# Patient Record
Sex: Female | Born: 1990 | Race: White | Hispanic: No | Marital: Married | Smoking: Never smoker
Health system: Southern US, Community
[De-identification: ages and names within clinical notes are randomized; demographics above are authoritative.]

## PROBLEM LIST (undated history)

## (undated) ENCOUNTER — Inpatient Hospital Stay (HOSPITAL_COMMUNITY): Payer: Self-pay

## (undated) DIAGNOSIS — M726 Necrotizing fasciitis: Secondary | ICD-10-CM

## (undated) DIAGNOSIS — E2749 Other adrenocortical insufficiency: Secondary | ICD-10-CM

## (undated) DIAGNOSIS — Z9151 Personal history of suicidal behavior: Secondary | ICD-10-CM

## (undated) DIAGNOSIS — Z8659 Personal history of other mental and behavioral disorders: Secondary | ICD-10-CM

## (undated) DIAGNOSIS — G43909 Migraine, unspecified, not intractable, without status migrainosus: Secondary | ICD-10-CM

## (undated) DIAGNOSIS — N3010 Interstitial cystitis (chronic) without hematuria: Secondary | ICD-10-CM

## (undated) DIAGNOSIS — F32A Depression, unspecified: Secondary | ICD-10-CM

## (undated) DIAGNOSIS — N390 Urinary tract infection, site not specified: Secondary | ICD-10-CM

## (undated) DIAGNOSIS — M797 Fibromyalgia: Secondary | ICD-10-CM

## (undated) DIAGNOSIS — F4310 Post-traumatic stress disorder, unspecified: Secondary | ICD-10-CM

## (undated) DIAGNOSIS — I10 Essential (primary) hypertension: Secondary | ICD-10-CM

## (undated) DIAGNOSIS — G4700 Insomnia, unspecified: Secondary | ICD-10-CM

## (undated) DIAGNOSIS — N200 Calculus of kidney: Secondary | ICD-10-CM

## (undated) DIAGNOSIS — J45909 Unspecified asthma, uncomplicated: Secondary | ICD-10-CM

## (undated) DIAGNOSIS — E063 Autoimmune thyroiditis: Secondary | ICD-10-CM

## (undated) DIAGNOSIS — F419 Anxiety disorder, unspecified: Secondary | ICD-10-CM

## (undated) HISTORY — DX: Other adrenocortical insufficiency: E27.49

## (undated) HISTORY — DX: Depression, unspecified: F32.A

## (undated) HISTORY — PX: NASAL SEPTUM SURGERY: SHX37

## (undated) HISTORY — DX: Post-traumatic stress disorder, unspecified: F43.10

## (undated) HISTORY — DX: Insomnia, unspecified: G47.00

## (undated) HISTORY — DX: Personal history of suicidal behavior: Z91.51

## (undated) HISTORY — PX: INCISION AND DRAINAGE: SHX5863

## (undated) HISTORY — PX: OTHER SURGICAL HISTORY: SHX169

## (undated) HISTORY — DX: Personal history of other mental and behavioral disorders: Z86.59

## (undated) HISTORY — DX: Fibromyalgia: M79.7

## (undated) HISTORY — PX: APPENDECTOMY: SHX54

## (undated) HISTORY — PX: HAND RECONSTRUCTION: SHX1730

---

## 2015-10-12 ENCOUNTER — Other Ambulatory Visit: Payer: Self-pay | Admitting: Family Medicine

## 2015-10-12 ENCOUNTER — Ambulatory Visit
Admission: RE | Admit: 2015-10-12 | Discharge: 2015-10-12 | Disposition: A | Discharge status: Home / Self Care | Payer: BLUE CROSS/BLUE SHIELD | Source: Ambulatory Visit | Attending: Family Medicine | Admitting: Family Medicine

## 2015-10-12 DIAGNOSIS — R319 Hematuria, unspecified: Secondary | ICD-10-CM

## 2015-10-12 DIAGNOSIS — Z87442 Personal history of urinary calculi: Secondary | ICD-10-CM

## 2015-11-06 ENCOUNTER — Emergency Department (HOSPITAL_COMMUNITY): Payer: BLUE CROSS/BLUE SHIELD

## 2015-11-06 ENCOUNTER — Emergency Department (HOSPITAL_COMMUNITY)
Admission: EM | Admit: 2015-11-06 | Discharge: 2015-11-06 | Disposition: A | Discharge status: Home / Self Care | Payer: BLUE CROSS/BLUE SHIELD | Attending: Emergency Medicine | Admitting: Emergency Medicine

## 2015-11-06 ENCOUNTER — Encounter (HOSPITAL_COMMUNITY): Payer: Self-pay | Admitting: Emergency Medicine

## 2015-11-06 DIAGNOSIS — I10 Essential (primary) hypertension: Secondary | ICD-10-CM | POA: Insufficient documentation

## 2015-11-06 DIAGNOSIS — R300 Dysuria: Secondary | ICD-10-CM

## 2015-11-06 DIAGNOSIS — R102 Pelvic and perineal pain: Secondary | ICD-10-CM | POA: Insufficient documentation

## 2015-11-06 DIAGNOSIS — J45909 Unspecified asthma, uncomplicated: Secondary | ICD-10-CM | POA: Insufficient documentation

## 2015-11-06 HISTORY — DX: Essential (primary) hypertension: I10

## 2015-11-06 HISTORY — DX: Urinary tract infection, site not specified: N39.0

## 2015-11-06 HISTORY — DX: Interstitial cystitis (chronic) without hematuria: N30.10

## 2015-11-06 HISTORY — DX: Migraine, unspecified, not intractable, without status migrainosus: G43.909

## 2015-11-06 HISTORY — DX: Anxiety disorder, unspecified: F41.9

## 2015-11-06 HISTORY — DX: Calculus of kidney: N20.0

## 2015-11-06 HISTORY — DX: Unspecified asthma, uncomplicated: J45.909

## 2015-11-06 LAB — URINALYSIS, ROUTINE W REFLEX MICROSCOPIC
Bilirubin Urine: NEGATIVE
Glucose, UA: NEGATIVE mg/dL
Hgb urine dipstick: NEGATIVE
Ketones, ur: NEGATIVE mg/dL
Leukocytes, UA: NEGATIVE
Nitrite: NEGATIVE
Protein, ur: NEGATIVE mg/dL
Specific Gravity, Urine: 1.01 (ref 1.005–1.030)
pH: 8 (ref 5.0–8.0)

## 2015-11-06 LAB — CBC WITH DIFFERENTIAL/PLATELET
Basophils Absolute: 0 10*3/uL (ref 0.0–0.1)
Basophils Relative: 0 %
Eosinophils Absolute: 0.2 10*3/uL (ref 0.0–0.7)
Eosinophils Relative: 4 %
HCT: 41.8 % (ref 36.0–46.0)
Hemoglobin: 14.5 g/dL (ref 12.0–15.0)
Lymphocytes Relative: 31 %
Lymphs Abs: 1.8 10*3/uL (ref 0.7–4.0)
MCH: 32.4 pg (ref 26.0–34.0)
MCHC: 34.7 g/dL (ref 30.0–36.0)
MCV: 93.5 fL (ref 78.0–100.0)
Monocytes Absolute: 0.6 10*3/uL (ref 0.1–1.0)
Monocytes Relative: 11 %
Neutro Abs: 3 10*3/uL (ref 1.7–7.7)
Neutrophils Relative %: 54 %
Platelets: 280 10*3/uL (ref 150–400)
RBC: 4.47 MIL/uL (ref 3.87–5.11)
RDW: 12.4 % (ref 11.5–15.5)
WBC: 5.6 10*3/uL (ref 4.0–10.5)

## 2015-11-06 LAB — COMPREHENSIVE METABOLIC PANEL
ALT: 15 U/L (ref 14–54)
AST: 25 U/L (ref 15–41)
Albumin: 4.3 g/dL (ref 3.5–5.0)
Alkaline Phosphatase: 50 U/L (ref 38–126)
Anion gap: 8 (ref 5–15)
BUN: 9 mg/dL (ref 6–20)
CO2: 26 mmol/L (ref 22–32)
Calcium: 9.5 mg/dL (ref 8.9–10.3)
Chloride: 107 mmol/L (ref 101–111)
Creatinine, Ser: 0.69 mg/dL (ref 0.44–1.00)
GFR calc Af Amer: >60 mL/min (ref >60)
GFR calc non Af Amer: >60 mL/min (ref >60)
Glucose, Bld: 91 mg/dL (ref 65–99)
Potassium: 3.9 mmol/L (ref 3.5–5.1)
Sodium: 141 mmol/L (ref 135–145)
Total Bilirubin: 0.9 mg/dL (ref 0.3–1.2)
Total Protein: 6.6 g/dL (ref 6.5–8.1)

## 2015-11-06 LAB — POC URINE PREG, ED: Preg Test, Ur: NEGATIVE

## 2015-11-06 LAB — LIPASE, BLOOD: Lipase: 24 U/L (ref 11–51)

## 2015-11-06 MED ORDER — DIPHENHYDRAMINE HCL 50 MG/ML IJ SOLN
25.0000 mg | Freq: Once | INTRAMUSCULAR | Status: AC
  Administered 2015-11-06: 25 mg via INTRAVENOUS
  Filled 2015-11-06: qty 1

## 2015-11-06 MED ORDER — HYDROMORPHONE HCL 1 MG/ML IJ SOLN
1.0000 mg | Freq: Once | INTRAMUSCULAR | Status: AC
  Administered 2015-11-06: 1 mg via INTRAVENOUS
  Filled 2015-11-06: qty 1

## 2015-11-06 MED ORDER — PHENAZOPYRIDINE HCL 100 MG PO TABS
200.0000 mg | ORAL_TABLET | Freq: Once | ORAL | Status: DC
  Filled 2015-11-06: qty 2

## 2015-11-06 MED ORDER — METOCLOPRAMIDE HCL 5 MG/ML IJ SOLN
10.0000 mg | Freq: Once | INTRAMUSCULAR | Status: AC
  Administered 2015-11-06: 10 mg via INTRAVENOUS
  Filled 2015-11-06: qty 2

## 2015-11-06 MED ORDER — SODIUM CHLORIDE 0.9 % IV SOLN
INTRAVENOUS | Status: DC
  Administered 2015-11-06: 19:00:00 via INTRAVENOUS

## 2015-11-06 MED ORDER — OXYCODONE-ACETAMINOPHEN 5-325 MG PO TABS: 1.0000 | ORAL_TABLET | Freq: Four times a day (QID) | ORAL | 0 refills | Status: DC | PRN

## 2015-11-06 NOTE — ED Notes (Signed)
IV team at bedside 

## 2015-11-06 NOTE — ED Notes (Signed)
Pt returned to room from ultrasound, IV team notified

## 2015-11-06 NOTE — Discharge Instructions (Signed)
All of your tests tonight were normal. You will need to follow up with the urologist as soon as possible. Call your PCP tomorrow to discuss follow up.

## 2015-11-06 NOTE — ED Notes (Signed)
IV attempted x1 without success, will have another RN look

## 2015-11-06 NOTE — ED Provider Notes (Signed)
[facility] DEPT Provider Note   CSN: [id number] Arrival date & time: 11/06/15  1620  By signing my name below, I, Krystal [name], attest that this documentation has been prepared under the direction and in the presence of non-physician practitioner, Debroah Baller, NP. Electronically Signed: Jeanell [name], Scribe. 11/06/2015. 4:39 PM  History   Chief Complaint Chief Complaint  Patient presents with  . Dysuria   The history is provided by the patient. No language interpreter was used.  Dysuria   This is a new problem. The current episode started more than 1 week ago. The problem occurs every urination. The problem has been gradually worsening. The quality of the pain is described as burning. The pain is moderate. There has been no fever. Associated symptoms include chills, nausea, vomiting, discharge, frequency and urgency. Pertinent negatives include no possible pregnancy. She has tried nothing for the symptoms.   HPI Comments: Krystal [name] is a 25 y.o. female who presents to the Emergency Department complaining of constant moderate dysuria that started over a month ago. Her worsening symptoms include a burning sensation during urination, intermittent vaginal pain worse after urination, frequency, and urgency. Other associated symptoms include white vaginal discharge, lower abdominal pain, intermittent back pain, nausea, vomiting, and chills. She was dx with interstitial cystitis a month ago at [facility] and is suppose to follow up with a specialist. Reports having a miscarriage a month ago. Denies fever or current pregnancy.   Past Medical History:  Diagnosis Date  . Anxiety   . Asthma   . Hypertension   . IC (interstitial cystitis)   . Kidney stones   . Migraines   . UTI (urinary tract infection)     There are no active problems to display for this patient.   Past Surgical History:  Procedure Laterality Date  . APPENDECTOMY    . 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 HAND SURGERY     right  . NASAL SEPTUM SURGERY      OB History    No data available       Home Medications    Prior to Admission medications   Medication Sig Start Date End Date Taking? Authorizing Provider  ALPRAZolam Duanne Moron) 1 MG tablet Take 0.5-1 mg by mouth 2 (two) times daily as needed for anxiety.   Yes Historical Provider, MD  amLODipine (NORVASC) 10 MG tablet Take 5 mg by mouth at bedtime.   Yes Historical Provider, MD  gabapentin (NEURONTIN) 300 MG capsule Take 300 mg by mouth at bedtime.   Yes Historical Provider, MD  hydrochlorothiazide (HYDRODIURIL) 25 MG tablet Take 25 mg by mouth every evening.   Yes Historical Provider, MD  ondansetron (ZOFRAN) 8 MG tablet Take 8 mg by mouth every 8 (eight) hours as needed for nausea or vomiting.   Yes Historical Provider, MD  prenatal vitamin w/FE, FA (PRENATAL 1 + 1) 27-1 MG TABS tablet Take 1 tablet by mouth daily at 12 noon.   Yes Historical Provider, MD  sertraline (ZOLOFT) 100 MG tablet Take 200 mg by mouth at bedtime.   Yes Historical Provider, MD  oxyCODONE-acetaminophen (PERCOCET/ROXICET) 5-325 MG tablet Take 1 tablet by mouth every 6 (six) hours as needed for severe pain. 11/06/15   [name], NP  pentosan polysulfate (ELMIRON) 100 MG capsule Take 100 mg by mouth 3 (three) times daily.    Historical Provider, MD    Family History History reviewed. No pertinent family history.  Social History Social History  Substance Use Topics  . Smoking status: Never Smoker  . Smokeless tobacco: Never Used  . Alcohol use Yes     Comment: bottle of wine a week     Allergies   Red dye; Compazine [prochlorperazine maleate]; Other; Vicodin [hydrocodone-acetaminophen]; and Wheat bran   Review of Systems Review of Systems  Constitutional: Positive for chills. Negative for fever.  Gastrointestinal: Positive for abdominal pain, nausea and vomiting.  Genitourinary: Positive for dysuria, frequency, urgency and  vaginal discharge (White).  Musculoskeletal: Positive for back pain.  All other systems reviewed and are negative.    Physical Exam Updated Vital Signs BP 132/86 (BP Location: Right Arm)   Pulse 68   Temp 98 F (36.7 C) (Oral)   Resp 18   Ht 5\' 11"  (1.803 m)   Wt 185 lb (83.9 kg)   LMP 10/10/2015   SpO2 99%   BMI 25.80 kg/m   Physical Exam  Constitutional: She appears well-developed and well-nourished. No distress.  HENT:  Head: Normocephalic.  Eyes: Conjunctivae are normal.  Neck: Neck supple.  Cardiovascular: Normal rate and regular rhythm.   Pulmonary/Chest: Effort normal. No respiratory distress. She has no wheezes. She has no rales.  Abdominal: Soft. Bowel sounds are normal. There is tenderness.  Generalized TTP that is increased in the LLQ. No CVA TTP.   Genitourinary:  Genitourinary Comments: Pt declines pelvic exam at this time.   Musculoskeletal: Normal range of motion.  Neurological: She is alert.  Skin: Skin is warm and dry.  Psychiatric: She has a normal mood and affect.  Nursing note and vitals reviewed.    ED Treatments / Results  DIAGNOSTIC STUDIES: Oxygen Saturation is 99% on RA, normal by my interpretation.   COORDINATION OF CARE: 4:44 PM- Pt advised of plan for treatment and pt agrees.  Labs (all labs ordered are listed, but only abnormal results are displayed) Labs Reviewed  WET PREP, GENITAL  URINALYSIS, ROUTINE W REFLEX MICROSCOPIC (NOT AT [facility])  CBC WITH DIFFERENTIAL/PLATELET  COMPREHENSIVE METABOLIC PANEL  LIPASE, BLOOD  POC URINE PREG, ED  GC/CHLAMYDIA PROBE AMP ([place]) NOT AT [facility]    Radiology US Transvaginal Non-ob  Result Date: 11/06/2015 CLINICAL DATA:  25 y/o F; lower back pain with nausea vomiting and diarrhea for 1 month. Negative pregnancy test. EXAM: TRANSABDOMINAL AND TRANSVAGINAL ULTRASOUND OF PELVIS TECHNIQUE: Both transabdominal and transvaginal ultrasound examinations of the pelvis were performed. Transabdominal  technique was performed for global imaging of the pelvis including uterus, ovaries, adnexal regions, and pelvic cul-de-sac. It was necessary to proceed with endovaginal exam following the transabdominal exam to visualize the bilateral ovaries. COMPARISON:  10/12/2015 CT of abdomen and pelvis. FINDINGS: Uterus Measurements: 4.7 x 3.3 x 4.5 cm. No fibroids or other mass visualized. Endometrium Thickness: 3 mm.  No focal abnormality visualized. Right ovary Measurements: 2.4 x 2.1 x 2.1 cm. Benign-appearing follicles. Left ovary Measurements: 2.3 x 3.1 x 2.3 cm. Benign-appearing follicles. Other findings Trace physiologic free fluid. IMPRESSION: Normal pelvic and transvaginal ultrasound for age. Electronically Signed   By: Kristine Garbe M.D.   On: 11/06/2015 18:32   US Pelvis Complete  Result Date: 11/06/2015 CLINICAL DATA:  25 y/o F; lower back pain with nausea vomiting and diarrhea for 1 month. Negative pregnancy test. EXAM: TRANSABDOMINAL AND TRANSVAGINAL ULTRASOUND OF PELVIS TECHNIQUE: Both transabdominal and transvaginal ultrasound examinations of the pelvis were performed. Transabdominal technique was performed for global imaging of the pelvis including uterus, ovaries, adnexal regions,  and pelvic cul-de-sac. It was necessary to proceed with endovaginal exam following the transabdominal exam to visualize the bilateral ovaries. COMPARISON:  10/12/2015 CT of abdomen and pelvis. FINDINGS: Uterus Measurements: 4.7 x 3.3 x 4.5 cm. No fibroids or other mass visualized. Endometrium Thickness: 3 mm.  No focal abnormality visualized. Right ovary Measurements: 2.4 x 2.1 x 2.1 cm. Benign-appearing follicles. Left ovary Measurements: 2.3 x 3.1 x 2.3 cm. Benign-appearing follicles. Other findings Trace physiologic free fluid. IMPRESSION: Normal pelvic and transvaginal ultrasound for age. Electronically Signed   By: Kristine Garbe M.D.   On: 11/06/2015 18:32   US Renal  Result Date:  11/06/2015 CLINICAL DATA:  25 y/o F; increased urinary frequency for 1 month with burning. EXAM: RENAL / URINARY TRACT ULTRASOUND COMPLETE COMPARISON:  10/12/2015 CT of abdomen and pelvis. FINDINGS: Right Kidney: Length: 11.8 cm. Echogenicity within normal limits. No mass or hydronephrosis visualized. 6 mm lesion described on prior CT is not identified on ultrasound. Left Kidney: Length: 11.3 cm. Echogenicity within normal limits. No mass or hydronephrosis visualized. Mild lobulation of contour. Bladder: Appears normal for degree of bladder distention. IMPRESSION: Normal renal ultrasound. Electronically Signed   By: Kristine Garbe M.D.   On: 11/06/2015 18:30    Procedures Procedures (including critical care time)  Medications Ordered in ED Medications  metoCLOPramide (REGLAN) injection 10 mg (10 mg Intravenous Given 11/06/15 1918)  HYDROmorphone (DILAUDID) injection 1 mg (1 mg Intravenous Given 11/06/15 2037)  diphenhydrAMINE (BENADRYL) injection 25 mg (25 mg Intravenous Given 11/06/15 2146)     Initial Impression / Assessment and Plan / ED Course  I have reviewed the triage vital signs and the nursing notes.  Pertinent labs & imaging results that were available during my care of the patient were reviewed by me and considered in my medical decision making (see chart for details).  Clinical Course    Final Clinical Impressions(s) / ED Diagnoses  25 y.o. female with chronic pelvic pain and hx of interstitial cystitis stable for d/c without fever and does not appear toxic. Normal urine and no concern for pyelonephritis or kidney stones at this time. Patient will f/u with with her PCP in the am to discuss further evaluation by urology. Pain under control at d/c.   Final diagnoses:  Pelvic pain in female  Dysuria    New Prescriptions Discharge Medication List as of 11/06/2015  9:49 PM    START taking these medications   Details  oxyCODONE-acetaminophen (PERCOCET/ROXICET) 5-325 MG  tablet Take 1 tablet by mouth every 6 (six) hours as needed for severe pain., Starting Tue 11/06/2015, Print       I personally performed the services described in this documentation, which was scribed in my presence. The recorded information has been reviewed and is accurate.     [address] [place], [place] 11/08/15 [place], MD 11/09/15 [phone]

## 2015-11-06 NOTE — ED Triage Notes (Signed)
Pt from home with c/o possible interstitial cystitis x 1 month with burning during urination and abdominal pain.  Pt reports she was seen by her PCP and treated for UTI but pain is only getting worse.  Pt was seen again and referred to a urologist but has not been called with an appointment time yet.  Pt states she additionally believes she may had a miscarriage at the beginning of Aug but was not seen by anyone to confirm this.  Pt tearful due to pain. NAD, A&O.

## 2015-11-21 ENCOUNTER — Other Ambulatory Visit: Payer: Self-pay | Admitting: Obstetrics and Gynecology

## 2015-11-21 ENCOUNTER — Other Ambulatory Visit (HOSPITAL_COMMUNITY)
Admission: RE | Admit: 2015-11-21 | Discharge: 2015-11-21 | Disposition: A | Discharge status: Home / Self Care | Payer: No Typology Code available for payment source | Source: Ambulatory Visit | Attending: Obstetrics and Gynecology | Admitting: Obstetrics and Gynecology

## 2015-11-21 DIAGNOSIS — Z01419 Encounter for gynecological examination (general) (routine) without abnormal findings: Secondary | ICD-10-CM | POA: Insufficient documentation

## 2015-11-22 LAB — CYTOLOGY - PAP

## 2015-12-18 ENCOUNTER — Encounter (HOSPITAL_COMMUNITY): Payer: Self-pay | Admitting: Emergency Medicine

## 2015-12-18 DIAGNOSIS — Y999 Unspecified external cause status: Secondary | ICD-10-CM | POA: Insufficient documentation

## 2015-12-18 DIAGNOSIS — Y939 Activity, unspecified: Secondary | ICD-10-CM | POA: Insufficient documentation

## 2015-12-18 DIAGNOSIS — Z79899 Other long term (current) drug therapy: Secondary | ICD-10-CM | POA: Insufficient documentation

## 2015-12-18 DIAGNOSIS — S76212A Strain of adductor muscle, fascia and tendon of left thigh, initial encounter: Secondary | ICD-10-CM | POA: Insufficient documentation

## 2015-12-18 DIAGNOSIS — J45909 Unspecified asthma, uncomplicated: Secondary | ICD-10-CM | POA: Insufficient documentation

## 2015-12-18 DIAGNOSIS — W010XXA Fall on same level from slipping, tripping and stumbling without subsequent striking against object, initial encounter: Secondary | ICD-10-CM | POA: Insufficient documentation

## 2015-12-18 DIAGNOSIS — I10 Essential (primary) hypertension: Secondary | ICD-10-CM | POA: Insufficient documentation

## 2015-12-18 DIAGNOSIS — M545 Low back pain: Secondary | ICD-10-CM | POA: Insufficient documentation

## 2015-12-18 DIAGNOSIS — Y92512 Supermarket, store or market as the place of occurrence of the external cause: Secondary | ICD-10-CM | POA: Insufficient documentation

## 2015-12-18 NOTE — ED Triage Notes (Signed)
Pt. slipped and fell this evening inside a store , denies LOC/ambulatory , reports pain at left hip radiating to left groin and lower back .

## 2015-12-19 ENCOUNTER — Emergency Department (HOSPITAL_COMMUNITY): Payer: No Typology Code available for payment source

## 2015-12-19 ENCOUNTER — Emergency Department (HOSPITAL_COMMUNITY)
Admission: EM | Admit: 2015-12-19 | Discharge: 2015-12-19 | Disposition: A | Discharge status: Home / Self Care | Payer: No Typology Code available for payment source | Attending: Emergency Medicine | Admitting: Emergency Medicine

## 2015-12-19 DIAGNOSIS — S76212A Strain of adductor muscle, fascia and tendon of left thigh, initial encounter: Secondary | ICD-10-CM

## 2015-12-19 DIAGNOSIS — M545 Low back pain, unspecified: Secondary | ICD-10-CM

## 2015-12-19 LAB — POC URINE PREG, ED: Preg Test, Ur: NEGATIVE

## 2015-12-19 MED ORDER — OXYCODONE-ACETAMINOPHEN 5-325 MG PO TABS
1.0000 | ORAL_TABLET | Freq: Once | ORAL | Status: AC
  Administered 2015-12-19: 1 via ORAL
  Filled 2015-12-19: qty 1

## 2015-12-19 MED ORDER — KETOROLAC TROMETHAMINE 15 MG/ML IJ SOLN
15.0000 mg | Freq: Once | INTRAMUSCULAR | Status: AC
  Administered 2015-12-19: 15 mg via INTRAMUSCULAR
  Filled 2015-12-19: qty 1

## 2015-12-19 MED ORDER — NAPROXEN 500 MG PO TABS: 500.0000 mg | ORAL_TABLET | Freq: Two times a day (BID) | ORAL | 0 refills | Status: DC

## 2015-12-19 MED ORDER — CYCLOBENZAPRINE HCL 10 MG PO TABS: 10.0000 mg | ORAL_TABLET | Freq: Two times a day (BID) | ORAL | 0 refills | Status: DC | PRN

## 2015-12-19 NOTE — ED Notes (Signed)
Pt verbalized understanding discharge instructions and denies any further needs or questions at this time. VS stable, ambulatory and steady gait.   

## 2015-12-19 NOTE — ED Provider Notes (Signed)
[facility] DEPT Provider Note   CSN: [id number] Arrival date & time: 12/18/15  2206  By signing my name below, I, Delton Prairie, attest that this documentation has been prepared under the direction and in the presence of Merryl Hacker, MD  Electronically Signed: Delton Prairie, ED Scribe. 12/19/15. 12:46 AM.   History   Chief Complaint Chief Complaint  Patient presents with  . Fall    The history is provided by the patient. No language interpreter was used.   HPI Comments:  Krystal [name] is a 25 y.o. female, with a hx of HTN and IC, who presents to the Emergency Department complaining of "8/10" moderate hip pain and lower back pain s/p an incident which occurred ~ 6:08 PM today. She states she fell on a wet floor at a grocery store. Pt notes pain in her R leg when standing for extended period of time. She is ambulatory with discomfort. Pt denies weakness, numbness, tingling in her leg or head injury. She has taken percocet with no relief. Pt is on Zoloft for depression and takes xanax as needed. Pt denies any other complaints at this time.   Past Medical History:  Diagnosis Date  . Anxiety   . Asthma   . Hypertension   . IC (interstitial cystitis)   . Kidney stones   . Migraines   . UTI (urinary tract infection)     There are no active problems to display for this patient.   Past Surgical History:  Procedure Laterality Date  . APPENDECTOMY    . 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 HAND SURGERY     right  . NASAL SEPTUM SURGERY      OB History    No data available       Home Medications    Prior to Admission medications   Medication Sig Start Date End Date Taking? Authorizing Provider  albuterol (PROVENTIL HFA;VENTOLIN HFA) 108 (90 Base) MCG/ACT inhaler Inhale 1 puff into the lungs every 6 (six) hours as needed for wheezing or shortness of breath.   Yes Historical Provider, MD  ALPRAZolam Duanne Moron) 1 MG tablet Take 0.5-1 mg by mouth 2  (two) times daily as needed for anxiety.   Yes Historical Provider, MD  levonorgestrel-ethinyl estradiol (AVIANE,ALESSE,LESSINA) 0.1-20 MG-MCG tablet Take 1 tablet by mouth daily.   Yes Historical Provider, MD  oxyCODONE-acetaminophen (PERCOCET/ROXICET) 5-325 MG tablet Take 1 tablet by mouth every 6 (six) hours as needed for severe pain. 11/06/15  Yes [name], NP  Prenatal Vit-Fe Fumarate-FA (PRENATAL PO) Take 1 tablet by mouth daily.   Yes Historical Provider, MD  sertraline (ZOLOFT) 100 MG tablet Take 200 mg by mouth at bedtime.   Yes Historical Provider, MD  cyclobenzaprine (FLEXERIL) 10 MG tablet Take 1 tablet (10 mg total) by mouth 2 (two) times daily as needed for muscle spasms. 12/19/15   Merryl Hacker, MD  naproxen (NAPROSYN) 500 MG tablet Take 1 tablet (500 mg total) by mouth 2 (two) times daily. 12/19/15   Merryl Hacker, MD    Family History No family history on file.  Social History Social History  Substance Use Topics  . Smoking status: Never Smoker  . Smokeless tobacco: Never Used  . Alcohol use Yes     Comment: bottle of wine a week     Allergies   Red dye; Compazine [prochlorperazine maleate]; Other; Vicodin [hydrocodone-acetaminophen]; and Wheat bran   Review of Systems Review of  Systems  Gastrointestinal: Negative for abdominal pain.  Genitourinary: Negative for difficulty urinating.  Musculoskeletal: Positive for arthralgias and myalgias.  Neurological: Negative for weakness, numbness and headaches.  All other systems reviewed and are negative.    Physical Exam Updated Vital Signs BP (!) 186/111 (BP Location: Left Arm)   Pulse 76   Temp 98.6 F (37 C) (Oral)   Resp 20   Ht 5\' 11"  (1.803 m)   Wt 185 lb (83.9 kg)   SpO2 100%   BMI 25.80 kg/m   Physical Exam  Constitutional: She is oriented to person, place, and time. She appears well-developed and well-nourished.  HENT:  Head: Normocephalic and atraumatic.  Cardiovascular: Normal rate,  regular rhythm and normal heart sounds.   No murmur heard. Pulmonary/Chest: Effort normal and breath sounds normal. No respiratory distress. She has no wheezes.  Abdominal: Soft. There is no tenderness.  Musculoskeletal:  Normal range of motion of the bilateral hips and knees, no obvious deformities, tenderness to palpation left medial groin, no ecchymosis or overlying skin changes, tenderness to palpation midline lower lumbar spine without step off or deformity  Neurological: She is alert and oriented to person, place, and time.  Skin: Skin is warm and dry.  Psychiatric: She has a normal mood and affect.  Nursing note and vitals reviewed.    ED Treatments / Results  DIAGNOSTIC STUDIES:  Oxygen Saturation is 100% on RA, normal by my interpretation.    COORDINATION OF CARE:  12:43 AM Discussed treatment plan with pt at bedside and pt agreed to plan.  Labs (all labs ordered are listed, but only abnormal results are displayed) Labs Reviewed  POC URINE PREG, ED    EKG  EKG Interpretation None       Radiology Dg Lumbar Spine Complete  Result Date: 12/19/2015 CLINICAL DATA:  Moderate hip and low back pain after slip and fall on grocery store floor tonight. EXAM: LUMBAR SPINE - COMPLETE 4+ VIEW COMPARISON:  CT abdomen and pelvis October 12, 2015 FINDINGS: Five non rib-bearing lumbar-type vertebral bodies are intact and aligned with maintenance of the lumbar lordosis. L5 limbus vertebra better characterized on prior CT. Intervertebral disc heights are normal. No destructive bony lesions. No pars interarticularis defects. Sacroiliac joints are symmetric. Included prevertebral and paraspinal soft tissue planes are non-suspicious. IMPRESSION: Negative. Electronically Signed   By: Elon Alas M.D.   On: 12/19/2015 01:53   Dg Hip Unilat W Or Wo Pelvis 2-3 Views Left  Result Date: 12/19/2015 CLINICAL DATA:  Slipped and fell this evening inside store. LEFT hip pain. EXAM: DG HIP  (WITH OR WITHOUT PELVIS) 2-3V LEFT COMPARISON:  None. FINDINGS: Femoral heads are well formed and located. Hip joint spaces are intact. Sacroiliac joints are symmetric. No destructive bony lesions. Included soft tissue planes are non-suspicious. Sub cm heterotopic ossification LEFT lateral pelvis soft tissues. IMPRESSION: Negative. Electronically Signed   By: Elon Alas M.D.   On: 12/19/2015 01:03    Procedures Procedures (including critical care time)  Medications Ordered in ED Medications  oxyCODONE-acetaminophen (PERCOCET/ROXICET) 5-325 MG per tablet 1 tablet (not administered)  oxyCODONE-acetaminophen (PERCOCET/ROXICET) 5-325 MG per tablet 1 tablet (1 tablet Oral Given 12/19/15 0057)  ketorolac (TORADOL) 15 MG/ML injection 15 mg (15 mg Intramuscular Given 12/19/15 0057)     Initial Impression / Assessment and Plan / ED Course  I have reviewed the triage vital signs and the nursing notes.  Pertinent labs & imaging results that were available during my care of  the patient were reviewed by me and considered in my medical decision making (see chart for details).  Clinical Course   Patient presents after a fall. Nontoxic on exam. Left leg pain and back pain. Denies direct contact with the floor but reports straining her groin. No evidence of sciatica. No red flags of back pain. Patient was given pain medication and Toradol. X-rays are negative. Supportive measures at home including NSAIDs, Flexeril, and ice as needed.  After history, exam, and medical workup I feel the patient has been appropriately medically screened and is safe for discharge home. Pertinent diagnoses were discussed with the patient. Patient was given return precautions.   Final Clinical Impressions(s) / ED Diagnoses   Final diagnoses:  Groin strain, left, initial encounter  Acute midline low back pain without sciatica    New Prescriptions New Prescriptions   CYCLOBENZAPRINE (FLEXERIL) 10 MG TABLET    Take 1  tablet (10 mg total) by mouth 2 (two) times daily as needed for muscle spasms.   NAPROXEN (NAPROSYN) 500 MG TABLET    Take 1 tablet (500 mg total) by mouth 2 (two) times daily.   I personally performed the services described in this documentation, which was scribed in my presence. The recorded information has been reviewed and is accurate.     Merryl Hacker, MD 12/19/15 [phone]

## 2016-03-20 ENCOUNTER — Emergency Department (HOSPITAL_COMMUNITY)
Admission: EM | Admit: 2016-03-20 | Discharge: 2016-03-20 | Disposition: A | Discharge status: Home / Self Care | Payer: Self-pay | Attending: Emergency Medicine | Admitting: Emergency Medicine

## 2016-03-20 ENCOUNTER — Encounter (HOSPITAL_COMMUNITY): Payer: Self-pay | Admitting: Emergency Medicine

## 2016-03-20 ENCOUNTER — Emergency Department (HOSPITAL_COMMUNITY): Payer: Self-pay

## 2016-03-20 DIAGNOSIS — J45909 Unspecified asthma, uncomplicated: Secondary | ICD-10-CM | POA: Insufficient documentation

## 2016-03-20 DIAGNOSIS — J111 Influenza due to unidentified influenza virus with other respiratory manifestations: Secondary | ICD-10-CM | POA: Insufficient documentation

## 2016-03-20 DIAGNOSIS — Z79899 Other long term (current) drug therapy: Secondary | ICD-10-CM | POA: Insufficient documentation

## 2016-03-20 DIAGNOSIS — R69 Illness, unspecified: Secondary | ICD-10-CM

## 2016-03-20 DIAGNOSIS — I10 Essential (primary) hypertension: Secondary | ICD-10-CM | POA: Insufficient documentation

## 2016-03-20 LAB — URINALYSIS, ROUTINE W REFLEX MICROSCOPIC
Bacteria, UA: NONE SEEN
Bilirubin Urine: NEGATIVE
Glucose, UA: NEGATIVE mg/dL
Ketones, ur: 5 mg/dL — AB
Leukocytes, UA: NEGATIVE
Nitrite: NEGATIVE
Protein, ur: NEGATIVE mg/dL
Specific Gravity, Urine: 1.005 (ref 1.005–1.030)
pH: 7 (ref 5.0–8.0)

## 2016-03-20 LAB — COMPREHENSIVE METABOLIC PANEL
ALT: 23 U/L (ref 14–54)
AST: 46 U/L — ABNORMAL HIGH (ref 15–41)
Albumin: 4.9 g/dL (ref 3.5–5.0)
Alkaline Phosphatase: 50 U/L (ref 38–126)
Anion gap: 12 (ref 5–15)
BUN: 8 mg/dL (ref 6–20)
CO2: 26 mmol/L (ref 22–32)
Calcium: 9.7 mg/dL (ref 8.9–10.3)
Chloride: 103 mmol/L (ref 101–111)
Creatinine, Ser: 0.81 mg/dL (ref 0.44–1.00)
GFR calc Af Amer: >60 mL/min (ref >60)
GFR calc non Af Amer: >60 mL/min (ref >60)
Glucose, Bld: 90 mg/dL (ref 65–99)
Potassium: 4 mmol/L (ref 3.5–5.1)
Sodium: 141 mmol/L (ref 135–145)
Total Bilirubin: 1 mg/dL (ref 0.3–1.2)
Total Protein: 7.9 g/dL (ref 6.5–8.1)

## 2016-03-20 LAB — CBC WITH DIFFERENTIAL/PLATELET
Basophils Absolute: 0.1 10*3/uL (ref 0.0–0.1)
Basophils Relative: 1 %
Eosinophils Absolute: 0.1 10*3/uL (ref 0.0–0.7)
Eosinophils Relative: 2 %
HCT: 47.6 % — ABNORMAL HIGH (ref 36.0–46.0)
Hemoglobin: 17 g/dL — ABNORMAL HIGH (ref 12.0–15.0)
Lymphocytes Relative: 39 %
Lymphs Abs: 1.8 10*3/uL (ref 0.7–4.0)
MCH: 32.3 pg (ref 26.0–34.0)
MCHC: 35.7 g/dL (ref 30.0–36.0)
MCV: 90.5 fL (ref 78.0–100.0)
Monocytes Absolute: 0.8 10*3/uL (ref 0.1–1.0)
Monocytes Relative: 16 %
Neutro Abs: 1.9 10*3/uL (ref 1.7–7.7)
Neutrophils Relative %: 42 %
Platelets: 202 10*3/uL (ref 150–400)
RBC: 5.26 MIL/uL — ABNORMAL HIGH (ref 3.87–5.11)
RDW: 12 % (ref 11.5–15.5)
WBC: 4.6 10*3/uL (ref 4.0–10.5)

## 2016-03-20 LAB — I-STAT BETA HCG BLOOD, ED (MC, WL, AP ONLY): I-stat hCG, quantitative: <5 m[IU]/mL (ref <5)

## 2016-03-20 LAB — I-STAT CG4 LACTIC ACID, ED: Lactic Acid, Venous: 1.35 mmol/L (ref 0.5–1.9)

## 2016-03-20 MED ORDER — DIPHENHYDRAMINE HCL 25 MG PO CAPS
50.0000 mg | ORAL_CAPSULE | Freq: Once | ORAL | Status: DC
  Filled 2016-03-20: qty 2

## 2016-03-20 MED ORDER — SODIUM CHLORIDE 0.9 % IV BOLUS (SEPSIS)
1000.0000 mL | Freq: Once | INTRAVENOUS | Status: AC
Start: 2016-03-20 — End: 2016-03-20
  Administered 2016-03-20: 1000 mL via INTRAVENOUS

## 2016-03-20 MED ORDER — ONDANSETRON HCL 4 MG/2ML IJ SOLN
4.0000 mg | Freq: Once | INTRAMUSCULAR | Status: AC
  Administered 2016-03-20: 4 mg via INTRAVENOUS
  Filled 2016-03-20: qty 2

## 2016-03-20 MED ORDER — KETOROLAC TROMETHAMINE 30 MG/ML IJ SOLN
30.0000 mg | Freq: Once | INTRAMUSCULAR | Status: AC
  Administered 2016-03-20: 30 mg via INTRAVENOUS
  Filled 2016-03-20: qty 1

## 2016-03-20 MED ORDER — SODIUM CHLORIDE 0.9 % IV BOLUS (SEPSIS)
1000.0000 mL | Freq: Once | INTRAVENOUS | Status: AC
  Administered 2016-03-20: 1000 mL via INTRAVENOUS

## 2016-03-20 NOTE — ED Notes (Signed)
Pt had 30ml of Urine on Bladder Scanner

## 2016-03-20 NOTE — ED Provider Notes (Signed)
[facility] DEPT Provider Note   CSN: [id number] Arrival date & time: 03/20/16  1525  By signing my name below, I, Ryan Long, attest that this documentation has been prepared under the direction and in the presence of Forde Dandy, MD . Electronically Signed: Vergia Alcon, Scribe. 03/20/2016. 5:49 PM.  History   Chief Complaint Chief Complaint  Patient presents with  . Influenza  . Emesis    HPI HPI Comments: Krystal [name] is a 26 y.o. female with a PMHx of interstitial cystitis, who presents to the Emergency Department complaining of intermittent episodes of diarrhea and vomiting that began 2 days ago. Pt has had decreased PO intake since the onset of her symptoms secondary to nausea/vomiting. Associated symptoms include of SOB, cough, congestion, generalized myalgias, rhinorrhea, sore throat, generalized weakness. She additionally notes that she had a fever, however, this resolved at home two days ago after taking antipyretics and this has not returned since. She additionally notes that she has experienced pain and burning in the suprapubic abdomen and urethra, but this feels consistent to her flare-ups of interstitial cystitis. Pt has been taking Azo pills at home without relief of this. NO sick contacts with similar symptoms. No other associated symptoms or complaints at this time.   The history is provided by the patient. No language interpreter was used.   Past Medical History:  Diagnosis Date  . Anxiety   . Asthma   . Hypertension   . IC (interstitial cystitis)   . Kidney stones   . Migraines   . UTI (urinary tract infection)     There are no active problems to display for this patient.   Past Surgical History:  Procedure Laterality Date  . APPENDECTOMY    . 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 HAND SURGERY     right  . NASAL SEPTUM SURGERY      OB History    No data available       Home Medications    Prior to Admission medications     Medication Sig Start Date End Date Taking? Authorizing Provider  albuterol (PROVENTIL HFA;VENTOLIN HFA) 108 (90 Base) MCG/ACT inhaler Inhale 1 puff into the lungs every 6 (six) hours as needed for wheezing or shortness of breath.    Historical Provider, MD  ALPRAZolam Duanne Moron) 1 MG tablet Take 0.5-1 mg by mouth 2 (two) times daily as needed for anxiety.    Historical Provider, MD  cyclobenzaprine (FLEXERIL) 10 MG tablet Take 1 tablet (10 mg total) by mouth 2 (two) times daily as needed for muscle spasms. 12/19/15   Merryl Hacker, MD  levonorgestrel-ethinyl estradiol (AVIANE,ALESSE,LESSINA) 0.1-20 MG-MCG tablet Take 1 tablet by mouth daily.    Historical Provider, MD  naproxen (NAPROSYN) 500 MG tablet Take 1 tablet (500 mg total) by mouth 2 (two) times daily. 12/19/15   Merryl Hacker, MD  oxyCODONE-acetaminophen (PERCOCET/ROXICET) 5-325 MG tablet Take 1 tablet by mouth every 6 (six) hours as needed for severe pain. 11/06/15   [name], NP  Prenatal Vit-Fe Fumarate-FA (PRENATAL PO) Take 1 tablet by mouth daily.    Historical Provider, MD  sertraline (ZOLOFT) 100 MG tablet Take 200 mg by mouth at bedtime.    Historical Provider, MD    Family History No family history on file.  Social History Social History  Substance Use Topics  . Smoking status: Never Smoker  . Smokeless tobacco: Never Used  . Alcohol use Yes  Comment: bottle of wine a week     Allergies   Red dye; Compazine [prochlorperazine maleate]; Other; Vicodin [hydrocodone-acetaminophen]; and Wheat bran   Review of Systems Review of Systems 10/14 systems reviewed and are negative other than those stated in the HPI  Physical Exam Updated Vital Signs BP 135/92   Pulse 77   Temp 98.4 F (36.9 C) (Oral)   Resp 14   Ht 5\' 11"  (1.803 m)   Wt 183 lb (83 kg)   LMP 03/20/2016   SpO2 100%   BMI 25.52 kg/m   Physical Exam Physical Exam  Nursing note and vitals reviewed. Constitutional: non-toxic, and in no  acute distress. Appears low energy and fatigued. Head: Normocephalic and atraumatic.  Mouth/Throat: Oropharynx is clear. Dry mucous membranes.  Neck: Normal range of motion. Neck supple.  Cardiovascular: Normal rate and regular rhythm.   Pulmonary/Chest: Effort normal and breath sounds normal.  Abdominal: Soft. There is no tenderness. There is no rebound and no guarding.  Musculoskeletal: Normal range of motion.  Neurological: Alert, no facial droop, fluent speech, moves all extremities symmetrically Skin: Skin is warm and dry.  Psychiatric: Cooperative  ED Treatments / Results  DIAGNOSTIC STUDIES:  Oxygen Saturation is 100% on RA, normal by my interpretation.    COORDINATION OF CARE:  5:24 PM Discussed treatment plan of IV fluids, antiemetics, pain medication, and risk vs benefit of Tamiflu with pt at bedside and pt agreed to plan.  Labs (all labs ordered are listed, but only abnormal results are displayed) Labs Reviewed  COMPREHENSIVE METABOLIC PANEL - Abnormal; Notable for the following:       Result Value   AST 46 (*)    All other components within normal limits  CBC WITH DIFFERENTIAL/PLATELET - Abnormal; Notable for the following:    RBC 5.26 (*)    Hemoglobin 17.0 (*)    HCT 47.6 (*)    All other components within normal limits  URINALYSIS, ROUTINE W REFLEX MICROSCOPIC - Abnormal; Notable for the following:    Color, Urine COLORLESS (*)    Hgb urine dipstick MODERATE (*)    Ketones, ur 5 (*)    Squamous Epithelial / LPF 0-5 (*)    All other components within normal limits  URINE CULTURE  I-STAT CG4 LACTIC ACID, ED  I-STAT BETA HCG BLOOD, ED ([facility], [facility], [facility] ONLY)    EKG  EKG Interpretation None       Radiology Dg Chest 2 View  Result Date: 03/20/2016 CLINICAL DATA:  Flu like symptoms. EXAM: CHEST  2 VIEW COMPARISON:  None. FINDINGS: The heart size and mediastinal contours are within normal limits. Both lungs are clear. The visualized skeletal structures are  unremarkable. IMPRESSION: No active cardiopulmonary disease. Electronically Signed   By: Misty Stanley M.D.   On: 03/20/2016 16:20    Procedures Procedures   Medications Ordered in ED Medications  diphenhydrAMINE (BENADRYL) capsule 50 mg (50 mg Oral Not Given 03/20/16 1827)  sodium chloride 0.9 % bolus 1,000 mL (0 mLs Intravenous Stopped 03/20/16 1930)  ondansetron (ZOFRAN) injection 4 mg (4 mg Intravenous Given 03/20/16 1751)  sodium chloride 0.9 % bolus 1,000 mL (0 mLs Intravenous Stopped 03/20/16 1930)  ketorolac (TORADOL) 30 MG/ML injection 30 mg (30 mg Intravenous Given 03/20/16 1751)     Initial Impression / Assessment and Plan / ED Course  I have reviewed the triage vital signs and the nursing notes.  Pertinent labs & imaging results that were available during my care of  the patient were reviewed by me and considered in my medical decision making (see chart for details).     Presenting with influenza like illness. Non-toxic and in no acute distress. Vital signs stable. Appears dry on exam. Blood work from triage reassuring. CXR visualized and shows no infiltrate or other acute cardiopulmonary processes. Presentation seens consistent with viral illness. UA w/o infection, presentation consistent with interstitial cystitis flare up as well.   Patient stable for outpatient supportive care management. Strict return and follow-up instructions reviewed. She expressed understanding of all discharge instructions and felt comfortable with the plan of care.   Final Clinical Impressions(s) / ED Diagnoses   Final diagnoses:  Influenza-like illness    New Prescriptions New Prescriptions   No medications on file   I personally performed the services described in this documentation, which was scribed in my presence. The recorded information has been reviewed and is accurate.     Forde Dandy, MD 03/20/16 2045

## 2016-03-20 NOTE — ED Triage Notes (Signed)
Pt c/o flu like symptoms since Saturday and diarrhea and vomiting that began on Tuesday. Pt reports she has not been able to keep any liquids down.

## 2016-03-20 NOTE — Discharge Instructions (Signed)
Drink plenty of fluids and get plenty of rest.  Your work-up today is reassuring. This is likely a viral illness.   Please return for worsening symptoms, including new fevers, intractable vomiting, confusion, or any other symptoms concerning to you.

## 2016-03-22 LAB — URINE CULTURE

## 2017-01-16 ENCOUNTER — Emergency Department (HOSPITAL_COMMUNITY)
Admission: EM | Admit: 2017-01-16 | Discharge: 2017-01-16 | Disposition: A | Discharge status: Home / Self Care | Payer: No Typology Code available for payment source | Attending: Emergency Medicine | Admitting: Emergency Medicine

## 2017-01-16 ENCOUNTER — Encounter (HOSPITAL_COMMUNITY): Payer: Self-pay

## 2017-01-16 ENCOUNTER — Other Ambulatory Visit: Payer: Self-pay

## 2017-01-16 DIAGNOSIS — J45909 Unspecified asthma, uncomplicated: Secondary | ICD-10-CM | POA: Insufficient documentation

## 2017-01-16 DIAGNOSIS — Z79899 Other long term (current) drug therapy: Secondary | ICD-10-CM | POA: Insufficient documentation

## 2017-01-16 DIAGNOSIS — N760 Acute vaginitis: Secondary | ICD-10-CM | POA: Insufficient documentation

## 2017-01-16 DIAGNOSIS — I10 Essential (primary) hypertension: Secondary | ICD-10-CM | POA: Insufficient documentation

## 2017-01-16 DIAGNOSIS — N939 Abnormal uterine and vaginal bleeding, unspecified: Secondary | ICD-10-CM | POA: Insufficient documentation

## 2017-01-16 DIAGNOSIS — B9689 Other specified bacterial agents as the cause of diseases classified elsewhere: Secondary | ICD-10-CM

## 2017-01-16 LAB — COMPREHENSIVE METABOLIC PANEL
ALT: 16 U/L (ref 14–54)
AST: 24 U/L (ref 15–41)
Albumin: 4.2 g/dL (ref 3.5–5.0)
Alkaline Phosphatase: 44 U/L (ref 38–126)
Anion gap: 8 (ref 5–15)
BUN: 18 mg/dL (ref 6–20)
CO2: 24 mmol/L (ref 22–32)
Calcium: 9.4 mg/dL (ref 8.9–10.3)
Chloride: 106 mmol/L (ref 101–111)
Creatinine, Ser: 0.8 mg/dL (ref 0.44–1.00)
GFR calc Af Amer: >60 mL/min (ref >60)
GFR calc non Af Amer: >60 mL/min (ref >60)
Glucose, Bld: 94 mg/dL (ref 65–99)
Potassium: 3.6 mmol/L (ref 3.5–5.1)
Sodium: 138 mmol/L (ref 135–145)
Total Bilirubin: 0.5 mg/dL (ref 0.3–1.2)
Total Protein: 7 g/dL (ref 6.5–8.1)

## 2017-01-16 LAB — CBC WITH DIFFERENTIAL/PLATELET
Basophils Absolute: 0 10*3/uL (ref 0.0–0.1)
Basophils Relative: 0 %
Eosinophils Absolute: 0.3 10*3/uL (ref 0.0–0.7)
Eosinophils Relative: 4 %
HCT: 40.7 % (ref 36.0–46.0)
Hemoglobin: 14.4 g/dL (ref 12.0–15.0)
Lymphocytes Relative: 38 %
Lymphs Abs: 2.5 10*3/uL (ref 0.7–4.0)
MCH: 33.3 pg (ref 26.0–34.0)
MCHC: 35.4 g/dL (ref 30.0–36.0)
MCV: 94 fL (ref 78.0–100.0)
Monocytes Absolute: 0.7 10*3/uL (ref 0.1–1.0)
Monocytes Relative: 10 %
Neutro Abs: 3.2 10*3/uL (ref 1.7–7.7)
Neutrophils Relative %: 48 %
Platelets: 270 10*3/uL (ref 150–400)
RBC: 4.33 MIL/uL (ref 3.87–5.11)
RDW: 11.9 % (ref 11.5–15.5)
WBC: 6.6 10*3/uL (ref 4.0–10.5)

## 2017-01-16 LAB — URINALYSIS, ROUTINE W REFLEX MICROSCOPIC
Bilirubin Urine: NEGATIVE
Glucose, UA: NEGATIVE mg/dL
Ketones, ur: NEGATIVE mg/dL
Leukocytes, UA: NEGATIVE
Nitrite: NEGATIVE
Protein, ur: NEGATIVE mg/dL
Specific Gravity, Urine: 1.005 (ref 1.005–1.030)
pH: 6 (ref 5.0–8.0)

## 2017-01-16 LAB — TYPE AND SCREEN
ABO/RH(D): O POS
Antibody Screen: NEGATIVE

## 2017-01-16 LAB — WET PREP, GENITAL
Sperm: NONE SEEN
Trich, Wet Prep: NONE SEEN
Yeast Wet Prep HPF POC: NONE SEEN

## 2017-01-16 LAB — I-STAT BETA HCG BLOOD, ED (MC, WL, AP ONLY): I-stat hCG, quantitative: <5 m[IU]/mL (ref <5)

## 2017-01-16 MED ORDER — ONDANSETRON HCL 4 MG/2ML IJ SOLN
4.0000 mg | Freq: Once | INTRAMUSCULAR | Status: AC
  Administered 2017-01-16: 4 mg via INTRAVENOUS
  Filled 2017-01-16: qty 2

## 2017-01-16 MED ORDER — METRONIDAZOLE 500 MG PO TABS: 500.0000 mg | ORAL_TABLET | Freq: Two times a day (BID) | ORAL | 0 refills | Status: DC

## 2017-01-16 MED ORDER — SODIUM CHLORIDE 0.9 % IV BOLUS (SEPSIS)
500.0000 mL | Freq: Once | INTRAVENOUS | Status: AC
  Administered 2017-01-16: 500 mL via INTRAVENOUS

## 2017-01-16 MED ORDER — ACETAMINOPHEN 325 MG PO TABS
650.0000 mg | ORAL_TABLET | Freq: Once | ORAL | Status: AC
  Administered 2017-01-16: 650 mg via ORAL
  Filled 2017-01-16: qty 2

## 2017-01-16 NOTE — ED Notes (Signed)
IV was placed in left bicep by ultrasound prior to this RNs arrival. Was a 20ga. The catheter was intact upon removal and site is clean, dry, and intact after removal.

## 2017-01-16 NOTE — Discharge Instructions (Signed)
You have pending STD testing. Please log on to MyChart to see results in about 3 days.

## 2017-01-16 NOTE — ED Provider Notes (Signed)
[facility] DEPT Provider Note   CSN: [id number] Arrival date & time: 01/16/17  1704     History   Chief Complaint Chief Complaint  Patient presents with  . Vaginal Bleeding    HPI Krystal [name] is a 27 y.o. female.  Krystal [name] is a 26 y.o. Female who presents to the ED complaining of vaginal bleeding for the past 3 days.  Reports she began having some vaginal bleeding about 3 days ago that then improved yesterday.  She reports that the bleeding began again today and she has used 6 pads prior to arrival today.  She tells me her last menstrual cycle was 12/28/2016.  She is about 2 weeks early for this to be a menstrual cycle.  She tells me that she is sexually active with her husband and does not use protection.  She reports about 3 days ago she was having some dysuria, that she reports is likely related to interstitial cystitis.  Patient also complains of some suprapubic abdominal pain that is intermittent.  She reports when she has a wave of pain she will vomit.  She has vomited today.  No diarrhea.  She denies fevers, lightheadedness, dizziness, shortness of breath, difficulty urinating, hematemesis, chest pain, coughing or rashes.   The history is provided by the patient, medical records and the spouse. No language interpreter was used.  Vaginal Bleeding  Primary symptoms include pelvic pain, dysuria, and vaginal bleeding. Associated symptoms include abdominal pain, nausea and vomiting. Pertinent negatives include no constipation, no diarrhea, no frequency and no light-headedness.    Past Medical History:  Diagnosis Date  . Anxiety   . Asthma   . Hypertension   . IC (interstitial cystitis)   . Kidney stones   . Migraines   . UTI (urinary tract infection)     There are no active problems to display for this patient.   Past Surgical History:  Procedure Laterality Date  . APPENDECTOMY    . 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 HAND SURGERY     right  . NASAL SEPTUM SURGERY      OB History    No data available       Home Medications    Prior to Admission medications   Medication Sig Start Date End Date Taking? Authorizing Provider  albuterol (PROVENTIL HFA;VENTOLIN HFA) 108 (90 Base) MCG/ACT inhaler Inhale 1 puff into the lungs every 6 (six) hours as needed for wheezing or shortness of breath.   Yes [provider]  ALPRAZolam Duanne Moron) 1 MG tablet Take 1 mg by mouth 2 (two) times daily as needed for anxiety.    Yes [provider]  Prenatal Vit-Fe Fumarate-FA (PRENATAL PO) Take 1 tablet by mouth daily.   Yes [provider]  traMADol (ULTRAM) 50 MG tablet Take 50-100 mg by mouth every 6 (six) hours as needed for moderate pain.   Yes [provider]  cyclobenzaprine (FLEXERIL) 10 MG tablet Take 1 tablet (10 mg total) by mouth 2 (two) times daily as needed for muscle spasms. Patient not taking: Reported on 01/16/2017 12/19/15   Horton, Barbette Hair, MD  levonorgestrel-ethinyl estradiol (AVIANE,ALESSE,LESSINA) 0.1-20 MG-MCG tablet Take 1 tablet by mouth daily.    [provider]  metroNIDAZOLE (FLAGYL) 500 MG tablet Take 1 tablet (500 mg total) by mouth 2 (two) times daily. 01/16/17   Waynetta Pean, PA-C  naproxen (NAPROSYN) 500 MG tablet Take 1 tablet (  500 mg total) by mouth 2 (two) times daily. Patient not taking: Reported on 01/16/2017 12/19/15   Horton, Barbette Hair, MD  oxyCODONE-acetaminophen (PERCOCET/ROXICET) 5-325 MG tablet Take 1 tablet by mouth every 6 (six) hours as needed for severe pain. Patient not taking: Reported on 01/16/2017 11/06/15   Ashley Murrain, NP  sertraline (ZOLOFT) 100 MG tablet Take 200 mg by mouth at bedtime.    [provider]    Family History Family History  Problem Relation Age of Onset  . Hypertension Mother   . Hypertension Father     Social History Social History   Tobacco Use  . Smoking status:  Never Smoker  . Smokeless tobacco: Never Used  Substance Use Topics  . Alcohol use: Yes    Comment: bottle of wine a week  . Drug use: No     Allergies   Red dye; Compazine [prochlorperazine maleate]; Other; Vicodin [hydrocodone-acetaminophen]; and Wheat bran   Review of Systems Review of Systems  Constitutional: Negative for chills and fever.  HENT: Negative for congestion and sore throat.   Eyes: Negative for visual disturbance.  Respiratory: Negative for cough and shortness of breath.   Cardiovascular: Negative for chest pain.  Gastrointestinal: Positive for abdominal pain, nausea and vomiting. Negative for blood in stool, constipation and diarrhea.  Genitourinary: Positive for dysuria, pelvic pain and vaginal bleeding. Negative for difficulty urinating, frequency and hematuria.  Musculoskeletal: Negative for back pain and neck pain.  Skin: Negative for rash.  Neurological: Negative for syncope, weakness, light-headedness and headaches.     Physical Exam Updated Vital Signs BP (!) 148/114 (BP Location: Right Arm)   Pulse 81   Temp 97.9 F (36.6 C) (Oral)   Resp 20   Ht 5' 11.5" (1.816 m)   Wt 94.8 kg (209 lb)   LMP 01/16/2017   SpO2 100%   BMI 28.74 kg/m   Physical Exam  Constitutional: She appears well-developed and well-nourished. No distress.  Nontoxic appearing.  HENT:  Head: Normocephalic and atraumatic.  Mouth/Throat: Oropharynx is clear and moist.  Eyes: Conjunctivae are normal. Pupils are equal, round, and reactive to light. Right eye exhibits no discharge. Left eye exhibits no discharge.  Neck: Neck supple.  Cardiovascular: Normal rate, regular rhythm, normal heart sounds and intact distal pulses.  Pulmonary/Chest: Effort normal and breath sounds normal. No respiratory distress. She has no wheezes. She has no rales.  Abdominal: Soft. Bowel sounds are normal. She exhibits no distension and no mass. There is tenderness. There is no rebound and no  guarding.  Abdomen is soft.  Bowel sounds are present.  Patient has generalized lower abdominal tenderness to palpation.   Genitourinary:  Genitourinary Comments: Exam with female nurse tech as chaperone.  No external lesions or rashes noted.  Scant amount of blood in her vaginal vault.  Cervix is closed.  No cervical motion tenderness.  Patient has mild generalized tenderness across her bilateral pelvic region.  Musculoskeletal: She exhibits no edema.  Lymphadenopathy:    She has no cervical adenopathy.  Neurological: She is alert. Coordination normal.  Skin: Skin is warm and dry. No rash noted. She is not diaphoretic. No erythema. No pallor.  Psychiatric: She has a normal mood and affect. Her behavior is normal.  Nursing note and vitals reviewed.    ED Treatments / Results  Labs (all labs ordered are listed, but only abnormal results are displayed) Labs Reviewed  WET PREP, GENITAL - Abnormal; Notable for the following components:  Result Value   Clue Cells Wet Prep HPF POC PRESENT (*)    WBC, Wet Prep HPF POC FEW (*)    All other components within normal limits  URINALYSIS, ROUTINE W REFLEX MICROSCOPIC - Abnormal; Notable for the following components:   Color, Urine STRAW (*)    Hgb urine dipstick LARGE (*)    Bacteria, UA RARE (*)    Squamous Epithelial / LPF 0-5 (*)    All other components within normal limits  COMPREHENSIVE METABOLIC PANEL  CBC WITH DIFFERENTIAL/PLATELET  RPR  HIV ANTIBODY (ROUTINE TESTING)  I-STAT BETA HCG BLOOD, ED ([facility], [facility], [facility] ONLY)  TYPE AND SCREEN  ABO/RH  GC/CHLAMYDIA PROBE AMP ([facility]) NOT AT [facility]    EKG  EKG Interpretation None       Radiology No results found.  Procedures Procedures (including critical care time)  Medications Ordered in ED Medications  sodium chloride 0.9 % bolus 500 mL (0 mLs Intravenous Stopped 01/16/17 1909)  ondansetron (ZOFRAN) injection 4 mg (4 mg Intravenous Given 01/16/17 1828)  acetaminophen  (TYLENOL) tablet 650 mg (650 mg Oral Given 01/16/17 2000)     Initial Impression / Assessment and Plan / ED Course  I have reviewed the triage vital signs and the nursing notes.  Pertinent labs & imaging results that were available during my care of the patient were reviewed by me and considered in my medical decision making (see chart for details).    This  is a 26 y.o. Female who presents to the ED complaining of vaginal bleeding for the past 3 days.  Reports she began having some vaginal bleeding about 3 days ago that then improved yesterday.  She reports that the bleeding began again today and she has used 6 pads prior to arrival today.  She tells me her last menstrual cycle was 12/28/2016.  She is about 2 weeks early for this to be a menstrual cycle.  She tells me that she is sexually active with her husband and does not use protection.  She reports about 3 days ago she was having some dysuria, that she reports is likely related to interstitial cystitis.  Patient also complains of some suprapubic abdominal pain that is intermittent.  On exam the patient is afebrile nontoxic-appearing.  Her abdomen is soft and she has mild generalized lower abdominal tenderness to palpation without focal tenderness.  Pelvic exam she is a scant amount of vaginal bleeding.  Cervix is closed.  No cervical motion tenderness. Pregnancy test is negative. Urinalysis is without sign of infection. CMP and CBC are unremarkable.  No anemia. Wet prep shows clue cells and few white blood cells.  I discussed this with the patient and we will treat with Flagyl for bacterial vaginosis.  Will wait on STD treatment for return of pending STD testing.  She was encouraged to follow-up on results in about 3 days. Patient without signs of anemia.  Workup here is reassuring.  She is having minimal vaginal bleeding and no symptoms of anemia.  Will discharge with close follow-up by her OB/GYN.  I discussed strict and specific return  precautions such as worsening bleeding and signs of anemia.  Patient agrees with plan. I advised the patient to follow-up with their primary care provider this week. I advised the patient to return to the emergency department with new or worsening symptoms or new concerns. The patient verbalized understanding and agreement with plan.     Final Clinical Impressions(s) / ED Diagnoses   Final  diagnoses:  Vaginal bleeding  BV (bacterial vaginosis)    ED Discharge Orders        Ordered    metroNIDAZOLE (FLAGYL) 500 MG tablet  2 times daily     01/16/17 2023       Waynetta Pean, PA-C 01/16/17 2029    Dorie Rank, MD 01/17/17 [phone]

## 2017-01-16 NOTE — ED Triage Notes (Signed)
Patient c/o heavy vaginal bleeding 3 days ago. Stopped and started again last night. Patient c/o pressure above her tailbone and pelvic area.

## 2017-01-17 LAB — HIV ANTIBODY (ROUTINE TESTING W REFLEX): HIV Screen 4th Generation wRfx: NONREACTIVE

## 2017-01-17 LAB — RPR: RPR Ser Ql: NONREACTIVE

## 2017-01-17 LAB — ABO/RH: ABO/RH(D): O POS

## 2017-01-19 LAB — GC/CHLAMYDIA PROBE AMP (~~LOC~~) NOT AT ARMC
Chlamydia: NEGATIVE
Neisseria Gonorrhea: NEGATIVE

## 2017-07-18 ENCOUNTER — Emergency Department (HOSPITAL_COMMUNITY)
Admission: EM | Admit: 2017-07-18 | Discharge: 2017-07-18 | Disposition: A | Discharge status: Home / Self Care | Payer: Self-pay | Attending: Emergency Medicine | Admitting: Emergency Medicine

## 2017-07-18 ENCOUNTER — Encounter (HOSPITAL_COMMUNITY): Payer: Self-pay

## 2017-07-18 ENCOUNTER — Emergency Department (HOSPITAL_COMMUNITY): Payer: Self-pay

## 2017-07-18 DIAGNOSIS — I10 Essential (primary) hypertension: Secondary | ICD-10-CM | POA: Insufficient documentation

## 2017-07-18 DIAGNOSIS — Z3A01 Less than 8 weeks gestation of pregnancy: Secondary | ICD-10-CM | POA: Insufficient documentation

## 2017-07-18 DIAGNOSIS — O219 Vomiting of pregnancy, unspecified: Secondary | ICD-10-CM | POA: Insufficient documentation

## 2017-07-18 DIAGNOSIS — J45909 Unspecified asthma, uncomplicated: Secondary | ICD-10-CM | POA: Insufficient documentation

## 2017-07-18 DIAGNOSIS — Z79899 Other long term (current) drug therapy: Secondary | ICD-10-CM | POA: Insufficient documentation

## 2017-07-18 LAB — URINALYSIS, ROUTINE W REFLEX MICROSCOPIC
Bilirubin Urine: NEGATIVE
Glucose, UA: NEGATIVE mg/dL
Hgb urine dipstick: NEGATIVE
Ketones, ur: 80 mg/dL — AB
Nitrite: NEGATIVE
Protein, ur: 100 mg/dL — AB
Specific Gravity, Urine: 1.024 (ref 1.005–1.030)
pH: 8 (ref 5.0–8.0)

## 2017-07-18 LAB — COMPREHENSIVE METABOLIC PANEL
ALT: 33 U/L (ref 14–54)
AST: 25 U/L (ref 15–41)
Albumin: 4.7 g/dL (ref 3.5–5.0)
Alkaline Phosphatase: 50 U/L (ref 38–126)
Anion gap: 11 (ref 5–15)
BUN: 9 mg/dL (ref 6–20)
CO2: 24 mmol/L (ref 22–32)
Calcium: 10 mg/dL (ref 8.9–10.3)
Chloride: 104 mmol/L (ref 101–111)
Creatinine, Ser: 0.77 mg/dL (ref 0.44–1.00)
GFR calc Af Amer: >60 mL/min (ref >60)
GFR calc non Af Amer: >60 mL/min (ref >60)
Glucose, Bld: 114 mg/dL — ABNORMAL HIGH (ref 65–99)
Potassium: 4.1 mmol/L (ref 3.5–5.1)
Sodium: 139 mmol/L (ref 135–145)
Total Bilirubin: 1 mg/dL (ref 0.3–1.2)
Total Protein: 7.7 g/dL (ref 6.5–8.1)

## 2017-07-18 LAB — CBC WITH DIFFERENTIAL/PLATELET
Abs Immature Granulocytes: 0 10*3/uL (ref 0.0–0.1)
Basophils Absolute: 0 10*3/uL (ref 0.0–0.1)
Basophils Relative: 0 %
Eosinophils Absolute: 0 10*3/uL (ref 0.0–0.7)
Eosinophils Relative: 0 %
HCT: 45 % (ref 36.0–46.0)
Hemoglobin: 15.6 g/dL — ABNORMAL HIGH (ref 12.0–15.0)
Immature Granulocytes: 0 %
Lymphocytes Relative: 13 %
Lymphs Abs: 1.4 10*3/uL (ref 0.7–4.0)
MCH: 31.8 pg (ref 26.0–34.0)
MCHC: 34.7 g/dL (ref 30.0–36.0)
MCV: 91.8 fL (ref 78.0–100.0)
Monocytes Absolute: 0.5 10*3/uL (ref 0.1–1.0)
Monocytes Relative: 4 %
Neutro Abs: 9.1 10*3/uL — ABNORMAL HIGH (ref 1.7–7.7)
Neutrophils Relative %: 83 %
Platelets: 314 10*3/uL (ref 150–400)
RBC: 4.9 MIL/uL (ref 3.87–5.11)
RDW: 11.7 % (ref 11.5–15.5)
WBC: 11.1 10*3/uL — ABNORMAL HIGH (ref 4.0–10.5)

## 2017-07-18 LAB — HCG, QUANTITATIVE, PREGNANCY: hCG, Beta Chain, Quant, S: 35845 m[IU]/mL — ABNORMAL HIGH (ref <5)

## 2017-07-18 MED ORDER — ONDANSETRON HCL 4 MG/2ML IJ SOLN
4.0000 mg | Freq: Once | INTRAMUSCULAR | Status: AC
  Administered 2017-07-18: 4 mg via INTRAVENOUS
  Filled 2017-07-18: qty 2

## 2017-07-18 MED ORDER — ONDANSETRON 4 MG PO TBDP: 4.0000 mg | ORAL_TABLET | Freq: Three times a day (TID) | ORAL | 0 refills | Status: DC | PRN

## 2017-07-18 MED ORDER — VITAMIN B-6 25 MG PO TABS
25.0000 mg | ORAL_TABLET | Freq: Once | ORAL | Status: AC
  Administered 2017-07-18: 25 mg via ORAL
  Filled 2017-07-18: qty 1

## 2017-07-18 MED ORDER — VITAMIN B-6 50 MG PO TABS: 50.0000 mg | ORAL_TABLET | Freq: Three times a day (TID) | ORAL | 0 refills | Status: DC | PRN

## 2017-07-18 MED ORDER — DOXYLAMINE SUCCINATE (SLEEP) 25 MG PO TABS: 25.0000 mg | ORAL_TABLET | Freq: Every evening | ORAL | 0 refills | Status: DC | PRN

## 2017-07-18 MED ORDER — SODIUM CHLORIDE 0.9 % IV BOLUS: 500.0000 mL | Freq: Once | INTRAVENOUS | Status: DC

## 2017-07-18 MED ORDER — SODIUM CHLORIDE 0.9 % IV BOLUS
1000.0000 mL | Freq: Once | INTRAVENOUS | Status: AC
Start: 2017-07-18 — End: 2017-07-18
  Administered 2017-07-18: 1000 mL via INTRAVENOUS

## 2017-07-18 NOTE — ED Provider Notes (Signed)
[facility] Provider Note   CSN: [id number] Arrival date & time: 07/18/17  1018     History   Chief Complaint Chief Complaint  Patient presents with  . Emesis During Pregnancy    HPI Krystal [name] is a 27 y.o. female.  The history is provided by the patient and medical records. No language interpreter was used.   Krystal [name] is a 27 y.o. female G1P0 who presents to the emergency department for persistent nausea and vomiting.  Patient states that her last menstrual period was April 10.  She took a home pregnancy test on May 14.  She then saw her primary doctor who performed a pregnancy test in the office which was positive.  She has not seen her OB/GYN yet, but does have an appointment coming up next week.  She reports having multiple episodes of emesis daily being unable to keep any fluids down for the last 2 to 3 days.  Decreased appetite noted as well.  She denies any abdominal pain, vaginal discharge, vaginal bleeding, back pain or dysuria.  No fever or chills.  She called her OB/GYN today to inform them of her persistent nausea vomiting.  They recommended that she come to the emergency department for evaluation and likely fluids for rehydration.  Past Medical History:  Diagnosis Date  . Anxiety   . Asthma   . Hypertension   . IC (interstitial cystitis)   . Kidney stones   . Migraines   . UTI (urinary tract infection)     There are no active problems to display for this patient.   Past Surgical History:  Procedure Laterality Date  . APPENDECTOMY    . 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 HAND SURGERY     right  . NASAL SEPTUM SURGERY       OB History    Gravida  1   Para      Term      Preterm      AB      Living        SAB      TAB      Ectopic      Multiple      Live Births               Home Medications    Prior to Admission medications   Medication Sig Start  Date End Date Taking? Authorizing Provider  albuterol (PROVENTIL HFA;VENTOLIN HFA) 108 (90 Base) MCG/ACT inhaler Inhale 1 puff into the lungs every 6 (six) hours as needed for wheezing or shortness of breath.    [provider]  ALPRAZolam Duanne Moron) 1 MG tablet Take 1 mg by mouth 2 (two) times daily as needed for anxiety.     [provider]  cyclobenzaprine (FLEXERIL) 10 MG tablet Take 1 tablet (10 mg total) by mouth 2 (two) times daily as needed for muscle spasms. Patient not taking: Reported on 01/16/2017 12/19/15   Horton, Barbette Hair, MD  doxylamine, Sleep, (UNISOM) 25 MG tablet Take 1 tablet (25 mg total) by mouth at bedtime as needed (Nausea). 07/18/17   Cali Hope, Ozella Almond, PA-C  levonorgestrel-ethinyl estradiol (AVIANE,ALESSE,LESSINA) 0.1-20 MG-MCG tablet Take 1 tablet by mouth daily.    [provider]  metroNIDAZOLE (FLAGYL) 500 MG tablet Take 1 tablet (500 mg total) by mouth 2 (two) times daily. 01/16/17   Waynetta Pean, PA-C  naproxen (NAPROSYN) 500 MG tablet  Take 1 tablet (500 mg total) by mouth 2 (two) times daily. Patient not taking: Reported on 01/16/2017 12/19/15   Horton, Barbette Hair, MD  ondansetron (ZOFRAN ODT) 4 MG disintegrating tablet Take 1 tablet (4 mg total) by mouth every 8 (eight) hours as needed for nausea or vomiting. 07/18/17   Annayah Worthley, Ozella Almond, PA-C  oxyCODONE-acetaminophen (PERCOCET/ROXICET) 5-325 MG tablet Take 1 tablet by mouth every 6 (six) hours as needed for severe pain. Patient not taking: Reported on 01/16/2017 11/06/15   Ashley Murrain, NP  Prenatal Vit-Fe Fumarate-FA (PRENATAL PO) Take 1 tablet by mouth daily.    [provider]  sertraline (ZOLOFT) 100 MG tablet Take 200 mg by mouth at bedtime.    [provider]  traMADol (ULTRAM) 50 MG tablet Take 50-100 mg by mouth every 6 (six) hours as needed for moderate pain.    [provider]  vitamin B-6 (VITAMIN B-6) 50 MG tablet Take 1 tablet (50 mg total) by  mouth 3 (three) times daily as needed. 07/18/17   Ariadna Setter, Ozella Almond, PA-C    Family History Family History  Problem Relation Age of Onset  . Hypertension Mother   . Hypertension Father     Social History Social History   Tobacco Use  . Smoking status: Never Smoker  . Smokeless tobacco: Never Used  Substance Use Topics  . Alcohol use: Yes    Comment: bottle of wine a week  . Drug use: No     Allergies   Red dye; Compazine [prochlorperazine maleate]; Other; Vicodin [hydrocodone-acetaminophen]; and Wheat bran   Review of Systems Review of Systems  Gastrointestinal: Positive for nausea and vomiting. Negative for abdominal pain.  Genitourinary: Negative for dysuria, pelvic pain, vaginal bleeding and vaginal discharge.  All other systems reviewed and are negative.    Physical Exam Updated Vital Signs BP (!) 169/94   Pulse 70   Temp 97.7 F (36.5 C) (Oral)   Resp 16   LMP 06/03/2017 (Exact Date)   SpO2 99%   Physical Exam  Constitutional: She is oriented to person, place, and time. She appears well-developed and well-nourished. No distress.  HENT:  Head: Normocephalic and atraumatic.  Cardiovascular: Normal rate, regular rhythm and normal heart sounds.  No murmur heard. Pulmonary/Chest: Effort normal and breath sounds normal. No respiratory distress.  Abdominal: Soft. Bowel sounds are normal. She exhibits no distension.  No abdominal tenderness.  Musculoskeletal: She exhibits no edema.  Neurological: She is alert and oriented to person, place, and time.  Skin: Skin is warm and dry.  Nursing note and vitals reviewed.    ED Treatments / Results  Labs (all labs ordered are listed, but only abnormal results are displayed) Labs Reviewed  CBC WITH DIFFERENTIAL/PLATELET - Abnormal; Notable for the following components:      Result Value   WBC 11.1 (*)    Hemoglobin 15.6 (*)    Neutro Abs 9.1 (*)    All other components within normal limits  COMPREHENSIVE  METABOLIC PANEL - Abnormal; Notable for the following components:   Glucose, Bld 114 (*)    All other components within normal limits  HCG, QUANTITATIVE, PREGNANCY - Abnormal; Notable for the following components:   hCG, Beta Chain, Quant, S 35,845 (*)    All other components within normal limits  URINALYSIS, ROUTINE W REFLEX MICROSCOPIC - Abnormal; Notable for the following components:   APPearance HAZY (*)    Ketones, ur 80 (*)    Protein, ur 100 (*)  Leukocytes, UA SMALL (*)    Bacteria, UA RARE (*)    All other components within normal limits  URINE CULTURE    EKG None  Radiology US Ob Comp < 14 Wks  Result Date: 07/18/2017 CLINICAL DATA:  Nausea for 3 weeks. Patient is 6 weeks and 3 days pregnant based on her last menstrual period. Beta HCG level is 35,845. EXAM: OBSTETRIC <14 WK [place] AND TRANSVAGINAL OB US TECHNIQUE: Both transabdominal and transvaginal ultrasound examinations were performed for complete evaluation of the gestation as well as the maternal uterus, adnexal regions, and pelvic cul-de-sac. Transvaginal technique was performed to assess early pregnancy. COMPARISON:  None. FINDINGS: Intrauterine gestational sac: Single Yolk sac:  Visualized. Embryo:  Visualized. Cardiac Activity: Visualized. Heart Rate: 144 bpm CRL:  4.4 mm   6 w   1 d                  [place] EDC: 03/12/2018 Subchorionic hemorrhage: Small subchronic hemorrhage measuring 8 x 3 x 7 mm. Maternal uterus/adnexae: No uterine masses. Cervix is closed. Right ovarian corpus luteum measuring 19 mm. Ovaries and adnexa otherwise unremarkable. No abnormal pelvic free fluid. IMPRESSION: 1. Single live anterior pregnancy with a measured gestational age of [redacted] weeks and 1 day, consistent with the expected gestational age based on the last menstrual period. 2. Small subchronic hemorrhage. No other pregnancy complication. Ovaries and adnexa are unremarkable. Electronically Signed   By: Lajean Manes M.D.   On: 07/18/2017 14:25   US  Ob Transvaginal  Result Date: 07/18/2017 CLINICAL DATA:  Nausea for 3 weeks. Patient is 6 weeks and 3 days pregnant based on her last menstrual period. Beta HCG level is 35,845. EXAM: OBSTETRIC <14 WK [place] AND TRANSVAGINAL OB US TECHNIQUE: Both transabdominal and transvaginal ultrasound examinations were performed for complete evaluation of the gestation as well as the maternal uterus, adnexal regions, and pelvic cul-de-sac. Transvaginal technique was performed to assess early pregnancy. COMPARISON:  None. FINDINGS: Intrauterine gestational sac: Single Yolk sac:  Visualized. Embryo:  Visualized. Cardiac Activity: Visualized. Heart Rate: 144 bpm CRL:  4.4 mm   6 w   1 d                  [place] EDC: 03/12/2018 Subchorionic hemorrhage: Small subchronic hemorrhage measuring 8 x 3 x 7 mm. Maternal uterus/adnexae: No uterine masses. Cervix is closed. Right ovarian corpus luteum measuring 19 mm. Ovaries and adnexa otherwise unremarkable. No abnormal pelvic free fluid. IMPRESSION: 1. Single live anterior pregnancy with a measured gestational age of [redacted] weeks and 1 day, consistent with the expected gestational age based on the last menstrual period. 2. Small subchronic hemorrhage. No other pregnancy complication. Ovaries and adnexa are unremarkable. Electronically Signed   By: Lajean Manes M.D.   On: 07/18/2017 14:25    Procedures Procedures (including critical care time)  Medications Ordered in ED Medications  ondansetron (ZOFRAN) injection 4 mg (4 mg Intravenous Given 07/18/17 1131)  sodium chloride 0.9 % bolus 1,000 mL (1,000 mLs Intravenous New Bag/Given 07/18/17 1131)  vitamin B-6 (pyridOXINE) tablet 25 mg (25 mg Oral Given 07/18/17 1227)  ondansetron (ZOFRAN) injection 4 mg (4 mg Intravenous Given 07/18/17 1451)     Initial Impression / Assessment and Plan / ED Course  I have reviewed the triage vital signs and the nursing notes.  Pertinent labs & imaging results that were available during my care of the  patient were reviewed by me and considered in my medical decision  making (see chart for details).    Keondria Siever Seger is a 27 y.o. female who presents to ED for persistent n/v over the last several days with + pregnancy test at home and PCP. Has not seen OBGYN yet. Has not had U/S yet. No abdominal pain, urinary symptoms, vaginal bleeding or vaginal discharge. No abdominal tenderness on exam. Labs reviewed and reassuring. UA with evidence of dehydration. Does have small leuks and 6-10 white cells, however does have 11-20 squamous as well. Not having any urinary symptoms - [place] [address]. Ultrasound obtained showing single live IUP of 6 weeks, 1 days.  IV fluids provided in ED. Patient feels improved after anti-pyretics. She is tolerating PO. Has OBGYN follow up next week. Reasons to return to ER discussed and all questions answered.   Patient seen by and discussed with Dr. Darl Householder who agrees with treatment plan.    Final Clinical Impressions(s) / ED Diagnoses   Final diagnoses:  Nausea and vomiting during pregnancy    ED Discharge Orders        Ordered    vitamin B-6 (VITAMIN B-6) 50 MG tablet  3 times daily PRN     07/18/17 1458    ondansetron (ZOFRAN ODT) 4 MG disintegrating tablet  Every 8 hours PRN     07/18/17 1458    doxylamine, Sleep, (UNISOM) 25 MG tablet  At bedtime PRN     07/18/17 1458       Chrisandra Wiemers, Ozella Almond, PA-C 07/18/17 1504    Drenda Freeze, MD 07/18/17 [phone]

## 2017-07-18 NOTE — ED Notes (Signed)
Pt stable, ambulatory, states understanding of discharge instructions 

## 2017-07-18 NOTE — ED Notes (Signed)
Pure wick in place 

## 2017-07-18 NOTE — Discharge Instructions (Signed)
It was my pleasure taking care of you today!   Please your OB/GYN when their office opens next week to arrange follow-up appointment.  B6 up to 3 times daily as needed for nausea.  You can also take this with Unisom (doxylamine) before bedtime for nausea.  Zofran as needed if the above medications are not helping. Ginger also can help settle the stomach. Don't let your stomach get empty! Nausea tends to get worse on an empty stomach. If your are unable to eat solid foods, try fluids / smoothies / etc. Fluids are often easier to tolerate when nausea occurs.   Colace at bedtime as needed for constipation. Increasing hydration will help with this as well.   Return to ER for new or worsening symptoms, any additional concerns.

## 2017-07-18 NOTE — ED Triage Notes (Signed)
Pt presents for multiple episodes of vomiting. Pt reports she is [redacted] weeks pregnant. No vaginal bleeding. Pt reports dizziness as well. States called OBGYN and they told her to go to the hospital.

## 2017-07-18 NOTE — ED Notes (Signed)
Medication requested.

## 2017-07-18 NOTE — ED Notes (Signed)
Patient transported to Ultrasound 

## 2017-07-19 LAB — URINE CULTURE

## 2017-07-24 ENCOUNTER — Encounter (HOSPITAL_COMMUNITY): Payer: Self-pay | Admitting: Anesthesiology

## 2017-07-24 ENCOUNTER — Encounter (HOSPITAL_COMMUNITY): Payer: Self-pay | Admitting: *Deleted

## 2017-07-24 ENCOUNTER — Inpatient Hospital Stay (HOSPITAL_COMMUNITY)
Admission: AD | Admit: 2017-07-24 | Discharge: 2017-07-24 | Disposition: A | Discharge status: Home / Self Care | Payer: Self-pay | Source: Ambulatory Visit | Attending: Obstetrics and Gynecology | Admitting: Obstetrics and Gynecology

## 2017-07-24 ENCOUNTER — Other Ambulatory Visit: Payer: Self-pay

## 2017-07-24 DIAGNOSIS — Z885 Allergy status to narcotic agent status: Secondary | ICD-10-CM | POA: Insufficient documentation

## 2017-07-24 DIAGNOSIS — Z3A01 Less than 8 weeks gestation of pregnancy: Secondary | ICD-10-CM | POA: Insufficient documentation

## 2017-07-24 DIAGNOSIS — O99341 Other mental disorders complicating pregnancy, first trimester: Secondary | ICD-10-CM | POA: Insufficient documentation

## 2017-07-24 DIAGNOSIS — F419 Anxiety disorder, unspecified: Secondary | ICD-10-CM | POA: Insufficient documentation

## 2017-07-24 DIAGNOSIS — O10919 Unspecified pre-existing hypertension complicating pregnancy, unspecified trimester: Secondary | ICD-10-CM

## 2017-07-24 DIAGNOSIS — Z87442 Personal history of urinary calculi: Secondary | ICD-10-CM | POA: Insufficient documentation

## 2017-07-24 DIAGNOSIS — O219 Vomiting of pregnancy, unspecified: Secondary | ICD-10-CM | POA: Insufficient documentation

## 2017-07-24 DIAGNOSIS — J45909 Unspecified asthma, uncomplicated: Secondary | ICD-10-CM | POA: Insufficient documentation

## 2017-07-24 DIAGNOSIS — Z79899 Other long term (current) drug therapy: Secondary | ICD-10-CM | POA: Insufficient documentation

## 2017-07-24 DIAGNOSIS — Z888 Allergy status to other drugs, medicaments and biological substances status: Secondary | ICD-10-CM | POA: Insufficient documentation

## 2017-07-24 DIAGNOSIS — Z9889 Other specified postprocedural states: Secondary | ICD-10-CM | POA: Insufficient documentation

## 2017-07-24 DIAGNOSIS — Z91018 Allergy to other foods: Secondary | ICD-10-CM | POA: Insufficient documentation

## 2017-07-24 DIAGNOSIS — Z8249 Family history of ischemic heart disease and other diseases of the circulatory system: Secondary | ICD-10-CM | POA: Insufficient documentation

## 2017-07-24 DIAGNOSIS — O9952 Diseases of the respiratory system complicating childbirth: Secondary | ICD-10-CM | POA: Insufficient documentation

## 2017-07-24 DIAGNOSIS — Z87440 Personal history of urinary (tract) infections: Secondary | ICD-10-CM | POA: Insufficient documentation

## 2017-07-24 DIAGNOSIS — O10911 Unspecified pre-existing hypertension complicating pregnancy, first trimester: Secondary | ICD-10-CM | POA: Insufficient documentation

## 2017-07-24 LAB — URINALYSIS, ROUTINE W REFLEX MICROSCOPIC
Bacteria, UA: NONE SEEN
Bilirubin Urine: NEGATIVE
Glucose, UA: NEGATIVE mg/dL
Hgb urine dipstick: NEGATIVE
Ketones, ur: 5 mg/dL — AB
Nitrite: NEGATIVE
Protein, ur: 100 mg/dL — AB
Specific Gravity, Urine: 1.027 (ref 1.005–1.030)
Squamous Epithelial / LPF: >50 — ABNORMAL HIGH (ref 0–5)
WBC, UA: >50 WBC/hpf — ABNORMAL HIGH (ref 0–5)
pH: 5 (ref 5.0–8.0)

## 2017-07-24 LAB — COMPREHENSIVE METABOLIC PANEL
ALT: 20 U/L (ref 14–54)
AST: 24 U/L (ref 15–41)
Albumin: 4.6 g/dL (ref 3.5–5.0)
Alkaline Phosphatase: 50 U/L (ref 38–126)
Anion gap: 13 (ref 5–15)
BUN: 10 mg/dL (ref 6–20)
CO2: 19 mmol/L — ABNORMAL LOW (ref 22–32)
Calcium: 9.5 mg/dL (ref 8.9–10.3)
Chloride: 104 mmol/L (ref 101–111)
Creatinine, Ser: 0.7 mg/dL (ref 0.44–1.00)
GFR calc Af Amer: >60 mL/min (ref >60)
GFR calc non Af Amer: >60 mL/min (ref >60)
Glucose, Bld: 89 mg/dL (ref 65–99)
Potassium: 4.1 mmol/L (ref 3.5–5.1)
Sodium: 136 mmol/L (ref 135–145)
Total Bilirubin: 1.1 mg/dL (ref 0.3–1.2)
Total Protein: 7.4 g/dL (ref 6.5–8.1)

## 2017-07-24 LAB — CBC
HCT: 42.2 % (ref 36.0–46.0)
Hemoglobin: 15 g/dL (ref 12.0–15.0)
MCH: 33.1 pg (ref 26.0–34.0)
MCHC: 35.5 g/dL (ref 30.0–36.0)
MCV: 93.2 fL (ref 78.0–100.0)
Platelets: 268 10*3/uL (ref 150–400)
RBC: 4.53 MIL/uL (ref 3.87–5.11)
RDW: 12.2 % (ref 11.5–15.5)
WBC: 12.6 10*3/uL — ABNORMAL HIGH (ref 4.0–10.5)

## 2017-07-24 MED ORDER — LACTATED RINGERS IV BOLUS: 1000.0000 mL | Freq: Once | INTRAVENOUS | Status: DC

## 2017-07-24 MED ORDER — LACTATED RINGERS IV BOLUS
1000.0000 mL | Freq: Once | INTRAVENOUS | Status: AC
  Administered 2017-07-24: 1000 mL via INTRAVENOUS

## 2017-07-24 MED ORDER — PROMETHAZINE HCL 25 MG/ML IJ SOLN: 25.0000 mg | Freq: Once | INTRAMUSCULAR | Status: DC

## 2017-07-24 MED ORDER — FAMOTIDINE IN NACL 20-0.9 MG/50ML-% IV SOLN
20.0000 mg | Freq: Once | INTRAVENOUS | Status: AC
  Administered 2017-07-24: 20 mg via INTRAVENOUS
  Filled 2017-07-24: qty 50

## 2017-07-24 MED ORDER — NIFEDIPINE ER OSMOTIC RELEASE 30 MG PO TB24
60.0000 mg | ORAL_TABLET | Freq: Once | ORAL | Status: AC
  Administered 2017-07-24: 60 mg via ORAL
  Filled 2017-07-24: qty 2

## 2017-07-24 MED ORDER — SCOPOLAMINE 1 MG/3DAYS TD PT72: 1.0000 | MEDICATED_PATCH | TRANSDERMAL | 12 refills | Status: DC

## 2017-07-24 MED ORDER — PROMETHAZINE HCL 25 MG PO TABS: 25.0000 mg | ORAL_TABLET | Freq: Four times a day (QID) | ORAL | 0 refills | Status: DC | PRN

## 2017-07-24 MED ORDER — M.V.I. ADULT IV INJ
Freq: Once | INTRAVENOUS | Status: AC
  Administered 2017-07-24: 17:00:00 via INTRAVENOUS
  Filled 2017-07-24: qty 10

## 2017-07-24 MED ORDER — FAMOTIDINE 20 MG PO TABS: 20.0000 mg | ORAL_TABLET | Freq: Two times a day (BID) | ORAL | 10 refills | Status: DC

## 2017-07-24 MED ORDER — PROMETHAZINE HCL 25 MG/ML IJ SOLN
25.0000 mg | Freq: Once | INTRAMUSCULAR | Status: AC
  Administered 2017-07-24: 25 mg via INTRAVENOUS
  Filled 2017-07-24: qty 1

## 2017-07-24 MED ORDER — LABETALOL HCL 5 MG/ML IV SOLN
20.0000 mg | Freq: Once | INTRAVENOUS | Status: AC
  Administered 2017-07-24: 20 mg via INTRAVENOUS
  Filled 2017-07-24: qty 4

## 2017-07-24 MED ORDER — DIPHENHYDRAMINE HCL 50 MG/ML IJ SOLN
25.0000 mg | Freq: Once | INTRAMUSCULAR | Status: AC
  Administered 2017-07-24: 25 mg via INTRAVENOUS
  Filled 2017-07-24: qty 1

## 2017-07-24 MED ORDER — LABETALOL HCL 100 MG PO TABS
200.0000 mg | ORAL_TABLET | Freq: Once | ORAL | Status: AC
Start: 2017-07-24 — End: 2017-07-24
  Administered 2017-07-24: 200 mg via ORAL
  Filled 2017-07-24: qty 2

## 2017-07-24 MED ORDER — NIFEDIPINE ER OSMOTIC RELEASE 30 MG PO TB24: 60.0000 mg | ORAL_TABLET | Freq: Every day | ORAL | 1 refills | Status: DC

## 2017-07-24 MED ORDER — SCOPOLAMINE 1 MG/3DAYS TD PT72
1.0000 | MEDICATED_PATCH | TRANSDERMAL | Status: DC
  Administered 2017-07-24: 1.5 mg via TRANSDERMAL
  Filled 2017-07-24: qty 1

## 2017-07-24 MED ORDER — SODIUM CHLORIDE 0.9 % IV SOLN
8.0000 mg | Freq: Once | INTRAVENOUS | Status: AC
  Administered 2017-07-24: 8 mg via INTRAVENOUS
  Filled 2017-07-24: qty 4

## 2017-07-24 MED ORDER — FAMOTIDINE IN NACL 20-0.9 MG/50ML-% IV SOLN: 20.0000 mg | Freq: Once | INTRAVENOUS | Status: DC

## 2017-07-24 NOTE — MAU Note (Signed)
Pt presents with c/o N &V, constipation and dizziness.  Pt reports unable to keep anything down including Zofran.  Reports constipation x4 days.  Has taken prune juice and prunes, has passed gas, no BM.

## 2017-07-24 NOTE — Discharge Instructions (Signed)
Hypertension During Pregnancy °Hypertension, commonly called high blood pressure, is when the force of blood pumping through your arteries is too strong. Arteries are blood vessels that carry blood from the heart throughout the body. Hypertension during pregnancy can cause problems for you and your baby. Your baby may be born early (prematurely) or may not weigh as much as he or she should at birth. Very bad cases of hypertension during pregnancy can be life-threatening. °Different types of hypertension can occur during pregnancy. These include: °· Chronic hypertension. This happens when: °? You have hypertension before pregnancy and it continues during pregnancy. °? You develop hypertension before you are [redacted] weeks pregnant, and it continues during pregnancy. °· Gestational hypertension. This is hypertension that develops after the 20th week of pregnancy. °· Preeclampsia, also called toxemia of pregnancy. This is a very serious type of hypertension that develops only during pregnancy. It affects the whole body, and it can be very dangerous for you and your baby. ° °Gestational hypertension and preeclampsia usually go away within 6 weeks after your baby is born. Women who have hypertension during pregnancy have a greater chance of developing hypertension later in life or during future pregnancies. °What are the causes? °The exact cause of hypertension is not known. °What increases the risk? °There are certain factors that make it more likely for you to develop hypertension during pregnancy. These include: °· Having hypertension during a previous pregnancy or prior to pregnancy. °· Being overweight. °· Being older than age 40. °· Being pregnant for the first time or being pregnant with more than one baby. °· Becoming pregnant using fertilization methods such as IVF (in vitro fertilization). °· Having diabetes, kidney problems, or systemic lupus erythematosus. °· Having a family history of hypertension. ° °What are the  signs or symptoms? °Chronic hypertension and gestational hypertension rarely cause symptoms. Preeclampsia causes symptoms, which may include: °· Increased protein in your urine. Your health care provider will check for this at every visit before you give birth (prenatal visit). °· Severe headaches. °· Sudden weight gain. °· Swelling of the hands, face, legs, and feet. °· Nausea and vomiting. °· Vision problems, such as blurred or double vision. °· Numbness in the face, arms, legs, and feet. °· Dizziness. °· Slurred speech. °· Sensitivity to bright lights. °· Abdominal pain. °· Convulsions. ° °How is this diagnosed? °You may be diagnosed with hypertension during a routine prenatal exam. At each prenatal visit, you may: °· Have a urine test to check for high amounts of protein in your urine. °· Have your blood pressure checked. A blood pressure reading is recorded as two numbers, such as "120 over 80" (or 120/80). The first ("top") number is called the systolic pressure. It is a measure of the pressure in your arteries when your heart beats. The second ("bottom") number is called the diastolic pressure. It is a measure of the pressure in your arteries as your heart relaxes between beats. Blood pressure is measured in a unit called mm Hg. A normal blood pressure reading is: °? Systolic: below 120. °? Diastolic: below 80. ° °The type of hypertension that you are diagnosed with depends on your test results and when your symptoms developed. °· Chronic hypertension is usually diagnosed before 20 weeks of pregnancy. °· Gestational hypertension is usually diagnosed after 20 weeks of pregnancy. °· Hypertension with high amounts of protein in the urine is diagnosed as preeclampsia. °· Blood pressure measurements that stay above 160 systolic, or above 110 diastolic, are   signs of severe preeclampsia.  How is this treated? Treatment for hypertension during pregnancy varies depending on the type of hypertension you have and how  serious it is.  If you take medicines called ACE inhibitors to treat chronic hypertension, you may need to switch medicines. ACE inhibitors should not be taken during pregnancy.  If you have gestational hypertension, you may need to take blood pressure medicine.  If you are at risk for preeclampsia, your health care provider may recommend that you take a low-dose aspirin every day to prevent high blood pressure during your pregnancy.  If you have severe preeclampsia, you may need to be hospitalized so you and your baby can be monitored closely. You may also need to take medicine (magnesium sulfate) to prevent seizures and to lower blood pressure. This medicine may be given as an injection or through an IV tube.  In some cases, if your condition gets worse, you may need to deliver your baby early.  Follow these instructions at home: Eating and drinking  Drink enough fluid to keep your urine clear or pale yellow.  Eat a healthy diet that is low in salt (sodium). Do not add salt to your food. Check food labels to see how much sodium a food or beverage contains. Lifestyle  Do not use any products that contain nicotine or tobacco, such as cigarettes and e-cigarettes. If you need help quitting, ask your health care provider.  Do not use alcohol.  Avoid caffeine.  Avoid stress as much as possible. Rest and get plenty of sleep. General instructions  Take over-the-counter and prescription medicines only as told by your health care provider.  While lying down, lie on your left side. This keeps pressure off your baby.  While sitting or lying down, raise (elevate) your feet. Try putting some pillows under your lower legs.  Exercise regularly. Ask your health care provider what kinds of exercise are best for you.  Keep all prenatal and follow-up visits as told by your health care provider. This is important. Contact a health care provider if:  You have symptoms that your health care  provider told you may require more treatment or monitoring, such as: ? Fever. ? Vomiting. ? Headache. Get help right away if:  You have severe abdominal pain or vomiting that does not get better with treatment.  You suddenly develop swelling in your hands, ankles, or face.  You gain 4 lbs (1.8 kg) or more in 1 week.  You develop vaginal bleeding, or you have blood in your urine.  You do not feel your baby moving as much as usual.  You have blurred or double vision.  You have muscle twitching or sudden tightening (spasms).  You have shortness of breath.  Your lips or fingernails turn blue. This information is not intended to replace advice given to you by your health care provider. Make sure you discuss any questions you have with your health care provider. Document Released: 10/29/2010 Document Revised: 08/31/2015 Document Reviewed: 07/27/2015 Elsevier Interactive Patient Education  2018 [employer].   Morning Sickness Morning sickness is when you feel sick to your stomach (nauseous) during pregnancy. You may feel sick to your stomach and throw up (vomit). You may feel sick in the morning, but you can feel this way any time of day. Some women feel very sick to their stomach and cannot stop throwing up (hyperemesis gravidarum). Follow these instructions at home:  Only take medicines as told by your doctor.  Take multivitamins  as told by your doctor. Taking multivitamins before getting pregnant can stop or lessen the harshness of morning sickness.  Eat dry toast or unsalted crackers before getting out of bed.  Eat 5 to 6 small meals a day.  Eat dry and bland foods like rice and baked potatoes.  Do not drink liquids with meals. Drink between meals.  Do not eat greasy, fatty, or spicy foods.  Have someone cook for you if the smell of food causes you to feel sick or throw up.  If you feel sick to your stomach after taking prenatal vitamins, take them at night or with a  snack.  Eat protein when you need a snack (nuts, yogurt, cheese).  Eat unsweetened gelatins for dessert.  Wear a bracelet used for sea sickness (acupressure wristband).  Go to a doctor that puts thin needles into certain body points (acupuncture) to improve how you feel.  Do not smoke.  Use a humidifier to keep the air in your house free of odors.  Get lots of fresh air. Contact a doctor if:  You need medicine to feel better.  You feel dizzy or lightheaded.  You are losing weight. Get help right away if:  You feel very sick to your stomach and cannot stop throwing up.  You pass out (faint). This information is not intended to replace advice given to you by your health care provider. Make sure you discuss any questions you have with your health care provider. Document Released: 03/20/2004 Document Revised: 07/19/2015 Document Reviewed: 07/28/2012 Elsevier Interactive Patient Education  2017 [employer].

## 2017-07-24 NOTE — Progress Notes (Signed)
IV attempts by Margarita Sermons, CRNA

## 2017-07-24 NOTE — MAU Note (Signed)
Urine in lab 

## 2017-07-24 NOTE — MAU Provider Note (Addendum)
History     CSN: [id number]  Arrival date and time: 07/24/17 1240   First Provider Initiated Contact with Patient 07/24/17 1327      Chief Complaint  Patient presents with  . Constipation  . Nausea  . Emesis   HPI Krystal [name] is a 27 y.o. G1P0 at [redacted]w[redacted]d who presents with n/v and constipation. Was seen in ED the other day for same symptoms and was prescribed zofran. States she has vomited over 20 times today and hasn't been able to keep down the zofran. Endorses heartburn; previously taking zantac and tums for but hasn't been able to keep that down recently.  Last BM was 3 days ago. States she normally goes twice per day. She feels like she needs to have a BM but unable to go. Has been passing gas. Reports history of constipation throughout her life and is very comfortable treating her constipation but was unsure what she could take while she's pregnant.  Denies fever/chills, abdominal pain, or vaginal bleeding.  Plans on going to [facility]; upcoming appt in a few weeks but has never been seen there.   She has hx of chronic hypertension. Previously on procardia but was discontinued by her PCP when she found out she was pregnant. Denies headache, chest pain, or SOB.   OB History    Gravida  1   Para      Term      Preterm      AB      Living        SAB      TAB      Ectopic      Multiple      Live Births              Past Medical History:  Diagnosis Date  . Anxiety   . Asthma   . Hypertension   . IC (interstitial cystitis)   . Kidney stones   . Migraines   . UTI (urinary tract infection)     Past Surgical History:  Procedure Laterality Date  . APPENDECTOMY    . 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 HAND RECONSTRUCTION Right   . NASAL SEPTUM SURGERY      Family History  Problem Relation Age of Onset  . Hypertension Mother   . Hypertension Father     Social History   Tobacco Use  . Smoking status: Never  Smoker  . Smokeless tobacco: Never Used  Substance Use Topics  . Alcohol use: Yes    Comment: bottle of wine a week  . Drug use: No    Allergies:  Allergies  Allergen Reactions  . Red Dye Other (See Comments)    ALL CAUSE PROBLEMS #40 IS WORST-MIGRAINES   . Compazine [Prochlorperazine Maleate] Other (See Comments)    TWITCHING, CANT STAY STILL  . Other Other (See Comments)    FANSIDAR FOR MALARIA-ASTHMA  . Vicodin [Hydrocodone-Acetaminophen] Itching and Nausea And Vomiting  . Wheat Bran Other (See Comments)    GLUTEN SENSITIVE    Medications Prior to Admission  Medication Sig Dispense Refill Last Dose  . albuterol (PROVENTIL HFA;VENTOLIN HFA) 108 (90 Base) MCG/ACT inhaler Inhale 1 puff into the lungs every 6 (six) hours as needed for wheezing or shortness of breath.   Past Week at Unknown time  . ALPRAZolam (XANAX) 1 MG tablet Take 1 mg by mouth 2 (two) times daily as needed for anxiety.  01/16/2017 at Unknown time  . cyclobenzaprine (FLEXERIL) 10 MG tablet Take 1 tablet (10 mg total) by mouth 2 (two) times daily as needed for muscle spasms. (Patient not taking: Reported on 01/16/2017) 10 tablet 0 Completed Course at Unknown time  . doxylamine, Sleep, (UNISOM) 25 MG tablet Take 1 tablet (25 mg total) by mouth at bedtime as needed (Nausea). 30 tablet 0   . levonorgestrel-ethinyl estradiol (AVIANE,ALESSE,LESSINA) 0.1-20 MG-MCG tablet Take 1 tablet by mouth daily.   Not Taking at Unknown time  . metroNIDAZOLE (FLAGYL) 500 MG tablet Take 1 tablet (500 mg total) by mouth 2 (two) times daily. 14 tablet 0   . naproxen (NAPROSYN) 500 MG tablet Take 1 tablet (500 mg total) by mouth 2 (two) times daily. (Patient not taking: Reported on 01/16/2017) 30 tablet 0 Completed Course at Unknown time  . ondansetron (ZOFRAN ODT) 4 MG disintegrating tablet Take 1 tablet (4 mg total) by mouth every 8 (eight) hours as needed for nausea or vomiting. 12 tablet 0   . oxyCODONE-acetaminophen  (PERCOCET/ROXICET) 5-325 MG tablet Take 1 tablet by mouth every 6 (six) hours as needed for severe pain. (Patient not taking: Reported on 01/16/2017) 15 tablet 0 Completed Course at Unknown time  . Prenatal Vit-Fe Fumarate-FA (PRENATAL PO) Take 1 tablet by mouth daily.   01/16/2017 at Unknown time  . sertraline (ZOLOFT) 100 MG tablet Take 200 mg by mouth at bedtime.   Not Taking at Unknown time  . traMADol (ULTRAM) 50 MG tablet Take 50-100 mg by mouth every 6 (six) hours as needed for moderate pain.   01/16/2017 at Unknown time  . vitamin B-6 (VITAMIN B-6) 50 MG tablet Take 1 tablet (50 mg total) by mouth 3 (three) times daily as needed. 30 tablet 0     Review of Systems  Constitutional: Negative.   Respiratory: Negative.   Cardiovascular: Negative.   Gastrointestinal: Positive for constipation, nausea and vomiting. Negative for abdominal pain and diarrhea.  Genitourinary: Negative.   Neurological: Negative.    Physical Exam   Blood pressure (!) 183/99, pulse 88, temperature 98.6 F (37 C), temperature source Oral, resp. rate 20, height 5\' 11"  (1.803 m), weight 207 lb 12 oz (94.2 kg), last menstrual period 06/03/2017, SpO2 100 %.  Physical Exam  Nursing note and vitals reviewed. Constitutional: She is oriented to person, place, and time. She appears well-developed and well-nourished. No distress.  HENT:  Head: Normocephalic and atraumatic.  Eyes: Conjunctivae are normal. Right eye exhibits no discharge. Left eye exhibits no discharge. No scleral icterus.  Neck: Normal range of motion.  Cardiovascular: Normal rate, regular rhythm and normal heart sounds.  No murmur heard. Respiratory: Effort normal and breath sounds normal. No respiratory distress. She has no wheezes.  GI: Soft. Bowel sounds are normal. She exhibits no distension. There is no tenderness. There is no rebound and no guarding.  Neurological: She is alert and oriented to person, place, and time.  Skin: Skin is warm and  dry. She is not diaphoretic.  Psychiatric: She has a normal mood and affect. Her behavior is normal. Judgment and thought content normal.    MAU Course  Procedures Results for orders placed or performed during the hospital encounter of 07/24/17 (from the past 24 hour(s))  Urinalysis, Routine w reflex microscopic     Status: Abnormal   Collection Time: 07/24/17 12:45 PM  Result Value Ref Range   Color, Urine AMBER (A) YELLOW   APPearance CLOUDY (A) CLEAR   Specific Gravity,  Urine 1.027 1.005 - 1.030   pH 5.0 5.0 - 8.0   Glucose, UA NEGATIVE NEGATIVE mg/dL   Hgb urine dipstick NEGATIVE NEGATIVE   Bilirubin Urine NEGATIVE NEGATIVE   Ketones, ur 5 (A) NEGATIVE mg/dL   Protein, ur 100 (A) NEGATIVE mg/dL   Nitrite NEGATIVE NEGATIVE   Leukocytes, UA MODERATE (A) NEGATIVE   RBC / HPF 11-20 0 - 5 RBC/hpf   WBC, UA >50 (H) 0 - 5 WBC/hpf   Bacteria, UA NONE SEEN NONE SEEN   Squamous Epithelial / LPF >50 (H) 0 - 5   Mucus PRESENT   Comprehensive metabolic panel     Status: Abnormal   Collection Time: 07/24/17  2:30 PM  Result Value Ref Range   Sodium 136 135 - 145 mmol/L   Potassium 4.1 3.5 - 5.1 mmol/L   Chloride 104 101 - 111 mmol/L   CO2 19 (L) 22 - 32 mmol/L   Glucose, Bld 89 65 - 99 mg/dL   BUN 10 6 - 20 mg/dL   Creatinine, Ser 0.70 0.44 - 1.00 mg/dL   Calcium 9.5 8.9 - 10.3 mg/dL   Total Protein 7.4 6.5 - 8.1 g/dL   Albumin 4.6 3.5 - 5.0 g/dL   AST 24 15 - 41 U/L   ALT 20 14 - 54 U/L   Alkaline Phosphatase 50 38 - 126 U/L   Total Bilirubin 1.1 0.3 - 1.2 mg/dL   GFR calc non Af Amer >60 >60 mL/min   GFR calc Af Amer >60 >60 mL/min   Anion gap 13 5 - 15  CBC     Status: Abnormal   Collection Time: 07/24/17  3:46 PM  Result Value Ref Range   WBC 12.6 (H) 4.0 - 10.5 K/uL   RBC 4.53 3.87 - 5.11 MIL/uL   Hemoglobin 15.0 12.0 - 15.0 g/dL   HCT 42.2 36.0 - 46.0 %   MCV 93.2 78.0 - 100.0 fL   MCH 33.1 26.0 - 34.0 pg   MCHC 35.5 30.0 - 36.0 g/dL   RDW 12.2 11.5 - 15.5 %    Platelets 268 150 - 400 K/uL    MDM Elevated BP. Pt asymptomatic. Per review of records, BPs comparable to previous visits.  Discussed BP with Dr. Ilda Basset. Will restart patient's procardia at time of discharge today.   Pt actively vomiting. IV fluids and antiemetics ordered. 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 After several attempts, CRNA was able to start IV. Patient given LR bolus & MVI in D5LR.  Phenergan 25 mg & pepcid 20 mg given. Patient attempted to drink water and started vomiting again. Zofran 8 mg IV ordered.   In to reassess patient. She thinks her nausea is improving but she feels like her chest is tightening and she's about to have a panic attack. Does have hx of anxiety, panic attacks & PTSD. Normally takes xanax for symptoms prior to pregnancy. Was told by her PCP that she could take benadryl for symptoms. Offered benadryl or vistaril for symptoms -- pt prefers benadryl. Benadryl 25 mg IV & scop patch ordered. Will reassess vitals.   Care turned over to [facility] PA Jorje Guild, NP 07/24/2017 8:09 PM   Patient called out stating worsening chest pain. EKG attempted. Appears normal, but no read available. Discussed with Dr. Glo Herring. 20 mg IV Labetalol and 200 mg PO Labetalol for BP. Patient reports significant improvement in chest pain. Also states  significant improvement in nausea and able to tolerate PO fluids. Patient offered GI cocktail for epigastric discomfort associated with chest pain. Patient refused.   Assessment and Plan  A:  SIUP at [redacted]w[redacted]d CHTN Nausea and vomiting in pregnancy prior to 22 weeks   P:  Discharge home Rx for Phenergan, Scopalamin patches and Pepcid given  Rx for Procardia XL given to restart Warning signs for worsening condition discussed Patient advised to follow-up with [facility] within the next week to start prenatal care as planned  Patient may return to MAU as  needed or if her condition were to change or worsen  Danielle Rankin 07/24/2017 9:51 PM

## 2017-08-13 ENCOUNTER — Emergency Department (HOSPITAL_COMMUNITY)
Admission: EM | Admit: 2017-08-13 | Discharge: 2017-08-14 | Disposition: A | Discharge status: Home / Self Care | Payer: No Typology Code available for payment source | Attending: Emergency Medicine | Admitting: Emergency Medicine

## 2017-08-13 ENCOUNTER — Other Ambulatory Visit: Payer: Self-pay

## 2017-08-13 ENCOUNTER — Encounter (HOSPITAL_COMMUNITY): Payer: Self-pay

## 2017-08-13 ENCOUNTER — Emergency Department (HOSPITAL_COMMUNITY): Payer: No Typology Code available for payment source

## 2017-08-13 DIAGNOSIS — O99280 Endocrine, nutritional and metabolic diseases complicating pregnancy, unspecified trimester: Secondary | ICD-10-CM | POA: Diagnosis not present

## 2017-08-13 DIAGNOSIS — Y999 Unspecified external cause status: Secondary | ICD-10-CM | POA: Insufficient documentation

## 2017-08-13 DIAGNOSIS — O10919 Unspecified pre-existing hypertension complicating pregnancy, unspecified trimester: Secondary | ICD-10-CM | POA: Diagnosis not present

## 2017-08-13 DIAGNOSIS — S161XXA Strain of muscle, fascia and tendon at neck level, initial encounter: Secondary | ICD-10-CM | POA: Insufficient documentation

## 2017-08-13 DIAGNOSIS — R102 Pelvic and perineal pain: Secondary | ICD-10-CM | POA: Insufficient documentation

## 2017-08-13 DIAGNOSIS — O9989 Other specified diseases and conditions complicating pregnancy, childbirth and the puerperium: Secondary | ICD-10-CM | POA: Insufficient documentation

## 2017-08-13 DIAGNOSIS — E876 Hypokalemia: Secondary | ICD-10-CM

## 2017-08-13 DIAGNOSIS — O9952 Diseases of the respiratory system complicating childbirth: Secondary | ICD-10-CM | POA: Insufficient documentation

## 2017-08-13 DIAGNOSIS — Z3A00 Weeks of gestation of pregnancy not specified: Secondary | ICD-10-CM | POA: Diagnosis not present

## 2017-08-13 DIAGNOSIS — Y939 Activity, unspecified: Secondary | ICD-10-CM | POA: Diagnosis not present

## 2017-08-13 DIAGNOSIS — R109 Unspecified abdominal pain: Secondary | ICD-10-CM | POA: Diagnosis not present

## 2017-08-13 DIAGNOSIS — S39012A Strain of muscle, fascia and tendon of lower back, initial encounter: Secondary | ICD-10-CM | POA: Insufficient documentation

## 2017-08-13 DIAGNOSIS — O26899 Other specified pregnancy related conditions, unspecified trimester: Secondary | ICD-10-CM

## 2017-08-13 DIAGNOSIS — Y929 Unspecified place or not applicable: Secondary | ICD-10-CM | POA: Diagnosis not present

## 2017-08-13 LAB — CBC
HCT: 39.3 % (ref 36.0–46.0)
Hemoglobin: 14.1 g/dL (ref 12.0–15.0)
MCH: 32.8 pg (ref 26.0–34.0)
MCHC: 35.9 g/dL (ref 30.0–36.0)
MCV: 91.4 fL (ref 78.0–100.0)
Platelets: 298 10*3/uL (ref 150–400)
RBC: 4.3 MIL/uL (ref 3.87–5.11)
RDW: 11.5 % (ref 11.5–15.5)
WBC: 11.5 10*3/uL — ABNORMAL HIGH (ref 4.0–10.5)

## 2017-08-13 LAB — COMPREHENSIVE METABOLIC PANEL
ALT: 19 U/L (ref 14–54)
AST: 19 U/L (ref 15–41)
Albumin: 4 g/dL (ref 3.5–5.0)
Alkaline Phosphatase: 41 U/L (ref 38–126)
Anion gap: 10 (ref 5–15)
BUN: 5 mg/dL — ABNORMAL LOW (ref 6–20)
CO2: 22 mmol/L (ref 22–32)
Calcium: 9.3 mg/dL (ref 8.9–10.3)
Chloride: 105 mmol/L (ref 101–111)
Creatinine, Ser: 0.56 mg/dL (ref 0.44–1.00)
GFR calc Af Amer: >60 mL/min (ref >60)
GFR calc non Af Amer: >60 mL/min (ref >60)
Glucose, Bld: 92 mg/dL (ref 65–99)
Potassium: 3 mmol/L — ABNORMAL LOW (ref 3.5–5.1)
Sodium: 137 mmol/L (ref 135–145)
Total Bilirubin: 0.6 mg/dL (ref 0.3–1.2)
Total Protein: 6.6 g/dL (ref 6.5–8.1)

## 2017-08-13 LAB — URINALYSIS, ROUTINE W REFLEX MICROSCOPIC
Bilirubin Urine: NEGATIVE
Glucose, UA: NEGATIVE mg/dL
Hgb urine dipstick: NEGATIVE
Ketones, ur: 20 mg/dL — AB
Leukocytes, UA: NEGATIVE
Nitrite: NEGATIVE
Protein, ur: NEGATIVE mg/dL
Specific Gravity, Urine: 1.011 (ref 1.005–1.030)
pH: 8 (ref 5.0–8.0)

## 2017-08-13 LAB — HCG, QUANTITATIVE, PREGNANCY: hCG, Beta Chain, Quant, S: 81984 m[IU]/mL — ABNORMAL HIGH (ref <5)

## 2017-08-13 MED ORDER — ACETAMINOPHEN 500 MG PO TABS
1000.0000 mg | ORAL_TABLET | Freq: Once | ORAL | Status: AC
  Administered 2017-08-13: 1000 mg via ORAL
  Filled 2017-08-13: qty 2

## 2017-08-13 MED ORDER — POTASSIUM CHLORIDE CRYS ER 20 MEQ PO TBCR: 20.0000 meq | EXTENDED_RELEASE_TABLET | Freq: Two times a day (BID) | ORAL | 0 refills | Status: DC

## 2017-08-13 MED ORDER — ONDANSETRON HCL 4 MG/2ML IJ SOLN: 4.0000 mg | Freq: Once | INTRAMUSCULAR | Status: DC

## 2017-08-13 MED ORDER — ONDANSETRON 4 MG PO TBDP
4.0000 mg | ORAL_TABLET | Freq: Once | ORAL | Status: AC
  Administered 2017-08-13: 4 mg via ORAL
  Filled 2017-08-13: qty 1

## 2017-08-13 MED ORDER — POTASSIUM CHLORIDE CRYS ER 20 MEQ PO TBCR
40.0000 meq | EXTENDED_RELEASE_TABLET | Freq: Once | ORAL | Status: AC
  Administered 2017-08-13: 40 meq via ORAL
  Filled 2017-08-13: qty 2

## 2017-08-13 MED ORDER — SODIUM CHLORIDE 0.9 % IV BOLUS: 1000.0000 mL | Freq: Once | INTRAVENOUS | Status: DC

## 2017-08-13 NOTE — ED Notes (Signed)
IV Team bedside 
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
## 2017-08-13 NOTE — ED Notes (Signed)
Family at bedside. 
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
## 2017-08-13 NOTE — ED Notes (Signed)
EDP cleared pt to eat and drink
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
## 2017-08-13 NOTE — ED Notes (Signed)
Delay in lab draw,  Pt currently in ultrasound. 

## 2017-08-13 NOTE — ED Notes (Signed)
Patient transported to Ultrasound 

## 2017-08-13 NOTE — Progress Notes (Signed)
This nurse went with Laurice Record RN VAST to assess patient for vascular access. Upon speaking with the PA the VAST team was instructed not to place an access at this time, d/t patient plan for DC. Fran Lowes, RN VAST

## 2017-10-08 ENCOUNTER — Other Ambulatory Visit: Payer: Self-pay

## 2017-10-08 ENCOUNTER — Encounter (HOSPITAL_COMMUNITY): Payer: Self-pay | Admitting: Anesthesiology

## 2017-10-08 ENCOUNTER — Inpatient Hospital Stay (HOSPITAL_COMMUNITY): Payer: Medicaid Other

## 2017-10-08 ENCOUNTER — Inpatient Hospital Stay (HOSPITAL_COMMUNITY)
Admission: AD | Admit: 2017-10-08 | Discharge: 2017-10-08 | Disposition: A | Discharge status: Home / Self Care | Payer: Medicaid Other | Source: Ambulatory Visit | Attending: Obstetrics and Gynecology | Admitting: Obstetrics and Gynecology

## 2017-10-08 ENCOUNTER — Encounter (HOSPITAL_COMMUNITY): Payer: Self-pay | Admitting: *Deleted

## 2017-10-08 DIAGNOSIS — R109 Unspecified abdominal pain: Secondary | ICD-10-CM

## 2017-10-08 DIAGNOSIS — G44209 Tension-type headache, unspecified, not intractable: Secondary | ICD-10-CM

## 2017-10-08 DIAGNOSIS — Z3A18 18 weeks gestation of pregnancy: Secondary | ICD-10-CM | POA: Diagnosis not present

## 2017-10-08 DIAGNOSIS — G44201 Tension-type headache, unspecified, intractable: Secondary | ICD-10-CM | POA: Diagnosis not present

## 2017-10-08 DIAGNOSIS — Z87891 Personal history of nicotine dependence: Secondary | ICD-10-CM | POA: Insufficient documentation

## 2017-10-08 DIAGNOSIS — O10919 Unspecified pre-existing hypertension complicating pregnancy, unspecified trimester: Secondary | ICD-10-CM

## 2017-10-08 DIAGNOSIS — O26892 Other specified pregnancy related conditions, second trimester: Secondary | ICD-10-CM | POA: Diagnosis not present

## 2017-10-08 DIAGNOSIS — I10 Essential (primary) hypertension: Secondary | ICD-10-CM

## 2017-10-08 HISTORY — DX: Essential (primary) hypertension: I10

## 2017-10-08 LAB — COMPREHENSIVE METABOLIC PANEL
ALT: 16 U/L (ref 0–44)
AST: 20 U/L (ref 15–41)
Albumin: 4.1 g/dL (ref 3.5–5.0)
Alkaline Phosphatase: 47 U/L (ref 38–126)
Anion gap: 12 (ref 5–15)
BUN: 9 mg/dL (ref 6–20)
CO2: 19 mmol/L — ABNORMAL LOW (ref 22–32)
Calcium: 9.7 mg/dL (ref 8.9–10.3)
Chloride: 105 mmol/L (ref 98–111)
Creatinine, Ser: 0.44 mg/dL (ref 0.44–1.00)
GFR calc Af Amer: >60 mL/min (ref >60)
GFR calc non Af Amer: >60 mL/min (ref >60)
Glucose, Bld: 93 mg/dL (ref 70–99)
Potassium: 3.5 mmol/L (ref 3.5–5.1)
Sodium: 136 mmol/L (ref 135–145)
Total Bilirubin: 1 mg/dL (ref 0.3–1.2)
Total Protein: 6.9 g/dL (ref 6.5–8.1)

## 2017-10-08 LAB — URINALYSIS, ROUTINE W REFLEX MICROSCOPIC
Bilirubin Urine: NEGATIVE
Glucose, UA: NEGATIVE mg/dL
Hgb urine dipstick: NEGATIVE
Ketones, ur: NEGATIVE mg/dL
Nitrite: NEGATIVE
Protein, ur: NEGATIVE mg/dL
Specific Gravity, Urine: 1.005 (ref 1.005–1.030)
pH: 8 (ref 5.0–8.0)

## 2017-10-08 LAB — PROTEIN / CREATININE RATIO, URINE
Creatinine, Urine: 30 mg/dL
Total Protein, Urine: <6 mg/dL

## 2017-10-08 LAB — CBC
HCT: 43.3 % (ref 36.0–46.0)
Hemoglobin: 15.3 g/dL — ABNORMAL HIGH (ref 12.0–15.0)
MCH: 33.6 pg (ref 26.0–34.0)
MCHC: 35.3 g/dL (ref 30.0–36.0)
MCV: 95 fL (ref 78.0–100.0)
Platelets: 285 10*3/uL (ref 150–400)
RBC: 4.56 MIL/uL (ref 3.87–5.11)
RDW: 12.9 % (ref 11.5–15.5)
WBC: 12.2 10*3/uL — ABNORMAL HIGH (ref 4.0–10.5)

## 2017-10-08 MED ORDER — CYCLOBENZAPRINE HCL 10 MG PO TABS
10.0000 mg | ORAL_TABLET | Freq: Once | ORAL | Status: DC
  Filled 2017-10-08: qty 1

## 2017-10-08 MED ORDER — METOCLOPRAMIDE HCL 5 MG/ML IJ SOLN
10.0000 mg | Freq: Once | INTRAMUSCULAR | Status: AC
  Administered 2017-10-08: 10 mg via INTRAVENOUS
  Filled 2017-10-08: qty 2

## 2017-10-08 MED ORDER — NIFEDIPINE 10 MG PO CAPS: 20.0000 mg | ORAL_CAPSULE | ORAL | Status: DC | PRN

## 2017-10-08 MED ORDER — METOCLOPRAMIDE HCL 10 MG PO TABS: 10.0000 mg | ORAL_TABLET | Freq: Once | ORAL | Status: DC

## 2017-10-08 MED ORDER — LABETALOL HCL 5 MG/ML IV SOLN
20.0000 mg | Freq: Once | INTRAVENOUS | Status: AC
  Administered 2017-10-08: 20 mg via INTRAVENOUS
  Filled 2017-10-08: qty 4

## 2017-10-08 MED ORDER — DIPHENHYDRAMINE HCL 25 MG PO CAPS: 25.0000 mg | ORAL_CAPSULE | Freq: Four times a day (QID) | ORAL | 0 refills | Status: DC | PRN

## 2017-10-08 MED ORDER — DIPHENHYDRAMINE HCL 50 MG/ML IJ SOLN
50.0000 mg | Freq: Once | INTRAMUSCULAR | Status: AC
  Administered 2017-10-08: 50 mg via INTRAVENOUS
  Filled 2017-10-08: qty 1

## 2017-10-08 MED ORDER — DEXAMETHASONE SODIUM PHOSPHATE 10 MG/ML IJ SOLN
10.0000 mg | Freq: Once | INTRAMUSCULAR | Status: AC
  Administered 2017-10-08: 10 mg via INTRAVENOUS
  Filled 2017-10-08: qty 1

## 2017-10-08 MED ORDER — DIPHENHYDRAMINE HCL 50 MG/ML IJ SOLN: 25.0000 mg | Freq: Once | INTRAMUSCULAR | Status: DC

## 2017-10-08 MED ORDER — NIFEDIPINE 10 MG PO CAPS
10.0000 mg | ORAL_CAPSULE | ORAL | Status: DC | PRN
  Filled 2017-10-08: qty 1

## 2017-10-08 MED ORDER — SODIUM CHLORIDE 0.9 % IV SOLN
8.0000 mg | Freq: Once | INTRAVENOUS | Status: AC
  Administered 2017-10-08: 8 mg via INTRAVENOUS
  Filled 2017-10-08: qty 4

## 2017-10-08 MED ORDER — CYCLOBENZAPRINE HCL 10 MG PO TABS: 10.0000 mg | ORAL_TABLET | Freq: Three times a day (TID) | ORAL | 1 refills | Status: DC | PRN

## 2017-10-08 MED ORDER — ONDANSETRON 4 MG PO TBDP
4.0000 mg | ORAL_TABLET | Freq: Once | ORAL | Status: AC
  Administered 2017-10-08: 4 mg via ORAL
  Filled 2017-10-08: qty 1

## 2017-10-08 MED ORDER — LABETALOL HCL 5 MG/ML IV SOLN
40.0000 mg | INTRAVENOUS | Status: DC | PRN
  Administered 2017-10-08: 40 mg via INTRAVENOUS
  Filled 2017-10-08: qty 8

## 2017-10-08 MED ORDER — LACTATED RINGERS IV SOLN
INTRAVENOUS | Status: DC
  Administered 2017-10-08: 15:00:00 via INTRAVENOUS

## 2017-10-08 MED ORDER — PROMETHAZINE HCL 25 MG RE SUPP: 25.0000 mg | Freq: Four times a day (QID) | RECTAL | 0 refills | Status: DC | PRN

## 2017-10-08 MED ORDER — METOCLOPRAMIDE HCL 10 MG PO TABS: 10.0000 mg | ORAL_TABLET | Freq: Three times a day (TID) | ORAL | 0 refills | Status: DC | PRN

## 2017-10-08 NOTE — MAU Note (Signed)
CRNA @ bedside for IV start, lab draw in pt's R foot.

## 2017-10-08 NOTE — MAU Note (Signed)
Pt called out, is writhing in bed in pain, now C/O severe HA, also L flank & abd pain continue.  Pt states she had a mild HA earlier, is now severe.  CNM informed.

## 2017-10-08 NOTE — MAU Note (Signed)
Flexeril & procardia taken to room, pt actively vomiting.

## 2017-10-08 NOTE — MAU Note (Signed)
Pt presents with c/o N&V and pain in left lower abdomen that radiates around from the back.  Had dysuria, currently denies.  Denies burning with urination, has increased frequency for last week.  Reports vomited 6x since 0200 this morning.  Denies VB.

## 2017-10-08 NOTE — Discharge Instructions (Signed)
-  Phenergan suppositories as soon as nausea and HA start; once feeling less nauseated take Benadryl (25 mg or 50 mg) and Reglan together.  -Message was sent to [facility] office for headache specialist appt.  -Continue Procardia daily

## 2017-10-08 NOTE — MAU Provider Note (Addendum)
History     CSN: [id number]  Arrival date and time: 10/08/17 1211   First Provider Initiated Contact with Patient 10/08/17 1255      Chief Complaint  Patient presents with  . Abdominal Pain   HPI Krystal [name] is a 27 y.o. G1P0 at [redacted]w[redacted]d who presents to MAU with multiple complaints including left flank pain, nausea and vomiting and headache. Per patient all problems occur together, all new onset this morning at around 9am.  Denies vaginal bleeding, leaking of fluid, fever, falls, or recent illness.    Pregnancy is complicated by chronic hypertension. Patient reports she took her Procardia at 10am today.  Flank pain New problem, onset this morning 9:00am. Pain is 8-10/10 left mid-abdomen at mid-axillary line and radiating around to left flank. Took 500 mg PO Tylenol at approximately 10am, did not provide relief. Aggravated by repositioning. Denies urinary symptoms.   Headache New problem. Pain 10/10. Pain is frontal, does not radiate. Denies aggravating or alleviating factors.  Nausea and Vomiting New problem. Onset today at 9:00am. Patient believes it is a side effect of flank pain. Estimates she has vomited 7-8 times today. Has not taken antiemetic. Denies aggravating or alleviating factors.  OB History    Gravida  1   Para      Term      Preterm      AB      Living        SAB      TAB      Ectopic      Multiple      Live Births              Past Medical History:  Diagnosis Date  . Anxiety   . Asthma   . Hypertension   . IC (interstitial cystitis)   . Kidney stones   . Migraines   . UTI (urinary tract infection)     Past Surgical History:  Procedure Laterality Date  . APPENDECTOMY    . 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 HAND RECONSTRUCTION Right   . INCISION AND DRAINAGE    . NASAL SEPTUM SURGERY      Family History  Problem Relation Age of Onset  . Hypertension Mother   . Hypertension Father     Social  History   Tobacco Use  . Smoking status: Former Research scientist (life sciences)  . Smokeless tobacco: Never Used  Substance Use Topics  . Alcohol use: Not Currently    Comment: bottle of wine a week  . Drug use: No    Allergies:  Allergies  Allergen Reactions  . Red Dye Other (See Comments)    ALL CAUSE PROBLEMS #40 IS WORST-MIGRAINES   . Compazine [Prochlorperazine Maleate] Other (See Comments)    TWITCHING, CANT STAY STILL. Pt states she can tolerate promethazine  . Other Other (See Comments)    FANSIDAR FOR MALARIA-ASTHMA  . Vicodin [Hydrocodone-Acetaminophen] Itching and Nausea And Vomiting  . Wheat Bran Other (See Comments)    GLUTEN SENSITIVE  . Reglan [Metoclopramide] Other (See Comments)    restless    Medications Prior to Admission  Medication Sig Dispense Refill Last Dose  . albuterol (PROVENTIL HFA;VENTOLIN HFA) 108 (90 Base) MCG/ACT inhaler Inhale 1 puff into the lungs every 6 (six) hours as needed for wheezing or shortness of breath.   Past Month at Unknown time  . doxylamine, Sleep, (UNISOM) 25 MG tablet Take 1 tablet (25 mg total) by  mouth at bedtime as needed (Nausea). 30 tablet 0 07/23/2017 at Unknown time  . famotidine (PEPCID) 20 MG tablet Take 1 tablet (20 mg total) by mouth 2 (two) times daily. 60 tablet 10   . NIFEdipine (PROCARDIA-XL/ADALAT-CC/NIFEDICAL-XL) 30 MG 24 hr tablet Take 2 tablets (60 mg total) by mouth daily. 60 tablet 1   . potassium chloride SA (K-DUR,KLOR-CON) 20 MEQ tablet Take 1 tablet (20 mEq total) by mouth 2 (two) times daily. 8 tablet 0   . Prenatal Vit-Fe Fumarate-FA (PRENATAL PO) Take 1 tablet by mouth daily.   07/23/2017 at Unknown time  . promethazine (PHENERGAN) 25 MG tablet Take 1 tablet (25 mg total) by mouth every 6 (six) hours as needed for nausea or vomiting. 30 tablet 0   . scopolamine (TRANSDERM-SCOP) 1 MG/3DAYS Place 1 patch (1.5 mg total) onto the skin every 3 (three) days. 10 patch 12   . vitamin B-6 (VITAMIN B-6) 50 MG tablet Take 1 tablet (50 mg  total) by mouth 3 (three) times daily as needed. (Patient taking differently: Take 50 mg by mouth 3 (three) times daily as needed (nausea). ) 30 tablet 0 07/23/2017 at Unknown time    Review of Systems  Respiratory: Negative for shortness of breath.   Gastrointestinal: Positive for nausea and vomiting. Negative for abdominal pain.  Genitourinary: Positive for flank pain. Negative for vaginal bleeding, vaginal discharge and vaginal pain.  Neurological: Positive for headaches.  All other systems reviewed and are negative.  Physical Exam   Blood pressure 129/79, pulse 82, temperature 98.1 F (36.7 C), temperature source Oral, resp. rate (!) 22, last menstrual period 06/03/2017, SpO2 100 %.  Physical Exam  Nursing note reviewed. Constitutional: She is oriented to person, place, and time. She appears well-developed and well-nourished.  Cardiovascular: Normal rate, regular rhythm and intact distal pulses.  Respiratory: Effort normal.  GI: There is tenderness. There is CVA tenderness.  Gravid  Neurological: She is alert and oriented to person, place, and time. She has normal reflexes.  Skin: Skin is warm and dry.  Psychiatric: She has a normal mood and affect. Her behavior is normal. Judgment and thought content normal.    MAU Course  Procedures  MDM -- Patient Vitals for the past 24 hrs:  BP Temp Temp src Pulse Resp SpO2  10/08/17 1646 129/79 - - 82 - -  10/08/17 1631 134/81 - - 84 - -  10/08/17 1616 (!) 151/100 - - 88 - -  10/08/17 1601 (!) 148/95 - - 79 - -  10/08/17 1546 (!) 150/95 - - 80 - -  10/08/17 1531 (!) 166/109 - - 92 - -  10/08/17 1516 (!) 158/104 - - 74 - -  10/08/17 1459 (!) 158/97 - - 95 - -  10/08/17 1419 (!) 165/96 - - 92 - -  10/08/17 1418 (!) 165/96 - - 92 - -  10/08/17 1255 (!) 160/110 - - (!) 107 (!) 22 -  10/08/17 1232 (!) 168/104 98.1 F (36.7 C) Oral 95 19 100 %   Orders Placed This Encounter  Procedures  . US Renal  . Urinalysis, Routine w reflex  microscopic  . CBC  . Comprehensive metabolic panel  . Protein / creatinine ratio, urine  . Vital signs  . Measure blood pressure  . ED EKG   Results for orders placed or performed during the hospital encounter of 10/08/17 (from the past 24 hour(s))  Urinalysis, Routine w reflex microscopic     Status: Abnormal  Collection Time: 10/08/17 12:47 PM  Result Value Ref Range   Color, Urine YELLOW YELLOW   APPearance HAZY (A) CLEAR   Specific Gravity, Urine 1.005 1.005 - 1.030   pH 8.0 5.0 - 8.0   Glucose, UA NEGATIVE NEGATIVE mg/dL   Hgb urine dipstick NEGATIVE NEGATIVE   Bilirubin Urine NEGATIVE NEGATIVE   Ketones, ur NEGATIVE NEGATIVE mg/dL   Protein, ur NEGATIVE NEGATIVE mg/dL   Nitrite NEGATIVE NEGATIVE   Leukocytes, UA MODERATE (A) NEGATIVE   RBC / HPF 0-5 0 - 5 RBC/hpf   WBC, UA 11-20 0 - 5 WBC/hpf   Bacteria, UA RARE (A) NONE SEEN   Squamous Epithelial / LPF 11-20 0 - 5  CBC     Status: Abnormal   Collection Time: 10/08/17  2:31 PM  Result Value Ref Range   WBC 12.2 (H) 4.0 - 10.5 K/uL   RBC 4.56 3.87 - 5.11 MIL/uL   Hemoglobin 15.3 (H) 12.0 - 15.0 g/dL   HCT 43.3 36.0 - 46.0 %   MCV 95.0 78.0 - 100.0 fL   MCH 33.6 26.0 - 34.0 pg   MCHC 35.3 30.0 - 36.0 g/dL   RDW 12.9 11.5 - 15.5 %   Platelets 285 150 - 400 K/uL  Comprehensive metabolic panel     Status: Abnormal   Collection Time: 10/08/17  2:31 PM  Result Value Ref Range   Sodium 136 135 - 145 mmol/L   Potassium 3.5 3.5 - 5.1 mmol/L   Chloride 105 98 - 111 mmol/L   CO2 19 (L) 22 - 32 mmol/L   Glucose, Bld 93 70 - 99 mg/dL   BUN 9 6 - 20 mg/dL   Creatinine, Ser 0.44 0.44 - 1.00 mg/dL   Calcium 9.7 8.9 - 10.3 mg/dL   Total Protein 6.9 6.5 - 8.1 g/dL   Albumin 4.1 3.5 - 5.0 g/dL   AST 20 15 - 41 U/L   ALT 16 0 - 44 U/L   Alkaline Phosphatase 47 38 - 126 U/L   Total Bilirubin 1.0 0.3 - 1.2 mg/dL   GFR calc non Af Amer >60 >60 mL/min   GFR calc Af Amer >60 >60 mL/min   Anion gap 12 5 - 15    Report given  to K. Ardean Larsen, CNM who assumes care of patient at this time.  Mallie Snooks, CNM 10/08/17  5:04 PM     On-coming CNM note:  -Patient's bp elevated; received 20 mg of labetalol, 40 mg of labetalol.  -Zofran 8 mg given dissolving tablets, which patient vomited up.   -EKG normal -Renal US is negative for renal problems.   -CBC and CMP normal, PCR pending.   -Patient states that she always has high blood pressure when she gets a headache this bad. Says that she has had multiple CT scans and they are always negative; she does not want a CT scan at this time. She wants her pain to be taken care and she thinks her BP will come down.  -1630: headache cocktail given with 50 mg of Benadryl as patient says that this worked for her in the past to combat the anxiety that Reglan gives her.  -Patient HA now a 3/10 and tolerated Sprite.    FHR is 156 by Doppler.   Assessment and Plan   1. Acute non intractable tension-type headache    2. Patient desires discharge; will discharge home with RX for Benadryl and Reglan to take at home together, plus phenergan.  3. Discussed in detail medication protocol; patient will also wait for appt at [facility] with Laurine Blazer.   4. All questions answered; patient stable for discharge.

## 2017-10-12 ENCOUNTER — Telehealth: Payer: Self-pay | Admitting: Radiology

## 2017-10-12 NOTE — Telephone Encounter (Signed)
left message for patient to call [facility] to schedule appointment with Allie Dimmer for New Headache appointment

## 2017-10-24 ENCOUNTER — Encounter (HOSPITAL_COMMUNITY): Payer: Self-pay | Admitting: Emergency Medicine

## 2017-10-24 ENCOUNTER — Emergency Department (HOSPITAL_COMMUNITY)
Admission: EM | Admit: 2017-10-24 | Discharge: 2017-10-25 | Disposition: A | Discharge status: Home / Self Care | Payer: Medicaid Other | Attending: Emergency Medicine | Admitting: Emergency Medicine

## 2017-10-24 ENCOUNTER — Other Ambulatory Visit: Payer: Self-pay

## 2017-10-24 DIAGNOSIS — R609 Edema, unspecified: Secondary | ICD-10-CM

## 2017-10-24 DIAGNOSIS — O10012 Pre-existing essential hypertension complicating pregnancy, second trimester: Secondary | ICD-10-CM | POA: Diagnosis not present

## 2017-10-24 DIAGNOSIS — Z79899 Other long term (current) drug therapy: Secondary | ICD-10-CM | POA: Diagnosis not present

## 2017-10-24 DIAGNOSIS — G44219 Episodic tension-type headache, not intractable: Secondary | ICD-10-CM | POA: Insufficient documentation

## 2017-10-24 DIAGNOSIS — O9989 Other specified diseases and conditions complicating pregnancy, childbirth and the puerperium: Secondary | ICD-10-CM | POA: Insufficient documentation

## 2017-10-24 DIAGNOSIS — R51 Headache: Secondary | ICD-10-CM | POA: Diagnosis not present

## 2017-10-24 DIAGNOSIS — O99512 Diseases of the respiratory system complicating pregnancy, second trimester: Secondary | ICD-10-CM | POA: Insufficient documentation

## 2017-10-24 DIAGNOSIS — Z87891 Personal history of nicotine dependence: Secondary | ICD-10-CM | POA: Diagnosis not present

## 2017-10-24 DIAGNOSIS — Z3A20 20 weeks gestation of pregnancy: Secondary | ICD-10-CM | POA: Diagnosis not present

## 2017-10-24 DIAGNOSIS — R600 Localized edema: Secondary | ICD-10-CM | POA: Insufficient documentation

## 2017-10-24 DIAGNOSIS — I158 Other secondary hypertension: Secondary | ICD-10-CM

## 2017-10-24 NOTE — ED Provider Notes (Signed)
[facility] EMERGENCY DEPARTMENT Provider Note  CSN: [id number] Arrival date & time: 10/24/17 2134  Chief Complaint(s) Hypertension; Headache; and [redacted] weeks pregnant  HPI Krystal [name] is a 27 y.o. female    Headache   This is a recurrent problem. The current episode started 6 to 12 hours ago. The problem occurs constantly. The problem has not changed since onset.Associated with: elevated BPs. The pain is located in the frontal region. The pain is moderate. The pain does not radiate. She has tried acetaminophen for the symptoms.   H/o HTN on nifedipine and methydopa.   Past Medical History Past Medical History:  Diagnosis Date  . Anxiety   . Asthma   . Hypertension   . IC (interstitial cystitis)   . Kidney stones   . Migraines   . Pregnant   . UTI (urinary tract infection)    Patient Active Problem List   Diagnosis Date Noted  . Chronic hypertension during pregnancy, antepartum 10/08/2017   Home Medication(s) Prior to Admission medications   Medication Sig Start Date End Date Taking? Authorizing Provider  albuterol (PROVENTIL HFA;VENTOLIN HFA) 108 (90 Base) MCG/ACT inhaler Inhale 1 puff into the lungs every 6 (six) hours as needed for wheezing or shortness of breath.    [provider]  cyclobenzaprine (FLEXERIL) 10 MG tablet Take 1 tablet (10 mg total) by mouth every 8 (eight) hours as needed for muscle spasms. 10/08/17   Starr Lake, CNM  diphenhydrAMINE (BENADRYL) 25 mg capsule Take 1 capsule (25 mg total) by mouth every 6 (six) hours as needed. 10/08/17   Starr Lake, CNM  doxylamine, Sleep, (UNISOM) 25 MG tablet Take 1 tablet (25 mg total) by mouth at bedtime as needed (Nausea). 07/18/17   Ward, Ozella Almond, PA-C  famotidine (PEPCID) 20 MG tablet Take 1 tablet (20 mg total) by mouth 2 (two) times daily. 07/24/17   Luvenia Redden, PA-C  metoCLOPramide (REGLAN) 10 MG tablet Take 1 tablet (10 mg total) by mouth  every 8 (eight) hours as needed for nausea. 10/08/17   Starr Lake, CNM  NIFEdipine (PROCARDIA-XL/ADALAT-CC/NIFEDICAL-XL) 30 MG 24 hr tablet Take 2 tablets (60 mg total) by mouth daily. 07/24/17   Luvenia Redden, PA-C  potassium chloride SA (K-DUR,KLOR-CON) 20 MEQ tablet Take 1 tablet (20 mEq total) by mouth 2 (two) times daily. 08/13/17   Doristine Devoid, PA-C  Prenatal Vit-Fe Fumarate-FA (PRENATAL PO) Take 1 tablet by mouth daily.    [provider]  promethazine (PHENERGAN) 25 MG suppository Place 1 suppository (25 mg total) rectally every 6 (six) hours as needed for nausea or vomiting. 10/08/17   Starr Lake, CNM  promethazine (PHENERGAN) 25 MG tablet Take 1 tablet (25 mg total) by mouth every 6 (six) hours as needed for nausea or vomiting. 07/24/17   Luvenia Redden, PA-C  scopolamine (TRANSDERM-SCOP) 1 MG/3DAYS Place 1 patch (1.5 mg total) onto the skin every 3 (three) days. 07/27/17   Luvenia Redden, PA-C  vitamin B-6 (VITAMIN B-6) 50 MG tablet Take 1 tablet (50 mg total) by mouth 3 (three) times daily as needed. Patient taking differently: Take 50 mg by mouth 3 (three) times daily as needed (nausea).  07/18/17   Ward, Ozella Almond, PA-C  Past Surgical History Past Surgical History:  Procedure Laterality Date  . APPENDECTOMY    . 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 HAND RECONSTRUCTION Right   . INCISION AND DRAINAGE    . NASAL SEPTUM SURGERY     Family History Family History  Problem Relation Age of Onset  . Hypertension Mother   . Hypertension Father     Social History Social History   Tobacco Use  . Smoking status: Former Research scientist (life sciences)  . Smokeless tobacco: Never Used  Substance Use Topics  . Alcohol use: Not Currently    Comment: bottle of wine a week  . Drug use: No   Allergies Red dye;  Compazine [prochlorperazine maleate]; Other; Vicodin [hydrocodone-acetaminophen]; Wheat bran; and Reglan [metoclopramide]  Review of Systems Review of Systems  Neurological: Positive for headaches.   All other systems are reviewed and are negative for acute change except as noted in the HPI  Physical Exam Vital Signs  I have reviewed the triage vital signs BP (!) 141/98   Pulse 82   Temp 98.2 F (36.8 C) (Oral)   Resp 16   LMP 06/03/2017 (Exact Date)   SpO2 99%   Physical Exam  Constitutional: She is oriented to person, place, and time. She appears well-developed and well-nourished. No distress.  HENT:  Head: Normocephalic and atraumatic.  Right Ear: External ear normal.  Left Ear: External ear normal.  Nose: Nose normal.  Eyes: Conjunctivae and EOM are normal. No scleral icterus.  Neck: Normal range of motion and phonation normal.  Cardiovascular: Normal rate and regular rhythm.  Pulmonary/Chest: Effort normal. No stridor. No respiratory distress.  Abdominal: She exhibits no distension.  Musculoskeletal: Normal range of motion. She exhibits no edema.       Arms: 2+ BLE pitting edema up to knees  Neurological: She is alert and oriented to person, place, and time.  Mental Status:  Alert and oriented to person, place, and time.  Attention and concentration normal.  Speech clear.  Recent memory is intact  Cranial Nerves:  II Visual Fields: Intact to confrontation. Visual fields intact. III, IV, VI: Pupils equal and reactive to light and near. Full eye movement without nystagmus  V Facial Sensation: Normal. No weakness of masticatory muscles  VII: No facial weakness or asymmetry  VIII Auditory Acuity: Grossly normal  IX/X: The uvula is midline; the palate elevates symmetrically  XI: Normal sternocleidomastoid and trapezius strength  XII: The tongue is midline. No atrophy or fasciculations.   Motor System: Muscle Strength: 5/5 and symmetric in the upper and lower  extremities. No pronation or drift.  Muscle Tone: Tone and muscle bulk are normal in the upper and lower extremities.   Reflexes: DTRs: 1+ and symmetrical in all four extremities. No Clonus Coordination: Intact finger-to-nose, heel-to-shin. No tremor.  Sensation: Intact to light touch, and pinprick.  Gait: Routine gait normal.   Skin: She is not diaphoretic.  Psychiatric: She has a normal mood and affect. Her behavior is normal.  Vitals reviewed.   ED Results and Treatments Labs (all labs ordered are listed, but only abnormal results are displayed) Labs Reviewed  CBC WITH DIFFERENTIAL/PLATELET - Abnormal; Notable for the following components:      Result Value   RBC 3.86 (*)    All other components within normal limits  COMPREHENSIVE METABOLIC PANEL - Abnormal; Notable for the following components:   Potassium 3.2 (*)    CO2 21 (*)    Total  Protein 6.0 (*)    Albumin 3.3 (*)    All other components within normal limits  URINALYSIS, ROUTINE W REFLEX MICROSCOPIC - Abnormal; Notable for the following components:   APPearance HAZY (*)    Ketones, ur 5 (*)    All other components within normal limits  MAGNESIUM                                                                                                                         EKG  EKG Interpretation  Date/Time:    Ventricular Rate:    PR Interval:    QRS Duration:   QT Interval:    QTC Calculation:   R Axis:     Text Interpretation:        Radiology No results found. Pertinent labs & imaging results that were available during my care of the patient were reviewed by me and considered in my medical decision making (see chart for details).  Medications Ordered in ED Medications  acetaminophen (TYLENOL) tablet 1,000 mg (has no administration in time range)                                                                                                                                     Procedures Procedures  (including critical care time)  Medical Decision Making / ED Course I have reviewed the nursing notes for this encounter and the patient's prior records (if available in EHR or on provided paperwork).    Non focal neuro exam. No recent head trauma. No fever. Doubt meningitis. Doubt intracranial bleed. Doubt IIH. No indication for imaging.   Given the fact this patient is [redacted] weeks pregnant and has peripheral edema, also considering preeclampsia.  Screening labs without thrombocytopenia, anemia, renal insufficiency, elevated LFTs.  No proteinuria.  Blood pressure improved without intervention.  Provided with Tylenol.  The patient appears reasonably screened and/or stabilized for discharge and I doubt any other medical condition or other [facility] requiring further screening, evaluation, or treatment in the ED at this time prior to discharge.  The patient is safe for discharge with strict return precautions.    Final Clinical Impression(s) / ED Diagnoses Final diagnoses:  Episodic tension-type headache, not intractable  Other secondary hypertension  Peripheral edema  [redacted] weeks gestation of pregnancy    Disposition: Discharge  Condition: Good  I have discussed the results, Dx and Tx plan with  the patient who expressed understanding and agree(s) with the plan. Discharge instructions discussed at great length. The patient was given strict return precautions who verbalized understanding of the instructions. No further questions at time of discharge.    ED Discharge Orders    None       Follow Up: Ronita Hipps, MD [place] [phone]  Schedule an appointment as soon as possible for a visit  As needed     This chart was dictated using voice recognition software.  Despite best efforts to proofread,  errors can occur which can change the documentation meaning.   Fatima Blank, MD 10/25/17 [phone]

## 2017-10-24 NOTE — ED Triage Notes (Signed)
Pt states she is [redacted] weeks pregnant.  C/o frontal headache since noon today.  Reports BP 175/112 at home.  Pt states she called OB in [place] and was told to come to ED.

## 2017-10-25 LAB — CBC WITH DIFFERENTIAL/PLATELET
Abs Immature Granulocytes: 0.1 10*3/uL (ref 0.0–0.1)
Basophils Absolute: 0 10*3/uL (ref 0.0–0.1)
Basophils Relative: 0 %
Eosinophils Absolute: 0.2 10*3/uL (ref 0.0–0.7)
Eosinophils Relative: 2 %
HCT: 36.5 % (ref 36.0–46.0)
Hemoglobin: 13 g/dL (ref 12.0–15.0)
Immature Granulocytes: 1 %
Lymphocytes Relative: 24 %
Lymphs Abs: 2.2 10*3/uL (ref 0.7–4.0)
MCH: 33.7 pg (ref 26.0–34.0)
MCHC: 35.6 g/dL (ref 30.0–36.0)
MCV: 94.6 fL (ref 78.0–100.0)
Monocytes Absolute: 0.9 10*3/uL (ref 0.1–1.0)
Monocytes Relative: 9 %
Neutro Abs: 6 10*3/uL (ref 1.7–7.7)
Neutrophils Relative %: 64 %
Platelets: 245 10*3/uL (ref 150–400)
RBC: 3.86 MIL/uL — ABNORMAL LOW (ref 3.87–5.11)
RDW: 11.9 % (ref 11.5–15.5)
WBC: 9.4 10*3/uL (ref 4.0–10.5)

## 2017-10-25 LAB — COMPREHENSIVE METABOLIC PANEL
ALT: 16 U/L (ref 0–44)
AST: 18 U/L (ref 15–41)
Albumin: 3.3 g/dL — ABNORMAL LOW (ref 3.5–5.0)
Alkaline Phosphatase: 47 U/L (ref 38–126)
Anion gap: 9 (ref 5–15)
BUN: 10 mg/dL (ref 6–20)
CO2: 21 mmol/L — ABNORMAL LOW (ref 22–32)
Calcium: 9.3 mg/dL (ref 8.9–10.3)
Chloride: 106 mmol/L (ref 98–111)
Creatinine, Ser: 0.83 mg/dL (ref 0.44–1.00)
GFR calc Af Amer: >60 mL/min (ref >60)
GFR calc non Af Amer: >60 mL/min (ref >60)
Glucose, Bld: 96 mg/dL (ref 70–99)
Potassium: 3.2 mmol/L — ABNORMAL LOW (ref 3.5–5.1)
Sodium: 136 mmol/L (ref 135–145)
Total Bilirubin: 0.6 mg/dL (ref 0.3–1.2)
Total Protein: 6 g/dL — ABNORMAL LOW (ref 6.5–8.1)

## 2017-10-25 LAB — URINALYSIS, ROUTINE W REFLEX MICROSCOPIC
Bilirubin Urine: NEGATIVE
Glucose, UA: NEGATIVE mg/dL
Hgb urine dipstick: NEGATIVE
Ketones, ur: 5 mg/dL — AB
Leukocytes, UA: NEGATIVE
Nitrite: NEGATIVE
Protein, ur: NEGATIVE mg/dL
Specific Gravity, Urine: 1.021 (ref 1.005–1.030)
pH: 5 (ref 5.0–8.0)

## 2017-10-25 LAB — MAGNESIUM: Magnesium: 1.8 mg/dL (ref 1.7–2.4)

## 2017-10-25 MED ORDER — ACETAMINOPHEN 500 MG PO TABS: 1000.0000 mg | ORAL_TABLET | Freq: Once | ORAL | Status: DC

## 2017-11-24 ENCOUNTER — Other Ambulatory Visit: Payer: Self-pay

## 2017-11-24 ENCOUNTER — Inpatient Hospital Stay (HOSPITAL_COMMUNITY)
Admission: AD | Admit: 2017-11-24 | Discharge: 2017-11-24 | Disposition: A | Discharge status: Home / Self Care | Payer: Medicaid Other | Source: Ambulatory Visit | Attending: Family Medicine | Admitting: Family Medicine

## 2017-11-24 ENCOUNTER — Encounter (HOSPITAL_COMMUNITY): Payer: Self-pay | Admitting: *Deleted

## 2017-11-24 DIAGNOSIS — R51 Headache: Secondary | ICD-10-CM | POA: Diagnosis present

## 2017-11-24 DIAGNOSIS — Z87891 Personal history of nicotine dependence: Secondary | ICD-10-CM | POA: Diagnosis not present

## 2017-11-24 DIAGNOSIS — O10012 Pre-existing essential hypertension complicating pregnancy, second trimester: Secondary | ICD-10-CM | POA: Insufficient documentation

## 2017-11-24 DIAGNOSIS — O10912 Unspecified pre-existing hypertension complicating pregnancy, second trimester: Secondary | ICD-10-CM | POA: Diagnosis not present

## 2017-11-24 DIAGNOSIS — Z3689 Encounter for other specified antenatal screening: Secondary | ICD-10-CM

## 2017-11-24 DIAGNOSIS — Z3A24 24 weeks gestation of pregnancy: Secondary | ICD-10-CM | POA: Insufficient documentation

## 2017-11-24 LAB — CBC WITH DIFFERENTIAL/PLATELET
Basophils Absolute: 0.1 10*3/uL (ref 0.0–0.1)
Basophils Relative: 0 %
Eosinophils Absolute: 0.3 10*3/uL (ref 0.0–0.7)
Eosinophils Relative: 2 %
HCT: 39.7 % (ref 36.0–46.0)
Hemoglobin: 14.3 g/dL (ref 12.0–15.0)
Lymphocytes Relative: 18 %
Lymphs Abs: 2.2 10*3/uL (ref 0.7–4.0)
MCH: 33.3 pg (ref 26.0–34.0)
MCHC: 36 g/dL (ref 30.0–36.0)
MCV: 92.3 fL (ref 78.0–100.0)
Monocytes Absolute: 1.1 10*3/uL — ABNORMAL HIGH (ref 0.1–1.0)
Monocytes Relative: 9 %
Neutro Abs: 8.3 10*3/uL — ABNORMAL HIGH (ref 1.7–7.7)
Neutrophils Relative %: 71 %
Platelets: 191 10*3/uL (ref 150–400)
RBC: 4.3 MIL/uL (ref 3.87–5.11)
RDW: 12.2 % (ref 11.5–15.5)
WBC: 11.9 10*3/uL — ABNORMAL HIGH (ref 4.0–10.5)

## 2017-11-24 LAB — COMPREHENSIVE METABOLIC PANEL
ALT: 35 U/L (ref 0–44)
AST: 39 U/L (ref 15–41)
Albumin: 3.7 g/dL (ref 3.5–5.0)
Alkaline Phosphatase: 52 U/L (ref 38–126)
Anion gap: 9 (ref 5–15)
BUN: 9 mg/dL (ref 6–20)
CO2: 23 mmol/L (ref 22–32)
Calcium: 9.6 mg/dL (ref 8.9–10.3)
Chloride: 105 mmol/L (ref 98–111)
Creatinine, Ser: 0.6 mg/dL (ref 0.44–1.00)
GFR calc Af Amer: >60 mL/min (ref >60)
GFR calc non Af Amer: >60 mL/min (ref >60)
Glucose, Bld: 83 mg/dL (ref 70–99)
Potassium: 3.8 mmol/L (ref 3.5–5.1)
Sodium: 137 mmol/L (ref 135–145)
Total Bilirubin: 0.7 mg/dL (ref 0.3–1.2)
Total Protein: 6.2 g/dL — ABNORMAL LOW (ref 6.5–8.1)

## 2017-11-24 LAB — URINALYSIS, ROUTINE W REFLEX MICROSCOPIC
Bilirubin Urine: NEGATIVE
Glucose, UA: NEGATIVE mg/dL
Hgb urine dipstick: NEGATIVE
Ketones, ur: 5 mg/dL — AB
Nitrite: NEGATIVE
Protein, ur: 30 mg/dL — AB
Specific Gravity, Urine: 1.023 (ref 1.005–1.030)
pH: 6 (ref 5.0–8.0)

## 2017-11-24 LAB — PROTEIN / CREATININE RATIO, URINE
Creatinine, Urine: 288 mg/dL
Protein Creatinine Ratio: 0.08 mg/mg{Cre} (ref 0.00–0.15)
Total Protein, Urine: 22 mg/dL

## 2017-11-24 MED ORDER — ACETAMINOPHEN 500 MG PO TABS
1000.0000 mg | ORAL_TABLET | Freq: Once | ORAL | Status: AC
  Administered 2017-11-24: 1000 mg via ORAL
  Filled 2017-11-24: qty 2

## 2017-11-24 MED ORDER — HYDRALAZINE HCL 20 MG/ML IJ SOLN: 10.0000 mg | INTRAMUSCULAR | Status: DC | PRN

## 2017-11-24 MED ORDER — NIFEDIPINE 10 MG PO CAPS
20.0000 mg | ORAL_CAPSULE | Freq: Once | ORAL | Status: AC
  Administered 2017-11-24: 20 mg via ORAL
  Filled 2017-11-24: qty 2

## 2017-11-24 MED ORDER — LABETALOL HCL 200 MG PO TABS
400.0000 mg | ORAL_TABLET | Freq: Once | ORAL | Status: AC
  Administered 2017-11-24: 400 mg via ORAL
  Filled 2017-11-24: qty 2

## 2017-11-24 MED ORDER — ONDANSETRON 4 MG PO TBDP
4.0000 mg | ORAL_TABLET | Freq: Once | ORAL | Status: AC
  Administered 2017-11-24: 4 mg via ORAL
  Filled 2017-11-24: qty 1

## 2017-11-24 MED ORDER — NIFEDIPINE 10 MG PO CAPS
20.0000 mg | ORAL_CAPSULE | ORAL | Status: DC | PRN
  Administered 2017-11-24: 20 mg via ORAL
  Filled 2017-11-24: qty 2

## 2017-11-24 MED ORDER — LACTATED RINGERS IV SOLN: INTRAVENOUS | Status: DC

## 2017-11-24 MED ORDER — HYDROXYZINE HCL 50 MG/ML IM SOLN
50.0000 mg | Freq: Once | INTRAMUSCULAR | Status: AC
  Administered 2017-11-24: 50 mg via INTRAMUSCULAR
  Filled 2017-11-24: qty 1

## 2017-11-24 MED ORDER — LABETALOL HCL 5 MG/ML IV SOLN: 40.0000 mg | INTRAVENOUS | Status: DC | PRN

## 2017-11-24 MED ORDER — LABETALOL HCL 5 MG/ML IV SOLN
20.0000 mg | INTRAVENOUS | Status: DC | PRN
  Filled 2017-11-24: qty 4

## 2017-11-24 MED ORDER — NIFEDIPINE 10 MG PO CAPS
10.0000 mg | ORAL_CAPSULE | ORAL | Status: DC | PRN
  Administered 2017-11-24: 10 mg via ORAL
  Filled 2017-11-24: qty 1

## 2017-11-24 MED ORDER — LABETALOL HCL 100 MG PO TABS: 400.0000 mg | ORAL_TABLET | Freq: Two times a day (BID) | ORAL | 3 refills | Status: DC

## 2017-11-24 MED ORDER — LABETALOL HCL 5 MG/ML IV SOLN: 80.0000 mg | INTRAVENOUS | Status: DC | PRN

## 2017-11-24 NOTE — MAU Note (Signed)
IV attempt x 2 in R antecubital, unsuccessful.  Second RN to attempt IV start, po BP med given.

## 2017-11-24 NOTE — MAU Note (Signed)
Hx of high BP, on 2 different meds

## 2017-11-24 NOTE — MAU Note (Signed)
CRNA here to attempt IV start.

## 2017-11-24 NOTE — Discharge Instructions (Signed)
Preeclampsia and Eclampsia °Preeclampsia is a serious condition that develops only during pregnancy. It is also called toxemia of pregnancy. This condition causes high blood pressure along with other symptoms, such as swelling and headaches. These symptoms may develop as the condition gets worse. Preeclampsia may occur at 20 weeks of pregnancy or later. °Diagnosing and treating preeclampsia early is very important. If not treated early, it can cause serious problems for you and your baby. One problem it can lead to is eclampsia, which is a condition that causes muscle jerking or shaking (convulsions or seizures) in the mother. Delivering your baby is the best treatment for preeclampsia or eclampsia. Preeclampsia and eclampsia symptoms usually go away after your baby is born. °What are the causes? °The cause of preeclampsia is not known. °What increases the risk? °The following risk factors make you more likely to develop preeclampsia: °· Being pregnant for the first time. °· Having had preeclampsia during a past pregnancy. °· Having a family history of preeclampsia. °· Having high blood pressure. °· Being pregnant with twins or triplets. °· Being 35 or older. °· Being African-American. °· Having kidney disease or diabetes. °· Having medical conditions such as lupus or blood diseases. °· Being very overweight (obese). ° °What are the signs or symptoms? °The earliest signs of preeclampsia are: °· High blood pressure. °· Increased protein in your urine. Your health care provider will check for this at every visit before you give birth (prenatal visit). ° °Other symptoms that may develop as the condition gets worse include: °· Severe headaches. °· Sudden weight gain. °· Swelling of the hands, face, legs, and feet. °· Nausea and vomiting. °· Vision problems, such as blurred or double vision. °· Numbness in the face, arms, legs, and feet. °· Urinating less than usual. °· Dizziness. °· Slurred speech. °· Abdominal pain,  especially upper abdominal pain. °· Convulsions or seizures. ° °Symptoms generally go away after giving birth. °How is this diagnosed? °There are no screening tests for preeclampsia. Your health care provider will ask you about symptoms and check for signs of preeclampsia during your prenatal visits. You may also have tests that include: °· Urine tests. °· Blood tests. °· Checking your blood pressure. °· Monitoring your baby’s heart rate. °· Ultrasound. ° °How is this treated? °You and your health care provider will determine the treatment approach that is best for you. Treatment may include: °· Having more frequent prenatal exams to check for signs of preeclampsia, if you have an increased risk for preeclampsia. °· Bed rest. °· Reducing how much salt (sodium) you eat. °· Medicine to lower your blood pressure. °· Staying in the hospital, if your condition is severe. There, treatment will focus on controlling your blood pressure and the amount of fluids in your body (fluid retention). °· You may need to take medicine (magnesium sulfate) to prevent seizures. This medicine may be given as an injection or through an IV tube. °· Delivering your baby early, if your condition gets worse. You may have your labor started with medicine (induced), or you may have a cesarean delivery. ° °Follow these instructions at home: °Eating and drinking ° °· Drink enough fluid to keep your urine clear or pale yellow. °· Eat a healthy diet that is low in sodium. Do not add salt to your food. Check nutrition labels to see how much sodium a food or beverage contains. °· Avoid caffeine. °Lifestyle °· Do not use any products that contain nicotine or tobacco, such as cigarettes   and e-cigarettes. If you need help quitting, ask your health care provider. °· Do not use alcohol or drugs. °· Avoid stress as much as possible. Rest and get plenty of sleep. °General instructions °· Take over-the-counter and prescription medicines only as told by your  health care provider. °· When lying down, lie on your side. This keeps pressure off of your baby. °· When sitting or lying down, raise (elevate) your feet. Try putting some pillows underneath your lower legs. °· Exercise regularly. Ask your health care provider what kinds of exercise are best for you. °· Keep all follow-up and prenatal visits as told by your health care provider. This is important. °How is this prevented? °To prevent preeclampsia or eclampsia from developing during another pregnancy: °· Get proper medical care during pregnancy. Your health care provider may be able to prevent preeclampsia or diagnose and treat it early. °· Your health care provider may have you take a low-dose aspirin or a calcium supplement during your next pregnancy. °· You may have tests of your blood pressure and kidney function after giving birth. °· Maintain a healthy weight. Ask your health care provider for help managing weight gain during pregnancy. °· Work with your health care provider to manage any long-term (chronic) health conditions you have, such as diabetes or kidney problems. ° °Contact a health care provider if: °· You gain more weight than expected. °· You have headaches. °· You have nausea or vomiting. °· You have abdominal pain. °· You feel dizzy or light-headed. °Get help right away if: °· You develop sudden or severe swelling anywhere in your body. This usually happens in the legs. °· You gain 5 lbs (2.3 kg) or more during one week. °· You have severe: °? Abdominal pain. °? Headaches. °? Dizziness. °? Vision problems. °? Confusion. °? Nausea or vomiting. °· You have a seizure. °· You have trouble moving any part of your body. °· You develop numbness in any part of your body. °· You have trouble speaking. °· You have any abnormal bleeding. °· You pass out. °This information is not intended to replace advice given to you by your health care provider. Make sure you discuss any questions you have with your health  care provider. °Document Released: 02/08/2000 Document Revised: 10/09/2015 Document Reviewed: 09/17/2015 °Elsevier Interactive Patient Education © 2018 [employer] ° °

## 2017-11-24 NOTE — MAU Provider Note (Signed)
History     CSN: [id number]  Arrival date and time: 11/24/17 1715   First Provider Initiated Contact with Patient 11/24/17 1752      Chief Complaint  Patient presents with  . Headache  . Abdominal Pain  . Emesis  . Nausea  . Dizziness   HPI  Krystal [name] is a 27 y.o. G1P0 at [redacted]w[redacted]d who presents to MAU with multiple complaints. Patient lives in [place] but receives prenatal care in [place]. Patient was advised by [facility] Provider to seek [facility] at [facility] for elevated pressures and physical symptoms at home today.  Denies vaginal bleeding, leaking of fluid, decreased fetal movement, fever, falls, or recent illness.    Elevated BP Patient has an existing diagnosis of chronic hypertension. Managed with Methyldopa 500 mg TID and Labetalol 100 mg BID. Patient states she has not taken either medication today. Also states she took medication yesterday, then vomited and saw both pills in her emesis.   Headache and Dizziness These are new problems, onset this morning. Patient endorses two episodes of dizziness earlier today. Headache is frontal, bilateral, does not radiate. Took 500 mg Tylenol at approximately 4pm. Did not provide relief.  Upper abdominal pain This is a new problem. Onset Saturday. Patient endorses pain 3-6/10 across the entire upper edge of her abdoen. States she called her mother for help and her mother told her to rest because the discomfort was due to fetal positioning. Patient states she repositioned based on her mother's advice and discomfort resolved without further intervention.  Nausea/vomiting This is a recurring problem. Patient endorses regular nausea and vomiting earlier in her pregnancy which she successfully managed with subingual Zofran. Nausea and vomiting has restart this week but patient states she is out of tabs and does not have any refills available on her existing prescription.  OB History    Gravida  1   Para      Term      Preterm      AB      Living        SAB      TAB      Ectopic      Multiple      Live Births              Past Medical History:  Diagnosis Date  . Anxiety   . Asthma   . Hypertension   . IC (interstitial cystitis)   . Kidney stones   . Migraines   . Pregnant   . UTI (urinary tract infection)     Past Surgical History:  Procedure Laterality Date  . APPENDECTOMY    . 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 HAND RECONSTRUCTION Right   . INCISION AND DRAINAGE    . NASAL SEPTUM SURGERY      Family History  Problem Relation Age of Onset  . Hypertension Mother   . Hypertension Father     Social History   Tobacco Use  . Smoking status: Former Research scientist (life sciences)  . Smokeless tobacco: Never Used  Substance Use Topics  . Alcohol use: Not Currently    Comment: bottle of wine a week  . Drug use: No    Allergies:  Allergies  Allergen Reactions  . Red Dye Other (See Comments)    ALL CAUSE PROBLEMS #40 IS WORST-MIGRAINES   . Compazine [Prochlorperazine Maleate] Other (See Comments)    TWITCHING, CANT STAY STILL. Pt states she can tolerate promethazine  .  Other Other (See Comments)    FANSIDAR FOR MALARIA-ASTHMA  . Vicodin [Hydrocodone-Acetaminophen] Itching and Nausea And Vomiting  . Wheat Bran Other (See Comments)    GLUTEN SENSITIVE  . Reglan [Metoclopramide] Other (See Comments)    restless    Medications Prior to Admission  Medication Sig Dispense Refill Last Dose  . albuterol (PROVENTIL HFA;VENTOLIN HFA) 108 (90 Base) MCG/ACT inhaler Inhale 1 puff into the lungs every 6 (six) hours as needed for wheezing or shortness of breath.   Past Month at Unknown time  . cyclobenzaprine (FLEXERIL) 10 MG tablet Take 1 tablet (10 mg total) by mouth every 8 (eight) hours as needed for muscle spasms. 30 tablet 1   . diphenhydrAMINE (BENADRYL) 25 mg capsule Take 1 capsule (25 mg total) by mouth every 6 (six) hours as needed. 30 capsule 0   . doxylamine, Sleep, (UNISOM) 25 MG  tablet Take 1 tablet (25 mg total) by mouth at bedtime as needed (Nausea). 30 tablet 0 07/23/2017 at Unknown time  . famotidine (PEPCID) 20 MG tablet Take 1 tablet (20 mg total) by mouth 2 (two) times daily. 60 tablet 10   . metoCLOPramide (REGLAN) 10 MG tablet Take 1 tablet (10 mg total) by mouth every 8 (eight) hours as needed for nausea. 1 tablet 0   . NIFEdipine (PROCARDIA-XL/ADALAT-CC/NIFEDICAL-XL) 30 MG 24 hr tablet Take 2 tablets (60 mg total) by mouth daily. 60 tablet 1   . potassium chloride SA (K-DUR,KLOR-CON) 20 MEQ tablet Take 1 tablet (20 mEq total) by mouth 2 (two) times daily. 8 tablet 0   . Prenatal Vit-Fe Fumarate-FA (PRENATAL PO) Take 1 tablet by mouth daily.   07/23/2017 at Unknown time  . promethazine (PHENERGAN) 25 MG suppository Place 1 suppository (25 mg total) rectally every 6 (six) hours as needed for nausea or vomiting. 12 each 0   . promethazine (PHENERGAN) 25 MG tablet Take 1 tablet (25 mg total) by mouth every 6 (six) hours as needed for nausea or vomiting. 30 tablet 0   . scopolamine (TRANSDERM-SCOP) 1 MG/3DAYS Place 1 patch (1.5 mg total) onto the skin every 3 (three) days. 10 patch 12   . vitamin B-6 (VITAMIN B-6) 50 MG tablet Take 1 tablet (50 mg total) by mouth 3 (three) times daily as needed. (Patient taking differently: Take 50 mg by mouth 3 (three) times daily as needed (nausea). ) 30 tablet 0 07/23/2017 at Unknown time    Review of Systems  Constitutional: Negative for fever.  Respiratory: Negative for chest tightness and shortness of breath.   Gastrointestinal: Positive for abdominal pain, nausea and vomiting.  Genitourinary: Negative for difficulty urinating, dyspareunia, dysuria, flank pain, vaginal bleeding, vaginal discharge and vaginal pain.  Musculoskeletal: Negative for back pain.  Neurological: Positive for dizziness and headaches.  All other systems reviewed and are negative.  Physical Exam   Blood pressure (!) 179/120, pulse 89, temperature 98.2 F  (36.8 C), temperature source Oral, resp. rate 18, weight 104.7 kg, last menstrual period 06/03/2017, SpO2 99 %.  Physical Exam  Nursing note and vitals reviewed. Constitutional: She is oriented to person, place, and time. She appears well-developed and well-nourished.  Cardiovascular: Normal rate.  Respiratory: Effort normal and breath sounds normal. No respiratory distress. She has no wheezes. She has no rales. She exhibits no tenderness.  GI: Soft. Bowel sounds are normal. She exhibits no distension. There is no tenderness. There is no rebound and no guarding.  Gravid  Genitourinary: No vaginal discharge found.  Neurological:  She is alert and oriented to person, place, and time. She has normal reflexes.  Skin: Skin is warm and dry.  Psychiatric: She has a normal mood and affect. Her behavior is normal. Judgment and thought content normal.    MAU Course  Procedures  MDM --Hypertension protocol initiated --Unable to achieve IV access despite multiple attempts including two Anesthesia personnel --Medication management beyond HTN protocol discussed with Dr Nehemiah Settle  --Reactive fetal tracing: baseline 155, moderate variability, positive accelerations, rare variable decels as expected for GA --Toco: quiet --Patient endorses inconsistent adherence to prescribed antihypertensives r/t nausea/vomiting episodes  Patient Vitals for the past 24 hrs:  BP Temp Temp src Pulse Resp SpO2 Weight  11/24/17 2034 - 98.4 F (36.9 C) Oral - - - -  11/24/17 2015 134/69 - - 94 - - -  11/24/17 2000 (!) 161/90 - - 97 - - -  11/24/17 1956 (!) 167/99 - - - - - -  11/24/17 1945 (!) 167/99 - - 90 - - -  11/24/17 1930 (!) 157/100 - - 98 - - -  11/24/17 1923 (!) 160/96 - - - - - -  11/24/17 1915 (!) 160/96 - - (!) 104 - - -  11/24/17 1845 (!) 146/90 - - (!) 110 - - -  11/24/17 1830 (!) 151/89 - - (!) 124 - - -  11/24/17 1816 (!) 180/114 - - 76 - - -  11/24/17 1810 (!) 179/120 - - 89 - - -  11/24/17 1743 (!)  165/114 - - 85 - - -  11/24/17 1724 (!) 160/116 98.2 F (36.8 C) Oral 81 18 99 % 104.7 kg    Results for orders placed or performed during the hospital encounter of 11/24/17 (from the past 24 hour(s))  Comprehensive metabolic panel     Status: Abnormal   Collection Time: 11/24/17  5:57 PM  Result Value Ref Range   Sodium 137 135 - 145 mmol/L   Potassium 3.8 3.5 - 5.1 mmol/L   Chloride 105 98 - 111 mmol/L   CO2 23 22 - 32 mmol/L   Glucose, Bld 83 70 - 99 mg/dL   BUN 9 6 - 20 mg/dL   Creatinine, Ser 0.60 0.44 - 1.00 mg/dL   Calcium 9.6 8.9 - 10.3 mg/dL   Total Protein 6.2 (L) 6.5 - 8.1 g/dL   Albumin 3.7 3.5 - 5.0 g/dL   AST 39 15 - 41 U/L   ALT 35 0 - 44 U/L   Alkaline Phosphatase 52 38 - 126 U/L   Total Bilirubin 0.7 0.3 - 1.2 mg/dL   GFR calc non Af Amer >60 >60 mL/min   GFR calc Af Amer >60 >60 mL/min   Anion gap 9 5 - 15  Protein / creatinine ratio, urine     Status: None   Collection Time: 11/24/17  6:00 PM  Result Value Ref Range   Creatinine, Urine 288.00 mg/dL   Total Protein, Urine 22 mg/dL   Protein Creatinine Ratio 0.08 0.00 - 0.15 mg/mg[Cre]  Urinalysis, Routine w reflex microscopic     Status: Abnormal   Collection Time: 11/24/17  6:03 PM  Result Value Ref Range   Color, Urine YELLOW YELLOW   APPearance CLOUDY (A) CLEAR   Specific Gravity, Urine 1.023 1.005 - 1.030   pH 6.0 5.0 - 8.0   Glucose, UA NEGATIVE NEGATIVE mg/dL   Hgb urine dipstick NEGATIVE NEGATIVE   Bilirubin Urine NEGATIVE NEGATIVE   Ketones, ur 5 (A) NEGATIVE mg/dL  Protein, ur 30 (A) NEGATIVE mg/dL   Nitrite NEGATIVE NEGATIVE   Leukocytes, UA SMALL (A) NEGATIVE   RBC / HPF 0-5 0 - 5 RBC/hpf   WBC, UA 11-20 0 - 5 WBC/hpf   Bacteria, UA FEW (A) NONE SEEN   Squamous Epithelial / LPF 21-50 0 - 5   Mucus PRESENT   CBC with Differential/Platelet     Status: Abnormal   Collection Time: 11/24/17  7:44 PM  Result Value Ref Range   WBC 11.9 (H) 4.0 - 10.5 K/uL   RBC 4.30 3.87 - 5.11 MIL/uL    Hemoglobin 14.3 12.0 - 15.0 g/dL   HCT 39.7 36.0 - 46.0 %   MCV 92.3 78.0 - 100.0 fL   MCH 33.3 26.0 - 34.0 pg   MCHC 36.0 30.0 - 36.0 g/dL   RDW 12.2 11.5 - 15.5 %   Platelets 191 150 - 400 K/uL   Neutrophils Relative % 71 %   Neutro Abs 8.3 (H) 1.7 - 7.7 K/uL   Lymphocytes Relative 18 %   Lymphs Abs 2.2 0.7 - 4.0 K/uL   Monocytes Relative 9 %   Monocytes Absolute 1.1 (H) 0.1 - 1.0 K/uL   Eosinophils Relative 2 %   Eosinophils Absolute 0.3 0.0 - 0.7 K/uL   Basophils Relative 0 %   Basophils Absolute 0.1 0.0 - 0.1 K/uL   Meds ordered this encounter  Medications   . NIFEdipine (PROCARDIA) capsule 10 mg   . NIFEdipine (PROCARDIA) capsule 20 mg   . NIFEdipine (PROCARDIA) capsule 20 mg   . labetalol (NORMODYNE,TRANDATE) injection 40 mg  . ondansetron (ZOFRAN-ODT) disintegrating tablet 4 mg  . acetaminophen (TYLENOL) tablet 1,000 mg  . hydrOXYzine (VISTARIL) injection 50 mg  . NIFEdipine (PROCARDIA) capsule 20 mg  . labetalol (NORMODYNE) tablet 400 mg    Assessment and Plan  --27 y.o. G1P0 at [redacted]w[redacted]d  --Chronic hypertension, negative PEC labs --Normotensive with medication administered in MAU --Headache reduced to 3/10 with medication administered in MAU --Reactive fetal tracing --Rx for Labetalol 400 mg BID to patient pharmacy --Discharge home in stable condition with preterm labor and preeclampsia precautions --Plan of care, medication management and follow up discussed with Dr. Nehemiah Settle  F/U: Patient reports OB appt tomorrow at [place], [place] 11/24/2017, 8:58 PM

## 2017-11-24 NOTE — MAU Note (Signed)
Third IV attempt R antecubital - unsuccessful.  CRNA contacted for IV start.

## 2017-11-24 NOTE — MAU Note (Signed)
Been having really bad upper abd pain, started Sunday evening.  Started vomiting today, having dizzy spells. Started with a headache about an hour ago, having blurring. Denies increase in swelling.  Some urgency with urination.

## 2017-11-26 LAB — CULTURE, OB URINE: Culture: <10000 — AB

## 2017-12-06 ENCOUNTER — Encounter (HOSPITAL_COMMUNITY)
Admission: AD | Disposition: A | Discharge status: Home / Self Care | Payer: Self-pay | Source: Home / Self Care | Attending: Obstetrics and Gynecology

## 2017-12-06 ENCOUNTER — Observation Stay (HOSPITAL_BASED_OUTPATIENT_CLINIC_OR_DEPARTMENT_OTHER): Payer: Medicaid Other

## 2017-12-06 ENCOUNTER — Observation Stay (HOSPITAL_COMMUNITY): Payer: Medicaid Other | Admitting: Anesthesiology

## 2017-12-06 ENCOUNTER — Inpatient Hospital Stay (HOSPITAL_COMMUNITY)
Admission: AD | Admit: 2017-12-06 | Discharge: 2017-12-11 | DRG: 788 | Disposition: A | Discharge status: Home / Self Care | Payer: Medicaid Other | Attending: Obstetrics and Gynecology | Admitting: Obstetrics and Gynecology

## 2017-12-06 ENCOUNTER — Other Ambulatory Visit: Payer: Self-pay

## 2017-12-06 ENCOUNTER — Encounter (HOSPITAL_COMMUNITY): Payer: Self-pay | Admitting: *Deleted

## 2017-12-06 ENCOUNTER — Observation Stay (HOSPITAL_COMMUNITY): Payer: Medicaid Other

## 2017-12-06 DIAGNOSIS — Z87891 Personal history of nicotine dependence: Secondary | ICD-10-CM

## 2017-12-06 DIAGNOSIS — O328XX0 Maternal care for other malpresentation of fetus, not applicable or unspecified: Secondary | ICD-10-CM | POA: Diagnosis present

## 2017-12-06 DIAGNOSIS — R748 Abnormal levels of other serum enzymes: Secondary | ICD-10-CM

## 2017-12-06 DIAGNOSIS — R1013 Epigastric pain: Secondary | ICD-10-CM

## 2017-12-06 DIAGNOSIS — O26892 Other specified pregnancy related conditions, second trimester: Secondary | ICD-10-CM | POA: Diagnosis not present

## 2017-12-06 DIAGNOSIS — O219 Vomiting of pregnancy, unspecified: Secondary | ICD-10-CM

## 2017-12-06 DIAGNOSIS — Z95828 Presence of other vascular implants and grafts: Secondary | ICD-10-CM

## 2017-12-06 DIAGNOSIS — O1412 Severe pre-eclampsia, second trimester: Secondary | ICD-10-CM

## 2017-12-06 DIAGNOSIS — Z3A26 26 weeks gestation of pregnancy: Secondary | ICD-10-CM

## 2017-12-06 DIAGNOSIS — O1420 HELLP syndrome (HELLP), unspecified trimester: Secondary | ICD-10-CM | POA: Diagnosis present

## 2017-12-06 DIAGNOSIS — O1424 HELLP syndrome, complicating childbirth: Principal | ICD-10-CM | POA: Diagnosis present

## 2017-12-06 DIAGNOSIS — O162 Unspecified maternal hypertension, second trimester: Secondary | ICD-10-CM

## 2017-12-06 DIAGNOSIS — O10912 Unspecified pre-existing hypertension complicating pregnancy, second trimester: Secondary | ICD-10-CM

## 2017-12-06 DIAGNOSIS — O26899 Other specified pregnancy related conditions, unspecified trimester: Secondary | ICD-10-CM

## 2017-12-06 LAB — URINALYSIS, ROUTINE W REFLEX MICROSCOPIC
Bacteria, UA: NONE SEEN
Glucose, UA: NEGATIVE mg/dL
Ketones, ur: 5 mg/dL — AB
Leukocytes, UA: NEGATIVE
Nitrite: NEGATIVE
Protein, ur: >=300 mg/dL — AB
Specific Gravity, Urine: 1.044 — ABNORMAL HIGH (ref 1.005–1.030)
pH: 5 (ref 5.0–8.0)

## 2017-12-06 LAB — CBC WITH DIFFERENTIAL/PLATELET
Basophils Absolute: 0 10*3/uL (ref 0.0–0.1)
Basophils Relative: 0 %
Eosinophils Absolute: 0.1 10*3/uL (ref 0.0–0.5)
Eosinophils Relative: 1 %
HCT: 37.2 % (ref 36.0–46.0)
Hemoglobin: 13.3 g/dL (ref 12.0–15.0)
Lymphocytes Relative: 6 %
Lymphs Abs: 1 10*3/uL (ref 0.7–4.0)
MCH: 33.2 pg (ref 26.0–34.0)
MCHC: 35.8 g/dL (ref 30.0–36.0)
MCV: 92.8 fL (ref 80.0–100.0)
Monocytes Absolute: 0.8 10*3/uL (ref 0.1–1.0)
Monocytes Relative: 5 %
Neutro Abs: 14.1 10*3/uL (ref 1.7–7.7)
Neutrophils Relative %: 88 %
Platelets: 52 10*3/uL — ABNORMAL LOW (ref 150–400)
RBC: 4.01 MIL/uL (ref 3.87–5.11)
RDW: 12.5 % (ref 11.5–15.5)
WBC: 16 10*3/uL — ABNORMAL HIGH (ref 4.0–10.5)
nRBC: 0 % (ref 0.0–0.2)

## 2017-12-06 LAB — COMPREHENSIVE METABOLIC PANEL
ALT: 120 U/L — ABNORMAL HIGH (ref 0–44)
ALT: 315 U/L — ABNORMAL HIGH (ref 0–44)
AST: 166 U/L — ABNORMAL HIGH (ref 15–41)
AST: 431 U/L — ABNORMAL HIGH (ref 15–41)
Albumin: 3.9 g/dL (ref 3.5–5.0)
Albumin: 3.1 g/dL — ABNORMAL LOW (ref 3.5–5.0)
Alkaline Phosphatase: 68 U/L (ref 38–126)
Alkaline Phosphatase: 61 U/L (ref 38–126)
Anion gap: 11 (ref 5–15)
Anion gap: 10 (ref 5–15)
BUN: 14 mg/dL (ref 6–20)
BUN: 13 mg/dL (ref 6–20)
CO2: 21 mmol/L — ABNORMAL LOW (ref 22–32)
CO2: 21 mmol/L — ABNORMAL LOW (ref 22–32)
Calcium: 9.2 mg/dL (ref 8.9–10.3)
Calcium: 8.4 mg/dL — ABNORMAL LOW (ref 8.9–10.3)
Chloride: 104 mmol/L (ref 98–111)
Chloride: 102 mmol/L (ref 98–111)
Creatinine, Ser: 0.67 mg/dL (ref 0.44–1.00)
Creatinine, Ser: 0.52 mg/dL (ref 0.44–1.00)
GFR calc Af Amer: >60 mL/min (ref >60)
GFR calc Af Amer: >60 mL/min (ref >60)
GFR calc non Af Amer: >60 mL/min (ref >60)
GFR calc non Af Amer: >60 mL/min (ref >60)
Glucose, Bld: 101 mg/dL — ABNORMAL HIGH (ref 70–99)
Glucose, Bld: 149 mg/dL — ABNORMAL HIGH (ref 70–99)
Potassium: 3.6 mmol/L (ref 3.5–5.1)
Potassium: 3.2 mmol/L — ABNORMAL LOW (ref 3.5–5.1)
Sodium: 136 mmol/L (ref 135–145)
Sodium: 133 mmol/L — ABNORMAL LOW (ref 135–145)
Total Bilirubin: 0.7 mg/dL (ref 0.3–1.2)
Total Bilirubin: 1.1 mg/dL (ref 0.3–1.2)
Total Protein: 6.8 g/dL (ref 6.5–8.1)
Total Protein: 5.6 g/dL — ABNORMAL LOW (ref 6.5–8.1)

## 2017-12-06 LAB — AMYLASE: Amylase: 46 U/L (ref 28–100)

## 2017-12-06 LAB — TYPE AND SCREEN
ABO/RH(D): O POS
Antibody Screen: NEGATIVE

## 2017-12-06 LAB — LIPASE, BLOOD: Lipase: 25 U/L (ref 11–51)

## 2017-12-06 LAB — ABO/RH: ABO/RH(D): O POS

## 2017-12-06 LAB — PROTEIN / CREATININE RATIO, URINE
Creatinine, Urine: 534 mg/dL
Protein Creatinine Ratio: 1.91 mg/mg{Cre} — ABNORMAL HIGH (ref 0.00–0.15)
Total Protein, Urine: 1018 mg/dL

## 2017-12-06 LAB — CBC
HCT: 40.8 % (ref 36.0–46.0)
Hemoglobin: 14.3 g/dL (ref 12.0–15.0)
MCH: 32.8 pg (ref 26.0–34.0)
MCHC: 35 g/dL (ref 30.0–36.0)
MCV: 93.6 fL (ref 80.0–100.0)
Platelets: 175 10*3/uL (ref 150–400)
RBC: 4.36 MIL/uL (ref 3.87–5.11)
RDW: 12.4 % (ref 11.5–15.5)
WBC: 13.8 10*3/uL — ABNORMAL HIGH (ref 4.0–10.5)
nRBC: 0 % (ref 0.0–0.2)

## 2017-12-06 SURGERY — Surgical Case
Anesthesia: Regional

## 2017-12-06 MED ORDER — BETAMETHASONE SOD PHOS & ACET 6 (3-3) MG/ML IJ SUSP
12.0000 mg | Freq: Once | INTRAMUSCULAR | Status: AC
  Administered 2017-12-06: 12 mg via INTRAMUSCULAR
  Filled 2017-12-06: qty 2

## 2017-12-06 MED ORDER — MAGNESIUM SULFATE 40 G IN LACTATED RINGERS - SIMPLE
2.0000 g/h | INTRAVENOUS | Status: DC
  Administered 2017-12-07: 2 g/h via INTRAVENOUS
  Filled 2017-12-06 (×2): qty 500

## 2017-12-06 MED ORDER — MAGNESIUM SULFATE BOLUS VIA INFUSION
4.0000 g | Freq: Once | INTRAVENOUS | Status: AC
  Administered 2017-12-06: 4 g via INTRAVENOUS
  Filled 2017-12-06: qty 500

## 2017-12-06 MED ORDER — MAGNESIUM SULFATE 2 GM/50ML IV SOLN: 2.0000 g | Freq: Once | INTRAVENOUS | Status: DC

## 2017-12-06 MED ORDER — ONDANSETRON 8 MG PO TBDP
8.0000 mg | ORAL_TABLET | Freq: Once | ORAL | Status: AC
  Administered 2017-12-06: 8 mg via ORAL
  Filled 2017-12-06: qty 1

## 2017-12-06 MED ORDER — NIFEDIPINE 10 MG PO CAPS: 20.0000 mg | ORAL_CAPSULE | ORAL | Status: DC | PRN

## 2017-12-06 MED ORDER — NIFEDIPINE 10 MG PO CAPS: 10.0000 mg | ORAL_CAPSULE | Freq: Once | ORAL | Status: DC

## 2017-12-06 MED ORDER — MORPHINE SULFATE (PF) 0.5 MG/ML IJ SOLN
INTRAMUSCULAR | Status: AC
  Filled 2017-12-06: qty 10

## 2017-12-06 MED ORDER — LABETALOL HCL 5 MG/ML IV SOLN
20.0000 mg | INTRAVENOUS | Status: DC | PRN
  Administered 2017-12-06 (×2): 20 mg via INTRAVENOUS
  Filled 2017-12-06 (×2): qty 4

## 2017-12-06 MED ORDER — HYDROMORPHONE HCL 1 MG/ML IJ SOLN
1.0000 mg | INTRAMUSCULAR | Status: DC | PRN
  Administered 2017-12-06 (×2): 1 mg via INTRAVENOUS
  Filled 2017-12-06 (×2): qty 1

## 2017-12-06 MED ORDER — NIFEDIPINE 10 MG PO CAPS
10.0000 mg | ORAL_CAPSULE | ORAL | Status: DC | PRN
  Administered 2017-12-06 (×2): 10 mg via ORAL

## 2017-12-06 MED ORDER — HYDRALAZINE HCL 20 MG/ML IJ SOLN
10.0000 mg | INTRAMUSCULAR | Status: DC | PRN
  Administered 2017-12-06: 10 mg via INTRAVENOUS
  Filled 2017-12-06: qty 1

## 2017-12-06 MED ORDER — SOD CITRATE-CITRIC ACID 500-334 MG/5ML PO SOLN
ORAL | Status: AC
  Administered 2017-12-06: 30 mL
  Filled 2017-12-06: qty 15

## 2017-12-06 MED ORDER — FENTANYL CITRATE (PF) 100 MCG/2ML IJ SOLN
INTRAMUSCULAR | Status: AC
  Filled 2017-12-06: qty 2

## 2017-12-06 MED ORDER — LABETALOL HCL 5 MG/ML IV SOLN
40.0000 mg | INTRAVENOUS | Status: DC | PRN
  Administered 2017-12-06: 40 mg via INTRAVENOUS

## 2017-12-06 MED ORDER — ONDANSETRON HCL 4 MG/2ML IJ SOLN
INTRAMUSCULAR | Status: AC
  Filled 2017-12-06: qty 2

## 2017-12-06 MED ORDER — BUPIVACAINE HCL (PF) 0.5 % IJ SOLN
INTRAMUSCULAR | Status: AC
  Filled 2017-12-06: qty 30

## 2017-12-06 MED ORDER — PROMETHAZINE HCL 25 MG RE SUPP
12.5000 mg | Freq: Once | RECTAL | Status: DC
  Filled 2017-12-06: qty 1

## 2017-12-06 MED ORDER — ACETAMINOPHEN 325 MG PO TABS
650.0000 mg | ORAL_TABLET | ORAL | Status: DC | PRN
  Filled 2017-12-06: qty 2

## 2017-12-06 MED ORDER — PANTOPRAZOLE SODIUM 40 MG PO TBEC
40.0000 mg | DELAYED_RELEASE_TABLET | Freq: Once | ORAL | Status: AC
  Administered 2017-12-06: 40 mg via ORAL
  Filled 2017-12-06: qty 1

## 2017-12-06 MED ORDER — LABETALOL HCL 5 MG/ML IV SOLN
40.0000 mg | INTRAVENOUS | Status: DC | PRN
  Filled 2017-12-06: qty 8

## 2017-12-06 MED ORDER — SODIUM CHLORIDE 0.9% FLUSH: 10.0000 mL | INTRAVENOUS | Status: DC | PRN

## 2017-12-06 MED ORDER — NIFEDIPINE 10 MG PO CAPS
20.0000 mg | ORAL_CAPSULE | ORAL | Status: DC | PRN
  Filled 2017-12-06: qty 2

## 2017-12-06 MED ORDER — MIDAZOLAM HCL 2 MG/2ML IJ SOLN
INTRAMUSCULAR | Status: AC
  Filled 2017-12-06: qty 2

## 2017-12-06 MED ORDER — DEXAMETHASONE SODIUM PHOSPHATE 4 MG/ML IJ SOLN
INTRAMUSCULAR | Status: AC
  Filled 2017-12-06: qty 1

## 2017-12-06 MED ORDER — HYDRALAZINE HCL 20 MG/ML IJ SOLN
5.0000 mg | INTRAMUSCULAR | Status: DC | PRN
  Administered 2017-12-06: 5 mg via INTRAVENOUS
  Filled 2017-12-06: qty 1

## 2017-12-06 MED ORDER — OXYTOCIN 10 UNIT/ML IJ SOLN
INTRAMUSCULAR | Status: AC
  Filled 2017-12-06: qty 4

## 2017-12-06 MED ORDER — SCOPOLAMINE 1 MG/3DAYS TD PT72
MEDICATED_PATCH | TRANSDERMAL | Status: AC
  Filled 2017-12-06: qty 1

## 2017-12-06 MED ORDER — SODIUM CHLORIDE 0.9% FLUSH: 10.0000 mL | Freq: Two times a day (BID) | INTRAVENOUS | Status: DC

## 2017-12-06 MED ORDER — LACTATED RINGERS IV SOLN
INTRAVENOUS | Status: DC
  Administered 2017-12-07 – 2017-12-08 (×2): via INTRAVENOUS

## 2017-12-06 SURGICAL SUPPLY — 36 items
BENZOIN TINCTURE PRP APPL 2/3 (GAUZE/BANDAGES/DRESSINGS) ×2 IMPLANT
CHLORAPREP W/TINT 26ML (MISCELLANEOUS) ×2 IMPLANT
CLAMP CORD UMBIL (MISCELLANEOUS) IMPLANT
CLOTH BEACON ORANGE TIMEOUT ST (SAFETY) ×2 IMPLANT
DRSG OPSITE POSTOP 4X10 (GAUZE/BANDAGES/DRESSINGS) ×2 IMPLANT
ELECT REM PT RETURN 9FT ADLT (ELECTROSURGICAL) ×2
ELECTRODE REM PT RTRN 9FT ADLT (ELECTROSURGICAL) ×1 IMPLANT
EXTRACTOR VACUUM BELL STYLE (SUCTIONS) IMPLANT
GAUZE SPONGE 4X4 12PLY STRL LF (GAUZE/BANDAGES/DRESSINGS) ×4 IMPLANT
GLOVE BIOGEL PI IND STRL 6.5 (GLOVE) ×1 IMPLANT
GLOVE BIOGEL PI IND STRL 7.0 (GLOVE) ×2 IMPLANT
GLOVE BIOGEL PI INDICATOR 6.5 (GLOVE) ×1
GLOVE BIOGEL PI INDICATOR 7.0 (GLOVE) ×2
GLOVE ORTHOPEDIC STR SZ6.5 (GLOVE) ×2 IMPLANT
GOWN STRL REUS W/TWL LRG LVL3 (GOWN DISPOSABLE) ×6 IMPLANT
HEMOSTAT ARISTA ABSORB 3G PWDR (MISCELLANEOUS) ×2 IMPLANT
KIT ABG SYR 3ML LUER SLIP (SYRINGE) IMPLANT
NEEDLE HYPO 22GX1.5 SAFETY (NEEDLE) ×2 IMPLANT
NEEDLE HYPO 25X1 1.5 SAFETY (NEEDLE) IMPLANT
NS IRRIG 1000ML POUR BTL (IV SOLUTION) ×2 IMPLANT
PACK C SECTION WH (CUSTOM PROCEDURE TRAY) ×2 IMPLANT
PAD ABD 8X7 1/2 STERILE (GAUZE/BANDAGES/DRESSINGS) ×4 IMPLANT
PAD OB MATERNITY 4.3X12.25 (PERSONAL CARE ITEMS) ×2 IMPLANT
PENCIL SMOKE EVAC W/HOLSTER (ELECTROSURGICAL) ×2 IMPLANT
SPONGE LAP 18X18 RF (DISPOSABLE) ×6 IMPLANT
STRIP CLOSURE SKIN 1/2X4 (GAUZE/BANDAGES/DRESSINGS) ×2 IMPLANT
SUT MON AB 4-0 PS1 27 (SUTURE) ×2 IMPLANT
SUT PLAIN 2 0 (SUTURE) ×1
SUT PLAIN ABS 2-0 CT1 27XMFL (SUTURE) ×1 IMPLANT
SUT VIC AB 0 CT1 36 (SUTURE) ×4 IMPLANT
SUT VIC AB 0 CTX 36 (SUTURE) ×1
SUT VIC AB 0 CTX36XBRD ANBCTRL (SUTURE) ×1 IMPLANT
SYR CONTROL 10ML LL (SYRINGE) ×2 IMPLANT
TAPE MEDIFIX FOAM 3 (GAUZE/BANDAGES/DRESSINGS) ×2 IMPLANT
TOWEL OR 17X24 6PK STRL BLUE (TOWEL DISPOSABLE) ×2 IMPLANT
TRAY FOLEY W/BAG SLVR 14FR LF (SET/KITS/TRAYS/PACK) ×2 IMPLANT

## 2017-12-06 NOTE — Progress Notes (Signed)
Patient ID: Krystal [name], female   DOB: [birth year], 27 y.o.   MRN: [id number] Informed Dr. Rosana Hoes of elevated liver enzymes, elevated protein creatinine ratio.  Diagnosis of preeclampsia with severe features versus HELLP variant.  Betamethasone and magnesium sulfate, growth ultrasound ordered. IV team at [facility] now. PICC line not in place yet.  IV team at bedside  Per MFM: Repeat labs in 4 hours. If Vtx, start IOL now. If Breech and labs are stable in 4 hours plan for C/S in 48 hours.  Dr. Rosana Hoes assuming care of patient.  [name], [place], [place] 12/06/2017 7:28 PM

## 2017-12-06 NOTE — Progress Notes (Signed)
Patient with significantly worsening liver enzymes on most recent blood work. Given this significant deterioration, will proceed with primary c-section. Consent previously signed, answered all questions, patient and husband agreeable.    Feliz Beam, M.D. [facility]

## 2017-12-06 NOTE — Progress Notes (Signed)
Patient is 27 yo G1P0 @ [redacted]w[redacted]d who presented for severe epigastric pain, nausea/vomiting. H/o cHTN with severe range BP on arrival. Gets care in [place].   BP improved with PO anti-hypertensives. Lab work showed superimposed pre-eclampsia with severe features based on elevated AST/ALT and urine protein creatinine ratio. Platelets currently normal although somewhat decreased from earlier in pregnancy. Reviewed case with Dr. Gertie Exon of MFM who recommends betamethasone, magnesium and planning to move towards delivery.  We have no records for this patient and will have comprehensive US done for EFW and position. If patient is cephalic, will bring to L&D and start induction. If patient is breech, will plan for c-section in 48 hrs. Will repeat lab work in 4 hours to assess for stability. PICC line team currently attempting to place line, will start [place] and MgSO4 once they are done.    Feliz Beam, M.D. [facility]

## 2017-12-06 NOTE — MAU Provider Note (Signed)
Chief Complaint  Patient presents with  . Abdominal Pain  . Emesis  . Nausea     First Provider Initiated Contact with Patient 12/06/17 1658      S: Krystal [name]  is a 27 y.o. y.o. year old G1P0 female at [redacted]w[redacted]d weeks gestation by LMP and 19-week ultrasound who presents to MAU with severe epigastric pain radiating to mid back and shoulder and severely elevated blood pressures. Hx CHTN. Current blood pressure medication: Aldomet 500 TID and Labetalol 100 BID. Took one dose of each today. Also having exacerbation of N/V which she has had throughout pregnancy, but it worsened yesterday. No Hx Gall stones.   Reports 1 episode of epigastric pain several days ago that resolved with Tums.  Current episode of epigastric pain began at 1 AM.  Took omeprazole and Tums today with no improvement.   Associated symptoms: Neg fever, chills, headache or vision changes. Pos epigastric pain, nausea, vomiting. Contractions: Denies Vaginal bleeding: Denies Fetal movement: Nml  Gets prenatal care at [place].  Some records available in care everywhere.  Anatomy ultrasound at 19 weeks.  No detailed report visible.   O:  Patient Vitals for the past 24 hrs:  BP Temp Temp src Pulse Resp SpO2 Height Weight  12/06/17 1730 (!) 160/99 - - (!) 101 - - - -  12/06/17 1724 (!) 183/114 - - 77 - - - -  12/06/17 1700 (!) 206/117 - - 66 - - - -  12/06/17 1641 (!) 215/122 98.1 F (36.7 C) Oral 60 20 - - 107.5 kg   General: NAD Heart: Regular rate Lungs: Normal rate and effort Abd: Soft, NT, Gravid, S=D Extremities: 3+ Pedal edema. Left lower extremity absent.  Neuro: 2+ deep tendon reflexes, No clonus Pelvic: NEFG, no bleeding or LOF.      EFM: 145, Moderate variability, no accelerations, no decelerations.  Intermittent tracing due to patient's pain and staff members attempting to get IV access. Toco: None traced.  Results for orders placed or performed during the hospital  encounter of 12/06/17 (from the past 24 hour(s))  Urinalysis, Routine w reflex microscopic     Status: Abnormal   Collection Time: 12/06/17  4:50 PM  Result Value Ref Range   Color, Urine AMBER (A) YELLOW   APPearance CLOUDY (A) CLEAR   Specific Gravity, Urine 1.044 (H) 1.005 - 1.030   pH 5.0 5.0 - 8.0   Glucose, UA NEGATIVE NEGATIVE mg/dL   Hgb urine dipstick SMALL (A) NEGATIVE   Bilirubin Urine SMALL (A) NEGATIVE   Ketones, ur 5 (A) NEGATIVE mg/dL   Protein, ur >=300 (A) NEGATIVE mg/dL   Nitrite NEGATIVE NEGATIVE   Leukocytes, UA NEGATIVE NEGATIVE   RBC / HPF 6-10 0 - 5 RBC/hpf   WBC, UA 11-20 0 - 5 WBC/hpf   Bacteria, UA NONE SEEN NONE SEEN   Squamous Epithelial / LPF 11-20 0 - 5   Mucus PRESENT   Protein / creatinine ratio, urine     Status: Abnormal   Collection Time: 12/06/17  4:50 PM  Result Value Ref Range   Creatinine, Urine 534.00 mg/dL   Total Protein, Urine 1,018 mg/dL   Protein Creatinine Ratio 1.91 (H) 0.00 - 0.15 mg/mg[Cre]  CBC     Status: Abnormal   Collection Time: 12/06/17  5:49 PM  Result Value Ref Range   WBC 13.8 (H) 4.0 - 10.5 K/uL   RBC 4.36 3.87 - 5.11 MIL/uL   Hemoglobin 14.3 12.0 -  15.0 g/dL   HCT 40.8 36.0 - 46.0 %   MCV 93.6 80.0 - 100.0 fL   MCH 32.8 26.0 - 34.0 pg   MCHC 35.0 30.0 - 36.0 g/dL   RDW 12.4 11.5 - 15.5 %   Platelets 175 150 - 400 K/uL   nRBC 0.0 0.0 - 0.2 %  Comprehensive metabolic panel     Status: Abnormal   Collection Time: 12/06/17  5:49 PM  Result Value Ref Range   Sodium 136 135 - 145 mmol/L   Potassium 3.6 3.5 - 5.1 mmol/L   Chloride 104 98 - 111 mmol/L   CO2 21 (L) 22 - 32 mmol/L   Glucose, Bld 101 (H) 70 - 99 mg/dL   BUN 14 6 - 20 mg/dL   Creatinine, Ser 0.67 0.44 - 1.00 mg/dL   Calcium 9.2 8.9 - 10.3 mg/dL   Total Protein 6.8 6.5 - 8.1 g/dL   Albumin 3.9 3.5 - 5.0 g/dL   AST 166 (H) 15 - 41 U/L   ALT 120 (H) 0 - 44 U/L   Alkaline Phosphatase 68 38 - 126 U/L   Total Bilirubin 0.7 0.3 - 1.2 mg/dL   GFR calc  non Af Amer >60 >60 mL/min   GFR calc Af Amer >60 >60 mL/min   Anion gap 11 5 - 15  Lipase, blood     Status: None   Collection Time: 12/06/17  5:49 PM  Result Value Ref Range   Lipase 25 11 - 51 U/L  Amylase     Status: None   Collection Time: 12/06/17  5:49 PM  Result Value Ref Range   Amylase 46 28 - 100 U/L  Type and screen [place]     Status: None   Collection Time: 12/06/17  5:50 PM  Result Value Ref Range   ABO/RH(D) O POS    Antibody Screen NEG    Sample Expiration      12/09/2017 Performed at [facility], [address], [place], [place] [address]   ABO/Rh     Status: None   Collection Time: 12/06/17  5:50 PM  Result Value Ref Range   ABO/RH(D)      O POS Performed at [facility], [address], [place], [place] [address]      1703: Unable to quickly get IV access. Will change to Procardia 20 mg. CRNA called to try IV start--at BS.     A: [redacted]w[redacted]d week IUP 1. Severe hypertension affecting pregnancy in second trimester   2. Nausea/vomiting in pregnancy   3. Epigastric pain during pregnancy, antepartum   4. Elevated liver enzymes   5. [redacted] weeks gestation of pregnancy   6. Chronic hypertension in obstetric context, second trimester   7. Severe preeclampsia, second trimester      P: Admit to [facility] specialty care unit per consult with Sloan Leiter, MD. PICC line Magnesium sulfate Betamethasone Growth ultrasound MFM consult Right upper quadrant ultrasound Repeat preeclampsia labs in 4 hours Dilaudid as needed for pain Zofran as needed for nausea  [name] [place], [place] 12/06/2017 4:58 PM

## 2017-12-06 NOTE — Progress Notes (Signed)
Writer spoke with Firefighter.  We reviewd xray report.  Radiologist wrote picc tip in lower svc.  Writer told RN picc ready for use.

## 2017-12-06 NOTE — H&P (Signed)
Chief Complaint  Patient presents with  . Abdominal Pain  . Emesis  . Nausea     First Provider Initiated Contact with Patient 12/06/17 1658      S: Krystal [name]  is a 27 y.o. y.o. year old G1P0 female at [redacted]w[redacted]d weeks gestation by LMP and 19-week ultrasound who presents to MAU with severe epigastric pain radiating to mid back and shoulder and severely elevated blood pressures. Hx CHTN. Current blood pressure medication: Aldomet 500 TID and Labetalol 100 BID. Took one dose of each today. Also having exacerbation of N/V which she has had throughout pregnancy, but it worsened yesterday. No Hx Gall stones.   Reports 1 episode of epigastric pain several days ago that resolved with Tums.  Current episode of epigastric pain began at 1 AM.  Took omeprazole and Tums today with no improvement.   Associated symptoms: Neg fever, chills, headache or vision changes. Pos epigastric pain, nausea, vomiting. Contractions: Denies Vaginal bleeding: Denies Fetal movement: Nml  Gets prenatal care at [name].  Some records available in care everywhere.  Anatomy ultrasound at 19 weeks.  No detailed report visible.   O:  Patient Vitals for Krystal past 24 hrs:  BP Temp Temp src Pulse Resp SpO2 Height Weight  12/06/17 1730 (!) 160/99 - - (!) 101 - - - -  12/06/17 1724 (!) 183/114 - - 77 - - - -  12/06/17 1700 (!) 206/117 - - 66 - - - -  12/06/17 1641 (!) 215/122 98.1 F (36.7 C) Oral 60 20 - - 107.5 kg   General: NAD Heart: Regular rate Lungs: Normal rate and effort Abd: Soft, NT, Gravid, S=D Extremities: 3+ Pedal edema. Left lower extremity absent.  Neuro: 2+ deep tendon reflexes, No clonus Pelvic: NEFG, no bleeding or LOF.      EFM: 145, Moderate variability, no accelerations, no decelerations.  Intermittent tracing due to patient's pain and staff members attempting to get IV access. Toco: None traced.  Results for orders placed or performed during [name]  encounter of 12/06/17 (from Krystal past 24 hour(s))  Urinalysis, Routine w reflex microscopic     Status: Abnormal   Collection Time: 12/06/17  4:50 PM  Result Value Ref Range   Color, Urine AMBER (A) YELLOW   APPearance CLOUDY (A) CLEAR   Specific Gravity, Urine 1.044 (H) 1.005 - 1.030   pH 5.0 5.0 - 8.0   Glucose, UA NEGATIVE NEGATIVE mg/dL   Hgb urine dipstick SMALL (A) NEGATIVE   Bilirubin Urine SMALL (A) NEGATIVE   Ketones, ur 5 (A) NEGATIVE mg/dL   Protein, ur >=300 (A) NEGATIVE mg/dL   Nitrite NEGATIVE NEGATIVE   Leukocytes, UA NEGATIVE NEGATIVE   RBC / HPF 6-10 0 - 5 RBC/hpf   WBC, UA 11-20 0 - 5 WBC/hpf   Bacteria, UA NONE SEEN NONE SEEN   Squamous Epithelial / LPF 11-20 0 - 5   Mucus PRESENT   Protein / creatinine ratio, urine     Status: Abnormal   Collection Time: 12/06/17  4:50 PM  Result Value Ref Range   Creatinine, Urine 534.00 mg/dL   Total Protein, Urine 1,018 mg/dL   Protein Creatinine Ratio 1.91 (H) 0.00 - 0.15 mg/mg[Cre]  CBC     Status: Abnormal   Collection Time: 12/06/17  5:49 PM  Result Value Ref Range   WBC 13.8 (H) 4.0 - 10.5 K/uL   RBC 4.36 3.87 - 5.11 MIL/uL   Hemoglobin 14.3 12.0 -  15.0 g/dL   HCT 40.8 36.0 - 46.0 %   MCV 93.6 80.0 - 100.0 fL   MCH 32.8 26.0 - 34.0 pg   MCHC 35.0 30.0 - 36.0 g/dL   RDW 12.4 11.5 - 15.5 %   Platelets 175 150 - 400 K/uL   nRBC 0.0 0.0 - 0.2 %  Comprehensive metabolic panel     Status: Abnormal   Collection Time: 12/06/17  5:49 PM  Result Value Ref Range   Sodium 136 135 - 145 mmol/L   Potassium 3.6 3.5 - 5.1 mmol/L   Chloride 104 98 - 111 mmol/L   CO2 21 (L) 22 - 32 mmol/L   Glucose, Bld 101 (H) 70 - 99 mg/dL   BUN 14 6 - 20 mg/dL   Creatinine, Ser 0.67 0.44 - 1.00 mg/dL   Calcium 9.2 8.9 - 10.3 mg/dL   Total Protein 6.8 6.5 - 8.1 g/dL   Albumin 3.9 3.5 - 5.0 g/dL   AST 166 (H) 15 - 41 U/L   ALT 120 (H) 0 - 44 U/L   Alkaline Phosphatase 68 38 - 126 U/L   Total Bilirubin 0.7 0.3 - 1.2 mg/dL   GFR calc  non Af Amer >60 >60 mL/min   GFR calc Af Amer >60 >60 mL/min   Anion gap 11 5 - 15  Lipase, blood     Status: None   Collection Time: 12/06/17  5:49 PM  Result Value Ref Range   Lipase 25 11 - 51 U/L  Amylase     Status: None   Collection Time: 12/06/17  5:49 PM  Result Value Ref Range   Amylase 46 28 - 100 U/L  Type and screen [name]     Status: None   Collection Time: 12/06/17  5:50 PM  Result Value Ref Range   ABO/RH(D) O POS    Antibody Screen NEG    Sample Expiration      12/09/2017 Performed at [name], [address], [place], [place] [address]   ABO/Rh     Status: None   Collection Time: 12/06/17  5:50 PM  Result Value Ref Range   ABO/RH(D)      O POS Performed at [name], [address], [name], [place] [address]      1703: Unable to quickly get IV access. Will change to Procardia 20 mg. CRNA called to try IV start--at BS.     A: [redacted]w[redacted]d week IUP 1. Severe hypertension affecting pregnancy in second trimester   2. Nausea/vomiting in pregnancy   3. Epigastric pain during pregnancy, antepartum   4. Elevated liver enzymes   5. [redacted] weeks gestation of pregnancy   6. Chronic hypertension in obstetric context, second trimester   7. Severe preeclampsia, second trimester      P: Admit to [name] care unit per consult with Krystal Leiter, MD. PICC line Magnesium sulfate Betamethasone Growth ultrasound MFM consult Right upper quadrant ultrasound Repeat preeclampsia labs in 4 hours Dilaudid as needed for pain Zofran as needed for nausea  Krystal [name] [place], [place] 12/06/2017 4:58 PM

## 2017-12-06 NOTE — MAU Note (Signed)
Upper middle abdominal pain since 0100, constant, stabbing  N/V since 0230  Has felt a couple ctx today

## 2017-12-06 NOTE — Progress Notes (Signed)
Peripherally Inserted Central Catheter/Midline Placement  The IV Nurse has discussed with the patient and/or persons authorized to consent for the patient, the purpose of this procedure and the potential benefits and risks involved with this procedure.  The benefits include less needle sticks, lab draws from the catheter, and the patient may be discharged home with the catheter. Risks include, but not limited to, infection, bleeding, blood clot (thrombus formation), and puncture of an artery; nerve damage and irregular heartbeat and possibility to perform a PICC exchange if needed/ordered by physician.  Alternatives to this procedure were also discussed.  Bard Power PICC patient education guide, fact sheet on infection prevention and patient information card has been provided to patient /or left at bedside.    PICC/Midline Placement Documentation  PICC Double Lumen 12/06/17 PICC Right Brachial 39 cm 0 cm (Active)  Indication for Insertion or Continuance of Line Poor Vasculature-patient has had multiple peripheral attempts or PIVs lasting less than 24 hours;Limited venous access - need for IV therapy >5 days (PICC only);Administration of hyperosmolar/irritating solutions (i.e. TPN, Vancomycin, etc.) 12/06/2017  8:02 PM  Exposed Catheter (cm) 0 cm 12/06/2017  8:02 PM  Site Assessment Clean;Dry;Intact 12/06/2017  8:02 PM  Lumen #1 Status Flushed;Saline locked;Blood return noted 12/06/2017  8:02 PM  Lumen #2 Status Flushed;Saline locked;Blood return noted 12/06/2017  8:02 PM  Dressing Type Transparent 12/06/2017  8:02 PM  Dressing Status Clean;Dry;Intact;Antimicrobial disc in place 12/06/2017  8:02 PM  Line Care Connections checked and tightened 12/06/2017  8:02 PM  Line Adjustment (NICU/IV Team Only) No 12/06/2017  8:02 PM  Dressing Intervention New dressing 12/06/2017  8:02 PM  Dressing Change Due 12/13/17 12/06/2017  8:02 PM       Rolena Infante 12/06/2017, 8:03 PM

## 2017-12-06 NOTE — Progress Notes (Signed)
Faculty Note  Patient having intermittent severe epigastric pain, for which dilaudid has helped minimally.   Patient now has PICC line, MgSO4 bolus has been started. She has had severe range BP, has received 5/10 mg IV hydralazine, 20 mg IV labetalol, still has severe range BP, will give 40 mg IV labetalol now.  On [place], there is absent and intermittent reversed end diastolic flow. EFW 753 gms and baby is frank breech.  FHT: 120-130s, minimal to moderate variability, appropriate for GA, no accels, no decels  Reviewed course with MFM, who recommend proceeding with delivery if next set of lab work shows significant progression of disease, or if we are unable to control her blood pressure.   I reviewed the above with the patient and her husband, reviewed pre-eclampsia and expected course and recommendation for delivery given her chronic HTN with superimposed pre-eclampsia with severe features based on LFTs, proteinuria and severe range BP.  Reviewed concern for maternal well being and risks of pre-term delivery. Reviewed need for c-section given infant position of frank breech. Reviewed risks of c-section including infection, hemorrhage, damage to surrounding tissue and organs, possible need for additional procedures, possible need for c-section with subsequent pregnancies, risks for placental issues with subsequent pregnancies. She verbalizes understanding and is agreeable to a blood transfusion in the event of an emergency. Consent signed.   BTMZ x1 @ 2045 MgSO4 bolus going now PICC line in place Repeat lab work once MgSO4 bolus done Cont EFM/toco NICU aware and s/p consult   K. Arvilla Meres, M.D. [facility]

## 2017-12-06 NOTE — Progress Notes (Signed)
Contacted Theresa from the IV team, will meet patient in room 313 to insert the PICC line per anesthesia MD.  Ultrasound made aware of the patient location for bedside ultrasound.

## 2017-12-06 NOTE — H&P (Signed)
Obstetric History and Physical  Krystal [name] is a 27 y.o. G1P0 with IUP at [redacted]w[redacted]d presenting for severe epigastric pain. Increased nausea/vomiting today, reports she vomited her morning meds but was able to keep them down around noon (she retook them). Patient states she has been having  none contractions, none vaginal bleeding, intact membranes, with active fetal movement.    Prenatal Course Source of Care: [place], reports adequate care Pregnancy complications or risks: Patient Active Problem List   Diagnosis Date Noted  . Severe hypertension affecting pregnancy in second trimester 12/06/2017  . Chronic hypertension during pregnancy, antepartum 10/08/2017   Prenatal labs and studies: ABO, Rh: --/--/O POS, O POS Performed at [facility], [address] [place], [place] [address]  [phone]) Antibody: NEG (10/13 1750) Rubella:   RPR: Non Reactive (11/23 1800)  HBsAg:    HIV: Non Reactive (11/23 1800)  GBS:  2 hr GTT:  Not done Genetic screening not done Anatomy US normal per patient  Medical History:  Past Medical History:  Diagnosis Date  . Anxiety   . Asthma   . Hypertension   . IC (interstitial cystitis)   . Kidney stones   . Migraines   . Pregnant   . UTI (urinary tract infection)     Past Surgical History:  Procedure Laterality Date  . APPENDECTOMY    . 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 HAND RECONSTRUCTION Right   . INCISION AND DRAINAGE    . NASAL SEPTUM SURGERY      OB History  Gravida Para Term Preterm AB Living  1            SAB TAB Ectopic Multiple Live Births               # Outcome Date GA Lbr Len/2nd Weight Sex Delivery Anes PTL Lv  1 Current             Social History   Socioeconomic History  . Marital status: Married    Spouse name: Not on file  . Number of children: Not on file  . Years of education: Not on file  . Highest education level: Not on file  Occupational History  . Not on file    Social Needs  . Financial resource strain: Not on file  . Food insecurity:    Worry: Not on file    Inability: Not on file  . Transportation needs:    Medical: Not on file    Non-medical: Not on file  Tobacco Use  . Smoking status: Former Research scientist (life sciences)  . Smokeless tobacco: Never Used  Substance and Sexual Activity  . Alcohol use: Not Currently    Comment: bottle of wine a week  . Drug use: No  . Sexual activity: Not on file  Lifestyle  . Physical activity:    Days per week: Not on file    Minutes per session: Not on file  . Stress: Not on file  Relationships  . Social connections:    Talks on phone: Not on file    Gets together: Not on file    Attends religious service: Not on file    Active member of club or organization: Not on file    Attends meetings of clubs or organizations: Not on file    Relationship status: Not on file  Other Topics Concern  . Not on file  Social History Narrative  . Not on file  Family History  Problem Relation Age of Onset  . Hypertension Mother   . Hypertension Father     Medications Prior to Admission  Medication Sig Dispense Refill Last Dose  . albuterol (PROVENTIL HFA;VENTOLIN HFA) 108 (90 Base) MCG/ACT inhaler Inhale 1 puff into the lungs every 6 (six) hours as needed for wheezing or shortness of breath.   Past Month at Unknown time  . cyclobenzaprine (FLEXERIL) 10 MG tablet Take 1 tablet (10 mg total) by mouth every 8 (eight) hours as needed for muscle spasms. 30 tablet 1   . diphenhydrAMINE (BENADRYL) 25 mg capsule Take 1 capsule (25 mg total) by mouth every 6 (six) hours as needed. 30 capsule 0   . doxylamine, Sleep, (UNISOM) 25 MG tablet Take 1 tablet (25 mg total) by mouth at bedtime as needed (Nausea). 30 tablet 0 07/23/2017 at Unknown time  . famotidine (PEPCID) 20 MG tablet Take 1 tablet (20 mg total) by mouth 2 (two) times daily. 60 tablet 10   . labetalol (NORMODYNE) 100 MG tablet Take 4 tablets (400 mg total) by mouth 2 (two)  times daily. 30 tablet 3   . metoCLOPramide (REGLAN) 10 MG tablet Take 1 tablet (10 mg total) by mouth every 8 (eight) hours as needed for nausea. 1 tablet 0   . potassium chloride SA (K-DUR,KLOR-CON) 20 MEQ tablet Take 1 tablet (20 mEq total) by mouth 2 (two) times daily. 8 tablet 0   . Prenatal Vit-Fe Fumarate-FA (PRENATAL PO) Take 1 tablet by mouth daily.   07/23/2017 at Unknown time  . promethazine (PHENERGAN) 25 MG suppository Place 1 suppository (25 mg total) rectally every 6 (six) hours as needed for nausea or vomiting. 12 each 0   . promethazine (PHENERGAN) 25 MG tablet Take 1 tablet (25 mg total) by mouth every 6 (six) hours as needed for nausea or vomiting. 30 tablet 0   . scopolamine (TRANSDERM-SCOP) 1 MG/3DAYS Place 1 patch (1.5 mg total) onto the skin every 3 (three) days. 10 patch 12   . vitamin B-6 (VITAMIN B-6) 50 MG tablet Take 1 tablet (50 mg total) by mouth 3 (three) times daily as needed. (Patient taking differently: Take 50 mg by mouth 3 (three) times daily as needed (nausea). ) 30 tablet 0 07/23/2017 at Unknown time    Allergies  Allergen Reactions  . Red Dye Other (See Comments)    ALL CAUSE PROBLEMS #40 IS WORST-MIGRAINES   . Compazine [Prochlorperazine Maleate] Other (See Comments)    TWITCHING, CANT STAY STILL. Pt states she can tolerate promethazine  . Other Other (See Comments)    FANSIDAR FOR MALARIA-ASTHMA  . Vicodin [Hydrocodone-Acetaminophen] Itching and Nausea And Vomiting    Pt states she can tolerate acetaminophen  . Wheat Bran Other (See Comments)    GLUTEN SENSITIVE  . Reglan [Metoclopramide] Other (See Comments)    restless    Review of Systems: Negative except for what is mentioned in HPI.  Physical Exam: BP (!) 173/111   Pulse 78   Temp 97.9 F (36.6 C) (Oral)   Resp 18   Ht 5\' 11"  (1.803 m)   Wt 107.5 kg   LMP 06/03/2017 (Exact Date)   SpO2 100%   BMI 33.05 kg/m  CONSTITUTIONAL: Well-developed, well-nourished female in moderate distress    HENT:  Normocephalic, atraumatic, External right and left ear normal. Oropharynx is clear and moist EYES: Conjunctivae and EOM are normal. Pupils are equal, round, and reactive to light. No scleral icterus.  NECK: Normal  range of motion, supple, no masses SKIN: Skin is warm and dry. No rash noted. Not diaphoretic. No erythema. No pallor. NEUROLOGIC: Alert and oriented to person, place, and time. Normal reflexes, muscle tone coordination. No cranial nerve deficit noted. PSYCHIATRIC: Normal mood and affect. Normal behavior. Normal judgment and thought content. CARDIOVASCULAR: Normal heart rate noted, regular rhythm RESPIRATORY: Effort and breath sounds normal, no problems with respiration noted ABDOMEN: Soft, mildly tender to palpation, gravid. MUSCULOSKELETAL: Normal range of motion. No edema and no tenderness. 2+ distal pulses.  Cervical Exam:  Presentation: breech frank FHT:  Baseline rate 130s bpm   Variability minimal to moderate  Accelerations absent   Decelerations none Contractions: none   Pertinent Labs/Studies:   CBC Latest Ref Rng & Units 12/06/2017 11/24/2017 10/25/2017  WBC 4.0 - 10.5 K/uL 13.8(H) 11.9(H) 9.4  Hemoglobin 12.0 - 15.0 g/dL 14.3 14.3 13.0  Hematocrit 36.0 - 46.0 % 40.8 39.7 36.5  Platelets 150 - 400 K/uL 175 191 245   CMP Latest Ref Rng & Units 12/06/2017 11/24/2017 10/25/2017  Glucose 70 - 99 mg/dL 101(H) 83 96  BUN 6 - 20 mg/dL 14 9 10   Creatinine 0.44 - 1.00 mg/dL 0.67 0.60 0.83  Sodium 135 - 145 mmol/L 136 137 136  Potassium 3.5 - 5.1 mmol/L 3.6 3.8 3.2(L)  Chloride 98 - 111 mmol/L 104 105 106  CO2 22 - 32 mmol/L 21(L) 23 21(L)  Calcium 8.9 - 10.3 mg/dL 9.2 9.6 9.3  Total Protein 6.5 - 8.1 g/dL 6.8 6.2(L) 6.0(L)  Total Bilirubin 0.3 - 1.2 mg/dL 0.7 0.7 0.6  Alkaline Phos 38 - 126 U/L 68 52 47  AST 15 - 41 U/L 166(H) 39 18  ALT 0 - 44 U/L 120(H) 35 16    Assessment : Krystal [name] is a 27 y.o. G1P0 at [redacted]w[redacted]d being admitted for chronic HTN with  superimposed pre-eclampsia with severe features based on elevated LFT, proteinuria and severe range BP. Started on MgSO4 and betamethasone. Has received multiple pushes of IV anti-hypertensives. Growth US showed fetus in frank breech position with EFW 753 gms and absent and intermittent reversed end diastolic flow.   I have reviewed the above the patient and her husband and recommendation/plan for delivery given her severe pre-eclampsia. Labs pending now, will see if she has worsening of status and if so, will move towards delivery sooner than the 48 hrs needed to give betamethasone a full course. Reviewed that with breech position, this would mean a c-section, see prior progress note for full details; she has been counseled regarding risks of c-section and consent signed.  She has been seen by NICU provider.   She did have some confusion tonight about her dog, which they had re-homed recently. Husband states she is not quite herself although when asked for further detail, patient able to answer appropriately. Will watch closely and move towards delivery with any further worsening neuro status.   Plan:  FWB  - Cat II fetal heart tracing, appropriate for GA   - GBS unknown - s/p BTMZ #1 @ 2045  HTN/PEC - IV hydralazine/labetalol per protocol for BP management - MgSO4 2 g/hr - will plan for delivery with worsening s/s    K. Arvilla Meres, M.D. [facility]  12/06/2017, 10:57 PM

## 2017-12-06 NOTE — Anesthesia Preprocedure Evaluation (Addendum)
Anesthesia Evaluation  Patient identified by MRN, date of birth, ID band Patient awake    History of Anesthesia Complications Negative for: history of anesthetic complications  Airway Mallampati: III  TM Distance: >3 FB Neck ROM: Full    Dental no notable dental hx.    Pulmonary asthma (mild, rare inhaler use, no recent hospitalizations) , former smoker,    Pulmonary exam normal        Cardiovascular hypertension (severe pre-eclampsia with HELLP), Normal cardiovascular exam     Neuro/Psych  Headaches, PSYCHIATRIC DISORDERS Anxiety    GI/Hepatic negative GI ROS, Neg liver ROS,   Endo/Other  negative endocrine ROS  Renal/GU negative Renal ROS  negative genitourinary   Musculoskeletal negative musculoskeletal ROS (+)   Abdominal   Peds  Hematology negative hematology ROS (+)   Anesthesia Other Findings S/p LUE amputation for necrotizing infection  Reproductive/Obstetrics (+) Pregnancy 26 week, HELLP syndrome                            Anesthesia Physical Anesthesia Plan  ASA: III and emergent  Anesthesia Plan: General   Post-op Pain Management:    Induction: Intravenous, Rapid sequence and Cricoid pressure planned  PONV Risk Score and Plan: 4 or greater and Ondansetron, Dexamethasone, Treatment may vary due to age or medical condition and Promethazine  Airway Management Planned: Oral ETT  Additional Equipment: None  Intra-op Plan:   Post-operative Plan: Extubation in OR  Informed Consent:   Plan Discussed with:   Anesthesia Plan Comments: (GA for platelet count 52)       Anesthesia Quick Evaluation

## 2017-12-06 NOTE — Consult Note (Signed)
Neonatology  Time - 10:15pm  Asked by Dr.Davis to provide prenatal consultation for patient at risk for preterm delivery due to preeclampsia superimposed on chronic HTN.  Mother is 27 y.o. G1 who is now 26.[redacted] weeks EGA, admitted with severe features and was given BMZ about 1930 along with MgSO4 and multiple anti-hypertensives.   Patient was only intermittently aware and communicative, but her husband was present and interactive.  I discussed usual expectations for preterm infant at 45 - [redacted] weeks gestation with patient and her husband. including possible needs for DR resuscitation, respiratory support, IV access, and blood products.  Also presented risks of death and serious complications, projected possible length of stay in NICU until [facility] or longer.  Discussed advantages of feeding with mother's milk.  She plans to pump postnatally.  FOB was attentive, had appropriate questions, and was appreciative of my input.  Thank you for consulting Neonatology. Total time 25 minutes  Add: Time - 11:30 pm  OB staff had hoped to defer delivery, but she has continued to deteriorate despite treatment and decision has been made to proceed with delivery (C/section due to breech presentation).  JWimmer, MD

## 2017-12-07 ENCOUNTER — Encounter (HOSPITAL_COMMUNITY): Payer: Self-pay | Admitting: *Deleted

## 2017-12-07 DIAGNOSIS — O1424 HELLP syndrome, complicating childbirth: Secondary | ICD-10-CM | POA: Diagnosis not present

## 2017-12-07 DIAGNOSIS — Z3A26 26 weeks gestation of pregnancy: Secondary | ICD-10-CM | POA: Diagnosis not present

## 2017-12-07 LAB — PREPARE PLATELET PHERESIS: Unit division: 0

## 2017-12-07 LAB — COMPREHENSIVE METABOLIC PANEL
ALT: 275 U/L — ABNORMAL HIGH (ref 0–44)
ALT: 189 U/L — ABNORMAL HIGH (ref 0–44)
AST: 471 U/L — ABNORMAL HIGH (ref 15–41)
AST: 252 U/L — ABNORMAL HIGH (ref 15–41)
Albumin: 3.1 g/dL — ABNORMAL LOW (ref 3.5–5.0)
Albumin: 2.5 g/dL — ABNORMAL LOW (ref 3.5–5.0)
Alkaline Phosphatase: 60 U/L (ref 38–126)
Alkaline Phosphatase: 41 U/L (ref 38–126)
Anion gap: 10 (ref 5–15)
Anion gap: 12 (ref 5–15)
BUN: 14 mg/dL (ref 6–20)
BUN: 14 mg/dL (ref 6–20)
CO2: 23 mmol/L (ref 22–32)
CO2: 21 mmol/L — ABNORMAL LOW (ref 22–32)
Calcium: 7.9 mg/dL — ABNORMAL LOW (ref 8.9–10.3)
Calcium: 7.5 mg/dL — ABNORMAL LOW (ref 8.9–10.3)
Chloride: 99 mmol/L (ref 98–111)
Chloride: 100 mmol/L (ref 98–111)
Creatinine, Ser: 0.79 mg/dL (ref 0.44–1.00)
Creatinine, Ser: 0.68 mg/dL (ref 0.44–1.00)
GFR calc Af Amer: >60 mL/min (ref >60)
GFR calc Af Amer: >60 mL/min (ref >60)
GFR calc non Af Amer: >60 mL/min (ref >60)
GFR calc non Af Amer: >60 mL/min (ref >60)
Glucose, Bld: 164 mg/dL — ABNORMAL HIGH (ref 70–99)
Glucose, Bld: 127 mg/dL — ABNORMAL HIGH (ref 70–99)
Potassium: 4 mmol/L (ref 3.5–5.1)
Potassium: 4 mmol/L (ref 3.5–5.1)
Sodium: 132 mmol/L — ABNORMAL LOW (ref 135–145)
Sodium: 133 mmol/L — ABNORMAL LOW (ref 135–145)
Total Bilirubin: 1.1 mg/dL (ref 0.3–1.2)
Total Bilirubin: 0.9 mg/dL (ref 0.3–1.2)
Total Protein: 6.1 g/dL — ABNORMAL LOW (ref 6.5–8.1)
Total Protein: 4.6 g/dL — ABNORMAL LOW (ref 6.5–8.1)

## 2017-12-07 LAB — CBC WITH DIFFERENTIAL/PLATELET
Basophils Absolute: 0 10*3/uL (ref 0.0–0.1)
Basophils Relative: 0 %
Eosinophils Absolute: 0 10*3/uL (ref 0.0–0.5)
Eosinophils Relative: 0 %
HCT: 29 % — ABNORMAL LOW (ref 36.0–46.0)
Hemoglobin: 10.4 g/dL — ABNORMAL LOW (ref 12.0–15.0)
Lymphocytes Relative: 10 %
Lymphs Abs: 1.1 10*3/uL (ref 0.7–4.0)
MCH: 33.4 pg (ref 26.0–34.0)
MCHC: 35.9 g/dL (ref 30.0–36.0)
MCV: 93.2 fL (ref 80.0–100.0)
Monocytes Absolute: 0.3 10*3/uL (ref 0.1–1.0)
Monocytes Relative: 3 %
Neutro Abs: 9.3 10*3/uL — ABNORMAL HIGH (ref 1.7–7.7)
Neutrophils Relative %: 87 %
Platelets: 74 10*3/uL — ABNORMAL LOW (ref 150–400)
RBC: 3.11 MIL/uL — ABNORMAL LOW (ref 3.87–5.11)
RDW: 12.7 % (ref 11.5–15.5)
WBC: 10.6 10*3/uL — ABNORMAL HIGH (ref 4.0–10.5)
nRBC: 0 % (ref 0.0–0.2)

## 2017-12-07 LAB — CBC
HCT: 36.3 % (ref 36.0–46.0)
Hemoglobin: 12.9 g/dL (ref 12.0–15.0)
MCH: 32.8 pg (ref 26.0–34.0)
MCHC: 35.5 g/dL (ref 30.0–36.0)
MCV: 92.4 fL (ref 80.0–100.0)
Platelets: 100 10*3/uL — ABNORMAL LOW (ref 150–400)
RBC: 3.93 MIL/uL (ref 3.87–5.11)
RDW: 12.7 % (ref 11.5–15.5)
WBC: 13.1 10*3/uL — ABNORMAL HIGH (ref 4.0–10.5)
nRBC: 0 % (ref 0.0–0.2)

## 2017-12-07 LAB — HIV ANTIBODY (ROUTINE TESTING W REFLEX): HIV Screen 4th Generation wRfx: NONREACTIVE

## 2017-12-07 LAB — BPAM PLATELET PHERESIS
Blood Product Expiration Date: 201910142359
ISSUE DATE / TIME: 201910140025
Unit Type and Rh: 8400

## 2017-12-07 LAB — MRSA PCR SCREENING: MRSA by PCR: NEGATIVE

## 2017-12-07 LAB — RPR: RPR Ser Ql: NONREACTIVE

## 2017-12-07 MED ORDER — SODIUM CHLORIDE 0.9% IV SOLUTION: Freq: Once | INTRAVENOUS | Status: DC

## 2017-12-07 MED ORDER — DIPHENOXYLATE-ATROPINE 2.5-0.025 MG PO TABS
ORAL_TABLET | ORAL | Status: AC
  Administered 2017-12-07: 2
  Filled 2017-12-07: qty 1

## 2017-12-07 MED ORDER — TRANEXAMIC ACID 1000 MG/10ML IV SOLN
INTRAVENOUS | Status: AC
  Filled 2017-12-07: qty 10

## 2017-12-07 MED ORDER — OXYTOCIN 10 UNIT/ML IJ SOLN
INTRAMUSCULAR | Status: AC
  Filled 2017-12-07: qty 1

## 2017-12-07 MED ORDER — LABETALOL HCL 5 MG/ML IV SOLN
40.0000 mg | INTRAVENOUS | Status: DC | PRN
  Administered 2017-12-07: 40 mg via INTRAVENOUS
  Filled 2017-12-07 (×2): qty 8

## 2017-12-07 MED ORDER — LABETALOL HCL 5 MG/ML IV SOLN
INTRAVENOUS | Status: DC | PRN
  Administered 2017-12-07: 5 mg via INTRAVENOUS

## 2017-12-07 MED ORDER — OXYTOCIN 40 UNITS IN LACTATED RINGERS INFUSION - SIMPLE MED: 2.5000 [IU]/h | INTRAVENOUS | Status: AC

## 2017-12-07 MED ORDER — MENTHOL 3 MG MT LOZG
1.0000 | LOZENGE | OROMUCOSAL | Status: DC | PRN
  Filled 2017-12-07 (×2): qty 9

## 2017-12-07 MED ORDER — MIDAZOLAM HCL 2 MG/2ML IJ SOLN
INTRAMUSCULAR | Status: DC | PRN
  Administered 2017-12-07: 2 mg via INTRAVENOUS

## 2017-12-07 MED ORDER — SIMETHICONE 80 MG PO CHEW
80.0000 mg | CHEWABLE_TABLET | ORAL | Status: DC
  Administered 2017-12-07: 80 mg via ORAL
  Filled 2017-12-07 (×2): qty 1

## 2017-12-07 MED ORDER — TRANEXAMIC ACID-NACL 1000-0.7 MG/100ML-% IV SOLN
1000.0000 mg | Freq: Once | INTRAVENOUS | Status: DC | PRN
  Filled 2017-12-07: qty 100

## 2017-12-07 MED ORDER — HYDRALAZINE HCL 20 MG/ML IJ SOLN
5.0000 mg | INTRAMUSCULAR | Status: DC | PRN
  Administered 2017-12-07 – 2017-12-09 (×4): 5 mg via INTRAVENOUS
  Filled 2017-12-07 (×6): qty 1

## 2017-12-07 MED ORDER — BUPIVACAINE HCL (PF) 0.5 % IJ SOLN
INTRAMUSCULAR | Status: DC | PRN
  Administered 2017-12-07: 30 mL

## 2017-12-07 MED ORDER — TRANEXAMIC ACID-NACL 1000-0.7 MG/100ML-% IV SOLN
1000.0000 mg | INTRAVENOUS | Status: AC
  Administered 2017-12-07: 1000 mg via INTRAVENOUS

## 2017-12-07 MED ORDER — PROPOFOL 10 MG/ML IV BOLUS
INTRAVENOUS | Status: DC | PRN
  Administered 2017-12-07: 200 mg via INTRAVENOUS

## 2017-12-07 MED ORDER — PRENATAL MULTIVITAMIN CH
1.0000 | ORAL_TABLET | Freq: Every day | ORAL | Status: DC
  Administered 2017-12-08 – 2017-12-11 (×4): 1 via ORAL
  Filled 2017-12-07 (×6): qty 1

## 2017-12-07 MED ORDER — OXYCODONE HCL 5 MG PO TABS
5.0000 mg | ORAL_TABLET | ORAL | Status: DC | PRN
  Administered 2017-12-10: 5 mg via ORAL
  Filled 2017-12-07 (×3): qty 1

## 2017-12-07 MED ORDER — ONDANSETRON HCL 4 MG/2ML IJ SOLN: 4.0000 mg | Freq: Once | INTRAMUSCULAR | Status: DC | PRN

## 2017-12-07 MED ORDER — DEXAMETHASONE SODIUM PHOSPHATE 10 MG/ML IJ SOLN
INTRAMUSCULAR | Status: DC | PRN
  Administered 2017-12-07: 4 mg via INTRAVENOUS

## 2017-12-07 MED ORDER — ONDANSETRON HCL 4 MG/2ML IJ SOLN
INTRAMUSCULAR | Status: DC | PRN
  Administered 2017-12-07: 4 mg via INTRAVENOUS

## 2017-12-07 MED ORDER — DIPHENOXYLATE-ATROPINE 2.5-0.025 MG PO TABS
2.0000 | ORAL_TABLET | Freq: Once | ORAL | Status: AC
  Administered 2017-12-07: 2 via ORAL

## 2017-12-07 MED ORDER — SIMETHICONE 80 MG PO CHEW
80.0000 mg | CHEWABLE_TABLET | Freq: Three times a day (TID) | ORAL | Status: DC
  Administered 2017-12-07 – 2017-12-11 (×11): 80 mg via ORAL
  Filled 2017-12-07 (×14): qty 1

## 2017-12-07 MED ORDER — DIBUCAINE 1 % RE OINT
1.0000 "application " | TOPICAL_OINTMENT | RECTAL | Status: DC | PRN
  Filled 2017-12-07: qty 28

## 2017-12-07 MED ORDER — FENTANYL CITRATE (PF) 100 MCG/2ML IJ SOLN
INTRAMUSCULAR | Status: DC | PRN
  Administered 2017-12-07 (×2): 50 ug via INTRAVENOUS
  Administered 2017-12-07 (×2): 100 ug via INTRAVENOUS

## 2017-12-07 MED ORDER — LACTATED RINGERS IV SOLN: INTRAVENOUS | Status: DC

## 2017-12-07 MED ORDER — FENTANYL CITRATE (PF) 100 MCG/2ML IJ SOLN
INTRAMUSCULAR | Status: AC
  Administered 2017-12-07: 50 ug via INTRAVENOUS
  Filled 2017-12-07: qty 2

## 2017-12-07 MED ORDER — WITCH HAZEL-GLYCERIN EX PADS: 1.0000 "application " | MEDICATED_PAD | CUTANEOUS | Status: DC | PRN

## 2017-12-07 MED ORDER — LABETALOL HCL 5 MG/ML IV SOLN
20.0000 mg | INTRAVENOUS | Status: DC | PRN
  Administered 2017-12-07 – 2017-12-10 (×6): 20 mg via INTRAVENOUS
  Filled 2017-12-07 (×8): qty 4

## 2017-12-07 MED ORDER — FENTANYL CITRATE (PF) 100 MCG/2ML IJ SOLN
INTRAMUSCULAR | Status: AC
  Filled 2017-12-07: qty 2

## 2017-12-07 MED ORDER — AMLODIPINE BESYLATE 10 MG PO TABS
10.0000 mg | ORAL_TABLET | Freq: Every day | ORAL | Status: DC
  Administered 2017-12-07 – 2017-12-11 (×5): 10 mg via ORAL
  Filled 2017-12-07 (×5): qty 1

## 2017-12-07 MED ORDER — OXYTOCIN 10 UNIT/ML IJ SOLN
INTRAMUSCULAR | Status: DC | PRN
  Administered 2017-12-07: 40 [IU]
  Administered 2017-12-07: 10 [IU] via INTRAMUSCULAR
  Administered 2017-12-07: 40 [IU]

## 2017-12-07 MED ORDER — DIPHENOXYLATE-ATROPINE 2.5-0.025 MG PO TABS
ORAL_TABLET | ORAL | Status: AC
  Filled 2017-12-07: qty 1

## 2017-12-07 MED ORDER — LACTATED RINGERS IV SOLN
INTRAVENOUS | Status: DC | PRN
  Administered 2017-12-06 – 2017-12-07 (×3): via INTRAVENOUS

## 2017-12-07 MED ORDER — SOD CITRATE-CITRIC ACID 500-334 MG/5ML PO SOLN: 30.0000 mL | ORAL | Status: DC

## 2017-12-07 MED ORDER — LABETALOL HCL 5 MG/ML IV SOLN
INTRAVENOUS | Status: AC
  Filled 2017-12-07: qty 4

## 2017-12-07 MED ORDER — TETANUS-DIPHTH-ACELL PERTUSSIS 5-2.5-18.5 LF-MCG/0.5 IM SUSP
0.5000 mL | Freq: Once | INTRAMUSCULAR | Status: AC
  Administered 2017-12-09: 0.5 mL via INTRAMUSCULAR
  Filled 2017-12-07: qty 0.5

## 2017-12-07 MED ORDER — CEFAZOLIN SODIUM-DEXTROSE 2-4 GM/100ML-% IV SOLN: 2.0000 g | Freq: Once | INTRAVENOUS | Status: DC

## 2017-12-07 MED ORDER — SUCCINYLCHOLINE CHLORIDE 20 MG/ML IJ SOLN
INTRAMUSCULAR | Status: DC | PRN
  Administered 2017-12-07: 120 mg via INTRAVENOUS

## 2017-12-07 MED ORDER — OXYCODONE HCL 5 MG/5ML PO SOLN: 5.0000 mg | Freq: Once | ORAL | Status: DC | PRN

## 2017-12-07 MED ORDER — OXYCODONE HCL 5 MG PO TABS
10.0000 mg | ORAL_TABLET | ORAL | Status: DC | PRN
  Administered 2017-12-07 – 2017-12-11 (×17): 10 mg via ORAL
  Filled 2017-12-07 (×16): qty 2

## 2017-12-07 MED ORDER — FENTANYL CITRATE (PF) 100 MCG/2ML IJ SOLN
25.0000 ug | INTRAMUSCULAR | Status: DC | PRN
  Administered 2017-12-07 (×2): 50 ug via INTRAVENOUS

## 2017-12-07 MED ORDER — HYDRALAZINE HCL 20 MG/ML IJ SOLN
10.0000 mg | INTRAMUSCULAR | Status: DC | PRN
  Administered 2017-12-08 – 2017-12-09 (×4): 10 mg via INTRAVENOUS
  Filled 2017-12-07 (×2): qty 1

## 2017-12-07 MED ORDER — SIMETHICONE 80 MG PO CHEW
80.0000 mg | CHEWABLE_TABLET | ORAL | Status: DC | PRN
  Filled 2017-12-07: qty 1

## 2017-12-07 MED ORDER — LABETALOL HCL 200 MG PO TABS
200.0000 mg | ORAL_TABLET | Freq: Two times a day (BID) | ORAL | Status: DC
  Administered 2017-12-07: 200 mg via ORAL
  Filled 2017-12-07: qty 1

## 2017-12-07 MED ORDER — FENTANYL CITRATE (PF) 100 MCG/2ML IJ SOLN
50.0000 ug | Freq: Once | INTRAMUSCULAR | Status: AC
  Administered 2017-12-07: 50 ug via INTRAVENOUS

## 2017-12-07 MED ORDER — OXYCODONE HCL 5 MG PO TABS: 5.0000 mg | ORAL_TABLET | Freq: Once | ORAL | Status: DC | PRN

## 2017-12-07 MED ORDER — IBUPROFEN 600 MG PO TABS: 600.0000 mg | ORAL_TABLET | Freq: Four times a day (QID) | ORAL | Status: DC

## 2017-12-07 MED ORDER — COCONUT OIL OIL
1.0000 "application " | TOPICAL_OIL | Status: DC | PRN
  Administered 2017-12-07: 1 via TOPICAL
  Filled 2017-12-07 (×2): qty 120

## 2017-12-07 MED ORDER — CEFAZOLIN SODIUM-DEXTROSE 2-3 GM-%(50ML) IV SOLR
INTRAVENOUS | Status: DC | PRN
  Administered 2017-12-07: 2 g via INTRAVENOUS

## 2017-12-07 MED ORDER — NIFEDIPINE ER OSMOTIC RELEASE 30 MG PO TB24
30.0000 mg | ORAL_TABLET | Freq: Two times a day (BID) | ORAL | Status: DC
  Administered 2017-12-07: 30 mg via ORAL
  Filled 2017-12-07: qty 1

## 2017-12-07 MED ORDER — LIDOCAINE HCL (CARDIAC) PF 100 MG/5ML IV SOSY
PREFILLED_SYRINGE | INTRAVENOUS | Status: DC | PRN
  Administered 2017-12-07: 50 mg via INTRAVENOUS

## 2017-12-07 MED ORDER — CARBOPROST TROMETHAMINE 250 MCG/ML IM SOLN
INTRAMUSCULAR | Status: DC | PRN
  Administered 2017-12-07: 250 ug via INTRAMUSCULAR

## 2017-12-07 MED ORDER — CARBOPROST TROMETHAMINE 250 MCG/ML IM SOLN
INTRAMUSCULAR | Status: AC
  Filled 2017-12-07: qty 1

## 2017-12-07 MED ORDER — ACETAMINOPHEN 325 MG PO TABS: 650.0000 mg | ORAL_TABLET | ORAL | Status: DC | PRN

## 2017-12-07 MED ORDER — SCOPOLAMINE 1 MG/3DAYS TD PT72
MEDICATED_PATCH | TRANSDERMAL | Status: DC | PRN
  Administered 2017-12-07: 1 via TRANSDERMAL

## 2017-12-07 NOTE — Lactation Note (Signed)
This note was copied from a baby's chart. Lactation Consultation Note  Patient Name: Krystal [name] Today's Date: 12/07/2017 Reason for consult: Initial assessment;Primapara;1st time breastfeeding;NICU baby;Infant < 6lbs;Preterm <34wks  Visited with P1 Mom of [redacted]w[redacted]d baby in the NICU.  Baby 24 hrs old.  Mom had emergency C/S for per-eclampsia. Mom was set up with DEBP and assisted with pumping already.  Mom getting drops. Assisted with breast massage and hand expression.  Drops obtained in Snappy container, labeled for NICU. Mom aware of importance of double pumping >8 times in 24 hrs, adding hand expression and breast massage. Mom has one amputated arm, showed how to hold both flanges to breast with one hand.  To get her sport's bra from home and make a "hand's free bra" with it.   Talked about having a DEBP for home use.  Recommended renting a Symphony from gift shop.   NICU brochure and Lactation brochure given to Mom.    Maternal Data Formula Feeding for Exclusion: Yes Reason for exclusion: Admission to Intensive Care Unit (ICU) post-partum Has patient been taught Hand Expression?: Yes Does the patient have breastfeeding experience prior to this delivery?: No  Feeding    LATCH Score                   Interventions Interventions: Breast feeding basics reviewed;Skin to skin;Breast massage;Hand express;DEBP  Lactation Tools Discussed/Used Tools: Pump Breast pump type: Double-Electric Breast Pump WIC Program: No Pump Review: Setup, frequency, and cleaning;Milk Storage Initiated by:: Cipriano Mile RN IBCLC Date initiated:: 12/07/17   Consult Status Consult Status: Follow-up Date: 12/08/17 Follow-up type: [place] 12/07/2017, 11:14 AM

## 2017-12-07 NOTE — Transfer of Care (Signed)
Immediate Anesthesia Transfer of Care Note  Patient: Krystal [name]  Procedure(s) Performed: CESAREAN SECTION (N/A )  Patient Location: PACU  Anesthesia Type:General  Level of Consciousness: awake, alert  and patient cooperative  Airway & Oxygen Therapy: Patient Spontanous Breathing and Patient connected to nasal cannula oxygen  Post-op Assessment: Report given to RN and Post -op Vital signs reviewed and stable  Post vital signs: Reviewed and stable  Last Vitals:  Vitals Value Taken Time  BP 159/100 12/07/2017  1:45 AM  Temp 36.4 C 12/07/2017  1:39 AM  Pulse 84 12/07/2017  1:46 AM  Resp 14 12/07/2017  1:46 AM  SpO2 99 % 12/07/2017  1:46 AM  Vitals shown include unvalidated device data.  Last Pain:  Vitals:   12/06/17 2100  TempSrc: Oral  PainSc:       Patients Stated Pain Goal: 0 (41/58/30 9407)  Complications: No apparent anesthesia complications

## 2017-12-07 NOTE — Op Note (Signed)
Krystal [name] PROCEDURE DATE: 12/07/2017  PREOPERATIVE DIAGNOSES: Intrauterine pregnancy at [redacted]w[redacted]d weeks gestation; chronic hypertension with superimposed HELLP  POSTOPERATIVE DIAGNOSES: The same  PROCEDURE: Primary Low Transverse Cesarean Section  SURGEON:  Feliz Beam, MD  ASSISTANT:  Aura Camps, MD  An experienced assistant was required given the standard of surgical care given the complexity of the case.  This assistant was needed for exposure, dissection, suctioning, retraction, instrument exchange, assisting with delivery with administration of fundal pressure, and for overall help during the procedure.  ANESTHESIOLOGY TEAM: Anesthesiologist: Lidia Collum, MD CRNA: Sandrea Matte, CRNA  INDICATIONS: Krystal [name] is a 27 y.o. G1P0 at [redacted]w[redacted]d here for cesarean section secondary to the indications listed under preoperative diagnoses; please see preoperative note for further details.  The risks of cesarean section were discussed with the patient including but were not limited to: bleeding which may require transfusion or reoperation; infection which may require antibiotics; injury to bowel, bladder, ureters or other surrounding organs; injury to the fetus; need for additional procedures including hysterectomy in the event of a life-threatening hemorrhage; placental abnormalities wth subsequent pregnancies, incisional problems, thromboembolic phenomenon and other postoperative/anesthesia complications.  She consents to blood transfusion in the event of an emergency. The patient verbalized understanding of the plan, giving informed written consent for the procedure.    FINDINGS:  Viable female infant in cephalic presentation.  Apgars 3, 4, 6.  Yellow tinged clear amniotic fluid.  Intact placenta very adherent to posterior uterine wall, three vessel cord.  Otherwise, normal uterus, fallopian tubes and ovaries bilaterally.  ANESTHESIA: General anesthesia  INTRAVENOUS  FLUIDS: 1300 ml   ESTIMATED BLOOD LOSS: 400 ml URINE OUTPUT:  75 ml SPECIMENS: Placenta sent to pathology COMPLICATIONS: None immediate  PROCEDURE IN DETAIL:  The patient preoperatively received intravenous antibiotics and had sequential compression devices applied to her lower extremities.  She was then taken to the operating room, placed in dorsal supine position with a leftward tilt and prepped and draped in the usual fashion. She already had a foley catheter in place attached to gravity drainage. General anesthesia was obtained and the patient was intubated without difficulty. She was then placed in a dorsal supine position with a leftward tilt, and prepped and draped in a sterile manner.  After a timeout was performed, a Pfannenstiel skin incision was made with scalpel and carried through to the underlying layer of fascia. The fascia was incised in the midline, and this incision was extended bilaterally using the Mayo scissors.  Kocher clamps were applied to the superior aspect of the fascial incision and the underlying rectus muscles were dissected off bluntly.  A similar process was carried out on the inferior aspect of the fascial incision. The rectus muscles were separated in the midline bluntly and the peritoneum was entered bluntly. The peritoneal incision was carefully extended bluntly laterally and caudad with good visualization of the bladder. The uterus appeared normal with a well developed lower uterine segment. The bladder blade was inserted and the the bladder was lifted gently and bluntly dissected off the lower uterine segment. The bladder blade was then reinserted. Attention was turned to the lower uterine segment where a low transverse hysterotomy was made with a scalpel and extended bilaterally bluntly.  The infant was delivered from footling breech position via the usual maneuvers, had poor tone and cord immediately clamped and infant handed to waiting NICU staff. Uterine massage was  then performed, and the placenta delivered intact with a three-vessel  cord. The placenta was abnormally adherent to the uterine bed on the posterior wall and had to be manually removed. Once removed, the bed appeared free from placenta and debris. The uterus was then gently exteriorized and cleared of clots and debris.  The hysterotomy was closed with 0 Vicryl in a running locked fashion, and an imbricating layer was also placed with 0 Vicryl. The fallopian tubes and ovaries were visualized bilaterally and normal appearing. The uterus had very poor tone despite IM pitocin, TXA, hemabate given. No significant bleeding noted, however uterus continued to have poor tone and B-Lynch suture placed on uterus with mildly improved effect. Continued massage and pitocin improved tone slightly. The uterus was then gently replaced within the abdomen and observed over several minutes, it did not appear to bleed or fill with blood. The vagina was inspected and patient noted to have minimal blood from vagina either.   The pelvis was cleared of all clot and debris. Arista was placed over the hysterotomy for additional hemostasis.  Hemostasis was again confirmed on all surfaces. Uterine tone at this point slightly improved.  The peritoneum was re-approximated using 2-0 Vicryl suture. The fascia was then closed using 0 Vicryl in a running fashion.  The subcutaneous layer was irrigated, then reapproximated with 2-0 plain gut, and 30 ml of 0.5% Marcaine was injected subcutaneously around the incision.  The skin was closed with a 4-0 Monocryl subcuticular stitch. The patient tolerated the procedure well. Sponge, lap, instrument and needle counts were correct x 3.  Patient was extubated and taken to the recovery room in stable condition.    Feliz Beam, M.D. Attending [place], [facility] for [employer], [place]

## 2017-12-07 NOTE — Addendum Note (Signed)
Addendum  created 12/07/17 0947 by Hewitt Blade, CRNA   Sign clinical note

## 2017-12-07 NOTE — Anesthesia Postprocedure Evaluation (Signed)
Anesthesia Post Note  Patient: Tiena C Nevitt  Procedure(s) Performed: CESAREAN SECTION (N/A )     Patient location during evaluation: PACU Anesthesia Type: General Level of consciousness: awake and alert Pain management: pain level controlled Vital Signs Assessment: post-procedure vital signs reviewed and stable Respiratory status: spontaneous breathing, nonlabored ventilation and respiratory function stable Cardiovascular status: blood pressure returned to baseline and stable Postop Assessment: no apparent nausea or vomiting Anesthetic complications: no    Last Vitals:  Vitals:   12/07/17 0302 12/07/17 0330  BP:  (!) 156/89  Pulse: 89 83  Resp: 18 19  Temp:  36.8 C  SpO2: 97%     Last Pain:  Vitals:   12/07/17 0330  TempSrc: Oral  PainSc:    Pain Goal: Patients Stated Pain Goal: 2 (12/07/17 0245)               Lidia Collum

## 2017-12-07 NOTE — Progress Notes (Signed)
Faculty Attending Note  Post Op Day 0  Subjective: Patient is feeling much better, states headache has improved significantly. Still having epigastric pain but it is decreased. She reports moderately well controlled pain on IV pain meds. She is not ambulating yet due to MgSO4, denies light-headedness or dizziness. She has catheter in bladder. She is not passing flatus, has not had a BM yet. She is tolerating clear liquids without nausea/vomiting. Bleeding is moderate. She is bottle feeding. Baby is in NICU and doing well, she would like to go see baby today.  Objective: Blood pressure (!) 154/100, pulse 78, temperature 98.2 F (36.8 C), temperature source Oral, resp. rate 18, height 5\' 11"  (1.803 m), weight 107.5 kg, last menstrual period 06/03/2017, SpO2 98 %. Temp:  [97.5 F (36.4 C)-98.7 F (37.1 C)] 98.2 F (36.8 C) (10/14 0330) Pulse Rate:  [60-106] 78 (10/14 0628) Resp:  [11-26] 18 (10/14 0628) BP: (143-215)/(88-125) 154/100 (10/14 0628) SpO2:  [96 %-100 %] 98 % (10/14 0630) Weight:  [107.5 kg] 107.5 kg (10/13 1817)  Physical Exam:  General: alert, oriented, cooperative Chest: normal respiratory effort Heart: RRR  Abdomen: soft, appropriately tender to palpation, incision covered by dressing with no evidence of active bleeding  Uterine Fundus: difficult to palpate under dressing Lochia: minimal, rubra DVT Evaluation: no evidence of DVT Extremities: no edema, no calf tenderness  UOP: 100 mL/hr clear yellow urine   Current Facility-Administered Medications:  .  acetaminophen (TYLENOL) tablet 650 mg, 650 mg, Oral, Q4H PRN, Sloan Leiter, MD .  coconut oil, 1 application, Topical, PRN, Sloan Leiter, MD .  witch hazel-glycerin (TUCKS) pad 1 application, 1 application, Topical, PRN **AND** dibucaine (NUPERCAINAL) 1 % rectal ointment 1 application, 1 application, Rectal, PRN, Sloan Leiter, MD .  diphenoxylate-atropine (LOMOTIL) 2.5-0.025 MG per tablet, , , ,  .  hydrALAZINE  (APRESOLINE) injection 5 mg, 5 mg, Intravenous, PRN **AND** hydrALAZINE (APRESOLINE) injection 10 mg, 10 mg, Intravenous, PRN **AND** labetalol (NORMODYNE,TRANDATE) injection 20 mg, 20 mg, Intravenous, PRN **AND** labetalol (NORMODYNE,TRANDATE) injection 40 mg, 40 mg, Intravenous, PRN **AND** Measure blood pressure, , , Once, Sloan Leiter, MD .  ibuprofen (ADVIL,MOTRIN) tablet 600 mg, 600 mg, Oral, Q6H, Sloan Leiter, MD .  lactated ringers infusion, , Intravenous, Continuous, Sloan Leiter, MD .  lactated ringers infusion, , Intravenous, Continuous, Sloan Leiter, MD .  magnesium sulfate 40 grams in LR 500 mL OB infusion, 2 g/hr, Intravenous, Titrated, Sloan Leiter, MD, Last Rate: 25 mL/hr at 12/06/17 2357, 2 g/hr at 12/06/17 2357 .  menthol-cetylpyridinium (CEPACOL) lozenge 3 mg, 1 lozenge, Oral, Q2H PRN, Sloan Leiter, MD .  oxyCODONE (Oxy IR/ROXICODONE) immediate release tablet 10 mg, 10 mg, Oral, Q4H PRN, Sloan Leiter, MD, 10 mg at 12/07/17 0606 .  oxyCODONE (Oxy IR/ROXICODONE) immediate release tablet 5 mg, 5 mg, Oral, Q4H PRN, Sloan Leiter, MD .  oxytocin (PITOCIN) IV infusion 40 units in LR 1000 mL - Premix, 2.5 Units/hr, Intravenous, Continuous, Sloan Leiter, MD .  prenatal multivitamin tablet 1 tablet, 1 tablet, Oral, Q1200, Sloan Leiter, MD .  simethicone [facility]) chewable tablet 80 mg, 80 mg, Oral, TID PC, Sloan Leiter, MD .  Derrill Memo ON 62/69/4854] simethicone (MYLICON) chewable tablet 80 mg, 80 mg, Oral, Q24H, Sloan Leiter, MD .  simethicone [facility]) chewable tablet 80 mg, 80 mg, Oral, PRN, Sloan Leiter, MD .  sodium chloride 0.9 % with tranexamic acid (CYKLOKAPRON) ADS Med, , , ,  .  [  START ON 12/08/2017] Tdap (BOOSTRIX) injection 0.5 mL, 0.5 mL, Intramuscular, Once, Sloan Leiter, MD Recent Labs    12/06/17 2231 12/07/17 0551  HGB 13.3 12.9  HCT 37.2 36.3    Assessment/Plan:  Patient is 27 y.o. G1P0 POD#0 s/p 1LTCS at [redacted]w[redacted]d for chronic hypertension with  superimposed HELLP. She is doing very well, much improved in regards to her symptoms. Lab work starting to trend in appropriate direction. Will stay on MgSO4 x 24 hrs. Will have her see baby today, infant in NICU. Will start on labetalol 200 mg BID for chronic HTN.   Continue routine post partum care MgSO4 until 0030 10/15 To start labetalol 200 mg BID Pain meds prn Clear liquid diet for now Repeat lab work 11 am PICC line in place    K. Arvilla Meres, M.D. [facility] [place]  12/07/2017, 6:58 AM

## 2017-12-07 NOTE — Anesthesia Postprocedure Evaluation (Signed)
Anesthesia Post Note  Patient: Krystal [name]  Procedure(s) Performed: CESAREAN SECTION (N/A )     Patient location during evaluation: [facility] Anesthesia Type: General Level of consciousness: awake and alert and oriented Pain management: pain level controlled Vital Signs Assessment: post-procedure vital signs reviewed and stable Respiratory status: spontaneous breathing, nonlabored ventilation and respiratory function stable Cardiovascular status: stable Postop Assessment: no apparent nausea or vomiting and adequate PO intake Anesthetic complications: no    Last Vitals:  Vitals:   12/07/17 0734 12/07/17 0815  BP: (!) 159/106   Pulse: 79   Resp: 16   Temp: 36.8 C   SpO2: 100% 98%    Last Pain:  Vitals:   12/07/17 0815  TempSrc:   PainSc: 8    Pain Goal: Patients Stated Pain Goal: 2 (12/07/17 0815)               Jabier Mutton

## 2017-12-07 NOTE — Anesthesia Procedure Notes (Signed)
Procedure Name: Intubation Date/Time: 12/07/2017 12:05 AM Performed by: Sandrea Matte, CRNA Pre-anesthesia Checklist: Patient identified Patient Re-evaluated:Patient Re-evaluated prior to induction Oxygen Delivery Method: Circle system utilized Preoxygenation: Pre-oxygenation with 100% oxygen Induction Type: Cricoid Pressure applied and Rapid sequence Laryngoscope Size: Mac and 4 Grade View: Grade II Tube type: Oral Tube size: 7.0 mm Number of attempts: 1 Airway Equipment and Method: Stylet Placement Confirmation: ETT inserted through vocal cords under direct vision,  positive ETCO2 and breath sounds checked- equal and bilateral Secured at: 22 cm Tube secured with: Tape Dental Injury: Teeth and Oropharynx as per pre-operative assessment

## 2017-12-08 DIAGNOSIS — O1420 HELLP syndrome (HELLP), unspecified trimester: Secondary | ICD-10-CM | POA: Diagnosis present

## 2017-12-08 DIAGNOSIS — Z3A26 26 weeks gestation of pregnancy: Secondary | ICD-10-CM | POA: Diagnosis not present

## 2017-12-08 DIAGNOSIS — Z87891 Personal history of nicotine dependence: Secondary | ICD-10-CM | POA: Diagnosis not present

## 2017-12-08 DIAGNOSIS — O328XX0 Maternal care for other malpresentation of fetus, not applicable or unspecified: Secondary | ICD-10-CM | POA: Diagnosis present

## 2017-12-08 DIAGNOSIS — O1424 HELLP syndrome, complicating childbirth: Secondary | ICD-10-CM | POA: Diagnosis present

## 2017-12-08 DIAGNOSIS — O114 Pre-existing hypertension with pre-eclampsia, complicating childbirth: Secondary | ICD-10-CM | POA: Diagnosis present

## 2017-12-08 HISTORY — DX: HELLP syndrome (HELLP), unspecified trimester: O14.20

## 2017-12-08 LAB — COMPREHENSIVE METABOLIC PANEL
ALT: 172 U/L — ABNORMAL HIGH (ref 0–44)
AST: 134 U/L — ABNORMAL HIGH (ref 15–41)
Albumin: 3 g/dL — ABNORMAL LOW (ref 3.5–5.0)
Alkaline Phosphatase: 51 U/L (ref 38–126)
Anion gap: 9 (ref 5–15)
BUN: 17 mg/dL (ref 6–20)
CO2: 24 mmol/L (ref 22–32)
Calcium: 7.4 mg/dL — ABNORMAL LOW (ref 8.9–10.3)
Chloride: 103 mmol/L (ref 98–111)
Creatinine, Ser: 0.71 mg/dL (ref 0.44–1.00)
GFR calc Af Amer: >60 mL/min (ref >60)
GFR calc non Af Amer: >60 mL/min (ref >60)
Glucose, Bld: 121 mg/dL — ABNORMAL HIGH (ref 70–99)
Potassium: 3.8 mmol/L (ref 3.5–5.1)
Sodium: 136 mmol/L (ref 135–145)
Total Bilirubin: 0.7 mg/dL (ref 0.3–1.2)
Total Protein: 5.5 g/dL — ABNORMAL LOW (ref 6.5–8.1)

## 2017-12-08 LAB — HEPATITIS B SURFACE ANTIBODY, QUANTITATIVE: Hepatitis B-Post: 5.7 m[IU]/mL — ABNORMAL LOW (ref >9.9)

## 2017-12-08 LAB — CBC
HCT: 30.3 % — ABNORMAL LOW (ref 36.0–46.0)
Hemoglobin: 10.5 g/dL — ABNORMAL LOW (ref 12.0–15.0)
MCH: 32.7 pg (ref 26.0–34.0)
MCHC: 34.7 g/dL (ref 30.0–36.0)
MCV: 94.4 fL (ref 80.0–100.0)
Platelets: 102 10*3/uL — ABNORMAL LOW (ref 150–400)
RBC: 3.21 MIL/uL — ABNORMAL LOW (ref 3.87–5.11)
RDW: 12.9 % (ref 11.5–15.5)
WBC: 16.1 10*3/uL — ABNORMAL HIGH (ref 4.0–10.5)

## 2017-12-08 LAB — HEPATITIS B SURFACE ANTIGEN: Hepatitis B Surface Ag: NEGATIVE

## 2017-12-08 LAB — HEPATITIS C ANTIBODY (REFLEX): HCV Ab: 0.1 s/co ratio (ref 0.0–0.9)

## 2017-12-08 LAB — HCV COMMENT:

## 2017-12-08 MED ORDER — POLYETHYLENE GLYCOL 3350 17 G PO PACK
17.0000 g | PACK | Freq: Every day | ORAL | Status: DC
  Administered 2017-12-08 – 2017-12-11 (×4): 17 g via ORAL
  Filled 2017-12-08 (×4): qty 1

## 2017-12-08 MED ORDER — BISACODYL 5 MG PO TBEC: 5.0000 mg | DELAYED_RELEASE_TABLET | Freq: Every day | ORAL | Status: DC

## 2017-12-08 MED ORDER — IBUPROFEN 600 MG PO TABS
600.0000 mg | ORAL_TABLET | Freq: Four times a day (QID) | ORAL | Status: DC | PRN
  Administered 2017-12-08 – 2017-12-11 (×7): 600 mg via ORAL
  Filled 2017-12-08 (×8): qty 1

## 2017-12-08 MED ORDER — DIPHENHYDRAMINE HCL 50 MG/ML IJ SOLN
25.0000 mg | Freq: Once | INTRAMUSCULAR | Status: AC
  Administered 2017-12-08: 25 mg via INTRAVENOUS
  Filled 2017-12-08: qty 1

## 2017-12-08 NOTE — Lactation Note (Signed)
This note was copied from a baby's chart. Lactation Consultation Note: Follow up visit with this mom of 26.5 week NICU baby. Mom reports she pumped 5 times yesterday. Encouraged to try to get 8 times/24 hours. Has just hand expressed and obtained about 2 ml to take to baby in NICU. States she plans to buy Medela pump for home. Mom teary this morning, states she is disappointed that her mom can't get here until December- she is a missionary in [place]. MIL has terminal cancer and was just put in hospice care. Suggested Chaplain is available here if she would like to speak to one. Asking about diet- reviewed with mom. No further questions at present. To call for assist prn  Patient Name: [name] [id number] Date: 12/08/2017 Reason for consult: Follow-up assessment;Preterm <34wks;1st time breastfeeding;NICU baby   Maternal Data Formula Feeding for Exclusion: Yes Reason for exclusion: Admission to Intensive Care Unit (ICU) post-partum Has patient been taught Hand Expression?: Yes Does the patient have breastfeeding experience prior to this delivery?: No  Feeding    LATCH Score                   Interventions    Lactation Tools Discussed/Used WIC Program: No   Consult Status Consult Status: Follow-up Date: 12/09/17 Follow-up type: In-patient    Truddie Crumble 12/08/2017, 7:59 AM

## 2017-12-08 NOTE — Progress Notes (Signed)
Subjective: Postpartum Day 1: Cesarean Delivery Patient reports feeling better since discontinuation of magnesium. She is ambulating and voiding well. She denies feeling dizzy or lightheaded. She denies chest pain or shortness of breath. She expressed concerns regarding postpartum depression. Patient with a history of depression and admits to feeling "empty since her delivery". She misses her infant. She denies suicidal or homicidal ideattions.    Objective: Vital signs in last 24 hours: Temp:  [97.8 F (36.6 C)-98.2 F (36.8 C)] 98.1 F (36.7 C) (10/15 0842) Pulse Rate:  [67-99] 78 (10/15 0909) Resp:  [16-20] 20 (10/15 0909) BP: (130-173)/(80-107) 157/95 (10/15 0909) SpO2:  [97 %-100 %] 100 % (10/15 6578)  Physical Exam:  General: alert, cooperative and no distress Lochia: appropriate Uterine Fundus: firm, non tender Incision: pressure dressing removed. Honeycomb dressing clean, dry and intact DVT Evaluation: No evidence of DVT seen on physical exam. No significant calf/ankle edema.  Recent Labs    12/07/17 1053 12/08/17 0042  HGB 10.4* 10.5*  HCT 29.0* 30.3*    Assessment/Plan: - Status post Cesarean section. Doing well postoperatively.  - Discussed depression symptoms and its normal occurrence given a premature birth. Offered to start antidepressant but patient desires to wait a few days. Will plan to have patient see our integrated behavioral health specialist upon discharge - Continue monitoring BP on amlodipine -Continue current care.  Dane Bloch 12/08/2017, 10:04 AM

## 2017-12-08 NOTE — Progress Notes (Signed)
Report called to Dr Harolyn Rutherford re: Bps of 180s/110s, pt c/o itching & extremity swelling, & nl PreE assessment.  Orders rec'd to begin ordered PreE meds & Benadry.

## 2017-12-09 LAB — URINALYSIS, ROUTINE W REFLEX MICROSCOPIC
Bilirubin Urine: NEGATIVE
Glucose, UA: NEGATIVE mg/dL
Ketones, ur: NEGATIVE mg/dL
Leukocytes, UA: NEGATIVE
Nitrite: NEGATIVE
Protein, ur: NEGATIVE mg/dL
Specific Gravity, Urine: 1.01 (ref 1.005–1.030)
pH: 7 (ref 5.0–8.0)

## 2017-12-09 MED ORDER — LABETALOL HCL 5 MG/ML IV SOLN
INTRAVENOUS | Status: AC
  Filled 2017-12-09: qty 4

## 2017-12-09 MED ORDER — HYDRALAZINE HCL 20 MG/ML IJ SOLN: 10.0000 mg | INTRAMUSCULAR | Status: DC | PRN

## 2017-12-09 MED ORDER — LABETALOL HCL 5 MG/ML IV SOLN
40.0000 mg | INTRAVENOUS | Status: DC | PRN
  Administered 2017-12-10: 40 mg via INTRAVENOUS

## 2017-12-09 MED ORDER — LABETALOL HCL 5 MG/ML IV SOLN: 80.0000 mg | INTRAVENOUS | Status: DC | PRN

## 2017-12-09 MED ORDER — HYDROCHLOROTHIAZIDE 25 MG PO TABS
25.0000 mg | ORAL_TABLET | Freq: Every day | ORAL | Status: DC
  Administered 2017-12-09 – 2017-12-11 (×3): 25 mg via ORAL
  Filled 2017-12-09 (×3): qty 1

## 2017-12-09 MED ORDER — ZOLPIDEM TARTRATE 5 MG PO TABS
5.0000 mg | ORAL_TABLET | Freq: Every evening | ORAL | Status: DC | PRN
  Administered 2017-12-09: 5 mg via ORAL
  Filled 2017-12-09: qty 1

## 2017-12-09 MED ORDER — HYDROMORPHONE HCL 2 MG/ML IJ SOLN
2.0000 mg | Freq: Once | INTRAMUSCULAR | Status: AC
  Administered 2017-12-09: 2 mg via INTRAVENOUS
  Filled 2017-12-09: qty 1

## 2017-12-09 MED ORDER — LABETALOL HCL 5 MG/ML IV SOLN
20.0000 mg | INTRAVENOUS | Status: DC | PRN
  Administered 2017-12-09 – 2017-12-10 (×3): 20 mg via INTRAVENOUS

## 2017-12-09 NOTE — Lactation Note (Addendum)
This note was copied from a baby's chart. Lactation Consultation Note; Follow up visit with this mom of NICU baby. Mom pumping as I went into room. Obtained about 40 ml of whitish milk. Has not pumped since 10 pm. Mom reports she pumped 7 times yesterday. Encouraged to pump at least once during the night to prevent engorgement.  Plans to buy Medela pump for home. Praise given for her efforts. #30 flange given for right nipple- looks too tight in #27 flange and mom reports it is slightly tender. Reviewed she is ready to leave initiation phase on pump.  Suggested coconut oil on flanges for lubrication. Mom had a friend bring her Mother's Milk Tea and she had one cup last night and one this morning. Suggested she may not need this since milk supply is increasing.  No questions at present. To call for assist prn  Patient Name: Krystal [name] [id number] Date: 12/09/2017 Reason for consult: Follow-up assessment;Preterm <34wks;NICU baby;Primapara   Maternal Data Formula Feeding for Exclusion: Yes Reason for exclusion: Admission to Intensive Care Unit (ICU) post-partum Has patient been taught Hand Expression?: Yes Does the patient have breastfeeding experience prior to this delivery?: No  Feeding Feeding Type: Breast Milk  LATCH Score                   Interventions    Lactation Tools Discussed/Used WIC Program: No   Consult Status Consult Status: Follow-up Date: 12/10/17 Follow-up type: In-patient    Truddie Crumble 12/09/2017, 7:47 AM

## 2017-12-09 NOTE — Progress Notes (Signed)
Update given to Dr Elly Modena regarding pain and severe pressures.  RN to proceed with Labetalol protocol.

## 2017-12-09 NOTE — Progress Notes (Signed)
Due to acuity, and limited staffing, CSW is unable to meet with MOB to complete assessment for hx of PPD. CSW spoke with bedside nurse via telephone and PMAD education will be provided at discharge.  Bedside did not have any concerns or noted any additional psychosocial stressors.   CSW will complete clinical assessment with MOB when MOB's visits infant in NICU.    Please consult CSW if concerns arise or by MOB's request.   There are no barriers to discharge.   Laurey Arrow, MSW, LCSW Clinical Social Work [phone] [place] spoke with bedside nurse and PMAD education will be completed at discharge.  CSW will complete full clinical assessment

## 2017-12-09 NOTE — Progress Notes (Signed)
Subjective: Postpartum Day 2: Cesarean Delivery Patient reports feeling well and moving better. She reports incisional pain well controlled.    Objective: Vital signs in last 24 hours: Temp:  [97.8 F (36.6 C)-98.7 F (37.1 C)] 98.5 F (36.9 C) (10/16 0813) Pulse Rate:  [71-120] 71 (10/16 0813) Resp:  [17-18] 17 (10/16 0813) BP: (138-187)/(76-115) 150/96 (10/16 0813) SpO2:  [99 %-100 %] 100 % (10/16 0813)  Physical Exam:  General: alert, cooperative and no distress Lochia: appropriate Uterine Fundus: firm Incision: honeycomb dressing clean, dry and intact DVT Evaluation: No evidence of DVT seen on physical exam. No cords or calf tenderness.  Recent Labs    12/07/17 1053 12/08/17 0042  HGB 10.4* 10.5*  HCT 29.0* 30.3*    Assessment/Plan: Status post Cesarean section. Doing well postoperatively.  Continue current care Continue management of HTN with amlodipine Discharge planning tomorrow.  Onita Pfluger 12/09/2017, 10:36 AM

## 2017-12-10 MED ORDER — LABETALOL HCL 5 MG/ML IV SOLN: 20.0000 mg | INTRAVENOUS | Status: DC | PRN

## 2017-12-10 MED ORDER — HYDRALAZINE HCL 20 MG/ML IJ SOLN: 10.0000 mg | INTRAMUSCULAR | Status: DC | PRN

## 2017-12-10 MED ORDER — LABETALOL HCL 200 MG PO TABS
400.0000 mg | ORAL_TABLET | Freq: Three times a day (TID) | ORAL | Status: DC
  Administered 2017-12-10 – 2017-12-11 (×4): 400 mg via ORAL
  Filled 2017-12-10 (×4): qty 2

## 2017-12-10 MED ORDER — LABETALOL HCL 200 MG PO TABS
400.0000 mg | ORAL_TABLET | Freq: Two times a day (BID) | ORAL | Status: DC
  Administered 2017-12-10: 400 mg via ORAL
  Filled 2017-12-10: qty 2

## 2017-12-10 MED ORDER — HYDRALAZINE HCL 20 MG/ML IJ SOLN
5.0000 mg | INTRAMUSCULAR | Status: DC | PRN
  Administered 2017-12-11: 5 mg via INTRAVENOUS
  Filled 2017-12-10: qty 1

## 2017-12-10 MED ORDER — LABETALOL HCL 5 MG/ML IV SOLN: 40.0000 mg | INTRAVENOUS | Status: DC | PRN

## 2017-12-10 NOTE — Progress Notes (Signed)
Subjective: Postpartum Day 3: Cesarean Delivery Patient reports some incisional pain.    Objective: Vital signs in last 24 hours: Temp:  [98.2 F (36.8 C)-98.6 F (37 C)] 98.3 F (36.8 C) (10/17 0758) Pulse Rate:  [67-94] 71 (10/17 0941) Resp:  [17-20] 18 (10/17 0758) BP: (136-180)/(88-117) 176/115 (10/17 0941) SpO2:  [98 %-100 %] 100 % (10/17 0758)  Physical Exam:  General: alert, cooperative and no distress Lochia: appropriate Uterine Fundus: firm Incision: honeycomb dressing clean dry and intact DVT Evaluation: No evidence of DVT seen on physical exam. No cords or calf tenderness.  Recent Labs    12/07/17 1053 12/08/17 0042  HGB 10.4* 10.5*  HCT 29.0* 30.3*    Assessment/Plan: Status post Cesarean section. Doing well postoperatively.  Persistently elevated BP HCTZ added yesterday Labetalol started this morning Will continue to observe.  Evanee Lubrano 12/10/2017, 9:50 AM

## 2017-12-10 NOTE — Plan of Care (Signed)
Pts. Condition will continue to improve 

## 2017-12-10 NOTE — Lactation Note (Signed)
This note was copied from a baby's chart. Lactation Consultation Note: Mom reports she pumped 5 times yesterday,Has just pumped about 45 ml of EBM.  Has been having pain in her incision and back. For possible DC today. Has ordered Medela pump- should come tomorrow. Asking about manual pump to use over night. Reviewed how to use pump pieces as manual pump. Encouraged to bring pump pieces when visiting baby and use our pumping rooms, No questions at present. Reviewed our phone number to call with questions/concerns or when ready to put baby to the breast. Encouragement given.   Patient Name: Krystal [name] [id number] Date: 12/10/2017 Reason for consult: Follow-up assessment;Preterm <34wks;NICU baby   Maternal Data Formula Feeding for Exclusion: Yes Reason for exclusion: Admission to Intensive Care Unit (ICU) post-partum Has patient been taught Hand Expression?: Yes Does the patient have breastfeeding experience prior to this delivery?: No  Feeding    LATCH Score                   Interventions    Lactation Tools Discussed/Used WIC Program: No   Consult Status Consult Status: Complete    Truddie Crumble 12/10/2017, 9:03 AM

## 2017-12-11 MED ORDER — LABETALOL HCL 200 MG PO TABS: 400.0000 mg | ORAL_TABLET | Freq: Three times a day (TID) | ORAL | 3 refills | Status: DC

## 2017-12-11 MED ORDER — AMLODIPINE BESYLATE 10 MG PO TABS: 10.0000 mg | ORAL_TABLET | Freq: Every day | ORAL | 3 refills | Status: DC

## 2017-12-11 MED ORDER — OXYCODONE-ACETAMINOPHEN 5-325 MG PO TABS: 1.0000 | ORAL_TABLET | Freq: Four times a day (QID) | ORAL | 0 refills | Status: DC | PRN

## 2017-12-11 MED ORDER — HYDROCHLOROTHIAZIDE 25 MG PO TABS: 25.0000 mg | ORAL_TABLET | Freq: Every day | ORAL | 3 refills | Status: DC

## 2017-12-11 MED ORDER — IBUPROFEN 600 MG PO TABS: 600.0000 mg | ORAL_TABLET | Freq: Four times a day (QID) | ORAL | 0 refills | Status: DC | PRN

## 2017-12-11 NOTE — Progress Notes (Signed)
Pt discharged to home with husband.  Condition stable.  Pt ambulated to car with D. Lloyd-Gainey, NT.  No equipment for home ordered at discharge.

## 2017-12-11 NOTE — Discharge Summary (Signed)
OB Discharge Summary     Patient Name: Krystal [name] DOB: [birth year] MRN: [id number]  Date of admission: 12/06/2017 Delivering MD: Sloan Leiter   Date of discharge: 12/11/2017  Admitting diagnosis: 43 WKS, UPPER ABD PAIN, VOMTING Intrauterine pregnancy: [redacted]w[redacted]d     Secondary diagnosis:  Active Problems:   Severe hypertension affecting pregnancy in second trimester   HELLP syndrome   HELLP (hemolytic anemia/elev liver enzymes/low platelets in pregnancy)     Discharge diagnosis: Preterm delivery with [facility] and superimposed preeclampsia/HELLP                                                                                                 Hospital course:  Sceduled C/S   27 y.o. yo G1P0101 at [redacted]w[redacted]d was admitted to the hospital 12/06/2017 for scheduled cesarean section with the following indication:CHTN with superimposed severe preeclampsia and HELLP at [redacted]w[redacted]d Membrane Rupture Time/Date: 12:10 AM ,12/07/2017   Patient delivered a Viable infant.12/07/2017  Details of operation can be found in separate operative note.  Pateint had an uncomplicated postpartum course.  She is ambulating, tolerating a regular diet, passing flatus, and urinating well. Patient is discharged home in stable condition on  12/11/17         Physical exam  Vitals:   12/11/17 0004 12/11/17 0040 12/11/17 0449 12/11/17 0759  BP: (!) 164/107 (!) 159/93 (!) 159/103 (!) 134/93  Pulse: 78 92 89 68  Resp:   20 19  Temp:   97.7 F (36.5 C) 98.7 F (37.1 C)  TempSrc:    Oral  SpO2:   100% 98%  Weight:      Height:       General: alert, cooperative and no distress Lochia: appropriate Uterine Fundus: firm Incision: honeycomb dressing clean dry and intact DVT Evaluation: No evidence of DVT seen on physical exam. No cords or calf tenderness. Labs: Lab Results  Component Value Date   WBC 16.1 (H) 12/08/2017   HGB 10.5 (L) 12/08/2017   HCT 30.3 (L) 12/08/2017   MCV 94.4 12/08/2017   PLT 102 (L) 12/08/2017    CMP Latest Ref Rng & Units 12/08/2017  Glucose 70 - 99 mg/dL 121(H)  BUN 6 - 20 mg/dL 17  Creatinine 0.44 - 1.00 mg/dL 0.71  Sodium 135 - 145 mmol/L 136  Potassium 3.5 - 5.1 mmol/L 3.8  Chloride 98 - 111 mmol/L 103  CO2 22 - 32 mmol/L 24  Calcium 8.9 - 10.3 mg/dL 7.4(L)  Total Protein 6.5 - 8.1 g/dL 5.5(L)  Total Bilirubin 0.3 - 1.2 mg/dL 0.7  Alkaline Phos 38 - 126 U/L 51  AST 15 - 41 U/L 134(H)  ALT 0 - 44 U/L 172(H)    Discharge instruction: per After Visit Summary and "Baby and Me Booklet".  After visit meds:  Allergies as of 12/11/2017      Reactions   Red Dye Other (See Comments)   ALL CAUSE PROBLEMS #40 IS WORST-MIGRAINES    Compazine [prochlorperazine Maleate] Other (See Comments)   TWITCHING, CANT STAY STILL. Pt states she can tolerate promethazine   Other Other (  See Comments)   FANSIDAR FOR MALARIA-ASTHMA   Vicodin [hydrocodone-acetaminophen] Itching, Nausea And Vomiting   Pt states she can tolerate acetaminophen   Wheat Bran Other (See Comments)   GLUTEN SENSITIVE   Reglan [metoclopramide] Other (See Comments)   restless      Medication List    STOP taking these medications   doxylamine (Sleep) 25 MG tablet Commonly known as:  UNISOM   methyldopa 500 MG tablet Commonly known as:  ALDOMET     TAKE these medications   acetaminophen 500 MG tablet Commonly known as:  TYLENOL Take 1,000 mg by mouth every 8 (eight) hours as needed for mild pain or headache.   albuterol 108 (90 Base) MCG/ACT inhaler Commonly known as:  PROVENTIL HFA;VENTOLIN HFA Inhale 1 puff into the lungs every 6 (six) hours as needed for wheezing or shortness of breath.   amLODipine 10 MG tablet Commonly known as:  NORVASC Take 1 tablet (10 mg total) by mouth daily. Start taking on:  12/12/2017   cyclobenzaprine 10 MG tablet Commonly known as:  FLEXERIL Take 1 tablet (10 mg total) by mouth every 8 (eight) hours as needed for muscle spasms.   diphenhydrAMINE 25 mg  capsule Commonly known as:  BENADRYL Take 1 capsule (25 mg total) by mouth every 6 (six) hours as needed. What changed:    when to take this  reasons to take this   hydrochlorothiazide 25 MG tablet Commonly known as:  HYDRODIURIL Take 1 tablet (25 mg total) by mouth daily. Start taking on:  12/12/2017   ibuprofen 600 MG tablet Commonly known as:  ADVIL,MOTRIN Take 1 tablet (600 mg total) by mouth every 6 (six) hours as needed for headache, mild pain or cramping.   labetalol 200 MG tablet Commonly known as:  NORMODYNE Take 2 tablets (400 mg total) by mouth 3 (three) times daily. What changed:    medication strength  when to take this   omeprazole 20 MG capsule Commonly known as:  PRILOSEC Take 20 mg by mouth daily.   ondansetron 4 MG tablet Commonly known as:  ZOFRAN Take 4 mg by mouth every 8 (eight) hours as needed for nausea or vomiting.   oxyCODONE-acetaminophen 5-325 MG tablet Commonly known as:  PERCOCET/ROXICET Take 1-2 tablets by mouth every 6 (six) hours as needed.   PRENATAL PO Take 1 tablet by mouth daily.   promethazine 25 MG tablet Commonly known as:  PHENERGAN Take 1 tablet (25 mg total) by mouth every 6 (six) hours as needed for nausea or vomiting.       Diet: low salt diet  Activity: Advance as tolerated. Pelvic rest for 6 weeks.   Outpatient follow up: Follow up Appt: Future Appointments  Date Time Provider [place]  12/22/2017  1:30 PM [place] [facility]  01/04/2018  8:15 AM Sloan Leiter, MD [facility]   Follow up Visit:No follow-ups on file.  Postpartum contraception: Undecided  Newborn Data: Live born female  Birth Weight: 1 lb 10.8 oz (760 g) APGAR: 3, 4  Newborn Delivery   Birth date/time:  12/07/2017 00:11:00 Delivery type:  C-Section, Low Transverse Trial of labor:  No C-section categorization:  Primary     Baby Feeding: Bottle and Breast Disposition:NICU   12/11/2017 Mora Bellman,  MD

## 2017-12-11 NOTE — Lactation Note (Signed)
This note was copied from a baby's chart. Lactation Consultation Note  Patient Name: Krystal [name] Today's Date: 12/11/2017   Mom's milk is in & she is able to pump about 2 oz. She is using a size 27 flange for her L breast & a size 30 flange for her R. Mom has been pumping only 6 times/day. I suggested that she pump 8-12 times/day for the 1st 2 weeks to maximize her potential supply. Mom inquired about how to get more milk out & I pulled up Dr. Opal Sidles Morton's video about Hands-on Pumping. I also went over the CDC's handout, "How to Keep your Breast Pump Kit Clean." Mom verbalized understanding & knows that she can continue to ask for lactation help even though she's getting discharged. Mom's Medela pump should arrive in the mail today. Mom knows how to assemble & use hand pump (double- & single-mode) that was included in pump kit.   Mom's discharge meds include: amlodipine (L3) (per Marcello Moores Hale's "Medications & Mother's Milk"  2019, "the use of this product should not deter a woman from breastfeeding if this medication is required"; HCTZ 106m (L2); labetalol 4057mtid (L2); albuterol inhaler prn (L1). Mother's Milk Tea was noted at bedside, which includes fenugreek. Mom made aware that asthmatics should be careful with fenugreek, but Mom reports that she has not needed to use her inhaler in 5 months. Mom also has a Rx for cyclobenzaprine 1033m8hrs prn. Mom does not anticipate needing to take this often. I suggested that she let the NICU RNs know that her EBM may contain cyclobenzaprine when she has taken it.   RicMatthias HughsmEast [facility]/18/2019, 11:07 AM

## 2017-12-11 NOTE — Discharge Instructions (Signed)
Preeclampsia and Eclampsia °Preeclampsia is a serious condition that develops only during pregnancy. It is also called toxemia of pregnancy. This condition causes high blood pressure along with other symptoms, such as swelling and headaches. These symptoms may develop as the condition gets worse. Preeclampsia may occur at 20 weeks of pregnancy or later. °Diagnosing and treating preeclampsia early is very important. If not treated early, it can cause serious problems for you and your baby. One problem it can lead to is eclampsia, which is a condition that causes muscle jerking or shaking (convulsions or seizures) in the mother. Delivering your baby is the best treatment for preeclampsia or eclampsia. Preeclampsia and eclampsia symptoms usually go away after your baby is born. °What are the causes? °The cause of preeclampsia is not known. °What increases the risk? °The following risk factors make you more likely to develop preeclampsia: °· Being pregnant for the first time. °· Having had preeclampsia during a past pregnancy. °· Having a family history of preeclampsia. °· Having high blood pressure. °· Being pregnant with twins or triplets. °· Being 35 or older. °· Being African-American. °· Having kidney disease or diabetes. °· Having medical conditions such as lupus or blood diseases. °· Being very overweight (obese). ° °What are the signs or symptoms? °The earliest signs of preeclampsia are: °· High blood pressure. °· Increased protein in your urine. Your health care provider will check for this at every visit before you give birth (prenatal visit). ° °Other symptoms that may develop as the condition gets worse include: °· Severe headaches. °· Sudden weight gain. °· Swelling of the hands, face, legs, and feet. °· Nausea and vomiting. °· Vision problems, such as blurred or double vision. °· Numbness in the face, arms, legs, and feet. °· Urinating less than usual. °· Dizziness. °· Slurred speech. °· Abdominal pain,  especially upper abdominal pain. °· Convulsions or seizures. ° °Symptoms generally go away after giving birth. °How is this diagnosed? °There are no screening tests for preeclampsia. Your health care provider will ask you about symptoms and check for signs of preeclampsia during your prenatal visits. You may also have tests that include: °· Urine tests. °· Blood tests. °· Checking your blood pressure. °· Monitoring your baby’s heart rate. °· Ultrasound. ° °How is this treated? °You and your health care provider will determine the treatment approach that is best for you. Treatment may include: °· Having more frequent prenatal exams to check for signs of preeclampsia, if you have an increased risk for preeclampsia. °· Bed rest. °· Reducing how much salt (sodium) you eat. °· Medicine to lower your blood pressure. °· Staying in the hospital, if your condition is severe. There, treatment will focus on controlling your blood pressure and the amount of fluids in your body (fluid retention). °· You may need to take medicine (magnesium sulfate) to prevent seizures. This medicine may be given as an injection or through an IV tube. °· Delivering your baby early, if your condition gets worse. You may have your labor started with medicine (induced), or you may have a cesarean delivery. ° °Follow these instructions at home: °Eating and drinking ° °· Drink enough fluid to keep your urine clear or pale yellow. °· Eat a healthy diet that is low in sodium. Do not add salt to your food. Check nutrition labels to see how much sodium a food or beverage contains. °· Avoid caffeine. °Lifestyle °· Do not use any products that contain nicotine or tobacco, such as cigarettes   and e-cigarettes. If you need help quitting, ask your health care provider.  Do not use alcohol or drugs.  Avoid stress as much as possible. Rest and get plenty of sleep. General instructions  Take over-the-counter and prescription medicines only as told by your  health care provider.  When lying down, lie on your side. This keeps pressure off of your baby.  When sitting or lying down, raise (elevate) your feet. Try putting some pillows underneath your lower legs.  Exercise regularly. Ask your health care provider what kinds of exercise are best for you.  Keep all follow-up and prenatal visits as told by your health care provider. This is important. How is this prevented? To prevent preeclampsia or eclampsia from developing during another pregnancy:  Get proper medical care during pregnancy. Your health care provider may be able to prevent preeclampsia or diagnose and treat it early.  Your health care provider may have you take a low-dose aspirin or a calcium supplement during your next pregnancy.  You may have tests of your blood pressure and kidney function after giving birth.  Maintain a healthy weight. Ask your health care provider for help managing weight gain during pregnancy.  Work with your health care provider to manage any long-term (chronic) health conditions you have, such as diabetes or kidney problems.  Contact a health care provider if:  You gain more weight than expected.  You have headaches.  You have nausea or vomiting.  You have abdominal pain.  You feel dizzy or light-headed. Get help right away if:  You develop sudden or severe swelling anywhere in your body. This usually happens in the legs.  You gain 5 lbs (2.3 kg) or more during one week.  You have severe: ? Abdominal pain. ? Headaches. ? Dizziness. ? Vision problems. ? Confusion. ? Nausea or vomiting.  You have a seizure.  You have trouble moving any part of your body.  You develop numbness in any part of your body.  You have trouble speaking.  You have any abnormal bleeding.  You pass out. This information is not intended to replace advice given to you by your health care provider. Make sure you discuss any questions you have with your health  care provider. Document Released: 02/08/2000 Document Revised: 10/09/2015 Document Reviewed: 09/17/2015 Elsevier Interactive Patient Education  2018 [employer].  Cesarean Delivery, Care After Refer to this sheet in the next few weeks. These instructions provide you with information about caring for yourself after your procedure. Your health care provider may also give you more specific instructions. Your treatment has been planned according to current medical practices, but problems sometimes occur. Call your health care provider if you have any problems or questions after your procedure. What can I expect after the procedure? After the procedure, it is common to have:  A small amount of blood or clear fluid coming from the incision.  Some redness, swelling, and pain in your incision area.  Some abdominal pain and soreness.  Vaginal bleeding (lochia).  Pelvic cramps.  Fatigue.  Follow these instructions at home: Incision care   Follow instructions from your health care provider about how to take care of your incision. Make sure you: ? Wash your hands with soap and water before you change your bandage (dressing). If soap and water are not available, use hand sanitizer. ? If you have a dressing, change it as told by your health care provider. ? Leave stitches (sutures), skin staples, skin glue, or adhesive strips  in place. These skin closures may need to stay in place for 2 weeks or longer. If adhesive strip edges start to loosen and curl up, you may trim the loose edges. Do not remove adhesive strips completely unless your health care provider tells you to do that.  Check your incision area every day for signs of infection. Check for: ? More redness, swelling, or pain. ? More fluid or blood. ? Warmth. ? Pus or a bad smell.  When you cough or sneeze, hug a pillow. This helps with pain and decreases the chance of your incision opening up (dehiscing). Do this until your incision  heals. Medicines  Take over-the-counter and prescription medicines only as told by your health care provider.  If you were prescribed an antibiotic medicine, take it as told by your health care provider. Do not stop taking the antibiotic until it is finished. Driving  Do not drive or operate heavy machinery while taking prescription pain medicine. Lifestyle  Do not drink alcohol. This is especially important if you are breastfeeding or taking pain medicine.  Do not use tobacco products, including cigarettes, chewing tobacco, or e-cigarettes. If you need help quitting, ask your health care provider. Tobacco can delay wound healing. Eating and drinking  Drink at least 8 eight-ounce glasses of water every day unless told not to by your health care provider. If you breastfeed, you may need to drink more water than this.  Eat high-fiber foods every day. These foods may help prevent or relieve constipation. High-fiber foods include: ? Whole grain cereals and breads. ? Brown rice. ? Beans. ? Fresh fruits and vegetables. Activity  Return to your normal activities as told by your health care provider. Ask your health care provider what activities are safe for you.  Rest as much as possible. Try to rest or take a nap while your baby is sleeping.  Do not lift anything that is heavier than your baby or 10 lb (4.5 kg) as told by your health care provider.  Ask your health care provider when you can engage in sexual activity. This may depend on your: ? Risk of infection. ? Healing rate. ? Comfort and desire to engage in sexual activity. Bathing  Do not take baths, swim, or use a hot tub until your health care provider approves. Ask your health care provider if you can take showers. You may only be allowed to take sponge baths until your incision heals. General instructions  Do not use tampons or douches until your health care provider approves.  Wear: ? Loose, comfortable clothing. ? A  supportive and well-fitting bra.  Watch for any blood clots that may pass from your vagina. These may look like clumps of dark red, brown, or black discharge.  Keep your perineum clean and dry as told by your health care provider.  Wipe from front to back when you use the toilet.  If possible, have someone help you care for your baby and help with household activities for a few days after you leave the hospital.  Keep all follow-up visits for you and your baby as told by your health care provider. This is important. Contact a health care provider if:  You have: ? Bad-smelling vaginal discharge. ? Difficulty urinating. ? Pain when urinating. ? A sudden increase or decrease in the frequency of your bowel movements. ? More redness, swelling, or pain around your incision. ? More fluid or blood coming from your incision. ? Pus or a bad smell coming  from your incision. ? A fever. ? A rash. ? Little or no interest in activities you used to enjoy. ? Questions about caring for yourself or your baby. ? Nausea.  Your incision feels warm to the touch.  Your breasts turn red or become painful or hard.  You feel unusually sad or worried.  You vomit.  You pass large blood clots from your vagina. If you pass a blood clot, save it to show to your health care provider. Do not flush blood clots down the toilet without showing your health care provider.  You urinate more than usual.  You are dizzy or light-headed.  You have not breastfed and have not had a menstrual period for 12 weeks after delivery.  You stopped breastfeeding and have not had a menstrual period for 12 weeks after stopping breastfeeding. Get help right away if:  You have: ? Pain that does not go away or get better with medicine. ? Chest pain. ? Difficulty breathing. ? Blurred vision or spots in your vision. ? Thoughts about hurting yourself or your baby. ? New pain in your abdomen or in one of your legs. ? A severe  headache.  You faint.  You bleed from your vagina so much that you fill two sanitary pads in one hour. This information is not intended to replace advice given to you by your health care provider. Make sure you discuss any questions you have with your health care provider. Document Released: 11/02/2001 Document Revised: 03/15/2016 Document Reviewed: 01/15/2015 Elsevier Interactive Patient Education  [employer].

## 2017-12-14 ENCOUNTER — Other Ambulatory Visit: Payer: Self-pay | Admitting: Obstetrics and Gynecology

## 2017-12-18 ENCOUNTER — Other Ambulatory Visit: Payer: Self-pay

## 2017-12-18 ENCOUNTER — Encounter (HOSPITAL_COMMUNITY): Payer: Self-pay | Admitting: Student

## 2017-12-18 ENCOUNTER — Inpatient Hospital Stay (HOSPITAL_COMMUNITY)
Admission: AD | Admit: 2017-12-18 | Discharge: 2017-12-18 | Disposition: A | Discharge status: Home / Self Care | Payer: Medicaid Other | Source: Ambulatory Visit | Attending: Obstetrics and Gynecology | Admitting: Obstetrics and Gynecology

## 2017-12-18 DIAGNOSIS — Z87891 Personal history of nicotine dependence: Secondary | ICD-10-CM | POA: Diagnosis not present

## 2017-12-18 DIAGNOSIS — I10 Essential (primary) hypertension: Secondary | ICD-10-CM

## 2017-12-18 DIAGNOSIS — R112 Nausea with vomiting, unspecified: Secondary | ICD-10-CM | POA: Diagnosis not present

## 2017-12-18 DIAGNOSIS — G8918 Other acute postprocedural pain: Secondary | ICD-10-CM | POA: Diagnosis not present

## 2017-12-18 DIAGNOSIS — K5903 Drug induced constipation: Secondary | ICD-10-CM | POA: Diagnosis not present

## 2017-12-18 DIAGNOSIS — Z98891 History of uterine scar from previous surgery: Secondary | ICD-10-CM

## 2017-12-18 DIAGNOSIS — R109 Unspecified abdominal pain: Secondary | ICD-10-CM | POA: Diagnosis present

## 2017-12-18 DIAGNOSIS — O9089 Other complications of the puerperium, not elsewhere classified: Secondary | ICD-10-CM | POA: Insufficient documentation

## 2017-12-18 LAB — COMPREHENSIVE METABOLIC PANEL
ALT: 31 U/L (ref 0–44)
AST: 32 U/L (ref 15–41)
Albumin: 3.9 g/dL (ref 3.5–5.0)
Alkaline Phosphatase: 56 U/L (ref 38–126)
Anion gap: 12 (ref 5–15)
BUN: 12 mg/dL (ref 6–20)
CO2: 25 mmol/L (ref 22–32)
Calcium: 9.4 mg/dL (ref 8.9–10.3)
Chloride: 104 mmol/L (ref 98–111)
Creatinine, Ser: 0.71 mg/dL (ref 0.44–1.00)
GFR calc Af Amer: >60 mL/min (ref >60)
GFR calc non Af Amer: >60 mL/min (ref >60)
Glucose, Bld: 91 mg/dL (ref 70–99)
Potassium: 4 mmol/L (ref 3.5–5.1)
Sodium: 141 mmol/L (ref 135–145)
Total Bilirubin: 0.5 mg/dL (ref 0.3–1.2)
Total Protein: 6.5 g/dL (ref 6.5–8.1)

## 2017-12-18 LAB — CBC WITH DIFFERENTIAL/PLATELET
Basophils Absolute: 0.1 10*3/uL (ref 0.0–0.1)
Basophils Relative: 1 %
Eosinophils Absolute: 0.3 10*3/uL (ref 0.0–0.5)
Eosinophils Relative: 4 %
HCT: 32.7 % — ABNORMAL LOW (ref 36.0–46.0)
Hemoglobin: 11.5 g/dL — ABNORMAL LOW (ref 12.0–15.0)
Lymphocytes Relative: 29 %
Lymphs Abs: 2.1 10*3/uL (ref 0.7–4.0)
MCH: 32.7 pg (ref 26.0–34.0)
MCHC: 35.2 g/dL (ref 30.0–36.0)
MCV: 92.9 fL (ref 80.0–100.0)
Monocytes Absolute: 0.6 10*3/uL (ref 0.1–1.0)
Monocytes Relative: 9 %
Neutro Abs: 4 10*3/uL (ref 1.7–7.7)
Neutrophils Relative %: 57 %
Other: 0 %
Platelets: 338 10*3/uL (ref 150–400)
RBC: 3.52 MIL/uL — ABNORMAL LOW (ref 3.87–5.11)
RDW: 12.7 % (ref 11.5–15.5)
WBC: 7.1 10*3/uL (ref 4.0–10.5)
nRBC: 0 % (ref 0.0–0.2)

## 2017-12-18 LAB — URINALYSIS, ROUTINE W REFLEX MICROSCOPIC
Bilirubin Urine: NEGATIVE
Glucose, UA: NEGATIVE mg/dL
Hgb urine dipstick: NEGATIVE
Ketones, ur: NEGATIVE mg/dL
Leukocytes, UA: NEGATIVE
Nitrite: NEGATIVE
Protein, ur: NEGATIVE mg/dL
Specific Gravity, Urine: 1.005 (ref 1.005–1.030)
pH: 7 (ref 5.0–8.0)

## 2017-12-18 MED ORDER — KETOROLAC TROMETHAMINE 10 MG PO TABS: 10.0000 mg | ORAL_TABLET | Freq: Four times a day (QID) | ORAL | 0 refills | Status: DC | PRN

## 2017-12-18 MED ORDER — PROMETHAZINE HCL 25 MG/ML IJ SOLN
25.0000 mg | Freq: Once | INTRAVENOUS | Status: AC
  Administered 2017-12-18: 25 mg via INTRAVENOUS
  Filled 2017-12-18: qty 1

## 2017-12-18 MED ORDER — OXYCODONE-ACETAMINOPHEN 5-325 MG PO TABS: 1.0000 | ORAL_TABLET | Freq: Four times a day (QID) | ORAL | 0 refills | Status: DC | PRN

## 2017-12-18 MED ORDER — OXYCODONE-ACETAMINOPHEN 5-325 MG PO TABS
1.0000 | ORAL_TABLET | Freq: Once | ORAL | Status: AC
  Administered 2017-12-18: 1 via ORAL
  Filled 2017-12-18: qty 1

## 2017-12-18 MED ORDER — POLYETHYLENE GLYCOL 3350 17 GM/SCOOP PO POWD: 17.0000 g | Freq: Every day | ORAL | 2 refills | Status: DC | PRN

## 2017-12-18 MED ORDER — LABETALOL HCL 100 MG PO TABS
200.0000 mg | ORAL_TABLET | Freq: Once | ORAL | Status: AC
  Administered 2017-12-18: 200 mg via ORAL
  Filled 2017-12-18: qty 2

## 2017-12-18 MED ORDER — FENTANYL CITRATE (PF) 100 MCG/2ML IJ SOLN
100.0000 ug | INTRAMUSCULAR | Status: DC | PRN
  Administered 2017-12-18: 100 ug via INTRAVENOUS
  Filled 2017-12-18: qty 2

## 2017-12-18 MED ORDER — LACTATED RINGERS IV BOLUS
1000.0000 mL | Freq: Once | INTRAVENOUS | Status: AC
  Administered 2017-12-18: 1000 mL via INTRAVENOUS

## 2017-12-18 MED ORDER — KETOROLAC TROMETHAMINE 30 MG/ML IJ SOLN
30.0000 mg | Freq: Once | INTRAMUSCULAR | Status: AC
  Administered 2017-12-18: 30 mg via INTRAVENOUS
  Filled 2017-12-18: qty 1

## 2017-12-18 NOTE — Discharge Instructions (Signed)
Cesarean Delivery, Care After Refer to this sheet in the next few weeks. These instructions provide you with information about caring for yourself after your procedure. Your health care provider may also give you more specific instructions. Your treatment has been planned according to current medical practices, but problems sometimes occur. Call your health care provider if you have any problems or questions after your procedure. What can I expect after the procedure? After the procedure, it is common to have:  A small amount of blood or clear fluid coming from the incision.  Some redness, swelling, and pain in your incision area.  Some abdominal pain and soreness.  Vaginal bleeding (lochia).  Pelvic cramps.  Fatigue.  Follow these instructions at home: Incision care   Follow instructions from your health care provider about how to take care of your incision. Make sure you: ? Wash your hands with soap and water before you change your bandage (dressing). If soap and water are not available, use hand sanitizer. ? If you have a dressing, change it as told by your health care provider. ? Leave stitches (sutures), skin staples, skin glue, or adhesive strips in place. These skin closures may need to stay in place for 2 weeks or longer. If adhesive strip edges start to loosen and curl up, you may trim the loose edges. Do not remove adhesive strips completely unless your health care provider tells you to do that.  Check your incision area every day for signs of infection. Check for: ? More redness, swelling, or pain. ? More fluid or blood. ? Warmth. ? Pus or a bad smell.  When you cough or sneeze, hug a pillow. This helps with pain and decreases the chance of your incision opening up (dehiscing). Do this until your incision heals. Medicines  Take over-the-counter and prescription medicines only as told by your health care provider.  If you were prescribed an antibiotic medicine, take it  as told by your health care provider. Do not stop taking the antibiotic until it is finished. Driving  Do not drive or operate heavy machinery while taking prescription pain medicine. Lifestyle  Do not drink alcohol. This is especially important if you are breastfeeding or taking pain medicine.  Do not use tobacco products, including cigarettes, chewing tobacco, or e-cigarettes. If you need help quitting, ask your health care provider. Tobacco can delay wound healing. Eating and drinking  Drink at least 8 eight-ounce glasses of water every day unless told not to by your health care provider. If you breastfeed, you may need to drink more water than this.  Eat high-fiber foods every day. These foods may help prevent or relieve constipation. High-fiber foods include: ? Whole grain cereals and breads. ? Brown rice. ? Beans. ? Fresh fruits and vegetables. Activity  Return to your normal activities as told by your health care provider. Ask your health care provider what activities are safe for you.  Rest as much as possible. Try to rest or take a nap while your baby is sleeping.  Do not lift anything that is heavier than your baby or 10 lb (4.5 kg) as told by your health care provider.  Ask your health care provider when you can engage in sexual activity. This may depend on your: ? Risk of infection. ? Healing rate. ? Comfort and desire to engage in sexual activity. Bathing  Do not take baths, swim, or use a hot tub until your health care provider approves. Ask your health care provider if  you can take showers. You may only be allowed to take sponge baths until your incision heals. General instructions  Do not use tampons or douches until your health care provider approves.  Wear: ? Loose, comfortable clothing. ? A supportive and well-fitting bra.  Watch for any blood clots that may pass from your vagina. These may look like clumps of dark red, brown, or black discharge.  Keep  your perineum clean and dry as told by your health care provider.  Wipe from front to back when you use the toilet.  If possible, have someone help you care for your baby and help with household activities for a few days after you leave the hospital.  Keep all follow-up visits for you and your baby as told by your health care provider. This is important. Contact a health care provider if:  You have: ? Bad-smelling vaginal discharge. ? Difficulty urinating. ? Pain when urinating. ? A sudden increase or decrease in the frequency of your bowel movements. ? More redness, swelling, or pain around your incision. ? More fluid or blood coming from your incision. ? Pus or a bad smell coming from your incision. ? A fever. ? A rash. ? Little or no interest in activities you used to enjoy. ? Questions about caring for yourself or your baby. ? Nausea.  Your incision feels warm to the touch.  Your breasts turn red or become painful or hard.  You feel unusually sad or worried.  You vomit.  You pass large blood clots from your vagina. If you pass a blood clot, save it to show to your health care provider. Do not flush blood clots down the toilet without showing your health care provider.  You urinate more than usual.  You are dizzy or light-headed.  You have not breastfed and have not had a menstrual period for 12 weeks after delivery.  You stopped breastfeeding and have not had a menstrual period for 12 weeks after stopping breastfeeding. Get help right away if:  You have: ? Pain that does not go away or get better with medicine. ? Chest pain. ? Difficulty breathing. ? Blurred vision or spots in your vision. ? Thoughts about hurting yourself or your baby. ? New pain in your abdomen or in one of your legs. ? A severe headache.  You faint.  You bleed from your vagina so much that you fill two sanitary pads in one hour. This information is not intended to replace advice given to  you by your health care provider. Make sure you discuss any questions you have with your health care provider. Document Released: 11/02/2001 Document Revised: 03/15/2016 Document Reviewed: 01/15/2015 Elsevier Interactive Patient Education  2018 [employer].   Constipation, Adult Constipation is when a person has fewer bowel movements in a week than normal, has difficulty having a bowel movement, or has stools that are dry, hard, or larger than normal. Constipation may be caused by an underlying condition. It may become worse with age if a person takes certain medicines and does not take in enough fluids. Follow these instructions at home: Eating and drinking   Eat foods that have a lot of fiber, such as fresh fruits and vegetables, whole grains, and beans.  Limit foods that are high in fat, low in fiber, or overly processed, such as french fries, hamburgers, cookies, candies, and soda.  Drink enough fluid to keep your urine clear or pale yellow. General instructions  Exercise regularly or as told by your  health care provider.  Go to the restroom when you have the urge to go. Do not hold it in.  Take over-the-counter and prescription medicines only as told by your health care provider. These include any fiber supplements.  Practice pelvic floor retraining exercises, such as deep breathing while relaxing the lower abdomen and pelvic floor relaxation during bowel movements.  Watch your condition for any changes.  Keep all follow-up visits as told by your health care provider. This is important. Contact a health care provider if:  You have pain that gets worse.  You have a fever.  You do not have a bowel movement after 4 days.  You vomit.  You are not hungry.  You lose weight.  You are bleeding from the anus.  You have thin, pencil-like stools. Get help right away if:  You have a fever and your symptoms suddenly get worse.  You leak stool or have blood in your  stool.  Your abdomen is bloated.  You have severe pain in your abdomen.  You feel dizzy or you faint. This information is not intended to replace advice given to you by your health care provider. Make sure you discuss any questions you have with your health care provider. Document Released: 11/09/2003 Document Revised: 08/31/2015 Document Reviewed: 08/01/2015 Elsevier Interactive Patient Education  2018 [employer].

## 2017-12-18 NOTE — MAU Provider Note (Addendum)
Chief Complaint: Incision Pain and Emesis   First Provider Initiated Contact with Patient 12/18/17 1629     SUBJECTIVE HPI: Krystal [name] is a 27 y.o. G1P0101 at Unknown who presents to Maternity Admissions reporting abdominal pain. 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  States has been taking percocet & ibuprofen for pain. Took last percocet this morning. States ibuprofen is not effective.  Reports vomiting 10x in the last 24 hours. States she believes the vomiting is d/t pain. Denies nausea. No diarrhea.  Denies headache, visual disturbance, epigastric pain, abnormal discharge, dysuria, fever/chills. Has been having normal BMs since surgery; last BM was yesterday. Has CHTN. Is currently taking labetalol, norvasc, & HCTZ. Has not missed any doses.   Location: incisional & periumbilical Quality: sharp, stabbing Severity: 8/10 on pain scale Duration: 2 weeks, worsened yesterday Timing: constant Modifying factors: Pain worse with walking & moving. Percocet not as effective anymore. No improvement with ibuprofen.  Associated signs and symptoms: vomiting  Past Medical History:  Diagnosis Date  . Anxiety   . Asthma   . Hypertension   . IC (interstitial cystitis)   . Kidney stones   . Migraines   . UTI (urinary tract infection)    OB History  Gravida Para Term Preterm AB Living  1 1   1   1   SAB TAB Ectopic Multiple Live Births        0 1    # Outcome Date GA Lbr Len/2nd Weight Sex Delivery Anes PTL Lv  1 Preterm 12/07/17 [redacted]w[redacted]d  760 g F CS-LTranv Gen  LIV     Birth Comments: ELBW   Past Surgical History:  Procedure Laterality Date  . APPENDECTOMY    . CESAREAN SECTION N/A 12/06/2017   Procedure: CESAREAN SECTION;  Surgeon: Sloan Leiter, MD;  Location: [facility];  Service: Obstetrics;  Laterality: N/A;  . 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 HAND RECONSTRUCTION Right   . INCISION AND DRAINAGE    . NASAL SEPTUM SURGERY     Social History    Socioeconomic History  . Marital status: Married    Spouse name: Not on file  . Number of children: Not on file  . Years of education: Not on file  . Highest education level: Not on file  Occupational History  . Not on file  Social Needs  . Financial resource strain: Not on file  . Food insecurity:    Worry: Not on file    Inability: Not on file  . Transportation needs:    Medical: Not on file    Non-medical: Not on file  Tobacco Use  . Smoking status: Former Research scientist (life sciences)  . Smokeless tobacco: Never Used  Substance and Sexual Activity  . Alcohol use: Not Currently    Comment: bottle of wine a week  . Drug use: No  . Sexual activity: Not on file  Lifestyle  . Physical activity:    Days per week: Not on file    Minutes per session: Not on file  . Stress: Not on file  Relationships  . Social connections:    Talks on phone: Not on file    Gets together: Not on file    Attends religious service: Not on file    Active member of club or organization: Not on file    Attends meetings of clubs or organizations: Not on file    Relationship status: Not on file  .  Intimate partner violence:    Fear of current or ex partner: Not on file    Emotionally abused: Not on file    Physically abused: Not on file    Forced sexual activity: Not on file  Other Topics Concern  . Not on file  Social History Narrative  . Not on file   Family History  Problem Relation Age of Onset  . Hypertension Mother   . Hypertension Father    No current facility-administered medications on file prior to encounter.    Current Outpatient Medications on File Prior to Encounter  Medication Sig Dispense Refill  . amLODipine (NORVASC) 10 MG tablet Take 1 tablet (10 mg total) by mouth daily. 30 tablet 3  . cetirizine (ZYRTEC) 10 MG tablet Take 10 mg by mouth daily.    . hydrochlorothiazide (HYDRODIURIL) 25 MG tablet Take 1 tablet (25 mg total) by mouth daily. 30 tablet 3  . ibuprofen (ADVIL,MOTRIN) 600 MG  tablet TAKE ONE TABLET BY MOUTH EVERY 6 HOURS AS NEEDED FOR HEADACHE, MILD PAIN OR CRAMPING 30 tablet 0  . labetalol (NORMODYNE) 200 MG tablet Take 2 tablets (400 mg total) by mouth 3 (three) times daily. 90 tablet 3  . omeprazole (PRILOSEC) 20 MG capsule Take 20 mg by mouth daily.    . ondansetron (ZOFRAN) 4 MG tablet Take 4 mg by mouth every 8 (eight) hours as needed for nausea or vomiting.    Marland Kitchen oxyCODONE-acetaminophen (PERCOCET/ROXICET) 5-325 MG tablet Take 1-2 tablets by mouth every 6 (six) hours as needed. 20 tablet 0  . Prenatal Vit-Fe Fumarate-FA (PRENATAL PO) Take 1 tablet by mouth daily.    Marland Kitchen acetaminophen (TYLENOL) 500 MG tablet Take 1,000 mg by mouth every 8 (eight) hours as needed for mild pain or headache.    . albuterol (PROVENTIL HFA;VENTOLIN HFA) 108 (90 Base) MCG/ACT inhaler Inhale 1 puff into the lungs every 6 (six) hours as needed for wheezing or shortness of breath.    . cyclobenzaprine (FLEXERIL) 10 MG tablet Take 1 tablet (10 mg total) by mouth every 8 (eight) hours as needed for muscle spasms. 30 tablet 1  . diphenhydrAMINE (BENADRYL) 25 mg capsule Take 1 capsule (25 mg total) by mouth every 6 (six) hours as needed. (Patient taking differently: Take 25 mg by mouth daily as needed for allergies. ) 30 capsule 0  . promethazine (PHENERGAN) 25 MG tablet Take 1 tablet (25 mg total) by mouth every 6 (six) hours as needed for nausea or vomiting. 30 tablet 0   Allergies  Allergen Reactions  . Red Dye Other (See Comments)    ALL CAUSE PROBLEMS #40 IS WORST-MIGRAINES   . Compazine [Prochlorperazine Maleate] Other (See Comments)    TWITCHING, CANT STAY STILL. Pt states she can tolerate promethazine  . Other Other (See Comments)    FANSIDAR FOR MALARIA-ASTHMA  . Vicodin [Hydrocodone-Acetaminophen] Itching and Nausea And Vomiting    Pt states she can tolerate acetaminophen  . Wheat Bran Other (See Comments)    GLUTEN SENSITIVE  . Reglan [Metoclopramide] Other (See Comments)     restless    I have reviewed patient's Past Medical Hx, Surgical Hx, Family Hx, Social Hx, medications and allergies.   Review of Systems  Constitutional: Negative for chills and fever.  Eyes: Negative for visual disturbance.  Gastrointestinal: Positive for abdominal pain and vomiting. Negative for constipation, diarrhea and nausea.  Genitourinary: Negative.   Neurological: Negative for headaches.    OBJECTIVE Patient Vitals for the past 24 hrs:  BP Temp Temp src Pulse Resp SpO2 Height Weight  12/18/17 1920 (!) 159/96 - - 77 - - - -  12/18/17 1631 (!) 142/99 - - 80 - - - -  12/18/17 1616 (!) 152/91 - - 74 - - - -  12/18/17 1601 (!) 153/90 - - 67 - - - -  12/18/17 1546 (!) 145/88 - - 61 - - - -  12/18/17 1536 (!) 156/111 97.9 F (36.6 C) Oral 72 18 - - -  12/18/17 1535 - - - - - 100 % - -  12/18/17 1522 - - - - - - 5\' 11"  (1.803 m) 99.6 kg   Constitutional: Well-developed, well-nourished female in no acute distress. Cardiovascular: normal rate & rhythm, no murmur Respiratory: normal rate and effort. Lung sounds clear throughout GI: Some tenderness throughout abdomen. Abd soft, Pos BS x 4. No guarding or rebound tenderness. Incision well healed & approximated.  MS: Extremities nontender, no edema, normal ROM Neurologic: Alert and oriented x 4.     LAB RESULTS Results for orders placed or performed during the hospital encounter of 12/18/17 (from the past 24 hour(s))  Urinalysis, Routine w reflex microscopic     Status: None   Collection Time: 12/18/17  3:26 PM  Result Value Ref Range   Color, Urine YELLOW YELLOW   APPearance CLEAR CLEAR   Specific Gravity, Urine 1.005 1.005 - 1.030   pH 7.0 5.0 - 8.0   Glucose, UA NEGATIVE NEGATIVE mg/dL   Hgb urine dipstick NEGATIVE NEGATIVE   Bilirubin Urine NEGATIVE NEGATIVE   Ketones, ur NEGATIVE NEGATIVE mg/dL   Protein, ur NEGATIVE NEGATIVE mg/dL   Nitrite NEGATIVE NEGATIVE   Leukocytes, UA NEGATIVE NEGATIVE  CBC with  Differential/Platelet     Status: Abnormal   Collection Time: 12/18/17  6:44 PM  Result Value Ref Range   WBC 7.1 4.0 - 10.5 K/uL   RBC 3.52 (L) 3.87 - 5.11 MIL/uL   Hemoglobin 11.5 (L) 12.0 - 15.0 g/dL   HCT 32.7 (L) 36.0 - 46.0 %   MCV 92.9 80.0 - 100.0 fL   MCH 32.7 26.0 - 34.0 pg   MCHC 35.2 30.0 - 36.0 g/dL   RDW 12.7 11.5 - 15.5 %   Platelets 338 150 - 400 K/uL   nRBC 0.0 0.0 - 0.2 %   Neutrophils Relative % 57 %   Lymphocytes Relative 29 %   Monocytes Relative 9 %   Eosinophils Relative 4 %   Basophils Relative 1 %   Other 0 %   Neutro Abs 4.0 1.7 - 7.7 K/uL   Lymphs Abs 2.1 0.7 - 4.0 K/uL   Monocytes Absolute 0.6 0.1 - 1.0 K/uL   Eosinophils Absolute 0.3 0.0 - 0.5 K/uL   Basophils Absolute 0.1 0.0 - 0.1 K/uL   WBC Morphology WHITE COUNT CONFIRMED ON SMEAR   Comprehensive metabolic panel     Status: None   Collection Time: 12/18/17  6:44 PM  Result Value Ref Range   Sodium 141 135 - 145 mmol/L   Potassium 4.0 3.5 - 5.1 mmol/L   Chloride 104 98 - 111 mmol/L   CO2 25 22 - 32 mmol/L   Glucose, Bld 91 70 - 99 mg/dL   BUN 12 6 - 20 mg/dL   Creatinine, Ser 0.71 0.44 - 1.00 mg/dL   Calcium 9.4 8.9 - 10.3 mg/dL   Total Protein 6.5 6.5 - 8.1 g/dL   Albumin 3.9 3.5 - 5.0 g/dL   AST 32  15 - 41 U/L   ALT 31 0 - 44 U/L   Alkaline Phosphatase 56 38 - 126 U/L   Total Bilirubin 0.5 0.3 - 1.2 mg/dL   GFR calc non Af Amer >60 >60 mL/min   GFR calc Af Amer >60 >60 mL/min   Anion gap 12 5 - 15    IMAGING No results found.  MAU COURSE Orders Placed This Encounter  Procedures  . Urinalysis, Routine w reflex microscopic  . CBC with Differential/Platelet  . Comprehensive metabolic panel   Meds ordered this encounter  Medications  . lactated ringers bolus 1,000 mL  . ketorolac (TORADOL) 30 MG/ML injection 30 mg    MDM Elevated BPs. Pt has taken all of her antihypertensives today as prescribed. BPs consistent with prepregnancy BPs. Pt asymptomatic.   Some TTP throughout  abdomen, but abdominal exam otherwise benign. Incision looks good.   Labs & IV fluids ordered. 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 Took over 1 hour to get IV started & 2 hours to collect labs. Pt given IV fluids bolus (per patient request) & IV toradol 30 mg.   Afebrile. No leukocytosis, & LFTs returned to normal.   Care turned over to [place] NP, 12/18/17, [place] of patient assumed by Manya Silvas, CNM at [phone].    No improvement in pain with Toradol.  Tenderness to palpation present throughout entire low abdomen from umbilicus to pubic bone.  Reviewed normal labs and UA with patient who was reassured.  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   Feeling better after Phenergan and fentanyl.  Will give 1 Percocet for longer-lasting effect.  - No evidence of incisional or uterine infection.  Suspect pain is GI-related.  Discussed with Dr. Elonda Husky.  Ileus has been ruled out by patient having bowel movements though she is having difficulty and pain with them.  Will treat constipation with MiraLAX 1-2 times a day as needed for soft, daily bowel movements.  Will stop ibuprofen and give 5 days-worth of Toradol and prescribed 15 Percocet tablets to be used sparingly.  Phenergan as needed if nausea returns.  -Chronic hypertension without evidence of endorgan involvement.  Few severe-range blood pressures but this is consistent with prepregnancy blood pressures.  No change in meds per Dr. Elonda Husky.   Assessment 1. Postoperative pain   2. Status post cesarean delivery   3. Non-intractable vomiting with nausea, unspecified vomiting type   4. Constipation due to pain medication therapy   5. Chronic hypertension    Plan Discharge home in stable condition per consult with Dr.  Elonda Husky. Follow-up [facility] for [facility] Follow up on 12/22/2017.   Specialty:  Obstetrics and Gynecology Why:  As scheduled or sooner as needed if symptoms worsen Contact information: [place] Follow up.   Why:  Needed in pregnancy-related emergencies Contact information: [address] [id number] [place] [phone]         Allergies as of 12/18/2017      Reactions   Red Dye Other (See Comments)   ALL CAUSE PROBLEMS #40 IS WORST-MIGRAINES    Compazine [prochlorperazine Maleate] Other (See Comments)   TWITCHING,  CANT STAY STILL. Pt states she can tolerate promethazine   Other Other (See Comments)   FANSIDAR FOR MALARIA-ASTHMA   Vicodin [hydrocodone-acetaminophen] Itching, Nausea And Vomiting   Pt states she can tolerate acetaminophen   Wheat Bran Other (See Comments)   GLUTEN SENSITIVE   Reglan [metoclopramide] Other (See Comments)   restless      Medication List    TAKE these medications   acetaminophen 500 MG tablet Commonly known as:  TYLENOL Take 1,000 mg by mouth every 8 (eight) hours as needed for mild pain or headache.   albuterol 108 (90 Base) MCG/ACT inhaler Commonly known as:  PROVENTIL HFA;VENTOLIN HFA Inhale 1 puff into the lungs every 6 (six) hours as needed for wheezing or shortness of breath.   amLODipine 10 MG tablet Commonly known as:  NORVASC Take 1 tablet (10 mg total) by mouth daily.   cetirizine 10 MG tablet Commonly known as:  ZYRTEC Take 10 mg by mouth daily.   cyclobenzaprine 10 MG tablet Commonly known as:  FLEXERIL Take 1 tablet (10 mg total) by mouth every 8 (eight) hours as needed for muscle spasms.   diphenhydrAMINE 25 mg capsule Commonly known as:  BENADRYL Take 1 capsule (25 mg total) by mouth every 6 (six) hours as needed. What changed:    when to take  this  reasons to take this   ibuprofen 600 MG tablet Commonly known as:  ADVIL,MOTRIN TAKE ONE TABLET BY MOUTH EVERY 6 HOURS AS NEEDED FOR HEADACHE, MILD PAIN OR CRAMPING   ketorolac 10 MG tablet Commonly known as:  TORADOL Take 1 tablet (10 mg total) by mouth every 6 (six) hours as needed.   omeprazole 20 MG capsule Commonly known as:  PRILOSEC Take 20 mg by mouth daily.   ondansetron 4 MG tablet Commonly known as:  ZOFRAN Take 4 mg by mouth every 8 (eight) hours as needed for nausea or vomiting.   oxyCODONE-acetaminophen 5-325 MG tablet Commonly known as:  PERCOCET/ROXICET Take 1 tablet by mouth every 6 (six) hours as needed. What changed:  how much to take   polyethylene glycol powder powder Commonly known as:  GLYCOLAX/MIRALAX Take 17 g by mouth daily as needed for moderate constipation.   PRENATAL PO Take 1 tablet by mouth daily.   promethazine 25 MG tablet Commonly known as:  PHENERGAN Take 1 tablet (25 mg total) by mouth every 6 (six) hours as needed for nausea or vomiting.     ASK your doctor about these medications   hydrochlorothiazide 25 MG tablet Commonly known as:  HYDRODIURIL Take 1 tablet (25 mg total) by mouth daily.   labetalol 200 MG tablet Commonly known as:  NORMODYNE Take 2 tablets (400 mg total) by mouth 3 (three) times daily.       [name], [place], [place] 12/18/2017 11:09 PM

## 2017-12-18 NOTE — MAU Note (Signed)
Pt presents with c/o sharp, stabbing constant incision pain.  Also c/o N&V, emesis 10x in 24 hours.  Reports took Percocet & Ibuprofen for pain, no relief.  States has taken Zofran for N&V with some relief noted.   S/P primary C/S 12/07/2017.  RN assessed incision, incision without redness, swelling or drainage.  Well approximated and appears to be healing well.

## 2017-12-18 NOTE — Progress Notes (Signed)
Pt c/o swelling @ IV site.  RN assessed site, site red @ insertion, but no swelling noted.  Site also evaluated by charge RN, Kristen Loader, she agrees with primary RN assessment.

## 2017-12-20 LAB — URINE CULTURE
Culture: 80000 — AB
Special Requests: NORMAL

## 2017-12-22 ENCOUNTER — Ambulatory Visit (INDEPENDENT_AMBULATORY_CARE_PROVIDER_SITE_OTHER): Payer: Medicaid Other | Admitting: *Deleted

## 2017-12-22 ENCOUNTER — Ambulatory Visit (INDEPENDENT_AMBULATORY_CARE_PROVIDER_SITE_OTHER): Payer: Self-pay | Admitting: Clinical

## 2017-12-22 VITALS — BP 129/85 | HR 79 | Ht 71.0 in | Wt 221.9 lb

## 2017-12-22 DIAGNOSIS — F430 Acute stress reaction: Secondary | ICD-10-CM

## 2017-12-22 DIAGNOSIS — Z23 Encounter for immunization: Secondary | ICD-10-CM

## 2017-12-22 DIAGNOSIS — O115 Pre-existing hypertension with pre-eclampsia, complicating the puerperium: Principal | ICD-10-CM

## 2017-12-22 DIAGNOSIS — O1093 Unspecified pre-existing hypertension complicating the puerperium: Secondary | ICD-10-CM

## 2017-12-22 NOTE — BH Specialist Note (Signed)
Integrated Behavioral Health Initial Visit  MRN: [id number] Name: Krystal [name]  Number of [facility] Clinician visits:: 1/6 Session Start time: 2:15  Session End time: 2:52 Total time: 30 minutes  Type of Service: [place] Interpretor:No. Interpretor Name and Language: n/a   Warm Hand Off Completed.       SUBJECTIVE: Krystal [name] is a 27 y.o. female accompanied by n/a Patient was referred by Arlina Robes, MD for mood check postpartum. Patient reports the following symptoms/concerns:Pt states her primary symptom today is fatigue, and mild breast pumping issue. Pt states she experienced feelings of depression, anxiety and panic attacks in pregnancy,her symptoms diminished postpartum, and she feels she has coped well by relying on her strong faith, and supportive friends and family.  Duration of problem: Postpartum; Severity of problem: mild  OBJECTIVE: Mood: Normal and Affect: Appropriate Risk of harm to self or others: No plan to harm self or others  LIFE CONTEXT: Family and Social: Pt lives with her husband; newborn son is in NICU School/Work: - Self-Care: Keeps a positive outlook on life challenges Life Changes: Recent childbirth via cesarean with severe hypertension and pre-eclampsia  GOALS ADDRESSED: Patient will: 1. Maintain reduction of  symptoms of: anxiety, depression and stress 2. Increase knowledge and/or ability of: self-management skills  3. Demonstrate ability to: Increase healthy adjustment to current life circumstances and Increase adequate support systems for patient/family  INTERVENTIONS: Interventions utilized: Supportive Counseling, Psychoeducation and/or Health Education and Link to [employer]  Standardized Assessments completed: GAD-7 and PHQ 9  ASSESSMENT: Patient currently experiencing Acute stress reaction   Patient may benefit from psychoeducation and brief therapeutic  interventions regarding maintaining reduction of  symptoms of depression and anxiety .  PLAN: 1. Follow up with behavioral health clinician on : As needed 2. Behavioral recommendations:  -Set up appointment to see Lactation Consultant after NICU visit today -Continue taking prenatal vitamin, until postpartum visit, as recommended by medical provider -Attend at least one Mom Talk/Breastfeeding Support group while baby is in NICU -Continue to allow friends and family help with practical support -Read educational materials regarding coping with symptoms of depression and anxiety  3. Referral(s): [facility] (In Clinic) 4. "From scale of 1-10, how likely are you to follow plan?": 10  Garlan Fair, LCSW  Depression screen [facility] 2/9 12/22/2017  Decreased Interest 1  Down, Depressed, Hopeless 1  PHQ - 2 Score 2  Altered sleeping 2  Tired, decreased energy 2  Change in appetite 0  Feeling bad or failure about yourself  2  Trouble concentrating 0  Moving slowly or fidgety/restless 0  Suicidal thoughts 0  PHQ-9 Score 8   GAD 7 : Generalized Anxiety Score 12/22/2017  Nervous, Anxious, on Edge 1  Control/stop worrying 0  Worry too much - different things 0  Trouble relaxing 0  Restless 0  Easily annoyed or irritable 0  Afraid - awful might happen 2  Total GAD 7 Score 3

## 2017-12-22 NOTE — Progress Notes (Signed)
Pt seen @ MAU 4 days ago and had elevated BP readings, constipation and poor post op pain control. BP is wnl today and pt denies sx of pre-e. Today pt reports improvement with constipation -has BM daily. She also states her pain is less and is managed with Toradol and sporadic Oxycodone.  Pt states baby in NICU is doing well and has gained weight. Incision check completed and found to be well-healed. No redness, swelling or drainage noted. Pt reports some numbness @ incision. She was informed this is normal. Pt reports occasional episodes of "gush" of vaginal bleeding for last several days - usually after lying down for awhile and once daily. She denies persistent heavy bleeding. Pt advised this is normal and if bleeding becomes persistently heavy, she should go to MAU. Pt requested flu vaccine - given per standing order. Per consult w/Lisa Leftwich-Kirby, pt needs BP check in 1 week and PP appt as scheduled on 01/04/18. Pt voiced understanding and agreed to plan of care. Pt taken to see [facility] as scheduled.

## 2017-12-29 ENCOUNTER — Ambulatory Visit (INDEPENDENT_AMBULATORY_CARE_PROVIDER_SITE_OTHER): Payer: Self-pay | Admitting: *Deleted

## 2017-12-29 VITALS — BP 138/78 | HR 79 | Wt 226.9 lb

## 2017-12-29 DIAGNOSIS — Z0130 Encounter for examination of blood pressure without abnormal findings: Secondary | ICD-10-CM

## 2017-12-29 NOTE — Progress Notes (Signed)
Pt here for blood pressure check.  Pt BP 138/78.  Pt reports she had a slight headache last night that resolved with medication.  Pt reports she checks her blood pressures at home.  Reviewed frequency of blood pressure checks and pre-eclampsia precautions reviewed.  Pt instructed to call if her BP is 140/90 or greater, if she has a headache that does not improve with medication, or if she has a headache with spots or blurred vision.  Pt verbalized understanding of all teaching.

## 2018-01-04 ENCOUNTER — Ambulatory Visit (INDEPENDENT_AMBULATORY_CARE_PROVIDER_SITE_OTHER): Payer: Self-pay | Admitting: Clinical

## 2018-01-04 ENCOUNTER — Ambulatory Visit (INDEPENDENT_AMBULATORY_CARE_PROVIDER_SITE_OTHER): Payer: Medicaid Other | Admitting: Obstetrics and Gynecology

## 2018-01-04 ENCOUNTER — Encounter: Payer: Self-pay | Admitting: Obstetrics and Gynecology

## 2018-01-04 VITALS — BP 132/83 | HR 63 | Wt 228.3 lb

## 2018-01-04 DIAGNOSIS — F53 Postpartum depression: Secondary | ICD-10-CM

## 2018-01-04 DIAGNOSIS — O10919 Unspecified pre-existing hypertension complicating pregnancy, unspecified trimester: Secondary | ICD-10-CM

## 2018-01-04 DIAGNOSIS — O1423 HELLP syndrome (HELLP), third trimester: Secondary | ICD-10-CM

## 2018-01-04 DIAGNOSIS — Z3009 Encounter for other general counseling and advice on contraception: Secondary | ICD-10-CM

## 2018-01-04 DIAGNOSIS — Z658 Other specified problems related to psychosocial circumstances: Secondary | ICD-10-CM

## 2018-01-04 DIAGNOSIS — R195 Other fecal abnormalities: Secondary | ICD-10-CM

## 2018-01-04 DIAGNOSIS — F4321 Adjustment disorder with depressed mood: Secondary | ICD-10-CM

## 2018-01-04 DIAGNOSIS — O99345 Other mental disorders complicating the puerperium: Secondary | ICD-10-CM

## 2018-01-04 NOTE — Patient Instructions (Addendum)
Take Colace (docusate sodium) 100 mg once or twice daily. You may buy this OTC.   Intrauterine Device Information An intrauterine device (IUD) is inserted into your uterus to prevent pregnancy. There are two types of IUDs available:  Copper IUD-This type of IUD is wrapped in copper wire and is placed inside the uterus. Copper makes the uterus and fallopian tubes produce a fluid that kills sperm. The copper IUD can stay in place for 10 years.  Hormone IUD-This type of IUD contains the hormone progestin (synthetic progesterone). The hormone thickens the cervical mucus and prevents sperm from entering the uterus. It also thins the uterine lining to prevent implantation of a fertilized egg. The hormone can weaken or kill the sperm that get into the uterus. One type of hormone IUD can stay in place for 5 years, and another type can stay in place for 3 years.  Your health care provider will make sure you are a good candidate for a contraceptive IUD. Discuss with your health care provider the possible side effects. Advantages of an intrauterine device  IUDs are highly effective, reversible, long acting, and low maintenance.  There are no estrogen-related side effects.  An IUD can be used when breastfeeding.  IUDs are not associated with weight gain.  The copper IUD works immediately after insertion.  The hormone IUD works right away if inserted within 7 days of your period starting. You will need to use a backup method of birth control for 7 days if the hormone IUD is inserted at any other time in your cycle.  The copper IUD does not interfere with your female hormones.  The hormone IUD can make heavy menstrual periods lighter and decrease cramping.  The hormone IUD can be used for 3 or 5 years.  The copper IUD can be used for 10 years. Disadvantages of an intrauterine device  The hormone IUD can be associated with irregular bleeding patterns.  The copper IUD can make your menstrual flow  heavier and more painful.  You may experience cramping and vaginal bleeding after insertion. This information is not intended to replace advice given to you by your health care provider. Make sure you discuss any questions you have with your health care provider. Document Released: 01/15/2004 Document Revised: 07/19/2015 Document Reviewed: 08/01/2012 Elsevier Interactive Patient Education  2017 [place] implant What is this medicine? ETONOGESTREL (et oh noe JES trel) is a contraceptive (birth control) device. It is used to prevent pregnancy. It can be used for up to 3 years. This medicine may be used for other purposes; ask your health care provider or pharmacist if you have questions. COMMON BRAND NAME(S): Implanon, Nexplanon What should I tell my health care provider before I take this medicine? They need to know if you have any of these conditions: -abnormal vaginal bleeding -blood vessel disease or blood clots -cancer of the breast, cervix, or liver -depression -diabetes -gallbladder disease -headaches -heart disease or recent heart attack -high blood pressure -high cholesterol -kidney disease -liver disease -renal disease -seizures -tobacco smoker -an unusual or allergic reaction to etonogestrel, other hormones, anesthetics or antiseptics, medicines, foods, dyes, or preservatives -pregnant or trying to get pregnant -breast-feeding How should I use this medicine? This device is inserted just under the skin on the inner side of your upper arm by a health care professional. Talk to your pediatrician regarding the use of this medicine in children. Special care may be needed. Overdosage: If you think you have taken too much  of this medicine contact a poison control center or emergency room at once. NOTE: This medicine is only for you. Do not share this medicine with others. What if I miss a dose? This does not apply. What may interact with this medicine? Do  not take this medicine with any of the following medications: -amprenavir -bosentan -fosamprenavir This medicine may also interact with the following medications: -barbiturate medicines for inducing sleep or treating seizures -certain medicines for fungal infections like ketoconazole and itraconazole -grapefruit juice -griseofulvin -medicines to treat seizures like carbamazepine, felbamate, oxcarbazepine, phenytoin, topiramate -modafinil -phenylbutazone -rifampin -rufinamide -some medicines to treat HIV infection like atazanavir, indinavir, lopinavir, nelfinavir, tipranavir, ritonavir -St. John's wort This list may not describe all possible interactions. Give your health care provider a list of all the medicines, herbs, non-prescription drugs, or dietary supplements you use. Also tell them if you smoke, drink alcohol, or use illegal drugs. Some items may interact with your medicine. What should I watch for while using this medicine? This product does not protect you against HIV infection (AIDS) or other sexually transmitted diseases. You should be able to feel the implant by pressing your fingertips over the skin where it was inserted. Contact your doctor if you cannot feel the implant, and use a non-hormonal birth control method (such as condoms) until your doctor confirms that the implant is in place. If you feel that the implant may have broken or become bent while in your arm, contact your healthcare provider. What side effects may I notice from receiving this medicine? Side effects that you should report to your doctor or health care professional as soon as possible: -allergic reactions like skin rash, itching or hives, swelling of the face, lips, or tongue -breast lumps -changes in emotions or moods -depressed mood -heavy or prolonged menstrual bleeding -pain, irritation, swelling, or bruising at the insertion site -scar at site of insertion -signs of infection at the insertion  site such as fever, and skin redness, pain or discharge -signs of pregnancy -signs and symptoms of a blood clot such as breathing problems; changes in vision; chest pain; severe, sudden headache; pain, swelling, warmth in the leg; trouble speaking; sudden numbness or weakness of the face, arm or leg -signs and symptoms of liver injury like dark yellow or brown urine; general ill feeling or flu-like symptoms; light-colored stools; loss of appetite; nausea; right upper belly pain; unusually weak or tired; yellowing of the eyes or skin -unusual vaginal bleeding, discharge -signs and symptoms of a stroke like changes in vision; confusion; trouble speaking or understanding; severe headaches; sudden numbness or weakness of the face, arm or leg; trouble walking; dizziness; loss of balance or coordination Side effects that usually do not require medical attention (report to your doctor or health care professional if they continue or are bothersome): -acne -back pain -breast pain -changes in weight -dizziness -general ill feeling or flu-like symptoms -headache -irregular menstrual bleeding -nausea -sore throat -vaginal irritation or inflammation This list may not describe all possible side effects. Call your doctor for medical advice about side effects. You may report side effects to FDA at 1-800-FDA-1088. Where should I keep my medicine? This drug is given in a hospital or clinic and will not be stored at home. NOTE: This sheet is a summary. It may not cover all possible information. If you have questions about this medicine, talk to your doctor, pharmacist, or health care provider.  2018 Elsevier/Gold Standard (2015-08-30 11:19:22)

## 2018-01-04 NOTE — Progress Notes (Signed)
Obstetrics/Postpartum Visit  Appointment Date: 01/04/2018  OBGYN Clinic: [facility]  Primary Care Provider: Ronita Hipps  Chief Complaint:  Chief Complaint  Patient presents with  . Postpartum Care    History of Present Illness: Avarae Zwart Whitaker is a 27 y.o. Caucasian G1P0101 (No LMP recorded.), seen for the above chief complaint. Her past medical history is significant for cHTN.  She is s/p 1LTCS on 12/07/17 at [redacted]w[redacted]d weeks for chronic HTN with superimposed HELLP; she was discharged to home on POD#3. Pregnancy complicated by chronic HTN.  Complains of occasional shooting pain at right aspect of incision. Also feeling very overwhelmed and tearful, requesting to see behavorial health.  Vaginal bleeding or discharge: spotting Breast or formula feeding: pumping Intercourse: No  Contraception: interested in IUD or Nexplanon PP depression s/s: Yes  Any bowel or bladder issues: Yes , painful stools Pap smear: needs to sign release of records, prenatal care done with group in [place]  Review of Systems: Positive for anxiety.   Her 12 point review of systems is negative or as noted in the History of Present Illness.  Patient Active Problem List   Diagnosis Date Noted  . 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 Severe hypertension affecting pregnancy in second trimester 12/06/2017  . Chronic hypertension during pregnancy, antepartum 10/08/2017    Medications Dorcas C. Penagos had no medications administered during this visit. Current Outpatient Medications  Medication Sig Dispense Refill  . acetaminophen (TYLENOL) 500 MG tablet Take 1,000 mg by mouth every 8 (eight) hours as needed for mild pain or headache.    . albuterol (PROVENTIL HFA;VENTOLIN HFA) 108 (90 Base) MCG/ACT inhaler Inhale 1 puff into the lungs every 6 (six) hours as needed for wheezing or shortness of breath.    Marland Kitchen amLODipine (NORVASC) 10  MG tablet Take 1 tablet (10 mg total) by mouth daily. 30 tablet 3  . cetirizine (ZYRTEC) 10 MG tablet Take 10 mg by mouth daily.    . diphenhydrAMINE (BENADRYL) 25 mg capsule Take 1 capsule (25 mg total) by mouth every 6 (six) hours as needed. 30 capsule 0  . hydrochlorothiazide (HYDRODIURIL) 25 MG tablet Take 1 tablet (25 mg total) by mouth daily. 30 tablet 3  . ibuprofen (ADVIL,MOTRIN) 600 MG tablet TAKE ONE TABLET BY MOUTH EVERY 6 HOURS AS NEEDED FOR HEADACHE, MILD PAIN OR CRAMPING 30 tablet 0  . labetalol (NORMODYNE) 200 MG tablet Take 2 tablets (400 mg total) by mouth 3 (three) times daily. 90 tablet 3  . omeprazole (PRILOSEC) 20 MG capsule Take 20 mg by mouth daily.    . polyethylene glycol powder (GLYCOLAX/MIRALAX) powder Take 17 g by mouth daily as needed for moderate constipation. 500 g 2  . Prenatal Vit-Fe Fumarate-FA (PRENATAL PO) Take 1 tablet by mouth daily.    . cyclobenzaprine (FLEXERIL) 10 MG tablet Take 1 tablet (10 mg total) by mouth every 8 (eight) hours as needed for muscle spasms. (Patient not taking: Reported on 12/22/2017) 30 tablet 1  . ketorolac (TORADOL) 10 MG tablet Take 1 tablet (10 mg total) by mouth every 6 (six) hours as needed. (Patient not taking: Reported on 01/04/2018) 20 tablet 0  . ondansetron (ZOFRAN) 4 MG tablet Take 4 mg by mouth every 8 (eight) hours as needed for nausea or vomiting.    Marland Kitchen oxyCODONE-acetaminophen (PERCOCET/ROXICET) 5-325 MG tablet Take 1 tablet by mouth every 6 (six) hours as needed. (Patient not taking: Reported on  01/04/2018) 15 tablet 0  . promethazine (PHENERGAN) 25 MG tablet Take 1 tablet (25 mg total) by mouth every 6 (six) hours as needed for nausea or vomiting. (Patient not taking: Reported on 12/22/2017) 30 tablet 0   No current facility-administered medications for this visit.    Allergies Red dye; Compazine [prochlorperazine maleate]; Other; Vicodin [hydrocodone-acetaminophen]; Wheat bran; and Reglan [metoclopramide]  Physical  Exam:  BP 132/83   Pulse 63   Wt 228 lb 4.8 oz (103.6 kg)   BMI 31.84 kg/m  Body mass index is 31.84 kg/m. General appearance: Well nourished, well developed female in no acute distress. Tearful with interview Cardiovascular: regular rate and rhythm Respiratory:  Clear to auscultation bilateral. Normal respiratory effort Abdomen: positive bowel sounds and no masses, hernias; diffusely non tender to palpation, non distended, well healed pfannenstiel incision Breasts: not examined. Neuro/Psych:  Normal mood and affect.  Skin:  Warm and dry.    PP Depression Screening:   Edinburgh Postnatal Depression Scale - 01/04/18 0827      Edinburgh Postnatal Depression Scale:  In the Past 7 Days   I have been able to laugh and see the funny side of things.  0    I have looked forward with enjoyment to things.  1    I have blamed myself unnecessarily when things went wrong.  3    I have been anxious or worried for no good reason.  0    I have felt scared or panicky for no good reason.  2    Things have been getting on top of me.  3    I have been so unhappy that I have had difficulty sleeping.  2    I have felt sad or miserable.  3    I have been so unhappy that I have been crying.  3    The thought of harming myself has occurred to me.  0    Edinburgh Postnatal Depression Scale Total  17       Assessment: Patient is a 27 y.o. G1P0101 who is 4 weeks post partum from a 1LTCS for cHTN with superimposed HELLP. She is doing well physically, reviewed normal and expected post c-section course. Mentally, she is feeling very overwhelmed with baby in NICU and family issues.    Plan:   1. Chronic hypertension during pregnancy, antepartum BP stable on 3 meds, reviewed reasons to present to [facility] Cont current regimen for now She has PCP with [facility], will call to re-establish care with them for ongoing BP management  2. Hemolysis, elevated liver enzymes, and low platelet (HELLP) syndrome during pregnancy  in third trimester Appears resolved  3. Depression affecting pregnancy, postpartum Denies thoughts of suicidal/homicidal ideation, she is requesting referral to behavioral health - Reviewed that she should call office with any issues - Reviewed that she may need medication or further management based on assessment by behavioral health, she is agreeable - Ambulatory referral to [place]  4. Hard stool Reviewed use of colace rather than miralax  5. Encounter for other general counseling or advice on contraception Reviewed options for contraception, specifically Nexplanon and IUD, she will consider and return for placement.   RTC 2 weeks for BP check IUD placement   K. Arvilla Meres, M.D. [facility]

## 2018-01-04 NOTE — BH Specialist Note (Signed)
Integrated Behavioral Health Follow Up Visit  MRN: [id number] Name: Krystal [name]  Number of [place] Clinician visits: 2/6 Session Start time: 9:15  Session End time: 10:11 Total time: 1 hour  Type of Service: [place] Interpretor:No. Interpretor Name and Language: n/a  SUBJECTIVE: Krystal [name] is a 27 y.o. female accompanied by n/a Patient was referred by Vivien Rota, MD for life stress. Patient reports the following symptoms/concerns: Pt states her primary concern today is feeling overwhelmed with emotion, being unable to "turn her brain off" in the midst of increasing life stress.  Duration of problem: Postpartum; Severity of problem: severe  OBJECTIVE: Mood: Anxious and Depressed and Affect: Appropriate and Tearful Risk of harm to self or others: No plan to harm self or others  LIFE CONTEXT: Family and Social: Pt lives with her husband; newborn daughter in NICU.  School/Work: - Self-Care: Attempts to keep a positive outlook, drinks plenty of water, keeping in close contact with family Life Changes: Since childbirth via cesarean with severe hypertension and pre-eclampsia: baby in NICU, MIL in hospice, husbands aunt and partner in car accident 5 days ago(aunt's partner passed away), pumping milk daily, baby shower in midst of stressful situation, etc.  GOALS ADDRESSED: Patient will: 1.  Reduce symptoms of: anxiety, depression and stress  2.  Increase knowledge and/or ability of: healthy habits  3.  Demonstrate ability to: Increase healthy adjustment to current life circumstances, Increase adequate support systems for patient/family and Begin healthy grieving over loss  INTERVENTIONS: Interventions utilized:  Supportive Counseling and Sleep Hygiene Standardized Assessments completed: Edinburgh Postnatal Depression  ASSESSMENT: Patient currently experiencing Grief and Psychosocial stress.   Patient may  benefit from continued therapeutic interventions regarding coping with symptoms of anxiety and depression related to current grief and life stress.  PLAN: 1. Follow up with behavioral health clinician on : As needed 2. Behavioral recommendations:  -Begin taking melatonin at bedtime, as recommended by medical provider; continue nightly for as long as remains helpful for sleep -Attend Mom Talk/Breastfeeding support groups tomorrow morning 10am/11am -Continue to use self-coping strategies that have helped in the past  3. Referral(s): [place] (In Clinic) and [employer] Resources:  Social support via Mom Talk 4. "From scale of 1-10, how likely are you to follow plan?": 9  Caroleen Hamman McMannes, LCSW  Depression screen [facility] 2/9 12/22/2017  Decreased Interest 1  Down, Depressed, Hopeless 1  PHQ - 2 Score 2  Altered sleeping 2  Tired, decreased energy 2  Change in appetite 0  Feeling bad or failure about yourself  2  Trouble concentrating 0  Moving slowly or fidgety/restless 0  Suicidal thoughts 0  PHQ-9 Score 8   GAD 7 : Generalized Anxiety Score 12/22/2017  Nervous, Anxious, on Edge 1  Control/stop worrying 0  Worry too much - different things 0  Trouble relaxing 0  Restless 0  Easily annoyed or irritable 0  Afraid - awful might happen 2  Total GAD 7 Score 3

## 2018-01-05 ENCOUNTER — Ambulatory Visit: Payer: Self-pay

## 2018-01-05 NOTE — Lactation Note (Signed)
This note was copied from a baby's chart. Lactation Consultation Note  Patient Name: Krystal [name] Today's Date: 01/05/2018  Visit with mom in the NICU.  She has an abundant milk supply.  C/o nipple tenderness with pumping.  She is using a 30 mm flange on right and 27 mm flange on left.  Mom states pump is pulling a lot of areolar tissue.  Examined nipples and both are intact without redness or swelling.  I feel mom was sized with a larger flange due to initial swelling.  I decreased size on the right to a 27 mm and mom states it felt better.  Mom will try the 24 mm at home to check for comfort.  Nipples are average size and the 27 mm may be too large after observing mom pump.  Encouraged to call for assist/concerns prn.   Maternal Data    Feeding Feeding Type: Breast Milk  LATCH Score                   Interventions    Lactation Tools Discussed/Used     Consult Status      Ave Filter 01/05/2018, 2:49 PM

## 2018-01-12 ENCOUNTER — Other Ambulatory Visit: Payer: Self-pay | Admitting: Student

## 2018-01-12 ENCOUNTER — Ambulatory Visit (INDEPENDENT_AMBULATORY_CARE_PROVIDER_SITE_OTHER): Payer: Medicaid Other | Admitting: Clinical

## 2018-01-12 DIAGNOSIS — Z658 Other specified problems related to psychosocial circumstances: Secondary | ICD-10-CM

## 2018-01-12 DIAGNOSIS — F4321 Adjustment disorder with depressed mood: Secondary | ICD-10-CM | POA: Diagnosis not present

## 2018-01-12 MED ORDER — SERTRALINE HCL 50 MG PO TABS: 50.0000 mg | ORAL_TABLET | Freq: Every day | ORAL | 2 refills | Status: DC

## 2018-01-12 NOTE — BH Specialist Note (Signed)
Integrated Behavioral Health Follow Up Visit  MRN: [id number] Name: Krystal [name]  Number of [place] Clinician visits: 3/6 Session Start time: 12:10  Session End time: 12:46 Total time: 30 minutes  Type of Service: [place] Interpretor:No. Interpretor Name and Language: n/a  SUBJECTIVE: Krystal [name] is a 27 y.o. female accompanied by n/a Patient was referred by Vivien Rota, MD, at last visit for life stress. Patient reports the following symptoms/concerns: Pt states her symptoms of depression and anxiety have increased in the past week, her family has noticed and urged her to "go back to talk to Malcolm". Pt attempted to cope without medication, but she is continuing to cry daily and has had several days the past week she was unable to motivate herself to get out of bed.  Duration of problem: Postpartum; Severity of problem: moderately severe  OBJECTIVE: Mood: Anxious and Depressed and Affect: Appropriate and Tearful Risk of harm to self or others: No plan to harm self or others  LIFE CONTEXT: Family and Social: Pt lives with her husband; nb daughter in NICU. Pt's mother arriving in January, will stay about one month.  School/Work: Pt not working at this time Self-Care: Surrounds herself with supportive family, keeps well-hydrated; attempts to keep a positive outlook Life Changes: Since childbirth early via cesarean(severe hypertension and pre-eclampsia), daughter in NICU, [place] in hospice, husband's aunt and partner in fatal auto accident(partner passed), stressful baby shower, pumping milk and visiting daughter in NICU daily  GOALS ADDRESSED: Patient will: 1.  Reduce symptoms of: anxiety, depression, insomnia and stress  2.  Increase knowledge and/or ability of: healthy habits  3.  Demonstrate ability to: Increase motivation to adhere to plan of care  INTERVENTIONS: Interventions utilized:  Brief CBT and  Supportive Counseling Standardized Assessments completed: Not Needed  ASSESSMENT: Patient currently experiencing Grief.   Patient may benefit from continued brief therapeutic interventions regarding coping with symptoms of anxiety and depression related to grief and adjusting to new motherhood.Marland Kitchen  PLAN: 1. Follow up with behavioral health clinician on : Two days via phone 2. Behavioral recommendations:  -Begin taking [place] medication today, as prescribed by medical provider -Attend Mom Talk/Breastfeeding Support group next Tuesday morning at 10am/11am -Talk to PCP about continuing [place] medication, OR establish care at [facility] of the [place] via walk-in clinic, prior to bringing daughter home from [place] using self-coping strategies daily that have helped in the past 3. Referral(s): [place] (In Clinic) and [place] (LME/Outside Clinic) 4. "From scale of 1-10, how likely are you to follow plan?": [place], LCSW

## 2018-01-14 ENCOUNTER — Telehealth: Payer: Self-pay | Admitting: Clinical

## 2018-01-14 NOTE — Telephone Encounter (Signed)
[facility] Medication Management Phone Note  Left HIPPA-compliant message to call back Roselyn Reef from [facility] [place] [facility]  at [phone].    MRN: [id number] NAME: [place]  Time Call Initiated: 1:56 Time Call Completed: 1:58 Total Call Time: 15 minutes  Current Medications:  Outpatient Medications Prior to Visit  Medication Sig Dispense Refill  . acetaminophen (TYLENOL) 500 MG tablet Take 1,000 mg by mouth every 8 (eight) hours as needed for mild pain or headache.    . albuterol (PROVENTIL HFA;VENTOLIN HFA) 108 (90 Base) MCG/ACT inhaler Inhale 1 puff into the lungs every 6 (six) hours as needed for wheezing or shortness of breath.    Marland Kitchen amLODipine (NORVASC) 10 MG tablet Take 1 tablet (10 mg total) by mouth daily. 30 tablet 3  . cetirizine (ZYRTEC) 10 MG tablet Take 10 mg by mouth daily.    . cyclobenzaprine (FLEXERIL) 10 MG tablet Take 1 tablet (10 mg total) by mouth every 8 (eight) hours as needed for muscle spasms. (Patient not taking: Reported on 12/22/2017) 30 tablet 1  . diphenhydrAMINE (BENADRYL) 25 mg capsule Take 1 capsule (25 mg total) by mouth every 6 (six) hours as needed. 30 capsule 0  . hydrochlorothiazide (HYDRODIURIL) 25 MG tablet Take 1 tablet (25 mg total) by mouth daily. 30 tablet 3  . ibuprofen (ADVIL,MOTRIN) 600 MG tablet TAKE ONE TABLET BY MOUTH EVERY 6 HOURS AS NEEDED FOR HEADACHE, MILD PAIN OR CRAMPING 30 tablet 0  . ketorolac (TORADOL) 10 MG tablet Take 1 tablet (10 mg total) by mouth every 6 (six) hours as needed. (Patient not taking: Reported on 01/04/2018) 20 tablet 0  . labetalol (NORMODYNE) 200 MG tablet Take 2 tablets (400 mg total) by mouth 3 (three) times daily. 90 tablet 3  . omeprazole (PRILOSEC) 20 MG capsule Take 20 mg by mouth daily.    . ondansetron (ZOFRAN) 4 MG tablet Take 4 mg by mouth every 8 (eight) hours as needed for nausea or vomiting.    Marland Kitchen oxyCODONE-acetaminophen (PERCOCET/ROXICET) 5-325  MG tablet Take 1 tablet by mouth every 6 (six) hours as needed. (Patient not taking: Reported on 01/04/2018) 15 tablet 0  . polyethylene glycol powder (GLYCOLAX/MIRALAX) powder Take 17 g by mouth daily as needed for moderate constipation. 500 g 2  . Prenatal Vit-Fe Fumarate-FA (PRENATAL PO) Take 1 tablet by mouth daily.    . promethazine (PHENERGAN) 25 MG tablet Take 1 tablet (25 mg total) by mouth every 6 (six) hours as needed for nausea or vomiting. (Patient not taking: Reported on 12/22/2017) 30 tablet 0  . sertraline (ZOLOFT) 50 MG tablet Take 1 tablet (50 mg total) by mouth daily. 30 tablet 2   No facility-administered medications prior to visit.     Patient has been able to get all medications filled as prescribed: No: Pt is picking medication up today.   Patient is currently taking all medications as prescribed: n/a  Patient reports experiencing side effects: n/a  Patient describes feeling this way on medications: n/a  Additional patient concerns: no concerns at this time  Patient advised to schedule appointment with provider for evaluation of medication side effects or additional concerns: [place], LCSW

## 2018-01-18 ENCOUNTER — Ambulatory Visit: Payer: Self-pay

## 2018-01-18 NOTE — Lactation Note (Signed)
This note was copied from a baby's chart. Lactation Consultation Note  Patient Name: Krystal [name] Today's Date: 01/18/2018  Mom states pumping is more comfortable since she decreased flange size.  She reports an abundant supply of [phone] mls per day.  No questions or concerns.  Encouraged to call for assist/concerns prn.   Maternal Data    Feeding Feeding Type: Breast Milk  LATCH Score                   Interventions    Lactation Tools Discussed/Used     Consult Status      Ave Filter 01/18/2018, 11:33 AM

## 2018-01-19 ENCOUNTER — Encounter: Payer: Self-pay | Admitting: Student

## 2018-01-19 ENCOUNTER — Ambulatory Visit (INDEPENDENT_AMBULATORY_CARE_PROVIDER_SITE_OTHER): Payer: Medicaid Other | Admitting: Student

## 2018-01-19 VITALS — BP 138/84 | HR 73 | Wt 236.5 lb

## 2018-01-19 DIAGNOSIS — Z3049 Encounter for surveillance of other contraceptives: Secondary | ICD-10-CM | POA: Diagnosis present

## 2018-01-19 DIAGNOSIS — Z3046 Encounter for surveillance of implantable subdermal contraceptive: Secondary | ICD-10-CM

## 2018-01-19 DIAGNOSIS — Z30017 Encounter for initial prescription of implantable subdermal contraceptive: Secondary | ICD-10-CM | POA: Insufficient documentation

## 2018-01-19 LAB — POCT PREGNANCY, URINE: Preg Test, Ur: NEGATIVE

## 2018-01-19 MED ORDER — ETONOGESTREL 68 MG ~~LOC~~ IMPL
68.0000 mg | DRUG_IMPLANT | Freq: Once | SUBCUTANEOUS | Status: AC
  Administered 2018-01-19: 68 mg via SUBCUTANEOUS

## 2018-01-19 NOTE — Progress Notes (Signed)
History:  Ms. Krystal [name] is a 27 y.o. G1P0101 who presents to clinic today for BP follow up and nexplanon.  Patient is tearful about her mother in law on hospice.  The following portions of the patient's history were reviewed and updated as appropriate: allergies, current medications, family history, past medical history, social history, past surgical history and problem list.  Review of Systems:  Review of Systems  Constitutional: Negative.   HENT: Negative.   Respiratory: Negative.   Cardiovascular: Negative.   Genitourinary: Negative.   Musculoskeletal: Negative.   Skin: Negative.   Neurological: Negative.   Feels well he    Objective:  Physical Exam BP 138/84   Pulse 73   Wt 236 lb 8 oz (107.3 kg)   BMI 32.99 kg/m  Physical Exam  Constitutional: She appears well-developed.  HENT:  Head: Normocephalic.  Neck: Normal range of motion.  Cardiovascular: Normal rate.  Pulmonary/Chest: Effort normal.  Abdominal: Soft.  C-section incision well-healed; no signs of infection.    Labs and Imaging No results found for this or any previous visit (from the past 24 hour(s)).  No results found.   Assessment & Plan:   1. Nexplanon insertion    2. Patient' BP well controlled today; she has follow up with [facility].  3. Continue with BP meds and anti-depressants.  4. See Nexplanon note below.  Starr Lake, CNM 01/19/2018   NEXPLANON INSERTION: Appropriate time out taken. Nexlanon site (left arm) identified and the area was prepped in usual sterile fashon. 2 cc of 1% lidocaine was used to anesthetize the area starting with the distal end.   Next, the area was cleansed with betadine and the Nexplanon was inserted without difficulty.  Pressure bandage was applied.  Pt was instructed to remove pressure bandage in a few hours, and keep insertion site covered with a bandaid for 3 days.   10:21 AM

## 2018-03-01 ENCOUNTER — Ambulatory Visit: Payer: Self-pay

## 2018-03-01 NOTE — Lactation Note (Signed)
This note was copied from a baby's chart. Lactation Consultation Note  Patient Name: Krystal [name] Today's Date: 03/01/2018 Reason for consult: Follow-up assessment;NICU baby Pumping is going well and milk supply is abundant.  Called to NICU for feeding assist with a nipple shield.  Baby continues to receive all NG feeds.  Baby not latching so a 20 mm nipple shield applied.  Baby held shield in her mouth but only a few weak sucks elicited.  Baby sleepy.  Reassured mom and praised her for pumping efforts.  Maternal Data    Feeding Feeding Type: Breast Fed  LATCH Score Latch: Too sleepy or reluctant, no latch achieved, no sucking elicited.  Audible Swallowing: None  Type of Nipple: Everted at rest and after stimulation  Comfort (Breast/Nipple): Soft / non-tender  Hold (Positioning): Assistance needed to correctly position infant at breast and maintain latch.  LATCH Score: 5  Interventions    Lactation Tools Discussed/Used Tools: Nipple Shields Nipple shield size: 20   Consult Status Consult Status: PRN    Ave Filter 03/01/2018, 3:18 PM

## 2018-03-21 ENCOUNTER — Other Ambulatory Visit (HOSPITAL_COMMUNITY): Payer: Self-pay | Admitting: Obstetrics and Gynecology

## 2018-03-25 ENCOUNTER — Ambulatory Visit: Payer: Self-pay

## 2018-03-25 DIAGNOSIS — M5126 Other intervertebral disc displacement, lumbar region: Secondary | ICD-10-CM | POA: Insufficient documentation

## 2018-03-25 DIAGNOSIS — S68412A Complete traumatic amputation of left hand at wrist level, initial encounter: Secondary | ICD-10-CM | POA: Insufficient documentation

## 2018-03-25 NOTE — Lactation Note (Signed)
This note was copied from a baby's chart. Lactation Consultation Note  Patient Name: Krystal [name] TDHRC'B Date: 03/25/2018  Mom requesting information on 2 medications.  Information shared from [facility] Medications in Mother's Milk.  Meloxicam L3 and Tramadol L4.  Mom has a herniated disc.  She will discuss with physician about other possibilities.   Maternal Data    Feeding Feeding Type: Breast Fed(pre-weight 3530 g; post-weight 3550 g)  LATCH Score Latch: Repeated attempts needed to sustain latch, nipple held in mouth throughout feeding, stimulation needed to elicit sucking reflex.  Audible Swallowing: A few with stimulation  Type of Nipple: Everted at rest and after stimulation  Comfort (Breast/Nipple): Soft / non-tender  Hold (Positioning): No assistance needed to correctly position infant at breast.  LATCH Score: 8  Interventions Interventions: Skin to skin  Lactation Tools Discussed/Used     Consult Status      Ave Filter 03/25/2018, 1:33 PM

## 2018-03-31 ENCOUNTER — Ambulatory Visit: Payer: Self-pay

## 2018-03-31 NOTE — Lactation Note (Signed)
This note was copied from a baby's chart. Lactation Consultation Note  Patient Name: Krystal [name] Today's Date: 03/31/2018   [name] born at [redacted] weeks gestation.  Now 43 weeks being d/c today.   Mom with fast let down.  Infant chokes and sputters and comes off and on breast. Infant gassy per mom.  Urged mom to just hand express that first letdown and try and keep her head higher than the breast. Discussed trying nipple shield also.  Urged mom to follow up with lactation as needed.Mom had questions about switching her to scheduled feeds.  Urged mom to feed her on cue and every 3 hours.  Explained that it is good for babies to feed on cue but that mom still needed to make sure she ate every three hours for now.  Mom has cone breastfeeding resource list.Patient being discharged today. .    Maternal Data    Feeding Feeding Type: Breast Fed  LATCH Score                   Interventions    Lactation Tools Discussed/Used     Consult Status      [name] 03/31/2018, 11:12 AM

## 2018-04-06 ENCOUNTER — Other Ambulatory Visit (HOSPITAL_COMMUNITY): Payer: Self-pay | Admitting: Obstetrics and Gynecology

## 2018-04-13 ENCOUNTER — Other Ambulatory Visit: Payer: Self-pay | Admitting: Student

## 2018-06-23 DIAGNOSIS — M47816 Spondylosis without myelopathy or radiculopathy, lumbar region: Secondary | ICD-10-CM | POA: Insufficient documentation

## 2018-06-23 DIAGNOSIS — M461 Sacroiliitis, not elsewhere classified: Secondary | ICD-10-CM | POA: Insufficient documentation

## 2018-07-04 ENCOUNTER — Other Ambulatory Visit: Payer: Self-pay | Admitting: Obstetrics and Gynecology

## 2018-07-20 DIAGNOSIS — F32A Depression, unspecified: Secondary | ICD-10-CM | POA: Insufficient documentation

## 2018-07-20 DIAGNOSIS — O99340 Other mental disorders complicating pregnancy, unspecified trimester: Secondary | ICD-10-CM | POA: Insufficient documentation

## 2019-04-21 DIAGNOSIS — O09299 Supervision of pregnancy with other poor reproductive or obstetric history, unspecified trimester: Secondary | ICD-10-CM

## 2019-04-21 HISTORY — DX: Supervision of pregnancy with other poor reproductive or obstetric history, unspecified trimester: O09.299

## 2019-06-07 DIAGNOSIS — Z98891 History of uterine scar from previous surgery: Secondary | ICD-10-CM | POA: Insufficient documentation

## 2019-06-07 DIAGNOSIS — E669 Obesity, unspecified: Secondary | ICD-10-CM | POA: Insufficient documentation

## 2019-06-07 DIAGNOSIS — J45909 Unspecified asthma, uncomplicated: Secondary | ICD-10-CM

## 2019-06-16 DIAGNOSIS — F419 Anxiety disorder, unspecified: Secondary | ICD-10-CM | POA: Diagnosis present

## 2019-08-13 IMAGING — US US OB TRANSVAGINAL
1 series · 13 of 28 positions shown · non-contrast
Comparison: None.

CLINICAL DATA: Nausea for 3 weeks. Patient is 6 weeks and 3 days
pregnant based on her last menstrual period. Beta HCG level is
[DATE].

EXAM:
OBSTETRIC <14 WK US AND TRANSVAGINAL OB US
TECHNIQUE: Both transabdominal and transvaginal ultrasound examinations were
performed for complete evaluation of the gestation as well as the
maternal uterus, adnexal regions, and pelvic cul-de-sac.
Transvaginal technique was performed to assess early pregnancy.

[Series 1: us ob transvaginal · 0.23mm/px · 13 of 58 slices shown]
[im 3/58]
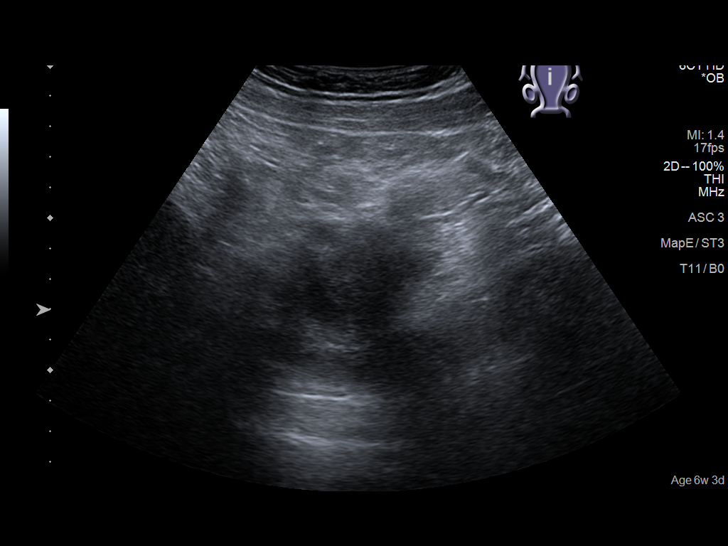
[im 7/58]
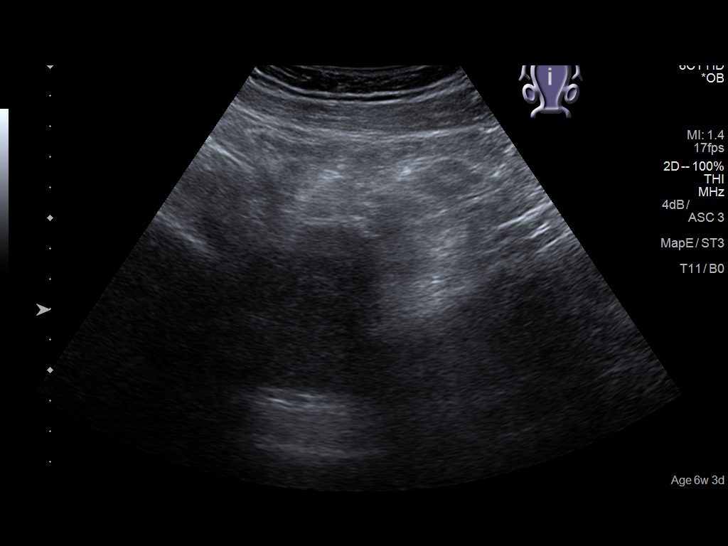
[im 11/58]
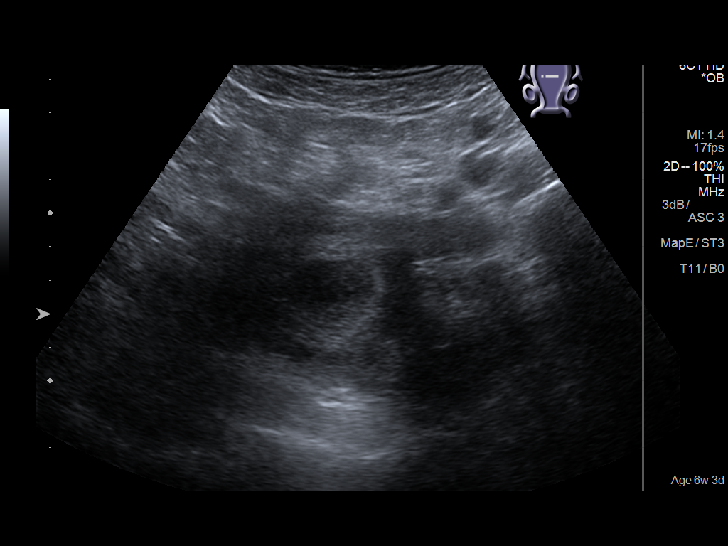
[im 15/58]
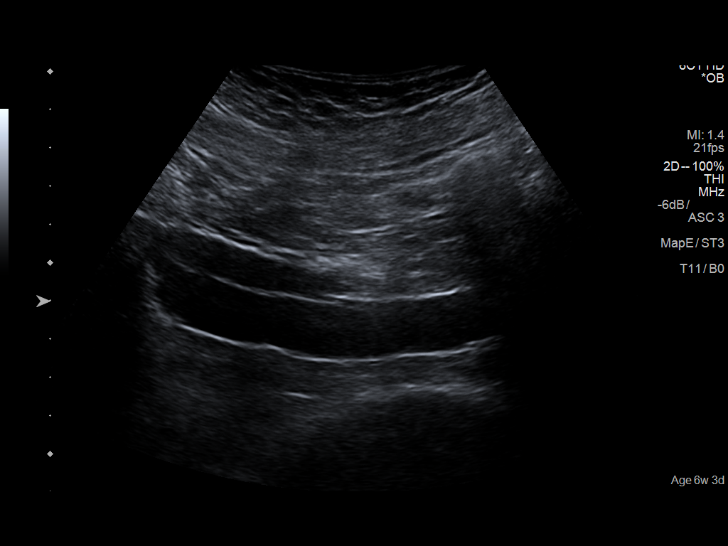
[im 20/58]
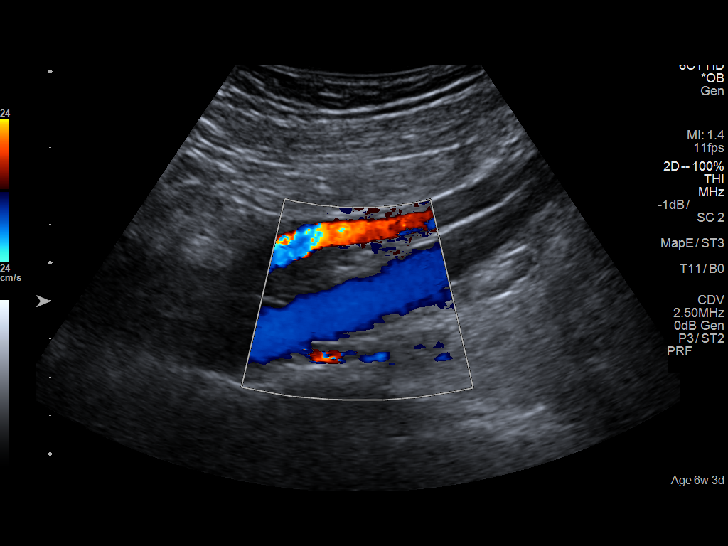
[im 24/58]
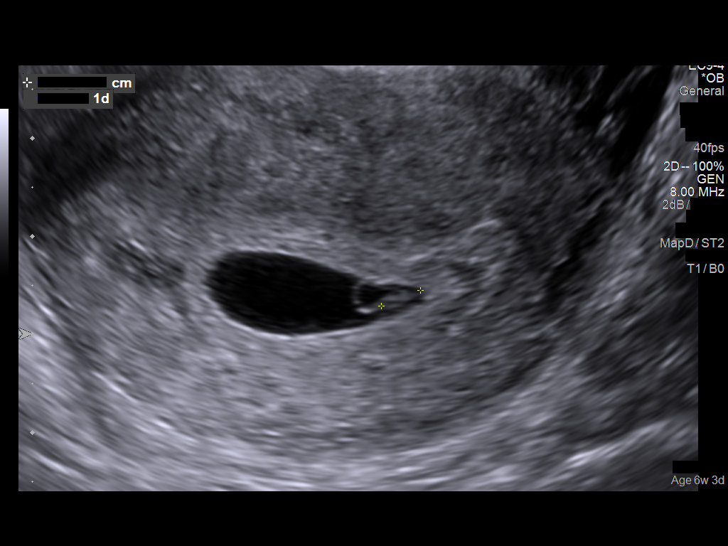
[im 30/58]
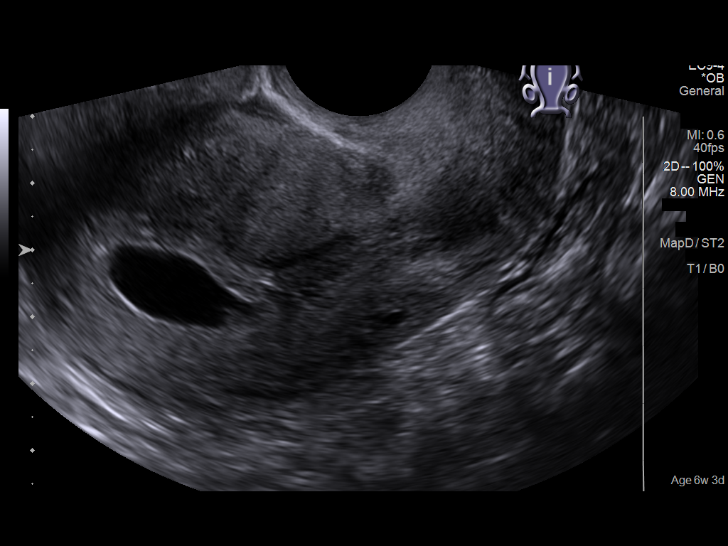
[im 34/58]
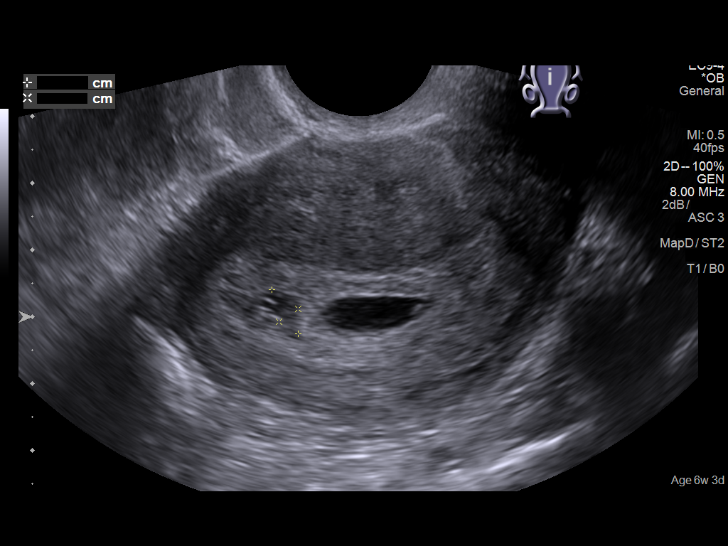
[im 39/58]
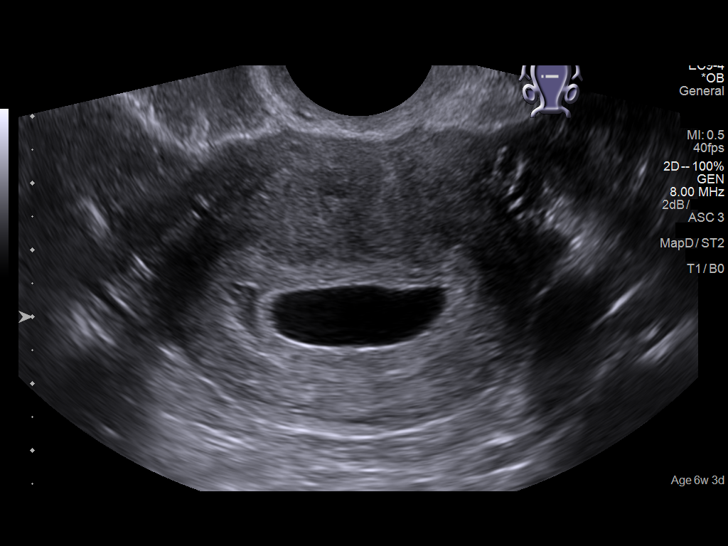
[im 43/58]
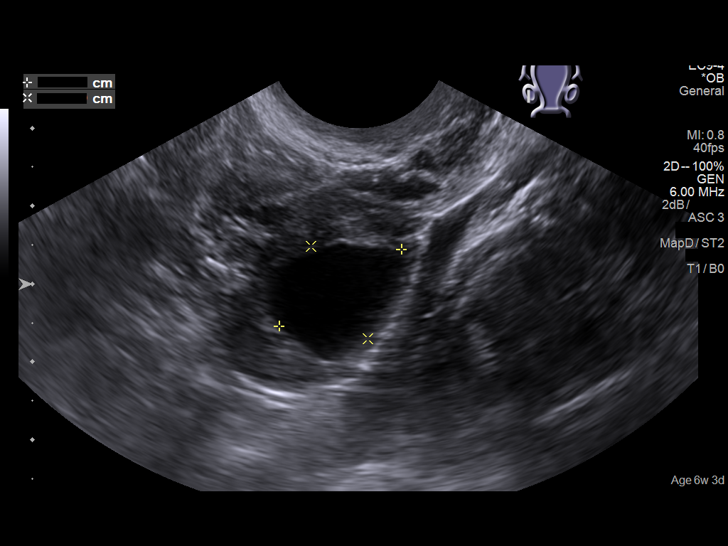
[im 47/58]
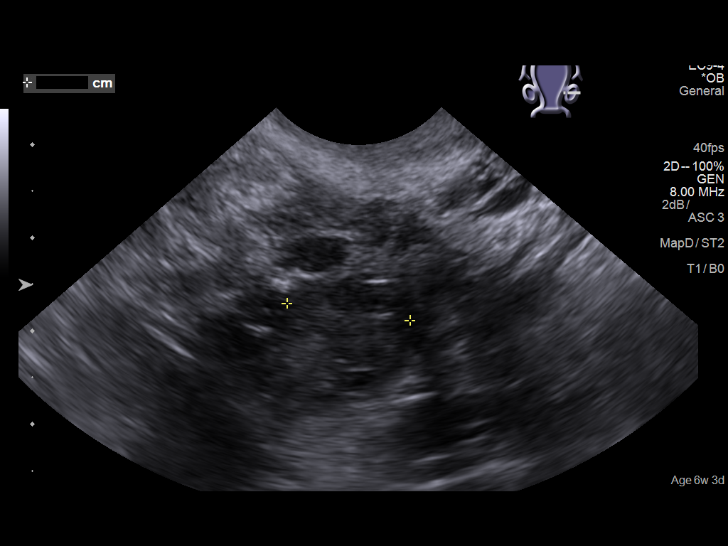
[im 51/58]
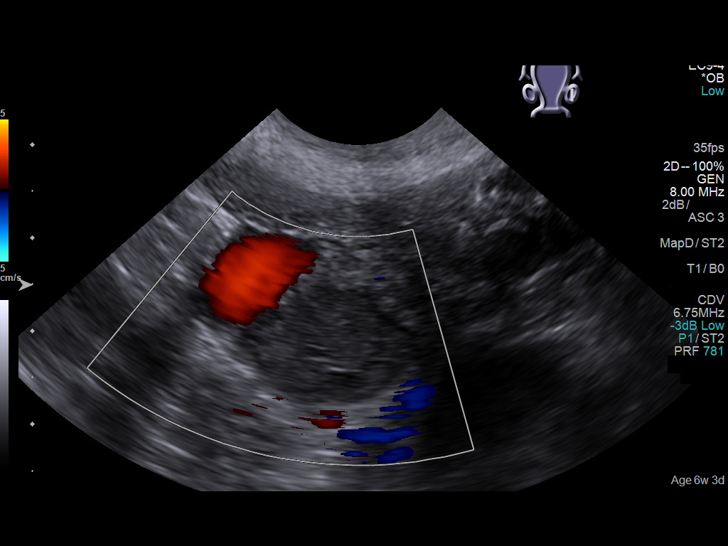
[im 55/58]
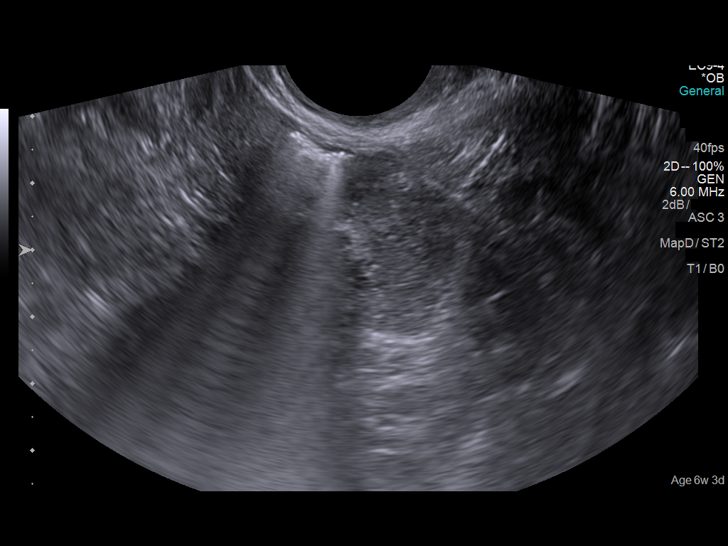

[13 of 28 positions shown; findings below may reference images not displayed]

FINDINGS: Intrauterine gestational sac: Single

Yolk sac:  Visualized.

Embryo:  Visualized.

Cardiac Activity: Visualized.

Heart Rate: 144 bpm

CRL:  4.4 mm   6 w   1 d                  US EDC: 03/12/2018

Subchorionic hemorrhage: Small subchronic hemorrhage measuring 8 x 3
x 7 mm.

Maternal uterus/adnexae: No uterine masses. Cervix is closed. Right
ovarian corpus luteum measuring 19 mm. Ovaries and adnexa otherwise
unremarkable. No abnormal pelvic free fluid.
IMPRESSION: 1. Single live anterior pregnancy with a measured gestational age of
6 weeks and 1 day, consistent with the expected gestational age
based on the last menstrual period.
2. Small subchronic hemorrhage. No other pregnancy complication.
Ovaries and adnexa are unremarkable.

## 2019-10-03 DIAGNOSIS — E038 Other specified hypothyroidism: Secondary | ICD-10-CM | POA: Insufficient documentation

## 2019-10-03 DIAGNOSIS — E063 Autoimmune thyroiditis: Secondary | ICD-10-CM | POA: Insufficient documentation

## 2019-11-30 DIAGNOSIS — F53 Postpartum depression: Secondary | ICD-10-CM | POA: Insufficient documentation

## 2019-12-11 ENCOUNTER — Emergency Department (HOSPITAL_COMMUNITY)
Admission: EM | Admit: 2019-12-11 | Discharge: 2019-12-12 | Disposition: A | Discharge status: Home / Self Care | Payer: Medicaid Other | Attending: Emergency Medicine | Admitting: Emergency Medicine

## 2019-12-11 ENCOUNTER — Other Ambulatory Visit: Payer: Self-pay

## 2019-12-11 ENCOUNTER — Encounter (HOSPITAL_COMMUNITY): Payer: Self-pay

## 2019-12-11 DIAGNOSIS — R600 Localized edema: Secondary | ICD-10-CM | POA: Insufficient documentation

## 2019-12-11 DIAGNOSIS — R609 Edema, unspecified: Secondary | ICD-10-CM

## 2019-12-11 DIAGNOSIS — Z79899 Other long term (current) drug therapy: Secondary | ICD-10-CM | POA: Insufficient documentation

## 2019-12-11 DIAGNOSIS — I10 Essential (primary) hypertension: Secondary | ICD-10-CM | POA: Insufficient documentation

## 2019-12-11 DIAGNOSIS — R0602 Shortness of breath: Secondary | ICD-10-CM | POA: Diagnosis not present

## 2019-12-11 DIAGNOSIS — J45909 Unspecified asthma, uncomplicated: Secondary | ICD-10-CM | POA: Insufficient documentation

## 2019-12-11 DIAGNOSIS — Z87891 Personal history of nicotine dependence: Secondary | ICD-10-CM | POA: Diagnosis not present

## 2019-12-11 DIAGNOSIS — R0789 Other chest pain: Secondary | ICD-10-CM | POA: Insufficient documentation

## 2019-12-11 DIAGNOSIS — R531 Weakness: Secondary | ICD-10-CM | POA: Diagnosis present

## 2019-12-11 DIAGNOSIS — R202 Paresthesia of skin: Secondary | ICD-10-CM | POA: Insufficient documentation

## 2019-12-11 NOTE — ED Triage Notes (Signed)
Pt reports that she has been having numbness and tingling in bilateral arms since 7pm and numbness on R side of face that started at 10pm, pt also reports feet swelling that started yesterday and n/v this evening and generalized weakness. Neuro intact bilaterally. Hx of migrianes

## 2019-12-11 NOTE — ED Notes (Signed)
Original BP was 185/125 (140). Secondary BP is 176/127 (142). Pt is hypertensive. Stated she took her BP medication, but threw it up.

## 2019-12-12 ENCOUNTER — Emergency Department (HOSPITAL_COMMUNITY): Payer: Medicaid Other

## 2019-12-12 LAB — BRAIN NATRIURETIC PEPTIDE: B Natriuretic Peptide: 42.3 pg/mL (ref 0.0–100.0)

## 2019-12-12 LAB — CBC
HCT: 41.1 % (ref 36.0–46.0)
Hemoglobin: 13.6 g/dL (ref 12.0–15.0)
MCH: 30.7 pg (ref 26.0–34.0)
MCHC: 33.1 g/dL (ref 30.0–36.0)
MCV: 92.8 fL (ref 80.0–100.0)
Platelets: 315 10*3/uL (ref 150–400)
RBC: 4.43 MIL/uL (ref 3.87–5.11)
RDW: 12.7 % (ref 11.5–15.5)
WBC: 6.1 10*3/uL (ref 4.0–10.5)
nRBC: 0 % (ref 0.0–0.2)

## 2019-12-12 LAB — BASIC METABOLIC PANEL
Anion gap: 9 (ref 5–15)
BUN: 15 mg/dL (ref 6–20)
CO2: 27 mmol/L (ref 22–32)
Calcium: 9.5 mg/dL (ref 8.9–10.3)
Chloride: 103 mmol/L (ref 98–111)
Creatinine, Ser: 0.66 mg/dL (ref 0.44–1.00)
GFR, Estimated: >60 mL/min (ref >60)
Glucose, Bld: 94 mg/dL (ref 70–99)
Potassium: 4 mmol/L (ref 3.5–5.1)
Sodium: 139 mmol/L (ref 135–145)

## 2019-12-12 LAB — I-STAT BETA HCG BLOOD, ED (MC, WL, AP ONLY): I-stat hCG, quantitative: <5 m[IU]/mL (ref <5)

## 2019-12-12 LAB — TROPONIN I (HIGH SENSITIVITY): Troponin I (High Sensitivity): 7 ng/L (ref <18)

## 2019-12-12 NOTE — ED Provider Notes (Signed)
[facility] Provider Note  CSN: [id number] Arrival date & time: 12/11/19 2251    History Chief Complaint  Patient presents with  . Weakness    HPI  Krystal [name] is a 29 y.o. female with history of HTN and anxiety who is also about 3 months post-partum from c-section. She reports bilateral leg swelling for the last several days. She has had this [name] both during and after pregnancy, usually improves with elevation but has been more persistent recently. She reports yesterday afternoon, she experienced some chest tightness and SOB. Mild, not exertional. She then noted some tingling/pins and needles sensation in RUE that progressed to LUE and then to her face. She did not feel anxious or breathing fast at the time. Symptoms have improved since arrival to the ED while waiting for a room to be available.    Past Medical History:  Diagnosis Date  . Anxiety   . Asthma   . Hypertension   . IC (interstitial cystitis)   . Kidney stones   . Migraines   . UTI (urinary tract infection)     Past Surgical History:  Procedure Laterality Date  . APPENDECTOMY    . CESAREAN SECTION N/A 12/06/2017   Procedure: CESAREAN SECTION;  Surgeon: Sloan Leiter, MD;  Location: [facility];  Service: Obstetrics;  Laterality: N/A;  . 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 HAND RECONSTRUCTION Right   . INCISION AND DRAINAGE    . NASAL SEPTUM SURGERY      Family History  Problem Relation Age of Onset  . Hypertension Mother   . Hypertension Father     Social History   Tobacco Use  . Smoking status: Former Research scientist (life sciences)  . Smokeless tobacco: Never Used  Vaping Use  . Vaping Use: Never used  Substance Use Topics  . Alcohol use: Not Currently    Comment: bottle of wine a week  . Drug use: No     Home Medications Prior to Admission medications   Medication Sig Start Date End Date Taking? Authorizing Provider  acetaminophen (TYLENOL) 500 MG  tablet Take 1,000 mg by mouth every 8 (eight) hours as needed for mild pain or headache.    [provider]  albuterol (PROVENTIL HFA;VENTOLIN HFA) 108 (90 Base) MCG/ACT inhaler Inhale 1 puff into the lungs every 6 (six) hours as needed for wheezing or shortness of breath.    [provider]  amLODipine (NORVASC) 10 MG tablet TAKE ONE TABLET BY MOUTH DAILY 04/06/18   Constant, Peggy, MD  cetirizine (ZYRTEC) 10 MG tablet Take 10 mg by mouth daily.    [provider]  cyclobenzaprine (FLEXERIL) 10 MG tablet Take 1 tablet (10 mg total) by mouth every 8 (eight) hours as needed for muscle spasms. Patient not taking: Reported on 12/22/2017 10/08/17   Starr Lake, CNM  diphenhydrAMINE (BENADRYL) 25 mg capsule Take 1 capsule (25 mg total) by mouth every 6 (six) hours as needed. 10/08/17   Starr Lake, CNM  hydrochlorothiazide (HYDRODIURIL) 25 MG tablet TAKE ONE TABLET BY MOUTH DAILY 04/06/18   Constant, Peggy, MD  ibuprofen (ADVIL,MOTRIN) 600 MG tablet TAKE ONE TABLET BY MOUTH EVERY 6 HOURS AS NEEDED FOR HEADACHE, MILD PAIN OR CRAMPING 12/16/17   Constant, Peggy, MD  ketorolac (TORADOL) 10 MG tablet Take 1 tablet (10 mg total) by mouth every 6 (six) hours as needed. Patient not taking: Reported on 01/04/2018 12/18/17   [name],  [place], CNM  labetalol (NORMODYNE) 200 MG tablet Take 2 tablets (400 mg total) by mouth 3 (three) times daily. 12/11/17   Constant, Peggy, MD  omeprazole (PRILOSEC) 20 MG capsule Take 20 mg by mouth daily.    [provider]  ondansetron (ZOFRAN) 4 MG tablet Take 4 mg by mouth every 8 (eight) hours as needed for nausea or vomiting.    [provider]  oxyCODONE-acetaminophen (PERCOCET/ROXICET) 5-325 MG tablet Take 1 tablet by mouth every 6 (six) hours as needed. Patient not taking: Reported on 01/04/2018 12/18/17   [name], [place], CNM  polyethylene glycol powder (GLYCOLAX/MIRALAX) powder Take 17 g by mouth daily  as needed for moderate constipation. 12/18/17   [name], [place], [place]  Prenatal Vit-Fe Fumarate-FA (PRENATAL PO) Take 1 tablet by mouth daily.    [provider]  promethazine (PHENERGAN) 25 MG tablet Take 1 tablet (25 mg total) by mouth every 6 (six) hours as needed for nausea or vomiting. Patient not taking: Reported on 12/22/2017 07/24/17   Luvenia Redden, PA-C  sertraline (ZOLOFT) 50 MG tablet Take 1 tablet (50 mg total) by mouth daily. 01/12/18   Starr Lake, CNM     Allergies    Red dye, Compazine [prochlorperazine maleate], Other, Vicodin [hydrocodone-acetaminophen], Wheat bran, and Reglan [metoclopramide]   Review of Systems   Review of Systems A comprehensive review of systems was completed and negative except as noted in HPI.    Physical Exam BP (!) 164/97   Pulse 87   Temp 97.8 F (36.6 C) (Oral)   Resp 17   Ht 5\' 11"  (1.803 m)   Wt 108.9 kg   SpO2 97%   BMI 33.47 kg/m   Physical Exam Vitals and nursing note reviewed.  Constitutional:      Appearance: Normal appearance.  HENT:     Head: Normocephalic and atraumatic.     Nose: Nose normal.     Mouth/Throat:     Mouth: Mucous membranes are moist.  Eyes:     Extraocular Movements: Extraocular movements intact.     Conjunctiva/sclera: Conjunctivae normal.  Cardiovascular:     Rate and Rhythm: Normal rate.  Pulmonary:     Effort: Pulmonary effort is normal.     Breath sounds: Normal breath sounds.  Abdominal:     General: Abdomen is flat.     Palpations: Abdomen is soft.     Tenderness: There is no abdominal tenderness.  Musculoskeletal:        General: No swelling. Normal range of motion.     Cervical back: Neck supple.     Right lower leg: Edema (mild) present.     Left lower leg: Edema (mild) present.     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  Neurological:     General: No focal deficit present.     Mental Status: She is alert and oriented to person, place, and time.     Cranial Nerves: No cranial nerve deficit.     Sensory: No sensory deficit.     Motor: No weakness.  Psychiatric:        Mood and Affect: Mood normal.      ED Results / Procedures / Treatments   Labs (all labs ordered are listed, but only abnormal results are displayed) Labs Reviewed  BASIC METABOLIC PANEL  CBC  BRAIN NATRIURETIC PEPTIDE  URINALYSIS, ROUTINE W REFLEX MICROSCOPIC  CBG MONITORING, ED  I-STAT BETA HCG BLOOD, ED ([place], [facility], [facility] ONLY)  TROPONIN I (HIGH SENSITIVITY)  TROPONIN I (HIGH SENSITIVITY)    EKG EKG Interpretation  Date/Time:  Sunday December 11 2019 23:32:53 EDT Ventricular Rate:  88 PR Interval:  144 QRS Duration: 110 QT Interval:  384 QTC Calculation: 464 R Axis:   19 Text Interpretation: Normal sinus rhythm Normal ECG When compared with ECG of 10/08/2017, No significant change was found Confirmed by Delora Fuel ([id number]) on 12/12/2019 12:41:29 AM   Radiology No results found.  Procedures Procedures  Medications Ordered in the ED Medications - No data to display   MDM Rules/Calculators/A&P MDM Labs and EKG ordered in triage are normal. Trop, BNP and CXR added to complete evaluation of chest tightness/SOB. She otherwise looks well and is non-toxic. BP is mildly elevated but has not had her morning meds and no signs of EOD on initial labs.  ED Course  I have reviewed the triage vital signs and the nursing notes.  Pertinent labs & imaging results that were available during my care of the patient were reviewed by me and considered in my medical decision making (see chart for details).  Clinical Course as of Dec 11 1024  Mon Dec 12, 2019  0954 CXR results not crossing into Epic. From PACS:  IMPRESSION: No active cardiopulmonary disease.   [CS]  1022 Patient's  Trop and BNP are normal. Trop was drawn >12 hours since symptom onset and does not need to be repeated.    [CS]  7858 Patient remains asymptomatic and non toxic. She was advised that her paresthesias have many potential causes, but no concern for stroke. She was advised to get compression stockings for her peripheral edema. Follow up with PCP for further evaluation.    [CS]    Clinical Course User Index [CS] Truddie Hidden, MD    Final Clinical Impression(s) / ED Diagnoses Final diagnoses:  Paresthesia  Peripheral edema    Rx / DC Orders ED Discharge Orders    None       Truddie Hidden, MD 12/12/19 1026

## 2019-12-12 NOTE — ED Notes (Signed)
Patient transported to X-ray 

## 2020-05-08 ENCOUNTER — Emergency Department (HOSPITAL_COMMUNITY): Payer: 59

## 2020-05-08 ENCOUNTER — Other Ambulatory Visit: Payer: Self-pay

## 2020-05-08 ENCOUNTER — Encounter (HOSPITAL_COMMUNITY): Payer: Self-pay

## 2020-05-08 ENCOUNTER — Inpatient Hospital Stay (HOSPITAL_COMMUNITY)
Admission: EM | Admit: 2020-05-08 | Discharge: 2020-05-14 | DRG: 419 | Disposition: A | Discharge status: Home / Self Care | Payer: 59 | Attending: Internal Medicine | Admitting: Internal Medicine

## 2020-05-08 DIAGNOSIS — M797 Fibromyalgia: Secondary | ICD-10-CM | POA: Diagnosis present

## 2020-05-08 DIAGNOSIS — Z8249 Family history of ischemic heart disease and other diseases of the circulatory system: Secondary | ICD-10-CM

## 2020-05-08 DIAGNOSIS — Z9102 Food additives allergy status: Secondary | ICD-10-CM

## 2020-05-08 DIAGNOSIS — J45909 Unspecified asthma, uncomplicated: Secondary | ICD-10-CM

## 2020-05-08 DIAGNOSIS — R109 Unspecified abdominal pain: Secondary | ICD-10-CM

## 2020-05-08 DIAGNOSIS — Z888 Allergy status to other drugs, medicaments and biological substances status: Secondary | ICD-10-CM | POA: Diagnosis not present

## 2020-05-08 DIAGNOSIS — Z885 Allergy status to narcotic agent status: Secondary | ICD-10-CM | POA: Diagnosis not present

## 2020-05-08 DIAGNOSIS — I119 Hypertensive heart disease without heart failure: Secondary | ICD-10-CM | POA: Diagnosis present

## 2020-05-08 DIAGNOSIS — Z793 Long term (current) use of hormonal contraceptives: Secondary | ICD-10-CM | POA: Diagnosis not present

## 2020-05-08 DIAGNOSIS — Z87891 Personal history of nicotine dependence: Secondary | ICD-10-CM

## 2020-05-08 DIAGNOSIS — Z6836 Body mass index (BMI) 36.0-36.9, adult: Secondary | ICD-10-CM | POA: Diagnosis not present

## 2020-05-08 DIAGNOSIS — Z8613 Personal history of malaria: Secondary | ICD-10-CM | POA: Diagnosis not present

## 2020-05-08 DIAGNOSIS — K2970 Gastritis, unspecified, without bleeding: Secondary | ICD-10-CM | POA: Diagnosis present

## 2020-05-08 DIAGNOSIS — K319 Disease of stomach and duodenum, unspecified: Secondary | ICD-10-CM | POA: Diagnosis present

## 2020-05-08 DIAGNOSIS — F419 Anxiety disorder, unspecified: Secondary | ICD-10-CM | POA: Diagnosis present

## 2020-05-08 DIAGNOSIS — Z419 Encounter for procedure for purposes other than remedying health state, unspecified: Secondary | ICD-10-CM

## 2020-05-08 DIAGNOSIS — Z89112 Acquired absence of left hand: Secondary | ICD-10-CM

## 2020-05-08 DIAGNOSIS — E876 Hypokalemia: Secondary | ICD-10-CM

## 2020-05-08 DIAGNOSIS — K810 Acute cholecystitis: Secondary | ICD-10-CM

## 2020-05-08 DIAGNOSIS — K811 Chronic cholecystitis: Secondary | ICD-10-CM | POA: Diagnosis present

## 2020-05-08 DIAGNOSIS — Z8611 Personal history of tuberculosis: Secondary | ICD-10-CM | POA: Diagnosis not present

## 2020-05-08 DIAGNOSIS — R1011 Right upper quadrant pain: Secondary | ICD-10-CM | POA: Diagnosis present

## 2020-05-08 DIAGNOSIS — K760 Fatty (change of) liver, not elsewhere classified: Secondary | ICD-10-CM | POA: Diagnosis present

## 2020-05-08 DIAGNOSIS — Z79899 Other long term (current) drug therapy: Secondary | ICD-10-CM

## 2020-05-08 DIAGNOSIS — I10 Essential (primary) hypertension: Secondary | ICD-10-CM

## 2020-05-08 DIAGNOSIS — S68412A Complete traumatic amputation of left hand at wrist level, initial encounter: Secondary | ICD-10-CM | POA: Insufficient documentation

## 2020-05-08 DIAGNOSIS — Z20822 Contact with and (suspected) exposure to covid-19: Secondary | ICD-10-CM | POA: Diagnosis present

## 2020-05-08 DIAGNOSIS — Z91018 Allergy to other foods: Secondary | ICD-10-CM | POA: Diagnosis not present

## 2020-05-08 DIAGNOSIS — F5101 Primary insomnia: Secondary | ICD-10-CM | POA: Diagnosis present

## 2020-05-08 DIAGNOSIS — E669 Obesity, unspecified: Secondary | ICD-10-CM | POA: Diagnosis present

## 2020-05-08 DIAGNOSIS — F32A Depression, unspecified: Secondary | ICD-10-CM | POA: Diagnosis present

## 2020-05-08 DIAGNOSIS — I160 Hypertensive urgency: Secondary | ICD-10-CM | POA: Diagnosis not present

## 2020-05-08 LAB — CBC WITH DIFFERENTIAL/PLATELET
Abs Immature Granulocytes: 0.02 10*3/uL (ref 0.00–0.07)
Basophils Absolute: 0.1 10*3/uL (ref 0.0–0.1)
Basophils Relative: 1 %
Eosinophils Absolute: 0.1 10*3/uL (ref 0.0–0.5)
Eosinophils Relative: 2 %
HCT: 41.8 % (ref 36.0–46.0)
Hemoglobin: 14.4 g/dL (ref 12.0–15.0)
Immature Granulocytes: 0 %
Lymphocytes Relative: 27 %
Lymphs Abs: 1.9 10*3/uL (ref 0.7–4.0)
MCH: 31.8 pg (ref 26.0–34.0)
MCHC: 34.4 g/dL (ref 30.0–36.0)
MCV: 92.3 fL (ref 80.0–100.0)
Monocytes Absolute: 0.6 10*3/uL (ref 0.1–1.0)
Monocytes Relative: 9 %
Neutro Abs: 4.3 10*3/uL (ref 1.7–7.7)
Neutrophils Relative %: 61 %
Platelets: 326 10*3/uL (ref 150–400)
RBC: 4.53 MIL/uL (ref 3.87–5.11)
RDW: 12.4 % (ref 11.5–15.5)
WBC: 7.1 10*3/uL (ref 4.0–10.5)
nRBC: 0 % (ref 0.0–0.2)

## 2020-05-08 LAB — COMPREHENSIVE METABOLIC PANEL
ALT: 29 U/L (ref 0–44)
AST: 22 U/L (ref 15–41)
Albumin: 4.2 g/dL (ref 3.5–5.0)
Alkaline Phosphatase: 55 U/L (ref 38–126)
Anion gap: 8 (ref 5–15)
BUN: 12 mg/dL (ref 6–20)
CO2: 26 mmol/L (ref 22–32)
Calcium: 8.6 mg/dL — ABNORMAL LOW (ref 8.9–10.3)
Chloride: 108 mmol/L (ref 98–111)
Creatinine, Ser: 0.79 mg/dL (ref 0.44–1.00)
GFR, Estimated: >60 mL/min (ref >60)
Glucose, Bld: 96 mg/dL (ref 70–99)
Potassium: 3.3 mmol/L — ABNORMAL LOW (ref 3.5–5.1)
Sodium: 142 mmol/L (ref 135–145)
Total Bilirubin: 0.7 mg/dL (ref 0.3–1.2)
Total Protein: 6.7 g/dL (ref 6.5–8.1)

## 2020-05-08 LAB — URINALYSIS, ROUTINE W REFLEX MICROSCOPIC
Bilirubin Urine: NEGATIVE
Glucose, UA: NEGATIVE mg/dL
Ketones, ur: NEGATIVE mg/dL
Leukocytes,Ua: NEGATIVE
Nitrite: NEGATIVE
Protein, ur: 30 mg/dL — AB
RBC / HPF: >50 RBC/hpf — ABNORMAL HIGH (ref 0–5)
Specific Gravity, Urine: 1.014 (ref 1.005–1.030)
pH: 7 (ref 5.0–8.0)

## 2020-05-08 LAB — RESP PANEL BY RT-PCR (FLU A&B, COVID) ARPGX2
Influenza A by PCR: NEGATIVE
Influenza B by PCR: NEGATIVE
SARS Coronavirus 2 by RT PCR: NEGATIVE

## 2020-05-08 LAB — LIPASE, BLOOD: Lipase: 26 U/L (ref 11–51)

## 2020-05-08 LAB — TROPONIN I (HIGH SENSITIVITY)
Troponin I (High Sensitivity): 7 ng/L (ref <18)
Troponin I (High Sensitivity): 7 ng/L (ref <18)

## 2020-05-08 LAB — POC URINE PREG, ED: Preg Test, Ur: NEGATIVE

## 2020-05-08 LAB — MAGNESIUM: Magnesium: 2 mg/dL (ref 1.7–2.4)

## 2020-05-08 MED ORDER — SODIUM CHLORIDE 0.9% FLUSH
3.0000 mL | Freq: Two times a day (BID) | INTRAVENOUS | Status: DC
  Administered 2020-05-09 – 2020-05-13 (×6): 3 mL via INTRAVENOUS

## 2020-05-08 MED ORDER — POTASSIUM CHLORIDE 10 MEQ/100ML IV SOLN
10.0000 meq | Freq: Once | INTRAVENOUS | Status: AC
  Administered 2020-05-08: 10 meq via INTRAVENOUS
  Filled 2020-05-08: qty 100

## 2020-05-08 MED ORDER — ONDANSETRON HCL 4 MG/2ML IJ SOLN
2.0000 mg | Freq: Once | INTRAMUSCULAR | Status: AC
  Administered 2020-05-08: 2 mg via INTRAVENOUS
  Filled 2020-05-08: qty 2

## 2020-05-08 MED ORDER — ENOXAPARIN SODIUM 60 MG/0.6ML ~~LOC~~ SOLN
60.0000 mg | SUBCUTANEOUS | Status: DC
  Administered 2020-05-08: 60 mg via SUBCUTANEOUS
  Filled 2020-05-08 (×2): qty 0.6

## 2020-05-08 MED ORDER — HYDROMORPHONE HCL 1 MG/ML IJ SOLN
1.0000 mg | INTRAMUSCULAR | Status: DC | PRN
  Administered 2020-05-08 – 2020-05-12 (×18): 1 mg via INTRAVENOUS
  Filled 2020-05-08 (×19): qty 1

## 2020-05-08 MED ORDER — HYDROMORPHONE HCL 1 MG/ML IJ SOLN: 0.5000 mg | Freq: Once | INTRAMUSCULAR | Status: DC

## 2020-05-08 MED ORDER — LIDOCAINE VISCOUS HCL 2 % MT SOLN
15.0000 mL | Freq: Once | OROMUCOSAL | Status: AC
  Administered 2020-05-08: 15 mL via ORAL
  Filled 2020-05-08: qty 15

## 2020-05-08 MED ORDER — IPRATROPIUM-ALBUTEROL 0.5-2.5 (3) MG/3ML IN SOLN: 3.0000 mL | Freq: Four times a day (QID) | RESPIRATORY_TRACT | Status: DC | PRN

## 2020-05-08 MED ORDER — LORAZEPAM 2 MG/ML IJ SOLN
0.5000 mg | Freq: Once | INTRAMUSCULAR | Status: AC
  Administered 2020-05-08: 0.5 mg via INTRAVENOUS
  Filled 2020-05-08: qty 1

## 2020-05-08 MED ORDER — HYDROMORPHONE HCL 1 MG/ML IJ SOLN
1.0000 mg | Freq: Once | INTRAMUSCULAR | Status: AC
  Administered 2020-05-08: 1 mg via INTRAVENOUS
  Filled 2020-05-08: qty 1

## 2020-05-08 MED ORDER — LABETALOL HCL 5 MG/ML IV SOLN
40.0000 mg | Freq: Once | INTRAVENOUS | Status: AC
  Administered 2020-05-08: 40 mg via INTRAVENOUS
  Filled 2020-05-08: qty 8

## 2020-05-08 MED ORDER — PANTOPRAZOLE SODIUM 40 MG IV SOLR
40.0000 mg | Freq: Two times a day (BID) | INTRAVENOUS | Status: DC
  Administered 2020-05-08 – 2020-05-13 (×10): 40 mg via INTRAVENOUS
  Filled 2020-05-08 (×10): qty 40

## 2020-05-08 MED ORDER — LACTATED RINGERS IV SOLN: INTRAVENOUS | Status: AC

## 2020-05-08 MED ORDER — PANTOPRAZOLE SODIUM 40 MG IV SOLR
40.0000 mg | Freq: Once | INTRAVENOUS | Status: AC
  Administered 2020-05-08: 40 mg via INTRAVENOUS
  Filled 2020-05-08: qty 40

## 2020-05-08 MED ORDER — SODIUM CHLORIDE 0.9 % IV BOLUS
1000.0000 mL | Freq: Once | INTRAVENOUS | Status: AC
  Administered 2020-05-08: 1000 mL via INTRAVENOUS

## 2020-05-08 MED ORDER — SODIUM CHLORIDE 0.9 % IV SOLN
12.5000 mg | Freq: Four times a day (QID) | INTRAVENOUS | Status: AC | PRN
  Administered 2020-05-10 – 2020-05-11 (×2): 12.5 mg via INTRAVENOUS
  Filled 2020-05-08 (×4): qty 0.5

## 2020-05-08 MED ORDER — ONDANSETRON HCL 4 MG/2ML IJ SOLN
4.0000 mg | Freq: Four times a day (QID) | INTRAMUSCULAR | Status: DC | PRN
  Administered 2020-05-08 – 2020-05-14 (×10): 4 mg via INTRAVENOUS
  Filled 2020-05-08 (×10): qty 2

## 2020-05-08 MED ORDER — HYDRALAZINE HCL 20 MG/ML IJ SOLN
5.0000 mg | Freq: Once | INTRAMUSCULAR | Status: AC
  Administered 2020-05-08: 5 mg via INTRAVENOUS
  Filled 2020-05-08: qty 1

## 2020-05-08 MED ORDER — HYDRALAZINE HCL 20 MG/ML IJ SOLN
10.0000 mg | INTRAMUSCULAR | Status: DC | PRN
  Administered 2020-05-08 – 2020-05-12 (×4): 10 mg via INTRAVENOUS
  Filled 2020-05-08 (×4): qty 1

## 2020-05-08 MED ORDER — IOHEXOL 350 MG/ML SOLN
100.0000 mL | Freq: Once | INTRAVENOUS | Status: AC | PRN
  Administered 2020-05-08: 100 mL via INTRAVENOUS

## 2020-05-08 MED ORDER — POTASSIUM CHLORIDE 10 MEQ/100ML IV SOLN
10.0000 meq | INTRAVENOUS | Status: AC
  Administered 2020-05-08: 10 meq via INTRAVENOUS
  Filled 2020-05-08: qty 100

## 2020-05-08 MED ORDER — FAMOTIDINE IN NACL 20-0.9 MG/50ML-% IV SOLN
20.0000 mg | Freq: Once | INTRAVENOUS | Status: AC
  Administered 2020-05-08: 20 mg via INTRAVENOUS
  Filled 2020-05-08: qty 50

## 2020-05-08 MED ORDER — ONDANSETRON HCL 4 MG/2ML IJ SOLN: 4.0000 mg | Freq: Once | INTRAMUSCULAR | Status: DC

## 2020-05-08 MED ORDER — HYDRALAZINE HCL 20 MG/ML IJ SOLN
10.0000 mg | Freq: Four times a day (QID) | INTRAMUSCULAR | Status: DC | PRN
  Administered 2020-05-08: 10 mg via INTRAVENOUS
  Filled 2020-05-08: qty 1

## 2020-05-08 MED ORDER — FENTANYL CITRATE (PF) 100 MCG/2ML IJ SOLN
50.0000 ug | Freq: Once | INTRAMUSCULAR | Status: AC
  Administered 2020-05-08: 50 ug via INTRAVENOUS
  Filled 2020-05-08: qty 2

## 2020-05-08 MED ORDER — ALUM & MAG HYDROXIDE-SIMETH 200-200-20 MG/5ML PO SUSP
30.0000 mL | Freq: Once | ORAL | Status: AC
  Administered 2020-05-08: 30 mL via ORAL
  Filled 2020-05-08: qty 30

## 2020-05-08 NOTE — ED Provider Notes (Signed)
[facility] DEPT Provider Note   CSN: [id number] Arrival date & time: 05/08/20  6384     History Chief Complaint  Patient presents with  . Abdominal Pain    Krystal [name] is a 30 y.o. female history of obesity, migraines, kidney stone disease, hypertension, interstitial cystitis, asthma, appendectomy, left hand amputation, HELLP syndrome.  Patient presents today for right upper quadrant abdominal pain she reports that during her pregnancy she had chronic right upper quadrant abdominal pain which she believed to be secondary to gallstones.  Patient reports that since giving birth around 7 months ago her pain had improved.  Over the last week though her pain has returned she describes intermittent sharp pain of the right upper quadrant has been severe occasionally radiates to her back no alleviating factors worsened with eating.  She reports multiple episodes of nonbloody emesis over the past 2 days.  Patient reports he has been unable to eat or drink in the past day secondary to her pain and vomiting she reports that she has not taken her labetalol or hydralazine today.  Denies fever/chills, fall/injury, chest pain/shortness of breath, cough, dysuria/hematuria, extremity swelling/color change or any additional concerns. HPI     Past Medical History:  Diagnosis Date  . Anxiety   . Asthma   . Hypertension   . IC (interstitial cystitis)   . Kidney stones   . Migraines   . UTI (urinary tract infection)     Patient Active Problem List   Diagnosis Date Noted  . Nexplanon insertion 01/19/2018  . 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 APPENDECTOMY    . CESAREAN SECTION N/A 12/06/2017   Procedure: CESAREAN SECTION;  Surgeon: Sloan Leiter, MD;  Location: [facility];  Service: Obstetrics;  Laterality: N/A;  .  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 HAND RECONSTRUCTION Right   . INCISION AND DRAINAGE    . NASAL SEPTUM SURGERY       OB History    Gravida  1   Para  1   Term      Preterm  1   AB      Living  1     SAB      IAB      Ectopic      Multiple  0   Live Births  1           Family History  Problem Relation Age of Onset  . Hypertension Mother   . Hypertension Father     Social History   Tobacco Use  . Smoking status: Former Research scientist (life sciences)  . Smokeless tobacco: Never Used  Vaping Use  . Vaping Use: Never used  Substance Use Topics  . Alcohol use: Yes    Comment: bottle of wine a week-occ  . Drug use: No    Home Medications Prior to Admission medications   Medication Sig Start Date End Date Taking? Authorizing Provider  acetaminophen (TYLENOL) 500 MG tablet Take 1,000 mg by mouth every 8 (eight) hours as needed for mild pain or headache.   Yes [provider]  albuterol (PROVENTIL HFA;VENTOLIN HFA) 108 (90 Base) MCG/ACT inhaler Inhale 1 puff into the lungs every 6 (six) hours as needed for wheezing or shortness of breath.   Yes [provider]  ALPRAZolam Duanne Moron) 1 MG tablet Take  0.5-1 mg by mouth 2 (two) times daily as needed for anxiety. 03/05/20  Yes [provider]  amLODipine (NORVASC) 10 MG tablet TAKE ONE TABLET BY MOUTH DAILY Patient taking differently: Take 10 mg by mouth daily. 04/06/18  Yes Constant, Peggy, MD  cetirizine (ZYRTEC) 10 MG tablet Take 10 mg by mouth daily.   Yes [provider]  ketorolac (TORADOL) 10 MG tablet Take 1 tablet (10 mg total) by mouth every 6 (six) hours as needed. Patient taking differently: Take 10 mg by mouth every 6 (six) hours as needed for moderate pain. 12/18/17  Yes Smith, [place], CNM  labetalol (NORMODYNE) 200 MG tablet Take 2 tablets (400 mg total) by mouth 3 (three) times daily. Patient taking differently: Take 400 mg by mouth 2 (two) times daily. 12/11/17  Yes  Constant, Peggy, MD  omeprazole (PRILOSEC) 20 MG capsule Take 20 mg by mouth daily.   Yes [provider]  Prenatal Vit-Fe Fumarate-FA (PRENATAL PO) Take 1 tablet by mouth daily.   Yes [provider]  sertraline (ZOLOFT) 50 MG tablet Take 1 tablet (50 mg total) by mouth daily. 01/12/18  Yes Starr Lake, CNM  cyclobenzaprine (FLEXERIL) 10 MG tablet Take 1 tablet (10 mg total) by mouth every 8 (eight) hours as needed for muscle spasms. Patient not taking: Reported on 05/08/2020 10/08/17   Starr Lake, CNM  diphenhydrAMINE (BENADRYL) 25 mg capsule Take 1 capsule (25 mg total) by mouth every 6 (six) hours as needed. Patient not taking: Reported on 05/08/2020 10/08/17   Starr Lake, CNM  hydrochlorothiazide (HYDRODIURIL) 25 MG tablet TAKE ONE TABLET BY MOUTH DAILY Patient not taking: Reported on 05/08/2020 04/06/18   Constant, Peggy, MD  ibuprofen (ADVIL,MOTRIN) 600 MG tablet TAKE ONE TABLET BY MOUTH EVERY 6 HOURS AS NEEDED FOR HEADACHE, MILD PAIN OR CRAMPING Patient not taking: Reported on 05/08/2020 12/16/17   Constant, Peggy, MD  oxyCODONE-acetaminophen (PERCOCET/ROXICET) 5-325 MG tablet Take 1 tablet by mouth every 6 (six) hours as needed. Patient not taking: No sig reported 12/18/17   [name], [place], CNM  polyethylene glycol powder (GLYCOLAX/MIRALAX) powder Take 17 g by mouth daily as needed for moderate constipation. Patient not taking: Reported on 05/08/2020 12/18/17   [name], [place], CNM  promethazine (PHENERGAN) 25 MG tablet Take 1 tablet (25 mg total) by mouth every 6 (six) hours as needed for nausea or vomiting. Patient not taking: No sig reported 07/24/17   Luvenia Redden, PA-C    Allergies    Red dye, Compazine [prochlorperazine maleate], Other, Vicodin [hydrocodone-acetaminophen], Wheat bran, Amitriptyline hcl, Duloxetine hcl, Reglan [metoclopramide], Labetalol, and Nifedipine  Review of Systems   Review of Systems Ten systems  are reviewed and are negative for acute change except as noted in the HPI  Physical Exam Updated Vital Signs BP (!) 185/110   Pulse 65   Temp 98.4 F (36.9 C) (Oral)   Resp 16   Ht 5\' 11"  (1.803 m)   Wt 117.9 kg   SpO2 98%   BMI 36.26 kg/m   Physical Exam Constitutional:      General: She is not in acute distress.    Appearance: Normal appearance. She is well-developed. She is not ill-appearing or diaphoretic.  HENT:     Head: Normocephalic and atraumatic.  Eyes:     General: Vision grossly intact. Gaze aligned appropriately.     Pupils: Pupils are equal, round, and reactive to light.  Neck:     Trachea: Trachea and phonation normal.  Cardiovascular:     Pulses:          Dorsalis pedis pulses are 2+ on the right side and 2+ on the left side.  Pulmonary:     Effort: Pulmonary effort is normal. No respiratory distress.  Abdominal:     General: There is no distension.     Palpations: Abdomen is soft.     Tenderness: There is abdominal tenderness in the right upper quadrant. There is guarding. There is no rebound. Positive signs include Murphy's sign.  Musculoskeletal:        General: Normal range of motion.     Cervical back: Normal range of motion.  Feet:     Right foot:     Protective Sensation: 3 sites tested. 3 sites sensed.     Left foot:     Protective Sensation: 3 sites tested. 3 sites sensed.  Skin:    General: Skin is warm and dry.  Neurological:     Mental Status: She is alert.     GCS: GCS eye subscore is 4. GCS verbal subscore is 5. GCS motor subscore is 6.     Comments: Speech is clear and goal oriented, follows commands Major Cranial nerves without deficit, no facial droop Moves extremities without ataxia, coordination intact  Psychiatric:        Behavior: Behavior normal.     ED Results / Procedures / Treatments   Labs (all labs ordered are listed, but only abnormal results are displayed) Labs Reviewed  COMPREHENSIVE METABOLIC PANEL - Abnormal;  Notable for the following components:      Result Value   Potassium 3.3 (*)    Calcium 8.6 (*)    All other components within normal limits  URINALYSIS, ROUTINE W REFLEX MICROSCOPIC - Abnormal; Notable for the following components:   Hgb urine dipstick MODERATE (*)    Protein, ur 30 (*)    RBC / HPF >50 (*)    Bacteria, UA RARE (*)    All other components within normal limits  RESP PANEL BY RT-PCR (FLU A&B, COVID) ARPGX2  CBC WITH DIFFERENTIAL/PLATELET  LIPASE, BLOOD  POC URINE PREG, ED  TROPONIN I (HIGH SENSITIVITY)  TROPONIN I (HIGH SENSITIVITY)    EKG None  Radiology CT Angio Chest/Abd/Pel for Dissection W and/or Wo Contrast  Result Date: 05/08/2020 CLINICAL DATA:  Abdominal pain, aortic dissection suspected. Right upper quadrant pain on going for weeks. EXAM: CT ANGIOGRAPHY CHEST, ABDOMEN AND PELVIS TECHNIQUE: Non-contrast CT of the chest was initially obtained. Multidetector CT imaging through the chest, abdomen and pelvis was performed using the standard protocol during bolus administration of intravenous contrast. Multiplanar reconstructed images and MIPs were obtained and reviewed to evaluate the vascular anatomy. CONTRAST:  175mL OMNIPAQUE IOHEXOL 350 MG/ML SOLN COMPARISON:  Right upper quadrant ultrasound May 08, 2020 and CT abdomen pelvis October 12, 2015. FINDINGS: CTA CHEST FINDINGS Cardiovascular: No evidence of intramural hematoma on precontrast imaging. Preferential opacification of the thoracic aorta. No evidence of thoracic aortic aneurysm or dissection. Normal heart size. No pericardial effusion. Mediastinum/Nodes: No enlarged mediastinal, hilar, or axillary lymph nodes. Thyroid gland, trachea, and esophagus demonstrate no significant findings. Lungs/Pleura: Lungs are clear. No pleural effusion or pneumothorax. Musculoskeletal: No chest wall abnormality. No acute or significant osseous findings. Review of the MIP images confirms the above findings. CTA ABDOMEN AND PELVIS  FINDINGS VASCULAR Aorta: Normal caliber aorta without aneurysm, dissection, vasculitis or significant stenosis. Celiac: Patent without evidence of aneurysm, dissection, vasculitis or significant stenosis. SMA:  Patent without evidence of aneurysm, dissection, vasculitis or significant stenosis. Renals: Both renal arteries are patent without evidence of aneurysm, dissection, vasculitis, fibromuscular dysplasia or significant stenosis. IMA: Patent without evidence of aneurysm, dissection, vasculitis or significant stenosis. Inflow: Patent without evidence of aneurysm, dissection, vasculitis or significant stenosis. Veins: No obvious venous abnormality within the limitations of this arterial phase study. Review of the MIP images confirms the above findings. NON-VASCULAR Hepatobiliary: No focal liver abnormality is seen. No gallstones, gallbladder wall thickening, or biliary dilatation. Pancreas: Unremarkable. No pancreatic ductal dilatation or surrounding inflammatory changes. Spleen: Lobular splenic contour with benign-appearing capsular calcifications laterally, similar to prior and likely sequela prior trauma or infection. Adrenals/Urinary Tract: Adrenal glands are unremarkable. Kidneys are normal, without renal calculi, focal lesion, or hydronephrosis. Bladder is unremarkable. Stomach/Bowel: Stomach is within normal limits. No evidence of bowel wall thickening, distention, or inflammatory changes. Lymphatic: No pathologically enlarged lymph nodes. Reproductive: Uterus and bilateral adnexa are unremarkable. Other: No abdominopelvic ascites. Musculoskeletal: Mild lower lumbar spondylosis. Unchanged size the well-circumscribed 2.9 cm lytic lesion in the left proximal femur which demonstrates stippled internal minimal as matrix, most consistent with a benign enchondroma. Review of the MIP images confirms the above findings. IMPRESSION: 1. No evidence of acute aortic syndrome. 2. No acute findings in the chest, abdomen  or pelvis. 3. Gallbladder appears unremarkable. Electronically Signed   By: Dahlia Bailiff MD   On: 05/08/2020 12:55   US Abdomen Limited RUQ (LIVER/GB)  Result Date: 05/08/2020 CLINICAL DATA:  Right upper quadrant pain with nausea and vomiting. EXAM: ULTRASOUND ABDOMEN LIMITED RIGHT UPPER QUADRANT COMPARISON:  Right upper quadrant ultrasound 12/06/2017 FINDINGS: Gallbladder: No gallstones or wall thickening visualized. No sonographic Murphy sign noted by sonographer. Common bile duct: Diameter: 3 mm Liver: No focal lesion identified. Diffusely increased parenchymal echogenicity. Portal vein is patent on color Doppler imaging with normal direction of blood flow towards the liver. Other: None. IMPRESSION: The echogenicity of the liver is increased. This is a nonspecific finding but is most commonly seen with fatty infiltration of the liver, which in the setting of steatohepatitis can be a cause of abdominal pain. There are no obvious focal liver lesions. Gallbladder is unremarkable. Electronically Signed   By: Dahlia Bailiff MD   On: 05/08/2020 11:00    Procedures Procedures   Medications Ordered in ED Medications  pantoprazole (PROTONIX) injection 40 mg (has no administration in time range)  alum & mag hydroxide-simeth (MAALOX/MYLANTA) 200-200-20 MG/5ML suspension 30 mL (has no administration in time range)    And  lidocaine (XYLOCAINE) 2 % viscous mouth solution 15 mL (has no administration in time range)  famotidine (PEPCID) IVPB 20 mg premix (has no administration in time range)  fentaNYL (SUBLIMAZE) injection 50 mcg (50 mcg Intravenous Given 05/08/20 1040)  hydrALAZINE (APRESOLINE) injection 5 mg (5 mg Intravenous Given 05/08/20 1041)  sodium chloride 0.9 % bolus 1,000 mL (1,000 mLs Intravenous Bolus 05/08/20 1043)  labetalol (NORMODYNE) injection 40 mg (40 mg Intravenous Given 05/08/20 1154)  HYDROmorphone (DILAUDID) injection 1 mg (1 mg Intravenous Given 05/08/20 1153)  iohexol (OMNIPAQUE) 350  MG/ML injection 100 mL (100 mLs Intravenous Contrast Given 05/08/20 1213)  HYDROmorphone (DILAUDID) injection 1 mg (1 mg Intravenous Given 05/08/20 1456)    ED Course  I have reviewed the triage vital signs and the nursing notes.  Pertinent labs & imaging results that were available during my care of the patient were reviewed by me and considered in my medical decision making (see chart  for details).  Clinical Course as of 05/08/20 1505  Tue May 08, 2020  1446 Anderson [place] [facility] [BM]    Clinical Course User Index [BM] Gari Crown   MDM Rules/Calculators/A&P                         Additional history obtained from: 1. Nursing notes from this visit. 2. Electronic medical records. ----------- 30 year old female presented for right upper quadrant abdominal pain that she reports started during her pregnancy. Improved for the past 7 months since giving birth but worsening over the last week.  She reports severe pain over the past 2 days of multiple episodes of emesis unable tolerate p.o. in the past day.  Patient has right upper quadrant tenderness guarding and a positive Murphy sign on arrival.  She is also noted to be hypertensive with blood pressure 186/137, has not taken her blood pressure medication hydralazine or labetalol today.  Will obtain abdominal pain labs and right upper quadrant ultrasound for further evaluation.  Given patient's hypertension we will also obtain EKG and give her a dose of hydralazine as well as pain medication.  Discussed with Dr. Roderic Palau who agrees, 5 mg IV hydralazine ordered for blood pressure control.   ---- CBC within normal limits, no leukocytosis suggest infection, no anemia or thrombocytopenia. CMP shows no emergent electrolyte derangement, AKI, LFT elevations or gap.  Mild hypokalemia of 3.3 likely secondary to emesis. Urinalysis shows hemoglobin, red blood cells and rare bacteria.  Patient currently menstruating SPECT this is the cause.  Patient  without urinary symptoms doubt UTI. Lipase normal limits, doubt pancreatitis. Urine pregnancy test negative.  RUQ [place]:    IMPRESSION:  The echogenicity of the liver is increased. This is a nonspecific  finding but is most commonly seen with fatty infiltration of the  liver, which in the setting of steatohepatitis can be a cause of  abdominal pain. There are no obvious focal liver lesions.    Gallbladder is unremarkable.  EKG reviewed with Dr. Roderic Palau shows no acute ischemic changes. - Patient reassessed she reports pain has worsened since earlier evaluation no improvement with fentanyl.  Additionally she reports pain is now radiating up into her chest and her blood pressure is increased since receiving hydralazine at around 818 systolic.  Given new chest pain and worsening blood pressure will obtain CT angio chest abdomen and pelvis dissection study for further evaluation along with troponin.  Discussed plan with Dr. Roderic Palau who agrees, additionally will give patient 40 mg IV labetalol for blood pressure control and additional pain medication. - High-sensitivity troponin within normal limits. CT Dissection Study Chest/AP:  IMPRESSION:  1. No evidence of acute aortic syndrome.  2. No acute findings in the chest, abdomen or pelvis.  3. Gallbladder appears unremarkable.  - Patient reassessed she is uncomfortable nauseous in room blood pressure has improved following labetalol.  Patient seen and evaluated by attending physician Dr. Roderic Palau.  Possible this is related to peptic ulcer disease.  Protonix, Pepcid and GI cocktail ordered.  I consulted with [facility] gastroenterology Alcide Goodness at 2:46 PM.  They agree with medicine admission and will be by to see the patient today.  Patient states understanding of care plan she is agreeable for admission.  Plan to consult medicine team for observation admission given intractable abdominal pain as well as hypertension.  Delta troponin and Covid/influenza  panel pending. -------------- 3:05 AM: Consult with Dr. Neysa Bonito, patient was accepted to medicine service.  3:07 PM: Consult with [facility] gastroenterology, Dr. Paulita Fujita advises they will be by to see patient tomorrow .  Note: Portions of this report may have been transcribed using voice recognition software. Every effort was made to ensure accuracy; however, inadvertent computerized transcription errors may still be present. Final Clinical Impression(s) / ED Diagnoses Final diagnoses:  RUQ abdominal pain  Intractable abdominal pain  Hypertension, unspecified type    Rx / DC Orders ED Discharge Orders    None       Gari Crown 05/08/20 1508    Milton Ferguson, MD 05/12/20 1536

## 2020-05-08 NOTE — H&P (Signed)
History and Physical        Hospital Admission Note Date: 05/08/2020  Patient name: Krystal [name] Medical record number: [id number] Date of birth: 08/17/1990 Age: 30 y.o. Gender: female  PCP: Maurice Small, MD   Chief Complaint    Chief Complaint  Patient presents with  . Abdominal Pain      HPI:   This is a 30 year old female with past medical history of obesity, migraines, kidney stones, hypertension, interstitial cystitis, asthma, appendectomy, necrotizing fasciitis s/p left hand amputation, HELLP syndrome who presented to the ED with RUQ abdominal pain.  She has been dealing with intermittent RUQ abdominal pain since her pregnancy but had improved after giving birth 7 months ago.  Since then it has been intermittent however over the past week it has acutely worsened.  Described as sharp and occasionally radiating to her back without any alleviating factors.  Worse with eating but also occurs without eating.  Pain wakes her up in the middle the night and in the morning with vomiting.  Has associated sweats and chills but no fevers.  Nobody sick at home.  Has had diarrhea and constipation but no blood in stool or vomit.  Denies any personal or family history of ulcerative colitis or Crohn's disease.  Associated with nonbloody emesis x2 days and unable to eat anything and has not taken her BP meds today.   ED Course: Afebrile, hypertensive, on room air. Labs unremarkable overall. Notable Imaging: RUQ US-suspected fatty liver, gallbladder unremarkable.  CTA chest/abdomen/pelvis for dissection unremarkable. Patient received GI cocktail, fentanyl, hydralazine, Dilaudid, labetalol, Protonix.    Vitals:   05/08/20 1500 05/08/20 1607  BP: (!) 185/110 (!) 187/104  Pulse: 65   Resp: 16   Temp:    SpO2: 98%      Review of Systems:  Review of Systems  All other systems reviewed  and are negative.   Medical/Social/Family History   Past Medical History: Past Medical History:  Diagnosis Date  . Anxiety   . Asthma   . Hypertension   . IC (interstitial cystitis)   . Kidney stones   . Migraines   . UTI (urinary tract infection)     Past Surgical History:  Procedure Laterality Date  . APPENDECTOMY    . CESAREAN SECTION N/A 12/06/2017   Procedure: CESAREAN SECTION;  Surgeon: Sloan Leiter, MD;  Location: [facility];  Service: Obstetrics;  Laterality: N/A;  . 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 HAND RECONSTRUCTION Right   . INCISION AND DRAINAGE    . NASAL SEPTUM SURGERY      Medications: Prior to Admission medications   Medication Sig Start Date End Date Taking? Authorizing Provider  acetaminophen (TYLENOL) 500 MG tablet Take 1,000 mg by mouth every 8 (eight) hours as needed for mild pain or headache.   Yes [provider]  albuterol (PROVENTIL HFA;VENTOLIN HFA) 108 (90 Base) MCG/ACT inhaler Inhale 1 puff into the lungs every 6 (six) hours as needed for wheezing or shortness of breath.   Yes [provider]  amLODipine (NORVASC) 10 MG tablet TAKE ONE TABLET BY MOUTH DAILY Patient taking differently: Take 10 mg by mouth daily.  04/06/18  Yes Constant, Peggy, MD  cetirizine (ZYRTEC) 10 MG tablet Take 10 mg by mouth daily.   Yes [provider]  ketorolac (TORADOL) 10 MG tablet Take 1 tablet (10 mg total) by mouth every 6 (six) hours as needed. Patient taking differently: Take 10 mg by mouth every 6 (six) hours as needed for moderate pain. 12/18/17  Yes Smith, [place], CNM  labetalol (NORMODYNE) 200 MG tablet Take 2 tablets (400 mg total) by mouth 3 (three) times daily. Patient taking differently: Take 400 mg by mouth 2 (two) times daily. 12/11/17  Yes Constant, Peggy, MD  omeprazole (PRILOSEC) 20 MG capsule Take 20 mg by mouth daily.   Yes [provider]  Prenatal Vit-Fe Fumarate-FA (PRENATAL PO)  Take 1 tablet by mouth daily.   Yes [provider]  sertraline (ZOLOFT) 50 MG tablet Take 1 tablet (50 mg total) by mouth daily. 01/12/18  Yes Starr Lake, CNM  cyclobenzaprine (FLEXERIL) 10 MG tablet Take 1 tablet (10 mg total) by mouth every 8 (eight) hours as needed for muscle spasms. Patient not taking: Reported on 05/08/2020 10/08/17   Starr Lake, CNM  diphenhydrAMINE (BENADRYL) 25 mg capsule Take 1 capsule (25 mg total) by mouth every 6 (six) hours as needed. Patient not taking: Reported on 05/08/2020 10/08/17   Starr Lake, CNM  hydrochlorothiazide (HYDRODIURIL) 25 MG tablet TAKE ONE TABLET BY MOUTH DAILY Patient not taking: Reported on 05/08/2020 04/06/18   Constant, Peggy, MD  ibuprofen (ADVIL,MOTRIN) 600 MG tablet TAKE ONE TABLET BY MOUTH EVERY 6 HOURS AS NEEDED FOR HEADACHE, MILD PAIN OR CRAMPING Patient not taking: Reported on 05/08/2020 12/16/17   Constant, Peggy, MD  oxyCODONE-acetaminophen (PERCOCET/ROXICET) 5-325 MG tablet Take 1 tablet by mouth every 6 (six) hours as needed. Patient not taking: No sig reported 12/18/17   [name], [place], CNM  polyethylene glycol powder (GLYCOLAX/MIRALAX) powder Take 17 g by mouth daily as needed for moderate constipation. Patient not taking: Reported on 05/08/2020 12/18/17   [name], [place], CNM  promethazine (PHENERGAN) 25 MG tablet Take 1 tablet (25 mg total) by mouth every 6 (six) hours as needed for nausea or vomiting. Patient not taking: No sig reported 07/24/17   Luvenia Redden, PA-C    Allergies:   Allergies  Allergen Reactions  . Red Dye Other (See Comments)    ALL CAUSE PROBLEMS #40 IS WORST-MIGRAINES   . Compazine [Prochlorperazine Maleate] Other (See Comments)    TWITCHING, CANT STAY STILL. Pt states she can tolerate promethazine  . Other Other (See Comments)    FANSIDAR FOR MALARIA-ASTHMA  . Vicodin [Hydrocodone-Acetaminophen] Itching and Nausea And Vomiting    Pt states she  can tolerate acetaminophen  . Wheat Bran Other (See Comments)    GLUTEN SENSITIVE  . Amitriptyline Hcl     Other reaction(s): angry moods  . Duloxetine Hcl     Other reaction(s): passing out, trimble, irritable  . Reglan [Metoclopramide] Other (See Comments)    restless  . Labetalol Nausea Only  . Nifedipine Nausea And Vomiting    Discussed with patient and maternal fetal medicine, patient has intolerance but not frank allergy.    Social History:  reports that she has quit smoking. She has never used smokeless tobacco. She reports current alcohol use. She reports that she does not use drugs.  Family History: Family History  Problem Relation Age of Onset  . Hypertension Mother   . Hypertension Father      Objective   Physical  Exam: Blood pressure (!) 187/104, pulse 65, temperature 98.4 F (36.9 C), temperature source Oral, resp. rate 16, height 5\' 11"  (1.803 m), weight 117.9 kg, SpO2 98 %, unknown if currently breastfeeding.  Physical Exam Vitals and nursing note reviewed.  Constitutional:      Comments: Patient appears in significant pain and is tearful. At times shivering  Cardiovascular:     Rate and Rhythm: Normal rate and regular rhythm.  Pulmonary:     Breath sounds: Normal breath sounds.     Comments: tachypnea from pain Abdominal:     General: Abdomen is flat. Bowel sounds are decreased.     Palpations: Abdomen is soft.  Musculoskeletal:     Right lower leg: No edema.     Left lower leg: No edema.     Comments: S/p LUE amputation RUE with healed surgical scars  Skin:    Coloration: Skin is not jaundiced or pale.  Neurological:     Mental Status: She is alert. Mental status is at baseline.  Psychiatric:        Mood and Affect: Affect is tearful.     LABS on Admission: I have personally reviewed all the labs and imaging below    Basic Metabolic Panel: Recent Labs  Lab 05/08/20 1024  NA 142  K 3.3*  CL 108  CO2 26  GLUCOSE 96  BUN 12   CREATININE 0.79  CALCIUM 8.6*   Liver Function Tests: Recent Labs  Lab 05/08/20 1024  AST 22  ALT 29  ALKPHOS 55  BILITOT 0.7  PROT 6.7  ALBUMIN 4.2   Recent Labs  Lab 05/08/20 1024  LIPASE 26   No results for input(s): AMMONIA in the last 168 hours. CBC: Recent Labs  Lab 05/08/20 1024  WBC 7.1  NEUTROABS 4.3  HGB 14.4  HCT 41.8  MCV 92.3  PLT 326   Cardiac Enzymes: No results for input(s): CKTOTAL, CKMB, CKMBINDEX, TROPONINI in the last 168 hours. BNP: Invalid input(s): POCBNP CBG: No results for input(s): GLUCAP in the last 168 hours.  Radiological Exams on Admission:  CT Angio Chest/Abd/Pel for Dissection W and/or Wo Contrast  Result Date: 05/08/2020 CLINICAL DATA:  Abdominal pain, aortic dissection suspected. Right upper quadrant pain on going for weeks. EXAM: CT ANGIOGRAPHY CHEST, ABDOMEN AND PELVIS TECHNIQUE: Non-contrast CT of the chest was initially obtained. Multidetector CT imaging through the chest, abdomen and pelvis was performed using the standard protocol during bolus administration of intravenous contrast. Multiplanar reconstructed images and MIPs were obtained and reviewed to evaluate the vascular anatomy. CONTRAST:  191mL OMNIPAQUE IOHEXOL 350 MG/ML SOLN COMPARISON:  Right upper quadrant ultrasound May 08, 2020 and CT abdomen pelvis October 12, 2015. FINDINGS: CTA CHEST FINDINGS Cardiovascular: No evidence of intramural hematoma on precontrast imaging. Preferential opacification of the thoracic aorta. No evidence of thoracic aortic aneurysm or dissection. Normal heart size. No pericardial effusion. Mediastinum/Nodes: No enlarged mediastinal, hilar, or axillary lymph nodes. Thyroid gland, trachea, and esophagus demonstrate no significant findings. Lungs/Pleura: Lungs are clear. No pleural effusion or pneumothorax. Musculoskeletal: No chest wall abnormality. No acute or significant osseous findings. Review of the MIP images confirms the above findings. CTA  ABDOMEN AND PELVIS FINDINGS VASCULAR Aorta: Normal caliber aorta without aneurysm, dissection, vasculitis or significant stenosis. Celiac: Patent without evidence of aneurysm, dissection, vasculitis or significant stenosis. SMA: Patent without evidence of aneurysm, dissection, vasculitis or significant stenosis. Renals: Both renal arteries are patent without evidence of aneurysm, dissection, vasculitis, fibromuscular dysplasia or significant stenosis.  IMA: Patent without evidence of aneurysm, dissection, vasculitis or significant stenosis. Inflow: Patent without evidence of aneurysm, dissection, vasculitis or significant stenosis. Veins: No obvious venous abnormality within the limitations of this arterial phase study. Review of the MIP images confirms the above findings. NON-VASCULAR Hepatobiliary: No focal liver abnormality is seen. No gallstones, gallbladder wall thickening, or biliary dilatation. Pancreas: Unremarkable. No pancreatic ductal dilatation or surrounding inflammatory changes. Spleen: Lobular splenic contour with benign-appearing capsular calcifications laterally, similar to prior and likely sequela prior trauma or infection. Adrenals/Urinary Tract: Adrenal glands are unremarkable. Kidneys are normal, without renal calculi, focal lesion, or hydronephrosis. Bladder is unremarkable. Stomach/Bowel: Stomach is within normal limits. No evidence of bowel wall thickening, distention, or inflammatory changes. Lymphatic: No pathologically enlarged lymph nodes. Reproductive: Uterus and bilateral adnexa are unremarkable. Other: No abdominopelvic ascites. Musculoskeletal: Mild lower lumbar spondylosis. Unchanged size the well-circumscribed 2.9 cm lytic lesion in the left proximal femur which demonstrates stippled internal minimal as matrix, most consistent with a benign enchondroma. Review of the MIP images confirms the above findings. IMPRESSION: 1. No evidence of acute aortic syndrome. 2. No acute findings in  the chest, abdomen or pelvis. 3. Gallbladder appears unremarkable. Electronically Signed   By: Dahlia Bailiff MD   On: 05/08/2020 12:55   US Abdomen Limited RUQ (LIVER/GB)  Result Date: 05/08/2020 CLINICAL DATA:  Right upper quadrant pain with nausea and vomiting. EXAM: ULTRASOUND ABDOMEN LIMITED RIGHT UPPER QUADRANT COMPARISON:  Right upper quadrant ultrasound 12/06/2017 FINDINGS: Gallbladder: No gallstones or wall thickening visualized. No sonographic Murphy sign noted by sonographer. Common bile duct: Diameter: 3 mm Liver: No focal lesion identified. Diffusely increased parenchymal echogenicity. Portal vein is patent on color Doppler imaging with normal direction of blood flow towards the liver. Other: None. IMPRESSION: The echogenicity of the liver is increased. This is a nonspecific finding but is most commonly seen with fatty infiltration of the liver, which in the setting of steatohepatitis can be a cause of abdominal pain. There are no obvious focal liver lesions. Gallbladder is unremarkable. Electronically Signed   By: Dahlia Bailiff MD   On: 05/08/2020 11:00      EKG: normal EKG, normal sinus rhythm   A & P   Principal Problem:   Intractable right upper quadrant abdominal pain Active Problems:   Asthma   Hypokalemia   Hypertensive urgency   1. Acute on chronic/Intractable RUQ abdominal pain with nausea/vomiting of unclear etiology a. In severe pain at bedside which brings her to tears b. Will continue Dilaudid 1 mg Q2h PRN c. Protonix 40 mg IV BID in case PUD d. CTA chest/abd/pelvis unremarkable e. [place] with suspected fatty liver, possibly steatohepatitis? LFTs not elevated  f. GI consulted, appreciate recommendations g. NPO  2. Hypertensive Urgency a. Antihypertensives at home: amlodipine 10 mg, hydralazine 25 mg TID, HCTZ 25 mg QD, Labetaol 400 mg TID b. Has not taken her BP meds today and likely worsened by pain c. Only had Labetalol 40 mg x 1 and hydralazine 5 mg x 1 in  the ED d. Given her HR in the low 60s will hold further beta blocker and put on hydralazine 10 mg Q6h PRN and treat underlying pain.  e. Will give a dose of ativan as anxiety is likely playing a role f. If BP is unable to be controlled she may need to transfer to SDU for a drip, possibly Cardene g. Would recommend looking for secondary causes of HTN at some point, once she is stable  3. Asthma, not in exacerbation a. PRN albuterol  4. Hypokalemia a. Replete IV    DVT prophylaxis: lovenox   Code Status: Full Code  Diet: NPO Family Communication: Admission, patients condition and plan of care including tests being ordered have been discussed with the patient who indicates understanding and agrees with the plan and Code Status.  Disposition Plan: The appropriate patient status for this patient is INPATIENT. Inpatient status is judged to be reasonable and necessary in order to provide the required intensity of service to ensure the patient's safety. The patient's presenting symptoms, physical exam findings, and initial radiographic and laboratory data in the context of their chronic comorbidities is felt to place them at high risk for further clinical deterioration. Furthermore, it is not anticipated that the patient will be medically stable for discharge from the hospital within 2 midnights of admission. The following factors support the patient status of inpatient.   " The patient's presenting symptoms include RUQ abdominal pain, nausea/vomiting. " The worrisome physical exam findings include severe RUQ pain, hypertension. " The initial radiographic and laboratory data are worrisome because of fatty liver. " The chronic co-morbidities include as above.   * I certify that at the point of admission it is my clinical judgment that the patient will require inpatient hospital care spanning beyond 2 midnights from the point of admission due to high intensity of service, high risk for further  deterioration and high frequency of surveillance required.*    The medical decision making on this patient was of high complexity and the patient is at high risk for clinical deterioration, therefore this is a level 3  admission.  Consultants  . GI  Procedures  . none  Time Spent on Admission: 76 minutes    Harold Hedge, DO [facility]  05/08/2020, 4:15 PM

## 2020-05-08 NOTE — ED Triage Notes (Signed)
Pt arrives [facility] from PCP. Pt is 7 months post partum and has been having RUQ pain ongoing for weeks. Pt additionally reports n/v. PCP concerned for gall bladder issues. Pt reports she has been unable to keep BP medication down. EMS administered 134mcg fentanyl IV for pain and 4mg  Zofran for N/V

## 2020-05-08 NOTE — ED Notes (Signed)
Pt actively vomiting at this time. MD aware.

## 2020-05-08 NOTE — ED Notes (Signed)
Pt resting in bed at this time. NAD noted. No complaints voiced. Pt updated on plan of care

## 2020-05-08 NOTE — Plan of Care (Signed)

## 2020-05-08 NOTE — Progress Notes (Signed)
[facility]  We were called to consult on this patient but in further review of her chart it looks like she has an [facility] primary care physician.  I did go ahead and contact [facility] and they will be seeing the patient.  Ellouise Newer, PA-C

## 2020-05-09 DIAGNOSIS — I10 Essential (primary) hypertension: Secondary | ICD-10-CM

## 2020-05-09 LAB — CBC
HCT: 44.1 % (ref 36.0–46.0)
Hemoglobin: 14.8 g/dL (ref 12.0–15.0)
MCH: 31.7 pg (ref 26.0–34.0)
MCHC: 33.6 g/dL (ref 30.0–36.0)
MCV: 94.4 fL (ref 80.0–100.0)
Platelets: 336 10*3/uL (ref 150–400)
RBC: 4.67 MIL/uL (ref 3.87–5.11)
RDW: 13 % (ref 11.5–15.5)
WBC: 9.6 10*3/uL (ref 4.0–10.5)
nRBC: 0 % (ref 0.0–0.2)

## 2020-05-09 LAB — COMPREHENSIVE METABOLIC PANEL
ALT: 28 U/L (ref 0–44)
AST: 23 U/L (ref 15–41)
Albumin: 4.3 g/dL (ref 3.5–5.0)
Alkaline Phosphatase: 58 U/L (ref 38–126)
Anion gap: 12 (ref 5–15)
BUN: 12 mg/dL (ref 6–20)
CO2: 21 mmol/L — ABNORMAL LOW (ref 22–32)
Calcium: 9.2 mg/dL (ref 8.9–10.3)
Chloride: 104 mmol/L (ref 98–111)
Creatinine, Ser: 0.73 mg/dL (ref 0.44–1.00)
GFR, Estimated: >60 mL/min (ref >60)
Glucose, Bld: 129 mg/dL — ABNORMAL HIGH (ref 70–99)
Potassium: 3.6 mmol/L (ref 3.5–5.1)
Sodium: 137 mmol/L (ref 135–145)
Total Bilirubin: 1.1 mg/dL (ref 0.3–1.2)
Total Protein: 7.4 g/dL (ref 6.5–8.1)

## 2020-05-09 LAB — HIV ANTIBODY (ROUTINE TESTING W REFLEX): HIV Screen 4th Generation wRfx: NONREACTIVE

## 2020-05-09 MED ORDER — AMLODIPINE BESYLATE 10 MG PO TABS
10.0000 mg | ORAL_TABLET | Freq: Every day | ORAL | Status: DC
  Administered 2020-05-09 – 2020-05-14 (×6): 10 mg via ORAL
  Filled 2020-05-09 (×6): qty 1

## 2020-05-09 MED ORDER — HYDRALAZINE HCL 50 MG PO TABS
50.0000 mg | ORAL_TABLET | Freq: Three times a day (TID) | ORAL | Status: DC
  Administered 2020-05-09 – 2020-05-12 (×9): 50 mg via ORAL
  Filled 2020-05-09 (×9): qty 1

## 2020-05-09 MED ORDER — LACTATED RINGERS IV SOLN: INTRAVENOUS | Status: AC

## 2020-05-09 MED ORDER — ACETAMINOPHEN 325 MG PO TABS
650.0000 mg | ORAL_TABLET | Freq: Four times a day (QID) | ORAL | Status: DC | PRN
  Administered 2020-05-09: 650 mg via ORAL
  Filled 2020-05-09: qty 2

## 2020-05-09 MED ORDER — ENOXAPARIN SODIUM 60 MG/0.6ML ~~LOC~~ SOLN
55.0000 mg | SUBCUTANEOUS | Status: DC
  Administered 2020-05-09 – 2020-05-13 (×5): 55 mg via SUBCUTANEOUS
  Filled 2020-05-09 (×5): qty 0.6
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
## 2020-05-09 NOTE — Progress Notes (Signed)
[facility] PROGRESS NOTE   Krystal [name] HRC:[id number] DOB: 10-25-1990 DOA: 05/08/2020  PCP: Maurice Small, MD  Brief History/Interval Summary: 30 year old female with past medical history of obesity, migraines, kidney stones, hypertension, interstitial cystitis, asthma, appendectomy, necrotizing fasciitis s/p left hand amputation, HELLP syndrome who presented to the ED with RUQ abdominal pain.  She has been dealing with intermittent RUQ abdominal pain since her pregnancy but had improved after giving birth 7 months ago. Since then it has been intermittent however over the past week it has acutely worsened.   Consultants: [facility]  Procedures: None yet  Antibiotics: Anti-infectives (From admission, onward)   None      Subjective/Interval History: Patient complains of right-sided abdominal pain 7 out of 10 in intensity.  Feeling nauseated.  Denies any shortness of breath or chest pain.     Assessment/Plan:  Intractable nausea and vomiting with right upper quadrant abdominal pain CTA of chest abdomen pelvis was unremarkable.  Ultrasound showed suspected fatty liver, possible steatohepatitis. GB was noted to be unremarkable.  Patient however with significant tenderness in the right upper quadrant.  LFTs are surprisingly normal.  Lipase level was normal.  Patient seen by gastroenterology.  Plan is for upper endoscopy tomorrow.  If upper endoscopy is unremarkable patient will likely need a surgical consultation as it could be her gallbladder that is responsible for her symptoms.  Accelerated hypertension Likely due to the fact that she has been unable to take her oral medications due to her nausea and vomiting along with pain issues.  Resume her amlodipine.  Increase the dose of hydralazine.  Continue to hold HCTZ and labetalol for now.  Control pain.  History of asthma Stable.  Hypokalemia Potassium is normal this morning.  Lactating Patient  delivered her child 7 months ago.  Continues to breast-feed.  Obesity Estimated body mass index is 34.38 kg/m as calculated from the following:   Height as of this encounter: 5\' 11"  (1.803 m).   Weight as of this encounter: 111.8 kg.   DVT Prophylaxis: Lovenox Code Status: Full code Family Communication: Discussed with the patient Disposition Plan: Hopefully return home when improved  Status is: Inpatient  Remains inpatient appropriate because:Ongoing active pain requiring inpatient pain management and IV treatments appropriate due to intensity of illness or inability to take PO   Dispo: The patient is from: Home              Anticipated d/c is to: Home              Patient currently is not medically stable to d/c.   Difficult to place patient No     Medications:  Scheduled: . amLODipine  10 mg Oral Daily  . enoxaparin (LOVENOX) injection  60 mg Subcutaneous Q24H  . hydrALAZINE  50 mg Oral TID  . pantoprazole (PROTONIX) IV  40 mg Intravenous Q12H  . sodium chloride flush  3 mL Intravenous Q12H   Continuous: . promethazine (PHENERGAN) injection Stopped (05/08/20 2104)   WOE:HOZYYQMGNOI, HYDROmorphone (DILAUDID) injection, ipratropium-albuterol, ondansetron (ZOFRAN) IV, promethazine (PHENERGAN) injection   Objective:  Vital Signs  Vitals:   05/08/20 1820 05/08/20 2308 05/09/20 0303 05/09/20 0708  BP: (!) 197/127 (!) 163/97 (!) 170/107 (!) 164/98  Pulse: 62 79 85 79  Resp:  20 16 20   Temp: 97.9 F (36.6 C) 98.1 F (36.7 C) 98.2 F (36.8 C) 98.3 F (36.8 C)  TempSrc: Oral Oral Oral Oral  SpO2: 98% 99% 97% 99%  Weight:      Height:        Intake/Output Summary (Last 24 hours) at 05/09/2020 1028 Last data filed at 05/09/2020 0300 Gross per 24 hour  Intake 785.25 ml  Output -  Net 785.25 ml   Filed Weights   05/08/20 1001 05/08/20 1813  Weight: 117.9 kg 111.8 kg    General appearance: Awake alert.  In no distress Resp: Clear to auscultation  bilaterally.  Normal effort Cardio: S1-S2 is normal regular.  No S3-S4.  No rubs murmurs or bruit GI: Abdomen is soft.  Tender in the right upper quadrant without any rebound rigidity or guarding.  No masses organomegaly.  Extremities: No edema.  Full range of motion of lower extremities. Neurologic: Alert and oriented x3.  No focal neurological deficits.    Lab Results:  Data Reviewed: I have personally reviewed following labs and imaging studies  CBC: Recent Labs  Lab 05/08/20 1024 05/09/20 0505  WBC 7.1 9.6  NEUTROABS 4.3  --   HGB 14.4 14.8  HCT 41.8 44.1  MCV 92.3 94.4  PLT 326 010    Basic Metabolic Panel: Recent Labs  Lab 05/08/20 1024 05/08/20 1406 05/09/20 0505  NA 142  --  137  K 3.3*  --  3.6  CL 108  --  104  CO2 26  --  21*  GLUCOSE 96  --  129*  BUN 12  --  12  CREATININE 0.79  --  0.73  CALCIUM 8.6*  --  9.2  MG  --  2.0  --     GFR: Estimated Creatinine Clearance: 140.3 mL/min (by C-G formula based on SCr of 0.73 mg/dL).  Liver Function Tests: Recent Labs  Lab 05/08/20 1024 05/09/20 0505  AST 22 23  ALT 29 28  ALKPHOS 55 58  BILITOT 0.7 1.1  PROT 6.7 7.4  ALBUMIN 4.2 4.3    Recent Labs  Lab 05/08/20 1024  LIPASE 26     Recent Results (from the past 240 hour(s))  Resp Panel by RT-PCR (Flu A&B, Covid) Nasopharyngeal Swab     Status: None   Collection Time: 05/08/20  4:19 PM   Specimen: Nasopharyngeal Swab; Nasopharyngeal(NP) swabs in vial transport medium  Result Value Ref Range Status   SARS Coronavirus 2 by RT PCR NEGATIVE NEGATIVE Final    Comment: (NOTE) SARS-CoV-2 target nucleic acids are NOT DETECTED.  The SARS-CoV-2 RNA is generally detectable in upper respiratory specimens during the acute phase of infection. The lowest concentration of SARS-CoV-2 viral copies this assay can detect is 138 copies/mL. A negative result does not preclude SARS-Cov-2 infection and should not be used as the sole basis for treatment or other  patient management decisions. A negative result may occur with  improper specimen collection/handling, submission of specimen other than nasopharyngeal swab, presence of viral mutation(s) within the areas targeted by this assay, and inadequate number of viral copies(<138 copies/mL). A negative result must be combined with clinical observations, patient history, and epidemiological information. The expected result is Negative.  Fact Sheet for Patients:  EntrepreneurPulse.com.au  Fact Sheet for Healthcare Providers:  IncredibleEmployment.be  This test is no t yet approved or cleared by the [place] FDA and  has been authorized for detection and/or diagnosis of SARS-CoV-2 by FDA under an Emergency Use Authorization (EUA). This EUA will remain  in effect (meaning this test can be used) for the duration of the COVID-19 declaration under Section 564(b)(1) of the Act, 21 U.S.C.section 360bbb-3(b)(1), unless  the authorization is terminated  or revoked sooner.       Influenza A by PCR NEGATIVE NEGATIVE Final   Influenza B by PCR NEGATIVE NEGATIVE Final    Comment: (NOTE) The Xpert Xpress SARS-CoV-2/FLU/RSV plus assay is intended as an aid in the diagnosis of influenza from Nasopharyngeal swab specimens and should not be used as a sole basis for treatment. Nasal washings and aspirates are unacceptable for Xpert Xpress SARS-CoV-2/FLU/RSV testing.  Fact Sheet for Patients: EntrepreneurPulse.com.au  Fact Sheet for Healthcare Providers: IncredibleEmployment.be  This test is not yet approved or cleared by the [place] FDA and has been authorized for detection and/or diagnosis of SARS-CoV-2 by FDA under an Emergency Use Authorization (EUA). This EUA will remain in effect (meaning this test can be used) for the duration of the COVID-19 declaration under Section 564(b)(1) of the Act, 21 U.S.C. section  360bbb-3(b)(1), unless the authorization is terminated or revoked.  Performed at [facility], [place] [address], [place], [place] [address]       Radiology Studies: CT Angio Chest/Abd/Pel for Dissection W and/or Wo Contrast  Result Date: 05/08/2020 CLINICAL DATA:  Abdominal pain, aortic dissection suspected. Right upper quadrant pain on going for weeks. EXAM: CT ANGIOGRAPHY CHEST, ABDOMEN AND PELVIS TECHNIQUE: Non-contrast CT of the chest was initially obtained. Multidetector CT imaging through the chest, abdomen and pelvis was performed using the standard protocol during bolus administration of intravenous contrast. Multiplanar reconstructed images and MIPs were obtained and reviewed to evaluate the vascular anatomy. CONTRAST:  161mL OMNIPAQUE IOHEXOL 350 MG/ML SOLN COMPARISON:  Right upper quadrant ultrasound May 08, 2020 and CT abdomen pelvis October 12, 2015. FINDINGS: CTA CHEST FINDINGS Cardiovascular: No evidence of intramural hematoma on precontrast imaging. Preferential opacification of the thoracic aorta. No evidence of thoracic aortic aneurysm or dissection. Normal heart size. No pericardial effusion. Mediastinum/Nodes: No enlarged mediastinal, hilar, or axillary lymph nodes. Thyroid gland, trachea, and esophagus demonstrate no significant findings. Lungs/Pleura: Lungs are clear. No pleural effusion or pneumothorax. Musculoskeletal: No chest wall abnormality. No acute or significant osseous findings. Review of the MIP images confirms the above findings. CTA ABDOMEN AND PELVIS FINDINGS VASCULAR Aorta: Normal caliber aorta without aneurysm, dissection, vasculitis or significant stenosis. Celiac: Patent without evidence of aneurysm, dissection, vasculitis or significant stenosis. SMA: Patent without evidence of aneurysm, dissection, vasculitis or significant stenosis. Renals: Both renal arteries are patent without evidence of aneurysm, dissection, vasculitis, fibromuscular  dysplasia or significant stenosis. IMA: Patent without evidence of aneurysm, dissection, vasculitis or significant stenosis. Inflow: Patent without evidence of aneurysm, dissection, vasculitis or significant stenosis. Veins: No obvious venous abnormality within the limitations of this arterial phase study. Review of the MIP images confirms the above findings. NON-VASCULAR Hepatobiliary: No focal liver abnormality is seen. No gallstones, gallbladder wall thickening, or biliary dilatation. Pancreas: Unremarkable. No pancreatic ductal dilatation or surrounding inflammatory changes. Spleen: Lobular splenic contour with benign-appearing capsular calcifications laterally, similar to prior and likely sequela prior trauma or infection. Adrenals/Urinary Tract: Adrenal glands are unremarkable. Kidneys are normal, without renal calculi, focal lesion, or hydronephrosis. Bladder is unremarkable. Stomach/Bowel: Stomach is within normal limits. No evidence of bowel wall thickening, distention, or inflammatory changes. Lymphatic: No pathologically enlarged lymph nodes. Reproductive: Uterus and bilateral adnexa are unremarkable. Other: No abdominopelvic ascites. Musculoskeletal: Mild lower lumbar spondylosis. Unchanged size the well-circumscribed 2.9 cm lytic lesion in the left proximal femur which demonstrates stippled internal minimal as matrix, most consistent with a benign enchondroma. Review of the MIP images confirms  the above findings. IMPRESSION: 1. No evidence of acute aortic syndrome. 2. No acute findings in the chest, abdomen or pelvis. 3. Gallbladder appears unremarkable. Electronically Signed   By: Dahlia Bailiff MD   On: 05/08/2020 12:55   US Abdomen Limited RUQ (LIVER/GB)  Result Date: 05/08/2020 CLINICAL DATA:  Right upper quadrant pain with nausea and vomiting. EXAM: ULTRASOUND ABDOMEN LIMITED RIGHT UPPER QUADRANT COMPARISON:  Right upper quadrant ultrasound 12/06/2017 FINDINGS: Gallbladder: No gallstones or  wall thickening visualized. No sonographic Murphy sign noted by sonographer. Common bile duct: Diameter: 3 mm Liver: No focal lesion identified. Diffusely increased parenchymal echogenicity. Portal vein is patent on color Doppler imaging with normal direction of blood flow towards the liver. Other: None. IMPRESSION: The echogenicity of the liver is increased. This is a nonspecific finding but is most commonly seen with fatty infiltration of the liver, which in the setting of steatohepatitis can be a cause of abdominal pain. There are no obvious focal liver lesions. Gallbladder is unremarkable. Electronically Signed   By: Dahlia Bailiff MD   On: 05/08/2020 11:00       LOS: 1 day   Bonnielee Haff  [facility] Pager on [email or web address]  05/09/2020, 10:28 AM
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
## 2020-05-10 ENCOUNTER — Encounter (HOSPITAL_COMMUNITY)
Admission: EM | Disposition: A | Discharge status: Home / Self Care | Payer: Self-pay | Source: Home / Self Care | Attending: Internal Medicine

## 2020-05-10 ENCOUNTER — Inpatient Hospital Stay (HOSPITAL_COMMUNITY): Payer: 59 | Admitting: Registered Nurse

## 2020-05-10 ENCOUNTER — Encounter (HOSPITAL_COMMUNITY): Payer: Self-pay | Admitting: Internal Medicine

## 2020-05-10 HISTORY — PX: BIOPSY: SHX5522

## 2020-05-10 HISTORY — PX: ESOPHAGOGASTRODUODENOSCOPY (EGD) WITH PROPOFOL: SHX5813

## 2020-05-10 LAB — COMPREHENSIVE METABOLIC PANEL
ALT: 25 U/L (ref 0–44)
AST: 33 U/L (ref 15–41)
Albumin: 4.3 g/dL (ref 3.5–5.0)
Alkaline Phosphatase: 56 U/L (ref 38–126)
Anion gap: 11 (ref 5–15)
BUN: 11 mg/dL (ref 6–20)
CO2: 23 mmol/L (ref 22–32)
Calcium: 9.3 mg/dL (ref 8.9–10.3)
Chloride: 104 mmol/L (ref 98–111)
Creatinine, Ser: 0.9 mg/dL (ref 0.44–1.00)
GFR, Estimated: >60 mL/min (ref >60)
Glucose, Bld: 95 mg/dL (ref 70–99)
Potassium: 4 mmol/L (ref 3.5–5.1)
Sodium: 138 mmol/L (ref 135–145)
Total Bilirubin: 1.5 mg/dL — ABNORMAL HIGH (ref 0.3–1.2)
Total Protein: 7.1 g/dL (ref 6.5–8.1)

## 2020-05-10 LAB — CBC
HCT: 45 % (ref 36.0–46.0)
Hemoglobin: 15.8 g/dL — ABNORMAL HIGH (ref 12.0–15.0)
MCH: 31.9 pg (ref 26.0–34.0)
MCHC: 35.1 g/dL (ref 30.0–36.0)
MCV: 90.7 fL (ref 80.0–100.0)
Platelets: 172 10*3/uL (ref 150–400)
RBC: 4.96 MIL/uL (ref 3.87–5.11)
RDW: 12.6 % (ref 11.5–15.5)
WBC: 7.3 10*3/uL (ref 4.0–10.5)
nRBC: 0 % (ref 0.0–0.2)

## 2020-05-10 SURGERY — ESOPHAGOGASTRODUODENOSCOPY (EGD) WITH PROPOFOL
Anesthesia: Monitor Anesthesia Care

## 2020-05-10 MED ORDER — GLYCOPYRROLATE 0.2 MG/ML IJ SOLN
INTRAMUSCULAR | Status: DC | PRN
  Administered 2020-05-10: .1 mg via INTRAVENOUS

## 2020-05-10 MED ORDER — ALPRAZOLAM 0.5 MG PO TABS
0.5000 mg | ORAL_TABLET | Freq: Two times a day (BID) | ORAL | Status: DC | PRN
  Administered 2020-05-10 – 2020-05-14 (×3): 0.5 mg via ORAL
  Filled 2020-05-10 (×3): qty 1

## 2020-05-10 MED ORDER — PROPOFOL 500 MG/50ML IV EMUL
INTRAVENOUS | Status: DC | PRN
  Administered 2020-05-10: 350 ug/kg/min via INTRAVENOUS

## 2020-05-10 MED ORDER — LABETALOL HCL 200 MG PO TABS
200.0000 mg | ORAL_TABLET | Freq: Two times a day (BID) | ORAL | Status: DC
  Administered 2020-05-10 – 2020-05-12 (×5): 200 mg via ORAL
  Filled 2020-05-10 (×6): qty 1

## 2020-05-10 MED ORDER — HYDROMORPHONE HCL 1 MG/ML IJ SOLN
INTRAMUSCULAR | Status: AC
  Filled 2020-05-10: qty 1

## 2020-05-10 MED ORDER — LIDOCAINE HCL (CARDIAC) PF 100 MG/5ML IV SOSY
PREFILLED_SYRINGE | INTRAVENOUS | Status: DC | PRN
  Administered 2020-05-10: 75 mg via INTRAVENOUS

## 2020-05-10 MED ORDER — LEVOTHYROXINE SODIUM 112 MCG PO TABS
112.0000 ug | ORAL_TABLET | Freq: Every day | ORAL | Status: DC
  Administered 2020-05-11 – 2020-05-14 (×4): 112 ug via ORAL
  Filled 2020-05-10 (×4): qty 1

## 2020-05-10 MED ORDER — SERTRALINE HCL 50 MG PO TABS
150.0000 mg | ORAL_TABLET | Freq: Every day | ORAL | Status: DC
  Administered 2020-05-10 – 2020-05-14 (×5): 150 mg via ORAL
  Filled 2020-05-10 (×5): qty 1

## 2020-05-10 MED ORDER — LACTATED RINGERS IV SOLN
INTRAVENOUS | Status: DC
  Administered 2020-05-10: 1000 mL via INTRAVENOUS

## 2020-05-10 MED ORDER — PROPOFOL 1000 MG/100ML IV EMUL
INTRAVENOUS | Status: AC
  Filled 2020-05-10: qty 100

## 2020-05-10 SURGICAL SUPPLY — 15 items

## 2020-05-10 NOTE — Anesthesia Procedure Notes (Signed)
Procedure Name: MAC Date/Time: 05/10/2020 1:48 PM Performed by: Lissa Morales, CRNA Pre-anesthesia Checklist: Patient identified, Emergency Drugs available, Timeout performed, Patient being monitored and Suction available Patient Re-evaluated:Patient Re-evaluated prior to induction Oxygen Delivery Method: Simple face mask Placement Confirmation: positive ETCO2

## 2020-05-10 NOTE — Transfer of Care (Signed)
Immediate Anesthesia Transfer of Care Note  Patient: Nigel C Yoshimura  Procedure(s) Performed: ESOPHAGOGASTRODUODENOSCOPY (EGD) WITH PROPOFOL (N/A ) BIOPSY  Patient Location: PACU  Anesthesia Type:MAC  Level of Consciousness: sedated and patient cooperative  Airway & Oxygen Therapy: Patient Spontanous Breathing and Patient connected to face mask oxygen  Post-op Assessment: Report given to RN and Post -op Vital signs reviewed and stable  Post vital signs: stable  Last Vitals:  Vitals Value Taken Time  BP    Temp    Pulse 81 05/10/20 1416  Resp 14 05/10/20 1416  SpO2 100 % 05/10/20 1416  Vitals shown include unvalidated device data.  Last Pain:  Vitals:   05/10/20 1329  TempSrc: Oral  PainSc: 7       Patients Stated Pain Goal: 4 (58/59/29 2446)  Complications: No complications documented.

## 2020-05-10 NOTE — Consult Note (Addendum)
Krystal [name] 28-Sep-1990  [id number].    Requesting MD: Dr. 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  About 2 years ago, starting during her first pregnancy, she began having epigastric abdominal pain with N/V.  These episodes started more infrequently, but have become much more frequently over the last 2 years.  She states that she would have episodes of pain lasting for several minutes to several hours at a time.  She would have bilious type emesis.  She tried protonix to no avail.  She states her pain was worse after eating greasy/fatty/spicy foods as well as alcohol which she really doesn't even drink anymore.  Her pain got a little better after delivery of her first child, but returned during her second pregnancy and has persisted after.  She is now 7 months post partum.  She complains of gas and belching during these episodes.  They do wake her up from sleep with N/V as well.  Over the last week, her pain has become more consistent and worse.  She presented to the [facility] where she had a negative [place] and a CT scan that was otherwise unremarkable as well.  Her labs were all normal as well.  She was seen by GI and underwent a EGD today which revealed mild gastritis but no other significant findings to really explain her pain.  We have been asked to see her today for evaluation for her gallbladder.  ROS: ROS: Please see HPI, otherwise she states she has intermittent LE edema, she is breast feeding.  All other systems are currently  negative.  Family History  Problem Relation Age of Onset  . Hypertension Mother   . Hypertension Father     Past Medical History:  Diagnosis Date  . Anxiety   . Asthma   . Hypertension   . IC (interstitial cystitis)   . Kidney stones   . Migraines   . UTI (urinary tract infection)     Past Surgical History:  Procedure Laterality Date  . APPENDECTOMY    . CESAREAN SECTION N/A 12/06/2017   Procedure: CESAREAN SECTION;  Surgeon: Sloan Leiter, MD;  Location: [facility];  Service: Obstetrics;  Laterality: N/A;  . 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 HAND RECONSTRUCTION Right   . INCISION AND DRAINAGE    . NASAL SEPTUM SURGERY      Social History:  reports that she has never smoked. She has never used smokeless tobacco. She reports current alcohol use. She reports that she does not use drugs.  Allergies:  Allergies  Allergen Reactions  . Red Dye Other (See Comments)    ALL CAUSE PROBLEMS #40 IS WORST-MIGRAINES   . Compazine [Prochlorperazine Maleate] Other (See Comments)    TWITCHING, CANT STAY STILL. Pt states she can tolerate promethazine  . Other Other (See Comments)    FANSIDAR FOR MALARIA-ASTHMA  . Vicodin [Hydrocodone-Acetaminophen] Itching and Nausea And Vomiting    Pt states she can tolerate acetaminophen  . Wheat Bran  Other (See Comments)    GLUTEN SENSITIVE  . Amitriptyline Hcl     Other reaction(s): angry moods  . Duloxetine Hcl     Other reaction(s): passing out, trimble, irritable  . Reglan [Metoclopramide] Other (See Comments)    restless  . Nifedipine Nausea And Vomiting    Discussed with patient and maternal fetal medicine, patient has intolerance but not frank allergy.    Medications Prior to Admission  Medication Sig Dispense Refill  . acetaminophen (TYLENOL) 500 MG tablet Take 1,000 mg by mouth every 8 (eight) hours as needed for mild pain or headache.    . albuterol (PROVENTIL HFA;VENTOLIN HFA) 108 (90 Base) MCG/ACT  inhaler Inhale 1 puff into the lungs every 6 (six) hours as needed for wheezing or shortness of breath.    . ALPRAZolam (XANAX) 1 MG tablet Take 0.5-1 mg by mouth 2 (two) times daily as needed for anxiety.    Marland Kitchen amLODipine (NORVASC) 10 MG tablet TAKE ONE TABLET BY MOUTH DAILY (Patient taking differently: Take 10 mg by mouth daily.) 30 tablet 2  . cetirizine (ZYRTEC) 10 MG tablet Take 10 mg by mouth daily.    . hydrALAZINE (APRESOLINE) 25 MG tablet Take 25 mg by mouth 3 (three) times daily.    Marland Kitchen ketorolac (TORADOL) 10 MG tablet Take 1 tablet (10 mg total) by mouth every 6 (six) hours as needed. (Patient taking differently: Take 10 mg by mouth every 6 (six) hours as needed for moderate pain.) 20 tablet 0  . labetalol (NORMODYNE) 200 MG tablet Take 2 tablets (400 mg total) by mouth 3 (three) times daily. (Patient taking differently: Take 400 mg by mouth 2 (two) times daily.) 90 tablet 3  . levothyroxine (SYNTHROID) 112 MCG tablet Take 112 mcg by mouth daily before breakfast.    . pantoprazole (PROTONIX) 40 MG tablet Take 40 mg by mouth daily.    . Prenatal Vit-Fe Fumarate-FA (PRENATAL PO) Take 1 tablet by mouth daily.    . sertraline (ZOLOFT) 100 MG tablet Take 150 mg by mouth daily.    . traMADol (ULTRAM) 50 MG tablet Take 50 mg by mouth 2 (two) times daily as needed for severe pain.    . cyclobenzaprine (FLEXERIL) 10 MG tablet Take 1 tablet (10 mg total) by mouth every 8 (eight) hours as needed for muscle spasms. (Patient not taking: Reported on 05/08/2020) 30 tablet 1  . diphenhydrAMINE (BENADRYL) 25 mg capsule Take 1 capsule (25 mg total) by mouth every 6 (six) hours as needed. (Patient not taking: Reported on 05/08/2020) 30 capsule 0  . hydrochlorothiazide (HYDRODIURIL) 25 MG tablet TAKE ONE TABLET BY MOUTH DAILY (Patient not taking: No sig reported) 30 tablet 2  . ibuprofen (ADVIL,MOTRIN) 600 MG tablet TAKE ONE TABLET BY MOUTH EVERY 6 HOURS AS NEEDED FOR HEADACHE, MILD PAIN OR CRAMPING (Patient not  taking: Reported on 05/08/2020) 30 tablet 0  . oxyCODONE-acetaminophen (PERCOCET/ROXICET) 5-325 MG tablet Take 1 tablet by mouth every 6 (six) hours as needed. (Patient not taking: No sig reported) 15 tablet 0  . polyethylene glycol powder (GLYCOLAX/MIRALAX) powder Take 17 g by mouth daily as needed for moderate constipation. (Patient not taking: No sig reported) 500 g 2  . promethazine (PHENERGAN) 25 MG tablet Take 1 tablet (25 mg total) by mouth every 6 (six) hours as needed for nausea or vomiting. (Patient not taking: No sig reported) 30 tablet 0  . sertraline (ZOLOFT) 50 MG tablet Take 1 tablet (50 mg total) by mouth daily. (Patient  not taking: No sig reported) 30 tablet 2     Physical Exam: Blood pressure (!) 171/95, pulse 78, temperature 97.8 F (36.6 C), temperature source Oral, resp. rate 14, height 5\' 11"  (1.803 m), weight 111.8 kg, SpO2 96 %, unknown if currently breastfeeding. General: pleasant, WD, WN, mildly obese white female who is laying in bed in NAD HEENT: head is normocephalic, atraumatic.  Sclera are noninjected.  PERRL.  Ears and nose without any masses or lesions.  Mouth is pink and moist Heart: regular, rate, and rhythm.  Normal s1,s2. No obvious murmurs, gallops, or rubs noted.  Palpable pedal pulses bilaterally and radial pulse on right side Lungs: CTAB, no wheezes, rhonchi, or rales noted.  Respiratory effort nonlabored Abd: soft, she is quite tender in her epigastrium, minimal in RUQ, no peritonitis, ND, +BS, no masses, hernias, or organomegaly,. Scars from prior surgeries noted. MS: all 4 extremities are symmetrical with no cyanosis, clubbing, or edema, except LUE as she has a hand amputation and RUE with scars from reconstructive surgery. Skin: warm and dry with no masses, lesions, or rashes Neuro: Cranial nerves 2-12 grossly intact, sensation is normal throughout Psych: A&Ox3 with an appropriate affect.   Results for orders placed or performed during the hospital  encounter of 05/08/20 (from the past 48 hour(s))  Resp Panel by RT-PCR (Flu A&B, Covid) Nasopharyngeal Swab     Status: None   Collection Time: 05/08/20  4:19 PM   Specimen: Nasopharyngeal Swab; Nasopharyngeal(NP) swabs in vial transport medium  Result Value Ref Range   SARS Coronavirus 2 by RT PCR NEGATIVE NEGATIVE    Comment: (NOTE) SARS-CoV-2 target nucleic acids are NOT DETECTED.  The SARS-CoV-2 RNA is generally detectable in upper respiratory specimens during the acute phase of infection. The lowest concentration of SARS-CoV-2 viral copies this assay can detect is 138 copies/mL. A negative result does not preclude SARS-Cov-2 infection and should not be used as the sole basis for treatment or other patient management decisions. A negative result may occur with  improper specimen collection/handling, submission of specimen other than nasopharyngeal swab, presence of viral mutation(s) within the areas targeted by this assay, and inadequate number of viral copies(<138 copies/mL). A negative result must be combined with clinical observations, patient history, and epidemiological information. The expected result is Negative.  Fact Sheet for Patients:  EntrepreneurPulse.com.au  Fact Sheet for Healthcare Providers:  IncredibleEmployment.be  This test is no t yet approved or cleared by the [place] FDA and  has been authorized for detection and/or diagnosis of SARS-CoV-2 by FDA under an Emergency Use Authorization (EUA). This EUA will remain  in effect (meaning this test can be used) for the duration of the COVID-19 declaration under Section 564(b)(1) of the Act, 21 U.S.C.section 360bbb-3(b)(1), unless the authorization is terminated  or revoked sooner.       Influenza A by PCR NEGATIVE NEGATIVE   Influenza B by PCR NEGATIVE NEGATIVE    Comment: (NOTE) The Xpert Xpress SARS-CoV-2/FLU/RSV plus assay is intended as an aid in the diagnosis of  influenza from Nasopharyngeal swab specimens and should not be used as a sole basis for treatment. Nasal washings and aspirates are unacceptable for Xpert Xpress SARS-CoV-2/FLU/RSV testing.  Fact Sheet for Patients: EntrepreneurPulse.com.au  Fact Sheet for Healthcare Providers: IncredibleEmployment.be  This test is not yet approved or cleared by the [place] FDA and has been authorized for detection and/or diagnosis of SARS-CoV-2 by FDA under an Emergency Use Authorization (EUA). This EUA will remain in  effect (meaning this test can be used) for the duration of the COVID-19 declaration under Section 564(b)(1) of the Act, 21 U.S.C. section 360bbb-3(b)(1), unless the authorization is terminated or revoked.  Performed at [facility], [place] [address], [place], [place] [address]   HIV Antibody (routine testing w rflx)     Status: None   Collection Time: 05/08/20  7:05 PM  Result Value Ref Range   HIV Screen 4th Generation wRfx Non Reactive Non Reactive    Comment: Performed at [facility], [place] [address], [place], [place] [address]  CBC     Status: None   Collection Time: 05/09/20  5:05 AM  Result Value Ref Range   WBC 9.6 4.0 - 10.5 K/uL   RBC 4.67 3.87 - 5.11 MIL/uL   Hemoglobin 14.8 12.0 - 15.0 g/dL   HCT 44.1 36.0 - 46.0 %   MCV 94.4 80.0 - 100.0 fL   MCH 31.7 26.0 - 34.0 pg   MCHC 33.6 30.0 - 36.0 g/dL   RDW 13.0 11.5 - 15.5 %   Platelets 336 150 - 400 K/uL   nRBC 0.0 0.0 - 0.2 %    Comment: Performed at [facility], [place] [address], [place], [place] [address]  Comprehensive metabolic panel     Status: Abnormal   Collection Time: 05/09/20  5:05 AM  Result Value Ref Range   Sodium 137 135 - 145 mmol/L   Potassium 3.6 3.5 - 5.1 mmol/L   Chloride 104 98 - 111 mmol/L   CO2 21 (L) 22 - 32 mmol/L   Glucose, Bld 129 (H) 70 - 99 mg/dL    Comment: Glucose reference range applies only to  samples taken after fasting for at least 8 hours.   BUN 12 6 - 20 mg/dL   Creatinine, Ser 0.73 0.44 - 1.00 mg/dL   Calcium 9.2 8.9 - 10.3 mg/dL   Total Protein 7.4 6.5 - 8.1 g/dL   Albumin 4.3 3.5 - 5.0 g/dL   AST 23 15 - 41 U/L   ALT 28 0 - 44 U/L   Alkaline Phosphatase 58 38 - 126 U/L   Total Bilirubin 1.1 0.3 - 1.2 mg/dL   GFR, Estimated >60 >60 mL/min    Comment: (NOTE) Calculated using the CKD-EPI Creatinine Equation (2021)    Anion gap 12 5 - 15    Comment: Performed at [facility], [place] [address], [place], [place] [address]  CBC     Status: Abnormal   Collection Time: 05/10/20  5:58 AM  Result Value Ref Range   WBC 7.3 4.0 - 10.5 K/uL   RBC 4.96 3.87 - 5.11 MIL/uL   Hemoglobin 15.8 (H) 12.0 - 15.0 g/dL   HCT 45.0 36.0 - 46.0 %   MCV 90.7 80.0 - 100.0 fL   MCH 31.9 26.0 - 34.0 pg   MCHC 35.1 30.0 - 36.0 g/dL   RDW 12.6 11.5 - 15.5 %   Platelets 172 150 - 400 K/uL   nRBC 0.0 0.0 - 0.2 %    Comment: Performed at [facility], [place] [address], [place], [place] [address]  Comprehensive metabolic panel     Status: Abnormal   Collection Time: 05/10/20  5:58 AM  Result Value Ref Range   Sodium 138 135 - 145 mmol/L   Potassium 4.0 3.5 - 5.1 mmol/L   Chloride 104 98 - 111 mmol/L   CO2 23 22 - 32 mmol/L  Glucose, Bld 95 70 - 99 mg/dL    Comment: Glucose reference range applies only to samples taken after fasting for at least 8 hours.   BUN 11 6 - 20 mg/dL   Creatinine, Ser 0.90 0.44 - 1.00 mg/dL   Calcium 9.3 8.9 - 10.3 mg/dL   Total Protein 7.1 6.5 - 8.1 g/dL   Albumin 4.3 3.5 - 5.0 g/dL   AST 33 15 - 41 U/L   ALT 25 0 - 44 U/L   Alkaline Phosphatase 56 38 - 126 U/L   Total Bilirubin 1.5 (H) 0.3 - 1.2 mg/dL   GFR, Estimated >60 >60 mL/min    Comment: (NOTE) Calculated using the CKD-EPI Creatinine Equation (2021)    Anion gap 11 5 - 15    Comment: Performed at [facility], [place] [address] [place], [place]  [address]   No results found.    Assessment/Plan Asthma HTN Migraines Anxiety Breast feeding  Abdominal pain The patient has a negative US/CT scan in regards to her gallbladder.  No other acute intra-abdominal abnormalities were found either.  She underwent an EGD today with no acute findings.  Her symptoms have been present for at least 2 years and started with her pregnancies.  Many of her symptoms are c/w biliary disease despite no evidence of cholecystitis or gallstones.  Dyskinesia typically doesn't cause pain like this, although I have seen it before.  We will order a HIDA scan with an EF to further evaluate this.  We discussed the possibility of lap chole pending these results and that this can sometimes fix the pain but sometimes does not fix the pain.  She understands this.  She will be NPO p MN and no narcotics after MN for HIDA tomorrow.  We will continue to follow.  FEN - CLD/NPO p MN VTE - Lovenox ID - none currently  Henreitta Cea, [facility] 05/10/2020, 3:56 PM Please see Amion for pager number during day hours 7:00am-4:30pm or 7:00am -11:30am on weekends

## 2020-05-10 NOTE — Progress Notes (Signed)
[facility] PROGRESS NOTE   Krystal [name] VZD:[id number] DOB: 12-26-1990 DOA: 05/08/2020  PCP: Maurice Small, MD  Brief History/Interval Summary: 30 year old female with past medical history of obesity, migraines, kidney stones, hypertension, interstitial cystitis, asthma, appendectomy, necrotizing fasciitis s/p left hand amputation, HELLP syndrome who presented to the ED with RUQ abdominal pain.  She has been dealing with intermittent RUQ abdominal pain since her pregnancy but had improved after giving birth 7 months ago. Since then it has been intermittent however over the past week it has acutely worsened.   Consultants: [facility]  Procedures: EGD is planned for today  Antibiotics: Anti-infectives (From admission, onward)   None      Subjective/Interval History: Patient continues to have severe abdominal pain in the upper abdomen.  Had episodes of vomiting this morning.  She has been medicated.      Assessment/Plan:  Intractable nausea and vomiting with right upper quadrant abdominal pain CTA of chest abdomen pelvis was unremarkable.  Ultrasound showed suspected fatty liver, possible steatohepatitis. GB was noted to be unremarkable.  Patient with significant tenderness in the right upper quadrant.  LFTs are surprisingly normal.  Lipase level was normal.   Patient seen by gastroenterology.  Plan is for upper endoscopy today.  If upper endoscopy is unremarkable patient will likely need a surgical consultation as it could be her gallbladder that is responsible for her symptoms. Continue PPI in the interim.  Accelerated hypertension Likely due to the fact that she has been unable to take her oral medications due to her nausea and vomiting along with pain issues.   Amlodipine was resumed.  As well as the hydralazine at a higher dose.  Patient does take labetalol at home which will also be resumed.    History of  asthma Stable.  Hypokalemia Repleted  Lactating Patient delivered her child 7 months ago.  Continues to breast-feed.  History of depression and anxiety Patient mentions that she takes Zoloft at home.  This will be resumed.  Obesity Estimated body mass index is 34.38 kg/m as calculated from the following:   Height as of this encounter: 5\' 11"  (1.803 m).   Weight as of this encounter: 111.8 kg.   DVT Prophylaxis: Lovenox Code Status: Full code Family Communication: Discussed with the patient Disposition Plan: Hopefully return home when improved  Status is: Inpatient  Remains inpatient appropriate because:Ongoing active pain requiring inpatient pain management and IV treatments appropriate due to intensity of illness or inability to take PO   Dispo: The patient is from: Home              Anticipated d/c is to: Home              Patient currently is not medically stable to d/c.   Difficult to place patient No     Medications:  Scheduled: . amLODipine  10 mg Oral Daily  . enoxaparin (LOVENOX) injection  55 mg Subcutaneous Q24H  . hydrALAZINE  50 mg Oral TID  . pantoprazole (PROTONIX) IV  40 mg Intravenous Q12H  . sodium chloride flush  3 mL Intravenous Q12H   Continuous: . lactated ringers 75 mL/hr at 05/10/20 0006  . promethazine (PHENERGAN) injection 12.5 mg (05/10/20 0751)   IRJ:JOACZYSAYTKZS, hydrALAZINE, HYDROmorphone (DILAUDID) injection, ipratropium-albuterol, ondansetron (ZOFRAN) IV, promethazine (PHENERGAN) injection   Objective:  Vital Signs  Vitals:   05/09/20 2039 05/10/20 0504 05/10/20 0717 05/10/20 1026  BP: (!) 181/115 (!) 186/115 (!) 182/101 (!) 179/105  Pulse:  80 72  79  Resp: 16 16  14   Temp: 97.9 F (36.6 C) 97.8 F (36.6 C)  98.4 F (36.9 C)  TempSrc: Oral Oral  Oral  SpO2: 99% 98%  98%  Weight:      Height:        Intake/Output Summary (Last 24 hours) at 05/10/2020 1043 Last data filed at 05/09/2020 1500 Gross per 24 hour  Intake  222.27 ml  Output 100 ml  Net 122.27 ml   Filed Weights   05/08/20 1001 05/08/20 1813  Weight: 117.9 kg 111.8 kg    General appearance: Awake alert.  In no distress Resp: Clear to auscultation bilaterally.  Normal effort Cardio: S1-S2 is normal regular.  No S3-S4.  No rubs murmurs or bruit GI: Abdomen is soft.  Tender in the epigastric area as well as the right upper quadrant without any rebound rigidity or guarding.  No masses organomegaly.  Bowel sounds present.  Extremities: No edema.  Full range of motion of lower extremities. Neurologic: Alert and oriented x3.  No focal neurological deficits.     Lab Results:  Data Reviewed: I have personally reviewed following labs and imaging studies  CBC: Recent Labs  Lab 05/08/20 1024 05/09/20 0505 05/10/20 0558  WBC 7.1 9.6 7.3  NEUTROABS 4.3  --   --   HGB 14.4 14.8 15.8*  HCT 41.8 44.1 45.0  MCV 92.3 94.4 90.7  PLT 326 336 220    Basic Metabolic Panel: Recent Labs  Lab 05/08/20 1024 05/08/20 1406 05/09/20 0505 05/10/20 0558  NA 142  --  137 138  K 3.3*  --  3.6 4.0  CL 108  --  104 104  CO2 26  --  21* 23  GLUCOSE 96  --  129* 95  BUN 12  --  12 11  CREATININE 0.79  --  0.73 0.90  CALCIUM 8.6*  --  9.2 9.3  MG  --  2.0  --   --     GFR: Estimated Creatinine Clearance: 124.7 mL/min (by C-G formula based on SCr of 0.9 mg/dL).  Liver Function Tests: Recent Labs  Lab 05/08/20 1024 05/09/20 0505 05/10/20 0558  AST 22 23 33  ALT 29 28 25   ALKPHOS 55 58 56  BILITOT 0.7 1.1 1.5*  PROT 6.7 7.4 7.1  ALBUMIN 4.2 4.3 4.3    Recent Labs  Lab 05/08/20 1024  LIPASE 26     Recent Results (from the past 240 hour(s))  Resp Panel by RT-PCR (Flu A&B, Covid) Nasopharyngeal Swab     Status: None   Collection Time: 05/08/20  4:19 PM   Specimen: Nasopharyngeal Swab; Nasopharyngeal(NP) swabs in vial transport medium  Result Value Ref Range Status   SARS Coronavirus 2 by RT PCR NEGATIVE NEGATIVE Final    Comment:  (NOTE) SARS-CoV-2 target nucleic acids are NOT DETECTED.  The SARS-CoV-2 RNA is generally detectable in upper respiratory specimens during the acute phase of infection. The lowest concentration of SARS-CoV-2 viral copies this assay can detect is 138 copies/mL. A negative result does not preclude SARS-Cov-2 infection and should not be used as the sole basis for treatment or other patient management decisions. A negative result may occur with  improper specimen collection/handling, submission of specimen other than nasopharyngeal swab, presence of viral mutation(s) within the areas targeted by this assay, and inadequate number of viral copies(<138 copies/mL). A negative result must be combined with clinical observations, patient history, and epidemiological information. The expected result  is Negative.  Fact Sheet for Patients:  EntrepreneurPulse.com.au  Fact Sheet for Healthcare Providers:  IncredibleEmployment.be  This test is no t yet approved or cleared by the [place] FDA and  has been authorized for detection and/or diagnosis of SARS-CoV-2 by FDA under an Emergency Use Authorization (EUA). This EUA will remain  in effect (meaning this test can be used) for the duration of the COVID-19 declaration under Section 564(b)(1) of the Act, 21 U.S.C.section 360bbb-3(b)(1), unless the authorization is terminated  or revoked sooner.       Influenza A by PCR NEGATIVE NEGATIVE Final   Influenza B by PCR NEGATIVE NEGATIVE Final    Comment: (NOTE) The Xpert Xpress SARS-CoV-2/FLU/RSV plus assay is intended as an aid in the diagnosis of influenza from Nasopharyngeal swab specimens and should not be used as a sole basis for treatment. Nasal washings and aspirates are unacceptable for Xpert Xpress SARS-CoV-2/FLU/RSV testing.  Fact Sheet for Patients: EntrepreneurPulse.com.au  Fact Sheet for Healthcare  Providers: IncredibleEmployment.be  This test is not yet approved or cleared by the [place] FDA and has been authorized for detection and/or diagnosis of SARS-CoV-2 by FDA under an Emergency Use Authorization (EUA). This EUA will remain in effect (meaning this test can be used) for the duration of the COVID-19 declaration under Section 564(b)(1) of the Act, 21 U.S.C. section 360bbb-3(b)(1), unless the authorization is terminated or revoked.  Performed at [facility], [place] [address] [place], [place] [address]       Radiology Studies: CT Angio Chest/Abd/Pel for Dissection W and/or Wo Contrast  Result Date: 05/08/2020 CLINICAL DATA:  Abdominal pain, aortic dissection suspected. Right upper quadrant pain on going for weeks. EXAM: CT ANGIOGRAPHY CHEST, ABDOMEN AND PELVIS TECHNIQUE: Non-contrast CT of the chest was initially obtained. Multidetector CT imaging through the chest, abdomen and pelvis was performed using the standard protocol during bolus administration of intravenous contrast. Multiplanar reconstructed images and MIPs were obtained and reviewed to evaluate the vascular anatomy. CONTRAST:  159mL OMNIPAQUE IOHEXOL 350 MG/ML SOLN COMPARISON:  Right upper quadrant ultrasound May 08, 2020 and CT abdomen pelvis October 12, 2015. FINDINGS: CTA CHEST FINDINGS Cardiovascular: No evidence of intramural hematoma on precontrast imaging. Preferential opacification of the thoracic aorta. No evidence of thoracic aortic aneurysm or dissection. Normal heart size. No pericardial effusion. Mediastinum/Nodes: No enlarged mediastinal, hilar, or axillary lymph nodes. Thyroid gland, trachea, and esophagus demonstrate no significant findings. Lungs/Pleura: Lungs are clear. No pleural effusion or pneumothorax. Musculoskeletal: No chest wall abnormality. No acute or significant osseous findings. Review of the MIP images confirms the above findings. CTA ABDOMEN AND  PELVIS FINDINGS VASCULAR Aorta: Normal caliber aorta without aneurysm, dissection, vasculitis or significant stenosis. Celiac: Patent without evidence of aneurysm, dissection, vasculitis or significant stenosis. SMA: Patent without evidence of aneurysm, dissection, vasculitis or significant stenosis. Renals: Both renal arteries are patent without evidence of aneurysm, dissection, vasculitis, fibromuscular dysplasia or significant stenosis. IMA: Patent without evidence of aneurysm, dissection, vasculitis or significant stenosis. Inflow: Patent without evidence of aneurysm, dissection, vasculitis or significant stenosis. Veins: No obvious venous abnormality within the limitations of this arterial phase study. Review of the MIP images confirms the above findings. NON-VASCULAR Hepatobiliary: No focal liver abnormality is seen. No gallstones, gallbladder wall thickening, or biliary dilatation. Pancreas: Unremarkable. No pancreatic ductal dilatation or surrounding inflammatory changes. Spleen: Lobular splenic contour with benign-appearing capsular calcifications laterally, similar to prior and likely sequela prior trauma or infection. Adrenals/Urinary Tract: Adrenal glands are unremarkable. Kidneys are  normal, without renal calculi, focal lesion, or hydronephrosis. Bladder is unremarkable. Stomach/Bowel: Stomach is within normal limits. No evidence of bowel wall thickening, distention, or inflammatory changes. Lymphatic: No pathologically enlarged lymph nodes. Reproductive: Uterus and bilateral adnexa are unremarkable. Other: No abdominopelvic ascites. Musculoskeletal: Mild lower lumbar spondylosis. Unchanged size the well-circumscribed 2.9 cm lytic lesion in the left proximal femur which demonstrates stippled internal minimal as matrix, most consistent with a benign enchondroma. Review of the MIP images confirms the above findings. IMPRESSION: 1. No evidence of acute aortic syndrome. 2. No acute findings in the chest,  abdomen or pelvis. 3. Gallbladder appears unremarkable. Electronically Signed   By: Dahlia Bailiff MD   On: 05/08/2020 12:55   US Abdomen Limited RUQ (LIVER/GB)  Result Date: 05/08/2020 CLINICAL DATA:  Right upper quadrant pain with nausea and vomiting. EXAM: ULTRASOUND ABDOMEN LIMITED RIGHT UPPER QUADRANT COMPARISON:  Right upper quadrant ultrasound 12/06/2017 FINDINGS: Gallbladder: No gallstones or wall thickening visualized. No sonographic Murphy sign noted by sonographer. Common bile duct: Diameter: 3 mm Liver: No focal lesion identified. Diffusely increased parenchymal echogenicity. Portal vein is patent on color Doppler imaging with normal direction of blood flow towards the liver. Other: None. IMPRESSION: The echogenicity of the liver is increased. This is a nonspecific finding but is most commonly seen with fatty infiltration of the liver, which in the setting of steatohepatitis can be a cause of abdominal pain. There are no obvious focal liver lesions. Gallbladder is unremarkable. Electronically Signed   By: Dahlia Bailiff MD   On: 05/08/2020 11:00       LOS: 2 days   [facility] Pager on [email or web address]  05/10/2020, 10:43 AM

## 2020-05-10 NOTE — Anesthesia Preprocedure Evaluation (Signed)
Anesthesia Evaluation  Patient identified by MRN, date of birth, ID band Patient awake    Reviewed: Allergy & Precautions, NPO status , Patient's Chart, lab work & pertinent test results  History of Anesthesia Complications Negative for: history of anesthetic complications  Airway Mallampati: I  TM Distance: >3 FB Neck ROM: Full    Dental  (+) Dental Advisory Given, Teeth Intact   Pulmonary neg shortness of breath, asthma , neg sleep apnea, neg COPD, neg recent URI, former smoker,  Covid-19 Nucleic Acid Test Results Lab Results      Component                Value               Date                      SARSCOV2NAA              NEGATIVE            05/08/2020              breath sounds clear to auscultation       Cardiovascular hypertension, Pt. on medications and Pt. on home beta blockers (-) angina(-) Past MI and (-) CHF  Rhythm:Regular     Neuro/Psych  Headaches, PSYCHIATRIC DISORDERS Anxiety    GI/Hepatic Upper abdominal pain   Endo/Other  negative endocrine ROS  Renal/GU Renal disease     Musculoskeletal negative musculoskeletal ROS (+)   Abdominal   Peds  Hematology negative hematology ROS (+)   Anesthesia Other Findings   Reproductive/Obstetrics Lab Results      Component                Value               Date                      PREGTESTUR               NEGATIVE            05/08/2020                HCG                      <5.0                12/12/2019                                        Anesthesia Physical Anesthesia Plan  ASA: II  Anesthesia Plan: MAC   Post-op Pain Management:    Induction: Intravenous  PONV Risk Score and Plan: 2 and Propofol infusion and Treatment may vary due to age or medical condition  Airway Management Planned: Nasal Cannula  Additional Equipment: None  Intra-op Plan:   Post-operative Plan:   Informed Consent: I have reviewed the  patients History and Physical, chart, labs and discussed the procedure including the risks, benefits and alternatives for the proposed anesthesia with the patient or authorized representative who has indicated his/her understanding and acceptance.     Dental advisory given  Plan Discussed with: CRNA and Surgeon  Anesthesia Plan Comments:         Anesthesia Quick Evaluation

## 2020-05-10 NOTE — Op Note (Signed)
[facility] Patient Name: Krystal [name] Procedure Date: 05/10/2020 MRN: [id number] Attending MD: Arta Silence , MD Date of Birth: 06-Mar-1990 CSN: [id number] Age: 30 Admit Type: Inpatient Procedure:                Upper GI endoscopy Indications:              Epigastric abdominal pain, Abdominal pain in the                            right upper quadrant, Nausea Providers:                Arta Silence, MD, Nelia Shi, RN, Elspeth Cho Tech., Technician, Enrigue Catena, CRNA Referring MD:             [facility] Medicines:                Monitored Anesthesia Care Complications:            No immediate complications. Estimated Blood Loss:     Estimated blood loss: none. Procedure:                Pre-Anesthesia Assessment:                           - Prior to the procedure, a History and Physical                            was performed, and patient medications and                            allergies were reviewed. The patient's tolerance of                            previous anesthesia was also reviewed. The risks                            and benefits of the procedure and the sedation                            options and risks were discussed with the patient.                            All questions were answered, and informed consent                            was obtained. Prior Anticoagulants: The patient has                            taken no previous anticoagulant or antiplatelet                            agents. ASA Grade Assessment: II - A patient with  mild systemic disease. After reviewing the risks                            and benefits, the patient was deemed in                            satisfactory condition to undergo the procedure.                           After obtaining informed consent, the endoscope was                            passed under direct vision. Throughout  the                            procedure, the patient's blood pressure, pulse, and                            oxygen saturations were monitored continuously. The                            [id number]-H190 ([id number]) Olympus gastroscope was                            introduced through the mouth, and advanced to the                            second part of duodenum. The upper GI endoscopy was                            accomplished without difficulty. The patient                            tolerated the procedure well. Scope In: Scope Out: Findings:      The examined esophagus was normal.      Patchy minimal inflammation was found in the gastric body and in the       gastric antrum. Biopsies were taken with a cold forceps for histology.      The exam of the stomach was otherwise normal.      The duodenal bulb, first portion of the duodenum and second portion of       the duodenum were normal. Impression:               - Normal esophagus.                           - Gastritis. Biopsied.                           - Normal duodenal bulb, first portion of the                            duodenum and second portion of the duodenum.                           -  No explanation for patient's abdominal pain seen                            on today's endoscopy. Moderate Sedation:      None Recommendation:           - Return patient to hospital ward for ongoing care.                           - Clear liquid diet today.                           - Continue present medications.                           - Add sucralfate.                           - Await pathology results.                           - Inpatient surgical consultation for consideration                            of cholecystectomy. Defer to surgical team whether                            or not to get HIDA, but clinical picture highly                            compatible with gallbladder process, regardless of                             HIDA scan findings.                           [facility] will follow. Procedure Code(s):        --- Professional ---                           [phone], Esophagogastroduodenoscopy, flexible,                            transoral; with biopsy, single or multiple Diagnosis Code(s):        --- Professional ---                           K29.70, Gastritis, unspecified, without bleeding                           R10.13, Epigastric pain                           R10.11, Right upper quadrant pain                           R11.0, Nausea CPT copyright 2019 [employer] Medical Association. All rights reserved. The codes documented in  this report are preliminary and upon coder review may  be revised to meet current compliance requirements. Arta Silence, MD 05/10/2020 2:13:44 PM This report has been signed electronically. Number of Addenda: 0

## 2020-05-10 NOTE — Interval H&P Note (Signed)
History and Physical Interval Note:  05/10/2020 1:41 PM  Krystal [name]  has presented today for surgery, with the diagnosis of Upper abdominal pain.  The various methods of treatment have been discussed with the patient and family. After consideration of risks, benefits and other options for treatment, the patient has consented to  Procedure(s): ESOPHAGOGASTRODUODENOSCOPY (EGD) WITH PROPOFOL (N/A) as a surgical intervention.  The patient's history has been reviewed, patient examined, no change in status, stable for surgery.  I have reviewed the patient's chart and labs.  Questions were answered to the patient's satisfaction.     Landry Dyke

## 2020-05-10 NOTE — H&P (View-Only) (Signed)
Krystal [name] [birth year]  [id number].    Requesting MD: Dr. 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  About 2 years ago, starting during her first pregnancy, she began having epigastric abdominal pain with N/V.  These episodes started more infrequently, but have become much more frequently over the last 2 years.  She states that she would have episodes of pain lasting for several minutes to several hours at a time.  She would have bilious type emesis.  She tried protonix to no avail.  She states her pain was worse after eating greasy/fatty/spicy foods as well as alcohol which she really doesn't even drink anymore.  Her pain got a little better after delivery of her first child, but returned during her second pregnancy and has persisted after.  She is now 7 months post partum.  She complains of gas and belching during these episodes.  They do wake her up from sleep with N/V as well.  Over the last week, her pain has become more consistent and worse.  She presented to the [facility] where she had a negative [place] and a CT scan that was otherwise unremarkable as well.  Her labs were all normal as well.  She was seen by GI and underwent a EGD today which revealed mild gastritis but no other significant findings to really explain her pain.  We have been asked to see her today for evaluation for her gallbladder.  ROS: ROS: Please see HPI, otherwise she states she has intermittent LE edema, she is breast feeding.  All other systems are currently  negative.  Family History  Problem Relation Age of Onset  . Hypertension Mother   . Hypertension Father     Past Medical History:  Diagnosis Date  . Anxiety   . Asthma   . Hypertension   . IC (interstitial cystitis)   . Kidney stones   . Migraines   . UTI (urinary tract infection)     Past Surgical History:  Procedure Laterality Date  . APPENDECTOMY    . CESAREAN SECTION N/A 12/06/2017   Procedure: CESAREAN SECTION;  Surgeon: Sloan Leiter, MD;  Location: [place];  Service: Obstetrics;  Laterality: N/A;  . 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 HAND RECONSTRUCTION Right   . INCISION AND DRAINAGE    . NASAL SEPTUM SURGERY      Social History:  reports that she has never smoked. She has never used smokeless tobacco. She reports current alcohol use. She reports that she does not use drugs.  Allergies:  Allergies  Allergen Reactions  . Red Dye Other (See Comments)    ALL CAUSE PROBLEMS #40 IS WORST-MIGRAINES   . Compazine [Prochlorperazine Maleate] Other (See Comments)    TWITCHING, CANT STAY STILL. Pt states she can tolerate promethazine  . Other Other (See Comments)    FANSIDAR FOR MALARIA-ASTHMA  . Vicodin [Hydrocodone-Acetaminophen] Itching and Nausea And Vomiting    Pt states she can tolerate acetaminophen  . Wheat Bran  Other (See Comments)    GLUTEN SENSITIVE  . Amitriptyline Hcl     Other reaction(s): angry moods  . Duloxetine Hcl     Other reaction(s): passing out, trimble, irritable  . Reglan [Metoclopramide] Other (See Comments)    restless  . Nifedipine Nausea And Vomiting    Discussed with patient and maternal fetal medicine, patient has intolerance but not frank allergy.    Medications Prior to Admission  Medication Sig Dispense Refill  . acetaminophen (TYLENOL) 500 MG tablet Take 1,000 mg by mouth every 8 (eight) hours as needed for mild pain or headache.    . albuterol (PROVENTIL HFA;VENTOLIN HFA) 108 (90 Base) MCG/ACT  inhaler Inhale 1 puff into the lungs every 6 (six) hours as needed for wheezing or shortness of breath.    . ALPRAZolam (XANAX) 1 MG tablet Take 0.5-1 mg by mouth 2 (two) times daily as needed for anxiety.    Marland Kitchen amLODipine (NORVASC) 10 MG tablet TAKE ONE TABLET BY MOUTH DAILY (Patient taking differently: Take 10 mg by mouth daily.) 30 tablet 2  . cetirizine (ZYRTEC) 10 MG tablet Take 10 mg by mouth daily.    . hydrALAZINE (APRESOLINE) 25 MG tablet Take 25 mg by mouth 3 (three) times daily.    Marland Kitchen ketorolac (TORADOL) 10 MG tablet Take 1 tablet (10 mg total) by mouth every 6 (six) hours as needed. (Patient taking differently: Take 10 mg by mouth every 6 (six) hours as needed for moderate pain.) 20 tablet 0  . labetalol (NORMODYNE) 200 MG tablet Take 2 tablets (400 mg total) by mouth 3 (three) times daily. (Patient taking differently: Take 400 mg by mouth 2 (two) times daily.) 90 tablet 3  . levothyroxine (SYNTHROID) 112 MCG tablet Take 112 mcg by mouth daily before breakfast.    . pantoprazole (PROTONIX) 40 MG tablet Take 40 mg by mouth daily.    . Prenatal Vit-Fe Fumarate-FA (PRENATAL PO) Take 1 tablet by mouth daily.    . sertraline (ZOLOFT) 100 MG tablet Take 150 mg by mouth daily.    . traMADol (ULTRAM) 50 MG tablet Take 50 mg by mouth 2 (two) times daily as needed for severe pain.    . cyclobenzaprine (FLEXERIL) 10 MG tablet Take 1 tablet (10 mg total) by mouth every 8 (eight) hours as needed for muscle spasms. (Patient not taking: Reported on 05/08/2020) 30 tablet 1  . diphenhydrAMINE (BENADRYL) 25 mg capsule Take 1 capsule (25 mg total) by mouth every 6 (six) hours as needed. (Patient not taking: Reported on 05/08/2020) 30 capsule 0  . hydrochlorothiazide (HYDRODIURIL) 25 MG tablet TAKE ONE TABLET BY MOUTH DAILY (Patient not taking: No sig reported) 30 tablet 2  . ibuprofen (ADVIL,MOTRIN) 600 MG tablet TAKE ONE TABLET BY MOUTH EVERY 6 HOURS AS NEEDED FOR HEADACHE, MILD PAIN OR CRAMPING (Patient not  taking: Reported on 05/08/2020) 30 tablet 0  . oxyCODONE-acetaminophen (PERCOCET/ROXICET) 5-325 MG tablet Take 1 tablet by mouth every 6 (six) hours as needed. (Patient not taking: No sig reported) 15 tablet 0  . polyethylene glycol powder (GLYCOLAX/MIRALAX) powder Take 17 g by mouth daily as needed for moderate constipation. (Patient not taking: No sig reported) 500 g 2  . promethazine (PHENERGAN) 25 MG tablet Take 1 tablet (25 mg total) by mouth every 6 (six) hours as needed for nausea or vomiting. (Patient not taking: No sig reported) 30 tablet 0  . sertraline (ZOLOFT) 50 MG tablet Take 1 tablet (50 mg total) by mouth daily. (Patient  not taking: No sig reported) 30 tablet 2     Physical Exam: Blood pressure (!) 171/95, pulse 78, temperature 97.8 F (36.6 C), temperature source Oral, resp. rate 14, height 5\' 11"  (1.803 m), weight 111.8 kg, SpO2 96 %, unknown if currently breastfeeding. General: pleasant, WD, WN, mildly obese white female who is laying in bed in NAD HEENT: head is normocephalic, atraumatic.  Sclera are noninjected.  PERRL.  Ears and nose without any masses or lesions.  Mouth is pink and moist Heart: regular, rate, and rhythm.  Normal s1,s2. No obvious murmurs, gallops, or rubs noted.  Palpable pedal pulses bilaterally and radial pulse on right side Lungs: CTAB, no wheezes, rhonchi, or rales noted.  Respiratory effort nonlabored Abd: soft, she is quite tender in her epigastrium, minimal in RUQ, no peritonitis, ND, +BS, no masses, hernias, or organomegaly,. Scars from prior surgeries noted. MS: all 4 extremities are symmetrical with no cyanosis, clubbing, or edema, except LUE as she has a hand amputation and RUE with scars from reconstructive surgery. Skin: warm and dry with no masses, lesions, or rashes Neuro: Cranial nerves 2-12 grossly intact, sensation is normal throughout Psych: A&Ox3 with an appropriate affect.   Results for orders placed or performed during the hospital  encounter of 05/08/20 (from the past 48 hour(s))  Resp Panel by RT-PCR (Flu A&B, Covid) Nasopharyngeal Swab     Status: None   Collection Time: 05/08/20  4:19 PM   Specimen: Nasopharyngeal Swab; Nasopharyngeal(NP) swabs in vial transport medium  Result Value Ref Range   SARS Coronavirus 2 by RT PCR NEGATIVE NEGATIVE    Comment: (NOTE) SARS-CoV-2 target nucleic acids are NOT DETECTED.  The SARS-CoV-2 RNA is generally detectable in upper respiratory specimens during the acute phase of infection. The lowest concentration of SARS-CoV-2 viral copies this assay can detect is 138 copies/mL. A negative result does not preclude SARS-Cov-2 infection and should not be used as the sole basis for treatment or other patient management decisions. A negative result may occur with  improper specimen collection/handling, submission of specimen other than nasopharyngeal swab, presence of viral mutation(s) within the areas targeted by this assay, and inadequate number of viral copies(<138 copies/mL). A negative result must be combined with clinical observations, patient history, and epidemiological information. The expected result is Negative.  Fact Sheet for Patients:  EntrepreneurPulse.com.au  Fact Sheet for Healthcare Providers:  IncredibleEmployment.be  This test is no t yet approved or cleared by the [place] FDA and  has been authorized for detection and/or diagnosis of SARS-CoV-2 by FDA under an Emergency Use Authorization (EUA). This EUA will remain  in effect (meaning this test can be used) for the duration of the COVID-19 declaration under Section 564(b)(1) of the Act, 21 U.S.C.section 360bbb-3(b)(1), unless the authorization is terminated  or revoked sooner.       Influenza A by PCR NEGATIVE NEGATIVE   Influenza B by PCR NEGATIVE NEGATIVE    Comment: (NOTE) The Xpert Xpress SARS-CoV-2/FLU/RSV plus assay is intended as an aid in the diagnosis of  influenza from Nasopharyngeal swab specimens and should not be used as a sole basis for treatment. Nasal washings and aspirates are unacceptable for Xpert Xpress SARS-CoV-2/FLU/RSV testing.  Fact Sheet for Patients: EntrepreneurPulse.com.au  Fact Sheet for Healthcare Providers: IncredibleEmployment.be  This test is not yet approved or cleared by the [place] FDA and has been authorized for detection and/or diagnosis of SARS-CoV-2 by FDA under an Emergency Use Authorization (EUA). This EUA will remain in  effect (meaning this test can be used) for the duration of the COVID-19 declaration under Section 564(b)(1) of the Act, 21 U.S.C. section 360bbb-3(b)(1), unless the authorization is terminated or revoked.  Performed at [facility], [place] [address], [place], [place] [address]   HIV Antibody (routine testing w rflx)     Status: None   Collection Time: 05/08/20  7:05 PM  Result Value Ref Range   HIV Screen 4th Generation wRfx Non Reactive Non Reactive    Comment: Performed at [facility], [place] [address] [place], [place] [address]  CBC     Status: None   Collection Time: 05/09/20  5:05 AM  Result Value Ref Range   WBC 9.6 4.0 - 10.5 K/uL   RBC 4.67 3.87 - 5.11 MIL/uL   Hemoglobin 14.8 12.0 - 15.0 g/dL   HCT 44.1 36.0 - 46.0 %   MCV 94.4 80.0 - 100.0 fL   MCH 31.7 26.0 - 34.0 pg   MCHC 33.6 30.0 - 36.0 g/dL   RDW 13.0 11.5 - 15.5 %   Platelets 336 150 - 400 K/uL   nRBC 0.0 0.0 - 0.2 %    Comment: Performed at [name], [place] [address] [place], [place] [address]  Comprehensive metabolic panel     Status: Abnormal   Collection Time: 05/09/20  5:05 AM  Result Value Ref Range   Sodium 137 135 - 145 mmol/L   Potassium 3.6 3.5 - 5.1 mmol/L   Chloride 104 98 - 111 mmol/L   CO2 21 (L) 22 - 32 mmol/L   Glucose, Bld 129 (H) 70 - 99 mg/dL    Comment: Glucose reference range applies only to  samples taken after fasting for at least 8 hours.   BUN 12 6 - 20 mg/dL   Creatinine, Ser 0.73 0.44 - 1.00 mg/dL   Calcium 9.2 8.9 - 10.3 mg/dL   Total Protein 7.4 6.5 - 8.1 g/dL   Albumin 4.3 3.5 - 5.0 g/dL   AST 23 15 - 41 U/L   ALT 28 0 - 44 U/L   Alkaline Phosphatase 58 38 - 126 U/L   Total Bilirubin 1.1 0.3 - 1.2 mg/dL   GFR, Estimated >60 >60 mL/min    Comment: (NOTE) Calculated using the CKD-EPI Creatinine Equation (2021)    Anion gap 12 5 - 15    Comment: Performed at [facility], [place] [address] [place], [place] [address]  CBC     Status: Abnormal   Collection Time: 05/10/20  5:58 AM  Result Value Ref Range   WBC 7.3 4.0 - 10.5 K/uL   RBC 4.96 3.87 - 5.11 MIL/uL   Hemoglobin 15.8 (H) 12.0 - 15.0 g/dL   HCT 45.0 36.0 - 46.0 %   MCV 90.7 80.0 - 100.0 fL   MCH 31.9 26.0 - 34.0 pg   MCHC 35.1 30.0 - 36.0 g/dL   RDW 12.6 11.5 - 15.5 %   Platelets 172 150 - 400 K/uL   nRBC 0.0 0.0 - 0.2 %    Comment: Performed at [facility], [place] [address] [place], [place] [address]  Comprehensive metabolic panel     Status: Abnormal   Collection Time: 05/10/20  5:58 AM  Result Value Ref Range   Sodium 138 135 - 145 mmol/L   Potassium 4.0 3.5 - 5.1 mmol/L   Chloride 104 98 - 111 mmol/L   CO2 23 22 - 32 mmol/L  Glucose, Bld 95 70 - 99 mg/dL    Comment: Glucose reference range applies only to samples taken after fasting for at least 8 hours.   BUN 11 6 - 20 mg/dL   Creatinine, Ser 0.90 0.44 - 1.00 mg/dL   Calcium 9.3 8.9 - 10.3 mg/dL   Total Protein 7.1 6.5 - 8.1 g/dL   Albumin 4.3 3.5 - 5.0 g/dL   AST 33 15 - 41 U/L   ALT 25 0 - 44 U/L   Alkaline Phosphatase 56 38 - 126 U/L   Total Bilirubin 1.5 (H) 0.3 - 1.2 mg/dL   GFR, Estimated >60 >60 mL/min    Comment: (NOTE) Calculated using the CKD-EPI Creatinine Equation (2021)    Anion gap 11 5 - 15    Comment: Performed at [facility], [place] [address] [place], [place]  [address]   No results found.    Assessment/Plan Asthma HTN Migraines Anxiety Breast feeding  Abdominal pain The patient has a negative US/CT scan in regards to her gallbladder.  No other acute intra-abdominal abnormalities were found either.  She underwent an EGD today with no acute findings.  Her symptoms have been present for at least 2 years and started with her pregnancies.  Many of her symptoms are c/w biliary disease despite no evidence of cholecystitis or gallstones.  Dyskinesia typically doesn't cause pain like this, although I have seen it before.  We will order a HIDA scan with an EF to further evaluate this.  We discussed the possibility of lap chole pending these results and that this can sometimes fix the pain but sometimes does not fix the pain.  She understands this.  She will be NPO p MN and no narcotics after MN for HIDA tomorrow.  We will continue to follow.  FEN - CLD/NPO p MN VTE - Lovenox ID - none currently  Henreitta Cea, [facility] 05/10/2020, 3:56 PM Please see Amion for pager number during day hours 7:00am-4:30pm or 7:00am -11:30am on weekends

## 2020-05-11 ENCOUNTER — Encounter (HOSPITAL_COMMUNITY)
Admission: EM | Disposition: A | Discharge status: Home / Self Care | Payer: Self-pay | Source: Home / Self Care | Attending: Internal Medicine

## 2020-05-11 ENCOUNTER — Inpatient Hospital Stay (HOSPITAL_COMMUNITY): Payer: 59 | Admitting: Certified Registered Nurse Anesthetist

## 2020-05-11 ENCOUNTER — Inpatient Hospital Stay (HOSPITAL_COMMUNITY): Payer: 59

## 2020-05-11 ENCOUNTER — Encounter (HOSPITAL_COMMUNITY): Payer: Self-pay | Admitting: Gastroenterology

## 2020-05-11 DIAGNOSIS — I10 Essential (primary) hypertension: Secondary | ICD-10-CM | POA: Insufficient documentation

## 2020-05-11 DIAGNOSIS — F5101 Primary insomnia: Secondary | ICD-10-CM | POA: Diagnosis present

## 2020-05-11 DIAGNOSIS — I1A0 Resistant hypertension: Secondary | ICD-10-CM | POA: Insufficient documentation

## 2020-05-11 DIAGNOSIS — K810 Acute cholecystitis: Secondary | ICD-10-CM

## 2020-05-11 DIAGNOSIS — N912 Amenorrhea, unspecified: Secondary | ICD-10-CM | POA: Insufficient documentation

## 2020-05-11 DIAGNOSIS — M797 Fibromyalgia: Secondary | ICD-10-CM | POA: Diagnosis present

## 2020-05-11 DIAGNOSIS — K219 Gastro-esophageal reflux disease without esophagitis: Secondary | ICD-10-CM | POA: Insufficient documentation

## 2020-05-11 DIAGNOSIS — N3010 Interstitial cystitis (chronic) without hematuria: Secondary | ICD-10-CM | POA: Insufficient documentation

## 2020-05-11 DIAGNOSIS — F4310 Post-traumatic stress disorder, unspecified: Secondary | ICD-10-CM | POA: Insufficient documentation

## 2020-05-11 DIAGNOSIS — F330 Major depressive disorder, recurrent, mild: Secondary | ICD-10-CM | POA: Insufficient documentation

## 2020-05-11 HISTORY — PX: CHOLECYSTECTOMY: SHX55

## 2020-05-11 HISTORY — DX: Acute cholecystitis: K81.0

## 2020-05-11 HISTORY — DX: Resistant hypertension: I1A.0

## 2020-05-11 LAB — COMPREHENSIVE METABOLIC PANEL
ALT: 26 U/L (ref 0–44)
AST: 21 U/L (ref 15–41)
Albumin: 4 g/dL (ref 3.5–5.0)
Alkaline Phosphatase: 50 U/L (ref 38–126)
Anion gap: 11 (ref 5–15)
BUN: 11 mg/dL (ref 6–20)
CO2: 23 mmol/L (ref 22–32)
Calcium: 9.3 mg/dL (ref 8.9–10.3)
Chloride: 103 mmol/L (ref 98–111)
Creatinine, Ser: 0.81 mg/dL (ref 0.44–1.00)
GFR, Estimated: >60 mL/min (ref >60)
Glucose, Bld: 97 mg/dL (ref 70–99)
Potassium: 3 mmol/L — ABNORMAL LOW (ref 3.5–5.1)
Sodium: 137 mmol/L (ref 135–145)
Total Bilirubin: 1.2 mg/dL (ref 0.3–1.2)
Total Protein: 6.7 g/dL (ref 6.5–8.1)

## 2020-05-11 LAB — SURGICAL PATHOLOGY

## 2020-05-11 SURGERY — LAPAROSCOPIC CHOLECYSTECTOMY WITH INTRAOPERATIVE CHOLANGIOGRAM
Anesthesia: General

## 2020-05-11 MED ORDER — BUPIVACAINE LIPOSOME 1.3 % IJ SUSP
20.0000 mL | Freq: Once | INTRAMUSCULAR | Status: AC
  Administered 2020-05-11: 20 mL
  Filled 2020-05-11: qty 20

## 2020-05-11 MED ORDER — PROPOFOL 10 MG/ML IV BOLUS
INTRAVENOUS | Status: AC
  Filled 2020-05-11: qty 20

## 2020-05-11 MED ORDER — MAGIC MOUTHWASH
15.0000 mL | Freq: Four times a day (QID) | ORAL | Status: DC | PRN
  Filled 2020-05-11: qty 15

## 2020-05-11 MED ORDER — EPHEDRINE 5 MG/ML INJ
INTRAVENOUS | Status: AC
  Filled 2020-05-11: qty 10

## 2020-05-11 MED ORDER — ALUM & MAG HYDROXIDE-SIMETH 200-200-20 MG/5ML PO SUSP: 30.0000 mL | Freq: Four times a day (QID) | ORAL | Status: DC | PRN

## 2020-05-11 MED ORDER — ONDANSETRON HCL 4 MG/2ML IJ SOLN
INTRAMUSCULAR | Status: AC
  Filled 2020-05-11: qty 2

## 2020-05-11 MED ORDER — HYDROMORPHONE HCL 1 MG/ML IJ SOLN
INTRAMUSCULAR | Status: AC
  Filled 2020-05-11: qty 1

## 2020-05-11 MED ORDER — SIMETHICONE 40 MG/0.6ML PO SUSP
40.0000 mg | Freq: Four times a day (QID) | ORAL | Status: DC | PRN
  Filled 2020-05-11: qty 0.6

## 2020-05-11 MED ORDER — OXYCODONE HCL 5 MG PO TABS
ORAL_TABLET | ORAL | Status: AC
  Filled 2020-05-11: qty 1

## 2020-05-11 MED ORDER — LIDOCAINE 2% (20 MG/ML) 5 ML SYRINGE
INTRAMUSCULAR | Status: AC
  Filled 2020-05-11: qty 5

## 2020-05-11 MED ORDER — PHENYLEPHRINE 40 MCG/ML (10ML) SYRINGE FOR IV PUSH (FOR BLOOD PRESSURE SUPPORT)
PREFILLED_SYRINGE | INTRAVENOUS | Status: AC
  Filled 2020-05-11: qty 10

## 2020-05-11 MED ORDER — BUPIVACAINE-EPINEPHRINE (PF) 0.25% -1:200000 IJ SOLN
INTRAMUSCULAR | Status: AC
  Filled 2020-05-11: qty 30

## 2020-05-11 MED ORDER — LIP MEDEX EX OINT
1.0000 "application " | TOPICAL_OINTMENT | Freq: Two times a day (BID) | CUTANEOUS | Status: DC
  Administered 2020-05-11 – 2020-05-14 (×6): 1 via TOPICAL
  Filled 2020-05-11: qty 7

## 2020-05-11 MED ORDER — DEXAMETHASONE SODIUM PHOSPHATE 10 MG/ML IJ SOLN
INTRAMUSCULAR | Status: DC | PRN
  Administered 2020-05-11: 10 mg via INTRAVENOUS

## 2020-05-11 MED ORDER — OXYCODONE HCL 5 MG PO TABS: 5.0000 mg | ORAL_TABLET | Freq: Once | ORAL | Status: DC | PRN

## 2020-05-11 MED ORDER — SODIUM CHLORIDE 0.9 % IV SOLN
INTRAVENOUS | Status: AC
  Filled 2020-05-11: qty 20

## 2020-05-11 MED ORDER — MIDAZOLAM HCL 2 MG/2ML IJ SOLN
INTRAMUSCULAR | Status: AC
  Filled 2020-05-11: qty 2

## 2020-05-11 MED ORDER — CHLORHEXIDINE GLUCONATE 0.12 % MT SOLN: 15.0000 mL | Freq: Once | OROMUCOSAL | Status: DC

## 2020-05-11 MED ORDER — KETOROLAC TROMETHAMINE 30 MG/ML IJ SOLN
INTRAMUSCULAR | Status: AC
  Filled 2020-05-11: qty 1

## 2020-05-11 MED ORDER — ONDANSETRON HCL 4 MG/2ML IJ SOLN
INTRAMUSCULAR | Status: DC | PRN
  Administered 2020-05-11: 4 mg via INTRAVENOUS

## 2020-05-11 MED ORDER — ROCURONIUM BROMIDE 10 MG/ML (PF) SYRINGE
PREFILLED_SYRINGE | INTRAVENOUS | Status: DC | PRN
  Administered 2020-05-11: 50 mg via INTRAVENOUS

## 2020-05-11 MED ORDER — FENTANYL CITRATE (PF) 100 MCG/2ML IJ SOLN
25.0000 ug | INTRAMUSCULAR | Status: DC | PRN
  Administered 2020-05-11 (×2): 50 ug via INTRAVENOUS

## 2020-05-11 MED ORDER — HYDROMORPHONE HCL 1 MG/ML IJ SOLN
0.2500 mg | INTRAMUSCULAR | Status: DC | PRN
  Administered 2020-05-11 (×4): 0.5 mg via INTRAVENOUS

## 2020-05-11 MED ORDER — BUPIVACAINE-EPINEPHRINE 0.25% -1:200000 IJ SOLN
INTRAMUSCULAR | Status: DC | PRN
  Administered 2020-05-11: 30 mL

## 2020-05-11 MED ORDER — ACETAMINOPHEN 10 MG/ML IV SOLN
INTRAVENOUS | Status: AC
  Filled 2020-05-11: qty 100

## 2020-05-11 MED ORDER — OXYCODONE HCL 5 MG/5ML PO SOLN
5.0000 mg | Freq: Once | ORAL | Status: DC | PRN
Start: 2020-05-11 — End: 2020-05-11

## 2020-05-11 MED ORDER — PROCHLORPERAZINE EDISYLATE 10 MG/2ML IJ SOLN: 10.0000 mg | Freq: Four times a day (QID) | INTRAMUSCULAR | Status: DC | PRN

## 2020-05-11 MED ORDER — POTASSIUM CHLORIDE 10 MEQ/100ML IV SOLN
10.0000 meq | INTRAVENOUS | Status: AC
  Filled 2020-05-11: qty 100

## 2020-05-11 MED ORDER — LACTATED RINGERS IR SOLN
Status: DC | PRN
  Administered 2020-05-11: 1000 mL

## 2020-05-11 MED ORDER — CALCIUM POLYCARBOPHIL 625 MG PO TABS
625.0000 mg | ORAL_TABLET | Freq: Two times a day (BID) | ORAL | Status: DC
  Administered 2020-05-11 – 2020-05-14 (×6): 625 mg via ORAL
  Filled 2020-05-11 (×6): qty 1

## 2020-05-11 MED ORDER — POTASSIUM CHLORIDE 10 MEQ/100ML IV SOLN
10.0000 meq | INTRAVENOUS | Status: AC
  Administered 2020-05-11 (×2): 10 meq via INTRAVENOUS

## 2020-05-11 MED ORDER — FENTANYL CITRATE (PF) 100 MCG/2ML IJ SOLN
INTRAMUSCULAR | Status: DC | PRN
  Administered 2020-05-11: 50 ug via INTRAVENOUS
  Administered 2020-05-11: 100 ug via INTRAVENOUS

## 2020-05-11 MED ORDER — ENALAPRILAT 1.25 MG/ML IV SOLN
0.6250 mg | Freq: Four times a day (QID) | INTRAVENOUS | Status: DC | PRN
  Filled 2020-05-11: qty 1

## 2020-05-11 MED ORDER — OXYCODONE-ACETAMINOPHEN 5-325 MG PO TABS: 1.0000 | ORAL_TABLET | Freq: Four times a day (QID) | ORAL | 0 refills | Status: DC | PRN

## 2020-05-11 MED ORDER — SODIUM CHLORIDE 0.9 % IV SOLN
2.0000 g | INTRAVENOUS | Status: AC
  Administered 2020-05-11: 2 g via INTRAVENOUS

## 2020-05-11 MED ORDER — PROPOFOL 10 MG/ML IV BOLUS
INTRAVENOUS | Status: DC | PRN
  Administered 2020-05-11: 180 mg via INTRAVENOUS

## 2020-05-11 MED ORDER — SODIUM CHLORIDE 0.9 % IV SOLN: 250.0000 mL | INTRAVENOUS | Status: DC | PRN

## 2020-05-11 MED ORDER — LACTATED RINGERS IV SOLN: INTRAVENOUS | Status: DC

## 2020-05-11 MED ORDER — KETOROLAC TROMETHAMINE 30 MG/ML IJ SOLN
INTRAMUSCULAR | Status: DC | PRN
  Administered 2020-05-11: 30 mg via INTRAVENOUS

## 2020-05-11 MED ORDER — ACETAMINOPHEN 10 MG/ML IV SOLN
1000.0000 mg | Freq: Once | INTRAVENOUS | Status: AC
  Administered 2020-05-11: 1000 mg via INTRAVENOUS

## 2020-05-11 MED ORDER — SUGAMMADEX SODIUM 200 MG/2ML IV SOLN
INTRAVENOUS | Status: DC | PRN
  Administered 2020-05-11: 200 mg via INTRAVENOUS

## 2020-05-11 MED ORDER — ONDANSETRON HCL 4 MG/2ML IJ SOLN
4.0000 mg | Freq: Four times a day (QID) | INTRAMUSCULAR | Status: AC | PRN
  Administered 2020-05-11: 4 mg via INTRAVENOUS

## 2020-05-11 MED ORDER — LACTATED RINGERS IV BOLUS: 1000.0000 mL | Freq: Three times a day (TID) | INTRAVENOUS | Status: AC | PRN

## 2020-05-11 MED ORDER — OXYCODONE HCL 5 MG PO TABS
5.0000 mg | ORAL_TABLET | ORAL | Status: DC | PRN
  Administered 2020-05-12 – 2020-05-14 (×11): 10 mg via ORAL
  Filled 2020-05-11 (×11): qty 2

## 2020-05-11 MED ORDER — PROCHLORPERAZINE EDISYLATE 10 MG/2ML IJ SOLN: 5.0000 mg | INTRAMUSCULAR | Status: DC | PRN

## 2020-05-11 MED ORDER — BISACODYL 10 MG RE SUPP: 10.0000 mg | Freq: Two times a day (BID) | RECTAL | Status: DC | PRN

## 2020-05-11 MED ORDER — FENTANYL CITRATE (PF) 100 MCG/2ML IJ SOLN
INTRAMUSCULAR | Status: AC
  Filled 2020-05-11: qty 2

## 2020-05-11 MED ORDER — FENTANYL CITRATE (PF) 250 MCG/5ML IJ SOLN
INTRAMUSCULAR | Status: AC
  Filled 2020-05-11: qty 5

## 2020-05-11 MED ORDER — ROCURONIUM BROMIDE 10 MG/ML (PF) SYRINGE
PREFILLED_SYRINGE | INTRAVENOUS | Status: AC
  Filled 2020-05-11: qty 10

## 2020-05-11 MED ORDER — HYDROMORPHONE HCL 1 MG/ML IJ SOLN
1.0000 mg | Freq: Once | INTRAMUSCULAR | Status: AC
  Administered 2020-05-11: 1 mg via INTRAVENOUS

## 2020-05-11 MED ORDER — SODIUM CHLORIDE 0.9 % IV SOLN
2.0000 g | INTRAVENOUS | Status: DC
  Filled 2020-05-11: qty 20

## 2020-05-11 MED ORDER — SODIUM CHLORIDE 0.9% FLUSH: 3.0000 mL | INTRAVENOUS | Status: DC | PRN

## 2020-05-11 MED ORDER — SODIUM CHLORIDE 0.9% FLUSH
3.0000 mL | Freq: Two times a day (BID) | INTRAVENOUS | Status: DC
  Administered 2020-05-11 – 2020-05-14 (×4): 3 mL via INTRAVENOUS

## 2020-05-11 MED ORDER — DIPHENHYDRAMINE HCL 50 MG/ML IJ SOLN: 12.5000 mg | Freq: Four times a day (QID) | INTRAMUSCULAR | Status: DC | PRN

## 2020-05-11 MED ORDER — LIDOCAINE 2% (20 MG/ML) 5 ML SYRINGE
INTRAMUSCULAR | Status: AC
  Filled 2020-05-11: qty 10

## 2020-05-11 MED ORDER — TECHNETIUM TC 99M MEBROFENIN IV KIT
5.2000 | PACK | Freq: Once | INTRAVENOUS | Status: AC | PRN
  Administered 2020-05-11: 5.2 via INTRAVENOUS

## 2020-05-11 MED ORDER — MIDAZOLAM HCL 5 MG/5ML IJ SOLN
INTRAMUSCULAR | Status: DC | PRN
  Administered 2020-05-11: 2 mg via INTRAVENOUS

## 2020-05-11 MED ORDER — LIDOCAINE 2% (20 MG/ML) 5 ML SYRINGE
INTRAMUSCULAR | Status: DC | PRN
  Administered 2020-05-11: 40 mg via INTRAVENOUS

## 2020-05-11 SURGICAL SUPPLY — 43 items
APPLIER CLIP 5 13 M/L LIGAMAX5 (MISCELLANEOUS) ×4
APPLIER CLIP ROT 10 11.4 M/L (STAPLE)
CABLE HIGH FREQUENCY MONO STRZ (ELECTRODE) IMPLANT
CLIP APPLIE 5 13 M/L LIGAMAX5 (MISCELLANEOUS) IMPLANT
CLIP APPLIE ROT 10 11.4 M/L (STAPLE) IMPLANT
COVER MAYO STAND STRL (DRAPES) ×1 IMPLANT
COVER SURGICAL LIGHT HANDLE (MISCELLANEOUS) ×2 IMPLANT
COVER WAND RF STERILE (DRAPES) ×1 IMPLANT
DECANTER SPIKE VIAL GLASS SM (MISCELLANEOUS) ×1 IMPLANT
DRAPE C-ARM 42X120 X-RAY (DRAPES) ×1 IMPLANT
DRAPE UTILITY XL STRL (DRAPES) ×2 IMPLANT
DRAPE WARM FLUID 44X44 (DRAPES) ×2 IMPLANT
DRSG TEGADERM 2-3/8X2-3/4 SM (GAUZE/BANDAGES/DRESSINGS) ×8 IMPLANT
ELECT L-HOOK LAP 45CM DISP (ELECTROSURGICAL) ×2
ELECT REM PT RETURN 15FT ADLT (MISCELLANEOUS) ×2 IMPLANT
ELECTRODE L-HOOK LAP 45CM DISP (ELECTROSURGICAL) IMPLANT
ENDOLOOP SUT PDS II  0 18 (SUTURE)
ENDOLOOP SUT PDS II 0 18 (SUTURE) IMPLANT
GLOVE SURG LTX SZ8 (GLOVE) ×2 IMPLANT
GLOVE SURG UNDER LTX SZ8 (GLOVE) ×2 IMPLANT
GOWN STRL REUS W/TWL XL LVL3 (GOWN DISPOSABLE) ×4 IMPLANT
IRRIG SUCT STRYKERFLOW 2 WTIP (MISCELLANEOUS) ×2
IRRIGATION SUCT STRKRFLW 2 WTP (MISCELLANEOUS) ×1 IMPLANT
KIT BASIN OR (CUSTOM PROCEDURE TRAY) ×2 IMPLANT
KIT TURNOVER KIT A (KITS) ×2 IMPLANT
NDL BIOPSY 14X6 SOFT TISS (NEEDLE) IMPLANT
NEEDLE BIOPSY 14X6 SOFT TISS (NEEDLE) ×2 IMPLANT
PENCIL SMOKE EVACUATOR (MISCELLANEOUS) IMPLANT
POUCH RETRIEVAL ECOSAC 10 (ENDOMECHANICALS) ×1 IMPLANT
POUCH RETRIEVAL ECOSAC 10MM (ENDOMECHANICALS) ×1
SCISSORS LAP 5X35 DISP (ENDOMECHANICALS) ×2 IMPLANT
SET CHOLANGIOGRAPH MIX (MISCELLANEOUS) ×1 IMPLANT
SET TUBE SMOKE EVAC HIGH FLOW (TUBING) ×2 IMPLANT
SLEEVE XCEL OPT CAN 5 100 (ENDOMECHANICALS) ×1 IMPLANT
SUT MNCRL AB 4-0 PS2 18 (SUTURE) ×2 IMPLANT
SUT PDS AB 1 CT1 27 (SUTURE) ×2 IMPLANT
SYR 20ML LL LF (SYRINGE) ×2 IMPLANT
TOWEL OR 17X26 10 PK STRL BLUE (TOWEL DISPOSABLE) ×2 IMPLANT
TOWEL OR NON WOVEN STRL DISP B (DISPOSABLE) ×2 IMPLANT
TRAY LAPAROSCOPIC (CUSTOM PROCEDURE TRAY) ×2 IMPLANT
TROCAR BLADELESS OPT 5 100 (ENDOMECHANICALS) ×1 IMPLANT
TROCAR BLADELESS OPT 5 150 (ENDOMECHANICALS) ×1 IMPLANT
TROCAR XCEL NON-BLD 11X100MML (ENDOMECHANICALS) ×1 IMPLANT

## 2020-05-11 NOTE — Interval H&P Note (Signed)
History and Physical Interval Note:  05/11/2020 6:27 PM  Krystal [name]  has presented today for surgery, with the diagnosis of ACUTE CHOLCYSTITIS.  The various methods of treatment have been discussed with the patient and family. After consideration of risks, benefits and other options for treatment, the patient has consented to  Procedure(s): LAPAROSCOPIC CHOLECYSTECTOMY WITH INTRAOPERATIVE CHOLANGIOGRAM (N/A) as a surgical intervention.  The patient's history has been reviewed, patient examined, no change in status, stable for surgery.  I have reviewed the patient's chart and labs.  Questions were answered to the patient's satisfaction.    Vomiting after HIDA scan in his room with decreased gallbladder ejection fraction.  We will do cholecystectomy tonight  The anatomy & physiology of hepatobiliary & pancreatic function was discussed.  The pathophysiology of gallbladder dysfunction was discussed.  Natural history risks without surgery was discussed.   I feel the risks of no intervention will lead to serious problems that outweigh the operative risks; therefore, I recommended cholecystectomy to remove the pathology.  I explained laparoscopic techniques with possible need for an open approach.  Probable cholangiogram to evaluate the bilary tract was explained as well.    Risks such as bleeding, infection, diarrhea and other bowel changes, abscess, leak, injury to other organs, need for repair of tissues / organs, need for further treatment, stroke, heart attack, death, and other risks were discussed.  I noted a good likelihood this will help address the problem, but there is a chance it may not help.  Possibility that this will not correct all abdominal symptoms was explained.  Goals of post-operative recovery were discussed as well.  We will work to minimize complications.  An educational handout further explaining the pathology and treatment options was given as well.  Questions were answered.  The  patient expresses understanding & wishes to proceed with surgery.   I have re-reviewed the the patient's records, history, medications, and allergies.  I have re-examined the patient.  I again discussed intraoperative plans and goals of post-operative recovery.  The patient agrees to proceed.  Krystal [name]  10-28-1990 [id number]  Patient Care Team: Maurice Small, MD as PCP - General (Family Medicine)  Patient Active Problem List   Diagnosis Date Noted   Intractable right upper quadrant abdominal pain 05/08/2020   Asthma 05/08/2020   Hypokalemia 05/08/2020   Hypertensive urgency 05/08/2020   Nexplanon insertion 01/19/2018   HELLP syndrome 12/08/2017   HELLP (hemolytic anemia/elev liver enzymes/low platelets in pregnancy) 12/08/2017    Past Medical History:  Diagnosis Date   Anxiety    Asthma    Hypertension    IC (interstitial cystitis)    Kidney stones    Migraines    UTI (urinary tract infection)     Past Surgical History:  Procedure Laterality Date   APPENDECTOMY     BIOPSY  05/10/2020   Procedure: BIOPSY;  Surgeon: Arta Silence, MD;  Location: [facility];  Service: Endoscopy;;   CESAREAN SECTION N/A 12/06/2017   Procedure: CESAREAN SECTION;  Surgeon: Sloan Leiter, MD;  Location: [facility];  Service: Obstetrics;  Laterality: N/A;   ESOPHAGOGASTRODUODENOSCOPY (EGD) WITH PROPOFOL N/A 05/10/2020   Procedure: ESOPHAGOGASTRODUODENOSCOPY (EGD) WITH PROPOFOL;  Surgeon: Arta Silence, MD;  Location: [facility];  Service: Endoscopy;  Laterality: N/A;   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   HAND RECONSTRUCTION Right    INCISION AND DRAINAGE     NASAL SEPTUM SURGERY  Social History   Socioeconomic History   Marital status: Married    Spouse name: Not on file   Number of children: Not on file   Years of education: Not on file   Highest education level: Not on file  Occupational History   Not on file  Tobacco Use   Smoking  status: Never Smoker   Smokeless tobacco: Never Used   Tobacco comment: She tried a cigarette and never smoked again  Vaping Use   Vaping Use: Never used  Substance and Sexual Activity   Alcohol use: Yes    Comment: bottle of wine a week-occ   Drug use: No   Sexual activity: Not on file  Other Topics Concern   Not on file  Social History Narrative   Not on file   Social Determinants of Health   Financial Resource Strain: Not on file  Food Insecurity: Not on file  Transportation Needs: Not on file  Physical Activity: Not on file  Stress: Not on file  Social Connections: Not on file  Intimate Partner Violence: Not on file    Family History  Problem Relation Age of Onset   Hypertension Mother    Hypertension Father     Medications Prior to Admission  Medication Sig Dispense Refill Last Dose   acetaminophen (TYLENOL) 500 MG tablet Take 1,000 mg by mouth every 8 (eight) hours as needed for mild pain or headache.   05/06/2020   albuterol (PROVENTIL HFA;VENTOLIN HFA) 108 (90 Base) MCG/ACT inhaler Inhale 1 puff into the lungs every 6 (six) hours as needed for wheezing or shortness of breath.   Past Week at Unknown time   ALPRAZolam (XANAX) 1 MG tablet Take 0.5-1 mg by mouth 2 (two) times daily as needed for anxiety.   05/07/2020 at Unknown time   amLODipine (NORVASC) 10 MG tablet TAKE ONE TABLET BY MOUTH DAILY (Patient taking differently: Take 10 mg by mouth daily.) 30 tablet 2 05/07/2020 at Unknown time   cetirizine (ZYRTEC) 10 MG tablet Take 10 mg by mouth daily.   05/07/2020 at Unknown time   hydrALAZINE (APRESOLINE) 25 MG tablet Take 25 mg by mouth 3 (three) times daily.   05/07/2020 at Unknown time   ketorolac (TORADOL) 10 MG tablet Take 1 tablet (10 mg total) by mouth every 6 (six) hours as needed. (Patient taking differently: Take 10 mg by mouth every 6 (six) hours as needed for moderate pain.) 20 tablet 0 05/07/2020 at Unknown time   labetalol (NORMODYNE) 200 MG tablet Take 2  tablets (400 mg total) by mouth 3 (three) times daily. (Patient taking differently: Take 400 mg by mouth 2 (two) times daily.) 90 tablet 3 05/07/2020 at Unknown time   levothyroxine (SYNTHROID) 112 MCG tablet Take 112 mcg by mouth daily before breakfast.   05/07/2020 at Unknown time   pantoprazole (PROTONIX) 40 MG tablet Take 40 mg by mouth daily.   05/07/2020 at Unknown time   Prenatal Vit-Fe Fumarate-FA (PRENATAL PO) Take 1 tablet by mouth daily.   05/07/2020 at Unknown time   sertraline (ZOLOFT) 100 MG tablet Take 150 mg by mouth daily.   05/07/2020 at Unknown time   traMADol (ULTRAM) 50 MG tablet Take 50 mg by mouth 2 (two) times daily as needed for severe pain.   05/07/2020 at Unknown time   cyclobenzaprine (FLEXERIL) 10 MG tablet Take 1 tablet (10 mg total) by mouth every 8 (eight) hours as needed for muscle spasms. (Patient not taking: Reported on 05/08/2020) 30  tablet 1 Not Taking at Unknown time   diphenhydrAMINE (BENADRYL) 25 mg capsule Take 1 capsule (25 mg total) by mouth every 6 (six) hours as needed. (Patient not taking: Reported on 05/08/2020) 30 capsule 0 Not Taking at Unknown time   hydrochlorothiazide (HYDRODIURIL) 25 MG tablet TAKE ONE TABLET BY MOUTH DAILY (Patient not taking: No sig reported) 30 tablet 2 Not Taking at Unknown time   ibuprofen (ADVIL,MOTRIN) 600 MG tablet TAKE ONE TABLET BY MOUTH EVERY 6 HOURS AS NEEDED FOR HEADACHE, MILD PAIN OR CRAMPING (Patient not taking: Reported on 05/08/2020) 30 tablet 0 Not Taking at Unknown time   oxyCODONE-acetaminophen (PERCOCET/ROXICET) 5-325 MG tablet Take 1 tablet by mouth every 6 (six) hours as needed. (Patient not taking: No sig reported) 15 tablet 0 Not Taking at Unknown time   polyethylene glycol powder (GLYCOLAX/MIRALAX) powder Take 17 g by mouth daily as needed for moderate constipation. (Patient not taking: No sig reported) 500 g 2 Not Taking at Unknown time   promethazine (PHENERGAN) 25 MG tablet Take 1 tablet (25 mg total) by mouth every  6 (six) hours as needed for nausea or vomiting. (Patient not taking: No sig reported) 30 tablet 0 Not Taking at Unknown time   sertraline (ZOLOFT) 50 MG tablet Take 1 tablet (50 mg total) by mouth daily. (Patient not taking: No sig reported) 30 tablet 2 Not Taking at Unknown time    Current Facility-Administered Medications  Medication Dose Route Frequency Provider Last Rate Last Admin   [MAR Hold] acetaminophen (TYLENOL) tablet 650 mg  650 mg Oral Q6H PRN Arta Silence, MD   650 mg at 05/09/20 1713   [MAR Hold] ALPRAZolam (XANAX) tablet 0.5 mg  0.5 mg Oral BID PRN Arta Silence, MD   0.5 mg at 05/10/20 2105   [MAR Hold] amLODipine (NORVASC) tablet 10 mg  10 mg Oral Daily Arta Silence, MD   10 mg at 05/11/20 1353   bupivacaine liposome (EXPAREL) 1.3 % injection 266 mg  20 mL Infiltration Once Michael Boston, MD       chlorhexidine (PERIDEX) 0.12 % solution 15 mL  15 mL Mouth/Throat Once Hodierne, Quita Skye, MD       Doug Sou Hold] enoxaparin (LOVENOX) injection 55 mg  55 mg Subcutaneous Q24H Arta Silence, MD   55 mg at 05/10/20 2112   [facility] Hold] hydrALAZINE (APRESOLINE) injection 10 mg  10 mg Intravenous Q4H PRN Arta Silence, MD   10 mg at 05/10/20 0538   [MAR Hold] hydrALAZINE (APRESOLINE) tablet 50 mg  50 mg Oral TID Arta Silence, MD   50 mg at 05/11/20 1353   [MAR Hold] HYDROmorphone (DILAUDID) injection 1 mg  1 mg Intravenous Q2H PRN Arta Silence, MD   1 mg at 05/11/20 1100   [MAR Hold] ipratropium-albuterol (DUONEB) 0.5-2.5 (3) MG/3ML nebulizer solution 3 mL  3 mL Nebulization Q6H PRN Arta Silence, MD       Doug Sou Hold] labetalol (NORMODYNE) tablet 200 mg  200 mg Oral BID Arta Silence, MD   200 mg at 05/11/20 1354   lactated ringers infusion   Intravenous Continuous Hodierne, Adam, MD 100 mL/hr at 05/11/20 1641 New Bag at 05/11/20 1641   [MAR Hold] levothyroxine (SYNTHROID) tablet 112 mcg  112 mcg Oral Q0600 Arta Silence, MD   112 mcg at 05/11/20 0546   [MAR Hold] ondansetron  (ZOFRAN) injection 4 mg  4 mg Intravenous Q6H PRN Arta Silence, MD   4 mg at 05/11/20 1103   [MAR Hold] pantoprazole (PROTONIX) injection  40 mg  40 mg Intravenous Q12H Arta Silence, MD   40 mg at 05/11/20 1355   [MAR Hold] sertraline (ZOLOFT) tablet 150 mg  150 mg Oral Daily Arta Silence, MD   150 mg at 05/11/20 1353   [MAR Hold] sodium chloride flush (NS) 0.9 % injection 3 mL  3 mL Intravenous Q12H Arta Silence, MD   3 mL at 05/10/20 2106     Allergies  Allergen Reactions   Red Dye Other (See Comments)    ALL CAUSE PROBLEMS #40 IS WORST-MIGRAINES    Compazine [Prochlorperazine Maleate] Other (See Comments)    TWITCHING, CANT STAY STILL. Pt states she can tolerate promethazine   Other Other (See Comments)    FANSIDAR FOR MALARIA-ASTHMA   Vicodin [Hydrocodone-Acetaminophen] Itching and Nausea And Vomiting    Pt states she can tolerate acetaminophen   Wheat Bran Other (See Comments)    GLUTEN SENSITIVE   Amitriptyline Hcl     Other reaction(s): angry moods   Duloxetine Hcl     Other reaction(s): passing out, trimble, irritable   Reglan [Metoclopramide] Other (See Comments)    restless   Nifedipine Nausea And Vomiting    Discussed with patient and maternal fetal medicine, patient has intolerance but not frank allergy.    BP 136/90 (BP Location: Left Arm)   Pulse 84   Temp 98 F (36.7 C) (Oral)   Resp 16   Ht 5\' 11"  (1.803 m)   Wt 111.8 kg   LMP 04/30/2020 (Exact Date)   SpO2 99%   BMI 34.38 kg/m   Labs: Results for orders placed or performed during the hospital encounter of 05/08/20 (from the past 48 hour(s))  CBC     Status: Abnormal   Collection Time: 05/10/20  5:58 AM  Result Value Ref Range   WBC 7.3 4.0 - 10.5 K/uL   RBC 4.96 3.87 - 5.11 MIL/uL   Hemoglobin 15.8 (H) 12.0 - 15.0 g/dL   HCT 45.0 36.0 - 46.0 %   MCV 90.7 80.0 - 100.0 fL   MCH 31.9 26.0 - 34.0 pg   MCHC 35.1 30.0 - 36.0 g/dL   RDW 12.6 11.5 - 15.5 %   Platelets 172 150 - 400 K/uL    nRBC 0.0 0.0 - 0.2 %    Comment: Performed at [facility], [place] [address], [place], [place] [address]  Comprehensive metabolic panel     Status: Abnormal   Collection Time: 05/10/20  5:58 AM  Result Value Ref Range   Sodium 138 135 - 145 mmol/L   Potassium 4.0 3.5 - 5.1 mmol/L   Chloride 104 98 - 111 mmol/L   CO2 23 22 - 32 mmol/L   Glucose, Bld 95 70 - 99 mg/dL    Comment: Glucose reference range applies only to samples taken after fasting for at least 8 hours.   BUN 11 6 - 20 mg/dL   Creatinine, Ser 0.90 0.44 - 1.00 mg/dL   Calcium 9.3 8.9 - 10.3 mg/dL   Total Protein 7.1 6.5 - 8.1 g/dL   Albumin 4.3 3.5 - 5.0 g/dL   AST 33 15 - 41 U/L   ALT 25 0 - 44 U/L   Alkaline Phosphatase 56 38 - 126 U/L   Total Bilirubin 1.5 (H) 0.3 - 1.2 mg/dL   GFR, Estimated >60 >60 mL/min    Comment: (NOTE) Calculated using the CKD-EPI Creatinine Equation (2021)    Anion gap 11 5 - 15  Comment: Performed at [facility], [place] [address] [place], [place] [address]  Surgical pathology     Status: None   Collection Time: 05/10/20  2:04 PM  Result Value Ref Range   SURGICAL PATHOLOGY      SURGICAL PATHOLOGY CASE: [id number] PATIENT: [facility] Report     Clinical History: Upper abdominal pain (crm)     FINAL MICROSCOPIC DIAGNOSIS:  A. STOMACH, BIOPSY: - Mild reactive gastropathy. - Warthin-Starry is negative for Helicobacter pylori. - No intestinal metaplasia, dysplasia, or malignancy.  Ziyana Morikawa DESCRIPTION:  Received in formalin are pink-red soft tissue fragments that are submitted in toto. Number: 2.  Size: 0.2 and 0.5 cm.  Blocks: 1  SW 05/10/2020    Final Diagnosis performed by Vicente Males, MD.   Electronically signed 05/11/2020 Technical and / or Professional components performed at [facility], [place] [address], [place], [place] [address].  Immunohistochemistry Technical component (if  applicable) was performed at [facility]. [address], [place], [place], [place] [address].   IMMUNOHISTOCHEMISTRY DISCLAIMER (if applicable): Some of these immunohistochemical stains  may have been developed and the performance characteristics determine by [facility]. Some may not have been cleared or approved by the U.S. Food and Drug Administration. The FDA has determined that such clearance or approval is not necessary. This test is used for clinical purposes. It should not be regarded as investigational or for research. This laboratory is certified under the [place] (CLIA-88) as qualified to perform high complexity clinical laboratory testing.  The controls stained appropriately.   Comprehensive metabolic panel     Status: Abnormal   Collection Time: 05/11/20  5:14 AM  Result Value Ref Range   Sodium 137 135 - 145 mmol/L   Potassium 3.0 (L) 3.5 - 5.1 mmol/L    Comment: DELTA CHECK NOTED   Chloride 103 98 - 111 mmol/L   CO2 23 22 - 32 mmol/L   Glucose, Bld 97 70 - 99 mg/dL    Comment: Glucose reference range applies only to samples taken after fasting for at least 8 hours.   BUN 11 6 - 20 mg/dL   Creatinine, Ser 0.81 0.44 - 1.00 mg/dL   Calcium 9.3 8.9 - 10.3 mg/dL   Total Protein 6.7 6.5 - 8.1 g/dL   Albumin 4.0 3.5 - 5.0 g/dL   AST 21 15 - 41 U/L   ALT 26 0 - 44 U/L   Alkaline Phosphatase 50 38 - 126 U/L   Total Bilirubin 1.2 0.3 - 1.2 mg/dL   GFR, Estimated >60 >60 mL/min    Comment: (NOTE) Calculated using the CKD-EPI Creatinine Equation (2021)    Anion gap 11 5 - 15    Comment: Performed at [facility], [place] [address], [place], [place] [address]    Imaging / Studies: NM Hepato W/EF  Result Date: 05/11/2020 CLINICAL DATA:  Intermittent nausea, vomiting RIGHT upper quadrant pain for 2 years, worsening symptoms for 1 week EXAM: NUCLEAR MEDICINE HEPATOBILIARY IMAGING WITH  GALLBLADDER EF TECHNIQUE: Sequential images of the abdomen were obtained out to 60 minutes following intravenous administration of radiopharmaceutical. After oral ingestion of Ensure, gallbladder ejection fraction was determined. At 60 min, normal ejection fraction is greater than 33%. RADIOPHARMACEUTICALS:  5.2 mCi Tc-29m  Choletec IV COMPARISON:  None. FINDINGS: Normal tracer extraction from bloodstream indicating normal hepatocellular function. Normal excretion of tracer into biliary tree. Gallbladder visualized at 73 min.  Small bowel visualized at 14 min. No hepatic retention of tracer. Extensive patient motion on post Ensure imaging. Subjectively decreased emptying of tracer from gallbladder following fatty meal stimulation. Calculated gallbladder ejection fraction is 27%, suspect over estimating actual gallbladder emptying due to patient motion, subjectively ejection fraction appears less. Patient reported abdominal pain following Ensure ingestion. Normal gallbladder ejection fraction following Ensure ingestion is greater than 33% at 1 hour. IMPRESSION: Patent biliary tree. Abnormal gallbladder response to fatty meal stimulation with a decreased gallbladder emptying following Ensure, as above. Electronically Signed   By: Lavonia Dana M.D.   On: 05/11/2020 11:44   CT Angio Chest/Abd/Pel for Dissection W and/or Wo Contrast  Result Date: 05/08/2020 CLINICAL DATA:  Abdominal pain, aortic dissection suspected. Right upper quadrant pain on going for weeks. EXAM: CT ANGIOGRAPHY CHEST, ABDOMEN AND PELVIS TECHNIQUE: Non-contrast CT of the chest was initially obtained. Multidetector CT imaging through the chest, abdomen and pelvis was performed using the standard protocol during bolus administration of intravenous contrast. Multiplanar reconstructed images and MIPs were obtained and reviewed to evaluate the vascular anatomy. CONTRAST:  146mL OMNIPAQUE IOHEXOL 350 MG/ML SOLN COMPARISON:  Right upper quadrant  ultrasound May 08, 2020 and CT abdomen pelvis October 12, 2015. FINDINGS: CTA CHEST FINDINGS Cardiovascular: No evidence of intramural hematoma on precontrast imaging. Preferential opacification of the thoracic aorta. No evidence of thoracic aortic aneurysm or dissection. Normal heart size. No pericardial effusion. Mediastinum/Nodes: No enlarged mediastinal, hilar, or axillary lymph nodes. Thyroid gland, trachea, and esophagus demonstrate no significant findings. Lungs/Pleura: Lungs are clear. No pleural effusion or pneumothorax. Musculoskeletal: No chest wall abnormality. No acute or significant osseous findings. Review of the MIP images confirms the above findings. CTA ABDOMEN AND PELVIS FINDINGS VASCULAR Aorta: Normal caliber aorta without aneurysm, dissection, vasculitis or significant stenosis. Celiac: Patent without evidence of aneurysm, dissection, vasculitis or significant stenosis. SMA: Patent without evidence of aneurysm, dissection, vasculitis or significant stenosis. Renals: Both renal arteries are patent without evidence of aneurysm, dissection, vasculitis, fibromuscular dysplasia or significant stenosis. IMA: Patent without evidence of aneurysm, dissection, vasculitis or significant stenosis. Inflow: Patent without evidence of aneurysm, dissection, vasculitis or significant stenosis. Veins: No obvious venous abnormality within the limitations of this arterial phase study. Review of the MIP images confirms the above findings. NON-VASCULAR Hepatobiliary: No focal liver abnormality is seen. No gallstones, gallbladder wall thickening, or biliary dilatation. Pancreas: Unremarkable. No pancreatic ductal dilatation or surrounding inflammatory changes. Spleen: Lobular splenic contour with benign-appearing capsular calcifications laterally, similar to prior and likely sequela prior trauma or infection. Adrenals/Urinary Tract: Adrenal glands are unremarkable. Kidneys are normal, without renal calculi, focal  lesion, or hydronephrosis. Bladder is unremarkable. Stomach/Bowel: Stomach is within normal limits. No evidence of bowel wall thickening, distention, or inflammatory changes. Lymphatic: No pathologically enlarged lymph nodes. Reproductive: Uterus and bilateral adnexa are unremarkable. Other: No abdominopelvic ascites. Musculoskeletal: Mild lower lumbar spondylosis. Unchanged size the well-circumscribed 2.9 cm lytic lesion in the left proximal femur which demonstrates stippled internal minimal as matrix, most consistent with a benign enchondroma. Review of the MIP images confirms the above findings. IMPRESSION: 1. No evidence of acute aortic syndrome. 2. No acute findings in the chest, abdomen or pelvis. 3. Gallbladder appears unremarkable. Electronically Signed   By: Dahlia Bailiff MD   On: 05/08/2020 12:55   US Abdomen Limited RUQ (LIVER/GB)  Result Date: 05/08/2020 CLINICAL DATA:  Right upper quadrant pain with nausea and vomiting. EXAM: ULTRASOUND ABDOMEN LIMITED RIGHT UPPER QUADRANT COMPARISON:  Right upper quadrant  ultrasound 12/06/2017 FINDINGS: Gallbladder: No gallstones or wall thickening visualized. No sonographic Murphy sign noted by sonographer. Common bile duct: Diameter: 3 mm Liver: No focal lesion identified. Diffusely increased parenchymal echogenicity. Portal vein is patent on color Doppler imaging with normal direction of blood flow towards the liver. Other: None. IMPRESSION: The echogenicity of the liver is increased. This is a nonspecific finding but is most commonly seen with fatty infiltration of the liver, which in the setting of steatohepatitis can be a cause of abdominal pain. There are no obvious focal liver lesions. Gallbladder is unremarkable. Electronically Signed   By: Dahlia Bailiff MD   On: 05/08/2020 11:00     .Adin Hector, M.D., F.A.C.S. Gastrointestinal and Minimally Invasive Surgery [facility] [address], [place], [place]  [phone] Main / Paging  05/11/2020 6:27 PM    Adin Hector

## 2020-05-11 NOTE — Progress Notes (Signed)
Received pt from PACU. Pt a/ox4, vss stable, diastolic BP elevated. Released new orders, will continue with POC.

## 2020-05-11 NOTE — Discharge Instructions (Signed)
[facility] LAPAROSCOPIC SURGERY: POST OP INSTRUCTIONS Always review your discharge instruction sheet given to you by the facility where your surgery was performed. IF YOU HAVE DISABILITY OR FAMILY LEAVE FORMS, YOU MUST BRING THEM TO THE OFFICE FOR PROCESSING.   DO NOT GIVE THEM TO YOUR DOCTOR.  PAIN CONTROL  1. First take acetaminophen (Tylenol) AND/or ibuprofen (Advil) to control your pain after surgery.  Follow directions on package.  Taking acetaminophen (Tylenol) and/or ibuprofen (Advil) regularly after surgery will help to control your pain and lower the amount of prescription pain medication you may need.  You should not take more than 3,000 mg (3 grams) of acetaminophen (Tylenol) in 24 hours.  You should not take ibuprofen (Advil), aleve, motrin, naprosyn or other NSAIDS if you have a history of stomach ulcers or chronic kidney disease.  2. A prescription for pain medication may be given to you upon discharge.  Take your pain medication as prescribed, if you still have uncontrolled pain after taking acetaminophen (Tylenol) or ibuprofen (Advil). 3. Use ice packs to help control pain. 4. If you need a refill on your pain medication, please contact your pharmacy.  They will contact our office to request authorization. Prescriptions will not be filled after 5pm or on week-ends.  HOME MEDICATIONS 5. Take your usually prescribed medications unless otherwise directed.  DIET 6. You should follow a light diet the first few days after arrival home.  Be sure to include lots of fluids daily. Avoid fatty, fried foods.   CONSTIPATION 7. It is common to experience some constipation after surgery and if you are taking pain medication.  Increasing fluid intake and taking a stool softener (such as Colace) will usually help or prevent this problem from occurring.  A mild laxative (Milk of Magnesia or Miralax) should be taken according to package instructions if there are no bowel  movements after 48 hours.  WOUND/INCISION CARE 8. Most patients will experience some swelling and bruising in the area of the incisions.  Ice packs will help.  Swelling and bruising can take several days to resolve.  9. Unless discharge instructions indicate otherwise, follow guidelines below  a. STERI-STRIPS - you may remove your outer bandages 48 hours after surgery, and you may shower at that time.  You have steri-strips (small skin tapes) in place directly over the incision.  These strips should be left on the skin for 7-10 days.   b. DERMABOND/SKIN GLUE - you may shower in 24 hours.  The glue will flake off over the next 2-3 weeks. 10. Any sutures or staples will be removed at the office during your follow-up visit.  ACTIVITIES 11. You may resume regular (light) daily activities beginning the next day--such as daily self-care, walking, climbing stairs--gradually increasing activities as tolerated.  You may have sexual intercourse when it is comfortable.  Refrain from any heavy lifting or straining until approved by your doctor. a. You may drive when you are no longer taking prescription pain medication, you can comfortably wear a seatbelt, and you can safely maneuver your car and apply brakes.  FOLLOW-UP 12. You should see your doctor in the office for a follow-up appointment approximately 2-3 weeks after your surgery.  You should have been given your post-op/follow-up appointment when your surgery was scheduled.  If you did not receive a post-op/follow-up appointment, make sure that you call for this appointment within a day or two after you arrive home to insure a convenient appointment time.     WHEN TO CALL YOUR DOCTOR: 1. Fever over 101.0 2. Inability to urinate 3. Continued bleeding from incision. 4. Increased pain, redness, or drainage from the incision. 5. Increasing abdominal pain  The clinic staff is available to answer your questions during regular business hours.  Please don't  hesitate to call and ask to speak to one of the nurses for clinical concerns.  If you have a medical emergency, go to the nearest emergency room or call 911.  A surgeon from [facility] is always on call at the hospital. [address], [address], [place], [place]  [address] ? [address] [address], [place], [place]   [address] [phone] ? [phone] ? FAX [phone] Web site: [email or web address]  .........   Managing Your Pain After Surgery Without Opioids    Thank you for participating in our program to help patients manage their pain after surgery without opioids. This is part of our effort to provide you with the best care possible, without exposing you or your family to the risk that opioids pose.  What pain can I expect after surgery? You can expect to have some pain after surgery. This is normal. The pain is typically worse the day after surgery, and quickly begins to get better. Many studies have found that many patients are able to manage their pain after surgery with Over-the-Counter (OTC) medications such as Tylenol and Motrin. If you have a condition that does not allow you to take Tylenol or Motrin, notify your surgical team.  How will I manage my pain? The best strategy for controlling your pain after surgery is around the clock pain control with Tylenol (acetaminophen) and Motrin (ibuprofen or Advil). Alternating these medications with each other allows you to maximize your pain control. In addition to Tylenol and Motrin, you can use heating pads or ice packs on your incisions to help reduce your pain.  How will I alternate your regular strength over-the-counter pain medication? You will take a dose of pain medication every three hours. ; Start by taking 650 mg of Tylenol (2 pills of 325 mg) ; 3 hours later take 600 mg of Motrin (3 pills of 200 mg) ; 3 hours after taking the Motrin take 650 mg of Tylenol ; 3 hours after that take 600 mg of  Motrin.   - 1 -  See example - if your first dose of Tylenol is at 12:00 PM   12:00 PM Tylenol 650 mg (2 pills of 325 mg)  3:00 PM Motrin 600 mg (3 pills of 200 mg)  6:00 PM Tylenol 650 mg (2 pills of 325 mg)  9:00 PM Motrin 600 mg (3 pills of 200 mg)  Continue alternating every 3 hours   We recommend that you follow this schedule around-the-clock for at least 3 days after surgery, or until you feel that it is no longer needed. Use the table on the last page of this handout to keep track of the medications you are taking. Important: Do not take more than 3000mg of Tylenol or 3200mg of Motrin in a 24-hour period. Do not take ibuprofen/Motrin if you have a history of bleeding stomach ulcers, severe kidney disease, &/or actively taking a blood thinner  What if I still have pain? If you have pain that is not controlled with the over-the-counter pain medications (Tylenol and Motrin or Advil) you might have what we call "breakthrough" pain. You will receive a prescription for a small amount of an opioid pain medication such as   Oxycodone, Tramadol, or Tylenol with Codeine. Use these opioid pills in the first 24 hours after surgery if you have breakthrough pain. Do not take more than 1 pill every 4-6 hours.  If you still have uncontrolled pain after using all opioid pills, don't hesitate to call our staff using the number provided. We will help make sure you are managing your pain in the best way possible, and if necessary, we can provide a prescription for additional pain medication.   Day 1    Time  Name of Medication Number of pills taken  Amount of Acetaminophen  Pain Level   Comments  AM PM       AM PM       AM PM       AM PM       AM PM       AM PM       AM PM       AM PM       Total Daily amount of Acetaminophen Do not take more than  3,000 mg per day      Day 2    Time  Name of Medication Number of pills taken  Amount of Acetaminophen  Pain Level   Comments  AM  PM       AM PM       AM PM       AM PM       AM PM       AM PM       AM PM       AM PM       Total Daily amount of Acetaminophen Do not take more than  3,000 mg per day      Day 3    Time  Name of Medication Number of pills taken  Amount of Acetaminophen  Pain Level   Comments  AM PM       AM PM       AM PM       AM PM          AM PM       AM PM       AM PM       AM PM       Total Daily amount of Acetaminophen Do not take more than  3,000 mg per day      Day 4    Time  Name of Medication Number of pills taken  Amount of Acetaminophen  Pain Level   Comments  AM PM       AM PM       AM PM       AM PM       AM PM       AM PM       AM PM       AM PM       Total Daily amount of Acetaminophen Do not take more than  3,000 mg per day      Day 5    Time  Name of Medication Number of pills taken  Amount of Acetaminophen  Pain Level   Comments  AM PM       AM PM       AM PM       AM PM       AM PM       AM PM       AM PM         AM PM       Total Daily amount of Acetaminophen Do not take more than  3,000 mg per day       Day 6    Time  Name of Medication Number of pills taken  Amount of Acetaminophen  Pain Level  Comments  AM PM       AM PM       AM PM       AM PM       AM PM       AM PM       AM PM       AM PM       Total Daily amount of Acetaminophen Do not take more than  3,000 mg per day      Day 7    Time  Name of Medication Number of pills taken  Amount of Acetaminophen  Pain Level   Comments  AM PM       AM PM       AM PM       AM PM       AM PM       AM PM       AM PM       AM PM       Total Daily amount of Acetaminophen Do not take more than  3,000 mg per day        For additional information about how and where to safely dispose of unused opioid medications - https://www.morepowerfulnc.org  Disclaimer: This document contains information and/or instructional materials adapted from [facility]  for the typical patient with your condition. It does not replace medical advice from your health care provider because your experience may differ from that of the typical patient. Talk to your health care provider if you have any questions about this document, your condition or your treatment plan. Adapted from [facility]  

## 2020-05-11 NOTE — Progress Notes (Signed)
   Progress Note  1 Day Post-Op  Subjective: Patient having significant abdominal pain, n/v s/p HIDA. Actively vomiting bilious emesis.   Objective: Vital signs in last 24 hours: Temp:  [97.8 F (36.6 C)-98.8 F (37.1 C)] 98 F (36.7 C) (03/18 0617) Pulse Rate:  [66-101] 66 (03/18 0617) Resp:  [14-19] 15 (03/18 0617) BP: (124-187)/(59-117) 148/80 (03/18 0617) SpO2:  [96 %-100 %] 96 % (03/18 0617)    Intake/Output from previous day: 03/17 0701 - 03/18 0700 In: 839.5 [P.O.:200; I.V.:600; IV Piggyback:39.5] Out: -  Intake/Output this shift: No intake/output data recorded.  PE: General: WD, overweight female who is sitting up and vomiting, crying in pain  Lungs: tachypnea Abd: soft, ttp in epigastrium and RUQ, ND, no masses, hernias, or organomegaly MS: all 4 extremities are symmetrical with no cyanosis, clubbing, or edema.   Lab Results:  Recent Labs    05/09/20 0505 05/10/20 0558  WBC 9.6 7.3  HGB 14.8 15.8*  HCT 44.1 45.0  PLT 336 172   BMET Recent Labs    05/10/20 0558 05/11/20 0514  NA 138 137  K 4.0 3.0*  CL 104 103  CO2 23 23  GLUCOSE 95 97  BUN 11 11  CREATININE 0.90 0.81  CALCIUM 9.3 9.3   PT/INR No results for input(s): LABPROT, INR in the last 72 hours. CMP     Component Value Date/Time   NA 137 05/11/2020 0514   K 3.0 (L) 05/11/2020 0514   CL 103 05/11/2020 0514   CO2 23 05/11/2020 0514   GLUCOSE 97 05/11/2020 0514   BUN 11 05/11/2020 0514   CREATININE 0.81 05/11/2020 0514   CALCIUM 9.3 05/11/2020 0514   PROT 6.7 05/11/2020 0514   ALBUMIN 4.0 05/11/2020 0514   AST 21 05/11/2020 0514   ALT 26 05/11/2020 0514   ALKPHOS 50 05/11/2020 0514   BILITOT 1.2 05/11/2020 0514   GFRNONAA >60 05/11/2020 0514   GFRAA >60 12/18/2017 1844   Lipase     Component Value Date/Time   LIPASE 26 05/08/2020 1024       Studies/Results: No results found.  Anti-infectives: Anti-infectives (From admission, onward)   None        Assessment/Plan Asthma HTN Migraines Anxiety Breast feeding  Abdominal pain The patient has a negative US/CT scan in regards to her gallbladder.  No other acute intra-abdominal abnormalities were found either.  She underwent an EGD yesterday with no acute findings.  Her symptoms have been present for at least 2 years and started with her pregnancies.  Many of her symptoms are c/w biliary disease despite no evidence of cholecystitis or gallstones. Dyskinesia typically doesn't cause pain like this. HIDA done and pending final read planning lap chole pending these results and that this can sometimes fix the pain but sometimes does not fix the pain.  She understands this.    FEN - CLD/NPO p MN VTE - Lovenox ID - none currently  LOS: 3 days    Norm Parcel , [facility] 05/11/2020, 11:43 AM Please see Amion for pager number during day hours 7:00am-4:30pm

## 2020-05-11 NOTE — Anesthesia Preprocedure Evaluation (Signed)
Anesthesia Evaluation  Patient identified by MRN, date of birth, ID band Patient awake    Reviewed: Allergy & Precautions, H&P , NPO status , Patient's Chart, lab work & pertinent test results  Airway Mallampati: II   Neck ROM: full    Dental   Pulmonary asthma ,    breath sounds clear to auscultation       Cardiovascular hypertension,  Rhythm:regular Rate:Normal     Neuro/Psych  Headaches, PSYCHIATRIC DISORDERS Anxiety    GI/Hepatic   Endo/Other    Renal/GU stones     Musculoskeletal   Abdominal   Peds  Hematology   Anesthesia Other Findings   Reproductive/Obstetrics                             Anesthesia Physical Anesthesia Plan  ASA: II  Anesthesia Plan: General   Post-op Pain Management:    Induction: Intravenous  PONV Risk Score and Plan: 3 and Ondansetron, Dexamethasone, Midazolam and Treatment may vary due to age or medical condition  Airway Management Planned: Oral ETT  Additional Equipment:   Intra-op Plan:   Post-operative Plan: Extubation in OR  Informed Consent: I have reviewed the patients History and Physical, chart, labs and discussed the procedure including the risks, benefits and alternatives for the proposed anesthesia with the patient or authorized representative who has indicated his/her understanding and acceptance.     Dental advisory given  Plan Discussed with: CRNA, Anesthesiologist and Surgeon  Anesthesia Plan Comments:         Anesthesia Quick Evaluation

## 2020-05-11 NOTE — Anesthesia Procedure Notes (Signed)
Procedure Name: Intubation Date/Time: 05/11/2020 6:45 PM Performed by: Gerald Leitz, CRNA Pre-anesthesia Checklist: Patient identified, Patient being monitored, Timeout performed, Emergency Drugs available and Suction available Patient Re-evaluated:Patient Re-evaluated prior to induction Oxygen Delivery Method: Circle system utilized Preoxygenation: Pre-oxygenation with 100% oxygen Induction Type: IV induction Ventilation: Mask ventilation without difficulty Laryngoscope Size: Mac and 3 Grade View: Grade I Tube type: Oral Tube size: 7.0 mm Number of attempts: 1 Placement Confirmation: ETT inserted through vocal cords under direct vision,  positive ETCO2 and breath sounds checked- equal and bilateral Secured at: 22 cm Tube secured with: Tape Dental Injury: Teeth and Oropharynx as per pre-operative assessment

## 2020-05-11 NOTE — Op Note (Signed)
05/11/2020  PATIENT:  Krystal [name]  30 y.o. female  Patient Care Team: Maurice Small, MD as PCP - General (Family Medicine)  PRE-OPERATIVE DIAGNOSIS:    Acute on Chronic Cholecystitis  POST-OPERATIVE DIAGNOSIS:   Acute on Chronic Cholecystitis  Liver: Fatty steatohepatitis  PROCEDURE:  Core Liver Biopsy (CPT code 47001) & SINGLE SITE Laparoscopic cholecystectomy (CPT code [phone])  SURGEON:  Adin Hector, MD, FACS.  ASSISTANT: OR Staff   ANESTHESIA:    General with endotracheal intubation Local anesthetic as a field block  EBL:  (See Anesthesia Intraoperative Record) No intake/output data recorded.  Delay start of Pharmacological VTE agent (>24hrs) due to surgical blood loss or risk of bleeding:  no  DRAINS: None   SPECIMEN: Gallbladder & Core liver biopsies    DISPOSITION OF SPECIMEN:  PATHOLOGY  COUNTS:  YES  PLAN OF CARE: Admit to inpatient   PATIENT DISPOSITION:  PACU - hemodynamically stable.  INDICATION: Woman with intermittent nausea vomiting abdominal pain focus in the right upper quadrant.  No evidence of any gallstones but markedly decreased gallbladder ejection fraction with severe symptoms on nuclear medicine HIDA scan.  Not able to keep any liquids down.  Concerning for end-stage biliary dyskinesia with a calculus chronic cholecystitis.  I recommended cholecystectomy.  The anatomy & physiology of hepatobiliary & pancreatic function was discussed.  The pathophysiology of gallbladder dysfunction was discussed.  Natural history risks without surgery was discussed.   I feel the risks of no intervention will lead to serious problems that outweigh the operative risks; therefore, I recommended cholecystectomy to remove the pathology.  I explained laparoscopic techniques with possible need for an open approach.  Probable cholangiogram to evaluate the bilary tract was explained as well.    Risks such as bleeding, infection, abscess, leak, injury to other  organs, need for further treatment, heart attack, death, and other risks were discussed.  I noted a good likelihood this will help address the problem.  Possibility that this will not correct all abdominal symptoms was explained.  Goals of post-operative recovery were discussed as well.  We will work to minimize complications.  An educational handout further explaining the pathology and treatment options was given as well.  Questions were answered.  The patient expresses understanding & wishes to proceed with surgery.  OR FINDINGS: Turgid gallbladder dilated with thickening consistent with chronic cholecystitis.  Some edema suspicious for early acute cholecystitis as well.  Liver: Fatty steatohepatitis  DESCRIPTION:   The patient was identified & brought in the operating room. The patient was positioned supine with arms tucked. SCDs were active during the entire case. The patient underwent general anesthesia without any difficulty.  The abdomen was prepped and draped in a sterile fashion. A Surgical Timeout confirmed our plan.  I made a transverse curvilinear incision through the superior umbilical fold.  I placed a 25mm long port through the supraumbilical fascia using a modified Hassan cutdown technique with umbilical stalk fascial countertraction. I began carbon dioxide insufflation.  No change in end tidal CO2 measurement.   Camera inspection revealed no injury. There were no adhesions to the anterior abdominal wall supraumbilically.  I proceeded to continue with single site technique. I placed a #5 port in left upper aspect of the wound. I placed a 5 mm atraumatic grasper in the right inferior aspect of the wound.  I turned attention to the right upper quadrant.  Gallbladder was dilated but there was no major phlegmon nor any gangrene or ischemia.  Fatty liver noted.  The gallbladder fundus was elevated cephalad. I freed adhesions to the ventral surface of the gallbladder off carefully.  I freed the  peritoneal coverings between the gallbladder and the liver on the posteriolateral and anteriomedial walls. I alternated between Harmonic & blunt Maryland dissection to help get a good critical view of the cystic artery and cystic duct.  did further dissection to free all of the gallbladder off the liver bed to get a good critical view of the infundibulum and cystic duct. I dissected out the cystic artery; and, after getting a good 360 view, ligated the anterior & posterior branches of the cystic artery close on the infundibulum using the Harmonic ultrasonic dissection.  I skeletonized the cystic duct.  I placed clips on the cystic duct x4.  I completed cystic duct transection.  Because of fatty change in liver with her sharp abdominal pain issues and not horrible appearing gallbladder, decided to do core needle biopsies.  I used a 14-gauge Tru-Cut and did 3 passes on the right anterior hepatic lobe and got 1 excellent into good cores.  Assured hemostasis.  I ensured hemostasis on the gallbladder fossa of the liver and elsewhere. I inspected the rest of the abdomen & detected no injury nor bleeding elsewhere.  I placed gallbladder inside an Ecosac and it out the supraumbilical fascia. I closed the fascia transversely using  #1 PDS interrupted stitches. I closed the skin using 4-0 monocryl stitch.  Sterile dressing was applied. The patient was extubated & arrived in the PACU in stable condition..  I had discussed postoperative care with the patient in the holding area.  Most likely she will not require antibiotics beyond overnight I discussed operative findings, updated the patient's status, discussed probable steps to recovery, and gave postoperative recommendations to the patient's spouse, Aaron Edelman Cerny.  Recommendations were made.  Questions were answered.  He expressed understanding & appreciation.  Adin Hector, M.D., F.A.C.S. Gastrointestinal and Minimally Invasive Surgery [facility] [address] [place], [place] [address] [phone] Main / Paging  05/11/2020 7:41 PM

## 2020-05-11 NOTE — Progress Notes (Signed)
Pt ambulated hallway.

## 2020-05-11 NOTE — Progress Notes (Signed)
[facility] PROGRESS NOTE   Krystal [name] KDT:[id number] DOB: Jul 17, 1990 DOA: 05/08/2020  PCP: Maurice Small, MD  Brief History/Interval Summary: 30 year old female with past medical history of obesity, migraines, kidney stones, hypertension, interstitial cystitis, asthma, appendectomy, necrotizing fasciitis s/p left hand amputation, HELLP syndrome who presented to the ED with RUQ abdominal pain.  She has been dealing with intermittent RUQ abdominal pain since her pregnancy but had improved after giving birth 7 months ago. Since then it has been intermittent however over the past week it has acutely worsened.   Consultants: [facility]  Procedures:  EGD 3/17 Impression:               - Normal esophagus.                           - Gastritis. Biopsied.                           - Normal duodenal bulb, first portion of the                            duodenum and second portion of the duodenum.                           - No explanation for patient's abdominal pain seen     Antibiotics: Anti-infectives (From admission, onward)   None      Subjective/Interval History: Patient seen after she returned from her HIDA scan study.  Continues to have significant pain in the right upper abdomen as well as significant nausea and episodes of vomiting.  No changes noted.    Assessment/Plan:  Intractable nausea and vomiting with right upper quadrant abdominal pain CTA of chest abdomen pelvis was unremarkable.  Ultrasound showed suspected fatty liver, possible steatohepatitis. GB was noted to be unremarkable.  Patient with significant tenderness in the right upper quadrant.  LFTs are surprisingly normal.  Lipase level was normal.   Patient seen by gastroenterology.  Patient underwent EGD which did not show any findings that would explain her abdominal symptoms.  Gastritis was noted.  Biopsies were taken.  Pathology is pending. General surgery was subsequently consulted  as it was felt that her symptoms could be gallbladder in origin.  HIDA scan was ordered by general surgery.  They are considering a cholecystectomy either later today or tomorrow. PPI being continued.   Accelerated hypertension Likely due to the fact that she has been unable to take her oral medications due to her nausea and vomiting along with pain issues.   Amlodipine and hydralazine was continued.  Subsequently labetalol was also resumed.  Blood pressure seems to be better controlled.  We will need to watch her closely during the perioperative period.  History of asthma Stable.  Hypokalemia To be repleted.  Check magnesium.  Lactating Patient delivered her child 7 months ago.  Continues to breast-feed.  History of depression and anxiety Patient mentions that she takes Zoloft at home.  This was resumed  Obesity Estimated body mass index is 34.38 kg/m as calculated from the following:   Height as of this encounter: 5\' 11"  (1.803 m).   Weight as of this encounter: 111.8 kg.   DVT Prophylaxis: Lovenox Code Status: Full code Family Communication: Discussed with the patient Disposition Plan: Hopefully return home when improved  Status is: Inpatient  Remains inpatient appropriate because:Ongoing active pain requiring inpatient pain management and IV treatments appropriate due to intensity of illness or inability to take PO   Dispo: The patient is from: Home              Anticipated d/c is to: Home              Patient currently is not medically stable to d/c.   Difficult to place patient No     Medications:  Scheduled: . amLODipine  10 mg Oral Daily  . enoxaparin (LOVENOX) injection  55 mg Subcutaneous Q24H  . hydrALAZINE  50 mg Oral TID  . labetalol  200 mg Oral BID  . levothyroxine  112 mcg Oral Q0600  . pantoprazole (PROTONIX) IV  40 mg Intravenous Q12H  . sertraline  150 mg Oral Daily  . sodium chloride flush  3 mL Intravenous Q12H    Continuous:  EQA:[id number], ALPRAZolam, hydrALAZINE, HYDROmorphone (DILAUDID) injection, ipratropium-albuterol, ondansetron (ZOFRAN) IV, prochlorperazine   Objective:  Vital Signs  Vitals:   05/10/20 1829 05/10/20 2048 05/10/20 2325 05/11/20 0617  BP: (!) 152/97 (!) 184/109 138/88 (!) 148/80  Pulse: 77 82 68 66  Resp: 14 14  15   Temp: 98 F (36.7 C) 98.7 F (37.1 C)  98 F (36.7 C)  TempSrc: Oral Oral    SpO2: 97% 97%  96%  Weight:      Height:        Intake/Output Summary (Last 24 hours) at 05/11/2020 1250 Last data filed at 05/11/2020 0550 Gross per 24 hour  Intake 839.45 ml  Output --  Net 839.45 ml   Filed Weights   05/08/20 1001 05/08/20 1813  Weight: 117.9 kg 111.8 kg    General appearance: Awake alert.  In no distress Resp: Clear to auscultation bilaterally.  Normal effort Cardio: S1-S2 is normal regular.  No S3-S4.  No rubs murmurs or bruit GI: Abdomen is soft.  Remains tender in the upper abdomen, epigastrium as well as right upper quadrant.  Extremities: No edema.  Full range of motion of lower extremities. Neurologic: Alert and oriented x3.  No focal neurological deficits.     Lab Results:  Data Reviewed: I have personally reviewed following labs and imaging studies  CBC: Recent Labs  Lab 05/08/20 1024 05/09/20 0505 05/10/20 0558  WBC 7.1 9.6 7.3  NEUTROABS 4.3  --   --   HGB 14.4 14.8 15.8*  HCT 41.8 44.1 45.0  MCV 92.3 94.4 90.7  PLT 326 336 921    Basic Metabolic Panel: Recent Labs  Lab 05/08/20 1024 05/08/20 1406 05/09/20 0505 05/10/20 0558 05/11/20 0514  NA 142  --  137 138 137  K 3.3*  --  3.6 4.0 3.0*  CL 108  --  104 104 103  CO2 26  --  21* 23 23  GLUCOSE 96  --  129* 95 97  BUN 12  --  12 11 11   CREATININE 0.79  --  0.73 0.90 0.81  CALCIUM 8.6*  --  9.2 9.3 9.3  MG  --  2.0  --   --   --     GFR: Estimated Creatinine Clearance: 138.5 mL/min (by C-G formula based on SCr of 0.81 mg/dL).  Liver Function  Tests: Recent Labs  Lab 05/08/20 1024 05/09/20 0505 05/10/20 0558 05/11/20 0514  AST 22 23 33 21  ALT 29 28 25 26   ALKPHOS 55 58 56 50  BILITOT 0.7  1.1 1.5* 1.2  PROT 6.7 7.4 7.1 6.7  ALBUMIN 4.2 4.3 4.3 4.0    Recent Labs  Lab 05/08/20 1024  LIPASE 26     Recent Results (from the past 240 hour(s))  Resp Panel by RT-PCR (Flu A&B, Covid) Nasopharyngeal Swab     Status: None   Collection Time: 05/08/20  4:19 PM   Specimen: Nasopharyngeal Swab; Nasopharyngeal(NP) swabs in vial transport medium  Result Value Ref Range Status   SARS Coronavirus 2 by RT PCR NEGATIVE NEGATIVE Final    Comment: (NOTE) SARS-CoV-2 target nucleic acids are NOT DETECTED.  The SARS-CoV-2 RNA is generally detectable in upper respiratory specimens during the acute phase of infection. The lowest concentration of SARS-CoV-2 viral copies this assay can detect is 138 copies/mL. A negative result does not preclude SARS-Cov-2 infection and should not be used as the sole basis for treatment or other patient management decisions. A negative result may occur with  improper specimen collection/handling, submission of specimen other than nasopharyngeal swab, presence of viral mutation(s) within the areas targeted by this assay, and inadequate number of viral copies(<138 copies/mL). A negative result must be combined with clinical observations, patient history, and epidemiological information. The expected result is Negative.  Fact Sheet for Patients:  EntrepreneurPulse.com.au  Fact Sheet for Healthcare Providers:  IncredibleEmployment.be  This test is no t yet approved or cleared by the [place] FDA and  has been authorized for detection and/or diagnosis of SARS-CoV-2 by FDA under an Emergency Use Authorization (EUA). This EUA will remain  in effect (meaning this test can be used) for the duration of the COVID-19 declaration under Section 564(b)(1) of the Act,  21 U.S.C.section 360bbb-3(b)(1), unless the authorization is terminated  or revoked sooner.       Influenza A by PCR NEGATIVE NEGATIVE Final   Influenza B by PCR NEGATIVE NEGATIVE Final    Comment: (NOTE) The Xpert Xpress SARS-CoV-2/FLU/RSV plus assay is intended as an aid in the diagnosis of influenza from Nasopharyngeal swab specimens and should not be used as a sole basis for treatment. Nasal washings and aspirates are unacceptable for Xpert Xpress SARS-CoV-2/FLU/RSV testing.  Fact Sheet for Patients: EntrepreneurPulse.com.au  Fact Sheet for Healthcare Providers: IncredibleEmployment.be  This test is not yet approved or cleared by the [place] FDA and has been authorized for detection and/or diagnosis of SARS-CoV-2 by FDA under an Emergency Use Authorization (EUA). This EUA will remain in effect (meaning this test can be used) for the duration of the COVID-19 declaration under Section 564(b)(1) of the Act, 21 U.S.C. section 360bbb-3(b)(1), unless the authorization is terminated or revoked.  Performed at [facility], [place] [address], [place], [place] [address]       Radiology Studies: NM Hepato W/EF  Result Date: 05/11/2020 CLINICAL DATA:  Intermittent nausea, vomiting RIGHT upper quadrant pain for 2 years, worsening symptoms for 1 week EXAM: NUCLEAR MEDICINE HEPATOBILIARY IMAGING WITH GALLBLADDER EF TECHNIQUE: Sequential images of the abdomen were obtained out to 60 minutes following intravenous administration of radiopharmaceutical. After oral ingestion of Ensure, gallbladder ejection fraction was determined. At 60 min, normal ejection fraction is greater than 33%. RADIOPHARMACEUTICALS:  5.2 mCi Tc-8m  Choletec IV COMPARISON:  None. FINDINGS: Normal tracer extraction from bloodstream indicating normal hepatocellular function. Normal excretion of tracer into biliary tree. Gallbladder visualized at 73 min. Small bowel  visualized at 14 min. No hepatic retention of tracer. Extensive patient motion on post Ensure imaging. Subjectively decreased emptying of tracer from gallbladder  following fatty meal stimulation. Calculated gallbladder ejection fraction is 27%, suspect over estimating actual gallbladder emptying due to patient motion, subjectively ejection fraction appears less. Patient reported abdominal pain following Ensure ingestion. Normal gallbladder ejection fraction following Ensure ingestion is greater than 33% at 1 hour. IMPRESSION: Patent biliary tree. Abnormal gallbladder response to fatty meal stimulation with a decreased gallbladder emptying following Ensure, as above. Electronically Signed   By: Lavonia Dana M.D.   On: 05/11/2020 11:44       LOS: 3 days   [facility] Pager on [email or web address]  05/11/2020, 12:50 PM

## 2020-05-11 NOTE — Progress Notes (Signed)
1X3 order of Potassium was not administered. Pt unable to tolerate. MD notified.

## 2020-05-11 NOTE — Plan of Care (Signed)
Appreciate surgical team input.  Awaiting gastric biopsies, but any findings here do not explain her pain.  [facility] will sign-off; please call with questions; thank you for the consultation.

## 2020-05-11 NOTE — Transfer of Care (Signed)
Immediate Anesthesia Transfer of Care Note  Patient: Krystal [name]  Procedure(s) Performed: Procedure(s): SINGLE SITE LAPAROSCOPIC CHOLECYSTECTOMY AND LIVER BIOSY (N/A)  Patient Location: PACU  Anesthesia Type:General  Level of Consciousness: Alert, Awake, Oriented  Airway & Oxygen Therapy: Patient Spontanous Breathing  Post-op Assessment: Report given to RN  Post vital signs: Reviewed and stable  Last Vitals:  Vitals:   05/11/20 1353 05/11/20 1603  BP: (!) 177/113 136/90  Pulse: 72 84  Resp:  16  Temp:  36.7 C  SpO2:  64%    Complications: No apparent anesthesia complications

## 2020-05-12 ENCOUNTER — Inpatient Hospital Stay (HOSPITAL_COMMUNITY): Payer: 59

## 2020-05-12 LAB — COMPREHENSIVE METABOLIC PANEL
ALT: 59 U/L — ABNORMAL HIGH (ref 0–44)
AST: 60 U/L — ABNORMAL HIGH (ref 15–41)
Albumin: 4.4 g/dL (ref 3.5–5.0)
Alkaline Phosphatase: 54 U/L (ref 38–126)
Anion gap: 13 (ref 5–15)
BUN: 13 mg/dL (ref 6–20)
CO2: 24 mmol/L (ref 22–32)
Calcium: 9.6 mg/dL (ref 8.9–10.3)
Chloride: 100 mmol/L (ref 98–111)
Creatinine, Ser: 0.92 mg/dL (ref 0.44–1.00)
GFR, Estimated: >60 mL/min (ref >60)
Glucose, Bld: 168 mg/dL — ABNORMAL HIGH (ref 70–99)
Potassium: 3.6 mmol/L (ref 3.5–5.1)
Sodium: 137 mmol/L (ref 135–145)
Total Bilirubin: 0.5 mg/dL (ref 0.3–1.2)
Total Protein: 7.6 g/dL (ref 6.5–8.1)

## 2020-05-12 LAB — CBC
HCT: 43.6 % (ref 36.0–46.0)
Hemoglobin: 15.1 g/dL — ABNORMAL HIGH (ref 12.0–15.0)
MCH: 31.6 pg (ref 26.0–34.0)
MCHC: 34.6 g/dL (ref 30.0–36.0)
MCV: 91.2 fL (ref 80.0–100.0)
Platelets: 353 10*3/uL (ref 150–400)
RBC: 4.78 MIL/uL (ref 3.87–5.11)
RDW: 12.2 % (ref 11.5–15.5)
WBC: 6.4 10*3/uL (ref 4.0–10.5)
nRBC: 0 % (ref 0.0–0.2)

## 2020-05-12 LAB — MAGNESIUM: Magnesium: 1.9 mg/dL (ref 1.7–2.4)

## 2020-05-12 MED ORDER — HYDRALAZINE HCL 50 MG PO TABS
100.0000 mg | ORAL_TABLET | Freq: Three times a day (TID) | ORAL | Status: DC
  Administered 2020-05-12 – 2020-05-14 (×6): 100 mg via ORAL
  Filled 2020-05-12 (×6): qty 2

## 2020-05-12 MED ORDER — LABETALOL HCL 300 MG PO TABS
300.0000 mg | ORAL_TABLET | Freq: Two times a day (BID) | ORAL | Status: DC
  Administered 2020-05-12 – 2020-05-14 (×4): 300 mg via ORAL
  Filled 2020-05-12 (×4): qty 1

## 2020-05-12 MED ORDER — ACETAMINOPHEN 500 MG PO TABS
1000.0000 mg | ORAL_TABLET | Freq: Four times a day (QID) | ORAL | Status: DC
  Administered 2020-05-12 – 2020-05-14 (×7): 1000 mg via ORAL
  Filled 2020-05-12 (×7): qty 2

## 2020-05-12 MED ORDER — KETOROLAC TROMETHAMINE 15 MG/ML IJ SOLN
15.0000 mg | Freq: Three times a day (TID) | INTRAMUSCULAR | Status: DC | PRN
  Administered 2020-05-12 – 2020-05-13 (×2): 15 mg via INTRAVENOUS
  Filled 2020-05-12 (×5): qty 1

## 2020-05-12 MED ORDER — HYDROMORPHONE HCL 1 MG/ML IJ SOLN
1.0000 mg | Freq: Once | INTRAMUSCULAR | Status: AC
  Administered 2020-05-12: 1 mg via INTRAVENOUS
  Filled 2020-05-12: qty 1

## 2020-05-12 MED ORDER — HYDROMORPHONE HCL 1 MG/ML IJ SOLN
1.0000 mg | INTRAMUSCULAR | Status: DC | PRN
  Administered 2020-05-12 – 2020-05-13 (×3): 1 mg via INTRAVENOUS
  Filled 2020-05-12 (×3): qty 1

## 2020-05-12 MED ORDER — SUCRALFATE 1 GM/10ML PO SUSP
1.0000 g | Freq: Three times a day (TID) | ORAL | Status: DC
  Administered 2020-05-12 – 2020-05-14 (×8): 1 g via ORAL
  Filled 2020-05-12 (×9): qty 10

## 2020-05-12 MED ORDER — SENNOSIDES-DOCUSATE SODIUM 8.6-50 MG PO TABS
2.0000 | ORAL_TABLET | Freq: Two times a day (BID) | ORAL | Status: DC
  Administered 2020-05-12 – 2020-05-14 (×5): 2 via ORAL
  Filled 2020-05-12 (×6): qty 2

## 2020-05-12 NOTE — Plan of Care (Signed)

## 2020-05-12 NOTE — Progress Notes (Signed)
PT Cancellation Note  Patient Details Name: Krystal [name] MRN: [id number] DOB: October 18, 1990   Cancelled Treatment:    Reason Eval/Treat Not Completed: PT screened, no needs identified, will sign off Pt dressed and ambulating in hallway while talking on the phone.  Pt does not appear to have skilled acute PT needs at this time.  PT to sign off.   Kemal Amores,KATHrine E 05/12/2020, 9:35 AM Jannette Spanner PT, DPT Acute Rehabilitation Services Pager: [phone] Office: [phone]

## 2020-05-12 NOTE — Progress Notes (Signed)
1 Day Post-Op   Subjective/Chief Complaint: No bm since Monday she says, no n/v, tol clears, abd pain maybe different than preop   Objective: Vital signs in last 24 hours: Temp:  [97.6 F (36.4 C)-98.9 F (37.2 C)] 98.3 F (36.8 C) (03/19 1016) Pulse Rate:  [60-106] 83 (03/19 1016) Resp:  [9-18] 16 (03/19 1016) BP: (136-203)/(83-116) 176/105 (03/19 1016) SpO2:  [96 %-100 %] 99 % (03/19 1016)    Intake/Output from previous day: 03/18 0701 - 03/19 0700 In: 291.6 [I.V.:191.6; IV Piggyback:100] Out: 10 [Blood:10] Intake/Output this shift: No intake/output data recorded.  GI: soft dressings dry approp tender  Lab Results:  Recent Labs    05/10/20 0558 05/12/20 0537  WBC 7.3 6.4  HGB 15.8* 15.1*  HCT 45.0 43.6  PLT 172 353   BMET Recent Labs    05/11/20 0514 05/12/20 0537  NA 137 137  K 3.0* 3.6  CL 103 100  CO2 23 24  GLUCOSE 97 168*  BUN 11 13  CREATININE 0.81 0.92  CALCIUM 9.3 9.6   PT/INR No results for input(s): LABPROT, INR in the last 72 hours. ABG No results for input(s): PHART, HCO3 in the last 72 hours.  Invalid input(s): PCO2, PO2  Studies/Results: NM Hepato W/EF  Result Date: 05/11/2020 CLINICAL DATA:  Intermittent nausea, vomiting RIGHT upper quadrant pain for 2 years, worsening symptoms for 1 week EXAM: NUCLEAR MEDICINE HEPATOBILIARY IMAGING WITH GALLBLADDER EF TECHNIQUE: Sequential images of the abdomen were obtained out to 60 minutes following intravenous administration of radiopharmaceutical. After oral ingestion of Ensure, gallbladder ejection fraction was determined. At 60 min, normal ejection fraction is greater than 33%. RADIOPHARMACEUTICALS:  5.2 mCi Tc-81m  Choletec IV COMPARISON:  None. FINDINGS: Normal tracer extraction from bloodstream indicating normal hepatocellular function. Normal excretion of tracer into biliary tree. Gallbladder visualized at 73 min. Small bowel visualized at 14 min. No hepatic retention of tracer. Extensive  patient motion on post Ensure imaging. Subjectively decreased emptying of tracer from gallbladder following fatty meal stimulation. Calculated gallbladder ejection fraction is 27%, suspect over estimating actual gallbladder emptying due to patient motion, subjectively ejection fraction appears less. Patient reported abdominal pain following Ensure ingestion. Normal gallbladder ejection fraction following Ensure ingestion is greater than 33% at 1 hour. IMPRESSION: Patent biliary tree. Abnormal gallbladder response to fatty meal stimulation with a decreased gallbladder emptying following Ensure, as above. Electronically Signed   By: Lavonia Dana M.D.   On: 05/11/2020 11:44    Anti-infectives: Anti-infectives (From admission, onward)   Start     Dose/Rate Route Frequency Ordered Stop   05/12/20 1800  cefTRIAXone (ROCEPHIN) 2 g in sodium chloride 0.9 % 100 mL IVPB       Note to Pharmacy: Pharmacy may adjust dosing strength / duration / interval for maximal efficacy   2 g 200 mL/hr over 30 Minutes Intravenous Every 24 hours 05/11/20 1837     05/11/20 1845  cefTRIAXone (ROCEPHIN) 2 g in sodium chloride 0.9 % 100 mL IVPB       Note to Pharmacy: Pharmacy may adjust dosing strength, interval, or rate of medication as needed for optimal therapy for the patient Send with patient on call to the OR.  Anesthesia to complete antibiotic administration <4min prior to incision per [facility].   2 g 200 mL/hr over 30 Minutes Intravenous On call to O.R. 05/11/20 1837 05/11/20 1854   05/11/20 1837  sodium chloride 0.9 % with cefTRIAXone (ROCEPHIN) ADS Med  Note to Pharmacy: Nash Dimmer   : cabinet override      05/11/20 1837 05/11/20 1903      Assessment/Plan: POD 1 lap chole-dyskinesia- Gross -regular diet -bowel regimen -can dc home when medicine ready to dc   Rolm Bookbinder 05/12/2020

## 2020-05-12 NOTE — Progress Notes (Signed)
[facility] PROGRESS NOTE   Krystal [name] OBS:[id number] DOB: [birth year] DOA: 05/08/2020  PCP: Maurice Small, MD  Brief History/Interval Summary: 30 year old female with past medical history of obesity, migraines, kidney stones, hypertension, interstitial cystitis, asthma, appendectomy, necrotizing fasciitis s/p left hand amputation, HELLP syndrome who presented to the ED with RUQ abdominal pain.  She has been dealing with intermittent RUQ abdominal pain since her pregnancy but had improved after giving birth 7 months ago. Since then it has been intermittent however over the past week it has acutely worsened.   Consultants: [facility]  Procedures:  EGD 3/17 Impression:               - Normal esophagus.                           - Gastritis. Biopsied.                           - Normal duodenal bulb, first portion of the                            duodenum and second portion of the duodenum.                           - No explanation for patient's abdominal pain seen     Antibiotics: Anti-infectives (From admission, onward)   Start     Dose/Rate Route Frequency Ordered Stop   05/12/20 1800  cefTRIAXone (ROCEPHIN) 2 g in sodium chloride 0.9 % 100 mL IVPB       Note to Pharmacy: Pharmacy may adjust dosing strength / duration / interval for maximal efficacy   2 g 200 mL/hr over 30 Minutes Intravenous Every 24 hours 05/11/20 1837     05/11/20 1845  cefTRIAXone (ROCEPHIN) 2 g in sodium chloride 0.9 % 100 mL IVPB       Note to Pharmacy: Pharmacy may adjust dosing strength, interval, or rate of medication as needed for optimal therapy for the patient Send with patient on call to the OR.  Anesthesia to complete antibiotic administration <97min prior to incision per [facility].   2 g 200 mL/hr over 30 Minutes Intravenous On call to O.R. 05/11/20 1837 05/11/20 1854   05/11/20 1837  sodium chloride 0.9 % with cefTRIAXone (ROCEPHIN) ADS Med       Note to Pharmacy:  Nash Dimmer   : cabinet override      05/11/20 1837 05/11/20 1903      Subjective/Interval History: Patient underwent cholecystectomy yesterday.  Abdominal pain is better though his fusion continues to follow.  Continues to have some nausea.      Assessment/Plan:  Intractable nausea and vomiting with right upper quadrant abdominal pain/biliary dyskinesis CTA of chest abdomen pelvis was unremarkable.  Ultrasound showed suspected fatty liver, possible steatohepatitis. GB was noted to be unremarkable.  Patient with significant tenderness in the right upper quadrant.  LFTs are surprisingly normal.  Lipase level was normal.   Patient seen by gastroenterology.  Patient underwent EGD which did not show any findings that would explain her abdominal symptoms.  Gastritis was noted.  Biopsies were taken.  Pathology shows mild gastropathy.  No H. Pylori. General surgery was subsequently consulted.  HIDA scan was done.  Patient underwent laparoscopic cholecystectomy yesterday.  Symptoms seem to be  slightly better.  Continue to mobilize.  Advance diet per general surgery.  Continue PPI.  Accelerated hypertension Elevated blood pressure most likely due to significant pain issues as well as her baseline history.  Blood pressure remains poorly controlled.  She is being continued on amlodipine and hydralazine and labetalol.  We will go up on the dose of her hydralazine and labetalol today to get better blood pressure control.    History of asthma Stable.  Hypokalemia Potassium is better.  Magnesium is 1.9.  Lactating Patient delivered her child 7 months ago.  Continues to breast-feed.  History of depression and anxiety Continue Zoloft.  Obesity Estimated body mass index is 34.38 kg/m as calculated from the following:   Height as of this encounter: 5\' 11"  (1.803 m).   Weight as of this encounter: 111.8 kg.   DVT Prophylaxis: Lovenox Code Status: Full code Family Communication: Discussed with  the patient Disposition Plan: Hopefully return home when improved  Status is: Inpatient  Remains inpatient appropriate because:Ongoing active pain requiring inpatient pain management and IV treatments appropriate due to intensity of illness or inability to take PO   Dispo: The patient is from: Home              Anticipated d/c is to: Home              Patient currently is not medically stable to d/c.   Difficult to place patient No     Medications:  Scheduled: . amLODipine  10 mg Oral Daily  . enoxaparin (LOVENOX) injection  55 mg Subcutaneous Q24H  . hydrALAZINE  50 mg Oral TID  . labetalol  200 mg Oral BID  . levothyroxine  112 mcg Oral Q0600  . lip balm  1 application Topical BID  . pantoprazole (PROTONIX) IV  40 mg Intravenous Q12H  . polycarbophil  625 mg Oral BID  . sertraline  150 mg Oral Daily  . sodium chloride flush  3 mL Intravenous Q12H  . sodium chloride flush  3 mL Intravenous Q12H   Continuous: . sodium chloride    . cefTRIAXone (ROCEPHIN)  IV    . lactated ringers     EXN:TZGYFV chloride, acetaminophen, ALPRAZolam, alum & mag hydroxide-simeth, bisacodyl, diphenhydrAMINE, enalaprilat, hydrALAZINE, HYDROmorphone (DILAUDID) injection, ipratropium-albuterol, lactated ringers, magic mouthwash, ondansetron (ZOFRAN) IV, oxyCODONE, prochlorperazine, simethicone, sodium chloride flush   Objective:  Vital Signs  Vitals:   05/12/20 0635 05/12/20 1003 05/12/20 1016 05/12/20 1058  BP: (!) 179/107 (!) 175/96 (!) 176/105 (!) 185/80  Pulse: 71 74 83   Resp: 18  16   Temp: 98.7 F (37.1 C)  98.3 F (36.8 C)   TempSrc: Oral  Oral   SpO2: 99%  99%   Weight:      Height:        Intake/Output Summary (Last 24 hours) at 05/12/2020 1117 Last data filed at 05/11/2020 1946 Gross per 24 hour  Intake 291.64 ml  Output 10 ml  Net 281.64 ml   Filed Weights   05/08/20 1001 05/08/20 1813  Weight: 117.9 kg 111.8 kg    General appearance: Awake alert.  In no  distress Resp: Clear to auscultation bilaterally.  Normal effort Cardio: S1-S2 is normal regular.  No S3-S4.  No rubs murmurs or bruit GI: Abdomen is soft.  Continues to have tenderness in the epigastric as well as right upper quadrant areas.  No rebound rigidity or guarding.  Bowel sounds sluggish but present. Extremities: No edema.  Full range  of motion of lower extremities. Neurologic: Alert and oriented x3.  No focal neurological deficits.     Lab Results:  Data Reviewed: I have personally reviewed following labs and imaging studies  CBC: Recent Labs  Lab 05/08/20 1024 05/09/20 0505 05/10/20 0558 05/12/20 0537  WBC 7.1 9.6 7.3 6.4  NEUTROABS 4.3  --   --   --   HGB 14.4 14.8 15.8* 15.1*  HCT 41.8 44.1 45.0 43.6  MCV 92.3 94.4 90.7 91.2  PLT 326 336 172 132    Basic Metabolic Panel: Recent Labs  Lab 05/08/20 1024 05/08/20 1406 05/09/20 0505 05/10/20 0558 05/11/20 0514 05/12/20 0537  NA 142  --  137 138 137 137  K 3.3*  --  3.6 4.0 3.0* 3.6  CL 108  --  104 104 103 100  CO2 26  --  21* 23 23 24   GLUCOSE 96  --  129* 95 97 168*  BUN 12  --  12 11 11 13   CREATININE 0.79  --  0.73 0.90 0.81 0.92  CALCIUM 8.6*  --  9.2 9.3 9.3 9.6  MG  --  2.0  --   --   --  1.9    GFR: Estimated Creatinine Clearance: 122 mL/min (by C-G formula based on SCr of 0.92 mg/dL).  Liver Function Tests: Recent Labs  Lab 05/08/20 1024 05/09/20 0505 05/10/20 0558 05/11/20 0514 05/12/20 0537  AST 22 23 33 21 60*  ALT 29 28 25 26  59*  ALKPHOS 55 58 56 50 54  BILITOT 0.7 1.1 1.5* 1.2 0.5  PROT 6.7 7.4 7.1 6.7 7.6  ALBUMIN 4.2 4.3 4.3 4.0 4.4    Recent Labs  Lab 05/08/20 1024  LIPASE 26     Recent Results (from the past 240 hour(s))  Resp Panel by RT-PCR (Flu A&B, Covid) Nasopharyngeal Swab     Status: None   Collection Time: 05/08/20  4:19 PM   Specimen: Nasopharyngeal Swab; Nasopharyngeal(NP) swabs in vial transport medium  Result Value Ref Range Status   SARS  Coronavirus 2 by RT PCR NEGATIVE NEGATIVE Final    Comment: (NOTE) SARS-CoV-2 target nucleic acids are NOT DETECTED.  The SARS-CoV-2 RNA is generally detectable in upper respiratory specimens during the acute phase of infection. The lowest concentration of SARS-CoV-2 viral copies this assay can detect is 138 copies/mL. A negative result does not preclude SARS-Cov-2 infection and should not be used as the sole basis for treatment or other patient management decisions. A negative result may occur with  improper specimen collection/handling, submission of specimen other than nasopharyngeal swab, presence of viral mutation(s) within the areas targeted by this assay, and inadequate number of viral copies(<138 copies/mL). A negative result must be combined with clinical observations, patient history, and epidemiological information. The expected result is Negative.  Fact Sheet for Patients:  EntrepreneurPulse.com.au  Fact Sheet for Healthcare Providers:  IncredibleEmployment.be  This test is no t yet approved or cleared by the [place] FDA and  has been authorized for detection and/or diagnosis of SARS-CoV-2 by FDA under an Emergency Use Authorization (EUA). This EUA will remain  in effect (meaning this test can be used) for the duration of the COVID-19 declaration under Section 564(b)(1) of the Act, 21 U.S.C.section 360bbb-3(b)(1), unless the authorization is terminated  or revoked sooner.       Influenza A by PCR NEGATIVE NEGATIVE Final   Influenza B by PCR NEGATIVE NEGATIVE Final    Comment: (NOTE) The Xpert Xpress SARS-CoV-2/FLU/RSV  plus assay is intended as an aid in the diagnosis of influenza from Nasopharyngeal swab specimens and should not be used as a sole basis for treatment. Nasal washings and aspirates are unacceptable for Xpert Xpress SARS-CoV-2/FLU/RSV testing.  Fact Sheet for  Patients: EntrepreneurPulse.com.au  Fact Sheet for Healthcare Providers: IncredibleEmployment.be  This test is not yet approved or cleared by the [place] FDA and has been authorized for detection and/or diagnosis of SARS-CoV-2 by FDA under an Emergency Use Authorization (EUA). This EUA will remain in effect (meaning this test can be used) for the duration of the COVID-19 declaration under Section 564(b)(1) of the Act, 21 U.S.C. section 360bbb-3(b)(1), unless the authorization is terminated or revoked.  Performed at [facility], [place] [address], [place], [place] [address]       Radiology Studies: NM Hepato W/EF  Result Date: 05/11/2020 CLINICAL DATA:  Intermittent nausea, vomiting RIGHT upper quadrant pain for 2 years, worsening symptoms for 1 week EXAM: NUCLEAR MEDICINE HEPATOBILIARY IMAGING WITH GALLBLADDER EF TECHNIQUE: Sequential images of the abdomen were obtained out to 60 minutes following intravenous administration of radiopharmaceutical. After oral ingestion of Ensure, gallbladder ejection fraction was determined. At 60 min, normal ejection fraction is greater than 33%. RADIOPHARMACEUTICALS:  5.2 mCi Tc-78m  Choletec IV COMPARISON:  None. FINDINGS: Normal tracer extraction from bloodstream indicating normal hepatocellular function. Normal excretion of tracer into biliary tree. Gallbladder visualized at 73 min. Small bowel visualized at 14 min. No hepatic retention of tracer. Extensive patient motion on post Ensure imaging. Subjectively decreased emptying of tracer from gallbladder following fatty meal stimulation. Calculated gallbladder ejection fraction is 27%, suspect over estimating actual gallbladder emptying due to patient motion, subjectively ejection fraction appears less. Patient reported abdominal pain following Ensure ingestion. Normal gallbladder ejection fraction following Ensure ingestion is greater than 33% at 1  hour. IMPRESSION: Patent biliary tree. Abnormal gallbladder response to fatty meal stimulation with a decreased gallbladder emptying following Ensure, as above. Electronically Signed   By: Lavonia Dana M.D.   On: 05/11/2020 11:44       LOS: 4 days   [facility] Pager on [email or web address]  05/12/2020, 11:17 AM

## 2020-05-12 NOTE — Progress Notes (Signed)
Dr. Donne Hazel updated via phone regarding Pt with unrelieved abd pain after IV Medications given 1440 pm. Pt states " I really hurt right at my belly button and that Medicine didn't help". New orders placed. Pt reassurance given along with new Medications. Maintain current plan of care

## 2020-05-12 NOTE — Progress Notes (Signed)
Pt ambulated length of hall way.

## 2020-05-13 ENCOUNTER — Encounter (HOSPITAL_COMMUNITY): Payer: Self-pay | Admitting: Surgery

## 2020-05-13 LAB — CBC
HCT: 37.8 % (ref 36.0–46.0)
Hemoglobin: 13.4 g/dL (ref 12.0–15.0)
MCH: 32.1 pg (ref 26.0–34.0)
MCHC: 35.4 g/dL (ref 30.0–36.0)
MCV: 90.4 fL (ref 80.0–100.0)
Platelets: 282 10*3/uL (ref 150–400)
RBC: 4.18 MIL/uL (ref 3.87–5.11)
RDW: 12.5 % (ref 11.5–15.5)
WBC: 10.4 10*3/uL (ref 4.0–10.5)
nRBC: 0.3 % — ABNORMAL HIGH (ref 0.0–0.2)

## 2020-05-13 LAB — COMPREHENSIVE METABOLIC PANEL
ALT: 57 U/L — ABNORMAL HIGH (ref 0–44)
AST: 48 U/L — ABNORMAL HIGH (ref 15–41)
Albumin: 4 g/dL (ref 3.5–5.0)
Alkaline Phosphatase: 45 U/L (ref 38–126)
Anion gap: 13 (ref 5–15)
BUN: 13 mg/dL (ref 6–20)
CO2: 24 mmol/L (ref 22–32)
Calcium: 9.6 mg/dL (ref 8.9–10.3)
Chloride: 103 mmol/L (ref 98–111)
Creatinine, Ser: 0.94 mg/dL (ref 0.44–1.00)
GFR, Estimated: >60 mL/min (ref >60)
Glucose, Bld: 103 mg/dL — ABNORMAL HIGH (ref 70–99)
Potassium: 4.1 mmol/L (ref 3.5–5.1)
Sodium: 140 mmol/L (ref 135–145)
Total Bilirubin: 0.9 mg/dL (ref 0.3–1.2)
Total Protein: 6.9 g/dL (ref 6.5–8.1)

## 2020-05-13 LAB — LIPASE, BLOOD: Lipase: 38 U/L (ref 11–51)

## 2020-05-13 MED ORDER — POLYETHYLENE GLYCOL 3350 17 G PO PACK
17.0000 g | PACK | Freq: Every day | ORAL | Status: DC
  Administered 2020-05-13 – 2020-05-14 (×2): 17 g via ORAL
  Filled 2020-05-13 (×2): qty 1

## 2020-05-13 MED ORDER — PANTOPRAZOLE SODIUM 40 MG PO TBEC
40.0000 mg | DELAYED_RELEASE_TABLET | Freq: Two times a day (BID) | ORAL | Status: DC
  Administered 2020-05-13 – 2020-05-14 (×2): 40 mg via ORAL
  Filled 2020-05-13 (×2): qty 1
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
## 2020-05-13 NOTE — Evaluation (Signed)
Occupational Therapy Evaluation Patient Details Name: Krystal [name] MRN: [id number] DOB: [birth year] Today's Date: 05/13/2020    History of Present Illness Krystal [name] is a 30 y.o. female who is recently post-partum from her second child.  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 Pt s/p Core Liver Biopsy & SINGLE SITE Laparoscopic cholecystectomy on 05/11/20.   Clinical Impression   Patient evaluated by Occupational Therapy with no further acute OT needs identified. All education has been completed and the patient has no further questions.  See below for any follow-up Occupational Therapy or equipment needs. OT is signing off. Thank you for this referral.     Follow Up Recommendations  Follow surgeon's recommendation for DC plan and follow-up therapies    Equipment Recommendations   (none)    Recommendations for Other Services  (NA)     Precautions / Restrictions Precautions Precautions: None Restrictions Weight Bearing Restrictions: No      Mobility Bed Mobility               General bed mobility comments: Pt out of bed on window sill seat.    Transfers Overall transfer level: Independent               General transfer comment: Pt independently ambulating in room. Able to demonstrate toilet transfer and simulated tub t/f independently.    Balance Overall balance assessment: Independent                                         ADL either performed or assessed with clinical judgement   ADL Overall ADL's : At baseline                                       General ADL Comments: Pt is disrupted by pain and is concerned about performance of IADLs at home, although does have a housekeeper 1x/week for heavier tasks. Pt concerned about lifting restrictions and deferred to surgeon to ensure safety with child care and lifting after discharge.  Pt able to demoonstrate  BADLs at baseline except for post-op pain.     Vision Patient Visual Report: No change from baseline       Perception     Praxis      Pertinent Vitals/Pain Pain Assessment: 0-10 Pain Score: 6  Pain Location: Surgery site. Pain Intervention(s): Limited activity within patient's tolerance;Monitored during session;Patient requesting pain meds-RN notified     Hand Dominance Right (h/o LT above elbow amputation.)   Extremity/Trunk Assessment Upper Extremity Assessment Upper Extremity Assessment: LUE deficits/detail;Overall [facility] for tasks assessed (NT due to recent abdominal surgery. RT Grip WFL and RUE appears WFL.) LUE Deficits / Details: Previous LT above elbow amputation.   Lower Extremity Assessment Lower Extremity Assessment: Overall WFL for tasks assessed   Cervical / Trunk Assessment Cervical / Trunk Assessment: Normal   Communication Communication Communication: Other (comment)   Cognition Arousal/Alertness: Awake/alert Behavior During Therapy: WFL for tasks assessed/performed Overall Cognitive Status: Within Functional Limits for tasks assessed                                     General Comments       Exercises  Shoulder Instructions      Home Living Family/patient expects to be discharged to:: Private residence Living Arrangements: Spouse/significant other;Children Available Help at Discharge: Family;Available PRN/intermittently (Pt's father in law is watching pt's children while pt is hospitalized and provide some PRN assistance. Pt's husband works out of the house frequently.) Type of Home: House Home Access: Stairs to enter Technical brewer of Steps: 4 Entrance Stairs-Rails: None Home Layout: One level     Bathroom Shower/Tub: Teacher, early years/pre: Handicapped height     Home Equipment: Financial controller: Long-handled sponge        Prior Functioning/Environment Level of Independence:  Independent                 OT Problem List: Pain      OT Treatment/Interventions:      OT Goals(Current goals can be found in the care plan section) Acute Rehab OT Goals Patient Stated Goal: Be able to take care of kids. OT Goal Formulation: With patient Potential to Achieve Goals: Good  OT Frequency:     Barriers to D/C:            Co-evaluation              AM-PAC OT "6 Clicks" Daily Activity     Outcome Measure Help from another person eating meals?: None Help from another person taking care of personal grooming?: None Help from another person toileting, which includes using toliet, bedpan, or urinal?: None Help from another person bathing (including washing, rinsing, drying)?: None Help from another person to put on and taking off regular upper body clothing?: None Help from another person to put on and taking off regular lower body clothing?: None 6 Click Score: 24   End of Session Equipment Utilized During Treatment:  (none)  Activity Tolerance: Patient tolerated treatment well Patient left: in chair;with call bell/phone within reach;with nursing/sitter in room  OT Visit Diagnosis: Pain Pain - part of body:  (Abdomen)                Time: 6834-1962 OT Time Calculation (min): 13 min Charges:  OT General Charges $OT Visit: 1 Visit OT Evaluation $OT Eval Low Complexity: 1 Low  Delories Mauri, OT Acute Rehab Services Office: [phone] 05/13/2020   Julien Girt 05/13/2020, 8:58 AM

## 2020-05-13 NOTE — Progress Notes (Signed)
[facility] PROGRESS NOTE   Haya Hemler Ferrando HDQ:[id number] DOB: 03/02/1990 DOA: 05/08/2020  PCP: Maurice Small, MD  Brief History/Interval Summary: 30 year old female with past medical history of obesity, migraines, kidney stones, hypertension, interstitial cystitis, asthma, appendectomy, necrotizing fasciitis s/p left hand amputation, HELLP syndrome who presented to the ED with RUQ abdominal pain.  She has been dealing with intermittent RUQ abdominal pain since her pregnancy but had improved after giving birth 7 months ago. Since then it has been intermittent however over the past week it has acutely worsened.   Consultants:  [facility] General [facility]  Procedures:  EGD 3/17 Impression:               - Normal esophagus.                           - Gastritis. Biopsied.                           - Normal duodenal bulb, first portion of the                            duodenum and second portion of the duodenum.                           - No explanation for patient's abdominal pain seen   Laparoscopic cholecystectomy on 3/18  Antibiotics: Anti-infectives (From admission, onward)   Start     Dose/Rate Route Frequency Ordered Stop   05/12/20 1800  cefTRIAXone (ROCEPHIN) 2 g in sodium chloride 0.9 % 100 mL IVPB  Status:  Discontinued       Note to Pharmacy: Pharmacy may adjust dosing strength / duration / interval for maximal efficacy   2 g 200 mL/hr over 30 Minutes Intravenous Every 24 hours 05/11/20 1837 05/12/20 1340   05/11/20 1845  cefTRIAXone (ROCEPHIN) 2 g in sodium chloride 0.9 % 100 mL IVPB       Note to Pharmacy: Pharmacy may adjust dosing strength, interval, or rate of medication as needed for optimal therapy for the patient Send with patient on call to the OR.  Anesthesia to complete antibiotic administration <36min prior to incision per [facility].   2 g 200 mL/hr over 30 Minutes Intravenous On call to O.R. 05/11/20 1837 05/11/20 1854   05/11/20  1837  sodium chloride 0.9 % with cefTRIAXone (ROCEPHIN) ADS Med       Note to Pharmacy: Nash Dimmer   : cabinet override      05/11/20 1837 05/11/20 1903      Subjective/Interval History: Patient had significant pain issues yesterday.  She was given Carafate with significant improvement.  Continues to have discomfort on and off.  However feels better today compared to yesterday.  Has been able to tolerate her diet as well.  No new complaints offered.   Assessment/Plan:  Intractable nausea and vomiting with right upper quadrant abdominal pain/biliary dyskinesis CTA of chest abdomen pelvis was unremarkable.  Ultrasound showed suspected fatty liver, possible steatohepatitis. GB was noted to be unremarkable.  Patient with significant tenderness in the right upper quadrant.  LFTs are surprisingly normal.  Lipase level was normal.   Patient seen by gastroenterology.  Patient underwent EGD which did not show any findings that would explain her abdominal symptoms.  Gastritis was noted.  Biopsies were  taken.  Pathology shows mild gastropathy.  No H. Pylori. General surgery was subsequently consulted.  HIDA scan was done.  Patient underwent laparoscopic cholecystectomy.  Symptoms have been better over the last 12 hours.  She did have a bad day yesterday which he attributes to eating a baked potato.  Mobilize.  Continue current pain medication regimen.  Carafate was added yesterday which seems to be helping.  Discussed with Dr. Donne Hazel.  Anticipate discharge tomorrow.  Mildly abnormal LFTs are noted, likely due to recent surgery.  Hemoglobin is stable.  Lipase is normal.  Accelerated hypertension Elevated blood pressure most likely due to significant pain issues as well as her baseline history.   Due to poorly controlled blood pressure dose of hydralazine and labetalol was increased yesterday.  Amlodipine being continued.  Pain is contributing to high readings.     History of  asthma Stable.  Hypokalemia Potassium is better.  Magnesium 1.9.  Lactating Patient delivered her child 7 months ago.  Continues to breast-feed.  History of depression and anxiety Continue Zoloft.  Obesity Estimated body mass index is 34.38 kg/m as calculated from the following:   Height as of this encounter: 5\' 11"  (1.803 m).   Weight as of this encounter: 111.8 kg.   DVT Prophylaxis: Lovenox Code Status: Full code Family Communication: Discussed with the patient Disposition Plan: Hopefully return home when improved  Status is: Inpatient  Remains inpatient appropriate because:Ongoing active pain requiring inpatient pain management and IV treatments appropriate due to intensity of illness or inability to take PO   Dispo: The patient is from: Home              Anticipated d/c is to: Home              Patient currently is not medically stable to d/c.   Difficult to place patient No     Medications:  Scheduled: . acetaminophen  1,000 mg Oral Q6H  . amLODipine  10 mg Oral Daily  . enoxaparin (LOVENOX) injection  55 mg Subcutaneous Q24H  . hydrALAZINE  100 mg Oral TID  . labetalol  300 mg Oral BID  . levothyroxine  112 mcg Oral Q0600  . lip balm  1 application Topical BID  . pantoprazole (PROTONIX) IV  40 mg Intravenous Q12H  . polycarbophil  625 mg Oral BID  . polyethylene glycol  17 g Oral Daily  . senna-docusate  2 tablet Oral BID  . sertraline  150 mg Oral Daily  . sodium chloride flush  3 mL Intravenous Q12H  . sodium chloride flush  3 mL Intravenous Q12H  . sucralfate  1 g Oral TID WC & HS   Continuous: . sodium chloride    . lactated ringers     OIZ:TIWPYK chloride, ALPRAZolam, alum & mag hydroxide-simeth, bisacodyl, diphenhydrAMINE, enalaprilat, hydrALAZINE, HYDROmorphone (DILAUDID) injection, ipratropium-albuterol, ketorolac, lactated ringers, magic mouthwash, ondansetron (ZOFRAN) IV, oxyCODONE, prochlorperazine, sodium chloride  flush   Objective:  Vital Signs  Vitals:   05/12/20 1900 05/12/20 2217 05/13/20 0404 05/13/20 0901  BP: (!) 187/107 (!) 161/96 (!) 141/86 (!) 197/115  Pulse: (!) 117 94 71 74  Resp:  18 16   Temp:  98.5 F (36.9 C) 98.4 F (36.9 C)   TempSrc:  Oral Oral   SpO2:  98% 96% 94%  Weight:      Height:        Intake/Output Summary (Last 24 hours) at 05/13/2020 1117 Last data filed at 05/13/2020 1108 Gross per  24 hour  Intake 480 ml  Output --  Net 480 ml   Filed Weights   05/08/20 1001 05/08/20 1813  Weight: 117.9 kg 111.8 kg    General appearance: Awake alert.  In no distress Resp: Clear to auscultation bilaterally.  Normal effort Cardio: S1-S2 is normal regular.  No S3-S4.  No rubs murmurs or bruit   Lab Results:  Data Reviewed: I have personally reviewed following labs and imaging studies  CBC: Recent Labs  Lab 05/08/20 1024 05/09/20 0505 05/10/20 0558 05/12/20 0537 05/13/20 0606  WBC 7.1 9.6 7.3 6.4 10.4  NEUTROABS 4.3  --   --   --   --   HGB 14.4 14.8 15.8* 15.1* 13.4  HCT 41.8 44.1 45.0 43.6 37.8  MCV 92.3 94.4 90.7 91.2 90.4  PLT 326 336 172 353 825    Basic Metabolic Panel: Recent Labs  Lab 05/08/20 1406 05/09/20 0505 05/10/20 0558 05/11/20 0514 05/12/20 0537 05/13/20 0606  NA  --  137 138 137 137 140  K  --  3.6 4.0 3.0* 3.6 4.1  CL  --  104 104 103 100 103  CO2  --  21* 23 23 24 24   GLUCOSE  --  129* 95 97 168* 103*  BUN  --  12 11 11 13 13   CREATININE  --  0.73 0.90 0.81 0.92 0.94  CALCIUM  --  9.2 9.3 9.3 9.6 9.6  MG 2.0  --   --   --  1.9  --     GFR: Estimated Creatinine Clearance: 119.4 mL/min (by C-G formula based on SCr of 0.94 mg/dL).  Liver Function Tests: Recent Labs  Lab 05/09/20 0505 05/10/20 0558 05/11/20 0514 05/12/20 0537 05/13/20 0606  AST 23 33 21 60* 48*  ALT 28 25 26  59* 57*  ALKPHOS 58 56 50 54 45  BILITOT 1.1 1.5* 1.2 0.5 0.9  PROT 7.4 7.1 6.7 7.6 6.9  ALBUMIN 4.3 4.3 4.0 4.4 4.0    Recent Labs   Lab 05/08/20 1024 05/13/20 0814  LIPASE 26 38     Recent Results (from the past 240 hour(s))  Resp Panel by RT-PCR (Flu A&B, Covid) Nasopharyngeal Swab     Status: None   Collection Time: 05/08/20  4:19 PM   Specimen: Nasopharyngeal Swab; Nasopharyngeal(NP) swabs in vial transport medium  Result Value Ref Range Status   SARS Coronavirus 2 by RT PCR NEGATIVE NEGATIVE Final    Comment: (NOTE) SARS-CoV-2 target nucleic acids are NOT DETECTED.  The SARS-CoV-2 RNA is generally detectable in upper respiratory specimens during the acute phase of infection. The lowest concentration of SARS-CoV-2 viral copies this assay can detect is 138 copies/mL. A negative result does not preclude SARS-Cov-2 infection and should not be used as the sole basis for treatment or other patient management decisions. A negative result may occur with  improper specimen collection/handling, submission of specimen other than nasopharyngeal swab, presence of viral mutation(s) within the areas targeted by this assay, and inadequate number of viral copies(<138 copies/mL). A negative result must be combined with clinical observations, patient history, and epidemiological information. The expected result is Negative.  Fact Sheet for Patients:  EntrepreneurPulse.com.au  Fact Sheet for Healthcare Providers:  IncredibleEmployment.be  This test is no t yet approved or cleared by the [place] FDA and  has been authorized for detection and/or diagnosis of SARS-CoV-2 by FDA under an Emergency Use Authorization (EUA). This EUA will remain  in effect (meaning this test can  be used) for the duration of the COVID-19 declaration under Section 564(b)(1) of the Act, 21 U.S.C.section 360bbb-3(b)(1), unless the authorization is terminated  or revoked sooner.       Influenza A by PCR NEGATIVE NEGATIVE Final   Influenza B by PCR NEGATIVE NEGATIVE Final    Comment: (NOTE) The Xpert  Xpress SARS-CoV-2/FLU/RSV plus assay is intended as an aid in the diagnosis of influenza from Nasopharyngeal swab specimens and should not be used as a sole basis for treatment. Nasal washings and aspirates are unacceptable for Xpert Xpress SARS-CoV-2/FLU/RSV testing.  Fact Sheet for Patients: EntrepreneurPulse.com.au  Fact Sheet for Healthcare Providers: IncredibleEmployment.be  This test is not yet approved or cleared by the [place] FDA and has been authorized for detection and/or diagnosis of SARS-CoV-2 by FDA under an Emergency Use Authorization (EUA). This EUA will remain in effect (meaning this test can be used) for the duration of the COVID-19 declaration under Section 564(b)(1) of the Act, 21 U.S.C. section 360bbb-3(b)(1), unless the authorization is terminated or revoked.  Performed at [facility], [place] [address] [place], [place] [address]       Radiology Studies: NM Hepato W/EF  Result Date: 05/11/2020 CLINICAL DATA:  Intermittent nausea, vomiting RIGHT upper quadrant pain for 2 years, worsening symptoms for 1 week EXAM: NUCLEAR MEDICINE HEPATOBILIARY IMAGING WITH GALLBLADDER EF TECHNIQUE: Sequential images of the abdomen were obtained out to 60 minutes following intravenous administration of radiopharmaceutical. After oral ingestion of Ensure, gallbladder ejection fraction was determined. At 60 min, normal ejection fraction is greater than 33%. RADIOPHARMACEUTICALS:  5.2 mCi Tc-69m  Choletec IV COMPARISON:  None. FINDINGS: Normal tracer extraction from bloodstream indicating normal hepatocellular function. Normal excretion of tracer into biliary tree. Gallbladder visualized at 73 min. Small bowel visualized at 14 min. No hepatic retention of tracer. Extensive patient motion on post Ensure imaging. Subjectively decreased emptying of tracer from gallbladder following fatty meal stimulation. Calculated gallbladder  ejection fraction is 27%, suspect over estimating actual gallbladder emptying due to patient motion, subjectively ejection fraction appears less. Patient reported abdominal pain following Ensure ingestion. Normal gallbladder ejection fraction following Ensure ingestion is greater than 33% at 1 hour. IMPRESSION: Patent biliary tree. Abnormal gallbladder response to fatty meal stimulation with a decreased gallbladder emptying following Ensure, as above. Electronically Signed   By: Lavonia Dana M.D.   On: 05/11/2020 11:44   DG Abd Portable 2V  Result Date: 05/12/2020 CLINICAL DATA:  Increasing abdominal pain EXAM: PORTABLE ABDOMEN - 2 VIEW COMPARISON:  CT 05/08/2020 FINDINGS: Prior cholecystectomy. Gas within nondistended large and small bowel. No organomegaly, free air or suspicious calcification. IMPRESSION: No acute findings. Electronically Signed   By: Rolm Baptise M.D.   On: 05/12/2020 16:41       LOS: 5 days   [facility] Pager on [email or web address]  05/13/2020, 11:17 AM

## 2020-05-13 NOTE — Anesthesia Postprocedure Evaluation (Signed)
Anesthesia Post Note  Patient: Krystal [name]  Procedure(s) Performed: SINGLE SITE LAPAROSCOPIC CHOLECYSTECTOMY AND LIVER BIOSY (N/A )     Patient location during evaluation: PACU Anesthesia Type: General Level of consciousness: awake and alert Pain management: pain level controlled Vital Signs Assessment: post-procedure vital signs reviewed and stable Respiratory status: spontaneous breathing, nonlabored ventilation, respiratory function stable and patient connected to nasal cannula oxygen Cardiovascular status: blood pressure returned to baseline and stable Postop Assessment: no apparent nausea or vomiting Anesthetic complications: no   No complications documented.  Last Vitals:  Vitals:   05/12/20 2217 05/13/20 0404  BP: (!) 161/96 (!) 141/86  Pulse: 94 71  Resp: 18 16  Temp: 36.9 C 36.9 C  SpO2: 98% 96%    Last Pain:  Vitals:   05/13/20 0632  TempSrc:   PainSc: 8                  HODIERNE,ADAM S

## 2020-05-13 NOTE — Progress Notes (Signed)
2 Days Post-Op   Subjective/Chief Complaint: Better today, pain with eating yesterday but different, oob, no bm having flatus   Objective: Vital signs in last 24 hours: Temp:  [98.1 F (36.7 C)-98.6 F (37 C)] 98.4 F (36.9 C) (03/20 0404) Pulse Rate:  [71-117] 71 (03/20 0404) Resp:  [14-18] 16 (03/20 0404) BP: (141-187)/(80-107) 141/86 (03/20 0404) SpO2:  [96 %-99 %] 96 % (03/20 0404)    Intake/Output from previous day: 03/19 0701 - 03/20 0700 In: 240 [P.O.:240] Out: -  Intake/Output this shift: No intake/output data recorded.  GI: soft approp tender dressings dry  Lab Results:  Recent Labs    05/12/20 0537 05/13/20 0606  WBC 6.4 10.4  HGB 15.1* 13.4  HCT 43.6 37.8  PLT 353 282   BMET Recent Labs    05/12/20 0537 05/13/20 0606  NA 137 140  K 3.6 4.1  CL 100 103  CO2 24 24  GLUCOSE 168* 103*  BUN 13 13  CREATININE 0.92 0.94  CALCIUM 9.6 9.6   PT/INR No results for input(s): LABPROT, INR in the last 72 hours. ABG No results for input(s): PHART, HCO3 in the last 72 hours.  Invalid input(s): PCO2, PO2  Studies/Results: NM Hepato W/EF  Result Date: 05/11/2020 CLINICAL DATA:  Intermittent nausea, vomiting RIGHT upper quadrant pain for 2 years, worsening symptoms for 1 week EXAM: NUCLEAR MEDICINE HEPATOBILIARY IMAGING WITH GALLBLADDER EF TECHNIQUE: Sequential images of the abdomen were obtained out to 60 minutes following intravenous administration of radiopharmaceutical. After oral ingestion of Ensure, gallbladder ejection fraction was determined. At 60 min, normal ejection fraction is greater than 33%. RADIOPHARMACEUTICALS:  5.2 mCi Tc-10m  Choletec IV COMPARISON:  None. FINDINGS: Normal tracer extraction from bloodstream indicating normal hepatocellular function. Normal excretion of tracer into biliary tree. Gallbladder visualized at 73 min. Small bowel visualized at 14 min. No hepatic retention of tracer. Extensive patient motion on post Ensure imaging.  Subjectively decreased emptying of tracer from gallbladder following fatty meal stimulation. Calculated gallbladder ejection fraction is 27%, suspect over estimating actual gallbladder emptying due to patient motion, subjectively ejection fraction appears less. Patient reported abdominal pain following Ensure ingestion. Normal gallbladder ejection fraction following Ensure ingestion is greater than 33% at 1 hour. IMPRESSION: Patent biliary tree. Abnormal gallbladder response to fatty meal stimulation with a decreased gallbladder emptying following Ensure, as above. Electronically Signed   By: Lavonia Dana M.D.   On: 05/11/2020 11:44   DG Abd Portable 2V  Result Date: 05/12/2020 CLINICAL DATA:  Increasing abdominal pain EXAM: PORTABLE ABDOMEN - 2 VIEW COMPARISON:  CT 05/08/2020 FINDINGS: Prior cholecystectomy. Gas within nondistended large and small bowel. No organomegaly, free air or suspicious calcification. IMPRESSION: No acute findings. Electronically Signed   By: Rolm Baptise M.D.   On: 05/12/2020 16:41    Anti-infectives: Anti-infectives (From admission, onward)   Start     Dose/Rate Route Frequency Ordered Stop   05/12/20 1800  cefTRIAXone (ROCEPHIN) 2 g in sodium chloride 0.9 % 100 mL IVPB  Status:  Discontinued       Note to Pharmacy: Pharmacy may adjust dosing strength / duration / interval for maximal efficacy   2 g 200 mL/hr over 30 Minutes Intravenous Every 24 hours 05/11/20 1837 05/12/20 1340   05/11/20 1845  cefTRIAXone (ROCEPHIN) 2 g in sodium chloride 0.9 % 100 mL IVPB       Note to Pharmacy: Pharmacy may adjust dosing strength, interval, or rate of medication as needed for optimal therapy for  the patient Send with patient on call to the OR.  Anesthesia to complete antibiotic administration <14min prior to incision per [facility].   2 g 200 mL/hr over 30 Minutes Intravenous On call to O.R. 05/11/20 1837 05/11/20 1854   05/11/20 1837  sodium chloride 0.9 % with cefTRIAXone  (ROCEPHIN) ADS Med       Note to Pharmacy: Nash Dimmer   : cabinet override      05/11/20 1837 05/11/20 1903      Assessment/Plan: POD 2 lap chole-dyskinesia- Gross -regular diet -bowel regimen, added miralax -labs normal, no concerns for surgical complication- should just need oral pain meds as written now -can dc home when medicine ready to [facility] 05/13/2020

## 2020-05-14 DIAGNOSIS — K810 Acute cholecystitis: Secondary | ICD-10-CM

## 2020-05-14 LAB — COMPREHENSIVE METABOLIC PANEL
ALT: 49 U/L — ABNORMAL HIGH (ref 0–44)
AST: 28 U/L (ref 15–41)
Albumin: 4 g/dL (ref 3.5–5.0)
Alkaline Phosphatase: 55 U/L (ref 38–126)
Anion gap: 11 (ref 5–15)
BUN: 15 mg/dL (ref 6–20)
CO2: 28 mmol/L (ref 22–32)
Calcium: 9.5 mg/dL (ref 8.9–10.3)
Chloride: 98 mmol/L (ref 98–111)
Creatinine, Ser: 0.94 mg/dL (ref 0.44–1.00)
GFR, Estimated: >60 mL/min (ref >60)
Glucose, Bld: 122 mg/dL — ABNORMAL HIGH (ref 70–99)
Potassium: 2.9 mmol/L — ABNORMAL LOW (ref 3.5–5.1)
Sodium: 137 mmol/L (ref 135–145)
Total Bilirubin: 0.5 mg/dL (ref 0.3–1.2)
Total Protein: 6.6 g/dL (ref 6.5–8.1)

## 2020-05-14 LAB — CBC
HCT: 36.8 % (ref 36.0–46.0)
Hemoglobin: 12.9 g/dL (ref 12.0–15.0)
MCH: 31.9 pg (ref 26.0–34.0)
MCHC: 35.1 g/dL (ref 30.0–36.0)
MCV: 90.9 fL (ref 80.0–100.0)
Platelets: 274 10*3/uL (ref 150–400)
RBC: 4.05 MIL/uL (ref 3.87–5.11)
RDW: 12.3 % (ref 11.5–15.5)
WBC: 8.4 10*3/uL (ref 4.0–10.5)
nRBC: 0 % (ref 0.0–0.2)

## 2020-05-14 LAB — MAGNESIUM: Magnesium: 1.9 mg/dL (ref 1.7–2.4)

## 2020-05-14 LAB — SURGICAL PATHOLOGY

## 2020-05-14 MED ORDER — PANTOPRAZOLE SODIUM 40 MG PO TBEC: DELAYED_RELEASE_TABLET | ORAL | 0 refills | Status: DC

## 2020-05-14 MED ORDER — OXYCODONE HCL 5 MG PO TABS
5.0000 mg | ORAL_TABLET | ORAL | 0 refills | Status: DC | PRN
Start: 2020-05-14 — End: 2020-08-24

## 2020-05-14 MED ORDER — HYDRALAZINE HCL 100 MG PO TABS: 100.0000 mg | ORAL_TABLET | Freq: Three times a day (TID) | ORAL | 1 refills | Status: DC

## 2020-05-14 MED ORDER — SENNOSIDES-DOCUSATE SODIUM 8.6-50 MG PO TABS: 2.0000 | ORAL_TABLET | Freq: Two times a day (BID) | ORAL | 0 refills | Status: DC

## 2020-05-14 MED ORDER — POLYETHYLENE GLYCOL 3350 17 GM/SCOOP PO POWD: 17.0000 g | Freq: Every day | ORAL | 0 refills | Status: DC

## 2020-05-14 MED ORDER — OXYCODONE HCL 5 MG PO TABS: 5.0000 mg | ORAL_TABLET | ORAL | 0 refills | Status: DC | PRN

## 2020-05-14 MED ORDER — SUCRALFATE 1 GM/10ML PO SUSP: 1.0000 g | Freq: Three times a day (TID) | ORAL | 0 refills | Status: DC

## 2020-05-14 MED ORDER — LABETALOL HCL 300 MG PO TABS: 300.0000 mg | ORAL_TABLET | Freq: Two times a day (BID) | ORAL | 2 refills | Status: DC

## 2020-05-14 MED ORDER — POTASSIUM CHLORIDE CRYS ER 20 MEQ PO TBCR
40.0000 meq | EXTENDED_RELEASE_TABLET | Freq: Every day | ORAL | 0 refills | Status: DC
Start: 2020-05-14 — End: 2020-08-24

## 2020-05-14 MED ORDER — ACETAMINOPHEN 500 MG PO TABS: 1000.0000 mg | ORAL_TABLET | Freq: Four times a day (QID) | ORAL | 0 refills | Status: DC | PRN

## 2020-05-14 MED ORDER — AMLODIPINE BESYLATE 10 MG PO TABS: 10.0000 mg | ORAL_TABLET | Freq: Every day | ORAL | 2 refills | Status: DC

## 2020-05-14 MED ORDER — POTASSIUM CHLORIDE CRYS ER 20 MEQ PO TBCR
40.0000 meq | EXTENDED_RELEASE_TABLET | ORAL | Status: DC
  Administered 2020-05-14: 40 meq via ORAL
  Filled 2020-05-14: qty 2

## 2020-05-14 MED ORDER — POLYETHYLENE GLYCOL 3350 17 G PO PACK: 17.0000 g | PACK | Freq: Every day | ORAL | 0 refills | Status: DC

## 2020-05-14 NOTE — Discharge Summary (Signed)
[facility]  Physician Discharge Summary   Patient ID: Krystal [name] MRN: [id number] DOB/AGE: 32/22/1992 32 y.o.  Admit date: 05/08/2020 Discharge date: 05/15/2020  PCP: Maurice Small, MD  DISCHARGE DIAGNOSES:  Chronic cholecystitis status post cholecystectomy Nausea and vomiting with abdominal pain, improved Accelerated hypertension History of asthma  RECOMMENDATIONS FOR OUTPATIENT FOLLOW UP: 1. Follow-up with general surgery 2. May need to be referred back to [facility] gastroenterology if her symptoms do not improve in a few weeks.    Home Health: None Equipment/Devices: None  CODE STATUS: Full code  DISCHARGE CONDITION: fair  Diet recommendation: As recommended by general surgery  INITIAL HISTORY: 30 year old female with past medical history of obesity, migraines, kidney stones, hypertension, interstitial cystitis, asthma, appendectomy,necrotizing fasciitis s/pleft hand amputation, HELLP syndromewho presented to the ED with RUQ abdominal pain. She has been dealing with intermittent RUQ abdominal pain since her pregnancy but had improved after giving birth 7 months ago. Since then it has been intermittent however over the past week it has acutely worsened.   Consultants:  [facility] General surgery  Procedures:  EGD 3/17 Impression: - Normal esophagus. - Gastritis. Biopsied. - Normal duodenal bulb, first portion of the  duodenum and second portion of the duodenum. - No explanation for patient's abdominal pain seen   Laparoscopic cholecystectomy on 3/18   HOSPITAL COURSE:   Chronic cholecystitis/nausea vomiting with right upper quadrant abdominal pain/biliary dyskinesis  Patient presented with right upper quadrant abdominal pain with nausea and vomiting.  CTA of chest abdomen pelvis was unremarkable.  Ultrasound showed  suspected fatty liver, possible steatohepatitis. GB was noted to be unremarkable.  Patient with significant tenderness in the right upper quadrant.  LFTs are surprisingly normal.  Lipase level was normal.   Patient seen by gastroenterology.  Patient underwent EGD which did not show any findings that would explain her abdominal symptoms.  Gastritis was noted.  Biopsies were taken.  Pathology shows mild gastropathy.  No H. Pylori. General surgery was subsequently consulted.  HIDA scan was done.  Patient underwent laparoscopic cholecystectomy.  Symptoms have been slow to improve.  Patient was started on Carafate which seems to be helping.  Discussed with general surgery.  Patient has been tolerating her diet with minimal nausea now.  Okay for discharge today.  Outpatient follow-up with general surgery.  Accelerated hypertension/hypokalemia Elevated blood pressure most likely due to abdominal pain.  Her antihypertensive dosage was adjusted.  History of asthma Stable.  Lactating Patient delivered her child 7 months ago.  Continues to breast-feed.  History of depression and anxiety Continue Zoloft.  Obesity Estimated body mass index is 34.38 kg/m as calculated from the following:   Height as of this encounter: 5\' 11"  (1.803 m).   Weight as of this encounter: 111.8 kg.   Patient feels better.  Has been ambulating without difficulty.  Okay for discharge today.   PERTINENT LABS:  The results of significant diagnostics from this hospitalization (including imaging, microbiology, ancillary and laboratory) are listed below for reference.    Microbiology: Recent Results (from the past 240 hour(s))  Resp Panel by RT-PCR (Flu A&B, Covid) Nasopharyngeal Swab     Status: None   Collection Time: 05/08/20  4:19 PM   Specimen: Nasopharyngeal Swab; Nasopharyngeal(NP) swabs in vial transport medium  Result Value Ref Range Status   SARS Coronavirus 2 by RT PCR NEGATIVE NEGATIVE Final    Comment:  (NOTE) SARS-CoV-2 target nucleic acids are NOT DETECTED.  The SARS-CoV-2 RNA is generally detectable in  upper respiratory specimens during the acute phase of infection. The lowest concentration of SARS-CoV-2 viral copies this assay can detect is 138 copies/mL. A negative result does not preclude SARS-Cov-2 infection and should not be used as the sole basis for treatment or other patient management decisions. A negative result may occur with  improper specimen collection/handling, submission of specimen other than nasopharyngeal swab, presence of viral mutation(s) within the areas targeted by this assay, and inadequate number of viral copies(<138 copies/mL). A negative result must be combined with clinical observations, patient history, and epidemiological information. The expected result is Negative.  Fact Sheet for Patients:  EntrepreneurPulse.com.au  Fact Sheet for Healthcare Providers:  IncredibleEmployment.be  This test is no t yet approved or cleared by the [place] FDA and  has been authorized for detection and/or diagnosis of SARS-CoV-2 by FDA under an Emergency Use Authorization (EUA). This EUA will remain  in effect (meaning this test can be used) for the duration of the COVID-19 declaration under Section 564(b)(1) of the Act, 21 U.S.C.section 360bbb-3(b)(1), unless the authorization is terminated  or revoked sooner.       Influenza A by PCR NEGATIVE NEGATIVE Final   Influenza B by PCR NEGATIVE NEGATIVE Final    Comment: (NOTE) The Xpert Xpress SARS-CoV-2/FLU/RSV plus assay is intended as an aid in the diagnosis of influenza from Nasopharyngeal swab specimens and should not be used as a sole basis for treatment. Nasal washings and aspirates are unacceptable for Xpert Xpress SARS-CoV-2/FLU/RSV testing.  Fact Sheet for Patients: EntrepreneurPulse.com.au  Fact Sheet for Healthcare  Providers: IncredibleEmployment.be  This test is not yet approved or cleared by the [place] FDA and has been authorized for detection and/or diagnosis of SARS-CoV-2 by FDA under an Emergency Use Authorization (EUA). This EUA will remain in effect (meaning this test can be used) for the duration of the COVID-19 declaration under Section 564(b)(1) of the Act, 21 U.S.C. section 360bbb-3(b)(1), unless the authorization is terminated or revoked.  Performed at [facility], [place] [address], [place], [place] [address]      Labs:  COVID-19 Labs   Lab Results  Component Value Date   [place] NEGATIVE 05/08/2020      Basic Metabolic Panel: Recent Labs  Lab 05/08/20 1406 05/09/20 0505 05/10/20 0558 05/11/20 0514 05/12/20 0537 05/13/20 0606 05/14/20 0515  NA  --    < > 138 137 137 140 137  K  --    < > 4.0 3.0* 3.6 4.1 2.9*  CL  --    < > 104 103 100 103 98  CO2  --    < > 23 23 24 24 28   GLUCOSE  --    < > 95 97 168* 103* 122*  BUN  --    < > 11 11 13 13 15   CREATININE  --    < > 0.90 0.81 0.92 0.94 0.94  CALCIUM  --    < > 9.3 9.3 9.6 9.6 9.5  MG 2.0  --   --   --  1.9  --  1.9   < > = values in this interval not displayed.   Liver Function Tests: Recent Labs  Lab 05/10/20 0558 05/11/20 0514 05/12/20 0537 05/13/20 0606 05/14/20 0515  AST 33 21 60* 48* 28  ALT 25 26 59* 57* 49*  ALKPHOS 56 50 54 45 55  BILITOT 1.5* 1.2 0.5 0.9 0.5  PROT 7.1 6.7 7.6 6.9 6.6  ALBUMIN 4.3 4.0 4.4  4.0 4.0   Recent Labs  Lab 05/13/20 0814  LIPASE 38   CBC: Recent Labs  Lab 05/09/20 0505 05/10/20 0558 05/12/20 0537 05/13/20 0606 05/14/20 0515  WBC 9.6 7.3 6.4 10.4 8.4  HGB 14.8 15.8* 15.1* 13.4 12.9  HCT 44.1 45.0 43.6 37.8 36.8  MCV 94.4 90.7 91.2 90.4 90.9  PLT 336 172 353 282 274   BNP: BNP (last 3 results) Recent Labs    12/12/19 0825  BNP 42.3    IMAGING STUDIES NM Hepato W/EF  Result Date:  05/11/2020 CLINICAL DATA:  Intermittent nausea, vomiting RIGHT upper quadrant pain for 2 years, worsening symptoms for 1 week EXAM: NUCLEAR MEDICINE HEPATOBILIARY IMAGING WITH GALLBLADDER EF TECHNIQUE: Sequential images of the abdomen were obtained out to 60 minutes following intravenous administration of radiopharmaceutical. After oral ingestion of Ensure, gallbladder ejection fraction was determined. At 60 min, normal ejection fraction is greater than 33%. RADIOPHARMACEUTICALS:  5.2 mCi Tc-1m  Choletec IV COMPARISON:  None. FINDINGS: Normal tracer extraction from bloodstream indicating normal hepatocellular function. Normal excretion of tracer into biliary tree. Gallbladder visualized at 73 min. Small bowel visualized at 14 min. No hepatic retention of tracer. Extensive patient motion on post Ensure imaging. Subjectively decreased emptying of tracer from gallbladder following fatty meal stimulation. Calculated gallbladder ejection fraction is 27%, suspect over estimating actual gallbladder emptying due to patient motion, subjectively ejection fraction appears less. Patient reported abdominal pain following Ensure ingestion. Normal gallbladder ejection fraction following Ensure ingestion is greater than 33% at 1 hour. IMPRESSION: Patent biliary tree. Abnormal gallbladder response to fatty meal stimulation with a decreased gallbladder emptying following Ensure, as above. Electronically Signed   By: [place] Dana M.D.   On: 05/11/2020 11:44   DG Abd Portable 2V  Result Date: 05/12/2020 CLINICAL DATA:  Increasing abdominal pain EXAM: PORTABLE ABDOMEN - 2 VIEW COMPARISON:  CT 05/08/2020 FINDINGS: Prior cholecystectomy. Gas within nondistended large and small bowel. No organomegaly, free air or suspicious calcification. IMPRESSION: No acute findings. Electronically Signed   By: Rolm Baptise M.D.   On: 05/12/2020 16:41   CT Angio Chest/Abd/Pel for Dissection W and/or Wo Contrast  Result Date: 05/08/2020 CLINICAL  DATA:  Abdominal pain, aortic dissection suspected. Right upper quadrant pain on going for weeks. EXAM: CT ANGIOGRAPHY CHEST, ABDOMEN AND PELVIS TECHNIQUE: Non-contrast CT of the chest was initially obtained. Multidetector CT imaging through the chest, abdomen and pelvis was performed using the standard protocol during bolus administration of intravenous contrast. Multiplanar reconstructed images and MIPs were obtained and reviewed to evaluate the vascular anatomy. CONTRAST:  161mL OMNIPAQUE IOHEXOL 350 MG/ML SOLN COMPARISON:  Right upper quadrant ultrasound May 08, 2020 and CT abdomen pelvis October 12, 2015. FINDINGS: CTA CHEST FINDINGS Cardiovascular: No evidence of intramural hematoma on precontrast imaging. Preferential opacification of the thoracic aorta. No evidence of thoracic aortic aneurysm or dissection. Normal heart size. No pericardial effusion. Mediastinum/Nodes: No enlarged mediastinal, hilar, or axillary lymph nodes. Thyroid gland, trachea, and esophagus demonstrate no significant findings. Lungs/Pleura: Lungs are clear. No pleural effusion or pneumothorax. Musculoskeletal: No chest wall abnormality. No acute or significant osseous findings. Review of the MIP images confirms the above findings. CTA ABDOMEN AND PELVIS FINDINGS VASCULAR Aorta: Normal caliber aorta without aneurysm, dissection, vasculitis or significant stenosis. Celiac: Patent without evidence of aneurysm, dissection, vasculitis or significant stenosis. SMA: Patent without evidence of aneurysm, dissection, vasculitis or significant stenosis. Renals: Both renal arteries are patent without evidence of aneurysm, dissection, vasculitis, fibromuscular dysplasia or significant stenosis.  IMA: Patent without evidence of aneurysm, dissection, vasculitis or significant stenosis. Inflow: Patent without evidence of aneurysm, dissection, vasculitis or significant stenosis. Veins: No obvious venous abnormality within the limitations of this  arterial phase study. Review of the MIP images confirms the above findings. NON-VASCULAR Hepatobiliary: No focal liver abnormality is seen. No gallstones, gallbladder wall thickening, or biliary dilatation. Pancreas: Unremarkable. No pancreatic ductal dilatation or surrounding inflammatory changes. Spleen: Lobular splenic contour with benign-appearing capsular calcifications laterally, similar to prior and likely sequela prior trauma or infection. Adrenals/Urinary Tract: Adrenal glands are unremarkable. Kidneys are normal, without renal calculi, focal lesion, or hydronephrosis. Bladder is unremarkable. Stomach/Bowel: Stomach is within normal limits. No evidence of bowel wall thickening, distention, or inflammatory changes. Lymphatic: No pathologically enlarged lymph nodes. Reproductive: Uterus and bilateral adnexa are unremarkable. Other: No abdominopelvic ascites. Musculoskeletal: Mild lower lumbar spondylosis. Unchanged size the well-circumscribed 2.9 cm lytic lesion in the left proximal femur which demonstrates stippled internal minimal as matrix, most consistent with a benign enchondroma. Review of the MIP images confirms the above findings. IMPRESSION: 1. No evidence of acute aortic syndrome. 2. No acute findings in the chest, abdomen or pelvis. 3. Gallbladder appears unremarkable. Electronically Signed   By: Dahlia Bailiff MD   On: 05/08/2020 12:55   US Abdomen Limited RUQ (LIVER/GB)  Result Date: 05/08/2020 CLINICAL DATA:  Right upper quadrant pain with nausea and vomiting. EXAM: ULTRASOUND ABDOMEN LIMITED RIGHT UPPER QUADRANT COMPARISON:  Right upper quadrant ultrasound 12/06/2017 FINDINGS: Gallbladder: No gallstones or wall thickening visualized. No sonographic Murphy sign noted by sonographer. Common bile duct: Diameter: 3 mm Liver: No focal lesion identified. Diffusely increased parenchymal echogenicity. Portal vein is patent on color Doppler imaging with normal direction of blood flow towards the  liver. Other: None. IMPRESSION: The echogenicity of the liver is increased. This is a nonspecific finding but is most commonly seen with fatty infiltration of the liver, which in the setting of steatohepatitis can be a cause of abdominal pain. There are no obvious focal liver lesions. Gallbladder is unremarkable. Electronically Signed   By: Dahlia Bailiff MD   On: 05/08/2020 11:00    DISCHARGE EXAMINATION: Vitals:   05/13/20 1531 05/13/20 2148 05/14/20 0429 05/14/20 1230  BP: (!) 171/98 (!) 145/87 (!) 148/82 128/89  Pulse: 86 67 66 73  Resp: 16 18 20    Temp: 98.2 F (36.8 C) 98.2 F (36.8 C) 98.3 F (36.8 C) 98.2 F (36.8 C)  TempSrc: Oral Oral Oral Oral  SpO2: 100% 98% 99% 98%  Weight:      Height:       General appearance: Awake alert.  In no distress Resp: Clear to auscultation bilaterally.  Normal effort Cardio: S1-S2 is normal regular.  No S3-S4.  No rubs murmurs or bruit    DISPOSITION: Home  Discharge Instructions    Call MD for:  difficulty breathing, headache or visual disturbances   Complete by: As directed    Call MD for:  extreme fatigue   Complete by: As directed    Call MD for:  persistant dizziness or light-headedness   Complete by: As directed    Call MD for:  persistant nausea and vomiting   Complete by: As directed    Call MD for:  severe uncontrolled pain   Complete by: As directed    Call MD for:  temperature >100.4   Complete by: As directed    Diet - low sodium heart healthy   Complete by: As directed  Discharge instructions   Complete by: As directed    Please be sure to follow-up with your primary care provider in 1 week for further management of your blood pressure issues.  Follow-up with general surgery as instructed by them.  Take your medications as prescribed.  You were cared for by a hospitalist during your hospital stay. If you have any questions about your discharge medications or the care you received while you were in the hospital  after you are discharged, you can call the unit and asked to speak with the hospitalist on call if the hospitalist that took care of you is not available. Once you are discharged, your primary care physician will handle any further medical issues. Please note that NO REFILLS for any discharge medications will be authorized once you are discharged, as it is imperative that you return to your primary care physician (or establish a relationship with a primary care physician if you do not have one) for your aftercare needs so that they can reassess your need for medications and monitor your lab values. If you do not have a primary care physician, you can call [phone] for a physician referral.   Increase activity slowly   Complete by: As directed         Allergies as of 05/14/2020      Reactions   Red Dye Other (See Comments)   ALL CAUSE PROBLEMS #40 IS WORST-MIGRAINES    Compazine [prochlorperazine Maleate] Other (See Comments)   TWITCHING, CANT STAY STILL. Pt states she can tolerate promethazine   Other Other (See Comments)   FANSIDAR FOR MALARIA-ASTHMA   Vicodin [hydrocodone-acetaminophen] Itching, Nausea And Vomiting   Pt states she can tolerate acetaminophen   Wheat Bran Other (See Comments)   GLUTEN SENSITIVE   Amitriptyline Hcl    Other reaction(s): angry moods   Duloxetine Hcl    Other reaction(s): passing out, trimble, irritable   Reglan [metoclopramide] Other (See Comments)   restless   Nifedipine Nausea And Vomiting   Discussed with patient and maternal fetal medicine, patient has intolerance but not frank allergy.      Medication List    STOP taking these medications   cyclobenzaprine 10 MG tablet Commonly known as: FLEXERIL   diphenhydrAMINE 25 mg capsule Commonly known as: BENADRYL   hydrochlorothiazide 25 MG tablet Commonly known as: HYDRODIURIL   ibuprofen 600 MG tablet Commonly known as: ADVIL   ketorolac 10 MG tablet Commonly known as: TORADOL    oxyCODONE-acetaminophen 5-325 MG tablet Commonly known as: PERCOCET/ROXICET   promethazine 25 MG tablet Commonly known as: PHENERGAN     TAKE these medications   acetaminophen 500 MG tablet Commonly known as: TYLENOL Take 2 tablets (1,000 mg total) by mouth every 6 (six) hours as needed. What changed:   when to take this  reasons to take this   albuterol 108 (90 Base) MCG/ACT inhaler Commonly known as: VENTOLIN HFA Inhale 1 puff into the lungs every 6 (six) hours as needed for wheezing or shortness of breath.   ALPRAZolam 1 MG tablet Commonly known as: XANAX Take 0.5-1 mg by mouth 2 (two) times daily as needed for anxiety.   amLODipine 10 MG tablet Commonly known as: NORVASC Take 1 tablet (10 mg total) by mouth daily.   cetirizine 10 MG tablet Commonly known as: ZYRTEC Take 10 mg by mouth daily.   hydrALAZINE 100 MG tablet Commonly known as: APRESOLINE Take 1 tablet (100 mg total) by mouth 3 (three) times daily.  What changed:   medication strength  how much to take   labetalol 300 MG tablet Commonly known as: NORMODYNE Take 1 tablet (300 mg total) by mouth 2 (two) times daily. What changed:   medication strength  how much to take  when to take this   levothyroxine 112 MCG tablet Commonly known as: SYNTHROID Take 112 mcg by mouth daily before breakfast.   oxyCODONE 5 MG immediate release tablet Commonly known as: Oxy IR/ROXICODONE Take 1 tablet (5 mg total) by mouth every 4 (four) hours as needed for moderate pain.   pantoprazole 40 MG tablet Commonly known as: PROTONIX Take 1 tablet twice daily for 2 weeks and then once daily as before What changed:   how much to take  how to take this  when to take this  additional instructions   polyethylene glycol powder 17 GM/SCOOP powder Commonly known as: GLYCOLAX/MIRALAX Take 17 g by mouth daily. What changed:   when to take this  reasons to take this   potassium chloride SA 20 MEQ  tablet Commonly known as: KLOR-CON Take 2 tablets (40 mEq total) by mouth daily for 5 days.   PRENATAL PO Take 1 tablet by mouth daily.   senna-docusate 8.6-50 MG tablet Commonly known as: Senokot-S Take 2 tablets by mouth 2 (two) times daily.   sertraline 100 MG tablet Commonly known as: ZOLOFT Take 150 mg by mouth daily. What changed: Another medication with the same name was removed. Continue taking this medication, and follow the directions you see here.   sucralfate 1 GM/10ML suspension Commonly known as: CARAFATE Take 10 mLs (1 g total) by mouth 4 (four) times daily -  with meals and at bedtime.   traMADol 50 MG tablet Commonly known as: ULTRAM Take 50 mg by mouth 2 (two) times daily as needed for severe pain.         Follow-up Information    Surgery, [place]. Go on 06/05/2020.   Specialty: General Surgery Why: 8:45 AM. Please arrive 30 min prior to appointment time. Bring photo ID and insurance information. Contact information: [place] [address] [phone]        Maurice Small, MD. Schedule an appointment as soon as possible for a visit in 1 week(s).   Specialty: Family Medicine Contact information: [place] [address] [phone]               TOTAL DISCHARGE TIME: 78 minutes  [facility] Pager on [email or web address]  05/15/2020, 12:26 PM

## 2020-05-14 NOTE — Progress Notes (Signed)
3 Days Post-Op   Subjective/Chief Complaint: Even better today, tol diet, having flatus   Objective: Vital signs in last 24 hours: Temp:  [98.2 F (36.8 C)-98.3 F (36.8 C)] 98.3 F (36.8 C) (03/21 0429) Pulse Rate:  [66-86] 66 (03/21 0429) Resp:  [16-20] 20 (03/21 0429) BP: (145-197)/(82-115) 148/82 (03/21 0429) SpO2:  [94 %-100 %] 99 % (03/21 0429) Last BM Date: 05/07/20  Intake/Output from previous day: 03/20 0701 - 03/21 0700 In: 966 [P.O.:960; I.V.:6] Out: -  Intake/Output this shift: No intake/output data recorded.  GI: soft approp tender incisions clean  Lab Results:  Recent Labs    05/13/20 0606 05/14/20 0515  WBC 10.4 8.4  HGB 13.4 12.9  HCT 37.8 36.8  PLT 282 274   BMET Recent Labs    05/13/20 0606 05/14/20 0515  NA 140 137  K 4.1 2.9*  CL 103 98  CO2 24 28  GLUCOSE 103* 122*  BUN 13 15  CREATININE 0.94 0.94  CALCIUM 9.6 9.5   PT/INR No results for input(s): LABPROT, INR in the last 72 hours. ABG No results for input(s): PHART, HCO3 in the last 72 hours.  Invalid input(s): PCO2, PO2  Studies/Results: DG Abd Portable 2V  Result Date: 05/12/2020 CLINICAL DATA:  Increasing abdominal pain EXAM: PORTABLE ABDOMEN - 2 VIEW COMPARISON:  CT 05/08/2020 FINDINGS: Prior cholecystectomy. Gas within nondistended large and small bowel. No organomegaly, free air or suspicious calcification. IMPRESSION: No acute findings. Electronically Signed   By: Rolm Baptise M.D.   On: 05/12/2020 16:41    Anti-infectives: Anti-infectives (From admission, onward)   Start     Dose/Rate Route Frequency Ordered Stop   05/12/20 1800  cefTRIAXone (ROCEPHIN) 2 g in sodium chloride 0.9 % 100 mL IVPB  Status:  Discontinued       Note to Pharmacy: Pharmacy may adjust dosing strength / duration / interval for maximal efficacy   2 g 200 mL/hr over 30 Minutes Intravenous Every 24 hours 05/11/20 1837 05/12/20 1340   05/11/20 1845  cefTRIAXone (ROCEPHIN) 2 g in sodium chloride 0.9  % 100 mL IVPB       Note to Pharmacy: Pharmacy may adjust dosing strength, interval, or rate of medication as needed for optimal therapy for the patient Send with patient on call to the OR.  Anesthesia to complete antibiotic administration <29min prior to incision per [facility].   2 g 200 mL/hr over 30 Minutes Intravenous On call to O.R. 05/11/20 1837 05/11/20 1854   05/11/20 1837  sodium chloride 0.9 % with cefTRIAXone (ROCEPHIN) ADS Med       Note to Pharmacy: Nash Dimmer   : cabinet override      05/11/20 1837 05/11/20 1903      Assessment/Plan: POD 3 lap chole-dyskinesia- Gross -regular diet -labs normal, no concerns for surgical complication- should just need oral pain meds as written now -can dc home when medicine ready to [place] 05/14/2020

## 2020-05-15 NOTE — Anesthesia Postprocedure Evaluation (Signed)
Anesthesia Post Note  Patient: Krystal [name]  Procedure(s) Performed: ESOPHAGOGASTRODUODENOSCOPY (EGD) WITH PROPOFOL (N/A ) BIOPSY     Patient location during evaluation: Endoscopy Anesthesia Type: MAC Level of consciousness: awake and alert Pain management: pain level controlled Vital Signs Assessment: post-procedure vital signs reviewed and stable Respiratory status: spontaneous breathing, nonlabored ventilation, respiratory function stable and patient connected to nasal cannula oxygen Cardiovascular status: stable and blood pressure returned to baseline Postop Assessment: no apparent nausea or vomiting Anesthetic complications: no   No complications documented.  Last Vitals:  Vitals:   05/14/20 0429 05/14/20 1230  BP: (!) 148/82 128/89  Pulse: 66 73  Resp: 20   Temp: 36.8 C 36.8 C  SpO2: 99% 98%    Last Pain:  Vitals:   05/14/20 1230  TempSrc: Oral  PainSc:                  [name]

## 2020-05-25 ENCOUNTER — Other Ambulatory Visit (HOSPITAL_COMMUNITY): Payer: Self-pay | Admitting: Student

## 2020-05-25 ENCOUNTER — Other Ambulatory Visit: Payer: Self-pay

## 2020-05-25 ENCOUNTER — Ambulatory Visit (HOSPITAL_BASED_OUTPATIENT_CLINIC_OR_DEPARTMENT_OTHER)
Admission: RE | Admit: 2020-05-25 | Discharge: 2020-05-25 | Disposition: A | Discharge status: Home / Self Care | Payer: 59 | Source: Ambulatory Visit | Attending: Student | Admitting: Student

## 2020-05-25 ENCOUNTER — Other Ambulatory Visit (HOSPITAL_BASED_OUTPATIENT_CLINIC_OR_DEPARTMENT_OTHER): Payer: Self-pay | Admitting: Student

## 2020-05-25 DIAGNOSIS — G8918 Other acute postprocedural pain: Secondary | ICD-10-CM

## 2020-05-25 MED ORDER — IOHEXOL 300 MG/ML  SOLN
100.0000 mL | Freq: Once | INTRAMUSCULAR | Status: AC | PRN
  Administered 2020-05-25: 100 mL via INTRAVENOUS

## 2020-08-23 ENCOUNTER — Other Ambulatory Visit: Payer: Self-pay

## 2020-08-23 ENCOUNTER — Inpatient Hospital Stay (HOSPITAL_COMMUNITY): Payer: 59

## 2020-08-23 ENCOUNTER — Inpatient Hospital Stay (HOSPITAL_COMMUNITY)
Admission: EM | Admit: 2020-08-23 | Discharge: 2020-08-24 | Disposition: A | Discharge status: Home / Self Care | Payer: 59 | Attending: Obstetrics & Gynecology | Admitting: Obstetrics & Gynecology

## 2020-08-23 ENCOUNTER — Encounter (HOSPITAL_COMMUNITY): Payer: Self-pay | Admitting: Obstetrics & Gynecology

## 2020-08-23 DIAGNOSIS — O3680X0 Pregnancy with inconclusive fetal viability, not applicable or unspecified: Secondary | ICD-10-CM | POA: Diagnosis not present

## 2020-08-23 DIAGNOSIS — O4690 Antepartum hemorrhage, unspecified, unspecified trimester: Secondary | ICD-10-CM | POA: Diagnosis not present

## 2020-08-23 DIAGNOSIS — O26899 Other specified pregnancy related conditions, unspecified trimester: Secondary | ICD-10-CM

## 2020-08-23 DIAGNOSIS — O34219 Maternal care for unspecified type scar from previous cesarean delivery: Secondary | ICD-10-CM | POA: Diagnosis not present

## 2020-08-23 DIAGNOSIS — Z3A01 Less than 8 weeks gestation of pregnancy: Secondary | ICD-10-CM | POA: Insufficient documentation

## 2020-08-23 DIAGNOSIS — O209 Hemorrhage in early pregnancy, unspecified: Secondary | ICD-10-CM | POA: Diagnosis not present

## 2020-08-23 DIAGNOSIS — M545 Low back pain, unspecified: Secondary | ICD-10-CM | POA: Insufficient documentation

## 2020-08-23 DIAGNOSIS — O09291 Supervision of pregnancy with other poor reproductive or obstetric history, first trimester: Secondary | ICD-10-CM | POA: Diagnosis not present

## 2020-08-23 DIAGNOSIS — O26891 Other specified pregnancy related conditions, first trimester: Secondary | ICD-10-CM | POA: Diagnosis present

## 2020-08-23 DIAGNOSIS — Z3A00 Weeks of gestation of pregnancy not specified: Secondary | ICD-10-CM

## 2020-08-23 DIAGNOSIS — Z8751 Personal history of pre-term labor: Secondary | ICD-10-CM | POA: Diagnosis not present

## 2020-08-23 DIAGNOSIS — R1030 Lower abdominal pain, unspecified: Secondary | ICD-10-CM | POA: Diagnosis not present

## 2020-08-23 DIAGNOSIS — R109 Unspecified abdominal pain: Secondary | ICD-10-CM | POA: Diagnosis not present

## 2020-08-23 DIAGNOSIS — O219 Vomiting of pregnancy, unspecified: Secondary | ICD-10-CM | POA: Insufficient documentation

## 2020-08-23 LAB — PREGNANCY, URINE: Preg Test, Ur: POSITIVE — AB

## 2020-08-23 LAB — PROTEIN / CREATININE RATIO, URINE
Creatinine, Urine: 89.38 mg/dL
Protein Creatinine Ratio: 0.21 mg/mg{Cre} — ABNORMAL HIGH (ref 0.00–0.15)
Total Protein, Urine: 19 mg/dL

## 2020-08-23 MED ORDER — LABETALOL HCL 100 MG PO TABS
300.0000 mg | ORAL_TABLET | Freq: Once | ORAL | Status: AC
  Administered 2020-08-23: 300 mg via ORAL
  Filled 2020-08-23: qty 3

## 2020-08-23 MED ORDER — ONDANSETRON 4 MG PO TBDP
8.0000 mg | ORAL_TABLET | Freq: Once | ORAL | Status: AC
  Administered 2020-08-23: 8 mg via ORAL
  Filled 2020-08-23: qty 2

## 2020-08-23 NOTE — ED Triage Notes (Signed)
Pt c/o vaginal bleeding since this morning. States it started like spotting, then got worse to 1-2/hr. Endorses "unbearable" pain. States positive at-home pregnancy test 2 days ago, missed period 5 days ago.

## 2020-08-23 NOTE — MAU Note (Signed)
Pt reports she started off with spotting but bleeding got heavier with clotting. Bleeding has slowed  down a little and no more clots.  C/o cramping on and off as well with a constant back pain.

## 2020-08-23 NOTE — MAU Provider Note (Signed)
Chief Complaint:  Abdominal Pain and Vaginal Bleeding   Event Date/Time   First Provider Initiated Contact with Patient 08/23/20 2304     HPI: Krystal [name] is a 30 y.o. [id number] at [redacted]w[redacted]d who presents to maternity admissions reporting vaginal bleeding and abdominal pain. Patient reports she woke up this morning with some light vaginal spotting that has become more heavy as the day has gone on. She reports that she has also passed several small blood clots, but did pass one the size of a golf ball. She reports since her arrival to MAU her bleeding has decreased. She also reports some lower abdominal and back pain, which she took Tylenol for as well as used a heating pad, which did not help.   Of note, her OB history is significant for cHTN, 2 prior preterm deliveries via c-section, complicated by pre-eclampsia and HELLP syndrome. She is currently on 300mg  Labetalol BID, Hydralazine 100mg  TID, and Amlodipine 10mg  daily.   Pregnancy Course:   Past Medical History:  Diagnosis Date   Anxiety    Asthma    HELLP (hemolytic anemia/elev liver enzymes/low platelets in pregnancy) 12/08/2017   Hypertension    IC (interstitial cystitis)    Kidney stones    Migraines    Severe uncontrolled hypertension 10/08/2017   Formatting of this note might be different from the original. Diagnosed at age 71 years Blood pressure control this pregnancy has been challenging with continued severe range blood pressures at home and on prenatal visits  Current regimen: hydralazine 75 mg TID, amlodipine 10mg  daily, coreg 25mg  BID Previous medications including: lisinopril, HCTZ, amlodipine, labetalol  Intolerance to Procardia a   UTI (urinary tract infection)    OB History  Gravida Para Term Preterm AB Living  3 2 0 2 0 2  SAB IAB Ectopic Multiple Live Births  0 0 0 0 2    # Outcome Date GA Lbr Len/2nd Weight Sex Delivery Anes PTL Lv  3 Current           2 Preterm 12/07/17 [redacted]w[redacted]d  760 g F CS-LTranv Gen  LIV      Birth Comments: ELBW  1 Preterm            Past Surgical History:  Procedure Laterality Date   APPENDECTOMY     BIOPSY  05/10/2020   Procedure: BIOPSY;  Surgeon: Arta Silence, MD;  Location: [facility];  Service: Endoscopy;;   CESAREAN SECTION N/A 12/06/2017   Procedure: CESAREAN SECTION;  Surgeon: Sloan Leiter, MD;  Location: [place];  Service: Obstetrics;  Laterality: N/A;   CHOLECYSTECTOMY N/A 05/11/2020   Procedure: SINGLE SITE LAPAROSCOPIC CHOLECYSTECTOMY AND LIVER BIOSY;  Surgeon: Michael Boston, MD;  Location: [facility];  Service: General;  Laterality: N/A;   ESOPHAGOGASTRODUODENOSCOPY (EGD) WITH PROPOFOL N/A 05/10/2020   Procedure: ESOPHAGOGASTRODUODENOSCOPY (EGD) WITH PROPOFOL;  Surgeon: Arta Silence, MD;  Location: [facility];  Service: Endoscopy;  Laterality: N/A;   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   HAND RECONSTRUCTION Right    INCISION AND DRAINAGE     NASAL SEPTUM SURGERY     Family History  Problem Relation Age of Onset   Hypertension Mother    Hypertension Father    Social History   Tobacco Use   Smoking status: Never   Smokeless tobacco: Never   Tobacco comments:    She tried a cigarette and never smoked again  Vaping Use   Vaping Use: Never used  Substance Use Topics   Alcohol use: Yes    Comment: bottle of wine a week-occ   Drug use: No   Allergies  Allergen Reactions   Red Dye Other (See Comments)    ALL CAUSE PROBLEMS #40 IS WORST-MIGRAINES    Compazine [Prochlorperazine Maleate] Other (See Comments)    TWITCHING, CANT STAY STILL. Pt states she can tolerate promethazine   Other Other (See Comments)    FANSIDAR FOR MALARIA-ASTHMA   Vicodin [Hydrocodone-Acetaminophen] Itching and Nausea And Vomiting    Pt states she can tolerate acetaminophen   Wheat Bran Other (See Comments)    GLUTEN SENSITIVE   Amitriptyline Hcl     Other reaction(s): angry moods   Duloxetine Hcl     Other reaction(s): passing out,  trimble, irritable   Reglan [Metoclopramide] Other (See Comments)    restless   Nifedipine Nausea And Vomiting    Discussed with patient and maternal fetal medicine, patient has intolerance but not frank allergy.   No medications prior to admission.    I have reviewed patient's Past Medical Hx, Surgical Hx, Family Hx, Social Hx, medications and allergies.   ROS:  Review of Systems  Constitutional: Negative.   Respiratory: Negative.    Cardiovascular: Negative.   Gastrointestinal:  Positive for abdominal pain.  Genitourinary:  Positive for vaginal bleeding.  Musculoskeletal:  Positive for back pain.  Neurological: Negative.   Psychiatric/Behavioral: Negative.     Physical Exam  Patient Vitals for the past 24 hrs:  BP Temp Temp src Pulse Resp SpO2 Height Weight  08/24/20 0154 (!) 143/86 -- -- 67 18 -- -- --  08/24/20 0049 (!) 146/96 -- -- 63 -- -- -- --  08/23/20 2239 (!) 165/105 97.9 F (36.6 C) -- 72 18 -- 5\' 11"  (1.803 m) 98.9 kg  08/23/20 1858 (!) 190/122 98.7 F (37.1 C) Oral 88 18 98 % -- --   Constitutional: well-developed, well-nourished female in no acute distress.  Cardiovascular: normal rate Respiratory: normal effort GI: abd soft, non-tender MS: extremities nontender, no edema, normal ROM Neurologic: alert and oriented x 4.  GU: neg CVAT. Pelvic: NEFG, small amount of blood, cervix without lesions/masses, visually closed, No CMT Cervix: closed/thick    Labs: Results for orders placed or performed during the hospital encounter of 08/23/20 (from the past 24 hour(s))  Pregnancy, urine     Status: Abnormal   Collection Time: 08/23/20  7:33 PM  Result Value Ref Range   Preg Test, Ur POSITIVE (A) NEGATIVE  Protein / creatinine ratio, urine     Status: Abnormal   Collection Time: 08/23/20 10:49 PM  Result Value Ref Range   Creatinine, Urine 89.38 mg/dL   Total Protein, Urine 19 mg/dL   Protein Creatinine Ratio 0.21 (H) 0.00 - 0.15 mg/mg[Cre]  CBC     Status:  None   Collection Time: 08/23/20 11:41 PM  Result Value Ref Range   WBC 5.6 4.0 - 10.5 K/uL   RBC 4.38 3.87 - 5.11 MIL/uL   Hemoglobin 13.7 12.0 - 15.0 g/dL   HCT 40.4 36.0 - 46.0 %   MCV 92.2 80.0 - 100.0 fL   MCH 31.3 26.0 - 34.0 pg   MCHC 33.9 30.0 - 36.0 g/dL   RDW 12.4 11.5 - 15.5 %   Platelets 243 150 - 400 K/uL   nRBC 0.0 0.0 - 0.2 %  hCG, quantitative, pregnancy     Status: Abnormal   Collection Time: 08/23/20 11:41 PM  Result Value Ref  Range   hCG, Beta Chain, Quant, S 3,067 (H) <5 mIU/mL  ABO/Rh     Status: None   Collection Time: 08/23/20 11:41 PM  Result Value Ref Range   ABO/RH(D) O POS    No rh immune globuloin      NOT A RH IMMUNE GLOBULIN CANDIDATE, PT RH POSITIVE Performed at [facility] [address] [place], [place] [address]   Comprehensive metabolic panel     Status: Abnormal   Collection Time: 08/23/20 11:41 PM  Result Value Ref Range   Sodium 138 135 - 145 mmol/L   Potassium 3.2 (L) 3.5 - 5.1 mmol/L   Chloride 103 98 - 111 mmol/L   CO2 25 22 - 32 mmol/L   Glucose, Bld 96 70 - 99 mg/dL   BUN <5 (L) 6 - 20 mg/dL   Creatinine, Ser 0.68 0.44 - 1.00 mg/dL   Calcium 8.9 8.9 - 10.3 mg/dL   Total Protein 6.6 6.5 - 8.1 g/dL   Albumin 4.2 3.5 - 5.0 g/dL   AST 48 (H) 15 - 41 U/L   ALT 60 (H) 0 - 44 U/L   Alkaline Phosphatase 54 38 - 126 U/L   Total Bilirubin 0.6 0.3 - 1.2 mg/dL   GFR, Estimated >60 >60 mL/min   Anion gap 10 5 - 15  Wet prep, genital     Status: Abnormal   Collection Time: 08/23/20 11:42 PM   Specimen: Vaginal  Result Value Ref Range   Yeast Wet Prep HPF POC NONE SEEN NONE SEEN   Trich, Wet Prep NONE SEEN NONE SEEN   Clue Cells Wet Prep HPF POC NONE SEEN NONE SEEN   WBC, Wet Prep HPF POC FEW (A) NONE SEEN   Sperm NONE SEEN     Imaging:  US OB LESS THAN 14 WEEKS WITH OB TRANSVAGINAL  Result Date: 08/24/2020 CLINICAL DATA:  Vaginal bleeding EXAM: OBSTETRIC <14 WK [place] AND TRANSVAGINAL OB US TECHNIQUE: Both transabdominal and  transvaginal ultrasound examinations were performed for complete evaluation of the gestation as well as the maternal uterus, adnexal regions, and pelvic cul-de-sac. Transvaginal technique was performed to assess early pregnancy. COMPARISON:  None. FINDINGS: Intrauterine gestational sac: None Yolk sac:  Not Visualized. Embryo:  Not Visualized. Cardiac Activity: Not Visualized. Heart Rate:   bpm MSD:   mm    w     d CRL:    mm    w    d                  [place] EDC: Subchorionic hemorrhage:  None visualized. Maternal uterus/adnexae: No adnexal mass or free fluid. IMPRESSION: No intrauterine pregnancy visualized. Differential considerations would include early intrauterine pregnancy too early to visualize, spontaneous abortion, or occult ectopic pregnancy. Recommend close clinical followup and serial quantitative beta HCGs and ultrasounds. Electronically Signed   By: Rolm Baptise M.D.   On: 08/24/2020 00:11    MAU Course: Orders Placed This Encounter  Procedures   Wet prep, genital   US OB LESS THAN 14 WEEKS WITH OB TRANSVAGINAL   Pregnancy, urine   CBC   hCG, quantitative, pregnancy   Comprehensive metabolic panel   Protein / creatinine ratio, urine   Urinalysis, Routine w reflex microscopic Urine, Clean Catch   POC Urine Pregnancy, ED (not at [facility])   ABO/Rh   Discharge patient   Meds ordered this encounter  Medications   ondansetron (ZOFRAN-ODT) disintegrating tablet 8 mg   labetalol (NORMODYNE)  tablet 300 mg   labetalol (NORMODYNE) 300 MG tablet    Sig: Take 2 tablets (600 mg total) by mouth 2 (two) times daily.    Dispense:  60 tablet    Refill:  2    Order Specific Question:   Supervising Provider    Answer:   Donnamae Jude [[id number]]    MDM: UA CBC,CMP, UPCR HCG 3,067 ABO/RH, blood type O pos, rhogam not indicated [place] with results as above. No IUP visualized. BP initially 190/122. Recheck 165/105. Night time dose of labetalol 300mg  along with zofran given. Discussed patient with Dr. Nelda Marseille  who recommends patient return in 48 hours for repeat hcg. Also recommends increasing labetalol to 600mg  BID.    Assessment: 1. Pregnancy, location unknown   2. Abdominal pain affecting pregnancy   3. Vaginal bleeding affecting early pregnancy     Plan: Discharge home in stable condition.  Strict return precautions reviewed with patient.    Follow-up Information     [facility] Follow up.   Specialty: Obstetrics and Gynecology Why: on Sunday 7/3 for repeat labs. Return to MAU as needed for emergencies. Contact information: [address] [id number] [place] [phone]                Allergies as of 08/24/2020       Reactions   Red Dye Other (See Comments)   ALL CAUSE PROBLEMS #40 IS WORST-MIGRAINES    Compazine [prochlorperazine Maleate] Other (See Comments)   TWITCHING, CANT STAY STILL. Pt states she can tolerate promethazine   Other Other (See Comments)   FANSIDAR FOR MALARIA-ASTHMA   Vicodin [hydrocodone-acetaminophen] Itching, Nausea And Vomiting   Pt states she can tolerate acetaminophen   Wheat Bran Other (See Comments)   GLUTEN SENSITIVE   Amitriptyline Hcl    Other reaction(s): angry moods   Duloxetine Hcl    Other reaction(s): passing out, trimble, irritable   Reglan [metoclopramide] Other (See Comments)   restless   Nifedipine Nausea And Vomiting   Discussed with patient and maternal fetal medicine, patient has intolerance but not frank allergy.        Medication List     STOP taking these medications    ALPRAZolam 1 MG tablet Commonly known as: XANAX   oxyCODONE 5 MG immediate release tablet Commonly known as: Oxy IR/ROXICODONE   potassium chloride SA 20 MEQ tablet Commonly known as: KLOR-CON   traMADol 50 MG tablet Commonly known as: ULTRAM       TAKE these medications    acetaminophen 500 MG tablet Commonly known as: TYLENOL Take 2 tablets (1,000 mg total) by mouth  every 6 (six) hours as needed.   albuterol 108 (90 Base) MCG/ACT inhaler Commonly known as: VENTOLIN HFA Inhale 1 puff into the lungs every 6 (six) hours as needed for wheezing or shortness of breath.   amLODipine 10 MG tablet Commonly known as: NORVASC Take 1 tablet (10 mg total) by mouth daily.   cetirizine 10 MG tablet Commonly known as: ZYRTEC Take 10 mg by mouth daily.   hydrALAZINE 100 MG tablet Commonly known as: APRESOLINE Take 1 tablet (100 mg total) by mouth 3 (three) times daily.   labetalol 300 MG tablet Commonly known as: NORMODYNE Take 2 tablets (600 mg total) by mouth 2 (two) times daily. What changed: how much to take   levothyroxine 112 MCG tablet Commonly known as: SYNTHROID Take 112 mcg by mouth daily before breakfast.  pantoprazole 40 MG tablet Commonly known as: PROTONIX Take 1 tablet twice daily for 2 weeks and then once daily as before   polyethylene glycol powder 17 GM/SCOOP powder Commonly known as: GLYCOLAX/MIRALAX Take 17 g by mouth daily.   PRENATAL PO Take 1 tablet by mouth daily.   senna-docusate 8.6-50 MG tablet Commonly known as: Senokot-S Take 2 tablets by mouth 2 (two) times daily.   sertraline 100 MG tablet Commonly known as: ZOLOFT Take 150 mg by mouth daily.   sucralfate 1 GM/10ML suspension Commonly known as: CARAFATE Take 10 mLs (1 g total) by mouth 4 (four) times daily -  with meals and at bedtime.        Maryagnes Amos, MSN, CNM 08/24/2020 2:58 AM

## 2020-08-23 NOTE — ED Provider Notes (Signed)
Emergency Medicine Provider OB Triage Evaluation Note  Krystal [name] is a 30 y.o. female, G1P0101, at Unknown gestation who presents to the emergency department with complaints of vaginal bleeding and abdominal pain.  Reports that vaginal bleeding started this morning as spotting.  The bleeding has gotten gradually worse since then.  Patient reports that she is now switching pads once every hour.  Also endorses lower abdominal pain.  Is intermittent and described as cramping.  Patient has associated nausea and vomiting.  Patient reports vomiting 4 times today.  Patient denies any coffee-ground emesis or hematemesis.  Also endorses low back pain.  Low back pain has been constant throughout the day.  Patient took at home pregnancy test 2 days prior which was positive.  P5/14. G4 P0-2-1-2.  Patient reports that she had preeclampsia and help during previous pregnancies.  Reports that she has a history of hypertension and states that she threw up all of her medications this morning after taking them.  Review of  Systems  Positive: Vaginal bleeding, abdominal pain, back pain Negative: Headache, chest pain, shortness of breath, vaginal  Physical Exam  BP (!) 190/122   Pulse 88   Temp 98.7 F (37.1 C) (Oral)   Resp 18   SpO2 98%  General: Awake, no distress  HEENT: Atraumatic  Resp: Normal effort  Cardiac: Normal rate Abd: Nondistended, soft, tenderness over left lower quadrant MSK: Moves all extremities without difficulty Neuro: Speech clear  Medical Decision Making  Pt evaluated for pregnancy concern and is stable for transfer to MAU. Pt is in agreement with plan for transfer.  7:57 PM Discussed with MAU APP, Threasa Beards, who accepts patient in transfer.  Clinical Impression  No diagnosis found.     Loni Beckwith, PA-C 08/23/20 [place], [place], DO 08/23/20 2209

## 2020-08-24 ENCOUNTER — Encounter (HOSPITAL_COMMUNITY): Payer: Self-pay | Admitting: Obstetrics & Gynecology

## 2020-08-24 DIAGNOSIS — O26891 Other specified pregnancy related conditions, first trimester: Secondary | ICD-10-CM | POA: Diagnosis not present

## 2020-08-24 LAB — CBC
HCT: 40.4 % (ref 36.0–46.0)
Hemoglobin: 13.7 g/dL (ref 12.0–15.0)
MCH: 31.3 pg (ref 26.0–34.0)
MCHC: 33.9 g/dL (ref 30.0–36.0)
MCV: 92.2 fL (ref 80.0–100.0)
Platelets: 243 10*3/uL (ref 150–400)
RBC: 4.38 MIL/uL (ref 3.87–5.11)
RDW: 12.4 % (ref 11.5–15.5)
WBC: 5.6 10*3/uL (ref 4.0–10.5)
nRBC: 0 % (ref 0.0–0.2)

## 2020-08-24 LAB — COMPREHENSIVE METABOLIC PANEL
ALT: 60 U/L — ABNORMAL HIGH (ref 0–44)
AST: 48 U/L — ABNORMAL HIGH (ref 15–41)
Albumin: 4.2 g/dL (ref 3.5–5.0)
Alkaline Phosphatase: 54 U/L (ref 38–126)
Anion gap: 10 (ref 5–15)
BUN: <5 mg/dL — ABNORMAL LOW (ref 6–20)
CO2: 25 mmol/L (ref 22–32)
Calcium: 8.9 mg/dL (ref 8.9–10.3)
Chloride: 103 mmol/L (ref 98–111)
Creatinine, Ser: 0.68 mg/dL (ref 0.44–1.00)
GFR, Estimated: >60 mL/min (ref >60)
Glucose, Bld: 96 mg/dL (ref 70–99)
Potassium: 3.2 mmol/L — ABNORMAL LOW (ref 3.5–5.1)
Sodium: 138 mmol/L (ref 135–145)
Total Bilirubin: 0.6 mg/dL (ref 0.3–1.2)
Total Protein: 6.6 g/dL (ref 6.5–8.1)

## 2020-08-24 LAB — WET PREP, GENITAL
Clue Cells Wet Prep HPF POC: NONE SEEN
Sperm: NONE SEEN
Trich, Wet Prep: NONE SEEN
Yeast Wet Prep HPF POC: NONE SEEN

## 2020-08-24 LAB — ABO/RH: ABO/RH(D): O POS

## 2020-08-24 LAB — GC/CHLAMYDIA PROBE AMP (~~LOC~~) NOT AT ARMC
Chlamydia: NEGATIVE
Comment: NEGATIVE
Comment: NORMAL
Neisseria Gonorrhea: NEGATIVE

## 2020-08-24 LAB — HCG, QUANTITATIVE, PREGNANCY: hCG, Beta Chain, Quant, S: 3067 m[IU]/mL — ABNORMAL HIGH (ref <5)

## 2020-08-24 MED ORDER — LABETALOL HCL 300 MG PO TABS: 600.0000 mg | ORAL_TABLET | Freq: Two times a day (BID) | ORAL | 2 refills | Status: DC

## 2020-08-26 ENCOUNTER — Other Ambulatory Visit: Payer: Self-pay

## 2020-08-26 ENCOUNTER — Inpatient Hospital Stay (HOSPITAL_COMMUNITY)
Admission: AD | Admit: 2020-08-26 | Discharge: 2020-08-26 | Disposition: A | Discharge status: Home / Self Care | Payer: 59 | Attending: Obstetrics and Gynecology | Admitting: Obstetrics and Gynecology

## 2020-08-26 DIAGNOSIS — O99341 Other mental disorders complicating pregnancy, first trimester: Secondary | ICD-10-CM | POA: Insufficient documentation

## 2020-08-26 DIAGNOSIS — O161 Unspecified maternal hypertension, first trimester: Secondary | ICD-10-CM | POA: Diagnosis not present

## 2020-08-26 DIAGNOSIS — Z3A01 Less than 8 weeks gestation of pregnancy: Secondary | ICD-10-CM | POA: Diagnosis not present

## 2020-08-26 DIAGNOSIS — Z885 Allergy status to narcotic agent status: Secondary | ICD-10-CM | POA: Diagnosis not present

## 2020-08-26 DIAGNOSIS — Z888 Allergy status to other drugs, medicaments and biological substances status: Secondary | ICD-10-CM | POA: Diagnosis not present

## 2020-08-26 DIAGNOSIS — J45909 Unspecified asthma, uncomplicated: Secondary | ICD-10-CM | POA: Insufficient documentation

## 2020-08-26 DIAGNOSIS — O039 Complete or unspecified spontaneous abortion without complication: Secondary | ICD-10-CM

## 2020-08-26 DIAGNOSIS — O99511 Diseases of the respiratory system complicating pregnancy, first trimester: Secondary | ICD-10-CM | POA: Insufficient documentation

## 2020-08-26 DIAGNOSIS — Z79899 Other long term (current) drug therapy: Secondary | ICD-10-CM | POA: Diagnosis not present

## 2020-08-26 DIAGNOSIS — F419 Anxiety disorder, unspecified: Secondary | ICD-10-CM | POA: Insufficient documentation

## 2020-08-26 LAB — URINALYSIS, ROUTINE W REFLEX MICROSCOPIC
Bilirubin Urine: NEGATIVE
Glucose, UA: NEGATIVE mg/dL
Ketones, ur: NEGATIVE mg/dL
Nitrite: NEGATIVE
Protein, ur: 30 mg/dL — AB
RBC / HPF: >50 RBC/hpf — ABNORMAL HIGH (ref 0–5)
Specific Gravity, Urine: 1.019 (ref 1.005–1.030)
pH: 7 (ref 5.0–8.0)

## 2020-08-26 LAB — HCG, QUANTITATIVE, PREGNANCY: hCG, Beta Chain, Quant, S: 650 m[IU]/mL — ABNORMAL HIGH (ref <5)

## 2020-08-26 NOTE — MAU Provider Note (Signed)
History     CSN: [id number]  Arrival date and time: 08/26/20 0904   Event Date/Time   First Provider Initiated Contact with Patient 08/26/20 [phone]      Chief Complaint  Patient presents with   Follow-up   Krystal [name] is a 30 y.o. [id number] at [redacted]w[redacted]d who presents today for 48 hour repeat HCG. She had [place] that showed PUL. Today she is having worsening left side/back pain. She denies any bleeding.   Pelvic Pain The patient's primary symptoms include pelvic pain. This is a new problem. The current episode started today. The problem occurs constantly. The problem has been unchanged. The problem affects the left side. She is pregnant. The vaginal discharge was normal. There has been no bleeding. Nothing aggravates the symptoms. She has tried acetaminophen for the symptoms. The treatment provided no relief.   OB History     Gravida  3   Para  2   Term  0   Preterm  2   AB  0   Living  2      SAB  0   IAB  0   Ectopic  0   Multiple  0   Live Births  2           Past Medical History:  Diagnosis Date   Anxiety    Asthma    HELLP (hemolytic anemia/elev liver enzymes/low platelets in pregnancy) 12/08/2017   Hypertension    IC (interstitial cystitis)    Kidney stones    Migraines    Severe uncontrolled hypertension 10/08/2017   Formatting of this note might be different from the original. Diagnosed at age 81 years Blood pressure control this pregnancy has been challenging with continued severe range blood pressures at home and on prenatal visits  Current regimen: hydralazine 75 mg TID, amlodipine 10mg  daily, coreg 25mg  BID Previous medications including: lisinopril, HCTZ, amlodipine, labetalol  Intolerance to Procardia a   UTI (urinary tract infection)     Past Surgical History:  Procedure Laterality Date   APPENDECTOMY     BIOPSY  05/10/2020   Procedure: BIOPSY;  Surgeon: Arta Silence, MD;  Location: [facility];  Service: Endoscopy;;   CESAREAN SECTION  N/A 12/06/2017   Procedure: CESAREAN SECTION;  Surgeon: Sloan Leiter, MD;  Location: [facility];  Service: Obstetrics;  Laterality: N/A;   CHOLECYSTECTOMY N/A 05/11/2020   Procedure: SINGLE SITE LAPAROSCOPIC CHOLECYSTECTOMY AND LIVER BIOSY;  Surgeon: Michael Boston, MD;  Location: [facility];  Service: General;  Laterality: N/A;   ESOPHAGOGASTRODUODENOSCOPY (EGD) WITH PROPOFOL N/A 05/10/2020   Procedure: ESOPHAGOGASTRODUODENOSCOPY (EGD) WITH PROPOFOL;  Surgeon: Arta Silence, MD;  Location: [facility];  Service: Endoscopy;  Laterality: N/A;   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   HAND RECONSTRUCTION Right    INCISION AND DRAINAGE     NASAL SEPTUM SURGERY      Family History  Problem Relation Age of Onset   Hypertension Mother    Hypertension Father     Social History   Tobacco Use   Smoking status: Never   Smokeless tobacco: Never   Tobacco comments:    She tried a cigarette and never smoked again  Vaping Use   Vaping Use: Never used  Substance Use Topics   Alcohol use: Yes    Comment: bottle of wine a week-occ   Drug use: No    Allergies:  Allergies  Allergen Reactions   Red Dye Other (See Comments)  ALL CAUSE PROBLEMS #40 IS WORST-MIGRAINES    Compazine [Prochlorperazine Maleate] Other (See Comments)    TWITCHING, CANT STAY STILL. Pt states she can tolerate promethazine   Other Other (See Comments)    FANSIDAR FOR MALARIA-ASTHMA   Vicodin [Hydrocodone-Acetaminophen] Itching and Nausea And Vomiting    Pt states she can tolerate acetaminophen   Wheat Bran Other (See Comments)    GLUTEN SENSITIVE   Amitriptyline Hcl     Other reaction(s): angry moods   Duloxetine Hcl     Other reaction(s): passing out, trimble, irritable   Reglan [Metoclopramide] Other (See Comments)    restless   Nifedipine Nausea And Vomiting    Discussed with patient and maternal fetal medicine, patient has intolerance but not frank allergy.    Medications Prior to  Admission  Medication Sig Dispense Refill Last Dose   acetaminophen (TYLENOL) 500 MG tablet Take 2 tablets (1,000 mg total) by mouth every 6 (six) hours as needed. 30 tablet 0    albuterol (PROVENTIL HFA;VENTOLIN HFA) 108 (90 Base) MCG/ACT inhaler Inhale 1 puff into the lungs every 6 (six) hours as needed for wheezing or shortness of breath.      amLODipine (NORVASC) 10 MG tablet Take 1 tablet (10 mg total) by mouth daily. 30 tablet 2    cetirizine (ZYRTEC) 10 MG tablet Take 10 mg by mouth daily.      hydrALAZINE (APRESOLINE) 100 MG tablet Take 1 tablet (100 mg total) by mouth 3 (three) times daily. 90 tablet 1    labetalol (NORMODYNE) 300 MG tablet Take 2 tablets (600 mg total) by mouth 2 (two) times daily. 60 tablet 2    levothyroxine (SYNTHROID) 112 MCG tablet Take 112 mcg by mouth daily before breakfast.      pantoprazole (PROTONIX) 40 MG tablet Take 1 tablet twice daily for 2 weeks and then once daily as before 60 tablet 0    polyethylene glycol powder (GLYCOLAX/MIRALAX) 17 GM/SCOOP powder Take 17 g by mouth daily. 500 g 0    Prenatal Vit-Fe Fumarate-FA (PRENATAL PO) Take 1 tablet by mouth daily.      senna-docusate (SENOKOT-S) 8.6-50 MG tablet Take 2 tablets by mouth 2 (two) times daily. 60 tablet 0    sertraline (ZOLOFT) 100 MG tablet Take 150 mg by mouth daily.      sucralfate (CARAFATE) 1 GM/10ML suspension Take 10 mLs (1 g total) by mouth 4 (four) times daily -  with meals and at bedtime. 420 mL 0     Review of Systems  Genitourinary:  Positive for pelvic pain.  All other systems reviewed and are negative. Physical Exam   Blood pressure 134/85, pulse 87, temperature 98 F (36.7 C), temperature source Oral, resp. rate 17, last menstrual period 07/17/2020, SpO2 98 %, unknown if currently breastfeeding.  Physical Exam Vitals and nursing note reviewed.  Constitutional:      Appearance: She is well-developed.  HENT:     Head: Normocephalic.  Eyes:     Pupils: Pupils are equal,  round, and reactive to light.  Cardiovascular:     Rate and Rhythm: Normal rate and regular rhythm.     Heart sounds: Normal heart sounds.  Pulmonary:     Effort: Pulmonary effort is normal. No respiratory distress.     Breath sounds: Normal breath sounds.  Abdominal:     Palpations: Abdomen is soft.     Tenderness: There is no abdominal tenderness.  Genitourinary:    Vagina: No bleeding. Vaginal discharge: mucusy.  Comments:      Musculoskeletal:        General: Normal range of motion.     Cervical back: Normal range of motion and neck supple.  Skin:    General: Skin is warm and dry.  Neurological:     Mental Status: She is alert and oriented to person, place, and time.  Psychiatric:        Mood and Affect: Mood normal.        Behavior: Behavior normal.    Results for orders placed or performed during the hospital encounter of 08/26/20 (from the past 24 hour(s))  hCG, quantitative, pregnancy     Status: Abnormal   Collection Time: 08/26/20  9:25 AM  Result Value Ref Range   hCG, Beta Chain, Quant, S 650 (H) <5 mIU/mL  Urinalysis, Routine w reflex microscopic Urine, Clean Catch     Status: Abnormal   Collection Time: 08/26/20  9:50 AM  Result Value Ref Range   Color, Urine YELLOW YELLOW   APPearance HAZY (A) CLEAR   Specific Gravity, Urine 1.019 1.005 - 1.030   pH 7.0 5.0 - 8.0   Glucose, UA NEGATIVE NEGATIVE mg/dL   Hgb urine dipstick LARGE (A) NEGATIVE   Bilirubin Urine NEGATIVE NEGATIVE   Ketones, ur NEGATIVE NEGATIVE mg/dL   Protein, ur 30 (A) NEGATIVE mg/dL   Nitrite NEGATIVE NEGATIVE   Leukocytes,Ua TRACE (A) NEGATIVE   RBC / HPF >50 (H) 0 - 5 RBC/hpf   WBC, UA 11-20 0 - 5 WBC/hpf   Bacteria, UA RARE (A) NONE SEEN   Squamous Epithelial / LPF 0-5 0 - 5   Mucus PRESENT    Results for DEISSY, GUILBERT (MRN [id number]) as of 08/26/2020 10:37  Ref. Range 08/23/2020 23:41 08/23/2020 23:42 08/24/2020 00:06 08/26/2020 09:25  HCG, Beta Chain, Quant, S Latest Ref  Range: <5 mIU/mL 3,067 (H)   650 (H)    MAU Course  Procedures  MDM > 50% decline in approx 48 hours. This is indicates a miscarriage. Reviewed with patient, and will send message to clinic for FU in 2 weeks. Continue blood pressure medications as prescribed.  Urine Culture pending, will call patient if treatment is needed.   Assessment and Plan   1. SAB (spontaneous abortion)   2. [redacted] weeks gestation of pregnancy    DC home in stable condition  Comfort measures reviewed  Bleeding precautions RX: no new rx, continue B/P meds as prescribed  Return to MAU as needed FU with OB as planned   [place] [facility] Follow up in 2 week(s).   Specialty: Obstetrics and Gynecology Why: They will call you with an appointment Contact information: [place] [phone] [place], CNM  08/26/20  10:42 AM

## 2020-08-26 NOTE — MAU Note (Signed)
Pt reports to [facility]au for follow up lab work.  Pt reports bleeding is lighter than it was a few days ago and her pain is the same.

## 2020-08-27 LAB — CULTURE, OB URINE: Culture: NO GROWTH

## 2020-09-13 ENCOUNTER — Ambulatory Visit: Payer: 59 | Admitting: Obstetrics and Gynecology

## 2020-09-19 ENCOUNTER — Emergency Department (HOSPITAL_COMMUNITY)
Admission: EM | Admit: 2020-09-19 | Discharge: 2020-09-19 | Disposition: A | Discharge status: Home / Self Care | Payer: 59 | Attending: Emergency Medicine | Admitting: Emergency Medicine

## 2020-09-19 ENCOUNTER — Encounter (HOSPITAL_COMMUNITY): Payer: Self-pay | Admitting: Emergency Medicine

## 2020-09-19 ENCOUNTER — Emergency Department (HOSPITAL_COMMUNITY): Payer: 59

## 2020-09-19 DIAGNOSIS — F419 Anxiety disorder, unspecified: Secondary | ICD-10-CM | POA: Insufficient documentation

## 2020-09-19 DIAGNOSIS — Z5321 Procedure and treatment not carried out due to patient leaving prior to being seen by health care provider: Secondary | ICD-10-CM | POA: Insufficient documentation

## 2020-09-19 DIAGNOSIS — R079 Chest pain, unspecified: Secondary | ICD-10-CM | POA: Insufficient documentation

## 2020-09-19 LAB — TROPONIN I (HIGH SENSITIVITY)
Troponin I (High Sensitivity): 5 ng/L (ref <18)
Troponin I (High Sensitivity): 6 ng/L (ref <18)

## 2020-09-19 LAB — CBC WITH DIFFERENTIAL/PLATELET
Abs Immature Granulocytes: 0.05 10*3/uL (ref 0.00–0.07)
Basophils Absolute: 0.1 10*3/uL (ref 0.0–0.1)
Basophils Relative: 1 %
Eosinophils Absolute: 0 10*3/uL (ref 0.0–0.5)
Eosinophils Relative: 0 %
HCT: 44.5 % (ref 36.0–46.0)
Hemoglobin: 15.3 g/dL — ABNORMAL HIGH (ref 12.0–15.0)
Immature Granulocytes: 1 %
Lymphocytes Relative: 32 %
Lymphs Abs: 2.3 10*3/uL (ref 0.7–4.0)
MCH: 31.5 pg (ref 26.0–34.0)
MCHC: 34.4 g/dL (ref 30.0–36.0)
MCV: 91.6 fL (ref 80.0–100.0)
Monocytes Absolute: 0.6 10*3/uL (ref 0.1–1.0)
Monocytes Relative: 8 %
Neutro Abs: 4.1 10*3/uL (ref 1.7–7.7)
Neutrophils Relative %: 58 %
Platelets: 137 10*3/uL — ABNORMAL LOW (ref 150–400)
RBC: 4.86 MIL/uL (ref 3.87–5.11)
RDW: 12.6 % (ref 11.5–15.5)
WBC: 7.1 10*3/uL (ref 4.0–10.5)
nRBC: 0 % (ref 0.0–0.2)

## 2020-09-19 LAB — BASIC METABOLIC PANEL
Anion gap: 8 (ref 5–15)
BUN: 14 mg/dL (ref 6–20)
CO2: 21 mmol/L — ABNORMAL LOW (ref 22–32)
Calcium: 9.4 mg/dL (ref 8.9–10.3)
Chloride: 107 mmol/L (ref 98–111)
Creatinine, Ser: 0.73 mg/dL (ref 0.44–1.00)
GFR, Estimated: >60 mL/min (ref >60)
Glucose, Bld: 91 mg/dL (ref 70–99)
Potassium: 3.9 mmol/L (ref 3.5–5.1)
Sodium: 136 mmol/L (ref 135–145)

## 2020-09-19 NOTE — ED Notes (Signed)
Called pts name 3x for updated VS with no response. Pt not seen in lobby or outside at this time.
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
## 2020-09-24 DIAGNOSIS — O039 Complete or unspecified spontaneous abortion without complication: Secondary | ICD-10-CM

## 2020-09-24 HISTORY — DX: Complete or unspecified spontaneous abortion without complication: O03.9

## 2020-09-26 ENCOUNTER — Ambulatory Visit: Payer: 59 | Admitting: Certified Nurse Midwife

## 2020-09-26 ENCOUNTER — Other Ambulatory Visit: Payer: Self-pay

## 2020-09-26 ENCOUNTER — Ambulatory Visit (INDEPENDENT_AMBULATORY_CARE_PROVIDER_SITE_OTHER): Payer: 59 | Admitting: Certified Nurse Midwife

## 2020-09-26 ENCOUNTER — Encounter: Payer: Self-pay | Admitting: Certified Nurse Midwife

## 2020-09-26 VITALS — BP 157/121 | HR 79 | Wt 211.1 lb

## 2020-09-26 DIAGNOSIS — Z8759 Personal history of other complications of pregnancy, childbirth and the puerperium: Secondary | ICD-10-CM

## 2020-09-26 DIAGNOSIS — F3289 Other specified depressive episodes: Secondary | ICD-10-CM

## 2020-09-26 DIAGNOSIS — Z01419 Encounter for gynecological examination (general) (routine) without abnormal findings: Secondary | ICD-10-CM

## 2020-09-26 DIAGNOSIS — F4321 Adjustment disorder with depressed mood: Secondary | ICD-10-CM | POA: Diagnosis not present

## 2020-09-26 NOTE — Patient Instructions (Signed)
[facility] [address]  [place], [place] [address] [phone]  Book: Emotionally Immature Parents  [facility] services
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
## 2020-10-09 ENCOUNTER — Other Ambulatory Visit: Payer: Self-pay

## 2020-10-09 ENCOUNTER — Ambulatory Visit: Payer: 59 | Admitting: Clinical

## 2020-10-09 ENCOUNTER — Ambulatory Visit (INDEPENDENT_AMBULATORY_CARE_PROVIDER_SITE_OTHER): Payer: 59 | Admitting: Family Medicine

## 2020-10-09 ENCOUNTER — Encounter: Payer: Self-pay | Admitting: Family Medicine

## 2020-10-09 VITALS — BP 141/97 | HR 82 | Wt 209.3 lb

## 2020-10-09 DIAGNOSIS — Z1331 Encounter for screening for depression: Secondary | ICD-10-CM

## 2020-10-09 DIAGNOSIS — Z3009 Encounter for other general counseling and advice on contraception: Secondary | ICD-10-CM | POA: Diagnosis not present

## 2020-10-09 DIAGNOSIS — F419 Anxiety disorder, unspecified: Secondary | ICD-10-CM | POA: Diagnosis not present

## 2020-10-09 MED ORDER — ETONOGESTREL-ETHINYL ESTRADIOL 0.12-0.015 MG/24HR VA RING: VAGINAL_RING | VAGINAL | 12 refills | Status: DC
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
## 2020-10-09 NOTE — Progress Notes (Signed)
Patient very stressed out about father's health and proceeded to cry. BHT was offered and patient stated that she already has appointment with someone. Patient agreed to me putting in a referral to [place] just in case she does not want to use the other therapist

## 2020-10-09 NOTE — BH Specialist Note (Signed)
[place] Initial In-Person Visit  MRN: [id number] Name: Nana Cohn Puls  Less than 15 minute warm hand-off, per referral after miscarriage; pt would like to schedule virtual consult for 10/17/20 at 3:45pm.   Garlan Fair, LCSW  Depression screen [facility] 2/9 09/26/2020 12/22/2017  Decreased Interest 2 1  Down, Depressed, Hopeless 3 1  PHQ - 2 Score 5 2  Altered sleeping 3 2  Tired, decreased energy 2 2  Change in appetite 3 0  Feeling bad or failure about yourself  2 2  Trouble concentrating 2 0  Moving slowly or fidgety/restless 3 0  Suicidal thoughts 0 0  PHQ-9 Score 20 8   GAD 7 : Generalized Anxiety Score 09/26/2020 12/22/2017  Nervous, Anxious, on Edge 3 1  Control/stop worrying 3 0  Worry too much - different things 3 0  Trouble relaxing 3 0  Restless 2 0  Easily annoyed or irritable 3 0  Afraid - awful might happen 3 2  Total GAD 7 Score 20 3
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
## 2020-10-17 ENCOUNTER — Ambulatory Visit (INDEPENDENT_AMBULATORY_CARE_PROVIDER_SITE_OTHER): Payer: 59 | Admitting: Clinical

## 2020-10-17 DIAGNOSIS — F4321 Adjustment disorder with depressed mood: Secondary | ICD-10-CM | POA: Diagnosis not present

## 2020-11-02 ENCOUNTER — Ambulatory Visit (INDEPENDENT_AMBULATORY_CARE_PROVIDER_SITE_OTHER): Payer: 59 | Admitting: Clinical

## 2020-11-02 DIAGNOSIS — F4321 Adjustment disorder with depressed mood: Secondary | ICD-10-CM | POA: Diagnosis not present

## 2020-11-02 NOTE — BH Specialist Note (Signed)
Integrated Behavioral Health via Telemedicine Visit  11/02/2020 KAWSAR BOULETTE [id number]  Number of Integrated Behavioral Health visits: 3 Session Start time: 8:16  Session End time: 9:26 Total time:  22  Referring Provider: Renard Matter, MD Patient/Family location: Home [facility] Provider location: Center for [name]  All persons participating in visit: [name] and [place]   Types of Service: Individual psychotherapy and Video visit  I connected with Krystal [name] and/or Krystal [name]'s  n/a  via  Telephone or Video Enabled Telemedicine Application  (Video is Caregility application) and verified that I am speaking with the correct person using two identifiers. Discussed confidentiality: Yes   I discussed the limitations of telemedicine and the availability of in person appointments.  Discussed there is a possibility of technology failure and discussed alternative modes of communication if that failure occurs.  I discussed that engaging in this telemedicine visit, they consent to the provision of behavioral healthcare and the services will be billed under their insurance.  Patient and/or legal guardian expressed understanding and consented to Telemedicine visit: Yes   Presenting Concerns: Patient and/or family reports the following symptoms/concerns: Marital discord as primary concern today, along with family-of-origin estrangement, both making it difficult to process emotions regarding most recent pregnancy loss; Goal is to "have a home with happy kids and work in garden without judgement".  Duration of problem: Ongoing, with increase after loss; Severity of problem:  moderately severe  Patient and/or Family's Strengths/Protective Factors: Concrete supports in place (healthy food, safe environments, etc.) and Sense of purpose  Goals Addressed: Patient will:  Reduce symptoms of: anxiety, depression,  and stress   Demonstrate ability to:  Continue healthy grieving over loss(es)  Progress towards Goals: Ongoing  Interventions: Interventions utilized:  ACT (Acceptance and Commitment Therapy) Standardized Assessments completed: Not Needed  Patient and/or Family Response: Pt agrees with treatment plan  Assessment: Patient currently experiencing Grief .   Patient may benefit from continued psychoeducation and therapeutic interventions  .  Plan: Follow up with behavioral health clinician on : Two weeks Behavioral recommendations:  -Consider imagining ideal life in 5 years from now. What would that look like? Will discuss further at follow up visit.  Referral(s): [place] (In Clinic)  I discussed the assessment and treatment plan with the patient and/or parent/guardian. They were provided an opportunity to ask questions and all were answered. They agreed with the plan and demonstrated an understanding of the instructions.   They were advised to call back or seek an in-person evaluation if the symptoms worsen or if the condition fails to improve as anticipated.  Krystal [name] Salvatore Shear, LCSW

## 2020-11-06 NOTE — BH Specialist Note (Deleted)
Integrated Behavioral Health via Telemedicine Visit  11/06/2020 TONNA FRONTZ [id number]  Number of [place] visits: *** Session Start time: 9:15***  Session End time: 10:15*** Total time: {IBH Total Time:21014050}  Referring Provider: *** Patient/Family location: Home*** [facility] Provider location: Center for [place] at [facility]  All persons participating in visit: Patient *** and [place] ***  Types of Service: {CHL AMB TYPE OF SERVICE:[phone]}  I connected with Brizeida C Layne and/or Whitney C Burtch's {family members:20773} via  Telephone or Video Enabled Telemedicine Application  (Video is Caregility application) and verified that I am speaking with the correct person using two identifiers. Discussed confidentiality: {YES/NO:21197}  I discussed the limitations of telemedicine and the availability of in person appointments.  Discussed there is a possibility of technology failure and discussed alternative modes of communication if that failure occurs.  I discussed that engaging in this telemedicine visit, they consent to the provision of behavioral healthcare and the services will be billed under their insurance.  Patient and/or legal guardian expressed understanding and consented to Telemedicine visit: {YES/NO:21197}  Presenting Concerns: Patient and/or family reports the following symptoms/concerns: *** Duration of problem: ***; Severity of problem: {Mild/Moderate/Severe:20260}  Patient and/or Family's Strengths/Protective Factors: {CHL AMB BH PROTECTIVE FACTORS:[phone]}  Goals Addressed: Patient will:  Reduce symptoms of: {IBH Symptoms:21014056}   Increase knowledge and/or ability of: {IBH Patient Tools:21014057}   Demonstrate ability to: {IBH Goals:21014053}  Progress towards Goals: {CHL AMB BH PROGRESS TOWARDS GOALS:[id number]}  Interventions: Interventions utilized:  {IBH  Interventions:21014054} Standardized Assessments completed: {IBH Screening Tools:21014051}  Patient and/or Family Response: ***  Assessment: Patient currently experiencing ***.   Patient may benefit from ***.  Plan: Follow up with behavioral health clinician on : *** Behavioral recommendations: *** Referral(s): {IBH Referrals:21014055}  I discussed the assessment and treatment plan with the patient and/or parent/guardian. They were provided an opportunity to ask questions and all were answered. They agreed with the plan and demonstrated an understanding of the instructions.   They were advised to call back or seek an in-person evaluation if the symptoms worsen or if the condition fails to improve as anticipated.  Caroleen Hamman Britian Jentz, LCSW

## 2020-11-12 ENCOUNTER — Encounter (HOSPITAL_COMMUNITY): Payer: Self-pay | Admitting: Emergency Medicine

## 2020-11-12 ENCOUNTER — Other Ambulatory Visit: Payer: Self-pay

## 2020-11-12 ENCOUNTER — Ambulatory Visit (HOSPITAL_COMMUNITY)
Admission: EM | Admit: 2020-11-12 | Discharge: 2020-11-12 | Disposition: A | Discharge status: Home / Self Care | Payer: 59 | Attending: Emergency Medicine | Admitting: Emergency Medicine

## 2020-11-12 DIAGNOSIS — R220 Localized swelling, mass and lump, head: Secondary | ICD-10-CM | POA: Diagnosis not present

## 2020-11-12 MED ORDER — METHYLPREDNISOLONE SODIUM SUCC 125 MG IJ SOLR
INTRAMUSCULAR | Status: AC
  Filled 2020-11-12: qty 2

## 2020-11-12 MED ORDER — PREDNISONE 10 MG (21) PO TBPK: ORAL_TABLET | Freq: Every day | ORAL | 0 refills | Status: DC

## 2020-11-12 MED ORDER — METHYLPREDNISOLONE SODIUM SUCC 125 MG IJ SOLR
60.0000 mg | Freq: Once | INTRAMUSCULAR | Status: AC
  Administered 2020-11-12: 60 mg via INTRAMUSCULAR

## 2020-11-12 NOTE — ED Provider Notes (Signed)
[place]    CSN: [id number] Arrival date & time: 11/12/20  0944      History   Chief Complaint Chief Complaint  Patient presents with   Allergic Reaction    HPI Krystal [name] is a 30 y.o. female.   Patient presents with upper lip swelling, itchy throat, shortness of breath for 1 day.  Initially had bilateral periorbital swelling, diffuse flesh tone papular rash over groin and abdomen which has now resolved.  All symptoms began upon awakening.  Denies fever, chills, changes in soaps, lotions, fragrances, detergents, linens, recent travel, changes in diet.  Similar symptoms occurred 2 years ago spontaneously during the same time of year.  Was already using Claritin on a daily for seasonal allergies.  Attempted use of Benadryl with no relief.  History of asthma.  Denies wheezing, coughing, chest tightness or pain with, difficulty swallowing, sore throat, difficulty breathing.  Past Medical History:  Diagnosis Date   Anxiety    Asthma    HELLP (hemolytic anemia/elev liver enzymes/low platelets in pregnancy) 12/08/2017   Hypertension    IC (interstitial cystitis)    Kidney stones    Migraines    Severe uncontrolled hypertension 10/08/2017   Formatting of this note might be different from the original. 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   Posttraumatic stress disorder 05/11/2020   Primary insomnia  05/11/2020   Essential hypertension 05/11/2020   Acute acalculous cholecystitis s/p lap cholecystectomy 05/11/2020 05/11/2020   Intractable right upper quadrant abdominal pain 05/08/2020   Hypokalemia 05/08/2020   Hypertensive urgency 05/08/2020   Postpartum depression 11/30/2019   Hypothyroidism due to Hashimoto's thyroiditis 10/03/2019   Anxiety 06/16/2019   Asthma 06/07/2019   History of uterine scar from previous surgery 06/07/2019   Obesity 06/07/2019   History of HELLP syndrome, currently pregnant 04/21/2019   Depression during pregnancy 07/20/2018   Lumbar spondylosis 06/23/2018   Sacroiliac inflammation ([place]) 06/23/2018   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   History of upper limb amputation, wrist, 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   Nexplanon insertion 01/19/2018    Past Surgical History:  Procedure Laterality Date   APPENDECTOMY     BIOPSY  05/10/2020   Procedure: BIOPSY;  Surgeon: Arta Silence, MD;  Location: [facility];  Service: Endoscopy;;   CESAREAN SECTION N/A 12/06/2017   Procedure: CESAREAN SECTION;  Surgeon: Sloan Leiter, MD;  Location: [facility];  Service: Obstetrics;  Laterality: N/A;   CHOLECYSTECTOMY N/A 05/11/2020   Procedure: SINGLE SITE LAPAROSCOPIC CHOLECYSTECTOMY AND LIVER BIOSY;  Surgeon: Michael Boston, MD;  Location: [facility];  Service: General;  Laterality: N/A;   ESOPHAGOGASTRODUODENOSCOPY (EGD) WITH PROPOFOL N/A 05/10/2020   Procedure: ESOPHAGOGASTRODUODENOSCOPY (EGD) WITH PROPOFOL;  Surgeon: Arta Silence, MD;  Location: [facility];  Service: Endoscopy;  Laterality: N/A;   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 Authorizing Provider  predniSONE (STERAPRED UNI-PAK 21 TAB) 10 MG (21) TBPK tablet Take by mouth daily. Take 6 tabs by mouth daily  for 2 days, then 5 tabs for 2 days, then 4 tabs for 2 days, then 3 tabs for 2 days, 2 tabs for 2 days, then 1 tab by mouth daily for 2 days 11/12/20  Yes Sha Amer R, NP  acetaminophen (TYLENOL) 500 MG tablet Take 2 tablets (1,000 mg total) by mouth every 6 (six) hours as needed. 05/14/20   Bonnielee Haff, MD  albuterol (PROVENTIL HFA;VENTOLIN HFA) 108 (90 Base) MCG/ACT inhaler Inhale 1 puff into the lungs every 6 (six) hours as needed for wheezing or shortness of breath.    [provider]  ALPRAZolam Duanne Moron) 1 MG tablet Take 0.5-1 mg by mouth 2 (two) times daily as needed. 08/31/20   [provider]  amLODipine (NORVASC) 10 MG tablet Take 1 tablet (10 mg total) by mouth daily. 05/14/20   Bonnielee Haff, MD  amphetamine-dextroamphetamine (ADDERALL) 10 MG tablet Take 5-10 mg by mouth 2 (two) times daily. 09/07/20   [provider]  cetirizine (ZYRTEC) 10 MG tablet Take 10 mg by mouth daily.    [provider]  etonogestrel-ethinyl estradiol (NUVARING) 0.12-0.015 MG/24HR vaginal ring Insert vaginally and leave in place for 3 consecutive weeks, then remove for 1 week. 10/09/20   Renard Matter, MD  hydrALAZINE (APRESOLINE) 100 MG tablet Take 1 tablet (100 mg total) by mouth 3 (three) times daily. 05/14/20   Bonnielee Haff, MD  labetalol (NORMODYNE) 300 MG tablet Take 2 tablets (600 mg total) by mouth 2 (two) times daily. 08/24/20   Renee Harder, CNM  levothyroxine (SYNTHROID) 112 MCG tablet Take 112 mcg by mouth daily before breakfast. 02/03/20   [provider]  pantoprazole (PROTONIX) 40 MG tablet Take 1 tablet twice daily for 2 weeks and then once daily as before 05/14/20   Bonnielee Haff, MD  PAXLOVID 20 x 150 MG & 10 x '100MG'$  TBPK Take by mouth. 08/21/20   [provider]   polyethylene glycol powder (GLYCOLAX/MIRALAX) 17 GM/SCOOP powder Take 17 g by mouth daily. Patient not taking: Reported on 10/09/2020 05/14/20   Bonnielee Haff, MD  Prenatal Vit-Fe Fumarate-FA (PRENATAL PO) Take 1 tablet by mouth daily.    [provider]  sucralfate (CARAFATE) 1 GM/10ML suspension Take 10 mLs (1 g total) by mouth 4 (four) times daily -  with meals and at bedtime. Patient not taking: Reported on 10/09/2020 05/14/20   Bonnielee Haff, MD  venlafaxine XR (EFFEXOR-XR) 150 MG 24 hr capsule Take 150 mg by mouth daily. 09/16/20   [provider]    Family History Family History  Problem Relation Age of Onset   Hypertension Mother    Hypertension Father     Social History Social History   Tobacco Use   Smoking status: Never   Smokeless tobacco: Never   Tobacco comments:  She tried a cigarette and never smoked again  Vaping Use   Vaping Use: Never used  Substance Use Topics   Alcohol use: Yes    Comment: bottle of wine a week-occ   Drug use: No     Allergies   Red dye, Compazine [prochlorperazine maleate], Other, Vicodin [hydrocodone-acetaminophen], Wheat bran, Amitriptyline hcl, Duloxetine hcl, Reglan [metoclopramide], and Nifedipine   Review of Systems Review of Systems  Constitutional: Negative.   HENT: Negative.    Respiratory:  Positive for shortness of breath. Negative for apnea, cough, chest tightness, wheezing and stridor.   Cardiovascular: Negative.     Physical Exam Triage Vital Signs ED Triage Vitals [11/12/20 0949]  Enc Vitals Group     BP (!) 185/125     Pulse Rate 71     Resp (!) 24     Temp 98.4 F (36.9 C)     Temp Source Oral     SpO2 98 %     Weight      Height      Head Circumference      Peak Flow      Pain Score 0     Pain Loc      Pain Edu?      Excl. in [place]?    No data found.  Updated Vital Signs BP (!) 185/125 (BP Location: Right Arm)   Pulse 71   Temp 98.4 F (36.9 C) (Oral)   Resp (!) 24   SpO2  98%   Visual Acuity Right Eye Distance:   Left Eye Distance:   Bilateral Distance:    Right Eye Near:   Left Eye Near:    Bilateral Near:     Physical Exam Constitutional:      Appearance: Normal appearance. She is normal weight.  HENT:     Head: Normocephalic.     Nose: Congestion present. No rhinorrhea.     Mouth/Throat:     Mouth: Mucous membranes are moist.     Pharynx: Oropharynx is clear. No oropharyngeal exudate or posterior oropharyngeal erythema.  Eyes:     Extraocular Movements: Extraocular movements intact.  Cardiovascular:     Rate and Rhythm: Normal rate and regular rhythm.     Pulses: Normal pulses.     Heart sounds: Normal heart sounds.  Pulmonary:     Effort: Tachypnea present.     Breath sounds: Normal breath sounds.  Musculoskeletal:     Cervical back: Normal range of motion and neck supple.  Skin:    General: Skin is warm and dry.  Neurological:     General: No focal deficit present.     Mental Status: She is alert. She is disoriented.  Psychiatric:        Mood and Affect: Mood normal.        Behavior: Behavior normal.     UC Treatments / Results  Labs (all labs ordered are listed, but only abnormal results are displayed) Labs Reviewed - No data to display  EKG   Radiology No results found.  Procedures Procedures (including critical care time)  Medications Ordered in UC Medications  methylPREDNISolone sodium succinate (SOLU-MEDROL) 125 mg/2 mL injection 60 mg (has no administration in time range)    Initial Impression / Assessment and Plan / UC Course  I have reviewed the triage vital signs and the nursing notes.  Pertinent labs & imaging results that were available during my care of the patient were reviewed by me and considered in my  medical decision making (see chart for details).  Upper lip  1.  Methylprednisolone 60 mg IM now 2.  Prednisone 60 mg taper, starting tomorrow morning 3.  Advised continued use of Claritin daily  and use of Benadryl as needed 4.  Advised use of patient's albuterol inhaler 2 puffs every 4-6 hours to assist with shortness of breath 5.  Given strict return precautions to go to nearest emergency department with any signs of difficulty breathing worsening shortness of breath Final Clinical Impressions(s) / UC Diagnoses   Final diagnoses:  Swelling of upper lip     Discharge Instructions      Take prednisone every morning with food as directed per pack  At any point if your breathing worsens, you begin to have difficulty swallowing, you feel as if you cannot take a deep breath please go to the nearest emergency department for evaluation  Continue use of Claritin daily for seasonal allergies  Can use your albuterol inhaler 2 puffs every 4-6 hours as needed to help with shortness of breath  Can use 25 to 50 mg of Benadryl as needed to further help with symptoms   ED Prescriptions     Medication Sig Dispense Auth. Provider   predniSONE (STERAPRED UNI-PAK 21 TAB) 10 MG (21) TBPK tablet Take by mouth daily. Take 6 tabs by mouth daily  for 2 days, then 5 tabs for 2 days, then 4 tabs for 2 days, then 3 tabs for 2 days, 2 tabs for 2 days, then 1 tab by mouth daily for 2 days 42 tablet Maebell Lyvers, Leitha Schuller, NP      PDMP not reviewed this encounter.   Hans Eden, NP 11/12/20 1006

## 2020-11-12 NOTE — Discharge Instructions (Addendum)
Take prednisone every morning with food as directed per pack  At any point if your breathing worsens, you begin to have difficulty swallowing, you feel as if you cannot take a deep breath please go to the nearest emergency department for evaluation  Continue use of Claritin daily for seasonal allergies  Can use your albuterol inhaler 2 puffs every 4-6 hours as needed to help with shortness of breath  Can use 25 to 50 mg of Benadryl as needed to further help with symptoms

## 2020-11-12 NOTE — ED Triage Notes (Signed)
Pt woke up with upper swollen lip, SOB, throat tingling. Took Benadryl '25mg'$  around 9pm last night and today only taken Claritin.  Yesterday started with rash on torso and groin area and swollen eyes.

## 2020-11-13 ENCOUNTER — Ambulatory Visit (INDEPENDENT_AMBULATORY_CARE_PROVIDER_SITE_OTHER): Payer: 59 | Admitting: Clinical

## 2020-11-13 DIAGNOSIS — F430 Acute stress reaction: Secondary | ICD-10-CM | POA: Diagnosis not present

## 2020-11-13 DIAGNOSIS — F4321 Adjustment disorder with depressed mood: Secondary | ICD-10-CM

## 2020-11-13 NOTE — BH Specialist Note (Signed)
Integrated Behavioral Health via Telemedicine Visit  11/13/2020 Krystal [name] [id number]  Number of [place] visits: 3 Session Start time: 1:45  Session End time: 2:15 Total time: 30  Referring Provider: Renard Matter, MD Patient/Family location: Home [facility] Provider location: [facility]  All persons participating in visit: [name] and Krystal [name]   Types of Service: Individual psychotherapy and Video visit  I connected with Krystal [name] and/or Krystal [name]'s  n/a  via  Telephone or Video Enabled Telemedicine Application  (Video is Care[employer] application) and verified that I am speaking with the correct person using two identifiers. Discussed confidentiality: Yes   I discussed the limitations of telemedicine and the availability of in person appointments.  Discussed there is a possibility of technology failure and discussed alternative modes of communication if that failure occurs.  I discussed that engaging in this telemedicine visit, they consent to the provision of behavioral healthcare and the services will be billed under their insurance.  Patient and/or legal guardian expressed understanding and consented to Telemedicine visit: Yes   Presenting Concerns: Patient and/or family reports the following symptoms/concerns: "Snapped and shaved my head", triggered by husband wanting to get rid of cat (due to possible allergic reaction leading to ED visit; unknown allergen); the thought of losing cat triggered childhood trauma of pets "disappearing" and found buried later. Pt feels isolated and feels her feelings are not taken into consideration in marriage; wants marriage to improve. Duration of problem: Ongoing; Severity of problem:  moderately severe  Patient and/or Family's Strengths/Protective Factors: Concrete supports in place (healthy food, safe environments, etc.) and  Sense of purpose  Goals Addressed: Patient will:  Reduce symptoms of: anxiety, depression, mood instability, and stress   Increase knowledge and/or ability of: coping skills   Demonstrate ability to: Increase motivation to adhere to plan of care  Progress towards Goals: Ongoing  Interventions: Interventions utilized:  Communication Skills Standardized Assessments completed: Not Needed  Patient and/or Family Response: Pt agrees with treatment plan   Assessment: Patient currently experiencing Acute stress reaction.   Patient may benefit from continued psychoeducation and brief therapeutic interventions regarding coping with symptoms of mood instability, depression, anxiety, stress .  Plan: Follow up with behavioral health clinician on : One week Behavioral recommendations:  -Go to [employer]/insurance website and see if any marriage counselors taking new clients -When hurtful words are spoken, imagine wearing a cloak that repels them (arrows falling to the ground; not piercing through). The words are not about you, they are about the person who is aiming them at you.  -Consider writing down three things you most need from your husband.  -Consider asking husband "what are three things you need most from me". Pick at least one to give, without expecting anything back at this time.  Referral(s): [place] (In Clinic)  I discussed the assessment and treatment plan with the patient and/or parent/guardian. They were provided an opportunity to ask questions and all were answered. They agreed with the plan and demonstrated an understanding of the instructions.   They were advised to call back or seek an in-person evaluation if the symptoms worsen or if the condition fails to improve as anticipated.  Garlan Fair, LCSW  Depression screen [facility] 2/9 10/09/2020 09/26/2020 12/22/2017  Decreased Interest 3 2 1   Down, Depressed, Hopeless 2 3 1   PHQ - 2 Score 5 5  2   Altered  sleeping 3 3 2   Tired, decreased energy 2 2 2   Change in appetite 3 3 0  Feeling bad or failure about yourself  2 2 2   Trouble concentrating 3 2 0  Moving slowly or fidgety/restless 1 3 0  Suicidal thoughts 0 0 0  PHQ-9 Score 19 20 8    GAD 7 : Generalized Anxiety Score 10/09/2020 09/26/2020 12/22/2017  Nervous, Anxious, on Edge 3 3 1   Control/stop worrying 2 3 0  Worry too much - different things 3 3 0  Trouble relaxing 3 3 0  Restless 1 2 0  Easily annoyed or irritable 3 3 0  Afraid - awful might happen 2 3 2   Total GAD 7 Score 17 20 3

## 2020-11-18 ENCOUNTER — Other Ambulatory Visit: Payer: Self-pay

## 2020-11-18 ENCOUNTER — Encounter (HOSPITAL_COMMUNITY): Payer: Self-pay | Admitting: Emergency Medicine

## 2020-11-18 ENCOUNTER — Emergency Department (HOSPITAL_COMMUNITY)
Admission: EM | Admit: 2020-11-18 | Discharge: 2020-11-18 | Disposition: A | Discharge status: Other Acute Inpatient Hospital | Payer: 59 | Attending: Emergency Medicine | Admitting: Emergency Medicine

## 2020-11-18 ENCOUNTER — Other Ambulatory Visit (HOSPITAL_COMMUNITY)
Admission: EM | Admit: 2020-11-18 | Discharge: 2020-11-20 | Disposition: A | Discharge status: Home / Self Care | Payer: 59 | Attending: Psychiatry | Admitting: Psychiatry

## 2020-11-18 DIAGNOSIS — J45909 Unspecified asthma, uncomplicated: Secondary | ICD-10-CM | POA: Insufficient documentation

## 2020-11-18 DIAGNOSIS — Z89111 Acquired absence of right hand: Secondary | ICD-10-CM | POA: Insufficient documentation

## 2020-11-18 DIAGNOSIS — F332 Major depressive disorder, recurrent severe without psychotic features: Secondary | ICD-10-CM | POA: Insufficient documentation

## 2020-11-18 DIAGNOSIS — R519 Headache, unspecified: Secondary | ICD-10-CM | POA: Diagnosis not present

## 2020-11-18 DIAGNOSIS — F32A Depression, unspecified: Secondary | ICD-10-CM | POA: Diagnosis present

## 2020-11-18 DIAGNOSIS — R45851 Suicidal ideations: Secondary | ICD-10-CM | POA: Insufficient documentation

## 2020-11-18 DIAGNOSIS — E039 Hypothyroidism, unspecified: Secondary | ICD-10-CM | POA: Insufficient documentation

## 2020-11-18 DIAGNOSIS — Y92009 Unspecified place in unspecified non-institutional (private) residence as the place of occurrence of the external cause: Secondary | ICD-10-CM | POA: Insufficient documentation

## 2020-11-18 DIAGNOSIS — I10 Essential (primary) hypertension: Secondary | ICD-10-CM | POA: Insufficient documentation

## 2020-11-18 DIAGNOSIS — R110 Nausea: Secondary | ICD-10-CM | POA: Insufficient documentation

## 2020-11-18 DIAGNOSIS — W01198A Fall on same level from slipping, tripping and stumbling with subsequent striking against other object, initial encounter: Secondary | ICD-10-CM | POA: Diagnosis not present

## 2020-11-18 DIAGNOSIS — Z79899 Other long term (current) drug therapy: Secondary | ICD-10-CM | POA: Insufficient documentation

## 2020-11-18 DIAGNOSIS — F4310 Post-traumatic stress disorder, unspecified: Secondary | ICD-10-CM | POA: Diagnosis present

## 2020-11-18 DIAGNOSIS — Z20822 Contact with and (suspected) exposure to covid-19: Secondary | ICD-10-CM | POA: Diagnosis not present

## 2020-11-18 DIAGNOSIS — R55 Syncope and collapse: Secondary | ICD-10-CM | POA: Insufficient documentation

## 2020-11-18 DIAGNOSIS — N9489 Other specified conditions associated with female genital organs and menstrual cycle: Secondary | ICD-10-CM | POA: Insufficient documentation

## 2020-11-18 LAB — RAPID URINE DRUG SCREEN, HOSP PERFORMED
Amphetamines: NOT DETECTED
Barbiturates: NOT DETECTED
Benzodiazepines: POSITIVE — AB
Cocaine: NOT DETECTED
Opiates: NOT DETECTED
Tetrahydrocannabinol: POSITIVE — AB

## 2020-11-18 LAB — COMPREHENSIVE METABOLIC PANEL
ALT: 25 U/L (ref 0–44)
AST: 21 U/L (ref 15–41)
Albumin: 4.6 g/dL (ref 3.5–5.0)
Alkaline Phosphatase: 59 U/L (ref 38–126)
Anion gap: 7 (ref 5–15)
BUN: 13 mg/dL (ref 6–20)
CO2: 27 mmol/L (ref 22–32)
Calcium: 9.5 mg/dL (ref 8.9–10.3)
Chloride: 106 mmol/L (ref 98–111)
Creatinine, Ser: 0.77 mg/dL (ref 0.44–1.00)
GFR, Estimated: >60 mL/min (ref >60)
Glucose, Bld: 94 mg/dL (ref 70–99)
Potassium: 3 mmol/L — ABNORMAL LOW (ref 3.5–5.1)
Sodium: 140 mmol/L (ref 135–145)
Total Bilirubin: 0.6 mg/dL (ref 0.3–1.2)
Total Protein: 7.6 g/dL (ref 6.5–8.1)

## 2020-11-18 LAB — URINALYSIS, ROUTINE W REFLEX MICROSCOPIC
Bilirubin Urine: NEGATIVE
Glucose, UA: NEGATIVE mg/dL
Ketones, ur: NEGATIVE mg/dL
Leukocytes,Ua: NEGATIVE
Nitrite: NEGATIVE
Protein, ur: NEGATIVE mg/dL
Specific Gravity, Urine: 1.02 (ref 1.005–1.030)
pH: 7 (ref 5.0–8.0)

## 2020-11-18 LAB — SALICYLATE LEVEL: Salicylate Lvl: <7 mg/dL — ABNORMAL LOW (ref 7.0–30.0)

## 2020-11-18 LAB — CBC
HCT: 45.5 % (ref 36.0–46.0)
Hemoglobin: 15.9 g/dL — ABNORMAL HIGH (ref 12.0–15.0)
MCH: 32 pg (ref 26.0–34.0)
MCHC: 34.9 g/dL (ref 30.0–36.0)
MCV: 91.5 fL (ref 80.0–100.0)
Platelets: 356 10*3/uL (ref 150–400)
RBC: 4.97 MIL/uL (ref 3.87–5.11)
RDW: 12.6 % (ref 11.5–15.5)
WBC: 9.2 10*3/uL (ref 4.0–10.5)
nRBC: 0 % (ref 0.0–0.2)

## 2020-11-18 LAB — I-STAT BETA HCG BLOOD, ED (MC, WL, AP ONLY): I-stat hCG, quantitative: <5 m[IU]/mL (ref <5)

## 2020-11-18 LAB — RESP PANEL BY RT-PCR (FLU A&B, COVID) ARPGX2
Influenza A by PCR: NEGATIVE
Influenza B by PCR: NEGATIVE
SARS Coronavirus 2 by RT PCR: NEGATIVE

## 2020-11-18 LAB — ETHANOL: Alcohol, Ethyl (B): <10 mg/dL (ref <10)

## 2020-11-18 LAB — ACETAMINOPHEN LEVEL: Acetaminophen (Tylenol), Serum: <10 ug/mL — ABNORMAL LOW (ref 10–30)

## 2020-11-18 LAB — CBG MONITORING, ED: Glucose-Capillary: 100 mg/dL — ABNORMAL HIGH (ref 70–99)

## 2020-11-18 MED ORDER — LABETALOL HCL 200 MG PO TABS
200.0000 mg | ORAL_TABLET | Freq: Once | ORAL | Status: AC
  Administered 2020-11-18: 200 mg via ORAL
  Filled 2020-11-18: qty 1

## 2020-11-18 MED ORDER — KETOROLAC TROMETHAMINE 30 MG/ML IJ SOLN
30.0000 mg | Freq: Once | INTRAMUSCULAR | Status: AC
  Administered 2020-11-18: 30 mg via INTRAVENOUS
  Filled 2020-11-18: qty 1

## 2020-11-18 MED ORDER — LORAZEPAM 1 MG PO TABS
2.0000 mg | ORAL_TABLET | Freq: Once | ORAL | Status: AC
  Administered 2020-11-18: 2 mg via ORAL
  Filled 2020-11-18: qty 2

## 2020-11-18 NOTE — ED Provider Notes (Signed)
[facility] DEPT Provider Note   CSN: [id number] Arrival date & time: 11/18/20  1550     History Chief Complaint  Patient presents with   Loss of Consciousness    Krystal [name] is a 30 y.o. female.  Krystal [name] presents to the emergency department after 3 syncopal episodes.  She is also endorsing significant depression with passive suicidal ideation.  She is also experiencing severe anxiety.  The episodes occurred during an argument with her husband.  She states that she feels that her marriage is over.  While she was attempting to go and get her medication in order to treat the anxiety that was provoked by this argument, she had a syncopal episode.  This was followed closely by 2 further episodes.  She hit her head on the hardwood floor during one of them.  She has had similar episodes in the past.  She does state that she would like to have mental health evaluation.  She has a long history of trauma, and she had a suicide attempt as a teenager.  She states that this is the first time since that time that she has felt as if she wanted to die.  She is in counseling after suffering a miscarriage roughly 3 months ago.  The history is provided by the patient.  Loss of Consciousness Episode history:  Multiple Most recent episode:  Today Duration: several minutes a piece. Timing:  Intermittent Progression:  Resolved Chronicity:  Recurrent Context comment:  She and her husband had a fight, and she has had syncope in the past when stressed. Witnessed: yes   Relieved by:  Nothing Worsened by:  Nothing Ineffective treatments:  None tried Associated symptoms: anxiety, headaches and nausea   Associated symptoms: no chest pain, no confusion, no diaphoresis, no difficulty breathing, no fever, no focal sensory loss, no focal weakness, no palpitations, no seizures, no shortness of breath, no visual change and no vomiting       Past Medical History:   Diagnosis Date   Anxiety    Asthma    HELLP (hemolytic anemia/elev liver enzymes/low platelets in pregnancy) 12/08/2017   Hypertension    IC (interstitial cystitis)    Kidney stones    Migraines    Severe uncontrolled hypertension 10/08/2017   Formatting of this note might be different from the original. 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   Posttraumatic stress disorder 05/11/2020   Primary insomnia 05/11/2020   Essential hypertension 05/11/2020   Acute acalculous cholecystitis s/p lap cholecystectomy 05/11/2020 05/11/2020   Intractable right upper quadrant abdominal pain 05/08/2020   Hypokalemia 05/08/2020   Hypertensive urgency 05/08/2020   Postpartum depression 11/30/2019   Hypothyroidism due to Hashimoto's thyroiditis 10/03/2019   Anxiety 06/16/2019   Asthma 06/07/2019   History of uterine scar from previous surgery 06/07/2019   Obesity 06/07/2019   History of HELLP syndrome, currently pregnant 04/21/2019   Depression during pregnancy 07/20/2018   Lumbar spondylosis 06/23/2018  Sacroiliac inflammation ([place]) 06/23/2018   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   History of upper limb amputation, wrist, 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   Nexplanon insertion 01/19/2018    Past Surgical History:  Procedure Laterality Date   APPENDECTOMY     BIOPSY  05/10/2020   Procedure: BIOPSY;  Surgeon: Arta Silence, MD;  Location: [facility];  Service: Endoscopy;;   CESAREAN SECTION N/A 12/06/2017   Procedure: CESAREAN SECTION;  Surgeon: Sloan Leiter, MD;  Location: [facility];  Service: Obstetrics;  Laterality: N/A;   CHOLECYSTECTOMY N/A 05/11/2020   Procedure: SINGLE SITE LAPAROSCOPIC CHOLECYSTECTOMY AND LIVER BIOSY;  Surgeon: Michael Boston, MD;  Location: [facility];  Service: General;  Laterality: N/A;   ESOPHAGOGASTRODUODENOSCOPY (EGD) WITH PROPOFOL N/A 05/10/2020   Procedure: ESOPHAGOGASTRODUODENOSCOPY (EGD) WITH PROPOFOL;  Surgeon: Arta Silence, MD;  Location: [facility];  Service: Endoscopy;  Laterality: N/A;   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   HAND RECONSTRUCTION Right    INCISION AND DRAINAGE     NASAL SEPTUM SURGERY       OB History     Gravida  3   Para  2   Term  0   Preterm  2   AB  0   Living  2      SAB  0   IAB  0   Ectopic  0   Multiple  0   Live Births  2           Family History  Problem Relation Age of Onset   Hypertension Mother    Hypertension Father     Social History   Tobacco Use   Smoking status: Never   Smokeless tobacco: Never   Tobacco comments:    She tried a cigarette and never smoked again  Vaping Use   Vaping Use: Never used  Substance Use Topics   Alcohol use: Yes    Comment: bottle of wine a week-occ   Drug use: No    Home Medications Prior to Admission medications   Medication Sig Start Date End Date Taking? Authorizing Provider  acetaminophen (TYLENOL) 500 MG tablet Take 2 tablets (1,000 mg total) by mouth every 6 (six) hours as needed. 05/14/20   Bonnielee Haff, MD  albuterol (PROVENTIL HFA;VENTOLIN HFA) 108 (90 Base) MCG/ACT inhaler Inhale 1 puff into the lungs every 6 (six) hours as needed for wheezing or shortness of breath.    [provider]  ALPRAZolam Duanne Moron) 1 MG tablet Take 0.5-1 mg by mouth 2 (two) times daily as needed. 08/31/20   [provider]  amLODipine (NORVASC) 10 MG  tablet Take 1 tablet (10 mg total) by mouth daily. 05/14/20   Bonnielee Haff, MD  amphetamine-dextroamphetamine (ADDERALL) 10 MG tablet Take 5-10 mg by mouth 2 (two) times daily. 09/07/20   [provider]  cetirizine (ZYRTEC) 10 MG tablet Take 10 mg by mouth daily.    [provider]  etonogestrel-ethinyl estradiol (NUVARING) 0.12-0.015 MG/24HR vaginal ring Insert vaginally and leave in place for 3 consecutive weeks, then remove for 1 week. 10/09/20   Renard Matter, MD  hydrALAZINE (APRESOLINE) 100 MG tablet Take 1 tablet (100 mg total) by mouth 3 (three) times daily. 05/14/20   Bonnielee Haff, MD  labetalol (NORMODYNE) 300 MG tablet Take 2 tablets (600 mg total) by mouth 2 (two) times daily. 08/24/20   Renee Harder, CNM  levothyroxine (SYNTHROID) 112 MCG tablet Take 112 mcg by mouth daily before  breakfast. 02/03/20   [provider]  pantoprazole (PROTONIX) 40 MG tablet Take 1 tablet twice daily for 2 weeks and then once daily as before 05/14/20   Bonnielee Haff, MD  PAXLOVID 20 x 150 MG & 10 x 100MG  TBPK Take by mouth. 08/21/20   [provider]  polyethylene glycol powder (GLYCOLAX/MIRALAX) 17 GM/SCOOP powder Take 17 g by mouth daily. Patient not taking: Reported on 10/09/2020 05/14/20   Bonnielee Haff, MD  predniSONE (STERAPRED UNI-PAK 21 TAB) 10 MG (21) TBPK tablet Take by mouth daily. Take 6 tabs by mouth daily  for 2 days, then 5 tabs for 2 days, then 4 tabs for 2 days, then 3 tabs for 2 days, 2 tabs for 2 days, then 1 tab by mouth daily for 2 days 11/12/20   Hans Eden, NP  Prenatal Vit-Fe Fumarate-FA (PRENATAL PO) Take 1 tablet by mouth daily.    [provider]  sucralfate (CARAFATE) 1 GM/10ML suspension Take 10 mLs (1 g total) by mouth 4 (four) times daily -  with meals and at bedtime. Patient not taking: Reported on 10/09/2020 05/14/20   Bonnielee Haff, MD  venlafaxine XR (EFFEXOR-XR) 150 MG 24 hr capsule Take 150 mg by mouth daily. 09/16/20    [provider]    Allergies    Red dye, Compazine [prochlorperazine maleate], Other, Vicodin [hydrocodone-acetaminophen], Wheat bran, Amitriptyline hcl, Duloxetine hcl, Reglan [metoclopramide], and Nifedipine  Review of Systems   Review of Systems  Constitutional:  Negative for chills, diaphoresis and fever.  HENT:  Negative for ear pain and sore throat.   Eyes:  Negative for pain and visual disturbance.  Respiratory:  Negative for cough and shortness of breath.   Cardiovascular:  Positive for syncope. Negative for chest pain and palpitations.  Gastrointestinal:  Positive for nausea. Negative for abdominal pain and vomiting.  Genitourinary:  Negative for dysuria and hematuria.  Musculoskeletal:  Negative for arthralgias and back pain.  Skin:  Negative for color change and rash.  Neurological:  Positive for headaches. Negative for focal weakness, seizures and syncope.  Psychiatric/Behavioral:  Positive for dysphoric mood and suicidal ideas. Negative for confusion. The patient is nervous/anxious.   All other systems reviewed and are negative.  Physical Exam Updated Vital Signs BP (!) 188/114 (BP Location: Right Arm)   Pulse (!) 116   Temp 98.1 F (36.7 C) (Oral)   Resp (!) 23   SpO2 99%   Breastfeeding No   Physical Exam Vitals and nursing note reviewed.  Constitutional:      Comments: Crying, appears despondent  HENT:     Head: Normocephalic and atraumatic.  Cardiovascular:     Rate and Rhythm: Normal rate and regular rhythm.     Heart sounds: Normal heart sounds.  Pulmonary:     Effort: Pulmonary effort is normal. No tachypnea.     Breath sounds: Normal breath sounds.  Abdominal:     Palpations: Abdomen is soft.     Tenderness: There is no abdominal tenderness.  Musculoskeletal:     Right lower leg: No edema.     Left lower leg: No edema.     Comments: She has extensive scarring on the dorsum of the right hand and forearm secondary to IV infiltration with  Phenergan years ago while in [place].  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  He also has an amputation of her left wrist secondary to the severe skin infection.  Skin:    General: Skin  is warm and dry.  Neurological:     General: No focal deficit present.     Mental Status: She is alert and oriented to person, place, and time.  Psychiatric:        Attention and Perception: Attention normal.        Mood and Affect: Affect is tearful.        Speech: Speech normal.        Behavior: Behavior is cooperative.        Thought Content: Thought content includes suicidal ideation. Thought content does not include suicidal plan.        Cognition and Memory: Cognition and memory normal.        Judgment: Judgment normal.    ED Results / Procedures / Treatments   Labs (all labs ordered are listed, but only abnormal results are displayed) Labs Reviewed  CBC - Abnormal; Notable for the following components:      Result Value   Hemoglobin 15.9 (*)    All other components within normal limits  SALICYLATE LEVEL - Abnormal; Notable for the following components:   Salicylate Lvl <4.1 (*)    All other components within normal limits  ACETAMINOPHEN LEVEL - Abnormal; Notable for the following components:   Acetaminophen (Tylenol), Serum <10 (*)    All other components within normal limits  COMPREHENSIVE METABOLIC PANEL - Abnormal; Notable for the following components:   Potassium 3.0 (*)    All other components within normal limits  CBG MONITORING, ED - Abnormal; Notable for the following components:   Glucose-Capillary 100 (*)    All other components within normal limits  ETHANOL  URINALYSIS, ROUTINE W REFLEX MICROSCOPIC  RAPID URINE DRUG SCREEN, HOSP PERFORMED  I-STAT BETA HCG BLOOD, ED ([facility], [facility], [facility] ONLY)    EKG EKG Interpretation  Date/Time:  Sunday November 18 2020 16:04:46 EDT Ventricular Rate:  96 PR Interval:  138 QRS Duration: 106 QT Interval:  345 QTC  Calculation: 436 R Axis:   65 Text Interpretation: Sinus rhythm Right atrial enlargement LVH with secondary repolarization abnormality QTc normal Confirmed by Lorre Munroe ([id number]) on 11/18/2020 4:43:46 PM  Radiology No results found.  Procedures Procedures   Medications Ordered in ED Medications  labetalol (NORMODYNE) tablet 200 mg (has no administration in time range)  LORazepam (ATIVAN) tablet 2 mg (has no administration in time range)    ED Course  I have reviewed the triage vital signs and the nursing notes.  Pertinent labs & imaging results that were available during my care of the patient were reviewed by me and considered in my medical decision making (see chart for details).    MDM Rules/Calculators/A&P                           Krystal [name] was evaluated after multiple syncopal episodes today.  CT imaging was not performed because she did not meet criteria for cross-sectional imaging of her head.  Symptoms seem to be attributable to the stressful situation, and her anxiety and depression seem to be at the heart of her presentation.  She was hypertensive which is a longstanding problem.  Otherwise, ED work-up was unremarkable.  She was seen by TTS who recommended facility based crisis center.  Patient agreed to go voluntarily and will be transported by safe transport. Final Clinical Impression(s) / ED Diagnoses Final diagnoses:  Syncope and collapse  Suicidal ideation    Rx / DC Orders ED Discharge  Orders     None        Arnaldo Natal, MD 11/18/20 2104
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
## 2020-11-18 NOTE — ED Provider Notes (Signed)
Emergency Medicine Provider Triage Evaluation Note  Krystal [name] , a 30 y.o. female  was evaluated in triage.  Pt complains of 3 syncopal episodes today.  Patient has been under increased stress in her relationship, was trying to take her prescribed hydroxyzine for anxiety when she lost consciousness and hit her head on a table.  She had 2 syncopal episodes after this, including 1 where she hit her head again as well as her right elbow and her bilateral knees.  Review of Systems  Positive: Head injury, LOC, intermittent SOB, nasal congestion Negative: Numbness, tingling, weakness  Physical Exam  BP (!) 191/147 (BP Location: Right Arm)   Pulse (!) 116   Temp 98.1 F (36.7 C) (Oral)   Resp (!) 23   SpO2 99%   Breastfeeding No  Gen:   Awake, no distress   Resp:  Normal effort  MSK:   Moves extremities without difficulty  Other:  Full ROM of right elbow and bilateral knees, tenderness to palpation of right occiput with no obvious deformity  Medical Decision Making  Medically screening exam initiated at 4:42 PM.  Appropriate orders placed.  Krystal [name] was informed that the remainder of the evaluation will be completed by another provider, this initial triage assessment does not replace that evaluation, and the importance of remaining in the ED until their evaluation is complete.     Estill Cotta 11/18/20 1644    Arnaldo Natal, MD 11/18/20 [phone]

## 2020-11-18 NOTE — ED Notes (Signed)
Patient changed out into burgandy scrubs. 1 patient belongings bag with patients cell phone included in the bag.

## 2020-11-18 NOTE — ED Notes (Signed)
Unsuccessful IV attempt x 1.  Pt has limited access options.  IV team consult placed.  EDP made aware.

## 2020-11-18 NOTE — ED Notes (Signed)
[facility] called to take patient to [facility]. Voluntary papers signed.

## 2020-11-18 NOTE — ED Notes (Signed)
TTS at bedside. 

## 2020-11-18 NOTE — ED Triage Notes (Signed)
Patient here from home reporting 3 syncopal episode today while home with last one falling and hitting back of head and right elbow. Hypertensive in triage.

## 2020-11-18 NOTE — ED Notes (Signed)
Patient ambulated into the [facility] car with [facility]. 1 bag of belongings given to DeeDee and the EMTALA form with recent vitals updated within the last 15 minutes.

## 2020-11-19 DIAGNOSIS — F332 Major depressive disorder, recurrent severe without psychotic features: Secondary | ICD-10-CM | POA: Diagnosis present

## 2020-11-19 DIAGNOSIS — F4310 Post-traumatic stress disorder, unspecified: Secondary | ICD-10-CM | POA: Diagnosis not present

## 2020-11-19 MED ORDER — LEVOTHYROXINE SODIUM 112 MCG PO TABS
112.0000 ug | ORAL_TABLET | Freq: Every day | ORAL | Status: DC
  Administered 2020-11-20: 112 ug via ORAL
  Filled 2020-11-19: qty 1

## 2020-11-19 MED ORDER — HYDROXYZINE HCL 25 MG PO TABS
25.0000 mg | ORAL_TABLET | Freq: Three times a day (TID) | ORAL | Status: DC | PRN
  Administered 2020-11-19: 25 mg via ORAL
  Filled 2020-11-19: qty 1

## 2020-11-19 MED ORDER — CLONIDINE HCL 0.1 MG PO TABS: 0.1000 mg | ORAL_TABLET | Freq: Once | ORAL | Status: DC

## 2020-11-19 MED ORDER — ACETAMINOPHEN 500 MG PO TABS: 1000.0000 mg | ORAL_TABLET | Freq: Four times a day (QID) | ORAL | Status: DC | PRN

## 2020-11-19 MED ORDER — PRAZOSIN HCL 1 MG PO CAPS
1.0000 mg | ORAL_CAPSULE | Freq: Every day | ORAL | Status: DC
  Administered 2020-11-19: 1 mg via ORAL
  Filled 2020-11-19: qty 1

## 2020-11-19 MED ORDER — ACETAMINOPHEN 500 MG PO TABS: 500.0000 mg | ORAL_TABLET | Freq: Once | ORAL | Status: DC

## 2020-11-19 MED ORDER — HYDRALAZINE HCL 50 MG PO TABS
100.0000 mg | ORAL_TABLET | Freq: Three times a day (TID) | ORAL | Status: DC
  Filled 2020-11-19: qty 2

## 2020-11-19 MED ORDER — ALUM & MAG HYDROXIDE-SIMETH 200-200-20 MG/5ML PO SUSP: 30.0000 mL | ORAL | Status: DC | PRN

## 2020-11-19 MED ORDER — HYDRALAZINE HCL 50 MG PO TABS
ORAL_TABLET | ORAL | Status: AC
  Filled 2020-11-19: qty 2

## 2020-11-19 MED ORDER — ACETAMINOPHEN 325 MG PO TABS
650.0000 mg | ORAL_TABLET | Freq: Four times a day (QID) | ORAL | Status: DC | PRN
  Administered 2020-11-19 – 2020-11-20 (×3): 650 mg via ORAL
  Filled 2020-11-19 (×3): qty 2

## 2020-11-19 MED ORDER — CLONIDINE HCL 0.2 MG PO TABS
0.2000 mg | ORAL_TABLET | Freq: Once | ORAL | Status: AC
  Administered 2020-11-19: 0.2 mg via ORAL
  Filled 2020-11-19: qty 1

## 2020-11-19 MED ORDER — AMLODIPINE BESYLATE 10 MG PO TABS
10.0000 mg | ORAL_TABLET | Freq: Every day | ORAL | Status: DC
  Administered 2020-11-19 – 2020-11-20 (×2): 10 mg via ORAL
  Filled 2020-11-19 (×2): qty 1

## 2020-11-19 MED ORDER — BUPROPION HCL ER (XL) 150 MG PO TB24
150.0000 mg | ORAL_TABLET | Freq: Every morning | ORAL | Status: DC
  Administered 2020-11-19: 150 mg via ORAL
  Filled 2020-11-19: qty 1

## 2020-11-19 MED ORDER — HYDRALAZINE HCL 25 MG PO TABS
100.0000 mg | ORAL_TABLET | Freq: Three times a day (TID) | ORAL | Status: DC
  Administered 2020-11-19 – 2020-11-20 (×5): 100 mg via ORAL
  Filled 2020-11-19 (×4): qty 4

## 2020-11-19 MED ORDER — MAGNESIUM HYDROXIDE 400 MG/5ML PO SUSP: 30.0000 mL | Freq: Every day | ORAL | Status: DC | PRN

## 2020-11-19 MED ORDER — VENLAFAXINE HCL ER 150 MG PO CP24
150.0000 mg | ORAL_CAPSULE | Freq: Every day | ORAL | Status: DC
  Administered 2020-11-19 – 2020-11-20 (×2): 150 mg via ORAL
  Filled 2020-11-19 (×2): qty 1

## 2020-11-19 MED ORDER — TRAZODONE HCL 50 MG PO TABS: 50.0000 mg | ORAL_TABLET | Freq: Every evening | ORAL | Status: DC | PRN

## 2020-11-19 MED ORDER — ONDANSETRON 4 MG PO TBDP: 8.0000 mg | ORAL_TABLET | Freq: Once | ORAL | Status: DC

## 2020-11-19 MED ORDER — ALBUTEROL SULFATE HFA 108 (90 BASE) MCG/ACT IN AERS: 2.0000 | INHALATION_SPRAY | Freq: Four times a day (QID) | RESPIRATORY_TRACT | Status: DC | PRN

## 2020-11-19 NOTE — Progress Notes (Signed)
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 Final   APPearance 08/26/2020 HAZY (A) CLEAR Final   Specific Gravity, Urine 08/26/2020 1.019  1.005 - 1.030 Final   pH 08/26/2020 7.0  5.0 - 8.0 Final   Glucose, UA 08/26/2020 NEGATIVE  NEGATIVE mg/dL Final   Hgb urine dipstick 08/26/2020 LARGE (A) NEGATIVE Final   Bilirubin Urine 08/26/2020 NEGATIVE  NEGATIVE Final   Ketones, ur 08/26/2020 NEGATIVE  NEGATIVE mg/dL Final   Protein, ur 08/26/2020 30 (A) NEGATIVE mg/dL Final   Nitrite 08/26/2020 NEGATIVE  NEGATIVE Final   Leukocytes,Ua 08/26/2020 TRACE (A) NEGATIVE Final   RBC / HPF 08/26/2020 >50 (A) 0 - 5  RBC/hpf Final   WBC, UA 08/26/2020 11-20  0 - 5 WBC/hpf Final   Bacteria, UA 08/26/2020 RARE (A) NONE SEEN Final   Squamous Epithelial / LPF 08/26/2020 0-5  0 - 5 Final   Mucus 08/26/2020 PRESENT   Final   Performed at [facility], [place] [address], [place], [place] [address]   Specimen Description 08/26/2020 Krystal [name] CATCH   Final   Special Requests 08/26/2020 NONE   Final   Culture 08/26/2020    Final                   Value:NO GROWTH NO GROUP B STREP (S.AGALACTIAE) ISOLATED Performed at [facility], [place] [address], [place], [place] [address]    Report Status 08/26/2020 08/27/2020 FINAL   Final  Admission on 08/23/2020, Discharged on 08/24/2020  Component Date Value Ref Range Status   Preg Test, Ur 08/23/2020 POSITIVE (A) NEGATIVE Final   Comment:        THE SENSITIVITY OF THIS METHODOLOGY IS >20 mIU/mL. Performed at [facility], [place] [address] [place], [place] [address]    WBC 08/23/2020 5.6  4.0 - 10.5 K/uL Final   RBC 08/23/2020 4.38  3.87 - 5.11 MIL/uL Final   Hemoglobin 08/23/2020 13.7  12.0 - 15.0 g/dL Final   HCT 08/23/2020 40.4  36.0 - 46.0 % Final   MCV 08/23/2020 92.2  80.0 - 100.0 fL Final   MCH 08/23/2020 31.3  26.0 - 34.0 pg Final   MCHC 08/23/2020 33.9  30.0 - 36.0 g/dL Final   RDW 08/23/2020 12.4  11.5 - 15.5 % Final   Platelets 08/23/2020 243  150 - 400 K/uL Final   nRBC 08/23/2020 0.0  0.0 - 0.2 % Final   Performed at [place] [address], [place], [place] [address]   hCG, Levy Sjogren, S 08/23/2020 3,067 (A) <5 mIU/mL Final   Comment:          GEST. AGE      CONC.  (mIU/mL)   <=1 WEEK        5 - 50     2 WEEKS       50 - 500     3 WEEKS       100 - 10,000     4 WEEKS     1,000 - 30,000     5 WEEKS     3,500 - 115,000   6-8 WEEKS     12,000 - 270,000    12 WEEKS     15,000 - 220,000        FEMALE AND NON-PREGNANT FEMALE:     LESS THAN 5 mIU/mL Performed at [facility], [place] [address] [place], [place]  [address]    ABO/RH(D) 08/23/2020 O POS   Final   No rh immune globuloin 08/23/2020    Final                   Value:NOT A RH IMMUNE GLOBULIN CANDIDATE, PT RH POSITIVE Performed at [facility], [place] [address], [place], [place] [address]    Yeast Wet Prep HPF POC 08/23/2020 NONE SEEN  NONE SEEN Final   Trich, Wet Prep 08/23/2020 NONE SEEN  NONE SEEN Final   Clue Cells Wet Prep HPF POC 08/23/2020 NONE SEEN  NONE SEEN Final   WBC, Wet Prep HPF POC 08/23/2020 FEW (A) NONE SEEN Final   Sperm 08/23/2020 NONE SEEN   Final   Performed at [facility], [place] [address], [place], [place] [address]   Neisseria Gonorrhea 08/23/2020 Negative   Final   Chlamydia 08/23/2020  Negative   Final   Comment 08/23/2020 Normal Reference [place] Chlamydia - Negative   Final   Comment 08/23/2020 Normal Reference Range Neisseria Gonorrhea - Negative   Final   Sodium 08/23/2020 138  135 - 145 mmol/L Final   Potassium 08/23/2020 3.2 (A) 3.5 - 5.1 mmol/L Final   Chloride 08/23/2020 103  98 - 111 mmol/L Final   CO2 08/23/2020 25  22 - 32 mmol/L Final   Glucose, Bld 08/23/2020 96  70 - 99 mg/dL Final   Glucose reference range applies only to samples taken after fasting for at least 8 hours.   BUN 08/23/2020 <5 (A) 6 - 20 mg/dL Final   Creatinine, Ser 08/23/2020 0.68  0.44 - 1.00 mg/dL Final   Calcium 08/23/2020 8.9  8.9 - 10.3 mg/dL Final   Total Protein 08/23/2020 6.6  6.5 - 8.1 g/dL Final   Albumin 08/23/2020 4.2  3.5 - 5.0 g/dL Final   AST 08/23/2020 48 (A) 15 - 41 U/L Final   ALT 08/23/2020 60 (A) 0 - 44 U/L Final   Alkaline Phosphatase 08/23/2020 54  38 - 126 U/L Final   Total Bilirubin 08/23/2020 0.6  0.3 - 1.2 mg/dL Final   GFR, Estimated 08/23/2020 >60  >60 mL/min Final   Comment: (NOTE) Calculated using the CKD-EPI Creatinine Equation (2021)    Anion gap 08/23/2020 10  5 - 15 Final   Performed at [place] [address], [place], [place] [address]   Creatinine, Urine 08/23/2020 89.38   mg/dL Final   Total Protein, Urine 08/23/2020 19  mg/dL Final   NO NORMAL RANGE ESTABLISHED FOR THIS TEST   Protein Creatinine Ratio 08/23/2020 0.21 (A) 0.00 - 0.15 mg/mg[Cre] Final   Performed at [facility] [address] [place], [place] [address]    Blood Alcohol level:  Lab Results  Component Value Date   ETH <10 46/50/3546    Metabolic Disorder Labs: No results found for: HGBA1C, MPG No results found for: PROLACTIN No results found for: CHOL, TRIG, HDL, CHOLHDL, VLDL, LDLCALC  Therapeutic Lab Levels: No results found for: LITHIUM No results found for: VALPROATE No components found for:  CBMZ  Physical Findings   GAD-7    Flowsheet Row Office Visit from 10/09/2020 in [place] [facility] [place] at [facility] Office Visit from 09/26/2020 in [place] for [employer] at [facility] Clinical Support from 12/22/2017 in [place] for [facility]  Total GAD-7 Score 17 20 3       PHQ2-9    [place] Visit from 10/09/2020 in [place] for [place] at [facility] Office Visit from 09/26/2020 in [place] for [place] at [facility] for [place] from 12/22/2017 in [place] for [place]  PHQ-2 Total Score 5 5 2   PHQ-9 Total Score 19 20 8       [place] [facility] from 11/18/2020 in [facility] Most recent reading at 11/19/2020 12:15 AM ED from 11/18/2020 in [place] DEPT Most recent reading at 11/18/2020  4:08 PM ED from 11/12/2020 in [facility] [place] Most recent reading at 11/12/2020  9:52 AM  C-SSRS RISK CATEGORY Low Risk No Risk No Risk        Musculoskeletal  Strength & Muscle Tone: within normal limits Gait & Station: normal Patient leans: N/A  Psychiatric Specialty Exam  Presentation  General Appearance:  Appropriate for Environment; Fairly  Groomed (wearing hospital scrubs)  Eye Contact:Good  Speech:Normal Rate; Clear and Coherent  Speech Volume:Normal  Handedness:Right   Mood and Affect  Mood:Anxious; Depressed  Affect:Congruent; Depressed; Tearful; Other (comment); Full Range (intermittently appropriately tearful when discussing past trauma)   Thought Process  Thought Processes:Coherent; Linear  Descriptions of Associations:Intact  Orientation:Full (Time, Place and Person)  Thought Content:Logical; WDL  Diagnosis of Schizophrenia or Schizoaffective disorder in past: No    Hallucinations:Hallucinations: None  Ideas of Reference:None  Suicidal Thoughts:Suicidal Thoughts: No SI Passive Intent and/or Plan: Without Intent; Without Plan  Homicidal Thoughts:Homicidal Thoughts: No   Sensorium  Memory:Immediate Good; Recent Good; Remote Fair  Judgment:Good  Insight:Good   Executive Functions  Concentration:Good  Attention Span:Good  [place] of Knowledge:Good  Language:Good   Psychomotor Activity  Psychomotor Activity:Psychomotor Activity: Normal   Assets  Assets:Housing; Transportation; Resilience; Communication Skills; Desire for Improvement; Intimacy; Financial Resources/Insurance   Sleep  Sleep:Sleep: Good Number of Hours of Sleep: 4   Nutritional Assessment (For OBS and FBC admissions only) Has the patient had a weight loss or gain of 10 pounds or more in the last 3 months?: No Has the patient had a decrease in food intake/or appetite?: No Does the patient have dental problems?: No Does the patient have eating habits or behaviors that may be indicators of an eating disorder including binging or inducing vomiting?: No Has the patient recently lost weight without trying?: 0 Has the patient been eating poorly because of a decreased appetite?: 0 Malnutrition Screening Tool Score: 0   Physical Exam  Physical Exam Vitals and nursing note reviewed.  Constitutional:       Appearance: Normal appearance.  HENT:     Head: Normocephalic.  Eyes:     Extraocular Movements: Extraocular movements intact.     Conjunctiva/sclera: Conjunctivae normal.  Cardiovascular:     Rate and Rhythm: Normal rate and regular rhythm.     Heart sounds: Normal heart sounds.  Pulmonary:     Effort: Pulmonary effort is normal.     Breath sounds: Normal breath sounds.  Abdominal:     General: Abdomen is flat. Bowel sounds are normal.  Musculoskeletal:        General: Normal range of motion.     Cervical back: Normal range of motion and neck supple.     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  Neurological:     General: No focal deficit present.     Mental Status: She is alert.   Review of Systems  Constitutional:  Positive for weight loss. Negative for chills, fever and malaise/fatigue.  HENT:  Positive for tinnitus.   Eyes: Negative.   Respiratory: Negative.    Cardiovascular: Negative.   Gastrointestinal: Negative.   Musculoskeletal:  Positive for back pain and myalgias.  Skin: Negative.   Psychiatric/Behavioral:  Positive for depression. Negative for suicidal ideas.   Blood pressure (!) 150/89, pulse 75, temperature 98.3 F (36.8 C), temperature source Oral, resp. rate 18, SpO2 98 %. There is no height or weight on file to calculate BMI.  Treatment Plan Summary:  Krystal [name] is a 30yo with past medical history of  hashimoto's disease, hypertension, fibromyalgia, hypertension, MDD, post-partum depression, anxiety, PTSD, and ADHD here from [facility] for syncopal episode and SI without plan. She continues to have depressed mood and increased anxiety today which seem to be due  to recent life stressors. She reports numerous sx that would be consistent with PTSD-nightmares, flashbacks, avoidance, intrusive thoughts. Patient does report having periods of time of up to 72 hours with little sleep and  increased energy; however, appears to be more related to to anxiety vs a manic or hypomanic episode.  We will restart home medications and make adjustments to regiment as outlined below. Patient remains appropriate for the [facility].   Daily contact with patient to assess and evaluate symptoms and progress in treatment and Medication management    MDD, reccurent, severe, w/o psychosis PTSD -d/c bupropion 150mg  daily due to increased anxiety -continue home medication venlafaxine 150mg  daily for depressive symptoms -start hydroxyzine 25mg  TID for anxiety symptoms -start prazosin 1mg  qhs for PTSD related nightmares  Medical management  Hypertension - restart home medications: amlodipine 10mg  daily, hydralazine 100mg  TID. -Continue to monitor blood pressures -most recent BP 150/89-improved once restart home medications  Fibromyalgia -tylenol 650mg  PRN for pain  Hashimoto's disease -restart home medication, levothyroxine 156mcg daily  Asthma -no medical management needed at this moment. Continue to monitor patient for symptoms. -PRN albuterol inhaler ordered   Dispo: ongoing. SW assisting-patient requesting IOP/PHP. Will consult with SW for assistance tomorrow. Patient likely to discharge home.  Ival Bible, MD 11/19/2020 4:00 PM

## 2020-11-19 NOTE — ED Notes (Signed)
Message sent to pharmacy requesting Hydralazine be delivered to [facility] this morning per Leandro Reasoner, NP.

## 2020-11-19 NOTE — ED Notes (Addendum)
I was asked if patient would be appropriate for [facility].  Made aware that per a note, patient was medically cleared and provider wants her admitted here. Will monitor

## 2020-11-19 NOTE — ED Notes (Signed)
Pt resting in bed. Respirations even and unlabored. Will continue to monitor for safety.  

## 2020-11-19 NOTE — ED Provider Notes (Signed)
Behavioral Health Admission [facility]&P [facility])  Date: 11/19/20 Patient Name: Krystal [name] MRN: [id number] Chief Complaint:  Chief Complaint  Patient presents with   Suicidal      Diagnoses:  Final diagnoses:  MDD (major depressive disorder), recurrent severe, without psychosis ([place])    HPI: Krystal [name] is a 30 y/o female with psychiatricc history of depression and anxiety. Patient presented to [facility] post syncopal episodes and with complaint of depression and suicidal ideation. Patient was evaluated in the ED and medically cleared. Patient transferred to [facility] for stabilization and treatment of mental health disorders.  Patient seen face to face upon arrival to [facility] and her chart was reviewed by [facility] provider. Patient is alert and oriented X4, calm and cooperative. Patient is speaking in a normal voice with good eye contact. Patient's mood is depress and her affect is congruent. Her thought process is coherent. She denies all medical complaint including chest pain, SOB, dizziness, visual changes, and headache.   Patient continues to report passive suicidal ideation of "wishing to die" she denies plan or intent. She denies self-harming. She admits to past history of suicidal attempt X1 as a teenager. She denies HI/AVH/paranoia and hallucination. She didn't appear to be responding to any internal or external stimuli orexperiencing and delusional thought content during this assessment. She denies all substance use expect for smoking marijuana 1-2 times in a month. She endorses depressive symptoms of isolation, anxiety, worthlessness, poor sleep, fatigue, poor appetite, crying spells, and guilt. She identifies her marriage/relationship with her husband and recent miscarriage (3 months ago) as her main stressor. She reports she is receiving counseling to cope with miscarriage.   PHQ 2-9:  [employer] from 10/09/2020 in [facility] [employer] at [facility] Office Visit from 09/26/2020 in [place] for [employer] at [facility] Clinical Support from 12/22/2017 in [place] for [facility]  Thoughts that you would be better off dead, or of hurting yourself in some way Not at all Not at all Not at all  PHQ-9 Total Score 19 20 8        [place] ED from 11/18/2020 in [facility] Most recent reading at 11/19/2020 12:15 AM ED from 11/18/2020 in [place] DEPT Most recent reading at 11/18/2020  4:08 PM ED from 11/12/2020 in [facility] [place] Most recent reading at 11/12/2020  9:52 AM  C-SSRS RISK CATEGORY Low Risk No Risk No Risk        Total Time spent with patient: 15 minutes  Musculoskeletal  Strength & Muscle Tone: within normal limits Gait & Station: normal Patient leans: Right  Psychiatric Specialty Exam  Presentation General Appearance: Casual  Eye Contact:Good  Speech:Clear and Coherent  Speech Volume:Normal  Handedness:Right   Mood and Affect  Mood:Depressed  Affect:Congruent   Thought Process  Thought Processes:Coherent  Descriptions of Associations:Intact  Orientation:Full (Time, Place and Person)  Thought Content:WDL  Diagnosis of Schizophrenia or Schizoaffective disorder in past: No   Hallucinations:Hallucinations: None  Ideas of Reference:None  Suicidal Thoughts:Suicidal Thoughts: Yes, Passive SI Passive Intent and/or Plan: Without Intent; Without Plan  Homicidal Thoughts:Homicidal Thoughts: No   Sensorium  Memory:Immediate Good; Immediate Poor; Remote Good  Judgment:Good  Insight:Good   Executive Functions  Concentration:Good  Attention Span:Good  [place]  Language:Good   Psychomotor Activity  Psychomotor Activity:No data recorded  Assets  Assets:Communication Skills; Desire for  Improvement; Housing; Social Support;  Vocational/Educational   Sleep  Sleep:Sleep: Poor Number of Hours of Sleep: 4   Nutritional Assessment (For OBS and FBC admissions only) Has the patient had a weight loss or gain of 10 pounds or more in the last 3 months?: No Has the patient had a decrease in food intake/or appetite?: No Does the patient have dental problems?: No Does the patient have eating habits or behaviors that may be indicators of an eating disorder including binging or inducing vomiting?: No Has the patient recently lost weight without trying?: 0 Has the patient been eating poorly because of a decreased appetite?: 0 Malnutrition Screening Tool Score: 0   Physical Exam Vitals and nursing note reviewed.  Constitutional:      General: She is not in acute distress.    Appearance: She is well-developed.  HENT:     Head: Normocephalic and atraumatic.     Mouth/Throat:     Mouth: Mucous membranes are moist.  Eyes:     Conjunctiva/sclera: Conjunctivae normal.  Cardiovascular:     Rate and Rhythm: Normal rate and regular rhythm.     Heart sounds: No murmur heard. Pulmonary:     Effort: Pulmonary effort is normal. No respiratory distress.     Breath sounds: Normal breath sounds.  Abdominal:     Palpations: Abdomen is soft.     Tenderness: There is no abdominal tenderness.  Musculoskeletal:     Cervical back: Neck supple.  Skin:    General: Skin is warm and dry.  Neurological:     Mental Status: She is alert and oriented to person, place, and time.  Psychiatric:        Attention and Perception: Attention normal.        Mood and Affect: Mood is anxious and depressed.        Speech: Speech normal.        Behavior: Behavior normal. Behavior is cooperative.        Thought Content: Thought content is not paranoid or delusional (passive). Thought content includes suicidal ideation. Thought content does not include homicidal ideation. Thought content includes suicidal plan. Thought content does not include  homicidal plan.   ROS  Blood pressure (!) 166/114, pulse 93, temperature 98.5 F (36.9 C), temperature source Oral, resp. rate 18, SpO2 96 %. There is no height or weight on file to calculate BMI.  Past Psychiatric History: depression and anxiety    Is the patient at risk to self? No  Has the patient been a risk to self in the past 6 months? No .    Has the patient been a risk to self within the distant past? No   Is the patient a risk to others? No   Has the patient been a risk to others in the past 6 months? No   Has the patient been a risk to others within the distant past? No   Past Medical History:  Past Medical History:  Diagnosis Date   Anxiety    Asthma    HELLP (hemolytic anemia/elev liver enzymes/low platelets in pregnancy) 12/08/2017   Hypertension    IC (interstitial cystitis)    Kidney stones    Migraines    Severe uncontrolled hypertension 10/08/2017   Formatting of this note might be different from the original. Diagnosed at age 51 years Blood pressure control this pregnancy has been challenging with continued severe range blood pressures at home and on prenatal visits  Current regimen: hydralazine 75 mg  TID, amlodipine 10mg  daily, coreg 25mg  BID Previous medications including: lisinopril, HCTZ, amlodipine, labetalol  Intolerance to Procardia a   UTI (urinary tract infection)     Past Surgical History:  Procedure Laterality Date   APPENDECTOMY     BIOPSY  05/10/2020   Procedure: BIOPSY;  Surgeon: Arta Silence, MD;  Location: [facility];  Service: Endoscopy;;   CESAREAN SECTION N/A 12/06/2017   Procedure: CESAREAN SECTION;  Surgeon: Sloan Leiter, MD;  Location: [place];  Service: Obstetrics;  Laterality: N/A;   CHOLECYSTECTOMY N/A 05/11/2020   Procedure: SINGLE SITE LAPAROSCOPIC CHOLECYSTECTOMY AND LIVER BIOSY;  Surgeon: Michael Boston, MD;  [facility];  Service: General;  Laterality: N/A;   ESOPHAGOGASTRODUODENOSCOPY (EGD) WITH PROPOFOL N/A  05/10/2020   Procedure: ESOPHAGOGASTRODUODENOSCOPY (EGD) WITH PROPOFOL;  Surgeon: Arta Silence, MD;  [facility];  Service: Endoscopy;  Laterality: N/A;   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   HAND RECONSTRUCTION Right    INCISION AND DRAINAGE     NASAL SEPTUM SURGERY      Family History:  Family History  Problem Relation Age of Onset   Hypertension Mother    Hypertension Father     Social History:  Social History   Socioeconomic History   Marital status: Married    Spouse name: Not on file   Number of children: Not on file   Years of education: Not on file   Highest education level: Not on file  Occupational History   Not on file  Tobacco Use   Smoking status: Never   Smokeless tobacco: Never   Tobacco comments:    She tried a cigarette and never smoked again  Vaping Use   Vaping Use: Never used  Substance and Sexual Activity   Alcohol use: Yes    Comment: bottle of wine a week-occ   Drug use: No   Sexual activity: Not on file  Other Topics Concern   Not on file  Social History Narrative   Not on file   Social Determinants of Health   Financial Resource Strain: Not on file  Food Insecurity: Food Insecurity Present   Worried About [place] in the Last Year: Sometimes true   Arboriculturist in the Last Year: Never true  Transportation Needs: Unmet Transportation Needs   Lack of Transportation (Medical): Yes   Lack of Transportation (Non-Medical): Yes  Physical Activity: Not on file  Stress: Not on file  Social Connections: Not on file  Intimate Partner Violence: Not on file    SDOH:  SDOH Screenings   Alcohol Screen: Not on file  Depression (PHQ2-9): Medium Risk   PHQ-2 Score: 19  Financial Resource Strain: Not on file  Food Insecurity: Food Insecurity Present   Worried About Charity fundraiser in the Last Year: Sometimes true   Arboriculturist in the Last Year: Never true  Housing: Not on file  Physical  Activity: Not on file  Social Connections: Not on file  Stress: Not on file  Tobacco Use: Low Risk    Smoking Tobacco Use: Never   Smokeless Tobacco Use: Never  Transportation Needs: Unmet Transportation Needs   Lack of Transportation (Medical): Yes   Lack of Transportation (Non-Medical): Yes    Last Labs:  Admission on 11/18/2020, Discharged on 11/18/2020  Component Date Value Ref Range Status   WBC 11/18/2020 9.2  4.0 - 10.5 K/uL Final   RBC 11/18/2020 4.97  3.87 - 5.11 MIL/uL Final   Hemoglobin 11/18/2020 15.9 (A) 12.0 - 15.0 g/dL Final   HCT 11/18/2020 45.5  36.0 - 46.0 % Final   MCV 11/18/2020 91.5  80.0 - 100.0 fL Final   MCH 11/18/2020 32.0  26.0 - 34.0 pg Final   MCHC 11/18/2020 34.9  30.0 - 36.0 g/dL Final   RDW 11/18/2020 12.6  11.5 - 15.5 % Final   Platelets 11/18/2020 356  150 - 400 K/uL Final   nRBC 11/18/2020 0.0  0.0 - 0.2 % Final   Performed at [facility], [place] [address], [place], [place] [address]   Color, Urine 11/18/2020 YELLOW (A) YELLOW Final   APPearance 11/18/2020 CLEAR (A) CLEAR Final   Specific Gravity, Urine 11/18/2020 1.020  1.005 - 1.030 Final   pH 11/18/2020 7.0  5.0 - 8.0 Final   Glucose, UA 11/18/2020 NEGATIVE  NEGATIVE mg/dL Final   Hgb urine dipstick 11/18/2020 SMALL (A) NEGATIVE Final   Bilirubin Urine 11/18/2020 NEGATIVE  NEGATIVE Final   Ketones, ur 11/18/2020 NEGATIVE  NEGATIVE mg/dL Final   Protein, ur 11/18/2020 NEGATIVE  NEGATIVE mg/dL Final   Nitrite 11/18/2020 NEGATIVE  NEGATIVE Final   Leukocytes,Ua 11/18/2020 NEGATIVE  NEGATIVE Final   RBC / HPF 11/18/2020 6-10  0 - 5 RBC/hpf Final   WBC, UA 11/18/2020 0-5  0 - 5 WBC/hpf Final   Bacteria, UA 11/18/2020 RARE (A) NONE SEEN Final   Squamous Epithelial / LPF 11/18/2020 0-5  0 - 5 Final   Mucus 11/18/2020 PRESENT   Final   Performed at [facility], [place] [address] [place], [place] [address]   Glucose-Capillary 11/18/2020 100 (A) 70 - 99 mg/dL  Final   Glucose reference range applies only to samples taken after fasting for at least 8 hours.   I-stat hCG, quantitative 11/18/2020 <5.0  <5 mIU/mL Final   Comment 3 11/18/2020          Final   Comment:   GEST. AGE      CONC.  (mIU/mL)   <=1 WEEK        5 - 50     2 WEEKS       50 - 500     3 WEEKS       100 - 10,000     4 WEEKS     1,000 - 30,000        FEMALE AND NON-PREGNANT FEMALE:     LESS THAN 5 mIU/mL    Alcohol, Ethyl (B) 11/18/2020 <10  <10 mg/dL Final   Comment: (NOTE) Lowest detectable limit for serum alcohol is 10 mg/dL.  For medical purposes only. Performed at [facility], [place] [address], [place], [place] [address]    Salicylate Lvl 16/60/6301 <7.0 (A) 7.0 - 30.0 mg/dL Final   Performed at [place] [address], [place], [place] [address]   Acetaminophen (Tylenol), Serum 11/18/2020 <10 (A) 10 - 30 ug/mL Final   Comment: (NOTE) Therapeutic concentrations vary significantly. A range of 10-30 ug/mL  may be an effective concentration for many patients. However, some  are best treated at concentrations outside of this range. Acetaminophen concentrations >150 ug/mL at 4 hours after ingestion  and >50 ug/mL at 12 hours after ingestion are often associated with  toxic reactions.  Performed at [facility], [place] [address] [place], [place] [address]    Opiates 11/18/2020 NONE DETECTED  NONE DETECTED Final   Cocaine  11/18/2020 NONE DETECTED  NONE DETECTED Final   Benzodiazepines 11/18/2020 POSITIVE (A) NONE DETECTED Final   Amphetamines 11/18/2020 NONE DETECTED  NONE DETECTED Final   Tetrahydrocannabinol 11/18/2020 POSITIVE (A) NONE DETECTED Final   Barbiturates 11/18/2020 NONE DETECTED  NONE DETECTED Final   Comment: (NOTE) DRUG SCREEN FOR MEDICAL PURPOSES ONLY.  IF CONFIRMATION IS NEEDED FOR ANY PURPOSE, NOTIFY LAB WITHIN 5 DAYS.  LOWEST DETECTABLE LIMITS FOR URINE DRUG SCREEN Drug Class                      Cutoff (ng/mL) Amphetamine and metabolites    1000 Barbiturate and metabolites    200 Benzodiazepine                 785 Tricyclics and metabolites     300 Opiates and metabolites        300 Cocaine and metabolites        300 THC                            50 Performed at [facility], [place] [address] [place], [place] [address]    Sodium 11/18/2020 140  135 - 145 mmol/L Final   Potassium 11/18/2020 3.0 (A) 3.5 - 5.1 mmol/L Final   Chloride 11/18/2020 106  98 - 111 mmol/L Final   CO2 11/18/2020 27  22 - 32 mmol/L Final   Glucose, Bld 11/18/2020 94  70 - 99 mg/dL Final   Glucose reference range applies only to samples taken after fasting for at least 8 hours.   BUN 11/18/2020 13  6 - 20 mg/dL Final   Creatinine, Ser 11/18/2020 0.77  0.44 - 1.00 mg/dL Final   Calcium 11/18/2020 9.5  8.9 - 10.3 mg/dL Final   Total Protein 11/18/2020 7.6  6.5 - 8.1 g/dL Final   Albumin 11/18/2020 4.6  3.5 - 5.0 g/dL Final   AST 11/18/2020 21  15 - 41 U/L Final   ALT 11/18/2020 25  0 - 44 U/L Final   Alkaline Phosphatase 11/18/2020 59  38 - 126 U/L Final   Total Bilirubin 11/18/2020 0.6  0.3 - 1.2 mg/dL Final   GFR, Estimated 11/18/2020 >60  >60 mL/min Final   Comment: (NOTE) Calculated using the CKD-EPI Creatinine Equation (2021)    Anion gap 11/18/2020 7  5 - 15 Final   Performed at [facility], [place] [address], [place], [place] [address]   SARS Coronavirus 2 by RT PCR 11/18/2020 NEGATIVE  NEGATIVE Final   Comment: (NOTE) SARS-CoV-2 target nucleic acids are NOT DETECTED.  The SARS-CoV-2 RNA is generally detectable in upper respiratory specimens during the acute phase of infection. The lowest concentration of SARS-CoV-2 viral copies this assay can detect is 138 copies/mL. A negative result does not preclude SARS-Cov-2 infection and should not be used as the sole basis for treatment or other patient management decisions. A negative result may occur  with  improper specimen collection/handling, submission of specimen other than nasopharyngeal swab, presence of viral mutation(s) within the areas targeted by this assay, and inadequate number of viral copies(<138 copies/mL). A negative result must be combined with clinical observations, patient history, and epidemiological information. The expected result is Negative.  Fact Sheet for Patients:  EntrepreneurPulse.com.au  Fact Sheet for Healthcare Providers:  IncredibleEmployment.be  This test is no  t yet approved or cleared by the [place] and  has been authorized for detection and/or diagnosis of SARS-CoV-2 by FDA under an Emergency Use Authorization (EUA). This EUA will remain  in effect (meaning this test can be used) for the duration of the COVID-19 declaration under Section 564(b)(1) of the Act, 21 U.S.C.section 360bbb-3(b)(1), unless the authorization is terminated  or revoked sooner.       Influenza A by PCR 11/18/2020 NEGATIVE  NEGATIVE Final   Influenza B by PCR 11/18/2020 NEGATIVE  NEGATIVE Final   Comment: (NOTE) The Xpert Xpress SARS-CoV-2/FLU/RSV plus assay is intended as an aid in the diagnosis of influenza from Nasopharyngeal swab specimens and should not be used as a sole basis for treatment. Nasal washings and aspirates are unacceptable for Xpert Xpress SARS-CoV-2/FLU/RSV testing.  Fact Sheet for Patients: EntrepreneurPulse.com.au  Fact Sheet for Healthcare Providers: IncredibleEmployment.be  This test is not yet approved or cleared by the [place] FDA and has been authorized for detection and/or diagnosis of SARS-CoV-2 by FDA under an Emergency Use Authorization (EUA). This EUA will remain in effect (meaning this test can be used) for the duration of the COVID-19 declaration under Section 564(b)(1) of the Act, 21 U.S.C. section 360bbb-3(b)(1),  unless the authorization is terminated or revoked.  Performed at [facility], [place] [address], [place], [place] [address]   Admission on 09/19/2020, Discharged on 09/19/2020  Component Date Value Ref Range Status   Sodium 09/19/2020 136  135 - 145 mmol/L Final   Potassium 09/19/2020 3.9  3.5 - 5.1 mmol/L Final   Chloride 09/19/2020 107  98 - 111 mmol/L Final   CO2 09/19/2020 21 (A) 22 - 32 mmol/L Final   Glucose, Bld 09/19/2020 91  70 - 99 mg/dL Final   Glucose reference range applies only to samples taken after fasting for at least 8 hours.   BUN 09/19/2020 14  6 - 20 mg/dL Final   Creatinine, Ser 09/19/2020 0.73  0.44 - 1.00 mg/dL Final   Calcium 09/19/2020 9.4  8.9 - 10.3 mg/dL Final   GFR, Estimated 09/19/2020 >60  >60 mL/min Final   Comment: (NOTE) Calculated using the CKD-EPI Creatinine Equation (2021)    Anion gap 09/19/2020 8  5 - 15 Final   Performed at [facility], [place] [address], [place], [place] [address]   WBC 09/19/2020 7.1  4.0 - 10.5 K/uL Final   Comment: REPEATED TO VERIFY WHITE COUNT CONFIRMED ON SMEAR    RBC 09/19/2020 4.86  3.87 - 5.11 MIL/uL Final   Hemoglobin 09/19/2020 15.3 (A) 12.0 - 15.0 g/dL Final   HCT 09/19/2020 44.5  36.0 - 46.0 % Final   MCV 09/19/2020 91.6  80.0 - 100.0 fL Final   MCH 09/19/2020 31.5  26.0 - 34.0 pg Final   MCHC 09/19/2020 34.4  30.0 - 36.0 g/dL Final   RDW 09/19/2020 12.6  11.5 - 15.5 % Final   Platelets 09/19/2020 137 (A) 150 - 400 K/uL Final   Comment: REPEATED TO VERIFY PLATELET COUNT CONFIRMED BY SMEAR    nRBC 09/19/2020 0.0  0.0 - 0.2 % Final   Neutrophils Relative % 09/19/2020 58  % Final   Neutro Abs 09/19/2020 4.1  1.7 - 7.7 K/uL Final   Lymphocytes Relative 09/19/2020 32  % Final   Lymphs Abs 09/19/2020 2.3  0.7 - 4.0 K/uL Final   Monocytes Relative 09/19/2020 8  % Final   Monocytes Absolute 09/19/2020 0.6  0.1 -  1.0 K/uL Final   Eosinophils Relative 09/19/2020 0  % Final   Eosinophils  Absolute 09/19/2020 0.0  0.0 - 0.5 K/uL Final   Basophils Relative 09/19/2020 1  % Final   Basophils Absolute 09/19/2020 0.1  0.0 - 0.1 K/uL Final   Immature Granulocytes 09/19/2020 1  % Final   Abs Immature Granulocytes 09/19/2020 0.05  0.00 - 0.07 K/uL Final   Performed at [facility], [place] [address] [place] [address], [place], [place] [address]   Troponin I (High Sensitivity) 09/19/2020 5  <18 ng/L Final   Comment: (NOTE) Elevated high sensitivity troponin I (hsTnI) values and significant  changes across serial measurements may suggest ACS but many other  chronic and acute conditions are known to elevate hsTnI results.  Refer to the "Links" section for chest pain algorithms and additional  guidance. Performed at [facility], [place] [address], [place], [place] [address]    Troponin I (High Sensitivity) 09/19/2020 6  <18 ng/L Final   Comment: (NOTE) Elevated high sensitivity troponin I (hsTnI) values and significant  changes across serial measurements may suggest ACS but many other  chronic and acute conditions are known to elevate hsTnI results.  Refer to the "Links" section for chest pain algorithms and additional  guidance. Performed at [facility], [place] [address], [place], [place] [address]   Admission on 08/26/2020, Discharged on 08/26/2020  Component Date Value Ref Range Status   hCG, Beta Chain, Quant, S 08/26/2020 650 (A) <5 mIU/mL Final   Comment:          GEST. AGE      CONC.  (mIU/mL)   <=1 WEEK        5 - 50     2 WEEKS       50 - 500     3 WEEKS       100 - 10,000     4 WEEKS     1,000 - 30,000     5 WEEKS     3,500 - 115,000   6-8 WEEKS     12,000 - 270,000    12 WEEKS     15,000 - 220,000        FEMALE AND NON-PREGNANT FEMALE:     LESS THAN 5 mIU/mL Performed at [facility], [place] [address], [place], [place] [address]    Color, Urine 08/26/2020 YELLOW  YELLOW Final   APPearance 08/26/2020 HAZY (A) CLEAR Final   Specific Gravity, Urine 08/26/2020 1.019   1.005 - 1.030 Final   pH 08/26/2020 7.0  5.0 - 8.0 Final   Glucose, UA 08/26/2020 NEGATIVE  NEGATIVE mg/dL Final   Hgb urine dipstick 08/26/2020 LARGE (A) NEGATIVE Final   Bilirubin Urine 08/26/2020 NEGATIVE  NEGATIVE Final   Ketones, ur 08/26/2020 NEGATIVE  NEGATIVE mg/dL Final   Protein, ur 08/26/2020 30 (A) NEGATIVE mg/dL Final   Nitrite 08/26/2020 NEGATIVE  NEGATIVE Final   Leukocytes,Ua 08/26/2020 TRACE (A) NEGATIVE Final   RBC / HPF 08/26/2020 >50 (A) 0 - 5 RBC/hpf Final   WBC, UA 08/26/2020 11-20  0 - 5 WBC/hpf Final   Bacteria, UA 08/26/2020 RARE (A) NONE SEEN Final   Squamous Epithelial / LPF 08/26/2020 0-5  0 - 5 Final   Mucus 08/26/2020 PRESENT   Final   Performed at [facility], [place] [address], [place], [place] [address]   Specimen Description 08/26/2020 Clelia Croft CATCH   Final   Special Requests 08/26/2020  NONE   Final   Culture 08/26/2020    Final                   Value:NO GROWTH NO GROUP B STREP (S.AGALACTIAE) ISOLATED Performed at [facility], [place] [address], [place], [place] [address]    Report Status 08/26/2020 08/27/2020 FINAL   Final  Admission on 08/23/2020, Discharged on 08/24/2020  Component Date Value Ref Range Status   Preg Test, Ur 08/23/2020 POSITIVE (A) NEGATIVE Final   Comment:        THE SENSITIVITY OF THIS METHODOLOGY IS >20 mIU/mL. Performed at [facility], [place] [address], [place], [place] [address]    WBC 08/23/2020 5.6  4.0 - 10.5 K/uL Final   RBC 08/23/2020 4.38  3.87 - 5.11 MIL/uL Final   Hemoglobin 08/23/2020 13.7  12.0 - 15.0 g/dL Final   HCT 08/23/2020 40.4  36.0 - 46.0 % Final   MCV 08/23/2020 92.2  80.0 - 100.0 fL Final   MCH 08/23/2020 31.3  26.0 - 34.0 pg Final   MCHC 08/23/2020 33.9  30.0 - 36.0 g/dL Final   RDW 08/23/2020 12.4  11.5 - 15.5 % Final   Platelets 08/23/2020 243  150 - 400 K/uL Final   nRBC 08/23/2020 0.0  0.0 - 0.2 % Final   Performed at [place] [address], [place], [place]  [address]   hCG, Levy Sjogren, S 08/23/2020 3,067 (A) <5 mIU/mL Final   Comment:          GEST. AGE      CONC.  (mIU/mL)   <=1 WEEK        5 - 50     2 WEEKS       50 - 500     3 WEEKS       100 - 10,000     4 WEEKS     1,000 - 30,000     5 WEEKS     3,500 - 115,000   6-8 WEEKS     12,000 - 270,000    12 WEEKS     15,000 - 220,000        FEMALE AND NON-PREGNANT FEMALE:     LESS THAN 5 mIU/mL Performed at [facility], [place] [address], [place], [place] [address]    ABO/RH(D) 08/23/2020 O POS   Final   No rh immune globuloin 08/23/2020    Final                   Value:NOT A RH IMMUNE GLOBULIN CANDIDATE, PT RH POSITIVE Performed at [facility], [place] [address], [place], [place] [address]    Yeast Wet Prep HPF POC 08/23/2020 NONE SEEN  NONE SEEN Final   Trich, Wet Prep 08/23/2020 NONE SEEN  NONE SEEN Final   Clue Cells Wet Prep HPF POC 08/23/2020 NONE SEEN  NONE SEEN Final   WBC, Wet Prep HPF POC 08/23/2020 FEW (A) NONE SEEN Final   Sperm 08/23/2020 NONE SEEN   Final   Performed at [facility], [place] [address], [place], [place] [address]   Neisseria Gonorrhea 08/23/2020 Negative   Final   Chlamydia 08/23/2020 Negative   Final   Comment 08/23/2020 Normal Reference [place] Chlamydia - Negative   Final   Comment 08/23/2020 Normal Reference Range Neisseria Gonorrhea - Negative   Final   Sodium 08/23/2020 138  135 - 145 mmol/L Final  Potassium 08/23/2020 3.2 (A) 3.5 - 5.1 mmol/L Final   Chloride 08/23/2020 103  98 - 111 mmol/L Final   CO2 08/23/2020 25  22 - 32 mmol/L Final   Glucose, Bld 08/23/2020 96  70 - 99 mg/dL Final   Glucose reference range applies only to samples taken after fasting for at least 8 hours.   BUN 08/23/2020 <5 (A) 6 - 20 mg/dL Final   Creatinine, Ser 08/23/2020 0.68  0.44 - 1.00 mg/dL Final   Calcium 08/23/2020 8.9  8.9 - 10.3 mg/dL Final   Total Protein 08/23/2020 6.6  6.5 - 8.1 g/dL Final   Albumin 08/23/2020 4.2  3.5 - 5.0 g/dL Final   AST  08/23/2020 48 (A) 15 - 41 U/L Final   ALT 08/23/2020 60 (A) 0 - 44 U/L Final   Alkaline Phosphatase 08/23/2020 54  38 - 126 U/L Final   Total Bilirubin 08/23/2020 0.6  0.3 - 1.2 mg/dL Final   GFR, Estimated 08/23/2020 >60  >60 mL/min Final   Comment: (NOTE) Calculated using the CKD-EPI Creatinine Equation (2021)    Anion gap 08/23/2020 10  5 - 15 Final   Performed at [facility] [address], [place], [place] [address]   Creatinine, Urine 08/23/2020 89.38  mg/dL Final   Total Protein, Urine 08/23/2020 19  mg/dL Final   NO NORMAL RANGE ESTABLISHED FOR THIS TEST   Protein Creatinine Ratio 08/23/2020 0.21 (A) 0.00 - 0.15 mg/mg[Cre] Final   Performed at [facility] [address], [place], [place] [address]    Allergies: Red dye, Compazine [prochlorperazine maleate], Other, Vicodin [hydrocodone-acetaminophen], Wheat bran, Amitriptyline hcl, Duloxetine hcl, Reglan [metoclopramide], and Nifedipine  PTA Medications: (Not in a hospital admission)   Medical Decision Making  Patient will be admitted to [facility] for stabilization and treatment of suicidal ideation and depression. - -review available ED lab result -monitor vital signs -continue home medications    Recommendations  Based on my evaluation the patient does not appear to have an emergency medical condition.  Ophelia Shoulder, NP 11/19/20  3:46 AM

## 2020-11-19 NOTE — Progress Notes (Signed)
Patient medication compliant, ate breakfast and attended group. Will continue to monitor for safety.

## 2020-11-19 NOTE — ED Notes (Signed)
Pt eating dinner

## 2020-11-19 NOTE — ED Notes (Signed)
Patient resting in bed.  Continue to monitor for safety.

## 2020-11-19 NOTE — ED Notes (Signed)
Pt attended group 

## 2020-11-19 NOTE — ED Notes (Signed)
Leandro Reasoner, NP notified that Hydralazine 100mg  not in stock in pxyis on [facility] or [facility]. One time order for Clonidine 0.2mg  received.

## 2020-11-19 NOTE — ED Notes (Addendum)
Leandro Reasoner, NP requested BP recheck. BP 177/122, P 77 per dinamap. BP rechecked manually by RN: 166/114. NP notified of results and new order for hydralazine received.

## 2020-11-19 NOTE — ED Notes (Addendum)
Pt reports continued nausea and headache 7/10. Pt ambulated independently to bathroom and denied dizziness. Lindon Romp, NP given update on pt's vital signs and current symptoms. New orders received for Tylenol and Zofran. No signs of acute distress noted. Will continue to monitor for safety.

## 2020-11-19 NOTE — ED Notes (Signed)
Pt resting in bed and states she "feels much better now." Pt declines need for Tylenol or Zofran. Pt denies any needs at this time. Will continue to monitor for safety.

## 2020-11-19 NOTE — ED Notes (Signed)
Pt sitting on bed reading a magazine. A&O x4, calm and cooperative. Pt denies current SI/HI/AVH. Pt reports headache 6/10 and requests Tylenol. Towels, new scrubs, socks, underwear, and feminine hygiene products provided per pt request. Pt denies any other needs at this time. No signs of acute distress noted. Will continue to monitor for safety.

## 2020-11-19 NOTE — Progress Notes (Signed)
Patient resting in bed. Denied SI, HI, AVH.  BP taken and Hydralazine given per eMAR. Patient teary when discussing relationship with husband and home stressors.  Allowed to express herself.  Patient teaching done regarding fall risk.  Patient stated that she often knows when she's about to pass out.  Verbalized understanding of fall measures.  Denied need for anxiety medication at this time.

## 2020-11-19 NOTE — ED Notes (Addendum)
Pt reports worsened headache, "7 to 8" out of 10. Pt states she is nauseous and "starting to feel dizzy when I get up." Vital signs obtained: P 75, BP 176/112, R 20, O2 sat 98% RA. Koren Shiver, RN/AC and Lindon Romp, NP made aware of pt's symptoms. NP advised to give bedtime doses of Hydralazine and Minipress. Extra pillow and ice pack also provided per pt request. RN reviewed fall precautions with pt and pt verbalized understanding. No signs of acute distress noted. Will continue to monitor for safety.

## 2020-11-19 NOTE — Progress Notes (Signed)
Patient currently outside with staff.

## 2020-11-19 NOTE — Progress Notes (Signed)
Elevated BP's, recent falls with patient report of hitting head and right elbow discussed with Dr. Serafina Mitchell.  MD aware of patient history.  Per MD, continue to current plan of care.

## 2020-11-19 NOTE — ED Notes (Addendum)
Pt admitted to [facility] due to passive SI, depression, and anxiety. Pt A&O x4, calm and cooperative. Pt continues to report passive SI with no plan or intent. Pt denies HI/AVH. Pt tolerated admission process and skin assessment well. Pt ambulated independently to unit. Oriented to unit/staff. Fruit cup, juice, and feminine hygiene products given per pt request. Pt denies any other needs at this time. No signs of acute distress noted. Will continue to monitor for safety.

## 2020-11-19 NOTE — ED Notes (Signed)
Pt eating breakfast 
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
## 2020-11-20 DIAGNOSIS — F332 Major depressive disorder, recurrent severe without psychotic features: Secondary | ICD-10-CM | POA: Diagnosis not present

## 2020-11-20 DIAGNOSIS — F4310 Post-traumatic stress disorder, unspecified: Secondary | ICD-10-CM | POA: Diagnosis not present

## 2020-11-20 MED ORDER — PRAZOSIN HCL 1 MG PO CAPS: 1.0000 mg | ORAL_CAPSULE | Freq: Every day | ORAL | 0 refills | Status: DC

## 2020-11-20 NOTE — Group Note (Signed)
Group Topic: Social Support  Group Date: 11/19/2020 Start Time: 1930 End Time: 2000 Facilitators: Luisa Hart, [place]  Department: [facility]  Number of Participants: 1  Group Focus: acceptance, anxiety, and check in Treatment Modality:  Cognitive Behavioral Therapy Interventions utilized were exploration and support Purpose: express feelings and increase insight  Name: Krystal [name] Date of Birth: 04-17-1990  MR: [id number]    Level of Participation: active Quality of Participation: attentive and engaged Interactions with others: gave feedback Mood/Affect: appropriate Triggers (if applicable): Relationships Cognition: logical Progress: Moderate Response: n/a Plan: follow-up needed  Pt wants to seek out couples therapy  Patients Problems:  Patient Active Problem List   Diagnosis Date Noted   MDD (major depressive disorder), recurrent episode, severe ([place]) 11/19/2020   Amenorrhea 05/11/2020   Fibromyalgia 05/11/2020   Gastroesophageal reflux disease 05/11/2020   Interstitial cystitis 05/11/2020   Mild recurrent major depression ([place]) 05/11/2020   Posttraumatic stress disorder 05/11/2020   Primary insomnia 05/11/2020   Essential hypertension 05/11/2020   Acute acalculous cholecystitis s/p lap cholecystectomy 05/11/2020 05/11/2020   Intractable right upper quadrant abdominal pain 05/08/2020   Hypokalemia 05/08/2020   Hypertensive urgency 05/08/2020   Postpartum depression 11/30/2019   Hypothyroidism due to Hashimoto's thyroiditis 10/03/2019   Anxiety 06/16/2019   Asthma 06/07/2019   History of uterine scar from previous surgery 06/07/2019   Obesity 06/07/2019   History of HELLP syndrome, currently pregnant 04/21/2019   Depression during pregnancy 07/20/2018   Lumbar spondylosis 06/23/2018   Sacroiliac inflammation ([place]) 06/23/2018   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   History of upper limb amputation, wrist, 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    Nexplanon insertion 01/19/2018

## 2020-11-20 NOTE — Progress Notes (Signed)
Dr. Serafina Mitchell aware of headache, elevated Bps, complaints of dizziness and nausea during the night, but not currently.

## 2020-11-20 NOTE — ED Notes (Signed)
Pt asleep in bed. Respirations even and unlabored. Will continue to monitor for safety. ?

## 2020-11-20 NOTE — Group Note (Signed)
Group Topic: Overcoming Obstacles  Group Date: 11/20/2020 Start Time: [place] End Time: 1130 Facilitators: Leana Roe  Department: [facility]  Number of Participants: 1  Group Focus: goals/reality orientation Treatment Modality:  Individual Therapy Interventions utilized were exploration, patient education, and problem solving Purpose: enhance coping skills  Name: Krystal [name] Date of Birth: April 15, 1990  MR: [id number]    Level of Participation: active Quality of Participation: cooperative and offered feedback Interactions with others: gave feedback Mood/Affect: appropriate Triggers (if applicable): N/A Cognition: coherent/clear Progress: Minimal Response: N/A Plan: follow-up needed  Patients Problems:  Patient Active Problem List   Diagnosis Date Noted   MDD (major depressive disorder), recurrent episode, severe ([place]) 11/19/2020   Amenorrhea 05/11/2020   Fibromyalgia 05/11/2020   Gastroesophageal reflux disease 05/11/2020   Interstitial cystitis 05/11/2020   Mild recurrent major depression ([place]) 05/11/2020   Posttraumatic stress disorder 05/11/2020   Primary insomnia 05/11/2020   Essential hypertension 05/11/2020   Acute acalculous cholecystitis s/p lap cholecystectomy 05/11/2020 05/11/2020   Intractable right upper quadrant abdominal pain 05/08/2020   Hypokalemia 05/08/2020   Hypertensive urgency 05/08/2020   Postpartum depression 11/30/2019   Hypothyroidism due to Hashimoto's thyroiditis 10/03/2019   Anxiety 06/16/2019   Asthma 06/07/2019   History of uterine scar from previous surgery 06/07/2019   Obesity 06/07/2019   History of HELLP syndrome, currently pregnant 04/21/2019   Depression during pregnancy 07/20/2018   Lumbar spondylosis 06/23/2018   Sacroiliac inflammation ([place]) 06/23/2018   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   History of upper limb amputation, wrist, 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   Nexplanon insertion 01/19/2018

## 2020-11-20 NOTE — Progress Notes (Signed)
Behavioral Health Progress Note  Date and Time: 11/20/2020 11:04 AM Name: AULANI SHIPTON MRN:  [id number]  Subjective:    Patient states her mood today is "not very good." Patient has been tearful and endorses "feeling depressed about everything." She states she slept poorly overnight due to increased anxiety surrounding recent life stressors and being away from her two young children. She continues to deny SI/HI/AVH and first rank symptoms this morning. Patient states her appetite continues to be poor but she recognizes that she needs to eat and has been forcing herself to eat during meals.   Patient continues to complain of headache that began yesterday afternoon. Patient states she is also experiencing associated symptoms including photophobia, blurry vision in left eye, and nausea. As of this morning she is no longer complaining of nausea and states headache is not resolved by tylenol or ice pack.  Patient states headache is different to her usual migraine headaches and states she believes this is likely due to caffeine withdrawal as she drink "lots of coffee to keep up" with her children. Patient was given coffee this AM and per RN notes, headache has resolved since. Patient states L eye blurriness is normal for her while having headaches.  Patient also endorses dizziness that comes and goes and is worse when up and walking around. Patient states when she feels dizzy her heart begins to pound. Patient denies chest pain and SOB. Patient states she has not been hydrating well, only "taking sips of water" because of fear of getting sicker as she has in the past with previous headaches. She was encouraged to continue hydrating well. Patient would like to go home today to her children and believes this will help improve her mood and anxiety. Patient requested PHP/IOP follow-up appointments and marriage counseling resources before discharge.   Spoke with Adrea Sherpa, patients husband, 11/20/20,  obtained the following collateral information:  Patients husband states Sheryle has frequent crying spells and then collapses and becomes inconsolable. In these moment he removes himself and other stressors from the room she is in. He states "Elder Negus has an issue with holding past trauma, has an issue letting it go. The last couple of weeks we had a few issues, she has a hard time interpreting what is going on. She takes statements as insults or threats, for instance, the cat. I told her 'I really hope its not the cat, other family members have allergies, I don't want to think about getting rid of her.' She thought I was secretly going to get rid of it because her father used to get rid of her pets.You have to watch what you say and phrase how you say it. I've even told my family this. She takes a lot of things personally then spirals and works herself up." Patients husband states her baseline is at a 5-6/10 anxiety and has only gotten worse after their recent miscarriage. He believes recent increase in anxiety and depression are also likely due to recent medication changes and coming of of xanax. Patients husband is agreeable to marriage counseling  and states "If its going to help her, I am willing to engage in it. I think it will be a good thing for [place]."  Diagnosis:  Final diagnoses:  MDD (major depressive disorder), recurrent severe, without psychosis ([place])    Total Time spent with patient: 1 hour  Past Psychiatric History:  Patient endorses past history of MDD, post-partum depression, anxiety, PTSD, and ADHD. Patient has never had inpatient  treatment. Patient states she has "been in and out of therapy her whole life." Patient has seen outpatient trauma counselor for hand amputation and grief counselor for recent miscarriage.   Past Medical History:  Past Medical History:  Diagnosis Date   Anxiety    Asthma    HELLP (hemolytic anemia/elev liver enzymes/low platelets in pregnancy) 12/08/2017    Hypertension    IC (interstitial cystitis)    Kidney stones    Migraines    Severe uncontrolled hypertension 10/08/2017   Formatting of this note might be different from the original. Diagnosed at age 5 years Blood pressure control this pregnancy has been challenging with continued severe range blood pressures at home and on prenatal visits  Current regimen: hydralazine 75 mg TID, amlodipine 10mg  daily, coreg 25mg  BID Previous medications including: lisinopril, HCTZ, amlodipine, labetalol  Intolerance to Procardia a   UTI (urinary tract infection)     Past Surgical History:  Procedure Laterality Date   APPENDECTOMY     BIOPSY  05/10/2020   Procedure: BIOPSY;  Surgeon: Arta Silence, MD;  Location: [facility];  Service: Endoscopy;;   CESAREAN SECTION N/A 12/06/2017   Procedure: CESAREAN SECTION;  Surgeon: Sloan Leiter, MD;  Location: [place];  Service: Obstetrics;  Laterality: N/A;   CHOLECYSTECTOMY N/A 05/11/2020   Procedure: SINGLE SITE LAPAROSCOPIC CHOLECYSTECTOMY AND LIVER BIOSY;  Surgeon: Michael Boston, MD;  Location: [facility];  Service: General;  Laterality: N/A;   ESOPHAGOGASTRODUODENOSCOPY (EGD) WITH PROPOFOL N/A 05/10/2020   Procedure: ESOPHAGOGASTRODUODENOSCOPY (EGD) WITH PROPOFOL;  Surgeon: Arta Silence, MD;  Location: [facility];  Service: Endoscopy;  Laterality: N/A;   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   HAND RECONSTRUCTION Right    INCISION AND DRAINAGE     NASAL SEPTUM SURGERY     Family History:  Family History  Problem Relation Age of Onset   Hypertension Mother    Hypertension Father    Family Psychiatric  History:  Depression and anxiety - three siblings (older brothers and sister) Schizophrenia -distant aunt  Social History:  Social History   Substance and Sexual Activity  Alcohol Use Yes   Comment: bottle of wine a [place]     Social History   Substance and Sexual Activity  Drug Use No    Social History    Socioeconomic History   Marital status: Married    Spouse name: Not on file   Number of children: Not on file   Years of education: Not on file   Highest education level: Not on file  Occupational History   Not on file  Tobacco Use   Smoking status: Never   Smokeless tobacco: Never   Tobacco comments:    She tried a cigarette and never smoked again  Vaping Use   Vaping Use: Never used  Substance and Sexual Activity   Alcohol use: Yes    Comment: bottle of wine a week-occ   Drug use: No   Sexual activity: Not on file  Other Topics Concern   Not on file  Social History Narrative   Not on file   Social Determinants of Health   Financial Resource Strain: Not on file  Food Insecurity: Food Insecurity Present   Worried About [place] in the Last Year: Sometimes true   Ran Out of Food in the Last Year: Never true  Transportation Needs: Unmet Transportation Needs   Lack of Transportation (Medical): Yes   Lack of Transportation (  Non-Medical): Yes  Physical Activity: Not on file  Stress: Not on file  Social Connections: Not on file   SDOH:  SDOH Screenings   Alcohol Screen: Not on file  Depression (PHQ2-9): Medium Risk   PHQ-2 Score: 19  Financial Resource Strain: Not on file  Food Insecurity: Food Insecurity Present   Worried About Charity fundraiser in the Last Year: Sometimes true   Arboriculturist in the Last Year: Never true  Housing: Not on file  Physical Activity: Not on file  Social Connections: Not on file  Stress: Not on file  Tobacco Use: Low Risk    Smoking Tobacco Use: Never   Smokeless Tobacco Use: Never  Transportation Needs: Unmet Transportation Needs   Lack of Transportation (Medical): Yes   Lack of Transportation (Non-Medical): Yes   Additional Social History:  Patient lives with her husband and two younger daughters aged 14 and 29, [place] and [place]. Patient has been unemployed for 6 years now due to starting a family. She feels as  though getting a part time job may be helpful as she reports finances as a source of marital conflict. Patients highest level of education was completion of HS in [place]. Patient denies regular use of cigarettes but endorses occassionally puff of husbands cigarette when she is anxious.   Sleep: Poor  Appetite:  Poor  Current Medications:  Current Facility-Administered Medications  Medication Dose Route Frequency Provider Last Rate Last Admin   acetaminophen (TYLENOL) tablet 650 mg  650 mg Oral Q6H PRN Ival Bible, MD   650 mg at 11/20/20 0631   albuterol (VENTOLIN HFA) 108 (90 Base) MCG/ACT inhaler 2 puff  2 puff Inhalation Q6H PRN Ival Bible, MD       alum & mag hydroxide-simeth (MAALOX/MYLANTA) 200-200-20 MG/5ML suspension 30 mL  30 mL Oral Q4H PRN Ajibola, Ene A, NP       amLODipine (NORVASC) tablet 10 mg  10 mg Oral Daily Ival Bible, MD   10 mg at 11/20/20 3710   hydrALAZINE (APRESOLINE) tablet 100 mg  100 mg Oral Q8H Ajibola, Ene A, NP   100 mg at 11/20/20 0747   hydrOXYzine (ATARAX/VISTARIL) tablet 25 mg  25 mg Oral TID PRN Ajibola, Ene A, NP   25 mg at 11/19/20 1211   levothyroxine (SYNTHROID) tablet 112 mcg  112 mcg Oral QAC breakfast Ival Bible, MD   112 mcg at 11/20/20 6269   magnesium hydroxide (MILK OF MAGNESIA) suspension 30 mL  30 mL Oral Daily PRN Ajibola, Ene A, NP       ondansetron (ZOFRAN-ODT) disintegrating tablet 8 mg  8 mg Oral Once Lindon Romp A, NP       prazosin (MINIPRESS) capsule 1 mg  1 mg Oral QHS Ival Bible, MD   1 mg at 11/19/20 2103   traZODone (DESYREL) tablet 50 mg  50 mg Oral QHS PRN Ajibola, Ene A, NP       venlafaxine XR (EFFEXOR-XR) 24 hr capsule 150 mg  150 mg Oral Daily Ival Bible, MD   150 mg at 11/20/20 4854   Current Outpatient Medications  Medication Sig Dispense Refill   acetaminophen (TYLENOL) 500 MG tablet Take 2 tablets (1,000 mg total) by mouth every 6 (six) hours as needed. 30 tablet  0   albuterol (PROVENTIL HFA;VENTOLIN HFA) 108 (90 Base) MCG/ACT inhaler Inhale 2 puffs into the lungs every 6 (six) hours as needed for wheezing or shortness of  breath.     amLODipine (NORVASC) 10 MG tablet Take 1 tablet (10 mg total) by mouth daily. 30 tablet 2   buPROPion (WELLBUTRIN XL) 150 MG 24 hr tablet Take 150 mg by mouth every morning.     cetirizine (ZYRTEC) 10 MG tablet Take 10 mg by mouth daily.     etonogestrel-ethinyl estradiol (NUVARING) 0.12-0.015 MG/24HR vaginal ring Insert vaginally and leave in place for 3 consecutive weeks, then remove for 1 week. 1 each 12   hydrALAZINE (APRESOLINE) 100 MG tablet Take 1 tablet (100 mg total) by mouth 3 (three) times daily. 90 tablet 1   hydrOXYzine (ATARAX/VISTARIL) 25 MG tablet Take 25 mg by mouth 3 (three) times daily.     levothyroxine (SYNTHROID) 112 MCG tablet Take 112 mcg by mouth daily before breakfast.     Prenatal Vit-Fe Fumarate-FA (PRENATAL PO) Take 1 tablet by mouth daily.     traMADol (ULTRAM) 50 MG tablet Take 50-100 mg by mouth 3 (three) times daily as needed for moderate pain.     venlafaxine XR (EFFEXOR-XR) 150 MG 24 hr capsule Take 150 mg by mouth daily.      Labs  Lab Results:  Admission on 11/18/2020, Discharged on 11/18/2020  Component Date Value Ref Range Status   WBC 11/18/2020 9.2  4.0 - 10.5 K/uL Final   RBC 11/18/2020 4.97  3.87 - 5.11 MIL/uL Final   Hemoglobin 11/18/2020 15.9 (A) 12.0 - 15.0 g/dL Final   HCT 11/18/2020 45.5  36.0 - 46.0 % Final   MCV 11/18/2020 91.5  80.0 - 100.0 fL Final   MCH 11/18/2020 32.0  26.0 - 34.0 pg Final   MCHC 11/18/2020 34.9  30.0 - 36.0 g/dL Final   RDW 11/18/2020 12.6  11.5 - 15.5 % Final   Platelets 11/18/2020 356  150 - 400 K/uL Final   nRBC 11/18/2020 0.0  0.0 - 0.2 % Final   Performed at [facility], [place] [address], [place], [place] [address]   Color, Urine 11/18/2020 YELLOW (A) YELLOW Final   APPearance 11/18/2020 CLEAR (A) CLEAR Final   Specific  Gravity, Urine 11/18/2020 1.020  1.005 - 1.030 Final   pH 11/18/2020 7.0  5.0 - 8.0 Final   Glucose, UA 11/18/2020 NEGATIVE  NEGATIVE mg/dL Final   Hgb urine dipstick 11/18/2020 SMALL (A) NEGATIVE Final   Bilirubin Urine 11/18/2020 NEGATIVE  NEGATIVE Final   Ketones, ur 11/18/2020 NEGATIVE  NEGATIVE mg/dL Final   Protein, ur 11/18/2020 NEGATIVE  NEGATIVE mg/dL Final   Nitrite 11/18/2020 NEGATIVE  NEGATIVE Final   Leukocytes,Ua 11/18/2020 NEGATIVE  NEGATIVE Final   RBC / HPF 11/18/2020 6-10  0 - 5 RBC/hpf Final   WBC, UA 11/18/2020 0-5  0 - 5 WBC/hpf Final   Bacteria, UA 11/18/2020 RARE (A) NONE SEEN Final   Squamous Epithelial / LPF 11/18/2020 0-5  0 - 5 Final   Mucus 11/18/2020 PRESENT   Final   Performed at [facility], [place] [address] [place], [place] [address]   Glucose-Capillary 11/18/2020 100 (A) 70 - 99 mg/dL Final   Glucose reference range applies only to samples taken after fasting for at least 8 hours.   I-stat hCG, quantitative 11/18/2020 <5.0  <5 mIU/mL Final   Comment 3 11/18/2020          Final   Comment:   GEST. AGE      CONC.  (mIU/mL)   <=1 WEEK        5 -  50     2 WEEKS       50 - 500     3 WEEKS       100 - 10,000     4 WEEKS     1,000 - 30,000        FEMALE AND NON-PREGNANT FEMALE:     LESS THAN 5 mIU/mL    Alcohol, Ethyl (B) 11/18/2020 <10  <10 mg/dL Final   Comment: (NOTE) Lowest detectable limit for serum alcohol is 10 mg/dL.  For medical purposes only. Performed at [facility], [place] [address] [place], [place] [address]    Salicylate Lvl 42/35/3614 <7.0 (A) 7.0 - 30.0 mg/dL Final   Performed at [place] [address] [place], [place] [address]   Acetaminophen (Tylenol), Serum 11/18/2020 <10 (A) 10 - 30 ug/mL Final   Comment: (NOTE) Therapeutic concentrations vary significantly. A range of 10-30 ug/mL  may be an effective concentration for many patients. However, some  are best treated at  concentrations outside of this range. Acetaminophen concentrations >150 ug/mL at 4 hours after ingestion  and >50 ug/mL at 12 hours after ingestion are often associated with  toxic reactions.  Performed at [facility], [place] [address] [place], [place] [address]    Opiates 11/18/2020 NONE DETECTED  NONE DETECTED Final   Cocaine 11/18/2020 NONE DETECTED  NONE DETECTED Final   Benzodiazepines 11/18/2020 POSITIVE (A) NONE DETECTED Final   Amphetamines 11/18/2020 NONE DETECTED  NONE DETECTED Final   Tetrahydrocannabinol 11/18/2020 POSITIVE (A) NONE DETECTED Final   Barbiturates 11/18/2020 NONE DETECTED  NONE DETECTED Final   Comment: (NOTE) DRUG SCREEN FOR MEDICAL PURPOSES ONLY.  IF CONFIRMATION IS NEEDED FOR ANY PURPOSE, NOTIFY LAB WITHIN 5 DAYS.  LOWEST DETECTABLE LIMITS FOR URINE DRUG SCREEN Drug Class                     Cutoff (ng/mL) Amphetamine and metabolites    1000 Barbiturate and metabolites    200 Benzodiazepine                 619 Tricyclics and metabolites     300 Opiates and metabolites        300 Cocaine and metabolites        300 THC                            50 Performed at [facility], [place] [address], [place], [place] [address]    Sodium 11/18/2020 140  135 - 145 mmol/L Final   Potassium 11/18/2020 3.0 (A) 3.5 - 5.1 mmol/L Final   Chloride 11/18/2020 106  98 - 111 mmol/L Final   CO2 11/18/2020 27  22 - 32 mmol/L Final   Glucose, Bld 11/18/2020 94  70 - 99 mg/dL Final   Glucose reference range applies only to samples taken after fasting for at least 8 hours.   BUN 11/18/2020 13  6 - 20 mg/dL Final   Creatinine, Ser 11/18/2020 0.77  0.44 - 1.00 mg/dL Final   Calcium 11/18/2020 9.5  8.9 - 10.3 mg/dL Final   Total Protein 11/18/2020 7.6  6.5 - 8.1 g/dL Final   Albumin 11/18/2020 4.6  3.5 - 5.0 g/dL Final   AST 11/18/2020 21  15 - 41 U/L Final   ALT 11/18/2020 25  0 - 44 U/L Final   Alkaline Phosphatase 11/18/2020 59  38  -  126 U/L Final   Total Bilirubin 11/18/2020 0.6  0.3 - 1.2 mg/dL Final   GFR, Estimated 11/18/2020 >60  >60 mL/min Final   Comment: (NOTE) Calculated using the CKD-EPI Creatinine Equation (2021)    Anion gap 11/18/2020 7  5 - 15 Final   Performed at [facility], [place] [address], [place],  [address]   SARS Coronavirus 2 by RT PCR 11/18/2020 NEGATIVE  NEGATIVE Final   Comment: (NOTE) SARS-CoV-2 target nucleic acids are NOT DETECTED.  The SARS-CoV-2 RNA is generally detectable in upper respiratory specimens during the acute phase of infection. The lowest concentration of SARS-CoV-2 viral copies this assay can detect is 138 copies/mL. A negative result does not preclude SARS-Cov-2 infection and should not be used as the sole basis for treatment or other patient management decisions. A negative result may occur with  improper specimen collection/handling, submission of specimen other than nasopharyngeal swab, presence of viral mutation(s) within the areas targeted by this assay, and inadequate number of viral copies(<138 copies/mL). A negative result must be combined with clinical observations, patient history, and epidemiological information. The expected result is Negative.  Fact Sheet for Patients:  EntrepreneurPulse.com.au  Fact Sheet for Healthcare Providers:  IncredibleEmployment.be  This test is no                          t yet approved or cleared by the [place] FDA and  has been authorized for detection and/or diagnosis of SARS-CoV-2 by FDA under an Emergency Use Authorization (EUA). This EUA will remain  in effect (meaning this test can be used) for the duration of the COVID-19 declaration under Section 564(b)(1) of the Act, 21 U.S.C.section 360bbb-3(b)(1), unless the authorization is terminated  or revoked sooner.       Influenza A by PCR 11/18/2020 NEGATIVE  NEGATIVE Final   Influenza B by PCR  11/18/2020 NEGATIVE  NEGATIVE Final   Comment: (NOTE) The Xpert Xpress SARS-CoV-2/FLU/RSV plus assay is intended as an aid in the diagnosis of influenza from Nasopharyngeal swab specimens and should not be used as a sole basis for treatment. Nasal washings and aspirates are unacceptable for Xpert Xpress SARS-CoV-2/FLU/RSV testing.  Fact Sheet for Patients: EntrepreneurPulse.com.au  Fact Sheet for Healthcare Providers: IncredibleEmployment.be  This test is not yet approved or cleared by the [place] FDA and has been authorized for detection and/or diagnosis of SARS-CoV-2 by FDA under an Emergency Use Authorization (EUA). This EUA will remain in effect (meaning this test can be used) for the duration of the COVID-19 declaration under Section 564(b)(1) of the Act, 21 U.S.C. section 360bbb-3(b)(1), unless the authorization is terminated or revoked.  Performed at [facility], [place] [address] [place],  [address]   Admission on 09/19/2020, Discharged on 09/19/2020  Component Date Value Ref Range Status   Sodium 09/19/2020 136  135 - 145 mmol/L Final   Potassium 09/19/2020 3.9  3.5 - 5.1 mmol/L Final   Chloride 09/19/2020 107  98 - 111 mmol/L Final   CO2 09/19/2020 21 (A) 22 - 32 mmol/L Final   Glucose, Bld 09/19/2020 91  70 - 99 mg/dL Final   Glucose reference range applies only to samples taken after fasting for at least 8 hours.   BUN 09/19/2020 14  6 - 20 mg/dL Final   Creatinine, Ser 09/19/2020 0.73  0.44 - 1.00 mg/dL Final   Calcium 09/19/2020 9.4  8.9 - 10.3 mg/dL Final  GFR, Estimated 09/19/2020 >60  >60 mL/min Final   Comment: (NOTE) Calculated using the CKD-EPI Creatinine Equation (2021)    Anion gap 09/19/2020 8  5 - 15 Final   Performed at [facility], [place] [address] [place], [place] [address]   WBC 09/19/2020 7.1  4.0 - 10.5 K/uL Final   Comment: REPEATED TO VERIFY WHITE COUNT CONFIRMED ON  SMEAR    RBC 09/19/2020 4.86  3.87 - 5.11 MIL/uL Final   Hemoglobin 09/19/2020 15.3 (A) 12.0 - 15.0 g/dL Final   HCT 09/19/2020 44.5  36.0 - 46.0 % Final   MCV 09/19/2020 91.6  80.0 - 100.0 fL Final   MCH 09/19/2020 31.5  26.0 - 34.0 pg Final   MCHC 09/19/2020 34.4  30.0 - 36.0 g/dL Final   RDW 09/19/2020 12.6  11.5 - 15.5 % Final   Platelets 09/19/2020 137 (A) 150 - 400 K/uL Final   Comment: REPEATED TO VERIFY PLATELET COUNT CONFIRMED BY SMEAR    nRBC 09/19/2020 0.0  0.0 - 0.2 % Final   Neutrophils Relative % 09/19/2020 58  % Final   Neutro Abs 09/19/2020 4.1  1.7 - 7.7 K/uL Final   Lymphocytes Relative 09/19/2020 32  % Final   Lymphs Abs 09/19/2020 2.3  0.7 - 4.0 K/uL Final   Monocytes Relative 09/19/2020 8  % Final   Monocytes Absolute 09/19/2020 0.6  0.1 - 1.0 K/uL Final   Eosinophils Relative 09/19/2020 0  % Final   Eosinophils Absolute 09/19/2020 0.0  0.0 - 0.5 K/uL Final   Basophils Relative 09/19/2020 1  % Final   Basophils Absolute 09/19/2020 0.1  0.0 - 0.1 K/uL Final   Immature Granulocytes 09/19/2020 1  % Final   Abs Immature Granulocytes 09/19/2020 0.05  0.00 - 0.07 K/uL Final   Performed at [facility], [place] [address], [place], [place] [address]   Troponin I (High Sensitivity) 09/19/2020 5  <18 ng/L Final   Comment: (NOTE) Elevated high sensitivity troponin I (hsTnI) values and significant  changes across serial measurements may suggest ACS but many other  chronic and acute conditions are known to elevate hsTnI results.  Refer to the "Links" section for chest pain algorithms and additional  guidance. Performed at [facility], [place] [address] [place], [place] [address]    Troponin I (High Sensitivity) 09/19/2020 6  <18 ng/L Final   Comment: (NOTE) Elevated high sensitivity troponin I (hsTnI) values and significant  changes across serial measurements may suggest ACS but many other  chronic and acute conditions are known to elevate hsTnI results.   Refer to the "Links" section for chest pain algorithms and additional  guidance. Performed at [facility], [place] [address] [place], [place] [address]   Admission on 08/26/2020, Discharged on 08/26/2020  Component Date Value Ref Range Status   hCG, Beta Chain, Quant, S 08/26/2020 650 (A) <5 mIU/mL Final   Comment:          GEST. AGE      CONC.  (mIU/mL)   <=1 WEEK        5 - 50     2 WEEKS       50 - 500     3 WEEKS       100 - 10,000     4 WEEKS     1,000 - 30,000     5 WEEKS     3,500 - 115,000   6-8 WEEKS  12,000 - 270,000    12 WEEKS     15,000 - 220,000        FEMALE AND NON-PREGNANT FEMALE:     LESS THAN 5 mIU/mL Performed at [facility], [place] [address] [place], [place] [address]    Color, Urine 08/26/2020 YELLOW  YELLOW Final   APPearance 08/26/2020 HAZY (A) CLEAR Final   Specific Gravity, Urine 08/26/2020 1.019  1.005 - 1.030 Final   pH 08/26/2020 7.0  5.0 - 8.0 Final   Glucose, UA 08/26/2020 NEGATIVE  NEGATIVE mg/dL Final   Hgb urine dipstick 08/26/2020 LARGE (A) NEGATIVE Final   Bilirubin Urine 08/26/2020 NEGATIVE  NEGATIVE Final   Ketones, ur 08/26/2020 NEGATIVE  NEGATIVE mg/dL Final   Protein, ur 08/26/2020 30 (A) NEGATIVE mg/dL Final   Nitrite 08/26/2020 NEGATIVE  NEGATIVE Final   Leukocytes,Ua 08/26/2020 TRACE (A) NEGATIVE Final   RBC / HPF 08/26/2020 >50 (A) 0 - 5 RBC/hpf Final   WBC, UA 08/26/2020 11-20  0 - 5 WBC/hpf Final   Bacteria, UA 08/26/2020 RARE (A) NONE SEEN Final   Squamous Epithelial / LPF 08/26/2020 0-5  0 - 5 Final   Mucus 08/26/2020 PRESENT   Final   Performed at [facility], [place] [address] [place], [place] [address]   Specimen Description 08/26/2020 Clelia Croft CATCH   Final   Special Requests 08/26/2020 NONE   Final   Culture 08/26/2020    Final                   Value:NO GROWTH NO GROUP B STREP (S.AGALACTIAE) ISOLATED Performed at [facility], [place] [address] [place], [place] [address]    Report Status  08/26/2020 08/27/2020 FINAL   Final  Admission on 08/23/2020, Discharged on 08/24/2020  Component Date Value Ref Range Status   Preg Test, Ur 08/23/2020 POSITIVE (A) NEGATIVE Final   Comment:        THE SENSITIVITY OF THIS METHODOLOGY IS >20 mIU/mL. Performed at [facility], [place] [address], [place], [place] [address]    WBC 08/23/2020 5.6  4.0 - 10.5 K/uL Final   RBC 08/23/2020 4.38  3.87 - 5.11 MIL/uL Final   Hemoglobin 08/23/2020 13.7  12.0 - 15.0 g/dL Final   HCT 08/23/2020 40.4  36.0 - 46.0 % Final   MCV 08/23/2020 92.2  80.0 - 100.0 fL Final   MCH 08/23/2020 31.3  26.0 - 34.0 pg Final   MCHC 08/23/2020 33.9  30.0 - 36.0 g/dL Final   RDW 08/23/2020 12.4  11.5 - 15.5 % Final   Platelets 08/23/2020 243  150 - 400 K/uL Final   nRBC 08/23/2020 0.0  0.0 - 0.2 % Final   Performed at [place] [address] [place],  [address]   hCG, Levy Sjogren, S 08/23/2020 3,067 (A) <5 mIU/mL Final   Comment:          GEST. AGE      CONC.  (mIU/mL)   <=1 WEEK        5 - 50     2 WEEKS       50 - 500     3 WEEKS       100 - 10,000     4 WEEKS     1,000 - 30,000     5 WEEKS     3,500 - 115,000   6-8 WEEKS     12,000 - 270,000  12 WEEKS     15,000 - 220,000        FEMALE AND NON-PREGNANT FEMALE:     LESS THAN 5 mIU/mL Performed at [facility], [place] [address], [place], [place] [address]    ABO/RH(D) 08/23/2020 O POS   Final   No rh immune globuloin 08/23/2020    Final                   Value:NOT A RH IMMUNE GLOBULIN CANDIDATE, PT RH POSITIVE Performed at [facility], [place] [address], [place], [place] [address]    Yeast Wet Prep HPF POC 08/23/2020 NONE SEEN  NONE SEEN Final   Trich, Wet Prep 08/23/2020 NONE SEEN  NONE SEEN Final   Clue Cells Wet Prep HPF POC 08/23/2020 NONE SEEN  NONE SEEN Final   WBC, Wet Prep HPF POC 08/23/2020 FEW (A) NONE SEEN Final   Sperm 08/23/2020 NONE SEEN   Final   Performed at [facility], [place] [address]  [place], [place] [address]   Neisseria Gonorrhea 08/23/2020 Negative   Final   Chlamydia 08/23/2020 Negative   Final   Comment 08/23/2020 Normal Reference [place] Chlamydia - Negative   Final   Comment 08/23/2020 Normal Reference Range Neisseria Gonorrhea - Negative   Final   Sodium 08/23/2020 138  135 - 145 mmol/L Final   Potassium 08/23/2020 3.2 (A) 3.5 - 5.1 mmol/L Final   Chloride 08/23/2020 103  98 - 111 mmol/L Final   CO2 08/23/2020 25  22 - 32 mmol/L Final   Glucose, Bld 08/23/2020 96  70 - 99 mg/dL Final   Glucose reference range applies only to samples taken after fasting for at least 8 hours.   BUN 08/23/2020 <5 (A) 6 - 20 mg/dL Final   Creatinine, Ser 08/23/2020 0.68  0.44 - 1.00 mg/dL Final   Calcium 08/23/2020 8.9  8.9 - 10.3 mg/dL Final   Total Protein 08/23/2020 6.6  6.5 - 8.1 g/dL Final   Albumin 08/23/2020 4.2  3.5 - 5.0 g/dL Final   AST 08/23/2020 48 (A) 15 - 41 U/L Final   ALT 08/23/2020 60 (A) 0 - 44 U/L Final   Alkaline Phosphatase 08/23/2020 54  38 - 126 U/L Final   Total Bilirubin 08/23/2020 0.6  0.3 - 1.2 mg/dL Final   GFR, Estimated 08/23/2020 >60  >60 mL/min Final   Comment: (NOTE) Calculated using the CKD-EPI Creatinine Equation (2021)    Anion gap 08/23/2020 10  5 - 15 Final   Performed at [facility] [address], [place], [place] [address]   Creatinine, Urine 08/23/2020 89.38  mg/dL Final   Total Protein, Urine 08/23/2020 19  mg/dL Final   NO NORMAL RANGE ESTABLISHED FOR THIS TEST   Protein Creatinine Ratio 08/23/2020 0.21 (A) 0.00 - 0.15 mg/mg[Cre] Final   Performed at [facility] [address], [place],  [address]    Blood Alcohol level:  Lab Results  Component Value Date   ETH <10 54/65/6812    Metabolic Disorder Labs: No results found for: HGBA1C, MPG No results found for: PROLACTIN No results found for: CHOL, TRIG, HDL, CHOLHDL, VLDL, LDLCALC  Therapeutic Lab Levels: No results found for: LITHIUM No results found for:  VALPROATE No components found for:  CBMZ  Physical Findings   GAD-7    Flowsheet Row Office Visit from 10/09/2020 in [facility] for [employer] at [facility] Office Visit  from 09/26/2020 in [place] for [place] at [facility] Clinical Support from 12/22/2017 in Center for [facility]  Total GAD-7 Score 17 20 3       PHQ2-9    [place] Office Visit from 10/09/2020 in [facility] for [place] at [facility] Office Visit from 09/26/2020 in [place] for [place] at [facility] for [place] from 12/22/2017 in [place] for [facility]  PHQ-2 Total Score 5 5 2   PHQ-9 Total Score 19 20 8       [place] ED from 11/18/2020 in [facility] Most recent reading at 11/19/2020 12:15 AM ED from 11/18/2020 in [place] DEPT Most recent reading at 11/18/2020  4:08 PM ED from 11/12/2020 in [facility] at [facility] Most recent reading at 11/12/2020  9:52 AM  C-SSRS RISK CATEGORY Low Risk No Risk No Risk        Musculoskeletal  Strength & Muscle Tone: within normal limits Gait & Station: normal Patient leans: N/A  Psychiatric Specialty Exam  Presentation  General Appearance: Appropriate for Environment; Fairly Groomed  Eye Contact:Good  Speech:Clear and Coherent; Normal Rate  Speech Volume:Normal  Handedness:Right   Mood and Affect  Mood:Anxious; Depressed  Affect:Appropriate; Congruent; Tearful (appropriately tearful at times when discussing her marriage)   Thought Process  Thought Processes:Coherent; Goal Directed; Other (comment)  Descriptions of Associations:Intact  Orientation:Full (Time, Place and Person)  Thought Content:WDL; Logical  Diagnosis of Schizophrenia or Schizoaffective disorder in past: No    Hallucinations:Hallucinations: None  Ideas of  Reference:None  Suicidal Thoughts:Suicidal Thoughts: No SI Passive Intent and/or Plan: Without Intent; Without Plan  Homicidal Thoughts:Homicidal Thoughts: No   Sensorium  Memory:Immediate Good; Recent Good; Remote Good  Judgment:Good  Insight:Good   Executive Functions  Concentration:Good  Attention Span:Good  [place] of Knowledge:Good  Language:Good   Psychomotor Activity  Psychomotor Activity:Psychomotor Activity: Normal   Assets  Assets:Communication Skills; Desire for Improvement; Financial Resources/Insurance; Housing; Intimacy; Resilience   Sleep  Sleep:Sleep: Fair   No data recorded  Physical Exam  Physical Exam Vitals and nursing note reviewed.  Constitutional:      Appearance: Normal appearance.  HENT:     Head: Normocephalic and atraumatic.  Neck:     Comments: Muscle tenderness in neck from patients recent fall Cardiovascular:     Rate and Rhythm: Normal rate and regular rhythm.     Heart sounds: Normal heart sounds.  Pulmonary:     Effort: Pulmonary effort is normal. No respiratory distress.     Breath sounds: Normal breath sounds. No wheezing.  Abdominal:     General: Abdomen is flat. Bowel sounds are normal.     Palpations: Abdomen is soft.  Musculoskeletal:        General: Normal range of motion.     Cervical back: Normal range of motion and neck supple.     Comments: L arm amputated, significant scarring on R hand/arm from previous surgery  Skin:    General: Skin is warm and dry.  Neurological:     General: No focal deficit present.     Mental Status: She is alert and oriented to person, place, and time.  Psychiatric:        Thought Content: Thought content normal.        Judgment: Judgment normal.   Review of Systems  Constitutional: Negative.   HENT:  Positive for tinnitus.  Eyes:  Positive for blurred vision and photophobia.       Patient experiencing L eye blurred vision and photophobia due to headache.   Respiratory:  Negative for shortness of breath.   Cardiovascular:  Negative for chest pain and palpitations.  Gastrointestinal:  Negative for abdominal pain, nausea and vomiting.  Genitourinary: Negative.   Musculoskeletal:  Positive for back pain.  Skin: Negative.   Neurological:  Positive for dizziness and headaches.       Headache likely due to caffiene withdrawal and poor hydration  Psychiatric/Behavioral:  Positive for depression. Negative for hallucinations and suicidal ideas. The patient is nervous/anxious.   Blood pressure (!) 157/107, pulse 97, temperature 97.6 F (36.4 C), temperature source Oral, resp. rate 16, SpO2 98 %. There is no height or weight on file to calculate BMI.  Treatment Plan Summary: Rosmery C. Bogden is a 29yo with past medical history of  hashimoto's disease, hypertension, fibromyalgia, hypertension, MDD, post-partum depression, anxiety, PTSD, and ADHD here from [facility] for syncopal episode and SI without plan. Today the patient denies SI, HI, AVH and but continues to endorse depressed mood and increased anxiety surrounding recent life stressors. Remaining inpatient no longer seems therapeutically positive, as the patient is having increased anxiety from being away from her younger children at home and is isolated on the unit. Patient has continuosuly denied SI throughout admission and has asked to be discharged home. Patient is able to contract for safety and will discharge home today with medication regiment as outlined below and appropriate follow-up at PHP/IOP. See discharge summary for more information.     MDD, reccurent, severe, w/o psychosis PTSD -continue venlafaxine 150mg  daily for depressive symptoms -continue hydroxyzine 25mg  TID for anxiety symptoms -continue prazosin 1mg  qhs for PTSD related nightmares   Medical management   Hypertension -continue home medications: amlodipine 10mg  daily, hydralazine 100mg  TID.  -most recent BP 157/107, patient  continues to hover in this range, continue to monitor blood pressures   Fibromyalgia -Tylenol 650mg  PRN for pain   Hashimoto's disease -continue home medication, levothyroxine 179mcg daily   Asthma -no medical management needed at this moment. Continue to monitor patient for symptoms. -PRN albuterol inhaler   Dispo: SW assisted patient with establishing IOP/PHP follow-up appointment and providing resources for outpatient marriage counseling. Patient discharging home today.  Tyna Jaksch, Medical Student 11/20/2020 11:04 AM

## 2020-11-20 NOTE — ED Notes (Signed)
Patient discharged in no acute distress. Denied SI,HI,AVH upon discharge.Verbalized understanding of all discharge instructions, reviewed on the AVS to include resources for follow up care and medications. All belongings returned to patient intact from locker #27. Patient ambulated independently to lobby without issue. Patient discharged in private vehicle with husband. Safety maintained.

## 2020-11-20 NOTE — ED Notes (Addendum)
Pt resting in bed. Cold pack given for headache per pt request. Pt states, "my chest is feeling better, my heart isn't pounding anymore." Pt reports dizziness has improved with rest as well. No signs of acute distress noted. Will continue to monitor for safety.

## 2020-11-20 NOTE — Discharge Instructions (Addendum)
PHP program  MON 10/3 at 3:30 pmis a virtual appointment and you can expect an email 15 min prior to appt time with the link for session. Please call if questions: [phone].

## 2020-11-20 NOTE — ED Notes (Signed)
Patient stated she has no headache after drinking coffee. Will continue to monitor for safety.

## 2020-11-20 NOTE — Progress Notes (Signed)
Patient awake, resting in bed.  Continues to complain of headache, unrelieved by Tylenol.  Denied SI, HI, AVH.  Stated she does see "floaters".  Denied dizziness and nausea this AM.

## 2020-11-20 NOTE — ED Provider Notes (Signed)
FBC/OBS ASAP Discharge Summary  Date and Time: 11/20/2020 10:58 AM  Name: Krystal [name]  MRN:  [id number]   Discharge Diagnoses:  Final diagnoses:  MDD (major depressive disorder), recurrent severe, without psychosis ([place])    Subjective:  Patient seen and chart reviewed. She indicates that her day was ok yesterday up until the evening when she began to experience a headache. She states that her current headahce feels like her "normal" caffeine withdrawal headaches and states that she has not had any coffee since yeterday. She reports that the headache is located at the back of her head. She denies nausea, vomiting. She states that her left eye is blurry and indicates that this is normal for her when she has a caffeine rebound headache. She denies SI/HI/AVH. She is able to contract for safety  Patietn states that she would like to discharge and return home to her children but would like referrals to PHP/IOP prior to discharge. Patient reported resolution of headache and symptoms to other staff members after consuming coffee.   Medical student Gema Rodriguez-Teodoro MS3 obtained patient's consent to call husband. Collateral below from  husband obtained by medical student:   Patients husband states Krystal [name] has frequent crying spells and then collapses and becomes inconsolable. In these moment he removes himself and other stressors from the room she is in. He states "Elder Negus has an issue with holding past trauma, has an issue letting it go. The last couple of weeks we had a few issues, she has a hard time interpreting what is going on. She takes statements as insults or threats, for instance, the cat. I told her 'I really hope its not the cat, other family members have allergies, I don't want to think about getting rid of her.' She thought I was secretly going to get rid of it because her father used to get rid of her pets.You have to watch what you say and phrase how you say it. I've even told my  family this. She takes a lot of things personally then spirals and works herself up." Patients husband states her baseline is at a 5-6/10 anxiety and has only gotten worse after their recent miscarriage. He believes recent increase in anxiety and depression are also likely due to recent medication changes and coming of of xanax. Patients husband is agreeable to marriage counseling  and states "If its going to help her, I am willing to engage in it. I think it will be a good thing for [place]."  Stay Summary:   Krystal [name] is a 30yo with past medical history of  hashimoto's disease, hypertension, fibromyalgia, hypertension, MDD, post-partum depression, anxiety, PTSD, and ADHD here from [facility] for syncopal episode and SI without plan She presented to [facility] post syncopal episodes and with complaint of depression and suicidal ideation on 9/25 Patient was evaluated in the ED and medically clearance- no head imaging was obtained. Per ED MD note, " CT imaging was not performed because she did not meet criteria for cross-sectional imaging of her head.  Symptoms seem to be attributable to the stressful situation, and her anxiety and depression seem to be at the heart of her presentation".  Patient transferred to the [facility] for further treatment. Patient was res-started on wellbutrin and hydralazine 100 mg TID by overnight FBC provider. The following day 9/26 Wellbutrin was discontinued as she had reported that this medication was recently started by her PCP and it has led to increased anxiety. She was continued on  home effexor 150 mg, home norvasc 10 mg, hydroxyzine 25 mg TID, levothyroxine 112 mcg, prn albuterol inhaler  and started on prazosin 1 mg qhs for PTSD related nightmares. Patient's BP was better controlled overall with addition of home norvasc although patient did remain hypertensive throughout stay. Patient reported headache the night of 9/26 and the morning of 9/27; patient described as her normal caffeine  rebound headache when she does not consume caffeine-headache resolved after she consumed 2 cups of coffee. Patient requested discharge on 9/27- see above for details from interview day of discharge. Patient denied SI/HI/AVH and was able to contract for safety. Appointment made with PHP on Monday October 3rd. Discussed with patient that it will be a virtual appointment and that there is a number provided if she has additional questions that will be available in AVS. Pt verbalized understnading.    Patient stated that she did not refills of previously prescribed medications; 30 day rx for prazosin 1 mg sent to pharmacy of choice.    Total Time spent with patient: 20 minutes  Past Psychiatric History: see h&P Past Medical History:  Past Medical History:  Diagnosis Date   Anxiety    Asthma    HELLP (hemolytic anemia/elev liver enzymes/low platelets in pregnancy) 12/08/2017   Hypertension    IC (interstitial cystitis)    Kidney stones    Migraines    Severe uncontrolled hypertension 10/08/2017   Formatting of this note might be different from the original. Diagnosed at age 60 years Blood pressure control this pregnancy has been challenging with continued severe range blood pressures at home and on prenatal visits  Current regimen: hydralazine 75 mg TID, amlodipine 10mg  daily, coreg 25mg  BID Previous medications including: lisinopril, HCTZ, amlodipine, labetalol  Intolerance to Procardia a   UTI (urinary tract infection)     Past Surgical History:  Procedure Laterality Date   APPENDECTOMY     BIOPSY  05/10/2020   Procedure: BIOPSY;  Surgeon: Arta Silence, MD;  Location: [facility];  Service: Endoscopy;;   CESAREAN SECTION N/A 12/06/2017   Procedure: CESAREAN SECTION;  Surgeon: Sloan Leiter, MD;  Location: [facility];  Service: Obstetrics;  Laterality: N/A;   CHOLECYSTECTOMY N/A 05/11/2020   Procedure: SINGLE SITE LAPAROSCOPIC CHOLECYSTECTOMY AND LIVER BIOSY;  Surgeon: Michael Boston, MD;  Location: [facility];  Service: General;  Laterality: N/A;   ESOPHAGOGASTRODUODENOSCOPY (EGD) WITH PROPOFOL N/A 05/10/2020   Procedure: ESOPHAGOGASTRODUODENOSCOPY (EGD) WITH PROPOFOL;  Surgeon: Arta Silence, MD;  Location: [facility];  Service: Endoscopy;  Laterality: N/A;   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   HAND RECONSTRUCTION Right    INCISION AND DRAINAGE     NASAL SEPTUM SURGERY     Family History:  Family History  Problem Relation Age of Onset   Hypertension Mother    Hypertension Father    Family Psychiatric History: seee H&P Social History:  Social History   Substance and Sexual Activity  Alcohol Use Yes   Comment: bottle of wine a week-occ     Social History   Substance and Sexual Activity  Drug Use No    Social History   Socioeconomic History   Marital status: Married    Spouse name: Not on file   Number of children: Not on file   Years of education: Not on file   Highest education level: Not on file  Occupational History   Not on file  Tobacco Use   Smoking status: Never  Smokeless tobacco: Never   Tobacco comments:    She tried a cigarette and never smoked again  Vaping Use   Vaping Use: Never used  Substance and Sexual Activity   Alcohol use: Yes    Comment: bottle of wine a week-occ   Drug use: No   Sexual activity: Not on file  Other Topics Concern   Not on file  Social History Narrative   Not on file   Social Determinants of Health   Financial Resource Strain: Not on file  Food Insecurity: Food Insecurity Present   Worried About Running Out of Food in the Last Year: Sometimes true   Ran Out of Food in the Last Year: Never true  Transportation Needs: Unmet Transportation Needs   Lack of Transportation (Medical): Yes   Lack of Transportation (Non-Medical): Yes  Physical Activity: Not on file  Stress: Not on file  Social Connections: Not on file   SDOH:  SDOH Screenings   Alcohol Screen: Not on file   Depression (PHQ2-9): Medium Risk   PHQ-2 Score: 19  Financial Resource Strain: Not on file  Food Insecurity: Food Insecurity Present   Worried About Charity fundraiser in the Last Year: Sometimes true   Arboriculturist in the Last Year: Never true  Housing: Not on file  Physical Activity: Not on file  Social Connections: Not on file  Stress: Not on file  Tobacco Use: Low Risk    Smoking Tobacco Use: Never   Smokeless Tobacco Use: Never  Transportation Needs: Public librarian (Medical): Yes   Lack of Transportation (Non-Medical): Yes    Tobacco Cessation:  N/A, patient does not currently use tobacco products  Current Medications:  Current Facility-Administered Medications  Medication Dose Route Frequency Provider Last Rate Last Admin   acetaminophen (TYLENOL) tablet 650 mg  650 mg Oral Q6H PRN Ival Bible, MD   650 mg at 11/20/20 0631   albuterol (VENTOLIN HFA) 108 (90 Base) MCG/ACT inhaler 2 puff  2 puff Inhalation Q6H PRN Ival Bible, MD       alum & mag hydroxide-simeth (MAALOX/MYLANTA) 200-200-20 MG/5ML suspension 30 mL  30 mL Oral Q4H PRN Ajibola, Ene A, NP       amLODipine (NORVASC) tablet 10 mg  10 mg Oral Daily Ival Bible, MD   10 mg at 11/20/20 0925   hydrALAZINE (APRESOLINE) tablet 100 mg  100 mg Oral Q8H Ajibola, Ene A, NP   100 mg at 11/20/20 0747   hydrOXYzine (ATARAX/VISTARIL) tablet 25 mg  25 mg Oral TID PRN Ajibola, Ene A, NP   25 mg at 11/19/20 1211   levothyroxine (SYNTHROID) tablet 112 mcg  112 mcg Oral QAC breakfast Ival Bible, MD   112 mcg at 11/20/20 0743   magnesium hydroxide (MILK OF MAGNESIA) suspension 30 mL  30 mL Oral Daily PRN Ajibola, Ene A, NP       ondansetron (ZOFRAN-ODT) disintegrating tablet 8 mg  8 mg Oral Once Lindon Romp A, NP       prazosin (MINIPRESS) capsule 1 mg  1 mg Oral QHS Ival Bible, MD   1 mg at 11/19/20 2103   traZODone (DESYREL) tablet 50 mg  50 mg  Oral QHS PRN Ajibola, Ene A, NP       venlafaxine XR (EFFEXOR-XR) 24 hr capsule 150 mg  150 mg Oral Daily Ival Bible, MD   150 mg at 11/20/20 [phone]  Current Outpatient Medications  Medication Sig Dispense Refill   acetaminophen (TYLENOL) 500 MG tablet Take 2 tablets (1,000 mg total) by mouth every 6 (six) hours as needed. 30 tablet 0   albuterol (PROVENTIL HFA;VENTOLIN HFA) 108 (90 Base) MCG/ACT inhaler Inhale 2 puffs into the lungs every 6 (six) hours as needed for wheezing or shortness of breath.     amLODipine (NORVASC) 10 MG tablet Take 1 tablet (10 mg total) by mouth daily. 30 tablet 2   buPROPion (WELLBUTRIN XL) 150 MG 24 hr tablet Take 150 mg by mouth every morning.     cetirizine (ZYRTEC) 10 MG tablet Take 10 mg by mouth daily.     etonogestrel-ethinyl estradiol (NUVARING) 0.12-0.015 MG/24HR vaginal ring Insert vaginally and leave in place for 3 consecutive weeks, then remove for 1 week. 1 each 12   hydrALAZINE (APRESOLINE) 100 MG tablet Take 1 tablet (100 mg total) by mouth 3 (three) times daily. 90 tablet 1   hydrOXYzine (ATARAX/VISTARIL) 25 MG tablet Take 25 mg by mouth 3 (three) times daily.     levothyroxine (SYNTHROID) 112 MCG tablet Take 112 mcg by mouth daily before breakfast.     Prenatal Vit-Fe Fumarate-FA (PRENATAL PO) Take 1 tablet by mouth daily.     traMADol (ULTRAM) 50 MG tablet Take 50-100 mg by mouth 3 (three) times daily as needed for moderate pain.     venlafaxine XR (EFFEXOR-XR) 150 MG 24 hr capsule Take 150 mg by mouth daily.      PTA Medications: (Not in a hospital admission)   Musculoskeletal  Strength & Muscle Tone: within normal limits Gait & Station: normal Patient leans: N/A  Psychiatric Specialty Exam  Presentation  General Appearance: Appropriate for Environment; Fairly Groomed  Eye Contact:Good  Speech:Clear and Coherent; Normal Rate  Speech Volume:Normal  Handedness:Right   Mood and Affect  Mood:Anxious;  Depressed  Affect:Appropriate; Congruent; Tearful (appropriately tearful at times when discussing her marriage)   Thought Process  Thought Processes:Coherent; Goal Directed; Other (comment)  Descriptions of Associations:Intact  Orientation:Full (Time, Place and Person)  Thought Content:WDL; Logical  Diagnosis of Schizophrenia or Schizoaffective disorder in past: No    Hallucinations:Hallucinations: None  Ideas of Reference:None  Suicidal Thoughts:Suicidal Thoughts: No SI Passive Intent and/or Plan: Without Intent; Without Plan  Homicidal Thoughts:Homicidal Thoughts: No   Sensorium  Memory:Immediate Good; Recent Good; Remote Good  Judgment:Good  Insight:Good   Executive Functions  Concentration:Good  Attention Span:Good  [place] of Knowledge:Good  Language:Good   Psychomotor Activity  Psychomotor Activity:Psychomotor Activity: Normal   Assets  Assets:Communication Skills; Desire for Improvement; Financial Resources/Insurance; Housing; Intimacy; Resilience   Sleep  Sleep:Sleep: Fair   No data recorded  Physical Exam  See SRA for physical exam and ROS  Blood pressure (!) 157/107, pulse 97, temperature 97.6 F (36.4 C), temperature source Oral, resp. rate 16, SpO2 98 %. There is no height or weight on file to calculate BMI.  See SRA for suicide risk assessment  Plan Of Care/Follow-up recommendations:  Activity:  as tolerated Diet:  regular Other:     Patient is instructed prior to discharge to: Take all medications as prescribed by his/her mental healthcare provider. Report any adverse effects and or reactions from the medicines to his/her outpatient provider promptly. Patient has been instructed & cautioned: To not engage in alcohol and or illegal drug use while on prescription medicines. In the event of worsening symptoms, patient is instructed to call the crisis hotline, 911 and or go to  the nearest ED for appropriate evaluation and  treatment of symptoms. To follow-up with his/her primary care provider for your other medical issues, concerns and or health care needs.   30 day prescription for prazosin 1 mg qhs sent to pharmacy of choice. Patient stated that she did not need refills of other medications    [place].   Specialty: [place] Why: MON 10/3 at 3:30 pm- is a virtual appointment and you can expect an email 15 min prior to appt time with the link for session. Please call if questions: [phone]. Contact information: [place] [id number] [place] [phone]            [facility] Follow up.   Why: Please call to schedule an appointment for couples/marriage counseling. Contact information: [address] [place] [address] [phone]      Medication List     STOP taking these medications    buPROPion 150 MG 24 hr tablet Commonly known as: WELLBUTRIN XL       TAKE these medications    acetaminophen 500 MG tablet Commonly known as: TYLENOL Take 2 tablets (1,000 mg total) by mouth every 6 (six) hours as needed.   albuterol 108 (90 Base) MCG/ACT inhaler Commonly known as: VENTOLIN HFA Inhale 2 puffs into the lungs every 6 (six) hours as needed for wheezing or shortness of breath.   amLODipine 10 MG tablet Commonly known as: NORVASC Take 1 tablet (10 mg total) by mouth daily.   cetirizine 10 MG tablet Commonly known as: ZYRTEC Take 10 mg by mouth daily.   etonogestrel-ethinyl estradiol 0.12-0.015 MG/24HR vaginal ring Commonly known as: NUVARING Insert vaginally and leave in place for 3 consecutive weeks, then remove for 1 week.   hydrALAZINE 100 MG tablet Commonly known as: APRESOLINE Take 1 tablet (100 mg total) by mouth 3 (three) times daily.   hydrOXYzine 25 MG tablet Commonly known as: ATARAX/VISTARIL Take 25 mg by mouth 3 (three) times daily.    levothyroxine 112 MCG tablet Commonly known as: SYNTHROID Take 112 mcg by mouth daily before breakfast.   prazosin 1 MG capsule Commonly known as: MINIPRESS Take 1 capsule (1 mg total) by mouth at bedtime.   PRENATAL PO Take 1 tablet by mouth daily.   traMADol 50 MG tablet Commonly known as: ULTRAM Take 50-100 mg by mouth 3 (three) times daily as needed for moderate pain.   venlafaxine XR 150 MG 24 hr capsule Commonly known as: EFFEXOR-XR Take 150 mg by mouth daily.      , Disposition: home with husband  Ival Bible, MD 11/20/2020, 10:58 AM
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
## 2020-11-20 NOTE — ED Notes (Signed)
Patient is in her bed sleeping. No respiratory distress noted. Will continue to monitor for safety.

## 2020-11-20 NOTE — ED Notes (Signed)
Patient denies Yarnell. Morning medication administered without difficulty to patient. Patient is cooperative and interacts well with staff. Patient stated she is doing well and has no concerns at present. Will continue to monitor for safety.

## 2020-11-20 NOTE — ED Notes (Signed)
Patient is participating well in groups. No respiratory distress/ discomfort noted. Will continue to monitor for safety.

## 2020-11-20 NOTE — Progress Notes (Signed)
Vitals rechecked.  MD present when taken.  Patient denies any complaints. Stated she had 2 cups of coffee and her headache is gone.

## 2020-11-20 NOTE — ED Notes (Signed)
Pt ambulated to dining room to get a cup of ice water. After returning back to room, pt stated she feels dizzy, has a headache 7/10, and "my chest is pounding." Vital signs obtained manually. Pt requesting Tylenol. No signs of acute distress noted. Will continue to monitor for safety.

## 2020-11-26 ENCOUNTER — Other Ambulatory Visit: Payer: Self-pay

## 2020-11-26 ENCOUNTER — Other Ambulatory Visit (HOSPITAL_COMMUNITY): Payer: 59 | Attending: Psychiatry

## 2020-11-26 ENCOUNTER — Telehealth (HOSPITAL_COMMUNITY): Payer: Self-pay | Admitting: Licensed Clinical Social Worker

## 2020-11-27 ENCOUNTER — Telehealth (HOSPITAL_COMMUNITY): Payer: Self-pay | Admitting: Professional

## 2020-12-12 ENCOUNTER — Other Ambulatory Visit (HOSPITAL_COMMUNITY): Payer: 59 | Admitting: Professional

## 2020-12-12 ENCOUNTER — Other Ambulatory Visit: Payer: Self-pay

## 2020-12-12 ENCOUNTER — Telehealth (HOSPITAL_COMMUNITY): Payer: Self-pay | Admitting: Professional

## 2020-12-12 DIAGNOSIS — F332 Major depressive disorder, recurrent severe without psychotic features: Secondary | ICD-10-CM

## 2020-12-12 DIAGNOSIS — F4310 Post-traumatic stress disorder, unspecified: Secondary | ICD-10-CM

## 2020-12-17 ENCOUNTER — Other Ambulatory Visit: Payer: Self-pay

## 2020-12-17 ENCOUNTER — Other Ambulatory Visit (HOSPITAL_COMMUNITY): Payer: 59

## 2020-12-17 ENCOUNTER — Telehealth (HOSPITAL_COMMUNITY): Payer: Self-pay | Admitting: Professional

## 2020-12-18 ENCOUNTER — Other Ambulatory Visit (HOSPITAL_COMMUNITY): Payer: 59

## 2020-12-18 ENCOUNTER — Other Ambulatory Visit: Payer: Self-pay

## 2020-12-19 ENCOUNTER — Ambulatory Visit (HOSPITAL_COMMUNITY): Payer: 59

## 2020-12-19 ENCOUNTER — Other Ambulatory Visit (HOSPITAL_COMMUNITY): Payer: 59

## 2020-12-20 ENCOUNTER — Ambulatory Visit (HOSPITAL_COMMUNITY): Payer: 59

## 2020-12-20 ENCOUNTER — Other Ambulatory Visit (HOSPITAL_COMMUNITY): Payer: 59

## 2020-12-21 ENCOUNTER — Ambulatory Visit (HOSPITAL_COMMUNITY): Payer: 59

## 2020-12-21 ENCOUNTER — Other Ambulatory Visit (HOSPITAL_COMMUNITY): Payer: 59

## 2020-12-24 ENCOUNTER — Other Ambulatory Visit (HOSPITAL_COMMUNITY): Payer: 59

## 2020-12-24 ENCOUNTER — Ambulatory Visit (HOSPITAL_COMMUNITY): Payer: 59

## 2020-12-25 ENCOUNTER — Ambulatory Visit (HOSPITAL_COMMUNITY): Payer: 59

## 2020-12-25 ENCOUNTER — Other Ambulatory Visit (HOSPITAL_COMMUNITY): Payer: 59

## 2020-12-26 ENCOUNTER — Ambulatory Visit (HOSPITAL_COMMUNITY): Payer: 59

## 2020-12-26 ENCOUNTER — Other Ambulatory Visit (HOSPITAL_COMMUNITY): Payer: 59
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
## 2020-12-27 ENCOUNTER — Ambulatory Visit (HOSPITAL_COMMUNITY): Payer: 59

## 2020-12-27 ENCOUNTER — Other Ambulatory Visit (HOSPITAL_COMMUNITY): Payer: 59

## 2020-12-28 ENCOUNTER — Ambulatory Visit (HOSPITAL_COMMUNITY): Payer: 59

## 2020-12-28 ENCOUNTER — Other Ambulatory Visit (HOSPITAL_COMMUNITY): Payer: 59

## 2021-01-15 ENCOUNTER — Ambulatory Visit (INDEPENDENT_AMBULATORY_CARE_PROVIDER_SITE_OTHER): Payer: 59 | Admitting: Allergy

## 2021-01-15 ENCOUNTER — Encounter: Payer: Self-pay | Admitting: Allergy

## 2021-01-15 ENCOUNTER — Other Ambulatory Visit: Payer: Self-pay

## 2021-01-15 VITALS — BP 120/70 | HR 79 | Temp 98.3°F | Resp 18 | Ht 72.0 in | Wt 210.0 lb

## 2021-01-15 DIAGNOSIS — L508 Other urticaria: Secondary | ICD-10-CM

## 2021-01-15 DIAGNOSIS — J45909 Unspecified asthma, uncomplicated: Secondary | ICD-10-CM

## 2021-01-15 DIAGNOSIS — T783XXD Angioneurotic edema, subsequent encounter: Secondary | ICD-10-CM

## 2021-01-15 DIAGNOSIS — T783XXA Angioneurotic edema, initial encounter: Secondary | ICD-10-CM

## 2021-01-15 DIAGNOSIS — J3089 Other allergic rhinitis: Secondary | ICD-10-CM | POA: Diagnosis not present

## 2021-01-15 DIAGNOSIS — T63481A Toxic effect of venom of other arthropod, accidental (unintentional), initial encounter: Secondary | ICD-10-CM

## 2021-01-15 DIAGNOSIS — J4540 Moderate persistent asthma, uncomplicated: Secondary | ICD-10-CM | POA: Diagnosis not present

## 2021-01-15 DIAGNOSIS — T63441A Toxic effect of venom of bees, accidental (unintentional), initial encounter: Secondary | ICD-10-CM

## 2021-01-15 HISTORY — DX: Angioneurotic edema, initial encounter: T78.3XXA

## 2021-01-15 MED ORDER — EPINEPHRINE 0.3 MG/0.3ML IJ SOAJ: 0.3000 mg | INTRAMUSCULAR | 2 refills | Status: AC | PRN

## 2021-01-15 MED ORDER — FAMOTIDINE 20 MG PO TABS
20.0000 mg | ORAL_TABLET | Freq: Two times a day (BID) | ORAL | 3 refills | Status: DC
Start: 2021-01-15 — End: 2021-03-26

## 2021-01-15 MED ORDER — CETIRIZINE HCL 10 MG PO TABS: 10.0000 mg | ORAL_TABLET | Freq: Two times a day (BID) | ORAL | 3 refills | Status: DC

## 2021-01-15 NOTE — Patient Instructions (Addendum)
Today's skin testing was positive to cat only. Results given.  Hives/swelling:  I'm not sure if the cat is the cause of your current hives and swelling.   Start zyrtec (cetirizine) 10mg  twice a day. If symptoms are not controlled or causes drowsiness let us know. Start pepcid (famotidine) 20mg  twice a day.  May take benadryl 25mg  to 50mg  every 4-6 hours additionally as needed for flares. Avoid the following potential triggers: alcohol, tight clothing, NSAIDs, hot showers and getting overheated. Read about Xolair injections -  [email or web address] Get bloodwork:  We are ordering labs, so please allow 1-2 weeks for the results to come back. With the newly implemented Cures Act, the labs might be visible to you at the same time that they become visible to me. However, I will not address the results until all of the results are back, so please be patient.    I have prescribed epinephrine injectable device and demonstrated proper use. For mild symptoms you can take over the counter antihistamines such as Benadryl and monitor symptoms closely. If symptoms worsen or if you have severe symptoms including breathing issues, throat closure, significant swelling, whole body hives, severe diarrhea and vomiting, lightheadedness then inject epinephrine and seek immediate medical care afterwards. Emergency action plan given.  Environmental allergies: Start environmental control measures as below. Use over the counter antihistamines such as Zyrtec (cetirizine), Claritin (loratadine), Allegra (fexofenadine), or Xyzal (levocetirizine) daily as needed. May take twice a day during allergy flares. May switch antihistamines every few months.  Asthma: Normal breathing test today. May use albuterol rescue inhaler 2 puffs  every 4 to 6 hours as needed for shortness of breath, chest tightness, coughing, and wheezing. Monitor frequency of use.  If you are using your albuterol more than twice per week let  us know.   Bee stings: Continue to avoid. Get bloodwork.  Follow up in 4 weeks or sooner if needed.    Pet Allergen Avoidance: Contrary to popular opinion, there are no "hypoallergenic" breeds of dogs or cats. That is because people are not allergic to an animal's hair, but to an allergen found in the animal's saliva, dander (dead skin flakes) or urine. Pet allergy symptoms typically occur within minutes. For some people, symptoms can build up and become most severe 8 to 12 hours after contact with the animal. People with severe allergies can experience reactions in public places if dander has been transported on the pet owners' clothing. Keeping an animal outdoors is only a partial solution, since homes with pets in the yard still have higher concentrations of animal allergens. Before getting a pet, ask your allergist to determine if you are allergic to animals. If your pet is already considered part of your family, try to minimize contact and keep the pet out of the bedroom and other rooms where you spend a great deal of time. As with dust mites, vacuum carpets often or replace carpet with a hardwood floor, tile or linoleum. High-efficiency particulate air (HEPA) cleaners can reduce allergen levels over time. While dander and saliva are the source of cat and dog allergens, urine is the source of allergens from rabbits, hamsters, mice and [place] pigs; so ask a non-allergic family member to clean the animal's cage. If you have a pet allergy, talk to your allergist about the potential for allergy immunotherapy (allergy shots). This strategy can often provide long-term relief.

## 2021-01-15 NOTE — Assessment & Plan Note (Addendum)
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 Deviated septum and nasal polypectomy surgery in the past. No prior allergy work up. 1 cat at home.  Today's skin prick testing was only positive to cats.  Will get bloodwork instead of intradermal testing as we are getting bloodwork for the urticaria/angioedema symptoms.   Patient may be interested in allergy immunotherapy in the future.   Start environmental control measures as below.  Use over the counter antihistamines such as Zyrtec (cetirizine), Claritin (loratadine), Allegra (fexofenadine), or Xyzal (levocetirizine) daily as needed. May take twice a day during allergy flares. May switch antihistamines every few months.

## 2021-01-15 NOTE — Progress Notes (Signed)
New Patient Note  RE: Krystal [name] MRN: [id number] DOB: 02-May-1990 Date of Office Visit: 01/15/2021  Consult requested by: Jonathon Jordan, MD Primary care provider: Jonathon Jordan, MD  Chief Complaint: Urticaria  History of Present Illness: I had the pleasure of seeing Krystal [name] for initial evaluation at the [facility] on 01/15/2021. She is a 30 y.o. female, who is self-referred here for the evaluation of allergic reactions  Allergic reactions: She noted some periorbital swelling, lip swelling, and hives which started about 2-3 months ago. The rash can occur anywhere on her body. Describes them as itchy, red, raised. Individual rashes lasts about 1 day after benadryl.  The swelling usually lasts for a few days - currently has a mild left periorbital swelling.   No ecchymosis upon resolution. Associated symptoms include: Had few episodes of difficulty breathing when the facial/lip angioedema gets significant.  Frequency of episodes: hives occur every few days and swelling occurs a few times per week.  Suspected triggers are unknown - there's 1 cat at home but she had the same cat for over 6 yrs but husband is concerned if the cat allergy is causing all these symptoms.  Denies any fevers, chills, changes in medications, foods, personal care products or recent infections. She has tried the following therapies: benadryl, zyrtec with some benefit. Systemic steroids yes. Currently on zyrtec 10mg  daily.  Previous work up includes: none. Previous history of rash/hives/swelling: no. Patient is up to date with the following cancer screening tests: physical exam and pap smears.  Family history of angioedema: no. Ace-inhibitor use: no  Environmental allergies: She reports symptoms of sneezing, itchy eyes. Symptoms have been going on for 12 years. The symptoms are present mainly in the spring and fall. Other triggers include exposure to cats. Anosmia: no.  Headache: yes. She has used antihistamines with fair improvement in symptoms. Previous work up includes: none. Previous ENT evaluation: yes and had deviated septum and polypectomy about 9 years ago History of nasal polyps: yes. Last eye exam: no. History of reflux: yes and takes tums prn.  Reviewed images on the phone - significant lip angioedema noted and urticaria.   11/12/2020 UC visit: "Patient presents with upper lip swelling, itchy throat, shortness of breath for 1 day.  Initially had bilateral periorbital swelling, diffuse flesh tone papular rash over groin and abdomen which has now resolved.  All symptoms began upon awakening.  Denies fever, chills, changes in soaps, lotions, fragrances, detergents, linens, recent travel, changes in diet.  Similar symptoms occurred 2 years ago spontaneously during the same time of year.  Was already using Claritin on a daily for seasonal allergies.  Attempted use of Benadryl with no relief.  History of asthma.  Denies wheezing, coughing, chest tightness or pain with, difficulty swallowing, sore throat, difficulty breathing."  Past medical history - necrotizing fasciitis requiring left arm amputation.   Assessment and Plan: Takyla is a 30 y.o. female with: Chronic urticaria Breaking out in whole body hives a few times per week with associated periorbital/lip swelling for the past 2-3 months - sometimes has trouble breathing. No triggers noted. Spouse concerned about cat allergies - 1 cat at home x 6+ years. Tried antihistamines with some benefit. 1 course of prednisone.  Today's skin testing was positive to cat only. Discussed with patient that I don't think the cat is the sole cause of her current symptoms.  Start zyrtec (cetirizine) 10mg  twice a day. If symptoms are not controlled or  causes drowsiness let us know. Start Pepcid (famotidine) 20mg  twice a day.  May take benadryl 25mg  to 50mg  every 4-6 hours additionally as needed for flares. Avoid the  following potential triggers: alcohol, tight clothing, NSAIDs, hot showers and getting overheated. Read about Xolair injections.  Get bloodwork to rule out other etiologies.  I have prescribed epinephrine injectable device and demonstrated proper use. For mild symptoms you can take over the counter antihistamines such as Benadryl and monitor symptoms closely. If symptoms worsen or if you have severe symptoms including breathing issues, throat closure, significant swelling, whole body hives, severe diarrhea and vomiting, lightheadedness then inject epinephrine and seek immediate medical care afterwards. Emergency action plan given.  Angio-edema Not on ace inhibitor. No family history of angioedema. No recent bloodwork. See assessment and plan as above.  Other allergic rhinitis Rhino conjunctivitis symptoms mainly in the spring and fall and around close cat contact. Deviated septum and nasal polypectomy surgery in the past. No prior allergy work up. 1 cat at home. Today's skin prick testing was only positive to cats. Will get bloodwork instead of intradermal testing as we are getting bloodwork for the urticaria/angioedema symptoms.  Patient may be interested in allergy immunotherapy in the future.  Start environmental control measures as below. Use over the counter antihistamines such as Zyrtec (cetirizine), Claritin (loratadine), Allegra (fexofenadine), or Xyzal (levocetirizine) daily as needed. May take twice a day during allergy flares. May switch antihistamines every few months.  Local reaction to hymenoptera sting Localized reaction to bee stings with possible shortness of breath. No prior work up. Continue to avoid. Get bloodwork.  Asthma Usually flares in the spring and uses albuterol daily then. No prior maintenance inhalers. Today' spirometry was normal.  May use albuterol rescue inhaler 2 puffs  every 4 to 6 hours as needed for shortness of breath, chest tightness, coughing, and  wheezing. Monitor frequency of use.  If you are using your albuterol more than twice per week let us know.   Return in about 4 weeks (around 02/12/2021).  Meds ordered this encounter  Medications   cetirizine (ZYRTEC ALLERGY) 10 MG tablet    Sig: Take 1 tablet (10 mg total) by mouth 2 (two) times daily.    Dispense:  60 tablet    Refill:  3   famotidine (PEPCID) 20 MG tablet    Sig: Take 1 tablet (20 mg total) by mouth 2 (two) times daily.    Dispense:  60 tablet    Refill:  3   EPINEPHrine 0.3 mg/0.3 mL IJ SOAJ injection    Sig: Inject 0.3 mg into the muscle as needed for anaphylaxis.    Dispense:  1 each    Refill:  2    May dispense generic/Mylan/Teva brand.    Lab Orders         Alpha-Gal Panel         ANA w/Reflex         C1 Esterase Inhibitor         C1 esterase inhibitor, functional         C3 and C4         CBC with Differential/Platelet         Chronic Urticaria         Complement component c1q         Comprehensive metabolic panel         C-reactive protein         Sedimentation rate  Thyroid Cascade Profile         Tryptase         Allergen Hymenoptera Panel         Allergens w/Total IgE Area 2      Other allergy screening: Asthma: yes Using albuterol 1-2 times per week and daily during the spring months.  Food allergy: no Red dye - triggers migraines. No recent tick bites.  Medication allergy: yes Hymenoptera allergy: no Localized reactions History of recurrent infections suggestive of immunodeficency: no  Diagnostics: Spirometry:  Tracings reviewed. Her effort: Good reproducible efforts. FVC: 4.01L FEV1: 2.95L, 77% predicted FEV1/FVC ratio: 74% Interpretation: Spirometry consistent with normal pattern.  Please see scanned spirometry results for details.  Skin Testing: Environmental allergy panel and select foods. Positive to cat. Negative to common foods.  Results discussed with patient/family.  Airborne Adult Perc - 01/15/21 1004      Time Antigen Placed 1004    Allergen Manufacturer Lavella Hammock    Location Back    Number of Test 59    1. Control-Buffer 50% Glycerol Negative    2. Control-Histamine 1 mg/ml 2+    3. Albumin saline Negative    4. [place] Negative    5. [place] Negative    6. [place] Negative    7. [place] Blue Negative    8. Meadow Fescue Negative    9. Perennial Rye Negative    10. Sweet Vernal Negative    11. Timothy Negative    12. Cocklebur Negative    13. Burweed Marshelder Negative    14. Ragweed, short Negative    15. Ragweed, Giant Negative    16. Plantain,  English Negative    17. Lamb's Quarters Negative    18. Sheep Sorrell Negative    19. Rough Pigweed Negative    20. Marsh Elder, Rough Negative    21. Mugwort, Common Negative    22. Ash mix Negative    23. Birch mix Negative    24. Beech American Negative    25. Box, Elder Negative    26. Cedar, red Negative    27. Cottonwood, [place] Negative    28. Elm mix Negative    29. Hickory Negative    30. Maple mix Negative    31. Oak, [place] mix Negative    32. Pecan Pollen Negative    33. Pine mix Negative    34. Sycamore Eastern Negative    35. Castor, Black Pollen Negative    36. Alternaria alternata Negative    37. Cladosporium Herbarum Negative    38. Aspergillus mix Negative    39. Penicillium mix Negative    40. Bipolaris sorokiniana (Helminthosporium) Negative    41. Drechslera spicifera (Curvularia) Negative    42. Mucor plumbeus Negative    43. Fusarium moniliforme Negative    44. Aureobasidium pullulans (pullulara) Negative    45. Rhizopus oryzae Negative    46. Botrytis cinera Negative    47. Epicoccum nigrum Negative    48. Phoma betae Negative    49. Candida Albicans Negative    50. Trichophyton mentagrophytes Negative    51. Mite, D Farinae  5,000 AU/ml Negative    52. Mite, D Pteronyssinus  5,000 AU/ml 2+    53. Cat Hair 10,000 BAU/ml Negative    54.  Dog Epithelia Negative    55. Mixed Feathers Negative     56. Horse Epithelia Negative    57. Cockroach, German Negative    58. Mouse Negative  59. Tobacco Leaf Negative             Food Perc - 01/15/21 1005       Test Information   Time Antigen Placed 1005    Allergen Manufacturer Lavella Hammock    Location Back    Number of allergen test 10      Food   1. Peanut Negative    2. Soybean food Negative    3. Wheat, whole Negative    4. Sesame Negative    5. Milk, cow Negative    6. Egg White, chicken Negative    7. Casein Negative    8. Shellfish mix Negative    9. Fish mix Negative    10. Cashew Negative             Past Medical History: Patient Active Problem List   Diagnosis Date Noted   Chronic urticaria 01/15/2021   Angio-edema 01/15/2021   Local reaction to hymenoptera sting 01/15/2021   Other allergic rhinitis 01/15/2021   MDD (major depressive disorder), recurrent episode, severe ([place]) 11/19/2020   Amenorrhea 05/11/2020   Fibromyalgia 05/11/2020   Gastroesophageal reflux disease 05/11/2020   Interstitial cystitis 05/11/2020   Mild recurrent major depression ([place]) 05/11/2020   Posttraumatic stress disorder 05/11/2020   Primary insomnia 05/11/2020   Essential hypertension 05/11/2020   Acute acalculous cholecystitis s/p lap cholecystectomy 05/11/2020 05/11/2020   Intractable right upper quadrant abdominal pain 05/08/2020   Hypokalemia 05/08/2020   Hypertensive urgency 05/08/2020   Postpartum depression 11/30/2019   Hypothyroidism due to Hashimoto's thyroiditis 10/03/2019   Anxiety 06/16/2019   Asthma 06/07/2019   History of uterine scar from previous surgery 06/07/2019   Obesity 06/07/2019   History of HELLP syndrome, currently pregnant 04/21/2019   Depression during pregnancy 07/20/2018   Lumbar spondylosis 06/23/2018   Sacroiliac inflammation ([place]) 06/23/2018   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 Diagnosed at age 70 years Blood pressure control this pregnancy has been challenging with continued severe range blood pressures at home and on prenatal visits  Current regimen: hydralazine 75 mg TID, amlodipine 10mg  daily, coreg 25mg  BID Previous medications including: lisinopril, HCTZ, amlodipine, labetalol  Intolerance to Procardia a   UTI (urinary tract infection)    Past Surgical History: Past Surgical History:  Procedure Laterality Date   APPENDECTOMY     BIOPSY  05/10/2020   Procedure: BIOPSY;  Surgeon: Arta Silence, MD;  Location: [facility];  Service: Endoscopy;;   CESAREAN SECTION N/A 12/06/2017   Procedure: CESAREAN SECTION;  Surgeon: Sloan Leiter, MD;  Location: [facility];  Service: Obstetrics;  Laterality: N/A;   CHOLECYSTECTOMY N/A 05/11/2020   Procedure: SINGLE SITE LAPAROSCOPIC CHOLECYSTECTOMY AND LIVER BIOSY;  Surgeon: Michael Boston, MD;  Location: [facility];  Service: General;  Laterality: N/A;   ESOPHAGOGASTRODUODENOSCOPY (EGD) WITH PROPOFOL N/A 05/10/2020   Procedure: ESOPHAGOGASTRODUODENOSCOPY (EGD) WITH PROPOFOL;  Surgeon: Arta Silence, MD;  Location: [facility];  Service: Endoscopy;  Laterality: N/A;   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 30 tablet 0   albuterol (PROVENTIL HFA;VENTOLIN HFA) 108 (90 Base) MCG/ACT inhaler Inhale 2 puffs into the lungs  every 6 (six) hours as needed for wheezing or shortness of breath.     amLODipine (NORVASC) 10 MG tablet Take 1 tablet (10 mg total) by mouth daily. 30 tablet 2   cetirizine (ZYRTEC ALLERGY) 10 MG tablet Take 1 tablet (10 mg total) by mouth 2 (two) times daily. 60 tablet 3   EPINEPHrine 0.3 mg/0.3 mL IJ SOAJ injection Inject 0.3 mg into the muscle as needed for anaphylaxis. 1 each 2   famotidine (PEPCID) 20 MG tablet Take 1 tablet (20 mg total) by mouth 2 (two) times daily. 60 tablet 3   hydrALAZINE (APRESOLINE) 100 MG tablet Take 1 tablet (100 mg total) by mouth 3 (three) times daily. 90 tablet 1   venlafaxine XR (EFFEXOR-XR) 150 MG 24 hr capsule Take 150 mg by mouth daily.     etonogestrel-ethinyl estradiol (NUVARING) 0.12-0.015 MG/24HR vaginal ring Insert vaginally and leave in place for 3 consecutive weeks, then remove for 1 week. (Patient not taking: Reported on 01/15/2021) 1 each 12   prazosin (MINIPRESS) 1 MG capsule Take 1 capsule (1 mg total) by mouth at bedtime. 30 capsule 0   No current facility-administered medications for this visit.   Allergies: Allergies  Allergen Reactions   Red Dye Other (See Comments)    ALL CAUSE PROBLEMS #40 IS WORST-MIGRAINES    Compazine [Prochlorperazine Maleate] Other (See Comments)    TWITCHING, CANT STAY STILL. Pt states she can tolerate promethazine   Other Other (See Comments)    FANSIDAR FOR MALARIA-ASTHMA   Vicodin [Hydrocodone-Acetaminophen] Itching, Nausea And Vomiting and Other (See Comments)    Pt states she can tolerate acetaminophen   Amitriptyline Hcl Other (See Comments)    Angry moods   Duloxetine Hcl Other (See Comments)    Passing out, trimble, irritable   Reglan [Metoclopramide] Other (See Comments)    restless   Nifedipine Nausea And Vomiting and Other (See Comments)    Discussed with patient and maternal fetal medicine, patient has intolerance but not frank allergy.   Social History: Social History   Socioeconomic  History   Marital status: Married    Spouse name: Not on file   Number of children: Not on file   Years of education: Not on file   Highest education level: Not on file  Occupational History   Not on file  Tobacco Use   Smoking status: Never   Smokeless tobacco: Never   Tobacco comments:    She tried a cigarette and never smoked again  Vaping Use   Vaping Use: Never used  Substance and Sexual Activity   Alcohol use: Yes    Comment: bottle of wine a week-occ   Drug use: No   Sexual activity: Not on file  Other Topics Concern   Not on file  Social History Narrative   Not on file   Social Determinants of Health   Financial Resource Strain: Not on file  Food Insecurity: Food Insecurity Present   Worried About [place] in the Last Year: Sometimes true   [place] in  the Last Year: Never true  Transportation Needs: Unmet Transportation Needs   Lack of Transportation (Medical): Yes   Lack of Transportation (Non-Medical): Yes  Physical Activity: Not on file  Stress: Not on file  Social Connections: Not on file   Lives in a house over 16+ years old. Smoking: denies Occupation: stays at home  Environmental History: Water Damage/mildew in the house: no Carpet in the family room: no Carpet in the bedroom: no Heating: gas Cooling: central Pet: yes 1 cat  x 6.5 years  Family History: Family History  Problem Relation Age of Onset   Hypertension Mother    Hypertension Father    Problem                               Relation Asthma                                   Sister, brothers Eczema                                no Food allergy                          no Allergic rhino conjunctivitis     sister  Review of Systems  Constitutional:  Negative for appetite change, chills, fever and unexpected weight change.  HENT:  Negative for congestion and rhinorrhea.        Facial swelling  Eyes:  Negative for itching.  Respiratory:  Negative for cough, chest  tightness, shortness of breath and wheezing.   Cardiovascular:  Negative for chest pain.  Gastrointestinal:  Negative for abdominal pain.  Genitourinary:  Negative for difficulty urinating.  Skin:  Positive for rash.  Allergic/Immunologic: Positive for environmental allergies. Negative for food allergies.  Neurological:  Negative for headaches.   Objective: BP 120/70   Pulse 79   Temp 98.3 F (36.8 C) (Temporal)   Resp 18   Ht 6' (1.829 m)   Wt 210 lb (95.3 kg)   SpO2 99%   BMI 28.48 kg/m  Body mass index is 28.48 kg/m. Physical Exam Vitals and nursing note reviewed.  Constitutional:      Appearance: Normal appearance. She is well-developed.  HENT:     Head: Normocephalic and atraumatic.     Right Ear: Tympanic membrane and external ear normal.     Left Ear: Tympanic membrane and external ear normal.     Nose: Nose normal.     Mouth/Throat:     Mouth: Mucous membranes are moist.     Pharynx: Oropharynx is clear.  Eyes:     Conjunctiva/sclera: Conjunctivae normal.  Cardiovascular:     Rate and Rhythm: Normal rate and regular rhythm.     Heart sounds: Normal heart sounds. No murmur heard.   No friction rub. No gallop.  Pulmonary:     Effort: Pulmonary effort is normal.     Breath sounds: Normal breath sounds. No wheezing, rhonchi or rales.  Musculoskeletal:     Cervical back: Neck supple.     Comments: Left hand amputated. Right hand - scarring noted.  Skin:    General: Skin is warm.     Findings: No rash.  Neurological:     Mental Status: She is alert and  oriented to person, place, and time.  Psychiatric:        Behavior: Behavior normal.  The plan was reviewed with the patient/family, and all questions/concerned were addressed.  It was my pleasure to see [place] today and participate in her care. Please feel free to contact me with any questions or concerns.  Sincerely,  Rexene Alberts, DO Allergy & Immunology  [facility] office: [place] office: [phone]

## 2021-01-15 NOTE — Assessment & Plan Note (Signed)
Breaking out in whole body hives a few times per week with associated periorbital/lip swelling for the past 2-3 months - sometimes has trouble breathing. No triggers noted. Spouse concerned about cat allergies - 1 cat at home x 6+ years. Tried antihistamines with some benefit. 1 course of prednisone.   Today's skin testing was positive to cat only.  Discussed with patient that I don't think the cat is the sole cause of her current symptoms.   Start zyrtec (cetirizine) 10mg  twice a day.  If symptoms are not controlled or causes drowsiness let us know.  Start Pepcid (famotidine) 20mg  twice a day.   May take benadryl 25mg  to 50mg  every 4-6 hours additionally as needed for flares. . Avoid the following potential triggers: alcohol, tight clothing, NSAIDs, hot showers and getting overheated. . Read about Xolair injections.  . Get bloodwork to rule out other etiologies.  . I have prescribed epinephrine injectable device and demonstrated proper use. For mild symptoms you can take over the counter antihistamines such as Benadryl and monitor symptoms closely. If symptoms worsen or if you have severe symptoms including breathing issues, throat closure, significant swelling, whole body hives, severe diarrhea and vomiting, lightheadedness then inject epinephrine and seek immediate medical care afterwards. . Emergency action plan given.

## 2021-01-15 NOTE — Assessment & Plan Note (Signed)
Localized reaction to bee stings with possible shortness of breath. No prior work up. . Continue to avoid. . Get bloodwork.

## 2021-01-15 NOTE — Assessment & Plan Note (Signed)
Usually flares in the spring and uses albuterol daily then. No prior maintenance inhalers. . Today' spirometry was normal.  . May use albuterol rescue inhaler 2 puffs  every 4 to 6 hours as needed for shortness of breath, chest tightness, coughing, and wheezing. Monitor frequency of use.  o If you are using your albuterol more than twice per week let us know.

## 2021-01-15 NOTE — Assessment & Plan Note (Signed)
Not on ace inhibitor. No family history of angioedema. No recent bloodwork. See assessment and plan as above.

## 2021-02-02 LAB — ENA+DNA/DS+SJORGEN'S
ENA RNP Ab: 2.2 AI — ABNORMAL HIGH (ref 0.0–0.9)
ENA SM Ab Ser-aCnc: <0.2 AI (ref 0.0–0.9)
ENA SSA (RO) Ab: <0.2 AI (ref 0.0–0.9)
ENA SSB (LA) Ab: <0.2 AI (ref 0.0–0.9)
dsDNA Ab: <1 IU/mL (ref 0–9)

## 2021-02-02 LAB — CBC WITH DIFFERENTIAL/PLATELET
Basophils Absolute: 0 10*3/uL (ref 0.0–0.2)
Basos: 1 %
EOS (ABSOLUTE): 0.1 10*3/uL (ref 0.0–0.4)
Eos: 1 %
Hematocrit: 46.2 % (ref 34.0–46.6)
Hemoglobin: 15.4 g/dL (ref 11.1–15.9)
Immature Grans (Abs): 0 10*3/uL (ref 0.0–0.1)
Immature Granulocytes: 0 %
Lymphocytes Absolute: 1.7 10*3/uL (ref 0.7–3.1)
Lymphs: 37 %
MCH: 32.1 pg (ref 26.6–33.0)
MCHC: 33.3 g/dL (ref 31.5–35.7)
MCV: 96 fL (ref 79–97)
Monocytes Absolute: 0.5 10*3/uL (ref 0.1–0.9)
Monocytes: 10 %
Neutrophils Absolute: 2.4 10*3/uL (ref 1.4–7.0)
Neutrophils: 51 %
Platelets: 246 10*3/uL (ref 150–450)
RBC: 4.8 x10E6/uL (ref 3.77–5.28)
RDW: 12.2 % (ref 11.7–15.4)
WBC: 4.8 10*3/uL (ref 3.4–10.8)

## 2021-02-02 LAB — ANA W/REFLEX: Anti Nuclear Antibody (ANA): POSITIVE — AB

## 2021-02-02 LAB — ALLERGENS W/TOTAL IGE AREA 2
Alternaria Alternata IgE: <0.1 kU/L
Aspergillus Fumigatus IgE: <0.1 kU/L
Bermuda Grass IgE: <0.1 kU/L
Cat Dander IgE: 47.5 kU/L — AB
Cedar, Mountain IgE: <0.1 kU/L
Cladosporium Herbarum IgE: <0.1 kU/L
Cockroach, German IgE: <0.1 kU/L
Common Silver Birch IgE: <0.1 kU/L
Cottonwood IgE: <0.1 kU/L
D Farinae IgE: <0.1 kU/L
D Pteronyssinus IgE: <0.1 kU/L
Dog Dander IgE: 1.74 kU/L — AB
Elm, American IgE: <0.1 kU/L
Johnson Grass IgE: <0.1 kU/L
Maple/Box Elder IgE: <0.1 kU/L
Mouse Urine IgE: <0.1 kU/L
Oak, White IgE: <0.1 kU/L
Pecan, Hickory IgE: <0.1 kU/L
Penicillium Chrysogen IgE: <0.1 kU/L
Pigweed, Rough IgE: <0.1 kU/L
Ragweed, Short IgE: <0.1 kU/L
Sheep Sorrel IgE Qn: <0.1 kU/L
Timothy Grass IgE: 0.15 kU/L — AB
White Mulberry IgE: <0.1 kU/L

## 2021-02-02 LAB — CHRONIC URTICARIA: cu index: 30.1 — ABNORMAL HIGH (ref <10)

## 2021-02-02 LAB — COMPREHENSIVE METABOLIC PANEL
ALT: 24 IU/L (ref 0–32)
AST: 22 IU/L (ref 0–40)
Albumin/Globulin Ratio: 2 (ref 1.2–2.2)
Albumin: 4.9 g/dL — ABNORMAL HIGH (ref 3.8–4.8)
Alkaline Phosphatase: 71 IU/L (ref 44–121)
BUN/Creatinine Ratio: 17 (ref 9–23)
BUN: 13 mg/dL (ref 6–20)
Bilirubin Total: 0.5 mg/dL (ref 0.0–1.2)
CO2: 24 mmol/L (ref 20–29)
Calcium: 9.5 mg/dL (ref 8.7–10.2)
Chloride: 101 mmol/L (ref 96–106)
Creatinine, Ser: 0.77 mg/dL (ref 0.57–1.00)
Globulin, Total: 2.5 g/dL (ref 1.5–4.5)
Glucose: 73 mg/dL (ref 70–99)
Potassium: 4 mmol/L (ref 3.5–5.2)
Sodium: 140 mmol/L (ref 134–144)
Total Protein: 7.4 g/dL (ref 6.0–8.5)
eGFR: 105 mL/min/{1.73_m2} (ref >59)

## 2021-02-02 LAB — ALPHA-GAL PANEL
Allergen Lamb IgE: <0.1 kU/L
Beef IgE: <0.1 kU/L
IgE (Immunoglobulin E), Serum: 103 IU/mL (ref 6–495)
O215-IgE Alpha-Gal: <0.1 kU/L
Pork IgE: <0.1 kU/L

## 2021-02-02 LAB — ALLERGEN HYMENOPTERA PANEL
Bumblebee: <0.1 kU/L
Honeybee IgE: 0.11 kU/L — AB
Hornet, White Face, IgE: <0.1 kU/L
Hornet, Yellow, IgE: <0.1 kU/L
Paper Wasp IgE: <0.1 kU/L
Yellow Jacket, IgE: <0.1 kU/L

## 2021-02-02 LAB — C1 ESTERASE INHIBITOR, FUNCTIONAL: C1INH Functional/C1INH Total MFr SerPl: 80 %mean normal

## 2021-02-02 LAB — TRYPTASE: Tryptase: 5.8 ug/L (ref 2.2–13.2)

## 2021-02-02 LAB — SEDIMENTATION RATE: Sed Rate: 5 mm/hr (ref 0–32)

## 2021-02-02 LAB — COMPLEMENT COMPONENT C1Q: Complement C1Q: 16.6 mg/dL (ref 10.3–20.5)

## 2021-02-02 LAB — C-REACTIVE PROTEIN: CRP: 2 mg/L (ref 0–10)

## 2021-02-02 LAB — C1 ESTERASE INHIBITOR: C1INH SerPl-mCnc: 29 mg/dL (ref 21–39)

## 2021-02-02 LAB — C3 AND C4
Complement C3, Serum: 118 mg/dL (ref 82–167)
Complement C4, Serum: 20 mg/dL (ref 12–38)

## 2021-02-02 LAB — THYROID CASCADE PROFILE: TSH: 3.04 u[IU]/mL (ref 0.450–4.500)

## 2021-02-05 ENCOUNTER — Telehealth: Payer: Self-pay

## 2021-02-05 NOTE — Telephone Encounter (Signed)
Please place rheumatology referral for positive ANA with elevated RNP antibody.per dr Maudie Mercury

## 2021-02-20 ENCOUNTER — Other Ambulatory Visit: Payer: Self-pay

## 2021-02-20 ENCOUNTER — Ambulatory Visit (INDEPENDENT_AMBULATORY_CARE_PROVIDER_SITE_OTHER): Payer: 59

## 2021-02-20 DIAGNOSIS — O219 Vomiting of pregnancy, unspecified: Secondary | ICD-10-CM | POA: Diagnosis not present

## 2021-02-20 DIAGNOSIS — Z3200 Encounter for pregnancy test, result unknown: Secondary | ICD-10-CM

## 2021-02-20 DIAGNOSIS — Z3201 Encounter for pregnancy test, result positive: Secondary | ICD-10-CM

## 2021-02-20 LAB — POCT PREGNANCY, URINE: Preg Test, Ur: POSITIVE — AB

## 2021-02-20 MED ORDER — ONDANSETRON 4 MG PO TBDP: 4.0000 mg | ORAL_TABLET | Freq: Three times a day (TID) | ORAL | 0 refills | Status: DC | PRN

## 2021-02-20 MED ORDER — SCOPOLAMINE 1 MG/3DAYS TD PT72: 1.0000 | MEDICATED_PATCH | TRANSDERMAL | 0 refills | Status: DC

## 2021-02-20 NOTE — Progress Notes (Signed)
Possible Pregnancy  Here today for pregnancy confirmation. UPT in office today is positive. Pt reports positive UPT at PCP yesterday. Reviewed dating with patient:   LMP: 01/19/21 EDD: 10/26/21 4w 4d today  OB history reviewed; c-section x 2 and SAB x 2. History of HELLP and pre-eclampsia. Reviewed medications and allergies with patient; list of medications safe to take during pregnancy given.  Recommended pt begin prenatal vitamin and schedule prenatal care. Pt desires to be seen in our office; front office notified to schedule.  Pt reports nausea and vomiting. States she has taken Zofran that she had at home which does improve nausea. States Phenergan causes her to be too drowsy to complete ADLs. States Zofran and Scopalamine patch has helped in the past. Verbal order from [place], MD for Zofran ODT 4 mg and Scopalamine patch. Reviewed risk associated with Zofran use in first trimester.  Krystal Howells, RN 02/20/2021  1:32 PM

## 2021-02-21 NOTE — Progress Notes (Signed)
Chart reviewed for nurse visit. Agree with plan of care.   Clarnce Flock, MD 02/21/21 8:14 AM

## 2021-02-28 ENCOUNTER — Ambulatory Visit: Payer: 59 | Admitting: Allergy

## 2021-03-08 ENCOUNTER — Encounter: Payer: Self-pay | Admitting: Student

## 2021-03-08 ENCOUNTER — Inpatient Hospital Stay (HOSPITAL_COMMUNITY): Payer: Commercial Managed Care - HMO

## 2021-03-08 ENCOUNTER — Inpatient Hospital Stay (HOSPITAL_COMMUNITY)
Admission: AD | Admit: 2021-03-08 | Discharge: 2021-03-08 | Disposition: A | Discharge status: Home / Self Care | Payer: Commercial Managed Care - HMO | Attending: Obstetrics & Gynecology | Admitting: Obstetrics & Gynecology

## 2021-03-08 ENCOUNTER — Other Ambulatory Visit: Payer: Self-pay

## 2021-03-08 DIAGNOSIS — Z3A01 Less than 8 weeks gestation of pregnancy: Secondary | ICD-10-CM | POA: Diagnosis not present

## 2021-03-08 DIAGNOSIS — O10911 Unspecified pre-existing hypertension complicating pregnancy, first trimester: Secondary | ICD-10-CM | POA: Insufficient documentation

## 2021-03-08 DIAGNOSIS — O26851 Spotting complicating pregnancy, first trimester: Secondary | ICD-10-CM | POA: Insufficient documentation

## 2021-03-08 DIAGNOSIS — I10 Essential (primary) hypertension: Secondary | ICD-10-CM

## 2021-03-08 DIAGNOSIS — Z6790 Unspecified blood type, Rh positive: Secondary | ICD-10-CM | POA: Insufficient documentation

## 2021-03-08 DIAGNOSIS — O26891 Other specified pregnancy related conditions, first trimester: Secondary | ICD-10-CM | POA: Diagnosis not present

## 2021-03-08 DIAGNOSIS — R1030 Lower abdominal pain, unspecified: Secondary | ICD-10-CM | POA: Insufficient documentation

## 2021-03-08 DIAGNOSIS — Z79899 Other long term (current) drug therapy: Secondary | ICD-10-CM | POA: Diagnosis not present

## 2021-03-08 DIAGNOSIS — Z7901 Long term (current) use of anticoagulants: Secondary | ICD-10-CM | POA: Diagnosis not present

## 2021-03-08 DIAGNOSIS — R109 Unspecified abdominal pain: Secondary | ICD-10-CM

## 2021-03-08 DIAGNOSIS — O209 Hemorrhage in early pregnancy, unspecified: Secondary | ICD-10-CM

## 2021-03-08 DIAGNOSIS — Z3491 Encounter for supervision of normal pregnancy, unspecified, first trimester: Secondary | ICD-10-CM

## 2021-03-08 LAB — CBC
HCT: 38.7 % (ref 36.0–46.0)
Hemoglobin: 13.4 g/dL (ref 12.0–15.0)
MCH: 32.3 pg (ref 26.0–34.0)
MCHC: 34.6 g/dL (ref 30.0–36.0)
MCV: 93.3 fL (ref 80.0–100.0)
Platelets: 272 10*3/uL (ref 150–400)
RBC: 4.15 MIL/uL (ref 3.87–5.11)
RDW: 12.2 % (ref 11.5–15.5)
WBC: 5.4 10*3/uL (ref 4.0–10.5)
nRBC: 0 % (ref 0.0–0.2)

## 2021-03-08 LAB — URINALYSIS, ROUTINE W REFLEX MICROSCOPIC
Bilirubin Urine: NEGATIVE
Glucose, UA: NEGATIVE mg/dL
Hgb urine dipstick: NEGATIVE
Ketones, ur: NEGATIVE mg/dL
Nitrite: NEGATIVE
Protein, ur: NEGATIVE mg/dL
Specific Gravity, Urine: 1.017 (ref 1.005–1.030)
pH: 6 (ref 5.0–8.0)

## 2021-03-08 LAB — HCG, QUANTITATIVE, PREGNANCY: hCG, Beta Chain, Quant, S: 11129 m[IU]/mL — ABNORMAL HIGH (ref <5)

## 2021-03-08 MED ORDER — LABETALOL HCL 100 MG PO TABS: 100.0000 mg | ORAL_TABLET | Freq: Two times a day (BID) | ORAL | 0 refills | Status: DC

## 2021-03-08 NOTE — MAU Note (Signed)
Presents with c/o spotting and cramping.  Reports spotting began 4 days ago and cramping yesterday.  LMP 01/20/2021.

## 2021-03-08 NOTE — Discharge Instructions (Signed)
Return to care  If you have heavier bleeding that soaks through more than 2 pads per hour for an hour or more If you bleed so much that you feel like you might pass out or you do pass out If you have significant abdominal pain that is not improved with Tylenol   

## 2021-03-08 NOTE — MAU Provider Note (Addendum)
History     CSN: [id number]  Arrival date and time: 03/08/21 0932   Event Date/Time   First Provider Initiated Contact with Patient 03/08/21 1048      Chief Complaint  Patient presents with   Vaginal Bleeding   Abdominal Pain   HPI  Krystal [name] is a 31 year old female [id number] with a history of chronic hypertension, preeclampsia, and HELLP, who is currently [redacted]w[redacted]d pregnant and presents with spotting and cramping.   The spotting began 4 days ago. Patient noticed dark, brown blood stains in her underwear. She said the amount of spotting each day has been consistent, does not increase with activity, and is not more frequent during a certain time of day. She has had bright red blood in her underwear when she had miscarriages previously but described this spotting as different. She typically has heavy periods, so this bleeding has been minimal in comparison.  She said she began noticing blood clots yesterday when she saw them in the toilet. She said she passed 2 quarter-sized clots and 2 dime-sized clots during the day, and she noticed another dime-sized clot this morning around 0530.   Patient last had intercourse 3 weeks ago. She recently returned from a trip to [place] by train, but no other activity out of the ordinary. She denies headaches and chest pain but reports she has been more fatigued than usual. She said she has had cramping like a mild menstrual cycle over the past 4 days, and she has noticed cramping has caused a sensation of increased pressure within the lower back.She denies a history of bleeding disorders but mentioned significant bleeding following a C-section.   She has had chronic hypertension since age 73. Her first baby was born at [redacted] weeks gestation due to preeclampsia and HELLP. She was diagnosed with preeclamspia early in the second pregnancy, and began taking hydralazine 100mg  TID after an appointment with a cardiologist. She delivered the baby at 52 weeks and has  been taking hydralazine for the past 2 years, but reports poor blood pressure control. Both babies are healthy and are now 3-years-old and 67.14-years-old.  OB History     Gravida  5   Para  2   Term  0   Preterm  2   AB  2   Living  2      SAB  2   IAB  0   Ectopic  0   Multiple  0   Live Births  2           Past Medical History:  Diagnosis Date   Anxiety    Asthma    HELLP (hemolytic anemia/elev liver enzymes/low platelets in pregnancy) 12/08/2017   IC (interstitial cystitis)    Kidney stones    Migraines    Severe uncontrolled hypertension 10/08/2017   UTI (urinary tract infection)     Past Surgical History:  Procedure Laterality Date   APPENDECTOMY     BIOPSY  05/10/2020   Procedure: BIOPSY;  Surgeon: Arta Silence, MD;  Location: [facility];  Service: Endoscopy;;   CESAREAN SECTION N/A 12/06/2017   Procedure: CESAREAN SECTION;  Surgeon: Sloan Leiter, MD;  Location: [facility];  Service: Obstetrics;  Laterality: N/A;   CHOLECYSTECTOMY N/A 05/11/2020   Procedure: SINGLE SITE LAPAROSCOPIC CHOLECYSTECTOMY AND LIVER BIOSY;  Surgeon: Michael Boston, MD;  Location: [facility];  Service: General;  Laterality: N/A;   ESOPHAGOGASTRODUODENOSCOPY (EGD) WITH PROPOFOL N/A 05/10/2020   Procedure: ESOPHAGOGASTRODUODENOSCOPY (EGD) WITH PROPOFOL;  Surgeon:  Arta Silence, MD;  Location: [facility];  Service: Endoscopy;  Laterality: N/A;   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   HAND RECONSTRUCTION Right    INCISION AND DRAINAGE     NASAL SEPTUM SURGERY      Family History  Problem Relation Age of Onset   Hypertension Mother    Hypertension Father     Social History   Tobacco Use   Smoking status: Never   Smokeless tobacco: Never   Tobacco comments:    She tried a cigarette and never smoked again  Vaping Use   Vaping Use: Never used  Substance Use Topics   Alcohol use: Not Currently    Comment: bottle of wine a week-occ   Drug  use: No    Allergies:  Allergies  Allergen Reactions   Red Dye Other (See Comments)    ALL CAUSE PROBLEMS #40 IS WORST-MIGRAINES    Compazine [Prochlorperazine Maleate] Other (See Comments)    TWITCHING, CANT STAY STILL. Pt states she can tolerate promethazine   Other Other (See Comments)    FANSIDAR FOR MALARIA-ASTHMA   Vicodin [Hydrocodone-Acetaminophen] Itching, Nausea And Vomiting and Other (See Comments)    Pt states she can tolerate acetaminophen   Amitriptyline Hcl Other (See Comments)    Angry moods   Duloxetine Hcl Other (See Comments)    Passing out, trimble, irritable   Reglan [Metoclopramide] Other (See Comments)    restless   Nifedipine Nausea And Vomiting and Other (See Comments)    Discussed with patient and maternal fetal medicine, patient has intolerance but not frank allergy.    Medications Prior to Admission  Medication Sig Dispense Refill Last Dose   acetaminophen (TYLENOL) 500 MG tablet Take 2 tablets (1,000 mg total) by mouth every 6 (six) hours as needed. 30 tablet 0 Past Week   albuterol (PROVENTIL HFA;VENTOLIN HFA) 108 (90 Base) MCG/ACT inhaler Inhale 2 puffs into the lungs every 6 (six) hours as needed for wheezing or shortness of breath.   03/07/2021   cetirizine (ZYRTEC ALLERGY) 10 MG tablet Take 1 tablet (10 mg total) by mouth 2 (two) times daily. 60 tablet 3 03/08/2021   famotidine (PEPCID) 20 MG tablet Take 1 tablet (20 mg total) by mouth 2 (two) times daily. 60 tablet 3 03/08/2021   hydrALAZINE (APRESOLINE) 100 MG tablet Take 1 tablet (100 mg total) by mouth 3 (three) times daily. 90 tablet 1 03/08/2021   ondansetron (ZOFRAN-ODT) 4 MG disintegrating tablet Take 1 tablet (4 mg total) by mouth every 8 (eight) hours as needed for nausea or vomiting. 20 tablet 0 03/08/2021   venlafaxine XR (EFFEXOR-XR) 150 MG 24 hr capsule Take 150 mg by mouth daily.   03/07/2021   amLODipine (NORVASC) 10 MG tablet Take 1 tablet (10 mg total) by mouth daily. (Patient not  taking: Reported on 02/20/2021) 30 tablet 2    EPINEPHrine 0.3 mg/0.3 mL IJ SOAJ injection Inject 0.3 mg into the muscle as needed for anaphylaxis. 1 each 2    scopolamine (TRANSDERM-SCOP) 1 MG/3DAYS Place 1 patch (1.5 mg total) onto the skin every 3 (three) days. 10 patch 0     Review of Systems  Constitutional: Negative.   Gastrointestinal:  Positive for abdominal pain.  Genitourinary:  Positive for vaginal bleeding.  Physical Exam   Blood pressure (!) 167/109, pulse 72, temperature (!) 97.5 F (36.4 C), temperature source Oral, resp. rate 20, height 5\' 11"  (1.803 m), weight 96.6 kg, last menstrual  period 01/19/2021, SpO2 99 %, not currently breastfeeding.  Physical Exam Vitals and nursing note reviewed. Exam conducted with a chaperone present.  Constitutional:      General: She is not in acute distress.    Appearance: She is well-developed.  HENT:     Head: Normocephalic and atraumatic.  Eyes:     General: No scleral icterus.    Pupils: Pupils are equal, round, and reactive to light.  Pulmonary:     Effort: Pulmonary effort is normal. No respiratory distress.  Neurological:     General: No focal deficit present.     Mental Status: She is alert.  Psychiatric:        Mood and Affect: Mood normal.        Behavior: Behavior normal.    MAU Course  Labs  CBC wnl UA normal Beta hCG elevated (11,129)  Procedures  Transvaginal and transabdominal ultrasound IMPRESSION: 1. Single live intrauterine pregnancy with estimated gestational age of [redacted] weeks, 4 days. 2. Trace fluid in the cervix, nonspecific.   Assessment and Plan  Krystal [name] is a 31 year old female [phone] with a history of chronic hypertension, preeclampsia, and HELLP, who is currently [redacted]w[redacted]d pregnant and presents with spotting and cramping. Given the reassuring findings of normal CBC and Beta hCG, speculum exam with no evidence of debris or active bleeding, and ultrasound, ectopic pregnancy ruled out, and  patient has viable intrauterine pregnancy. Patient will be discharged today. - Return precautions given - Add labetalol 100mg  BID to current regimen of hydralazine 100mg  TID - Follow-up appointment scheduled 04/01/21 with Dr. Milus Glazier 03/08/2021, 12:55 PM    Attestation of Supervision of Student:  I confirm that I have verified the information documented in the medical students note and that I have also personally performed the history, physical exam and all medical decision making activities.  I have verified that all services and findings are accurately documented in this student's note; and I agree with management and plan as outlined in the documentation. I have also made any necessary editorial changes.  History Krystal [name] is a 31 y.o. [id number] at [redacted]w[redacted]d who presents with vaginal bleeding & abdominal cramping. Symptoms started last week. Reports pink/brown spotting daily. Not saturating pads. Passed a few small blood clots yesterday. Also reports intermittent lower abdominal cramping & back pressure. Rates pain 6/10. History of chronic hypertension. Currently takes hydralazine 100 mg TID & hasn't missed any doses. Denies headache, chest pain, or SOB.   Physical exam BP (!) 164/105 (BP Location: Right Arm)    Pulse 71    Temp 97.7 F (36.5 C) (Oral)    Resp 19    Ht 5\' 11"  (1.803 m)    Wt 96.6 kg    LMP 01/19/2021    SpO2 99%    BMI 29.69 kg/m    Physical Examination: General appearance - alert, well appearing, and in no distress Mental status - normal mood, behavior, speech, dress, motor activity, and thought processes Eyes - sclera anicteric Chest - normal respiratory effort Abdomen - soft, nontender, nondistended, no masses or organomegaly Pelvic - NEFG, scant tan discharge. No bleeding. Cervix pink smooth. No CMT. Cervix closed. Chaperone present for exam.  Skin - warm & dry   Assessment/Plan 1. Normal IUP (intrauterine pregnancy) on prenatal ultrasound, first  trimester  -ultrasound shows live IUP, size c/w LMP dating  2. Vaginal bleeding in pregnancy, first trimester  -RH positive -no blood on exam -reviewed bleeding precautions &  reasons to return to MAU  3. Abdominal pain during pregnancy in first trimester   4. Essential hypertension  -added labetalol 100 mg BID to hydralazine regimen per consult with Dr. Roselie Awkward  5. [redacted] weeks gestation of pregnancy     Jorje Guild, [place] for [employer], [place] [facility] 03/08/2021 2:10 PM

## 2021-03-26 ENCOUNTER — Other Ambulatory Visit: Payer: Self-pay

## 2021-03-26 ENCOUNTER — Encounter: Payer: Self-pay | Admitting: Allergy

## 2021-03-26 ENCOUNTER — Ambulatory Visit (INDEPENDENT_AMBULATORY_CARE_PROVIDER_SITE_OTHER): Payer: Managed Care, Other (non HMO) | Admitting: Allergy

## 2021-03-26 DIAGNOSIS — J3089 Other allergic rhinitis: Secondary | ICD-10-CM | POA: Diagnosis not present

## 2021-03-26 DIAGNOSIS — L508 Other urticaria: Secondary | ICD-10-CM | POA: Diagnosis not present

## 2021-03-26 DIAGNOSIS — T63481A Toxic effect of venom of other arthropod, accidental (unintentional), initial encounter: Secondary | ICD-10-CM

## 2021-03-26 DIAGNOSIS — J4530 Mild persistent asthma, uncomplicated: Secondary | ICD-10-CM | POA: Diagnosis not present

## 2021-03-26 DIAGNOSIS — T783XXD Angioneurotic edema, subsequent encounter: Secondary | ICD-10-CM

## 2021-03-26 DIAGNOSIS — Z3A09 9 weeks gestation of pregnancy: Secondary | ICD-10-CM

## 2021-03-26 MED ORDER — CETIRIZINE HCL 10 MG PO TABS
10.0000 mg | ORAL_TABLET | Freq: Two times a day (BID) | ORAL | 5 refills | Status: DC
Start: 2021-03-26 — End: 2022-05-05

## 2021-03-26 MED ORDER — BUDESONIDE-FORMOTEROL FUMARATE 80-4.5 MCG/ACT IN AERO: 2.0000 | INHALATION_SPRAY | Freq: Two times a day (BID) | RESPIRATORY_TRACT | 3 refills | Status: DC

## 2021-03-26 MED ORDER — FAMOTIDINE 20 MG PO TABS
20.0000 mg | ORAL_TABLET | Freq: Two times a day (BID) | ORAL | 5 refills | Status: DC
Start: 2021-03-26 — End: 2021-07-24

## 2021-03-26 NOTE — Assessment & Plan Note (Signed)
Past history - Localized reaction to bee stings with possible shortness of breath.  Interim history - Negative hymenoptera panel.   Continue to avoid.

## 2021-03-26 NOTE — Assessment & Plan Note (Signed)
Past history - Rhino conjunctivitis symptoms mainly in the spring and fall and around close cat contact. Deviated septum and nasal polypectomy surgery in the past.1 cat at home. 2022 skin prick testing was only positive to cats. 2022 bloodwork positive to cat and dog.  Interim history - congestion if misses antihistamines.  Continue environmental control measures.  Use over the counter antihistamines such as Zyrtec (cetirizine), Claritin (loratadine), Allegra (fexofenadine), or Xyzal (levocetirizine) daily as needed. May take twice a day during allergy flares. May switch antihistamines every few months.

## 2021-03-26 NOTE — Progress Notes (Signed)
RE: DAISSY YERIAN MRN: [id number] DOB: 1990-11-23 Date of Telemedicine Visit: 03/26/2021  Referring provider: Jonathon Jordan, MD Primary care provider: Audley Hose, MD  Chief Complaint: Urticaria (A lot of hives has subsided, she has had break outs but they go away and don't spread. She thinks they may come from stress. No eye swelling or lip swelling. )   Telemedicine Follow Up Visit via Telephone: I connected with Tametria Weigelt for a follow up on 03/26/21 by telephone and verified that I am speaking with the correct person using two identifiers.   I discussed the limitations, risks, security and privacy concerns of performing an evaluation and management service by telephone and the availability of in person appointments. I also discussed with the patient that there may be a patient responsible charge related to this service. The patient expressed understanding and agreed to proceed.  Patient is at home/work. Provider is at the office.  Visit start time: 3:47PM Visit end time: 4:07PM Insurance consent/check in by: front desk Medical consent and medical assistant/nurse: Lachelle S.  History of Present Illness: She is a 31 y.o. female, who is being followed for urticaria with angioedema, allergic rhinitis, local reaction to hymenoptera sting and asthma. Her previous allergy office visit was on 01/15/2021 with Dr. Maudie Mercury. Today is a regular follow up visit.  Chronic urticaria Patient is 9.[redacted] weeks pregnant. No more swelling but still has some hives on the torso about a few times per month. Usually resolves within 30 minutes.  Stress causes hive outbreak.   Currently taking zyrtec 9m BID and famotidine 216mBID.  Sometimes forgets to take it and no increase in hive symptoms. She does get some nasal symptoms.  Allergic rhinitis 1 cat at home - husband changing litter now that she is pregnant.    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     Asthma Doing well and using albuterol about twice per week.  Vacuuming and changing the cat litter triggers her asthma.  She had a burst of prednisone before christmas due to excess cat and dust mite exposure.  Assessment and Plan: JuHisayos a [age].o. female with: Chronic urticaria Past history - Breaking out in whole body hives a few times per week with associated periorbital/lip swelling for the past 2-3 months - sometimes has trouble breathing. No triggers noted. Spouse concerned about cat allergies - 1 cat at home x 6+ years. Tried antihistamines with some benefit. 1 course of prednisone.  Interim history - Bloodwork (CBC diff, CMP, TSH, ESR, CRP, tryptase, Alpha gal) all normal. CU elevated, positive ANA with elevated RNP antibody - referral for rheumatology placed in December 2022. Hives much better with no angioedema episodes.  Continue zyrtec (cetirizine) 1029mwice a day. If symptoms are not controlled or causes drowsiness let us [place]. Continue Pepcid (famotidine) 97m49mice a day.  May take benadryl 25mg81m50mg 85my 4-6 hours additionally as needed for flares. Avoid the following potential triggers: alcohol, tight clothing, NSAIDs, hot showers and getting overheated.  Angio-edema Past history - Not on ace inhibitor. No family history of angioedema. Interim history - angioedema panel negative. No more episodes.  Monitor symptoms.   Perennial allergic rhinitis Past history - Rhino conjunctivitis symptoms mainly in the spring and fall and around close cat contact. Deviated septum and nasal polypectomy surgery in the past.1 cat at home. 2022 skin prick testing was only positive to cats. 2022 bloodwork positive to cat and dog.  Interim history - congestion if misses antihistamines. Continue environmental control measures. Use over the counter antihistamines such as Zyrtec (cetirizine), Claritin (loratadine), Allegra (fexofenadine), or Xyzal (levocetirizine) daily as needed. May  take twice a day during allergy flares. May switch antihistamines every few months.  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  Interim history - Negative hymenoptera panel.  Continue to avoid.  Mild persistent asthma without complication Past history - Usually flares in the spring and uses albuterol daily then. No prior maintenance inhalers. 2022 spirometry was normal.  Interim history - 1 course of prednisone due to flare from cat and dust exposure.  Daily controller medication(s): none During upper respiratory infections/asthma flares:  Start Symbicort 29mg 2 puffs twice a day and rinse mouth afterwards for 1-2 weeks until your breathing symptoms return to baseline.  May use albuterol rescue inhaler 2 puffs every 4 to 6 hours as needed for shortness of breath, chest tightness, coughing, and wheezing. May use albuterol rescue inhaler 2 puffs 5 to 15 minutes prior to strenuous physical activities. Monitor frequency of use.   Return in about 3 months (around 06/23/2021).  Meds ordered this encounter  Medications   budesonide-formoterol (SYMBICORT) 80-4.5 MCG/ACT inhaler    Sig: Inhale 2 puffs into the lungs in the morning and at bedtime. Take for 1-2 weeks at a time during asthma flare. Rinse mouth after each use.    Dispense:  1 each    Refill:  3   cetirizine (ZYRTEC ALLERGY) 10 MG tablet    Sig: Take 1 tablet (10 mg total) by mouth 2 (two) times daily.    Dispense:  60 tablet    Refill:  5   famotidine (PEPCID) 20 MG tablet    Sig: Take 1 tablet (20 mg total) by mouth 2 (two) times daily.    Dispense:  60 tablet    Refill:  5   Lab Orders  No laboratory test(s) ordered today    Diagnostics: None.  Medication List:  Current Outpatient Medications  Medication Sig Dispense Refill   acetaminophen (TYLENOL) 500 MG tablet Take 2 tablets (1,000 mg total) by mouth every 6 (six) hours as needed. 30 tablet  0   albuterol (PROVENTIL HFA;VENTOLIN HFA) 108 (90 Base) MCG/ACT inhaler Inhale 2 puffs into the lungs every 6 (six) hours as needed for wheezing or shortness of breath.     budesonide-formoterol (SYMBICORT) 80-4.5 MCG/ACT inhaler Inhale 2 puffs into the lungs in the morning and at bedtime. Take for 1-2 weeks at a time during asthma flare. Rinse mouth after each use. 1 each 3   cetirizine (ZYRTEC ALLERGY) 10 MG tablet Take 1 tablet (10 mg total) by mouth 2 (two) times daily. 60 tablet 5   cholecalciferol (VITAMIN D3) 25 MCG (1000 UNIT) tablet Take 1,000 Units by mouth daily.     EPINEPHrine 0.3 mg/0.3 mL IJ SOAJ injection Inject 0.3 mg into the muscle as needed for anaphylaxis. 1 each 2   famotidine (PEPCID) 20 MG tablet Take 1 tablet (20 mg total) by mouth 2 (two) times daily. 60 tablet 5   hydrALAZINE (APRESOLINE) 100 MG tablet Take 1 tablet (100 mg total) by mouth 3 (three) times daily. 90 tablet 1   labetalol (NORMODYNE) 100 MG tablet Take 1 tablet (100 mg total) by mouth 2 (two) times daily. (Patient taking differently: Take 100 mg by mouth 3 (three) times daily.) 60 tablet 0   ondansetron (ZOFRAN-ODT)  4 MG disintegrating tablet Take 1 tablet (4 mg total) by mouth every 8 (eight) hours as needed for nausea or vomiting. 20 tablet 0   Prenatal Multivit-Min-Fe-FA (PRE-NATAL PO) Take by mouth.     venlafaxine XR (EFFEXOR-XR) 150 MG 24 hr capsule Take 150 mg by mouth daily.     No current facility-administered medications for this visit.   Allergies: Allergies  Allergen Reactions   Red Dye Other (See Comments)    ALL CAUSE PROBLEMS #40 IS WORST-MIGRAINES    Compazine [Prochlorperazine Maleate] Other (See Comments)    TWITCHING, CANT STAY STILL. Pt states she can tolerate promethazine   Other Other (See Comments)    FANSIDAR FOR MALARIA-ASTHMA   Vicodin [Hydrocodone-Acetaminophen] Itching, Nausea And Vomiting and Other (See Comments)    Pt states she can tolerate acetaminophen    Amitriptyline Hcl Other (See Comments)    Angry moods   Duloxetine Hcl Other (See Comments)    Passing out, trimble, irritable   Reglan [Metoclopramide] Other (See Comments)    restless   Nifedipine Nausea And Vomiting and Other (See Comments)    Discussed with patient and maternal fetal medicine, patient has intolerance but not frank allergy.   I reviewed her past medical history, social history, family history, and environmental history and no significant changes have been reported from her previous visit.  Review of Systems  Constitutional:  Negative for appetite change, chills, fever and unexpected weight change.  HENT:  Negative for congestion and rhinorrhea.   Eyes:  Negative for itching.  Respiratory:  Negative for cough, chest tightness, shortness of breath and wheezing.   Cardiovascular:  Negative for chest pain.  Gastrointestinal:  Negative for abdominal pain.  Genitourinary:  Negative for difficulty urinating.  Skin:  Positive for rash.  Allergic/Immunologic: Positive for environmental allergies. Negative for food allergies.  Neurological:  Negative for headaches.   Objective: Physical Exam Not obtained as encounter was done via telephone.   Previous notes and tests were reviewed.  I discussed the assessment and treatment plan with the patient. The patient was provided an opportunity to ask questions and all were answered. The patient agreed with the plan and demonstrated an understanding of the instructions. After visit summary/patient instructions available via mychart.   The patient was advised to call back or seek an in-person evaluation if the symptoms worsen or if the condition fails to improve as anticipated.  I provided 20 minutes of non-face-to-face time during this encounter.  It was my pleasure to participate in [place] care today. Please feel free to contact me with any questions or concerns.   Sincerely,  Rexene Alberts, DO Allergy &  Immunology  [facility] office: [place] office: [phone]

## 2021-03-26 NOTE — Patient Instructions (Addendum)
Hives/swelling:  Continue zyrtec (cetirizine) 10mg  twice a day. If symptoms are not controlled or causes drowsiness let us know. Continue Pepcid (famotidine) 20mg  twice a day.  May take benadryl 25mg  to 50mg  every 4-6 hours additionally as needed for flares. Avoid the following potential triggers: alcohol, tight clothing, NSAIDs, hot showers and getting overheated.  Environmental allergies: 2022 skin testing was positive to cat.  Continue environmental control measures. Use over the counter antihistamines such as Zyrtec (cetirizine), Claritin (loratadine), Allegra (fexofenadine), or Xyzal (levocetirizine) daily as needed. May take twice a day during allergy flares. May switch antihistamines every few months.  Asthma: Daily controller medication(s): none During upper respiratory infections/asthma flares:  Start Symbicort 4mcg 2 puffs twice a day and rinse mouth afterwards for 1-2 weeks until your breathing symptoms return to baseline.  May use albuterol rescue inhaler 2 puffs every 4 to 6 hours as needed for shortness of breath, chest tightness, coughing, and wheezing. May use albuterol rescue inhaler 2 puffs 5 to 15 minutes prior to strenuous physical activities. Monitor frequency of use.  Asthma control goals:  Full participation in all desired activities (may need albuterol before activity) Albuterol use two times or less a week on average (not counting use with activity) Cough interfering with sleep two times or less a month Oral steroids no more than once a year No hospitalizations  Bee stings: Continue to avoid.  Follow up in 3 months or sooner if needed.

## 2021-03-26 NOTE — Assessment & Plan Note (Signed)
Past history - Breaking out in whole body hives a few times per week with associated periorbital/lip swelling for the past 2-3 months - sometimes has trouble breathing. No triggers noted. Spouse concerned about cat allergies - 1 cat at home x 6+ years. Tried antihistamines with some benefit. 1 course of prednisone.  Interim history - Bloodwork (CBC diff, CMP, TSH, ESR, CRP, tryptase, Alpha gal) all normal. CU elevated, positive ANA with elevated RNP antibody - referral for rheumatology placed in December 2022. Hives much better with no angioedema episodes.   Continue zyrtec (cetirizine) 61m twice a day.  If symptoms are not controlled or causes drowsiness let [place].  Continue Pepcid (famotidine) 258mtwice a day.   May take benadryl 2565mo 42m67mery 4-6 hours additionally as needed for flares.  Avoid the following potential triggers: alcohol, tight clothing, NSAIDs, hot showers and getting overheated.

## 2021-03-26 NOTE — Assessment & Plan Note (Signed)
Past history - Not on ace inhibitor. No family history of angioedema. Interim history - angioedema panel negative. No more episodes.  Monitor symptoms.

## 2021-03-26 NOTE — Assessment & Plan Note (Signed)
Past history - Usually flares in the spring and uses albuterol daily then. No prior maintenance inhalers. 2022 spirometry was normal.  Interim history - 1 course of prednisone due to flare from cat and dust exposure.   Daily controller medication(s): none  During upper respiratory infections/asthma flares:  o Start Symbicort 75mcg 2 puffs twice a day and rinse mouth afterwards for 1-2 weeks until your breathing symptoms return to baseline.   May use albuterol rescue inhaler 2 puffs every 4 to 6 hours as needed for shortness of breath, chest tightness, coughing, and wheezing. May use albuterol rescue inhaler 2 puffs 5 to 15 minutes prior to strenuous physical activities. Monitor frequency of use.

## 2021-03-31 ENCOUNTER — Encounter (HOSPITAL_COMMUNITY): Payer: Self-pay | Admitting: Obstetrics and Gynecology

## 2021-03-31 ENCOUNTER — Other Ambulatory Visit: Payer: Self-pay

## 2021-03-31 ENCOUNTER — Inpatient Hospital Stay (HOSPITAL_COMMUNITY)
Admission: AD | Admit: 2021-03-31 | Discharge: 2021-03-31 | Disposition: A | Discharge status: Home / Self Care | Payer: Commercial Managed Care - HMO | Attending: Obstetrics and Gynecology | Admitting: Obstetrics and Gynecology

## 2021-03-31 ENCOUNTER — Inpatient Hospital Stay (HOSPITAL_COMMUNITY): Payer: Commercial Managed Care - HMO

## 2021-03-31 DIAGNOSIS — O10911 Unspecified pre-existing hypertension complicating pregnancy, first trimester: Secondary | ICD-10-CM | POA: Insufficient documentation

## 2021-03-31 DIAGNOSIS — O10919 Unspecified pre-existing hypertension complicating pregnancy, unspecified trimester: Secondary | ICD-10-CM | POA: Diagnosis not present

## 2021-03-31 DIAGNOSIS — E063 Autoimmune thyroiditis: Secondary | ICD-10-CM | POA: Insufficient documentation

## 2021-03-31 DIAGNOSIS — E038 Other specified hypothyroidism: Secondary | ICD-10-CM

## 2021-03-31 DIAGNOSIS — O219 Vomiting of pregnancy, unspecified: Secondary | ICD-10-CM

## 2021-03-31 DIAGNOSIS — Z3A10 10 weeks gestation of pregnancy: Secondary | ICD-10-CM | POA: Diagnosis not present

## 2021-03-31 DIAGNOSIS — O99281 Endocrine, nutritional and metabolic diseases complicating pregnancy, first trimester: Secondary | ICD-10-CM | POA: Insufficient documentation

## 2021-03-31 DIAGNOSIS — Z79899 Other long term (current) drug therapy: Secondary | ICD-10-CM | POA: Diagnosis not present

## 2021-03-31 LAB — URINALYSIS, ROUTINE W REFLEX MICROSCOPIC
Glucose, UA: NEGATIVE mg/dL
Hgb urine dipstick: NEGATIVE
Ketones, ur: NEGATIVE mg/dL
Leukocytes,Ua: NEGATIVE
Nitrite: NEGATIVE
Protein, ur: 100 mg/dL — AB
Specific Gravity, Urine: >1.03 — ABNORMAL HIGH (ref 1.005–1.030)
pH: 6 (ref 5.0–8.0)

## 2021-03-31 LAB — COMPREHENSIVE METABOLIC PANEL
ALT: 18 U/L (ref 0–44)
AST: 17 U/L (ref 15–41)
Albumin: 4 g/dL (ref 3.5–5.0)
Alkaline Phosphatase: 43 U/L (ref 38–126)
Anion gap: 8 (ref 5–15)
BUN: 10 mg/dL (ref 6–20)
CO2: 25 mmol/L (ref 22–32)
Calcium: 9.1 mg/dL (ref 8.9–10.3)
Chloride: 103 mmol/L (ref 98–111)
Creatinine, Ser: 0.68 mg/dL (ref 0.44–1.00)
GFR, Estimated: >60 mL/min (ref >60)
Glucose, Bld: 88 mg/dL (ref 70–99)
Potassium: 3.2 mmol/L — ABNORMAL LOW (ref 3.5–5.1)
Sodium: 136 mmol/L (ref 135–145)
Total Bilirubin: 0.8 mg/dL (ref 0.3–1.2)
Total Protein: 6.4 g/dL — ABNORMAL LOW (ref 6.5–8.1)

## 2021-03-31 LAB — CBC
HCT: 38.8 % (ref 36.0–46.0)
Hemoglobin: 13.3 g/dL (ref 12.0–15.0)
MCH: 31.8 pg (ref 26.0–34.0)
MCHC: 34.3 g/dL (ref 30.0–36.0)
MCV: 92.8 fL (ref 80.0–100.0)
Platelets: 304 10*3/uL (ref 150–400)
RBC: 4.18 MIL/uL (ref 3.87–5.11)
RDW: 12.4 % (ref 11.5–15.5)
WBC: 8.8 10*3/uL (ref 4.0–10.5)
nRBC: 0 % (ref 0.0–0.2)

## 2021-03-31 LAB — HCG, QUANTITATIVE, PREGNANCY: hCG, Beta Chain, Quant, S: 59156 m[IU]/mL — ABNORMAL HIGH (ref <5)

## 2021-03-31 LAB — URINALYSIS, MICROSCOPIC (REFLEX)

## 2021-03-31 LAB — TSH: TSH: 11.272 u[IU]/mL — ABNORMAL HIGH (ref 0.350–4.500)

## 2021-03-31 LAB — T4, FREE: Free T4: 0.84 ng/dL (ref 0.61–1.12)

## 2021-03-31 MED ORDER — FAMOTIDINE IN NACL 20-0.9 MG/50ML-% IV SOLN
20.0000 mg | Freq: Once | INTRAVENOUS | Status: AC
  Administered 2021-03-31: 20 mg via INTRAVENOUS
  Filled 2021-03-31: qty 50

## 2021-03-31 MED ORDER — HYDRALAZINE HCL 50 MG PO TABS
100.0000 mg | ORAL_TABLET | Freq: Once | ORAL | Status: AC
Start: 2021-03-31 — End: 2021-03-31
  Administered 2021-03-31: 100 mg via ORAL
  Filled 2021-03-31: qty 2

## 2021-03-31 MED ORDER — ACETAMINOPHEN 500 MG PO TABS
1000.0000 mg | ORAL_TABLET | Freq: Once | ORAL | Status: AC
Start: 2021-03-31 — End: 2021-03-31
  Administered 2021-03-31: 1000 mg via ORAL
  Filled 2021-03-31: qty 2

## 2021-03-31 MED ORDER — PROMETHAZINE HCL 25 MG PO TABS: 25.0000 mg | ORAL_TABLET | Freq: Four times a day (QID) | ORAL | 2 refills | Status: DC | PRN

## 2021-03-31 MED ORDER — LEVOTHYROXINE SODIUM 112 MCG PO TABS: 112.0000 ug | ORAL_TABLET | Freq: Every day | ORAL | 5 refills | Status: DC

## 2021-03-31 MED ORDER — NIFEDIPINE 10 MG PO CAPS
10.0000 mg | ORAL_CAPSULE | Freq: Once | ORAL | Status: AC
  Administered 2021-03-31: 10 mg via ORAL
  Filled 2021-03-31: qty 1

## 2021-03-31 MED ORDER — LACTATED RINGERS IV BOLUS
1000.0000 mL | Freq: Once | INTRAVENOUS | Status: AC
  Administered 2021-03-31: 1000 mL via INTRAVENOUS

## 2021-03-31 MED ORDER — ONDANSETRON HCL 4 MG/2ML IJ SOLN: 4.0000 mg | Freq: Once | INTRAMUSCULAR | Status: DC

## 2021-03-31 MED ORDER — ONDANSETRON 4 MG PO TBDP
8.0000 mg | ORAL_TABLET | Freq: Once | ORAL | Status: AC
  Administered 2021-03-31: 8 mg via ORAL
  Filled 2021-03-31: qty 2

## 2021-03-31 MED ORDER — SCOPOLAMINE 1 MG/3DAYS TD PT72
1.0000 | MEDICATED_PATCH | TRANSDERMAL | Status: DC
  Administered 2021-03-31: 1.5 mg via TRANSDERMAL
  Filled 2021-03-31: qty 1

## 2021-03-31 MED ORDER — NIFEDIPINE 10 MG PO CAPS: 10.0000 mg | ORAL_CAPSULE | Freq: Three times a day (TID) | ORAL | 3 refills | Status: DC

## 2021-03-31 MED ORDER — SCOPOLAMINE 1 MG/3DAYS TD PT72: 1.0000 | MEDICATED_PATCH | TRANSDERMAL | 12 refills | Status: DC

## 2021-03-31 MED ORDER — ONDANSETRON 4 MG PO TBDP: 4.0000 mg | ORAL_TABLET | Freq: Three times a day (TID) | ORAL | 2 refills | Status: DC | PRN

## 2021-03-31 NOTE — MAU Note (Signed)
Krystal [name] is a 31 y.o. at [redacted]w[redacted]d here in MAU reporting: nausea and vomiting since Friday. >10 episodes of emesis. Having trouble keeping down her antihypertensives. Did have a headache but none now.  Onset of complaint: ongoing  Pain score: 0/10  Vitals:   03/31/21 1341  BP: (!) 176/108  Pulse: 65  Resp: 16  Temp: 98.3 F (36.8 C)  SpO2: 97%     CAR:[id number]  Lab orders placed from triage: UA

## 2021-03-31 NOTE — Discharge Instructions (Signed)

## 2021-03-31 NOTE — MAU Provider Note (Signed)
History     CSN: [id number]  Arrival date and time: 03/31/21 1308   Event Date/Time   First Provider Initiated Contact with Patient 03/31/21 1344      Chief Complaint  Patient presents with   Nausea   HPI Krystal [name] is a 31 y.o. [id number] at [redacted]w[redacted]d who presents with nausea and vomiting. She reports she has vomited 10+ times a day since Friday. She believes this is related to her labetalol. She states it was increased to 100mg  TID this week and since then, she vomits profusely after taking her doses. She reports she attempts to premedicate with zofran with no relief. She has been unable to eat or drink because of the vomiting. She is unable to take her medications for [facility]. She denies any HA, chest pain or shortness of breath. She denies any abdominal pain, vaginal bleeding or discharge.   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 Both deliveries were by c/s. She reports a diagnosis of CHTN at age 71 which has been difficult to manage.   OB History     Gravida  5   Para  2   Term  0   Preterm  2   AB  2   Living  2      SAB  2   IAB  0   Ectopic  0   Multiple  0   Live Births  2           Past Medical History:  Diagnosis Date   Anxiety    Asthma    HELLP (hemolytic anemia/elev liver enzymes/low platelets in pregnancy) 12/08/2017   IC (interstitial cystitis)    Kidney stones    Migraines    Severe uncontrolled hypertension 10/08/2017   UTI (urinary tract infection)     Past Surgical History:  Procedure Laterality Date   APPENDECTOMY     BIOPSY  05/10/2020   Procedure: BIOPSY;  Surgeon: Arta Silence, MD;  Location: [facility];  Service: Endoscopy;;   CESAREAN SECTION N/A 12/06/2017   Procedure: CESAREAN SECTION;  Surgeon: Sloan Leiter, MD;  Location: [facility];  Service: Obstetrics;  Laterality: N/A;   CHOLECYSTECTOMY N/A 05/11/2020   Procedure: SINGLE SITE LAPAROSCOPIC  CHOLECYSTECTOMY AND LIVER BIOSY;  Surgeon: Michael Boston, MD;  Location: [facility];  Service: General;  Laterality: N/A;   ESOPHAGOGASTRODUODENOSCOPY (EGD) WITH PROPOFOL N/A 05/10/2020   Procedure: ESOPHAGOGASTRODUODENOSCOPY (EGD) WITH PROPOFOL;  Surgeon: Arta Silence, MD;  Location: [facility];  Service: Endoscopy;  Laterality: N/A;   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   HAND RECONSTRUCTION Right    INCISION AND DRAINAGE     NASAL SEPTUM SURGERY      Family History  Problem Relation Age of Onset   Hypertension Mother    Hypertension Father     Social History   Tobacco Use   Smoking status: Never   Smokeless tobacco: Never   Tobacco comments:    She tried a cigarette and never smoked again  Vaping Use   Vaping Use: Never used  Substance Use Topics   Alcohol use: Not Currently    Comment: bottle of wine a week-occ   Drug use: No    Allergies:  Allergies  Allergen Reactions   Red Dye Other (See Comments)    ALL CAUSE PROBLEMS #40 IS WORST-MIGRAINES    Compazine [Prochlorperazine Maleate] Other (See Comments)  TWITCHING, CANT STAY STILL. Pt states she can tolerate promethazine   Other Other (See Comments)    FANSIDAR FOR MALARIA-ASTHMA   Vicodin [Hydrocodone-Acetaminophen] Itching, Nausea And Vomiting and Other (See Comments)    Pt states she can tolerate acetaminophen   Amitriptyline Hcl Other (See Comments)    Angry moods   Duloxetine Hcl Other (See Comments)    Passing out, trimble, irritable   Reglan [Metoclopramide] Other (See Comments)    restless   Nifedipine Nausea And Vomiting and Other (See Comments)    Discussed with patient and maternal fetal medicine, patient has intolerance but not frank allergy.    Medications Prior to Admission  Medication Sig Dispense Refill Last Dose   famotidine (PEPCID) 20 MG tablet Take 1 tablet (20 mg total) by mouth 2 (two) times daily. 60 tablet 5 03/30/2021   hydrALAZINE (APRESOLINE) 100 MG tablet Take 1  tablet (100 mg total) by mouth 3 (three) times daily. 90 tablet 1 03/31/2021 at 11   labetalol (NORMODYNE) 100 MG tablet Take 1 tablet (100 mg total) by mouth 2 (two) times daily. (Patient taking differently: Take 100 mg by mouth 3 (three) times daily.) 60 tablet 0 03/31/2021 at 9   ondansetron (ZOFRAN-ODT) 4 MG disintegrating tablet Take 1 tablet (4 mg total) by mouth every 8 (eight) hours as needed for nausea or vomiting. 20 tablet 0 03/31/2021 at 830   acetaminophen (TYLENOL) 500 MG tablet Take 2 tablets (1,000 mg total) by mouth every 6 (six) hours as needed. 30 tablet 0    albuterol (PROVENTIL HFA;VENTOLIN HFA) 108 (90 Base) MCG/ACT inhaler Inhale 2 puffs into the lungs every 6 (six) hours as needed for wheezing or shortness of breath.      budesonide-formoterol (SYMBICORT) 80-4.5 MCG/ACT inhaler Inhale 2 puffs into the lungs in the morning and at bedtime. Take for 1-2 weeks at a time during asthma flare. Rinse mouth after each use. 1 each 3    cetirizine (ZYRTEC ALLERGY) 10 MG tablet Take 1 tablet (10 mg total) by mouth 2 (two) times daily. 60 tablet 5    cholecalciferol (VITAMIN D3) 25 MCG (1000 UNIT) tablet Take 1,000 Units by mouth daily.      EPINEPHrine 0.3 mg/0.3 mL IJ SOAJ injection Inject 0.3 mg into the muscle as needed for anaphylaxis. 1 each 2    Prenatal Multivit-Min-Fe-FA (PRE-NATAL PO) Take by mouth.      venlafaxine XR (EFFEXOR-XR) 150 MG 24 hr capsule Take 150 mg by mouth daily.       Review of Systems  Constitutional: Negative.  Negative for fatigue and fever.  HENT: Negative.    Respiratory: Negative.  Negative for shortness of breath.   Cardiovascular: Negative.  Negative for chest pain.  Gastrointestinal:  Positive for nausea and vomiting. Negative for abdominal pain, constipation and diarrhea.  Genitourinary: Negative.  Negative for dysuria, vaginal bleeding and vaginal discharge.  Neurological: Negative.  Negative for dizziness and headaches.  Physical Exam   Blood  pressure (!) 176/108, pulse 65, temperature 98.3 F (36.8 C), temperature source Oral, resp. rate 16, last menstrual period 01/19/2021, SpO2 97 %, not currently breastfeeding.  Patient Vitals for the past 24 hrs:  BP Temp Temp src Pulse Resp SpO2  03/31/21 1925 139/78 -- -- -- -- --  03/31/21 1923 139/78 -- -- 71 -- --  03/31/21 1916 (!) 161/95 -- -- 71 -- --  03/31/21 1901 (!) 158/91 -- -- 71 -- --  03/31/21 1846 (!) 161/97 -- -- 82 -- --  03/31/21 1831 (!) 156/81 -- -- 73 -- --  03/31/21 1816 (!) 147/82 -- -- 74 -- --  03/31/21 1801 (!) 141/81 -- -- 87 -- --  03/31/21 1747 (!) 177/105 -- -- 63 -- --  03/31/21 1701 (!) 176/90 -- -- 60 -- --  03/31/21 1609 (!) 168/104 -- -- (!) 59 -- --  03/31/21 1501 (!) 172/102 -- -- 60 -- --  03/31/21 1445 (!) 166/98 -- -- (!) 59 -- --  03/31/21 1341 (!) 176/108 98.3 F (36.8 C) Oral 65 16 97 %     Physical Exam Vitals and nursing note reviewed.  Constitutional:      General: She is not in acute distress.    Appearance: She is well-developed.  HENT:     Head: Normocephalic.  Eyes:     Pupils: Pupils are equal, round, and reactive to light.  Cardiovascular:     Rate and Rhythm: Normal rate and regular rhythm.     Heart sounds: Normal heart sounds.  Pulmonary:     Effort: Pulmonary effort is normal. No respiratory distress.     Breath sounds: Normal breath sounds.  Abdominal:     General: Bowel sounds are normal. There is no distension.     Palpations: Abdomen is soft.     Tenderness: There is no abdominal tenderness.  Skin:    General: Skin is warm and dry.  Neurological:     Mental Status: She is alert and oriented to person, place, and time.  Psychiatric:        Mood and Affect: Mood normal.        Behavior: Behavior normal.        Thought Content: Thought content normal.        Judgment: Judgment normal.    MAU Course  Procedures Results for orders placed or performed during the hospital encounter of 03/31/21 (from the past  24 hour(s))  Urinalysis, Routine w reflex microscopic Urine, Clean Catch     Status: Abnormal   Collection Time: 03/31/21  1:30 PM  Result Value Ref Range   Color, Urine YELLOW YELLOW   APPearance CLEAR CLEAR   Specific Gravity, Urine >1.030 (H) 1.005 - 1.030   pH 6.0 5.0 - 8.0   Glucose, UA NEGATIVE NEGATIVE mg/dL   Hgb urine dipstick NEGATIVE NEGATIVE   Bilirubin Urine SMALL (A) NEGATIVE   Ketones, ur NEGATIVE NEGATIVE mg/dL   Protein, ur 100 (A) NEGATIVE mg/dL   Nitrite NEGATIVE NEGATIVE   Leukocytes,Ua NEGATIVE NEGATIVE  Urinalysis, Microscopic (reflex)     Status: Abnormal   Collection Time: 03/31/21  1:30 PM  Result Value Ref Range   RBC / HPF 6-10 0 - 5 RBC/hpf   WBC, UA 0-5 0 - 5 WBC/hpf   Bacteria, UA RARE (A) NONE SEEN   Squamous Epithelial / LPF 6-10 0 - 5   Mucus PRESENT   CBC     Status: None   Collection Time: 03/31/21  2:37 PM  Result Value Ref Range   WBC 8.8 4.0 - 10.5 K/uL   RBC 4.18 3.87 - 5.11 MIL/uL   Hemoglobin 13.3 12.0 - 15.0 g/dL   HCT 38.8 36.0 - 46.0 %   MCV 92.8 80.0 - 100.0 fL   MCH 31.8 26.0 - 34.0 pg   MCHC 34.3 30.0 - 36.0 g/dL   RDW 12.4 11.5 - 15.5 %   Platelets 304 150 - 400 K/uL   nRBC 0.0 0.0 - 0.2 %  Comprehensive metabolic  panel     Status: Abnormal   Collection Time: 03/31/21  2:37 PM  Result Value Ref Range   Sodium 136 135 - 145 mmol/L   Potassium 3.2 (L) 3.5 - 5.1 mmol/L   Chloride 103 98 - 111 mmol/L   CO2 25 22 - 32 mmol/L   Glucose, Bld 88 70 - 99 mg/dL   BUN 10 6 - 20 mg/dL   Creatinine, Ser 0.68 0.44 - 1.00 mg/dL   Calcium 9.1 8.9 - 10.3 mg/dL   Total Protein 6.4 (L) 6.5 - 8.1 g/dL   Albumin 4.0 3.5 - 5.0 g/dL   AST 17 15 - 41 U/L   ALT 18 0 - 44 U/L   Alkaline Phosphatase 43 38 - 126 U/L   Total Bilirubin 0.8 0.3 - 1.2 mg/dL   GFR, Estimated >60 >60 mL/min   Anion gap 8 5 - 15  TSH     Status: Abnormal   Collection Time: 03/31/21  2:37 PM  Result Value Ref Range   TSH 11.272 (H) 0.350 - 4.500 uIU/mL  hCG,  quantitative, pregnancy     Status: Abnormal   Collection Time: 03/31/21  2:37 PM  Result Value Ref Range   hCG, Beta Chain, Quant, S 59,156 (H) <5 mIU/mL    US OB Transvaginal  Result Date: 03/31/2021 CLINICAL DATA:  30 year old pregnant female-assess viability as unable to Doppler heart tones clinically. Assigned gestational age of [redacted] weeks 6 days by prior ultrasound. EXAM: TRANSVAGINAL OB ULTRASOUND TECHNIQUE: Transvaginal ultrasound was performed for complete evaluation of the gestation as well as the maternal uterus, adnexal regions, and pelvic cul-de-sac. COMPARISON:  03/08/2021 FINDINGS: Intrauterine gestational sac: Single Yolk sac:  Visualized. Embryo:  Visualized. Cardiac Activity: Visualized. Heart Rate: 173 bpm CRL:   32.4 mm   10 w 1 d Subchorionic hemorrhage:  None visualized. Maternal uterus/adnexae: The ovaries bilaterally are unremarkable. No evidence of free fluid or adnexal mass. IMPRESSION: 1. Single living intrauterine gestation with assigned gestational age of [redacted] weeks 6 days (by prior ultrasound). 2. No evidence of subchorionic hemorrhage. 3. Unremarkable ovaries. Electronically Signed   By: Margarette Canada M.D.   On: 03/31/2021 15:21     MDM UA  CBC, CMP, TSH, HCG  T4, free added with abnormal TSH- patient reports she ran out of synthroid 5 months ago and has not seen an endocrinologist since. She states her primary care was sending a referral for endocrinology. Per chart review, unable to see any recent primary care visits or active referrals  US OB Transvaginal  Patient states she is unable to take Procardia XL because of similar symptoms as the labetalol. She reports profuse nausea and vomiting as well as feeling like her heart was racing.  Consulted with Dr. Damita Dunnings given intolerances to medication and severe BPs- recommends consult with cardiology to determine which medication can be given. Initially believed methyldopa would be reasonable option but medication is currently  unavailable due to manufacturer discontinuation. Will treat with IV antihypertensives for immediate intervention  Patient with hx of arm amputation- limited options for IV access  IV team consult- 2+ hours of attempts to place IV   Dr. Virgina Jock from cardiology consulted regarding medication plan- discussed primary options in pregnancy being hydralazine, procardia, labetalol and methyldopa. Encouraged team to discuss which option is most tolerated by patient and consider more antiemetics for premedication before administration of antihypertensives. Will plan to see patient during the pregnancy to assist with medication management  Given difficulty with  IV placement, discussed PO antiemetics. Patient agreeable. Patient with intolerance to both compazine and reglan. Will start with scopolamine patch and zofran.   Lengthy discussion about recommendations from cardiologist. Patient states she has not had procardia in several years and reports taking 60mg  XL BID. Discussed with patient attempting 10mg  of short acting Procardia PO while in MAU to assess BP response and patient's ability to tolerate the medication. Patient agreeable to plan of care.   Scop patch Zofran  IV team achieved access. Fluids and medications started.   LR bolus Pepcid  Dr. Virgina Jock updated on plan of care and agrees.   Procardia 10mg  PO given. BPs cycled and RN at bedside to assess for side effects.   BPs now 140s-150s over 90s. Patient reports a headache. Discussed this is a possible side effect of both stabilizing her BP and the Procardia. Tylenol PO given and management options for home reviewed  Home dose of hydralazine given  Reviewed results of medication and labs with Dr. Damita Dunnings- will restart patient's previous dose of synthroid and recheck thyroid labs in 2-4 weeks. Instructed to watch BP for 2 more readings and if patient remains stable, she can be discharged home with follow up as scheduled tomorrow.    Assessment and Plan   1. Nausea/vomiting in pregnancy   2. Chronic hypertension affecting pregnancy   3. [redacted] weeks gestation of pregnancy   4. Hypothyroidism due to Hashimoto's thyroiditis   5. Nausea and vomiting during pregnancy    -Discharge home in stable condition  -Rx for synthroid, procardia, phenergan, scopolamine and zofran sent to patient's pharmacy. Pharmacy on record not open 24 hours and CNM encouraged patient to have sent to 24 hour pharmacy. Patient states regardless of where medication is sent tonight, she will not be able to pick up her medication until tomorrow. Patient states she is not driving and will have to [place]. She is adamant that she will not pick up meds tonight. CNM emphasized importance of picking up medication as soon as possible to maintain antihypertensive regimen discussed. Patient states she will pick them up tomorrow before her appointment.   -Urgent referrals placed for both [facility] cardiology and endocrinology  -First trimester precautions discussed -Patient advised to follow-up with OB as scheduled tomorrow morning. CNM emphasized importance of keeping scheduled OB appointments, especially given new medication regimen started tonight.  -Patient may return to MAU as needed or if her condition were to change or worsen   Wende Mott CNM 03/31/2021, 1:44 PM

## 2021-03-31 NOTE — MAU Note (Signed)
Last BP pt lying on BP cuff-moved to opposite arm BP 139/78

## 2021-04-01 ENCOUNTER — Encounter: Payer: Self-pay | Admitting: Obstetrics and Gynecology

## 2021-04-01 ENCOUNTER — Other Ambulatory Visit (HOSPITAL_COMMUNITY)
Admission: RE | Admit: 2021-04-01 | Discharge: 2021-04-01 | Disposition: A | Discharge status: Home / Self Care | Payer: Commercial Managed Care - HMO | Source: Ambulatory Visit | Attending: Obstetrics and Gynecology | Admitting: Obstetrics and Gynecology

## 2021-04-01 ENCOUNTER — Ambulatory Visit (INDEPENDENT_AMBULATORY_CARE_PROVIDER_SITE_OTHER): Payer: Medicaid Other | Admitting: Obstetrics and Gynecology

## 2021-04-01 VITALS — BP 167/112 | HR 70 | Wt 210.0 lb

## 2021-04-01 DIAGNOSIS — J4540 Moderate persistent asthma, uncomplicated: Secondary | ICD-10-CM

## 2021-04-01 DIAGNOSIS — I10 Essential (primary) hypertension: Secondary | ICD-10-CM | POA: Diagnosis not present

## 2021-04-01 DIAGNOSIS — E063 Autoimmune thyroiditis: Secondary | ICD-10-CM

## 2021-04-01 DIAGNOSIS — O99341 Other mental disorders complicating pregnancy, first trimester: Secondary | ICD-10-CM

## 2021-04-01 DIAGNOSIS — Z98891 History of uterine scar from previous surgery: Secondary | ICD-10-CM

## 2021-04-01 DIAGNOSIS — O0990 Supervision of high risk pregnancy, unspecified, unspecified trimester: Secondary | ICD-10-CM

## 2021-04-01 DIAGNOSIS — E038 Other specified hypothyroidism: Secondary | ICD-10-CM

## 2021-04-01 DIAGNOSIS — O09899 Supervision of other high risk pregnancies, unspecified trimester: Secondary | ICD-10-CM

## 2021-04-01 DIAGNOSIS — O09299 Supervision of pregnancy with other poor reproductive or obstetric history, unspecified trimester: Secondary | ICD-10-CM | POA: Diagnosis not present

## 2021-04-01 DIAGNOSIS — Z3009 Encounter for other general counseling and advice on contraception: Secondary | ICD-10-CM

## 2021-04-01 DIAGNOSIS — F32A Depression, unspecified: Secondary | ICD-10-CM

## 2021-04-01 MED ORDER — LABETALOL HCL 200 MG PO TABS
200.0000 mg | ORAL_TABLET | Freq: Three times a day (TID) | ORAL | 3 refills | Status: DC
Start: 2021-04-01 — End: 2021-04-12

## 2021-04-01 MED ORDER — ASPIRIN EC 81 MG PO TBEC: 81.0000 mg | DELAYED_RELEASE_TABLET | Freq: Every day | ORAL | 2 refills | Status: DC

## 2021-04-01 NOTE — Progress Notes (Signed)
Subjective:  Krystal [name] is a 31 y.o. [id number] at [redacted]w[redacted]d being seen today for first OB appt. EDD by LMP and confirmed by first trimester U/S.    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  Patient reports nausea.  Contractions: Not present. Vag. Bleeding: None.  Movement: Absent. Denies leaking of fluid.   The following portions of the patient's history were reviewed and updated as appropriate: allergies, current medications, past family history, past medical history, past social history, past surgical history and problem list. Problem list updated.  Objective:   Vitals:   04/01/21 0912 04/01/21 0938  BP: (!) 165/106 (!) 167/112  Pulse: 74 70  Weight: 210 lb (95.3 kg)     Fetal Status:     Movement: Absent     General:  Alert, oriented and cooperative. Patient is in no acute distress.  Skin: Skin is warm and dry. No rash noted.   Cardiovascular: Normal heart rate noted  Respiratory: Normal respiratory effort, no problems with respiration noted  Abdomen: Soft, gravid, appropriate for gestational age. Pain/Pressure: Absent      Pelvic:  Cervical exam performed        Extremities: Normal range of motion.  Edema: None  Mental Status: Normal mood and affect. Normal behavior. Normal judgment and thought content.   Urinalysis:      Assessment and Plan:  Pregnancy: [id number] at [redacted]w[redacted]d  1. Supervision of high risk pregnancy, antepartum Prenatal care and labs reviewed with pt. Genetic testing reviewed - Cytology - PAP( [place]) - Culture, OB Urine - [place] MFM OB DETAIL +14 WK; Future - Genetic Screening - Hemoglobin A1c - CBC/D/Plt+RPR+Rh+ABO+RubIgG... - Protein / creatinine ratio, urine  2. Essential hypertension Discussed CHTN and pregnancy.  Will adjust BP medication  Start qd BASA, indications reviewed. Serial growth and antenatal test reviewed - Protein / creatinine ratio, urine  3. History of HELLP syndrome, currently pregnant See above  4. History of uterine scar from previous surgery Desires repeat Discuss in more detail at later appts  5. Hypothyroidism due to Hashimoto's thyroiditis Synthgyroid increase yesterday, Check TSH in 4-6 weeks and adjust accordingly  6. Depression during pregnancy in first trimester Stable on current regiment Has been referred to psych by PCP  7. History of preterm delivery, currently pregnant Each due to [facility] with [place]  8. Moderate persistent asthma, unspecified whether complicated Stable and followed by Allergists.    9. Unwanted fertility BTL  papers at later date  Preterm labor symptoms and general obstetric precautions including but not limited to vaginal bleeding, contractions, leaking of fluid and fetal movement were reviewed in detail with the patient. Please refer to After Visit Summary for other counseling recommendations.  Return in about 2 weeks (around 04/15/2021) for OB visit, face to face, MD only.   Chancy Milroy, MD

## 2021-04-01 NOTE — Patient Instructions (Signed)
First Trimester of Pregnancy °The first trimester of pregnancy starts on the first day of your last menstrual period until the end of week 12. This is months 1 through 3 of pregnancy. A week after a sperm fertilizes an egg, the egg will implant into the wall of the uterus and begin to develop into a baby. By the end of 12 weeks, all the baby's organs will be formed and the baby will be 2-3 inches in size. °Body changes during your first trimester °Your body goes through many changes during pregnancy. The changes vary and generally return to normal after your baby is born. °Physical changes °You may gain or lose weight. °Your breasts may begin to grow larger and become tender. The tissue that surrounds your nipples (areola) may become darker. °Dark spots or blotches (chloasma or mask of pregnancy) may develop on your face. °You may have changes in your hair. These can include thickening or thinning of your hair or changes in texture. °Health changes °You may feel nauseous, and you may vomit. °You may have heartburn. °You may develop headaches. °You may develop constipation. °Your gums may bleed and may be sensitive to brushing and flossing. °Other changes °You may tire easily. °You may urinate more often. °Your menstrual periods will stop. °You may have a loss of appetite. °You may develop cravings for certain kinds of food. °You may have changes in your emotions from day to day. °You may have more vivid and strange dreams. °Follow these instructions at home: °Medicines °Follow your health care provider's instructions regarding medicine use. Specific medicines may be either safe or unsafe to take during pregnancy. Do not take any medicines unless told to by your health care provider. °Take a prenatal vitamin that contains at least 600 micrograms (mcg) of folic acid. °Eating and drinking °Eat a healthy diet that includes fresh fruits and vegetables, whole grains, good sources of protein such as meat, eggs, or tofu,  and low-fat dairy products. °Avoid raw meat and unpasteurized juice, milk, and cheese. These carry germs that can harm you and your baby. °If you feel nauseous or you vomit: °Eat 4 or 5 small meals a day instead of 3 large meals. °Try eating a few soda crackers. °Drink liquids between meals instead of during meals. °You may need to take these actions to prevent or treat constipation: °Drink enough fluid to keep your urine pale yellow. °Eat foods that are high in fiber, such as beans, whole grains, and fresh fruits and vegetables. °Limit foods that are high in fat and processed sugars, such as fried or sweet foods. °Activity °Exercise only as directed by your health care provider. Most people can continue their usual exercise routine during pregnancy. Try to exercise for 30 minutes at least 5 days a week. °Stop exercising if you develop pain or cramping in the lower abdomen or lower back. °Avoid exercising if it is very hot or humid or if you are at high altitude. °Avoid heavy lifting. °If you choose to, you may have sex unless your health care provider tells you not to. °Relieving pain and discomfort °Wear a good support bra to relieve breast tenderness. °Rest with your legs elevated if you have leg cramps or low back pain. °If you develop bulging veins (varicose veins) in your legs: °Wear support hose as told by your health care provider. °Elevate your feet for 15 minutes, 3-4 times a day. °Limit salt in your diet. °Safety °Wear your seat belt at all times when driving   or riding in a car. °Talk with your health care provider if someone is verbally or physically abusive to you. °Talk with your health care provider if you are feeling sad or have thoughts of hurting yourself. °Lifestyle °Do not use hot tubs, steam rooms, or saunas. °Do not douche. Do not use tampons or scented sanitary pads. °Do not use herbal remedies, alcohol, illegal drugs, or medicines that are not approved by your health care provider. Chemicals  in these products can harm your baby. °Do not use any products that contain nicotine or tobacco, such as cigarettes, e-cigarettes, and chewing tobacco. If you need help quitting, ask your health care provider. °Avoid cat litter boxes and soil used by cats. These carry germs that can cause birth defects in the baby and possibly loss of the unborn baby (fetus) by miscarriage or stillbirth. °General instructions °During routine prenatal visits in the first trimester, your health care provider will do a physical exam, perform necessary tests, and ask you how things are going. Keep all follow-up visits. This is important. °Ask for help if you have counseling or nutritional needs during pregnancy. Your health care provider can offer advice or refer you to specialists for help with various needs. °Schedule a dentist appointment. At home, brush your teeth with a soft toothbrush. Floss gently. °Write down your questions. Take them to your prenatal visits. °Where to find more information °American Pregnancy Association: americanpregnancy.org °American College of Obstetricians and Gynecologists: acog.org/en/Womens%20Health/Pregnancy °Office on Women's Health: womenshealth.gov/pregnancy °Contact a health care provider if you have: °Dizziness. °A fever. °Mild pelvic cramps, pelvic pressure, or nagging pain in the abdominal area. °Nausea, vomiting, or diarrhea that lasts for 24 hours or longer. °A bad-smelling vaginal discharge. °Pain when you urinate. °Known exposure to a contagious illness, such as chickenpox, measles, Zika virus, HIV, or hepatitis. °Get help right away if you have: °Spotting or bleeding from your vagina. °Severe abdominal cramping or pain. °Shortness of breath or chest pain. °Any kind of trauma, such as from a fall or a car crash. °New or increased pain, swelling, or redness in an arm or leg. °Summary °The first trimester of pregnancy starts on the first day of your last menstrual period until the end of week  12 (months 1 through 3). °Eating 4 or 5 small meals a day rather than 3 large meals may help to relieve nausea and vomiting. °Do not use any products that contain nicotine or tobacco, such as cigarettes, e-cigarettes, and chewing tobacco. If you need help quitting, ask your health care provider. °Keep all follow-up visits. This is important. °This information is not intended to replace advice given to you by your health care provider. Make sure you discuss any questions you have with your health care provider. °Document Revised: 07/20/2019 Document Reviewed: 05/26/2019 °Elsevier Patient Education © 2022 [employer] ° °

## 2021-04-02 LAB — CBC/D/PLT+RPR+RH+ABO+RUBIGG...
Antibody Screen: NEGATIVE
Basophils Absolute: 0.1 10*3/uL (ref 0.0–0.2)
Basos: 1 %
EOS (ABSOLUTE): 0 10*3/uL (ref 0.0–0.4)
Eos: 0 %
HCV Ab: <0.1 s/co ratio (ref 0.0–0.9)
HIV Screen 4th Generation wRfx: NONREACTIVE
Hematocrit: 45.5 % (ref 34.0–46.6)
Hemoglobin: 15.7 g/dL (ref 11.1–15.9)
Hepatitis B Surface Ag: NEGATIVE
Immature Grans (Abs): 0 10*3/uL (ref 0.0–0.1)
Immature Granulocytes: 0 %
Lymphocytes Absolute: 1.9 10*3/uL (ref 0.7–3.1)
Lymphs: 23 %
MCH: 32.4 pg (ref 26.6–33.0)
MCHC: 34.5 g/dL (ref 31.5–35.7)
MCV: 94 fL (ref 79–97)
Monocytes Absolute: 0.7 10*3/uL (ref 0.1–0.9)
Monocytes: 9 %
Neutrophils Absolute: 5.6 10*3/uL (ref 1.4–7.0)
Neutrophils: 67 %
Platelets: 348 10*3/uL (ref 150–450)
RBC: 4.85 x10E6/uL (ref 3.77–5.28)
RDW: 12.3 % (ref 11.7–15.4)
RPR Ser Ql: NONREACTIVE
Rh Factor: POSITIVE
Rubella Antibodies, IGG: 12.1 index (ref >0.99)
WBC: 8.3 10*3/uL (ref 3.4–10.8)

## 2021-04-02 LAB — HEMOGLOBIN A1C
Est. average glucose Bld gHb Est-mCnc: 100 mg/dL
Hgb A1c MFr Bld: 5.1 % (ref 4.8–5.6)

## 2021-04-02 LAB — CYTOLOGY - PAP
Chlamydia: NEGATIVE
Comment: NEGATIVE
Comment: NEGATIVE
Comment: NORMAL
Diagnosis: NEGATIVE
High risk HPV: NEGATIVE
Neisseria Gonorrhea: NEGATIVE

## 2021-04-02 LAB — HCV INTERPRETATION

## 2021-04-02 LAB — URINE CULTURE, OB REFLEX

## 2021-04-02 LAB — PROTEIN / CREATININE RATIO, URINE
Creatinine, Urine: 524.8 mg/dL
Protein, Ur: 99.9 mg/dL
Protein/Creat Ratio: 190 mg/g creat (ref 0–200)

## 2021-04-02 LAB — CULTURE, OB URINE

## 2021-04-09 ENCOUNTER — Encounter: Payer: Self-pay | Admitting: Internal Medicine

## 2021-04-09 ENCOUNTER — Ambulatory Visit (INDEPENDENT_AMBULATORY_CARE_PROVIDER_SITE_OTHER): Payer: Managed Care, Other (non HMO) | Admitting: Internal Medicine

## 2021-04-09 ENCOUNTER — Other Ambulatory Visit: Payer: Self-pay

## 2021-04-09 VITALS — BP 138/98 | HR 71 | Ht 71.0 in | Wt 220.6 lb

## 2021-04-09 DIAGNOSIS — O99281 Endocrine, nutritional and metabolic diseases complicating pregnancy, first trimester: Secondary | ICD-10-CM

## 2021-04-09 DIAGNOSIS — E039 Hypothyroidism, unspecified: Secondary | ICD-10-CM

## 2021-04-09 NOTE — Progress Notes (Signed)
Patient ID: Krystal [name], female   DOB: [birth year], 31 y.o.   MRN: [id number]  This visit occurred during the SARS-CoV-2 public health emergency.  Safety protocols were in place, including screening questions prior to the visit, additional usage of staff PPE, and extensive cleaning of exam room while observing appropriate contact time as indicated for disinfecting solutions.   HPI  Krystal [name] is a 31 y.o.-year-old female, referred by Wende Mott, CNM, for management of Hashimoto's hypothyroidism -during pregnancy.  Patient had a positive urine pregnancy test on 02/20/2021 (11 weeks).  Pregnancy was confirmed and estimated due date is 10/26/2021.  Pt. has been dx with hypothyroidism in 2020 after her pregnancy >> on Levothyroxine 112 mcg now, just restarted this 1 week ago after 2-3 months off - ran out 10/2020, as she changed doctors.  She takes the thyroid hormone: - fasting - with water - coffee + creamer + sweetener  - 15 min after! - separated by 1h from b'fast  - + calcium at lunchtime - no iron, PPIs  - + Pepcid at lunchtime - + prenatal multivitamins at lunchtime  I reviewed pt's thyroid tests: Lab Results  Component Value Date   TSH 11.272 (H) 03/31/2021   TSH 3.040 01/25/2021   FREET4 0.84 03/31/2021  07/19/2020: TSH 3.68 04/05/2020: TSH 2.69  Antithyroid antibodies: No results found for: THGAB No components found for: TPOAB  Pt describes: - + nausea, vomiting - + fatigue - started  before she restarted LT4 - + cold (rigors) and heat intolerance  - + weight gain - + depression (improved) /+ anxiety (increased)/+ irritability - + dry skin - no constipation - improved hair loss  Pt denies feeling nodules in neck, hoarseness, dysphagia/odynophagia, but has some SOB with lying down.  She has + FH of thyroid disorders: 2 older sister with hypothyroidism, hyperthyroidism. No FH of thyroid cancer.  No h/o radiation tx to head or neck. No recent use of  iodine supplements.  Pt. also has a history of essential hypertension, PTSD, GERD, interstitial cystitis, asthma, fibromyalgia, chronic fatigue, primary insomnia, anxiety, ADD.  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  ROS: Constitutional: + see HPI, + increased appetite, + nocturia, + poor sleep Eyes: no blurry vision, no xerophthalmia ENT: no sore throat, no nodules palpated in throat, no dysphagia/odynophagia, no hoarseness, + tinnitus Cardiovascular: no CP/+ SOB/no palpitations/leg swelling Respiratory: no cough/+ SOB Gastrointestinal: + N/+ V/no D/no C/+ heartburn Musculoskeletal: no muscle/+ joint aches Skin: no rashes Neurological: no tremors/numbness/tingling/dizziness, + headache + Low libido  Past Medical History:  Diagnosis Date   Acute acalculous cholecystitis s/p lap cholecystectomy 05/11/2020 05/11/2020   Angio-edema 01/15/2021   Anxiety    Asthma    HELLP (hemolytic anemia/elev liver enzymes/low platelets in pregnancy) 12/08/2017   IC (interstitial cystitis)    Kidney stones    Migraines    Severe uncontrolled hypertension 10/08/2017   UTI (urinary tract infection)    Past Surgical History:  Procedure Laterality Date   APPENDECTOMY     BIOPSY  05/10/2020   Procedure: BIOPSY;  Surgeon: Arta Silence, MD;  Location: [facility];  Service: Endoscopy;;   CESAREAN SECTION N/A 12/06/2017   Procedure: CESAREAN SECTION;  Surgeon: Sloan Leiter, MD;  Location: [place];  Service: Obstetrics;  Laterality: N/A;   CHOLECYSTECTOMY N/A 05/11/2020   Procedure: SINGLE SITE LAPAROSCOPIC CHOLECYSTECTOMY AND LIVER BIOSY;  Surgeon: Michael Boston, MD;  Location: [facility];  Service: General;  Laterality: N/A;   ESOPHAGOGASTRODUODENOSCOPY (EGD) WITH PROPOFOL N/A 05/10/2020   Procedure: ESOPHAGOGASTRODUODENOSCOPY (EGD) WITH PROPOFOL;  Surgeon: Arta Silence, MD;  Location: [facility];  Service: Endoscopy;  Laterality: N/A;   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   HAND RECONSTRUCTION Right    INCISION AND DRAINAGE     NASAL SEPTUM SURGERY     Social History   Socioeconomic History   Marital status: Married    Spouse name: Not on file   Number of children: Not on file   Years of education: Not on file   Highest education level: Not on file  Occupational History   Not on file  Tobacco Use   Smoking status: Never   Smokeless tobacco: Never   Tobacco comments:    She tried a cigarette and never smoked again  Vaping Use   Vaping Use: Never used  Substance and Sexual Activity   Alcohol use: Not Currently    Comment: bottle of wine a week-occ   Drug use: No   Sexual activity: Yes  Other Topics Concern   Not on file  Social History Narrative   Not on file   Social Determinants of Health   Financial Resource Strain: Not on file  Food Insecurity: No Food Insecurity   Worried About Running Out of Food in the Last Year: Never true   [place] in the Last Year: Never true  Transportation Needs: No Transportation Needs   Lack of Transportation (Medical): No   Lack of Transportation (Non-Medical): No  Physical Activity: Not on file  Stress: Not on file  Social Connections: Not on file  Intimate Partner Violence: Not on file   Current Outpatient Medications on File Prior to Visit  Medication Sig Dispense Refill   acetaminophen (TYLENOL) 500 MG tablet Take 2 tablets (1,000 mg total) by mouth every 6 (six) hours as needed. 30 tablet 0   albuterol (PROVENTIL HFA;VENTOLIN HFA) 108 (90 Base) MCG/ACT inhaler Inhale 2 puffs into the lungs every 6 (six) hours as needed for wheezing or shortness of breath.     aspirin EC 81 MG tablet Take 1 tablet (81 mg total) by mouth daily. Take after 12 weeks for prevention of preeclampsia later in pregnancy 300 tablet 2   budesonide-formoterol (SYMBICORT) 80-4.5 MCG/ACT inhaler Inhale 2 puffs into  the lungs in the morning and at bedtime. Take for 1-2 weeks at a time during asthma flare. Rinse mouth after each use. 1 each 3   cetirizine (ZYRTEC ALLERGY) 10 MG tablet Take 1 tablet (10 mg total) by mouth 2 (two) times daily. 60 tablet 5   cholecalciferol (VITAMIN D3) 25 MCG (1000 UNIT) tablet Take 1,000 Units by mouth daily.     EPINEPHrine 0.3 mg/0.3 mL IJ SOAJ injection Inject 0.3 mg into the muscle as needed for anaphylaxis. (Patient not taking: Reported on 04/01/2021) 1 each 2   famotidine (PEPCID) 20 MG tablet Take 1 tablet (20 mg total) by mouth 2 (two) times daily. 60 tablet 5   hydrALAZINE (APRESOLINE) 100 MG tablet Take 1 tablet (100 mg total) by mouth 3 (three) times daily. 90 tablet 1   labetalol (NORMODYNE) 200 MG tablet Take 1 tablet (200 mg total) by mouth 3 (three) times daily. 60 tablet 3   levothyroxine (SYNTHROID) 112 MCG tablet Take 1 tablet (112 mcg total) by mouth daily before breakfast. 30 tablet 5  ondansetron (ZOFRAN-ODT) 4 MG disintegrating tablet Take 1 tablet (4 mg total) by mouth every 8 (eight) hours as needed for nausea or vomiting. 30 tablet 2   Prenatal Multivit-Min-Fe-FA (PRE-NATAL PO) Take by mouth.     promethazine (PHENERGAN) 25 MG tablet Take 1 tablet (25 mg total) by mouth every 6 (six) hours as needed for nausea or vomiting. 30 tablet 2   scopolamine (TRANSDERM-SCOP) 1 MG/3DAYS Place 1 patch (1.5 mg total) onto the skin every 3 (three) days. 10 patch 12   venlafaxine XR (EFFEXOR-XR) 150 MG 24 hr capsule Take 150 mg by mouth daily.     No current facility-administered medications on file prior to visit.   Allergies  Allergen Reactions   Red Dye Other (See Comments)    ALL CAUSE PROBLEMS #40 IS WORST-MIGRAINES    Compazine [Prochlorperazine Maleate] Other (See Comments)    TWITCHING, CANT STAY STILL. Pt states she can tolerate promethazine   Other Other (See Comments)    FANSIDAR FOR MALARIA-ASTHMA   Vicodin [Hydrocodone-Acetaminophen] Itching, Nausea  And Vomiting and Other (See Comments)    Pt states she can tolerate acetaminophen   Amitriptyline Hcl Other (See Comments)    Angry moods   Duloxetine Hcl Other (See Comments)    Passing out, trimble, irritable   Reglan [Metoclopramide] Other (See Comments)    restless   Nifedipine Nausea And Vomiting and Other (See Comments)    Discussed with patient and maternal fetal medicine, patient has intolerance but not frank allergy.   Family History  Problem Relation Age of Onset   Hypertension Mother    Hypertension Father     PE: BP (!) 138/98 (BP Location: Right Arm, Patient Position: Sitting, Cuff Size: Normal)    Pulse 71    Ht 5\' 11"  (1.803 m)    Wt 220 lb 9.6 oz (100.1 kg)    LMP 01/19/2021    SpO2 98%    BMI 30.77 kg/m  Wt Readings from Last 3 Encounters:  04/09/21 220 lb 9.6 oz (100.1 kg)  04/01/21 210 lb (95.3 kg)  03/08/21 212 lb 14.4 oz (96.6 kg)   Constitutional: overweight, in NAD Eyes: PERRLA, EOMI, no exophthalmos ENT: moist mucous membranes, + symmetric thyromegaly, no cervical lymphadenopathy Cardiovascular: RRR, No MRG Respiratory: CTA B Gastrointestinal: abdomen soft, NT, ND, BS+ Musculoskeletal: + absent L hand, reconstructed R hand, strength intact in all 4 Skin: moist, warm, no rashes Neurological: no tremor with outstretched hands, DTR normal in all 4  ASSESSMENT: 1.  Hashimoto's hypothyroidism - during the first trimester of pregnancy  PLAN:  1. Patient with long-standing hypothyroidism, on treatment with levothyroxine.  She describes that she had problems obtaining levothyroxine before from the previous PCP and had to change PCPs recently.  She was without her levothyroxine for 2 to 3 months.  She had a TSH checked 9 days ago and this was high, at 11, so the new PCP restarted her on the previous dose of levothyroxine, 112 mcg daily. - Before restarting levothyroxine, she started to feel very tired and very cold.  She also has other symptoms, but this may  overlap with her pregnancy, now in week 11.   - she does not appear to have a thyroid nodules, or neck compression symptoms.  She does appear to have small, bilateral, goiter on palpation.  No palpable nodules.  Also, per review of her CT chest report from 04/2020, the thyroid gland appeared normal - at today's visit we discussed about the importance  of staying on levothyroxine and to try to not run out anymore - We can continue on the 112 mcg levothyroxine dose for now - We discussed about correct intake of levothyroxine, fasting, with water, separated by at least 30 minutes from breakfast, and separated by more than 4 hours from calcium, iron, multivitamins, acid reflux medications (PPIs).  She is not taking it correctly.  She drinks coffee with creamer less than 30 minutes after she takes levothyroxine.  I advised her to move this at least 30 minutes later or skip the creamer.  She takes her calcium and multivitamins at lunchtime - we discussed about targets for TSH during pregnancy: <2.5 in the first trimester, <3 afterwards - will check thyroid tests  in 3 weeks (4 weeks from last check): TSH, free T4.  At that time, I will also order her antithyroid antibodies since she does have a history of Hashimoto's thyroiditis. - I will see her back in 2.5 months  Orders Placed This Encounter  Procedures   TSH   T4, free   Thyroglobulin antibody   Thyroid peroxidase antibody   Philemon Kingdom, MD PhD [facility]

## 2021-04-09 NOTE — Patient Instructions (Addendum)
Please continue Levothyroxine 112 mcg daily.  Take the thyroid hormone every day, with water, at least 30 minutes before breakfast, separated by at least 4 hours from: - acid reflux medications - calcium - iron - multivitamins  Please move the coffee + creamer >30 min later.  Please come back for labs in 3 weeks.  Come back for a visit in 2.5 months.

## 2021-04-10 NOTE — Progress Notes (Deleted)
Cardio-Obstetrics Clinic  New Evaluation  Date:  04/11/2021   ID:  COBY ANTROBUS, DOB 02/24/1991, MRN [id number]  PCP:  Audley Hose, MD   [facility] Providers Cardiologist:  None  Electrophysiologist:  None       Referring MD: Wende Mott, CNM   Chief Complaint: ***  History of Present Illness:    Krystal [name] is a 31 y.o. female [G5P0222] who is being seen today for the evaluation of hypertension at the request of Wende Mott, CNM.   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 She is currently on hydralazine 100mg  TID and labetalol 100mg  TID.   Prior CV Studies Reviewed: The following studies were reviewed today: ***  Past Medical History:  Diagnosis Date   Acute acalculous cholecystitis s/p lap cholecystectomy 05/11/2020 05/11/2020   Angio-edema 01/15/2021   Anxiety    Asthma    HELLP (hemolytic anemia/elev liver enzymes/low platelets in pregnancy) 12/08/2017   IC (interstitial cystitis)    Kidney stones    Migraines    Severe uncontrolled hypertension 10/08/2017   UTI (urinary tract infection)     Past Surgical History:  Procedure Laterality Date   APPENDECTOMY     BIOPSY  05/10/2020   Procedure: BIOPSY;  Surgeon: Arta Silence, MD;  Location: [facility];  Service: Endoscopy;;   CESAREAN SECTION N/A 12/06/2017   Procedure: CESAREAN SECTION;  Surgeon: Sloan Leiter, MD;  Location: [place];  Service: Obstetrics;  Laterality: N/A;   CHOLECYSTECTOMY N/A 05/11/2020   Procedure: SINGLE SITE LAPAROSCOPIC CHOLECYSTECTOMY AND LIVER BIOSY;  Surgeon: Michael Boston, MD;  Location: [facility];  Service: General;  Laterality: N/A;   ESOPHAGOGASTRODUODENOSCOPY (EGD) WITH  PROPOFOL N/A 05/10/2020   Procedure: ESOPHAGOGASTRODUODENOSCOPY (EGD) WITH PROPOFOL;  Surgeon: Arta Silence, MD;  Location: [facility];  Service: Endoscopy;  Laterality: N/A;   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     Allergies:   Red dye, Compazine [prochlorperazine maleate], Other, Vicodin [hydrocodone-acetaminophen], Amitriptyline hcl, Duloxetine hcl, Reglan [metoclopramide], and Nifedipine   Social History   Socioeconomic History   Marital status: Married    Spouse name: Not on file   Number of children: Not on file   Years of education: Not on file   Highest education level: Not on file  Occupational History   Not on file  Tobacco Use   Smoking  status: Never   Smokeless tobacco: Never   Tobacco comments:    She tried a cigarette and never smoked again  Vaping Use   Vaping Use: Never used  Substance and Sexual Activity   Alcohol use: Not Currently    Comment: bottle of wine a week-occ   Drug use: No   Sexual activity: Yes  Other Topics Concern   Not on file  Social History Narrative   Not on file   Social Determinants of Health   Financial Resource Strain: Not on file  Food Insecurity: No Food Insecurity   Worried About Running Out of Food in the Last Year: Never true   Ran Out of Food in the Last Year: Never true  Transportation Needs: No Transportation Needs   Lack of Transportation (Medical): No   Lack of  Transportation (Non-Medical): No  Physical Activity: Not on file  Stress: Not on file  Social Connections: Not on file  { Click here to update SDOH then refresh :1}    Family History  Problem Relation Age of Onset   Hypertension Mother    Hypertension Father    { Click here to update FH then refresh note    :1}   ROS:   Please see the history of present illness.    *** All other systems reviewed and are negative.   Labs/EKG Reviewed:    EKG:   EKG is *** ordered today.  The ekg ordered today demonstrates ***  Recent Labs: 05/14/2020: Magnesium 1.9 03/31/2021: ALT 18; BUN 10; Creatinine, Ser 0.68; Potassium 3.2; Sodium 136; TSH 11.272 04/01/2021: Hemoglobin 15.7; Platelets 348   Recent Lipid Panel No results found for: CHOL, TRIG, HDL, CHOLHDL, LDLCALC, LDLDIRECT  Physical Exam:    VS:  LMP 01/19/2021     Wt Readings from Last 3 Encounters:  04/09/21 220 lb 9.6 oz (100.1 kg)  04/01/21 210 lb (95.3 kg)  03/08/21 212 lb 14.4 oz (96.6 kg)     GEN: *** Well nourished, well developed in no acute distress HEENT: Normal NECK: No JVD; No carotid bruits LYMPHATICS: No lymphadenopathy CARDIAC: ***RRR, no murmurs, rubs, gallops RESPIRATORY:  Clear to auscultation without rales, wheezing or rhonchi  ABDOMEN: Soft, non-tender, non-distended MUSCULOSKELETAL:  No edema; No deformity  SKIN: Warm and dry NEUROLOGIC:  Alert and oriented x 3 PSYCHIATRIC:  Normal affect    Risk Assessment/Risk Calculators:   { Click to calculate CARPREG II - THEN refresh note :1}    { Click to caclulate Mod WHO Class of CV Risk - THEN refresh note :1}     { Click for [id number] Score - THEN Refresh Note    :[id number]}      ASSESSMENT & PLAN:    #Chronic HTN: Patient with long-standing history of hypertension initially diagnosed at age 89. Has several medication intolerances with reported nausea/vomiting to both procardia and labetalol.  -Continue hydralazine 100mg  TID -Continue  Nifedipine 60mg  TID  #History of HELLP Syndrome: #Preeclampsia: High risk p There are no Patient Instructions on file for this visit.   Dispo:  No follow-ups on file.   Medication Adjustments/Labs and Tests Ordered: Current medicines are reviewed at length with the patient today.  Concerns regarding medicines are outlined above.  Tests Ordered: No orders of the defined types were placed in this encounter.  Medication Changes: No orders of the defined types were placed in this encounter.

## 2021-04-12 ENCOUNTER — Encounter: Payer: Self-pay | Admitting: Cardiology

## 2021-04-12 ENCOUNTER — Ambulatory Visit (INDEPENDENT_AMBULATORY_CARE_PROVIDER_SITE_OTHER): Payer: Managed Care, Other (non HMO) | Admitting: Cardiology

## 2021-04-12 ENCOUNTER — Other Ambulatory Visit: Payer: Self-pay

## 2021-04-12 VITALS — BP 170/110 | HR 66 | Ht 71.0 in | Wt 219.0 lb

## 2021-04-12 DIAGNOSIS — O09299 Supervision of pregnancy with other poor reproductive or obstetric history, unspecified trimester: Secondary | ICD-10-CM

## 2021-04-12 DIAGNOSIS — O09899 Supervision of other high risk pregnancies, unspecified trimester: Secondary | ICD-10-CM | POA: Diagnosis not present

## 2021-04-12 DIAGNOSIS — O10011 Pre-existing essential hypertension complicating pregnancy, first trimester: Secondary | ICD-10-CM

## 2021-04-12 MED ORDER — LABETALOL HCL 300 MG PO TABS: 300.0000 mg | ORAL_TABLET | Freq: Three times a day (TID) | ORAL | 1 refills | Status: DC

## 2021-04-12 MED ORDER — AMLODIPINE BESYLATE 5 MG PO TABS: 5.0000 mg | ORAL_TABLET | Freq: Every day | ORAL | 1 refills | Status: DC

## 2021-04-12 MED ORDER — LABETALOL HCL 300 MG PO TABS: 300.0000 mg | ORAL_TABLET | Freq: Two times a day (BID) | ORAL | 1 refills | Status: DC

## 2021-04-12 NOTE — Patient Instructions (Signed)
Medication Instructions:   INCREASE YOUR LABETALOL TO 300 MG BY MOUTH THREE TIMES DAILY  DR. Johney Frame WANTS YOU TO GO HOME AND TAKE YOUR BLOOD PRESSURES--IF TOP NUMBER OF YOUR BP IS STILL 170 OR HIGHER, TAKE AN ADDITIONAL DOSE OF YOUR 200 MG LABETALOL YOU CURRENTLY HAVE ON HAND--IF YOUR BP IS STILL 170 AND ABOVE AFTER TAKING THAT, PLEASE GO TO [place] [facility] RO[facility] FOR TREATMENT  YOU WILL ONLY START AMLODIPINE 5 MG BY MOUTH DAILY IF YOU CANNOT TOLERATE LABETALOL INCREASE (IF IT CAUSES NAUSEA AND VOMITING)    *If you need a refill on your cardiac medications before your next appointment, please call your pharmacy*   Follow-Up:  [place] [facility]---IF NO AVAILABILITY, PLEASE SCHEDULE THE PT TO SEE CHRIS PAVERO (ONLY) IN BLOOD PRESSURE CLINIC

## 2021-04-12 NOTE — Progress Notes (Signed)
Cardio-Obstetrics Clinic  New Evaluation  Date:  04/12/2021   ID:  Krystal [name], DOB 12-20-1990, MRN [id number]  PCP:  Audley Hose, MD   [facility] Providers Cardiologist:  None  Electrophysiologist:  None       Referring MD: Wende Mott, CNM   Chief Complaint: uncontrolled HTN   History of Present Illness:    Krystal [name] is a 31 y.o. female [G5P0222] who is being seen today for the evaluation of hypertension at the request of Wende Mott, CNM.   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 She is currently on hydralazine 100mg  TID and labetalol 200 mg TID.  Patient reports that since the medication change from her primary OB provider at the initial OB visit her blood pressures have been better controlled with systolic pressures being mainly in the 130s to 903E and diastolic blood pressures in the 80s to 90s.  She reports that 3 days ago she had a stressful conversation with the family member and felt like her blood pressures were elevated so it checked them and it was 180s/120s mmHg.  That was the highest blood pressure reading she has seen since the medication adjustment.  She does report episodes of dizziness right after taking her blood pressure medications which resolved throughout the day.  Patient reports that she had a headache last night and checked her blood pressure and it was 130s/90s.  She repeated her blood pressure this morning and it was 142/92.  Denies any changes in vision, right upper quadrant pain, lower extremity swelling.   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) 12/08/2017   IC (interstitial cystitis)    Kidney stones    Migraines    Severe uncontrolled hypertension 10/08/2017   UTI (urinary tract infection)     Past Surgical History:  Procedure Laterality Date   APPENDECTOMY     BIOPSY  05/10/2020   Procedure: BIOPSY;  Surgeon: Arta Silence, MD;  Location: [facility];  Service: Endoscopy;;   CESAREAN SECTION N/A 12/06/2017   Procedure: CESAREAN SECTION;  Surgeon: Sloan Leiter, MD;  Location: [place];  Service: Obstetrics;  Laterality: N/A;   CHOLECYSTECTOMY N/A 05/11/2020   Procedure: SINGLE SITE LAPAROSCOPIC CHOLECYSTECTOMY AND LIVER BIOSY;  Surgeon: Michael Boston, MD;  Location: [facility];  Service: General;  Laterality: N/A;   ESOPHAGOGASTRODUODENOSCOPY (EGD) WITH PROPOFOL N/A 05/10/2020   Procedure: ESOPHAGOGASTRODUODENOSCOPY (EGD) WITH PROPOFOL;  Surgeon: Arta Silence, MD;  Location: [facility];  Service: Endoscopy;  Laterality: N/A;   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   albuterol (PROVENTIL HFA;VENTOLIN HFA) 108 (90 Base) MCG/ACT inhaler Inhale 2 puffs into the lungs every 6 (six) hours as needed for wheezing or shortness of breath.   amLODipine (NORVASC) 5 MG tablet Take 1 tablet (5 mg total) by mouth daily.   aspirin EC 81 MG tablet Take 1  tablet (81 mg total) by mouth daily. Take after 12 weeks for prevention of preeclampsia later in pregnancy   budesonide-formoterol (SYMBICORT) 80-4.5 MCG/ACT inhaler Inhale 2 puffs into the lungs in the morning and at bedtime. Take for 1-2 weeks at a time during asthma flare. Rinse mouth after each use.   cetirizine (ZYRTEC ALLERGY) 10 MG tablet Take 1 tablet (10 mg total) by mouth 2 (two) times daily.   cholecalciferol (VITAMIN D3) 25 MCG (1000 UNIT) tablet Take 1,000 Units by mouth daily.   EPINEPHrine 0.3 mg/0.3 mL IJ SOAJ injection Inject 0.3 mg into the muscle as needed for anaphylaxis.   famotidine (PEPCID) 20 MG tablet Take 1 tablet (20 mg total) by mouth 2 (two) times daily.   hydrALAZINE (APRESOLINE) 100 MG tablet Take 1 tablet (100 mg total) by mouth 3 (three) times daily.   labetalol (NORMODYNE) 300 MG tablet Take 1 tablet (300 mg total) by mouth 3 (three) times daily.   levothyroxine (SYNTHROID) 112 MCG tablet Take 1 tablet (112 mcg total) by mouth daily before breakfast.   ondansetron (ZOFRAN-ODT) 4 MG disintegrating tablet Take 1 tablet (4 mg total) by mouth every 8 (eight) hours as needed for nausea or vomiting.   Prenatal Multivit-Min-Fe-FA (PRE-NATAL PO) Take by mouth.   promethazine (PHENERGAN) 25 MG tablet Take 1 tablet (25 mg total) by mouth every 6 (six) hours as needed for nausea or vomiting.   scopolamine (TRANSDERM-SCOP) 1 MG/3DAYS Place 1 patch (1.5 mg total) onto the skin every 3 (three) days.   venlafaxine XR (EFFEXOR-XR) 150 MG 24 hr capsule Take 150 mg by mouth daily.   [DISCONTINUED] labetalol (NORMODYNE) 200 MG tablet Take 1 tablet (200 mg total) by mouth 3 (three) times daily.   [DISCONTINUED] labetalol (NORMODYNE) 300 MG tablet Take 1 tablet (300 mg total) by mouth 2 (two) times daily.     Allergies:   Red dye, Compazine [prochlorperazine maleate], Other, Vicodin [hydrocodone-acetaminophen], Amitriptyline hcl, Duloxetine hcl, Reglan [metoclopramide], and Nifedipine    Social History   Socioeconomic History   Marital status: Married    Spouse name: Not on file   Number of children: Not on file   Years of education: Not on file   Highest education level: Not on file  Occupational History   Not on file  Tobacco Use   Smoking status: Never   Smokeless tobacco: Never   Tobacco comments:    She tried a cigarette and never smoked again  Vaping Use   Vaping Use: Never used  Substance and Sexual Activity   Alcohol use: Not Currently    Comment: bottle of wine a week-occ   Drug use: No   Sexual activity: Yes  Other Topics Concern   Not on file  Social History Narrative   Not on file  Social Determinants of Health   Financial Resource Strain: Not on file  Food Insecurity: No Food Insecurity   Worried About Charity fundraiser in the Last Year: Never true   Ran Out of Food in the Last Year: Never true  Transportation Needs: No Transportation Needs   Lack of Transportation (Medical): No   Lack of Transportation (Non-Medical): No  Physical Activity: Not on file  Stress: Not on file  Social Connections: Not on file      Family History  Problem Relation Age of Onset   Hypertension Mother    Hypertension Father       ROS:   Please see the history of present illness.    All other systems reviewed and are negative.   Labs/EKG Reviewed:    EKG:   EKG is not ordered today.   Recent Labs: 05/14/2020: Magnesium 1.9 03/31/2021: ALT 18; BUN 10; Creatinine, Ser 0.68; Potassium 3.2; Sodium 136; TSH 11.272 04/01/2021: Hemoglobin 15.7; Platelets 348   Recent Lipid Panel No results found for: CHOL, TRIG, HDL, CHOLHDL, LDLCALC, LDLDIRECT  Physical Exam:    VS:  BP (!) 170/110    Pulse 66    Ht 5\' 11"  (1.803 m)    Wt 219 lb (99.3 kg)    LMP 01/19/2021    BMI 30.54 kg/m     Wt Readings from Last 3 Encounters:  04/12/21 219 lb (99.3 kg)  04/09/21 220 lb 9.6 oz (100.1 kg)  04/01/21 210 lb (95.3 kg)     GEN: Pleasant, alert and oriented,  no acute distress HEENT: Moist mucous membranes, EOM intact NECK: No JVD; No carotid bruits CARDIAC: Regular rate and rhythm, no murmurs appreciated RESPIRATORY: Speaking in full sentences, lungs clear to auscultation bilaterally ABDOMEN: Soft, no right upper quadrant tenderness MUSCULOSKELETAL:  No edema; 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 Has several medication intolerances with reported nausea/vomiting to both procardia and labetalol.  Patient was unable to tolerate nicardipine due to "jitteriness".  Patient has had better blood pressure control since being increased to 200 mg of labetalol 3 times daily extremely elevated today.  Repeat manual blood pressure check 186/110 mmHg -Increasing labetalol to 300 mg 3 times daily -Continue hydralazine 100 mg 3 times daily - Prescribing patient with 5 mg amlodipine daily which she is to take if her blood pressure is not controlled after the increase and labetalol  #History of HELLP Syndrome: #Preeclampsia: High risk p Patient Instructions  Medication Instructions:   INCREASE YOUR LABETALOL TO 300 MG BY MOUTH THREE TIMES DAILY  DR. Johney Frame WANTS YOU TO GO HOME AND TAKE YOUR BLOOD PRESSURES--IF TOP NUMBER OF YOUR BP IS STILL 170 OR HIGHER, TAKE AN ADDITIONAL DOSE OF YOUR 200 MG LABETALOL YOU CURRENTLY HAVE ON HAND--IF YOUR BP IS STILL 170 AND ABOVE AFTER TAKING THAT, PLEASE GO TO [place] EM[facility] RO[facility] FOR TREATMENT  YOU WILL ONLY START AMLODIPINE 5 MG BY MOUTH DAILY IF YOU CANNOT TOLERATE LABETALOL INCREASE (IF IT CAUSES NAUSEA AND VOMITING)    *If you need a refill on your cardiac medications before  your next appointment,  please call your pharmacy*   Follow-Up:  ONE WEEK WITH KARDIE TOBB DO HERE AT [facility]---IF NO AVAILABILITY, PLEASE SCHEDULE THE PT TO SEE CHRIS PAVERO (ONLY) IN BLOOD PRESSURE CLINIC       Dispo:  No follow-ups on file.   Medication Adjustments/Labs and Tests Ordered: Current medicines are reviewed at length with the patient today.  Concerns regarding medicines are outlined above.  Tests Ordered: No orders of the defined types were placed in this encounter.  Medication Changes: Meds ordered this encounter  Medications   DISCONTD: labetalol (NORMODYNE) 300 MG tablet    Sig: Take 1 tablet (300 mg total) by mouth 2 (two) times daily.    Dispense:  270 tablet    Refill:  1    Dose increase   labetalol (NORMODYNE) 300 MG tablet    Sig: Take 1 tablet (300 mg total) by mouth 3 (three) times daily.    Dispense:  270 tablet    Refill:  1    Dose increase   amLODipine (NORVASC) 5 MG tablet    Sig: Take 1 tablet (5 mg total) by mouth daily.    Dispense:  90 tablet    Refill:  1    Pt to only start this medication if labetalol is not tolerated.

## 2021-04-16 ENCOUNTER — Telehealth: Payer: Self-pay | Admitting: *Deleted

## 2021-04-16 ENCOUNTER — Encounter: Payer: Self-pay | Admitting: *Deleted

## 2021-04-16 NOTE — Telephone Encounter (Signed)
Received Telephone report from after hours nurse stating patient having hard time with bp meds. They recommended she go to ER. She did not as far as I can tell. I called her and she said she can't talk right now, she is in the store and will call back. Staci Acosta

## 2021-04-17 ENCOUNTER — Other Ambulatory Visit: Payer: Self-pay

## 2021-04-17 ENCOUNTER — Encounter: Payer: Self-pay | Admitting: Pharmacist

## 2021-04-17 ENCOUNTER — Encounter: Payer: Self-pay | Admitting: *Deleted

## 2021-04-17 ENCOUNTER — Ambulatory Visit (INDEPENDENT_AMBULATORY_CARE_PROVIDER_SITE_OTHER): Payer: Managed Care, Other (non HMO) | Admitting: Pharmacist

## 2021-04-17 VITALS — BP 135/74 | HR 88 | Resp 17 | Ht 71.0 in | Wt 223.4 lb

## 2021-04-17 DIAGNOSIS — O10011 Pre-existing essential hypertension complicating pregnancy, first trimester: Secondary | ICD-10-CM | POA: Diagnosis not present

## 2021-04-17 DIAGNOSIS — G9332 Myalgic encephalomyelitis/chronic fatigue syndrome: Secondary | ICD-10-CM | POA: Insufficient documentation

## 2021-04-17 DIAGNOSIS — R430 Anosmia: Secondary | ICD-10-CM | POA: Insufficient documentation

## 2021-04-17 DIAGNOSIS — F988 Other specified behavioral and emotional disorders with onset usually occurring in childhood and adolescence: Secondary | ICD-10-CM | POA: Insufficient documentation

## 2021-04-17 NOTE — Progress Notes (Unsigned)
Patient ID: Krystal [name]                 DOB: 1991/01/31                      MRN: [id number]     HPI: Krystal [name] is a 31 y.o. female referred by Dr. Johney Frame for HTN consult from cardio obstetrics clinic.  Patient is 12 weeks 4 days pregnant, [id number].  Has history of resistant hypertension, preeclampsia, and HELLP.  Has history of treatment resistant depression and anxiety.  Patient presents today after initially being seen by Dr Johney Frame on 04/12/21.  Blood pressure was elevated at 170/110 and labetalol was increased to 300mg  TID and was prescribed amlodipine 5 mg if she could not tolerate the labetalol dose increase.  Patient was born in [place] and moved to [place] at 31 years old. English is her second language. Parents are still living in [place] and father had a stroke and not doing well.  This is contributing to her stress and anxiety. Pt became tearful thinking about this.  Patient has frequent and recurrent nausea and vomiting occurring mostly in morning but occasionally lasting throughout the day.  Has a history of nausea throughout the entirety of her other pregnancies as well.  One of her vomiting triggers is taking medications which is making blood pressure control challenging. Upon Dr. Holland Commons recommendation, has begun eating starchy foods before taking medications such as oatmeal which has been helpful.  Patient's father in law is watching her other 2 children. She currently does not have a working car and has been relying on Uber to get to medical appointments.    Has been through many therapists in the past for her anxiety and depression but feels she needs a psychiatry referral.  Currently wakes up around 6:30AM and takes her levothyroxine.  Takes morning doses of labetalol and hydralazine around 7:30-8:00 which is the same time her other children wake up. Takes morning blood pressure reading about one hour later.  Takes midday medications between 12:30-14:00.   Evening medications are between 1900-2100.  Nausea can happen anytime after taking her medications, sometimes relieved by Zofran or promethazine but not always. Often vomits up medication.  Reports promethazine makes her exhausted.  She will often fall asleep early around 6:30pm and husband will wake her up to take evening medications.  Reports labetalol increase has occasionally made her dizzy.  Recent BP readings:  2/17: 165/108 2/18: 184/113, 200/128 2/19: 165/98, 158/78 2/20: 141/82, 136/80  Current HTN meds:  Labetalol 300mg  TID hydralazine 100 TID Has amodipine 5mg  but has not taken  Wt Readings from Last 3 Encounters:  04/17/21 223 lb 6.4 oz (101.3 kg)  04/12/21 219 lb (99.3 kg)  04/09/21 220 lb 9.6 oz (100.1 kg)   BP Readings from Last 3 Encounters:  04/17/21 135/74  04/12/21 (!) 170/110  04/09/21 (!) 138/98   Pulse Readings from Last 3 Encounters:  04/17/21 88  04/12/21 66  04/09/21 71    Renal function: Estimated Creatinine Clearance: 132.3 mL/min (by C-G formula based on SCr of 0.68 mg/dL).  Past Medical History:  Diagnosis Date   Acute acalculous cholecystitis s/p lap cholecystectomy 05/11/2020 05/11/2020   Angio-edema 01/15/2021   Anxiety    Asthma    HELLP (hemolytic anemia/elev liver enzymes/low platelets in pregnancy) 12/08/2017   IC (interstitial cystitis)    Kidney stones    Migraines    Severe uncontrolled hypertension 10/08/2017   UTI (  urinary tract infection)     Current Outpatient Medications on File Prior to Visit  Medication Sig Dispense Refill   acetaminophen (TYLENOL) 500 MG tablet Take 2 tablets (1,000 mg total) by mouth every 6 (six) hours as needed. 30 tablet 0   albuterol (PROVENTIL HFA;VENTOLIN HFA) 108 (90 Base) MCG/ACT inhaler Inhale 2 puffs into the lungs every 6 (six) hours as needed for wheezing or shortness of breath.     aspirin EC 81 MG tablet Take 1 tablet (81 mg total) by mouth daily. Take after 12 weeks for prevention of  preeclampsia later in pregnancy 300 tablet 2   budesonide-formoterol (SYMBICORT) 80-4.5 MCG/ACT inhaler Inhale 2 puffs into the lungs in the morning and at bedtime. Take for 1-2 weeks at a time during asthma flare. Rinse mouth after each use. 1 each 3   cetirizine (ZYRTEC ALLERGY) 10 MG tablet Take 1 tablet (10 mg total) by mouth 2 (two) times daily. 60 tablet 5   cholecalciferol (VITAMIN D3) 25 MCG (1000 UNIT) tablet Take 1,000 Units by mouth daily.     EPINEPHrine 0.3 mg/0.3 mL IJ SOAJ injection Inject 0.3 mg into the muscle as needed for anaphylaxis. 1 each 2   famotidine (PEPCID) 20 MG tablet Take 1 tablet (20 mg total) by mouth 2 (two) times daily. 60 tablet 5   hydrALAZINE (APRESOLINE) 100 MG tablet Take 1 tablet (100 mg total) by mouth 3 (three) times daily. 90 tablet 1   labetalol (NORMODYNE) 300 MG tablet Take 1 tablet (300 mg total) by mouth 3 (three) times daily. 270 tablet 1   levothyroxine (SYNTHROID) 112 MCG tablet Take 1 tablet (112 mcg total) by mouth daily before breakfast. 30 tablet 5   ondansetron (ZOFRAN-ODT) 4 MG disintegrating tablet Take 1 tablet (4 mg total) by mouth every 8 (eight) hours as needed for nausea or vomiting. 30 tablet 2   Prenatal Multivit-Min-Fe-FA (PRE-NATAL PO) Take by mouth.     promethazine (PHENERGAN) 25 MG tablet Take 1 tablet (25 mg total) by mouth every 6 (six) hours as needed for nausea or vomiting. 30 tablet 2   scopolamine (TRANSDERM-SCOP) 1 MG/3DAYS Place 1 patch (1.5 mg total) onto the skin every 3 (three) days. 10 patch 12   venlafaxine XR (EFFEXOR-XR) 150 MG 24 hr capsule Take 150 mg by mouth daily.     amLODipine (NORVASC) 5 MG tablet Take 1 tablet (5 mg total) by mouth daily. (Patient not taking: Reported on 04/17/2021) 90 tablet 1   No current facility-administered medications on file prior to visit.    Allergies  Allergen Reactions   Red Dye Other (See Comments)    ALL CAUSE PROBLEMS #40 IS WORST-MIGRAINES    Compazine [Prochlorperazine  Maleate] Other (See Comments)    TWITCHING, CANT STAY STILL. Pt states she can tolerate promethazine   Other Other (See Comments)    FANSIDAR FOR MALARIA-ASTHMA   Vicodin [Hydrocodone-Acetaminophen] Itching, Nausea And Vomiting and Other (See Comments)    Pt states she can tolerate acetaminophen   Amitriptyline Hcl Other (See Comments)    Angry moods   Duloxetine Hcl Other (See Comments)    Passing out, trimble, irritable   Reglan [Metoclopramide] Other (See Comments)    restless   Nifedipine Nausea And Vomiting and Other (See Comments)    Discussed with patient and maternal fetal medicine, patient has intolerance but not frank allergy.     Assessment/Plan:  1. Hypertension in Pregnancy-  Patient BP in room today 135/74 which is improved  from previous visits.  Patient was able to take midday medications today and not vomit. Blood pressure has been slowly decreasing since increase in labetalol although she does report occasional dizziness.    Patient hypertension also likely linked to stress and anxiety. Worried regarding her past pregnancy complications and her health status and lack of transportation.  Brought in LCSW who explained transportation options to patient based on her insurance plan.  Patient appreciative. Due to anxiety, requests psych referral.  Explained the duration of action of labetalol and hydralazine.  Dizziness likely due to increase in labetalol dose however meds are starting to show effectiveness. An issue is that she frequently can not keep them down. Advised that she did not have to take hydralazine and labetalol at the same time which may help her control her BP if she happens to vomit.  Created schedule for patient to help her maximize the amount of medication she gets during the day.  6:30-7:00 levothyroxine  7:30 hydralazine  8:00 labetalol  8:30   9:00 Check blood pressure  13:30   14:00 hydralazine  16:30 Labetalol  20:30   21:00 Check blood pressure   21:30   22:00 Hydralazine  22:30 labetalol   Patient will need close follow up.  No medication changes at this time. Will see if new administration schedule is helpful and recheck with patient next week. Has OB appointment tomorrow.   Continue: Labetalol 300mg  TID hydralazine 100 TID Recheck in 1 week  Karren Cobble, PharmD, [place], [place], [place], [place], [place], [address] Phone: [phone], Fax: [phone]

## 2021-04-17 NOTE — Progress Notes (Signed)
Heart and Vascular Care Navigation  04/17/2021  Krystal [name] [birth year] [id number]  Reason for Referral:  Transportation Engaged with patient face to face for initial visit for Heart and Vascular Care Coordination.                                                                                                   Assessment:          LCSW met briefly with pt at the conclusion of appt w/ PharmD. I introduced self, role, reason for visit. Pt confirms she has been taking ubers to appointments until her vehicle is ready. Pharmacy noted she has [employer] and this Probation officer provided her with number for transportation services through that coverage. Encouraged her to utilize that benefit if it is still active to save on costs of ubers. LCSW provided my card also and let pt know to reach out if any additional  challenges. She will see our office next week and knows that I am available if any questions/concerns at that time.                              HRT/VAS Care Coordination     Patients Home Cardiology Office [place] Team Social Worker   Social Worker Name: Westley Hummer, LCSW, [place] arrangements for the past 2 months Single Family Home   Lives with: Spouse; Minor Children   Patient Current Insurance Coverage Medicaid; Pharmacist, community   Patient Has Concern With Paying Medical Bills No   Does Patient Have Prescription Coverage? Yes   Home Assistive Devices/Equipment Eyeglasses; Blood pressure cuff; CBG Meter       Social History:                                                                             SDOH Screenings   Alcohol Screen: Not on file  Depression (PHQ2-9): Medium Risk   PHQ-2 Score: 12  Financial Resource Strain: Not on file  Food Insecurity: No Food Insecurity   Worried About Charity fundraiser in the Last Year: Never true   Ran Out of Food in the Last Year: Never true  Housing: Not on file   Physical Activity: Not on file  Social Connections: Not on file  Stress: Not on file  Tobacco Use: Low Risk    Smoking Tobacco Use: Never   Smokeless Tobacco Use: Never   Passive Exposure: Not on file  Transportation Needs: No Transportation Needs   Lack of Transportation (Medical): No   Lack of Transportation (Non-Medical): No    SDOH Interventions: Transportation:   Transportation Interventions: Other (Comment) ([facility])    Follow-up plan:   Provided pt with  transportation resources and my card. No active f/u at this time, remain available for pt questions/concerns as needed.

## 2021-04-17 NOTE — Patient Instructions (Addendum)
It was very nice meeting you today!  Please continue your levothyroxine, labetalol, and hydralazine as scheduled  Continue to take your blood pressure twice daily as scheduled  Please call or send a message with any questions  Karren Cobble, PharmD, [place], [place], [place], [place], [place], [address] Phone: [phone], Fax: [phone]

## 2021-04-18 ENCOUNTER — Ambulatory Visit (INDEPENDENT_AMBULATORY_CARE_PROVIDER_SITE_OTHER): Payer: Medicaid Other | Admitting: Obstetrics and Gynecology

## 2021-04-18 ENCOUNTER — Encounter: Payer: Self-pay | Admitting: Obstetrics and Gynecology

## 2021-04-18 VITALS — BP 138/86 | HR 74 | Wt 222.2 lb

## 2021-04-18 DIAGNOSIS — I10 Essential (primary) hypertension: Secondary | ICD-10-CM

## 2021-04-18 DIAGNOSIS — Z98891 History of uterine scar from previous surgery: Secondary | ICD-10-CM

## 2021-04-18 DIAGNOSIS — O09299 Supervision of pregnancy with other poor reproductive or obstetric history, unspecified trimester: Secondary | ICD-10-CM

## 2021-04-18 DIAGNOSIS — Z3009 Encounter for other general counseling and advice on contraception: Secondary | ICD-10-CM

## 2021-04-18 DIAGNOSIS — E038 Other specified hypothyroidism: Secondary | ICD-10-CM

## 2021-04-18 DIAGNOSIS — O09899 Supervision of other high risk pregnancies, unspecified trimester: Secondary | ICD-10-CM

## 2021-04-18 DIAGNOSIS — O0990 Supervision of high risk pregnancy, unspecified, unspecified trimester: Secondary | ICD-10-CM

## 2021-04-18 DIAGNOSIS — E063 Autoimmune thyroiditis: Secondary | ICD-10-CM

## 2021-04-18 NOTE — Patient Instructions (Signed)
First Trimester of Pregnancy °The first trimester of pregnancy starts on the first day of your last menstrual period until the end of week 12. This is months 1 through 3 of pregnancy. A week after a sperm fertilizes an egg, the egg will implant into the wall of the uterus and begin to develop into a baby. By the end of 12 weeks, all the baby's organs will be formed and the baby will be 2-3 inches in size. °Body changes during your first trimester °Your body goes through many changes during pregnancy. The changes vary and generally return to normal after your baby is born. °Physical changes °You may gain or lose weight. °Your breasts may begin to grow larger and become tender. The tissue that surrounds your nipples (areola) may become darker. °Dark spots or blotches (chloasma or mask of pregnancy) may develop on your face. °You may have changes in your hair. These can include thickening or thinning of your hair or changes in texture. °Health changes °You may feel nauseous, and you may vomit. °You may have heartburn. °You may develop headaches. °You may develop constipation. °Your gums may bleed and may be sensitive to brushing and flossing. °Other changes °You may tire easily. °You may urinate more often. °Your menstrual periods will stop. °You may have a loss of appetite. °You may develop cravings for certain kinds of food. °You may have changes in your emotions from day to day. °You may have more vivid and strange dreams. °Follow these instructions at home: °Medicines °Follow your health care provider's instructions regarding medicine use. Specific medicines may be either safe or unsafe to take during pregnancy. Do not take any medicines unless told to by your health care provider. °Take a prenatal vitamin that contains at least 600 micrograms (mcg) of folic acid. °Eating and drinking °Eat a healthy diet that includes fresh fruits and vegetables, whole grains, good sources of protein such as meat, eggs, or tofu,  and low-fat dairy products. °Avoid raw meat and unpasteurized juice, milk, and cheese. These carry germs that can harm you and your baby. °If you feel nauseous or you vomit: °Eat 4 or 5 small meals a day instead of 3 large meals. °Try eating a few soda crackers. °Drink liquids between meals instead of during meals. °You may need to take these actions to prevent or treat constipation: °Drink enough fluid to keep your urine pale yellow. °Eat foods that are high in fiber, such as beans, whole grains, and fresh fruits and vegetables. °Limit foods that are high in fat and processed sugars, such as fried or sweet foods. °Activity °Exercise only as directed by your health care provider. Most people can continue their usual exercise routine during pregnancy. Try to exercise for 30 minutes at least 5 days a week. °Stop exercising if you develop pain or cramping in the lower abdomen or lower back. °Avoid exercising if it is very hot or humid or if you are at high altitude. °Avoid heavy lifting. °If you choose to, you may have sex unless your health care provider tells you not to. °Relieving pain and discomfort °Wear a good support bra to relieve breast tenderness. °Rest with your legs elevated if you have leg cramps or low back pain. °If you develop bulging veins (varicose veins) in your legs: °Wear support hose as told by your health care provider. °Elevate your feet for 15 minutes, 3-4 times a day. °Limit salt in your diet. °Safety °Wear your seat belt at all times when driving   or riding in a car. °Talk with your health care provider if someone is verbally or physically abusive to you. °Talk with your health care provider if you are feeling sad or have thoughts of hurting yourself. °Lifestyle °Do not use hot tubs, steam rooms, or saunas. °Do not douche. Do not use tampons or scented sanitary pads. °Do not use herbal remedies, alcohol, illegal drugs, or medicines that are not approved by your health care provider. Chemicals  in these products can harm your baby. °Do not use any products that contain nicotine or tobacco, such as cigarettes, e-cigarettes, and chewing tobacco. If you need help quitting, ask your health care provider. °Avoid cat litter boxes and soil used by cats. These carry germs that can cause birth defects in the baby and possibly loss of the unborn baby (fetus) by miscarriage or stillbirth. °General instructions °During routine prenatal visits in the first trimester, your health care provider will do a physical exam, perform necessary tests, and ask you how things are going. Keep all follow-up visits. This is important. °Ask for help if you have counseling or nutritional needs during pregnancy. Your health care provider can offer advice or refer you to specialists for help with various needs. °Schedule a dentist appointment. At home, brush your teeth with a soft toothbrush. Floss gently. °Write down your questions. Take them to your prenatal visits. °Where to find more information °American Pregnancy Association: americanpregnancy.org °American College of Obstetricians and Gynecologists: acog.org/en/Womens%20Health/Pregnancy °Office on Women's Health: womenshealth.gov/pregnancy °Contact a health care provider if you have: °Dizziness. °A fever. °Mild pelvic cramps, pelvic pressure, or nagging pain in the abdominal area. °Nausea, vomiting, or diarrhea that lasts for 24 hours or longer. °A bad-smelling vaginal discharge. °Pain when you urinate. °Known exposure to a contagious illness, such as chickenpox, measles, Zika virus, HIV, or hepatitis. °Get help right away if you have: °Spotting or bleeding from your vagina. °Severe abdominal cramping or pain. °Shortness of breath or chest pain. °Any kind of trauma, such as from a fall or a car crash. °New or increased pain, swelling, or redness in an arm or leg. °Summary °The first trimester of pregnancy starts on the first day of your last menstrual period until the end of week  12 (months 1 through 3). °Eating 4 or 5 small meals a day rather than 3 large meals may help to relieve nausea and vomiting. °Do not use any products that contain nicotine or tobacco, such as cigarettes, e-cigarettes, and chewing tobacco. If you need help quitting, ask your health care provider. °Keep all follow-up visits. This is important. °This information is not intended to replace advice given to you by your health care provider. Make sure you discuss any questions you have with your health care provider. °Document Revised: 07/20/2019 Document Reviewed: 05/26/2019 °Elsevier Patient Education © 2022 [employer] ° °

## 2021-04-19 NOTE — Progress Notes (Signed)
Subjective:  Theora Vankirk Oelkers is a 31 y.o. Y4M2500 at [redacted]w[redacted]d being seen today for ongoing prenatal care.  She is currently monitored for the following issues for this high-risk pregnancy and has Asthma; Amenorrhea; Anxiety; Postpartum depression; Depression during pregnancy; Fibromyalgia; Gastroesophageal reflux disease; History of HELLP syndrome, currently pregnant; History of uterine scar from previous surgery; Hypothyroidism due to Hashimoto's thyroiditis; Interstitial cystitis; Lumbar disc herniation; Lumbar spondylosis; Mild recurrent major depression ([place]); Obesity; Posttraumatic stress disorder; Primary insomnia; History of upper limb amputation, wrist, left ; Sacroiliac inflammation ([place]); Essential hypertension; MDD (major depressive disorder), recurrent episode, severe ([place]); Chronic urticaria; Perennial allergic rhinitis; Mild persistent asthma without complication; Supervision of high risk pregnancy, antepartum; History of preterm delivery, currently pregnant; Unwanted fertility; Attention deficit disorder; Chronic fatigue syndrome; and Anosmia on their problem list.  Patient reports occ nausea, meds help, denies HA.  Contractions: Not present. Vag. Bleeding: None.  Movement: Absent. Denies leaking of fluid.   The following portions of the patient's history were reviewed and updated as appropriate: allergies, current medications, past family history, past medical history, past social history, past surgical history and problem list. Problem list updated.  Objective:   Vitals:   04/18/21 1631  BP: 138/86  Pulse: 74  Weight: 222 lb 3.2 oz (100.8 kg)    Fetal Status: Fetal Heart Rate (bpm): 164   Movement: Absent     General:  Alert, oriented and cooperative. Patient is in no acute distress.  Skin: Skin is warm and dry. No rash noted.   Cardiovascular: Normal heart rate noted  Respiratory: Normal respiratory effort, no problems with respiration noted  Abdomen: Soft, gravid,  appropriate for gestational age. Pain/Pressure: Absent     Pelvic:  Cervical exam deferred        Extremities: Normal range of motion.  Edema: None  Mental Status: Normal mood and affect. Normal behavior. Normal judgment and thought content.   Urinalysis:      Assessment and Plan:  Pregnancy: [id number] at [redacted]w[redacted]d  1. Supervision of high risk pregnancy, antepartum Stable  2. Essential hypertension BP improved and stable with current management Has seen OB cards, continue to follow  3. History of preterm delivery, currently pregnant All secondary to [facility] with [place]  4. Unwanted fertility BTL papers at 28 weeks OB visit  5. 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 History of HELLP syndrome, currently pregnant See above  7. History of uterine scar from previous surgery LTCS x 2   Preterm labor symptoms and general obstetric precautions including but not limited to vaginal bleeding, contractions, leaking of fluid and fetal movement were reviewed in detail with the patient. Please refer to After Visit Summary for other counseling recommendations.  Return in about 4 weeks (around 05/16/2021) for OB visit, face to face, MD only.   Chancy Milroy, MD

## 2021-04-26 ENCOUNTER — Encounter: Payer: Self-pay | Admitting: Pharmacist

## 2021-04-26 ENCOUNTER — Ambulatory Visit (INDEPENDENT_AMBULATORY_CARE_PROVIDER_SITE_OTHER): Payer: Managed Care, Other (non HMO) | Admitting: Pharmacist

## 2021-04-26 ENCOUNTER — Other Ambulatory Visit: Payer: Self-pay

## 2021-04-26 VITALS — BP 126/88 | Wt 220.2 lb

## 2021-04-26 DIAGNOSIS — I10 Essential (primary) hypertension: Secondary | ICD-10-CM | POA: Diagnosis not present

## 2021-04-26 NOTE — Patient Instructions (Addendum)
It was good seeing you again ? ?Please continue your labetalol and hydralazine according to the schedule we set up last week ? ?If you start to experience anymore dizziness please give [place] a call or send a message ? ?I will message Dr Johney Frame to see when she needs to see you next ? ?Please call if you need to be seen sooner and I will fit you in  ? ?Karren Cobble, PharmD, BCACP, [place], CPP ?[address], [address] ?[place], [place], [address] ?Phone: [phone], Fax: [phone]  ?

## 2021-04-26 NOTE — Progress Notes (Signed)
Patient ID: Krystal [name]                 DOB: 1990-12-13                      MRN: [id number] ? ? ? ? ?HPI: ?[name] is a 31 y.o. pregnant female referred by Dr. Johney Frame for HTN consult from cardio obstetrics clinic.  Patient is 13 weeks 6 days pregnant, [id number].  Has history of resistant hypertension, preeclampsia, and HELLP.  Has history of treatment resistant depression and anxiety. ? ?At initial visit with Dr Johney Frame, BP was 117/110 and labetalol was increased to 300mg  TID. Amlodipine was prescrbed in case she could not tolerate dose increase. Has not needed to take amlodipine. ? ?At last visit with PharmD, patient reported frequent nausea and vomiting, often after she took her antihypertensives.  Therefore she was not receiving much benefit from her medications.  A schedule was created to help maximize the amount of time she could have antihypertensive exposure in case of vomiting. ? ?Patient presents today for follow up.  Blood pressure has decreased since following new schedule however she forgot her log and cuff at home.  Reports systolic BP typically around 130-135. Yesterday BP was 120/70.   ? ?Nausea/vomiting has been decreasing in frequency but still present.  She has been having increasing worry/stress/panic episodes though due to her family being in [place] and possibly not being safe. Father is in poor health and family is considering leaving the country due to political turmoil after a recent election.   ? ?Reports occasional dizziness which she associates with her BP being lower than usual. ? ?Current HTN meds:  ?Labetalol 300mg  TID ?Hydralazine 100mg  TID ? ? ?Wt Readings from Last 3 Encounters:  ?04/18/21 222 lb 3.2 oz (100.8 kg)  ?04/17/21 223 lb 6.4 oz (101.3 kg)  ?04/12/21 219 lb (99.3 kg)  ? ?BP Readings from Last 3 Encounters:  ?04/18/21 138/86  ?04/17/21 135/74  ?04/12/21 (!) 170/110  ? ?Pulse Readings from Last 3 Encounters:  ?04/18/21 74  ?04/17/21 88  ?04/12/21 66   ? ? ?Renal function: ?CrCl cannot be calculated (Patient's most recent lab result is older than the maximum 21 days allowed.). ? ?Past Medical History:  ?Diagnosis Date  ? Acute acalculous cholecystitis s/p lap cholecystectomy 05/11/2020 05/11/2020  ? Angio-edema 01/15/2021  ? Anxiety   ? Asthma   ? 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 IC (interstitial cystitis)   ? Kidney stones   ? Migraines   ? Severe uncontrolled hypertension 10/08/2017  ? UTI (urinary tract infection)   ? ? ?Current Outpatient Medications on File Prior to Visit  ?Medication Sig Dispense Refill  ? acetaminophen (TYLENOL) 500 MG tablet Take 2 tablets (1,000 mg total) by mouth every 6 (six) hours as needed. 30 tablet 0  ? albuterol (PROVENTIL HFA;VENTOLIN HFA) 108 (90 Base) MCG/ACT inhaler Inhale 2 puffs into the lungs every 6 (six) hours as needed for wheezing or shortness of breath.    ? aspirin EC 81 MG tablet Take 1 tablet (81 mg total) by mouth daily. Take after 12 weeks for prevention of preeclampsia later in pregnancy 300 tablet 2  ? budesonide-formoterol (SYMBICORT) 80-4.5 MCG/ACT inhaler Inhale 2 puffs into the lungs in the morning and at bedtime. Take for 1-2 weeks at a time during asthma flare. Rinse mouth after each use. 1 each 3  ? cetirizine (ZYRTEC ALLERGY) 10 MG tablet  Take 1 tablet (10 mg total) by mouth 2 (two) times daily. 60 tablet 5  ? cholecalciferol (VITAMIN D3) 25 MCG (1000 UNIT) tablet Take 1,000 Units by mouth daily.    ? EPINEPHrine 0.3 mg/0.3 mL IJ SOAJ injection Inject 0.3 mg into the muscle as needed for anaphylaxis. 1 each 2  ? famotidine (PEPCID) 20 MG tablet Take 1 tablet (20 mg total) by mouth 2 (two) times daily. 60 tablet 5  ? hydrALAZINE (APRESOLINE) 100 MG tablet Take 1 tablet (100 mg total) by mouth 3 (three) times daily. 90 tablet 1  ? labetalol (NORMODYNE) 300 MG tablet Take 1 tablet (300 mg total) by mouth 3 (three) times daily. 270 tablet 1  ? levothyroxine  (SYNTHROID) 112 MCG tablet Take 1 tablet (112 mcg total) by mouth daily before breakfast. 30 tablet 5  ? ondansetron (ZOFRAN-ODT) 4 MG disintegrating tablet Take 1 tablet (4 mg total) by mouth every 8 (eight) hours as needed for nausea or vomiting. 30 tablet 2  ? Prenatal Multivit-Min-Fe-FA (PRE-NATAL PO) Take by mouth.    ? promethazine (PHENERGAN) 25 MG tablet Take 1 tablet (25 mg total) by mouth every 6 (six) hours as needed for nausea or vomiting. 30 tablet 2  ? scopolamine (TRANSDERM-SCOP) 1 MG/3DAYS Place 1 patch (1.5 mg total) onto the skin every 3 (three) days. 10 patch 12  ? venlafaxine XR (EFFEXOR-XR) 150 MG 24 hr capsule Take 150 mg by mouth daily.    ? ?No current facility-administered medications on file prior to visit.  ? ? ?Allergies  ?Allergen Reactions  ? Red Dye Other (See Comments)  ?  ALL CAUSE PROBLEMS ?#40 IS WORST-MIGRAINES   ? Compazine [Prochlorperazine Maleate] Other (See Comments)  ?  TWITCHING, CANT STAY STILL. Pt states she can tolerate promethazine  ? Other Other (See Comments)  ?  FANSIDAR FOR MALARIA-ASTHMA  ? Vicodin [Hydrocodone-Acetaminophen] Itching, Nausea And Vomiting and Other (See Comments)  ?  Pt states she can tolerate acetaminophen  ? Amitriptyline Hcl Other (See Comments)  ?  Angry moods  ? Duloxetine Hcl Other (See Comments)  ?  Passing out, trimble, irritable  ? Reglan [Metoclopramide] Other (See Comments)  ?  restless  ? Nifedipine Nausea And Vomiting and Other (See Comments)  ?  Discussed with patient and maternal fetal medicine, patient has intolerance but not frank allergy.  ? ? ? ?Assessment/Plan: ? ?1. Hypertension -  Patient BP today (134/82 manual) and 126/88 (using machine) which are much improved. Patient reports occasional dizziness. Asked patient to monitor and let us know if this persists and we may need to reduce antihypertensive dosages. However it does not sound like patient is actually hypotensive.   ? ?Will continue current medication regimen following  last weeks schedule and send message to Dr Johney Frame to see when follow up is needed.  Will recheck here in clinic in 2-3 weeks ? ?Continue: ?Labetalol 300mg  TID ?Hydralazine 100mg  TID ?Recheck in 2-3 weeks ? ?Karren Cobble, PharmD, BCACP, [place], CPP ?[address], [address] ?[place], [place], [address] ?Phone: [phone], Fax: [phone]  ?

## 2021-04-29 ENCOUNTER — Telehealth: Payer: Self-pay | Admitting: *Deleted

## 2021-04-29 NOTE — Telephone Encounter (Signed)
Left the pt a message to call the office back or request to speak with our operator/scheduling team, to get her next follow-up appt with Dr. Johney Frame scheduled at [facility], for 3/24.  ?

## 2021-04-29 NOTE — Telephone Encounter (Signed)
04/29/21 9:12am LVM to schedule HTN f/u with Dr. Johney Frame at [facility], send to [facility] if pt calls back - LCN ?

## 2021-04-29 NOTE — Telephone Encounter (Signed)
-----   Message from Freada Bergeron, MD sent at 04/26/2021  8:28 PM EST ----- ?Regarding: RE: follow uo ?Krystal [name], ? ?Thanks so much for checking in on her. [name], can we get her into clinic during our next time there? If not, any time in the next 2-4 weeks is perfect. ? ?Thank you both!! ?----- Message ----- ?From: Rollen Sox, [facility] ?Sent: 04/26/2021  12:31 PM EST ?To: Nuala Alpha, LPN, Freada Bergeron, MD ?Subject: follow uo                                     ? ?Saw [name] again today.  Bp now much improved and I will see her again in a few weeks now that we are out of the danger zone.  When did you need to see her again? ? ? ?

## 2021-04-30 NOTE — Telephone Encounter (Signed)
Attempted to call the pt again to arrange follow-up with Dr. Johney Frame at [facility], and she did not answer and mailbox is full at this time.  ?

## 2021-04-30 NOTE — Telephone Encounter (Signed)
04/30/21 1:04 AM called to schedule HTN f/u with Dr. Johney Frame at [facility] send to [place] if pt calls back, mailbox full...ket  ? ?

## 2021-05-01 NOTE — Telephone Encounter (Signed)
Was able to make contact with the pt.  ?She will see PharmD on 3/24 as scheduled and will see Dr. Johney Frame in 4 weeks, on 05/31/21 at 4:20 pm at [facility]. ?Pt agreed to both appt dates and times.  ?

## 2021-05-08 ENCOUNTER — Inpatient Hospital Stay (HOSPITAL_COMMUNITY)
Admission: AD | Admit: 2021-05-08 | Discharge: 2021-05-08 | Disposition: A | Discharge status: Home / Self Care | Payer: Commercial Managed Care - HMO | Attending: Obstetrics and Gynecology | Admitting: Obstetrics and Gynecology

## 2021-05-08 ENCOUNTER — Encounter (HOSPITAL_COMMUNITY): Payer: Self-pay | Admitting: Obstetrics and Gynecology

## 2021-05-08 ENCOUNTER — Other Ambulatory Visit: Payer: Self-pay

## 2021-05-08 DIAGNOSIS — R109 Unspecified abdominal pain: Secondary | ICD-10-CM | POA: Insufficient documentation

## 2021-05-08 DIAGNOSIS — O210 Mild hyperemesis gravidarum: Secondary | ICD-10-CM

## 2021-05-08 DIAGNOSIS — O211 Hyperemesis gravidarum with metabolic disturbance: Secondary | ICD-10-CM | POA: Insufficient documentation

## 2021-05-08 DIAGNOSIS — O26892 Other specified pregnancy related conditions, second trimester: Secondary | ICD-10-CM | POA: Insufficient documentation

## 2021-05-08 DIAGNOSIS — Z3A15 15 weeks gestation of pregnancy: Secondary | ICD-10-CM | POA: Diagnosis not present

## 2021-05-08 DIAGNOSIS — O99891 Other specified diseases and conditions complicating pregnancy: Secondary | ICD-10-CM

## 2021-05-08 DIAGNOSIS — Z79899 Other long term (current) drug therapy: Secondary | ICD-10-CM | POA: Diagnosis not present

## 2021-05-08 DIAGNOSIS — F419 Anxiety disorder, unspecified: Secondary | ICD-10-CM | POA: Insufficient documentation

## 2021-05-08 DIAGNOSIS — M549 Dorsalgia, unspecified: Secondary | ICD-10-CM | POA: Insufficient documentation

## 2021-05-08 DIAGNOSIS — O99342 Other mental disorders complicating pregnancy, second trimester: Secondary | ICD-10-CM | POA: Insufficient documentation

## 2021-05-08 DIAGNOSIS — E876 Hypokalemia: Secondary | ICD-10-CM

## 2021-05-08 DIAGNOSIS — Z7901 Long term (current) use of anticoagulants: Secondary | ICD-10-CM | POA: Diagnosis not present

## 2021-05-08 DIAGNOSIS — O219 Vomiting of pregnancy, unspecified: Secondary | ICD-10-CM

## 2021-05-08 LAB — COMPREHENSIVE METABOLIC PANEL
ALT: 16 U/L (ref 0–44)
AST: 21 U/L (ref 15–41)
Albumin: 3.9 g/dL (ref 3.5–5.0)
Alkaline Phosphatase: 42 U/L (ref 38–126)
Anion gap: 11 (ref 5–15)
BUN: 8 mg/dL (ref 6–20)
CO2: 20 mmol/L — ABNORMAL LOW (ref 22–32)
Calcium: 8.9 mg/dL (ref 8.9–10.3)
Chloride: 104 mmol/L (ref 98–111)
Creatinine, Ser: 0.59 mg/dL (ref 0.44–1.00)
GFR, Estimated: >60 mL/min (ref >60)
Glucose, Bld: 93 mg/dL (ref 70–99)
Potassium: 3.1 mmol/L — ABNORMAL LOW (ref 3.5–5.1)
Sodium: 135 mmol/L (ref 135–145)
Total Bilirubin: 0.7 mg/dL (ref 0.3–1.2)
Total Protein: 6.7 g/dL (ref 6.5–8.1)

## 2021-05-08 LAB — CBC WITH DIFFERENTIAL/PLATELET
Abs Immature Granulocytes: 0.04 10*3/uL (ref 0.00–0.07)
Basophils Absolute: 0 10*3/uL (ref 0.0–0.1)
Basophils Relative: 0 %
Eosinophils Absolute: 0 10*3/uL (ref 0.0–0.5)
Eosinophils Relative: 0 %
HCT: 36.6 % (ref 36.0–46.0)
Hemoglobin: 13.2 g/dL (ref 12.0–15.0)
Immature Granulocytes: 0 %
Lymphocytes Relative: 18 %
Lymphs Abs: 1.8 10*3/uL (ref 0.7–4.0)
MCH: 33.3 pg (ref 26.0–34.0)
MCHC: 36.1 g/dL — ABNORMAL HIGH (ref 30.0–36.0)
MCV: 92.4 fL (ref 80.0–100.0)
Monocytes Absolute: 0.7 10*3/uL (ref 0.1–1.0)
Monocytes Relative: 7 %
Neutro Abs: 7.4 10*3/uL (ref 1.7–7.7)
Neutrophils Relative %: 75 %
Platelets: 282 10*3/uL (ref 150–400)
RBC: 3.96 MIL/uL (ref 3.87–5.11)
RDW: 12.7 % (ref 11.5–15.5)
WBC: 9.9 10*3/uL (ref 4.0–10.5)
nRBC: 0 % (ref 0.0–0.2)

## 2021-05-08 LAB — URINALYSIS, ROUTINE W REFLEX MICROSCOPIC
Bilirubin Urine: NEGATIVE
Glucose, UA: NEGATIVE mg/dL
Hgb urine dipstick: NEGATIVE
Ketones, ur: 80 mg/dL — AB
Leukocytes,Ua: NEGATIVE
Nitrite: NEGATIVE
Protein, ur: NEGATIVE mg/dL
Specific Gravity, Urine: 1.013 (ref 1.005–1.030)
pH: 7 (ref 5.0–8.0)

## 2021-05-08 MED ORDER — IBUPROFEN 600 MG PO TABS
600.0000 mg | ORAL_TABLET | Freq: Once | ORAL | Status: AC
  Administered 2021-05-08: 600 mg via ORAL
  Filled 2021-05-08: qty 1

## 2021-05-08 MED ORDER — SODIUM CHLORIDE 0.9 % IV SOLN
8.0000 mg | Freq: Once | INTRAVENOUS | Status: AC
  Administered 2021-05-08: 8 mg via INTRAVENOUS
  Filled 2021-05-08: qty 4

## 2021-05-08 MED ORDER — LACTATED RINGERS IV BOLUS
1000.0000 mL | Freq: Once | INTRAVENOUS | Status: AC
  Administered 2021-05-08: 1000 mL via INTRAVENOUS

## 2021-05-08 MED ORDER — HYDROXYZINE HCL 50 MG/ML IM SOLN
25.0000 mg | Freq: Once | INTRAMUSCULAR | Status: AC
  Administered 2021-05-08: 25 mg via INTRAMUSCULAR
  Filled 2021-05-08: qty 0.5

## 2021-05-08 MED ORDER — POTASSIUM CHLORIDE CRYS ER 20 MEQ PO TBCR: 20.0000 meq | EXTENDED_RELEASE_TABLET | Freq: Every day | ORAL | 0 refills | Status: DC

## 2021-05-08 MED ORDER — FAMOTIDINE IN NACL 20-0.9 MG/50ML-% IV SOLN
20.0000 mg | Freq: Once | INTRAVENOUS | Status: AC
  Administered 2021-05-08: 20 mg via INTRAVENOUS
  Filled 2021-05-08: qty 50

## 2021-05-08 MED ORDER — HYDROXYZINE PAMOATE 25 MG PO CAPS: 25.0000 mg | ORAL_CAPSULE | Freq: Three times a day (TID) | ORAL | 1 refills | Status: DC | PRN

## 2021-05-08 MED ORDER — LABETALOL HCL 100 MG PO TABS
300.0000 mg | ORAL_TABLET | Freq: Once | ORAL | Status: AC
  Administered 2021-05-08: 300 mg via ORAL
  Filled 2021-05-08: qty 3

## 2021-05-08 MED ORDER — ONDANSETRON 8 MG PO TBDP: 8.0000 mg | ORAL_TABLET | Freq: Three times a day (TID) | ORAL | 2 refills | Status: DC | PRN

## 2021-05-08 MED ORDER — POTASSIUM CHLORIDE CRYS ER 20 MEQ PO TBCR
20.0000 meq | EXTENDED_RELEASE_TABLET | Freq: Two times a day (BID) | ORAL | Status: DC
  Administered 2021-05-08: 20 meq via ORAL
  Filled 2021-05-08: qty 1

## 2021-05-08 MED ORDER — HYDRALAZINE HCL 50 MG PO TABS
100.0000 mg | ORAL_TABLET | Freq: Once | ORAL | Status: AC
  Administered 2021-05-08: 100 mg via ORAL
  Filled 2021-05-08: qty 2

## 2021-05-08 NOTE — MAU Provider Note (Signed)
?History  ?  ? ?CSN: [id number] ? ?Arrival date and time: 05/08/21 1312 ? ? Event Date/Time  ? First Provider Initiated Contact with Patient 05/08/21 1405   ?  ? ?Chief Complaint  ?Patient presents with  ? Emotional   ? Abdominal Pain  ? Back Pain  ? ?HPI ? ? ?[name] is a 31 y.o. female [phone] @ 95w4dhere in MAU with mid-lower back pain that started 2-3 days ago. The pain is constant. She tried tylenol yesterday which did not help much. She also reports N/V throughout the pregnancy. She has Zofran and phenergan at home which she is taking daily. She has lost track about how many times she has vomited.  ?Some days she is able to keep things down, other days she is not. She is taking labetalol 300 mg TID and hydralazine 100 mg TID. Reports not being able to keep this down in the last few days. She reports feeling very emotional from the N/V. She reports this is a possibility as to why she has no energy.  ? ?Patient reports she has been very emotional. She is unsure what is bringing these feelings on. She has anxiety about her past deliveries and her current N/V. She does feel supported by spouse, however not by her family.  ? ?OB History   ? ? Gravida  ?5  ? Para  ?2  ? Term  ?0  ? Preterm  ?2  ? AB  ?2  ? Living  ?2  ?  ? ? SAB  ?2  ? IAB  ?0  ? Ectopic  ?0  ? Multiple  ?0  ? Live Births  ?2  ?   ?  ?  ? ? ?Past Medical History:  ?Diagnosis Date  ? Acute acalculous cholecystitis s/p lap cholecystectomy 05/11/2020 05/11/2020  ? Angio-edema 01/15/2021  ? Anxiety   ? Asthma   ? 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 IC (interstitial cystitis)   ? Kidney stones   ? Migraines   ? Severe uncontrolled hypertension 10/08/2017  ? UTI (urinary tract infection)   ? ? ?Past Surgical History:  ?Procedure Laterality Date  ? APPENDECTOMY    ? BIOPSY  05/10/2020  ? Procedure: BIOPSY;  Surgeon: OArta Silence MD;  [facility];  Service: Endoscopy;;  ? CESAREAN  SECTION N/A 12/06/2017  ? Procedure: CESAREAN SECTION;  Surgeon: DSloan Leiter MD;  [facility]  Service: Obstetrics;  Laterality: N/A;  ? CHOLECYSTECTOMY N/A 05/11/2020  ? Procedure: SINGLE SITE LAPAROSCOPIC CHOLECYSTECTOMY AND LIVER BIOSY;  Surgeon: GMichael Boston MD;  Location: [facility];  Service: General;  Laterality: N/A;  ? ESOPHAGOGASTRODUODENOSCOPY (EGD) WITH PROPOFOL N/A 05/10/2020  ? Procedure: ESOPHAGOGASTRODUODENOSCOPY (EGD) WITH PROPOFOL;  Surgeon: OArta Silence MD;  [facility];  Service: Endoscopy;  Laterality: N/A;  ? 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 HAND RECONSTRUCTION Right   ? INCISION AND DRAINAGE    ? NASAL SEPTUM SURGERY    ? ? ?Family History  ?Problem Relation Age of Onset  ? Hypertension Mother   ? Hypertension Father   ? ? ?Social History  ? ?Tobacco Use  ? Smoking status: Never  ? Smokeless tobacco: Never  ? Tobacco comments:  ?  She tried a cigarette and never smoked again  ?Vaping Use  ? Vaping Use: Never used  ?Substance Use Topics  ? Alcohol use: Not Currently  ?  Comment: bottle of wine a week-occ  ? Drug use: No  ? ? ?Allergies:  ?Allergies  ?Allergen Reactions  ? Red Dye Other (See Comments)  ?  ALL CAUSE PROBLEMS ?#40 IS WORST-MIGRAINES   ? Compazine [Prochlorperazine Maleate] Other (See Comments)  ?  TWITCHING, CANT STAY STILL. Pt states she can tolerate promethazine  ? Other Other (See Comments)  ?  FANSIDAR FOR MALARIA-ASTHMA  ? Vicodin [Hydrocodone-Acetaminophen] Itching, Nausea And Vomiting and Other (See Comments)  ?  Pt states she can tolerate acetaminophen  ? Amitriptyline Hcl Other (See Comments)  ?  Angry moods  ? Duloxetine Hcl Other (See Comments)  ?  Passing out, trimble, irritable  ? Reglan [Metoclopramide] Other (See Comments)  ?  restless  ? Nifedipine Nausea And Vomiting and Other (See Comments)  ?  Discussed with patient and maternal fetal medicine, patient has intolerance but not frank allergy.   ? ? ?Medications Prior to Admission  ?Medication Sig Dispense Refill Last Dose  ? acetaminophen (TYLENOL) 500 MG tablet Take 2 tablets (1,000 mg total) by mouth every 6 (six) hours as needed. 30 tablet 0 05/07/2021  ? aspirin EC 81 MG tablet Take 1 tablet (81 mg total) by mouth daily. Take after 12 weeks for prevention of preeclampsia later in pregnancy 300 tablet 2 05/07/2021  ? cetirizine (ZYRTEC ALLERGY) 10 MG tablet Take 1 tablet (10 mg total) by mouth 2 (two) times daily. 60 tablet 5 05/07/2021  ? cholecalciferol (VITAMIN D3) 25 MCG (1000 UNIT) tablet Take 1,000 Units by mouth daily.   05/07/2021  ? famotidine (PEPCID) 20 MG tablet Take 1 tablet (20 mg total) by mouth 2 (two) times daily. 60 tablet 5 05/07/2021  ? hydrALAZINE (APRESOLINE) 100 MG tablet Take 1 tablet (100 mg total) by mouth 3 (three) times daily. 90 tablet 1 05/08/2021  ? labetalol (NORMODYNE) 300 MG tablet Take 1 tablet (300 mg total) by mouth 3 (three) times daily. 270 tablet 1 05/08/2021  ? levothyroxine (SYNTHROID) 112 MCG tablet Take 1 tablet (112 mcg total) by mouth daily before breakfast. 30 tablet 5 05/08/2021  ? ondansetron (ZOFRAN-ODT) 4 MG disintegrating tablet Take 1 tablet (4 mg total) by mouth every 8 (eight) hours as needed for nausea or vomiting. 30 tablet 2 05/08/2021  ? Prenatal Multivit-Min-Fe-FA (PRE-NATAL PO) Take by mouth.   05/07/2021  ? venlafaxine XR (EFFEXOR-XR) 150 MG 24 hr capsule Take 150 mg by mouth daily.   05/07/2021  ? albuterol (PROVENTIL HFA;VENTOLIN HFA) 108 (90 Base) MCG/ACT inhaler Inhale 2 puffs into the lungs every 6 (six) hours as needed for wheezing or shortness of breath.     ? budesonide-formoterol (SYMBICORT) 80-4.5 MCG/ACT inhaler Inhale 2 puffs into the lungs in the morning and at bedtime. Take for 1-2 weeks at a time during asthma flare. Rinse mouth after each use. 1 each 3   ? EPINEPHrine 0.3 mg/0.3 mL IJ SOAJ injection Inject 0.3 mg into the muscle as needed for anaphylaxis. 1 each 2   ? promethazine  (PHENERGAN) 25 MG tablet Take 1 tablet (25 mg total) by mouth every 6 (six) hours as needed for nausea or vomiting. 30 tablet 2   ? scopolamine (TRANSDERM-SCOP) 1 MG/3DAYS Place 1 patch (1.5 mg total) onto the skin every 3 (three) days. 10 patch 12   ? ?Results for orders placed or performed during the hospital encounter of 05/08/21 (from the past 48 hour(s))  ?Comprehensive metabolic panel     Status: Abnormal  ? Collection Time:  05/08/21  2:55 PM  ?Result Value Ref Range  ? Sodium 135 135 - 145 mmol/L  ? Potassium 3.1 (L) 3.5 - 5.1 mmol/L  ? Chloride 104 98 - 111 mmol/L  ? CO2 20 (L) 22 - 32 mmol/L  ? Glucose, Bld 93 70 - 99 mg/dL  ?  Comment: Glucose reference range applies only to samples taken after fasting for at least 8 hours.  ? BUN 8 6 - 20 mg/dL  ? Creatinine, Ser 0.59 0.44 - 1.00 mg/dL  ? Calcium 8.9 8.9 - 10.3 mg/dL  ? Total Protein 6.7 6.5 - 8.1 g/dL  ? Albumin 3.9 3.5 - 5.0 g/dL  ? AST 21 15 - 41 U/L  ? ALT 16 0 - 44 U/L  ? Alkaline Phosphatase 42 38 - 126 U/L  ? Total Bilirubin 0.7 0.3 - 1.2 mg/dL  ? GFR, Estimated >60 >60 mL/min  ?  Comment: (NOTE) ?Calculated using the CKD-EPI Creatinine Equation (2021) ?  ? Anion gap 11 5 - 15  ?  Comment: Performed at [facility], [place] [address] [place], [place] [address]  ?CBC with Differential/Platelet     Status: Abnormal  ? Collection Time: 05/08/21  2:55 PM  ?Result Value Ref Range  ? WBC 9.9 4.0 - 10.5 K/uL  ? RBC 3.96 3.87 - 5.11 MIL/uL  ? Hemoglobin 13.2 12.0 - 15.0 g/dL  ? HCT 36.6 36.0 - 46.0 %  ? MCV 92.4 80.0 - 100.0 fL  ? MCH 33.3 26.0 - 34.0 pg  ? MCHC 36.1 (H) 30.0 - 36.0 g/dL  ? RDW 12.7 11.5 - 15.5 %  ? Platelets 282 150 - 400 K/uL  ? nRBC 0.0 0.0 - 0.2 %  ? Neutrophils Relative % 75 %  ? Neutro Abs 7.4 1.7 - 7.7 K/uL  ? Lymphocytes Relative 18 %  ? Lymphs Abs 1.8 0.7 - 4.0 K/uL  ? Monocytes Relative 7 %  ? Monocytes Absolute 0.7 0.1 - 1.0 K/uL  ? Eosinophils Relative 0 %  ? Eosinophils Absolute 0.0 0.0 - 0.5 K/uL  ? Basophils Relative 0 %   ? Basophils Absolute 0.0 0.0 - 0.1 K/uL  ? Immature Granulocytes 0 %  ? Abs Immature Granulocytes 0.04 0.00 - 0.07 K/uL  ?  Comment: Performed at [facility], [place] [address], [place], [place] [address]  ?Enid Derry

## 2021-05-08 NOTE — MAU Note (Signed)
.  Krystal [name] is a 31 y.o. at 9w4dhere in MAU reporting: lower abdominal cramping & back pain.  Reports pain began approx 2 hours ago.  Denies taking any meds to treat pain.  Also reports has been emotional all morning, "can't stop crying".  Denies any issues or family problems.  Endorses Hx anxiety & depression, currently taking meds to treat. ? ?Onset of complaint: today, 2 hours ago ?Pain score: 5/10 abdomen 7 & 7/10 lower back ?[id number]/115 O2 sats-99% ?   ?FHT: 159 bpm ?Lab orders placed from triage:   None ? ?

## 2021-05-10 ENCOUNTER — Encounter: Payer: Self-pay | Admitting: *Deleted

## 2021-05-15 ENCOUNTER — Ambulatory Visit: Payer: Managed Care, Other (non HMO)

## 2021-05-16 ENCOUNTER — Encounter: Payer: Managed Care, Other (non HMO) | Admitting: Family Medicine

## 2021-05-17 ENCOUNTER — Other Ambulatory Visit: Payer: Self-pay

## 2021-05-17 ENCOUNTER — Ambulatory Visit (INDEPENDENT_AMBULATORY_CARE_PROVIDER_SITE_OTHER): Payer: Managed Care, Other (non HMO) | Admitting: Pharmacist Clinician (PhC)/ Clinical Pharmacy Specialist

## 2021-05-17 ENCOUNTER — Encounter: Payer: Self-pay | Admitting: Pharmacist Clinician (PhC)/ Clinical Pharmacy Specialist

## 2021-05-17 DIAGNOSIS — I10 Essential (primary) hypertension: Secondary | ICD-10-CM

## 2021-05-17 MED ORDER — AMLODIPINE BESYLATE 5 MG PO TABS: 5.0000 mg | ORAL_TABLET | Freq: Every day | ORAL | 3 refills | Status: DC

## 2021-05-17 NOTE — Progress Notes (Signed)
? ? ? ? ? ?05/17/2021 ?Krystal [name] ?December 11, 1990 ?[id number] ? ? ?Krystal [name] is a 31 y.o. pregnant female referred by Dr. Johney Frame for HTN consult from cardio obstetrics clinic.  Patient is 16 weeks 6 days pregnant, [id number].  Has history of resistant hypertension, preeclampsia, and HELLP.  Has history of treatment resistant depression and anxiety. She has had trouble throughout pregnancy due to ongoing nausea/vomiting and dizziness.  When she saw Dr. Johney Frame last month her diastolic pressure was elevated, with pressure at 170/110.  Labetalol was increased to 300 mg tid, and patient was also given prescription for amlodipine 5 mg should she not tolerate the higher labetalol dose.  She has seen Karren Cobble PharmD twice since then and has had good BP readings, although having more dizziness and ongoing bouts of N/V.  Was at ED on March 15 for hyperemesis and pain.    ?Today she returns for follow up.  Only has a few BP readings with her, as she had a bout of vomiting last weekend and ended up vomiting into her handbag, as she was at a wedding and unable to get away from her seat fast enough.   ? ?Today she returns for follow up.  She has notice her home BP readings increasing over the past couple of weeks.  She only has readings from this week, as last weekend she ended up using her handbag when she developed vomiting at a wedding and could not get out of the pew fast enough.  Her book of BP readings had been in the bag.   She notes that the stresses associated with her family being in [place] are still present and she is working to put these behind her.  Her father is in poor health and there has been some political instability that makes her nervous.   ? ?Blood Pressure Goal:  <160/110 ? ?Current Medications: labetalol 300 mg tid, hydralazine 100 mg tid ? ?Diet: having difficulty with solid foods, now drinking plant based protein shakes at least twice daily; drinks sugar free flavored water - about 3  16-20 oz bottles per day ? ?Home BP readings:  ? Home readings ? AM - 140/82   142/85  150/90   138/95  135/80   - Average 141/86 ? PM - 148/80   134/78  140/83  140/84  155/88    - Average 143/83The ? ?Intolerances: nifedipine - nausea/vomiting ? ?Labs:  05/08/21:  Na 135, K 3.1, Glu 93, BUN 8, SCr 0.59, GFR > 60 ? ? ?Wt Readings from Last 3 Encounters:  ?05/08/21 218 lb 1.6 oz (98.9 kg)  ?04/26/21 220 lb 3.2 oz (99.9 kg)  ?04/18/21 222 lb 3.2 oz (100.8 kg)  ? ?BP Readings from Last 3 Encounters:  ?05/17/21 (!) 148/84  ?05/08/21 130/67  ?04/26/21 126/88  ? ?Pulse Readings from Last 3 Encounters:  ?05/17/21 76  ?05/08/21 76  ?04/18/21 74  ? ? ?Current Outpatient Medications  ?Medication Sig Dispense Refill  ? amLODipine (NORVASC) 5 MG tablet Take 1 tablet (5 mg total) by mouth daily. 90 tablet 3  ? acetaminophen (TYLENOL) 500 MG tablet Take 2 tablets (1,000 mg total) by mouth every 6 (six) hours as needed. 30 tablet 0  ? albuterol (PROVENTIL HFA;VENTOLIN HFA) 108 (90 Base) MCG/ACT inhaler Inhale 2 puffs into the lungs every 6 (six) hours as needed for wheezing or shortness of breath.    ? aspirin EC 81 MG tablet Take 1 tablet (81 mg total) by mouth  daily. Take after 12 weeks for prevention of preeclampsia later in pregnancy 300 tablet 2  ? budesonide-formoterol (SYMBICORT) 80-4.5 MCG/ACT inhaler Inhale 2 puffs into the lungs in the morning and at bedtime. Take for 1-2 weeks at a time during asthma flare. Rinse mouth after each use. 1 each 3  ? cetirizine (ZYRTEC ALLERGY) 10 MG tablet Take 1 tablet (10 mg total) by mouth 2 (two) times daily. (Patient taking differently: Take by mouth 2 (two) times daily.) 60 tablet 5  ? cholecalciferol (VITAMIN D3) 25 MCG (1000 UNIT) tablet Take 1,000 Units by mouth daily.    ? EPINEPHrine 0.3 mg/0.3 mL IJ SOAJ injection Inject 0.3 mg into the muscle as needed for anaphylaxis. 1 each 2  ? famotidine (PEPCID) 20 MG tablet Take 1 tablet (20 mg total) by mouth 2 (two) times daily. 60  tablet 5  ? hydrALAZINE (APRESOLINE) 100 MG tablet Take 1 tablet (100 mg total) by mouth 3 (three) times daily. 90 tablet 1  ? hydrOXYzine (VISTARIL) 25 MG capsule Take 1-2 capsules (25-50 mg total) by mouth 3 (three) times daily as needed for itching. 60 capsule 1  ? labetalol (NORMODYNE) 300 MG tablet Take 1 tablet (300 mg total) by mouth 3 (three) times daily. 270 tablet 1  ? levothyroxine (SYNTHROID) 112 MCG tablet Take 1 tablet (112 mcg total) by mouth daily before breakfast. 30 tablet 5  ? ondansetron (ZOFRAN-ODT) 8 MG disintegrating tablet Take 1 tablet (8 mg total) by mouth every 8 (eight) hours as needed for nausea or vomiting. 20 tablet 2  ? potassium chloride SA (KLOR-CON M) 20 MEQ tablet Take 1 tablet (20 mEq total) by mouth daily for 7 days. 7 tablet 0  ? Prenatal Multivit-Min-Fe-FA (PRE-NATAL PO) Take by mouth.    ? promethazine (PHENERGAN) 25 MG tablet Take 1 tablet (25 mg total) by mouth every 6 (six) hours as needed for nausea or vomiting. 30 tablet 2  ? scopolamine (TRANSDERM-SCOP) 1 MG/3DAYS Place 1 patch (1.5 mg total) onto the skin every 3 (three) days. 10 patch 12  ? venlafaxine XR (EFFEXOR-XR) 150 MG 24 hr capsule Take 150 mg by mouth daily.    ? ?No current facility-administered medications for this visit.  ? ? ?Allergies  ?Allergen Reactions  ? Red Dye Other (See Comments)  ?  ALL CAUSE PROBLEMS ?#40 IS WORST-MIGRAINES   ? Compazine [Prochlorperazine Maleate] Other (See Comments)  ?  TWITCHING, CANT STAY STILL. Pt states she can tolerate promethazine  ? Other Other (See Comments)  ?  FANSIDAR FOR MALARIA-ASTHMA  ? Vicodin [Hydrocodone-Acetaminophen] Itching, Nausea And Vomiting and Other (See Comments)  ?  Pt states she can tolerate acetaminophen  ? Amitriptyline Hcl Other (See Comments)  ?  Angry moods  ? Duloxetine Hcl Other (See Comments)  ?  Passing out, trimble, irritable  ? Reglan [Metoclopramide] Other (See Comments)  ?  restless  ? Nifedipine Nausea And Vomiting and Other (See  Comments)  ?  Discussed with patient and maternal fetal medicine, patient has intolerance but not frank allergy.  ? ? ?Past Medical History:  ?Diagnosis Date  ? Acute acalculous cholecystitis s/p lap cholecystectomy 05/11/2020 05/11/2020  ? Angio-edema 01/15/2021  ? Anxiety   ? Asthma   ? 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 IC (interstitial cystitis)   ? Kidney stones   ? Migraines   ? Severe uncontrolled hypertension 10/08/2017  ? UTI (urinary tract infection)   ? ? ?Blood pressure Marland Kitchen)  148/84, pulse 76, last menstrual period 01/19/2021, not currently breastfeeding. ? ?Essential hypertension ?Patient with hypertension and now 16 weeks 6 days pregnant.  She feels dizziness is worsening and would like to know if she can ease back on the labetalol and add in amlodipine.  The few readings we have are still within  Acceptable range for pregnancy, but will do a slow taper down to 200 mg tid on the labetalol to avoid any potential spike before the amlodipine is fully in her system.  She will cut back to 200 mg in the morning, 300 mid-day and night x 4 days, then 200 mg am and mid-day, 300 mg at night for 4 more days then drop to 200 mg tid.  She should continue with hydralazine.  She is scheduled to see Dr. Johney Frame in 2-3 weeks and we can follow as needed after that.   ? ? ?Tommy Medal PharmD CPP [facility] ?[address] ?[address] ?[place], [place] [address] ?[phone] ?

## 2021-05-17 NOTE — Assessment & Plan Note (Signed)
Patient with hypertension and now 16 weeks 6 days pregnant.  She feels dizziness is worsening and would like to know if she can ease back on the labetalol and add in amlodipine.  The few readings we have are still within  Acceptable range for pregnancy, but will do a slow taper down to 200 mg tid on the labetalol to avoid any potential spike before the amlodipine is fully in her system.  She will cut back to 200 mg in the morning, 300 mid-day and night x 4 days, then 200 mg am and mid-day, 300 mg at night for 4 more days then drop to 200 mg tid.  She should continue with hydralazine.  She is scheduled to see Dr. Johney Frame in 2-3 weeks and we can follow as needed after that.   ?

## 2021-05-17 NOTE — Patient Instructions (Signed)
Return for a a follow up appointment with Dr. Johney Frame on April 7 ? ?Check your blood pressure at home daily and keep record of the readings. ? ?Take your BP meds as follows: ? Start amlodipine 5 mg once daily (morning or evening) ? Decrease labetalol by 100 mg every 4 days until at 200 mg tid ?  ? ?Bring all of your meds, your BP cuff and your record of home blood pressures to your next appointment.  Exercise as you?re able, try to walk approximately 30 minutes per day.  Keep salt intake to a minimum, especially watch canned and prepared boxed foods.  Eat more fresh fruits and vegetables and fewer canned items.  Avoid eating in fast food restaurants.  ? ? HOW TO TAKE YOUR BLOOD PRESSURE: ?Rest 5 minutes before taking your blood pressure. ? Don?t smoke or drink caffeinated beverages for at least 30 minutes before. ?Take your blood pressure before (not after) you eat. ?Sit comfortably with your back supported and both feet on the floor (don?t cross your legs). ?Elevate your arm to heart level on a table or a desk. ?Use the proper sized cuff. It should fit smoothly and snugly around your bare upper arm. There should be enough room to slip a fingertip under the cuff. The bottom edge of the cuff should be 1 inch above the crease of the elbow. ?Ideally, take 3 measurements at one sitting and record the average. ? ? ?

## 2021-05-23 ENCOUNTER — Ambulatory Visit (INDEPENDENT_AMBULATORY_CARE_PROVIDER_SITE_OTHER): Payer: Managed Care, Other (non HMO) | Admitting: Family Medicine

## 2021-05-23 VITALS — BP 156/100 | HR 77 | Wt 223.0 lb

## 2021-05-23 DIAGNOSIS — Z9889 Other specified postprocedural states: Secondary | ICD-10-CM | POA: Insufficient documentation

## 2021-05-23 DIAGNOSIS — E063 Autoimmune thyroiditis: Secondary | ICD-10-CM

## 2021-05-23 DIAGNOSIS — O09299 Supervision of pregnancy with other poor reproductive or obstetric history, unspecified trimester: Secondary | ICD-10-CM

## 2021-05-23 DIAGNOSIS — O10919 Unspecified pre-existing hypertension complicating pregnancy, unspecified trimester: Secondary | ICD-10-CM

## 2021-05-23 DIAGNOSIS — E038 Other specified hypothyroidism: Secondary | ICD-10-CM

## 2021-05-23 DIAGNOSIS — Z98891 History of uterine scar from previous surgery: Secondary | ICD-10-CM

## 2021-05-23 DIAGNOSIS — O219 Vomiting of pregnancy, unspecified: Secondary | ICD-10-CM

## 2021-05-23 DIAGNOSIS — Z3009 Encounter for other general counseling and advice on contraception: Secondary | ICD-10-CM

## 2021-05-23 DIAGNOSIS — O0990 Supervision of high risk pregnancy, unspecified, unspecified trimester: Secondary | ICD-10-CM

## 2021-05-23 DIAGNOSIS — F332 Major depressive disorder, recurrent severe without psychotic features: Secondary | ICD-10-CM

## 2021-05-23 DIAGNOSIS — F419 Anxiety disorder, unspecified: Secondary | ICD-10-CM

## 2021-05-23 MED ORDER — SCOPOLAMINE 1 MG/3DAYS TD PT72: 1.0000 | MEDICATED_PATCH | TRANSDERMAL | 12 refills | Status: DC

## 2021-05-23 NOTE — Progress Notes (Signed)
? ?PRENATAL VISIT NOTE ? ?Subjective:  ?Jayne Peckenpaugh Phimmasone is a 31 y.o. [id number] at 18w5dbeing seen today for ongoing prenatal care.  She is currently monitored for the following issues for this high-risk pregnancy and has Chronic hypertension in pregnancy; Anxiety; Fibromyalgia; Gastroesophageal reflux disease; History of HELLP syndrome, currently pregnant; History of uterine scar from previous surgery; Hypothyroidism due to Hashimoto's thyroiditis; Interstitial cystitis; Lumbar disc herniation; Lumbar spondylosis; Obesity; Posttraumatic stress disorder; Primary insomnia; History of upper limb amputation, wrist, left ; Sacroiliac inflammation ([facility]; Essential hypertension; MDD (major depressive disorder), recurrent episode, severe ([facility]; Chronic urticaria; Perennial allergic rhinitis; Mild persistent asthma without complication; Supervision of high risk pregnancy, antepartum; History of preterm delivery, currently pregnant; Unwanted fertility; Attention deficit disorder; Chronic fatigue syndrome; Anosmia; and History of excision of intestinal structure on their problem list. ? ?Patient reports fatigue. Reports being unable to sleep. Has depression and anxiety--on Effexor and added Vistaril. Wants to see Jamie--likely need Psych for further monitoring. Contractions: Not present. Vag. Bleeding: None.  Movement: Absent. Denies leaking of fluid.  ? ?The following portions of the patient's history were reviewed and updated as appropriate: allergies, current medications, past family history, past medical history, past social history, past surgical history and problem list.  ? ?Objective:  ? ?Vitals:  ? 05/23/21 1037 05/23/21 1121  ?BP: (!) 172/94 (!) 156/100  ?Pulse: 76 77  ?Weight: 223 lb (101.2 kg)   ? ? ?Fetal Status: Fetal Heart Rate (bpm): 159   Movement: Absent    ? ?General:  Alert, oriented and cooperative. Patient is in no acute distress.  ?Skin: Skin is warm and dry. No rash noted.   ?Cardiovascular: Normal  heart rate noted  ?Respiratory: Normal respiratory effort, no problems with respiration noted  ?Abdomen: Soft, gravid, appropriate for gestational age.  Pain/Pressure: Absent     ?Pelvic: Cervical exam deferred        ?Extremities: Normal range of motion.  Edema: Trace  ?Mental Status: Normal mood and affect. Normal behavior. Normal judgment and thought content.  ? ?Assessment and Plan:  ?Pregnancy: [id number] 132w5d1. Supervision of high risk pregnancy, antepartum ?AFP today ?- AFP, Serum, Open Spina Bifida ? ? ?2. History of HELLP syndrome, currently pregnant ?On ASA ? ?3. History of uterine scar from previous surgery ?X 2, for Repeat ? ?4. Severe episode of recurrent major depressive disorder, without psychotic features ([facility]?On Effexor and Vistril ?See JaRoselyn ReefLikely needs some med adjustment, up or additive. ? ?5. Unwanted fertility ?Desires BTL ? ?6. Nausea and vomiting in pregnancy ?- scopolamine (TRANSDERM-SCOP) 1 MG/3DAYS; Place 1 patch (1.5 mg total) onto the skin every 3 (three) days.  Dispense: 10 patch; Refill: 12 ? ?7. Chronic hypertension in pregnancy ?On Labetalol and Amlodipine and hydralazine. Apparently her Labetalol makes her too dizzy. See Cardio OB and they are titrating her meds. ?Our goal is for BP < 140/90 ? ?8. Hypothyroidism due to Hashimoto's thyroiditis ?Has endocrinology f/u and needs labs--will draw today for her. ?- TSH ?- T4, free ?- Thyroglobulin antibody ?- Thyroid peroxidase antibody ? ?9. Anxiety ?- [facility]ulatory referral to [facility] ?Preterm labor symptoms and general obstetric precautions including but not limited to vaginal bleeding, contractions, leaking of fluid and fetal movement were reviewed in detail with the patient. ?Please refer to After Visit Summary for other counseling recommendations.  ? ?[facility] (around [facility]06[facility]2021[facility] ?Future Appointments  ?Date Time Provider [place]?05/31/2021  4:20 PM  Freada Bergeron, MD  [facility] None  ?06/03/2021  9:15 AM [facility]IORAL HE[facility] CLINICIAN [facility] [place]  ?06/03/2021  2:30 PM [facility] NURSE [facility]  ?06/03/2021  2:45 PM [facility] US4 [facility]  ?06/13/2021 11:15 AM Woodroe Mode, MD [facility]  ?06/17/2021 11:00 AM Philemon Kingdom, MD [facility] None  ?06/26/2021 11:15 AM Garnet Sierras, DO [facility] None  ?07/08/2021  1:15 PM Woodroe Mode, MD [facility]  ? ? ?Donnamae Jude, MD ? ?

## 2021-05-24 ENCOUNTER — Other Ambulatory Visit: Payer: Self-pay | Admitting: Internal Medicine

## 2021-05-24 DIAGNOSIS — E039 Hypothyroidism, unspecified: Secondary | ICD-10-CM

## 2021-05-24 LAB — THYROGLOBULIN ANTIBODY: Thyroglobulin Antibody: 11.8 IU/mL — ABNORMAL HIGH (ref 0.0–0.9)

## 2021-05-24 LAB — THYROID PEROXIDASE ANTIBODY: Thyroperoxidase Ab SerPl-aCnc: 90 IU/mL — ABNORMAL HIGH (ref 0–34)

## 2021-05-24 LAB — TSH: TSH: 3.79 u[IU]/mL (ref 0.450–4.500)

## 2021-05-24 LAB — T4, FREE: Free T4: 1.22 ng/dL (ref 0.82–1.77)

## 2021-05-24 MED ORDER — LEVOTHYROXINE SODIUM 125 MCG PO TABS
125.0000 ug | ORAL_TABLET | Freq: Every day | ORAL | 3 refills | Status: DC
Start: 2021-05-24 — End: 2022-05-05

## 2021-05-28 LAB — AFP, SERUM, OPEN SPINA BIFIDA
AFP MoM: 1.24
AFP Value: 42 ng/mL
Gest. Age on Collection Date: 17.7 weeks
Maternal Age At EDD: 33.1 yr
OSBR Risk 1 IN: 5748
Test Results:: NEGATIVE
Weight: 223 [lb_av]

## 2021-05-28 NOTE — Progress Notes (Addendum)
? ?Virtual Visit via Video Note  ? ?This visit type was conducted due to national recommendations for restrictions regarding the COVID-19 Pandemic (e.g. social distancing) in an effort to limit this patient's exposure and mitigate transmission in our community.  Due to her co-morbid illnesses, this patient is at least at moderate risk for complications without adequate follow up.  This format is felt to be most appropriate for this patient at this time.  All issues noted in this document were discussed and addressed.  A limited physical exam was performed with this format.  Please refer to the patient's chart for her consent to telehealth for [facility]. ? ?   ? ? ?Date:  06/11/2021  ? ?ID:  Krystal [name], DOB [birth year], MRN [id number] ?The patient was identified using 2 identifiers. ? ?Patient Location: Home ?Provider Location: Office/Clinic ? ? ?[facility] ? ?Follow-up: ? ?Date:  05/31/2021  ? ?ID:  Krystal [name], DOB 19-Mar-1990, MRN [id number] ? ?PCP:  Audley Hose, MD ?  ?[facility] Providers ?Cardiologist:  None  ?Electrophysiologist:  None      ? ?Referring MD: Audley Hose, MD  ? ?Chief Complaint: uncontrolled HTN  ? ?History of Present Illness:   ? ?Krystal [name] is a 31 y.o. female [[id number]] who presents to clinic for follow-up of HTN. ? 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 We have been adjusting her antihypertensive agents over the past several weeks and she is currently on labetalol '200mg'$  TID, hydralazine '100mg'$  TID, and amlodipine '5mg'$  daily. ? ?Today, the patient is seen virtually per her preference. She states that her blood pressures are mainly running 130-140s at home if she is able to take her medications. She is continuing to  struggle with frequent nausea and vomiting. She thinks labetalol and her prenatal vitamin may be contributing. She denies any chest pain but has been feeling more shortness of breath. Also notes that she is having to sleep more propped up at night due to feeling SOB with lying flat. No LE edema, chest pain, or palpitations. ? ? ?Prior CV Studies Reviewed: ?The following studies were reviewed today: ?No CV studies ? ?Past Medical History:  ?Diagnosis Date  ? Acute acalculous cholecystitis s/p lap cholecystectomy 05/11/2020 05/11/2020  ? Angio-edema 01/15/2021  ? Anxiety   ? Asthma   ? 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 IC (interstitial cystitis)   ? Kidney stones   ? Migraines   ? Severe uncontrolled hypertension 10/08/2017  ? UTI (urinary tract infection)   ? ? ?Past Surgical History:  ?Procedure Laterality Date  ? APPENDECTOMY    ? BIOPSY  05/10/2020  ? Procedure: BIOPSY;  Surgeon: Arta Silence, MD;  Location: [facility];  Service: Endoscopy;;  ? CESAREAN SECTION N/A 12/06/2017  ? Procedure: CESAREAN SECTION;  Surgeon: Sloan Leiter, MD;  Location: [place];  Service: Obstetrics;  Laterality: N/A;  ? CHOLECYSTECTOMY N/A 05/11/2020  ? Procedure: SINGLE SITE LAPAROSCOPIC CHOLECYSTECTOMY AND LIVER BIOSY;  Surgeon: Michael Boston, MD;  Location: [facility];  Service: General;  Laterality: N/A;  ? ESOPHAGOGASTRODUODENOSCOPY (EGD) WITH PROPOFOL N/A 05/10/2020  ? Procedure: ESOPHAGOGASTRODUODENOSCOPY (EGD) WITH PROPOFOL;  Surgeon: Arta Silence, MD;  Location: [facility];  Service: Endoscopy;  Laterality: N/A;  ? 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 HAND RECONSTRUCTION Right   ? INCISION AND DRAINAGE    ? NASAL SEPTUM SURGERY    ?   ? ?OB History   ? ? Gravida  ?5  ? Para  ?2  ? Term  ?0  ? Preterm  ?2  ? AB  ?2  ? Living  ?2  ?  ? ? SAB  ?2  ? IAB  ?0  ? Ectopic  ?0  ? Multiple  ?0  ? Live Births  ?2  ?   ?  ?  ?    ? ? ?Current  Medications: ?Current Meds  ?Medication Sig  ? acetaminophen (TYLENOL) 500 MG tablet Take 2 tablets (1,000 mg total) by mouth every 6 (six) hours as needed.  ? albuterol (PROVENTIL HFA;VENTOLIN HFA) 108 (90 Base) MCG/ACT inhaler Inhale 2 puffs into the lungs every 6 (six) hours as needed for wheezing or shortness of breath.  ? amLODipine (NORVASC) 10 MG tablet Take 1 tablet (10 mg total) by mouth daily.  ? aspirin EC 81 MG tablet Take 1 tablet (81 mg total) by mouth daily. Take after 12 weeks for prevention of preeclampsia later in pregnancy  ? budesonide-formoterol (SYMBICORT) 80-4.5 MCG/ACT inhaler Inhale 2 puffs into the lungs in the morning and at bedtime. Take for 1-2 weeks at a time during asthma flare. Rinse mouth after each use.  ? cetirizine (ZYRTEC ALLERGY) 10 MG tablet Take 1 tablet (10 mg total) by mouth 2 (two) times daily.  ? cholecalciferol (VITAMIN D3) 25 MCG (1000 UNIT) tablet Take 1,000 Units by mouth daily.  ? EPINEPHrine 0.3 mg/0.3 mL IJ SOAJ injection Inject 0.3 mg into the muscle as needed for anaphylaxis.  ? famotidine (PEPCID) 20 MG tablet Take 1 tablet (20 mg total) by mouth 2 (two) times daily.  ? hydrALAZINE (APRESOLINE) 100 MG tablet Take 1 tablet (100 mg total) by mouth 3 (three) times daily.  ? hydrOXYzine (VISTARIL) 25 MG capsule Take 1-2 capsules (25-50 mg total) by mouth 3 (three) times daily as needed for itching.  ? labetalol (NORMODYNE) 300 MG tablet Take 0.5 tablets (150 mg total) by mouth 3 (three) times daily.  ? levothyroxine (SYNTHROID) 125 MCG tablet Take 1 tablet (125 mcg total) by mouth daily before breakfast.  ? ondansetron (ZOFRAN-ODT) 8 MG disintegrating tablet Take 1 tablet (8 mg total) by mouth every 8 (eight) hours as needed for nausea or vomiting.  ? Prenatal Multivit-Min-Fe-FA (PRE-NATAL PO) Take by mouth.  ? promethazine (PHENERGAN) 25 MG tablet Take 1 tablet (25 mg total) by mouth every 6 (six) hours as needed for nausea or vomiting.  ? scopolamine  (TRANSDERM-SCOP) 1 MG/3DAYS Place 1 patch (1.5 mg total) onto the skin every 3 (three) days.  ? venlafaxine XR (EFFEXOR-XR) 150 MG 24 hr capsule Take 150 mg by mouth daily.  ? [DISCONTINUED] amLODipine (NORVASC) 5 MG tablet Take 1 tablet (5 mg total) by mouth daily.  ? [DISCONTINUED] labetalol (NORMODYNE) 200 MG tablet Take 200 mg by mouth 3 (three) times daily.  ?  ? ?Allergies:   Red dye, Compazine [prochlorperazine maleate], Other, Vicodin [hydrocodone-acetaminophen], Amitriptyline hcl, Duloxetine hcl, Reglan [metoclopramide], and Nifedipine  ? ?Social History  ? ?Socioeconomic History  ? Marital status: Married  ?  Spouse name: Not on file  ? Number of children: Not on file  ? Years of education: Not on file  ? Highest education level: Not on file  ?Occupational  History  ? Not on file  ?Tobacco Use  ? Smoking status: Never  ? Smokeless tobacco: Never  ? Tobacco comments:  ?  She tried a cigarette and never smoked again  ?Vaping Use  ? Vaping Use: Never used  ?Substance and Sexual Activity  ? Alcohol use: Not Currently  ?  Comment: bottle of wine a week-occ  ? Drug use: No  ? Sexual activity: Yes  ?Other Topics Concern  ? Not on file  ?Social History Narrative  ? Not on file  ? ?Social Determinants of Health  ? ?Financial Resource Strain: Not on file  ?Food Insecurity: No Food Insecurity  ? Worried About Charity fundraiser in the Last Year: Never true  ? Ran Out of Food in the Last Year: Never true  ?Transportation Needs: No Transportation Needs  ? Lack of Transportation (Medical): No  ? Lack of Transportation (Non-Medical): No  ?Physical Activity: Not on file  ?Stress: Not on file  ?Social Connections: Not on file  ?  ? ? ?Family History  ?Problem Relation Age of Onset  ? Hypertension Mother   ? Hypertension Father   ?   ? ?ROS:   ?Please see the history of present illness.    ?All other systems reviewed and are negative. ? ? ?Labs/EKG Reviewed:   ? ?EKG:   ?EKG is not ordered today.  ? ?Recent  Labs: ?05/08/2021: ALT 16; BUN 8; Creatinine, Ser 0.59; Hemoglobin 13.2; Platelets 282; Potassium 3.1; Sodium 135 ?05/23/2021: TSH 3.790  ? ?Recent Lipid Panel ?No results found for: CHOL, TRIG, HDL, CHOLHDL, LDLCALC, LDLDIRECT ? ?Physical Exam:   ?

## 2021-05-29 ENCOUNTER — Telehealth: Payer: Self-pay | Admitting: Cardiology

## 2021-05-29 ENCOUNTER — Telehealth: Payer: Self-pay | Admitting: *Deleted

## 2021-05-29 NOTE — Telephone Encounter (Signed)
Spoke with the pt and informed her that Dr. Johney Frame is okay for her to do the mychart visit with her this Friday at 4:20 pm, as long as she has a reliable BP cuff for the visit.  ?Pt states she monitors her pressures at home and has a reliable cuff.  ? ?Pt aware that our [place] will call her around 4 pm on Friday to start the rooming process and at that time she should have her vitals taken and written down.  Advised her she can go ahead on mychart and confirm her current meds and allergies, in preparation for the visit.  ?Visit changed to mychart visit with Dr. Johney Frame on this Friday 4/7 at 4:20 pm.   ? ?Consent will be obtained in a separate encounter.  Pt states she is very familiar with how our mychart video visits work. ?Pt verbalized understanding and agrees with this plan. ?

## 2021-05-29 NOTE — Telephone Encounter (Signed)
?  Patient Consent for Virtual Visit  ? ? ?   ? ?Krystal [name] has provided verbal consent on 05/29/2021 for a virtual visit (video or telephone). ? ? ?CONSENT FOR VIRTUAL VISIT FOR:  Krystal [name]  ?By participating in this virtual visit I agree to the following: ? ?I hereby voluntarily request, consent and authorize [facility] and its employed or contracted physicians, physician assistants, nurse practitioners or other licensed health care professionals (the Practitioner), to provide me with telemedicine health care services (the ?Services") as deemed necessary by the treating Practitioner. I acknowledge and consent to receive the Services by the Practitioner via telemedicine. I understand that the telemedicine visit will involve communicating with the Practitioner through live audiovisual communication technology and the disclosure of certain medical information by electronic transmission. I acknowledge that I have been given the opportunity to request an in-person assessment or other available alternative prior to the telemedicine visit and am voluntarily participating in the telemedicine visit. ? ?I understand that I have the right to withhold or withdraw my consent to the use of telemedicine in the course of my care at any time, without affecting my right to future care or treatment, and that the Practitioner or I may terminate the telemedicine visit at any time. I understand that I have the right to inspect all information obtained and/or recorded in the course of the telemedicine visit and may receive copies of available information for a reasonable fee.  I understand that some of the potential risks of receiving the Services via telemedicine include:  ?Delay or interruption in medical evaluation due to technological equipment failure or disruption; ?Information transmitted may not be sufficient (e.g. poor resolution of images) to allow for appropriate medical decision making by the  Practitioner; and/or  ?In rare instances, security protocols could fail, causing a breach of personal health information. ? ?Furthermore, I acknowledge that it is my responsibility to provide information about my medical history, conditions and care that is complete and accurate to the best of my ability. I acknowledge that Practitioner's advice, recommendations, and/or decision may be based on factors not within their control, such as incomplete or inaccurate data provided by me or distortions of diagnostic images or specimens that may result from electronic transmissions. I understand that the practice of medicine is not an exact science and that Practitioner makes no warranties or guarantees regarding treatment outcomes. I acknowledge that a copy of this consent can be made available to me via my patient portal ([facility]), or I can request a printed copy by calling the office of [facility].   ? ?I understand that my insurance will be billed for this visit.  ? ?I have read or had this consent read to me. ?I understand the contents of this consent, which adequately explains the benefits and risks of the Services being provided via telemedicine.  ?I have been provided ample opportunity to ask questions regarding this consent and the Services and have had my questions answered to my satisfaction. ?I give my informed consent for the services to be provided through the use of telemedicine in my medical care ? ? ? ?

## 2021-05-29 NOTE — Telephone Encounter (Signed)
We need reliable blood pressures for her visit so if she is able to provide them, we can do virtually. ?

## 2021-05-29 NOTE — Telephone Encounter (Signed)
Will route this request to Dr. Johney Frame to further review and advise on.  ?Will follow-up with the pt accordingly thereafter.  ?

## 2021-05-29 NOTE — Telephone Encounter (Addendum)
Patient has appt for Friday 4/7, she wants to know if her appt can be switched from an in office visit to a virtual visit.  ?

## 2021-05-31 ENCOUNTER — Encounter: Payer: Self-pay | Admitting: *Deleted

## 2021-05-31 ENCOUNTER — Encounter: Payer: Self-pay | Admitting: Cardiology

## 2021-05-31 ENCOUNTER — Telehealth (INDEPENDENT_AMBULATORY_CARE_PROVIDER_SITE_OTHER): Payer: Managed Care, Other (non HMO) | Admitting: Cardiology

## 2021-05-31 VITALS — BP 152/89 | HR 76 | Ht 71.0 in

## 2021-05-31 DIAGNOSIS — R0601 Orthopnea: Secondary | ICD-10-CM | POA: Diagnosis not present

## 2021-05-31 DIAGNOSIS — O09899 Supervision of other high risk pregnancies, unspecified trimester: Secondary | ICD-10-CM

## 2021-05-31 DIAGNOSIS — I10 Essential (primary) hypertension: Secondary | ICD-10-CM

## 2021-05-31 DIAGNOSIS — Z79899 Other long term (current) drug therapy: Secondary | ICD-10-CM

## 2021-05-31 DIAGNOSIS — R600 Localized edema: Secondary | ICD-10-CM | POA: Diagnosis not present

## 2021-05-31 DIAGNOSIS — O09299 Supervision of pregnancy with other poor reproductive or obstetric history, unspecified trimester: Secondary | ICD-10-CM

## 2021-05-31 MED ORDER — LABETALOL HCL 300 MG PO TABS: 150.0000 mg | ORAL_TABLET | Freq: Three times a day (TID) | ORAL | 1 refills | Status: DC

## 2021-05-31 MED ORDER — AMLODIPINE BESYLATE 10 MG PO TABS: 10.0000 mg | ORAL_TABLET | Freq: Every day | ORAL | 2 refills | Status: DC

## 2021-05-31 NOTE — Patient Instructions (Signed)
Medication Instructions:  ? ?INCREASE YOUR AMLODIPINE TO 10 MG BY MOUTH DAILY ? ?DECREASE YOUR LABETALOL TO 150 MG BY MOUTH THREE TIMES DAILY ? ?*If you need a refill on your cardiac medications before your next appointment, please call your pharmacy* ? ? ?Lab Work: ? ?NEXT WEEK AT OUR [address] [place] [address] --CHECK BMET, PRO-BNP, AND MAGNESIUM LEVEL. ? ?If you have labs (blood work) drawn today and your tests are completely normal, you will receive your results only by: ?MyChart Message (if you have MyChart) OR ?A paper copy in the mail ?If you have any lab test that is abnormal or we need to change your treatment, we will call you to review the results. ? ? ?Testing/Procedures: ? ?Your physician has requested that you have an echocardiogram. Echocardiography is a painless test that uses sound waves to create images of your heart. It provides your doctor with information about the size and shape of your heart and how well your heart?s chambers and valves are working. This procedure takes approximately one hour. There are no restrictions for this procedure.  Cardiac-OB patient: to be performed by [facility] or [facility].  ? ? ? ?Follow-Up: ? ?1.) 2 WEEKS WITH [facility] PHARMACIST IN BLOOD PRESSURE CLINIC ? ?2.) 4 WEEKS WITH DR. Johney Frame EITHER AT [place]  ? ? ?--PLEASE KEEP DR. [place] POSTED ON YOUR BP READINGS - YOU CAN SEND THOSE TO HER VIA MYCHART ? ?Important Information About Sugar ? ? ? ? ? ? ?

## 2021-06-03 ENCOUNTER — Encounter: Payer: Self-pay | Admitting: *Deleted

## 2021-06-03 ENCOUNTER — Other Ambulatory Visit: Payer: Self-pay | Admitting: *Deleted

## 2021-06-03 ENCOUNTER — Ambulatory Visit: Payer: Commercial Managed Care - HMO | Admitting: *Deleted

## 2021-06-03 ENCOUNTER — Ambulatory Visit: Payer: Commercial Managed Care - HMO | Attending: Obstetrics and Gynecology

## 2021-06-03 ENCOUNTER — Ambulatory Visit (INDEPENDENT_AMBULATORY_CARE_PROVIDER_SITE_OTHER): Payer: Medicaid Other | Admitting: Clinical

## 2021-06-03 VITALS — BP 147/91 | HR 77

## 2021-06-03 DIAGNOSIS — O0990 Supervision of high risk pregnancy, unspecified, unspecified trimester: Secondary | ICD-10-CM

## 2021-06-03 DIAGNOSIS — F4310 Post-traumatic stress disorder, unspecified: Secondary | ICD-10-CM

## 2021-06-03 DIAGNOSIS — F332 Major depressive disorder, recurrent severe without psychotic features: Secondary | ICD-10-CM | POA: Diagnosis not present

## 2021-06-03 DIAGNOSIS — Z362 Encounter for other antenatal screening follow-up: Secondary | ICD-10-CM

## 2021-06-03 DIAGNOSIS — O10919 Unspecified pre-existing hypertension complicating pregnancy, unspecified trimester: Secondary | ICD-10-CM

## 2021-06-03 NOTE — BH Specialist Note (Signed)
Integrated Behavioral Health via Telemedicine Visit ? ?06/03/2021 ?Krystal [name] ?[id number] ? ?Number of [place] [facility] Clinician visits: 1- Initial Visit ? ?Session Start time: [phone] ?  ?Session End time: 7494 ? ?Total time in minutes: 67 ? ? ?Referring Provider: Darron Doom, MD ?Patient/Family location: Home ?[facility] Provider location: [facility] [employer] at [facility] ? ?All persons participating in visit: [name] and [place]  ? ?Types of Service: Individual psychotherapy and Video visit ? ?I connected with Krystal [name] and/or Krystal [name]'s  n/a  via  Telephone or Video Enabled Telemedicine Application  (Video is Caregility application) and verified that I am speaking with the correct person using two identifiers. Discussed confidentiality: Yes  ? ?I discussed the limitations of telemedicine and the availability of in person appointments.  Discussed there is a possibility of technology failure and discussed alternative modes of communication if that failure occurs. ? ?I discussed that engaging in this telemedicine visit, they consent to the provision of behavioral healthcare and the services will be billed under their insurance. ? ?Patient and/or legal guardian expressed understanding and consented to Telemedicine visit: Yes  ? ?Presenting Concerns: ?Patient and/or family reports the following symptoms/concerns: Father's deteriorating health; estrangement from mother and siblings, all in the midst of ongoing nausea/vomiting, including waking up 3-4 times nightly with nausea/vomiting  and nightmares in current pregnancy. ?Duration of problem: Ongoing; Severity of problem:  moderately severe ? ?Patient and/or Family's Strengths/Protective Factors: ?Concrete supports in place (healthy food, safe environments, etc.) and Sense of purpose ? ?Goals Addressed: ?Patient will: ? Reduce symptoms of: anxiety, depression, and stress   ? ?Progress towards Goals: ?Ongoing ? ?Interventions: ?Interventions utilized:  Supportive Reflection ?Standardized Assessments completed:  not needed today ? ?Patient and/or Family Response: Patient agrees with treatment plan. ? ? ?Assessment: ?Patient currently experiencing Major depressive disorder, severe, recurrent, without psychotic features and Post traumatic stress disorder (both as previously diagnosed) ? ?Patient may benefit from brief therapeutic interventions. ? ?Plan: ?Follow up with behavioral health clinician on : Two weeks ?Behavioral recommendations:  ?-Continue prioritizing healthy self-care via sleeping and nourishment; follow medical advice regarding ongoing nausea/vomiting ?-Consider keeping dream journal at bedside; write down what you remember upon waking.  ?-Continue plan for outdoor fishing day within next two weeks with family  ? ?Referral(s): [place] (In Clinic) ? ?I discussed the assessment and treatment plan with the patient and/or parent/guardian. They were provided an opportunity to ask questions and all were answered. They agreed with the plan and demonstrated an understanding of the instructions. ?  ?They were advised to call back or seek an in-person evaluation if the symptoms worsen or if the condition fails to improve as anticipated. ? ?Garlan Fair, LCSW ? ? ?  05/23/2021  ? 10:42 AM 04/01/2021  ? 10:52 AM 12/12/2020  ? 11:03 AM 10/09/2020  ?  5:26 PM 09/26/2020  ? 11:51 AM  ?Depression screen PHQ 2/9  ?Decreased Interest '1 3  3 2  '$ ?Down, Depressed, Hopeless 1 0  2 3  ?PHQ - 2 Score '2 3  5 5  '$ ?Altered sleeping '3 3  3 3  '$ ?Tired, decreased energy '2 2  2 2  '$ ?Change in appetite '1 1  3 3  '$ ?Feeling bad or failure about yourself  '1 1  2 2  '$ ?Trouble concentrating '1 1  3 2  '$ ?Moving slowly or fidgety/restless '2 1  1 3  '$ ?Suicidal thoughts  0 0  0 0  ?PHQ-9 Score '12 12  19 20  '$ ?Difficult doing work/chores       ?  ? Information is confidential and restricted. Go  to Review Flowsheets to unlock data.  ? ? ?  05/23/2021  ? 10:42 AM 04/01/2021  ? 10:53 AM 10/09/2020  ?  5:27 PM 09/26/2020  ? 11:52 AM  ?GAD 7 : Generalized Anxiety Score  ?Nervous, Anxious, on Edge '3 2 3 3  '$ ?Control/stop worrying '1 1 2 3  '$ ?Worry too much - different things 1 0 3 3  ?Trouble relaxing 2 0 3 3  ?Restless '1 1 1 2  '$ ?Easily annoyed or irritable '3 2 3 3  '$ ?Afraid - awful might happen 2 0 2 3  ?Total GAD 7 Score '13 6 17 20  '$ ? ? ? ?

## 2021-06-04 NOTE — BH Specialist Note (Signed)
Integrated Behavioral Health via Telemedicine Visit ? ?06/18/2021 ?Krystal [name] ?[id number] ? ?Number of [facility] Clinician visits: 2- Second Visit ? ?Session Start time: 1916 ?  ?Session End time: 6060 ? ?Total time in minutes: 57 ? ? ?Referring Provider: Darron Doom, MD ?Patient/Family location: Home ? ?[facility] Provider location:[facility] [employer] at [facility] ? ? ?All persons participating in visit: [name] and Krystal [name]  ? ?Types of Service: Individual psychotherapy and Video visit ? ?I connected with Krystal [name] and/or Krystal [name]'s  n/a  via  Telephone or Video Enabled Telemedicine Application  (Video is Caregility application) and verified that I am speaking with the correct person using two identifiers. Discussed confidentiality: Yes  ? ?I discussed the limitations of telemedicine and the availability of in person appointments.  Discussed there is a possibility of technology failure and discussed alternative modes of communication if that failure occurs. ? ?I discussed that engaging in this telemedicine visit, they consent to the provision of behavioral healthcare and the services will be billed under their insurance. ? ?Patient and/or legal guardian expressed understanding and consented to Telemedicine visit: Yes  ? ?Presenting Concerns: ?Patient and/or family reports the following symptoms/concerns: Distress over uncertainty regarding father's health condition, and other life stressors; vomiting and nightmares both reduced in past two weeks. ?Duration of problem: Ongoing; Severity of problem: moderate ? ?Patient and/or Family's Strengths/Protective Factors: ?Concrete supports in place (healthy food, safe environments, etc.) and Sense of purpose ? ?Goals Addressed: ?Patient will: ? Reduce symptoms of: anxiety, depression, and stress  ? Demonstrate ability to: Increase healthy adjustment to current life  circumstances ? ?Progress towards Goals: ?Ongoing ? ?Interventions: ?Interventions utilized:  Supportive Reflection ?Standardized Assessments completed:  PHQ9/GAD7 given within two weeks ? ?Patient and/or Family Response: Patient agrees with treatment plan. ? ? ?Assessment: ?Patient currently experiencing Major depressive disorder, severe, recurrent, without psychotic features and PTSD (both previously diagnosed by psychiatry).  ? ?Patient may benefit from continued therapy. ? ?Plan: ?Follow up with behavioral health clinician on : Three weeks ?Behavioral recommendations:  ?-Continue prioritizing healthy self-care as much as able daily (healthy sleep, naps as needed, routine meals, time outdoors) ?-Consider spending energy on things within personal realm of control only; continue communication with siblings as needed ?Referral(s): Krystal [name] (In Clinic) ? ?I discussed the assessment and treatment plan with the patient and/or parent/guardian. They were provided an opportunity to ask questions and all were answered. They agreed with the plan and demonstrated an understanding of the instructions. ?  ?They were advised to call back or seek an in-person evaluation if the symptoms worsen or if the condition fails to improve as anticipated. ? ?Garlan Fair, LCSW ? ? ?  06/13/2021  ? 11:47 AM 05/23/2021  ? 10:42 AM 04/01/2021  ? 10:52 AM 12/12/2020  ? 11:03 AM 10/09/2020  ?  5:26 PM  ?Depression screen PHQ 2/9  ?Decreased Interest '1 1 3  3  '$ ?Down, Depressed, Hopeless 1 1 0  2  ?PHQ - 2 Score '2 2 3  5  '$ ?Altered sleeping '3 3 3  3  '$ ?Tired, decreased energy '2 2 2  2  '$ ?Change in appetite 0 '1 1  3  '$ ?Feeling bad or failure about yourself  '1 1 1  2  '$ ?Trouble concentrating '1 1 1  3  '$ ?Moving slowly or fidgety/restless '1 2 1  1  '$ ?Suicidal thoughts 0 0 0  0  ?  PHQ-9 Score '10 12 12  19  '$ ?Difficult doing work/chores       ?  ? Information is confidential and restricted. Go to Review Flowsheets to unlock data.   ? ? ?  06/13/2021  ? 11:48 AM 05/23/2021  ? 10:42 AM 04/01/2021  ? 10:53 AM 10/09/2020  ?  5:27 PM  ?GAD 7 : Generalized Anxiety Score  ?Nervous, Anxious, on Edge '3 3 2 3  '$ ?Control/stop worrying '1 1 1 2  '$ ?Worry too much - different things 1 1 0 3  ?Trouble relaxing 0 2 0 3  ?Restless '1 1 1 1  '$ ?Easily annoyed or irritable '3 3 2 3  '$ ?Afraid - awful might happen 2 2 0 2  ?Total GAD 7 Score '11 13 6 17  '$ ? ? ? ?

## 2021-06-05 ENCOUNTER — Telehealth: Payer: Self-pay

## 2021-06-05 ENCOUNTER — Other Ambulatory Visit: Payer: Managed Care, Other (non HMO)

## 2021-06-05 DIAGNOSIS — R0601 Orthopnea: Secondary | ICD-10-CM

## 2021-06-05 DIAGNOSIS — R600 Localized edema: Secondary | ICD-10-CM

## 2021-06-05 DIAGNOSIS — Z79899 Other long term (current) drug therapy: Secondary | ICD-10-CM

## 2021-06-05 DIAGNOSIS — I10 Essential (primary) hypertension: Secondary | ICD-10-CM

## 2021-06-05 NOTE — Telephone Encounter (Signed)
Lab called triage to report unable to obtain blood sample.  Pt reports has had N/V and can not keep anything down.  Will route to Dr. Johney Frame for advisement.  ?

## 2021-06-07 NOTE — Telephone Encounter (Signed)
Spoke with the patient who reports that the lab was unable to stick her. I have rescheduled for her to come back on Monday. Encouraged her to drink plenty of fluids prior to coming in. Patient verbalized understanding.  ?

## 2021-06-10 ENCOUNTER — Other Ambulatory Visit: Payer: Managed Care, Other (non HMO)

## 2021-06-10 ENCOUNTER — Telehealth: Payer: Self-pay | Admitting: *Deleted

## 2021-06-10 ENCOUNTER — Other Ambulatory Visit: Payer: Self-pay | Admitting: *Deleted

## 2021-06-10 DIAGNOSIS — O10011 Pre-existing essential hypertension complicating pregnancy, first trimester: Secondary | ICD-10-CM

## 2021-06-10 DIAGNOSIS — R0601 Orthopnea: Secondary | ICD-10-CM

## 2021-06-10 DIAGNOSIS — R600 Localized edema: Secondary | ICD-10-CM

## 2021-06-10 DIAGNOSIS — Z79899 Other long term (current) drug therapy: Secondary | ICD-10-CM

## 2021-06-10 DIAGNOSIS — I10 Essential (primary) hypertension: Secondary | ICD-10-CM

## 2021-06-10 NOTE — Telephone Encounter (Signed)
-----   Message from Rollen Sox, [facility] sent at 06/04/2021 10:09 AM EDT ----- ?Regarding: RE: 2 wks appointment ?Patient has been scheduled ? ?----- Message ----- ?From: Mack Guise B ?Sent: 06/04/2021   9:42 AM EDT ?To: Nuala Alpha, LPN, Armando Gang, # ?Subject: 2 wks appointment                             ? ?Maricela Bo,  ? ?Following up on message sent 05-31-21 regarding 2 week  for blood pressure clinic.  [name] is on vacation and I am checking for Dr. Johney Frame.    ? ? ?

## 2021-06-10 NOTE — Progress Notes (Signed)
Krystal ?

## 2021-06-11 LAB — BASIC METABOLIC PANEL
BUN/Creatinine Ratio: 17 (ref 9–23)
BUN: 12 mg/dL (ref 6–20)
CO2: 21 mmol/L (ref 20–29)
Calcium: 9.7 mg/dL (ref 8.7–10.2)
Chloride: 100 mmol/L (ref 96–106)
Creatinine, Ser: 0.69 mg/dL (ref 0.57–1.00)
Glucose: 77 mg/dL (ref 70–99)
Potassium: 4.1 mmol/L (ref 3.5–5.2)
Sodium: 137 mmol/L (ref 134–144)
eGFR: 118 mL/min/{1.73_m2} (ref >59)

## 2021-06-11 LAB — PRO B NATRIURETIC PEPTIDE: NT-Pro BNP: 165 pg/mL — ABNORMAL HIGH (ref 0–130)

## 2021-06-11 LAB — MAGNESIUM: Magnesium: 2 mg/dL (ref 1.6–2.3)

## 2021-06-13 ENCOUNTER — Ambulatory Visit (INDEPENDENT_AMBULATORY_CARE_PROVIDER_SITE_OTHER): Payer: Managed Care, Other (non HMO) | Admitting: Obstetrics & Gynecology

## 2021-06-13 VITALS — BP 148/83 | HR 102

## 2021-06-13 DIAGNOSIS — O10919 Unspecified pre-existing hypertension complicating pregnancy, unspecified trimester: Secondary | ICD-10-CM

## 2021-06-13 DIAGNOSIS — O0990 Supervision of high risk pregnancy, unspecified, unspecified trimester: Secondary | ICD-10-CM

## 2021-06-13 DIAGNOSIS — Z98891 History of uterine scar from previous surgery: Secondary | ICD-10-CM

## 2021-06-13 DIAGNOSIS — S68412A Complete traumatic amputation of left hand at wrist level, initial encounter: Secondary | ICD-10-CM

## 2021-06-13 DIAGNOSIS — F419 Anxiety disorder, unspecified: Secondary | ICD-10-CM

## 2021-06-13 MED ORDER — PANTOPRAZOLE SODIUM 40 MG PO TBEC: 40.0000 mg | DELAYED_RELEASE_TABLET | Freq: Every day | ORAL | 4 refills | Status: DC

## 2021-06-13 NOTE — Progress Notes (Signed)
? ?  PRENATAL VISIT NOTE ? ?Subjective:  ?Krystal [name] is a 31 y.o. [id number] at 54w5dbeing seen today for ongoing prenatal care.  She is currently monitored for the following issues for this high-risk pregnancy and has Chronic hypertension in pregnancy; Anxiety; Fibromyalgia; Gastroesophageal reflux disease; History of HELLP syndrome, currently pregnant; History of uterine scar from previous surgery; Hypothyroidism due to Hashimoto's thyroiditis; Interstitial cystitis; Lumbar disc herniation; Lumbar spondylosis; Obesity; Posttraumatic stress disorder; Primary insomnia; History of upper limb amputation, wrist, left ; [place]roiliac inflammation ([place]; Essential hypertension; MDD (major depressive disorder), recurrent episode, severe ([place]; Chronic urticaria; Perennial allergic rhinitis; Mild persistent asthma without complication; Supervision of high risk pregnancy, antepartum; History of preterm delivery, currently pregnant; Unwanted fertility; Attention deficit disorder; Chronic fatigue syndrome; Anosmia; and History of excision of intestinal structure on their problem list. ? ?Patient reports heartburn, nausea, and vomiting.  Contractions: Not present. Vag. Bleeding: None.  Movement: Absent. Denies leaking of fluid.  ? ?The following portions of the patient's history were reviewed and updated as appropriate: allergies, current medications, past family history, past medical history, past social history, past surgical history and problem list.  ? ?Objective:  ? ?Vitals:  ? 06/13/21 1119  ?BP: (!) 148/83  ?Pulse: (!) 102  ? ? ?Fetal Status: Fetal Heart Rate (bpm): 125   Movement: Absent    ? ?General:  Alert, oriented and cooperative. Patient is in no acute distress.  ?Skin: Skin is warm and dry. No rash noted.   ?Cardiovascular: Normal heart rate noted  ?Respiratory: Normal respiratory effort, no problems with respiration noted  ?Abdomen: Soft, gravid, appropriate for gestational age.  Pain/Pressure: Present      ?Pelvic: Cervical exam deferred        ?Extremities: Normal range of motion.     ?Mental Status: Normal mood and affect. Normal behavior. Normal judgment and thought content.  ? ?Assessment and Plan:  ?Pregnancy: [id number] 266w5d1. History of upper limb amputation, wrist, left  ? ? ?2. Chronic hypertension in pregnancy ?To see cardiology ? ?3. Supervision of high risk pregnancy, antepartum ?Reflux and nausea ?- pantoprazole (PROTONIX) 40 MG tablet; Take 1 tablet (40 mg total) by mouth daily.  Dispense: 30 tablet; Refill: 4 ? ?4. Anxiety ? ? ?5. History of uterine scar from previous surgery ?Plan RCS ? ?Preterm labor symptoms and general obstetric precautions including but not limited to vaginal bleeding, contractions, leaking of fluid and fetal movement were reviewed in detail with the patient. ?Please refer to After Visit Summary for other counseling recommendations.  ? ?Return in about 4 weeks (around 07/11/2021). ? ?Future Appointments  ?Date Time Provider [place]?06/17/2021 11:00 AM GhPhilemon KingdomMD LBPC-LBENDO None  ?06/17/2021  3:05 PM MC-CV CH ECHO 5 [facility]-SITE3ECHO [facility]  ?06/18/2021  3:45 PM WMTatumMC[facility]CWH [facility]?06/19/2021  9:00 AM CVD-NLINE PHARMACIST CVD-NORTHLIN [facility]  ?06/26/2021 11:15 AM KiGarnet SierrasDO AAC-GSO None  ?06/27/2021  9:40 AM PeFreada BergeronMD CVD-CHUSTOFF [facility]  ?07/02/2021 10:15 AM WMC-MFC NURSE WMC-MFC WMC  ?07/02/2021 10:30 AM WMC-MFC US2 WMC-MFCUS [facility]?07/02/2021 12:00 PM WMC-MFC MD RM WMC-MFC WMC  ?07/08/2021  1:15 PM ArWoodroe ModeMD [facility]?08/05/2021  8:15 AM PiAletha HalimMD [facility]?08/05/2021  8:50 AM WMC-WOCA LAB WMC-CWH WMC  ? ? ?JaEmeterio ReeveMD ? ?

## 2021-06-13 NOTE — Progress Notes (Signed)
Patient stated that she hasn't felt baby move in about 5 days. Denies any vaginal bleeding but reports lower back pain.  ? ?Upadhyay also reports "abnormal heart rate". She stated the improper palpations started "sometime last week" and would happen at random times. She informed me that she does have an upcoming visit with her cardiologist next week. ?

## 2021-06-17 ENCOUNTER — Telehealth (INDEPENDENT_AMBULATORY_CARE_PROVIDER_SITE_OTHER): Payer: Managed Care, Other (non HMO) | Admitting: Internal Medicine

## 2021-06-17 ENCOUNTER — Ambulatory Visit (HOSPITAL_COMMUNITY): Payer: Commercial Managed Care - HMO | Attending: Internal Medicine

## 2021-06-17 ENCOUNTER — Encounter: Payer: Self-pay | Admitting: Internal Medicine

## 2021-06-17 DIAGNOSIS — O10912 Unspecified pre-existing hypertension complicating pregnancy, second trimester: Secondary | ICD-10-CM | POA: Insufficient documentation

## 2021-06-17 DIAGNOSIS — O99512 Diseases of the respiratory system complicating pregnancy, second trimester: Secondary | ICD-10-CM | POA: Insufficient documentation

## 2021-06-17 DIAGNOSIS — Z79899 Other long term (current) drug therapy: Secondary | ICD-10-CM | POA: Insufficient documentation

## 2021-06-17 DIAGNOSIS — E039 Hypothyroidism, unspecified: Secondary | ICD-10-CM

## 2021-06-17 DIAGNOSIS — O1202 Gestational edema, second trimester: Secondary | ICD-10-CM | POA: Diagnosis not present

## 2021-06-17 DIAGNOSIS — R600 Localized edema: Secondary | ICD-10-CM | POA: Diagnosis present

## 2021-06-17 DIAGNOSIS — R0601 Orthopnea: Secondary | ICD-10-CM | POA: Diagnosis present

## 2021-06-17 DIAGNOSIS — O99281 Endocrine, nutritional and metabolic diseases complicating pregnancy, first trimester: Secondary | ICD-10-CM | POA: Diagnosis not present

## 2021-06-17 DIAGNOSIS — I10 Essential (primary) hypertension: Secondary | ICD-10-CM | POA: Insufficient documentation

## 2021-06-17 DIAGNOSIS — Z3A21 21 weeks gestation of pregnancy: Secondary | ICD-10-CM | POA: Insufficient documentation

## 2021-06-17 LAB — ECHOCARDIOGRAM COMPLETE
Area-P 1/2: 3.48 cm2
S' Lateral: 2.9 cm

## 2021-06-17 NOTE — Progress Notes (Signed)
Patient ID: Krystal [name], female   DOB: [birth year], 31 y.o.   MRN: [id number] ? ?Patient location: Home ?My location: Office ?Persons participating in the virtual visit: patient, provider ? ?Referring Provider: Audley Hose, MD ? ?I connected with the patient on 06/17/21 at 9:18 by a video enabled telemedicine application and verified that I am speaking with the correct person. ?  ?I discussed the limitations of evaluation and management by telemedicine and the availability of in person appointments. The patient expressed understanding and agreed to proceed. ?  ?Details of the encounter are shown below. ? ?HPI  ?Krystal [name] is a 31 y.o.-year-old female, initially referred by Audley Hose, MD, returning for follow-up for Hashimoto's hypothyroidism -during pregnancy.  Last visit 2.5 months ago. ? ?Interim history: ?Patient had a positive urine pregnancy test on 02/20/2021 (11 weeks).  Pregnancy was confirmed and estimated due date is 10/26/2021.  She is now [redacted] weeks along. ?She has a lot of nausea and is vomiting more than 5 times a day.  She occasionally vomits the levothyroxine pills any neck if she takes to the next day. ? ?Reviewed and addended history: ?Pt. has been dx with hypothyroidism in 2020 after her pregnancy >> on Levothyroxine 112 mcg now, just restarted this 1 week ago after 2-3 months off - ran out 10/2020, as she changed doctors. We increased to 125 mcg in 04/2021. ? ?She takes the thyroid hormone: ?- fasting ?- with water ?- coffee + creamer + sweetener >> moved >30 min later - rarely 2/2 nausea ?- separated by 1h from b'fast  ?- + calcium and vitamin D at lunchtime ?- no iron ?- + Pepcid >> changed to Protonix at lunchtime ?- + prenatal multivitamins at lunchtime ? ?I reviewed pt's thyroid tests: ?Lab Results  ?Component Value Date  ? TSH 3.790 05/23/2021  ? TSH 11.272 (H) 03/31/2021  ? TSH 3.040 01/25/2021  ? FREET4 1.22 05/23/2021  ? FREET4 0.84 03/31/2021  ?07/19/2020: TSH  3.68 ?04/05/2020: TSH 2.69 ? ?Antithyroid antibodies: ?Component ?    Latest Ref Rng 05/23/2021  ?Thyroglobulin Antibody ?    0.0 - 0.9 IU/mL 11.8 (H)   ?Thyroperoxidase Ab SerPl-aCnc ?    0 - 34 IU/mL 90 (H)   ?  ?At last visit she mentioned: ?- + nausea, vomiting ?- + fatigue - started  before she restarted LT4 ?- + cold (rigors) and heat intolerance  ?- + weight gain ?- + depression (improved) /+ anxiety (increased)/+ irritability ?- + dry skin ?- no constipation ?- improved hair loss ? ?She continues to have nausea and vomiting as mentioned in the HPI.  She also feels dehydrated. ? ?Pt denies feeling nodules in neck, hoarseness, dysphagia/odynophagia. ? ?She has + FH of thyroid disorders: 2 older sister with hypothyroidism, hyperthyroidism. No FH of thyroid cancer.  ?No h/o radiation tx to head or neck. ?No recent use of iodine supplements. ? ?Pt. also has a history of essential hypertension, PTSD, GERD, interstitial cystitis, asthma, fibromyalgia, chronic fatigue, primary insomnia, anxiety, ADD. ? 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 ? ?ROS: ?Constitutional: + see HPI, ? ?Past Medical History:  ?Diagnosis Date  ? Acute acalculous cholecystitis s/p lap cholecystectomy 05/11/2020 05/11/2020  ? Angio-edema 01/15/2021  ? Anxiety   ? Asthma   ? 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 IC (interstitial cystitis)   ?  Kidney stones   ? Migraines   ? Severe uncontrolled hypertension 10/08/2017  ? UTI (urinary tract infection)   ? ?Past Surgical History:  ?Procedure Laterality Date  ? APPENDECTOMY    ? BIOPSY  05/10/2020  ? Procedure: BIOPSY;  Surgeon: Arta Silence, MD;  Location: [facility];  Service: Endoscopy;;  ? CESAREAN SECTION N/A 12/06/2017  ? Procedure: CESAREAN SECTION;  Surgeon: Sloan Leiter, MD;  Location: [facility];  Service: Obstetrics;  Laterality:  N/A;  ? CHOLECYSTECTOMY N/A 05/11/2020  ? Procedure: SINGLE SITE LAPAROSCOPIC CHOLECYSTECTOMY AND LIVER BIOSY;  Surgeon: Michael Boston, MD;  Location: [facility];  Service: General;  Laterality: N/A;  ? ESOPHAGOGASTRODUODENOSCOPY (EGD) WITH PROPOFOL N/A 05/10/2020  ? Procedure: ESOPHAGOGASTRODUODENOSCOPY (EGD) WITH PROPOFOL;  Surgeon: Arta Silence, MD;  Location: [facility];  Service: Endoscopy;  Laterality: N/A;  ? 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 HAND RECONSTRUCTION Right   ? INCISION AND DRAINAGE    ? NASAL SEPTUM SURGERY    ? ?Social History  ? ?Socioeconomic History  ? Marital status: Married  ?  Spouse name: Not on file  ? Number of children: Not on file  ? Years of education: Not on file  ? Highest education level: Not on file  ?Occupational History  ? Not on file  ?Tobacco Use  ? Smoking status: Never  ? Smokeless tobacco: Never  ? Tobacco comments:  ?  She tried a cigarette and never smoked again  ?Vaping Use  ? Vaping Use: Never used  ?Substance and Sexual Activity  ? Alcohol use: Not Currently  ?  Comment: bottle of wine a week-occ  ? Drug use: No  ? Sexual activity: Yes  ?Other Topics Concern  ? Not on file  ?Social History Narrative  ? Not on file  ? ?Social Determinants of Health  ? ?Financial Resource Strain: Not on file  ?Food Insecurity: No Food Insecurity  ? Worried About Charity fundraiser in the Last Year: Never true  ? Ran Out of Food in the Last Year: Never true  ?Transportation Needs: Unmet Transportation Needs  ? Lack of Transportation (Medical): Yes  ? Lack of Transportation (Non-Medical): Yes  ?Physical Activity: Not on file  ?Stress: Not on file  ?Social Connections: Not on file  ?Intimate Partner Violence: Not on file  ? ?Current Outpatient Medications on File Prior to Visit  ?Medication Sig Dispense Refill  ? acetaminophen (TYLENOL) 500 MG tablet Take 2 tablets (1,000 mg total) by mouth every 6 (six) hours as needed. 30 tablet 0  ? albuterol (PROVENTIL HFA;VENTOLIN  HFA) 108 (90 Base) MCG/ACT inhaler Inhale 2 puffs into the lungs every 6 (six) hours as needed for wheezing or shortness of breath.    ? amLODipine (NORVASC) 10 MG tablet Take 1 tablet (10 mg total) by mouth daily. 90 tablet 2  ? aspirin EC 81 MG tablet Take 1 tablet (81 mg total) by mouth daily. Take after 12 weeks for prevention of preeclampsia later in pregnancy 300 tablet 2  ? budesonide-formoterol (SYMBICORT) 80-4.5 MCG/ACT inhaler Inhale 2 puffs into the lungs in the morning and at bedtime. Take for 1-2 weeks at a time during asthma flare. Rinse mouth after each use. 1 each 3  ? cetirizine (ZYRTEC ALLERGY) 10 MG tablet Take 1 tablet (10 mg total) by mouth 2 (two) times daily. 60 tablet 5  ? cholecalciferol (VITAMIN D3) 25 MCG (1000 UNIT) tablet Take 1,000 Units by mouth  daily.    ? EPINEPHrine 0.3 mg/0.3 mL IJ SOAJ injection Inject 0.3 mg into the muscle as needed for anaphylaxis. (Patient not taking: Reported on 06/13/2021) 1 each 2  ? famotidine (PEPCID) 20 MG tablet Take 1 tablet (20 mg total) by mouth 2 (two) times daily. 60 tablet 5  ? hydrALAZINE (APRESOLINE) 100 MG tablet Take 1 tablet (100 mg total) by mouth 3 (three) times daily. 90 tablet 1  ? hydrOXYzine (VISTARIL) 25 MG capsule Take 1-2 capsules (25-50 mg total) by mouth 3 (three) times daily as needed for itching. 60 capsule 1  ? labetalol (NORMODYNE) 300 MG tablet Take 0.5 tablets (150 mg total) by mouth 3 (three) times daily. 135 tablet 1  ? levothyroxine (SYNTHROID) 125 MCG tablet Take 1 tablet (125 mcg total) by mouth daily before breakfast. 45 tablet 3  ? ondansetron (ZOFRAN-ODT) 8 MG disintegrating tablet Take 1 tablet (8 mg total) by mouth every 8 (eight) hours as needed for nausea or vomiting. 20 tablet 2  ? pantoprazole (PROTONIX) 40 MG tablet Take 1 tablet (40 mg total) by mouth daily. 30 tablet 4  ? potassium chloride SA (KLOR-CON M) 20 MEQ tablet Take 1 tablet (20 mEq total) by mouth daily for 7 days. 7 tablet 0  ? Prenatal  Multivit-Min-Fe-FA (PRE-NATAL PO) Take by mouth.    ? promethazine (PHENERGAN) 25 MG tablet Take 1 tablet (25 mg total) by mouth every 6 (six) hours as needed for nausea or vomiting. 30 tablet 2  ? scopolamine (TRA

## 2021-06-17 NOTE — Patient Instructions (Signed)
Please continue Levothyroxine 125 mcg daily. ? ?Take the thyroid hormone every day, with water, at least 30 minutes before breakfast, separated by at least 4 hours from: ?- acid reflux medications ?- calcium ?- iron ?- multivitamins ? ?Please come back for labs in 1 week. ? ?Come back for a visit in ~3 months. ? ? ?

## 2021-06-18 ENCOUNTER — Ambulatory Visit (INDEPENDENT_AMBULATORY_CARE_PROVIDER_SITE_OTHER): Payer: Medicaid Other | Admitting: Clinical

## 2021-06-18 DIAGNOSIS — F332 Major depressive disorder, recurrent severe without psychotic features: Secondary | ICD-10-CM

## 2021-06-18 DIAGNOSIS — F4310 Post-traumatic stress disorder, unspecified: Secondary | ICD-10-CM

## 2021-06-19 ENCOUNTER — Encounter: Payer: Self-pay | Admitting: Pharmacist Clinician (PhC)/ Clinical Pharmacy Specialist

## 2021-06-19 ENCOUNTER — Ambulatory Visit (INDEPENDENT_AMBULATORY_CARE_PROVIDER_SITE_OTHER): Payer: Managed Care, Other (non HMO) | Admitting: Pharmacist Clinician (PhC)/ Clinical Pharmacy Specialist

## 2021-06-19 DIAGNOSIS — O10919 Unspecified pre-existing hypertension complicating pregnancy, unspecified trimester: Secondary | ICD-10-CM

## 2021-06-19 NOTE — Assessment & Plan Note (Signed)
Patient with hypertension, now 20w4dpregnant.  Blood pressure good in the office today, so will make no changes to medications.   Reviewed need for close monitoring and appropriate adjustments of her BP medications.  Fortunately will not need to consider hydrochlorothiazide today.  She has completed 7 days of potassium supplementation and notes less vomiting, so will hold off on re-checking metabolic panel until next visit.  She will follow up with Dr. PJohney Framein 2 weeks and we can be scheduled for follow up after that as appropriate.   ?

## 2021-06-19 NOTE — Progress Notes (Signed)
? ? ? ? ? ?06/19/2021 ?Cadince C Tebbetts ?04/28/1990 ?[id number] ? ? ?Krystal [name] is a 31 y.o. pregnant female referred by Dr. Johney Frame for HTN consult from cardio obstetrics clinic.  Patient is 16 weeks 6 days pregnant, [id number].  Has history of resistant hypertension, preeclampsia, and HELLP.  Also has history of treatment resistant depression and anxiety. She has had trouble throughout pregnancy to date, due to ongoing nausea/vomiting and dizziness.   We have been following her closely and have been adjusting labetalol in hopes of keeping BP down, as well as limiting N/V, which is in part due to medication.   She has had enough vomiting that she became hypokalemic and was given 7 day potassium supplementation.   ? ?Today she returns for follow up.  Today she is [redacted]w[redacted]d  Doing better, less nighttime vomiting since started pantoprazole.  She still has some shortness of breath and also notes that when she's walking can sometimes feel a tightness across her chest.   Reviewed labs and echocardiogram in office and answered the questions I was able.   ? ?3/24 - cards ([facility]ia Heffelfinger) 148/84 Decreased labetalol to 200 mg tid, added amlodipine 5 mg, continue hydralazine 100 mg tid   ?3/30 - OB 156/100 Added scopolamine patch; K at 3.1, given KCl 20 mEq qd x 7d   ?4/7 - cards ([place]) 152/89   (virtual - pt device) Increased SOB; echo and BNP ordered;  decrease labetalol to 150 mg tid (can take 100 mg for lower pressure or 200 mg if elevated), increased amlodipine to 10 mg  BNP 165; echo showed normal pumping function, mild mitral leak (common in pregnancy); slight wall thickening 2/2 long term hypertension4/24 - e  ?4/24 - endo Virtual, not recorded Increased Levothyroxine to 0.125 mg   ? ? ?Blood Pressure Goal:  <140/90 per OB ? ?Current Medications: labetalol 300 mg tid, hydralazine 100 mg tid ? ?Diet: having difficulty with solid foods, now drinking plant based protein shakes at least twice daily; drinks sugar free  flavored water - about 3 16-20 oz bottles per day ? ?Home BP readings:  ? States are mostly in range allowing her to take labetalol 150 mg tid.  Occasionally will take the 100 mg or 200 mg dose ? ?Intolerances: nifedipine - nausea/vomiting ? ?Labs:  05/08/21:  Na 135, K 3.1, Glu 93, BUN 8, SCr 0.59, GFR > 60 ? ? ?Wt Readings from Last 3 Encounters:  ?06/19/21 227 lb 9.6 oz (103.2 kg)  ?05/23/21 223 lb (101.2 kg)  ?05/08/21 218 lb 1.6 oz (98.9 kg)  ? ?BP Readings from Last 3 Encounters:  ?06/19/21 134/86  ?06/13/21 (!) 148/83  ?06/03/21 (!) 147/91  ? ?Pulse Readings from Last 3 Encounters:  ?06/19/21 85  ?06/13/21 (!) 102  ?06/03/21 77  ? ? ?Current Outpatient Medications  ?Medication Sig Dispense Refill  ? acetaminophen (TYLENOL) 500 MG tablet Take 2 tablets (1,000 mg total) by mouth every 6 (six) hours as needed. 30 tablet 0  ? albuterol (PROVENTIL HFA;VENTOLIN HFA) 108 (90 Base) MCG/ACT inhaler Inhale 2 puffs into the lungs every 6 (six) hours as needed for wheezing or shortness of breath.    ? amLODipine (NORVASC) 10 MG tablet Take 1 tablet (10 mg total) by mouth daily. 90 tablet 2  ? aspirin EC 81 MG tablet Take 1 tablet (81 mg total) by mouth daily. Take after 12 weeks for prevention of preeclampsia later in pregnancy 300 tablet 2  ? budesonide-formoterol (SYMBICORT) 80-4.5 MCG/ACT inhaler  Inhale 2 puffs into the lungs in the morning and at bedtime. Take for 1-2 weeks at a time during asthma flare. Rinse mouth after each use. 1 each 3  ? cetirizine (ZYRTEC ALLERGY) 10 MG tablet Take 1 tablet (10 mg total) by mouth 2 (two) times daily. 60 tablet 5  ? cholecalciferol (VITAMIN D3) 25 MCG (1000 UNIT) tablet Take 1,000 Units by mouth daily.    ? EPINEPHrine 0.3 mg/0.3 mL IJ SOAJ injection Inject 0.3 mg into the muscle as needed for anaphylaxis. 1 each 2  ? famotidine (PEPCID) 20 MG tablet Take 1 tablet (20 mg total) by mouth 2 (two) times daily. 60 tablet 5  ? hydrALAZINE (APRESOLINE) 100 MG tablet Take 1 tablet (100  mg total) by mouth 3 (three) times daily. 90 tablet 1  ? hydrOXYzine (VISTARIL) 25 MG capsule Take 1-2 capsules (25-50 mg total) by mouth 3 (three) times daily as needed for itching. 60 capsule 1  ? labetalol (NORMODYNE) 300 MG tablet Take 0.5 tablets (150 mg total) by mouth 3 (three) times daily. 135 tablet 1  ? levothyroxine (SYNTHROID) 125 MCG tablet Take 1 tablet (125 mcg total) by mouth daily before breakfast. 45 tablet 3  ? ondansetron (ZOFRAN-ODT) 8 MG disintegrating tablet Take 1 tablet (8 mg total) by mouth every 8 (eight) hours as needed for nausea or vomiting. 20 tablet 2  ? pantoprazole (PROTONIX) 40 MG tablet Take 1 tablet (40 mg total) by mouth daily. 30 tablet 4  ? potassium chloride SA (KLOR-CON M) 20 MEQ tablet Take 1 tablet (20 mEq total) by mouth daily for 7 days. 7 tablet 0  ? Prenatal Multivit-Min-Fe-FA (PRE-NATAL PO) Take by mouth.    ? promethazine (PHENERGAN) 25 MG tablet Take 1 tablet (25 mg total) by mouth every 6 (six) hours as needed for nausea or vomiting. 30 tablet 2  ? scopolamine (TRANSDERM-SCOP) 1 MG/3DAYS Place 1 patch (1.5 mg total) onto the skin every 3 (three) days. 10 patch 12  ? venlafaxine XR (EFFEXOR-XR) 150 MG 24 hr capsule Take 150 mg by mouth daily.    ? ?No current facility-administered medications for this visit.  ? ? ?Allergies  ?Allergen Reactions  ? Red Dye Other (See Comments)  ?  ALL CAUSE PROBLEMS ?#40 IS WORST-MIGRAINES   ? Compazine [Prochlorperazine Maleate] Other (See Comments)  ?  TWITCHING, CANT STAY STILL. Pt states she can tolerate promethazine  ? Other Other (See Comments)  ?  FANSIDAR FOR MALARIA-ASTHMA  ? Vicodin [Hydrocodone-Acetaminophen] Itching, Nausea And Vomiting and Other (See Comments)  ?  Pt states she can tolerate acetaminophen  ? Amitriptyline Hcl Other (See Comments)  ?  Angry moods  ? Duloxetine Hcl Other (See Comments)  ?  Passing out, trimble, irritable  ? Reglan [Metoclopramide] Other (See Comments)  ?  restless  ? Nifedipine Nausea And  Vomiting and Other (See Comments)  ?  Discussed with patient and maternal fetal medicine, patient has intolerance but not frank allergy.  ? ? ?Past Medical History:  ?Diagnosis Date  ? Acute acalculous cholecystitis s/p lap cholecystectomy 05/11/2020 05/11/2020  ? Angio-edema 01/15/2021  ? Anxiety   ? Asthma   ? 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 IC (interstitial cystitis)   ? Kidney stones   ? Migraines   ? Severe uncontrolled hypertension 10/08/2017  ? UTI (urinary tract infection)   ? ? ?Blood pressure 134/86, pulse 85, resp. rate 15, height '5\' 11"'$  (1.803 m), weight 227 lb  9.6 oz (103.2 kg), last menstrual period 01/19/2021, SpO2 100 %, not currently breastfeeding. ? ?Chronic hypertension in pregnancy ?Patient with hypertension, now 64w4dpregnant.  Blood pressure good in the office today, so will make no changes to medications.   Reviewed need for close monitoring and appropriate adjustments of her BP medications.  Fortunately will not need to consider hydrochlorothiazide today.  She has completed 7 days of potassium supplementation and notes less vomiting, so will hold off on re-checking metabolic panel until next visit.  She will follow up with Dr. PJohney Framein 2 weeks and we can be scheduled for follow up after that as appropriate.   ? ? ?KTommy MedalPharmD CPP CInd[facility]?CSanta Venetia?[address] ?[place] [address]?[phone]?

## 2021-06-19 NOTE — Patient Instructions (Addendum)
Return for a a follow up appointment with Dr. Johney Frame on May 12 ? ?Check your blood pressure at home daily and keep record of the readings. ? ?Take your BP meds as follows: ? Continue with all current medications ? ?Bring all of your meds, your BP cuff and your record of home blood pressures to your next appointment.  Exercise as you?re able, try to walk approximately 30 minutes per day.  Keep salt intake to a minimum, especially watch canned and prepared boxed foods.  Eat more fresh fruits and vegetables and fewer canned items.  Avoid eating in fast food restaurants.  ? ? HOW TO TAKE YOUR BLOOD PRESSURE: ?Rest 5 minutes before taking your blood pressure. ? Don?t smoke or drink caffeinated beverages for at least 30 minutes before. ?Take your blood pressure before (not after) you eat. ?Sit comfortably with your back supported and both feet on the floor (don?t cross your legs). ?Elevate your arm to heart level on a table or a desk. ?Use the proper sized cuff. It should fit smoothly and snugly around your bare upper arm. There should be enough room to slip a fingertip under the cuff. The bottom edge of the cuff should be 1 inch above the crease of the elbow. ?Ideally, take 3 measurements at one sitting and record the average. ? ? ?

## 2021-06-25 NOTE — BH Specialist Note (Deleted)
Integrated Behavioral Health via Telemedicine Visit  06/25/2021 Krystal [name] [id number]  Number of Integrated Behavioral Health Clinician visits: 2- Second Visit  Session Start time: 6759   Session End time: 1638  Total time in minutes: 57   Referring Provider: *** Patient/Family location: [facility] Provider location: *** All persons participating in visit: *** Types of Service: {CHL AMB TYPE OF SERVICE:[id number]}  I connected with Krystal [name] and/or Krystal [name]'s {family members:20773} via  Telephone or Video Enabled Telemedicine Application  (Video is Caregility application) and verified that I am speaking with the correct person using two identifiers. Discussed confidentiality: {YES/NO:21197}  I discussed the limitations of telemedicine and the availability of in person appointments.  Discussed there is a possibility of technology failure and discussed alternative modes of communication if that failure occurs.  I discussed that engaging in this telemedicine visit, they consent to the provision of behavioral healthcare and the services will be billed under their insurance.  Patient and/or legal guardian expressed understanding and consented to Telemedicine visit: {YES/NO:21197}  Presenting Concerns: Patient and/or family reports the following symptoms/concerns: *** Duration of problem: ***; Severity of problem: {Mild/Moderate/Severe:20260}  Patient and/or Family's Strengths/Protective Factors: {CHL AMB BH PROTECTIVE FACTORS:[phone]}  Goals Addressed: Patient will:  Reduce symptoms of: {IBH Symptoms:21014056}   Increase knowledge and/or ability of: {IBH Patient Tools:21014057}   Demonstrate ability to: {IBH Goals:21014053}  Progress towards Goals: {CHL AMB BH PROGRESS TOWARDS GOALS:[phone]}  Interventions: Interventions utilized:  {IBH Interventions:21014054} Standardized Assessments completed: {IBH Screening Tools:21014051}  Patient  and/or Family Response: ***  Assessment: Patient currently experiencing ***.   Patient may benefit from ***.  Plan: Follow up with behavioral health clinician on : *** Behavioral recommendations: *** Referral(s): {IBH Referrals:21014055}  I discussed the assessment and treatment plan with the patient and/or parent/guardian. They were provided an opportunity to ask questions and all were answered. They agreed with the plan and demonstrated an understanding of the instructions.   They were advised to call back or seek an in-person evaluation if the symptoms worsen or if the condition fails to improve as anticipated.  Krystal Hamman Kush [place], LCSW

## 2021-06-26 ENCOUNTER — Ambulatory Visit: Payer: Managed Care, Other (non HMO) | Admitting: Allergy

## 2021-06-27 ENCOUNTER — Other Ambulatory Visit: Payer: Self-pay

## 2021-06-27 ENCOUNTER — Encounter (HOSPITAL_COMMUNITY): Payer: Self-pay | Admitting: Obstetrics & Gynecology

## 2021-06-27 ENCOUNTER — Ambulatory Visit: Payer: Managed Care, Other (non HMO) | Admitting: Cardiology

## 2021-06-27 ENCOUNTER — Inpatient Hospital Stay (HOSPITAL_COMMUNITY)
Admission: AD | Admit: 2021-06-27 | Discharge: 2021-06-27 | Disposition: A | Discharge status: Home / Self Care | Payer: Commercial Managed Care - HMO | Attending: Obstetrics & Gynecology | Admitting: Obstetrics & Gynecology

## 2021-06-27 DIAGNOSIS — O99891 Other specified diseases and conditions complicating pregnancy: Secondary | ICD-10-CM | POA: Insufficient documentation

## 2021-06-27 DIAGNOSIS — R519 Headache, unspecified: Secondary | ICD-10-CM | POA: Insufficient documentation

## 2021-06-27 DIAGNOSIS — O10919 Unspecified pre-existing hypertension complicating pregnancy, unspecified trimester: Secondary | ICD-10-CM

## 2021-06-27 DIAGNOSIS — O34211 Maternal care for low transverse scar from previous cesarean delivery: Secondary | ICD-10-CM | POA: Diagnosis not present

## 2021-06-27 DIAGNOSIS — O26892 Other specified pregnancy related conditions, second trimester: Secondary | ICD-10-CM | POA: Diagnosis not present

## 2021-06-27 DIAGNOSIS — R079 Chest pain, unspecified: Secondary | ICD-10-CM | POA: Diagnosis not present

## 2021-06-27 DIAGNOSIS — O10012 Pre-existing essential hypertension complicating pregnancy, second trimester: Secondary | ICD-10-CM | POA: Insufficient documentation

## 2021-06-27 DIAGNOSIS — Z3A22 22 weeks gestation of pregnancy: Secondary | ICD-10-CM | POA: Insufficient documentation

## 2021-06-27 DIAGNOSIS — Z7982 Long term (current) use of aspirin: Secondary | ICD-10-CM | POA: Diagnosis not present

## 2021-06-27 DIAGNOSIS — Z79899 Other long term (current) drug therapy: Secondary | ICD-10-CM | POA: Insufficient documentation

## 2021-06-27 LAB — URINALYSIS, MICROSCOPIC (REFLEX)

## 2021-06-27 LAB — URINALYSIS, ROUTINE W REFLEX MICROSCOPIC
Glucose, UA: NEGATIVE mg/dL
Ketones, ur: NEGATIVE mg/dL
Leukocytes,Ua: NEGATIVE
Nitrite: NEGATIVE
Protein, ur: 100 mg/dL — AB
Specific Gravity, Urine: >1.03 — ABNORMAL HIGH (ref 1.005–1.030)
pH: 5.5 (ref 5.0–8.0)

## 2021-06-27 LAB — PROTEIN / CREATININE RATIO, URINE
Creatinine, Urine: 760 mg/dL
Protein Creatinine Ratio: 0.17 mg/mg{Cre} — ABNORMAL HIGH (ref 0.00–0.15)
Total Protein, Urine: 126 mg/dL

## 2021-06-27 LAB — COMPREHENSIVE METABOLIC PANEL
ALT: 19 U/L (ref 0–44)
AST: 22 U/L (ref 15–41)
Albumin: 3.6 g/dL (ref 3.5–5.0)
Alkaline Phosphatase: 52 U/L (ref 38–126)
Anion gap: 10 (ref 5–15)
BUN: 14 mg/dL (ref 6–20)
CO2: 24 mmol/L (ref 22–32)
Calcium: 9.1 mg/dL (ref 8.9–10.3)
Chloride: 101 mmol/L (ref 98–111)
Creatinine, Ser: 0.78 mg/dL (ref 0.44–1.00)
GFR, Estimated: >60 mL/min (ref >60)
Glucose, Bld: 79 mg/dL (ref 70–99)
Potassium: 2.9 mmol/L — ABNORMAL LOW (ref 3.5–5.1)
Sodium: 135 mmol/L (ref 135–145)
Total Bilirubin: 0.8 mg/dL (ref 0.3–1.2)
Total Protein: 6.7 g/dL (ref 6.5–8.1)

## 2021-06-27 LAB — CBC
HCT: 36 % (ref 36.0–46.0)
Hemoglobin: 13.1 g/dL (ref 12.0–15.0)
MCH: 33 pg (ref 26.0–34.0)
MCHC: 36.4 g/dL — ABNORMAL HIGH (ref 30.0–36.0)
MCV: 90.7 fL (ref 80.0–100.0)
Platelets: 326 10*3/uL (ref 150–400)
RBC: 3.97 MIL/uL (ref 3.87–5.11)
RDW: 11.9 % (ref 11.5–15.5)
WBC: 11.1 10*3/uL — ABNORMAL HIGH (ref 4.0–10.5)
nRBC: 0 % (ref 0.0–0.2)

## 2021-06-27 LAB — BRAIN NATRIURETIC PEPTIDE: B Natriuretic Peptide: 28.9 pg/mL (ref 0.0–100.0)

## 2021-06-27 LAB — TROPONIN I (HIGH SENSITIVITY): Troponin I (High Sensitivity): 12 ng/L (ref <18)

## 2021-06-27 MED ORDER — LACTATED RINGERS IV BOLUS
1000.0000 mL | Freq: Once | INTRAVENOUS | Status: AC
  Administered 2021-06-27: 1000 mL via INTRAVENOUS

## 2021-06-27 MED ORDER — LABETALOL HCL 5 MG/ML IV SOLN
20.0000 mg | INTRAVENOUS | Status: DC | PRN
  Administered 2021-06-27: 20 mg via INTRAVENOUS
  Filled 2021-06-27: qty 4

## 2021-06-27 MED ORDER — SODIUM CHLORIDE 0.9 % IV SOLN
8.0000 mg | Freq: Once | INTRAVENOUS | Status: AC
  Administered 2021-06-27: 8 mg via INTRAVENOUS
  Filled 2021-06-27: qty 4

## 2021-06-27 MED ORDER — AMLODIPINE BESYLATE 5 MG PO TABS: 5.0000 mg | ORAL_TABLET | Freq: Every day | ORAL | Status: DC

## 2021-06-27 MED ORDER — ASPIRIN 325 MG PO TABS
325.0000 mg | ORAL_TABLET | Freq: Once | ORAL | Status: DC
Start: 2021-06-27 — End: 2021-06-27
  Filled 2021-06-27: qty 1

## 2021-06-27 MED ORDER — PROMETHAZINE HCL 25 MG RE SUPP
25.0000 mg | Freq: Once | RECTAL | Status: AC
  Administered 2021-06-27: 25 mg via RECTAL
  Filled 2021-06-27: qty 1

## 2021-06-27 MED ORDER — SODIUM CHLORIDE 0.9 % IV SOLN
500.0000 mg | Freq: Once | INTRAVENOUS | Status: DC
  Filled 2021-06-27 (×2): qty 2

## 2021-06-27 MED ORDER — HYDRALAZINE HCL 50 MG PO TABS
100.0000 mg | ORAL_TABLET | Freq: Once | ORAL | Status: AC
  Administered 2021-06-27: 100 mg via ORAL
  Filled 2021-06-27: qty 2

## 2021-06-27 MED ORDER — LACTATED RINGERS IV BOLUS
1000.0000 mL | Freq: Once | INTRAVENOUS | Status: AC
Start: 2021-06-27 — End: 2021-06-27
  Administered 2021-06-27: 1000 mL via INTRAVENOUS

## 2021-06-27 MED ORDER — NIFEDIPINE 10 MG PO CAPS: 10.0000 mg | ORAL_CAPSULE | Freq: Once | ORAL | Status: DC

## 2021-06-27 MED ORDER — AMLODIPINE BESYLATE 5 MG PO TABS
5.0000 mg | ORAL_TABLET | Freq: Once | ORAL | Status: AC
Start: 2021-06-27 — End: 2021-06-27
  Administered 2021-06-27: 5 mg via ORAL
  Filled 2021-06-27: qty 1

## 2021-06-27 MED ORDER — LABETALOL HCL 5 MG/ML IV SOLN
80.0000 mg | INTRAVENOUS | Status: DC | PRN
  Administered 2021-06-27: 80 mg via INTRAVENOUS
  Filled 2021-06-27: qty 16

## 2021-06-27 MED ORDER — DIPHENHYDRAMINE HCL 50 MG/ML IJ SOLN
25.0000 mg | Freq: Once | INTRAMUSCULAR | Status: AC
  Administered 2021-06-27: 25 mg via INTRAVENOUS
  Filled 2021-06-27: qty 1

## 2021-06-27 MED ORDER — LABETALOL HCL 5 MG/ML IV SOLN
40.0000 mg | INTRAVENOUS | Status: DC | PRN
  Administered 2021-06-27: 40 mg via INTRAVENOUS
  Filled 2021-06-27: qty 8

## 2021-06-27 MED ORDER — HYDRALAZINE HCL 20 MG/ML IJ SOLN
10.0000 mg | INTRAMUSCULAR | Status: DC | PRN
  Filled 2021-06-27: qty 1

## 2021-06-27 MED ORDER — LABETALOL HCL 100 MG PO TABS
150.0000 mg | ORAL_TABLET | Freq: Once | ORAL | Status: AC
Start: 2021-06-27 — End: 2021-06-27
  Administered 2021-06-27: 150 mg via ORAL
  Filled 2021-06-27: qty 2

## 2021-06-27 MED ORDER — METOCLOPRAMIDE HCL 5 MG/ML IJ SOLN
10.0000 mg | Freq: Once | INTRAMUSCULAR | Status: DC
  Filled 2021-06-27: qty 2

## 2021-06-27 NOTE — MAU Note (Signed)
Per patient IV team did not come into room. Pt does not have IV access.  ? ?Provider made aware.  ? ?I called OB anesthesiologist and provider is willing to come down, but is currently started in OR case.  ? ?RN called women's house coverage Brianne for additional resources. Per Brianne RN to call operator for additional numbers to contact the IV team.   ? ?RN completed called additional number and the line was busy.  ? ? ?

## 2021-06-27 NOTE — MAU Provider Note (Signed)
?History  ?  ? ?[id number] ? ?Arrival date and time: 06/27/21 1337 ?  ? ?Chief Complaint  ?Patient presents with  ? Hypertension  ? ? ? ?HPI ?Krystal [name] is a 31 y.o. 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  ? ?Patient with very complex medical history as above ?She reports she has had an intense headache for the past 18 hours and also feels like her vision is getting blurry ?She has been very nauseous and not able to take PO or take her medicines for about the past four days ?She also reports that starting today she is having an intense chest pressure ?She also feels a pulling like painful sensation in her chest when she takes a deep breath ?Does not feel short of breath ?Also having bad reflux ?Denies vaginal bleeding, abdominal pain, leg swelling ?No recent travel ?Sister has a clotting disorder, she does not have one as far as she knows ?No leaking fluid per vagina ?No contractions ?Does feel normal fetal movement ? ? ?O/Positive/-- (02/06 1048) ? ?OB History   ? ? Gravida  ?5  ? Para  ?2  ? Term  ?0  ? Preterm  ?2  ? AB  ?2  ? Living  ?2  ?  ? ? SAB  ?2  ? IAB  ?0  ? Ectopic  ?0  ? Multiple  ?0  ? Live Births  ?2  ?   ?  ?  ? ? ?Past Medical History:  ?Diagnosis Date  ? Acute acalculous cholecystitis s/p lap cholecystectomy 05/11/2020 05/11/2020  ? Angio-edema 01/15/2021  ? Anxiety   ? Asthma   ? 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 IC (interstitial cystitis)   ? Kidney stones   ? Migraines   ? Severe uncontrolled hypertension 10/08/2017  ? UTI (urinary tract infection)   ? ? ?Past Surgical History:  ?Procedure Laterality Date  ? APPENDECTOMY    ? BIOPSY  05/10/2020  ?  Procedure: BIOPSY;  Surgeon: OArta Silence MD;  [facility]: [facility];  Service: Endoscopy;;  ? CESAREAN SECTION N/A 12/06/2017  ? Procedure: CESAREAN SECTION;  Surgeon: DSloan Leiter MD;  [facility]  Service: Obstetrics;  Laterality: N/A;  ? CHOLECYSTECTOMY N/A 05/11/2020  ? Procedure: SINGLE SITE LAPAROSCOPIC CHOLECYSTECTOMY AND LIVER BIOSY;  Surgeon: GMichael Boston MD;  Location: [facility];  Service: General;  Laterality: N/A;  ? ESOPHAGOGASTRODUODENOSCOPY (EGD) WITH PROPOFOL N/A 05/10/2020  ? Procedure: ESOPHAGOGASTRODUODENOSCOPY (EGD) WITH PROPOFOL;  Surgeon: OArta Silence MD;  Location: [facility];  Service: Endoscopy;  Laterality: N/A;  ? 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 HAND RECONSTRUCTION Right   ? INCISION AND DRAINAGE    ? NASAL SEPTUM SURGERY    ? ? ?Family History  ?Problem Relation Age of Onset  ? Hypertension Mother   ? Hypertension Father   ? ? ?Social History  ? ?Socioeconomic History  ? Marital status: Married  ?  Spouse name: Not on file  ? Number of children: Not on file  ? Years of education: Not on file  ? Highest education level: Not on file  ?Occupational History  ? Not on file  ?Tobacco Use  ?  Smoking status: Never  ? Smokeless tobacco: Never  ? Tobacco comments:  ?  She tried a cigarette and never smoked again  ?Vaping Use  ? Vaping Use: Never used  ?Substance and Sexual Activity  ? Alcohol use: Not Currently  ?  Comment: bottle of wine a week-occ  ? Drug use: No  ? Sexual activity: Yes  ?Other Topics Concern  ? Not on file  ?Social History Narrative  ? Not on file  ? ?Social Determinants of Health  ? ?Financial Resource Strain: Not on file  ?Food Insecurity: No Food Insecurity  ? Worried About Charity fundraiser in the Last Year: Never true  ? Ran Out of Food in the Last Year: Never true  ?Transportation Needs: Unmet Transportation Needs  ? Lack of Transportation (Medical): Yes  ? Lack of Transportation (Non-Medical): Yes  ?Physical  Activity: Not on file  ?Stress: Not on file  ?Social Connections: Not on file  ?Intimate Partner Violence: Not on file  ? ? ?Allergies  ?Allergen Reactions  ? Red Dye Other (See Comments)  ?  ALL CAUSE PROBLEMS ?#40 IS WORST-MIGRAINES   ? Compazine [Prochlorperazine Maleate] Other (See Comments)  ?  TWITCHING, CANT STAY STILL. Pt states she can tolerate promethazine  ? Other Other (See Comments)  ?  FANSIDAR FOR MALARIA-ASTHMA  ? Vicodin [Hydrocodone-Acetaminophen] Itching, Nausea And Vomiting and Other (See Comments)  ?  Pt states she can tolerate acetaminophen  ? Amitriptyline Hcl Other (See Comments)  ?  Angry moods  ? Duloxetine Hcl Other (See Comments)  ?  Passing out, trimble, irritable  ? Reglan [Metoclopramide] Other (See Comments)  ?  restless  ? Nifedipine Nausea And Vomiting and Other (See Comments)  ?  Discussed with patient and maternal fetal medicine, patient has intolerance but not frank allergy.  ? ? ?No current facility-administered medications on file prior to encounter.  ? ?Current Outpatient Medications on File Prior to Encounter  ?Medication Sig Dispense Refill  ? acetaminophen (TYLENOL) 500 MG tablet Take 2 tablets (1,000 mg total) by mouth every 6 (six) hours as needed. 30 tablet 0  ? amLODipine (NORVASC) 10 MG tablet Take 1 tablet (10 mg total) by mouth daily. 90 tablet 2  ? aspirin EC 81 MG tablet Take 1 tablet (81 mg total) by mouth daily. Take after 12 weeks for prevention of preeclampsia later in pregnancy 300 tablet 2  ? hydrALAZINE (APRESOLINE) 100 MG tablet Take 1 tablet (100 mg total) by mouth 3 (three) times daily. 90 tablet 1  ? labetalol (NORMODYNE) 300 MG tablet Take 0.5 tablets (150 mg total) by mouth 3 (three) times daily. 135 tablet 1  ? levothyroxine (SYNTHROID) 125 MCG tablet Take 1 tablet (125 mcg total) by mouth daily before breakfast. 45 tablet 3  ? venlafaxine XR (EFFEXOR-XR) 150 MG 24 hr capsule Take 150 mg by mouth daily.    ? albuterol (PROVENTIL HFA;VENTOLIN HFA) 108  (90 Base) MCG/ACT inhaler Inhale 2 puffs into the lungs every 6 (six) hours as needed for wheezing or shortness of breath.    ? budesonide-formoterol (SYMBICORT) 80-4.5 MCG/ACT inhaler Inhale 2 puffs into the lungs in the morning and at bedtime. Take for 1-2 weeks at a time during asthma flare. Rinse mouth after each use. 1 each 3  ? cetirizine (ZYRTEC ALLERGY) 10 MG tablet Take 1 tablet (10 mg total) by mouth 2 (two) times daily. 60 tablet 5  ? cholecalciferol (VITAMIN D3) 25 MCG (1000 UNIT) tablet Take 1,000  Units by mouth daily.    ? EPINEPHrine 0.3 mg/0.3 mL IJ SOAJ injection Inject 0.3 mg into the muscle as needed for anaphylaxis. 1 each 2  ? famotidine (PEPCID) 20 MG tablet Take 1 tablet (20 mg total) by mouth 2 (two) times daily. 60 tablet 5  ? hydrOXYzine (VISTARIL) 25 MG capsule Take 1-2 capsules (25-50 mg total) by mouth 3 (three) times daily as needed for itching. 60 capsule 1  ? ondansetron (ZOFRAN-ODT) 8 MG disintegrating tablet Take 1 tablet (8 mg total) by mouth every 8 (eight) hours as needed for nausea or vomiting. 20 tablet 2  ? pantoprazole (PROTONIX) 40 MG tablet Take 1 tablet (40 mg total) by mouth daily. 30 tablet 4  ? potassium chloride SA (KLOR-CON M) 20 MEQ tablet Take 1 tablet (20 mEq total) by mouth daily for 7 days. 7 tablet 0  ? Prenatal Multivit-Min-Fe-FA (PRE-NATAL PO) Take by mouth.    ? promethazine (PHENERGAN) 25 MG tablet Take 1 tablet (25 mg total) by mouth every 6 (six) hours as needed for nausea or vomiting. 30 tablet 2  ? scopolamine (TRANSDERM-SCOP) 1 MG/3DAYS Place 1 patch (1.5 mg total) onto the skin every 3 (three) days. 10 patch 12  ? ? ? ?ROS ?Pertinent positives and negative per HPI, all others reviewed and negative ? ?Physical Exam  ? ?BP 117/70   Pulse 65   Temp 98 ?F (36.7 ?C)   Resp 16   LMP 01/19/2021   SpO2 98%  ? ?Patient Vitals for the past 24 hrs: ? BP Temp Pulse Resp SpO2  ?06/27/21 2040 117/70 -- 65 -- 98 %  ?06/27/21 2030 126/76 -- 66 -- 99 %  ?06/27/21  2000 (!) 131/92 -- 62 -- 100 %  ?06/27/21 1947 (!) 152/89 -- 64 -- --  ?06/27/21 1940 (!) 152/89 -- (!) 58 -- --  ?06/27/21 1931 (!) 168/90 -- (!) 54 -- --  ?06/27/21 1920 (!) 164/93 -- 60 -- 100 %  ?06/27/21 1910 (!) 1

## 2021-06-27 NOTE — MAU Note (Signed)
Krystal [name] is a 31 y.o. at 50w5dhere in MAU reporting: Pt reports a headache for the last 18 hours. Pt reports trying tylenol and it did not happen. Pt reports high blood pressure. Pt reports upper abdominal pain at 0400. Pt reports her chest has been feeling tight off and on since yesterday. Pt reports when she has to take a deep breath she feels like she is being stabbed in the should.Pt reports constant N/V ? ?Pt reports the chest tightness feels like a panic attack, but she reports she is not panicking.  ? ?Onset of complaint: 18 hours ago ?Pain score: 5/10 headache, 6/10 chest tightness ?There were no vitals filed for this visit.   ?Lab orders placed from triage:  UA  ?

## 2021-06-27 NOTE — MAU Note (Addendum)
IV team outside of patient's door  ?

## 2021-06-27 NOTE — MAU Note (Signed)
Dr. Dione Plover made aware of 2 severe range blood pressures. Per Dr. Dione Plover RN to start IV and give labetalol. RN went in to start IV. Pt is a difficult stick. Pt let RN know that an u/s is usually needed to get IV access. Provider made aware and IV team consult placed.   ?

## 2021-07-02 ENCOUNTER — Inpatient Hospital Stay (HOSPITAL_COMMUNITY): Payer: Commercial Managed Care - HMO

## 2021-07-02 ENCOUNTER — Ambulatory Visit (HOSPITAL_BASED_OUTPATIENT_CLINIC_OR_DEPARTMENT_OTHER): Payer: Commercial Managed Care - HMO

## 2021-07-02 ENCOUNTER — Ambulatory Visit (HOSPITAL_BASED_OUTPATIENT_CLINIC_OR_DEPARTMENT_OTHER): Payer: Commercial Managed Care - HMO | Admitting: Obstetrics

## 2021-07-02 ENCOUNTER — Inpatient Hospital Stay (HOSPITAL_COMMUNITY)
Admission: AD | Admit: 2021-07-02 | Discharge: 2021-07-24 | DRG: 784 | Disposition: A | Discharge status: Home / Self Care | Payer: Commercial Managed Care - HMO | Attending: Obstetrics and Gynecology | Admitting: Obstetrics and Gynecology

## 2021-07-02 ENCOUNTER — Encounter: Payer: Self-pay | Admitting: *Deleted

## 2021-07-02 ENCOUNTER — Ambulatory Visit: Payer: Commercial Managed Care - HMO | Admitting: *Deleted

## 2021-07-02 ENCOUNTER — Other Ambulatory Visit: Payer: Self-pay | Admitting: Obstetrics and Gynecology

## 2021-07-02 VITALS — BP 161/110 | HR 75

## 2021-07-02 VITALS — BP 180/101 | HR 69

## 2021-07-02 DIAGNOSIS — O10912 Unspecified pre-existing hypertension complicating pregnancy, second trimester: Secondary | ICD-10-CM | POA: Insufficient documentation

## 2021-07-02 DIAGNOSIS — O35BXX0 Maternal care for other (suspected) fetal abnormality and damage, fetal cardiac anomalies, not applicable or unspecified: Secondary | ICD-10-CM | POA: Diagnosis present

## 2021-07-02 DIAGNOSIS — F410 Panic disorder [episodic paroxysmal anxiety] without agoraphobia: Secondary | ICD-10-CM | POA: Diagnosis present

## 2021-07-02 DIAGNOSIS — O09213 Supervision of pregnancy with history of pre-term labor, third trimester: Secondary | ICD-10-CM

## 2021-07-02 DIAGNOSIS — R519 Headache, unspecified: Secondary | ICD-10-CM

## 2021-07-02 DIAGNOSIS — Z3A25 25 weeks gestation of pregnancy: Secondary | ICD-10-CM | POA: Diagnosis not present

## 2021-07-02 DIAGNOSIS — O99284 Endocrine, nutritional and metabolic diseases complicating childbirth: Secondary | ICD-10-CM | POA: Diagnosis present

## 2021-07-02 DIAGNOSIS — O34219 Maternal care for unspecified type scar from previous cesarean delivery: Secondary | ICD-10-CM | POA: Diagnosis not present

## 2021-07-02 DIAGNOSIS — O10012 Pre-existing essential hypertension complicating pregnancy, second trimester: Secondary | ICD-10-CM

## 2021-07-02 DIAGNOSIS — J45909 Unspecified asthma, uncomplicated: Secondary | ICD-10-CM | POA: Diagnosis present

## 2021-07-02 DIAGNOSIS — O10919 Unspecified pre-existing hypertension complicating pregnancy, unspecified trimester: Secondary | ICD-10-CM

## 2021-07-02 DIAGNOSIS — Z302 Encounter for sterilization: Secondary | ICD-10-CM | POA: Diagnosis not present

## 2021-07-02 DIAGNOSIS — Z91041 Radiographic dye allergy status: Secondary | ICD-10-CM | POA: Diagnosis not present

## 2021-07-02 DIAGNOSIS — O365920 Maternal care for other known or suspected poor fetal growth, second trimester, not applicable or unspecified: Secondary | ICD-10-CM

## 2021-07-02 DIAGNOSIS — F4310 Post-traumatic stress disorder, unspecified: Secondary | ICD-10-CM | POA: Diagnosis present

## 2021-07-02 DIAGNOSIS — O365990 Maternal care for other known or suspected poor fetal growth, unspecified trimester, not applicable or unspecified: Secondary | ICD-10-CM | POA: Diagnosis present

## 2021-07-02 DIAGNOSIS — I10 Essential (primary) hypertension: Secondary | ICD-10-CM | POA: Diagnosis present

## 2021-07-02 DIAGNOSIS — O9902 Anemia complicating childbirth: Secondary | ICD-10-CM | POA: Diagnosis present

## 2021-07-02 DIAGNOSIS — Z362 Encounter for other antenatal screening follow-up: Secondary | ICD-10-CM | POA: Insufficient documentation

## 2021-07-02 DIAGNOSIS — O358XX0 Maternal care for other (suspected) fetal abnormality and damage, not applicable or unspecified: Secondary | ICD-10-CM

## 2021-07-02 DIAGNOSIS — O34211 Maternal care for low transverse scar from previous cesarean delivery: Secondary | ICD-10-CM | POA: Diagnosis present

## 2021-07-02 DIAGNOSIS — Z9049 Acquired absence of other specified parts of digestive tract: Secondary | ICD-10-CM | POA: Diagnosis not present

## 2021-07-02 DIAGNOSIS — G43901 Migraine, unspecified, not intractable, with status migrainosus: Secondary | ICD-10-CM | POA: Diagnosis not present

## 2021-07-02 DIAGNOSIS — O99344 Other mental disorders complicating childbirth: Secondary | ICD-10-CM | POA: Diagnosis present

## 2021-07-02 DIAGNOSIS — F419 Anxiety disorder, unspecified: Secondary | ICD-10-CM | POA: Diagnosis present

## 2021-07-02 DIAGNOSIS — D509 Iron deficiency anemia, unspecified: Secondary | ICD-10-CM | POA: Diagnosis present

## 2021-07-02 DIAGNOSIS — Z3A23 23 weeks gestation of pregnancy: Secondary | ICD-10-CM

## 2021-07-02 DIAGNOSIS — Z888 Allergy status to other drugs, medicaments and biological substances status: Secondary | ICD-10-CM

## 2021-07-02 DIAGNOSIS — R112 Nausea with vomiting, unspecified: Secondary | ICD-10-CM

## 2021-07-02 DIAGNOSIS — O321XX0 Maternal care for breech presentation, not applicable or unspecified: Secondary | ICD-10-CM | POA: Diagnosis present

## 2021-07-02 DIAGNOSIS — O09299 Supervision of pregnancy with other poor reproductive or obstetric history, unspecified trimester: Secondary | ICD-10-CM | POA: Diagnosis not present

## 2021-07-02 DIAGNOSIS — O1002 Pre-existing essential hypertension complicating childbirth: Principal | ICD-10-CM | POA: Diagnosis present

## 2021-07-02 DIAGNOSIS — E039 Hypothyroidism, unspecified: Secondary | ICD-10-CM

## 2021-07-02 DIAGNOSIS — Z87442 Personal history of urinary calculi: Secondary | ICD-10-CM

## 2021-07-02 DIAGNOSIS — G43101 Migraine with aura, not intractable, with status migrainosus: Secondary | ICD-10-CM | POA: Diagnosis present

## 2021-07-02 DIAGNOSIS — O0990 Supervision of high risk pregnancy, unspecified, unspecified trimester: Secondary | ICD-10-CM

## 2021-07-02 DIAGNOSIS — O218 Other vomiting complicating pregnancy: Secondary | ICD-10-CM | POA: Diagnosis not present

## 2021-07-02 DIAGNOSIS — O09293 Supervision of pregnancy with other poor reproductive or obstetric history, third trimester: Secondary | ICD-10-CM

## 2021-07-02 DIAGNOSIS — H5702 Anisocoria: Secondary | ICD-10-CM | POA: Diagnosis present

## 2021-07-02 DIAGNOSIS — O1424 HELLP syndrome, complicating childbirth: Secondary | ICD-10-CM | POA: Diagnosis not present

## 2021-07-02 DIAGNOSIS — F988 Other specified behavioral and emotional disorders with onset usually occurring in childhood and adolescence: Secondary | ICD-10-CM | POA: Diagnosis present

## 2021-07-02 DIAGNOSIS — O365930 Maternal care for other known or suspected poor fetal growth, third trimester, not applicable or unspecified: Secondary | ICD-10-CM | POA: Diagnosis not present

## 2021-07-02 DIAGNOSIS — G43709 Chronic migraine without aura, not intractable, without status migrainosus: Secondary | ICD-10-CM

## 2021-07-02 DIAGNOSIS — Z98891 History of uterine scar from previous surgery: Secondary | ICD-10-CM

## 2021-07-02 DIAGNOSIS — O99280 Endocrine, nutritional and metabolic diseases complicating pregnancy, unspecified trimester: Secondary | ICD-10-CM

## 2021-07-02 DIAGNOSIS — Z3A26 26 weeks gestation of pregnancy: Secondary | ICD-10-CM | POA: Diagnosis not present

## 2021-07-02 DIAGNOSIS — F332 Major depressive disorder, recurrent severe without psychotic features: Secondary | ICD-10-CM | POA: Diagnosis present

## 2021-07-02 DIAGNOSIS — O9952 Diseases of the respiratory system complicating childbirth: Secondary | ICD-10-CM | POA: Diagnosis present

## 2021-07-02 DIAGNOSIS — G43511 Persistent migraine aura without cerebral infarction, intractable, with status migrainosus: Secondary | ICD-10-CM | POA: Diagnosis not present

## 2021-07-02 DIAGNOSIS — O26892 Other specified pregnancy related conditions, second trimester: Secondary | ICD-10-CM | POA: Diagnosis not present

## 2021-07-02 DIAGNOSIS — O1422 HELLP syndrome (HELLP), second trimester: Secondary | ICD-10-CM | POA: Diagnosis not present

## 2021-07-02 DIAGNOSIS — E876 Hypokalemia: Secondary | ICD-10-CM | POA: Diagnosis present

## 2021-07-02 DIAGNOSIS — G44221 Chronic tension-type headache, intractable: Secondary | ICD-10-CM | POA: Diagnosis not present

## 2021-07-02 DIAGNOSIS — O99354 Diseases of the nervous system complicating childbirth: Secondary | ICD-10-CM | POA: Diagnosis present

## 2021-07-02 DIAGNOSIS — I1A0 Resistant hypertension: Secondary | ICD-10-CM | POA: Diagnosis present

## 2021-07-02 DIAGNOSIS — Q213 Tetralogy of Fallot: Secondary | ICD-10-CM | POA: Diagnosis not present

## 2021-07-02 DIAGNOSIS — O09899 Supervision of other high risk pregnancies, unspecified trimester: Secondary | ICD-10-CM

## 2021-07-02 DIAGNOSIS — O212 Late vomiting of pregnancy: Secondary | ICD-10-CM | POA: Diagnosis present

## 2021-07-02 HISTORY — DX: Necrotizing fasciitis: M72.6

## 2021-07-02 LAB — COMPREHENSIVE METABOLIC PANEL
ALT: 18 U/L (ref 0–44)
AST: 20 U/L (ref 15–41)
Albumin: 3.3 g/dL — ABNORMAL LOW (ref 3.5–5.0)
Alkaline Phosphatase: 51 U/L (ref 38–126)
Anion gap: 7 (ref 5–15)
BUN: 13 mg/dL (ref 6–20)
CO2: 24 mmol/L (ref 22–32)
Calcium: 8.9 mg/dL (ref 8.9–10.3)
Chloride: 106 mmol/L (ref 98–111)
Creatinine, Ser: 0.68 mg/dL (ref 0.44–1.00)
GFR, Estimated: >60 mL/min (ref >60)
Glucose, Bld: 79 mg/dL (ref 70–99)
Potassium: 3.3 mmol/L — ABNORMAL LOW (ref 3.5–5.1)
Sodium: 137 mmol/L (ref 135–145)
Total Bilirubin: 0.8 mg/dL (ref 0.3–1.2)
Total Protein: 6 g/dL — ABNORMAL LOW (ref 6.5–8.1)

## 2021-07-02 LAB — CBC
HCT: 33.6 % — ABNORMAL LOW (ref 36.0–46.0)
Hemoglobin: 12 g/dL (ref 12.0–15.0)
MCH: 33 pg (ref 26.0–34.0)
MCHC: 35.7 g/dL (ref 30.0–36.0)
MCV: 92.3 fL (ref 80.0–100.0)
Platelets: 202 10*3/uL (ref 150–400)
RBC: 3.64 MIL/uL — ABNORMAL LOW (ref 3.87–5.11)
RDW: 11.9 % (ref 11.5–15.5)
WBC: 8.7 10*3/uL (ref 4.0–10.5)
nRBC: 0 % (ref 0.0–0.2)

## 2021-07-02 LAB — TYPE AND SCREEN
ABO/RH(D): O POS
Antibody Screen: NEGATIVE

## 2021-07-02 LAB — PROTEIN / CREATININE RATIO, URINE
Creatinine, Urine: 231.92 mg/dL
Protein Creatinine Ratio: 0.11 mg/mg{Cre} (ref 0.00–0.15)
Total Protein, Urine: 25 mg/dL

## 2021-07-02 LAB — GLUCOSE, CAPILLARY: Glucose-Capillary: 78 mg/dL (ref 70–99)

## 2021-07-02 MED ORDER — ASPIRIN EC 81 MG PO TBEC
81.0000 mg | DELAYED_RELEASE_TABLET | Freq: Every day | ORAL | Status: DC
  Administered 2021-07-02 – 2021-07-03 (×2): 81 mg via ORAL
  Filled 2021-07-02 (×2): qty 1

## 2021-07-02 MED ORDER — LEVOTHYROXINE SODIUM 25 MCG PO TABS
125.0000 ug | ORAL_TABLET | Freq: Every day | ORAL | Status: DC
  Administered 2021-07-03 – 2021-07-16 (×14): 125 ug via ORAL
  Filled 2021-07-02 (×15): qty 5

## 2021-07-02 MED ORDER — DOCUSATE SODIUM 100 MG PO CAPS: 100.0000 mg | ORAL_CAPSULE | Freq: Every day | ORAL | Status: DC

## 2021-07-02 MED ORDER — PANTOPRAZOLE SODIUM 40 MG PO TBEC
40.0000 mg | DELAYED_RELEASE_TABLET | Freq: Every day | ORAL | Status: DC
  Administered 2021-07-02 – 2021-07-03 (×2): 40 mg via ORAL
  Filled 2021-07-02 (×2): qty 1

## 2021-07-02 MED ORDER — ONDANSETRON 4 MG PO TBDP
8.0000 mg | ORAL_TABLET | Freq: Three times a day (TID) | ORAL | Status: DC | PRN
  Administered 2021-07-02: 8 mg via ORAL
  Filled 2021-07-02: qty 2

## 2021-07-02 MED ORDER — PROMETHAZINE HCL 25 MG PO TABS
25.0000 mg | ORAL_TABLET | Freq: Four times a day (QID) | ORAL | Status: DC | PRN
  Administered 2021-07-02 – 2021-07-03 (×3): 25 mg via ORAL
  Filled 2021-07-02 (×4): qty 1

## 2021-07-02 MED ORDER — LABETALOL HCL 5 MG/ML IV SOLN
80.0000 mg | INTRAVENOUS | Status: DC | PRN
  Administered 2021-07-21: 80 mg via INTRAVENOUS
  Filled 2021-07-02: qty 16

## 2021-07-02 MED ORDER — FAMOTIDINE 20 MG PO TABS
20.0000 mg | ORAL_TABLET | Freq: Two times a day (BID) | ORAL | Status: DC
  Administered 2021-07-02 – 2021-07-05 (×4): 20 mg via ORAL
  Filled 2021-07-02 (×5): qty 1

## 2021-07-02 MED ORDER — LABETALOL HCL 5 MG/ML IV SOLN
40.0000 mg | INTRAVENOUS | Status: DC | PRN
  Administered 2021-07-03 – 2021-07-21 (×4): 40 mg via INTRAVENOUS
  Filled 2021-07-02 (×6): qty 8

## 2021-07-02 MED ORDER — VENLAFAXINE HCL ER 150 MG PO CP24
150.0000 mg | ORAL_CAPSULE | Freq: Every day | ORAL | Status: DC
  Administered 2021-07-02 – 2021-07-04 (×2): 150 mg via ORAL
  Filled 2021-07-02 (×6): qty 1

## 2021-07-02 MED ORDER — HYDROXYZINE HCL 50 MG PO TABS
25.0000 mg | ORAL_TABLET | Freq: Three times a day (TID) | ORAL | Status: DC | PRN
  Administered 2021-07-02: 50 mg via ORAL
  Filled 2021-07-02: qty 1

## 2021-07-02 MED ORDER — ZOLPIDEM TARTRATE 5 MG PO TABS
5.0000 mg | ORAL_TABLET | Freq: Every evening | ORAL | Status: DC | PRN
  Administered 2021-07-05 – 2021-07-11 (×6): 5 mg via ORAL
  Filled 2021-07-02 (×6): qty 1

## 2021-07-02 MED ORDER — SCOPOLAMINE 1 MG/3DAYS TD PT72
1.0000 | MEDICATED_PATCH | TRANSDERMAL | Status: DC
  Administered 2021-07-02 – 2021-07-05 (×2): 1.5 mg via TRANSDERMAL
  Filled 2021-07-02 (×3): qty 1

## 2021-07-02 MED ORDER — LABETALOL HCL 5 MG/ML IV SOLN
20.0000 mg | INTRAVENOUS | Status: DC | PRN
  Administered 2021-07-03 – 2021-07-21 (×7): 20 mg via INTRAVENOUS
  Filled 2021-07-02 (×9): qty 4

## 2021-07-02 MED ORDER — ONDANSETRON HCL 4 MG/2ML IJ SOLN
INTRAMUSCULAR | Status: AC
  Filled 2021-07-02: qty 2

## 2021-07-02 MED ORDER — ALBUTEROL SULFATE HFA 108 (90 BASE) MCG/ACT IN AERS: 2.0000 | INHALATION_SPRAY | Freq: Four times a day (QID) | RESPIRATORY_TRACT | Status: DC | PRN

## 2021-07-02 MED ORDER — LORATADINE 10 MG PO TABS
10.0000 mg | ORAL_TABLET | Freq: Every day | ORAL | Status: DC
Start: 2021-07-02 — End: 2021-07-16
  Administered 2021-07-02 – 2021-07-16 (×12): 10 mg via ORAL
  Filled 2021-07-02 (×13): qty 1

## 2021-07-02 MED ORDER — LORAZEPAM 2 MG/ML IJ SOLN
INTRAMUSCULAR | Status: AC
  Filled 2021-07-02: qty 1

## 2021-07-02 MED ORDER — HYDRALAZINE HCL 50 MG PO TABS
100.0000 mg | ORAL_TABLET | Freq: Three times a day (TID) | ORAL | Status: DC
  Administered 2021-07-02 – 2021-07-09 (×20): 100 mg via ORAL
  Filled 2021-07-02 (×2): qty 2
  Filled 2021-07-02: qty 1
  Filled 2021-07-02 (×14): qty 2
  Filled 2021-07-02: qty 1
  Filled 2021-07-02 (×5): qty 2

## 2021-07-02 MED ORDER — HYDROMORPHONE HCL 1 MG/ML IJ SOLN
1.0000 mg | Freq: Once | INTRAMUSCULAR | Status: AC
  Administered 2021-07-02: 1 mg via INTRAVENOUS
  Filled 2021-07-02: qty 1

## 2021-07-02 MED ORDER — AMLODIPINE BESYLATE 5 MG PO TABS
10.0000 mg | ORAL_TABLET | Freq: Every day | ORAL | Status: DC
Start: 2021-07-02 — End: 2021-07-16
  Administered 2021-07-03 – 2021-07-16 (×14): 10 mg via ORAL
  Filled 2021-07-02 (×15): qty 2

## 2021-07-02 MED ORDER — ACETAMINOPHEN 325 MG PO TABS
650.0000 mg | ORAL_TABLET | ORAL | Status: DC | PRN
  Administered 2021-07-02 – 2021-07-13 (×19): 650 mg via ORAL
  Filled 2021-07-02 (×20): qty 2

## 2021-07-02 MED ORDER — HYDRALAZINE HCL 20 MG/ML IJ SOLN
10.0000 mg | INTRAMUSCULAR | Status: DC | PRN
  Administered 2021-07-21 (×2): 10 mg via INTRAVENOUS
  Filled 2021-07-02 (×2): qty 1

## 2021-07-02 MED ORDER — PRENATAL MULTIVITAMIN CH
1.0000 | ORAL_TABLET | Freq: Every day | ORAL | Status: DC
  Administered 2021-07-02 – 2021-07-14 (×7): 1 via ORAL
  Filled 2021-07-02 (×8): qty 1

## 2021-07-02 MED ORDER — CALCIUM CARBONATE ANTACID 500 MG PO CHEW
2.0000 | CHEWABLE_TABLET | ORAL | Status: DC | PRN
  Administered 2021-07-08: 400 mg via ORAL
  Filled 2021-07-02: qty 2

## 2021-07-02 MED ORDER — HYDROXYZINE PAMOATE 25 MG PO CAPS
25.0000 mg | ORAL_CAPSULE | Freq: Three times a day (TID) | ORAL | Status: DC | PRN
  Filled 2021-07-02: qty 2

## 2021-07-02 MED ORDER — LORAZEPAM 2 MG/ML IJ SOLN
1.0000 mg | Freq: Once | INTRAMUSCULAR | Status: AC
  Administered 2021-07-02: 1 mg via INTRAVENOUS

## 2021-07-02 MED ORDER — LABETALOL HCL 200 MG PO TABS
200.0000 mg | ORAL_TABLET | Freq: Three times a day (TID) | ORAL | Status: DC
Start: 2021-07-02 — End: 2021-07-03
  Administered 2021-07-02 – 2021-07-03 (×3): 200 mg via ORAL
  Filled 2021-07-02 (×3): qty 1

## 2021-07-02 MED ORDER — HYDROXYZINE HCL 50 MG PO TABS: 50.0000 mg | ORAL_TABLET | Freq: Three times a day (TID) | ORAL | Status: DC | PRN

## 2021-07-02 NOTE — Hospital Course (Signed)
Right unilateral pupil dialatation  ?Diplopia  ?Dizziness  ?Bilateral decreased periferal vision right worse than left ? ?

## 2021-07-02 NOTE — Plan of Care (Signed)
?  Problem: Education: ?Goal: Knowledge of disease or condition will improve ?Outcome: Progressing ?  ?Problem: Education: ?Goal: Knowledge of the prescribed therapeutic regimen will improve ?Outcome: Progressing ?  ?Problem: Education: ?Goal: Individualized Educational Video(s) ?Outcome: Progressing ?  ?Problem: Clinical Measurements: ?Goal: Complications related to the disease process, condition or treatment will be avoided or minimized ?Outcome: Progressing ?  ?

## 2021-07-02 NOTE — Consult Note (Addendum)
Neurology Attending Attestation ?  ?I examined the patient and discussed the plan with resident Dr. Leonides Cave.  The note below reflects my findings and recommendations.  I personally reviewed all CNS imaging.  This is a 31 year old woman with a past medical history significant for preeclampsia and HELLP in prior pregnancies, severe uncontrolled hypertension, now [redacted] weeks pregnant and presenting with acute onset of headache and anisocoria.  Her headache began yesterday approximately in severity over the course of the night peaking out of 10 out of 10.  It is currently a 6 out of 10.  Headache was not thunderclap, not maximal at onset.  She endorses associated blurred vision with halos and mild dizziness, intermittent diplopia.  She has difficulty seeing out of the lateral periphery of each eye.  She is very nauseated.  She has significant anisicoria R pupil 80m reactive L pupil 250mreactive. She does have a history of migraines but this is much more severe than typical.   ? ?Her head CT showed no acute intracranial process.  TNK was not considered due to no objective deficits and patient presentation outside the window.  Given her complicated history including preeclampsia and HELLP with prior pregnancies (labs currently pending for this admission) recommend further work-up as follows: ? ?- MRI brain wo contrast r/o ischemic infarct (less likely given nonfocal) or PRES ?- MRV wo contrast r/o venous sinus thrombosis ?- MRA wo contrast r/o aneurysm or other vascular malformation ? ?Given that her headache has migrainous features, she has a history of migraines, severity was not maximal at onset, and head CT next day was unremarkable I have v low suspicion for [facility] do not feel LP is currently indicated. ? ?She wears a scopolamine patch and also uses albuterol, which could cause anisicoria but this would be a diagnosis of exclusion. ? ?We will continue to follow. ? ?CoSu MonksMD ?[facility] ?[phone] ?If 7pm- 7am, please page neurology on call as listed in [place]? ? ?NEUROLOGY CONSULTATION NOTE  ? ?Date of service: Jul 02, 2021 ?Patient Name: Krystal [name] ?MRN:  [id number]:  7/March 09, 1992Reason for consult: "Anisocoria" ?Requesting physician: OBP ?_ _ _   _ __   _ __ _ _  __ __   _ __   __ _ ? ?History of Present Illness  ? ?Krystal [name] is a [age].o. female with PMH significant for  has a past medical history of Acute acalculous cholecystitis s/p lap cholecystectomy 05/11/2020 (05/11/2020), Angio-edema (01/15/2021), Anxiety, Asthma, HELLP (hemolytic anemia/elev liver enzymes/low platelets in pregnancy) (12/08/2017), IC (interstitial cystitis), Kidney stones, Migraines, Severe uncontrolled hypertension (10/08/2017), and UTI (urinary tract infection). who presents with acute onset headache and anisocoria. ? ?Patient states that yesterday she developed a severe headache the progressively worsened over the course of the day with 10/10 pain yesterday evening. She endorsed associated blurred vision with halos and dizziness. She went to the doctor today where she was found to have significantly elevated BP and sent to the ED. At some point today she developed a right sided dilated pupil. Otherwise she endorsed significant central headache that radiated to the back, diplopia, nausea, emesis and neck stiffness. She denies recent fever, chills, dysuria, abdominal pain, or any other focal neurologic deficits.   ? ?Stroke workup this admission: ? ?CTH: negative for ICH. No other acute abnormalities noted.  ? ?Lipid Panel: No results found for: LDLCALC ?HgbA1c:  ?Lab Results  ?Component Value Date  ? HGBA1C 5.1 04/01/2021  ? ?  ?  ROS  ? ?Per HPI; all other systems reviewed and are negative ? ?Past History  ? ?Past Medical History:  ?Diagnosis Date  ?? Acute acalculous cholecystitis s/p lap cholecystectomy 05/11/2020 05/11/2020  ?? Angio-edema 01/15/2021  ?? Anxiety   ?? Asthma    ?? 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 IC (interstitial cystitis)   ?? Kidney stones   ?? Migraines   ?? Severe uncontrolled hypertension 10/08/2017  ?? UTI (urinary tract infection)   ? ?Past Surgical History:  ?Procedure Laterality Date  ?? APPENDECTOMY    ?? BIOPSY  05/10/2020  ? Procedure: BIOPSY;  Surgeon: Arta Silence, MD;  Location: [facility];  Service: Endoscopy;;  ?? CESAREAN SECTION N/A 12/06/2017  ? Procedure: CESAREAN SECTION;  Surgeon: Sloan Leiter, MD;  Location: [place];  Service: Obstetrics;  Laterality: N/A;  ?? CHOLECYSTECTOMY N/A 05/11/2020  ? Procedure: SINGLE SITE LAPAROSCOPIC CHOLECYSTECTOMY AND LIVER BIOSY;  Surgeon: Michael Boston, MD;  Location: [facility];  Service: General;  Laterality: N/A;  ?? ESOPHAGOGASTRODUODENOSCOPY (EGD) WITH PROPOFOL N/A 05/10/2020  ? Procedure: ESOPHAGOGASTRODUODENOSCOPY (EGD) WITH PROPOFOL;  Surgeon: Arta Silence, MD;  [facility];  Service: Endoscopy;  Laterality: N/A;  ?? 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 HAND RECONSTRUCTION Right   ?? INCISION AND DRAINAGE    ?? NASAL SEPTUM SURGERY    ? ?Family History  ?Problem Relation Age of Onset  ?? Hypertension Mother   ?? Hypertension Father   ? ?Social History  ? ?Socioeconomic History  ?? Marital status: Married  ?  Spouse name: Not on file  ?? Number of children: Not on file  ?? Years of education: Not on file  ?? Highest education level: Not on file  ?Occupational History  ?? Not on file  ?Tobacco Use  ?? Smoking status: Never  ?? Smokeless tobacco: Never  ?? Tobacco comments:  ?  She tried a cigarette and never smoked again  ?Vaping Use  ?? Vaping Use: Never used  ?Substance and Sexual Activity  ?? Alcohol use: Not Currently  ?  Comment: bottle of wine a week-occ  ?? Drug use: No  ?? Sexual activity: Yes  ?Other Topics Concern  ?? Not on file  ?Social History Narrative  ?? Not on file  ? ?Social Determinants of  Health  ? ?Financial Resource Strain: Not on file  ?Food Insecurity: No Food Insecurity  ?? Worried About Charity fundraiser in the Last Year: Never true  ?? Ran Out of Food in the Last Year: Never true  ?Transportation Needs: Unmet Transportation Needs  ?? Lack of Transportation (Medical): Yes  ?? Lack of Transportation (Non-Medical): Yes  ?Physical Activity: Not on file  ?Stress: Not on file  ?Social Connections: Not on file  ? ?Allergies  ?Allergen Reactions  ?? Red Dye Other (See Comments)  ?  ALL CAUSE PROBLEMS ?#40 IS WORST-MIGRAINES   ?? Compazine [Prochlorperazine Maleate] Other (See Comments)  ?  TWITCHING, CANT STAY STILL. Pt states she can tolerate promethazine  ?? Other Other (See Comments)  ?  FANSIDAR FOR MALARIA-ASTHMA  ?? Vicodin [Hydrocodone-Acetaminophen] Itching, Nausea And Vomiting and Other (See Comments)  ?  Pt states she can tolerate acetaminophen  ?? Amitriptyline Hcl Other (See Comments)  ?  Angry moods  ?? Duloxetine Hcl Other (See Comments)  ?  Passing out, trimble, irritable  ?? Reglan [Metoclopramide] Other (See Comments)  ?  restless  ?? Nifedipine  Nausea And Vomiting and Other (See Comments)  ?  Discussed with patient and maternal fetal medicine, patient has intolerance but not frank allergy.  ? ? ?Medications  ? ?Medications Prior to Admission  ?Medication Sig Dispense Refill Last Dose  ?? acetaminophen (TYLENOL) 500 MG tablet Take 2 tablets (1,000 mg total) by mouth every 6 (six) hours as needed. 30 tablet 0   ?? albuterol (PROVENTIL HFA;VENTOLIN HFA) 108 (90 Base) MCG/ACT inhaler Inhale 2 puffs into the lungs every 6 (six) hours as needed for wheezing or shortness of breath.     ?? amLODipine (NORVASC) 10 MG tablet Take 1 tablet (10 mg total) by mouth daily. 90 tablet 2   ?? aspirin EC 81 MG tablet Take 1 tablet (81 mg total) by mouth daily. Take after 12 weeks for prevention of preeclampsia later in pregnancy 300 tablet 2   ?? budesonide-formoterol (SYMBICORT) 80-4.5 MCG/ACT  inhaler Inhale 2 puffs into the lungs in the morning and at bedtime. Take for 1-2 weeks at a time during asthma flare. Rinse mouth after each use. 1 each 3   ?? cetirizine (ZYRTEC ALLERGY) 10 MG tablet Take 1 tablet (10 mg total) by mouth 2

## 2021-07-02 NOTE — H&P (Signed)
[facility]ulty [facility] ?Antenatal History and Physical ? ?Krystal [name] [place] TOI:[id number] DOB: 1990-07-08 DOA: 07/02/2021 ? ?Chief Complaint: Headache ? ?HPI: Krystal [name] is a 31 y.o. 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  She reports having a headache over the past couple of days that is 10 out of 10.  She is also had some blurred vision with halos and dizziness.  She went home after her MFM appointment, then came here.  Here she reports headache in the posterior part of her head, diplopia, blurred vision.  She does also have some history of neck stiffness.  She does wear scopolamine patch ? ?She reports good fetal movement. ? ?Review of Systems:  ? ?Pt denies any fevers, chills, vomiting, dysuria, abdominal pain, weakness, decreased sensation.  Review of systems are otherwise negative ? 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 ? ?Past Medical History: ?Past Medical History:  ?Diagnosis Date  ? Acute acalculous cholecystitis s/p lap cholecystectomy 05/11/2020 05/11/2020  ? Angio-edema 01/15/2021  ? Anxiety   ? Asthma   ? 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 IC (interstitial cystitis)   ? Kidney stones   ? Migraines   ? Severe uncontrolled hypertension 10/08/2017  ? UTI (urinary tract infection)   ? ? ?Past Surgical History: ?Past Surgical History:  ?Procedure Laterality Date  ? APPENDECTOMY    ? BIOPSY  05/10/2020  ? Procedure: BIOPSY;  Surgeon: OArta Silence MD;  Location: [facility];  Service: Endoscopy;;  ? CESAREAN  SECTION N/A 12/06/2017  ? Procedure: CESAREAN SECTION;  Surgeon: DSloan Leiter MD;  [place]  Service: Obstetrics;  Laterality: N/A;  ? CHOLECYSTECTOMY N/A 05/11/2020  ? Procedure: SINGLE SITE LAPAROSCOPIC CHOLECYSTECTOMY AND LIVER BIOSY;  Surgeon: GMichael Boston MD;  Location: [facility];  Service: General;  Laterality: N/A;  ? ESOPHAGOGASTRODUODENOSCOPY (EGD) WITH PROPOFOL N/A 05/10/2020  ? Procedure: ESOPHAGOGASTRODUODENOSCOPY (EGD) WITH PROPOFOL;  Surgeon: OArta Silence MD;  Location: [facility];  Service: Endoscopy;  Laterality: N/A;  ? 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 HAND RECONSTRUCTION Right   ? INCISION AND DRAINAGE    ? NASAL SEPTUM SURGERY    ? ? ?Obstetrical History: ?OB History   ? ? Gravida  ?5  ? Para  ?2  ? Term  ?0  ? Preterm  ?2  ? AB  ?2  ? Living  ?2  ?  ? ? SAB  ?2  ? IAB  ?0  ? Ectopic  ?0  ? Multiple  ?0  ? Live Births  ?2  ?   ?  ?  ? ? ?Gynecological History: ?OB History   ? ? Gravida  ?5  ? Para  ?2  ? Term  ?0  ? Preterm  ?2  ? AB  ?2  ? Living  ?2  ?  ? ? SAB  ?2  ? IAB  ?0  ? Ectopic  ?0  ? Multiple  ?0  ? Live Births  ?2  ?   ?  ?  ? ? ?Social History: ?Social History  ? ?Socioeconomic  History  ? Marital status: Married  ?  Spouse name: Not on file  ? Number of children: Not on file  ? Years of education: Not on file  ? Highest education level: Not on file  ?Occupational History  ? Not on file  ?Tobacco Use  ? Smoking status: Never  ? Smokeless tobacco: Never  ? Tobacco comments:  ?  She tried a cigarette and never smoked again  ?Vaping Use  ? Vaping Use: Never used  ?Substance and Sexual Activity  ? Alcohol use: Not Currently  ?  Comment: bottle of wine a week-occ  ? Drug use: No  ? Sexual activity: Yes  ?Other Topics Concern  ? Not on file  ?Social History Narrative  ? Not on file  ? ?Social Determinants of Health  ? ?Financial Resource Strain: Not on file  ?Food Insecurity: No Food Insecurity  ? Worried About Charity fundraiser in the  Last Year: Never true  ? Ran Out of Food in the Last Year: Never true  ?Transportation Needs: Unmet Transportation Needs  ? Lack of Transportation (Medical): Yes  ? Lack of Transportation (Non-Medical): Yes  ?Physical Activity: Not on file  ?Stress: Not on file  ?Social Connections: Not on file  ? ? ?Family History: ?Family History  ?Problem Relation Age of Onset  ? Hypertension Mother   ? Hypertension Father   ? ? ?Allergies: ?Allergies  ?Allergen Reactions  ? Red Dye Other (See Comments)  ?  ALL CAUSE PROBLEMS ?#40 IS WORST-MIGRAINES   ? Compazine [Prochlorperazine Maleate] Other (See Comments)  ?  TWITCHING, CANT STAY STILL. Pt states she can tolerate promethazine  ? Other Other (See Comments)  ?  FANSIDAR FOR MALARIA-ASTHMA  ? Vicodin [Hydrocodone-Acetaminophen] Itching, Nausea And Vomiting and Other (See Comments)  ?  Pt states she can tolerate acetaminophen  ? Amitriptyline Hcl Other (See Comments)  ?  Angry moods  ? Duloxetine Hcl Other (See Comments)  ?  Passing out, trimble, irritable  ? Reglan [Metoclopramide] Other (See Comments)  ?  restless  ? Nifedipine Nausea And Vomiting and Other (See Comments)  ?  Discussed with patient and maternal fetal medicine, patient has intolerance but not frank allergy.  ? ? ?Medications Prior to Admission  ?Medication Sig Dispense Refill Last Dose  ? acetaminophen (TYLENOL) 500 MG tablet Take 2 tablets (1,000 mg total) by mouth every 6 (six) hours as needed. 30 tablet 0   ? albuterol (PROVENTIL HFA;VENTOLIN HFA) 108 (90 Base) MCG/ACT inhaler Inhale 2 puffs into the lungs every 6 (six) hours as needed for wheezing or shortness of breath.     ? amLODipine (NORVASC) 10 MG tablet Take 1 tablet (10 mg total) by mouth daily. 90 tablet 2   ? aspirin EC 81 MG tablet Take 1 tablet (81 mg total) by mouth daily. Take after 12 weeks for prevention of preeclampsia later in pregnancy 300 tablet 2   ? budesonide-formoterol (SYMBICORT) 80-4.5 MCG/ACT inhaler Inhale 2 puffs into the lungs  in the morning and at bedtime. Take for 1-2 weeks at a time during asthma flare. Rinse mouth after each use. 1 each 3   ? cetirizine (ZYRTEC ALLERGY) 10 MG tablet Take 1 tablet (10 mg total) by mouth 2 (two) times daily. 60 tablet 5   ? cholecalciferol (VITAMIN D3) 25 MCG (1000 UNIT) tablet Take 1,000 Units by mouth daily.     ? EPINEPHrine 0.3 mg/0.3 mL IJ SOAJ injection Inject 0.3 mg into the muscle  as needed for anaphylaxis. 1 each 2   ? famotidine (PEPCID) 20 MG tablet Take 1 tablet (20 mg total) by mouth 2 (two) times daily. 60 tablet 5   ? hydrALAZINE (APRESOLINE) 100 MG tablet Take 1 tablet (100 mg total) by mouth 3 (three) times daily. 90 tablet 1   ? hydrOXYzine (VISTARIL) 25 MG capsule Take 1-2 capsules (25-50 mg total) by mouth 3 (three) times daily as needed for itching. 60 capsule 1   ? labetalol (NORMODYNE) 300 MG tablet Take 0.5 tablets (150 mg total) by mouth 3 (three) times daily. 135 tablet 1   ? levothyroxine (SYNTHROID) 125 MCG tablet Take 1 tablet (125 mcg total) by mouth daily before breakfast. 45 tablet 3   ? ondansetron (ZOFRAN-ODT) 8 MG disintegrating tablet Take 1 tablet (8 mg total) by mouth every 8 (eight) hours as needed for nausea or vomiting. 20 tablet 2   ? pantoprazole (PROTONIX) 40 MG tablet Take 1 tablet (40 mg total) by mouth daily. 30 tablet 4   ? potassium chloride SA (KLOR-CON M) 20 MEQ tablet Take 1 tablet (20 mEq total) by mouth daily for 7 days. 7 tablet 0   ? Prenatal Multivit-Min-Fe-FA (PRE-NATAL PO) Take by mouth.     ? promethazine (PHENERGAN) 25 MG tablet Take 1 tablet (25 mg total) by mouth every 6 (six) hours as needed for nausea or vomiting. 30 tablet 2   ? scopolamine (TRANSDERM-SCOP) 1 MG/3DAYS Place 1 patch (1.5 mg total) onto the skin every 3 (three) days. 10 patch 12   ? venlafaxine XR (EFFEXOR-XR) 150 MG 24 hr capsule Take 150 mg by mouth daily.     ? ? ?Physical Exam: ?BP (!) 203/110 (BP Location: Left Arm) Comment: hold labetalol protocol per Dr. Nehemiah Settle   Pulse 84   Temp (!) 97.5 ?F (36.4 ?C) (Oral)   Resp 20   Ht '5\' 11"'$  (1.803 m)   LMP 01/19/2021   SpO2 100%   BMI 31.74 kg/m?  ? ?BP (!) 203/110 (BP Location: Left Arm) Comment: hold labetalol protocol per D

## 2021-07-02 NOTE — Progress Notes (Signed)
MFM Note ? ?Krystal [name] was seen due to uncontrolled labile blood pressures.  She is currently treated with Norvasc 10 mg daily, labetalol up to 200 mg 3 times a day, and hydralazine 100 mg 3 times a day.   ? ?She has a history of preeclampsia with two prior preterm births at 26 weeks and 5 days and at 39 weeks and 3 days.   ? ?Her blood pressures today were 168/101 and 161/110.   ? ?The EFW on her last exam on today's exam, measured at the 6 percentile for her gestational age.  ? ?She had a cell free DNA test indicating a low risk for trisomy 21, 18, and 13.  A female fetus is predicted.  ? ?On today's exam, the overall EFW (442 grams, 1lb) measured at the 2nd percentile for her gestational age.  There was normal amniotic fluid noted. ? ?Doppler studies of the umbilical arteries showed an elevated S/D ratio of 5.96.  There were no signs of absent or reversed end-diastolic flow.   ? 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  A small/narrowed pulmonary artery along with a VSD and overriding aorta were noted.  The fetal cardiac axis appeared within normal limits.  An echogenic focus continues to be noted in the left ventricle of the fetal heart.  Possible premature atrial contractions were also noted today. ? ?The following were discussed during today's consultation: ? 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  The overall prognosis for the baby after surgical repair is usually good.  ? ?I will make a referral to pediatric cardiology at [facility] for a fetal echocardiogram. ? ?As babies born with tetralogy of Fallot may not always require immediate surgical repair, she will most likely be able to deliver at [facility].  We will await the recommendations from the pediatric cardiologist. ? ?The association of a congenital heart defect with fetal chromosomal abnormalities was discussed.  The patient was advised regarding the  availability of an amniocentesis for definitive diagnosis of fetal chromosomal abnormalities and for the 22 q.11.2 deletion syndrome.  She declined the procedure today. ? ?Chronic hypertension with extremely elevated blood pressures despite treatment with 3 antihypertensive medications ? ?Due to her severe range blood pressures despite treatment, the patient was sent to the hospital for admission and blood pressure control. ? ?She should have a 24-hour urine collected during her hospital admission and have [place] labs drawn to evaluate for preeclampsia. ? ?As the patient has extremely labile blood pressures which may be difficult to control, cardiology should be consulted to help with the management of her blood pressures and to make adjustments to the dosages of her antihypertensive medications. ? ?The goal is to maintain her blood pressure in the 140s over 90s range. ? ?The increased risk of another indicated preterm birth and the increased risk of preeclampsia due to her severely elevated blood pressures along with IUGR was discussed.  She was advised to continue taking a daily baby aspirin for preeclampsia prophylaxis. ? ?Once her blood pressures are under better control and she is discharged home, we will continue to follow her closely with ultrasound exams.   ? ?She will return to our office in 2 weeks for an amniotic fluid check and umbilical artery Doppler studies.  We will reassess the fetal growth again in 3 weeks. ? ?The patient stated that all of her questions were answered today. ? ?A total of  30 minutes was spent counseling and coordinating the care for this patient.  Greater than 50% of the time was spent in direct face-to-face contact. ?

## 2021-07-02 NOTE — Progress Notes (Deleted)
Date:  07/02/2021   ID:  Krystal [name], DOB 10/29/1990, MRN [id number] The patient was identified using 2 identifiers.  Patient Location: Home Provider Location: Office/Clinic   Cardio-Obstetrics Clinic  Follow-up:  Date:  07/02/2021   ID:  Krystal [name], DOB 04/25/1990, MRN [id number]  PCP:  Audley Hose, MD   [facility] Providers Cardiologist:  None  Electrophysiologist:  None       Referring MD: Audley Hose, MD   Chief Complaint: uncontrolled HTN   History of Present Illness:    Krystal [name] is a 31 y.o. female [G5P0222] who presents to clinic for follow-up of HTN.  Patient has a history of hypothyroidism (goal TSH during pregnancy <2.5 in first trimester and <3 afterwards), chronic HTN diagnosed at age 69, and 2 preterm deliveries due to HELLP syndrome who was initially seen in clinic for severely elevated blood pressures. We have been adjusting her antihypertensive agents over the past several weeks and she is currently on labetalol '200mg'$  TID, hydralazine '100mg'$  TID, and amlodipine '5mg'$  daily.  Today, the patient is seen virtually per her preference. She states that her blood pressures are mainly running 130-140s at home if she is able to take her medications. She is continuing to struggle with frequent nausea and vomiting. She thinks labetalol and her prenatal vitamin may be contributing. She denies any chest pain but has been feeling more shortness of breath. Also notes that she is having to sleep more propped up at night due to feeling SOB with lying flat. No LE edema, chest pain, or palpitations.   Prior CV Studies Reviewed: The following studies were reviewed today: No CV studies  Past Medical History:  Diagnosis Date   Acute acalculous cholecystitis s/p lap cholecystectomy 05/11/2020 05/11/2020   Angio-edema 01/15/2021   Anxiety    Asthma    HELLP (hemolytic anemia/elev liver enzymes/low platelets in pregnancy) 12/08/2017   IC  (interstitial cystitis)    Kidney stones    Migraines    Severe uncontrolled hypertension 10/08/2017   UTI (urinary tract infection)     Past Surgical History:  Procedure Laterality Date   APPENDECTOMY     BIOPSY  05/10/2020   Procedure: BIOPSY;  Surgeon: Arta Silence, MD;  Location: [facility];  Service: Endoscopy;;   CESAREAN SECTION N/A 12/06/2017   Procedure: CESAREAN SECTION;  Surgeon: Sloan Leiter, MD;  Location: [place];  Service: Obstetrics;  Laterality: N/A;   CHOLECYSTECTOMY N/A 05/11/2020   Procedure: SINGLE SITE LAPAROSCOPIC CHOLECYSTECTOMY AND LIVER BIOSY;  Surgeon: Michael Boston, MD;  Location: [facility];  Service: General;  Laterality: N/A;   ESOPHAGOGASTRODUODENOSCOPY (EGD) WITH PROPOFOL N/A 05/10/2020   Procedure: ESOPHAGOGASTRODUODENOSCOPY (EGD) WITH PROPOFOL;  Surgeon: Arta Silence, MD;  Location: [facility];  Service: Endoscopy;  Laterality: N/A;   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     Allergies:   Red dye, Compazine [prochlorperazine maleate], Other, Vicodin [hydrocodone-acetaminophen], Amitriptyline hcl, Duloxetine hcl, Reglan [metoclopramide], and Nifedipine   Social History   Socioeconomic History   Marital status: Married    Spouse name: Not on file   Number of children: Not on file   Years of education: Not on file   Highest education level: Not on file  Occupational History   Not on file  Tobacco Use   Smoking status: Never   Smokeless tobacco: Never   Tobacco comments:    She tried a cigarette and  never smoked again  Vaping Use   Vaping Use: Never used  Substance and Sexual Activity   Alcohol use: Not Currently    Comment: bottle of wine a week-occ   Drug use: No   Sexual activity: Yes  Other Topics Concern   Not on file  Social History Narrative   Not on file   Social Determinants of Health   Financial Resource Strain: Not on file  Food Insecurity: No Food Insecurity   Worried About Running Out of Food in the Last Year: Never true   Ran Out of Food in the Last Year: Never true  Transportation Needs: Unmet Transportation Needs   Lack of Transportation (Medical): Yes   Lack of Transportation (Non-Medical): Yes  Physical Activity: Not on file  Stress: Not on file  Social Connections: Not on file      Family History  Problem Relation Age of Onset   Hypertension Mother    Hypertension Father       ROS:   Please see the history of present illness.    All other systems reviewed and are negative.   Labs/EKG Reviewed:    EKG:   EKG is not ordered today.   Recent Labs: 05/23/2021: TSH 3.790 06/10/2021: Magnesium 2.0; NT-Pro BNP 165 06/27/2021: ALT 19; B Natriuretic Peptide 28.9; BUN 14; Creatinine, Ser 0.78; Hemoglobin 13.1; Platelets 326; Potassium 2.9; Sodium 135   Recent Lipid Panel No results found for: CHOL, TRIG, HDL, CHOLHDL, LDLCALC, LDLDIRECT  Physical Exam:    VS:  LMP 01/19/2021     Wt Readings from Last 3 Encounters:  06/19/21 227 lb 9.6 oz (103.2 kg)  05/23/21 223 lb (101.2 kg)  05/08/21 218 lb 1.6 oz (98.9 kg)     No exam due to virtual visit   Risk Assessment/Risk Calculators:           { Click for [id number] Score - THEN Refresh Note    :[id number]}      ASSESSMENT & PLAN:    #Severe HTN: Patient with long-standing history of hypertension initially diagnosed at age 66. Had work-up at [facility] for secondary causes which was unrevealing per report. Has history of pre-eclampsia and HELLP syndrome in her first pregnancy as well as  pre-eclampsia in her second pregnancy. Has had very difficult to manage HTN during current pregnancy. Has several medication intolerances with nausea/vomiting to higher doses of labetalol.  Was unable to tolerate nicardipine due to "jitteriness".  Overall, blood pressure is better controlled but still above goal. Will try to increase amlodipine dosing and reduce labetalol to help with symptoms. Will need continued close follow-up. -Increase amlodipine to '10mg'$  daily -Decrease labetalol to '150mg'$  TID; may be able to wean further pending blood pressures as she continues to report nausea/vomiting with the med -Can add  HCTZ if needed; will need to be cautious however she has had low K due to persistent vomiting -Continue hydralazine 100 mg  TID -Keep BP log for [place] to review -Needs to be seen in clinic by PharmD and [place] in the next couple of weeks  #SOB/Concern for Orthopnea: Patient reports worsening SOB on exertion as well as dyspnea while laying flat She is now having to sleep propped up on 2 pillows which is new. Concerned that she may be developing volume overload in the setting of poorly controlled HTN. Will check TTE and BNP to assess further.  -Check TTE -Check BNP and BMET  #Hypokalemia: Developed in the setting of persistent nausea and vomiting. Has been on potassium supplementation. Will need to repeat BMET next week. -Check BMET  #History of HELLP Syndrome: #Preeclampsia: She is high risk for recurrent pre-eclampsia and HELLP syndrome. Currently managing blood pressure as above and will continue frequent visits for medication optimization.  There are no Patient Instructions on file for this visit.   Dispo:  No follow-ups on file.   Medication Adjustments/Labs and Tests Ordered: Current medicines are reviewed at length with the patient today.  Concerns regarding medicines are outlined above.  Tests Ordered: No orders of the defined types were placed in this encounter.  Medication  Changes: No orders of the defined types were placed in this encounter.

## 2021-07-03 ENCOUNTER — Encounter (HOSPITAL_COMMUNITY): Payer: Self-pay | Admitting: Obstetrics and Gynecology

## 2021-07-03 ENCOUNTER — Other Ambulatory Visit: Payer: Self-pay | Admitting: *Deleted

## 2021-07-03 DIAGNOSIS — I10 Essential (primary) hypertension: Secondary | ICD-10-CM

## 2021-07-03 DIAGNOSIS — O10912 Unspecified pre-existing hypertension complicating pregnancy, second trimester: Secondary | ICD-10-CM

## 2021-07-03 DIAGNOSIS — O09299 Supervision of pregnancy with other poor reproductive or obstetric history, unspecified trimester: Secondary | ICD-10-CM | POA: Diagnosis not present

## 2021-07-03 DIAGNOSIS — R519 Headache, unspecified: Secondary | ICD-10-CM

## 2021-07-03 DIAGNOSIS — O26892 Other specified pregnancy related conditions, second trimester: Secondary | ICD-10-CM | POA: Diagnosis not present

## 2021-07-03 DIAGNOSIS — Z3A23 23 weeks gestation of pregnancy: Secondary | ICD-10-CM

## 2021-07-03 DIAGNOSIS — O365921 Maternal care for other known or suspected poor fetal growth, second trimester, fetus 1: Secondary | ICD-10-CM

## 2021-07-03 DIAGNOSIS — O365920 Maternal care for other known or suspected poor fetal growth, second trimester, not applicable or unspecified: Secondary | ICD-10-CM

## 2021-07-03 DIAGNOSIS — O365990 Maternal care for other known or suspected poor fetal growth, unspecified trimester, not applicable or unspecified: Secondary | ICD-10-CM | POA: Diagnosis not present

## 2021-07-03 LAB — BASIC METABOLIC PANEL
Anion gap: 6 (ref 5–15)
BUN: 8 mg/dL (ref 6–20)
CO2: 23 mmol/L (ref 22–32)
Calcium: 8.8 mg/dL — ABNORMAL LOW (ref 8.9–10.3)
Chloride: 105 mmol/L (ref 98–111)
Creatinine, Ser: 0.63 mg/dL (ref 0.44–1.00)
GFR, Estimated: >60 mL/min (ref >60)
Glucose, Bld: 108 mg/dL — ABNORMAL HIGH (ref 70–99)
Potassium: 3.4 mmol/L — ABNORMAL LOW (ref 3.5–5.1)
Sodium: 134 mmol/L — ABNORMAL LOW (ref 135–145)

## 2021-07-03 LAB — SYPHILIS: RPR W/REFLEX TO RPR TITER AND TREPONEMAL ANTIBODIES, TRADITIONAL SCREENING AND DIAGNOSIS ALGORITHM: RPR Ser Ql: NONREACTIVE

## 2021-07-03 LAB — T4, FREE
Free T4: 0.72 ng/dL (ref 0.61–1.12)
Free T4: 0.89 ng/dL (ref 0.61–1.12)

## 2021-07-03 LAB — TSH
TSH: 2.178 u[IU]/mL (ref 0.350–4.500)
TSH: 2.154 u[IU]/mL (ref 0.350–4.500)

## 2021-07-03 MED ORDER — LABETALOL HCL 200 MG PO TABS
300.0000 mg | ORAL_TABLET | Freq: Three times a day (TID) | ORAL | Status: DC
  Administered 2021-07-03 (×2): 300 mg via ORAL
  Filled 2021-07-03 (×2): qty 1

## 2021-07-03 MED ORDER — HYDROMORPHONE HCL 1 MG/ML IJ SOLN
1.0000 mg | Freq: Once | INTRAMUSCULAR | Status: AC
  Administered 2021-07-03: 1 mg via INTRAVENOUS
  Filled 2021-07-03: qty 1

## 2021-07-03 MED ORDER — POTASSIUM CHLORIDE 20 MEQ PO PACK
40.0000 meq | PACK | Freq: Once | ORAL | Status: AC
  Administered 2021-07-03: 40 meq via ORAL
  Filled 2021-07-03: qty 2

## 2021-07-03 MED ORDER — MAGNESIUM OXIDE -MG SUPPLEMENT 400 (240 MG) MG PO TABS
400.0000 mg | ORAL_TABLET | Freq: Two times a day (BID) | ORAL | Status: DC
  Administered 2021-07-03 – 2021-07-16 (×23): 400 mg via ORAL
  Filled 2021-07-03 (×25): qty 1

## 2021-07-03 MED ORDER — LABETALOL HCL 200 MG PO TABS: 400.0000 mg | ORAL_TABLET | Freq: Three times a day (TID) | ORAL | Status: DC

## 2021-07-03 MED ORDER — POTASSIUM CHLORIDE CRYS ER 20 MEQ PO TBCR: 40.0000 meq | EXTENDED_RELEASE_TABLET | Freq: Once | ORAL | Status: DC

## 2021-07-03 MED ORDER — LABETALOL HCL 200 MG PO TABS: 200.0000 mg | ORAL_TABLET | Freq: Once | ORAL | Status: DC

## 2021-07-03 MED ORDER — HYDROXYZINE HCL 50 MG PO TABS
25.0000 mg | ORAL_TABLET | Freq: Three times a day (TID) | ORAL | Status: DC | PRN
  Administered 2021-07-04 – 2021-07-12 (×14): 50 mg via ORAL
  Filled 2021-07-03 (×16): qty 1

## 2021-07-03 MED ORDER — CYCLOBENZAPRINE HCL 10 MG PO TABS
10.0000 mg | ORAL_TABLET | Freq: Three times a day (TID) | ORAL | Status: DC | PRN
  Administered 2021-07-03 – 2021-07-16 (×19): 10 mg via ORAL
  Filled 2021-07-03 (×21): qty 1

## 2021-07-03 MED ORDER — DIPHENHYDRAMINE HCL 50 MG/ML IJ SOLN
25.0000 mg | Freq: Four times a day (QID) | INTRAMUSCULAR | Status: DC | PRN
  Administered 2021-07-03 – 2021-07-10 (×21): 25 mg via INTRAVENOUS
  Filled 2021-07-03 (×22): qty 1

## 2021-07-03 MED ORDER — LACTATED RINGERS IV SOLN: INTRAVENOUS | Status: DC

## 2021-07-03 MED ORDER — ONDANSETRON 4 MG PO TBDP
8.0000 mg | ORAL_TABLET | Freq: Three times a day (TID) | ORAL | Status: DC | PRN
  Administered 2021-07-03 (×2): 8 mg via ORAL
  Filled 2021-07-03 (×2): qty 2

## 2021-07-03 MED ORDER — ACETAMINOPHEN 10 MG/ML IV SOLN
1000.0000 mg | Freq: Once | INTRAVENOUS | Status: AC
  Administered 2021-07-03: 1000 mg via INTRAVENOUS
  Filled 2021-07-03: qty 100

## 2021-07-03 MED ORDER — SODIUM CHLORIDE 0.9 % IV SOLN
25.0000 mg | Freq: Four times a day (QID) | INTRAVENOUS | Status: DC | PRN
  Administered 2021-07-03 – 2021-07-21 (×37): 25 mg via INTRAVENOUS
  Filled 2021-07-03 (×38): qty 1

## 2021-07-03 MED ORDER — SODIUM CHLORIDE 0.9 % IV SOLN
8.0000 mg | Freq: Three times a day (TID) | INTRAVENOUS | Status: DC | PRN
  Administered 2021-07-04 – 2021-07-05 (×3): 8 mg via INTRAVENOUS
  Filled 2021-07-03 (×3): qty 4

## 2021-07-03 MED ORDER — METHYLPREDNISOLONE SODIUM SUCC 40 MG IJ SOLR
16.0000 mg | Freq: Three times a day (TID) | INTRAMUSCULAR | Status: DC
  Administered 2021-07-03 – 2021-07-05 (×7): 16 mg via INTRAVENOUS
  Filled 2021-07-03 (×8): qty 0.4

## 2021-07-03 NOTE — Progress Notes (Unsigned)
Us/

## 2021-07-03 NOTE — Consult Note (Addendum)
?Cardiology Consultation:  ? ?Patient ID: Krystal [name] ?MRN: [id number]; DOB: [birth year] ? ?Admit date: 07/02/2021 ?Date of Consult: 07/03/2021 ? ?PCP:  Audley Hose, MD ?  ?[facility] ?Cardiologist:  Freada Bergeron, MD      ? ? ?Patient Profile:  ? ?Krystal [name] is a 31 y.o. 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 Annamaria Boots. She is in her 23rd week of pregnancy. ? ?History of Present Illness:  ? ?Per MFM notes, the patient has a history of preeclampsia with 2 prior pre-term births at 74w5dand 365w3dShe been followed by Dr. PeJohney Framen our cardio-obstetrics division and the hypertension clinic for blood pressure control during her current pregnancy. Her note indicates the [facility]. During patient's evaluation 05/2021 she complained of increased SOB so echo and BNP were ordered. BNP was 165. Echo showed normal EF, mild LVH, mild MR. There is also notation of intermittent chest tightness previously. She had an EKG performed 06/27/21 for this and it did not show any acute changes. She has had several medication intolerances with nausea/vomiting with higher doses of labelolol and jitteriness with nicardipine. She has require intermittent potassium supplementation for hypokalemia in the setting of persistent nausea and vomiting. She was felt to be doing well at last pharmD visit on 4/26, with BP regimen to include amlodipine '10mg'$  daily, hydralazine '100mg'$  TID, labetalol '150mg'$  TID. The goal is to maintain her BP in the 140s/90s range per MFM note. Methyldopa has not been utilized as it is no [place] but felt less ideal with her frequent  hypokalemia. ? ?MFM [place] demonstrated the possibility of Tetralogy of Fall[place]; per [place] they discussed result with patient and [facility] HTN in the 160s/100s despite 3 meds at that encounter, they recommended admission to the hospital for pre-eclampsia labs as well as cardiology consult for HTN. Upon arrival she also reported having a severe headache last few days, blurred vision with halos and dizziness, and had right sided anisicoria noted. CT/MRI were negative for acute findings. Neurology was consulted and per preliminary neurology resident note, this was felt possibly from atypical migraine with aura. They noted that patient had been on scopolamine patch and albuterol inhaler which could also cause anisocoria. Labs notable for K 2.9->3.3, albumin 3.6->3.3, urine was brown with trace Hgb, 100 protein, ?specific gravity. BNP and troponin normal. 24 hour urine protein underway. She was placed on labetalol '200mg'$  TID, continued on home hydralazine and amlodipine, and received 2 doses of IV labetalol this morning ('20mg'$  and '40mg'$ ) - peak BP was 203/110, with last BP measured 130/92. The patient states her headache is currently better due to meds this AM but that it has felt totally different than her prior migraines, which she states have otherwise not occurred during this pregnancy. Anisocoria has improved. She has also continued to periodically require Zofran for her nausea and vomiting. She indicates the intermittent SOB and chest tightness have remained about the same, about 3x/day, variable in length - this can occur when she is moving about the house with her children or even when doing nothing at all in the context of feeling  stressed/anxious about something. She does not presently feel this. ? ?She notes it has been an upsetting week as her father who recently suffered a series of strokes was placed on hospice in [place]. She does not  smoke. She used to have occasional drink but not since pregnancy. Denies illicit drug use. She has a strong family history of HTN in multiple relatives, heart attacks in second degree relatives, and mother was born with some sort of absence of a heart valve or narrowing for which she had to have a procedure. ? ? ?Past Medical History:  ?Diagnosis Date  ? Acute acalculous cholecystitis s/p lap cholecystectomy 05/11/2020 05/11/2020  ? Angio-edema 01/15/2021  ? Anxiety   ? Asthma   ? 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 IC (interstitial cystitis)   ? Kidney stones   ? Migraines   ? Necrotizing fasciitis ([place])   ? left hand amputation  ? Severe uncontrolled hypertension 10/08/2017  ? UTI (urinary tract infection)   ? ? ?Past Surgical History:  ?Procedure Laterality Date  ? APPENDECTOMY    ? BIOPSY  05/10/2020  ? Procedure: BIOPSY;  Surgeon: Arta Silence, MD;  Location: [facility];  Service: Endoscopy;;  ? CESAREAN SECTION N/A 12/06/2017  ? Procedure: CESAREAN SECTION;  Surgeon: Sloan Leiter, MD;  Location: [place];  Service: Obstetrics;  Laterality: N/A;  ? CHOLECYSTECTOMY N/A 05/11/2020  ? Procedure: SINGLE SITE LAPAROSCOPIC CHOLECYSTECTOMY AND LIVER BIOSY;  Surgeon: Michael Boston, MD;  Location: [facility];  Service: General;  Laterality: N/A;  ? ESOPHAGOGASTRODUODENOSCOPY (EGD) WITH PROPOFOL N/A 05/10/2020  ? Procedure: ESOPHAGOGASTRODUODENOSCOPY (EGD) WITH PROPOFOL;  Surgeon: Arta Silence, MD;  Location: [facility];  Service: Endoscopy;  Laterality: N/A;  ? 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 HAND RECONSTRUCTION Right   ? INCISION AND DRAINAGE    ? NASAL SEPTUM SURGERY    ?  ? ?Home Medications:  ?Prior to Admission medications   ?Medication Sig Start Date End Date Taking? Authorizing Provider  ?acetaminophen (TYLENOL) 500 MG tablet Take 2 tablets (1,000 mg total) by mouth every 6 (six) hours as needed. 05/14/20   Bonnielee Haff, MD   ?albuterol (PROVENTIL HFA;VENTOLIN HFA) 108 (90 Base) MCG/ACT inhaler Inhale 2 puffs into the lungs every 6 (six) hours as needed for wheezing or shortness of breath.    [provider]  ?amLODipine (NORVASC) 10 MG tablet Take 1 tablet (10 mg total) by mouth daily. 05/31/21   Freada Bergeron, MD  ?aspirin EC 81 MG tablet Take 1 tablet (81 mg total) by mouth daily. Take after 12 weeks for prevention of preeclampsia later in pregnancy 04/01/21   Chancy Milroy, MD  ?budesonide-formoterol [facility]) 80-4.5 MCG/ACT inhaler Inhale 2 puffs into the lungs in the morning and at bedtime. Take for 1-2 weeks at a time during asthma flare. Rinse mouth after each use. 03/26/21   Garnet Sierras, DO  ?cetirizine (ZYRTEC ALLERGY) 10 MG tablet Take 1 tablet (10 mg total) by mouth 2 (two) times daily. 03/26/21   Garnet Sierras, DO  ?cholecalciferol (VITAMIN D3) 25 MCG (1000 UNIT) tablet Take 1,000 Units by mouth daily.    [provider]  ?EPINEPHrine 0.3 mg/0.3 mL IJ SOAJ injection Inject 0.3 mg into the muscle as needed for anaphylaxis. 01/15/21   Garnet Sierras, DO  ?famotidine (PEPCID) 20 MG tablet Take 1 tablet (20 mg total) by mouth 2 (two) times daily. 03/26/21  Garnet Sierras, DO  ?hydrALAZINE (APRESOLINE) 100 MG tablet Take 1 tablet (100 mg total) by mouth 3 (three) times daily. 05/14/20   Bonnielee Haff, MD  ?hydrOXYzine (VISTARIL) 25 MG capsule Take 1-2 capsules (25-50 mg total) by mouth 3 (three) times daily as needed for itching. 05/08/21   Rasch, Anderson [place] I, NP  ?labetalol (NORMODYNE) 300 MG tablet Take 0.5 tablets (150 mg total) by mouth 3 (three) times daily. 05/31/21   Freada Bergeron, MD  ?levothyroxine (SYNTHROID) 125 MCG tablet Take 1 tablet (125 mcg total) by mouth daily before breakfast. 05/24/21   Philemon Kingdom, MD  ?ondansetron (ZOFRAN-ODT) 8 MG disintegrating tablet Take 1 tablet (8 mg total) by mouth every 8 (eight) hours as needed for nausea or vomiting. 05/08/21   Rasch, Anderson Malta I, NP   ?pantoprazole (PROTONIX) 40 MG tablet Take 1 tablet (40 mg total) by mouth daily. 06/13/21   Woodroe Mode, MD  ?potassium chloride SA (KLOR-CON M) 20 MEQ tablet Take 1 tablet (20 mEq total) by mouth daily f

## 2021-07-03 NOTE — Progress Notes (Addendum)
NEUROLOGY CONSULTATION PROGRESS NOTE  ? ?Date of service: Jul 03, 2021 ?Patient Name: Krystal [name] ?MRN:  [id number] ?DOB:  03-19-1990 ? ?Brief HPI  ?Krystal [name] is a 31 y.o. female [phone] with IUP at  23 weeks with a PMH significant for HELLP (12/08/2017), Migraines, Severe uncontrolled hypertension (10/08/2017), who presents with acute onset headache and anisocoria concerning for CVA with negative head/brain imagine.  ?  ?Interval Hx  ? ?Patient resting in bed. She continues to have a headache and some nausea but overall, symptoms are improving. She denies any weakness or change in sensation. Her blurry vision has improved but still has some mild peripheral vision deficit on the right  worse than the left. She denies any floaters or halos.   ?Vitals  ? ?Vitals:  ? 07/02/21 1827 07/02/21 2200 07/02/21 2304 07/03/21 0334  ?BP: (!) 162/99 (!) 162/100 (!) 146/92 (!) 158/92  ?Pulse: 71 71 63 63  ?Resp: '18 18 18 18  '$ ?Temp: 98.4 ?F (36.9 ?C) 98 ?F (36.7 ?C) 98 ?F (36.7 ?C)   ?TempSrc: Oral Oral Oral   ?SpO2: 100%     ?Height:      ?  ? ?Body mass index is 31.74 kg/m?. ? ?Physical Exam  ? ?General: Laying comfortably in bed; in no acute distress. ?HENT: Normal oropharynx and mucosa. Normal external appearance of ears and nose.  ?Neck: Supple, no pain or tenderness  ?CV: No JVD. No peripheral edema.  ?Pulmonary: Symmetric Chest rise. Normal respiratory effort.  ?Ext: No cyanosis, edema, or deformity, left arm amputation  ?Skin: No rash. Normal palpation of skin.   ?Musculoskeletal: Normal digits and nails by inspection. No clubbing.  ? ?Neurologic Examination  ?Mental status/Cognition: Alert, oriented to self, place, month and year, good attention.  ?Speech/language: Fluent, comprehension intact, object naming intact, repetition intact.  ?Cranial nerves:  ? CN II Right pupil minimally more dialated than left with postive light reflex and accomodation, minimal right temporal peripheral vision deficits   ? CN  III,IV,VI EOM intact, no gaze preference or deviation, no nystagmus   ? CN V normal sensation in V1, V2, and V3 segments bilaterally   ? CN VII no asymmetry, no nasolabial fold flattening   ? CN VIII normal hearing to speech   ? CN IX & X normal palatal elevation, no uvular deviation   ? CN XI 5/5 head turn and 5/5 shoulder shrug bilaterally   ? CN XII midline tongue protrusion   ? ?Motor:  ?Muscle bulk: normal, tone normal, pronator drift negative, tremor absent ?5/5 Strength in bilateral upper and lower extremities ? ?Sensation: ?Grossly normal sensation in bilateral upper and lower extremities.  ? ?Coordination/Complex Motor:  ?- Finger to Nose intact ? ? ?Labs  ? ?Basic Metabolic Panel:  ?Lab Results  ?Component Value Date  ? NA 137 07/02/2021  ? K 3.3 (L) 07/02/2021  ? CO2 24 07/02/2021  ? GLUCOSE 79 07/02/2021  ? BUN 13 07/02/2021  ? CREATININE 0.68 07/02/2021  ? CALCIUM 8.9 07/02/2021  ? GFRNONAA >60 07/02/2021  ? GFRAA >60 12/18/2017  ? ?HbA1c:  ?Lab Results  ?Component Value Date  ? HGBA1C 5.1 04/01/2021  ? ?LDL: No results found for: LDLCALC ?Urine Drug Screen:  ?   ?Component Value Date/Time  ? [place] DETECTED 11/18/2020 2140  ? [place] DETECTED 11/18/2020 2140  ? LABBENZ POSITIVE (A) 11/18/2020 2140  ? [place] DETECTED 11/18/2020 2140  ? THCU POSITIVE (A) 11/18/2020 2140  ? LABBARB NONE  DETECTED 11/18/2020 2140  ?  ?Alcohol Level  ?   ?Component Value Date/Time  ? ETH <10 11/18/2020 1745  ? ?No results found for: PHENYTOIN, ZONISAMIDE, LAMOTRIGINE, LEVETIRACETA ?No results found for: PHENYTOIN, PHENOBARB, VALPROATE, CBMZ ? ?Imaging and Diagnostic studies  ?Results for orders placed during the hospital encounter of 07/02/21 ? ?MR ANGIO HEAD WO CONTRAST ? ?Narrative ?CLINICAL DATA:  Initial evaluation for acute headache, currently ?pregnant. ? ?EXAM: ?MRI HEAD WITHOUT CONTRAST ? ?MRA HEAD WITHOUT CONTRAST ? ?MRV HEAD WITHOUT CONTRAST ? ?TECHNIQUE: ?Multiplanar, multi-echo pulse  sequences of the brain and surrounding ?structures were acquired without intravenous contrast. Angiographic ?images of the Circle of Willis were acquired using MRA technique ?without intravenous contrast. Additional dedicated MRV images ?without contrast were also obtained. ? ?COMPARISON:  Prior CT from earlier the same day. ? ?FINDINGS: ?MRI HEAD FINDINGS ? ?Brain: Examination mildly degraded by motion. ? ?Cerebral volume within normal limits. No focal parenchymal signal ?abnormality. No evidence for acute or subacute infarct. Gray-white ?matter differentiation maintained. No areas of chronic cortical ?infarction. No acute or chronic intracranial blood products. ? ?No mass lesion, midline shift or mass effect. No hydrocephalus or ?extra-axial fluid collection. Pituitary gland suprasellar region ?normal. Midline structures intact and normally formed. ? ?Vascular: Major intracranial vascular flow voids are well ?maintained. ? ?Skull and upper cervical spine: Craniocervical junction within ?normal limits. Bone marrow signal intensity normal. No scalp soft ?tissue abnormality. ? ?Sinuses/Orbits: Globes and orbital soft tissues within normal ?limits. Mild scattered mucosal thickening noted about the ethmoidal ?air cells and maxillary sinuses. Paranasal sinuses are otherwise ?clear. No significant mastoid effusion. ? ?Other: None. ? ?MRA HEAD FINDINGS ? ?Anterior circulation: Both internal carotid arteries widely patent ?to the termini without stenosis. A1 segments widely patent. Normal ?anterior communicating artery complex. Both anterior cerebral ?arteries widely patent to their distal aspects without stenosis. No ?M1 stenosis or occlusion. Normal MCA bifurcations. Distal MCA ?branches well perfused and symmetric. ? ?Posterior circulation: Both V4 segments widely patent without ?stenosis. Right PICA patent. Left PICA origin not visualized. ?Basilar patent without stenosis. Superior cerebral arteries  patent ?bilaterally. Right PCA supplied via the basilar. Left PCA supplied ?via a hypoplastic left P1 segment and robust left posterior ?communicating artery. Both PCAs well perfused or distal aspects ?without stenosis. ? ?Anatomic variants: As above. No intracranial aneurysm or other ?vascular malformation. ? ?MRV HEAD FINDINGS ? ?Normal flow related signal seen throughout the superior sagittal ?sinus to the torcula. Transverse and sigmoid sinuses are patent as ?are the jugular bulbs and visualized proximal internal jugular ?veins. Straight sinus, vein of Galen, internal cerebral veins, basal ?veins of Rosenthal appear patent. No appreciable abnormality about ?the cavernous sinus. No visible cortical vein thrombosis. No dural ?venous sinus thrombosis. ? ?IMPRESSION: ?1. Normal MRI of the brain. No acute intracranial abnormality ?identified. ?2. Normal intracranial MRA. ?3. Normal intracranial MRV. No evidence for dural venous sinus ?thrombosis. ? ? ?Electronically Signed ?By: Jeannine Boga M.D. ?On: 07/02/2021 19:32 ? ? ?Impression  ? ?Chrystal Zeimet Laakso is a 31 y.o. female [phone] with IUP at  23 weeks with a PMH significant for HELLP (12/08/2017), Migraines, Severe uncontrolled hypertension (10/08/2017), who presents with acute onset headache and anisocoria concerning for CVA with negative head/brain imagine.  ? ?Atypical Migraine with aura ?Patient overall improving.  She continues to have significant head pain with nausea with some right peripheral vision reduce.  Anisocoria is improving.  She denies any other focal neurologic deficits.  All brain imaging has been  unremarkable.  Symptoms likely secondary to atypical migraine however, certainly possible however less likely.  Patient resume regular prenatal care and neurology will sign off.  We will certainly be available for additional questions if they arise.  ? ?Recommendations  ? ?Atypical Migraine with aura ?- No additional stroke work up  ?-  Recommend Reglan and IV benadryl for headache ?- Will need follow up in OP headache clinic.  ?- Continue routine prenatal care per OB ?- Neurology signing off. ?______________________________________________________________________ ?

## 2021-07-03 NOTE — Progress Notes (Signed)
Checked in on pt. Pt with headache and N/V continued. She feels dehydrated and dry.  ? ?Reviewed will repeat a dose of the IV dilaudid for now but not a good long term option (pt agrees) and that we will try IV solumedrol to see if this improves her N/V and likely also her HA. Reviewed plan of care with neurology who finds this acceptable given patients other allergies/intolerances and our lack of success with other measures.  ? ?Discussed option with cardiology - they agree as they feel primary limit to her BP control is ability to keep down the medications. Dr. Johney Frame felt comfortable with doing the IV steroids while she is inpatient so that if increased in blood pressure we can manage with IV medication.  ? ?Radene Gunning, MD ?Attending [place], Faculty Practice ?[facility] [place] ? ? ?

## 2021-07-03 NOTE — Progress Notes (Addendum)
FACULTY PRACTICE ANTEPARTUM COMPREHENSIVE PROGRESS NOTE ? ?Krystal [name] is a 31 y.o. [id number] at 68w4dwho is admitted for management of refractory HTN.  Estimated Date of Delivery: 10/26/21 ?Fetal presentation is cephalic. ? ?Length of Stay:  1 Days. Admitted 07/02/2021 ? ?Subjective: ?Pt reports improved blurry vision. N/V this morning but feeling improved with IV medications. Headache resolves with IV therapy.  ? ?Patient reports good fetal movement.  She reports no uterine contractions, no bleeding and no loss of fluid per vagina. ? ?Vitals:  Blood pressure (!) 130/92, pulse (!) 58, temperature 97.9 ?F (36.6 ?C), temperature source Oral, resp. rate 16, height '5\' 11"'$  (1.803 m), last menstrual period 01/19/2021, SpO2 100 %, not currently breastfeeding. ?Physical Examination: ?CONSTITUTIONAL: Well-developed, well-nourished female in no acute distress.  ?NEUROLOGIC: Alert and oriented to person, place, and time. No cranial nerve deficit noted. ?PSYCHIATRIC: Normal mood and affect. Normal behavior. Normal judgment and thought content. ?CARDIOVASCULAR: Normal heart rate noted, regular rhythm ?RESPIRATORY: Effort and breath sounds normal, no problems with respiration noted ?MUSCULOSKELETAL: Normal range of motion. No edema and no tenderness. 2+ distal pulses. ?ABDOMEN: Soft, nontender, nondistended, gravid. ?CERVIX:  Not examiend ? ? ? ?Results for orders placed or performed during the hospital encounter of 07/02/21 (from the past 48 hour(s))  ?Type and screen [place]    Status: None  ? Collection Time: 07/02/21  3:07 PM  ?Result Value Ref Range  ? ABO/RH(D) O POS   ? Antibody Screen NEG   ? Sample Expiration    ?  07/05/2021,2359 ?Performed at [facility] [address], [place] [address]?  ?Comprehensive metabolic panel     Status: Abnormal  ? Collection Time: 07/02/21  3:07 PM  ?Result Value Ref Range  ? Sodium 137 135 - 145 mmol/L  ? Potassium 3.3 (L) 3.5 - 5.1 mmol/L  ?  Chloride 106 98 - 111 mmol/L  ? CO2 24 22 - 32 mmol/L  ? Glucose, Bld 79 70 - 99 mg/dL  ?  Comment: Glucose reference range applies only to samples taken after fasting for at least 8 hours.  ? BUN 13 6 - 20 mg/dL  ? Creatinine, Ser 0.68 0.44 - 1.00 mg/dL  ? Calcium 8.9 8.9 - 10.3 mg/dL  ? Total Protein 6.0 (L) 6.5 - 8.1 g/dL  ? Albumin 3.3 (L) 3.5 - 5.0 g/dL  ? AST 20 15 - 41 U/L  ? ALT 18 0 - 44 U/L  ? Alkaline Phosphatase 51 38 - 126 U/L  ? Total Bilirubin 0.8 0.3 - 1.2 mg/dL  ? GFR, Estimated >60 >60 mL/min  ?  Comment: (NOTE) ?Calculated using the CKD-EPI Creatinine Equation (2021) ?  ? Anion gap 7 5 - 15  ?  Comment: Performed at [facility] [address] [place] [address] ?CBC     Status: Abnormal  ? Collection Time: 07/02/21  3:07 PM  ?Result Value Ref Range  ? WBC 8.7 4.0 - 10.5 K/uL  ? RBC 3.64 (L) 3.87 - 5.11 MIL/uL  ? Hemoglobin 12.0 12.0 - 15.0 g/dL  ? HCT 33.6 (L) 36.0 - 46.0 %  ? MCV 92.3 80.0 - 100.0 fL  ? MCH 33.0 26.0 - 34.0 pg  ? MCHC 35.7 30.0 - 36.0 g/dL  ? RDW 11.9 11.5 - 15.5 %  ? Platelets 202 150 - 400 K/uL  ? nRBC 0.0 0.0 - 0.2 %  ?  Comment: Performed at  [facility], [place] [address] [place], [place] [address]  ?RPR     Status: None  ? Collection Time: 07/02/21  3:07 PM  ?Result Value Ref Range  ? RPR Ser Ql NON REACTIVE NON REACTIVE  ?  Comment: Performed at [facility], [place] [address] [place], [place] [address]  ?TSH     Status: None  ? Collection Time: 07/02/21  3:07 PM  ?Result Value Ref Range  ? TSH 2.178 0.350 - 4.500 uIU/mL  ?  Comment: Performed by a 3rd Generation assay with a functional sensitivity of <=0.01 uIU/mL. ?Performed at [facility], [place] [address], [place], [place] [address] ?  ?Glucose, capillary     Status: None  ? Collection Time: 07/02/21  3:34 PM  ?Result Value Ref Range  ? Glucose-Capillary 78 70 - 99 mg/dL  ?  Comment: Glucose reference range applies only to samples taken after fasting for at least 8 hours.  ?Protein /  creatinine ratio, urine     Status: None  ? Collection Time: 07/02/21  8:48 PM  ?Result Value Ref Range  ? Creatinine, Urine 231.92 mg/dL  ? Total Protein, Urine 25 mg/dL  ?  Comment: NO NORMAL RANGE ESTABLISHED FOR THIS TEST  ? Protein Creatinine Ratio 0.11 0.00 - 0.15 mg/mg[Cre]  ?  Comment: Performed at [facility], [place] [address], [place], [place] [address]  ? ? ?CT HEAD WO CONTRAST (5MM) ? ?Result Date: 07/02/2021 ?CLINICAL DATA:  Headache EXAM: CT HEAD WITHOUT CONTRAST TECHNIQUE: Contiguous axial images were obtained from the base of the skull through the vertex without intravenous contrast. RADIATION DOSE REDUCTION: This exam was performed according to the departmental dose-optimization program which includes automated exposure control, adjustment of the mA and/or kV according to patient size and/or use of iterative reconstruction technique. COMPARISON:  None Available. FINDINGS: Brain: No evidence of acute infarction, hemorrhage, hydrocephalus, extra-axial collection or mass lesion/mass effect. Vascular: Negative for hyperdense vessel Skull: Negative Sinuses/Orbits: Paranasal sinuses clear.  Negative orbit Other: None IMPRESSION: Negative CT head Electronically Signed   By: Franchot Gallo M.D.   On: 07/02/2021 15:52  ? ?MR ANGIO HEAD WO CONTRAST ? ?Result Date: 07/02/2021 ?CLINICAL DATA:  Initial evaluation for acute headache, currently pregnant. EXAM: MRI HEAD WITHOUT CONTRAST MRA HEAD WITHOUT CONTRAST MRV HEAD WITHOUT CONTRAST TECHNIQUE: Multiplanar, multi-echo pulse sequences of the brain and surrounding structures were acquired without intravenous contrast. Angiographic images of the Circle of Willis were acquired using MRA technique without intravenous contrast. Additional dedicated MRV images without contrast were also obtained. COMPARISON:  Prior CT from earlier the same day. FINDINGS: MRI HEAD FINDINGS Brain: Examination mildly degraded by motion. Cerebral volume within normal limits. No focal  parenchymal signal abnormality. No evidence for acute or subacute infarct. Gray-white matter differentiation maintained. No areas of chronic cortical infarction. No acute or chronic intracranial blood products. No mass lesion, midline shift or mass effect. No hydrocephalus or extra-axial fluid collection. Pituitary gland suprasellar region normal. Midline structures intact and normally formed. Vascular: Major intracranial vascular flow voids are well maintained. Skull and upper cervical spine: Craniocervical junction within normal limits. Bone marrow signal intensity normal. No scalp soft tissue abnormality. Sinuses/Orbits: Globes and orbital soft tissues within normal limits. Mild scattered mucosal thickening noted about the ethmoidal air cells and maxillary sinuses. Paranasal sinuses are otherwise clear. No significant mastoid effusion. Other: None. MRA HEAD FINDINGS Anterior circulation: Both internal carotid arteries widely patent to the termini without  stenosis. A1 segments widely patent. Normal anterior communicating artery complex. Both anterior cerebral arteries widely patent to their distal aspects without stenosis. No M1 stenosis or occlusion. Normal MCA bifurcations. Distal MCA branches well perfused and symmetric. Posterior circulation: Both V4 segments widely patent without stenosis. Right PICA patent. Left PICA origin not visualized. Basilar patent without stenosis. Superior cerebral arteries patent bilaterally. Right PCA supplied via the basilar. Left PCA supplied via a hypoplastic left P1 segment and robust left posterior communicating artery. Both PCAs well perfused or distal aspects without stenosis. Anatomic variants: As above. No intracranial aneurysm or other vascular malformation. MRV HEAD FINDINGS Normal flow related signal seen throughout the superior sagittal sinus to the torcula. Transverse and sigmoid sinuses are patent as are the jugular bulbs and visualized proximal internal jugular  veins. Straight sinus, vein of Galen, internal cerebral veins, basal veins of Rosenthal appear patent. No appreciable abnormality about the cavernous sinus. No visible cortical vein thrombosis. No dural venous sin

## 2021-07-03 NOTE — Consult Note (Signed)
MFM Note ? ?Krystal [name] is currently at 23 weeks and 4 days.  She was hospitalized yesterday for blood pressure control.  She continues to complain of a severe headache.  Her brain MRI and head CT did not show any acute pathologies. ? ?Her [place] labs were all within normal limits.  Her P/C ratio was 0.11 indicating that she does not have preeclampsia at this time.  A 24-hour urine is currently pending. ? ?The patient has been seen by cardiology who has recommended that her labetalol dose be increased to 300 mg 3 times a day. She will be continued on amlodipine 10 mg daily and hydralazine 100 mg 3 times a day. ? ?The patient will be started on IV Solu-Medrol in an attempt to relieve some of the nausea that she has been experiencing and possibly for headache relief.  Hopefully if her nausea is relieved, she will be able to tolerate the oral hypertensive medications. ? ?The patient was advised that she will be hospitalized to receive IV steroids and IV antihypertensive medications as needed until her blood pressures are under better control and her symptoms are improved.  ? ?The dosage of her labetalol should be increased as needed. ? ?Due to her significantly elevated blood pressures and her past history of early onset severe preeclampsia, she should continue taking 2 baby aspirin tablets daily for preeclampsia prophylaxis. ? ?We will continue to follow her with ultrasound exams once she is discharged for fetal assessment and to monitor the fetal growth. Another appointment has been scheduled in 2 weeks.  ? ?She should receive a complete course of antenatal corticosteroids (betamethasone) should she be diagnosed with preeclampsia or if her symptoms worsen and she is at risk of delivery. ? ?Due to a fetus with suspected tetralogy of Fallot, she will have a fetal echocardiogram scheduled once she is discharged.  ? ?The patient was advised that we will try to do everything possible to try to delay her delivery until  she reaches a more optimal gestational age (76 weeks or greater).  ? ?

## 2021-07-04 ENCOUNTER — Inpatient Hospital Stay (HOSPITAL_COMMUNITY)
Admit: 2021-07-04 | Discharge: 2021-07-04 | Disposition: A | Discharge status: Home / Self Care | Payer: Commercial Managed Care - HMO | Attending: Pediatric Cardiology | Admitting: Pediatric Cardiology

## 2021-07-04 ENCOUNTER — Other Ambulatory Visit: Payer: Self-pay

## 2021-07-04 DIAGNOSIS — O09299 Supervision of pregnancy with other poor reproductive or obstetric history, unspecified trimester: Secondary | ICD-10-CM | POA: Diagnosis not present

## 2021-07-04 DIAGNOSIS — R519 Headache, unspecified: Secondary | ICD-10-CM

## 2021-07-04 DIAGNOSIS — O26892 Other specified pregnancy related conditions, second trimester: Secondary | ICD-10-CM

## 2021-07-04 DIAGNOSIS — O365990 Maternal care for other known or suspected poor fetal growth, unspecified trimester, not applicable or unspecified: Secondary | ICD-10-CM | POA: Diagnosis not present

## 2021-07-04 DIAGNOSIS — Q213 Tetralogy of Fallot: Secondary | ICD-10-CM | POA: Diagnosis not present

## 2021-07-04 DIAGNOSIS — O10912 Unspecified pre-existing hypertension complicating pregnancy, second trimester: Secondary | ICD-10-CM | POA: Diagnosis not present

## 2021-07-04 LAB — PROTEIN, URINE, 24 HOUR
Collection Interval-UPROT: 24 hours
Protein, 24H Urine: 176 mg/d — ABNORMAL HIGH (ref 50–100)
Protein, Urine: 11 mg/dL
Urine Total Volume-UPROT: 1600 mL

## 2021-07-04 MED ORDER — ACETAMINOPHEN 500 MG PO TABS
1000.0000 mg | ORAL_TABLET | Freq: Once | ORAL | Status: AC
  Administered 2021-07-04: 1000 mg via ORAL
  Filled 2021-07-04: qty 2

## 2021-07-04 MED ORDER — ASPIRIN EC 81 MG PO TBEC
162.0000 mg | DELAYED_RELEASE_TABLET | Freq: Every day | ORAL | Status: DC
Start: 2021-07-04 — End: 2021-07-16
  Administered 2021-07-04 – 2021-07-16 (×13): 162 mg via ORAL
  Filled 2021-07-04 (×14): qty 2

## 2021-07-04 MED ORDER — HALOPERIDOL LACTATE 5 MG/ML IJ SOLN
1.0000 mg | Freq: Four times a day (QID) | INTRAMUSCULAR | Status: DC | PRN
  Administered 2021-07-04: 1 mg via INTRAVENOUS
  Filled 2021-07-04 (×2): qty 0.2

## 2021-07-04 MED ORDER — LORAZEPAM 2 MG/ML IJ SOLN
1.0000 mg | Freq: Once | INTRAMUSCULAR | Status: AC
  Filled 2021-07-04 (×2): qty 1

## 2021-07-04 MED ORDER — POTASSIUM CHLORIDE ER 10 MEQ PO TBCR
40.0000 meq | EXTENDED_RELEASE_TABLET | Freq: Once | ORAL | Status: AC
  Administered 2021-07-04: 40 meq via ORAL
  Filled 2021-07-04 (×2): qty 4

## 2021-07-04 MED ORDER — LORAZEPAM 2 MG/ML IJ SOLN
INTRAMUSCULAR | Status: AC
  Administered 2021-07-07: 1 mg via INTRAVENOUS
  Filled 2021-07-04: qty 1

## 2021-07-04 MED ORDER — LABETALOL HCL 200 MG PO TABS
200.0000 mg | ORAL_TABLET | Freq: Three times a day (TID) | ORAL | Status: DC
Start: 2021-07-04 — End: 2021-07-06
  Administered 2021-07-04 – 2021-07-06 (×6): 200 mg via ORAL
  Filled 2021-07-04 (×6): qty 1

## 2021-07-04 MED ORDER — HYDROMORPHONE HCL 1 MG/ML IJ SOLN
1.0000 mg | Freq: Once | INTRAMUSCULAR | Status: AC
  Administered 2021-07-04: 1 mg via INTRAVENOUS
  Filled 2021-07-04: qty 1

## 2021-07-04 NOTE — Progress Notes (Signed)
FACULTY PRACTICE ANTEPARTUM COMPREHENSIVE PROGRESS NOTE ? ?Krystal [name] is a 31 y.o. [id number] at 43w5dwho is admitted for management of refractory HTN.  Estimated Date of Delivery: 10/26/21 ?Fetal presentation is cephalic. ? ?Length of Stay:  2 Days. Admitted 07/02/2021 ? ?Subjective: ?HA is now a 6/10 (from 10/10). Her emesis is resolved, but still nauseated.  ? ?Patient reports good fetal movement.  She reports no uterine contractions, no bleeding and no loss of fluid per vagina. ? ?Vitals:  Blood pressure 131/82, pulse 68, temperature 98.1 ?F (36.7 ?C), temperature source Oral, resp. rate 16, height '5\' 11"'$  (1.803 m), last menstrual period 01/19/2021, SpO2 97 %, not currently breastfeeding. ?Physical Examination: ?CONSTITUTIONAL: Well-developed, well-nourished female in no acute distress.  ?NEUROLOGIC: Alert and oriented to person, place, and time. No cranial nerve deficit noted. ?PSYCHIATRIC: Normal mood and affect. Normal behavior. Normal judgment and thought content. ?CARDIOVASCULAR: Normal heart rate noted, regular rhythm ?RESPIRATORY: Effort and breath sounds normal, no problems with respiration noted ?MUSCULOSKELETAL: Normal range of motion. No edema and no tenderness. 2+ distal pulses. ?ABDOMEN: Soft, nontender, nondistended, gravid. ?CERVIX:  Not examined ? ? ? ?Results for orders placed or performed during the hospital encounter of 07/02/21 (from the past 48 hour(s))  ?Protein, urine, 24 hour     Status: Abnormal  ? Collection Time: 07/02/21  2:13 PM  ?Result Value Ref Range  ? Urine Total Volume-UPROT 1,600 mL  ?  Comment: CORRECTED ON 05/11 AT 00923 PREVIOUSLY REPORTED AS 1600  ? Collection Interval-UPROT 24 hours  ? Protein, Urine 11 mg/dL  ? Protein, 24H Urine 176 (H) 50 - 100 mg/day  ?  Comment: [facility]formed at [facility] [address], [place] [address] ?Type and screen [place]    Status: None  ? Collection Time: 07/02/21  3:07 PM  ?Result Value Ref Range  ?  ABO/RH(D) O POS   ? Antibody Screen NEG   ? Sample Expiration    ?  07/05/2021,2359 ?Performed at [facility] [address] [address] [place] [place] [address]?  ?Comprehensive metabolic panel     Status: Abnormal  ? Collection Time: 07/02/21  3:07 PM  ?Result Value Ref Range  ? Sodium 137 135 - 145 mmol/L  ? Potassium 3.3 (L) 3.5 - 5.1 mmol/L  ? Chloride 106 98 - 111 mmol/L  ? CO2 24 22 - 32 mmol/L  ? Glucose, Bld 79 70 - 99 mg/dL  ?  Comment: Glucose reference range applies only to samples taken after fasting for at least 8 hours.  ? BUN 13 6 - 20 mg/dL  ? Creatinine, Ser 0.68 0.44 - 1.00 mg/dL  ? Calcium 8.9 8.9 - 10.3 mg/dL  ? Total Protein 6.0 (L) 6.5 - 8.1 g/dL  ? Albumin 3.3 (L) 3.5 - 5.0 g/dL  ? AST 20 15 - 41 U/L  ? ALT 18 0 - 44 U/L  ? Alkaline Phosphatase 51 38 - 126 U/L  ? Total Bilirubin 0.8 0.3 - 1.2 mg/dL  ? GFR, Estimated >60 >60 mL/min  ?  Comment: (NOTE) ?Calculated using the CKD-EPI Creatinine Equation (2021) ?  ? Anion gap 7 5 - 15  ?  Comment: Performed at [facility] [address], [name] [address] ?CBC     Status: Abnormal  ? Collection Time: 07/02/21  3:07 PM  ?Result Value Ref Range  ? WBC 8.7 4.0 - 10.5 K/uL  ? RBC  3.64 (L) 3.87 - 5.11 MIL/uL  ? Hemoglobin 12.0 12.0 - 15.0 g/dL  ? HCT 33.6 (L) 36.0 - 46.0 %  ? MCV 92.3 80.0 - 100.0 fL  ? MCH 33.0 26.0 - 34.0 pg  ? MCHC 35.7 30.0 - 36.0 g/dL  ? RDW 11.9 11.5 - 15.5 %  ? Platelets 202 150 - 400 K/uL  ? nRBC 0.0 0.0 - 0.2 %  ?  Comment: Performed at [facility], [place] [address] [place], [place] [address]  ?RPR     Status: None  ? Collection Time: 07/02/21  3:07 PM  ?Result Value Ref Range  ? RPR Ser Ql NON REACTIVE NON REACTIVE  ?  Comment: Performed at [facility], [place] [address], [place], [place] [address]  ?TSH     Status: None  ? Collection Time: 07/02/21  3:07 PM  ?Result Value Ref Range  ? TSH 2.178 0.350 - 4.500 uIU/mL  ?  Comment: Performed by a 3rd Generation assay with a functional sensitivity of  <=0.01 uIU/mL. ?Performed at [facility], [place] [address] [place], [place] [address] ?  ?T4, free     Status: None  ? Collection Time: 07/02/21  3:07 PM  ?Result Value Ref Range  ? Free T4 0.72 0.61 - 1.12 ng/dL  ?  Comment: (NOTE) ?Biotin ingestion may interfere with free T4 tests. If the results are ?inconsistent with the TSH level, previous test results, or the ?clinical presentation, then consider biotin interference. If needed, ?order repeat testing after stopping biotin. ?Performed at [facility], [place] [address], [place], [place] ?[address] ?  ?Glucose, capillary     Status: None  ? Collection Time: 07/02/21  3:34 PM  ?Result Value Ref Range  ? Glucose-Capillary 78 70 - 99 mg/dL  ?  Comment: Glucose reference range applies only to samples taken after fasting for at least 8 hours.  ?Protein / creatinine ratio, urine     Status: None  ? Collection Time: 07/02/21  8:48 PM  ?Result Value Ref Range  ? Creatinine, Urine 231.92 mg/dL  ? Total Protein, Urine 25 mg/dL  ?  Comment: NO NORMAL RANGE ESTABLISHED FOR THIS TEST  ? Protein Creatinine Ratio 0.11 0.00 - 0.15 mg/mg[Cre]  ?  Comment: Performed at [facility], [place] [address] [place], [place] [address]  ?TSH     Status: None  ? Collection Time: 07/03/21 12:28 PM  ?Result Value Ref Range  ? TSH 2.154 0.350 - 4.500 uIU/mL  ?  Comment: Performed by a 3rd Generation assay with a functional sensitivity of <=0.01 uIU/mL. ?Performed at [facility], [place] [address], [place], [place] [address] ?  ?T4, free     Status: None  ? Collection Time: 07/03/21 12:28 PM  ?Result Value Ref Range  ? Free T4 0.89 0.61 - 1.12 ng/dL  ?  Comment: (NOTE) ?Biotin ingestion may interfere with free T4 tests. If the results are ?inconsistent with the TSH level, previous test results, or the ?clinical presentation, then consider biotin interference. If needed, ?order repeat testing after stopping biotin. ?Performed at [facility], [place] [address],  [place], [place] ?[address] ?  ?Basic metabolic panel     Status: Abnormal  ? Collection Time: 07/03/21 12:28 PM  ?Result Value Ref Range  ? Sodium 134 (L) 135 - 145 mmol/L  ? Potassium 3.4 (L) 3.5 - 5.1 mmol/L  ? Chloride 105 98 - 111 mmol/L  ?  CO2 23 22 - 32 mmol/L  ? Glucose, Bld 108 (H) 70 - 99 mg/dL  ?  Comment: Glucose reference range applies only to samples taken after fasting for at least 8 hours.  ? BUN 8 6 - 20 mg/dL  ? Creatinine, Ser 0.63 0.44 - 1.00 mg/dL  ? Calcium 8.8 (L) 8.9 - 10.3 mg/dL  ? GFR, Estimated >60 >60 mL/min  ?  Comment: (NOTE) ?Calculated using the CKD-EPI Creatinine Equation (2021) ?  ? Anion gap 6 5 - 15  ?  Comment: Performed at [facility], [place] [address] [place], [place] [address]  ? ? ?CT HEAD WO CONTRAST (5MM) ? ?Result Date: 07/02/2021 ?CLINICAL DATA:  Headache EXAM: CT HEAD WITHOUT CONTRAST TECHNIQUE: Contiguous axial images were obtained from the base of the skull through the vertex without intravenous contrast. RADIATION DOSE REDUCTION: This exam was performed according to the departmental dose-optimization program which includes automated exposure control, adjustment of the mA and/or kV according to patient size and/or use of iterative reconstruction technique. COMPARISON:  None Available. FINDINGS: Brain: No evidence of acute infarction, hemorrhage, hydrocephalus, extra-axial collection or mass lesion/mass effect. Vascular: Negative for hyperdense vessel Skull: Negative Sinuses/Orbits: Paranasal sinuses clear.  Negative orbit Other: None IMPRESSION: Negative CT head Electronically Signed   By: Franchot Gallo M.D.   On: 07/02/2021 15:52  ? ?MR ANGIO HEAD WO CONTRAST ? ?Result Date: 07/02/2021 ?CLINICAL DATA:  Initial evaluation for acute headache, currently pregnant. EXAM: MRI HEAD WITHOUT CONTRAST MRA HEAD WITHOUT CONTRAST MRV HEAD WITHOUT CONTRAST TECHNIQUE: Multiplanar, multi-echo pulse sequences of the brain and surrounding structures were acquired without intravenous  contrast. Angiographic images of the Circle of Willis were acquired using MRA technique without intravenous contrast. Additional dedicated MRV images without contrast were also obtained. COMPARISON:  Prior CT from

## 2021-07-04 NOTE — Consult Note (Signed)
I had the pleasure of seeing Krystal [name] on Jul 04, 2021  in consultation for concern for fetal congenital heart disease at the request of Radene Gunning, MD. ? ?History of Present Illness: ?Sequita is a 31 y.o. female [phone] now 23 weeks 5 days gestation with a femal fetus admitted for hypertension management.  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 Ms. Keniston has a history of pre-eclampsia and HELLP syndrome.  She denies history of congenital heart disease in her family however history is limited by patient discomfort during the exam. Ms. Gaulin reports a developing worsening headache and nausea.  She is currently on multiple medications for hypertension and Aspirin for HELLP prophylaxis.  ? ? ?Past Medical History: ?Past Medical History:  ?Diagnosis Date  ? Acute acalculous cholecystitis s/p lap cholecystectomy 05/11/2020 05/11/2020  ? Angio-edema 01/15/2021  ? Anxiety   ? Asthma   ? 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 IC (interstitial cystitis)   ? Kidney stones   ? Migraines   ? Necrotizing fasciitis ([place])   ? left hand amputation  ? Severe uncontrolled hypertension 10/08/2017  ? UTI (urinary tract infection)   ? ?Past Surgical History:  ?Procedure Laterality Date  ? APPENDECTOMY    ? BIOPSY  05/10/2020  ? Procedure: BIOPSY;  Surgeon: Arta Silence, MD;  Location: [facility];  Service: Endoscopy;;  ? CESAREAN SECTION N/A 12/06/2017  ? Procedure: CESAREAN SECTION;  Surgeon: Sloan Leiter, MD;  Location: [place];  Service: Obstetrics;  Laterality: N/A;  ? CHOLECYSTECTOMY N/A 05/11/2020  ? Procedure: SINGLE SITE LAPAROSCOPIC CHOLECYSTECTOMY AND LIVER BIOSY;  Surgeon: Michael Boston, MD;  Location: [facility];  Service: General;  Laterality: N/A;  ? ESOPHAGOGASTRODUODENOSCOPY (EGD) WITH PROPOFOL N/A 05/10/2020  ? Procedure:  ESOPHAGOGASTRODUODENOSCOPY (EGD) WITH PROPOFOL;  Surgeon: Arta Silence, MD;  Location: [facility];  Service: Endoscopy;  Laterality: N/A;  ? 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 HAND RECONSTRUCTION Right   ? INCISION AND DRAINAGE    ? NASAL SEPTUM SURGERY    ? ? ?Medications: ? ?Current Facility-Administered Medications:  ?  acetaminophen (TYLENOL) tablet 650 mg, 650 mg, Oral, Q4H PRN, Radene Gunning, MD, 650 mg at 07/03/21 9935 ?  albuterol (VENTOLIN HFA) 108 (90 Base) MCG/ACT inhaler 2 puff, 2 puff, Inhalation, Q6H PRN, Radene Gunning, MD ?  amLODipine (NORVASC) tablet 10 mg, 10 mg, Oral, Daily, Radene Gunning, MD, 10 mg at 07/04/21 0945 ?  aspirin EC tablet 162 mg, 162 mg, Oral, Daily, Radene Gunning, MD, 162 mg at 07/04/21 0944 ?  calcium carbonate (TUMS - dosed in mg elemental calcium) chewable tablet 400 mg of elemental calcium, 2 tablet, Oral, Q4H PRN, Radene Gunning, MD ?  cyclobenzaprine (FLEXERIL) tablet 10 mg, 10 mg, Oral, TID PRN, Radene Gunning, MD, 10 mg at 07/03/21 1401 ?  diphenhydrAMINE (BENADRYL) injection 25 mg, 25 mg, Intravenous, Q6H PRN, Radene Gunning, MD, 25 mg at 07/04/21 1101 ?  famotidine (PEPCID) tablet 20 mg, 20 mg, Oral, BID, Radene Gunning, MD, 20 mg at 07/03/21 1155 ?  haloperidol lactate (HALDOL) injection 1 mg, 1 mg, Intravenous, Q6H PRN, Radene Gunning, MD, 1 mg at 07/04/21 1256 ?  labetalol (NORMODYNE) injection 20 mg, 20 mg, Intravenous, PRN, 20 mg at 07/03/21 0840 **AND** labetalol (NORMODYNE) injection 40 mg, 40 mg, Intravenous, PRN, 40 mg at 07/03/21 0910 **AND** labetalol (NORMODYNE) injection  80 mg, 80 mg, Intravenous, PRN **AND** hydrALAZINE (APRESOLINE) injection 10 mg, 10 mg, Intravenous, PRN **AND** Measure blood pressure, , , Once, Radene Gunning, MD ?  hydrALAZINE (APRESOLINE) tablet 100 mg, 100 mg, Oral, TID, Radene Gunning, MD, 100 mg at 07/04/21 0944 ?  hydrOXYzine (ATARAX) tablet 25-50 mg, 25-50 mg, Oral, TID PRN, Radene Gunning, MD, 50 mg at 07/04/21  1048 ?  labetalol (NORMODYNE) tablet 200 mg, 200 mg, Oral, TID, [place], Greer Ee, MD ?  lactated ringers infusion, , Intravenous, Continuous, Radene Gunning, MD, Last Rate: 150 mL/hr at 07/04/21 1452, New Bag at 07/04/21 1452 ?  levothyroxine (SYNTHROID) tablet 125 mcg, 125 mcg, Oral, QAC breakfast, Radene Gunning, MD, 125 mcg at 07/04/21 0559 ?  loratadine (CLARITIN) tablet 10 mg, 10 mg, Oral, Daily, Radene Gunning, MD, 10 mg at 07/03/21 1154 ?  LORazepam (ATIVAN) 2 MG/ML injection, , , ,  ?  LORazepam (ATIVAN) injection 1 mg, 1 mg, Intravenous, Once, Radene Gunning, MD ?  magnesium oxide (MAG-OX) tablet 400 mg, 400 mg, Oral, BID, Radene Gunning, MD, 400 mg at 07/03/21 1643 ?  methylPREDNISolone sodium succinate (SOLU-MEDROL) 40 mg/mL injection 16 mg, 16 mg, Intravenous, Q8H, Radene Gunning, MD, 16 mg at 07/04/21 1055 ?  ondansetron (ZOFRAN) 8 mg in sodium chloride 0.9 % 50 mL IVPB, 8 mg, Intravenous, Q8H PRN, Last Rate: 216 mL/hr at 07/04/21 1043, 8 mg at 07/04/21 1043 **OR** ondansetron (ZOFRAN-ODT) disintegrating tablet 8 mg, 8 mg, Oral, Q8H PRN, Radene Gunning, MD, 8 mg at 07/03/21 2151 ?  pantoprazole (PROTONIX) EC tablet 40 mg, 40 mg, Oral, Daily, Radene Gunning, MD, 40 mg at 07/03/21 1154 ?  potassium chloride (KLOR-CON) CR tablet 40 mEq, 40 mEq, Oral, Once, Freada Bergeron, MD ?  prenatal multivitamin tablet 1 tablet, 1 tablet, Oral, Q1200, Radene Gunning, MD, 1 tablet at 07/02/21 1515 ?  promethazine (PHENERGAN) 25 mg in sodium chloride 0.9 % 50 mL IVPB, 25 mg, Intravenous, Q6H PRN, Radene Gunning, MD, Last Rate: 200 mL/hr at 07/04/21 0843, 25 mg at 07/04/21 0843 ?  scopolamine (TRANSDERM-SCOP) 1 MG/3DAYS 1.5 mg, 1 patch, Transdermal, Q72H, Radene Gunning, MD, 1.5 mg at 07/02/21 1523 ?  venlafaxine XR (EFFEXOR-XR) 24 hr capsule 150 mg, 150 mg, Oral, Daily, Radene Gunning, MD, 150 mg at 07/02/21 2202 ?  zolpidem (AMBIEN) tablet 5 mg, 5 mg, Oral, QHS PRN, Radene Gunning, MD  ? ?Allergies: ?Allergies  ?Allergen  Reactions  ? Red Dye Other (See Comments)  ?  ALL CAUSE PROBLEMS ?#40 IS WORST-MIGRAINES   ? Compazine [Prochlorperazine Maleate] Other (See Comments)  ?  TWITCHING, CANT STAY STILL. Pt states she can tolerate promethazine  ? Other Other (See Comments)  ?  FANSIDAR FOR MALARIA-ASTHMA  ? Vicodin [Hydrocodone-Acetaminophen] Itching, Nausea And Vomiting and Other (See Comments)  ?  Pt states she can tolerate acetaminophen  ? Amitriptyline Hcl Other (See Comments)  ?  Angry moods  ? Duloxetine Hcl Other (See Comments)  ?  Passing out, trimble, irritable  ? Reglan [Metoclopramide] Other (See Comments)  ?  restless  ? Nifedipine Nausea And Vomiting and Other (See Comments)  ?  Discussed with patient and maternal fetal medicine, patient has intolerance but not frank allergy.  ? ? ?Family History: ?Summerlyn's family history includes Hypertension in her father and mother. ?There is no other known family history of congenital heart disease, arrhythmias, sudden cardiac death, or early myocardial infarction. ? ?Fetal echocardiogram: A complete study was performed today, which was technically good. Please  see separate echocardiogram report for dull details.  ? ?Fetal echocardiogram performed at 23 weeks and 5 days. Fair study due to maternal body habitus  ? 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 ? ?Discussion: ?Corlette is a 31 y.o. 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  Tetralogy of Fallot is manifested by the presence of an anteriorly malaligned ventricular septal defect, resulting in an over-riding aorta and pulmonary valve stenosis. The finding of right ventricular hypertroph

## 2021-07-04 NOTE — Progress Notes (Signed)
? ?Progress Note ? ?Patient Name: Krystal [name] ?Date of Encounter: 07/04/2021 ? ?[facility] Cardiologist: Freada Bergeron, MD  ? ?Subjective  ? ?Continues to be nauseas. Had one episode of emesis. HA is "bad" right now. Just received second dose of steroids. ? ?BP much better controlled in 130/80s. Labetalol continues to make nausea worse. ? ?K 3.4 this AM; repletion given ? ?Inpatient Medications  ?  ?Scheduled Meds: ? amLODipine  10 mg Oral Daily  ? aspirin EC  162 mg Oral Daily  ? famotidine  20 mg Oral BID  ? hydrALAZINE  100 mg Oral TID  ? labetalol  300 mg Oral TID  ? levothyroxine  125 mcg Oral QAC breakfast  ? loratadine  10 mg Oral Daily  ? magnesium oxide  400 mg Oral BID  ? methylPREDNISolone (SOLU-MEDROL) injection  16 mg Intravenous Q8H  ? pantoprazole  40 mg Oral Daily  ? potassium chloride  40 mEq Oral Once  ? prenatal multivitamin  1 tablet Oral Q1200  ? scopolamine  1 patch Transdermal Q72H  ? venlafaxine XR  150 mg Oral Daily  ? ?Continuous Infusions: ? lactated ringers 150 mL/hr at 07/04/21 0839  ? ondansetron [facility]) IV 8 mg (07/04/21 1043)  ? promethazine (PHENERGAN) injection (IM or IVPB) 25 mg (07/04/21 0843)  ? ?PRN Meds: ?acetaminophen, albuterol, calcium carbonate, cyclobenzaprine, diphenhydrAMINE, labetalol **AND** labetalol **AND** labetalol **AND** hydrALAZINE **AND** Measure blood pressure, hydrOXYzine, ondansetron (ZOFRAN) IV **OR** ondansetron, promethazine (PHENERGAN) injection (IM or IVPB), zolpidem  ? ?Vital Signs  ?  ?Vitals:  ? 07/03/21 1940 07/04/21 0018 07/04/21 0416 07/04/21 0845  ?BP: 135/85 139/78 134/81 131/82  ?Pulse: 73 62 63 68  ?Resp: '18 18 18 16  '$ ?Temp: 98.1 ?F (36.7 ?C) 98.3 ?F (36.8 ?C) 97.6 ?F (36.4 ?C) 98.1 ?F (36.7 ?C)  ?TempSrc: Oral Oral Oral Oral  ?SpO2: 100% 98% 96% 97%  ?Height:      ? ?No intake or output data in the 24 hours ending 07/04/21 1103 ? ?  06/19/2021  ?  9:07 AM 05/23/2021  ? 10:37 AM 05/08/2021  ?  1:27 PM  ?Last 3 Weights  ?Weight  (lbs) 227 lb 9.6 oz 223 lb 218 lb 1.6 oz  ?Weight (kg) 103.239 kg 101.152 kg 98.93 kg  ?   ? ?Telemetry  ?  ?NSR - Personally Reviewed ? ?ECG  ?  ?No new tracing - Personally Reviewed ? ?Physical Exam  ? ?GEN: Uncomfortable appearing  ?Neck: No JVD ?Cardiac: RRR, no murmurs, rubs, or gallops.  ?Respiratory: Clear to auscultation bilaterally. ?GI: Gravid, nontender ?MS: No edema; No deformity. ?Neuro:  Nonfocal  ?Psych: Normal affect  ? ?Labs  ?  ?High Sensitivity Troponin:   ?Recent Labs  ?Lab 06/27/21 ?1637  ?TROPONINIHS 12  ?   ?Chemistry ?Recent Labs  ?Lab 06/27/21 ?1637 07/02/21 ?1507 07/03/21 ?1228  ?NA 135 137 134*  ?K 2.9* 3.3* 3.4*  ?CL 101 106 105  ?CO2 '24 24 23  '$ ?GLUCOSE 79 79 108*  ?BUN '14 13 8  '$ ?CREATININE 0.78 0.68 0.63  ?CALCIUM 9.1 8.9 8.8*  ?PROT 6.7 6.0*  --   ?ALBUMIN 3.6 3.3*  --   ?AST 22 20  --   ?ALT 19 18  --   ?ALKPHOS 52 51  --   ?BILITOT 0.8 0.8  --   ?GFRNONAA >60 >60 >60  ?ANIONGAP '10 7 6  '$ ?  ?Lipids No results for input(s): CHOL, TRIG, HDL, LABVLDL, LDLCALC, CHOLHDL in the  last 168 hours.  ?Hematology ?Recent Labs  ?Lab 06/27/21 ?1637 07/02/21 ?1507  ?WBC 11.1* 8.7  ?RBC 3.97 3.64*  ?HGB 13.1 12.0  ?HCT 36.0 33.6*  ?MCV 90.7 92.3  ?MCH 33.0 33.0  ?MCHC 36.4* 35.7  ?RDW 11.9 11.9  ?PLT 326 202  ? ?Thyroid  ?Recent Labs  ?Lab 07/03/21 ?1228  ?TSH 2.154  ?FREET4 0.89  ?  ?BNP ?Recent Labs  ?Lab 06/27/21 ?1637  ?BNP 28.9  ?  ?DDimer No results for input(s): DDIMER in the last 168 hours.  ? ?Radiology  ?  ?CT HEAD WO CONTRAST (5MM) ? ?Result Date: 07/02/2021 ?CLINICAL DATA:  Headache EXAM: CT HEAD WITHOUT CONTRAST TECHNIQUE: Contiguous axial images were obtained from the base of the skull through the vertex without intravenous contrast. RADIATION DOSE REDUCTION: This exam was performed according to the departmental dose-optimization program which includes automated exposure control, adjustment of the mA and/or kV according to patient size and/or use of iterative reconstruction technique.  COMPARISON:  None Available. FINDINGS: Brain: No evidence of acute infarction, hemorrhage, hydrocephalus, extra-axial collection or mass lesion/mass effect. Vascular: Negative for hyperdense vessel Skull: Negative Sinuses/Orbits: Paranasal sinuses clear.  Negative orbit Other: None IMPRESSION: Negative CT head Electronically Signed   By: Franchot Gallo M.D.   On: 07/02/2021 15:52  ? ?MR ANGIO HEAD WO CONTRAST ? ?Result Date: 07/02/2021 ?CLINICAL DATA:  Initial evaluation for acute headache, currently pregnant. EXAM: MRI HEAD WITHOUT CONTRAST MRA HEAD WITHOUT CONTRAST MRV HEAD WITHOUT CONTRAST TECHNIQUE: Multiplanar, multi-echo pulse sequences of the brain and surrounding structures were acquired without intravenous contrast. Angiographic images of the Circle of Willis were acquired using MRA technique without intravenous contrast. Additional dedicated MRV images without contrast were also obtained. COMPARISON:  Prior CT from earlier the same day. FINDINGS: MRI HEAD FINDINGS Brain: Examination mildly degraded by motion. Cerebral volume within normal limits. No focal parenchymal signal abnormality. No evidence for acute or subacute infarct. Gray-white matter differentiation maintained. No areas of chronic cortical infarction. No acute or chronic intracranial blood products. No mass lesion, midline shift or mass effect. No hydrocephalus or extra-axial fluid collection. Pituitary gland suprasellar region normal. Midline structures intact and normally formed. Vascular: Major intracranial vascular flow voids are well maintained. Skull and upper cervical spine: Craniocervical junction within normal limits. Bone marrow signal intensity normal. No scalp soft tissue abnormality. Sinuses/Orbits: Globes and orbital soft tissues within normal limits. Mild scattered mucosal thickening noted about the ethmoidal air cells and maxillary sinuses. Paranasal sinuses are otherwise clear. No significant mastoid effusion. Other: None. MRA  HEAD FINDINGS Anterior circulation: Both internal carotid arteries widely patent to the termini without stenosis. A1 segments widely patent. Normal anterior communicating artery complex. Both anterior cerebral arteries widely patent to their distal aspects without stenosis. No M1 stenosis or occlusion. Normal MCA bifurcations. Distal MCA branches well perfused and symmetric. Posterior circulation: Both V4 segments widely patent without stenosis. Right PICA patent. Left PICA origin not visualized. Basilar patent without stenosis. Superior cerebral arteries patent bilaterally. Right PCA supplied via the basilar. Left PCA supplied via a hypoplastic left P1 segment and robust left posterior communicating artery. Both PCAs well perfused or distal aspects without stenosis. Anatomic variants: As above. No intracranial aneurysm or other vascular malformation. MRV HEAD FINDINGS Normal flow related signal seen throughout the superior sagittal sinus to the torcula. Transverse and sigmoid sinuses are patent as are the jugular bulbs and visualized proximal internal jugular veins. Straight sinus, vein of Galen, internal cerebral veins, basal veins of  Rosenthal appear patent. No appreciable abnormality about the cavernous sinus. No visible cortical vein thrombosis. No dural venous sinus thrombosis. IMPRESSION: 1. Normal MRI of the brain. No acute intracranial abnormality identified. 2. Normal intracranial MRA. 3. Normal intracranial MRV. No evidence for dural venous sinus thrombosis. Electronically Signed   By: Jeannine Boga M.D.   On: 07/02/2021 19:32  ? ?MR BRAIN WO CONTRAST ? ?Result Date: 07/02/2021 ?CLINICAL DATA:  Initial evaluation for acute headache, currently pregnant. EXAM: MRI HEAD WITHOUT CONTRAST MRA HEAD WITHOUT CONTRAST MRV HEAD WITHOUT CONTRAST TECHNIQUE: Multiplanar, multi-echo pulse sequences of the brain and surrounding structures were acquired without intravenous contrast. Angiographic images of the Circle  of Willis were acquired using MRA technique without intravenous contrast. Additional dedicated MRV images without contrast were also obtained. COMPARISON:  Prior CT from earlier the same day. FINDINGS: MRI

## 2021-07-04 NOTE — Progress Notes (Signed)
Pt having a panic attack. Dr. Damita Dunnings notified. Order for '1mg'$  received and given. Will continue to monitor. Krystal [name] L Wenzel Backlund  ?

## 2021-07-05 ENCOUNTER — Ambulatory Visit: Payer: Medicaid Other | Admitting: Cardiology

## 2021-07-05 DIAGNOSIS — O10912 Unspecified pre-existing hypertension complicating pregnancy, second trimester: Secondary | ICD-10-CM | POA: Diagnosis not present

## 2021-07-05 DIAGNOSIS — R112 Nausea with vomiting, unspecified: Secondary | ICD-10-CM

## 2021-07-05 LAB — BASIC METABOLIC PANEL
Anion gap: 9 (ref 5–15)
BUN: 6 mg/dL (ref 6–20)
CO2: 21 mmol/L — ABNORMAL LOW (ref 22–32)
Calcium: 8.8 mg/dL — ABNORMAL LOW (ref 8.9–10.3)
Chloride: 108 mmol/L (ref 98–111)
Creatinine, Ser: 0.75 mg/dL (ref 0.44–1.00)
GFR, Estimated: >60 mL/min (ref >60)
Glucose, Bld: 158 mg/dL — ABNORMAL HIGH (ref 70–99)
Potassium: 3.5 mmol/L (ref 3.5–5.1)
Sodium: 138 mmol/L (ref 135–145)

## 2021-07-05 MED ORDER — SODIUM CHLORIDE 0.9 % IV SOLN
8.0000 mg | Freq: Three times a day (TID) | INTRAVENOUS | Status: DC
  Administered 2021-07-05 – 2021-07-11 (×14): 8 mg via INTRAVENOUS
  Filled 2021-07-05 (×27): qty 4

## 2021-07-05 MED ORDER — VENLAFAXINE HCL ER 75 MG PO CP24
225.0000 mg | ORAL_CAPSULE | Freq: Every day | ORAL | Status: DC
  Administered 2021-07-05 – 2021-07-20 (×15): 225 mg via ORAL
  Filled 2021-07-05 (×17): qty 1

## 2021-07-05 MED ORDER — CLONAZEPAM 0.5 MG PO TABS
0.5000 mg | ORAL_TABLET | Freq: Two times a day (BID) | ORAL | Status: DC
  Administered 2021-07-05 – 2021-07-07 (×4): 0.5 mg via ORAL
  Filled 2021-07-05 (×4): qty 1

## 2021-07-05 MED ORDER — BOOST / RESOURCE BREEZE PO LIQD CUSTOM
1.0000 | Freq: Three times a day (TID) | ORAL | Status: DC
  Administered 2021-07-05 – 2021-07-15 (×7): 1 via ORAL
  Filled 2021-07-05 (×32): qty 1

## 2021-07-05 MED ORDER — FAMOTIDINE IN NACL 20-0.9 MG/50ML-% IV SOLN
20.0000 mg | Freq: Two times a day (BID) | INTRAVENOUS | Status: DC
  Administered 2021-07-05 – 2021-07-12 (×16): 20 mg via INTRAVENOUS
  Filled 2021-07-05 (×17): qty 50

## 2021-07-05 MED ORDER — HYDROMORPHONE HCL 1 MG/ML IJ SOLN
1.0000 mg | Freq: Once | INTRAMUSCULAR | Status: AC
  Administered 2021-07-05: 1 mg via INTRAVENOUS
  Filled 2021-07-05: qty 1

## 2021-07-05 MED ORDER — TRIMETHOBENZAMIDE HCL 100 MG/ML IM SOLN
200.0000 mg | Freq: Three times a day (TID) | INTRAMUSCULAR | Status: DC | PRN
  Administered 2021-07-05 – 2021-07-17 (×10): 200 mg via INTRAMUSCULAR
  Filled 2021-07-05 (×13): qty 2

## 2021-07-05 MED ORDER — ONDANSETRON 4 MG PO TBDP
8.0000 mg | ORAL_TABLET | Freq: Three times a day (TID) | ORAL | Status: DC
  Administered 2021-07-06 – 2021-07-16 (×18): 8 mg via ORAL
  Filled 2021-07-05 (×22): qty 2

## 2021-07-05 MED ORDER — PANTOPRAZOLE SODIUM 40 MG IV SOLR
40.0000 mg | Freq: Every day | INTRAVENOUS | Status: DC
  Administered 2021-07-05 – 2021-07-12 (×8): 40 mg via INTRAVENOUS
  Filled 2021-07-05 (×8): qty 10

## 2021-07-05 MED ORDER — METHYLPREDNISOLONE SODIUM SUCC 125 MG IJ SOLR
125.0000 mg | Freq: Two times a day (BID) | INTRAMUSCULAR | Status: AC
  Administered 2021-07-05 – 2021-07-09 (×9): 125 mg via INTRAVENOUS
  Filled 2021-07-05 (×9): qty 2

## 2021-07-05 MED ORDER — BETAMETHASONE SOD PHOS & ACET 6 (3-3) MG/ML IJ SUSP
12.0000 mg | INTRAMUSCULAR | Status: AC
  Administered 2021-07-05 – 2021-07-06 (×2): 12 mg via INTRAMUSCULAR
  Filled 2021-07-05: qty 5

## 2021-07-05 NOTE — Progress Notes (Signed)
Addi Delma Freeze, Charge Nurse '@bedside'$  and neuro exam completed. Exam unremarkable. Toya Smothers, RN  ?

## 2021-07-05 NOTE — Progress Notes (Signed)
? ?Progress Note ? ?Patient Name: Krystal [name] ?Date of Encounter: 07/05/2021 ? ?[facility] Cardiologist: Freada Bergeron, MD  ? ?Subjective  ? ?Continues to have headache. Nausea still present but she can keep some food and pills.  ? ?Inpatient Medications  ?  ?Scheduled Meds: ? amLODipine  10 mg Oral Daily  ? aspirin EC  162 mg Oral Daily  ? famotidine  20 mg Oral BID  ? feeding supplement  1 Container Oral TID BM  ? hydrALAZINE  100 mg Oral TID  ? labetalol  200 mg Oral TID  ? levothyroxine  125 mcg Oral QAC breakfast  ? loratadine  10 mg Oral Daily  ? LORazepam  1 mg Intravenous Once  ? magnesium oxide  400 mg Oral BID  ? methylPREDNISolone (SOLU-MEDROL) injection  16 mg Intravenous Q8H  ? pantoprazole (PROTONIX) IV  40 mg Intravenous Daily  ? prenatal multivitamin  1 tablet Oral Q1200  ? scopolamine  1 patch Transdermal Q72H  ? venlafaxine XR  150 mg Oral Daily  ? ?Continuous Infusions: ? lactated ringers 150 mL/hr at 07/05/21 1116  ? ondansetron (ZOFRAN) IV 8 mg (07/05/21 1123)  ? promethazine (PHENERGAN) injection (IM or IVPB) 25 mg (07/05/21 0820)  ? ?PRN Meds: ?acetaminophen, albuterol, calcium carbonate, cyclobenzaprine, diphenhydrAMINE, haloperidol lactate, labetalol **AND** labetalol **AND** labetalol **AND** hydrALAZINE **AND** Measure blood pressure, hydrOXYzine, ondansetron (ZOFRAN) IV **OR** ondansetron, promethazine (PHENERGAN) injection (IM or IVPB), zolpidem  ? ?Vital Signs  ?  ?Vitals:  ? 07/04/21 1939 07/04/21 2351 07/05/21 0340 07/05/21 0755  ?BP: (!) 157/81 (!) 157/90 (!) 151/87 (!) 149/89  ?Pulse: 77 85 89 90  ?Resp: '19 18 18 16  '$ ?Temp: 98 ?F (36.7 ?C) 98.2 ?F (36.8 ?C) 98.3 ?F (36.8 ?C) 97.9 ?F (36.6 ?C)  ?TempSrc: Oral Oral Oral Oral  ?SpO2:  98% 98% 97%  ?Height:      ? ? ?Intake/Output Summary (Last 24 hours) at 07/05/2021 1231 ?Last data filed at 07/05/2021 1013 ?Gross per 24 hour  ?Intake 108 ml  ?Output 100 ml  ?Net 8 ml  ? ? ?  06/19/2021  ?  9:07 AM 05/23/2021  ? 10:37 AM  05/08/2021  ?  1:27 PM  ?Last 3 Weights  ?Weight (lbs) 227 lb 9.6 oz 223 lb 218 lb 1.6 oz  ?Weight (kg) 103.239 kg 101.152 kg 98.93 kg  ?   ? ?Telemetry  ?  ?Not on telemetry - Personally Reviewed ? ?ECG  ?  ?No new since 5/4 - Personally Reviewed ? ?Physical Exam  ? ?GEN: No acute distress.   ?Neck: No JVD ?Cardiac: RRR, no murmurs, rubs, or gallops.  ?Respiratory: Clear to auscultation bilaterally. ?GI: Soft, nontender, non-distended  ?MS: No edema; No deformity. ?Neuro:  Nonfocal  ?Psych: Normal affect  ? ?Labs  ?  ?High Sensitivity Troponin:   ?Recent Labs  ?Lab 06/27/21 ?1637  ?TROPONINIHS 12  ?   ?Chemistry ?Recent Labs  ?Lab 07/02/21 ?1507 07/03/21 ?1228 07/05/21 ?1043  ?NA 137 134* 138  ?K 3.3* 3.4* 3.5  ?CL 106 105 108  ?CO2 24 23 21*  ?GLUCOSE 79 108* 158*  ?BUN '13 8 6  '$ ?CREATININE 0.68 0.63 0.75  ?CALCIUM 8.9 8.8* 8.8*  ?PROT 6.0*  --   --   ?ALBUMIN 3.3*  --   --   ?AST 20  --   --   ?ALT 18  --   --   ?ALKPHOS 51  --   --   ?BILITOT  0.8  --   --   ?GFRNONAA >60 >60 >60  ?ANIONGAP '7 6 9  '$ ?  ?Lipids No results for input(s): CHOL, TRIG, HDL, LABVLDL, LDLCALC, CHOLHDL in the last 168 hours.  ?Hematology ?Recent Labs  ?Lab 07/02/21 ?1507  ?WBC 8.7  ?RBC 3.64*  ?HGB 12.0  ?HCT 33.6*  ?MCV 92.3  ?MCH 33.0  ?MCHC 35.7  ?RDW 11.9  ?PLT 202  ? ?Thyroid  ?Recent Labs  ?Lab 07/03/21 ?1228  ?TSH 2.154  ?FREET4 0.89  ?  ?BNPNo results for input(s): BNP, PROBNP in the last 168 hours.  ?DDimer No results for input(s): DDIMER in the last 168 hours.  ? ?Radiology  ?  ?Fetal echo reviewed ? ?Cardiac Studies  ? ?Echo 06/17/21 ?1. Global longitudinal strain is -20%. Left ventricular ejection  ?fraction, by estimation, is 60 to 65%. The left ventricle has normal  ?function. The left ventricle has no regional wall motion abnormalities.  ?There is mild left ventricular hypertrophy.  ?Left ventricular diastolic parameters were normal.  ? 2. Right ventricular systolic function is normal. The right ventricular  ?size is normal.  ?  3. Mild mitral valve regurgitation.  ? 4. The aortic valve is tricuspid. Aortic valve regurgitation is not  ?visualized. Aortic valve sclerosis is present, with no evidence of aortic  ?valve stenosis.  ? 5. The inferior vena cava is normal in size with greater than 50%  ?respiratory variability, suggesting right atrial pressure of 3 mmHg.  ? ?Patient Profile  ?   ?31 y.o. 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 P/C 0.11 without pre-elampsia currently. Cardiology is consulted for further management of severe HTN. ? ?Assessment & Plan  ?  ?#Chronic HTN: ?#Hypertension in Pregnancy: ?#History of Pre-Eclampsia and HELLP Syndrome: ?-BP control has been limited by persistent nausea and vomiting with inability to keep PO medications down. When she tolerates PO, her blood pressures are well controlled and at goal with her regimen.  ?-Overall goal is to improve her nausea/emesis so that she can tolerate her PO medications. ?-Continue steroids to try to improve nausea/vomiting to allow her to tolerate PO medications. She has not noted improvement yet. ?-Decreased labetolol to '200mg'$  TID given that higher doses worsen her nausea; can always increase back if needed ?-Continue amlodipine '10mg'$  daily ?-Continue hydralazine '100mg'$  TID ?-Continue IV labetalol pushes as needed ?-Did not tolerate nifedipine due to "jitteriness" ?-Not a good candidate for HCTZ due to risk of volume depletion and hypoK with nausea and vomiting ?-Methyldopa not available in [place] ?-Very high risk for developing pre-eclampsia/HELLP again ?-May ultimately require prolonged hospitalization for nausea/vomiting and BP control ?   ?#Hypokalemia: ?Secondary to persistent nausea/vomiting ?  ?#Headache ?CT/MRI/MRV head without evidence of stroke. 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 Now on steroids. ?-Management per OBGYN and Neurology ? ?For questions or updates, please contact [place] ?Please consult [email or web address] for contact info under  ? ?  ?   ?Signed, ?Buford Dresser, MD  ?07/05/2021, 12:31 PM    ?

## 2021-07-05 NOTE — Consult Note (Signed)
Brief Psychiatry Consult Note  ?Consulted by Dr. Damita Dunnings and case discussed over phone. Briefly, this is a pt with hx depression and anxiety with complicated social situation (parent on hospice) admitted for severe range blood pressures at 23 weeks, baby with known tetrology of fallot (d/w Dr. Damita Dunnings - no additional restrictions on meds for mother beyond normal pregnancy restrictions). Multiple prior preterm deliveries for pre-E/HEELP. Has severe anxiety, panicking every time someone goes in the room. On venlafaxine and hydroxyzine. Trialled 1 mg ativan before consult which was overly sedating ? ?- INCREASE venlafaxine to 187.5 with goal dose around 225 as long as previously effective (pt in 2nd trimester pregnancy, likely declining blood concentration 2/2 increased blood volume and hepatic metabolism) ?-- START clonazepam 0.25 scheduled BID (not PRN) ? ?To be seen for full consult tomorrow ? ?Coline Calkin A [place] Daywalt ? ?

## 2021-07-05 NOTE — Progress Notes (Signed)
Ophtho consult called - spoke with office.   ?Psych consult called - Spoke with Dr. Lovette Cliche ?Neuro consult called - checked with stroke team APP - recommended neuro consult (given recent negative w/u for stroke). Neuro consult called to [facility] x[facility], text page sent and another call placed with voicemails left with both calls.  ? ?Radene Gunning, MD ?Attending [place], Faculty Practice ?[facility] for [place] ? ? ?

## 2021-07-05 NOTE — Assessment & Plan Note (Addendum)
31  Year old [id number] at 36w6dwho was admitted for HTN/migraine and intractable nausea and vomiting. We were asked to consult for intractable nausea and vomiting that is unfortunately exacerbating her HTN.  ?-she states related to her pregnancy, but this is worse than it's ever been  ?-allergy to reglan and compazine  ?-cmp pending for today, but normal hepatic panel a few days ago. negative murphy's sign. She does have a RUQ [place]ending  ?-? If medication induced gastroparesis with CCB. Will check ANA to r/o autoimmune  ?-continue zofran. Discussed with pharmacy and will do trial of tigan and only use phenergan for refractory N/V.  ?-Ativan may be helpful as well and she thinks it may have helped prior to her imaging. Psychiatry currently has her on klonopin scheduled; however, she states she threw it right up. Would discuss with team tomorrow about trial of low dose ativan instead of klonopin for short period to see if has some positive effect with her emesis and can be given IV  ?

## 2021-07-05 NOTE — Consult Note (Signed)
Asked by Dr.Duncan to provide prenatal consultation for 31 y.o. G5 P0-2-2-2 who is now 23.[redacted] weeks EGA, with pregnancy complicated by chronic HTN, significant anxiety, and FGR, also fetal echo shows  Tetralogy of Fallot. Dr. Damita Dunnings is seeking NICU input regarding delivery here vs prenatal transfer to [facility], although exact delivery plan is not yet established. Current goal is to control mother's HTN and anxiety, no immediate concerns about fetal well being at this time. ? ?I discussed this with Dr. Renie Ora (cardiology) who notes uncertainty about the degree of pulmonary outflow obstruction - pulmonary valve only slightly smaller than normal but is dysplastic and thick, therefore  functionality is difficult to predict. She would recommend transfer to [place] if delivery is expected within the next 3 - 4 wks to facilitate cardiology management, although surgical or intravascular interventions would not be an option until she is much bigger. ? ?I discussed the various scenarios/options with Krystal [name] and also Krystal [name] (via phone); explaining that their baby would need CT surgery intervention eventually but probably not for weeks/months and certainly not within the first few days after delivery. I told them that most likely she could be managed appropriately here at [facility] but that there was some chance that urgent transfer to [place] would be indicated. They prefer for mother to remain here for numerous personal reasons, including the mother's mental health status which would be exacerbated. by transfer away from Krystal family.  ? ?I am therefore suggesting that transfer be deferred at this time pending further observation of maternal and fetal status. This should be re-addressed when a more specific delivery plan is established. ? ?Patient and FOB were attentive, had appropriate questions, and were appreciative of my input. ? ?Thank you for consulting Neonatology. ? ?Total time 60 minutes, face-to-face time with patient - 20  minutes ? ?JWimmer, MD ? ?

## 2021-07-05 NOTE — Progress Notes (Signed)
Neuro exam performed, patient A+Ox4.  Cranial nerves grossly intact. Deviation of pupils noted R>L. Pt complains of blurred vision worse on right side and generalized headache.  ?

## 2021-07-05 NOTE — Assessment & Plan Note (Signed)
Per cardiology/OBGYN  ?

## 2021-07-05 NOTE — Progress Notes (Signed)
Dr. Damita Dunnings notified in regards to patient reporting a panic attack as well as blurred vision in her right eye. BP 180/106. Instructed by Dr. Damita Dunnings to start Labetalol protocol. '20Mg'$  of IV Labetalol pushed IV. Toya Smothers, RN  ? ?

## 2021-07-05 NOTE — Progress Notes (Signed)
PM check on patient.  ? ?She is still with emesis (had just come out of the bathroom). HA improved s/p dilaudid. Still with blurry vision in right eye with persistent difference in dilation. No other focal deficits. Anxiety attack has passed.  ? ?Today's Vitals  ? 07/05/21 1259 07/05/21 1415 07/05/21 1437 07/05/21 1500  ?BP:  (!) 180/106 (!) 160/89 (!) 148/86  ?Pulse:  (!) 103 91 80  ?Resp:  '18 18 18  '$ ?Temp:  97.9 ?F (36.6 ?C)    ?TempSrc:  Oral    ?SpO2:  99% 99% 99%  ?Height:      ?PainSc: 4      ? ?Blood pressure now improved s/p labetalol protocol.  ? ?Gen: NAD ?Pulm: Normal respiratory effort ?CV: RRR ?HEENT: pupils different sizes R>L again ? ?A/P:  ?Persistent N/V ?- Scheduled anti-emetics along with pantoprazole, pepcid. Has allergies/intolerances to some ?- Continue steroids, may need to consider higher dose ?- RUQ [place] (pt is s/p lap chole) ?- Recheck labs in the morning (PreE labs, P/C ratio, lipase) ?- TSH wnl ? ?Headache/Aniscoria ?- Will appreciate Neuro input (consulted again as noted) for other etiologies for persistent recurrent HA in the absence of preeclampsia ?- Consulted Ophtho as noted in prior note ? ?Anxiety/panic attacks ?- Consulted psychiatry - appreciate Dr. Little Ishikawa notes/recommendations ? ?Fetal Well being ?- Appreciate Dr. Felix Pacini input and discussion with patient.  ?- Started [place] course today and will start NST tomorrow ? ?5. Labile HTN ?- Will continue current medications and see if control of anxiety aids this as well (elevated BP did coincide with panic attack).  ?- Will continue current regimen.  ? ?Dispo: Discussed potential need for long-term admission. Pt voices understanding and will do what is best for her health and fetal well being.  ? ?Radene Gunning, MD ?Attending [place], Faculty Practice ?Center for [place] ? ? ? ?

## 2021-07-05 NOTE — Consult Note (Signed)
Initial Consultation Note ? ? ?Patient: Krystal [name]:[id number] DOB: 08/26/1990 PCP: Audley Hose, MD ?DOA: 07/02/2021 ?DOS: the patient was seen and examined on 07/05/2021 ?Primary service: Radene Gunning, MD ? ?Referring physician: Dr. Damita Dunnings ?Reason for consult: intractable N/V  ? ?Assessment/Plan: ?Assessment and Plan: ?* Intractable nausea and vomiting ?31  Year old [id number] at 65w6dwho was admitted for HTN/migraine and intractable nausea and vomiting. We were asked to consult for intractable nausea and vomiting that is unfortunately exacerbating her HTN.  ?-she states related to her pregnancy, but this is worse than it's ever been  ?-allergy to reglan and compazine  ?-cmp pending for today, but normal hepatic panel a few days ago. negative murphy's sign. She does have a RUQ [place]ending  ?-? If medication induced gastroparesis with CCB. Will check ANA to r/o autoimmune  ?-continue zofran. Discussed with pharmacy and will do trial of tigan and only use phenergan for refractory N/V.  ?-Ativan may be helpful as well and she thinks it may have helped prior to her imaging. Psychiatry currently has her on klonopin scheduled; however, she states she threw it right up. Would discuss with team tomorrow about trial of low dose ativan instead of klonopin for short period to see if has some positive effect with her emesis and can be given IV  ? 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  ?3rd nerve palsy ruled out with imaging  ? ?Chronic hypertension with exacerbation during pregnancy in second trimester ?Per cardiology/OBGYN  ? ?Headache in pregnancy, antepartum, second trimester ?Negative stroke w/u ?Neurology following  ? ? ? ? ?TR[facility] will continue to follow the patient. ? ?HPI: Krystal [name] is a 31y.o. female with past medical history of uncontrolled HTN, anxiety, asthma, hx of HELLP who is a [id number] 278w6ddmitted by OB/GYN service for headache/HTN on  07/02/2021. Neurology was consulted to rule out stroke with HTN and anisocoria as well as cardiology for help with blood pressure management. 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  Due to persistent headache, neurology consulted again today as well as ophthalmology for anisocoria. [facility] was asked to consult for intractable nausea and vomiting.  ? ?She states she has had N/V for entire pregnancy. Her nausea and vomiting has actually improved in the hospital compared to being at home. She is able to keep a little bit down. She has had 5 episodes of emesis today. When she is not pregnancy she has no issues with nausea/vomiting. She states she is having issues keeping down her pills.  ? ?She states baby is moving, no contractions, no vaginal fluid leaking, no bloody discharge.  ? ?Review of Systems: As mentioned in the history of present illness. All other systems reviewed and are negative. ?Past Medical History:  ?Diagnosis Date  ? Acute acalculous cholecystitis s/p lap cholecystectomy 05/11/2020 05/11/2020  ? Angio-edema 01/15/2021  ? Anxiety   ? Asthma   ? 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 IC (interstitial cystitis)   ? Kidney stones   ? Migraines   ? Necrotizing fasciitis (HCAn[place]  ? left hand amputation  ? Severe uncontrolled hypertension 10/08/2017  ? UTI (urinary tract infection)   ? ?Past Surgical History:  ?Procedure Laterality Date  ? APPENDECTOMY    ? BIOPSY  05/10/2020  ? Procedure: BIOPSY;  Surgeon: OuArta Silence  MD;  Location: [facility];  Service: Endoscopy;;  ? CESAREAN SECTION N/A 12/06/2017  ? Procedure: CESAREAN SECTION;  Surgeon: Sloan Leiter, MD;  Location: [place];  Service: Obstetrics;  Laterality: N/A;  ? CHOLECYSTECTOMY N/A 05/11/2020  ? Procedure: SINGLE SITE LAPAROSCOPIC CHOLECYSTECTOMY AND LIVER BIOSY;  Surgeon: Michael Boston, MD;   Location: [facility];  Service: General;  Laterality: N/A;  ? ESOPHAGOGASTRODUODENOSCOPY (EGD) WITH PROPOFOL N/A 05/10/2020  ? Procedure: ESOPHAGOGASTRODUODENOSCOPY (EGD) WITH PROPOFOL;  Surgeon: Arta Silence, MD;  Location: [facility];  Service: Endoscopy;  Laterality: N/A;  ? 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 HAND RECONSTRUCTION Right   ? INCISION AND DRAINAGE    ? NASAL SEPTUM SURGERY    ? ?Social History:  reports that she has never smoked. She has never used smokeless tobacco. She reports that she does not currently use alcohol. She reports that she does not use drugs. ? ?Allergies  ?Allergen Reactions  ? Red Dye Other (See Comments)  ?  ALL CAUSE PROBLEMS ?#40 IS WORST-MIGRAINES   ? Compazine [Prochlorperazine Maleate] Other (See Comments)  ?  TWITCHING, CANT STAY STILL. Pt states she can tolerate promethazine  ? Other Other (See Comments)  ?  FANSIDAR FOR MALARIA-ASTHMA  ? Vicodin [Hydrocodone-Acetaminophen] Itching, Nausea And Vomiting and Other (See Comments)  ?  Pt states she can tolerate acetaminophen  ? Amitriptyline Hcl Other (See Comments)  ?  Angry moods  ? Duloxetine Hcl Other (See Comments)  ?  Passing out, trimble, irritable  ? Reglan [Metoclopramide] Other (See Comments)  ?  restless  ? Nifedipine Nausea And Vomiting and Other (See Comments)  ?  Discussed with patient and maternal fetal medicine, patient has intolerance but not frank allergy.  ? ? ?Family History  ?Problem Relation Age of Onset  ? Hypertension Mother   ? Hypertension Father   ? ? ?Prior to Admission medications   ?Medication Sig Start Date End Date Taking? Authorizing Provider  ?acetaminophen (TYLENOL) 500 MG tablet Take 2 tablets (1,000 mg total) by mouth every 6 (six) hours as needed. 05/14/20   Bonnielee Haff, MD  ?albuterol (PROVENTIL HFA;VENTOLIN HFA) 108 (90 Base) MCG/ACT inhaler Inhale 2 puffs into the lungs every 6 (six) hours as needed for wheezing or shortness of breath.    [provider]   ?amLODipine (NORVASC) 10 MG tablet Take 1 tablet (10 mg total) by mouth daily. 05/31/21   Freada Bergeron, MD  ?aspirin EC 81 MG tablet Take 1 tablet (81 mg total) by mouth daily. Take after 12 weeks for prevention of preeclampsia later in pregnancy 04/01/21   Chancy Milroy, MD  ?budesonide-formoterol [facility]) 80-4.5 MCG/ACT inhaler Inhale 2 puffs into the lungs in the morning and at bedtime. Take for 1-2 weeks at a time during asthma flare. Rinse mouth after each use. 03/26/21   Garnet Sierras, DO  ?cetirizine (ZYRTEC ALLERGY) 10 MG tablet Take 1 tablet (10 mg total) by mouth 2 (two) times daily. 03/26/21   Garnet Sierras, DO  ?cholecalciferol (VITAMIN D3) 25 MCG (1000 UNIT) tablet Take 1,000 Units by mouth daily.    [provider]  ?EPINEPHrine 0.3 mg/0.3 mL IJ SOAJ injection Inject 0.3 mg into the muscle as needed for anaphylaxis. 01/15/21   Garnet Sierras, DO  ?famotidine (PEPCID) 20 MG tablet Take 1 tablet (20 mg total) by mouth 2 (two) times daily. 03/26/21   Garnet Sierras, DO  ?hydrALAZINE (APRESOLINE) 100  MG tablet Take 1 tablet (100 mg total) by mouth 3 (three) times daily. 05/14/20   Bonnielee Haff, MD  ?hydrOXYzine (VISTARIL) 25 MG capsule Take 1-2 capsules (25-50 mg total) by mouth 3 (three) times daily as needed for itching. 05/08/21   Rasch, Anderson Malta I, NP  ?labetalol (NORMODYNE) 300 MG tablet Take 0.5 tablets (150 mg total) by mouth 3 (three) times daily. 05/31/21   Freada Bergeron, MD  ?levothyroxine (SYNTHROID) 125 MCG tablet Take 1 tablet (125 mcg total) by mouth daily before breakfast. 05/24/21   Philemon Kingdom, MD  ?ondansetron (ZOFRAN-ODT) 8 MG disintegrating tablet Take 1 tablet (8 mg total) by mouth every 8 (eight) hours as needed for nausea or vomiting. 05/08/21   Rasch, Anderson Malta I, NP  ?pantoprazole (PROTONIX) 40 MG tablet Take 1 tablet (40 mg total) by mouth daily. 06/13/21   Woodroe Mode, MD  ?potassium chloride SA (KLOR-CON M) 20 MEQ tablet Take 1 tablet (20 mEq total) by mouth  daily for 7 days. 05/08/21 08/07/22  Rasch, Artist Pais, NP  ?Prenatal Multivit-Min-Fe-FA (PRE-NATAL PO) Take by mouth.    [provider]  ?promethazine (PHENERGAN) 25 MG tablet Take 1 tablet (25 mg tot

## 2021-07-05 NOTE — Consult Note (Signed)
OPHTHALMOLOGY CONSULT NOTE ? ?Subjective: ?Patient is a 31 y.o female currently [redacted] weeks pregnant admitted for nausea and vomiting. Ophthalmology was consulted for anisocoria OD>OS. Patient reports that 3 days ago her eyes were dilated and her vision blurred and gradually symptoms improved. Then this morning symptoms recurred. Patient has a scopolamine patch behind her left ear. Denies touching the patch and even forgot that it was even there. States nurse places the patch. She wears glasses but they are broken and at home. ? ?Objective: ?Vital signs in last 24 hours: ?Temp:  [97.9 ?F (36.6 ?C)-98.3 ?F (36.8 ?C)] 97.9 ?F (36.6 ?C) (05/12 1415) ?Pulse Rate:  [77-103] 80 (05/12 1500) ?Resp:  [16-19] 18 (05/12 1500) ?BP: (148-180)/(81-106) 148/86 (05/12 1500) ?SpO2:  [97 %-99 %] 99 % (05/12 1500) ?Weight change:  ?Last BM Date : 07/02/21 ? ?Intake/Output from previous day: ?No intake/output data recorded. ?Intake/Output this shift: ?Total I/O ?In: 108 [IV Piggyback:108] ?Out: 100 [Emesis/NG output:100] ? ?~~~EYE EXAM~~~ ? ?Visual acuity (near): ? Right eye without correction: 20/100    Left eye without correction: 20/50 ? Right eye without correction pinhole: 20/70   Left eye without correction pinhole: 20/40 ? Right eye with +1.00: 20/40 + 2    Left eye with +1.00: 20/30 ?  ?Intraocular pressure: OD 64mHg OS 123mg ?Extraocular movement: Full ?Cover-uncover testing: mild exophoria ? ?External exam: ? Lids and lashes clean OU ? No ptosis OU ? Adnexa normal OU ? Pupils: Right eye 5.5 mm dilated without reaction, Left eye 3.5 mm to 3.0 mm, brisk reaction. No APD ? ?Anterior exam: ?    ?Conjunctiva:         Right eye: White and quiet         Left eye: White and quiet      ?Cornea:                   Right eye: Clear                         Left eye: clear ?Anterior chamber:   Right eye: Deep                          Left eye: Deep ?Iris:          Right eye: grossly normal  Left eye: grossly normal ? ? ?Fundus exam  (dilation deferred OS): ? ?Right Disc: C/D 0.75 sharp, no disc edema ?Right Macula: good foveal reflex, without heme or edema ?Right Vessels: normal course and caliber ?Right Periphery: attached. No tears/detachment. ? ?Recent Labs  ?  07/03/21 ?1228 07/05/21 ?1043  ?NA 134* 138  ?K 3.4* 3.5  ?CL 105 108  ?CO2 23 21*  ?BUN 8 6  ?CREATININE 0.63 0.75  ? ? ? ?Medications: I have reviewed the patient's current medications. ? ?Assessment/Plan: ? ?Anisocoria ?-Greater in light than in dark ?-Right pupil dilated and non-reactive ?-With blurred vision ?-No ptosis ?-Extraocular movement intact with exophoria ?-Tested 1% pilocarpine- no reaction. Pupil remained dilated at 5.5 mm which points to pharmacodilation. ?-Ddx: Pharmacodilation from scopolamine patch - highest on the differential, scopolamine causes dilation for 3-5 days. First episode of dilation occurred when first patch was placed, second episode started this morning and her patch was replaced last night. Neuroimaging negative for 3rd nerve palsy and extraocular movements intact. Migraine- patient denies migraines recently but mydriasis can occur with migraines. Adie's pupil. ?-Ok to continue scopolamine but  may have continued pupil dilation. If pupils do not return to normal after 5 days off patch may be Adie's pupil which is benign. Can test for Adie's in clinic with 0.125% pilocarpine, not available today. Already ruled out 3rd nerve palsy by imaging which is the most concerning cause of dilated pupil/anisocoria. ? ?Refractive error, hyperope ?-Patient wears glasses. With dilation patient has loss the ability to accommodate which would make her vision blurry. The dilation would also allow more light rays to enter the eye and make vision blurry.  ?-Recommend OTC +0.75 or +1.00 temporarily. Pregnancy can cause change in refraction/prescription and would not recommend new prescription glasses at this time. ? ?Ophthalmology will sign off. Please reconsult if any  concern or new symptoms arise. ? ? ? LOS: 3 days  ? ?Haylei Cobin T Yareliz Thorstenson ?07/05/2021 ? ? ?

## 2021-07-05 NOTE — Assessment & Plan Note (Signed)
Negative stroke w/u ?Neurology following  ?

## 2021-07-05 NOTE — Progress Notes (Signed)
40 mg Labetalol given. BP 160/89. Dr. Damita Dunnings is aware. Toya Smothers, RN  ?

## 2021-07-05 NOTE — Assessment & Plan Note (Addendum)
ophthomology consulted and believed medication induced from scopolamine.  ?3rd nerve palsy ruled out with imaging  ?

## 2021-07-05 NOTE — Progress Notes (Signed)
FACULTY PRACTICE ANTEPARTUM COMPREHENSIVE PROGRESS NOTE ? ?Krystal [name] is a 31 y.o. [id number] at 34w6dwho is admitted for management of refractory HTN.  Estimated Date of Delivery: 10/26/21 ?Fetal presentation is cephalic. ? ?Length of Stay:  3 Days. Admitted 07/02/2021 ? ?Subjective: ?HA was gone yesterday but has slowly returned overnight. It was a 3/10 this morning but slowly worsening.  ? ?Patient reports good fetal movement.  She reports no uterine contractions, no bleeding and no loss of fluid per vagina. ? ?Vitals:  Blood pressure (!) 149/89, pulse 90, temperature 97.9 ?F (36.6 ?C), temperature source Oral, resp. rate 16, height '5\' 11"'$  (1.803 m), last menstrual period 01/19/2021, SpO2 97 %, not currently breastfeeding. ?Physical Examination: ?CONSTITUTIONAL: Well-developed, well-nourished female in no acute distress but at times wincing from HA ?NEUROLOGIC: Alert and oriented to person, place, and time. No cranial nerve deficit noted. ?PSYCHIATRIC: Normal mood and affect. Normal behavior. Normal judgment and thought content. ?CARDIOVASCULAR: Normal heart rate noted, regular rhythm ?RESPIRATORY: Effort and breath sounds normal, no problems with respiration noted ?MUSCULOSKELETAL: Normal range of motion. No edema and no tenderness. 2+ distal pulses. ?ABDOMEN: Soft, nontender, nondistended, gravid. ?CERVIX:  Not examined ? ? ? ?Results for orders placed or performed during the hospital encounter of 07/02/21 (from the past 48 hour(s))  ?Basic metabolic panel     Status: Abnormal  ? Collection Time: 07/05/21 10:43 AM  ?Result Value Ref Range  ? Sodium 138 135 - 145 mmol/L  ? Potassium 3.5 3.5 - 5.1 mmol/L  ? Chloride 108 98 - 111 mmol/L  ? CO2 21 (L) 22 - 32 mmol/L  ? Glucose, Bld 158 (H) 70 - 99 mg/dL  ?  Comment: Glucose reference range applies only to samples taken after fasting for at least 8 hours.  ? BUN 6 6 - 20 mg/dL  ? Creatinine, Ser 0.75 0.44 - 1.00 mg/dL  ? Calcium 8.8 (L) 8.9 - 10.3 mg/dL  ? GFR,  Estimated >60 >60 mL/min  ?  Comment: (NOTE) ?Calculated using the CKD-EPI Creatinine Equation (2021) ?  ? Anion gap 9 5 - 15  ?  Comment: Performed at [facility] [address], [place] [address] ? ? ?ECHO FETAL ? ?Result Date: 07/04/2021 ?--------------------------------------------------------------------------------   FETAL ECHOCARDIOGRAM REPORT   Patient Name:   Krystal LUCKEYCHRISCOE Date of Exam: 07/04/2021 Medical Rec #:  [id number]          Time of Exam: 127:25:36PM Accession #:    [id number]         Height:       71.0 in Date of Birth:  725-Aug-1992          Weight:       227.6 lb Patient Age:    89years            BSA:          2.23 m? Patient Gender: F                   BP:           **/** mmHg Exam Location:  Pediatrics          HR:           ** bpm. Procedure: Fetal Echo Indications:    Fetal abn, concern for TOF Study Location: Inpatient  Sonographer:    MLemay FE, PE Referring Phys: [id number] HillHistory: Patient has no  prior history of Echocardiogram examinations. IMPRESSIONS  1. Fetal echocardiogram performed at 23 weeks and 5 days because of concern for fetal congenital heart disease.  2. Tetralogy of Fallot.  3. 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 The annulus measures 3.4 mm, Boston z-score -2.7.  4. Moderate anterior malalignment conoventricular septal defect.  5. Overriding aorta 4.4 mm z-score 0.9  6. 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 Normal systemic venous return  8. One left sided pulmonary vein returns normally to the left atrium  9. Normal biventricular size and systolic function 10. Normal ductus arteriosus with right to left shunt 11. PR interval 104, heart rate 155 bpm FINDINGS  Segmental Anatomy, Cardiac Position and Situs: Solitus D Ventricular Looping S. The heart position is within the left hemithorax (levocardia). Normal visceral situs. Systemic Veins: A superior vena cava is right-sided and drains normally to the right atrium. The  inferior vena cava is right-sided and inserts into the right atrium normally. Pulmonary Veins: One left sided pulmonary vein returns normally to the left atrium.  Atria: Foramen ovale with right to left shunt. Normal foraminal flap in the left atrium. The right atrium is normal in size. The left atrium is normal in size. Tricuspid Valve: Tricuspid inflow is laminar, with normal Doppler velocity pattern. There is no tricuspid valve regurgitation. There is no evidence of tricuspid valve stenosis. Tricuspid valve measures 6.9 mm z-score 0.6. Right Ventricle: There is normal right ventricular size and qualitatively normal systolic shortening. Right ventricular systolic function is qualitatively normal. Mitral Valve: Mitral inflow is laminar, with normal Doppler velocity pattern. There is no evidence of mitral valve stenosis. There is no mitral valve regurgitation. The mitral valve measures 6.9 mm z-score 1.1. Left Ventricle: There is normal left ventricular size and qualitatively normal systolic shortening. Left ventricular systolic shortening is qualitatively normal. VSD: There is a moderate anterior malalignment ventricular septal defect. There is bidirectional shunting noted. Conotruncal Anatomy: There is Tetralogy of Fallot. The pulmonary arteries appear to be continuous. 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 The annulus measures 3.4 mm, [place] z-score -2.7. RVOT: There is no right ventricular outflow tract obstruction. Pulmonary Valve: There is no pulmonary valve regurgitation. Pulmonary Arteries: The branch pulmonary arteries appear normal. LVOT: There is no left ventricular outflow tract obstruction. Aortic Valve: There is no aortic valve stenosis. There is no aortic valve regurgitation. Aorta: The ascending aorta, transverse arch and descending aorta appear unobstructed. There is a left aortic arch, branch pattern not well delineated. Ductus Arteriosus: A patent ductus  arteriosus is seen. Normal right to left shunt. Pericardium: There is no pericardial effusion. Spectral Doppler and color Doppler were used to assess shunts and outflow tracts. _____________________________ Electronically signed by: Sandria Manly MD at 5:41:35 PM on 07/04/2021  cc:     Final    ? ?Current scheduled medications ? amLODipine  10 mg Oral Daily  ? aspirin EC  162 mg Oral Daily  ? feeding supplement  1 Container Oral TID BM  ? hydrALAZINE  100 mg Oral TID  ? labetalol  200 mg Oral TID  ? levothyroxine  125 mcg Oral QAC breakfast  ? loratadine  10 mg Oral Daily  ? LORazepam  1 mg Intravenous Once  ? magnesium oxide  400 mg Oral BID  ? methylPREDNISolone (SOLU-MEDROL) injection  16 mg Intravenous Q8H  ? ondansetron  8 mg Oral Q8H  ? pantoprazole (PROTONIX) IV  40 mg Intravenous Daily  ? prenatal  multivitamin  1 tablet Oral Q1200  ? scopolamine  1 patch Transdermal Q72H  ? venlafaxine XR  150 mg Oral Daily  ? ? ?I have reviewed the patient's current medications. ? ?ASSESSMENT: ?Principal Problem: ?  IUGR, antenatal ?Active Problems: ?  History of HELLP syndrome, currently pregnant ?  Essential hypertension ?  History of preterm delivery, currently pregnant ?  Anisocoria ?  Chronic hypertension with exacerbation during pregnancy in second trimester ?  Poor fetal growth affecting management of mother in second trimester ?  Headache in pregnancy, antepartum, second trimester ?  [redacted] weeks gestation of pregnancy ? ? ?PLAN: ?Refractory/Labile CHTN ?- Current medications: Amlodipine 10, Hydral 100 Q8 and Labetalol 300Q8.  ?- At this time, does not appear c/w PreE - P/C ratio 0.11, and other PreE labs wnl. 24 hr urine was 170 (normal) ?- Appreciate cardiology input and agree that when pt is able to tolerate PO and keep medications down her blood pressure is well controlled.  ? ?Aniscoria ?- Normal head CT and normal MRI/V/A ?- Appreciated neurology input ? ?Atypical Migraine ?- Will try Magnesium. Cannot do B12  due to red dye allergy.  ?- She is on anti-emetics. Generally will avoid fiorcet due to rebound HA. Continue Flexeril and Tylenol at this time when tolerating PO. We are also attempting IV steroids to see if

## 2021-07-06 ENCOUNTER — Encounter (HOSPITAL_COMMUNITY): Payer: Self-pay | Admitting: Obstetrics and Gynecology

## 2021-07-06 ENCOUNTER — Inpatient Hospital Stay (HOSPITAL_COMMUNITY): Payer: Commercial Managed Care - HMO

## 2021-07-06 DIAGNOSIS — G43901 Migraine, unspecified, not intractable, with status migrainosus: Secondary | ICD-10-CM

## 2021-07-06 DIAGNOSIS — F332 Major depressive disorder, recurrent severe without psychotic features: Secondary | ICD-10-CM | POA: Diagnosis not present

## 2021-07-06 DIAGNOSIS — R112 Nausea with vomiting, unspecified: Secondary | ICD-10-CM | POA: Diagnosis not present

## 2021-07-06 DIAGNOSIS — F419 Anxiety disorder, unspecified: Secondary | ICD-10-CM | POA: Diagnosis not present

## 2021-07-06 DIAGNOSIS — F988 Other specified behavioral and emotional disorders with onset usually occurring in childhood and adolescence: Secondary | ICD-10-CM | POA: Diagnosis not present

## 2021-07-06 DIAGNOSIS — G43511 Persistent migraine aura without cerebral infarction, intractable, with status migrainosus: Secondary | ICD-10-CM | POA: Diagnosis not present

## 2021-07-06 DIAGNOSIS — F4310 Post-traumatic stress disorder, unspecified: Secondary | ICD-10-CM | POA: Diagnosis not present

## 2021-07-06 LAB — CBC WITH DIFFERENTIAL/PLATELET
Abs Immature Granulocytes: 0.13 10*3/uL — ABNORMAL HIGH (ref 0.00–0.07)
Basophils Absolute: 0 10*3/uL (ref 0.0–0.1)
Basophils Relative: 0 %
Eosinophils Absolute: 0 10*3/uL (ref 0.0–0.5)
Eosinophils Relative: 0 %
HCT: 30.5 % — ABNORMAL LOW (ref 36.0–46.0)
Hemoglobin: 10.7 g/dL — ABNORMAL LOW (ref 12.0–15.0)
Immature Granulocytes: 1 %
Lymphocytes Relative: 11 %
Lymphs Abs: 1 10*3/uL (ref 0.7–4.0)
MCH: 33.2 pg (ref 26.0–34.0)
MCHC: 35.1 g/dL (ref 30.0–36.0)
MCV: 94.7 fL (ref 80.0–100.0)
Monocytes Absolute: 0.1 10*3/uL (ref 0.1–1.0)
Monocytes Relative: 1 %
Neutro Abs: 8.2 10*3/uL — ABNORMAL HIGH (ref 1.7–7.7)
Neutrophils Relative %: 87 %
Platelets: 224 10*3/uL (ref 150–400)
RBC: 3.22 MIL/uL — ABNORMAL LOW (ref 3.87–5.11)
RDW: 12.2 % (ref 11.5–15.5)
WBC: 9.4 10*3/uL (ref 4.0–10.5)
nRBC: 0 % (ref 0.0–0.2)

## 2021-07-06 LAB — COMPREHENSIVE METABOLIC PANEL
ALT: 16 U/L (ref 0–44)
AST: 22 U/L (ref 15–41)
Albumin: 3 g/dL — ABNORMAL LOW (ref 3.5–5.0)
Alkaline Phosphatase: 43 U/L (ref 38–126)
Anion gap: 6 (ref 5–15)
BUN: 8 mg/dL (ref 6–20)
CO2: 21 mmol/L — ABNORMAL LOW (ref 22–32)
Calcium: 8.8 mg/dL — ABNORMAL LOW (ref 8.9–10.3)
Chloride: 107 mmol/L (ref 98–111)
Creatinine, Ser: 0.74 mg/dL (ref 0.44–1.00)
GFR, Estimated: >60 mL/min (ref >60)
Glucose, Bld: 144 mg/dL — ABNORMAL HIGH (ref 70–99)
Potassium: 4 mmol/L (ref 3.5–5.1)
Sodium: 134 mmol/L — ABNORMAL LOW (ref 135–145)
Total Bilirubin: 0.6 mg/dL (ref 0.3–1.2)
Total Protein: 5.5 g/dL — ABNORMAL LOW (ref 6.5–8.1)

## 2021-07-06 LAB — PROTEIN / CREATININE RATIO, URINE
Creatinine, Urine: 155.5 mg/dL
Protein Creatinine Ratio: 0.17 mg/mg{Cre} — ABNORMAL HIGH (ref 0.00–0.15)
Total Protein, Urine: 26 mg/dL

## 2021-07-06 LAB — LIPASE, BLOOD: Lipase: 27 U/L (ref 11–51)

## 2021-07-06 LAB — AMYLASE: Amylase: 35 U/L (ref 28–100)

## 2021-07-06 MED ORDER — HYDROMORPHONE HCL 1 MG/ML IJ SOLN
1.0000 mg | Freq: Once | INTRAMUSCULAR | Status: AC
  Administered 2021-07-06: 1 mg via INTRAVENOUS
  Filled 2021-07-06: qty 1

## 2021-07-06 MED ORDER — LABETALOL HCL 100 MG PO TABS
300.0000 mg | ORAL_TABLET | Freq: Three times a day (TID) | ORAL | Status: DC
Start: 2021-07-06 — End: 2021-07-09
  Administered 2021-07-06 – 2021-07-09 (×8): 300 mg via ORAL
  Filled 2021-07-06 (×9): qty 1

## 2021-07-06 MED ORDER — SUMATRIPTAN SUCCINATE 6 MG/0.5ML ~~LOC~~ SOLN
6.0000 mg | Freq: Once | SUBCUTANEOUS | Status: AC
  Administered 2021-07-06: 6 mg via SUBCUTANEOUS
  Filled 2021-07-06: qty 0.5

## 2021-07-06 NOTE — Progress Notes (Signed)
?      ?                 PROGRESS NOTE ? ?      ?PATIENT DETAILS ?Name: Krystal [name] ?Age: 31 y.o. ?Sex: female ?Date of Birth: 1990-05-27 ?Admit Date: 07/02/2021 ?Admitting Physician Radene Gunning, MD ?ACZ:YSAYTK, Larey Dresser, MD ? ?Brief Summary: ?31 year old-[redacted] weeks pregnant-presented with headache/anxiety/intractable nausea/vomiting-TRH consulted on 5/12 for further assistance. ? ?5/9>> MRI brain: No acute abnormality ?5/9>> MRA brain: No significant stenosis ?5/9>> MRV brain: No evidence of dural venous sinus thrombosis. ?5/13>> RUQ ultrasound: S/p cholecystectomy-unremarkable ? ?Subjective: ?Continues to complain of headaches.  Vomiting is better.  Very anxious with the headaches. ? ?Objective: ?Vitals: ?Blood pressure 140/83, pulse 77, temperature 97.8 ?F (36.6 ?C), temperature source Oral, resp. rate 18, height '5\' 11"'$  (1.803 m), weight 103.7 kg, last menstrual period 01/19/2021, SpO2 99 %, not currently breastfeeding.  ? ?Exam: ?Gen Exam: Anxious but not in any distress. ?HEENT:atraumatic, normocephalic ?Chest: B/L clear to auscultation anteriorly ?CVS:S1S2 regular ?Abdomen:soft non tender, non distended ?Extremities:no edema ?Neurology: Non focal ?Skin: no rash ? ?Pertinent Labs/Radiology: ? ?  Latest Ref Rng & Units 07/06/2021  ?  5:11 AM 07/02/2021  ?  3:07 PM 06/27/2021  ?  4:37 PM  ?CBC  ?WBC 4.0 - 10.5 K/uL 9.4   8.7   11.1    ?Hemoglobin 12.0 - 15.0 g/dL 10.7   12.0   13.1    ?Hematocrit 36.0 - 46.0 % 30.5   33.6   36.0    ?Platelets 150 - 400 K/uL 224   202   326    ?  ?Lab Results  ?Component Value Date  ? NA 134 (L) 07/06/2021  ? K 4.0 07/06/2021  ? CL 107 07/06/2021  ? CO2 21 (L) 07/06/2021  ?  ? ? ?Assessment/Plan: ?Headaches with intractable nausea/vomiting: Neuroimaging negative-Per neurology-likely migraine variant.  Continue supportive care-on steroids-headaches continue-on antiemetics as well.  Very difficult situation-discussed with neurology-Dr. Quinn Axe over the phone-they will make further  recommendations. ? ?Anxiety: Playing a significant role in the symptoms-on Klonopin-psychiatry following. ? ?Chronic hypertension: BP elevated due to anxiety/nausea/vomiting.  Reasonably well controlled-cardiology following.  On numerous antihypertensives. ? ?No specific recommendations-since numerous other services are following and managing similar issues-TR[facility] will sign off. ? ?Diet: ?Diet Order   ? ?       ?  Diet regular Room service appropriate? Yes; Fluid consistency: Thin  Diet effective now       ?  ? ?  ?  ? ?  ?  ? ? ?Antimicrobial agents: ?Anti-infectives (From admission, onward)  ? ? None  ? ?  ? ? ? ?MEDICATIONS: ?Scheduled Meds: ? amLODipine  10 mg Oral Daily  ? aspirin EC  162 mg Oral Daily  ? betamethasone acetate-betamethasone sodium phosphate  12 mg Intramuscular Q24 Hr x 2  ? clonazePAM  0.5 mg Oral BID  ? feeding supplement  1 Container Oral TID BM  ? hydrALAZINE  100 mg Oral TID  ? labetalol  300 mg Oral TID  ? levothyroxine  125 mcg Oral QAC breakfast  ? loratadine  10 mg Oral Daily  ? LORazepam  1 mg Intravenous Once  ? magnesium oxide  400 mg Oral BID  ? methylPREDNISolone (SOLU-MEDROL) injection  125 mg Intravenous Q12H  ? ondansetron  8 mg Oral Q8H  ? pantoprazole (PROTONIX) IV  40 mg Intravenous Daily  ? prenatal multivitamin  1 tablet Oral Q1200  ?  venlafaxine XR  225 mg Oral Daily  ? ?Continuous Infusions: ? famotidine (PEPCID) IV 20 mg (07/06/21 0954)  ? lactated ringers 150 mL/hr at 07/06/21 0054  ? ondansetron [facility]) IV 8 mg (07/06/21 0441)  ? promethazine (PHENERGAN) injection (IM or IVPB) 25 mg (07/06/21 2778)  ? ?PRN Meds:.acetaminophen, albuterol, calcium carbonate, cyclobenzaprine, diphenhydrAMINE, labetalol **AND** labetalol **AND** labetalol **AND** hydrALAZINE **AND** Measure blood pressure, hydrOXYzine, promethazine (PHENERGAN) injection (IM or IVPB), trimethobenzamide, zolpidem ? ? ?I have personally reviewed following labs and imaging studies ? ?LABORATORY  DATA: ?CBC: ?Recent Labs  ?Lab 07/02/21 ?1507 07/06/21 ?2423  ?WBC 8.7 9.4  ?NEUTROABS  --  8.2*  ?HGB 12.0 10.7*  ?HCT 33.6* 30.5*  ?MCV 92.3 94.7  ?PLT 202 224  ? ? ?Basic Metabolic Panel: ?Recent Labs  ?Lab 07/02/21 ?1507 07/03/21 ?1228 07/05/21 ?1043 07/06/21 ?5361  ?NA 137 134* 138 134*  ?K 3.3* 3.4* 3.5 4.0  ?CL 106 105 108 107  ?CO2 24 23 21* 21*  ?GLUCOSE 79 108* 158* 144*  ?BUN '13 8 6 8  '$ ?CREATININE 0.68 0.63 0.75 0.74  ?CALCIUM 8.9 8.8* 8.8* 8.8*  ? ? ?GFR: ?Estimated Creatinine Clearance: 133.9 mL/min (by C-G formula based on SCr of 0.74 mg/dL). ? ?Liver Function Tests: ?Recent Labs  ?Lab 07/02/21 ?1507 07/06/21 ?4431  ?AST 20 22  ?ALT 18 16  ?ALKPHOS 51 43  ?BILITOT 0.8 0.6  ?PROT 6.0* 5.5*  ?ALBUMIN 3.3* 3.0*  ? ?Recent Labs  ?Lab 07/06/21 ?5400  ?LIPASE 27  ?AMYLASE 35  ? ?No results for input(s): AMMONIA in the last 168 hours. ? ?Coagulation Profile: ?No results for input(s): INR, PROTIME in the last 168 hours. ? ?Cardiac Enzymes: ?No results for input(s): CKTOTAL, CKMB, CKMBINDEX, TROPONINI in the last 168 hours. ? ?BNP (last 3 results) ?Recent Labs  ?  06/10/21 ?1220  ?PROBNP 165*  ? ? ?Lipid Profile: ?No results for input(s): CHOL, HDL, LDLCALC, TRIG, CHOLHDL, LDLDIRECT in the last 72 hours. ? ?Thyroid Function Tests: ?Recent Labs  ?  07/03/21 ?1228  ?TSH 2.154  ?FREET4 0.89  ? ? ?Anemia Panel: ?No results for input(s): VITAMINB12, FOLATE, FERRITIN, TIBC, IRON, RETICCTPCT in the last 72 hours. ? ?Urine analysis: ?   ?Component Value Date/Time  ? COLORURINE BROWN (A) 06/27/2021 1416  ? APPEARANCEUR CLOUDY (A) 06/27/2021 1416  ? LABSPEC >1.030 (H) 06/27/2021 1416  ? PHURINE 5.5 06/27/2021 1416  ? GLUCOSEU NEGATIVE 06/27/2021 1416  ? HGBUR TRACE (A) 06/27/2021 1416  ? BILIRUBINUR SMALL (A) 06/27/2021 1416  ? [place] NEGATIVE 06/27/2021 1416  ? PROTEINUR 100 (A) 06/27/2021 1416  ? NITRITE NEGATIVE 06/27/2021 1416  ? LEUKOCYTESUR NEGATIVE 06/27/2021 1416  ? ? ?Sepsis Labs: ?Lactic Acid, Venous ?    ?Component Value Date/Time  ? LATICACIDVEN 1.35 03/20/2016 1649  ? ? ?MICROBIOLOGY: ?No results found for this or any previous visit (from the past 240 hour(s)). ? ?RADIOLOGY STUDIES/RESULTS: ? ? LOS: 4 days  ? ?Oren Binet, MD  ?[facility] ? ? ? ?To contact the attending provider between 7A-7P or the covering provider during after hours 7P-7A, please log into the web site [email or web address] and access using universal [place] password for that web site. If you do not have the password, please call the hospital operator. ? ?07/06/2021, 10:48 AM ? ? ? ?
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
## 2021-07-06 NOTE — Progress Notes (Addendum)
NEUROLOGY CONSULTATION PROGRESS NOTE  ? ?Date of service: Jul 06, 2021 ?Patient Name: Krystal [name] ?MRN:  [id number] ?DOB:  Feb 24, 1991 ? ?Brief HPI  ? ?Krystal [name] is a 31 y.o. female [phone] with IUP at  24 weeks with a PMH significant for HELLP (12/08/2017), Migraines, Severe uncontrolled hypertension (10/08/2017), who presents with persistent headache, unchanged x4 weeks. We consulted on her on previous admission one month ago as well. MRI brain / MRA head / MRV head all negative. Neurology asked to reconsult for persistent headache. ?  ?Interval Hx  ? ?Headache persists, unchanged. Reports severe nausea with multiple bouts of emesis daily. No response to IV solumedrol. Reports scintillating scotomas, blurry vision. ? ?Vitals  ? ?Vitals:  ? 07/05/21 1937 07/05/21 2337 07/06/21 0440 07/06/21 0738  ?BP:   125/68 140/83  ?Pulse: 77 71 73 77  ?Resp: '18 18 18 18  '$ ?Temp: 97.8 ?F (36.6 ?C) 97.8 ?F (36.6 ?C) 98 ?F (36.7 ?C) 97.8 ?F (36.6 ?C)  ?TempSrc: Oral Oral Oral Oral  ?SpO2: 100% 100%  99%  ?Weight:   103.7 kg   ?Height:      ?  ? ?Body mass index is 31.88 kg/m?. ? ?Physical Exam  ? ?General: lying in bed, reports discomfort ?HENT: Normal oropharynx and mucosa. Normal external appearance of ears and nose.  ?Neck: Supple, no pain or tenderness  ?CV: No JVD. No peripheral edema.  ?Pulmonary: Symmetric Chest rise. Normal respiratory effort.  ?Ext: No cyanosis, edema, or deformity, left arm amputation  ?Skin: No rash. Normal palpation of skin.   ?Musculoskeletal: Normal digits and nails by inspection. No clubbing.  ? ?Neurologic Examination  ?Mental status/Cognition: Alert, oriented to self, place, month and year, good attention.  ?Speech/language: Fluent, comprehension intact, object naming intact, repetition intact.  ?Cranial nerves:  ? CN II Right pupil minimally more dilated than left with postive light reflex and accomodation, minimal right temporal peripheral vision deficits   ? CN III,IV,VI EOM  intact, no gaze preference or deviation, no nystagmus   ? CN V normal sensation in V1, V2, and V3 segments bilaterally   ? CN VII no asymmetry, no nasolabial fold flattening   ? CN VIII normal hearing to speech   ? CN IX & X normal palatal elevation, no uvular deviation   ? CN XI 5/5 head turn and 5/5 shoulder shrug bilaterally   ? CN XII midline tongue protrusion   ? ?Motor:  ?Muscle bulk: normal, tone normal, pronator drift negative, tremor absent ?5/5 Strength in bilateral upper and lower extremities, LUE below elbow amputation ? ?Sensation: ?Grossly normal sensation in bilateral upper and lower extremities.  ? ?Coordination/Complex Motor:  ?- Finger to Nose intact ? ? ?Labs  ? ?Basic Metabolic Panel:  ?Lab Results  ?Component Value Date  ? NA 134 (L) 07/06/2021  ? K 4.0 07/06/2021  ? CO2 21 (L) 07/06/2021  ? GLUCOSE 144 (H) 07/06/2021  ? BUN 8 07/06/2021  ? CREATININE 0.74 07/06/2021  ? CALCIUM 8.8 (L) 07/06/2021  ? GFRNONAA >60 07/06/2021  ? GFRAA >60 12/18/2017  ? ?HbA1c:  ?Lab Results  ?Component Value Date  ? HGBA1C 5.1 04/01/2021  ? ?LDL: No results found for: LDLCALC ?Urine Drug Screen:  ?   ?Component Value Date/Time  ? [place] DETECTED 11/18/2020 2140  ? [place] DETECTED 11/18/2020 2140  ? LABBENZ POSITIVE (A) 11/18/2020 2140  ? [place] DETECTED 11/18/2020 2140  ? THCU POSITIVE (A) 11/18/2020 2140  ? LABBARB NONE  DETECTED 11/18/2020 2140  ?  ?Alcohol Level  ?   ?Component Value Date/Time  ? ETH <10 11/18/2020 1745  ? ?No results found for: PHENYTOIN, ZONISAMIDE, LAMOTRIGINE, LEVETIRACETA ?No results found for: PHENYTOIN, PHENOBARB, VALPROATE, CBMZ ? ?Imaging and Diagnostic studies  ?Results for orders placed during the hospital encounter of 07/02/21 ? ?MR ANGIO HEAD WO CONTRAST ? ?Narrative ?CLINICAL DATA:  Initial evaluation for acute headache, currently ?pregnant. ? ?EXAM: ?MRI HEAD WITHOUT CONTRAST ? ?MRA HEAD WITHOUT CONTRAST ? ?MRV HEAD WITHOUT  CONTRAST ? ?TECHNIQUE: ?Multiplanar, multi-echo pulse sequences of the brain and surrounding ?structures were acquired without intravenous contrast. Angiographic ?images of the Circle of Willis were acquired using MRA technique ?without intravenous contrast. Additional dedicated MRV images ?without contrast were also obtained. ? ?COMPARISON:  Prior CT from earlier the same day. ? ?FINDINGS: ?MRI HEAD FINDINGS ? ?Brain: Examination mildly degraded by motion. ? ?Cerebral volume within normal limits. No focal parenchymal signal ?abnormality. No evidence for acute or subacute infarct. Gray-white ?matter differentiation maintained. No areas of chronic cortical ?infarction. No acute or chronic intracranial blood products. ? ?No mass lesion, midline shift or mass effect. No hydrocephalus or ?extra-axial fluid collection. Pituitary gland suprasellar region ?normal. Midline structures intact and normally formed. ? ?Vascular: Major intracranial vascular flow voids are well ?maintained. ? ?Skull and upper cervical spine: Craniocervical junction within ?normal limits. Bone marrow signal intensity normal. No scalp soft ?tissue abnormality. ? ?Sinuses/Orbits: Globes and orbital soft tissues within normal ?limits. Mild scattered mucosal thickening noted about the ethmoidal ?air cells and maxillary sinuses. Paranasal sinuses are otherwise ?clear. No significant mastoid effusion. ? ?Other: None. ? ?MRA HEAD FINDINGS ? ?Anterior circulation: Both internal carotid arteries widely patent ?to the termini without stenosis. A1 segments widely patent. Normal ?anterior communicating artery complex. Both anterior cerebral ?arteries widely patent to their distal aspects without stenosis. No ?M1 stenosis or occlusion. Normal MCA bifurcations. Distal MCA ?branches well perfused and symmetric. ? ?Posterior circulation: Both V4 segments widely patent without ?stenosis. Right PICA patent. Left PICA origin not visualized. ?Basilar patent without  stenosis. Superior cerebral arteries patent ?bilaterally. Right PCA supplied via the basilar. Left PCA supplied ?via a hypoplastic left P1 segment and robust left posterior ?communicating artery. Both PCAs well perfused or distal aspects ?without stenosis. ? ?Anatomic variants: As above. No intracranial aneurysm or other ?vascular malformation. ? ?MRV HEAD FINDINGS ? ?Normal flow related signal seen throughout the superior sagittal ?sinus to the torcula. Transverse and sigmoid sinuses are patent as ?are the jugular bulbs and visualized proximal internal jugular ?veins. Straight sinus, vein of Galen, internal cerebral veins, basal ?veins of Rosenthal appear patent. No appreciable abnormality about ?the cavernous sinus. No visible cortical vein thrombosis. No dural ?venous sinus thrombosis. ? ?IMPRESSION: ?1. Normal MRI of the brain. No acute intracranial abnormality ?identified. ?2. Normal intracranial MRA. ?3. Normal intracranial MRV. No evidence for dural venous sinus ?thrombosis. ? ? ?Electronically Signed ?By: Jeannine Boga M.D. ?On: 07/02/2021 19:32 ? ? ?Impression  ? ?Krystal [name] is a 31 y.o. female [phone] with IUP at  23 weeks with a PMH significant for HELLP (12/08/2017), Migraines, Severe uncontrolled hypertension (10/08/2017), who presents with persistent headache, unchanged x4 weeks. We consulted on her on previous admission one month ago as well. Neurology asked to reconsult for persistent headache.  ? ?Patient has intractable status migrainosus with aura. No red flag sx. Headache has not worsened or changed in character, but is refractory. She has tried and failed numerous therapies  including promethazine, zofran, benadryl, IV solumedrol, labetalol, amlodipine, venlafaxine, opiates. ? ?Allergic to compazine, reglan, duloxetine, amitriptyline.  ? ?She has not been tried on a triptan and will try sumatriptan '6mg'$  subq x1 today. If she does not respond to this, Dr. Erlinda Hong has kindly agreed to  come over and do bilateral occipital nerve blocks for her tomorrow. I think this is certainly the best remaining option we have to offer her to break this. I would also highly recommend cognitive behavioral therapy /stress mgmt/relaxation train

## 2021-07-06 NOTE — Progress Notes (Signed)
Patient ID: Krystal [name], female   DOB: [birth year], 31 y.o.   MRN: [id number] ?FACULTY PRACTICE ANTEPARTUM(COMPREHENSIVE) NOTE ? ?Krystal [name] is a 31 y.o. [id number] with Estimated Date of Delivery: 10/26/21   By  early ultrasound 101w0d who is admitted for BP management.   ? ?Fetal presentation is cephalic. ?Length of Stay:  4  Days  Date of admission:07/02/2021 ? ?Subjective: ?Feels better kept orals down ?HA down to 4 ?No emesis ?Patient reports the fetal movement as active. ?Patient reports uterine contraction  activity as none. ?Patient reports  vaginal bleeding as none. ?Patient describes fluid per vagina as None. ? ?Vitals:  Blood pressure 140/83, pulse 77, temperature 97.8 ?F (36.6 ?C), temperature source Oral, resp. rate 18, height '5\' 11"'$  (1.803 m), weight 103.7 kg, last menstrual period 01/19/2021, SpO2 99 %, not currently breastfeeding. ?Vitals:  ? 07/05/21 1937 07/05/21 2337 07/06/21 0440 07/06/21 0738  ?BP: (!) 153/88 137/72 125/68 140/83  ?Pulse: 77 71 73 77  ?Resp: '18 18 18 18  '$ ?Temp: 97.8 ?F (36.6 ?C) 97.8 ?F (36.6 ?C) 98 ?F (36.7 ?C) 97.8 ?F (36.6 ?C)  ?TempSrc: Oral Oral Oral Oral  ?SpO2: 100% 100%  99%  ?Weight:   103.7 kg   ?Height:      ? ?Physical Examination: ? General appearance - alert, well appearing, and in no distress ?Abdomen - soft, nontender, nondistended, no masses or organomegaly ?Fundal Height:  size less than dates ?Pelvic Exam:  examination not indicated ?Cervical Exam: Not evaluated.  ?Extremities: extremities normal, atraumatic, no cyanosis or edema with DTRs 2+ bilaterally ?Membranes:intact ? ?Fetal Monitoring:  140s appropriate for GA    ? ?Labs:  ?Results for orders placed or performed during the hospital encounter of 07/02/21 (from the past 24 hour(s))  ?Basic metabolic panel  ? Collection Time: 07/05/21 10:43 AM  ?Result Value Ref Range  ? Sodium 138 135 - 145 mmol/L  ? Potassium 3.5 3.5 - 5.1 mmol/L  ? Chloride 108 98 - 111 mmol/L  ? CO2 21 (L) 22 - 32 mmol/L  ?  Glucose, Bld 158 (H) 70 - 99 mg/dL  ? BUN 6 6 - 20 mg/dL  ? Creatinine, Ser 0.75 0.44 - 1.00 mg/dL  ? Calcium 8.8 (L) 8.9 - 10.3 mg/dL  ? GFR, Estimated >60 >60 mL/min  ? Anion gap 9 5 - 15  ?Comprehensive metabolic panel  ? Collection Time: 07/06/21  5:11 AM  ?Result Value Ref Range  ? Sodium 134 (L) 135 - 145 mmol/L  ? Potassium 4.0 3.5 - 5.1 mmol/L  ? Chloride 107 98 - 111 mmol/L  ? CO2 21 (L) 22 - 32 mmol/L  ? Glucose, Bld 144 (H) 70 - 99 mg/dL  ? BUN 8 6 - 20 mg/dL  ? Creatinine, Ser 0.74 0.44 - 1.00 mg/dL  ? Calcium 8.8 (L) 8.9 - 10.3 mg/dL  ? Total Protein 5.5 (L) 6.5 - 8.1 g/dL  ? Albumin 3.0 (L) 3.5 - 5.0 g/dL  ? AST 22 15 - 41 U/L  ? ALT 16 0 - 44 U/L  ? Alkaline Phosphatase 43 38 - 126 U/L  ? Total Bilirubin 0.6 0.3 - 1.2 mg/dL  ? GFR, Estimated >60 >60 mL/min  ? Anion gap 6 5 - 15  ?CBC with Differential/Platelet  ? Collection Time: 07/06/21  5:11 AM  ?Result Value Ref Range  ? WBC 9.4 4.0 - 10.5 K/uL  ? RBC 3.22 (L) 3.87 - 5.11 MIL/uL  ? Hemoglobin 10.7 (L)  12.0 - 15.0 g/dL  ? HCT 30.5 (L) 36.0 - 46.0 %  ? MCV 94.7 80.0 - 100.0 fL  ? MCH 33.2 26.0 - 34.0 pg  ? MCHC 35.1 30.0 - 36.0 g/dL  ? RDW 12.2 11.5 - 15.5 %  ? Platelets 224 150 - 400 K/uL  ? nRBC 0.0 0.0 - 0.2 %  ? Neutrophils Relative % 87 %  ? Neutro Abs 8.2 (H) 1.7 - 7.7 K/uL  ? Lymphocytes Relative 11 %  ? Lymphs Abs 1.0 0.7 - 4.0 K/uL  ? Monocytes Relative 1 %  ? Monocytes Absolute 0.1 0.1 - 1.0 K/uL  ? Eosinophils Relative 0 %  ? Eosinophils Absolute 0.0 0.0 - 0.5 K/uL  ? Basophils Relative 0 %  ? Basophils Absolute 0.0 0.0 - 0.1 K/uL  ? Immature Granulocytes 1 %  ? Abs Immature Granulocytes 0.13 (H) 0.00 - 0.07 K/uL  ?Amylase  ? Collection Time: 07/06/21  5:11 AM  ?Result Value Ref Range  ? Amylase 35 28 - 100 U/L  ?Lipase, blood  ? Collection Time: 07/06/21  5:11 AM  ?Result Value Ref Range  ? Lipase 27 11 - 51 U/L  ? ? ?Imaging Studies:    ?  ? ?Medications:  Scheduled ? amLODipine  10 mg Oral Daily  ? aspirin EC  162 mg Oral Daily  ?  betamethasone acetate-betamethasone sodium phosphate  12 mg Intramuscular Q24 Hr x 2  ? clonazePAM  0.5 mg Oral BID  ? feeding supplement  1 Container Oral TID BM  ? hydrALAZINE  100 mg Oral TID  ? labetalol  200 mg Oral TID  ? levothyroxine  125 mcg Oral QAC breakfast  ? loratadine  10 mg Oral Daily  ? LORazepam  1 mg Intravenous Once  ? magnesium oxide  400 mg Oral BID  ? methylPREDNISolone (SOLU-MEDROL) injection  125 mg Intravenous Q12H  ? ondansetron  8 mg Oral Q8H  ? pantoprazole (PROTONIX) IV  40 mg Intravenous Daily  ? prenatal multivitamin  1 tablet Oral Q1200  ? venlafaxine XR  225 mg Oral Daily  ? ?I have reviewed the patient's current medications. ? ?ASSESSMENT: ?[id number] 56w0dEstimated Date of Delivery: 10/26/21  ?Principal Problem: ?  IUGR, antenatal ?Active Problems: ?  History of HELLP syndrome, currently pregnant ?  Essential hypertension ?  History of preterm delivery, currently pregnant ?  Anisocoria ?  Chronic hypertension with exacerbation during pregnancy in second trimester ?  Poor fetal growth affecting management of mother in second trimester ?  Headache in pregnancy, antepartum, second trimester ?  [redacted] weeks gestation of pregnancy ? ?PLAN: ?Refractory/Labile CHTN ?- Current medications: Amlodipine 10, Hydral 100 Q8 and Labetalol 300Q8.  ?- At this time, does not appear c/w PreE - P/C ratio 0.11, and other PreE labs wnl. 24 hr urine was 170 (normal) ?- Appreciate cardiology input and agree that when pt is able to tolerate PO and keep medications down her blood pressure is well controlled.  ? ? ?2.  N?V refractory high dose methylprednisolone x 48 hours then titrate with oral prednisone ? ?3.  FWB ?- FHT daily ?- Confirmed ToF by fetal echo - Reviewed with NICU. They will provide their input on location for delivery given patient's high risk for preterm delivery (prior deliveries at 26 and 32 weeks).  ?- FGR with elevated dopplers also noted. 5/31 is next planned imaging for growth and dopplers.   ?  ? ? ?[place]?07/06/2021,8:17 AM ? ? ? ? ? ?

## 2021-07-06 NOTE — Progress Notes (Signed)
?Cardiology Progress Note  ?Patient ID: Krystal [name] ?MRN: [id number] ?DOB: [birth year] ?Date of Encounter: 07/06/2021 ? ?Primary Cardiologist: Freada Bergeron, MD ? ?Subjective  ? ?Chief Complaint: Nausea and vomiting ? ?HPI: Still with significant nausea and vomiting.  Blood pressure much improved.  Denies any swelling. ? ?ROS:  ?All other ROS reviewed and negative. Pertinent positives noted in the HPI.    ? ?Inpatient Medications  ?Scheduled Meds: ? amLODipine  10 mg Oral Daily  ? aspirin EC  162 mg Oral Daily  ? betamethasone acetate-betamethasone sodium phosphate  12 mg Intramuscular Q24 Hr x 2  ? clonazePAM  0.5 mg Oral BID  ? feeding supplement  1 Container Oral TID BM  ? hydrALAZINE  100 mg Oral TID  ? labetalol  200 mg Oral TID  ? levothyroxine  125 mcg Oral QAC breakfast  ? loratadine  10 mg Oral Daily  ? LORazepam  1 mg Intravenous Once  ? magnesium oxide  400 mg Oral BID  ? methylPREDNISolone (SOLU-MEDROL) injection  125 mg Intravenous Q12H  ? ondansetron  8 mg Oral Q8H  ? pantoprazole (PROTONIX) IV  40 mg Intravenous Daily  ? prenatal multivitamin  1 tablet Oral Q1200  ? venlafaxine XR  225 mg Oral Daily  ? ?Continuous Infusions: ? famotidine (PEPCID) IV 20 mg (07/06/21 0954)  ? lactated ringers 150 mL/hr at 07/06/21 0054  ? ondansetron [facility]) IV 8 mg (07/06/21 0441)  ? promethazine (PHENERGAN) injection (IM or IVPB) 25 mg (07/06/21 6195)  ? ?PRN Meds: ?acetaminophen, albuterol, calcium carbonate, cyclobenzaprine, diphenhydrAMINE, labetalol **AND** labetalol **AND** labetalol **AND** hydrALAZINE **AND** Measure blood pressure, hydrOXYzine, promethazine (PHENERGAN) injection (IM or IVPB), trimethobenzamide, zolpidem  ? ?Vital Signs  ? ?Vitals:  ? 07/05/21 1937 07/05/21 2337 07/06/21 0440 07/06/21 0738  ?BP: (!) 153/88 137/72 125/68 140/83  ?Pulse: 77 71 73 77  ?Resp: '18 18 18 18  '$ ?Temp: 97.8 ?F (36.6 ?C) 97.8 ?F (36.6 ?C) 98 ?F (36.7 ?C) 97.8 ?F (36.6 ?C)  ?TempSrc: Oral Oral Oral Oral  ?SpO2:  100% 100%  99%  ?Weight:   103.7 kg   ?Height:      ? ? ?Intake/Output Summary (Last 24 hours) at 07/06/2021 1045 ?Last data filed at 07/06/2021 0934 ?Gross per 24 hour  ?Intake 410 ml  ?Output 1200 ml  ?Net -790 ml  ? ? ?  07/06/2021  ?  4:40 AM 06/19/2021  ?  9:07 AM 05/23/2021  ? 10:37 AM  ?Last 3 Weights  ?Weight (lbs) 228 lb 9.6 oz 227 lb 9.6 oz 223 lb  ?Weight (kg) 103.692 kg 103.239 kg 101.152 kg  ?   ?Physical Exam  ? ?Vitals:  ? 07/05/21 1937 07/05/21 2337 07/06/21 0440 07/06/21 0738  ?BP: (!) 153/88 137/72 125/68 140/83  ?Pulse: 77 71 73 77  ?Resp: '18 18 18 18  '$ ?Temp: 97.8 ?F (36.6 ?C) 97.8 ?F (36.6 ?C) 98 ?F (36.7 ?C) 97.8 ?F (36.6 ?C)  ?TempSrc: Oral Oral Oral Oral  ?SpO2: 100% 100%  99%  ?Weight:   103.7 kg   ?Height:      ?  ?Intake/Output Summary (Last 24 hours) at 07/06/2021 1045 ?Last data filed at 07/06/2021 0934 ?Gross per 24 hour  ?Intake 410 ml  ?Output 1200 ml  ?Net -790 ml  ?  ? ?  07/06/2021  ?  4:40 AM 06/19/2021  ?  9:07 AM 05/23/2021  ? 10:37 AM  ?Last 3 Weights  ?Weight (lbs) 228 lb 9.6 oz 227  lb 9.6 oz 223 lb  ?Weight (kg) 103.692 kg 103.239 kg 101.152 kg  ?  Body mass index is 31.88 kg/m?.  ?General: Well nourished, well developed, in no acute distress ?Head: Atraumatic, normal size  ?Eyes: PEERLA, EOMI  ?Neck: Supple, no JVD ?Endocrine: No thryomegaly ?Cardiac: Normal S1, S2; RRR; no murmurs, rubs, or gallops ?Lungs: Clear to auscultation bilaterally, no wheezing, rhonchi or rales  ?Abd: Soft, nontender, no hepatomegaly  ?Ext: No edema, pulses 2+ ?Musculoskeletal: No deformities, BUE and BLE strength normal and equal ?Skin: Warm and dry, no rashes   ?Neuro: Alert and oriented to person, place, time, and situation, CNII-XII grossly intact, no focal deficits  ?Psych: Normal mood and affect  ? ?Labs  ?High Sensitivity Troponin:   ?Recent Labs  ?Lab 06/27/21 ?1637  ?TROPONINIHS 12  ?   ?Cardiac EnzymesNo results for input(s): TROPONINI in the last 168 hours. No results for input(s): TROPIPOC in the  last 168 hours.  ?Chemistry ?Recent Labs  ?Lab 07/02/21 ?1507 07/03/21 ?1228 07/05/21 ?1043 07/06/21 ?5093  ?NA 137 134* 138 134*  ?K 3.3* 3.4* 3.5 4.0  ?CL 106 105 108 107  ?CO2 24 23 21* 21*  ?GLUCOSE 79 108* 158* 144*  ?BUN '13 8 6 8  '$ ?CREATININE 0.68 0.63 0.75 0.74  ?CALCIUM 8.9 8.8* 8.8* 8.8*  ?PROT 6.0*  --   --  5.5*  ?ALBUMIN 3.3*  --   --  3.0*  ?AST 20  --   --  22  ?ALT 18  --   --  16  ?ALKPHOS 51  --   --  43  ?BILITOT 0.8  --   --  0.6  ?GFRNONAA >60 >60 >60 >60  ?ANIONGAP '7 6 9 6  '$ ?  ?Hematology ?Recent Labs  ?Lab 07/02/21 ?1507 07/06/21 ?2671  ?WBC 8.7 9.4  ?RBC 3.64* 3.22*  ?HGB 12.0 10.7*  ?HCT 33.6* 30.5*  ?MCV 92.3 94.7  ?MCH 33.0 33.2  ?MCHC 35.7 35.1  ?RDW 11.9 12.2  ?PLT 202 224  ? ?BNPNo results for input(s): BNP, PROBNP in the last 168 hours.  ?DDimer No results for input(s): DDIMER in the last 168 hours.  ? ?Radiology  ? ?Cardiac Studies  ?TTE 06/17/2021 ? 1. Global longitudinal strain is -20%. Left ventricular ejection  ?fraction, by estimation, is 60 to 65%. The left ventricle has normal  ?function. The left ventricle has no regional wall motion abnormalities.  ?There is mild left ventricular hypertrophy.  ?Left ventricular diastolic parameters were normal.  ? 2. Right ventricular systolic function is normal. The right ventricular  ?size is normal.  ? 3. Mild mitral valve regurgitation.  ? 4. The aortic valve is tricuspid. Aortic valve regurgitation is not  ?visualized. Aortic valve sclerosis is present, with no evidence of aortic  ?valve stenosis.  ? 5. The inferior vena cava is normal in size with greater than 50%  ?respiratory variability, suggesting right atrial pressure of 3 mmHg.  ? ?Patient Profile  ?Krystal [name] is a 31 y.o. female with hypothyroidism, chronic persistent hypertension, preeclampsia, anxiety, left hand amputation was admitted on 07/02/2021 for severe hypertension in the setting of pregnancy.  Currently [redacted] weeks pregnant. ? ?Assessment & Plan  ? ?#Chronic  hypertension ?#Pregnancy ?#History of preeclampsia ?-No signs of preeclampsia or help syndrome per review of records. ?-Now here with persistent nausea and vomiting. ?-Blood pressure is much improved on amlodipine 10 mg daily, hydralazine 100 mg 3 times daily. ?-Labetalol was decreased due to 100 mg  3 times daily.  This is due to nausea.  We could attempt to increase it back to 300 mg 3 times daily.  She still has nausea.  I do not believe this was contributing. ?-Cardiology will follow a chart review over the weekend.  We will follow back on Monday. ? ?For questions or updates, please contact [place] ?Please consult [email or web address] for contact info under  ?   ?Signed, ?[place] T. Audie Box, MD, [facility] ?[place]  [facility]  ?07/06/2021 10:45 AM  ? ?

## 2021-07-07 DIAGNOSIS — R519 Headache, unspecified: Secondary | ICD-10-CM | POA: Diagnosis not present

## 2021-07-07 DIAGNOSIS — F332 Major depressive disorder, recurrent severe without psychotic features: Secondary | ICD-10-CM | POA: Diagnosis not present

## 2021-07-07 DIAGNOSIS — F419 Anxiety disorder, unspecified: Secondary | ICD-10-CM | POA: Diagnosis not present

## 2021-07-07 DIAGNOSIS — F4310 Post-traumatic stress disorder, unspecified: Secondary | ICD-10-CM | POA: Diagnosis not present

## 2021-07-07 DIAGNOSIS — R112 Nausea with vomiting, unspecified: Secondary | ICD-10-CM | POA: Diagnosis not present

## 2021-07-07 DIAGNOSIS — G44221 Chronic tension-type headache, intractable: Secondary | ICD-10-CM

## 2021-07-07 DIAGNOSIS — F988 Other specified behavioral and emotional disorders with onset usually occurring in childhood and adolescence: Secondary | ICD-10-CM | POA: Diagnosis not present

## 2021-07-07 DIAGNOSIS — G43901 Migraine, unspecified, not intractable, with status migrainosus: Secondary | ICD-10-CM | POA: Diagnosis not present

## 2021-07-07 DIAGNOSIS — O26892 Other specified pregnancy related conditions, second trimester: Secondary | ICD-10-CM | POA: Diagnosis not present

## 2021-07-07 LAB — URINALYSIS, ROUTINE W REFLEX MICROSCOPIC
Bilirubin Urine: NEGATIVE
Glucose, UA: NEGATIVE mg/dL
Hgb urine dipstick: NEGATIVE
Ketones, ur: NEGATIVE mg/dL
Leukocytes,Ua: NEGATIVE
Nitrite: NEGATIVE
Protein, ur: 30 mg/dL — AB
Specific Gravity, Urine: 1.023 (ref 1.005–1.030)
pH: 6 (ref 5.0–8.0)

## 2021-07-07 MED ORDER — LIDOCAINE HCL 1 % IJ SOLN
10.0000 mL | Freq: Once | INTRAMUSCULAR | Status: AC
  Filled 2021-07-07: qty 10

## 2021-07-07 MED ORDER — DEXAMETHASONE SODIUM PHOSPHATE 10 MG/ML IJ SOLN
INTRAMUSCULAR | Status: AC
  Administered 2021-07-07: 10 mg
  Filled 2021-07-07: qty 1

## 2021-07-07 MED ORDER — CLONAZEPAM 1 MG PO TBDP: 1.0000 mg | ORAL_TABLET | Freq: Two times a day (BID) | ORAL | Status: DC

## 2021-07-07 MED ORDER — CLONAZEPAM 0.5 MG PO TBDP
1.0000 mg | ORAL_TABLET | Freq: Two times a day (BID) | ORAL | Status: DC
Start: 2021-07-07 — End: 2021-07-09
  Administered 2021-07-07 – 2021-07-09 (×4): 1 mg via ORAL
  Filled 2021-07-07 (×4): qty 2

## 2021-07-07 MED ORDER — DEXAMETHASONE SODIUM PHOSPHATE 10 MG/ML IJ SOLN: 4.0000 mg | Freq: Once | INTRAMUSCULAR | Status: DC

## 2021-07-07 MED ORDER — HYDROMORPHONE HCL 1 MG/ML IJ SOLN
1.0000 mg | Freq: Once | INTRAMUSCULAR | Status: AC
  Administered 2021-07-07: 1 mg via INTRAVENOUS
  Filled 2021-07-07: qty 1

## 2021-07-07 MED ORDER — DEXAMETHASONE SODIUM PHOSPHATE 10 MG/ML IJ SOLN
INTRAMUSCULAR | Status: AC
  Filled 2021-07-07: qty 1

## 2021-07-07 MED ORDER — DEXAMETHASONE SODIUM PHOSPHATE 10 MG/ML IJ SOLN: 4.0000 mg | Freq: Once | INTRAMUSCULAR | Status: AC

## 2021-07-07 MED ORDER — LIDOCAINE HCL (PF) 1 % IJ SOLN
INTRAMUSCULAR | Status: AC
  Administered 2021-07-07: 30 mL
  Filled 2021-07-07: qty 30

## 2021-07-07 MED ORDER — CLONAZEPAM 0.5 MG PO TABS: 1.0000 mg | ORAL_TABLET | Freq: Two times a day (BID) | ORAL | Status: DC

## 2021-07-07 NOTE — Plan of Care (Signed)
?  Problem: Education: Goal: Knowledge of disease or condition will improve Outcome: Progressing Goal: Knowledge of the prescribed therapeutic regimen will improve Outcome: Progressing Goal: Individualized Educational Video(s) Outcome: Progressing   Problem: Clinical Measurements: Goal: Complications related to the disease process, condition or treatment will be avoided or minimized Outcome: Progressing   Problem: Education: Goal: Knowledge of General Education information will improve Description: Including pain rating scale, medication(s)/side effects and non-pharmacologic comfort measures Outcome: Progressing   Problem: Health Behavior/Discharge Planning: Goal: Ability to manage health-related needs will improve Outcome: Progressing   Problem: Clinical Measurements: Goal: Ability to maintain clinical measurements within normal limits will improve Outcome: Progressing Goal: Will remain free from infection Outcome: Progressing Goal: Diagnostic test results will improve Outcome: Progressing Goal: Respiratory complications will improve Outcome: Progressing Goal: Cardiovascular complication will be avoided Outcome: Progressing   Problem: Activity: Goal: Risk for activity intolerance will decrease Outcome: Progressing   Problem: Nutrition: Goal: Adequate nutrition will be maintained Outcome: Progressing   Problem: Coping: Goal: Level of anxiety will decrease Outcome: Progressing   Problem: Elimination: Goal: Will not experience complications related to bowel motility Outcome: Progressing Goal: Will not experience complications related to urinary retention Outcome: Progressing   Problem: Pain Managment: Goal: General experience of comfort will improve Outcome: Progressing   Problem: Safety: Goal: Ability to remain free from injury will improve Outcome: Progressing   Problem: Skin Integrity: Goal: Risk for impaired skin integrity will decrease Outcome:  Progressing   

## 2021-07-07 NOTE — Progress Notes (Signed)
? ?Progress Note ? ?Patient Name: Krystal [name] ?Date of Encounter: 07/08/2021 ? ?[facility] Cardiologist: Freada Bergeron, MD  ? ?Subjective  ? ?Tearful this morning. Having significant RUQ pain. Continues to have nausea/vomiting and frontal HA. Reports she has not had a BM since last Wednesday ? ?BP in 140s/70-90s ?K low at 3.1, LFTs, bili normal ?RUQ ultrasound unremarkable; patient s/p cholecystectomy ?Underwent bilateral occipital nerve blocks for persistent HA ? ?Inpatient Medications  ?  ?Scheduled Meds: ? amLODipine  10 mg Oral Daily  ? aspirin EC  162 mg Oral Daily  ? clonazePAM  1 mg Oral BID  ? feeding supplement  1 Container Oral TID BM  ? hydrALAZINE  100 mg Oral TID  ? labetalol  300 mg Oral TID  ? levothyroxine  125 mcg Oral QAC breakfast  ? loratadine  10 mg Oral Daily  ? magnesium oxide  400 mg Oral BID  ? methylPREDNISolone (SOLU-MEDROL) injection  125 mg Intravenous Q12H  ? ondansetron  8 mg Oral Q8H  ? pantoprazole (PROTONIX) IV  40 mg Intravenous Daily  ? prenatal multivitamin  1 tablet Oral Q1200  ? venlafaxine XR  225 mg Oral Daily  ? ?Continuous Infusions: ? famotidine (PEPCID) IV 20 mg (07/07/21 2023)  ? lactated ringers 150 mL/hr at 07/08/21 0311  ? ondansetron [facility]) IV 8 mg (07/08/21 1610)  ? promethazine (PHENERGAN) injection (IM or IVPB) 25 mg (07/08/21 0036)  ? ?PRN Meds: ?acetaminophen, albuterol, calcium carbonate, cyclobenzaprine, diphenhydrAMINE, labetalol **AND** labetalol **AND** labetalol **AND** hydrALAZINE **AND** Measure blood pressure, hydrOXYzine, promethazine (PHENERGAN) injection (IM or IVPB), trimethobenzamide, zolpidem  ? ?Vital Signs  ?  ?Vitals:  ? 07/07/21 1640 07/07/21 1652 07/07/21 1956 07/08/21 0322  ?BP: (!) 173/116 (!) 143/104 (!) 147/92 (!) 141/77  ?Pulse: (!) 107 91 95 81  ?Resp:   20 20  ?Temp:   98.1 ?F (36.7 ?C) 97.9 ?F (36.6 ?C)  ?TempSrc:   Oral Oral  ?SpO2:   98% 100%  ?Weight:      ?Height:      ? ? ?Intake/Output Summary (Last 24 hours)  at 07/08/2021 0618 ?Last data filed at 07/07/2021 1843 ?Gross per 24 hour  ?Intake 1009.8 ml  ?Output 1400 ml  ?Net -390.2 ml  ? ? ?  07/07/2021  ?  6:37 AM 07/06/2021  ?  4:40 AM 06/19/2021  ?  9:07 AM  ?Last 3 Weights  ?Weight (lbs) 232 lb 228 lb 9.6 oz 227 lb 9.6 oz  ?Weight (kg) 105.235 kg 103.692 kg 103.239 kg  ?   ? ?Telemetry  ?  ?Not on tele - Personally Reviewed ? ?ECG  ?  ?No new tracing - Personally Reviewed ? ?Physical Exam  ? ?GEN: Uncomfortable appearing  ?Neck: No JVD ?Cardiac: RRR, no murmurs, rubs, or gallops.  ?Respiratory: Clear to auscultation bilaterally. ?GI: Gravid, nontender ?MS: No edema; No deformity. ?Neuro:  Nonfocal  ?Psych: Normal affect  ? ?Labs  ?  ?High Sensitivity Troponin:   ?Recent Labs  ?Lab 06/27/21 ?1637  ?TROPONINIHS 12  ?   ?Chemistry ?Recent Labs  ?Lab 07/02/21 ?1507 07/03/21 ?1228 07/05/21 ?1043 07/06/21 ?9604 07/08/21 ?5409  ?NA 137   < > 138 134* 137  ?K 3.3*   < > 3.5 4.0 3.1*  ?CL 106   < > 108 107 104  ?CO2 24   < > 21* 21* 21*  ?GLUCOSE 79   < > 158* 144* 133*  ?BUN 13   < > 6  8 10  ?CREATININE 0.68   < > 0.75 0.74 0.89  ?CALCIUM 8.9   < > 8.8* 8.8* 8.6*  ?PROT 6.0*  --   --  5.5* 5.5*  ?ALBUMIN 3.3*  --   --  3.0* 3.0*  ?AST 20  --   --  22 28  ?ALT 18  --   --  16 20  ?ALKPHOS 51  --   --  43 41  ?BILITOT 0.8  --   --  0.6 0.3  ?GFRNONAA >60   < > >60 >60 >60  ?ANIONGAP 7   < > '9 6 12  '$ ? < > = values in this interval not displayed.  ?  ?Lipids No results for input(s): CHOL, TRIG, HDL, LABVLDL, LDLCALC, CHOLHDL in the last 168 hours.  ?Hematology ?Recent Labs  ?Lab 07/02/21 ?1507 07/06/21 ?2297 07/08/21 ?9892  ?WBC 8.7 9.4 8.9  ?RBC 3.64* 3.22* 3.21*  ?HGB 12.0 10.7* 10.6*  ?HCT 33.6* 30.5* 30.8*  ?MCV 92.3 94.7 96.0  ?MCH 33.0 33.2 33.0  ?MCHC 35.7 35.1 34.4  ?RDW 11.9 12.2 12.0  ?PLT 202 224 181  ? ?Thyroid  ?Recent Labs  ?Lab 07/03/21 ?1228  ?TSH 2.154  ?FREET4 0.89  ?  ?BNP ?No results for input(s): BNP, PROBNP in the last 168 hours. ?  ?DDimer No results for  input(s): DDIMER in the last 168 hours.  ? ?Radiology  ?  ?No results found. ? ?Cardiac Studies  ? ?TTE 06/17/21 ? 1. Global longitudinal strain is -20%. Left ventricular ejection  ?fraction, by estimation, is 60 to 65%. The left ventricle has normal  ?function. The left ventricle has no regional wall motion abnormalities.  ?There is mild left ventricular hypertrophy.  ?Left ventricular diastolic parameters were normal.  ? 2. Right ventricular systolic function is normal. The right ventricular  ?size is normal.  ? 3. Mild mitral valve regurgitation.  ? 4. The aortic valve is tricuspid. Aortic valve regurgitation is not  ?visualized. Aortic valve sclerosis is present, with no evidence of aortic  ?valve stenosis.  ? 5. The inferior vena cava is normal in size with greater than 50%  ?respiratory variability, suggesting right atrial pressure of 3 mmHg.  ?  ? ?Patient Profile  ?   ?31 y.o. 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 P/C 0.11 without pre-elampsia currently. Cardiology is consulted for further management of severe HTN. ? ?Assessment & Plan  ?  ?#Chronic HTN: ?#Hypertension in Pregnancy: ?#History of Pre-Eclampsia and HELLP Syndrome: ?Patient with history of long-standing chronic hypertension that was initially diagnosed in her teens. Has had secondary work-up in the past which was reportedly normal. Previous pregnancies complicated by HELLP syndrome and pre-eclampsia. Patient has been followed throughout this pregnancy for severe HTN. BP control has been limited by persistent nausea and vomiting with inability to keep PO medications down. When she tolerates PO, her blood pressures are well  controlled and at goal with her regimen. Overall goal is to improve her nausea/emesis so that she can tolerate her PO medications. ?-Management of nausea/vomiting per primary; did not respond to IV steroids ?-Continue labetalol '300mg'$  TID  ?-Continue amlodipine '10mg'$  daily ?-Continue hydralazine '100mg'$  TID ?-Continue IV labetalol pushes as needed ?-Did not tolerate  nifedipine due to "jitteriness" ?-Not a good candidate for HCTZ due to risk of volume depletion and hypoK with nausea and vomiting ?-Methyldopa not available in [place] ?-Very high risk for developing pre-eclampsia/HELLP again ?-May ultimately require prolonged hospitalization for nausea/vomiting and BP control ? ?#Persistent Nausea/Vomiting: ?Has persisted despite IV steroids, zofran and phenergan.  ?-Management per OB ? ?#Hypokalemia: ?Secondary to persistent nausea/vomiting. Will replete. ? ?#Headache: ?CT/MRI/MRV head without evidence of stroke. Has been resistant to multiple medical therapies including steroids, flexiril, benadryl, tylenol. Underwent occipital nerve blocks yesterday. Neuro is following. ?-Management per OBGYN and Neurology ? ?#RUQ Pain: ?RUQ ultrasound unremarkable. Patient has had prior cholecystectomy. LFTs and bili remain normal. ? If constipation contributing as has not had BM since last Wednesday.  ?-Management per OBGYN ?-Started miralax  ? ? ?   ?For questions or updates, please contact [place] ?Please consult [email or web address] for contact info under  ? ?  ?   ?Signed, ?Freada Bergeron, MD  ?07/08/2021, 6:18 AM    ?
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
## 2021-07-07 NOTE — Progress Notes (Signed)
NEUROLOGY CONSULTATION PROGRESS NOTE  ? ?Date of service: Jul 07, 2021 ?Patient Name: Krystal [name] ?MRN:  [id number] ?DOB:  1990/06/11 ? ?Brief HPI  ? ?Krystal [name] is a 31 y.o. female [phone] with IUP at  24 weeks with a PMH significant for HELLP (12/08/2017), Migraines, Severe uncontrolled hypertension (10/08/2017), who presents with persistent headache, unchanged x4 weeks. We consulted on her on previous admission one month ago as well. MRI brain / MRA head / MRV head all negative. Neurology asked to reconsult for persistent headache. ?  ?Interval Hx  ? ?Headache persists, unchanged. Reports severe nausea with multiple bouts of emesis daily. No response to IV solumedrol. Reports scintillating scotomas, blurry vision. No response to sumatriptan yesterday. Amenable to bilateral occipital nerve blocks today. ? ?Vitals  ? ?Vitals:  ? 07/06/21 2332 07/07/21 0452 07/07/21 0093 07/07/21 0756  ?BP: 139/80 132/76  (!) 145/89  ?Pulse: 96 73  72  ?Resp: '16 18  18  '$ ?Temp: 97.9 ?F (36.6 ?C) 97.8 ?F (36.6 ?C)  98 ?F (36.7 ?C)  ?TempSrc: Oral Oral  Oral  ?SpO2: 100% 100%  99%  ?Weight:   105.2 kg   ?Height:      ?  ? ?Body mass index is 32.36 kg/m?. ? ?Physical Exam  ? ?General: lying in bed, reports discomfort ?HENT: Normal oropharynx and mucosa. Normal external appearance of ears and nose.  ?Neck: Supple, no pain or tenderness  ?CV: No JVD. No peripheral edema.  ?Pulmonary: Symmetric Chest rise. Normal respiratory effort.  ?Ext: No cyanosis, edema, or deformity, left arm amputation  ?Skin: No rash. Normal palpation of skin.   ?Musculoskeletal: Normal digits and nails by inspection. No clubbing.  ? ?Neurologic Examination  ?Mental status/Cognition: Alert, oriented to self, place, month and year, good attention.  ?Speech/language: Fluent, comprehension intact, object naming intact, repetition intact.  ?Cranial nerves:  ? CN II Right pupil minimally more dilated than left with postive light reflex and accomodation,  minimal right temporal peripheral vision deficits   ? CN III,IV,VI EOM intact, no gaze preference or deviation, no nystagmus   ? CN V normal sensation in V1, V2, and V3 segments bilaterally   ? CN VII no asymmetry, no nasolabial fold flattening   ? CN VIII normal hearing to speech   ? CN IX & X normal palatal elevation, no uvular deviation   ? CN XI 5/5 head turn and 5/5 shoulder shrug bilaterally   ? CN XII midline tongue protrusion   ? ?Motor:  ?Muscle bulk: normal, tone normal, pronator drift negative, tremor absent ?5/5 Strength in bilateral upper and lower extremities, LUE below elbow amputation ? ?Sensation: ?Grossly normal sensation in bilateral upper and lower extremities.  ? ?Coordination/Complex Motor:  ?- Finger to Nose intact ? ? ?Labs  ? ?Basic Metabolic Panel:  ?Lab Results  ?Component Value Date  ? NA 134 (L) 07/06/2021  ? K 4.0 07/06/2021  ? CO2 21 (L) 07/06/2021  ? GLUCOSE 144 (H) 07/06/2021  ? BUN 8 07/06/2021  ? CREATININE 0.74 07/06/2021  ? CALCIUM 8.8 (L) 07/06/2021  ? GFRNONAA >60 07/06/2021  ? GFRAA >60 12/18/2017  ? ?HbA1c:  ?Lab Results  ?Component Value Date  ? HGBA1C 5.1 04/01/2021  ? ?LDL: No results found for: LDLCALC ?Urine Drug Screen:  ?   ?Component Value Date/Time  ? [place] DETECTED 11/18/2020 2140  ? [place] DETECTED 11/18/2020 2140  ? LABBENZ POSITIVE (A) 11/18/2020 2140  ? [place] DETECTED 11/18/2020 2140  ?  THCU POSITIVE (A) 11/18/2020 2140  ? [place] DETECTED 11/18/2020 2140  ?  ?Alcohol Level  ?   ?Component Value Date/Time  ? ETH <10 11/18/2020 1745  ? ?No results found for: PHENYTOIN, ZONISAMIDE, LAMOTRIGINE, LEVETIRACETA ?No results found for: PHENYTOIN, PHENOBARB, VALPROATE, CBMZ ? ?Imaging and Diagnostic studies  ?Results for orders placed during the hospital encounter of 07/02/21 ? ?MR ANGIO HEAD WO CONTRAST ? ?Narrative ?CLINICAL DATA:  Initial evaluation for acute headache, currently ?pregnant. ? ?EXAM: ?MRI HEAD WITHOUT CONTRAST ? ?MRA HEAD  WITHOUT CONTRAST ? ?MRV HEAD WITHOUT CONTRAST ? ?TECHNIQUE: ?Multiplanar, multi-echo pulse sequences of the brain and surrounding ?structures were acquired without intravenous contrast. Angiographic ?images of the Circle of Willis were acquired using MRA technique ?without intravenous contrast. Additional dedicated MRV images ?without contrast were also obtained. ? ?COMPARISON:  Prior CT from earlier the same day. ? ?FINDINGS: ?MRI HEAD FINDINGS ? ?Brain: Examination mildly degraded by motion. ? ?Cerebral volume within normal limits. No focal parenchymal signal ?abnormality. No evidence for acute or subacute infarct. Gray-white ?matter differentiation maintained. No areas of chronic cortical ?infarction. No acute or chronic intracranial blood products. ? ?No mass lesion, midline shift or mass effect. No hydrocephalus or ?extra-axial fluid collection. Pituitary gland suprasellar region ?normal. Midline structures intact and normally formed. ? ?Vascular: Major intracranial vascular flow voids are well ?maintained. ? ?Skull and upper cervical spine: Craniocervical junction within ?normal limits. Bone marrow signal intensity normal. No scalp soft ?tissue abnormality. ? ?Sinuses/Orbits: Globes and orbital soft tissues within normal ?limits. Mild scattered mucosal thickening noted about the ethmoidal ?air cells and maxillary sinuses. Paranasal sinuses are otherwise ?clear. No significant mastoid effusion. ? ?Other: None. ? ?MRA HEAD FINDINGS ? ?Anterior circulation: Both internal carotid arteries widely patent ?to the termini without stenosis. A1 segments widely patent. Normal ?anterior communicating artery complex. Both anterior cerebral ?arteries widely patent to their distal aspects without stenosis. No ?M1 stenosis or occlusion. Normal MCA bifurcations. Distal MCA ?branches well perfused and symmetric. ? ?Posterior circulation: Both V4 segments widely patent without ?stenosis. Right PICA patent. Left PICA origin not  visualized. ?Basilar patent without stenosis. Superior cerebral arteries patent ?bilaterally. Right PCA supplied via the basilar. Left PCA supplied ?via a hypoplastic left P1 segment and robust left posterior ?communicating artery. Both PCAs well perfused or distal aspects ?without stenosis. ? ?Anatomic variants: As above. No intracranial aneurysm or other ?vascular malformation. ? ?MRV HEAD FINDINGS ? ?Normal flow related signal seen throughout the superior sagittal ?sinus to the torcula. Transverse and sigmoid sinuses are patent as ?are the jugular bulbs and visualized proximal internal jugular ?veins. Straight sinus, vein of Galen, internal cerebral veins, basal ?veins of Rosenthal appear patent. No appreciable abnormality about ?the cavernous sinus. No visible cortical vein thrombosis. No dural ?venous sinus thrombosis. ? ?IMPRESSION: ?1. Normal MRI of the brain. No acute intracranial abnormality ?identified. ?2. Normal intracranial MRA. ?3. Normal intracranial MRV. No evidence for dural venous sinus ?thrombosis. ? ? ?Electronically Signed ?By: Jeannine Boga M.D. ?On: 07/02/2021 19:32 ? ? ?Impression  ? ?Krystal [name] is a 31 y.o. female [phone] with IUP at  23 weeks with a PMH significant for HELLP (12/08/2017), Migraines, Severe uncontrolled hypertension (10/08/2017), who presents with persistent headache, unchanged x4 weeks. We consulted on her on previous admission one month ago as well. Neurology asked to reconsult for persistent headache.  ? ?Patient has intractable status migrainosus with aura. No red flag sx. Headache has not worsened or changed in character, but  is refractory. She has tried and failed numerous therapies including promethazine, zofran, benadryl, IV solumedrol, labetalol, amlodipine, venlafaxine, opiates, sumatriptan. ? ?Allergic to compazine, reglan, duloxetine, amitriptyline.  ? ?Dr. Erlinda Hong has kindly agreed to come over and do bilateral occipital nerve blocks for her today. I  think this is certainly the best remaining option we have to offer her to break this. I would also highly recommend cognitive behavioral therapy /stress mgmt/relaxation training if available inpatient.

## 2021-07-07 NOTE — Progress Notes (Signed)
Patient ID: Krystal [name], female   DOB: 1990/05/20, 31 y.o.   MRN: [id number] ?FACULTY PRACTICE ANTEPARTUM(COMPREHENSIVE) NOTE ? ?Krystal [name] is a 31 y.o. [id number] with Estimated Date of Delivery: 10/26/21   By  early ultrasound [redacted]w[redacted]d who is admitted for BP management.   ? ?Fetal presentation is cephalic. ?Length of Stay:  5  Days  Date of admission:07/02/2021 ? ?Subjective: ?Feels better kept orals down. Placed on clear liquids. ? ?Per nursing most emesis happens during a panic attack (not all though). She did not report a HA to me at this time, but is now concerned about her abdominal pain. It reminds her of the pain she had before her delivery of her first baby (When she had HELLP syndrome - we reviewed her labs were completely normal yesterday but that we will be checking labs regularly).  ? ?Patient reports the fetal movement as active. ?Patient reports uterine contraction  activity as none. ?Patient reports  vaginal bleeding as none. ?Patient describes fluid per vagina as None. ? ?Vitals:  Blood pressure (!) 145/89, pulse 72, temperature 98 ?F (36.7 ?C), temperature source Oral, resp. rate 18, height '5\' 11"'$  (1.803 m), weight 105.2 kg, last menstrual period 01/19/2021, SpO2 99 %, not currently breastfeeding. ?Vitals:  ? 07/06/21 2332 07/07/21 0452 07/07/21 [id number]/14/23 0756  ?BP: 139/80 132/76  (!) 145/89  ?Pulse: 96 73  72  ?Resp: '16 18  18  '$ ?Temp: 97.9 ?F (36.6 ?C) 97.8 ?F (36.6 ?C)  98 ?F (36.7 ?C)  ?TempSrc: Oral Oral  Oral  ?SpO2: 100% 100%  99%  ?Weight:   105.2 kg   ?Height:      ? ?Physical Examination: ? General appearance - alert, well appearing, and in no distress ?Abdomen - soft, nontender, nondistended, no masses or organomegaly ?Fundal Height:  size less than dates ?Pelvic Exam:  examination not indicated ?Cervical Exam: Not evaluated.  ?Extremities: extremities normal, atraumatic, no cyanosis or edema with DTRs 2+ bilaterally ?Membranes:intact ? ?Fetal Monitoring:  150s appropriate for  GA    ? ?Labs:  ?Results for orders placed or performed during the hospital encounter of 07/02/21 (from the past 24 hour(s))  ?Protein / creatinine ratio, urine  ? Collection Time: 07/06/21  3:36 PM  ?Result Value Ref Range  ? Creatinine, Urine 155.50 mg/dL  ? Total Protein, Urine 26 mg/dL  ? Protein Creatinine Ratio 0.17 (H) 0.00 - 0.15 mg/mg[Cre]  ? ? ?Imaging Studies:    ?  ? ?Medications:  Scheduled ? amLODipine  10 mg Oral Daily  ? aspirin EC  162 mg Oral Daily  ? clonazePAM  0.5 mg Oral BID  ? feeding supplement  1 Container Oral TID BM  ? hydrALAZINE  100 mg Oral TID  ? labetalol  300 mg Oral TID  ? levothyroxine  125 mcg Oral QAC breakfast  ? loratadine  10 mg Oral Daily  ? magnesium oxide  400 mg Oral BID  ? methylPREDNISolone (SOLU-MEDROL) injection  125 mg Intravenous Q12H  ? ondansetron  8 mg Oral Q8H  ? pantoprazole (PROTONIX) IV  40 mg Intravenous Daily  ? prenatal multivitamin  1 tablet Oral Q1200  ? venlafaxine XR  225 mg Oral Daily  ? ?I have reviewed the patient's current medications. ? ?ASSESSMENT: ?[id number] Date of Delivery: 10/26/21  ?Principal Problem: ?  IUGR, antenatal ?Active Problems: ?  History of HELLP syndrome, currently pregnant ?  Essential hypertension ?  History of preterm delivery, currently pregnant ?  Anisocoria ?  Chronic hypertension with exacerbation during pregnancy in second trimester ?  Poor fetal growth affecting management of mother in second trimester ?  Headache in pregnancy, antepartum, second trimester ?  [redacted] weeks gestation of pregnancy ? ?PLAN: ?Refractory/Labile CHTN ?- Current medications: Amlodipine 10, Hydral 100 Q8 and Labetalol 300Q8.  ?- At this time, does not appear c/w PreE - P/C ratio 0.11, and other PreE labs wnl. 24 hr urine was 170 (normal) ?- Appreciate cardiology input and agree that when pt is able to tolerate PO and keep medications down her blood pressure is well controlled.  ? ?3. Persistent HA ?- Appreciate neuro input with options ?-  Reviewed at length with her today risk of neonatal withdrawal and the importance of avoiding dilaudid for HA (among other reasons as discussed with multiple providers including myself). We discussed that this would not be great for the baby in addition to the multiple other risk factors: fetal growth restriction, ToF, likely prematurity. Pt voiced understanding. Encouraged use of tylenol and flexeril prior to use of anything else. ?- Encouraged proceeding with nerve block if HA persistent today.  ? ?2.  N/V, refractory  ?- high dose methylprednisolone x 48 hours then titrate with oral prednisone ?- Weight today is 105kg up from 103.7. Will continue daily weights.  ? ?3.  FWB ?- NST daily ?- BMZ 5/12-13 ?- ToF by fetal echo - NICU aware and s/p consult ?- FGR with elevated dopplers also noted. 5/31 is next planned imaging for growth and dopplers.  ? ?5. Anxiety/Depression ?- Greatly appreciate psychiatry team input ?- Plan to continue Venlafaxine, klonipin and atarax.  ?- Can see tomorrow about possible resources for inpatient therapy ? ?6. Routine OB ?- She desires permanent sterilization.  ?- Discussed surgery of salpingectomy vs tubal ligation. She would like to do a salpingectomy.  ?- Discussed risks: bleeding, infection, injury to surrounding organs/tissues.  ?- Insurance is not medicaid - does not need tubal papers. Will confirm with Jordan, just in case.  ? ? ? ?Radene Gunning ?07/07/2021,10:15 AM ? ? ? ? ? ?

## 2021-07-07 NOTE — Progress Notes (Signed)
Pt requested physician to bedside.  Once at her bedside, pt started to cry.  She reports abdominal pain.  She is requesting stronger pain medication.  Reviewed current medication with RN- given tylenol and flexeril 1517.  She did have a small amount of emesis about 1hr later. ? ?O: BP (!) 143/104   Pulse 91   Temp 98.4 ?F (36.9 ?C) (Axillary)   Resp 16   Ht '5\' 11"'$  (1.803 m)   Wt 105.2 kg   LMP 01/19/2021   SpO2 100%   BMI 32.36 kg/m?  ? ?Pt declined abdominal exam. ? ?Currently BP elevated as she has refused oral medication.  Plan for IV Labetalol now and encouraged pt to take oral anti-hypertensives.  Reviewed importance of good BP control to help avoid return of her headache.  Based on recent lab work and recent US no acute abdominal abnormalities noted.  Suspect some abdominal discomfort likely due to nausea/vomiting.  Encouraged pt to use heating pack and given tylenol some more time. ? ?Continue with plan as previously outlined by Dr. Damita Dunnings ? ?Janyth Pupa, DO ?Attending [place] ?[facility] ? ? ?

## 2021-07-08 ENCOUNTER — Telehealth: Payer: Managed Care, Other (non HMO) | Admitting: Obstetrics & Gynecology

## 2021-07-08 DIAGNOSIS — O10912 Unspecified pre-existing hypertension complicating pregnancy, second trimester: Secondary | ICD-10-CM | POA: Diagnosis not present

## 2021-07-08 DIAGNOSIS — G43901 Migraine, unspecified, not intractable, with status migrainosus: Secondary | ICD-10-CM | POA: Diagnosis not present

## 2021-07-08 DIAGNOSIS — O09299 Supervision of pregnancy with other poor reproductive or obstetric history, unspecified trimester: Secondary | ICD-10-CM | POA: Diagnosis not present

## 2021-07-08 LAB — ENA+DNA/DS+ANTICH+CENTRO+JO...
Anti JO-1: <0.2 AI (ref 0.0–0.9)
Centromere Ab Screen: <0.2 AI (ref 0.0–0.9)
Chromatin Ab SerPl-aCnc: <0.2 AI (ref 0.0–0.9)
ENA SM Ab Ser-aCnc: <0.2 AI (ref 0.0–0.9)
Ribonucleic Protein: 1.5 AI — ABNORMAL HIGH (ref 0.0–0.9)
SSA (Ro) (ENA) Antibody, IgG: <0.2 AI (ref 0.0–0.9)
SSB (La) (ENA) Antibody, IgG: <0.2 AI (ref 0.0–0.9)
Scleroderma (Scl-70) (ENA) Antibody, IgG: <0.2 AI (ref 0.0–0.9)
ds DNA Ab: <1 IU/mL (ref 0–9)

## 2021-07-08 LAB — COMPREHENSIVE METABOLIC PANEL
ALT: 20 U/L (ref 0–44)
AST: 28 U/L (ref 15–41)
Albumin: 3 g/dL — ABNORMAL LOW (ref 3.5–5.0)
Alkaline Phosphatase: 41 U/L (ref 38–126)
Anion gap: 12 (ref 5–15)
BUN: 10 mg/dL (ref 6–20)
CO2: 21 mmol/L — ABNORMAL LOW (ref 22–32)
Calcium: 8.6 mg/dL — ABNORMAL LOW (ref 8.9–10.3)
Chloride: 104 mmol/L (ref 98–111)
Creatinine, Ser: 0.89 mg/dL (ref 0.44–1.00)
GFR, Estimated: >60 mL/min (ref >60)
Glucose, Bld: 133 mg/dL — ABNORMAL HIGH (ref 70–99)
Potassium: 3.1 mmol/L — ABNORMAL LOW (ref 3.5–5.1)
Sodium: 137 mmol/L (ref 135–145)
Total Bilirubin: 0.3 mg/dL (ref 0.3–1.2)
Total Protein: 5.5 g/dL — ABNORMAL LOW (ref 6.5–8.1)

## 2021-07-08 LAB — AMYLASE: Amylase: 26 U/L — ABNORMAL LOW (ref 28–100)

## 2021-07-08 LAB — CBC
HCT: 30.8 % — ABNORMAL LOW (ref 36.0–46.0)
Hemoglobin: 10.6 g/dL — ABNORMAL LOW (ref 12.0–15.0)
MCH: 33 pg (ref 26.0–34.0)
MCHC: 34.4 g/dL (ref 30.0–36.0)
MCV: 96 fL (ref 80.0–100.0)
Platelets: 181 10*3/uL (ref 150–400)
RBC: 3.21 MIL/uL — ABNORMAL LOW (ref 3.87–5.11)
RDW: 12 % (ref 11.5–15.5)
WBC: 8.9 10*3/uL (ref 4.0–10.5)
nRBC: 0.2 % (ref 0.0–0.2)

## 2021-07-08 LAB — URINE CULTURE: Culture: <10000 — AB

## 2021-07-08 LAB — ANA W/REFLEX IF POSITIVE: Anti Nuclear Antibody (ANA): POSITIVE — AB

## 2021-07-08 LAB — LIPASE, BLOOD: Lipase: 22 U/L (ref 11–51)

## 2021-07-08 MED ORDER — POLYETHYLENE GLYCOL 3350 17 G PO PACK
17.0000 g | PACK | Freq: Every day | ORAL | Status: DC | PRN
  Administered 2021-07-09: 17 g via ORAL
  Filled 2021-07-08 (×2): qty 1

## 2021-07-08 MED ORDER — ALUM & MAG HYDROXIDE-SIMETH 200-200-20 MG/5ML PO SUSP
30.0000 mL | Freq: Once | ORAL | Status: AC
  Administered 2021-07-08: 30 mL via ORAL
  Filled 2021-07-08: qty 30

## 2021-07-08 MED ORDER — DICYCLOMINE HCL 10 MG/ML IM SOLN
20.0000 mg | Freq: Once | INTRAMUSCULAR | Status: AC
  Administered 2021-07-08: 20 mg via INTRAMUSCULAR
  Filled 2021-07-08: qty 2

## 2021-07-08 MED ORDER — POTASSIUM CHLORIDE 20 MEQ PO PACK
40.0000 meq | PACK | Freq: Two times a day (BID) | ORAL | Status: AC
  Administered 2021-07-08 (×2): 40 meq via ORAL
  Filled 2021-07-08 (×2): qty 2

## 2021-07-08 MED ORDER — DICYCLOMINE HCL 20 MG PO TABS
20.0000 mg | ORAL_TABLET | Freq: Three times a day (TID) | ORAL | Status: DC
  Administered 2021-07-08 – 2021-07-16 (×23): 20 mg via ORAL
  Filled 2021-07-08 (×28): qty 1

## 2021-07-08 NOTE — Progress Notes (Signed)
Subjective: ?Continues to have headache pain.  ? ?Objective: ?Current vital signs: ?BP (!) 141/77 (BP Location: Left Arm)   Pulse 81   Temp 97.9 ?F (36.6 ?C) (Oral)   Resp 20   Ht '5\' 11"'$  (1.803 m)   Wt 105.2 kg   LMP 01/19/2021   SpO2 100%   BMI 32.36 kg/m?  ?Vital signs in last 24 hours: ?Temp:  [97.8 ?F (36.6 ?C)-98.4 ?F (36.9 ?C)] 97.9 ?F (36.6 ?C) (05/15 0322) ?Pulse Rate:  [73-107] 81 (05/15 0322) ?Resp:  [16-20] 20 (05/15 0322) ?BP: (138-173)/(77-116) 141/77 (05/15 0322) ?SpO2:  [98 %-100 %] 100 % (05/15 0322) ? ?Intake/Output from previous day: ?05/14 0701 - 05/15 0700 ?In: 1009.8 [I.V.:855.8; IV Piggyback:154] ?Out: 1250 [Urine:750; Emesis/NG output:500] ?Intake/Output this shift: ?No intake/output data recorded. ?Nutritional status:  ?Diet Order   ? ?       ?  Diet regular Room service appropriate? Yes; Fluid consistency: Thin  Diet effective now       ?  ? ?  ?  ? ?  ? ?HEENT: [place]/AT. Some tenderness to palpation of posterior nuchal musculature at base of skull bilaterally. Some improvement to pain with light palpation of trapezii and posterior nuchal musculature at base of neck. Slight improvement of pain with palpation of temporalis muscles bilaterally.  ?Ext: Right upper extremity post-op changes (chronic). 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  ? ?Neurologic Exam: ?Ment: Awake, alert and oriented. Speech fluent with intact comprehension and naming.  ?CN: EOMI. Face symmetric. Phonation intact.  ?Motor: 5/5 x 4 ?Reflexes: 2+ patellar reflexes ?Cerebellar: No ataxia noted.  ? ?Lab Results: ?Results for orders placed or performed during the hospital encounter of 07/02/21 (from the past 48 hour(s))  ?Protein / creatinine ratio, urine     Status: Abnormal  ? Collection Time: 07/06/21  3:36 PM  ?Result Value Ref Range  ? Creatinine, Urine 155.50 mg/dL  ? Total Protein, Urine 26 mg/dL  ?  Comment: NO NORMAL RANGE ESTABLISHED FOR THIS TEST  ? Protein Creatinine Ratio  0.17 (H) 0.00 - 0.15 mg/mg[Cre]  ?  Comment: Performed at [facility], [place] [address], [place], [place] [address]  ?Urinalysis, Routine w reflex microscopic Urine, Clean Catch     Status: Abnormal  ? Collection Time: 07/07/21 12:25 PM  ?Result Value Ref Range  ? Color, Urine YELLOW YELLOW  ? APPearance HAZY (A) CLEAR  ? Specific Gravity, Urine 1.023 1.005 - 1.030  ? pH 6.0 5.0 - 8.0  ? Glucose, UA NEGATIVE NEGATIVE mg/dL  ? Hgb urine dipstick NEGATIVE NEGATIVE  ? Bilirubin Urine NEGATIVE NEGATIVE  ? Ketones, ur NEGATIVE NEGATIVE mg/dL  ? Protein, ur 30 (A) NEGATIVE mg/dL  ? Nitrite NEGATIVE NEGATIVE  ? Leukocytes,Ua NEGATIVE NEGATIVE  ? RBC / HPF 0-5 0 - 5 RBC/hpf  ? WBC, UA 0-5 0 - 5 WBC/hpf  ? Bacteria, UA RARE (A) NONE SEEN  ? Squamous Epithelial / LPF 6-10 0 - 5  ? Mucus PRESENT   ?  Comment: Performed at [facility], [place] [address] [place], [place] [address]  ?Comprehensive metabolic panel     Status: Abnormal  ? Collection Time: 07/08/21  4:12 AM  ?Result Value Ref Range  ? Sodium 137 135 - 145 mmol/L  ? Potassium 3.1 (L) 3.5 - 5.1 mmol/L  ?  Comment: DELTA CHECK NOTED  ? Chloride 104 98 - 111 mmol/L  ? CO2 21 (L) 22 -  32 mmol/L  ? Glucose, Bld 133 (H) 70 - 99 mg/dL  ?  Comment: Glucose reference range applies only to samples taken after fasting for at least 8 hours.  ? BUN 10 6 - 20 mg/dL  ? Creatinine, Ser 0.89 0.44 - 1.00 mg/dL  ? Calcium 8.6 (L) 8.9 - 10.3 mg/dL  ? Total Protein 5.5 (L) 6.5 - 8.1 g/dL  ? Albumin 3.0 (L) 3.5 - 5.0 g/dL  ? AST 28 15 - 41 U/L  ? ALT 20 0 - 44 U/L  ? Alkaline Phosphatase 41 38 - 126 U/L  ? Total Bilirubin 0.3 0.3 - 1.2 mg/dL  ? GFR, Estimated >60 >60 mL/min  ?  Comment: (NOTE) ?Calculated using the CKD-EPI Creatinine Equation (2021) ?  ? Anion gap 12 5 - 15  ?  Comment: Performed at [facility], [place] [address], [place], [place] [address]  ?CBC     Status: Abnormal  ? Collection Time: 07/08/21  4:12 AM  ?Result Value Ref Range  ? WBC 8.9 4.0 - 10.5 K/uL  ?  RBC 3.21 (L) 3.87 - 5.11 MIL/uL  ? Hemoglobin 10.6 (L) 12.0 - 15.0 g/dL  ? HCT 30.8 (L) 36.0 - 46.0 %  ? MCV 96.0 80.0 - 100.0 fL  ? MCH 33.0 26.0 - 34.0 pg  ? MCHC 34.4 30.0 - 36.0 g/dL  ? RDW 12.0 11.5 - 15.5 %  ? Platelets 181 150 - 400 K/uL  ? nRBC 0.2 0.0 - 0.2 %  ?  Comment: Performed at [facility], [place] [address] [place], [place] [address]  ? ? ?No results found for this or any previous visit (from the past 240 hour(s)). ? ?Lipid Panel ?No results for input(s): CHOL, TRIG, HDL, CHOLHDL, VLDL, LDLCALC in the last 72 hours. ? ?Studies/Results: ?No results found. ? ?Medications: Scheduled: ? amLODipine  10 mg Oral Daily  ? aspirin EC  162 mg Oral Daily  ? clonazePAM  1 mg Oral BID  ? feeding supplement  1 Container Oral TID BM  ? hydrALAZINE  100 mg Oral TID  ? labetalol  300 mg Oral TID  ? levothyroxine  125 mcg Oral QAC breakfast  ? loratadine  10 mg Oral Daily  ? magnesium oxide  400 mg Oral BID  ? methylPREDNISolone (SOLU-MEDROL) injection  125 mg Intravenous Q12H  ? ondansetron  8 mg Oral Q8H  ? pantoprazole (PROTONIX) IV  40 mg Intravenous Daily  ? prenatal multivitamin  1 tablet Oral Q1200  ? venlafaxine XR  225 mg Oral Daily  ? ?Continuous: ? famotidine (PEPCID) IV 20 mg (07/07/21 2023)  ? lactated ringers 150 mL/hr at 07/08/21 0311  ? ondansetron [facility]) IV 8 mg (07/08/21 9675)  ? promethazine (PHENERGAN) injection (IM or IVPB) 25 mg (07/08/21 0636)  ? ?Assessment: 31 y.o. 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 We saw her in consultation during an  admission one month ago as well. Neurology was asked to reconsult for persistent headache.  ?- Received bilateral occipital nerve blocks yesterday, performed successfully by Dr. Erlinda Hong. Patient states that her occipital headache pain has  decreased somewhat from 7/10 yesterday to 6/10 today. She continues to have a bifrontal throbbing headache as well, rated as 7/10.  ?- Patient has intractable status migrainosus with aura. No red flag sx. Headache has not worsened or changed in character, but is refractory. She has tried and failed numerous therapies including promethazine, zofran, benadryl, IV solumedrol, labetalol, amlodipine, venlafaxine, opiates, sumatriptan. ?- Allergic to compazine, reglan, duloxetine, amitriptyline.  ?- Recommend massage therapy to shoulders, neck and scalp. Patient will only tolerate light pressure based on exam today, but stated that pain was slightly alleviated from light shoulder, neck and scalp palpation during exam today.  ?- We would also highly recommend cognitive behavioral therapy /stress mgmt/relaxation training if available inpatient. If she is discharged prior to delivery would recommend referral for biofeedback. ?  ?  ?Recommendations  ?  ?# Status migrainosus with aura in pregnancy ?- Cognitive behavioral therapy /stress mgmt/relaxation training if available inpatient. If she is discharged prior to delivery would recommend referral for biofeedback. ?- Other nonpharmacologic interventions: heat, ice, massage, rest, avoiding triggers (dietary etc) ?- Recommend massage therapy to shoulders, neck and scalp. Patient will only tolerate light pressure based on exam today, but stated that pain was slightly alleviated from light shoulder, neck and scalp palpation during exam today.  ?- Continue to monitor to assess for possible further improvement following her occipital nerve blocks yesterday.  ?  ?She has been extremely difficult to manage and has a headache lasting weeks therefore per  Dr. Josefine Class request Dr. Quinn Axe has summarized reasonable future treatment options here if her headaches persist or recur after sumatriptan and/or nerve blocks: ?  ?# Acute tx refractory migraine headache ?- Tylenol

## 2021-07-08 NOTE — Progress Notes (Signed)
Patient ID: Krystal [name], female   DOB: 1990/10/25, 31 y.o.   MRN: [id number] ?FACULTY PRACTICE ANTEPARTUM(COMPREHENSIVE) NOTE ? ?Ghada Abbett Joswick is a 31 y.o. [id number] with Estimated Date of Delivery: 10/26/21   By  early ultrasound [redacted]w[redacted]d who is admitted for BP management.   ? ?Fetal presentation is unsure. ?Length of Stay:  6  Days  Date of admission:07/02/2021 ? ?Subjective: ?Pt was improved with nausea vomiting but over the past 36 hours has had RUQ pain, intermittent previously, now constant with N/V ?Patient reports the fetal movement as active. ?Patient reports uterine contraction  activity as none. ?Patient reports  vaginal bleeding as none. ?Patient describes fluid per vagina as None. ? ?Vitals:  Blood pressure (!) 153/82, pulse 89, temperature 98.5 ?F (36.9 ?C), temperature source Oral, resp. rate (!) 23, height '5\' 11"'$  (1.803 m), weight 105.2 kg, last menstrual period 01/19/2021, SpO2 99 %, not currently breastfeeding. ?Vitals:  ? 07/08/21 0855 07/08/21 0920 07/08/21 1210 07/08/21 1225  ?BP: (!) 162/85 (!) 157/93 (!) 163/84 (!) 153/82  ?Pulse: 90 99 99 89  ?Resp: (!) 21  (!) 23   ?Temp: 98.3 ?F (36.8 ?C)  98.5 ?F (36.9 ?C)   ?TempSrc: Oral  Oral   ?SpO2: 98%  97% 99%  ?Weight:      ?Height:      ? ?Physical Examination: ? General appearance - in mild to moderate distress ?Abdomen - abdomen is benign except under right ribs very tender, would say +Muyrphy but has no gallbladder ?Fundal Height:  size equals dates ?Pelvic Exam:  examination not indicated ?Cervical Exam: Not evaluated.Extremities: extremities normal, atraumatic, no cyanosis or edema with DTRs 2+ bilaterally ?Membranes:intact ? ?Fetal Monitoring:  150s    ? ?Labs:  ?Results for orders placed or performed during the hospital encounter of 07/02/21 (from the past 24 hour(s))  ?Comprehensive metabolic panel  ? Collection Time: 07/08/21  4:12 AM  ?Result Value Ref Range  ? Sodium 137 135 - 145 mmol/L  ? Potassium 3.1 (L) 3.5 - 5.1 mmol/L  ?  Chloride 104 98 - 111 mmol/L  ? CO2 21 (L) 22 - 32 mmol/L  ? Glucose, Bld 133 (H) 70 - 99 mg/dL  ? BUN 10 6 - 20 mg/dL  ? Creatinine, Ser 0.89 0.44 - 1.00 mg/dL  ? Calcium 8.6 (L) 8.9 - 10.3 mg/dL  ? Total Protein 5.5 (L) 6.5 - 8.1 g/dL  ? Albumin 3.0 (L) 3.5 - 5.0 g/dL  ? AST 28 15 - 41 U/L  ? ALT 20 0 - 44 U/L  ? Alkaline Phosphatase 41 38 - 126 U/L  ? Total Bilirubin 0.3 0.3 - 1.2 mg/dL  ? GFR, Estimated >60 >60 mL/min  ? Anion gap 12 5 - 15  ?CBC  ? Collection Time: 07/08/21  4:12 AM  ?Result Value Ref Range  ? WBC 8.9 4.0 - 10.5 K/uL  ? RBC 3.21 (L) 3.87 - 5.11 MIL/uL  ? Hemoglobin 10.6 (L) 12.0 - 15.0 g/dL  ? HCT 30.8 (L) 36.0 - 46.0 %  ? MCV 96.0 80.0 - 100.0 fL  ? MCH 33.0 26.0 - 34.0 pg  ? MCHC 34.4 30.0 - 36.0 g/dL  ? RDW 12.0 11.5 - 15.5 %  ? Platelets 181 150 - 400 K/uL  ? nRBC 0.2 0.0 - 0.2 %  ? ? ?Imaging Studies:    ?  ? ?Medications:  Scheduled ? amLODipine  10 mg Oral Daily  ? aspirin EC  162 mg Oral  Daily  ? clonazePAM  1 mg Oral BID  ? dicyclomine  20 mg Intramuscular Once  ? dicyclomine  20 mg Oral TID AC  ? feeding supplement  1 Container Oral TID BM  ? hydrALAZINE  100 mg Oral TID  ? labetalol  300 mg Oral TID  ? levothyroxine  125 mcg Oral QAC breakfast  ? loratadine  10 mg Oral Daily  ? magnesium oxide  400 mg Oral BID  ? methylPREDNISolone (SOLU-MEDROL) injection  125 mg Intravenous Q12H  ? ondansetron  8 mg Oral Q8H  ? pantoprazole (PROTONIX) IV  40 mg Intravenous Daily  ? potassium chloride  40 mEq Oral BID  ? prenatal multivitamin  1 tablet Oral Q1200  ? venlafaxine XR  225 mg Oral Daily  ? ?I have reviewed the patient's current medications. ? ?ASSESSMENT: ?[id number] 25w2dEstimated Date of Delivery: 10/26/21  ?Worsening RUP/epigastic pain ? ? ? ?PLAN: ?Refractory/Labile CHTN ?- Current medications: Amlodipine 10, Hydral 100 Q8 and Labetalol 300Q8.  ?- At this time, does not appear c/w PreE - P/C ratio 0.11, and other PreE labs wnl. 24 hr urine was 170 (normal) ?- Appreciate cardiology input  and agree that when pt is able to tolerate PO and keep medications down her blood pressure is well controlled.  ?  ?3. Persistent HA ?- Appreciate neuro input with options ?- Reviewed at length with her today risk of neonatal withdrawal and the importance of avoiding dilaudid for HA (among other reasons as discussed with multiple providers including myself). We discussed that this would not be great for the baby in addition to the multiple other risk factors: fetal growth restriction, ToF, likely prematurity. Pt voiced understanding. Encouraged use of tylenol and flexeril prior to use of anything else. ?- Encouraged proceeding with nerve block if HA persistent today.  ?  ?2.  N/V, refractory now with RUQ pain ?-will trial bentyl IM then po, GI consulted for any input, amylase lipase ordered LFTs normal ?- high dose methylprednisolone x 48 hours then titrate with oral prednisone, will hold oral prednisone for now ?- Weight today is 105kg up from 103.7. Will continue daily weights.  ?  ?3.  FWB ?- NST daily ?- BMZ 5/12-13 ?- ToF by fetal echo - NICU aware and s/p consult ?- FGR with elevated dopplers also noted. 5/31 is next planned imaging for growth and dopplers.  ?  ?5. Anxiety/Depression ?- Greatly appreciate psychiatry team input ?- Plan to continue Venlafaxine, klonipin and atarax.  ?- Can see tomorrow about possible resources for inpatient therapy ?  ?6. Routine OB ?- She desires permanent sterilization.  ?- Discussed surgery of salpingectomy vs tubal ligation. She would like to do a salpingectomy.  ?- Discussed risks: bleeding, infection, injury to surrounding organs/tissues.  ?- Insurance is not medicaid - does not need tubal papers. Will confirm with JJordan just in case.  ? ?LFlorian Buff?07/08/2021,1:05 PM ? ? ? ? ? ?

## 2021-07-08 NOTE — Progress Notes (Signed)
Chaplain received consult for spiritual care. Chaplain provided reflective listening as Krystal [name] shared the myriad of ways this pregnancy has been difficult. Krystal [name] was particularly overwhelmed by her abdominal pain during our visit. Chaplain provided empathetic listening and education regarding the thought feeling cycle. Krystal [name] is able to identify ways her anxiety about her pain is intensifying her pain. Chaplain provided some education on therapeutic activities to remain present with her physical and emotional feelings without judgement or interpretation. Chaplain engaged Krystal [name] in a body scan activity. Pt verbalized feeling supported. Chaplain also provided some distraction activities. ? ?Will continue to follow. ? ?Please page as further needs arise. ? ?Donald Prose. Elyn Peers, M.Div. [facility] ?Chaplain ?Pager [phone] ?Office [phone] ? ?

## 2021-07-08 NOTE — Progress Notes (Signed)
? ?Progress Note ? ?Patient Name: Krystal [name] ?Date of Encounter: 07/09/2021 ? ?[facility] Cardiologist: Freada Bergeron, MD  ? ?Subjective  ? ?Feeling slightly better this morning. Suffering from nightmares and was unable to sleep last night. Asking to speak with psych to help with her anxiety/depression. ? ?Blood pressure better this morning 130-140s/70-80s ?Wt 228>237lsb (? Accuracy) ?Continued to have emesis yesterday ? ?Inpatient Medications  ?  ?Scheduled Meds: ? amLODipine  10 mg Oral Daily  ? aspirin EC  162 mg Oral Daily  ? clonazePAM  1 mg Oral BID  ? dicyclomine  20 mg Oral TID AC  ? feeding supplement  1 Container Oral TID BM  ? hydrALAZINE  100 mg Oral TID  ? labetalol  300 mg Oral TID  ? levothyroxine  125 mcg Oral QAC breakfast  ? loratadine  10 mg Oral Daily  ? magnesium oxide  400 mg Oral BID  ? methylPREDNISolone (SOLU-MEDROL) injection  125 mg Intravenous Q12H  ? ondansetron  8 mg Oral Q8H  ? pantoprazole (PROTONIX) IV  40 mg Intravenous Daily  ? prenatal multivitamin  1 tablet Oral Q1200  ? venlafaxine XR  225 mg Oral Daily  ? ?Continuous Infusions: ? famotidine (PEPCID) IV 20 mg (07/08/21 2333)  ? lactated ringers 150 mL/hr at 07/09/21 0434  ? ondansetron [facility]) IV 8 mg (07/09/21 0530)  ? promethazine (PHENERGAN) injection (IM or IVPB) 25 mg (07/09/21 1025)  ? ?PRN Meds: ?acetaminophen, albuterol, calcium carbonate, cyclobenzaprine, diphenhydrAMINE, labetalol **AND** labetalol **AND** labetalol **AND** hydrALAZINE **AND** Measure blood pressure, hydrOXYzine, polyethylene glycol, promethazine (PHENERGAN) injection (IM or IVPB), trimethobenzamide, zolpidem  ? ?Vital Signs  ?  ?Vitals:  ? 07/08/21 1949 07/08/21 2335 07/09/21 0439 07/09/21 0703  ?BP: (!) 141/80 (!) 144/77 135/77   ?Pulse: 84 81 76   ?Resp: '18 17 18   '$ ?Temp: 98.2 ?F (36.8 ?C) 98.5 ?F (36.9 ?C) 97.6 ?F (36.4 ?C)   ?TempSrc: Oral Oral Oral   ?SpO2: 100% 98%    ?Weight:    107.7 kg  ?Height:      ? ? ?Intake/Output  Summary (Last 24 hours) at 07/09/2021 0735 ?Last data filed at 07/08/2021 1828 ?Gross per 24 hour  ?Intake 160 ml  ?Output 420 ml  ?Net -260 ml  ? ? ?  07/09/2021  ?  7:03 AM 07/07/2021  ?  6:37 AM 07/06/2021  ?  4:40 AM  ?Last 3 Weights  ?Weight (lbs) 237 lb 6.4 oz 232 lb 228 lb 9.6 oz  ?Weight (kg) 107.684 kg 105.235 kg 103.692 kg  ?   ? ?Telemetry  ?  ?N/A; not on tele - Personally Reviewed ? ?ECG  ?  ?No new tracing - Personally Reviewed ? ?Physical Exam  ? ?GEN: Sitting up in bed ?Neck: No JVD ?Cardiac: RRR, no murmurs, rubs, or gallops.  ?Respiratory: Clear to auscultation bilaterally. ?GI: Gravid, soft ?MS: No edema; No deformity. ?Neuro:  Nonfocal  ?Psych: Normal affect  ? ?Labs  ?  ?High Sensitivity Troponin:   ?Recent Labs  ?Lab 06/27/21 ?1637  ?TROPONINIHS 12  ?   ?Chemistry ?Recent Labs  ?Lab 07/02/21 ?1507 07/03/21 ?1228 07/05/21 ?1043 07/06/21 ?8527 07/08/21 ?7824  ?NA 137   < > 138 134* 137  ?K 3.3*   < > 3.5 4.0 3.1*  ?CL 106   < > 108 107 104  ?CO2 24   < > 21* 21* 21*  ?GLUCOSE 79   < > 158* 144* 133*  ?BUN  13   < > '6 8 10  '$ ?CREATININE 0.68   < > 0.75 0.74 0.89  ?CALCIUM 8.9   < > 8.8* 8.8* 8.6*  ?PROT 6.0*  --   --  5.5* 5.5*  ?ALBUMIN 3.3*  --   --  3.0* 3.0*  ?AST 20  --   --  22 28  ?ALT 18  --   --  16 20  ?ALKPHOS 51  --   --  43 41  ?BILITOT 0.8  --   --  0.6 0.3  ?GFRNONAA >60   < > >60 >60 >60  ?ANIONGAP 7   < > '9 6 12  '$ ? < > = values in this interval not displayed.  ?  ?Lipids No results for input(s): CHOL, TRIG, HDL, LABVLDL, LDLCALC, CHOLHDL in the last 168 hours.  ?Hematology ?Recent Labs  ?Lab 07/02/21 ?1507 07/06/21 ?3716 07/08/21 ?9678  ?WBC 8.7 9.4 8.9  ?RBC 3.64* 3.22* 3.21*  ?HGB 12.0 10.7* 10.6*  ?HCT 33.6* 30.5* 30.8*  ?MCV 92.3 94.7 96.0  ?MCH 33.0 33.2 33.0  ?MCHC 35.7 35.1 34.4  ?RDW 11.9 12.2 12.0  ?PLT 202 224 181  ? ?Thyroid  ?Recent Labs  ?Lab 07/03/21 ?1228  ?TSH 2.154  ?FREET4 0.89  ?  ?BNP ?No results for input(s): BNP, PROBNP in the last 168 hours. ?  ?DDimer No results  for input(s): DDIMER in the last 168 hours.  ? ?Radiology  ?  ?No results found. ? ?Cardiac Studies  ? ?TTE 06/17/21 ? 1. Global longitudinal strain is -20%. Left ventricular ejection  ?fraction, by estimation, is 60 to 65%. The left ventricle has normal  ?function. The left ventricle has no regional wall motion abnormalities.  ?There is mild left ventricular hypertrophy.  ?Left ventricular diastolic parameters were normal.  ? 2. Right ventricular systolic function is normal. The right ventricular  ?size is normal.  ? 3. Mild mitral valve regurgitation.  ? 4. The aortic valve is tricuspid. Aortic valve regurgitation is not  ?visualized. Aortic valve sclerosis is present, with no evidence of aortic  ?valve stenosis.  ? 5. The inferior vena cava is normal in size with greater than 50%  ?respiratory variability, suggesting right atrial pressure of 3 mmHg.  ?  ? ?Patient Profile  ?   ?31 y.o. 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 P/C 0.11 without pre-elampsia currently. Cardiology is consulted for further management of severe HTN. ? ?Assessment & Plan  ?  ?#Chronic HTN: ?#Hypertension in Pregnancy: ?#History of Pre-Eclampsia and HELLP Syndrome: ?Patient with long-standing history of chronic hypertension with history of HELLP syndrome and pre-eclampsia in prior pregnancies. Has had continued hypertension complicating this pregnancy with no current pre-eclampsia or HELLP although she is incredibly high risk. Her blood pressure responds well to medications if she is able to keep them down, however, the patient continues to have nausea/vomiting despite aggressive medical treatment. Currently blood pressure  is fairly well controlled. Will continue current medication regimen.  ?-Management of nausea/vomiting per primary ?-Okay to switch to purely IV BP medication control if needed if this will improve the nausea/vomiting ?-Unfortunately, we do not have any other great options in terms of blood pressure medications to control her HTN and she  had previously done okay with them without abdominal pain, although they certainly can constipate her ?-Continue labetalol '300mg'$  TID  ?-Continue amlodipine '10mg'$  daily ?-Continue hydralazine '100mg'$  TID ?-Continue IV labetalol/hydralazine pushes as needed ?-Did not tolerate nifedipine due to "jitteriness" ?-Not a good candidate for HCTZ due to risk of volume depletion and hypoK with nausea and vomiting ?-Methyldopa not available in [place] ?-Very high risk for developing pre-eclampsia/HELLP again ?-May ultimately require prolonged hospitalization for nausea/vomiting and BP control ? ?#Persistent Nausea/Vomiting: ?Has persisted despite IV steroids, zofran and phenergan.  ?-Management per OB ?-GI also consulted ? ?#Hypokalemia: ?Secondary to persistent nausea/vomiting.  ?-Repeat BMET tomorrow for monitoring ? ?#Headache: ?CT/MRI/MRV head without evidence of stroke. Has been resistant to multiple medical therapies including steroids, flexiril, benadryl, tylenol. Underwent occipital nerve blocks yesterday. Neuro is following. ?-Management per OBGYN and Neurology ? ?#RUQ Pain: ?RUQ ultrasound unremarkable. Patient has had prior cholecystectomy. LFTs and bili remain normal. ? If constipation contributing as has not had BM since last Wednesday.  ?-Management per OBGYN ?-Started miralax  ? ? ?   ?For questions or updates, please contact [facility] ?Please consult [email or web address] for contact info under  ? ?  ?   ?Signed, ?Freada Bergeron, MD  ?07/09/2021, 7:35 AM    ?

## 2021-07-09 DIAGNOSIS — O09299 Supervision of pregnancy with other poor reproductive or obstetric history, unspecified trimester: Secondary | ICD-10-CM | POA: Diagnosis not present

## 2021-07-09 DIAGNOSIS — O10912 Unspecified pre-existing hypertension complicating pregnancy, second trimester: Secondary | ICD-10-CM | POA: Diagnosis not present

## 2021-07-09 DIAGNOSIS — F419 Anxiety disorder, unspecified: Secondary | ICD-10-CM | POA: Diagnosis not present

## 2021-07-09 DIAGNOSIS — F332 Major depressive disorder, recurrent severe without psychotic features: Secondary | ICD-10-CM

## 2021-07-09 DIAGNOSIS — F4310 Post-traumatic stress disorder, unspecified: Secondary | ICD-10-CM

## 2021-07-09 DIAGNOSIS — F988 Other specified behavioral and emotional disorders with onset usually occurring in childhood and adolescence: Secondary | ICD-10-CM

## 2021-07-09 DIAGNOSIS — R112 Nausea with vomiting, unspecified: Secondary | ICD-10-CM | POA: Diagnosis not present

## 2021-07-09 MED ORDER — PREDNISONE 20 MG PO TABS
50.0000 mg | ORAL_TABLET | Freq: Every day | ORAL | Status: DC
  Administered 2021-07-10 – 2021-07-14 (×5): 50 mg via ORAL
  Filled 2021-07-09 (×5): qty 3

## 2021-07-09 MED ORDER — BUSPIRONE HCL 10 MG PO TABS
10.0000 mg | ORAL_TABLET | Freq: Three times a day (TID) | ORAL | Status: DC
  Administered 2021-07-10 – 2021-07-16 (×19): 10 mg via ORAL
  Filled 2021-07-09 (×22): qty 1

## 2021-07-09 MED ORDER — POLYETHYLENE GLYCOL 3350 17 G PO PACK
17.0000 g | PACK | Freq: Two times a day (BID) | ORAL | Status: DC
  Administered 2021-07-09 – 2021-07-16 (×13): 17 g via ORAL
  Filled 2021-07-09 (×14): qty 1

## 2021-07-09 MED ORDER — HYDRALAZINE HCL 50 MG PO TABS
100.0000 mg | ORAL_TABLET | Freq: Three times a day (TID) | ORAL | Status: DC
  Administered 2021-07-09 – 2021-07-16 (×21): 100 mg via ORAL
  Filled 2021-07-09 (×22): qty 2

## 2021-07-09 MED ORDER — CLONAZEPAM 0.5 MG PO TBDP
1.0000 mg | ORAL_TABLET | Freq: Three times a day (TID) | ORAL | Status: DC
  Administered 2021-07-09 – 2021-07-16 (×18): 1 mg via ORAL
  Filled 2021-07-09 (×20): qty 2

## 2021-07-09 MED ORDER — METHYLPREDNISOLONE SODIUM SUCC 125 MG IJ SOLR
INTRAMUSCULAR | Status: AC
  Filled 2021-07-09: qty 2

## 2021-07-09 MED ORDER — BISACODYL 10 MG RE SUPP
10.0000 mg | Freq: Once | RECTAL | Status: AC
  Administered 2021-07-09: 10 mg via RECTAL
  Filled 2021-07-09: qty 1

## 2021-07-09 MED ORDER — LABETALOL HCL 200 MG PO TABS
300.0000 mg | ORAL_TABLET | Freq: Three times a day (TID) | ORAL | Status: DC
  Administered 2021-07-09 – 2021-07-10 (×4): 300 mg via ORAL
  Filled 2021-07-09 (×4): qty 1

## 2021-07-09 NOTE — Progress Notes (Addendum)
Pt's nurse paged to notify that pt would like a follow up visit. Krystal [name] is feeling a little better physically today, but shared that she is feeling overwhelmed by news from the NICU team that her daughter's condition may not be survivable.  Provided emotional support and practiced coping skills with pt. Krystal [name] shared that the family is considering naming the baby Krystal [name] and delighted in the news that her 31 year old daughter Krystal [name] is the one who came up with that name. Chaplain walked pt to the fountain outside and provided reflective listening as she shared about many of her stressors.  Chaplain facilitated as pt practiced reframing techniques.   Please page as further needs arise.  Donald Prose. Elyn Peers, M.Div. [facility] Chaplain Pager [phone] Office [phone]

## 2021-07-09 NOTE — Progress Notes (Signed)
CSW acknowledged consult and completed chart review. CSW followed up with patient's RN, RN provided collateral information. RN agreed to check in with patient to see if patient is available to meet with CSW. RN reported that patient is currently on the phone and may be available in a few hours to meet with CSW. CSW agreed to follow up with patient tomorrow as patient has met with multiple people today, RN agreed to update patient.  ? ?Abundio Miu, LCSW ?Clinical Social Worker ?[facility] ?Cell#: [phone] ? ?

## 2021-07-09 NOTE — Consult Note (Signed)
[facility] Consultation Note ? ?Referring Provider: No ref. provider found ?Primary Care Physician:  Audley Hose, MD ? ?Reason for Consultation:  abdominal pain ? ?HPI: Krystal [name] is a 31 y.o. female with nausea, vomiting, RUQ pain.  Ongoing intermittent problems with abdominal pain in past, for which she had endoscopy and cholecystectomy with unfortunate recurrence of pain over the past few weeks.  No hematemesis or blood in stool.  No bowel movement for past 6 days.  Nausea/vomiting started when she became pregnant.  Pain is constant, burning/stabbing/cramping, not related to eating. ? ? ?Past Medical History:  ?Diagnosis Date  ? Acute acalculous cholecystitis s/p lap cholecystectomy 05/11/2020 05/11/2020  ? Angio-edema 01/15/2021  ? Anxiety   ? Asthma   ? 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 IC (interstitial cystitis)   ? Kidney stones   ? Migraines   ? Necrotizing fasciitis ([place])   ? left hand amputation  ? Severe uncontrolled hypertension 10/08/2017  ? UTI (urinary tract infection)   ? ? ?Past Surgical History:  ?Procedure Laterality Date  ? APPENDECTOMY    ? BIOPSY  05/10/2020  ? Procedure: BIOPSY;  Surgeon: Arta Silence, MD;  Location: [facility];  Service: Endoscopy;;  ? CESAREAN SECTION N/A 12/06/2017  ? Procedure: CESAREAN SECTION;  Surgeon: Sloan Leiter, MD;  Location: [place];  Service: Obstetrics;  Laterality: N/A;  ? CHOLECYSTECTOMY N/A 05/11/2020  ? Procedure: SINGLE SITE LAPAROSCOPIC CHOLECYSTECTOMY AND LIVER BIOSY;  Surgeon: Michael Boston, MD;  Location: [facility];  Service: General;  Laterality: N/A;  ? ESOPHAGOGASTRODUODENOSCOPY (EGD) WITH PROPOFOL N/A 05/10/2020  ? Procedure: ESOPHAGOGASTRODUODENOSCOPY (EGD) WITH PROPOFOL;  Surgeon: Arta Silence, MD;  Location: [facility];  Service: Endoscopy;  Laterality: N/A;  ? 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 HAND RECONSTRUCTION Right   ?  INCISION AND DRAINAGE    ? NASAL SEPTUM SURGERY    ? ? ?Prior to Admission medications   ?Medication Sig Start Date End Date Taking? Authorizing Provider  ?acetaminophen (TYLENOL) 500 MG tablet Take 2 tablets (1,000 mg total) by mouth every 6 (six) hours as needed. 05/14/20   Bonnielee Haff, MD  ?albuterol (PROVENTIL HFA;VENTOLIN HFA) 108 (90 Base) MCG/ACT inhaler Inhale 2 puffs into the lungs every 6 (six) hours as needed for wheezing or shortness of breath.    [provider]  ?amLODipine (NORVASC) 10 MG tablet Take 1 tablet (10 mg total) by mouth daily. 05/31/21   Freada Bergeron, MD  ?aspirin EC 81 MG tablet Take 1 tablet (81 mg total) by mouth daily. Take after 12 weeks for prevention of preeclampsia later in pregnancy 04/01/21   Chancy Milroy, MD  ?budesonide-formoterol [facility]) 80-4.5 MCG/ACT inhaler Inhale 2 puffs into the lungs in the morning and at bedtime. Take for 1-2 weeks at a time during asthma flare. Rinse mouth after each use. 03/26/21   Garnet Sierras, DO  ?cetirizine (ZYRTEC ALLERGY) 10 MG tablet Take 1 tablet (10 mg total) by mouth 2 (two) times daily. 03/26/21   Garnet Sierras, DO  ?cholecalciferol (VITAMIN D3) 25 MCG (1000 UNIT) tablet Take 1,000 Units by mouth daily.    [provider]  ?EPINEPHrine 0.3 mg/0.3 mL IJ SOAJ injection Inject 0.3 mg into the muscle as needed for anaphylaxis. 01/15/21   Garnet Sierras, DO  ?famotidine (PEPCID) 20 MG tablet Take 1 tablet (20 mg total) by mouth 2 (two) times daily. 03/26/21  Garnet Sierras, DO  ?hydrALAZINE (APRESOLINE) 100 MG tablet Take 1 tablet (100 mg total) by mouth 3 (three) times daily. 05/14/20   Bonnielee Haff, MD  ?hydrOXYzine (VISTARIL) 25 MG capsule Take 1-2 capsules (25-50 mg total) by mouth 3 (three) times daily as needed for itching. 05/08/21   Rasch, Anderson Malta I, NP  ?labetalol (NORMODYNE) 300 MG tablet Take 0.5 tablets (150 mg total) by mouth 3 (three) times daily. 05/31/21   Freada Bergeron, MD  ?levothyroxine (SYNTHROID)  125 MCG tablet Take 1 tablet (125 mcg total) by mouth daily before breakfast. 05/24/21   Philemon Kingdom, MD  ?ondansetron (ZOFRAN-ODT) 8 MG disintegrating tablet Take 1 tablet (8 mg total) by mouth every 8 (eight) hours as needed for nausea or vomiting. 05/08/21   Rasch, Anderson Malta I, NP  ?pantoprazole (PROTONIX) 40 MG tablet Take 1 tablet (40 mg total) by mouth daily. 06/13/21   Woodroe Mode, MD  ?potassium chloride SA (KLOR-CON M) 20 MEQ tablet Take 1 tablet (20 mEq total) by mouth daily for 7 days. 05/08/21 08/07/22  Rasch, Artist Pais, NP  ?Prenatal Multivit-Min-Fe-FA (PRE-NATAL PO) Take by mouth.    [provider]  ?promethazine (PHENERGAN) 25 MG tablet Take 1 tablet (25 mg total) by mouth every 6 (six) hours as needed for nausea or vomiting. 03/31/21   Wende Mott, CNM  ?scopolamine (TRANSDERM-SCOP) 1 MG/3DAYS Place 1 patch (1.5 mg total) onto the skin every 3 (three) days. 05/23/21   Donnamae Jude, MD  ?venlafaxine XR (EFFEXOR-XR) 150 MG 24 hr capsule Take 150 mg by mouth daily. 06/19/20   [provider]  ? ? ?Current Facility-Administered Medications  ?Medication Dose Route Frequency Provider Last Rate Last Admin  ? acetaminophen (TYLENOL) tablet 650 mg  650 mg Oral Q4H PRN Radene Gunning, MD   650 mg at 07/09/21 0241  ? albuterol (VENTOLIN HFA) 108 (90 Base) MCG/ACT inhaler 2 puff  2 puff Inhalation Q6H PRN Radene Gunning, MD      ? amLODipine (NORVASC) tablet 10 mg  10 mg Oral Daily Radene Gunning, MD   10 mg at 07/09/21 1610  ? aspirin EC tablet 162 mg  162 mg Oral Daily Radene Gunning, MD   162 mg at 07/09/21 9604  ? calcium carbonate (TUMS - dosed in mg elemental calcium) chewable tablet 400 mg of elemental calcium  2 tablet Oral Q4H PRN Radene Gunning, MD   400 mg of elemental calcium at 07/08/21 1149  ? clonazePAM (KLONOPIN) disintegrating tablet 1 mg  1 mg Oral BID Radene Gunning, MD   1 mg at 07/09/21 0915  ? cyclobenzaprine (FLEXERIL) tablet 10 mg  10 mg Oral TID PRN Radene Gunning,  MD   10 mg at 07/09/21 0241  ? dicyclomine (BENTYL) tablet 20 mg  20 mg Oral TID AC Florian Buff, MD   20 mg at 07/08/21 1753  ? diphenhydrAMINE (BENADRYL) injection 25 mg  25 mg Intravenous Q6H PRN Radene Gunning, MD   25 mg at 07/09/21 0435  ? famotidine (PEPCID) IVPB 20 mg premix  20 mg Intravenous Q12H Radene Gunning, MD 100 mL/hr at 07/09/21 0933 20 mg at 07/09/21 0933  ? feeding supplement (BOOST / RESOURCE BREEZE) liquid 1 Container  1 Container Oral TID BM Radene Gunning, MD   1 Container at 07/06/21 2110  ? labetalol (NORMODYNE) injection 20 mg  20 mg Intravenous PRN Radene Gunning, MD   20 mg at 07/07/21 1642  ? And  ? labetalol (NORMODYNE)  injection 40 mg  40 mg Intravenous PRN Radene Gunning, MD   40 mg at 07/06/21 1557  ? And  ? labetalol (NORMODYNE) injection 80 mg  80 mg Intravenous PRN Radene Gunning, MD      ? And  ? hydrALAZINE (APRESOLINE) injection 10 mg  10 mg Intravenous PRN Radene Gunning, MD      ? hydrALAZINE (APRESOLINE) tablet 100 mg  100 mg Oral TID Radene Gunning, MD   100 mg at 07/09/21 0960  ? hydrOXYzine (ATARAX) tablet 25-50 mg  25-50 mg Oral TID PRN Radene Gunning, MD   50 mg at 07/09/21 4540  ? labetalol (NORMODYNE) tablet 300 mg  300 mg Oral TID Geralynn Rile, MD   300 mg at 07/09/21 0916  ? lactated ringers infusion   Intravenous Continuous Radene Gunning, MD 150 mL/hr at 07/09/21 0434 New Bag at 07/09/21 0434  ? levothyroxine (SYNTHROID) tablet 125 mcg  125 mcg Oral QAC breakfast Radene Gunning, MD   125 mcg at 07/09/21 9811  ? loratadine (CLARITIN) tablet 10 mg  10 mg Oral Daily Radene Gunning, MD   10 mg at 07/07/21 1041  ? magnesium oxide (MAG-OX) tablet 400 mg  400 mg Oral BID Radene Gunning, MD   400 mg at 07/08/21 2153  ? methylPREDNISolone sodium succinate (SOLU-MEDROL) 125 mg/2 mL injection 125 mg  125 mg Intravenous Q12H Florian Buff, MD   125 mg at 07/09/21 0919  ? ondansetron (ZOFRAN) 8 mg in sodium chloride 0.9 % 50 mL IVPB  8 mg Intravenous Q8H Radene Gunning, MD 216  mL/hr at 07/09/21 0530 8 mg at 07/09/21 0530  ? Or  ? ondansetron (ZOFRAN-ODT) disintegrating tablet 8 mg  8 mg Oral Q8H Radene Gunning, MD   8 mg at 07/08/21 1148  ? pantoprazole (PROTONIX) injection 40 mg

## 2021-07-09 NOTE — Procedures (Signed)
Procedure Date:  07/07/21 ?Procedure: Occipital Nerve Block, bi  ?Pre-procedure Diagnosis: Headache  ?Post-procedure Diagnosis: same as above  ?Prior to Procedure:  ?Informed Consent: The risks, benefits, indications, potential complications, and alternatives were explained to the patient/family and informed consent obtained.  ?Attending Staff:  Dr. Quinn Axe, me and Charlean Merl NP ?Resident/Fellow: none ?Skin Prep: Cleansed with alcohol.  ?Anesthesia: 54m Lidocaine 1% without epinephrine without added sodium bicarbonate, decadron 144m('4mg'$ ) ?Indications: intractable headache. The identity of the patient was confirmed and a bedside time out was performed.  ?Description of Procedure: Pt's skin was prepped with alcohol and lidocaine 1% and decadron '4mg'$ /ml was injected over the occipital ridge in a fan-like fashion with appropriate procedure bilaterally. Pt had immediate relief.  ?Findings: some HA relief  ?Complications: The patient tolerated the procedure well with no complications except crying for injection pain.  ?Specimens:none  ?Estimated blood loss: zero  ? ?JiRosalin HawkingMD PhD ?Stroke Neurology ?07/09/2021 ?3:57 PM ? ?
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
## 2021-07-09 NOTE — Progress Notes (Signed)
Patient ID: Krystal [name], female   DOB: 1990-08-05, 31 y.o.   MRN: [id number] ?FACULTY PRACTICE ANTEPARTUM(COMPREHENSIVE) NOTE ? ?Krystal [name] is a 31 y.o. [id number] with Estimated Date of Delivery: 10/26/21   By  early ultrasound [redacted]w[redacted]d [facility]   ? ?Fetal presentation is cephalic. ?Length of Stay:  7  Days  Date of admission:07/02/2021 ? ?Subjective: ?Better today ?I do not objectively sense her having any pain at present ?Patient reports the fetal movement as active. ?Patient reports uterine contraction  activity as none. ?Patient reports  vaginal bleeding as none. ?Patient describes fluid per vagina as None. ? ?Vitals:  Blood pressure (!) 156/89, pulse 83, temperature 98 ?F (36.7 ?C), temperature source Oral, resp. rate 17, height '5\' 11"'$  (1.803 m), weight 107.7 kg, last menstrual period 01/19/2021, SpO2 100 %, not currently breastfeeding. ?Vitals:  ? 07/09/21 0439 07/09/21 0703 07/09/21 0906 07/09/21 0935  ?BP: 135/77  (!) 160/93 (!) 156/89  ?Pulse: 76  95 83  ?Resp: 18  17   ?Temp: 97.6 ?F (36.4 ?C)  98 ?F (36.7 ?C)   ?TempSrc: Oral  Oral   ?SpO2:   100%   ?Weight:  107.7 kg    ?Height:      ? ?Physical Examination: ? General appearance - alert, well appearing, and in no distress ?Abdomen - soft, nontender, nondistended, no masses or organomegaly ?Fundal Height:  size equals dates ?Pelvic Exam:  examination not indicated ?Cervical Exam: Not evaluated..Marland Kitchen?Extremities: extremities normal, atraumatic, no cyanosis or edema with DTRs 2+ bilaterally ?Membranes:intact ? ?Fetal Monitoring:  140s ? ?Labs:  ?Results for orders placed or performed during the hospital encounter of 07/02/21 (from the past 24 hour(s))  ?Lipase, blood  ? Collection Time: 07/08/21  2:07 PM  ?Result Value Ref Range  ? Lipase 22 11 - 51 U/L  ?Amylase  ? Collection Time: 07/08/21  2:07 PM  ?Result Value Ref Range  ? Amylase 26 (L) 28 - 100 U/L  ? ? ?Imaging Studies:    ?[place] LIMITED RUQ (LIVER/GB) ? ?Result Date:  07/06/2021 ?CLINICAL DATA:  History of prior cholecystectomy presenting with intractable nausea and vomiting. EXAM: ULTRASOUND ABDOMEN LIMITED RIGHT UPPER QUADRANT COMPARISON:  May 08, 2020 FINDINGS: Gallbladder: Gallbladder is surgically absent. Common bile duct: Diameter: 2.1 mm Liver: No focal lesion identified. Within normal limits in parenchymal echogenicity. Portal vein is patent on color Doppler imaging with normal direction of blood flow towards the liver. Other: None. IMPRESSION: 1. Findings consistent with history of prior cholecystectomy. 2. Otherwise, unremarkable right upper quadrant ultrasound. Electronically Signed   By: TVirgina NorfolkM.D.   On: 07/06/2021 02:15    ? ?Medications:  Scheduled ? amLODipine  10 mg Oral Daily  ? aspirin EC  162 mg Oral Daily  ? clonazePAM  1 mg Oral Q8H  ? dicyclomine  20 mg Oral TID AC  ? feeding supplement  1 Container Oral TID BM  ? hydrALAZINE  100 mg Oral Q8H  ? labetalol  300 mg Oral Q8H  ? levothyroxine  125 mcg Oral QAC breakfast  ? loratadine  10 mg Oral Daily  ? magnesium oxide  400 mg Oral BID  ? methylPREDNISolone (SOLU-MEDROL) injection  125 mg Intravenous Q12H  ? ondansetron  8 mg Oral Q8H  ? pantoprazole (PROTONIX) IV  40 mg Intravenous Daily  ? polyethylene glycol  17 g Oral BID  ? [START ON 07/10/2021] predniSONE  50 mg Oral Q breakfast  ?  prenatal multivitamin  1 tablet Oral Q1200  ? venlafaxine XR  225 mg Oral Daily  ? ?I have reviewed the patient's current medications. ? ?ASSESSMENT + Plan: ?[id number] 33w3dEstimated Date of Delivery: 10/26/21  ? ? ?>CHTN: decent recent control on  ?Norvasc 10 daily ?Apresoline 100 mg q8h ?Labetalol 300 mg q8h ?Thanks to DR PJohney Frame cardiology,  care and following ? ?>History of pre eclampsia/HELLP: ?ASA 1711+ BP meds, certainly risks of recurrence is high  ? ?>Tetralogy of Fallot ?[facility]? ?>FGR: 1.5% with 96% Doppler ratio, AFI 6.4 cm ?Daily monitor strip  ?BMZ 5/12, 5/13 ?Next  sonogram 07/24/21 ? ?>GI issues: ?Nausea/vomiting is improving, not resolved, will transition for methyprednisolone IV to oral prednisone 50 mg daily tomorrow ? ?RUQ pain is improved, functional with/vs psychological overlay ?No evidence of RUQ source, labs sonogram normal ?Continue bentyl, increase daily miralax ? ?Thanks to Dr OPaulita Fujitafor his input and assessment ? ?>Anxiety/depression: ?Effexor XR 225 mg ?Increase klonopin '1mg'$  to every 8 hours, vistaril prn, ambien prn ?Guided meditation with chaplain seems to be helpful ?Disturbing dreams/nightmares and experiencing past traumas, discussed at length CB strategies for de escalation, spent 40 minutes with patient ? ?Thanks to Dr MLeverne Humbles psychiatry for her input and help ? ?>Headache: improved, largely muscular contraction/anxiety related ?Thanks to neurology for their help and input ? ?Improved overall today, certainly her physical state, be it GI, abdominal pain, headache is linked closely with her state of mind ?We discussed that openly and fully.  I am hopeful she will stabilize, understanding it will be daily challenges and good days bad days for all ? ?[place]?07/09/2021,12:03 PM ? ? ? ? ? ?

## 2021-07-09 NOTE — Progress Notes (Signed)
? ?Progress Note ? ?Patient Name: Krystal [name] ?Date of Encounter: 07/10/2021 ? ?[facility] Cardiologist: Freada Bergeron, MD  ? ?Subjective  ? ?Continues to be very upset this morning. Overwhelmed by everything and states she "just wants some rest." Very tearful on exam and is worried about going home.  ? ?SBP 150s today ?K improved to 3.8 ? ?Inpatient Medications  ?  ?Scheduled Meds: ? amLODipine  10 mg Oral Daily  ? aspirin EC  162 mg Oral Daily  ? busPIRone  10 mg Oral TID  ? clonazePAM  1 mg Oral Q8H  ? dicyclomine  20 mg Oral TID AC  ? feeding supplement  1 Container Oral TID BM  ? hydrALAZINE  100 mg Oral Q8H  ? labetalol  300 mg Oral Q8H  ? levothyroxine  125 mcg Oral QAC breakfast  ? loratadine  10 mg Oral Daily  ? magnesium oxide  400 mg Oral BID  ? ondansetron  8 mg Oral Q8H  ? pantoprazole (PROTONIX) IV  40 mg Intravenous Daily  ? polyethylene glycol  17 g Oral BID  ? predniSONE  50 mg Oral Q breakfast  ? prenatal multivitamin  1 tablet Oral Q1200  ? venlafaxine XR  225 mg Oral Daily  ? ?Continuous Infusions: ? famotidine (PEPCID) IV 20 mg (07/09/21 2147)  ? lactated ringers 150 mL/hr at 07/10/21 0452  ? ondansetron [facility]) IV 8 mg (07/10/21 0503)  ? promethazine (PHENERGAN) injection (IM or IVPB) 25 mg (07/09/21 1939)  ? ?PRN Meds: ?acetaminophen, albuterol, calcium carbonate, cyclobenzaprine, diphenhydrAMINE, labetalol **AND** labetalol **AND** labetalol **AND** hydrALAZINE **AND** Measure blood pressure, hydrOXYzine, promethazine (PHENERGAN) injection (IM or IVPB), trimethobenzamide, zolpidem  ? ?Vital Signs  ?  ?Vitals:  ? 07/09/21 2328 07/09/21 2345 07/10/21 0458 07/10/21 0500  ?BP: (!) 170/104 (!) 153/94 (!) 153/90   ?Pulse: 95 92 87   ?Resp: (!) '21 19 19   '$ ?Temp: 98.1 ?F (36.7 ?C)  97.6 ?F (36.4 ?C)   ?TempSrc: Oral  Oral   ?SpO2: 100%  100%   ?Weight:    110.7 kg  ?Height:      ? ? ?Intake/Output Summary (Last 24 hours) at 07/10/2021 0618 ?Last data filed at 07/10/2021 0000 ?Gross  per 24 hour  ?Intake 8041.82 ml  ?Output 250 ml  ?Net 7791.82 ml  ? ? ?  07/10/2021  ?  5:00 AM 07/09/2021  ?  7:03 AM 07/07/2021  ?  6:37 AM  ?Last 3 Weights  ?Weight (lbs) 244 lb 237 lb 6.4 oz 232 lb  ?Weight (kg) 110.678 kg 107.684 kg 105.235 kg  ?   ? ?Telemetry  ?  ?N/A; not on tele - Personally Reviewed ? ?ECG  ?  ?No new tracing - Personally Reviewed ? ?Physical Exam  ? ?GEN: Sitting up in bed, tearful on exam ?Neck: No JVD ?Cardiac: Tachycardic, 2/6 flow murur  ?Respiratory: Clear to auscultation bilaterally. ?GI: Gravid, soft ?MS: Trace edema, warm ?Neuro:  Nonfocal  ?Psych: Anxious, tearful ? ?Labs  ?  ?High Sensitivity Troponin:   ?Recent Labs  ?Lab 06/27/21 ?1637  ?TROPONINIHS 12  ?   ?Chemistry ?Recent Labs  ?Lab 07/06/21 ?8250 07/08/21 ?5397 07/10/21 ?0512  ?NA 134* 137 137  ?K 4.0 3.1* 3.8  ?CL 107 104 103  ?CO2 21* 21* 22  ?GLUCOSE 144* 133* 140*  ?BUN '8 10 14  '$ ?CREATININE 0.74 0.89 0.83  ?CALCIUM 8.8* 8.6* 8.8*  ?PROT 5.5* 5.5*  --   ?ALBUMIN 3.0* 3.0*  --   ?  AST 22 28  --   ?ALT 16 20  --   ?ALKPHOS 43 41  --   ?BILITOT 0.6 0.3  --   ?GFRNONAA >60 >60 >60  ?ANIONGAP '6 12 12  '$ ?  ?Lipids No results for input(s): CHOL, TRIG, HDL, LABVLDL, LDLCALC, CHOLHDL in the last 168 hours.  ?Hematology ?Recent Labs  ?Lab 07/06/21 ?9323 07/08/21 ?5573  ?WBC 9.4 8.9  ?RBC 3.22* 3.21*  ?HGB 10.7* 10.6*  ?HCT 30.5* 30.8*  ?MCV 94.7 96.0  ?MCH 33.2 33.0  ?MCHC 35.1 34.4  ?RDW 12.2 12.0  ?PLT 224 181  ? ?Thyroid  ?Recent Labs  ?Lab 07/03/21 ?1228  ?TSH 2.154  ?FREET4 0.89  ?  ?BNP ?No results for input(s): BNP, PROBNP in the last 168 hours. ?  ?DDimer No results for input(s): DDIMER in the last 168 hours.  ? ?Radiology  ?  ?No results found. ? ?Cardiac Studies  ? ?TTE 06/17/21 ? 1. Global longitudinal strain is -20%. Left ventricular ejection  ?fraction, by estimation, is 60 to 65%. The left ventricle has normal  ?function. The left ventricle has no regional wall motion abnormalities.  ?There is mild left ventricular  hypertrophy.  ?Left ventricular diastolic parameters were normal.  ? 2. Right ventricular systolic function is normal. The right ventricular  ?size is normal.  ? 3. Mild mitral valve regurgitation.  ? 4. The aortic valve is tricuspid. Aortic valve regurgitation is not  ?visualized. Aortic valve sclerosis is present, with no evidence of aortic  ?valve stenosis.  ? 5. The inferior vena cava is normal in size with greater than 50%  ?respiratory variability, suggesting right atrial pressure of 3 mmHg.  ?  ? ?Patient Profile  ?   ?31 y.o. female  with a hx of hypothyroidism, chronic resistant HTN diagnosed age 61, HELLP syndrome, pre-eclampsia, anxiety, kidney stones, migraines, L hand amputation due to necrotizing fasciitis who presented with severe hypertension, vision changes, headaches and anisicoria with CT/MRI negative for stroke. 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 P/C 0.11 without pre-elampsia or HELLP currently. Cardiology is consulted for further management of severe HTN. ? ?Assessment & Plan  ?  ?#Chronic HTN: ?#Hypertension in Pregnancy: ?#History of Pre-Eclampsia and HELLP Syndrome: ?Patient with long-standing history of chronic hypertension with history of HELLP syndrome and pre-eclampsia in prior pregnancies. Has had continued hypertension complicating this pregnancy with no current pre-eclampsia or HELLP although she is incredibly high risk. Her blood pressure responds well to medications if she is able to keep them down, however, the patient continues to have nausea/vomiting despite aggressive medical treatment. Currently, responds well to medication if she is able to tolerate. ?-Management of nausea/vomiting per primary ?-Okay to switch to purely IV BP medication control if needed if this will improve the nausea/vomiting ?-Continue labetalol '300mg'$  TID  ?-Continue amlodipine '10mg'$  daily ?-Continue hydralazine '100mg'$  TID ?-Continue IV labetalol/hydralazine pushes as needed ?-Did not  tolerate nifedipine due to "jitteriness" ?-Not a good candidate for HCTZ due to risk of volume depletion and hypoK with nausea and vomiting ?-Methyldopa not available in [place] ?-Very high risk for developing pre-eclampsia/HELLP again ?-May ultimately require prolonged hospitalization for nausea/vomiting and BP control ? ?#Persistent Nausea/Vomiting: ?Has persisted despite IV steroids, zofran and phenergan.  ?-Management per OB ?-GI also consulted ? ?#Hypokalemia: ?Secondary to persistent nausea/vomiting.  ?-Improved ?-Continue to monitor ? ?#Headache: ?CT/MRI/MRV head without evidence of stroke. Has been resistant to multiple medical therapies including steroids, flexiril, benadryl, tylenol. Underwent occipital nerve blocks yesterday.  Neuro is following. ?-Management per OBGYN and Neurology ? ?#RUQ Pain: ?RUQ ultrasound unremarkable. Patient has had prior cholecystectomy. LFTs and bili remain normal. Seen by GI and suspect related to medications as well as constipation.  ?-Management per GI and OBGYN ? ?#Anxiety/Depression: ?Very overwhelmed, anxious and tearful. Has been followed by psych, social work and the Clinical biochemist. Likely this is contributing to symptoms as well.  ? ? ?   ?For questions or updates, please contact [place] ?Please consult [email or web address] for contact info under  ? ?  ?   ?Signed, ?Freada Bergeron, MD  ?07/10/2021, 6:18 AM    ?

## 2021-07-09 NOTE — Consult Note (Signed)
? ?Consultation Service: Neonatology  ? ?Dr. Elonda Husky has asked for consultation on Krystal [name] regarding the care of a premature infant at [redacted]w[redacted]d Thank you for inviting [place] see this patient.  ? ?Reason for consult:  ?Explain the possible complications, the prognosis, and the care of a premature infant at 292and 3/7 weeks. ? ?Chief complaint: 31y.o. female with a singleton female IUP named "Krystal [name] with an estimated weight of 440 grams (last on 5/9). 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  Plan is for delivery via ceasarean section/vaginal delivery if possible. ? ?My key findings of this patient's HPI are:  ?I have reviewed the patient's chart and have met with her. The salient ?information is as follows:  ? ?[facility] severe range blood pressures despite IV medication and abdominal pain. She is not currently in labor however continues to experience severe nausea, headaches, and abdominal pain. 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 In addition she has significant social concerns regarding prior inpatient psychiatric admission for depression/anxiety after a miscarriage and her father was placed on palliative care immediately prior to this admission. Former 217weeker (31years old) is doing remarkably well at home ([facility] and they have her husband's father in town to provide support. ? ?Prenatal labs: ?  ?Prenatal care:   good ?Pregnancy complications:  chronic HTN, fetal anomaly, IUGR ?Maternal antibiotics: This patient's mother is not on file. ?Maternal Steroids: Betamethasone ?Most recent dose:  5/12-5/13  ? ?My recommendations for this patient and my actions included:  ? ?1. In the presence of the JWestonand her husband (Aaron Edelmanvia phone), I spent 30 minutes discussing the possible complications and outcomes of prematurity at  this gestational age. I discussed specific complications at this gestational age referencing the need for resuscitation at birth due to respiratory distress which may require mechanical ventilation, CPAP, and surfactant administration. In addition infant may require IV fluids pending establishment of enteral feeds (encouraged breast milk feeding), antibiotics for possible sepsis, temperature support, and continuous monitoring. I also discussed the potential risk of complications such as intracranial hemorrhage, retinopathy, hearing deficit, and chronic lung disease. They are familiar with these complications however I emphasized that Krystal [name]'s growth restriction and congenital heart defect make things significantly more complicated. 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 We would potentially need to administer prolonged Prostaglandin therapy via central line soon after birth and she may require prolonged intubation. I discussed this with parents in detail and they expressed an understanding of the risks and complications of prematurity and her heart defect. They also [facility] while they await delivery and postnatal echo, determination of pulmonary blood flow, and interval growth/stabilization prior to any surgical correction/intervention (if needed). ? ?2. I also discussed the expected survival of an infant born at 260 weekswith a congenital heart defect, which is poor. We further discussed that many of the neonates born at this age have profound or severe neurological complications and school difficulties. In addition, most of the neonates born at this age will have some for of mild to moderate neurological complications. Parents  expressed an understanding of this information. We  discussed comfort care as an option moving forward and the parents both desire a trial of intensive care but understand that we can always transition to a palliative approach if her postnatal course warrants this.  ? ?3. I informed her that the NICU team would be present at the delivery. She agreed that all appropriate medical measures could be taken to resuscitate her infant at the delivery. She also understood that our team will always be available for any questions that come up during their infant's hospitalization and we will continue to partner with their family to support them through this difficult time. Visitation policy was discussed and all questions were addressed. ? ? ?Final Impression:  ?31 y.o. female with a singleton female IUP named "Krystal [name]" who is threatening to deliver and who now understands the possible complications and prognosis of her infant. The mother agrees with plan for resuscitation and ICU care. Krystal [name]'s questions were answered. She is planning to try and provide breast milk for her infant.  ? ? ?______________________________________________________________________ ? ?Thank you for asking [place] to participate in the care of this patient. Please do ?not hesitate to contact us again if you are aware of any further ways we can be ?of assistance.  ? ?Sincerely,  ?Krystal Circle, MD ?Attending Neonatologist ? ? ?I spent ~40 minutes in consultation time, of which 20 minutes was spent in direct face to face counseling.  ? ?

## 2021-07-10 DIAGNOSIS — O10912 Unspecified pre-existing hypertension complicating pregnancy, second trimester: Secondary | ICD-10-CM | POA: Diagnosis not present

## 2021-07-10 DIAGNOSIS — F419 Anxiety disorder, unspecified: Secondary | ICD-10-CM

## 2021-07-10 DIAGNOSIS — R112 Nausea with vomiting, unspecified: Secondary | ICD-10-CM | POA: Diagnosis not present

## 2021-07-10 DIAGNOSIS — O09299 Supervision of pregnancy with other poor reproductive or obstetric history, unspecified trimester: Secondary | ICD-10-CM | POA: Diagnosis not present

## 2021-07-10 LAB — BASIC METABOLIC PANEL
Anion gap: 12 (ref 5–15)
BUN: 14 mg/dL (ref 6–20)
CO2: 22 mmol/L (ref 22–32)
Calcium: 8.8 mg/dL — ABNORMAL LOW (ref 8.9–10.3)
Chloride: 103 mmol/L (ref 98–111)
Creatinine, Ser: 0.83 mg/dL (ref 0.44–1.00)
GFR, Estimated: >60 mL/min (ref >60)
Glucose, Bld: 140 mg/dL — ABNORMAL HIGH (ref 70–99)
Potassium: 3.8 mmol/L (ref 3.5–5.1)
Sodium: 137 mmol/L (ref 135–145)

## 2021-07-10 MED ORDER — LABETALOL HCL 200 MG PO TABS
400.0000 mg | ORAL_TABLET | Freq: Three times a day (TID) | ORAL | Status: DC
  Administered 2021-07-10 – 2021-07-12 (×6): 400 mg via ORAL
  Filled 2021-07-10 (×7): qty 2

## 2021-07-10 MED ORDER — LORAZEPAM 2 MG/ML IJ SOLN
2.0000 mg | Freq: Three times a day (TID) | INTRAMUSCULAR | Status: DC | PRN
  Administered 2021-07-10 – 2021-07-12 (×4): 2 mg via INTRAVENOUS
  Filled 2021-07-10 (×4): qty 1

## 2021-07-10 MED ORDER — CYCLOBENZAPRINE HCL 10 MG PO TABS
5.0000 mg | ORAL_TABLET | Freq: Once | ORAL | Status: AC
  Administered 2021-07-10: 5 mg via ORAL
  Filled 2021-07-10: qty 1

## 2021-07-10 MED ORDER — DIPHENHYDRAMINE HCL 50 MG/ML IJ SOLN
50.0000 mg | Freq: Four times a day (QID) | INTRAMUSCULAR | Status: DC | PRN
  Administered 2021-07-11 – 2021-07-13 (×9): 50 mg via INTRAVENOUS
  Filled 2021-07-10 (×11): qty 1

## 2021-07-10 MED ORDER — DIPHENHYDRAMINE HCL 50 MG/ML IJ SOLN
25.0000 mg | Freq: Once | INTRAMUSCULAR | Status: AC
  Administered 2021-07-10: 25 mg via INTRAVENOUS
  Filled 2021-07-10: qty 1

## 2021-07-10 NOTE — Progress Notes (Signed)
Chaplain to room at 1825.  Patient states she is ready to speak to chaplain at this time.  D.Teran RN spoke with Husband on Phone discussing Counseling Options .  ? ?

## 2021-07-10 NOTE — Progress Notes (Signed)
Progress Note  Patient Name: Krystal [name] Date of Encounter: 07/11/2021  [facility] Cardiologist: Freada Bergeron, MD   Subjective   SBP elevated to 150-160/80-90s. Received IV hydralazine and labetalol as did not tolerate PO medications.   Wt 244>241  HA slightly improved now.   Inpatient Medications    Scheduled Meds:  amLODipine  10 mg Oral Daily   aspirin EC  162 mg Oral Daily   busPIRone  10 mg Oral TID   clonazePAM  1 mg Oral Q8H   dicyclomine  20 mg Oral TID AC   feeding supplement  1 Container Oral TID BM   hydrALAZINE  100 mg Oral Q8H   labetalol  400 mg Oral Q8H   levothyroxine  125 mcg Oral QAC breakfast   loratadine  10 mg Oral Daily   magnesium oxide  400 mg Oral BID   ondansetron  8 mg Oral Q8H   pantoprazole (PROTONIX) IV  40 mg Intravenous Daily   polyethylene glycol  17 g Oral BID   predniSONE  50 mg Oral Q breakfast   prenatal multivitamin  1 tablet Oral Q1200   venlafaxine XR  225 mg Oral Daily   Continuous Infusions:  famotidine (PEPCID) IV 20 mg (07/11/21 0022)   ondansetron (ZOFRAN) IV 8 mg (07/11/21 3546)   promethazine (PHENERGAN) injection (IM or IVPB) 25 mg (07/11/21 0353)   PRN Meds: acetaminophen, albuterol, calcium carbonate, cyclobenzaprine, diphenhydrAMINE, labetalol **AND** labetalol **AND** labetalol **AND** hydrALAZINE **AND** Measure blood pressure, hydrOXYzine, LORazepam, promethazine (PHENERGAN) injection (IM or IVPB), trimethobenzamide, zolpidem   Vital Signs    Vitals:   07/11/21 0422 07/11/21 0427 07/11/21 0428 07/11/21 0634  BP:  (!) 168/93 (!) 154/93   Pulse:  77 77 78  Resp:  17    Temp:  98.1 F (36.7 C)    TempSrc:  Oral    SpO2:  96%    Weight: 109.5 kg     Height:        Intake/Output Summary (Last 24 hours) at 07/11/2021 0734 Last data filed at 07/11/2021 0631 Gross per 24 hour  Intake 600 ml  Output 550 ml  Net 50 ml      07/11/2021    4:22 AM 07/10/2021    5:00 AM 07/09/2021    7:03 AM   Last 3 Weights  Weight (lbs) 241 lb 6 oz 244 lb 237 lb 6.4 oz  Weight (kg) 109.487 kg 110.678 kg 107.684 kg      Telemetry    N/A; not on tele - Personally Reviewed  ECG    No new tracing - Personally Reviewed  Physical Exam   GEN: Sitting up in bed, tearful on exam Neck: No JVD Cardiac: Tachycardic, 2/6 flow murur  Respiratory: Clear to auscultation bilaterally. GI: Gravid, soft MS: Trace edema, warm Neuro:  Nonfocal  Psych: Anxious, tearful  Labs    High Sensitivity Troponin:   Recent Labs  Lab 06/27/21 1637  TROPONINIHS 12     Chemistry Recent Labs  Lab 07/06/21 0511 07/08/21 0412 07/10/21 0512  NA 134* 137 137  K 4.0 3.1* 3.8  CL 107 104 103  CO2 21* 21* 22  GLUCOSE 144* 133* 140*  BUN '8 10 14  '$ CREATININE 0.74 0.89 0.83  CALCIUM 8.8* 8.6* 8.8*  PROT 5.5* 5.5*  --   ALBUMIN 3.0* 3.0*  --   AST 22 28  --   ALT 16 20  --   ALKPHOS 43 41  --  BILITOT 0.6 0.3  --   GFRNONAA >60 >60 >60  ANIONGAP '6 12 12    '$ Lipids No results for input(s): CHOL, TRIG, HDL, LABVLDL, LDLCALC, CHOLHDL in the last 168 hours.  Hematology Recent Labs  Lab 07/06/21 0511 07/08/21 0412  WBC 9.4 8.9  RBC 3.22* 3.21*  HGB 10.7* 10.6*  HCT 30.5* 30.8*  MCV 94.7 96.0  MCH 33.2 33.0  MCHC 35.1 34.4  RDW 12.2 12.0  PLT 224 181   Thyroid  No results for input(s): TSH, FREET4 in the last 168 hours.   BNP No results for input(s): BNP, PROBNP in the last 168 hours.   DDimer No results for input(s): DDIMER in the last 168 hours.   Radiology    No results found.  Cardiac Studies   TTE 06/17/21  1. Global longitudinal strain is -20%. Left ventricular ejection  fraction, by estimation, is 60 to 65%. The left ventricle has normal  function. The left ventricle has no regional wall motion abnormalities.  There is mild left ventricular hypertrophy.  Left ventricular diastolic parameters were normal.   2. Right ventricular systolic function is normal. The right  ventricular  size is normal.   3. Mild mitral valve regurgitation.   4. The aortic valve is tricuspid. Aortic valve regurgitation is not  visualized. Aortic valve sclerosis is present, with no evidence of aortic  valve stenosis.   5. The inferior vena cava is normal in size with greater than 50%  respiratory variability, suggesting right atrial pressure of 3 mmHg.     Patient Profile     31 y.o. 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 P/C 0.11 without pre-elampsia or HELLP currently. Cardiology is consulted for further management of severe HTN.  Assessment & Plan    #Chronic HTN: #Hypertension in Pregnancy: #History of Pre-Eclampsia and HELLP Syndrome: Patient with long-standing history of chronic hypertension with history of HELLP syndrome and pre-eclampsia in prior pregnancies. Has had continued hypertension complicating this pregnancy with no current pre-eclampsia or HELLP although she is incredibly high risk. Her blood pressure responds well to medications if she is able to keep them down, however, the patient continues to have nausea/vomiting despite aggressive medical treatment. Continuing to require IV pushes of meds this AM due to inability to keep down POs. -Management of nausea/vomiting per primary -Continue labetalol '300mg'$  TID  -Continue amlodipine '10mg'$  daily -Continue hydralazine '100mg'$  TID -Continue IV labetalol/hydralazine pushes as needed -Did not tolerate nifedipine due to "jitteriness" -Not a good candidate for HCTZ due to risk of volume depletion and hypoK with nausea and vomiting -Methyldopa not available in [place] -Very high risk for developing  pre-eclampsia/HELLP again  #Persistent Nausea/Vomiting: Has persisted despite IV steroids, zofran and phenergan.  -Management per OB  #Hypokalemia: Secondary to persistent nausea/vomiting.  -Repeat BMET tomorrow for monitoring  #Headache: CT/MRI/MRV head without evidence of stroke. Has been resistant to multiple medical therapies including steroids, flexiril, benadryl, tylenol. Underwent occipital nerve blocks yesterday. Neuro is following. -Management per OBGYN and Neurology  #RUQ Pain: RUQ ultrasound unremarkable. Patient has had prior cholecystectomy. LFTs and bili remain normal. Seen by GI and suspect related to medications as well as constipation.  -Management per GI and OBGYN  #Anxiety/Depression: Very overwhelmed, anxious  and tearful. Has been followed by psych, social work and the Clinical biochemist. Likely this is contributing to symptoms as well.       For questions or updates, please contact [place] Please consult [email or web address] for contact info under        Signed, Freada Bergeron, MD  07/11/2021, 7:34 AM

## 2021-07-10 NOTE — OR Nursing (Signed)
Patient States "Feeling better now" .  Crying earlier, RN gave emotional support.  Chaplain called to see patient this afternoon.  ? ?

## 2021-07-10 NOTE — Progress Notes (Signed)
CSW met with patient at bedside. Patient was sitting up in bed. CSW introduced self and explained role. MOB was tearful and open when speaking with CSW. CSW and MOB discussed MOB's current feelings. MOB shared about feeling sad and very depressed. CSW and MOB processed stressors that are attributing to patient's feelings and emotions. Patient shared about having a miscarriage prior to this pregnancy that caused significant marital strain and about grieving her dad dying. CSW acknowledged patient having multiple stressors and the significance of each stressor. Patient shared about increased anxiety and anxiety medication not working. Patient reported that she was previously on a high dose of Xanax and is now on Klonopin. Patient was tearful while sharing, CSW acknowledged, validated, and normalized MOB's feelings and emotions. Patient shared that she met with the [place] yesterday which was a little helpful. Patient shared concerns about being discharged while not feeling well.  ? ?Cardiology MD came in to see patient and provided encouraging words, MD left after assessing and speaking with patient. Patient shared about sleep deprivation and the desire to get some rest. Patient reported sleeping for two hours last night after crying herself to sleep. Patient reported that she has shared with medical team that she wants medication to assist with sleep but feels that no one is listening. CSW agreed to check with RN about possibility for patient to receive medication to get some rest. Patient reported that she feels she will be able to process her emotions if she is able to get some sleep. CSW acknowledged how sleep deprivation can intensify symptoms that patient is currently experiencing due to her depression. CSW inquired about MOB's tearfulness, MOB reported that she has been continuously crying. CSW inquired about MOB's support from Husband, MOB shared that she has seen her husband once since being admitted due to  her husband working and caring for daughters at home. CSW And MOB discussed patient's daughters being a protective factor. MOB shared about financial stressors. CSW informed MOB about the [employer] financial assistance program, MOB reported that she was interested. CSW provided MOB with the link to complete the application and informed MOB to contact CSW once application was completed so CSW could complete the healthcare verification form, MOB verbalized understanding. CSW inquired about patient's participation in outpatient therapy, patient reported that she has not had a good history with therapy due to issues connecting with therapists. CSW discussed the importance of therapy to discuss and process patient's stressors and feelings as she has a lot going on at one time. MOB shared about wanting to speak with her mother badly but feels that her mother is toxic and the conversation will not be helpful. CSW acknowledged the emotions behind not being able to speak with family due to the dynamics of the relationship.  ? ?RN came to see patient, CSW inquired about medication options for patient for sleep. RN reported no medication options for sleep. RN agreed to return later.  ? ?CSW (A. Boyd-Gilyard) came to the room to provide additional support for patient. CSW's and patient discussed patient's issues with her mother and how to refocus her energy on things that patient is in control of. Patient reflected on parents conflict and verbalized it was not her responsibility. CSW acknowledged that patient is grieving losing her dad and the loss of the relationship with her mother. CSW's discussed the benefits of therapy and encouraged patient to consider outpatient therapy to focus on past trauma and current stressors, patient agreeable. CSW agreed  to find local agencies that accept patient's insurance. CSW inquired about the last time that patient felt this way, patient reported about one month ago.  CSW inquired about what patient did to treat symptoms during that time, patient reported that she took benadryl and slept for the day which was helpful. CSW inquired about barriers with patient getting rest in the hospital, patient reported that her "brain wont stop spinning" with constant thoughts about her pain, depression, and not knowing what's going on with the baby". CSW acknowledged how difficult it can be to get rest while having racing thoughts. CSW inquired about patient's normal sleep pattern, patient reported that she normally gets 3-4 hours of sleep. Patient shared that she was given a medication via IV while admitted to treat the adverse reaction of another medication administered and it was the best sleep that she has had in a while. Patient reported that she woke up feeling like a new person. CSW agreed to follow up with RN to see if patient is able to get that medication again. Patient reported that she wanted to get some rest in hopes of decreasing her anxiety. CSW inquired about MOB's support system, MOB reported that her husband and father in law are supports. CSW assessed for safety, patient denied SI, HI, and domestic violence. Patient denied any issues with suicidal thoughts. CSW asked what can CSW do to be helpful in this moment. Patient reported no needs other than getting sleep. CSW agreed to look into therapy resources and return with update. CSW provided patient with CSW contact information and encouraged patient to contact CSW if any needs/concerns arise.  ? ?CSWs followed up with RN regarding patient's request for medication to get some rest, RN agreed to look into it.  ? ?Abundio Miu, LCSW ?Clinical Social Worker ?[facility] ?Cell#: [phone] ? ? ? ?

## 2021-07-10 NOTE — Progress Notes (Signed)
Patient crying uncontrollably through most of the night. Patient's Husband called RN out of concern for patient's "head space". RN sat at bedside with patient and spoke with Patient's husband regarding the plan of care. Per Dr.Duncan, plan to continue with current medication regimen and discuss future adjustments with day team/psychiatry. RN attempted to use techniques such as talking through concerns, dimming lights, breathing. Patient unable to focus and try anything. Per patient "I want to jump out of my skin, want it to stop. I am so confused and I don't know what is wrong". RN asked patient if she was having suicidal thoughts, thoughts of hurting herself or others. Patient declined. Anxiety increased after hearing the prognosis from neonatology. RN spoke to husband about having a family member stay with her at night. ?Danie Binder, RN  ? ?

## 2021-07-10 NOTE — Progress Notes (Signed)
Chaplain responded to request to speak with patient.  Nurse reported some of the patient's past history. She has a two and 33 year old at home. Her baby's heart may require surgery.  Patient was standing when staff entered the room, reaching to get a drink from the refrigerator.  She looked very groggy.  Patient climbed in bed and closed her eyes. She then lifted phone and began to talk with her husband on the phone.  Nurse began speaking with husband on phone as well as chaplain tried to speak to the patient  She said her head hurt all the time and she did not have the energy to talk. She said she wanted a psychiatrist to help her.  ?Chaplain departed room and nurse continued to speak with pt's husband.  Chaplain then entered conversation with nurse and offered her support and prayer. ? ?Rev. [place] Misao Fackrell ?Pager [phone] ?

## 2021-07-10 NOTE — Progress Notes (Signed)
Initial Nutrition Assessment ? ?DOCUMENTATION CODES:  ?N/a  ? ?INTERVENTION:  ?Regular diet ?Pt may order double protein portions and snacks TID if she makes request when ordering meals  ?Magic cup on trays [employer] not tol well ) ? ?I am concerned that the pts weight gain is edema and not real weight gain, weight is up 15 lbs in 4 days, pts oral intake would not support this robust of a weight gain ? ? ?NUTRITION DIAGNOSIS:  ?Inadequate oral intake related to nausea, vomiting as evidenced by per patient/family report. ? ?GOAL:  ?Patient will meet greater than or equal to 90% of their needs ? ?MONITOR:  ?PO intake ? ?REASON FOR ASSESSMENT:  ?Antenatal, LOS ?  ? ?ASSESSMENT:  ?31 yo, adm with CHTN and fetal growth restriction. Now 24 4/7 weeks.  wt at 10 weeks 95.3 kg, BMI 29.3. Wt up 15.3 kg to date. pt with n/v secondary to anxiety and depression. During my visit she was quite concerned with prospect of discharge home, and feels more secure for her unborn baby here. She reports that she does well eating baked potatoes, cream of wheat, sherbet, italian ice. Diet/intake since admission has not been optimal. [employer] increases nausea. She has consented to trying magic cup on trays to increase caloric intake ( 290 Kcal, 9 g protein ) ? ? ?Diet Order:   ?Diet Order   ? ?       ?  Diet regular Room service appropriate? Yes; Fluid consistency: Thin  Diet effective now       ?  ? ?  ?  ? ?  ? ? ?EDUCATION NEEDS:  ? ?No education needs have been identified at this time ? ?Skin:  Skin Assessment: Reviewed RN Assessment ? ?Last BM:   5/10 ? ?Height:  ? ?Ht Readings from Last 1 Encounters:  ?07/02/21 '5\' 11"'$  (1.803 m)  ? ? ?Weight:  ? ?Wt Readings from Last 1 Encounters:  ?07/10/21 110.7 kg  ? ? ?Ideal Body Weight:    155 lbs ? ?BMI:  Body mass index is 34.03 kg/m?. ? ?Estimated Nutritional Needs:  ? ?Kcal:  2400-2600 ? ?Protein:  105 -115 g ? ?Fluid:  > 2.4 L,    LR at 150 ml/hr providing 3.6 L/day ? ? ? ? ?

## 2021-07-10 NOTE — Progress Notes (Signed)
Notified dr Elonda Husky of 20 minute monitoring of fetal Heart Tones.  Notified of 160 heart rate with decreased variability , No Decels.  Orders received to remove EFM at this time.  ? ? ?

## 2021-07-10 NOTE — Progress Notes (Signed)
Doran Stabler RN put call into chaplain for emotional support . Paged. ? ?

## 2021-07-10 NOTE — Progress Notes (Signed)
Patient ID: Krystal [name], female   DOB: Dec 05, 1990, 31 y.o.   MRN: [id number] ? ?FACULTY PRACTICE ANTEPARTUM(COMPREHENSIVE) NOTE ? ?Krystal [name] Gains is a 31 y.o. [id number] with Estimated Date of Delivery: 10/26/21   By  early ultrasound [redacted]w[redacted]d who [facility]   ? ?Fetal presentation is cephalic. ?Length of Stay:  8  Days  Date of admission:07/02/2021 ? ?Subjective: ?Stable still struggles with dreams anxiety ?I do not objectively sense her having any pain at present ?Patient reports the fetal movement as active. ?Patient reports uterine contraction  activity as none. ?Patient reports  vaginal bleeding as none. ?Patient describes fluid per vagina as None. ? ?Vitals:  Blood pressure (!) 156/90, pulse 84, temperature 98 ?F (36.7 ?C), temperature source Oral, resp. rate 19, height '5\' 11"'$  (1.803 m), weight 110.7 kg, last menstrual period 01/19/2021, SpO2 100 %, not currently breastfeeding. ?Vitals:  ? 07/10/21 0458 07/10/21 0500 07/10/21 0717 07/10/21 0745  ?BP: (!) 153/90  (!) 156/90   ?Pulse: 87  81 84  ?Resp: 19  19   ?Temp: 97.6 ?F (36.4 ?C)  (!) 97.4 ?F (36.3 ?C) 98 ?F (36.7 ?C)  ?TempSrc: Oral  Oral Oral  ?SpO2: 100%  100%   ?Weight:  110.7 kg    ?Height:      ? ?Physical Examination: ? General appearance - alert, well appearing, and in no distress ?Abdomen - soft, nontender, nondistended, no masses or organomegaly ?Fundal Height:  size equals dates ?Pelvic Exam:  examination not indicated ?Cervical Exam: Not evaluated..Marland Kitchen?Extremities: extremities normal, atraumatic, no cyanosis or edema with DTRs 2+ bilaterally ?Membranes:intact ? ?Fetal Monitoring:  140s ? ?Labs:  ?Results for orders placed or performed during the hospital encounter of 07/02/21 (from the past 24 hour(s))  ?Basic metabolic panel  ? Collection Time: 07/10/21  5:12 AM  ?Result Value Ref Range  ? Sodium 137 135 - 145 mmol/L  ? Potassium 3.8 3.5 - 5.1 mmol/L  ? Chloride 103 98 - 111 mmol/L  ? CO2 22 22 - 32 mmol/L  ? Glucose, Bld 140  (H) 70 - 99 mg/dL  ? BUN 14 6 - 20 mg/dL  ? Creatinine, Ser 0.83 0.44 - 1.00 mg/dL  ? Calcium 8.8 (L) 8.9 - 10.3 mg/dL  ? GFR, Estimated >60 >60 mL/min  ? Anion gap 12 5 - 15  ? ? ?Imaging Studies:    ?No results found.  ? ?Medications:  Scheduled ? amLODipine  10 mg Oral Daily  ? aspirin EC  162 mg Oral Daily  ? busPIRone  10 mg Oral TID  ? clonazePAM  1 mg Oral Q8H  ? dicyclomine  20 mg Oral TID AC  ? feeding supplement  1 Container Oral TID BM  ? hydrALAZINE  100 mg Oral Q8H  ? labetalol  300 mg Oral Q8H  ? levothyroxine  125 mcg Oral QAC breakfast  ? loratadine  10 mg Oral Daily  ? magnesium oxide  400 mg Oral BID  ? ondansetron  8 mg Oral Q8H  ? pantoprazole (PROTONIX) IV  40 mg Intravenous Daily  ? polyethylene glycol  17 g Oral BID  ? predniSONE  50 mg Oral Q breakfast  ? prenatal multivitamin  1 tablet Oral Q1200  ? venlafaxine XR  225 mg Oral Daily  ? ?I have reviewed the patient's current medications. ? ?ASSESSMENT + Plan:No change in care plan ? ? ?[id number] Date of Delivery: 10/26/21  ? ? ?>CHTN: decent recent  control on  ?Norvasc 10 daily ?Apresoline 100 mg q8h ?Labetalol 300 mg q8h ?Thanks to DR Johney Frame, cardiology,  care and following ? ?>History of pre eclampsia/HELLP: ?ASA 585 + BP meds, certainly risks of recurrence is high  ? ?>Tetralogy of Fallot ?Adequate pulmonary vein, may deliver at [facility] ? ?>FGR: 1.5% with 96% Doppler ratio, AFI 6.4 cm ?Daily monitor strip  ?BMZ 5/12, 5/13 ?Next sonogram 07/24/21 ? ?>GI issues: ?Nausea/vomiting is improving, not resolved, will transition for methyprednisolone IV to oral prednisone 50 mg daily  ? ?RUQ pain is improved, functional with/vs psychological overlay ?No evidence of RUQ source, labs sonogram normal ?Continue bentyl, increase daily miralax ? ?Thanks to Dr Paulita Fujita for his input and assessment ? ?>Anxiety/depression: ?Effexor XR 225 mg ?Increase klonopin '1mg'$  to every 8 hours, vistaril prn, ambien prn ?Guided meditation with chaplain seems to  be helpful ?Disturbing dreams/nightmares and experiencing past traumas, discussed at length CB strategies for de escalation, spent 40 minutes with patient ? ?Thanks to Dr Leverne Humbles, psychiatry for her input and help ? ?>Headache: improved, largely muscular contraction/anxiety related ?Thanks to neurology for their help and input ? ?Improved overall today, certainly her physical state, be it GI, abdominal pain, headache is linked closely with her state of mind ?We discussed that openly and fully.  I am hopeful she will stabilize, understanding it will be daily challenges and good days bad days for all ? ?I am hopeful she will ready for discharge tomorrow ?Pt wants to go home ? ?I spent 20 minutes speaking with patient and husband together ? ?[place] ?07/10/2021,10:33 AM ? ? ? ? ? ?

## 2021-07-10 NOTE — Progress Notes (Signed)
CSW returned to room and provided local mental health resources. CSW explained that it was indicated on resource which agencies accepted patient's insurance and earliest available appointments. Patient thanked CSW and denied needing any assistance setting up therapy. CSW encouraged patient to contact CSW if any needs/concerns arise. ? ?Abundio Miu, LCSW ?Clinical Social Worker ?[facility] ?Cell#: [phone] ? ?

## 2021-07-11 DIAGNOSIS — O10912 Unspecified pre-existing hypertension complicating pregnancy, second trimester: Secondary | ICD-10-CM | POA: Diagnosis not present

## 2021-07-11 DIAGNOSIS — O09299 Supervision of pregnancy with other poor reproductive or obstetric history, unspecified trimester: Secondary | ICD-10-CM | POA: Diagnosis not present

## 2021-07-11 DIAGNOSIS — F419 Anxiety disorder, unspecified: Secondary | ICD-10-CM | POA: Diagnosis not present

## 2021-07-11 DIAGNOSIS — R112 Nausea with vomiting, unspecified: Secondary | ICD-10-CM | POA: Diagnosis not present

## 2021-07-11 NOTE — Progress Notes (Signed)
Patient ID: Krystal [name], female   DOB: [birth year], 31 y.o.   MRN: [id number]  [place]) NOTE  Krystal [name] is a 31 y.o. [id number] with Estimated Date of Delivery: 10/26/21   By  early ultrasound [redacted]w[redacted]d [facility]    Fetal presentation is cephalic. Length of Stay:  9  Days  Date of admission:07/02/2021  Subjective: Stable still struggles with dreams anxiety I do not objectively sense her having any pain at present Some nausea and emesis over the past 24 hours  I would say the last night was a step back today a step forward  She is still interested in trying to go home, but I do not think reasonable today  Patient reports the fetal movement as active. Patient reports uterine contraction  activity as none. Patient reports  vaginal bleeding as none. Patient describes fluid per vagina as None.  Vitals:  Blood pressure (!) 148/90, pulse 85, temperature 98 F (36.7 C), temperature source Oral, resp. rate 16, height '5\' 11"'$  (1.803 m), weight 109.5 kg, last menstrual period 01/19/2021, SpO2 98 %, not currently breastfeeding. Vitals:   07/11/21 0801 07/11/21 0821 07/11/21 1154 07/11/21 1608  BP: (!) 164/93 (!) 158/97 (!) 158/95 (!) 148/90  Pulse: 79 80 85   Resp:   16 16  Temp:   98.1 F (36.7 C) 98 F (36.7 C)  TempSrc:   Oral Oral  SpO2:   98% 98%  Weight:      Height:       Physical Examination:  General appearance - alert, well appearing, and in no distress Abdomen - soft, nontender, nondistended, no masses or organomegaly Fundal Height:  size equals dates Pelvic Exam:  examination not indicated Cervical Exam: Not evaluated.. Extremities: extremities normal, atraumatic, no cyanosis or edema with DTRs 2+ bilaterally Membranes:intact  Fetal Monitoring:  140s  Labs:  No results found for this or any previous visit (from the past 24 hour(s)).   Imaging Studies:    No results found.   Medications:  Scheduled   amLODipine  10 mg Oral Daily   aspirin EC  162 mg Oral Daily   busPIRone  10 mg Oral TID   clonazePAM  1 mg Oral Q8H   dicyclomine  20 mg Oral TID AC   feeding supplement  1 Container Oral TID BM   hydrALAZINE  100 mg Oral Q8H   labetalol  400 mg Oral Q8H   levothyroxine  125 mcg Oral QAC breakfast   loratadine  10 mg Oral Daily   magnesium oxide  400 mg Oral BID   ondansetron  8 mg Oral Q8H   pantoprazole (PROTONIX) IV  40 mg Intravenous Daily   polyethylene glycol  17 g Oral BID   predniSONE  50 mg Oral Q breakfast   prenatal multivitamin  1 tablet Oral Q1200   venlafaxine XR  225 mg Oral Daily   I have reviewed the patient's current medications.  ASSESSMENT + Plan:No change in care plan   [id number] Date of Delivery: 10/26/21    >CHTN: decent recent control on  Norvasc 10 daily Apresoline 100 mg q8h Labetalol 300 mg q8h-->400q8h for 24 hours, still not optimal control but don't want to increase 2 consecutive days Thanks to DR PeJohney Framecardiology,  care and following  >History of pre eclampsia/HELLP: ASA 16962 BP meds, certainly risks of recurrence is high   >Tetralogy of Fallot Adequate pulmonary vein, may deliver at  [facility], will continue to discuss with the NICU team as the pregnancy progresses.  In theory tetralogy of Fallot can deliver here but maybe not if extreme prematurity  >FGR: 1.5% with 96% Doppler ratio, AFI 6.4 cm Daily monitor strip  BMZ 5/12, 5/13 Next sonogram 07/24/21  >GI issues: Nausea/vomiting is improving, not resolved, will transition for methyprednisolone IV to oral prednisone 50 mg daily   RUQ pain is improved, functional with/vs psychological overlay No evidence of RUQ source, labs sonogram normal Continue bentyl, increase daily miralax  Thanks to Dr Paulita Fujita for his input and assessment  >Anxiety/depression: Effexor XR 225 mg Increase klonopin '1mg'$  to every 8 hours, vistaril prn, ambien prn Guided meditation with chaplain seems  to be helpful Disturbing dreams/nightmares and experiencing past traumas, discussed at length CB strategies for de escalation, spent 40 minutes with patient  Thanks to Dr Leverne Humbles, psychiatry for her input and help  >Headache: waxes and wanes, largely muscular contraction/anxiety related Thanks to neurology for their help and input  Improved overall today, certainly her physical state, be it GI, abdominal pain, headache is linked closely with her state of mind We discussed that openly and fully.  I am hopeful she will stabilize, understanding it will be daily challenges and good days bad days for all  I am hopeful she will ready for discharge tomorrow Pt states she wants to go home  To some degree we are in a pattern of clinical/diagnostic/therapeutic waxing and waning with various issues on any given day  Overall pretty stabgle, I tend to think we are about as stable as we are going to get  BP will continue to be a direct OB challenge  Sonogram 2 weeks  Florian Buff 07/11/2021,4:51 PM     Patient ID: Krystal [name], female   DOB: [birth year], 31 y.o.   MRN: [id number]

## 2021-07-11 NOTE — Progress Notes (Signed)
Progress Note  Patient Name: Krystal [name] Date of Encounter: 07/12/2021  [facility] Cardiologist: Freada Bergeron, MD   Subjective   Blood pressure elevated to 150s this morning. Received PO meds at 6am but then vomited them up. This occurred about 10-53mn after taking the pills so likely not related to the formulation but rather just pills themselves.   Inpatient Medications    Scheduled Meds:  amLODipine  10 mg Oral Daily   aspirin EC  162 mg Oral Daily   busPIRone  10 mg Oral TID   clonazePAM  1 mg Oral Q8H   dicyclomine  20 mg Oral TID AC   feeding supplement  1 Container Oral TID BM   hydrALAZINE  100 mg Oral Q8H   labetalol  400 mg Oral Q8H   levothyroxine  125 mcg Oral QAC breakfast   loratadine  10 mg Oral Daily   magnesium oxide  400 mg Oral BID   ondansetron  8 mg Oral Q8H   pantoprazole (PROTONIX) IV  40 mg Intravenous Daily   polyethylene glycol  17 g Oral BID   predniSONE  50 mg Oral Q breakfast   prenatal multivitamin  1 tablet Oral Q1200   venlafaxine XR  225 mg Oral Daily   Continuous Infusions:  famotidine (PEPCID) IV 20 mg (07/11/21 2303)   ondansetron (ZOFRAN) IV 8 mg (07/11/21 2341)   promethazine (PHENERGAN) injection (IM or IVPB) 25 mg (07/12/21 0305)   PRN Meds: acetaminophen, albuterol, calcium carbonate, cyclobenzaprine, diphenhydrAMINE, labetalol **AND** labetalol **AND** labetalol **AND** hydrALAZINE **AND** Measure blood pressure, hydrOXYzine, LORazepam, promethazine (PHENERGAN) injection (IM or IVPB), trimethobenzamide, zolpidem   Vital Signs    Vitals:   07/11/21 1154 07/11/21 1608 07/11/21 1944 07/12/21 0425  BP: (!) 158/95 (!) 148/90 (!) 147/96 (!) 158/107  Pulse: 85  88 84  Resp: '16 16 16 16  '$ Temp: 98.1 F (36.7 C) 98 F (36.7 C) 98.1 F (36.7 C) 98.1 F (36.7 C)  TempSrc: Oral Oral Oral Oral  SpO2: 98% 98% 98% 100%  Weight:      Height:        Intake/Output Summary (Last 24 hours) at 07/12/2021 0740 Last  data filed at 07/11/2021 1730 Gross per 24 hour  Intake 100 ml  Output 850 ml  Net -750 ml      07/11/2021    4:22 AM 07/10/2021    5:00 AM 07/09/2021    7:03 AM  Last 3 Weights  Weight (lbs) 241 lb 6 oz 244 lb 237 lb 6.4 oz  Weight (kg) 109.487 kg 110.678 kg 107.684 kg      Telemetry    N/A; not on tele - Personally Reviewed  ECG    No new tracing today - Personally Reviewed  Physical Exam   GEN: Comfortable, sitting up in bed Neck: No JVD Cardiac: Tachycardic, 2/6 flow murmur  Respiratory: Clear to auscultation bilaterally. GI: Gravid, soft MS: Trace edema, warm Neuro:  Nonfocal  Psych: Anxious  Labs    High Sensitivity Troponin:   Recent Labs  Lab 06/27/21 1637  TROPONINIHS 12     Chemistry Recent Labs  Lab 07/06/21 0511 07/08/21 0412 07/10/21 0512  NA 134* 137 137  K 4.0 3.1* 3.8  CL 107 104 103  CO2 21* 21* 22  GLUCOSE 144* 133* 140*  BUN '8 10 14  '$ CREATININE 0.74 0.89 0.83  CALCIUM 8.8* 8.6* 8.8*  PROT 5.5* 5.5*  --   ALBUMIN 3.0* 3.0*  --  AST 22 28  --   ALT 16 20  --   ALKPHOS 43 41  --   BILITOT 0.6 0.3  --   GFRNONAA >60 >60 >60  ANIONGAP '6 12 12    '$ Lipids No results for input(s): CHOL, TRIG, HDL, LABVLDL, LDLCALC, CHOLHDL in the last 168 hours.  Hematology Recent Labs  Lab 07/06/21 0511 07/08/21 0412  WBC 9.4 8.9  RBC 3.22* 3.21*  HGB 10.7* 10.6*  HCT 30.5* 30.8*  MCV 94.7 96.0  MCH 33.2 33.0  MCHC 35.1 34.4  RDW 12.2 12.0  PLT 224 181   Thyroid  No results for input(s): TSH, FREET4 in the last 168 hours.   BNP No results for input(s): BNP, PROBNP in the last 168 hours.   DDimer No results for input(s): DDIMER in the last 168 hours.   Radiology    No results found.  Cardiac Studies   TTE 06/17/21  1. Global longitudinal strain is -20%. Left ventricular ejection  fraction, by estimation, is 60 to 65%. The left ventricle has normal  function. The left ventricle has no regional wall motion abnormalities.  There is  mild left ventricular hypertrophy.  Left ventricular diastolic parameters were normal.   2. Right ventricular systolic function is normal. The right ventricular  size is normal.   3. Mild mitral valve regurgitation.   4. The aortic valve is tricuspid. Aortic valve regurgitation is not  visualized. Aortic valve sclerosis is present, with no evidence of aortic  valve stenosis.   5. The inferior vena cava is normal in size with greater than 50%  respiratory variability, suggesting right atrial pressure of 3 mmHg.     Patient Profile     31 y.o. 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 P/C 0.11 without pre-elampsia or HELLP currently. Cardiology is consulted for further management of severe HTN.  Assessment & Plan    #Chronic HTN: #Hypertension in Pregnancy: #History of Pre-Eclampsia and HELLP Syndrome: Patient with long-standing history of chronic hypertension with history of HELLP syndrome and pre-eclampsia in prior pregnancies. Has had continued hypertension complicating this pregnancy with no current pre-eclampsia or HELLP although she is incredibly high risk. Her blood pressure responds well to medications if she is able to keep them down, however, the patient continues to have nausea/vomiting despite aggressive medical treatment. Overall, this medical regimen is effective for her. Will plan to continue and arrange for close follow-up with [place] in Cardio-OB clinic and PharmD for ongoing BP management. -Management of nausea/vomiting per primary; seems the her symptoms are unrelated to a specific pill but rather just taking PO meds in general -Continue labetalol '400mg'$  TID   -Continue amlodipine '10mg'$  daily -Continue hydralazine '100mg'$  TID -Continue IV labetalol/hydralazine pushes as needed -Did not tolerate nifedipine due to "jitteriness" -Not a good candidate for HCTZ due to risk of volume depletion and hypoK with nausea and vomiting -Methyldopa not available in [place] -Very high risk for developing pre-eclampsia/HELLP again -Will arrange for CV follow-up in [place] clinic as well as with Pharm D  #Persistent Nausea/Vomiting: Has persisted despite IV steroids, zofran and phenergan.  -Management per OB  #Hypokalemia: Secondary to persistent nausea/vomiting. Stable at 3.9 today.  #Headache: CT/MRI/MRV head without evidence of stroke. Has  been resistant to multiple medical therapies including steroids, flexiril, benadryl, tylenol. Underwent occipital nerve blocks but has persistent pain. Has been followed by Neuro. Stress, vomiting, and sleep deprivation likely all contributing. -Management per OBGYN and Neurology  #RUQ Pain: RUQ ultrasound unremarkable. Patient has had prior cholecystectomy. LFTs and bili remain normal. Seen by GI and suspect related to medications as well as constipation.  -Management per GI and OBGYN  #Anxiety/Depression: Very overwhelmed, anxious and tearful throughout hospitalization. Has been followed by psych, social work and the Clinical biochemist. Will need ongoing care as outpatient.  Cardiology will not see over the weekend unless needed. Will arrange for close CardioOb follow-up post-discharge as well as follow-up with PharmD for BP monitoring. Please do not hesitate to reach out with questions or concerns.       For questions or updates, please contact [place] Please consult [email or web address] for contact info under        Signed, Freada Bergeron, MD  07/12/2021, 7:40 AM

## 2021-07-11 NOTE — Plan of Care (Signed)
  Problem: Education: Goal: Knowledge of disease or condition will improve Outcome: Progressing Goal: Knowledge of the prescribed therapeutic regimen will improve Outcome: Progressing Goal: Individualized Educational Video(s) Outcome: Progressing   Problem: Clinical Measurements: Goal: Complications related to the disease process, condition or treatment will be avoided or minimized Outcome: Progressing   Problem: Education: Goal: Knowledge of General Education information will improve Description: Including pain rating scale, medication(s)/side effects and non-pharmacologic comfort measures Outcome: Progressing   Problem: Health Behavior/Discharge Planning: Goal: Ability to manage health-related needs will improve Outcome: Progressing   Problem: Clinical Measurements: Goal: Ability to maintain clinical measurements within normal limits will improve Outcome: Progressing Goal: Will remain free from infection Outcome: Progressing Goal: Diagnostic test results will improve Outcome: Progressing Goal: Respiratory complications will improve Outcome: Progressing Goal: Cardiovascular complication will be avoided Outcome: Progressing   Problem: Activity: Goal: Risk for activity intolerance will decrease Outcome: Progressing   Problem: Nutrition: Goal: Adequate nutrition will be maintained Outcome: Progressing   Problem: Coping: Goal: Level of anxiety will decrease Outcome: Progressing   Problem: Elimination: Goal: Will not experience complications related to bowel motility Outcome: Progressing Goal: Will not experience complications related to urinary retention Outcome: Progressing   Problem: Pain Managment: Goal: General experience of comfort will improve Outcome: Progressing   Problem: Safety: Goal: Ability to remain free from injury will improve Outcome: Progressing   Problem: Skin Integrity: Goal: Risk for impaired skin integrity will decrease Outcome:  Progressing

## 2021-07-12 DIAGNOSIS — R112 Nausea with vomiting, unspecified: Secondary | ICD-10-CM | POA: Diagnosis not present

## 2021-07-12 DIAGNOSIS — O10912 Unspecified pre-existing hypertension complicating pregnancy, second trimester: Secondary | ICD-10-CM | POA: Diagnosis not present

## 2021-07-12 DIAGNOSIS — O365990 Maternal care for other known or suspected poor fetal growth, unspecified trimester, not applicable or unspecified: Secondary | ICD-10-CM | POA: Diagnosis not present

## 2021-07-12 DIAGNOSIS — O09299 Supervision of pregnancy with other poor reproductive or obstetric history, unspecified trimester: Secondary | ICD-10-CM | POA: Diagnosis not present

## 2021-07-12 DIAGNOSIS — F419 Anxiety disorder, unspecified: Secondary | ICD-10-CM | POA: Diagnosis not present

## 2021-07-12 LAB — BASIC METABOLIC PANEL
Anion gap: 9 (ref 5–15)
BUN: 8 mg/dL (ref 6–20)
CO2: 25 mmol/L (ref 22–32)
Calcium: 8.6 mg/dL — ABNORMAL LOW (ref 8.9–10.3)
Chloride: 101 mmol/L (ref 98–111)
Creatinine, Ser: 0.69 mg/dL (ref 0.44–1.00)
GFR, Estimated: >60 mL/min (ref >60)
Glucose, Bld: 80 mg/dL (ref 70–99)
Potassium: 3.9 mmol/L (ref 3.5–5.1)
Sodium: 135 mmol/L (ref 135–145)

## 2021-07-12 MED ORDER — SODIUM CHLORIDE 0.9% FLUSH: 3.0000 mL | INTRAVENOUS | Status: DC | PRN

## 2021-07-12 MED ORDER — PANTOPRAZOLE SODIUM 40 MG PO TBEC
40.0000 mg | DELAYED_RELEASE_TABLET | Freq: Every day | ORAL | Status: DC
  Administered 2021-07-13 – 2021-07-16 (×4): 40 mg via ORAL
  Filled 2021-07-12 (×4): qty 1

## 2021-07-12 MED ORDER — LORAZEPAM 2 MG/ML IJ SOLN
2.0000 mg | Freq: Three times a day (TID) | INTRAMUSCULAR | Status: DC | PRN
  Administered 2021-07-12 – 2021-07-13 (×2): 2 mg via INTRAVENOUS
  Filled 2021-07-12 (×2): qty 1

## 2021-07-12 MED ORDER — LABETALOL HCL 200 MG PO TABS
600.0000 mg | ORAL_TABLET | Freq: Three times a day (TID) | ORAL | Status: DC
  Administered 2021-07-12 – 2021-07-16 (×11): 600 mg via ORAL
  Filled 2021-07-12 (×12): qty 3

## 2021-07-12 MED ORDER — SODIUM CHLORIDE 0.9% FLUSH
3.0000 mL | Freq: Two times a day (BID) | INTRAVENOUS | Status: DC
  Administered 2021-07-12 – 2021-07-20 (×7): 3 mL via INTRAVENOUS

## 2021-07-12 NOTE — Progress Notes (Signed)
Follow up visit with Krystal [name]. She reports that the doctor talked to her about possibly going home today. She feels quite nervous about discharging in her current condition. Krystal [name] reports she is exhausted and overwhelmed and does not believe she is able to care for her children in her current state or that her children will be able to understand their mother's inability to provide them what they need. Chaplain acknowledged the stress of the situation and normalized feelings of guilt associated with the inability to be the mother you feel your children need. These feelings are exacerbated as Krystal [name] is increasingly aware of the ways her own mother neglected to provide proper physical, emotional, mental, and spiritual care for her.  Chaplain provided reflective listening and empathy as Krystal [name] explored her history of trauma and considered ways to balance coping and processing as she also manages the stress of her pregnancy.   Please page as further needs arise.  Donald Prose. Elyn Peers, M.Div. [facility] Chaplain Pager [phone] Office [phone]

## 2021-07-12 NOTE — Progress Notes (Signed)
Chaplain attempted follow up visit with [place] but she was asleep at time of visit. Will continue to follow during hospitalization.   Please page as further needs arise.  Donald Prose. Elyn Peers, M.Div. [facility] Chaplain Pager [phone] Office [phone]

## 2021-07-12 NOTE — Progress Notes (Signed)
Patient ID: Krystal [name], female   DOB: 1990/07/29, 31 y.o.   MRN: [id number]  Krystal [name]) NOTE  Krystal [name] is a 31 y.o. [id number] with Estimated Date of Delivery: 10/26/21   By  early ultrasound [redacted]w[redacted]d [facility]    Fetal presentation is cephalic. Length of Stay:  10  Days  Date of admission:07/02/2021  Subjective: I have talked the last few days with pt about going home Her husband is supportive I believe her anxiety and menstal state would improve with some time at home but each time we talk about it you can tell she is very reluctant  She continues with daily headaches, daily nausea daily anxiety episodes and increasingly is trying to sleep during the day instead of at night, although last night she reported was better I have stopped the IV ativan   Patient reports the fetal movement as active. Patient reports uterine contraction  activity as none. Patient reports  vaginal bleeding as none. Patient describes fluid per vagina as None.  Vitals:  Blood pressure (!) 151/93, pulse 98, temperature 97.9 F (36.6 C), temperature source Oral, resp. rate 18, height '5\' 11"'$  (1.803 m), weight 109.5 kg, last menstrual period 01/19/2021, SpO2 98 %, not currently breastfeeding. Vitals:   07/12/21 0805 07/12/21 1038 07/12/21 1256 07/12/21 1455  BP: (!) 146/94 (!) 142/90 (!) 156/99 (!) 151/93  Pulse: 81 83  98  Resp: '17 18 18 18  '$ Temp: 97.9 F (36.6 C)  97.9 F (36.6 C)   TempSrc: Oral  Oral   SpO2: 98% 97% 98% 98%  Weight:      Height:       Physical Examination:  General appearance - alert, well appearing, and in no distress Abdomen - soft, nontender, nondistended, no masses or organomegaly Fundal Height:  size equals dates Pelvic Exam:  examination not indicated Cervical Exam: Not evaluated.. Extremities: extremities normal, atraumatic, no cyanosis or edema with DTRs 2+ bilaterally Membranes:intact  Fetal Monitoring:  160  apporpriate for ga  Labs:  Results for orders placed or performed during the hospital encounter of 07/02/21 (from the past 24 hour(s))  Basic metabolic panel   Collection Time: 07/12/21  8:00 AM  Result Value Ref Range   Sodium 135 135 - 145 mmol/L   Potassium 3.9 3.5 - 5.1 mmol/L   Chloride 101 98 - 111 mmol/L   CO2 25 22 - 32 mmol/L   Glucose, Bld 80 70 - 99 mg/dL   BUN 8 6 - 20 mg/dL   Creatinine, Ser 0.69 0.44 - 1.00 mg/dL   Calcium 8.6 (L) 8.9 - 10.3 mg/dL   GFR, Estimated >60 >60 mL/min   Anion gap 9 5 - 15     Imaging Studies:    No results found.   Medications:  Scheduled  amLODipine  10 mg Oral Daily   aspirin EC  162 mg Oral Daily   busPIRone  10 mg Oral TID   clonazePAM  1 mg Oral Q8H   dicyclomine  20 mg Oral TID AC   feeding supplement  1 Container Oral TID BM   hydrALAZINE  100 mg Oral Q8H   labetalol  600 mg Oral Q8H   levothyroxine  125 mcg Oral QAC breakfast   loratadine  10 mg Oral Daily   magnesium oxide  400 mg Oral BID   ondansetron  8 mg Oral Q8H   [START ON 07/13/2021] pantoprazole  40 mg Oral Daily  polyethylene glycol  17 g Oral BID   predniSONE  50 mg Oral Q breakfast   prenatal multivitamin  1 tablet Oral Q1200   sodium chloride flush  3 mL Intravenous Q12H   venlafaxine XR  225 mg Oral Daily   I have reviewed the patient's current medications.  ASSESSMENT + Plan:No change in care plan  Clincially she continues to have daily challenges which vary each day, chart reviewed for the last several years and most of these issues are chronic with headaches/depression/anxiety/PTSD/chronic pain issues including RUQ pain with nausea vomiting  being ongoing and certainly more problematic while pregnant   [id number] 73w3dEstimated Date of Delivery: 10/26/21    >CHTN: decent recent control on  Norvasc 10 daily Apresoline 100 mg q8h Labetalol 300 mg q8h-->400q8h for 24 hours, still not optimal control but don't want to increase 2 consecutive  days Increase to 600 mg q8h today Thanks to DR PJohney Frame cardiology,  care and following  >History of pre eclampsia/HELLP: ASA 1882+ BP meds, certainly risks of recurrence is high   >[facility]ogy of Fallot Adequ[facility] vein[facility] will continue to discuss with the NICU team as the pregnancy progresses.  In theory tetralogy of Fallot can deliver here but maybe not if extreme prematurity  >FGR: 1.5% with 96% Doppler ratio, AFI 6.4 cm Daily monitor strip  BMZ 5/12, 5/13 Next sonogram 07/24/21  >GI issues: Nausea/vomiting is improving, not resolved, will transition for methyprednisolone IV to oral prednisone 50 mg daily   RUQ pain is improved, functional with/vs psychological overlay No evidence of RUQ source, labs sonogram normal Continue bentyl, increase daily miralax  Thanks to Dr OPaulita Fujitafor his input and assessment  >Anxiety/depression: Effexor XR 225 mg Increase klonopin '1mg'$  to every 8 hours, vistaril prn, ambien prn Guided meditation with chaplain seems to be helpful Disturbing dreams/nightmares and experiencing past traumas, discussed at length CB strategies for de escalation, spent 40 minutes with patient  Thanks to Dr MLeverne Humbles psychiatry for her input and help  >Headache: waxes and wanes, largely muscular contraction/anxiety related Thanks to neurology for their help and input  Improved overall today, certainly her physical state, be it GI, abdominal pain, headache is linked closely with her state of mind We discussed that openly and fully.  I am hopeful she will stabilize, understanding it will be daily challenges and good days bad days for all  I am hopeful she will ready for discharge tomorrow Pt stated yesterday she wants to go home but today she doesn't  To some degree we are in a pattern of clinical/diagnostic/therapeutic waxing and waning with various issues on any given day  Overall pretty stable, I tend to think we are about as stable as we are  going to get  BP will continue to be a direct OB challenge  Sonogram 2 weeks  LFlorian Buff5/19/2023,4:30 PM     Patient ID: Krystal [name] female   DOB: 702/04/1990 31y.o.   MRN: [id number] ID: Krystal [name] female   DOB: 7Jun 18, 1992 31y.o.   MRN: [id number]

## 2021-07-13 DIAGNOSIS — R112 Nausea with vomiting, unspecified: Secondary | ICD-10-CM | POA: Diagnosis not present

## 2021-07-13 DIAGNOSIS — O365990 Maternal care for other known or suspected poor fetal growth, unspecified trimester, not applicable or unspecified: Secondary | ICD-10-CM | POA: Diagnosis not present

## 2021-07-13 MED ORDER — OXYCODONE HCL 5 MG PO TABS
5.0000 mg | ORAL_TABLET | ORAL | Status: DC | PRN
  Filled 2021-07-13: qty 1

## 2021-07-13 MED ORDER — DIPHENHYDRAMINE HCL 25 MG PO CAPS
50.0000 mg | ORAL_CAPSULE | Freq: Four times a day (QID) | ORAL | Status: DC | PRN
  Filled 2021-07-13: qty 2

## 2021-07-13 MED ORDER — DIPHENHYDRAMINE HCL 12.5 MG/5ML PO ELIX: 50.0000 mg | ORAL_SOLUTION | Freq: Four times a day (QID) | ORAL | Status: DC | PRN

## 2021-07-13 MED ORDER — OXYCODONE HCL 5 MG PO TABS
10.0000 mg | ORAL_TABLET | ORAL | Status: DC | PRN
  Administered 2021-07-13 – 2021-07-16 (×8): 10 mg via ORAL
  Filled 2021-07-13 (×8): qty 2

## 2021-07-13 MED ORDER — DIPHENHYDRAMINE HCL 12.5 MG/5ML PO LIQD
50.0000 mg | Freq: Four times a day (QID) | ORAL | Status: DC | PRN
  Administered 2021-07-13 – 2021-07-14 (×2): 50 mg via ORAL
  Filled 2021-07-13 (×5): qty 20

## 2021-07-13 MED ORDER — LORAZEPAM 2 MG/ML IJ SOLN
2.0000 mg | Freq: Four times a day (QID) | INTRAMUSCULAR | Status: DC | PRN
  Administered 2021-07-13 – 2021-07-21 (×22): 2 mg via INTRAVENOUS
  Filled 2021-07-13 (×25): qty 1

## 2021-07-13 MED ORDER — PROMETHAZINE HCL 25 MG PO TABS: 25.0000 mg | ORAL_TABLET | Freq: Four times a day (QID) | ORAL | Status: DC | PRN

## 2021-07-13 NOTE — Progress Notes (Signed)
Patient ID: Krystal [name], female   DOB: 01-08-1991, 31 y.o.   MRN: [id number]  [place]) NOTE  Mycala Warshawsky Noe is a 31 y.o. [id number] with Estimated Date of Delivery: 10/26/21   By  early ultrasound 55[facility]0d[facility]    Fetal presentation is cephalic. Length of Stay:  11  Days  Date of admission:07/02/2021  Subjective: I have talked the last few days with pt about going home Her husband is supportive I believe her anxiety and menstal state would possibly improve with some time at home but each time we talk about it you can tell she is very reluctant  She continues with daily headaches, daily nausea daily anxiety episodes and increasingly is trying to sleep during the day instead of at night, did not sleep well last night   Patient reports the fetal movement as active. Patient reports uterine contraction  activity as none. Patient reports  vaginal bleeding as none. Patient describes fluid per vagina as None.  Vitals:  Blood pressure 137/89, pulse 83, temperature 97.9 F (36.6 C), temperature source Oral, resp. rate 18, height '5\' 11"'$  (1.803 m), weight 103.9 kg, last menstrual period 01/19/2021, SpO2 99 %, not currently breastfeeding. Vitals:   07/12/21 2039 07/12/21 2115 07/13/21 0541 07/13/21 0717  BP: (!) 160/87 (!) 144/76 (!) 152/95 137/89  Pulse: 84 79 82 83  Resp: '20 19 19 18  '$ Temp: 98 F (36.7 C)  97.9 F (36.6 C) 97.9 F (36.6 C)  TempSrc: Oral  Oral Oral  SpO2: 98% 100% 100% 99%  Weight:   103.9 kg   Height:       Physical Examination:  General appearance - alert, well appearing, and in no distress Abdomen - soft, nontender, nondistended, no masses or organomegaly Fundal Height:  size equals dates Pelvic Exam:  examination not indicated Cervical Exam: Not evaluated.. Extremities: extremities normal, atraumatic, no cyanosis or edema with DTRs 2+ bilaterally Membranes:intact  Fetal Monitoring:  160 apporpriate  for ga  Labs:  No results found for this or any previous visit (from the past 24 hour(s)).    Imaging Studies:    No results found.   Medications:  Scheduled  amLODipine  10 mg Oral Daily   aspirin EC  162 mg Oral Daily   busPIRone  10 mg Oral TID   clonazePAM  1 mg Oral Q8H   dicyclomine  20 mg Oral TID AC   feeding supplement  1 Container Oral TID BM   hydrALAZINE  100 mg Oral Q8H   labetalol  600 mg Oral Q8H   levothyroxine  125 mcg Oral QAC breakfast   loratadine  10 mg Oral Daily   magnesium oxide  400 mg Oral BID   ondansetron  8 mg Oral Q8H   pantoprazole  40 mg Oral Daily   polyethylene glycol  17 g Oral BID   predniSONE  50 mg Oral Q breakfast   prenatal multivitamin  1 tablet Oral Q1200   sodium chloride flush  3 mL Intravenous Q12H   venlafaxine XR  225 mg Oral Daily   I have reviewed the patient's current medications.  ASSESSMENT + Plan:No change in care plan  Clincially she continues to have daily challenges which vary each day, chart reviewed for the last several years and most of these issues are chronic with headaches/depression/anxiety/PTSD/chronic pain issues including RUQ pain with nausea vomiting  being ongoing and certainly more problematic while pregnant  I reviewed all  medical care over the past 2 years and most of these issues are chronic intermittent intensity including cyclical nausea and vomiting, RUQ pain, headaches and her ongoing struggles with depression and anxiety   [id number] 98w3dEstimated Date of Delivery: 10/26/21    >CHTN: decent recent control on  Norvasc 10 daily Apresoline 100 mg q8h Labetalol 300 mg q8h-->400-->600 mg q8h for 24 hours Thanks to DR PJohney Frame cardiology,  care and following  >History of pre eclampsia/HELLP: ASA 1973+ BP meds, certainly risks of recurrence is high   >Tetralogy of Fallot Adequate pulmonary vein[facility] will continue to discuss with the NICU team as the pregnancy progresses.  In theory  tetralogy of Fallot can deliver here but maybe not if extreme prematurity and FGR exponentially increases complication rate  >FGR: 1.5% with 96% Doppler ratio, AFI 6.4 cm Daily monitor strip  BMZ 5/12, 5/13 Next sonogram 07/24/21  >GI issues: Nausea/vomiting continues, on high dose steroid with prednisone 50 daily = NAUSEA CONTROL WITH PHENRGAN, ZOFRAN, can't take reglan due to allergy   RUQ pain is improved, functional with/vs psychological overlay No evidence of RUQ source, labs sonogram normal Continue bentyl, increase daily miralax to 2 sccops and now having normal BMs  Thanks to Dr OPaulita Fujitafor his input and assessment  >Anxiety/depression: Effexor XR 225 mg Increase klonopin '1mg'$  to every 8 hours, vistaril prn, ambien prn Guided meditation with chaplain seems to be helpful Disturbing dreams/nightmares and experiencing past traumas, discussed at length CB strategies for de escalation, spent 40 minutes with patient  Thanks to Dr MLeverne Humbles psychiatry and chaplain  for their input and help  >Headache: waxes and wanes, largely muscular contraction/anxiety related, neurology tried injections with minimal improvement, no real clinical change in days Thanks to neurology for their help and input    I am hopeful she will ready for discharge tomorrow Talked with pt and husband for 20 minutes again He is supportive of her coming home, they have child care and housekeeping in place, she would not have to be primary caregiver She seems reticent to go home but doesn't give me a reason for her reluctance  To some degree we are in a pattern of clinical/diagnostic/therapeutic waxing and waning with various issues on any given day  I think we are at a as good as it gets stage and if there is a window for going home this would be it, it is worth a try to see if she improves emotionally/psychologically at home  BP and FGR  will continue to be a direct OB challenge  Sonogram 2 weeks  LMertie Clause Josette Shimabukuro 07/13/2021,11:12 AM

## 2021-07-14 DIAGNOSIS — R112 Nausea with vomiting, unspecified: Secondary | ICD-10-CM | POA: Diagnosis not present

## 2021-07-14 DIAGNOSIS — O365990 Maternal care for other known or suspected poor fetal growth, unspecified trimester, not applicable or unspecified: Secondary | ICD-10-CM | POA: Diagnosis not present

## 2021-07-14 DIAGNOSIS — O10912 Unspecified pre-existing hypertension complicating pregnancy, second trimester: Secondary | ICD-10-CM | POA: Diagnosis not present

## 2021-07-14 DIAGNOSIS — F419 Anxiety disorder, unspecified: Secondary | ICD-10-CM | POA: Diagnosis not present

## 2021-07-14 MED ORDER — SODIUM CHLORIDE 0.9 % IV SOLN
INTRAVENOUS | Status: DC | PRN
Start: 2021-07-14 — End: 2021-07-15

## 2021-07-14 MED ORDER — PREDNISONE 20 MG PO TABS
30.0000 mg | ORAL_TABLET | Freq: Every day | ORAL | Status: DC
  Administered 2021-07-15: 30 mg via ORAL
  Filled 2021-07-14: qty 2

## 2021-07-14 NOTE — Progress Notes (Signed)
Patient ID: Krystal [name], female   DOB: 10-28-1990, 31 y.o.   MRN: [id number] [place]) NOTE  Krystal [name] is a 31 y.o. [id number] with Estimated Date of Delivery: 10/26/21   By  early ultrasound [redacted]w[redacted]d who is admitted for uncontrolled hypertension, FGR, anxiety/depression, nausea vomiting.    Fetal presentation is unsure. Length of Stay:  12  Days  Date of admission:07/02/2021  Subjective: Father passed away yesterday Derailed the plan for possible discharge Pt is losing weight again throwing up more Anxiety depression continues Patient reports the fetal movement as active. Patient reports uterine contraction  activity as none. Patient reports  vaginal bleeding as none. Patient describes fluid per vagina as None.  Vitals:  Blood pressure (!) 161/97, pulse 88, temperature 97.6 F (36.4 C), temperature source Oral, resp. rate 19, height '5\' 11"'$  (1.803 m), weight 102.9 kg, last menstrual period 01/19/2021, SpO2 96 %, not currently breastfeeding. Vitals:   07/13/21 1955 07/13/21 2344 07/14/21 0406 07/14/21 0856  BP: 140/85 136/83 125/77 (!) 161/97  Pulse: 77 77 83 88  Resp: '18 16 18 19  '$ Temp: 97.9 F (36.6 C) 97.9 F (36.6 C) 98 F (36.7 C) 97.6 F (36.4 C)  TempSrc: Oral Oral Oral Oral  SpO2: 96% 98% 97% 96%  Weight:   102.9 kg   Height:       Physical Examination:  General appearance - alert, well appearing, and in no distress Abdomen - soft, nontender, nondistended, no masses or organomegaly Fundal Height:  size equals dates Pelvic Exam:  examination not indicated Cervical Exam: Not evaluated. . Extremities: extremities normal, atraumatic, no cyanosis or edema with DTRs 2+ bilaterally Membranes:intact  Fetal Monitoring:     150s no decels appropriate for GA  Labs:  No results found for this or any previous visit (from the past 24 hour(s)).  Imaging Studies:    No results found.   Medications:  Scheduled  amLODipine  10 mg Oral  Daily   aspirin EC  162 mg Oral Daily   busPIRone  10 mg Oral TID   clonazePAM  1 mg Oral Q8H   dicyclomine  20 mg Oral TID AC   feeding supplement  1 Container Oral TID BM   hydrALAZINE  100 mg Oral Q8H   labetalol  600 mg Oral Q8H   levothyroxine  125 mcg Oral QAC breakfast   loratadine  10 mg Oral Daily   magnesium oxide  400 mg Oral BID   ondansetron  8 mg Oral Q8H   pantoprazole  40 mg Oral Daily   polyethylene glycol  17 g Oral BID   [START ON 07/15/2021] predniSONE  30 mg Oral Q breakfast   prenatal multivitamin  1 tablet Oral Q1200   sodium chloride flush  3 mL Intravenous Q12H   venlafaxine XR  225 mg Oral Daily   I have reviewed the patient's current medications.  ASSESSMENT + PLan: [id number] Date of Delivery: 10/26/21   >Chronic hypertension, multi drug resistant, with history of HELLP with 1 pregnancy, pre eclampsia with the other Apresoline 100 q8 Norvasc 10 qd Labetalol 600 q8h Overall control is reasonable, occasional values that are high but overall reasonable control, continue to titrate doses as needed  Thanks to Dr PeJohney Frameor her ongoing involvement  >FGR: EFW(1.5%) with elevated Doppler ratios, at last sonogram 07/02/21 there was not absent or revers flow noted.  Repeat sonogram 07/17/21 is scheduled, will contact to make sure it is changed  to being performed here in the hospital  >Tetralogy of Fallot: ECHO 07/04/21 IMPRESSIONS Fetal echocardiogram performed at 23 weeks and 5 days because of concern for fetal congenital heart disease. 1. 2. 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 The annulus measures 3.4 mm, Boston z-score -2.7. 3. 4. Moderate anterior malalignment conoventricular septal defect. 5. 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 Normal systemic venous return 8. One left sided pulmonary vein returns normally to the left atrium 9. Normal biventricular size and systolic  function 10. Normal ductus arteriosus with right to left shunt 11. PR interval 104, heart rate 155 bpm  Based on the returning pulmonary vein, initial surgical repair would be delayed most likely  The delivery location is deemed possible at [facility] but will depend on size of baby at delivery and other clinical issues  >Headaches: cleared by neurology regarding intracranial pathology/stroke, felt to be muscle contraction.  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 Erlinda Hong has kindly agreed to come do bilateral occipital nerve blocks for her tomorrow - Cognitive behavioral therapy /stress mgmt/relaxation training if available inpatient. If she is discharged prior to delivery would recommend referral for biofeedback. - Other nonpharmacologic interventions: heat, ice, massage, rest, avoiding triggers (dietary etc)  >Nausea/vomiting/GI issues: Long standing issue, felt to be functional bowel disorder, S/P cholecystectomy last year with no real improvement in her symptoms Emesis was improved on high dose steroids but has worsened again over the past few days with weight loss.  Titrating down the prednisone from IV solumedrol to prednisone 50 to 30 mg daily,  Talked with pt and husband regarding feeding tube, which generally is not long term successful but I much prefer it to PICC or midline feeding in pregnancy.  So a consult for the core track was pt in and hopefully placed tomorrow  >Anxiety:depression: long standing ongoing problem/issue with daily struggles despite klonopin 1 mg q8h, effexor xr 225, intermittent ativan/benadryl Father passed away yesterday which was expected.  Chaplain is involved.  Pt has received OP therapy for several years intermittently      Florian Buff 07/14/2021,12:28 PM

## 2021-07-14 NOTE — Progress Notes (Addendum)
Nutrition Follow-up  DOCUMENTATION CODES:  N/A   INTERVENTION:  Noted consideration for placement of feeding tube on 5/22 Consider Continuous transpyloric feedings of Jevity 1.2 Initiate tube feedings  at 20 ml/hr and advance by 10 ml q 8 hours to a goal of 83 ml/hr Supplement with 100 mg thiamine   Slow advancement of enteral support to reduce risk of refeeding syn CMP X 3 days,    NUTRITION DIAGNOSIS:  Inadequate oral intake related to nausea, vomiting as evidenced by per patient/family report.  GOAL:  Patient will meet greater than or equal to 90% of their needs   MONITOR:  PO intake  REASON FOR ASSESSMENT:   Antenatal, LOS    ASSESSMENT:  Now 25 1/7 weeks, continues to be  admitted for uncontrolled hypertension, FGR, anxiety/depression, nausea vomiting.. Pt with weight loss past 4 days.Placement of feeding tube under consideration for  nutrition as pt unable to consume/retain adequate intake to support pregnancy  majority of hospital stay Pt likely with mild - moderate degree of malnutrition     Pt unable to tolerate Boost supplement  Jevity 1.2 at goal rate of 83 ml/hr will provide 2400 Kcal  111 g protein 1640 ml free water   Diet Order:   Diet Order             Diet regular Room service appropriate? Yes; Fluid consistency: Thin  Diet effective now                   EDUCATION NEEDS:   No education needs have been identified at this time  Skin:  Skin Assessment: Reviewed RN Assessment  Last BM:   5/21  Height:   Ht Readings from Last 1 Encounters:  07/02/21 '5\' 11"'$  (1.803 m)    Weight:   Wt Readings from Last 1 Encounters:  07/14/21 102.9 kg    Ideal Body Weight:   155 lbs  BMI:  Body mass index is 31.63 kg/m.  Estimated Nutritional Needs:   Kcal:  2400-2600  Protein:  105 -115 g  Fluid:  > 2.4 L

## 2021-07-15 ENCOUNTER — Inpatient Hospital Stay (HOSPITAL_COMMUNITY): Payer: Commercial Managed Care - HMO

## 2021-07-15 DIAGNOSIS — Z3A25 25 weeks gestation of pregnancy: Secondary | ICD-10-CM | POA: Diagnosis not present

## 2021-07-15 DIAGNOSIS — R112 Nausea with vomiting, unspecified: Secondary | ICD-10-CM | POA: Diagnosis not present

## 2021-07-15 DIAGNOSIS — O1422 HELLP syndrome (HELLP), second trimester: Secondary | ICD-10-CM | POA: Diagnosis not present

## 2021-07-15 MED ORDER — DIPHENHYDRAMINE HCL 50 MG/ML IJ SOLN
25.0000 mg | Freq: Four times a day (QID) | INTRAMUSCULAR | Status: DC | PRN
  Administered 2021-07-15 – 2021-07-20 (×13): 25 mg via INTRAVENOUS
  Filled 2021-07-15 (×18): qty 1

## 2021-07-15 MED ORDER — DEXTROSE-NACL 5-0.9 % IV SOLN: INTRAVENOUS | Status: DC

## 2021-07-15 MED ORDER — PREDNISONE 20 MG PO TABS
30.0000 mg | ORAL_TABLET | Freq: Every day | ORAL | Status: DC
  Administered 2021-07-16: 30 mg via ORAL
  Filled 2021-07-15: qty 2

## 2021-07-15 MED ORDER — PREDNISONE 20 MG PO TABS: 10.0000 mg | ORAL_TABLET | Freq: Every day | ORAL | Status: DC

## 2021-07-15 MED ORDER — THIAMINE HCL 100 MG PO TABS
100.0000 mg | ORAL_TABLET | Freq: Every day | ORAL | Status: DC
  Administered 2021-07-15 – 2021-07-16 (×2): 100 mg via ORAL
  Filled 2021-07-15 (×2): qty 1

## 2021-07-15 MED ORDER — JEVITY 1.2 CAL PO LIQD
1000.0000 mL | ORAL | Status: DC
  Administered 2021-07-15 – 2021-07-19 (×3): 20 mL
  Administered 2021-07-21: 1000 mL
  Filled 2021-07-15 (×14): qty 1000

## 2021-07-15 MED ORDER — PREDNISONE 20 MG PO TABS: 20.0000 mg | ORAL_TABLET | Freq: Every day | ORAL | Status: DC

## 2021-07-15 MED ORDER — COMPLETENATE 29-1 MG PO CHEW
1.0000 | CHEWABLE_TABLET | Freq: Every day | ORAL | Status: DC
  Administered 2021-07-16: 1 via ORAL

## 2021-07-15 NOTE — Progress Notes (Signed)
Removed monitor this afternoon at patient request when Cortrak team came to place feeding tube.  Heart rate variability consistent with readings during this entire hospitalization.  New finding on EFM this afternoon was prolonged decels, each with return to baseline heart rate.  Asked Dr. Rip Harbour to review the [facility] and he said it was fine to leave monitor off for now and resume at the next scheduled interval.  No new orders received.

## 2021-07-15 NOTE — Progress Notes (Signed)
Attemped follow up visit with Elky, but she was sleeping. Chaplain spoke with pt's nurse who updated chaplain on hte news that pt's father died over the weekend and she was placed on a feeding tube earlier today.  Will continue to follow.  Please page as further needs arise.  Donald Prose. Elyn Peers, M.Div. [facility] Chaplain Pager [phone] Office [phone]

## 2021-07-15 NOTE — Procedures (Signed)
Cortrak  Person Inserting Tube:  Lemmie Vanlanen L, RD Tube Type:  Cortrak - 43 inches Tube Size:  10 Tube Location:  Left nare Secured by: Bridle Technique Used to Measure Tube Placement:  Marking at nare/corner of mouth Cortrak Secured At:  90 cm   Cortrak Tube Team Note:  Consult received to place a Cortrak feeding tube.   X-ray is required, abdominal x-ray has been ordered by the Cortrak team. Please confirm tube placement before using the Cortrak tube.   If the tube becomes dislodged please keep the tube and contact the Cortrak team at [email or web address] ([email or web address]) for replacement.  If after hours and replacement cannot be delayed, place a NG tube and confirm placement with an abdominal x-ray.    Mea Ozga RD, LDN Clinical Dietitian See AMiON for contact information.    

## 2021-07-15 NOTE — Progress Notes (Signed)
Patient ID: [name], female   DOB: 09-15-1990, 31 y.o.   MRN: [id number] [facility] COMPREHENSIVE PROGRESS NOTE  Krystal [name] is a 31 y.o. [id number] at [redacted]w[redacted]d who is admitted for uncontrolled HTN, N/V, FGR and anxiety/depression.   Fetal presentation is unsure. Length of Stay:  13  Days  Subjective: As best she can with recent passing of her father.  N/V, anxiety and depression continue Patient reports good fetal movement.  She reports no uterine contractions, no bleeding and no loss of fluid per vagina.  Vitals:  Blood pressure 130/85, pulse 77, temperature (!) 97.5 F (36.4 C), temperature source Axillary, resp. rate 18, height '5\' 11"'$  (1.803 m), weight 101.6 kg, last menstrual period 01/19/2021, SpO2 100 %, not currently breastfeeding.  Physical Examination: Lungs clear Heart RRR Abd soft + BS gravid Ext non tender  Fetal Monitoring:  Baseline: 150's bpm  Labs:  No results found for this or any previous visit (from the past 24 hour(s)).  Imaging Studies:    NA   Medications:  Scheduled  amLODipine  10 mg Oral Daily   aspirin EC  162 mg Oral Daily   busPIRone  10 mg Oral TID   clonazePAM  1 mg Oral Q8H   dicyclomine  20 mg Oral TID AC   feeding supplement  1 Container Oral TID BM   hydrALAZINE  100 mg Oral Q8H   labetalol  600 mg Oral Q8H   levothyroxine  125 mcg Oral QAC breakfast   loratadine  10 mg Oral Daily   magnesium oxide  400 mg Oral BID   ondansetron  8 mg Oral Q8H   pantoprazole  40 mg Oral Daily   polyethylene glycol  17 g Oral BID   predniSONE  30 mg Oral Q breakfast   prenatal vitamin w/FE, FA  1 tablet Oral Q1200   sodium chloride flush  3 mL Intravenous Q12H   venlafaxine XR  225 mg Oral Daily   I have reviewed the patient's current medications.  ASSESSMENT: IUP 25 2/7 weeks CHTN FGR Tetralogy of fallot N/V Anxiety/depression HA  PLAN: Stable form an OB stable point. Appreciate all the consultants. For feeding  tube today Continue routine antenatal care.   MChancy Milroy5/22/2023,12:24 PM

## 2021-07-15 NOTE — Progress Notes (Signed)
Tube feeding began at 2200 due to delay in receiving kangaroo pump. No order placed for flush amount. Constant MD and Hospitalist consulted. Per adult tube feeding protocol, flush amount set at 30 ml/hr.

## 2021-07-16 DIAGNOSIS — O1422 HELLP syndrome (HELLP), second trimester: Secondary | ICD-10-CM | POA: Diagnosis not present

## 2021-07-16 DIAGNOSIS — Z3A25 25 weeks gestation of pregnancy: Secondary | ICD-10-CM | POA: Diagnosis not present

## 2021-07-16 DIAGNOSIS — R112 Nausea with vomiting, unspecified: Secondary | ICD-10-CM | POA: Diagnosis not present

## 2021-07-16 MED ORDER — PANTOPRAZOLE 2 MG/ML SUSPENSION: 40.0000 mg | Freq: Every day | ORAL | Status: DC

## 2021-07-16 MED ORDER — DICYCLOMINE HCL 20 MG PO TABS
20.0000 mg | ORAL_TABLET | Freq: Three times a day (TID) | ORAL | Status: DC
  Administered 2021-07-16 – 2021-07-21 (×14): 20 mg via ORAL
  Filled 2021-07-16 (×17): qty 1

## 2021-07-16 MED ORDER — CLONAZEPAM 0.5 MG PO TBDP
1.0000 mg | ORAL_TABLET | Freq: Three times a day (TID) | ORAL | Status: DC
  Administered 2021-07-16 – 2021-07-19 (×8): 1 mg via ORAL
  Filled 2021-07-16 (×10): qty 2

## 2021-07-16 MED ORDER — PREDNISONE 20 MG PO TABS
20.0000 mg | ORAL_TABLET | Freq: Every day | ORAL | Status: AC
  Administered 2021-07-18 – 2021-07-20 (×3): 20 mg via ORAL
  Filled 2021-07-16 (×3): qty 1

## 2021-07-16 MED ORDER — DICYCLOMINE HCL 20 MG PO TABS
20.0000 mg | ORAL_TABLET | Freq: Three times a day (TID) | ORAL | Status: DC
  Filled 2021-07-16 (×2): qty 1

## 2021-07-16 MED ORDER — BUSPIRONE HCL 10 MG PO TABS
10.0000 mg | ORAL_TABLET | Freq: Three times a day (TID) | ORAL | Status: DC
  Administered 2021-07-16 – 2021-07-19 (×8): 10 mg via ORAL
  Filled 2021-07-16 (×12): qty 1

## 2021-07-16 MED ORDER — MAGNESIUM OXIDE -MG SUPPLEMENT 400 (240 MG) MG PO TABS
400.0000 mg | ORAL_TABLET | Freq: Two times a day (BID) | ORAL | Status: DC
Start: 2021-07-16 — End: 2021-07-21
  Administered 2021-07-16 – 2021-07-21 (×10): 400 mg via ORAL
  Filled 2021-07-16 (×10): qty 1

## 2021-07-16 MED ORDER — OXYCODONE HCL 5 MG PO TABS
5.0000 mg | ORAL_TABLET | ORAL | Status: DC | PRN
  Administered 2021-07-17 – 2021-07-18 (×6): 5 mg via ORAL
  Filled 2021-07-16 (×9): qty 1

## 2021-07-16 MED ORDER — CYCLOBENZAPRINE HCL 10 MG PO TABS
10.0000 mg | ORAL_TABLET | Freq: Three times a day (TID) | ORAL | Status: DC | PRN
Start: 2021-07-16 — End: 2021-07-16

## 2021-07-16 MED ORDER — LABETALOL HCL 200 MG PO TABS
600.0000 mg | ORAL_TABLET | Freq: Three times a day (TID) | ORAL | Status: DC
  Administered 2021-07-16: 600 mg
  Filled 2021-07-16: qty 3

## 2021-07-16 MED ORDER — HYDRALAZINE HCL 50 MG PO TABS
100.0000 mg | ORAL_TABLET | Freq: Three times a day (TID) | ORAL | Status: DC
  Administered 2021-07-16 – 2021-07-21 (×15): 100 mg via ORAL
  Filled 2021-07-16 (×15): qty 2

## 2021-07-16 MED ORDER — HYDROMORPHONE HCL 1 MG/ML IJ SOLN
1.0000 mg | Freq: Once | INTRAMUSCULAR | Status: AC
  Administered 2021-07-16: 1 mg via INTRAVENOUS
  Filled 2021-07-16: qty 1

## 2021-07-16 MED ORDER — LABETALOL HCL 200 MG PO TABS
600.0000 mg | ORAL_TABLET | Freq: Three times a day (TID) | ORAL | Status: DC
  Administered 2021-07-16 – 2021-07-21 (×15): 600 mg via ORAL
  Filled 2021-07-16 (×15): qty 3

## 2021-07-16 MED ORDER — ZOLPIDEM TARTRATE 5 MG PO TABS
5.0000 mg | ORAL_TABLET | Freq: Every evening | ORAL | Status: DC | PRN
  Administered 2021-07-17: 5 mg via ORAL
  Filled 2021-07-16: qty 1

## 2021-07-16 MED ORDER — AMLODIPINE BESYLATE 5 MG PO TABS
10.0000 mg | ORAL_TABLET | Freq: Every day | ORAL | Status: DC
  Administered 2021-07-17 – 2021-07-24 (×8): 10 mg via ORAL
  Filled 2021-07-16 (×8): qty 2

## 2021-07-16 MED ORDER — OXYCODONE HCL 5 MG PO TABS: 10.0000 mg | ORAL_TABLET | ORAL | Status: DC | PRN

## 2021-07-16 MED ORDER — PREDNISONE 20 MG PO TABS: 20.0000 mg | ORAL_TABLET | Freq: Every day | ORAL | Status: DC

## 2021-07-16 MED ORDER — AMLODIPINE BESYLATE 5 MG PO TABS: 10.0000 mg | ORAL_TABLET | Freq: Every day | ORAL | Status: DC

## 2021-07-16 MED ORDER — ACETAMINOPHEN 325 MG PO TABS: 650.0000 mg | ORAL_TABLET | ORAL | Status: DC | PRN

## 2021-07-16 MED ORDER — PROMETHAZINE HCL 25 MG PO TABS
25.0000 mg | ORAL_TABLET | Freq: Four times a day (QID) | ORAL | Status: DC | PRN
  Filled 2021-07-16: qty 1

## 2021-07-16 MED ORDER — PREDNISONE 20 MG PO TABS: 10.0000 mg | ORAL_TABLET | Freq: Every day | ORAL | Status: DC

## 2021-07-16 MED ORDER — CALCIUM CARBONATE ANTACID 500 MG PO CHEW
2.0000 | CHEWABLE_TABLET | ORAL | Status: DC | PRN
  Filled 2021-07-16: qty 2

## 2021-07-16 MED ORDER — ASPIRIN 81 MG PO CHEW
162.0000 mg | CHEWABLE_TABLET | Freq: Every day | ORAL | Status: DC
  Administered 2021-07-17 – 2021-07-21 (×5): 162 mg via ORAL
  Filled 2021-07-16 (×5): qty 2

## 2021-07-16 MED ORDER — MAGNESIUM OXIDE -MG SUPPLEMENT 400 (240 MG) MG PO TABS: 400.0000 mg | ORAL_TABLET | Freq: Two times a day (BID) | ORAL | Status: DC

## 2021-07-16 MED ORDER — OXYCODONE HCL 5 MG PO TABS: 5.0000 mg | ORAL_TABLET | ORAL | Status: DC | PRN

## 2021-07-16 MED ORDER — POLYETHYLENE GLYCOL 3350 17 G PO PACK
17.0000 g | PACK | Freq: Two times a day (BID) | ORAL | Status: DC
  Administered 2021-07-16 – 2021-07-19 (×6): 17 g via ORAL
  Filled 2021-07-16 (×9): qty 1

## 2021-07-16 MED ORDER — PANTOPRAZOLE 2 MG/ML SUSPENSION
40.0000 mg | Freq: Every day | ORAL | Status: DC
Start: 2021-07-17 — End: 2021-07-21
  Administered 2021-07-17 – 2021-07-21 (×5): 40 mg via ORAL
  Filled 2021-07-16 (×6): qty 20

## 2021-07-16 MED ORDER — THIAMINE HCL 100 MG PO TABS: 100.0000 mg | ORAL_TABLET | Freq: Every day | ORAL | Status: DC

## 2021-07-16 MED ORDER — CLONAZEPAM 0.5 MG PO TBDP: 1.0000 mg | ORAL_TABLET | Freq: Three times a day (TID) | ORAL | Status: DC

## 2021-07-16 MED ORDER — LEVOTHYROXINE SODIUM 25 MCG PO TABS
125.0000 ug | ORAL_TABLET | Freq: Every day | ORAL | Status: DC
  Administered 2021-07-17 – 2021-07-21 (×5): 125 ug via ORAL
  Filled 2021-07-16 (×5): qty 5

## 2021-07-16 MED ORDER — ASPIRIN 81 MG PO CHEW: 162.0000 mg | CHEWABLE_TABLET | Freq: Every day | ORAL | Status: DC

## 2021-07-16 MED ORDER — LEVOTHYROXINE SODIUM 25 MCG PO TABS: 125.0000 ug | ORAL_TABLET | Freq: Every day | ORAL | Status: DC

## 2021-07-16 MED ORDER — CYCLOBENZAPRINE HCL 10 MG PO TABS
10.0000 mg | ORAL_TABLET | Freq: Three times a day (TID) | ORAL | Status: DC | PRN
  Administered 2021-07-16 – 2021-07-18 (×2): 10 mg via ORAL
  Filled 2021-07-16 (×3): qty 1

## 2021-07-16 MED ORDER — PROMETHAZINE HCL 25 MG PO TABS: 25.0000 mg | ORAL_TABLET | Freq: Four times a day (QID) | ORAL | Status: DC | PRN

## 2021-07-16 MED ORDER — PREDNISONE 20 MG PO TABS: 30.0000 mg | ORAL_TABLET | Freq: Every day | ORAL | Status: DC

## 2021-07-16 MED ORDER — PREDNISONE 20 MG PO TABS
30.0000 mg | ORAL_TABLET | Freq: Every day | ORAL | Status: AC
  Administered 2021-07-17: 30 mg via ORAL
  Filled 2021-07-16: qty 2

## 2021-07-16 MED ORDER — BUSPIRONE HCL 10 MG PO TABS
10.0000 mg | ORAL_TABLET | Freq: Three times a day (TID) | ORAL | Status: DC
Start: 2021-07-16 — End: 2021-07-16
  Filled 2021-07-16 (×2): qty 1

## 2021-07-16 MED ORDER — HYDRALAZINE HCL 50 MG PO TABS
100.0000 mg | ORAL_TABLET | Freq: Three times a day (TID) | ORAL | Status: DC
  Administered 2021-07-16: 100 mg
  Filled 2021-07-16: qty 2

## 2021-07-16 MED ORDER — OXYCODONE HCL 5 MG PO TABS
10.0000 mg | ORAL_TABLET | ORAL | Status: DC | PRN
Start: 2021-07-16 — End: 2021-07-21
  Administered 2021-07-19 – 2021-07-21 (×7): 10 mg via ORAL
  Filled 2021-07-16 (×9): qty 2

## 2021-07-16 MED ORDER — LORATADINE 10 MG PO TABS
10.0000 mg | ORAL_TABLET | Freq: Every day | ORAL | Status: DC
Start: 2021-07-17 — End: 2021-07-21
  Administered 2021-07-17 – 2021-07-21 (×5): 10 mg via ORAL
  Filled 2021-07-16 (×5): qty 1

## 2021-07-16 MED ORDER — ZOLPIDEM TARTRATE 5 MG PO TABS: 5.0000 mg | ORAL_TABLET | Freq: Every evening | ORAL | Status: DC | PRN

## 2021-07-16 MED ORDER — CALCIUM CARBONATE ANTACID 500 MG PO CHEW
2.0000 | CHEWABLE_TABLET | ORAL | Status: DC | PRN
Start: 2021-07-16 — End: 2021-07-16

## 2021-07-16 MED ORDER — PREDNISONE 20 MG PO TABS
10.0000 mg | ORAL_TABLET | Freq: Every day | ORAL | Status: DC
  Administered 2021-07-21: 10 mg via ORAL
  Filled 2021-07-16: qty 1

## 2021-07-16 MED ORDER — POLYETHYLENE GLYCOL 3350 17 G PO PACK: 17.0000 g | PACK | Freq: Two times a day (BID) | ORAL | Status: DC

## 2021-07-16 MED ORDER — COMPLETENATE 29-1 MG PO CHEW
1.0000 | CHEWABLE_TABLET | Freq: Every day | ORAL | Status: DC
  Filled 2021-07-16: qty 1

## 2021-07-16 MED ORDER — THIAMINE HCL 100 MG PO TABS
100.0000 mg | ORAL_TABLET | Freq: Every day | ORAL | Status: DC
Start: 2021-07-17 — End: 2021-07-24
  Administered 2021-07-17 – 2021-07-24 (×8): 100 mg via ORAL
  Filled 2021-07-16 (×11): qty 1

## 2021-07-16 MED ORDER — LORATADINE 10 MG PO TABS: 10.0000 mg | ORAL_TABLET | Freq: Every day | ORAL | Status: DC

## 2021-07-16 MED ORDER — ONDANSETRON HCL 4 MG/5ML PO SOLN
8.0000 mg | Freq: Three times a day (TID) | ORAL | Status: DC
  Administered 2021-07-16 – 2021-07-18 (×5): 8 mg via ORAL
  Administered 2021-07-19: 4 mg via ORAL
  Filled 2021-07-16 (×9): qty 10

## 2021-07-16 MED ORDER — ONDANSETRON HCL 4 MG/5ML PO SOLN
8.0000 mg | Freq: Three times a day (TID) | ORAL | Status: DC
  Filled 2021-07-16: qty 10

## 2021-07-16 MED ORDER — COMPLETENATE 29-1 MG PO CHEW
1.0000 | CHEWABLE_TABLET | Freq: Every day | ORAL | Status: DC
  Administered 2021-07-17 – 2021-07-21 (×5): 1 via ORAL
  Filled 2021-07-16 (×5): qty 1

## 2021-07-16 NOTE — Progress Notes (Signed)
Patient ID: Krystal [name], female   DOB: 05/30/1990, 31 y.o.   MRN: [id number] [place] COMPREHENSIVE PROGRESS NOTE  Krystal [name] is a 31 y.o. [id number] at [redacted]w[redacted]d who is admitted for uncontrolled HTN, N/V FGR and anxiety/depression. .   Fetal presentation is unsure. Length of Stay:  14  Days  Subjective: Pt reports feeling better today. Only 2 episodes of N/V since feeding tube placed. Tolerating tube feedings and regular diet as well. Still dealing with the recent passing of her father. Patient reports good fetal movement.  She reports no uterine contractions, no bleeding and no loss of fluid per vagina.  Vitals:  Blood pressure 126/79, pulse 82, temperature 97.8 F (36.6 C), temperature source Oral, resp. rate 18, height '5\' 11"'$  (1.803 m), weight 102.6 kg, last menstrual period 01/19/2021, SpO2 99 %, not currently breastfeeding.  Physical Examination: Lungs clear Heart RRR Abd soft + BS gravid Ext non tender  Fetal Monitoring:  Baseline: 150's bpm  Labs:  No results found for this or any previous visit (from the past 24 hour(s)).  Imaging Studies:    NA   Medications:  Scheduled  [START ON 07/17/2021] amLODipine  10 mg Oral Daily   [START ON 07/17/2021] aspirin  162 mg Oral Daily   busPIRone  10 mg Oral TID   clonazePAM  1 mg Oral Q8H   dicyclomine  20 mg Oral TID AC   hydrALAZINE  100 mg Oral Q8H   labetalol  600 mg Oral Q8H   [START ON 07/17/2021] levothyroxine  125 mcg Oral QAC breakfast   [START ON 07/17/2021] loratadine  10 mg Oral Daily   magnesium oxide  400 mg Oral BID   ondansetron  8 mg Oral Q8H   [START ON 07/17/2021] pantoprazole sodium  40 mg Oral Daily   polyethylene glycol  17 g Oral BID   [START ON 07/21/2021] predniSONE  10 mg Oral Q breakfast   [START ON 07/18/2021] predniSONE  20 mg Oral Q breakfast   [START ON 07/17/2021] predniSONE  30 mg Oral Q breakfast   [START ON 07/17/2021] prenatal vitamin w/FE, FA  1 tablet Oral Q1200    sodium chloride flush  3 mL Intravenous Q12H   [START ON 07/17/2021] thiamine  100 mg Oral Daily   venlafaxine XR  225 mg Oral Daily   I have reviewed the patient's current medications.  ASSESSMENT: IUP 25 3/7 weeks CHTN FGR Tet[facility]ogy of Fallot ( [facility] N/V HA Anxiety/depression  PLAN: Stable. Improved with tube feedings. Doppler studies tomorrow.  Continue routine antenatal care.   MChancy Milroy5/23/2023,3:09 PM

## 2021-07-16 NOTE — Progress Notes (Signed)
Nutrition Follow-up : tube feeding  Feeding tube placed yesterday afternoon, placement is distal antrum/pylorus.  Jevity 1.2 initiated at 2200 at 20 ml/hr, now at 30 ml/hr. Advancing by 10 ml q 8 hours to a goal of 83 ml/hr.  30 ml water flushes q 4 hours.  Pt has tol initiation of TF well. Vomited x 1 last night per her report. She was eating breakfast when I visited this AM. She is able to consume soft bland foods and beverages. I encouraged her to continue this. If oral intake improves may not need deliver goal TF, vol could be reduced.  If oral intake of fluids continues may be able to reduce IVF to 25 ml/hr.   Pt encouraged with progress this morning and feels like she has a bit more energy  Please obtain CMP for next 3 days to monitor electrolytes. Please continue Thiamine and prenatal vit supps.  Weyman Rodney M.Fredderick Severance LDN Neonatal Nutrition Support Specialist/RD III

## 2021-07-16 NOTE — Progress Notes (Signed)
Follow up visit with Krystal [name]. Chaplain asked open ended questions to facilitate emotional expression and story telling. Krystal [name] shared about her father's death and her grief over learning that her mother had gone ahead with the funeral without notifying any of her children living in the [place]. Chaplain provided reflective listening while Krystal [name] explored her feelings about her complicated family dynamics.   Krystal [name] reports feeling a little more energy today, but states she is still physically exhausted. She attributes this to her anxiety medication. Chaplain normalized the adjustment period, but also invited pt to consider how much she has been through phasically and emotionally and the toll that might have taken regardless of the new medication.  Please page as further needs arise.  Donald Prose. Elyn Peers, M.Div. [facility] Chaplain Pager [phone] Office [phone]

## 2021-07-17 ENCOUNTER — Inpatient Hospital Stay (HOSPITAL_COMMUNITY): Payer: Commercial Managed Care - HMO

## 2021-07-17 ENCOUNTER — Inpatient Hospital Stay (HOSPITAL_BASED_OUTPATIENT_CLINIC_OR_DEPARTMENT_OTHER): Payer: Commercial Managed Care - HMO

## 2021-07-17 ENCOUNTER — Ambulatory Visit: Payer: Commercial Managed Care - HMO

## 2021-07-17 DIAGNOSIS — O365920 Maternal care for other known or suspected poor fetal growth, second trimester, not applicable or unspecified: Secondary | ICD-10-CM | POA: Diagnosis not present

## 2021-07-17 DIAGNOSIS — R112 Nausea with vomiting, unspecified: Secondary | ICD-10-CM | POA: Diagnosis not present

## 2021-07-17 DIAGNOSIS — O10012 Pre-existing essential hypertension complicating pregnancy, second trimester: Secondary | ICD-10-CM | POA: Diagnosis not present

## 2021-07-17 DIAGNOSIS — Z3A25 25 weeks gestation of pregnancy: Secondary | ICD-10-CM

## 2021-07-17 DIAGNOSIS — O1422 HELLP syndrome (HELLP), second trimester: Secondary | ICD-10-CM | POA: Diagnosis not present

## 2021-07-17 LAB — COMPREHENSIVE METABOLIC PANEL
ALT: 23 U/L (ref 0–44)
AST: 20 U/L (ref 15–41)
Albumin: 2.9 g/dL — ABNORMAL LOW (ref 3.5–5.0)
Alkaline Phosphatase: 52 U/L (ref 38–126)
Anion gap: 7 (ref 5–15)
BUN: 16 mg/dL (ref 6–20)
CO2: 24 mmol/L (ref 22–32)
Calcium: 9.2 mg/dL (ref 8.9–10.3)
Chloride: 105 mmol/L (ref 98–111)
Creatinine, Ser: 0.87 mg/dL (ref 0.44–1.00)
GFR, Estimated: >60 mL/min (ref >60)
Glucose, Bld: 96 mg/dL (ref 70–99)
Potassium: 3.7 mmol/L (ref 3.5–5.1)
Sodium: 136 mmol/L (ref 135–145)
Total Bilirubin: 0.3 mg/dL (ref 0.3–1.2)
Total Protein: 5.1 g/dL — ABNORMAL LOW (ref 6.5–8.1)

## 2021-07-17 LAB — CBC
HCT: 29.8 % — ABNORMAL LOW (ref 36.0–46.0)
Hemoglobin: 10.6 g/dL — ABNORMAL LOW (ref 12.0–15.0)
MCH: 33.7 pg (ref 26.0–34.0)
MCHC: 35.6 g/dL (ref 30.0–36.0)
MCV: 94.6 fL (ref 80.0–100.0)
Platelets: 172 10*3/uL (ref 150–400)
RBC: 3.15 MIL/uL — ABNORMAL LOW (ref 3.87–5.11)
RDW: 12.4 % (ref 11.5–15.5)
WBC: 12.1 10*3/uL — ABNORMAL HIGH (ref 4.0–10.5)
nRBC: 0 % (ref 0.0–0.2)

## 2021-07-17 NOTE — Care Management Note (Signed)
Case Management Note  Patient Details  Name: Krystal [name] MRN: [id number] Date of Birth: 05/31/1990  Subjective/Objective:                  [name] is a 30 y.o. G5P0222 at [redacted]w[redacted]d  who is admitted for uncontrolled HTN, N/V FGR and anxiety/depression.  Action/Plan:   In-House Referral:  Clinical Social Work, Nutrition  Discharge planning Services  CM Consult   Choice offered to:  Patient  HH Arranged:  RN HH Agency:  [facility]- tube feeding formula and supplies; [facility]- for Home Health Nursing  Additional Comments: Consult for home tube feedings. CM met with patient in room and offered choice. CM did not have preference. CM explained that she will submit to insurance and make sure that is in insurance plan. CM reached out to [employer] with [employer] and started process for case.  [facility] and [facility] in plan.  Case Manager reached out to Pam for home tube feeding supplies and she will follow patient. [employer] will reach out to [facility] for Home Health nursing.  Patient's current tube is not in, came out last night.  CM spoke to MD on unit and order has been placed to replace tube. Patient shared with MD and patient that she felt better during the time time.  Patient lives with husband and 2 other daughters at home and demographics correct.  Patient verbalized understanding regarding home health.  Patient's PCP is Dr. Bakare. Patient has no barrier to transportation and husband drives and she drives she shared with CM.   CM received message from Pam with [facility] that patient is covered 100%.    B.  RNC-MNN, BSN Transitions of Care Pediatric[facility]  ,  [place], RN 07/17/2021, 12:05 PM  

## 2021-07-17 NOTE — Procedures (Signed)
Cortrak  Person Inserting Tube:  Krystal [name], RD Tube Type:  Cortrak - 43 inches Tube Size:  10 Tube Location:  Left nare Secured by: Bridle Technique Used to Measure Tube Placement:  Marking at nare/corner of mouth Cortrak Secured At:  89 cm  Cortrak Tube Team Note:  Consult received to place a Cortrak feeding tube.   X-ray is required, abdominal x-ray has been ordered by the Cortrak team. Please confirm tube placement before using the Cortrak tube.   If the tube becomes dislodged please keep the tube and contact the Cortrak team at [email or web address] (password [email or web address]) for replacement.  If after hours and replacement cannot be delayed, place a NG tube and confirm placement with an abdominal x-ray.   Lockie Pares., RD, LDN, CNSC See AMiON for contact information

## 2021-07-17 NOTE — Progress Notes (Signed)
Patient ID: NOELI LAVERY, female   DOB: 21-Mar-1990, 31 y.o.   MRN: [id number] [place] COMPREHENSIVE PROGRESS NOTE  Tenley Winward Brabender is a 32 y.o. [id number] at [redacted]w[redacted]d who is admitted for uncontrolled HTN, N/V. FGR and anxiety/depression.   Fetal presentation is unsure. Length of Stay:  15  Days  Subjective: Pt not feeling as well today. Feeding tube came out later yesterday afternoon, Pt requests to have replaced Patient reports good fetal movement.  She reports no uterine contractions, no bleeding and no loss of fluid per vagina.  Vitals:  Blood pressure (!) 145/90, pulse 87, temperature 97.9 F (36.6 C), temperature source Oral, resp. rate 16, height '5\' 11"'$  (1.803 m), weight 104.1 kg, last menstrual period 01/19/2021, SpO2 99 %, not currently breastfeeding.  Physical Examination: Lungs clear Heart RRR Abd soft + BS gravid Ext non tedner  Fetal Monitoring:  Baseline: 150's bpm  Labs:  Results for orders placed or performed during the hospital encounter of 07/02/21 (from the past 24 hour(s))  CBC   Collection Time: 07/17/21  5:09 AM  Result Value Ref Range   WBC 12.1 (H) 4.0 - 10.5 K/uL   RBC 3.15 (L) 3.87 - 5.11 MIL/uL   Hemoglobin 10.6 (L) 12.0 - 15.0 g/dL   HCT 29.8 (L) 36.0 - 46.0 %   MCV 94.6 80.0 - 100.0 fL   MCH 33.7 26.0 - 34.0 pg   MCHC 35.6 30.0 - 36.0 g/dL   RDW 12.4 11.5 - 15.5 %   Platelets 172 150 - 400 K/uL   nRBC 0.0 0.0 - 0.2 %  Comprehensive metabolic panel   Collection Time: 07/17/21  5:09 AM  Result Value Ref Range   Sodium 136 135 - 145 mmol/L   Potassium 3.7 3.5 - 5.1 mmol/L   Chloride 105 98 - 111 mmol/L   CO2 24 22 - 32 mmol/L   Glucose, Bld 96 70 - 99 mg/dL   BUN 16 6 - 20 mg/dL   Creatinine, Ser 0.87 0.44 - 1.00 mg/dL   Calcium 9.2 8.9 - 10.3 mg/dL   Total Protein 5.1 (L) 6.5 - 8.1 g/dL   Albumin 2.9 (L) 3.5 - 5.0 g/dL   AST 20 15 - 41 U/L   ALT 23 0 - 44 U/L   Alkaline Phosphatase 52 38 - 126 U/L   Total Bilirubin  0.3 0.3 - 1.2 mg/dL   GFR, Estimated >60 >60 mL/min   Anion gap 7 5 - 15    Imaging Studies:    Doppler studies today   Medications:  Scheduled  amLODipine  10 mg Oral Daily   aspirin  162 mg Oral Daily   busPIRone  10 mg Oral TID   clonazePAM  1 mg Oral Q8H   dicyclomine  20 mg Oral TID AC   hydrALAZINE  100 mg Oral Q8H   labetalol  600 mg Oral Q8H   levothyroxine  125 mcg Oral QAC breakfast   loratadine  10 mg Oral Daily   magnesium oxide  400 mg Oral BID   ondansetron  8 mg Oral Q8H   pantoprazole sodium  40 mg Oral Daily   polyethylene glycol  17 g Oral BID   [START ON 07/21/2021] predniSONE  10 mg Oral Q breakfast   [START ON 07/18/2021] predniSONE  20 mg Oral Q breakfast   prenatal vitamin w/FE, FA  1 tablet Oral Q1200   sodium chloride flush  3 mL Intravenous Q12H   thiamine  100 mg Oral Daily   venlafaxine XR  225 mg Oral Daily   I have reviewed the patient's current medications.  ASSESSMENT: IUP 25 4/7 weeks CHTN FGR Tetralogy of Fallot ( OK to deliver at [facility]) N/V HA Anxiety/depression  PLAN: Step back due to feeding tube coming out. Had previously tolerating tube feedings, tolerating diet better and much less N/V. Spirits were better as well. Will have feedig tube replaced today Doppler studies today. Arranging for possible discharge home on Friday before long holiday week end if possible.  Continue routine antenatal care.   Chancy Milroy 07/17/2021,3:45 PM

## 2021-07-17 NOTE — Progress Notes (Signed)
Follow up visit with Elder Negus, who reports today has been a hard day. She is experiencing more physical and emotional pain today. Chaplain facilitated communication with pt's RN to coordinate intervention to support pt being able to take her medication and get her daily monitoring complete. Chaplain asked open ended questions to facilitate story telling and emotional expression and provided reflective listening and reframing. Pt is in the beginning stages of grief over her father's death. She shared her husband's surprise that his death was affecting her as much as it is because they were not close and chaplain normalized that grief can still be significant even when day to day living is not impacted by death as the patient is now coming to terms with the realization that unfinished business of the relationship will never be full resolved or restored.  Please page as further needs arise.  Donald Prose. Elyn Peers, M.Div. [facility] Chaplain Pager [phone] Office [phone]

## 2021-07-17 NOTE — Care Management (Signed)
Consult for home health tube feedings. CM submitted request to [facility] [phone] and CM spoke to [place] Coordinator phone # [phone] opt 7/ ext [phone].  CM faxed to [facility] as requested H/P orders, and demographics to fax [phone].   Rosita Fire RNC-MNN, BSN Transitions of Care Pediatrics/Women's and [place]

## 2021-07-18 DIAGNOSIS — O1422 HELLP syndrome (HELLP), second trimester: Secondary | ICD-10-CM | POA: Diagnosis not present

## 2021-07-18 DIAGNOSIS — R112 Nausea with vomiting, unspecified: Secondary | ICD-10-CM | POA: Diagnosis not present

## 2021-07-18 DIAGNOSIS — Z3A25 25 weeks gestation of pregnancy: Secondary | ICD-10-CM | POA: Diagnosis not present

## 2021-07-18 LAB — COMPREHENSIVE METABOLIC PANEL
ALT: 19 U/L (ref 0–44)
AST: 16 U/L (ref 15–41)
Albumin: 2.7 g/dL — ABNORMAL LOW (ref 3.5–5.0)
Alkaline Phosphatase: 53 U/L (ref 38–126)
Anion gap: 8 (ref 5–15)
BUN: 11 mg/dL (ref 6–20)
CO2: 23 mmol/L (ref 22–32)
Calcium: 8.9 mg/dL (ref 8.9–10.3)
Chloride: 106 mmol/L (ref 98–111)
Creatinine, Ser: 0.76 mg/dL (ref 0.44–1.00)
GFR, Estimated: >60 mL/min (ref >60)
Glucose, Bld: 96 mg/dL (ref 70–99)
Potassium: 3.6 mmol/L (ref 3.5–5.1)
Sodium: 137 mmol/L (ref 135–145)
Total Bilirubin: 0.5 mg/dL (ref 0.3–1.2)
Total Protein: 4.9 g/dL — ABNORMAL LOW (ref 6.5–8.1)

## 2021-07-18 MED ORDER — LORAZEPAM 2 MG/ML IJ SOLN
2.0000 mg | Freq: Once | INTRAMUSCULAR | Status: AC
  Administered 2021-07-18: 2 mg via INTRAVENOUS
  Filled 2021-07-18: qty 1

## 2021-07-18 MED ORDER — PROMETHAZINE HCL 25 MG PO TABS: 25.0000 mg | ORAL_TABLET | Freq: Once | ORAL | Status: DC

## 2021-07-18 MED ORDER — SODIUM CHLORIDE 0.9 % IV SOLN
25.0000 mg | Freq: Once | INTRAVENOUS | Status: AC
  Administered 2021-07-18: 25 mg via INTRAVENOUS
  Filled 2021-07-18: qty 1

## 2021-07-18 MED ORDER — DIPHENHYDRAMINE HCL 50 MG/ML IJ SOLN
25.0000 mg | Freq: Once | INTRAMUSCULAR | Status: AC
  Administered 2021-07-18: 25 mg via INTRAVENOUS
  Filled 2021-07-18: qty 1

## 2021-07-18 NOTE — Progress Notes (Signed)
Follow up visit with [place] who reports she continues to be overwhelmed by grief. Pt's spouse and children have not been able to visit and the significant amount of time alone has left pt with little to do other than think about the things that overwhelm her. Chaplain continues to provide emotional support and reflective listening. Chaplain also made recommendation for companion sitter.  Please page as further needs arise.  Donald Prose. Elyn Peers, M.Div. [facility] Chaplain Pager [phone] Office [phone]

## 2021-07-18 NOTE — TOC Progression Note (Signed)
Transition of Care [facility]) - Progression Note    Patient Details  Name: CIEL CHERVENAK MRN: [id number] Date of Birth: [birth year]  Transition of Care [facility]) CM/SW [place], RN Phone Number: 07/18/2021, 2:36 PM  Clinical Narrative:     [employer] CM called [phone] option [phone] extension [phone]. [facility] is looking at a Tuesday start. She will call back Monday, does not see any issue with authorization, however we are still awaiting auth.        Expected Discharge Plan and Services   In-house Referral: Clinical Social Work, Nutrition Discharge Planning Services: CM Consult                               HH Arranged: RN [place] Agency: [facility])         Social Determinants of Health (SDOH) Interventions    Readmission Risk Interventions     View : No data to display.

## 2021-07-18 NOTE — Progress Notes (Signed)
Nutrition Follow-up : tube feeding  Feeding tube replaced yesterday afternoon, placement is gastric antrum/pylorus.  Jevity 1.2 at 30 ml/hr. To increase to 40 ml at 10 AM Advancing by 10 ml q 8 hours to a goal of 83 ml/hr.  30 ml water flushes q 4 hours. Pt prefers the current slow advancement of TF . Pt has tol TF well.   Given that pt is successful consuming some oral food/beverage would adjust goal TF rate to 70 ml/hr This provides 2016 Kcal, 93 g protein and 1377 ml free water.   Once TF reaches 50 ml/hr consider reducing  IVF to 50 ml/hr.     Please continue Thiamine and prenatal vit supps.  Weyman Rodney M.Fredderick Severance LDN Neonatal Nutrition Support Specialist/RD III

## 2021-07-18 NOTE — Progress Notes (Signed)
Pt vomited NG tube out. Tube feeding stopped at this time. Dr. Rip Harbour made aware.

## 2021-07-18 NOTE — Progress Notes (Signed)
Patient ID: ISMERAI BIN, female   DOB: February 12, 1991, 31 y.o.   MRN: [id number] [place] COMPREHENSIVE PROGRESS NOTE   Gerlean Cid Idris is a 31 y.o. [id number] at [redacted]w[redacted]d who is admitted for uncontrolled HTN, N/V. FGR and anxiety/depression.   Fetal presentation is unsure. Length of Stay:  15  Days   Subjective: Pt  feeling some better today. Feeding tube replaced yesterday. Tolerating feedings, thus far. Patient reports good fetal movement.  She reports no uterine contractions, no bleeding and no loss of fluid per vagina.   Vitals:  Blood pressure (!) 145/90, pulse 87, temperature 97.9 F (36.6 C), temperature source Oral, resp. rate 16, height '5\' 11"'$  (1.803 m), weight 104.1 kg, last menstrual period 01/19/2021, SpO2 99 %, not currently breastfeeding.   Physical Examination: Lungs clear     Heart RRR Abd soft + BS gravid Ext non tedner   Fetal Monitoring:  Baseline: 150's bpm   Labs:       Results for orders placed or performed during the hospital encounter of 07/02/21 (from the past 24 hour(s))  CBC    Collection Time: 07/17/21  5:09 AM  Result Value Ref Range    WBC 12.1 (H) 4.0 - 10.5 K/uL    RBC 3.15 (L) 3.87 - 5.11 MIL/uL    Hemoglobin 10.6 (L) 12.0 - 15.0 g/dL    HCT 29.8 (L) 36.0 - 46.0 %    MCV 94.6 80.0 - 100.0 fL    MCH 33.7 26.0 - 34.0 pg    MCHC 35.6 30.0 - 36.0 g/dL    RDW 12.4 11.5 - 15.5 %    Platelets 172 150 - 400 K/uL    nRBC 0.0 0.0 - 0.2 %  Comprehensive metabolic panel    Collection Time: 07/17/21  5:09 AM  Result Value Ref Range    Sodium 136 135 - 145 mmol/L    Potassium 3.7 3.5 - 5.1 mmol/L    Chloride 105 98 - 111 mmol/L    CO2 24 22 - 32 mmol/L    Glucose, Bld 96 70 - 99 mg/dL    BUN 16 6 - 20 mg/dL    Creatinine, Ser 0.87 0.44 - 1.00 mg/dL    Calcium 9.2 8.9 - 10.3 mg/dL    Total Protein 5.1 (L) 6.5 - 8.1 g/dL    Albumin 2.9 (L) 3.5 - 5.0 g/dL    AST 20 15 - 41 U/L    ALT 23 0 - 44 U/L    Alkaline Phosphatase 52 38 -  126 U/L    Total Bilirubin 0.3 0.3 - 1.2 mg/dL    GFR, Estimated >60 >60 mL/min    Anion gap 7 5 - 15      Imaging Studies:    Doppler studies yesterday, normal   Medications:  Scheduled  amLODipine  10 mg Oral Daily   aspirin  162 mg Oral Daily   busPIRone  10 mg Oral TID   clonazePAM  1 mg Oral Q8H   dicyclomine  20 mg Oral TID AC   hydrALAZINE  100 mg Oral Q8H   labetalol  600 mg Oral Q8H   levothyroxine  125 mcg Oral QAC breakfast   loratadine  10 mg Oral Daily   magnesium oxide  400 mg Oral BID   ondansetron  8 mg Oral Q8H   pantoprazole sodium  40 mg Oral Daily   polyethylene glycol  17 g Oral BID   [START ON 07/21/2021] predniSONE  10 mg Oral Q breakfast   [START ON 07/18/2021] predniSONE  20 mg Oral Q breakfast   prenatal vitamin w/FE, FA  1 tablet Oral Q1200   sodium chloride flush  3 mL Intravenous Q12H   thiamine  100 mg Oral Daily   venlafaxine XR  225 mg Oral Daily    I have reviewed the patient's current medications.   ASSESSMENT: IUP 33 5/7 weeks CHTN FGR Tetralogy of Fallot ( OK to deliver at [facility]) N/V HA Anxiety/depression   PLAN: Slight improvement today. Continue with tube feedings. HH has been arranged and pt request discharge home next Tuesday if continues to do well and remain stable. Continue routine antenatal care.     Chancy Milroy

## 2021-07-18 NOTE — Care Management (Signed)
CM received call from [place] Coordinator at [facility] # [phone] opt 7/ext [phone] and she informed CM that [facility] has accepted patient for [facility] nursing.  Rosita Fire RNC-MNN, BSN Transitions of Care Pediatrics/Women's and [place]

## 2021-07-18 NOTE — Care Management (Signed)
CM spoke to MD and tentative plan is to dc home on Tuesday with [facility] with tube and nursing. CM spoke to Carolynn Sayers with [facility] and they will supply feeds to the home and [facility] with nursing.  Pam will follow and see patient while in patient. Still awaiting auth.    Rosita Fire RNC-MNN, BSN Transitions of Care Pediatrics/Women's and [place]

## 2021-07-19 ENCOUNTER — Inpatient Hospital Stay (HOSPITAL_COMMUNITY): Payer: Commercial Managed Care - HMO

## 2021-07-19 DIAGNOSIS — Z3A25 25 weeks gestation of pregnancy: Secondary | ICD-10-CM | POA: Diagnosis not present

## 2021-07-19 DIAGNOSIS — O1422 HELLP syndrome (HELLP), second trimester: Secondary | ICD-10-CM | POA: Diagnosis not present

## 2021-07-19 DIAGNOSIS — R112 Nausea with vomiting, unspecified: Secondary | ICD-10-CM | POA: Diagnosis not present

## 2021-07-19 LAB — COMPREHENSIVE METABOLIC PANEL
ALT: 18 U/L (ref 0–44)
AST: 15 U/L (ref 15–41)
Albumin: 2.7 g/dL — ABNORMAL LOW (ref 3.5–5.0)
Alkaline Phosphatase: 52 U/L (ref 38–126)
Anion gap: 8 (ref 5–15)
BUN: 13 mg/dL (ref 6–20)
CO2: 23 mmol/L (ref 22–32)
Calcium: 8.7 mg/dL — ABNORMAL LOW (ref 8.9–10.3)
Chloride: 104 mmol/L (ref 98–111)
Creatinine, Ser: 0.8 mg/dL (ref 0.44–1.00)
GFR, Estimated: >60 mL/min (ref >60)
Glucose, Bld: 81 mg/dL (ref 70–99)
Potassium: 3.8 mmol/L (ref 3.5–5.1)
Sodium: 135 mmol/L (ref 135–145)
Total Bilirubin: 0.3 mg/dL (ref 0.3–1.2)
Total Protein: 4.8 g/dL — ABNORMAL LOW (ref 6.5–8.1)

## 2021-07-19 MED ORDER — OLANZAPINE 2.5 MG PO TABS
2.5000 mg | ORAL_TABLET | Freq: Three times a day (TID) | ORAL | Status: DC | PRN
  Administered 2021-07-19: 2.5 mg via ORAL
  Filled 2021-07-19 (×3): qty 1

## 2021-07-19 MED ORDER — ONDANSETRON HCL 4 MG/5ML PO SOLN
8.0000 mg | Freq: Three times a day (TID) | ORAL | Status: DC
  Administered 2021-07-19: 8 mg via ORAL
  Filled 2021-07-19: qty 10

## 2021-07-19 MED ORDER — BUSPIRONE HCL 15 MG PO TABS
7.5000 mg | ORAL_TABLET | Freq: Three times a day (TID) | ORAL | Status: DC
  Administered 2021-07-19 (×2): 7.5 mg via ORAL
  Filled 2021-07-19 (×3): qty 1

## 2021-07-19 MED ORDER — LORAZEPAM 1 MG PO TABS
2.0000 mg | ORAL_TABLET | Freq: Four times a day (QID) | ORAL | Status: DC | PRN
  Administered 2021-07-21 (×2): 2 mg via ORAL
  Filled 2021-07-19 (×3): qty 2

## 2021-07-19 MED ORDER — CLONAZEPAM 0.5 MG PO TBDP
1.0000 mg | ORAL_TABLET | Freq: Two times a day (BID) | ORAL | Status: DC
  Administered 2021-07-19 – 2021-07-21 (×4): 1 mg via ORAL
  Filled 2021-07-19 (×4): qty 2

## 2021-07-19 MED ORDER — ONDANSETRON 4 MG PO TBDP
8.0000 mg | ORAL_TABLET | Freq: Three times a day (TID) | ORAL | Status: DC
  Administered 2021-07-19 – 2021-07-24 (×15): 8 mg via ORAL
  Filled 2021-07-19 (×15): qty 2

## 2021-07-19 NOTE — Procedures (Signed)
Cortrak  Person Inserting Tube:  Esaw Dace, RD Tube Type:  Cortrak - 43 inches Tube Size:  10 Tube Location:  Right nare Secured by: Bridle Technique Used to Measure Tube Placement:  Marking at nare/corner of mouth Cortrak Secured At:  96 cm Procedure Comments:  Cortrak Tube Team Note:  Consult received to place a Cortrak feeding tube.   X-ray is required, abdominal x-ray has been ordered by the Cortrak team. Please confirm tube placement before using the Cortrak tube.   If the tube becomes dislodged please keep the tube and contact the Cortrak team at [email or web address] (password [email or web address]) for replacement.  If after hours and replacement cannot be delayed, place a NG tube and confirm placement with an abdominal x-ray.    Kerman Passey MS, RDN, LDN, CNSC Registered Dietitian III Clinical Nutrition RD Pager and On-Call Pager Number Located in [place]

## 2021-07-19 NOTE — Progress Notes (Signed)
Patient ID: Krystal [name], female   DOB: [birth year], 31 y.o.   MRN: [id number] [place] COMPREHENSIVE PROGRESS NOTE  Krystal [name] is a 31 y.o. [id number] at [redacted]w[redacted]d who is admitted for uncontrolled HTN, chronic N/V, FGR and anxietydepression.   Fetal presentation is unsure. Length of Stay:  17  Days  Subjective: Pt a little better today. Feeding come out late yesterday follow episode of N/V.  Patient reports good fetal movement.  She reports no uterine contractions, no bleeding and no loss of fluid per vagina.  Vitals:  Blood pressure (!) 142/86, pulse 80, temperature 97.8 F (36.6 C), temperature source Oral, resp. rate 20, height '5\' 11"'$  (1.803 m), weight 107.5 kg, last menstrual period 01/19/2021, SpO2 100 %, not currently breastfeeding.  Physical Examination: Lungs clear Heart RRR Abd soft + BS gravid Ext non tender  Fetal Monitoring:  Baseline: 150's bpm  Labs:  Results for orders placed or performed during the hospital encounter of 07/02/21 (from the past 24 hour(s))  Comprehensive metabolic panel   Collection Time: 07/19/21  5:23 AM  Result Value Ref Range   Sodium 135 135 - 145 mmol/L   Potassium 3.8 3.5 - 5.1 mmol/L   Chloride 104 98 - 111 mmol/L   CO2 23 22 - 32 mmol/L   Glucose, Bld 81 70 - 99 mg/dL   BUN 13 6 - 20 mg/dL   Creatinine, Ser 0.80 0.44 - 1.00 mg/dL   Calcium 8.7 (L) 8.9 - 10.3 mg/dL   Total Protein 4.8 (L) 6.5 - 8.1 g/dL   Albumin 2.7 (L) 3.5 - 5.0 g/dL   AST 15 15 - 41 U/L   ALT 18 0 - 44 U/L   Alkaline Phosphatase 52 38 - 126 U/L   Total Bilirubin 0.3 0.3 - 1.2 mg/dL   GFR, Estimated >60 >60 mL/min   Anion gap 8 5 - 15    Imaging Studies:    NA   Medications:  Scheduled  amLODipine  10 mg Oral Daily   aspirin  162 mg Oral Daily   busPIRone  10 mg Oral TID   clonazePAM  1 mg Oral Q8H   dicyclomine  20 mg Oral TID AC   hydrALAZINE  100 mg Oral Q8H   labetalol  600 mg Oral Q8H   levothyroxine  125 mcg Oral QAC  breakfast   loratadine  10 mg Oral Daily   magnesium oxide  400 mg Oral BID   ondansetron  8 mg Oral Q8H   pantoprazole sodium  40 mg Oral Daily   polyethylene glycol  17 g Oral BID   [START ON 07/21/2021] predniSONE  10 mg Oral Q breakfast   predniSONE  20 mg Oral Q breakfast   prenatal vitamin w/FE, FA  1 tablet Oral Q1200   sodium chloride flush  3 mL Intravenous Q12H   thiamine  100 mg Oral Daily   venlafaxine XR  225 mg Oral Daily   I have reviewed the patient's current medications.  ASSESSMENT: IUP 25 6/7 weeks CHTN FGR [facility] N[facility] Anxiety/depression  PLAN: Discussed replacing feeding tube. Pt would like to try one more time. Diet reviewed with pt. Advised against excessive food and liquid intake. Will change to soft diet as per recommendation of nutrition. Have also discussed with them about replacement today. BP stable. Pt would like to have psych to come back by. I spoke to FNP, PHenrene Pastorand  order  placed. Still striving for discharge next Tuesday. HH is already in place. Continue routine antenatal care.   Krystal [name] 07/19/2021,10:41 AM

## 2021-07-19 NOTE — Consult Note (Signed)
[facility] Followup Face-to-Face Psychiatric Evaluation   Service Date: Jul 19, 2021 LOS:  LOS: 17 days    Assessment  Krystal [name] is a 31 y.o. female admitted medically for 07/02/2021  1:45 PM for  uncontrolled HTN, N/V in pregnancy complicated by tetrology of fallot. She carries the psychiatric diagnoses of depression and anxiety and has a past medical history of asthma, IC, kidney stones, migraines, necrotizing fasciitis, HTN, - prior pregnancies complicated by HELLP and preterm delivery. Psychiatry was reconsulted for same by Dr. Rip Harbour.   Team familiar with this pt - depression and anxiety complicated by pregnancy and hyperemesis; fairly complicated social situation detailed elsewhere.    This has been a challenging case - in perinatal psychiatry the goal is to fully treat the patient's symptoms with the least # of medications to minimize effects on the fetus.  Unfortunately, pt's symptoms are not getting better on fairly high doses of multiple medications - at this point have some concern that escalating BZD doses are disinhibiting pt more so than they are treating anxiety. Goals for this consult include 1) streamlining medication regimen 2) converting to PO medications to facilitate eventual dc home 3) improve symptoms. Patient through course of interview disclosed that venlafaxine had previously been fairly effective; dose increased ~2 weeks ago, that clonazepam and lorazepam have minimal to no effect on her anxiety, and that the most effective medication she is taking is benadryl. Has not noticed much of an effect from buspirone. Spent bulk of consult on brief supportive therapy and trying to reframe pt's cognitive distortions (particularly prone to catastrophizing).  Discussed pt's options for medication (including de-prescribing ineffective medication) and r/b/se of these - pt consented to trialling olanzapine for anxiety and N/V with other changes as detailed below.  Briefly discussed meds below with Dr. Rip Harbour of OB.     Diagnoses:  Active Hospital problems: Principal Problem:   Intractable nausea and vomiting Active Problems:   Anxiety   History of HELLP syndrome, currently pregnant   Posttraumatic stress disorder   Essential hypertension   MDD (major depressive disorder), recurrent episode, severe (HCC)   History of preterm delivery, currently pregnant   Attention deficit disorder   IUGR, antenatal   Anisocoria   Chronic hypertension with exacerbation during pregnancy in second trimester   Poor fetal growth affecting management of mother in second trimester   Headache in pregnancy, antepartum, second trimester   [redacted] weeks gestation of pregnancy     Plan  ## Safety and Observation Level:  - Based on my clinical evaluation, I estimate the patient to be at low risk of self harm in the current setting - At this time, we recommend a routine level of observation - chaplain had recommended companion sitter which    ## Medications: Today:   -- CONTINUE venlafaxine 225 mg for depression, anxiety, etc -- DECREASE clonazepam from 1 mg TID to 1 mg BID for anxiety (no further benefit at TID, ?disinhibiting) -- STOP buspirone 10 mg TID (unlikely to be efficacious in pt exposed to high dose BZD, furthering polypharmacy) -- START olanzapine 2.5 mg TID anxiety/nausea/vomiting (similar receptor profile to benadryl)  - started pill as pt dislikes dissolving tablets  On Sunday 5/28 STOP IV lorazepam 2 mg q6h, START PO lorazepam 2 mg q6h (orders pended)  ## Medical Decision Making Capacity:  Not formally assessed  ## Further Work-up:   -- most recent EKG on 5/4 had QtC of 438 -- Pertinent labwork reviewed earlier  this admission includes: TSH wnl  ## Disposition:  -- home - will need outpt counselling and psych f/u  ## Behavioral / Environmental:  -- Please make effort to frame discussions in terms of "best case, most likely case, worst case" as  pt has tendency to focus on worst case scenarios  ##Legal Status   Thank you for this consult request. Recommendations have been communicated to the primary team.  We will continue to follow at this time.   Tryon A Amiri Riechers   New history  Relevant Aspects of Hospital Course:  Admitted on 07/02/2021 for HTN and N/V in pregnancy. Had been seen by psychiatry a few times throughout admission -  5/12: venlafaxine increased and klonopin started at 0.25 mg BID schedud (POC note) 5/13: klonopin increased to 0.5 mg BID, hydroxyzine trialled 5/14: no med changes 5/16: no med changes, psych signed off In interim klonopin was increased to 1 mg TID and buspirone 10 mg TID was started.   Patient Report:  Patient feels isolated and under a lot of stress, especially after the death of her dad.   "Feels like one big betrayal after another, and I feel like my mom doesn't want to talk to me anymore". "My mom won't show up in the way I need her". Per pt, mom is not always emotionally present - feels like she has gotten much less support than sister through her life. Reported that when sister was delivering, mom was present. "However when it comes to me, it would be easier to floss a crocodile's teeth." Patient's from [place].   Would cry for hours. Today is a both good and bad day - complimented pt on ability to see grey areas. The chaplain stopping by does help some.   Patient feels "terrified" about the pregnancy. When patient told mom about baby's TOF and requiring surgery, became clear that pt focuses on bad outcomes rather than keeping balanced view of likely outcomes - unable to come up with best case scenario for her pregnancy or daughter's delivery when prompted.   Patient feels constantly drained from the pregnancy and patient worries about her children x2 (3.5yo and 85moold) at home. Patient misses her children but does not want her children to see her in current state.   Does see spots when  BP is high. Sleep is not very good, with last night especially bad due to anxiety.  Plan on naming baby EBenjamine Mola Sometimes enjoys crocheting, but sometimes does not have the motivation. IV benadryl helps with anxiety more than IV ativan for anxiety because ativan makes her "feel weird". Reported the klonopin is not very helpful and it keeps coming up - does not like dissolving tablets and feels pills are easier. Reported the venlafaxine has been helpful historically. No changes   Denied SI/HI/AVH.     ROS:  In HPI. Rumination, anxiety, worry most prominent sx.    Psychiatric/social/family/medical/surgical history  See prior consult notes Medications:   Current Facility-Administered Medications:    acetaminophen (TYLENOL) tablet 650 mg, 650 mg, Oral, Q4H PRN, EChancy Milroy MD   albuterol (VENTOLIN HFA) 108 (90 Base) MCG/ACT inhaler 2 puff, 2 puff, Inhalation, Q6H PRN, DRadene Gunning MD   amLODipine (NORVASC) tablet 10 mg, 10 mg, Oral, Daily, EChancy Milroy MD, 10 mg at 07/19/21 00272  aspirin chewable tablet 162 mg, 162 mg, Oral, Daily, EChancy Milroy MD, 162 mg at 07/19/21 0929   busPIRone (BUSPAR) tablet 7.5 mg, 7.5 mg, Oral, TID, NAlfonse Spruce  Almyra Free, DO, 7.5 mg at 07/19/21 1548   calcium carbonate (TUMS - dosed in mg elemental calcium) chewable tablet 400 mg of elemental calcium, 2 tablet, Oral, Q4H PRN, Chancy Milroy, MD   clonazePAM Bobbye [place]) disintegrating tablet 1 mg, 1 mg, Oral, BID, Merrily Brittle, DO   cyclobenzaprine (FLEXERIL) tablet 10 mg, 10 mg, Oral, TID PRN, Chancy Milroy, MD, 10 mg at 07/18/21 2031   dextrose 5 %-0.9 % sodium chloride infusion, , Intravenous, Continuous, Woodroe Mode, MD, Last Rate: 125 mL/hr at 07/19/21 1435, New Bag at 07/19/21 1435   dicyclomine (BENTYL) tablet 20 mg, 20 mg, Oral, TID AC, Chancy Milroy, MD, 20 mg at 07/19/21 1430   diphenhydrAMINE (BENADRYL) injection 25 mg, 25 mg, Intravenous, Q6H PRN, Chancy Milroy, MD, 25 mg at  07/19/21 1432   feeding supplement (JEVITY 1.2 CAL) liquid 1,000 mL, 1,000 mL, Per Tube, Continuous, Constant, Peggy, MD, Last Rate: 83 mL/hr at 07/19/21 1440, 20 mL at 07/19/21 1440   labetalol (NORMODYNE) injection 20 mg, 20 mg, Intravenous, PRN, 20 mg at 07/11/21 0809 **AND** labetalol (NORMODYNE) injection 40 mg, 40 mg, Intravenous, PRN, 40 mg at 07/06/21 1557 **AND** labetalol (NORMODYNE) injection 80 mg, 80 mg, Intravenous, PRN **AND** hydrALAZINE (APRESOLINE) injection 10 mg, 10 mg, Intravenous, PRN **AND** Measure blood pressure, , , Once, Radene Gunning, MD   hydrALAZINE (APRESOLINE) tablet 100 mg, 100 mg, Oral, Q8H, Chancy Milroy, MD, 100 mg at 07/19/21 1430   labetalol (NORMODYNE) tablet 600 mg, 600 mg, Oral, Q8H, Chancy Milroy, MD, 600 mg at 07/19/21 1430   levothyroxine (SYNTHROID) tablet 125 mcg, 125 mcg, Oral, QAC breakfast, Chancy Milroy, MD, 125 mcg at 07/19/21 0536   loratadine (CLARITIN) tablet 10 mg, 10 mg, Oral, Daily, Chancy Milroy, MD, 10 mg at 07/19/21 0930   LORazepam (ATIVAN) injection 2 mg, 2 mg, Intravenous, Q6H PRN, Merrily Brittle, DO, 2 mg at 07/19/21 0921   [START ON 07/21/2021] LORazepam (ATIVAN) tablet 2 mg, 2 mg, Oral, Q6H PRN, Merrily Brittle, DO   magnesium oxide (MAG-OX) tablet 400 mg, 400 mg, Oral, BID, Chancy Milroy, MD, 400 mg at 07/19/21 0929   OLANZapine (ZYPREXA) tablet 2.5 mg, 2.5 mg, Oral, TID PRN, Merrily Brittle, DO, 2.5 mg at 07/19/21 1548   ondansetron (ZOFRAN-ODT) disintegrating tablet 8 mg, 8 mg, Oral, Q8H, 8 mg at 07/19/21 1456 **OR** [DISCONTINUED] ondansetron (ZOFRAN) 4 MG/5ML solution 8 mg, 8 mg, Oral, Q8H, Ozan, Jennifer, DO, 8 mg at 07/19/21 3086   oxyCODONE (Oxy IR/ROXICODONE) immediate release tablet 5 mg, 5 mg, Oral, Q4H PRN, 5 mg at 07/18/21 1813 **OR** oxyCODONE (Oxy IR/ROXICODONE) immediate release tablet 10 mg, 10 mg, Oral, Q4H PRN, Chancy Milroy, MD, 10 mg at 07/19/21 1456   pantoprazole sodium (PROTONIX) 40 mg/20 mL oral  suspension 40 mg, 40 mg, Oral, Daily, Chancy Milroy, MD, 40 mg at 07/19/21 1127   polyethylene glycol (MIRALAX / GLYCOLAX) packet 17 g, 17 g, Oral, BID, Chancy Milroy, MD, 17 g at 07/19/21 0916   [START ON 07/21/2021] predniSONE (DELTASONE) tablet 10 mg, 10 mg, Oral, Q breakfast, Chancy Milroy, MD   predniSONE (DELTASONE) tablet 20 mg, 20 mg, Oral, Q breakfast, Chancy Milroy, MD, 20 mg at 07/19/21 0929   prenatal vitamin w/FE, FA (NATACHEW) chewable tablet 1 tablet, 1 tablet, Oral, Q1200, Chancy Milroy, MD, 1 tablet at 07/19/21 1127   promethazine (PHENERGAN) 25 mg in sodium chloride 0.9 % 50 mL IVPB,  25 mg, Intravenous, Q6H PRN, Radene Gunning, MD, Last Rate: 200 mL/hr at 07/19/21 0912, 25 mg at 07/19/21 0912   promethazine (PHENERGAN) tablet 25 mg, 25 mg, Oral, Q6H PRN, Chancy Milroy, MD   sodium chloride flush (NS) 0.9 % injection 3 mL, 3 mL, Intravenous, Q12H, Eure, Mertie Clause, MD, 3 mL at 07/15/21 1041   sodium chloride flush (NS) 0.9 % injection 3 mL, 3 mL, Intravenous, PRN, Florian Buff, MD   thiamine tablet 100 mg, 100 mg, Oral, Daily, Chancy Milroy, MD, 100 mg at 07/19/21 1009   trimethobenzamide (TIGAN) injection 200 mg, 200 mg, Intramuscular, Q8H PRN, Orma Flaming, MD, 200 mg at 07/17/21 1353   venlafaxine XR (EFFEXOR-XR) 24 hr capsule 225 mg, 225 mg, Oral, Daily, Chancy Milroy, MD, 225 mg at 07/18/21 2303   zolpidem (AMBIEN) tablet 5 mg, 5 mg, Oral, QHS PRN, Chancy Milroy, MD, 5 mg at 07/17/21 0011  Allergies: Allergies  Allergen Reactions   Red Dye Other (See Comments)    ALL CAUSE PROBLEMS #40 IS WORST-MIGRAINES    Compazine [Prochlorperazine Maleate] Other (See Comments)    TWITCHING, CANT STAY STILL. Pt states she can tolerate promethazine   Other Other (See Comments)    FANSIDAR FOR MALARIA-ASTHMA   Vicodin [Hydrocodone-Acetaminophen] Itching, Nausea And Vomiting and Other (See Comments)    Pt states she can tolerate acetaminophen   Amitriptyline  Hcl Other (See Comments)    Angry moods   Duloxetine Hcl Other (See Comments)    Passing out, trimble, irritable   Reglan [Metoclopramide] Other (See Comments)    restless   Nifedipine Nausea And Vomiting and Other (See Comments)    Discussed with patient and maternal fetal medicine, patient has intolerance but not frank allergy.       Objective  Vital signs:  Temp:  [97.8 F (36.6 C)-98.2 F (36.8 C)] 97.8 F (36.6 C) (05/26 0930) Pulse Rate:  [80-90] 80 (05/26 0930) Resp:  [18-20] 20 (05/26 0930) BP: (131-153)/(75-87) 142/86 (05/26 0930) SpO2:  [99 %-100 %] 100 % (05/26 0930) Weight:  [107.5 kg] 107.5 kg (05/26 0500)  Psychiatric Specialty Exam:  Presentation  General Appearance: Appropriate for Environment; Casual; Neat  Eye Contact:Good  Speech:Clear and Coherent; Normal Rate  Speech Volume:Normal  Handedness:Right   Mood and Affect  Mood:Anxious; Dysphoric; Hopeless; Worthless  Affect:Tearful; Full Range   Thought Process  Thought Processes:Coherent; Linear  Descriptions of Associations:Circumstantial  Orientation:Full (Time, Place and Person)  Thought Content:Logical  History of Schizophrenia/Schizoaffective disorder:No  Duration of Psychotic Symptoms:No data recorded Hallucinations:No data recorded Ideas of Reference:None  Suicidal Thoughts:No data recorded Homicidal Thoughts:No data recorded  Sensorium  Memory:Immediate Fair; Recent Fair; Remote Fair  Judgment:Fair  Insight:Fair   Executive Functions  Concentration:Fair  Attention Span:Fair  Glenn   Psychomotor Activity  Psychomotor Activity:No data recorded  Assets  Assets:Communication Skills; Desire for Improvement; Financial Resources/Insurance; Intimacy; Resilience   Sleep  Sleep:No data recorded   Physical Exam: Physical Exam HENT:     Head: Normocephalic.     Nose:     Comments: Coretrak in place  Pulmonary:      Effort: Pulmonary effort is normal.  Neurological:     Mental Status: She is alert and oriented to person, place, and time.  Psychiatric:     Comments: Tearful,     Blood pressure (!) 142/86, pulse 80, temperature 97.8 F (36.6 C), temperature source Oral, resp. rate 20, height 5'  11" (1.803 m), weight 107.5 kg, last menstrual period 01/19/2021, SpO2 100 %, not currently breastfeeding. Body mass index is 33.05 kg/m.

## 2021-07-20 DIAGNOSIS — Z3A26 26 weeks gestation of pregnancy: Secondary | ICD-10-CM

## 2021-07-20 DIAGNOSIS — R112 Nausea with vomiting, unspecified: Secondary | ICD-10-CM | POA: Diagnosis not present

## 2021-07-20 DIAGNOSIS — O1422 HELLP syndrome (HELLP), second trimester: Secondary | ICD-10-CM | POA: Diagnosis not present

## 2021-07-20 NOTE — Progress Notes (Signed)
Patient ID: Krystal [name], female   DOB: 05/13/1990, 31 y.o.   MRN: [id number] [name] COMPREHENSIVE PROGRESS NOTE  Krystal [name] is a 31 y.o. [id number] at [redacted]w[redacted]d who is admitted for uncontrolled HTN, chronic N/V, FGR and anxietydepression.    Fetal presentation is breech. Length of Stay:  18  Days  Subjective: Pt a little better today. Keeping food down.   Patient reports good fetal movement.  She reports no uterine contractions, no bleeding and no loss of fluid per vagina.  Vitals:  Blood pressure 137/85, pulse 92, temperature 97.8 F (36.6 C), temperature source Oral, resp. rate 18, height '5\' 11"'$  (1.803 m), weight 110.2 kg, last menstrual period 01/19/2021, SpO2 100 %, not currently breastfeeding.  Physical Examination: Lungs clear Heart RRR Abd soft + BS gravid Ext non tender  Fetal Monitoring:  Baseline: 150's bpm, one spontaneous decel  Labs:     Latest Ref Rng & Units 07/19/2021    5:23 AM 07/18/2021    5:29 AM 07/17/2021    5:09 AM  CMP  Glucose 70 - 99 mg/dL 81   96   96    BUN 6 - 20 mg/dL '13   11   16    '$ Creatinine 0.44 - 1.00 mg/dL 0.80   0.76   0.87    Sodium 135 - 145 mmol/L 135   137   136    Potassium 3.5 - 5.1 mmol/L 3.8   3.6   3.7    Chloride 98 - 111 mmol/L 104   106   105    CO2 22 - 32 mmol/L '23   23   24    '$ Calcium 8.9 - 10.3 mg/dL 8.7   8.9   9.2    Total Protein 6.5 - 8.1 g/dL 4.8   4.9   5.1    Total Bilirubin 0.3 - 1.2 mg/dL 0.3   0.5   0.3    Alkaline Phos 38 - 126 U/L 52   53   52    AST 15 - 41 U/L '15   16   20    '$ ALT 0 - 44 U/L '18   19   23         '$ Latest Ref Rng & Units 07/17/2021    5:09 AM 07/08/2021    4:12 AM 07/06/2021    5:11 AM  CBC  WBC 4.0 - 10.5 K/uL 12.1   8.9   9.4    Hemoglobin 12.0 - 15.0 g/dL 10.6   10.6   10.7    Hematocrit 36.0 - 46.0 % 29.8   30.8   30.5    Platelets 150 - 400 K/uL 172   181   224       Imaging Studies:    NA   Medications:  Scheduled  amLODipine  10 mg Oral Daily    aspirin  162 mg Oral Daily   busPIRone  7.5 mg Oral TID   clonazePAM  1 mg Oral BID   dicyclomine  20 mg Oral TID AC   hydrALAZINE  100 mg Oral Q8H   labetalol  600 mg Oral Q8H   levothyroxine  125 mcg Oral QAC breakfast   loratadine  10 mg Oral Daily   magnesium oxide  400 mg Oral BID   ondansetron  8 mg Oral Q8H   pantoprazole sodium  40 mg Oral Daily   polyethylene glycol  17 g Oral BID   [START  ON 07/21/2021] predniSONE  10 mg Oral Q breakfast   predniSONE  20 mg Oral Q breakfast   prenatal vitamin w/FE, FA  1 tablet Oral Q1200   sodium chloride flush  3 mL Intravenous Q12H   thiamine  100 mg Oral Daily   venlafaxine XR  225 mg Oral Daily   I have reviewed the patient's current medications.  ASSESSMENT: IUP [redacted]w[redacted]d CHTN FGR [facility] Anxiety/depression  PLAN: Doing well with feeding tube in. Continue diet per nutrition.   Still striving for discharge next Tuesday although potentially Wednesday as she has [facility]. HH is already in place.  BP very well controlled now that keeping medications down.   Psych consult by Dr. CLovette Clichegreatly appreciated. Orders adjusted.   Continue routine antenatal care.   Krystal Gunning5/27/2023,8:27 AM

## 2021-07-21 ENCOUNTER — Inpatient Hospital Stay (HOSPITAL_COMMUNITY): Payer: Commercial Managed Care - HMO | Admitting: Anesthesiology

## 2021-07-21 ENCOUNTER — Other Ambulatory Visit: Payer: Self-pay

## 2021-07-21 ENCOUNTER — Inpatient Hospital Stay (HOSPITAL_COMMUNITY): Payer: Commercial Managed Care - HMO

## 2021-07-21 ENCOUNTER — Encounter (HOSPITAL_COMMUNITY)
Admission: AD | Disposition: A | Discharge status: Home / Self Care | Payer: Self-pay | Source: Home / Self Care | Attending: Obstetrics and Gynecology

## 2021-07-21 DIAGNOSIS — O1002 Pre-existing essential hypertension complicating childbirth: Secondary | ICD-10-CM

## 2021-07-21 DIAGNOSIS — O34211 Maternal care for low transverse scar from previous cesarean delivery: Secondary | ICD-10-CM | POA: Diagnosis not present

## 2021-07-21 DIAGNOSIS — Z98891 History of uterine scar from previous surgery: Secondary | ICD-10-CM

## 2021-07-21 DIAGNOSIS — R112 Nausea with vomiting, unspecified: Secondary | ICD-10-CM | POA: Diagnosis not present

## 2021-07-21 DIAGNOSIS — O99344 Other mental disorders complicating childbirth: Secondary | ICD-10-CM

## 2021-07-21 DIAGNOSIS — O1422 HELLP syndrome (HELLP), second trimester: Secondary | ICD-10-CM | POA: Diagnosis not present

## 2021-07-21 DIAGNOSIS — O321XX0 Maternal care for breech presentation, not applicable or unspecified: Secondary | ICD-10-CM

## 2021-07-21 DIAGNOSIS — O365930 Maternal care for other known or suspected poor fetal growth, third trimester, not applicable or unspecified: Secondary | ICD-10-CM

## 2021-07-21 DIAGNOSIS — O1424 HELLP syndrome, complicating childbirth: Secondary | ICD-10-CM

## 2021-07-21 DIAGNOSIS — Z3A26 26 weeks gestation of pregnancy: Secondary | ICD-10-CM | POA: Diagnosis not present

## 2021-07-21 DIAGNOSIS — Z302 Encounter for sterilization: Secondary | ICD-10-CM

## 2021-07-21 DIAGNOSIS — O218 Other vomiting complicating pregnancy: Secondary | ICD-10-CM

## 2021-07-21 LAB — CBC
HCT: 27.8 % — ABNORMAL LOW (ref 36.0–46.0)
HCT: 29.6 % — ABNORMAL LOW (ref 36.0–46.0)
Hemoglobin: 9.9 g/dL — ABNORMAL LOW (ref 12.0–15.0)
Hemoglobin: 10.4 g/dL — ABNORMAL LOW (ref 12.0–15.0)
MCH: 33.7 pg (ref 26.0–34.0)
MCH: 33 pg (ref 26.0–34.0)
MCHC: 35.6 g/dL (ref 30.0–36.0)
MCHC: 35.1 g/dL (ref 30.0–36.0)
MCV: 94.6 fL (ref 80.0–100.0)
MCV: 94 fL (ref 80.0–100.0)
Platelets: 137 10*3/uL — ABNORMAL LOW (ref 150–400)
Platelets: 64 10*3/uL — ABNORMAL LOW (ref 150–400)
RBC: 2.94 MIL/uL — ABNORMAL LOW (ref 3.87–5.11)
RBC: 3.15 MIL/uL — ABNORMAL LOW (ref 3.87–5.11)
RDW: 12.6 % (ref 11.5–15.5)
RDW: 12.9 % (ref 11.5–15.5)
WBC: 11.6 10*3/uL — ABNORMAL HIGH (ref 4.0–10.5)
WBC: 12.2 10*3/uL — ABNORMAL HIGH (ref 4.0–10.5)
nRBC: 0 % (ref 0.0–0.2)
nRBC: 0.2 % (ref 0.0–0.2)

## 2021-07-21 LAB — COMPREHENSIVE METABOLIC PANEL
ALT: 93 U/L — ABNORMAL HIGH (ref 0–44)
ALT: 366 U/L — ABNORMAL HIGH (ref 0–44)
AST: 98 U/L — ABNORMAL HIGH (ref 15–41)
AST: 590 U/L — ABNORMAL HIGH (ref 15–41)
Albumin: 3 g/dL — ABNORMAL LOW (ref 3.5–5.0)
Albumin: 2.9 g/dL — ABNORMAL LOW (ref 3.5–5.0)
Alkaline Phosphatase: 63 U/L (ref 38–126)
Alkaline Phosphatase: 64 U/L (ref 38–126)
Anion gap: 11 (ref 5–15)
Anion gap: 10 (ref 5–15)
BUN: 10 mg/dL (ref 6–20)
BUN: 8 mg/dL (ref 6–20)
CO2: 20 mmol/L — ABNORMAL LOW (ref 22–32)
CO2: 20 mmol/L — ABNORMAL LOW (ref 22–32)
Calcium: 9.2 mg/dL (ref 8.9–10.3)
Calcium: 8.4 mg/dL — ABNORMAL LOW (ref 8.9–10.3)
Chloride: 106 mmol/L (ref 98–111)
Chloride: 101 mmol/L (ref 98–111)
Creatinine, Ser: 0.66 mg/dL (ref 0.44–1.00)
Creatinine, Ser: 0.74 mg/dL (ref 0.44–1.00)
GFR, Estimated: >60 mL/min (ref >60)
GFR, Estimated: >60 mL/min (ref >60)
Glucose, Bld: 89 mg/dL (ref 70–99)
Glucose, Bld: 92 mg/dL (ref 70–99)
Potassium: 4 mmol/L (ref 3.5–5.1)
Potassium: 4.2 mmol/L (ref 3.5–5.1)
Sodium: 137 mmol/L (ref 135–145)
Sodium: 131 mmol/L — ABNORMAL LOW (ref 135–145)
Total Bilirubin: 0.6 mg/dL (ref 0.3–1.2)
Total Bilirubin: 1.1 mg/dL (ref 0.3–1.2)
Total Protein: 5.5 g/dL — ABNORMAL LOW (ref 6.5–8.1)
Total Protein: 5.3 g/dL — ABNORMAL LOW (ref 6.5–8.1)

## 2021-07-21 LAB — TROPONIN I (HIGH SENSITIVITY)
Troponin I (High Sensitivity): 13 ng/L (ref <18)
Troponin I (High Sensitivity): 14 ng/L (ref <18)

## 2021-07-21 LAB — TYPE AND SCREEN
ABO/RH(D): O POS
Antibody Screen: NEGATIVE

## 2021-07-21 SURGERY — Surgical Case
Anesthesia: General

## 2021-07-21 MED ORDER — FENTANYL CITRATE (PF) 100 MCG/2ML IJ SOLN
INTRAMUSCULAR | Status: DC | PRN
  Administered 2021-07-21: 100 ug via INTRAVENOUS
  Administered 2021-07-21: 50 ug via INTRAVENOUS

## 2021-07-21 MED ORDER — LACTATED RINGERS IV SOLN: INTRAVENOUS | Status: DC | PRN

## 2021-07-21 MED ORDER — POLYETHYLENE GLYCOL 3350 17 G PO PACK
17.0000 g | PACK | Freq: Every day | ORAL | Status: DC
  Administered 2021-07-22 – 2021-07-24 (×3): 17 g via ORAL
  Filled 2021-07-21 (×3): qty 1

## 2021-07-21 MED ORDER — OXYTOCIN-SODIUM CHLORIDE 30-0.9 UT/500ML-% IV SOLN
INTRAVENOUS | Status: AC
  Filled 2021-07-21: qty 500

## 2021-07-21 MED ORDER — LABETALOL HCL 5 MG/ML IV SOLN: 40.0000 mg | INTRAVENOUS | Status: DC | PRN

## 2021-07-21 MED ORDER — PROPOFOL 10 MG/ML IV BOLUS
INTRAVENOUS | Status: AC
  Filled 2021-07-21: qty 20

## 2021-07-21 MED ORDER — ONDANSETRON HCL 4 MG/2ML IJ SOLN
INTRAMUSCULAR | Status: DC | PRN
  Administered 2021-07-21: 4 mg via INTRAVENOUS

## 2021-07-21 MED ORDER — TETANUS-DIPHTH-ACELL PERTUSSIS 5-2.5-18.5 LF-MCG/0.5 IM SUSY: 0.5000 mL | PREFILLED_SYRINGE | Freq: Once | INTRAMUSCULAR | Status: DC

## 2021-07-21 MED ORDER — HYDROMORPHONE HCL 1 MG/ML IJ SOLN
1.0000 mg | Freq: Once | INTRAMUSCULAR | Status: AC
  Administered 2021-07-21: 1 mg via INTRAVENOUS
  Filled 2021-07-21: qty 1

## 2021-07-21 MED ORDER — KETOROLAC TROMETHAMINE 30 MG/ML IJ SOLN
30.0000 mg | Freq: Four times a day (QID) | INTRAMUSCULAR | Status: AC
  Administered 2021-07-21 – 2021-07-22 (×4): 30 mg via INTRAVENOUS
  Filled 2021-07-21 (×4): qty 1

## 2021-07-21 MED ORDER — MAGNESIUM SULFATE BOLUS VIA INFUSION
6.0000 g | Freq: Once | INTRAVENOUS | Status: AC
  Administered 2021-07-21: 6 g via INTRAVENOUS
  Filled 2021-07-21: qty 1000

## 2021-07-21 MED ORDER — ENOXAPARIN SODIUM 40 MG/0.4ML IJ SOSY: 40.0000 mg | PREFILLED_SYRINGE | INTRAMUSCULAR | Status: DC

## 2021-07-21 MED ORDER — CEFAZOLIN SODIUM-DEXTROSE 2-3 GM-%(50ML) IV SOLR
INTRAVENOUS | Status: DC | PRN
  Administered 2021-07-21: 2 g via INTRAVENOUS

## 2021-07-21 MED ORDER — DIPHENHYDRAMINE HCL 12.5 MG/5ML PO LIQD
25.0000 mg | Freq: Four times a day (QID) | ORAL | Status: DC | PRN
Start: 2021-07-21 — End: 2021-07-24

## 2021-07-21 MED ORDER — DIBUCAINE (PERIANAL) 1 % EX OINT: 1.0000 "application " | TOPICAL_OINTMENT | CUTANEOUS | Status: DC | PRN

## 2021-07-21 MED ORDER — PROPOFOL 10 MG/ML IV BOLUS
INTRAVENOUS | Status: DC | PRN
  Administered 2021-07-21: 120 mg via INTRAVENOUS

## 2021-07-21 MED ORDER — ONDANSETRON HCL 4 MG/2ML IJ SOLN
INTRAMUSCULAR | Status: AC
  Filled 2021-07-21: qty 2

## 2021-07-21 MED ORDER — HYDRALAZINE HCL 20 MG/ML IJ SOLN: 10.0000 mg | INTRAMUSCULAR | Status: DC | PRN

## 2021-07-21 MED ORDER — SOD CITRATE-CITRIC ACID 500-334 MG/5ML PO SOLN
ORAL | Status: AC
  Filled 2021-07-21: qty 30

## 2021-07-21 MED ORDER — OXYCODONE HCL 5 MG PO TABS
5.0000 mg | ORAL_TABLET | ORAL | Status: DC | PRN
  Administered 2021-07-22 – 2021-07-23 (×5): 10 mg via ORAL
  Administered 2021-07-23: 5 mg via ORAL
  Administered 2021-07-23 – 2021-07-24 (×3): 10 mg via ORAL
  Filled 2021-07-21 (×7): qty 2
  Filled 2021-07-21: qty 1
  Filled 2021-07-21: qty 2

## 2021-07-21 MED ORDER — SIMETHICONE 80 MG PO CHEW: 80.0000 mg | CHEWABLE_TABLET | ORAL | Status: DC | PRN

## 2021-07-21 MED ORDER — HYDRALAZINE HCL 20 MG/ML IJ SOLN
5.0000 mg | INTRAMUSCULAR | Status: DC | PRN
  Administered 2021-07-24: 5 mg via INTRAVENOUS
  Filled 2021-07-21: qty 1

## 2021-07-21 MED ORDER — SOD CITRATE-CITRIC ACID 500-334 MG/5ML PO SOLN: 30.0000 mL | ORAL | Status: DC

## 2021-07-21 MED ORDER — PHENYLEPHRINE 80 MCG/ML (10ML) SYRINGE FOR IV PUSH (FOR BLOOD PRESSURE SUPPORT)
PREFILLED_SYRINGE | INTRAVENOUS | Status: DC | PRN
  Administered 2021-07-21 (×2): 160 ug via INTRAVENOUS

## 2021-07-21 MED ORDER — SODIUM CHLORIDE 0.9 % IV SOLN: INTRAVENOUS | Status: DC | PRN

## 2021-07-21 MED ORDER — BUPIVACAINE HCL 0.25 % IJ SOLN
INTRAMUSCULAR | Status: DC | PRN
  Administered 2021-07-21: 30 mL

## 2021-07-21 MED ORDER — DEXAMETHASONE SODIUM PHOSPHATE 4 MG/ML IJ SOLN
INTRAMUSCULAR | Status: AC
  Filled 2021-07-21: qty 1

## 2021-07-21 MED ORDER — BUPIVACAINE HCL (PF) 0.25 % IJ SOLN
INTRAMUSCULAR | Status: AC
  Filled 2021-07-21: qty 40

## 2021-07-21 MED ORDER — MAGNESIUM SULFATE 40 GM/1000ML IV SOLN
INTRAVENOUS | Status: AC
  Filled 2021-07-21: qty 1000

## 2021-07-21 MED ORDER — PHENYLEPHRINE 80 MCG/ML (10ML) SYRINGE FOR IV PUSH (FOR BLOOD PRESSURE SUPPORT)
PREFILLED_SYRINGE | INTRAVENOUS | Status: AC
  Filled 2021-07-21: qty 10

## 2021-07-21 MED ORDER — MENTHOL 3 MG MT LOZG: 1.0000 | LOZENGE | OROMUCOSAL | Status: DC | PRN

## 2021-07-21 MED ORDER — LORAZEPAM 1 MG PO TABS
2.0000 mg | ORAL_TABLET | Freq: Four times a day (QID) | ORAL | Status: DC | PRN
  Administered 2021-07-22 – 2021-07-23 (×4): 2 mg via ORAL
  Filled 2021-07-21 (×4): qty 2

## 2021-07-21 MED ORDER — MEASLES, MUMPS & RUBELLA VAC IJ SOLR: 0.5000 mL | Freq: Once | INTRAMUSCULAR | Status: DC

## 2021-07-21 MED ORDER — ACETAMINOPHEN 10 MG/ML IV SOLN
INTRAVENOUS | Status: AC
  Filled 2021-07-21: qty 100

## 2021-07-21 MED ORDER — PRENATAL MULTIVITAMIN CH
1.0000 | ORAL_TABLET | Freq: Every day | ORAL | Status: DC
  Administered 2021-07-22 – 2021-07-24 (×3): 1 via ORAL
  Filled 2021-07-21 (×3): qty 1

## 2021-07-21 MED ORDER — MOMETASONE FURO-FORMOTEROL FUM 100-5 MCG/ACT IN AERO
2.0000 | INHALATION_SPRAY | Freq: Two times a day (BID) | RESPIRATORY_TRACT | Status: DC
  Filled 2021-07-21: qty 8.8

## 2021-07-21 MED ORDER — LACTATED RINGERS IV SOLN
INTRAVENOUS | Status: DC
Start: 2021-07-21 — End: 2021-07-24

## 2021-07-21 MED ORDER — ZOLPIDEM TARTRATE 5 MG PO TABS
5.0000 mg | ORAL_TABLET | Freq: Every evening | ORAL | Status: DC | PRN
  Administered 2021-07-22: 5 mg via ORAL
  Filled 2021-07-21: qty 1

## 2021-07-21 MED ORDER — ACETAMINOPHEN 500 MG PO TABS
1000.0000 mg | ORAL_TABLET | Freq: Four times a day (QID) | ORAL | Status: DC
  Administered 2021-07-22 (×3): 1000 mg via ORAL
  Filled 2021-07-21 (×3): qty 2

## 2021-07-21 MED ORDER — OXYTOCIN-SODIUM CHLORIDE 30-0.9 UT/500ML-% IV SOLN: 2.5000 [IU]/h | INTRAVENOUS | Status: AC

## 2021-07-21 MED ORDER — DEXAMETHASONE SODIUM PHOSPHATE 10 MG/ML IJ SOLN
INTRAMUSCULAR | Status: DC | PRN
  Administered 2021-07-21: 4 mg via INTRAVENOUS

## 2021-07-21 MED ORDER — COCONUT OIL OIL: 1.0000 "application " | TOPICAL_OIL | Status: DC | PRN

## 2021-07-21 MED ORDER — ROCURONIUM BROMIDE 10 MG/ML (PF) SYRINGE
PREFILLED_SYRINGE | INTRAVENOUS | Status: DC | PRN
  Administered 2021-07-21: 30 mg via INTRAVENOUS
  Administered 2021-07-21: 10 mg via INTRAVENOUS

## 2021-07-21 MED ORDER — SENNOSIDES-DOCUSATE SODIUM 8.6-50 MG PO TABS: 2.0000 | ORAL_TABLET | Freq: Every day | ORAL | Status: DC

## 2021-07-21 MED ORDER — CEFAZOLIN SODIUM-DEXTROSE 2-4 GM/100ML-% IV SOLN
2.0000 g | INTRAVENOUS | Status: DC
  Filled 2021-07-21: qty 100

## 2021-07-21 MED ORDER — VENLAFAXINE HCL ER 75 MG PO CP24
225.0000 mg | ORAL_CAPSULE | Freq: Every day | ORAL | Status: DC
  Administered 2021-07-22 – 2021-07-24 (×3): 225 mg via ORAL
  Filled 2021-07-21 (×3): qty 1

## 2021-07-21 MED ORDER — BETAMETHASONE SOD PHOS & ACET 6 (3-3) MG/ML IJ SUSP
12.0000 mg | INTRAMUSCULAR | Status: DC
  Administered 2021-07-21: 12 mg via INTRAMUSCULAR
  Filled 2021-07-21: qty 5

## 2021-07-21 MED ORDER — MAGNESIUM SULFATE 40 GM/1000ML IV SOLN
2.0000 g/h | INTRAVENOUS | Status: DC
  Administered 2021-07-21 (×2): 2 g/h via INTRAVENOUS
  Filled 2021-07-21: qty 1000

## 2021-07-21 MED ORDER — HYDROMORPHONE HCL 1 MG/ML IJ SOLN
0.2000 mg | INTRAMUSCULAR | Status: DC | PRN
  Administered 2021-07-22 – 2021-07-23 (×5): 0.6 mg via INTRAVENOUS
  Filled 2021-07-21 (×5): qty 1

## 2021-07-21 MED ORDER — FENTANYL CITRATE (PF) 250 MCG/5ML IJ SOLN
INTRAMUSCULAR | Status: AC
Start: 2021-07-21 — End: ?
  Filled 2021-07-21: qty 5

## 2021-07-21 MED ORDER — HYDRALAZINE HCL 20 MG/ML IJ SOLN
20.0000 mg | Freq: Once | INTRAMUSCULAR | Status: AC
  Administered 2021-07-21: 20 mg via INTRAVENOUS
  Filled 2021-07-21: qty 1

## 2021-07-21 MED ORDER — LACTATED RINGERS IV SOLN: INTRAVENOUS | Status: DC

## 2021-07-21 MED ORDER — TRANEXAMIC ACID-NACL 1000-0.7 MG/100ML-% IV SOLN
INTRAVENOUS | Status: AC
  Filled 2021-07-21: qty 100

## 2021-07-21 MED ORDER — FENTANYL CITRATE (PF) 100 MCG/2ML IJ SOLN: 25.0000 ug | INTRAMUSCULAR | Status: DC | PRN

## 2021-07-21 MED ORDER — SUGAMMADEX SODIUM 200 MG/2ML IV SOLN
INTRAVENOUS | Status: DC | PRN
Start: 2021-07-21 — End: 2021-07-21
  Administered 2021-07-21: 200 mg via INTRAVENOUS

## 2021-07-21 MED ORDER — ACETAMINOPHEN 10 MG/ML IV SOLN
1000.0000 mg | Freq: Once | INTRAVENOUS | Status: DC | PRN
  Administered 2021-07-21: 1000 mg via INTRAVENOUS

## 2021-07-21 MED ORDER — WITCH HAZEL-GLYCERIN EX PADS: 1.0000 "application " | MEDICATED_PAD | CUTANEOUS | Status: DC | PRN

## 2021-07-21 MED ORDER — FENTANYL CITRATE (PF) 250 MCG/5ML IJ SOLN
INTRAMUSCULAR | Status: AC
  Filled 2021-07-21: qty 5

## 2021-07-21 MED ORDER — IBUPROFEN 600 MG PO TABS
600.0000 mg | ORAL_TABLET | Freq: Four times a day (QID) | ORAL | Status: DC
  Administered 2021-07-22 – 2021-07-24 (×7): 600 mg via ORAL
  Filled 2021-07-21 (×7): qty 1

## 2021-07-21 MED ORDER — HYDRALAZINE HCL 50 MG PO TABS
25.0000 mg | ORAL_TABLET | Freq: Three times a day (TID) | ORAL | Status: DC
  Administered 2021-07-21 – 2021-07-22 (×2): 25 mg via ORAL
  Filled 2021-07-21 (×2): qty 1

## 2021-07-21 MED ORDER — SUCCINYLCHOLINE CHLORIDE 200 MG/10ML IV SOSY
PREFILLED_SYRINGE | INTRAVENOUS | Status: DC | PRN
  Administered 2021-07-21: 200 mg via INTRAVENOUS

## 2021-07-21 MED ORDER — LABETALOL HCL 5 MG/ML IV SOLN: 20.0000 mg | INTRAVENOUS | Status: DC | PRN

## 2021-07-21 MED ORDER — SIMETHICONE 80 MG PO CHEW
80.0000 mg | CHEWABLE_TABLET | Freq: Three times a day (TID) | ORAL | Status: DC
  Administered 2021-07-22 – 2021-07-24 (×8): 80 mg via ORAL
  Filled 2021-07-21 (×8): qty 1

## 2021-07-21 MED ORDER — TRANEXAMIC ACID-NACL 1000-0.7 MG/100ML-% IV SOLN
INTRAVENOUS | Status: DC | PRN
  Administered 2021-07-21: 1000 mg via INTRAVENOUS

## 2021-07-21 MED ORDER — MIDAZOLAM HCL 2 MG/2ML IJ SOLN
INTRAMUSCULAR | Status: AC
  Filled 2021-07-21: qty 2

## 2021-07-21 MED ORDER — MAGNESIUM SULFATE 40 GM/1000ML IV SOLN
2.0000 g/h | INTRAVENOUS | Status: AC
  Administered 2021-07-22: 2 g/h via INTRAVENOUS
  Filled 2021-07-21: qty 1000

## 2021-07-21 MED ORDER — SUCCINYLCHOLINE CHLORIDE 200 MG/10ML IV SOSY
PREFILLED_SYRINGE | INTRAVENOUS | Status: AC
  Filled 2021-07-21: qty 10

## 2021-07-21 MED ORDER — OXYTOCIN-SODIUM CHLORIDE 30-0.9 UT/500ML-% IV SOLN
INTRAVENOUS | Status: DC | PRN
  Administered 2021-07-21: 30 [IU] via INTRAVENOUS

## 2021-07-21 SURGICAL SUPPLY — 33 items
BENZOIN TINCTURE PRP APPL 2/3 (GAUZE/BANDAGES/DRESSINGS) ×2 IMPLANT
CHLORAPREP W/TINT 26 (MISCELLANEOUS) ×4 IMPLANT
CLAMP CORD UMBIL (MISCELLANEOUS) ×2 IMPLANT
CLOTH BEACON ORANGE TIMEOUT ST (SAFETY) ×2 IMPLANT
DRSG OPSITE POSTOP 4X10 (GAUZE/BANDAGES/DRESSINGS) ×2 IMPLANT
ELECT REM PT RETURN 9FT ADLT (ELECTROSURGICAL) ×2
ELECTRODE REM PT RTRN 9FT ADLT (ELECTROSURGICAL) ×1 IMPLANT
EXTRACTOR VACUUM M CUP 4 TUBE (SUCTIONS) IMPLANT
GLOVE BIOGEL PI IND STRL 7.0 (GLOVE) ×3 IMPLANT
GLOVE BIOGEL PI INDICATOR 7.0 (GLOVE) ×3
GLOVE ECLIPSE 7.0 STRL STRAW (GLOVE) ×2 IMPLANT
GOWN STRL REUS W/TWL LRG LVL3 (GOWN DISPOSABLE) ×4 IMPLANT
KIT ABG SYR 3ML LUER SLIP (SYRINGE) ×2 IMPLANT
NDL HYPO 25X5/8 SAFETYGLIDE (NEEDLE) ×1 IMPLANT
NEEDLE HYPO 22GX1.5 SAFETY (NEEDLE) ×2 IMPLANT
NEEDLE HYPO 25X5/8 SAFETYGLIDE (NEEDLE) ×2 IMPLANT
NS IRRIG 1000ML POUR BTL (IV SOLUTION) ×2 IMPLANT
PACK C SECTION WH (CUSTOM PROCEDURE TRAY) ×2 IMPLANT
PAD ABD 7.5X8 STRL (GAUZE/BANDAGES/DRESSINGS) ×2 IMPLANT
PAD OB MATERNITY 4.3X12.25 (PERSONAL CARE ITEMS) ×2 IMPLANT
RTRCTR C-SECT PINK 25CM LRG (MISCELLANEOUS) ×2 IMPLANT
STRIP CLOSURE SKIN 1/2X4 (GAUZE/BANDAGES/DRESSINGS) ×2 IMPLANT
SUT MNCRL 0 VIOLET CTX 36 (SUTURE) ×2 IMPLANT
SUT MON AB 2-0 SH 27 (SUTURE) ×2
SUT MON AB 2-0 SH27 (SUTURE) IMPLANT
SUT MONOCRYL 0 CTX 36 (SUTURE) ×2
SUT VIC AB 0 CTX 36 (SUTURE) ×1
SUT VIC AB 0 CTX36XBRD ANBCTRL (SUTURE) ×1 IMPLANT
SUT VIC AB 4-0 KS 27 (SUTURE) ×2 IMPLANT
SYR 30ML LL (SYRINGE) ×2 IMPLANT
TOWEL OR 17X24 6PK STRL BLUE (TOWEL DISPOSABLE) ×2 IMPLANT
TRAY FOLEY W/BAG SLVR 14FR LF (SET/KITS/TRAYS/PACK) ×2 IMPLANT
WATER STERILE IRR 1000ML POUR (IV SOLUTION) ×2 IMPLANT

## 2021-07-21 NOTE — Progress Notes (Signed)
Upon arrival, patient was being transferred off unit. Fran Lowes, RN VAST

## 2021-07-21 NOTE — Progress Notes (Signed)
Patient ID: Krystal [name], female   DOB: [birth year], 31 y.o.   MRN: [id number] [place] COMPREHENSIVE PROGRESS NOTE  Krystal [name] is a 31 y.o. [id number] at [redacted]w[redacted]d who is admitted for uncontrolled HTN, chronic N/V, FGR and anxietydepression.    Fetal presentation is breech. Length of Stay:  19  Days  Subjective: Pt same as yesterday.  Keeping food down. Notes LE swelling  Patient reports good fetal movement.  Krystal [name] reports no uterine contractions, no bleeding and no loss of fluid per vagina.  Vitals:  Blood pressure (!) 153/90, pulse 88, temperature 98 F (36.7 C), temperature source Oral, resp. rate 20, height '5\' 11"'$  (1.803 m), weight 113.3 kg, last menstrual period 01/19/2021, SpO2 100 %, not currently breastfeeding.  Physical Examination: Lungs clear Heart RRR Abd soft + BS gravid Ext non tender  Fetal Monitoring:  Baseline: 150's bpm  Labs:     Latest Ref Rng & Units 07/19/2021    5:23 AM 07/18/2021    5:29 AM 07/17/2021    5:09 AM  CMP  Glucose 70 - 99 mg/dL 81   96   96    BUN 6 - 20 mg/dL '13   11   16    '$ Creatinine 0.44 - 1.00 mg/dL 0.80   0.76   0.87    Sodium 135 - 145 mmol/L 135   137   136    Potassium 3.5 - 5.1 mmol/L 3.8   3.6   3.7    Chloride 98 - 111 mmol/L 104   106   105    CO2 22 - 32 mmol/L '23   23   24    '$ Calcium 8.9 - 10.3 mg/dL 8.7   8.9   9.2    Total Protein 6.5 - 8.1 g/dL 4.8   4.9   5.1    Total Bilirubin 0.3 - 1.2 mg/dL 0.3   0.5   0.3    Alkaline Phos 38 - 126 U/L 52   53   52    AST 15 - 41 U/L '15   16   20    '$ ALT 0 - 44 U/L '18   19   23         '$ Latest Ref Rng & Units 07/17/2021    5:09 AM 07/08/2021    4:12 AM 07/06/2021    5:11 AM  CBC  WBC 4.0 - 10.5 K/uL 12.1   8.9   9.4    Hemoglobin 12.0 - 15.0 g/dL 10.6   10.6   10.7    Hematocrit 36.0 - 46.0 % 29.8   30.8   30.5    Platelets 150 - 400 K/uL 172   181   224       Imaging Studies:    NA   Medications:  Scheduled  amLODipine  10 mg Oral Daily   aspirin   162 mg Oral Daily   clonazePAM  1 mg Oral BID   dicyclomine  20 mg Oral TID AC   hydrALAZINE  100 mg Oral Q8H   labetalol  600 mg Oral Q8H   levothyroxine  125 mcg Oral QAC breakfast   loratadine  10 mg Oral Daily   magnesium oxide  400 mg Oral BID   ondansetron  8 mg Oral Q8H   pantoprazole sodium  40 mg Oral Daily   polyethylene glycol  17 g Oral BID   predniSONE  10 mg Oral Q breakfast  prenatal vitamin w/FE, FA  1 tablet Oral Q1200   sodium chloride flush  3 mL Intravenous Q12H   thiamine  100 mg Oral Daily   venlafaxine XR  225 mg Oral Daily   I have reviewed the patient's current medications.  ASSESSMENT: IUP [redacted]w[redacted]d CHTN FGR [facility] Anxiety/depression  PLAN: Doing well with feeding tube in. Continue diet per nutrition.   Still striving for discharge next Tuesday although potentially Wednesday as Krystal [name] has [facility]. HH is already in place.  BP overall well controlled now that keeping medications down.   Psych consult by Dr. CLovette Clichegreatly appreciated. Orders adjusted for the weekend.   Continue routine antenatal care.   PRadene Gunning5/28/2023,7:08 AM

## 2021-07-21 NOTE — Progress Notes (Signed)
Asked to see patient due to new onset chest pain. Patient reports having intermittent chest pain through the evening, and has been constant for the last 2 hours. Initially called about this and given p.o. PPI which did not help. Patient also has now had multiple episodes of severe range blood pressures.  We have instituted labetalol protocol. On exam patient is awake and alert she is moaning in pain.  On exam her abdomen is soft there is no real tenderness to palpation in the epigastrium or right upper quadrant area.  Given unclear etiology and worsening blood pressure will check PIH labs Troponins EKG Chest x-ray Right upper quadrant ultrasound Continue to treat her blood pressures though she is maxed out on maximum oral blood pressure medication at this time. Patient has been given oxycodone and Ativan but pain has persisted.  We will give 1 dose of IV Dilaudid.

## 2021-07-21 NOTE — Anesthesia Procedure Notes (Addendum)
Procedure Name: Intubation Date/Time: 07/21/2021 5:52 PM Performed by: Ignacia Bayley, CRNA Pre-anesthesia Checklist: Patient identified, Patient being monitored, Timeout performed, Emergency Drugs available and Suction available Patient Re-evaluated:Patient Re-evaluated prior to induction Oxygen Delivery Method: Circle System Utilized Preoxygenation: Pre-oxygenation with 100% oxygen Induction Type: IV induction and Rapid sequence Laryngoscope Size: Mac, 3 and Glidescope Grade View: Grade I Tube type: Oral Tube size: 7.0 mm Number of attempts: 1 Airway Equipment and Method: Stylet and Video-laryngoscopy Placement Confirmation: ETT inserted through vocal cords under direct vision, positive ETCO2 and breath sounds checked- equal and bilateral Secured at: 21 cm Tube secured with: Tape Dental Injury: Teeth and Oropharynx as per pre-operative assessment

## 2021-07-21 NOTE — Anesthesia Preprocedure Evaluation (Addendum)
Anesthesia Evaluation  Patient identified by MRN, date of birth, ID band Patient awake    Reviewed: Allergy & Precautions, NPO status , Patient's Chart, lab work & pertinent test results  Airway Mallampati: II  TM Distance: >3 FB Neck ROM: Full    Dental no notable dental hx. (+) Teeth Intact, Dental Advisory Given   Pulmonary asthma ,    Pulmonary exam normal breath sounds clear to auscultation       Cardiovascular hypertension, Pt. on medications Normal cardiovascular exam Rhythm:Regular Rate:Normal     Neuro/Psych  Headaches, PSYCHIATRIC DISORDERS Anxiety Depression    GI/Hepatic Neg liver ROS, GERD  ,  Endo/Other  Hypothyroidism   Renal/GU negative Renal ROS  negative genitourinary   Musculoskeletal  (+) Arthritis , Fibromyalgia -  Abdominal   Peds  Hematology  (+) Blood dyscrasia (Hgb 10.4, plt 64), anemia ,   Anesthesia Other Findings [redacted] weeks gestation admitted for severe uncontrolled HTN and N/V requiring tube feeds. Now with s/s of HELLP. Fetus with known tetrology of fallot.   S/p LUE amputation   Reproductive/Obstetrics (+) Pregnancy                            Anesthesia Physical Anesthesia Plan  ASA: 4 and emergent  Anesthesia Plan: General   Post-op Pain Management:    Induction: Intravenous  PONV Risk Score and Plan: 3 and Dexamethasone and Ondansetron  Airway Management Planned: Oral ETT  Additional Equipment:   Intra-op Plan:   Post-operative Plan: Extubation in OR  Informed Consent: I have reviewed the patients History and Physical, chart, labs and discussed the procedure including the risks, benefits and alternatives for the proposed anesthesia with the patient or authorized representative who has indicated his/her understanding and acceptance.     Dental advisory given  Plan Discussed with: CRNA  Anesthesia Plan Comments:         Anesthesia  Quick Evaluation

## 2021-07-21 NOTE — Transfer of Care (Signed)
Immediate Anesthesia Transfer of Care Note  Patient: Krystal [name]  Procedure(s) Performed: CESAREAN SECTION  Patient Location: PACU  Anesthesia Type:General  Level of Consciousness: sedated  Airway & Oxygen Therapy: Patient Spontanous Breathing  Post-op Assessment: Report given to RN and Post -op Vital signs reviewed and stable  Post vital signs: Reviewed and stable  Last Vitals:  Vitals Value Taken Time  BP    Temp    Pulse    Resp    SpO2      Last Pain:  Vitals:   07/21/21 1638  TempSrc:   PainSc: 4       Patients Stated Pain Goal: 4 (90/37/95 5831)  Complications: No notable events documented.

## 2021-07-21 NOTE — Op Note (Addendum)
Preoperative Diagnosis:  IUP @ [redacted]w[redacted]d HELLP Syndrome, Previous C-section x 2, breech presentation,  Undesired Fertility  Postoperative Diagnosis:  Same  Procedure: Repeat low transverse cesarean section, Bilateral Salpingectomy  Surgeon: TDarron Doom M.D.  Assistant: ARenard Matter MD An experienced assistant was required given the standard of surgical care given the complexity of the case.  This assistant was needed for exposure, dissection, suctioning, retraction, instrument exchange, assisting with delivery with administration of fundal pressure, and for overall help during the procedure.  Anesthesia: spinal with WFreddrick March MD  Findings: Viable female infant, APGAR and weight pending, Normal tubes and ovaries  Estimated blood loss: 600 cc, 125 cc given back via cell saver  Complications: None known  Specimens: Placenta to pathology, bilateral tubes to pathology  Reason for procedure: Briefly, the patient is a 31y.o. [id number] 33w1dith h/o previous C-section x 2, with HELLP Syndrome,who desires permanent sterility.  Patient counseled, r.e. Risks benefits of BTL, including permanency of procedure.  Patient verbalized understanding and desires to proceed.  Procedure: Patient is taken to the OR where spinal analgesia was administered. She was then placed in a supine position with left lateral tilt. She received 2 g of Ancef and SCDs were in place. A Foley catheter was placed in the bladder. She was prepped and draped in the usual sterile fashion. A timeout was performed. A knife was then used to make a Pfannenstiel incision. This incision was carried out to underlying fascia which was divided in the midline with the knife. The incision was extended laterally, sharply. The fascia was dissected of the underlying rectus superiorly.  The rectus was divided in the midline.  The peritoneal cavity was entered bluntly.  Alexis retractor was placed inside the incision.  A knife was used to make  a low transverse incision on the uterus. This incision was carried down to the amniotic cavity was entered. Fetus was in breech presentation and was brought up out of the incision without difficulty en caul. Delayed cord clamping x 1 minute. Cord was clamped x 2 and cut. Infant taken to waiting pediatrician. Placenta was delivered from the uterus.  Uterus was cleaned with dry lap pads. Uterine incision closed with 0 Vicryl suture in a running fashion. Attention was turned to the pt's left tube which was grasped with a Babcock clamp and followed to its fimbriated end.  Two Kelly clamps used to clamp the mesosalpinx and tube with fimbriated end removed with Metzenbaum scissors. A 2-0 Monocryl on an SH was used in a Heaney type stitch beneath each Kelly clamp for hemostasis. A similar process was carried out on the right to allow for bilateral salpingectomy. Uterine incision with bleeding in midline, stopped with Figure of eight x 1 and electrocautery.  Alexis retractor was removed from the abdomen. Peritoneal closure was done with 0 Vicryl suture.   Fascia is closed with 0 Vicryl suture in a running fashion. Subcutaneous tissue infused with 30cc 0.25% Marcaine.  Subcutaneous closure was performed with 0 Plain suture.  Skin closed using 3-0 Vicryl on a Keith needle.  Honeycomb dressing applied, followed by pressure dressing.  All instrument, needle and lap counts were correct x 2.  Patient was awake and taken to PACU stable.  Infant to NICU, stable.

## 2021-07-21 NOTE — Discharge Summary (Signed)
Postpartum Discharge Summary  Date of Service updated-5/31     Patient Name: Krystal [name] DOB: 1991/02/23 MRN: [id number]  Date of admission: 07/02/2021 Delivery date:07/21/2021  Delivering provider: Donnamae Jude  Date of discharge: 07/24/21  Admitting diagnosis: IUGR, antenatal [O36.5990] Intrauterine pregnancy: [redacted]w[redacted]d    Secondary diagnosis:  Principal Problem:   Intractable nausea and vomiting Active Problems:   Anxiety   History of HELLP syndrome, currently pregnant   Posttraumatic stress disorder   Essential hypertension   MDD (major depressive disorder), recurrent episode, severe (HMason   History of preterm delivery, currently pregnant   Attention deficit disorder   IUGR, antenatal   Anisocoria   Chronic hypertension with exacerbation during pregnancy in second trimester   Poor fetal growth affecting management of mother in second trimester   Headache in pregnancy, antepartum, second trimester   [redacted] weeks gestation of pregnancy   Status post repeat low transverse cesarean section  Additional problems: HELLP    Discharge diagnosis: Preterm Pregnancy Delivered and HELLP syndrome                 -IUGR, chronic HTN, MDD, anxiety, nausea/vomiting                           Post partum procedures:postpartum tubal ligation Augmentation: N/A Complications: None  Hospital course: Sceduled C/S   31y.o. yo G5P0222 at 255w1das admitted to the hospital 07/02/2021 for severe uncontrolled HTN (despite multiple antihypertensive agents), anxiety, severe nausea and vomiting.  Additionally due to change in her pupils, stroke work up was also completed and found to be negative.  She also was found to have a fetus with tetralogy of fallot- both MFM and NICU teams were on board with management.  Her admission was working on managing her worsening anxiety as well as her nausea and vomiting.  She was treated for her nausea and ultimately required feeding tube for nutrition. Her blood  pressures improved once able to keep down her medications.  She then developed RUQ pain on 5/28 with worsening blood pressures.  Labs were sent and noted to have elevated LFTs (AST 15-->98-->590 and ALT 18-->93-->366) and low platelets (172-->137-->64)  .  Due to diagnosis of HELP, she underwent an unscheduled cesarean section with bilateral salpingectomy with the following indication: HELLP syndrome and desire for permanent sterilization Membrane Rupture Time/Date: 6:06 PM ,07/21/2021   Delivery Method:C-Section, Low Transverse  Details of operation can be found in separate operative note.  She was treated with IV magnesium x 24hr  as well as anti-hypertensive agents, which she was previously taking.  Since Ms. Maney was no longer pregnant, the feeding tube was removed and her diet was advance.  By POD #3, she was ambulating, tolerating a regular diet, passing flatus, having BMs, and urinating well. While her BP was still elevated, BP management has been a chronic issue.  Her PCP was contacted who adjusted her BP medication and advised close outpatient follow up.  Psych also was involved in establishing outpatient management- advised Venlafaxine and weening dose of Lorazepam.  Patient is discharged home in stable condition on  07/25/21  Current BP meds: Norvasc 10, Hydralazine 100q8, Lisinopril 10m79mid        Newborn Data: Birth date:07/21/2021  Birth time:6:04 PM  Gender:Female  Living status:Living  Apgars:4 ,6  Weight:650 g     Magnesium Sulfate received: Yes: seizure prophylaxis and neuroprotection BMZ received: Yes  Rhophylac:N/A MMR:No T-DaP: declined Flu: N/A Transfusion:No  Physical exam  Vitals:   07/24/21 0433 07/24/21 0751 07/24/21 0806 07/24/21 0953  BP: (!) 156/89 (!) 170/99 (!) 153/113 (!) 155/93  Pulse: 86 77 82   Resp:  16    Temp:  98 F (36.7 C)    TempSrc:      SpO2:  100%    Weight:      Height:       General: alert, cooperative, and no distress Lochia:  appropriate Uterine Fundus: firm Incision: Healing well with no significant drainage, Dressing is clean, dry, and intact DVT Evaluation: No evidence of DVT seen on physical exam. Labs: Lab Results  Component Value Date   WBC 15.1 (H) 07/23/2021   HGB 8.3 (L) 07/23/2021   HCT 24.1 (L) 07/23/2021   MCV 95.3 07/23/2021   PLT 104 (L) 07/23/2021      Latest Ref Rng & Units 07/23/2021    4:40 AM  CMP  Glucose 70 - 99 mg/dL 128    BUN 6 - 20 mg/dL 17    Creatinine 0.44 - 1.00 mg/dL 0.94    Sodium 135 - 145 mmol/L 139    Potassium 3.5 - 5.1 mmol/L 3.5    Chloride 98 - 111 mmol/L 106    CO2 22 - 32 mmol/L 23    Calcium 8.9 - 10.3 mg/dL 7.4    Total Protein 6.5 - 8.1 g/dL 5.4    Total Bilirubin 0.3 - 1.2 mg/dL 0.2    Alkaline Phos 38 - 126 U/L 56    AST 15 - 41 U/L 117    ALT 0 - 44 U/L 212     Edinburgh Score:     View : No data to display.           After visit meds:  Allergies as of 07/24/2021       Reactions   Red Dye Other (See Comments)   ALL CAUSE PROBLEMS #40 IS WORST-MIGRAINES    Compazine [prochlorperazine Maleate] Other (See Comments)   TWITCHING, CANT STAY STILL. Pt states she can tolerate promethazine   Other Other (See Comments)   FANSIDAR FOR MALARIA-ASTHMA   Vicodin [hydrocodone-acetaminophen] Itching, Nausea And Vomiting, Other (See Comments)   Pt states she can tolerate acetaminophen   Amitriptyline Hcl Other (See Comments)   Angry moods   Duloxetine Hcl Other (See Comments)   Passing out, trimble, irritable   Reglan [metoclopramide] Other (See Comments)   restless   Nifedipine Nausea And Vomiting, Other (See Comments)   Discussed with patient and maternal fetal medicine, patient has intolerance but not frank allergy.        Medication List     STOP taking these medications    aspirin EC 81 MG tablet   famotidine 20 MG tablet Commonly known as: PEPCID   hydrOXYzine 25 MG capsule Commonly known as: VISTARIL   labetalol 300 MG  tablet Commonly known as: NORMODYNE   ondansetron 8 MG disintegrating tablet Commonly known as: ZOFRAN-ODT   promethazine 25 MG tablet Commonly known as: PHENERGAN   scopolamine 1 MG/3DAYS Commonly known as: TRANSDERM-SCOP       TAKE these medications    acetaminophen 500 MG tablet Commonly known as: TYLENOL Take 2 tablets (1,000 mg total) by mouth every 6 (six) hours as needed.   albuterol 108 (90 Base) MCG/ACT inhaler Commonly known as: VENTOLIN HFA Inhale 2 puffs into the lungs every 6 (six) hours as needed for wheezing or  shortness of breath.   amLODipine 10 MG tablet Commonly known as: NORVASC Take 1 tablet (10 mg total) by mouth daily.   budesonide-formoterol 80-4.5 MCG/ACT inhaler Commonly known as: Symbicort Inhale 2 puffs into the lungs in the morning and at bedtime. Take for 1-2 weeks at a time during asthma flare. Rinse mouth after each use.   cetirizine 10 MG tablet Commonly known as: ZyrTEC Allergy Take 1 tablet (10 mg total) by mouth 2 (two) times daily.   cholecalciferol 25 MCG (1000 UNIT) tablet Commonly known as: VITAMIN D3 Take 1,000 Units by mouth daily.   EPINEPHrine 0.3 mg/0.3 mL Soaj injection Commonly known as: EPI-PEN Inject 0.3 mg into the muscle as needed for anaphylaxis.   furosemide 20 MG tablet Commonly known as: LASIX Take 1 tablet (20 mg total) by mouth daily for 3 days.   hydrALAZINE 100 MG tablet Commonly known as: APRESOLINE Take 1 tablet (100 mg total) by mouth 3 (three) times daily.   ibuprofen 600 MG tablet Commonly known as: ADVIL Take 1 tablet (600 mg total) by mouth every 6 (six) hours as needed.   levothyroxine 125 MCG tablet Commonly known as: Synthroid Take 1 tablet (125 mcg total) by mouth daily before breakfast.   lisinopril 5 MG tablet Commonly known as: ZESTRIL Take 1 tablet (5 mg total) by mouth 2 (two) times daily.   LORazepam 1 MG tablet Commonly known as: ATIVAN Take 2 tablets (2 mg total) by mouth  every 6 (six) hours as needed for anxiety.   oxyCODONE 5 MG immediate release tablet Commonly known as: Oxy IR/ROXICODONE Take 1-2 tablets (5-10 mg total) by mouth every 6 (six) hours as needed for up to 7 days for severe pain or breakthrough pain.   pantoprazole 40 MG tablet Commonly known as: Protonix Take 1 tablet (40 mg total) by mouth daily.   PRE-NATAL PO Take 1 tablet by mouth daily.   senna 8.6 MG Tabs tablet Commonly known as: Senokot Take 1 tablet (8.6 mg total) by mouth daily as needed for constipation.   venlafaxine XR 75 MG 24 hr capsule Commonly known as: EFFEXOR-XR Take 3 capsules (225 mg total) by mouth daily with breakfast. What changed:  medication strength how much to take when to take this         Discharge home in stable condition Infant Feeding:  in NICU Infant Disposition:NICU Discharge instruction: per After Visit Summary and Postpartum booklet. Activity: Advance as tolerated. Pelvic rest for 6 weeks.  Diet: low salt diet Future Appointments: Future Appointments  Date Time Provider [place]  07/29/2021  9:00 AM [facility] NURSE [facility]  08/05/2021  2:15 PM [place] [facility]  08/29/2021  3:55 PM Renard Matter, MD [facility]  09/30/2021 11:30 AM Garnet Sierras, DO [facility] None   Follow up Visit:  Follow-up Information     Audley Hose, MD .   Specialty: Internal Medicine Contact information: [place] [address] [place] [facility] [place] [facility]. Schedule an appointment as soon as possible for a visit in 1 week(s).   Specialty: Obstetrics and Gynecology Contact information: [place] [id number] [phone]               Message sent to [facility] by Dr. Cy Blamer on 5/28  Please schedule this patient for a In person postpartum visit in 4 weeks with  the following provider: Any  provider. Additional Postpartum F/U:Postpartum Depression checkup, Incision check 1 week, and BP check 1 week  High risk pregnancy complicated by:  elevated blood pressures and ultimately developed HELLP syndrome, tetralogy of fallot,  severe nausea vomiting requiring feeding tube Delivery mode:  C-Section, Low Transverse  Anticipated Birth Control:  BTL done PP   07/25/2021 Annalee Genta, DO

## 2021-07-21 NOTE — Anesthesia Postprocedure Evaluation (Signed)
Anesthesia Post Note  Patient: Krystal [name]  Procedure(s) Performed: CESAREAN SECTION     Patient location during evaluation: PACU Anesthesia Type: General Level of consciousness: awake and alert Pain management: pain level controlled Vital Signs Assessment: post-procedure vital signs reviewed and stable Respiratory status: spontaneous breathing, nonlabored ventilation, respiratory function stable and patient connected to nasal cannula oxygen Cardiovascular status: blood pressure returned to baseline and stable Postop Assessment: no apparent nausea or vomiting Anesthetic complications: no   No notable events documented.  Last Vitals:  Vitals:   07/21/21 2033 07/21/21 2131  BP: (!) 145/77 131/78  Pulse: 82 76  Resp: 20 18  Temp: 36.8 C 36.6 C  SpO2: 96% 97%    Last Pain:  Vitals:   07/21/21 2131  TempSrc: Oral  PainSc:    Pain Goal: Patients Stated Pain Goal: 4 (07/15/21 0835)                 Haywood Lasso L Shaquan Missey

## 2021-07-21 NOTE — Progress Notes (Signed)
Patient ID: Krystal [name], female   DOB: 09-23-1990, 31 y.o.   MRN: [id number] Patient is no longer having severe right upper quadrant pain but she had a bump in her LFTs following this morning's labs and so these were repeated in 5 hours.  At repeat there was a significant jump in her LFTs from 98 and 93 to now 590 and 366.  Her platelets had dropped this morning from 172->137 and are now 64. Given obvious evidence of HELLP we will move to delivery.   Neonatology was called and this was discussed with them.  Patient's last betamethasone was approximately 3 weeks ago. A rescue dose was given earlier today as well as magnesium sulfate for CP and seizure prophylaxis.  This has not been ongoing for more than 12 hours but given the worsening labs, I do feel delivery is indicated now.  Baby also has significant IUGR. Last growth was approximately 3 weeks ago and was 442 g.  NICU is aware of this.    The patient is thought to possibly have Tetralogy of Fallot as well.  This is the second time the patient has required a 26-week delivery for HELLP syndrome.  She was also delivered at 31 weeks with superimposed preeclampsia with severe features.  She has had very difficult to maintain blood pressures and is maxed out on 3 different oral medications. She has required multiple rounds of IV medications to control her blood pressure today.  Lengthy discussion was had with the patient as she has h/o 2 prior cesarean deliveries and about her desires for future fertility.  The patient reports that she and her husband have discussed it and they want permanent sterility.  We discussed that this baby with this condition, size, birth defects may make the outcome poor.  Patient is quite tearful but quite insistent that she wants sterilization.  We will move to repeat low transverse C-section though the baby's been breech and it might require a classical incision with bilateral salpingectomy.  Risks include but are  not limited to bleeding, infection, injury to surrounding structures, including bowel, bladder and ureters, blood clots, and death. Patient counseled, r.e. Risks benefits of BTL, including permanency of procedure..  Patient verbalized understanding and desires to proceed. Likelihood of success is high.

## 2021-07-22 ENCOUNTER — Encounter (HOSPITAL_COMMUNITY): Payer: Self-pay | Admitting: Family Medicine

## 2021-07-22 LAB — COMPREHENSIVE METABOLIC PANEL
ALT: 319 U/L — ABNORMAL HIGH (ref 0–44)
AST: 330 U/L — ABNORMAL HIGH (ref 15–41)
Albumin: 2.7 g/dL — ABNORMAL LOW (ref 3.5–5.0)
Alkaline Phosphatase: 54 U/L (ref 38–126)
Anion gap: 11 (ref 5–15)
BUN: 15 mg/dL (ref 6–20)
CO2: 21 mmol/L — ABNORMAL LOW (ref 22–32)
Calcium: 7.7 mg/dL — ABNORMAL LOW (ref 8.9–10.3)
Chloride: 102 mmol/L (ref 98–111)
Creatinine, Ser: 0.87 mg/dL (ref 0.44–1.00)
GFR, Estimated: >60 mL/min (ref >60)
Glucose, Bld: 126 mg/dL — ABNORMAL HIGH (ref 70–99)
Potassium: 4.6 mmol/L (ref 3.5–5.1)
Sodium: 134 mmol/L — ABNORMAL LOW (ref 135–145)
Total Bilirubin: 0.5 mg/dL (ref 0.3–1.2)
Total Protein: 5.1 g/dL — ABNORMAL LOW (ref 6.5–8.1)

## 2021-07-22 LAB — CBC
HCT: 26.9 % — ABNORMAL LOW (ref 36.0–46.0)
Hemoglobin: 9.8 g/dL — ABNORMAL LOW (ref 12.0–15.0)
MCH: 33.4 pg (ref 26.0–34.0)
MCHC: 36.4 g/dL — ABNORMAL HIGH (ref 30.0–36.0)
MCV: 91.8 fL (ref 80.0–100.0)
Platelets: 83 10*3/uL — ABNORMAL LOW (ref 150–400)
RBC: 2.93 MIL/uL — ABNORMAL LOW (ref 3.87–5.11)
RDW: 13.1 % (ref 11.5–15.5)
WBC: 15.8 10*3/uL — ABNORMAL HIGH (ref 4.0–10.5)
nRBC: 0 % (ref 0.0–0.2)

## 2021-07-22 MED ORDER — ACETAMINOPHEN 325 MG PO TABS
650.0000 mg | ORAL_TABLET | Freq: Four times a day (QID) | ORAL | Status: DC
  Administered 2021-07-22 – 2021-07-24 (×8): 650 mg via ORAL
  Filled 2021-07-22 (×8): qty 2

## 2021-07-22 MED ORDER — PROMETHAZINE HCL 25 MG/ML IJ SOLN
12.5000 mg | Freq: Four times a day (QID) | INTRAMUSCULAR | Status: DC | PRN
  Administered 2021-07-22 (×2): 12.5 mg via INTRAVENOUS
  Filled 2021-07-22 (×2): qty 0.5

## 2021-07-22 MED ORDER — HYDRALAZINE HCL 50 MG PO TABS
50.0000 mg | ORAL_TABLET | Freq: Three times a day (TID) | ORAL | Status: DC
  Administered 2021-07-22: 50 mg via ORAL
  Filled 2021-07-22: qty 1

## 2021-07-22 MED ORDER — HYDRALAZINE HCL 50 MG PO TABS
100.0000 mg | ORAL_TABLET | Freq: Three times a day (TID) | ORAL | Status: DC
  Administered 2021-07-22 – 2021-07-24 (×6): 100 mg via ORAL
  Filled 2021-07-22 (×6): qty 2

## 2021-07-22 NOTE — Consult Note (Signed)
Krystal [name] Psychiatry Followup Face-to-Face Psychiatric Evaluation   Service Date: Jul 22, 2021 LOS:  LOS: 20 days    Assessment  Krystal [name] is a 31 y.o. female admitted medically for 07/02/2021  1:45 PM for  uncontrolled HTN, N/V in pregnancy complicated by tetrology of fallot. She carries the psychiatric diagnoses of depression and anxiety and has a past medical history of asthma, IC, kidney stones, migraines, necrotizing fasciitis, HTN, - prior pregnancies complicated by HELLP and preterm delivery. Psychiatry was reconsulted for same by Dr. Rip [name].   Team familiar with this pt - depression and anxiety complicated by pregnancy and hyperemesis; fairly complicated social situation detailed elsewhere.    This has been a challenging case - in perinatal psychiatry the goal is to fully treat the patient's symptoms with the least # of medications to minimize effects on the fetus.  Unfortunately, pt's symptoms are not getting better on fairly high doses of multiple medications - at this point have some concern that escalating BZD doses are disinhibiting pt more so than they are treating anxiety. Goals for this consult include 1) streamlining medication regimen 2) converting to PO medications to facilitate eventual dc home 3) improve symptoms. Patient through course of interview disclosed that venlafaxine had previously been fairly effective; dose increased ~2 weeks ago, that clonazepam and lorazepam have minimal to no effect on her anxiety, and that the most effective medication she is taking is benadryl. Has not noticed much of an effect from buspirone. Spent bulk of consult on brief supportive therapy and trying to reframe pt's cognitive distortions (particularly prone to catastrophizing).  Discussed pt's options for medication (including de-prescribing ineffective medication) and r/b/se of these - pt consented to trialling olanzapine for anxiety and N/V with other changes as detailed below.  Briefly discussed meds below with Dr. Rip [name] of OB.    5/29: Unfortunately baby delivered over the weekend and is in NICU. It looks like scheduled klonopin and PRN zyprexa stopped by primary team, which was going to be the plan for today. On BZD total for ~2 weeks, should be able to self-taper with PRN ativan. Bulk of interview spent in supportive psychotherapy with theme of validating pt's emotional response to current circumstance and separating the idea of "bad at being pregnant" from being a bad mother.    I personally spent 30 minutes on the unit in direct patient care primarily validati. The direct patient care time included face-to-face time with the patient, reviewing the patient's chart, communicating with other professionals, and coordinating care. Greater than 50% of this time was spent in counseling or coordinating care with the patient regarding goals of hospitalization, psycho-education, and discharge planning needs.   Diagnoses:  Active Hospital problems: Principal Problem:   Intractable nausea and vomiting Active Problems:   Anxiety   History of HELLP syndrome, currently pregnant   Posttraumatic stress disorder   Essential hypertension   MDD (major depressive disorder), recurrent episode, severe (HCC)   History of preterm delivery, currently pregnant   Attention deficit disorder   IUGR, antenatal   Anisocoria   Chronic hypertension with exacerbation during pregnancy in second trimester   Poor fetal growth affecting management of mother in second trimester   Headache in pregnancy, antepartum, second trimester   [redacted] weeks gestation of pregnancy   Status post repeat low transverse cesarean section     Plan  ## Safety and Observation Level:  - Based on my clinical evaluation, I estimate the patient to be at low  risk of self harm in the current setting - At this time, we recommend a routine level of observation - chaplain had recommended companion sitter which    ##  Medications: Today:   -- CONTINUE venlafaxine 225 mg for depression, anxiety, etc -- CONTINUE lorazepam PO 2 mg q6h  Rest of prior meds stopped either by psychiatry or primary team.   ## Medical Decision Making Capacity:  Not formally assessed  ## Further Work-up:   -- Qtc 5/4 had QtC of 438, 4/26 437, 5/29 434 -- Pertinent labwork reviewed earlier this admission includes: TSH wnl  ## Disposition:  -- home - will need outpt counselling and psych f/u - coordinate with SW after federal holiday  ## Behavioral / Environmental:  -- Please make effort to frame discussions in terms of "best case, most likely case, worst case" as pt has tendency to focus on worst case scenarios  ##Legal Status   Thank you for this consult request. Recommendations have been communicated to the primary team.  We will continue to follow at this time.   Krystal [name]   New history  Relevant Aspects of Hospital Course:  Admitted on 07/02/2021 for HTN and N/V in pregnancy. Had been seen by psychiatry a few times throughout admission -  5/12: venlafaxine increased and klonopin started at 0.25 mg BID schedud (POC note) 5/13: klonopin increased to 0.5 mg BID, hydroxyzine trialled 5/14: no med changes 5/16: no med changes, psych signed off Interim: Klonopin increased to 1 TID, buspirone added 5/26: psych stopped buspirone (started by primary), decreased klonopin, started PRN olanzapine 5/27-5/28: olanzapine stopped, scheduled klonpin stopped.   Patient Report:  Patient seen in corner of room looking out of window. Initially fairly tearful, did brighten through interview and laughed at appropriate points. Discussed relative failure of olanzapine (pt felt dissociated, almost akathetic per nursing description) - discussed that this is a known but fairly uncommon side effect (had focused on common/dangerous side effects as pt gets overwhelmed with too many negative possibilities which she had agreed with.  She feels caught between relief that her pregnancy is over (and she had her tubes removed) and disappointment/loss that she was not able to keep Krystal [name] inside her for longer. Hoping her milk comes in soon - baby #1 breastfed until about 3 and baby #2 got pumped milk due to poor latch - historically this has made her feel much better about preemies. Focused on evidence that she is a "good mom" by the choices she makes (such as breastfeeding) to counteract feeling that she is insufficient or a bad mom because of her multiple preterm deliveries. Hoping to engage in aftercare - open to discussing support groups with SW tomorrow. She feels like she needs "one thing off my plate" (talking about father's death, illness, preemie, CHD, etc) - looking forward to magnesium infusion finishing and feeling more normal.    ROS:  No SI, HI, AH/VH - sleep all over the place d/t preciptious section, appetite improving with less nausea - hoping it goes away after Mg+ finished.    Psychiatric/social/family/medical/surgical history  See prior consult notes Medications:   Current Facility-Administered Medications:    acetaminophen (TYLENOL) tablet 1,000 mg, 1,000 mg, Oral, Q6H, Donnamae Jude, MD, 1,000 mg at 07/22/21 0746   albuterol (VENTOLIN HFA) 108 (90 Base) MCG/ACT inhaler 2 puff, 2 puff, Inhalation, Q6H PRN, Donnamae Jude, MD   amLODipine (NORVASC) tablet 10 mg, 10 mg, Oral, Daily, Donnamae Jude, MD, 10 mg at  07/22/21 0912   coconut oil, 1 application., Topical, PRN, Donnamae Jude, MD   witch hazel-glycerin (TUCKS) pad 1 application., 1 application., Topical, PRN **AND** dibucaine (NUPERCAINAL) 1 % rectal ointment 1 application., 1 application., Rectal, PRN, Donnamae Jude, MD   diphenhydramine (BENADRYL) 12.80m/5ml 289m DYE FREE, 25 mg, Oral, Q6H PRN, PrDonnamae JudeMD   hydrALAZINE (APRESOLINE) injection 5 mg, 5 mg, Intravenous, PRN **AND** hydrALAZINE (APRESOLINE) injection 10 mg, 10 mg, Intravenous,  PRN **AND** labetalol (NORMODYNE) injection 20 mg, 20 mg, Intravenous, PRN **AND** labetalol (NORMODYNE) injection 40 mg, 40 mg, Intravenous, PRN **AND** Measure blood pressure, , , Once, PrDonnamae JudeMD   hydrALAZINE (APRESOLINE) tablet 50 mg, 50 mg, Oral, Q8H, DuRadene GunningMD   HYDROmorphone (DILAUDID) injection 0.2-0.6 mg, 0.2-0.6 mg, Intravenous, Q2H PRN, PrDonnamae JudeMD, 0.6 mg at 07/22/21 1226   ketorolac (TORADOL) 30 MG/ML injection 30 mg, 30 mg, Intravenous, Q6H, 30 mg at 07/22/21 1113 **FOLLOWED BY** [START ON 07/23/2021] ibuprofen (ADVIL) tablet 600 mg, 600 mg, Oral, Q6H, PrDonnamae JudeMD   lactated ringers infusion, , Intravenous, Continuous, DuRadene GunningMD, Last Rate: 75 mL/hr at 07/21/21 2302, Rate Change at 07/21/21 2302   LORazepam (ATIVAN) tablet 2 mg, 2 mg, Oral, Q6H PRN, DuRadene GunningMD, 2 mg at 07/22/21 1125   magnesium sulfate 40 grams in SWI 1000 mL OB infusion, 2 g/hr, Intravenous, Continuous, DuRadene GunningMD   measles, mumps & rubella vaccine (MMR) injection 0.5 mL, 0.5 mL, Subcutaneous, Once, PrKennon RoundsTaStandley DakinsMD   menthol-cetylpyridinium (CEPACOL) lozenge 3 mg, 1 lozenge, Oral, Q2H PRN, PrDonnamae JudeMD   ondansetron (ZOFRAN-ODT) disintegrating tablet 8 mg, 8 mg, Oral, Q8H, 8 mg at 07/22/21 0746 **OR** [DISCONTINUED] ondansetron (ZOFRAN) 4 MG/5ML solution 8 mg, 8 mg, Oral, Q8H, Ozan, Jennifer, DO, 8 mg at 07/19/21 [id number] oxyCODONE (Oxy IR/ROXICODONE) immediate release tablet 5-10 mg, 5-10 mg, Oral, Q4H PRN, PrDonnamae JudeMD, 10 mg at 07/22/21 0746   polyethylene glycol (MIRALAX / GLYCOLAX) packet 17 g, 17 g, Oral, Daily, DuRadene GunningMD, 17 g at 07/22/21 0912   prenatal multivitamin tablet 1 tablet, 1 tablet, Oral, Q1200, PrDonnamae JudeMD, 1 tablet at 07/22/21 1113   promethazine (PHENERGAN) 12.5 mg in sodium chloride 0.9 % 50 mL IVPB, 12.5 mg, Intravenous, Q6H PRN, PrDonnamae JudeMD   simethicone (MYLICON) chewable tablet 80 mg, 80 mg, Oral, TID PC & HS,  PrDonnamae JudeMD, 80 mg at 07/22/21 0912   simethicone (MYLICON) chewable tablet 80 mg, 80 mg, Oral, PRN, PrDonnamae JudeMD   Tdap (BOOSTRIX) injection 0.5 mL, 0.5 mL, Intramuscular, Once, PrDonnamae JudeMD   thiamine tablet 100 mg, 100 mg, Oral, Daily, PrDonnamae JudeMD, 100 mg at 07/22/21 0912   venlafaxine XR (EFFEXOR-XR) 24 hr capsule 225 mg, 225 mg, Oral, Q breakfast, DuRadene GunningMD, 225 mg at 07/22/21 0747   zolpidem (AMBIEN) tablet 5 mg, 5 mg, Oral, QHS PRN, PrDonnamae JudeMD  Allergies: Allergies  Allergen Reactions   Red Dye Other (See Comments)    ALL CAUSE PROBLEMS #40 IS WORST-MIGRAINES    Compazine [Prochlorperazine Maleate] Other (See Comments)    TWITCHING, CANT STAY STILL. Pt states she can tolerate promethazine   Other Other (See Comments)    FANSIDAR FOR MALARIA-ASTHMA   Vicodin [Hydrocodone-Acetaminophen] Itching, Nausea And Vomiting and Other (See Comments)    Pt states she can tolerate acetaminophen  Amitriptyline Hcl Other (See Comments)    Angry moods   Duloxetine Hcl Other (See Comments)    Passing out, trimble, irritable   Reglan [Metoclopramide] Other (See Comments)    restless   Nifedipine Nausea And Vomiting and Other (See Comments)    Discussed with patient and maternal fetal medicine, patient has intolerance but not frank allergy.       Objective  Vital signs:  Temp:  [97.1 F (36.2 C)-98.3 F (36.8 C)] 97.9 F (36.6 C) (05/29 1250) Pulse Rate:  [76-95] 95 (05/29 1250) Resp:  [14-21] 20 (05/29 1352) BP: (128-168)/(77-94) 156/92 (05/29 1250) SpO2:  [96 %-100 %] 100 % (05/29 1256)  Psychiatric Specialty Exam:  Presentation  General Appearance: Appropriate for Environment; Casual  Eye Contact:Good  Speech:Clear and Coherent  Speech Volume:Normal  Handedness:Right   Mood and Affect  Mood:-- (Sad, overwhelmed)  Affect:Full Range (appropriate to context of conversation)   Thought Process  Thought Processes:Coherent;  Linear  Descriptions of Associations:Intact  Orientation:Full (Time, Place and Person)  Thought Content:Logical  History of Schizophrenia/Schizoaffective disorder:No  Duration of Psychotic Symptoms:No data recorded Hallucinations:Hallucinations: None  Ideas of Reference:None  Suicidal Thoughts:Suicidal Thoughts: No  Homicidal Thoughts:Homicidal Thoughts: No   Sensorium  Memory:Immediate Fair; Recent Good; Remote Good (weekend is a blur)  Judgment:Fair  Insight:Good   Executive Functions  Concentration:Fair  Attention Span:Fair  Turner   Psychomotor Activity  Psychomotor Activity:Psychomotor Activity: Normal   Assets  Assets:Communication Skills; Desire for Improvement; Financial Resources/Insurance   Sleep  Sleep:Sleep: Poor    Physical Exam: Physical Exam HENT:     Head: Normocephalic.     Nose:     Comments: Coretrak in place - pt has secured with her crochet yarn Pulmonary:     Effort: Pulmonary effort is normal.  Musculoskeletal:     Comments: Missing L hand  Neurological:     Mental Status: She is alert and oriented to person, place, and time.  Psychiatric:     Comments: Tearful,     Blood pressure (!) 156/92, pulse 95, temperature 97.9 F (36.6 C), temperature source Oral, resp. rate 20, height '5\' 11"'  (1.803 m), weight 113.3 kg, last menstrual period 01/19/2021, SpO2 100 %, not currently breastfeeding. Body mass index is 34.84 kg/m.

## 2021-07-22 NOTE — Progress Notes (Signed)
Post-Op Day 1 s/p RLTCS/BTS for HELLP Syndrome  Subjective: voiding, tolerating PO, and pain not well controlled but had only been given Toradol thus far.   Objective: Blood pressure (!) 157/90, pulse 85, temperature 98.1 F (36.7 C), temperature source Oral, resp. rate 16, height '5\' 11"'$  (1.803 m), weight 113.3 kg, last menstrual period 01/19/2021, SpO2 98 %, not currently breastfeeding.  Physical Exam:  General: alert, cooperative, and no distress Lochia: appropriate Uterine Fundus: firm Incision: dressing c/d/I  DVT Evaluation: Calf/Ankle edema is present.     Latest Ref Rng & Units 07/22/2021    5:18 AM 07/21/2021    3:10 PM 07/21/2021   10:13 AM  CMP  Glucose 70 - 99 mg/dL 126   92   89    BUN 6 - 20 mg/dL '15   8   10    '$ Creatinine 0.44 - 1.00 mg/dL 0.87   0.74   0.66    Sodium 135 - 145 mmol/L 134   131   137    Potassium 3.5 - 5.1 mmol/L 4.6   4.2   4.0    Chloride 98 - 111 mmol/L 102   101   106    CO2 22 - 32 mmol/L '21   20   20    '$ Calcium 8.9 - 10.3 mg/dL 7.7   8.4   9.2    Total Protein 6.5 - 8.1 g/dL 5.1   5.3   5.5    Total Bilirubin 0.3 - 1.2 mg/dL 0.5   1.1   0.6    Alkaline Phos 38 - 126 U/L 54   64   63    AST 15 - 41 U/L 330   590   98    ALT 0 - 44 U/L 319   366   93        Latest Ref Rng & Units 07/22/2021    5:18 AM 07/21/2021    3:10 PM 07/21/2021   10:13 AM  CBC  WBC 4.0 - 10.5 K/uL 15.8   12.2   11.6    Hemoglobin 12.0 - 15.0 g/dL 9.8   10.4   9.9    Hematocrit 36.0 - 46.0 % 26.9   29.6   27.8    Platelets 150 - 400 K/uL 83   64   137       Assessment/Plan: Postpartum - Contraception: - MOF: bottle - Rh status: Rh pos - Rubella status: Rubella immune - Dispo: Most likely d/c on POD3 - Consults: Psych  HELLP  - Improving on labs today, platets 83, AST 330 as noted above - Continue Magnesium until 1800 today - UOP excellent - was about 250cc/hr prior to foley d/c - Recheck labs tomorrow but then will stop - Lovenox held at this time, can  likely resume tomorrow - continue SCDs for VTE prophylaxis - Will start Lasix   CHTN - Prior to delivery was on: Norvasc 10, Hydral 100q8, Labetalol 600q8 - Current regimen: Norvasc 10, hydral 50q8 - just increased hydral this morning for Bps 150s/90s. Will titrate up  Psych - Continue Venlafaxine, ativan, and zyprexa  Neonatal - In the NICU    LOS: 20 days   Radene Gunning 07/22/2021, 7:57 AM

## 2021-07-23 LAB — CBC
HCT: 24.1 % — ABNORMAL LOW (ref 36.0–46.0)
Hemoglobin: 8.3 g/dL — ABNORMAL LOW (ref 12.0–15.0)
MCH: 32.8 pg (ref 26.0–34.0)
MCHC: 34.4 g/dL (ref 30.0–36.0)
MCV: 95.3 fL (ref 80.0–100.0)
Platelets: 104 10*3/uL — ABNORMAL LOW (ref 150–400)
RBC: 2.53 MIL/uL — ABNORMAL LOW (ref 3.87–5.11)
RDW: 13.4 % (ref 11.5–15.5)
WBC: 15.1 10*3/uL — ABNORMAL HIGH (ref 4.0–10.5)
nRBC: 0 % (ref 0.0–0.2)

## 2021-07-23 LAB — COMPREHENSIVE METABOLIC PANEL
ALT: 212 U/L — ABNORMAL HIGH (ref 0–44)
AST: 117 U/L — ABNORMAL HIGH (ref 15–41)
Albumin: 2.9 g/dL — ABNORMAL LOW (ref 3.5–5.0)
Alkaline Phosphatase: 56 U/L (ref 38–126)
Anion gap: 10 (ref 5–15)
BUN: 17 mg/dL (ref 6–20)
CO2: 23 mmol/L (ref 22–32)
Calcium: 7.4 mg/dL — ABNORMAL LOW (ref 8.9–10.3)
Chloride: 106 mmol/L (ref 98–111)
Creatinine, Ser: 0.94 mg/dL (ref 0.44–1.00)
GFR, Estimated: >60 mL/min (ref >60)
Glucose, Bld: 128 mg/dL — ABNORMAL HIGH (ref 70–99)
Potassium: 3.5 mmol/L (ref 3.5–5.1)
Sodium: 139 mmol/L (ref 135–145)
Total Bilirubin: 0.2 mg/dL — ABNORMAL LOW (ref 0.3–1.2)
Total Protein: 5.4 g/dL — ABNORMAL LOW (ref 6.5–8.1)

## 2021-07-23 MED ORDER — FUROSEMIDE 20 MG PO TABS
20.0000 mg | ORAL_TABLET | Freq: Every day | ORAL | Status: DC
  Administered 2021-07-23 – 2021-07-24 (×2): 20 mg via ORAL
  Filled 2021-07-23 (×2): qty 1

## 2021-07-23 MED ORDER — LISINOPRIL 10 MG PO TABS
5.0000 mg | ORAL_TABLET | Freq: Every day | ORAL | Status: DC
  Administered 2021-07-23 – 2021-07-24 (×2): 5 mg via ORAL
  Filled 2021-07-23 (×2): qty 1

## 2021-07-23 MED ORDER — ENOXAPARIN SODIUM 60 MG/0.6ML IJ SOSY
50.0000 mg | PREFILLED_SYRINGE | INTRAMUSCULAR | Status: DC
  Administered 2021-07-23 – 2021-07-24 (×2): 50 mg via SUBCUTANEOUS
  Filled 2021-07-23 (×2): qty 0.6

## 2021-07-23 NOTE — Lactation Note (Signed)
This note was copied from a baby's chart.  NICU Lactation Consultation Note  Patient Name: Krystal [name] Today's Date: 07/23/2021 Age:33 hours   Subjective Reason for consult: Initial assessment; NICU baby; Preterm <34wks; Infant < 6lbs  Lactation conducted initial consult. Ms. Leng began pumping on 5/29. She has experience with pumping with two previous preterm children, one of whom was born at [facility], and the other at [facility].  She is familiar with the effects of magnesium therapy on lactogenesis II.  I reviewed pumping basics and fitted her with a hands free pumping top. We discussed at home pump options. Ms. Haskin would like a WIC pump, if possible. I offered to place a referral today. We will check back in tomorrow regarding the response from [facility], and if need be, we can place a Stork pump. Ms. Dibbern, at this time, would prefer a different pump to the  pump option.  Ms. Pine has some colostrum pumped and collected with a syringe. We discussed oral care.  She requested some labels for her breastmilk.  She requests a pumping consult on 6/1. I placed that follow up on the calendar.   Objective   Maternal data: [id number]  C-Section, Low Transverse  Current breast feeding challenges:: NICU; preterm  Previous breastfeeding challenges?: Other (Comment) (NICU; preterm; pumped)  Does the patient have breastfeeding experience prior to this delivery?: Yes  Pumping frequency: rec q3 hours Pumped volume: 2 mL (several cc's) Flange Size: 24   WIC Program: Yes WIC Referral Sent?: Yes Pump: Personal (referred to [facility]; undecided regarding Stork Pump - may call insurance to seek other options; has several used pumps at home)  Assessment  Feeding Status: NPO   Maternal: Milk volume: Normal   Intervention/Plan Interventions: Breast feeding basics reviewed; DEBP; Education; "The NICU and Your Baby" book; [facility] brochure  Tools: Pump; Hands-free  pumping top; Flanges Pump Education: Setup, frequency, and cleaning; Milk Storage  Plan: Consult Status: Follow-up  NICU Follow-up type: New admission follow up    Lenore Manner 07/23/2021, 10:52 AM

## 2021-07-23 NOTE — Discharge Instructions (Signed)
[facility] Email: [email or web address] Ph#: [phone] 10hrs/week in-person or virtual therapy [employer]

## 2021-07-23 NOTE — Progress Notes (Signed)
POSTPARTUM PROGRESS NOTE  POD #2  Subjective:  Krystal [name] is a 31 y.o. [id number] s/p rLTCS and bilateral salpingectomy at [redacted]w[redacted]d Today she notes she is feeling better. She denies any problems with ambulating, voiding or po intake. Denies recent episode of emesis.  Reports she has been able to keep clears down without issues. She has passed flatus, +BM.  Pain is well controlled.  Lochia minimal Denies fever/chills/chest pain/SOB.  no HA, no blurry vision, no RUQ pain  Objective: Blood pressure (!) 152/82, pulse 77, temperature 98.2 F (36.8 C), temperature source Oral, resp. rate 18, height '5\' 11"'$  (1.803 m), weight 113.3 kg, last menstrual period 01/19/2021, SpO2 100 %, not currently breastfeeding. BP range: 139-161/75-92  Physical Exam:  General: alert, cooperative and no distress Chest: no respiratory distress, CTAB Heart: regular rate and rhythm Abdomen: soft, nontender, +BS Uterine Fundus: firm, appropriately tender Incision: clean and dry intact  DVT Evaluation: No calf swelling or tenderness Extremities: minimal edema Skin: warm, dry  Results for orders placed or performed during the hospital encounter of 07/02/21 (from the past 24 hour(s))  CBC     Status: Abnormal   Collection Time: 07/23/21  4:13 AM  Result Value Ref Range   WBC 15.1 (H) 4.0 - 10.5 K/uL   RBC 2.53 (L) 3.87 - 5.11 MIL/uL   Hemoglobin 8.3 (L) 12.0 - 15.0 g/dL   HCT 24.1 (L) 36.0 - 46.0 %   MCV 95.3 80.0 - 100.0 fL   MCH 32.8 26.0 - 34.0 pg   MCHC 34.4 30.0 - 36.0 g/dL   RDW 13.4 11.5 - 15.5 %   Platelets 104 (L) 150 - 400 K/uL   nRBC 0.0 0.0 - 0.2 %    Assessment/Plan: Krystal [name] is a 31y.o. [id number]/p repeat LTCS, bilateral salpingectomy at 284w3dOD#2 complicated by: 1) HELLP with  cHTN -labs improving -pt currently asymptomatic -current BP meds: Norvasc 10, Hydralazine 100q8, add Lisinopril '5mg'$  daily today, will continue to monitor BP -Lasix '20mg'$  daily  2) Postpartum -pain  controlled with oral medication -meeting postop milestones appropriately -Lovenox daily for DVT prophylaxis -plan to remove feeding tube and advance diet as tolerated  3) Psych -continue current meds -seen by psychology   Contraception: bilateral salpingectomy Feeding: baby in NICU  Dispo: Continue with care as outlined above, pending BP management, possible discharge home tomorrw   LOS: 21 days   JeJanyth PupaDO Faculty Attending, [facility]/30/2023, 9:36 AM

## 2021-07-23 NOTE — Consult Note (Signed)
[facility] Followup Face-to-Face Psychiatric Evaluation   Service Date: Jul 23, 2021 LOS:  LOS: 21 days    Assessment  Krystal [name] is a 31 y.o. female admitted medically for 07/02/2021  1:45 PM for  uncontrolled HTN, N/V in pregnancy complicated by tetrology of fallot. She carries the psychiatric diagnoses of depression and anxiety and has a past medical history of asthma, IC, kidney stones, migraines, necrotizing fasciitis, HTN, - prior pregnancies complicated by HELLP and preterm delivery. Psychiatry was reconsulted for same by Dr. Rip Harbour.   Team familiar with this pt - depression and anxiety complicated by pregnancy and hyperemesis; fairly complicated social situation detailed elsewhere.   This has been a challenging case - in perinatal psychiatry the goal is to fully treat the patient's symptoms with the least # of medications to minimize effects on the fetus. Had increased venlafaxine, tried and escalted BZD, added buspirone and olanzapine with little effect. Unfortunately, pt's symptoms did not improve on fairly high doses of multiple medications - late in week of 5/22 there was some concern that escalating BZD doses are disinhibiting pt more so than they were treating anxiety; shifted recommendations at that point to de-escalate medication regimen and remove meds (as risk > benefit if no observed benefit); pt now on venlafaxine and lorazepam which should be able to taper off over next few days.   5/29: Unfortunately baby delivered over the weekend and is in NICU. It looks like scheduled klonopin and PRN zyprexa stopped by primary team, which was going to be the plan for today. On BZD total for ~2 weeks, should be able to self-taper with PRN ativan. Bulk of interview spent in supportive psychotherapy with theme of validating pt's emotional response to current circumstance and separating the idea of "bad at being pregnant" from being a bad mother.   5/30: Pt with improvement  in affect/ability to process information without spiraling/focusing on negative outcomes. Discussed recommendations outlined below. Psychiatry signing off. Addendum: also discussed high risk of ppd on 5/30   Diagnoses:  Active Hospital problems: Principal Problem:   Intractable nausea and vomiting Active Problems:   Anxiety   History of HELLP syndrome, currently pregnant   Posttraumatic stress disorder   Essential hypertension   MDD (major depressive disorder), recurrent episode, severe (HCC)   History of preterm delivery, currently pregnant   Attention deficit disorder   IUGR, antenatal   Anisocoria   Chronic hypertension with exacerbation during pregnancy in second trimester   Poor fetal growth affecting management of mother in second trimester   Headache in pregnancy, antepartum, second trimester   [redacted] weeks gestation of pregnancy   Status post repeat low transverse cesarean section     Plan  ## Safety and Observation Level:  - Based on my clinical evaluation, I estimate the patient to be at low risk of self harm in the current setting - At this time, we recommend a routine level of observation - chaplain had recommended companion sitter which    ## Medications: Today:   -- CONTINUE venlafaxine 225 mg for depression, anxiety, etc -- CONTINUE lorazepam PO 2 mg q6h - would give pt ~10 doses at time of discharge. Has not been on long enough to develop dependence/dangerous withdrawals. Using less (1-2x/day) since delivery.   Rest of prior meds trialled unsuccessfully this admission stopped either by psychiatry or primary team.   ## Medical Decision Making Capacity:  Not formally assessed  ## Further Work-up:   -- Qtc 5/4  had QtC of 438, 4/26 437, 5/29 434 -- Pertinent labwork reviewed earlier this admission includes: TSH wnl  ## Disposition:  -- home - will need outpt counselling and psych f/u - coordinate with SW after federal holiday -- added [facility] partial  program to AVS and wrote out contact info for patient.   ## Behavioral / Environmental:  -- Please make effort to frame discussions in terms of "best case, most likely case, worst case" as pt has tendency to focus on worst case scenarios    Thank you for this consult request. Recommendations have been communicated to the primary team.  We will continue to follow at this time.   [place] [facility] Almer [place]   New history  Relevant Aspects of Hospital Course:  Admitted on 07/02/2021 for HTN and N/V in pregnancy. Had been seen by psychiatry a few times throughout admission -  5/12: venlafaxine increased and klonopin started at 0.25 mg BID schedud (POC note) 5/13: klonopin increased to 0.5 mg BID, hydroxyzine trialled 5/14: no med changes 5/16: no med changes, psych signed off Interim: Klonopin increased to 1 TID, buspirone added 5/26: psych stopped buspirone (started by primary), decreased klonopin, started PRN olanzapine 5/27-5/28: olanzapine stopped, scheduled klonpin stopped.  5/29-5/30: no med changes  Patient Report:  Patient seen in late AM. Friend at bedside present through visit. Was looking through therapy/aftercare resources provided by SW at start of visit and was open to [facility] as well. Overall affect much brighter - pt is physically feeling much better (off Mag). Saw [name] and is heartened by her doing much better than anticipated. Again validated decision to sterilize in setting of multiple life-threatening pregnancies. Looking forward to step back into "mom" role with older 2 daughters. Discussed final recs, signing off.    ROS:  No SI, HI, AH/VH - sleep a little better, appetite a little better.    Psychiatric/social/family/medical/surgical history  See prior consult notes Medications:   Current Facility-Administered Medications:    acetaminophen (TYLENOL) tablet 650 mg, 650 mg, Oral, Q6H, Chancy Milroy, MD, 650 mg at 07/23/21 2003   albuterol (VENTOLIN HFA)  108 (90 Base) MCG/ACT inhaler 2 puff, 2 puff, Inhalation, Q6H PRN, Donnamae Jude, MD   amLODipine (NORVASC) tablet 10 mg, 10 mg, Oral, Daily, Donnamae Jude, MD, 10 mg at 07/23/21 0927   coconut oil, 1 application., Topical, PRN, Donnamae Jude, MD   witch hazel-glycerin (TUCKS) pad 1 application., 1 application., Topical, PRN **AND** dibucaine (NUPERCAINAL) 1 % rectal ointment 1 application., 1 application., Rectal, PRN, Donnamae Jude, MD   diphenhydramine (BENADRYL) 12.74m/5ml 221m DYE FREE, 25 mg, Oral, Q6H PRN, PrDonnamae JudeMD   enoxaparin (LOVENOX) injection 50 mg, 50 mg, Subcutaneous, Q24H, DuRadene GunningMD, 50 mg at 07/23/21 [id number] furosemide (LASIX) tablet 20 mg, 20 mg, Oral, Daily, Ozan, Jennifer, DO, 20 mg at 07/23/21 1010   hydrALAZINE (APRESOLINE) injection 5 mg, 5 mg, Intravenous, PRN **AND** hydrALAZINE (APRESOLINE) injection 10 mg, 10 mg, Intravenous, PRN **AND** labetalol (NORMODYNE) injection 20 mg, 20 mg, Intravenous, PRN **AND** labetalol (NORMODYNE) injection 40 mg, 40 mg, Intravenous, PRN **AND** Measure blood pressure, , , Once, PrDonnamae JudeMD   hydrALAZINE (APRESOLINE) tablet 100 mg, 100 mg, Oral, Q8H, DuRadene GunningMD, 100 mg at 07/23/21 2208   [COMPLETED] ketorolac (TORADOL) 30 MG/ML injection 30 mg, 30 mg, Intravenous, Q6H, 30 mg at 07/22/21 1755 **FOLLOWED BY** ibuprofen (ADVIL) tablet 600 mg, 600 mg, Oral, Q6H, PrDarron Doom  S, MD, 600 mg at 07/23/21 1757   lactated ringers infusion, , Intravenous, Continuous, Radene Gunning, MD, Stopped at 07/22/21 1755   lisinopril (ZESTRIL) tablet 5 mg, 5 mg, Oral, Daily, Ozan, Jennifer, DO, 5 mg at 07/23/21 1010   LORazepam (ATIVAN) tablet 2 mg, 2 mg, Oral, Q6H PRN, Radene Gunning, MD, 2 mg at 07/23/21 1523   measles, mumps & rubella vaccine (MMR) injection 0.5 mL, 0.5 mL, Subcutaneous, Once, Kennon Rounds, Standley Dakins, MD   menthol-cetylpyridinium (CEPACOL) lozenge 3 mg, 1 lozenge, Oral, Q2H PRN, Donnamae Jude, MD   ondansetron  (ZOFRAN-ODT) disintegrating tablet 8 mg, 8 mg, Oral, Q8H, 8 mg at 07/23/21 1645 **OR** [DISCONTINUED] ondansetron (ZOFRAN) 4 MG/5ML solution 8 mg, 8 mg, Oral, Q8H, Ozan, Jennifer, DO, 8 mg at 07/19/21 1694   oxyCODONE (Oxy IR/ROXICODONE) immediate release tablet 5-10 mg, 5-10 mg, Oral, Q4H PRN, Donnamae Jude, MD, 10 mg at 07/23/21 2208   polyethylene glycol (MIRALAX / GLYCOLAX) packet 17 g, 17 g, Oral, Daily, Radene Gunning, MD, 17 g at 07/23/21 5038   prenatal multivitamin tablet 1 tablet, 1 tablet, Oral, Q1200, Donnamae Jude, MD, 1 tablet at 07/23/21 1114   promethazine (PHENERGAN) 12.5 mg in sodium chloride 0.9 % 50 mL IVPB, 12.5 mg, Intravenous, Q6H PRN, Donnamae Jude, MD, Last Rate: 200 mL/hr at 07/22/21 2116, 12.5 mg at 07/22/21 2116   simethicone (MYLICON) chewable tablet 80 mg, 80 mg, Oral, TID PC & HS, Donnamae Jude, MD, 80 mg at 07/23/21 2207   simethicone (MYLICON) chewable tablet 80 mg, 80 mg, Oral, PRN, Donnamae Jude, MD   Tdap (BOOSTRIX) injection 0.5 mL, 0.5 mL, Intramuscular, Once, Donnamae Jude, MD   thiamine tablet 100 mg, 100 mg, Oral, Daily, Donnamae Jude, MD, 100 mg at 07/23/21 8828   venlafaxine XR (EFFEXOR-XR) 24 hr capsule 225 mg, 225 mg, Oral, Q breakfast, Radene Gunning, MD, 225 mg at 07/23/21 0800   zolpidem (AMBIEN) tablet 5 mg, 5 mg, Oral, QHS PRN, Donnamae Jude, MD, 5 mg at 07/22/21 2207  Allergies: Allergies  Allergen Reactions   Red Dye Other (See Comments)    ALL CAUSE PROBLEMS #40 IS WORST-MIGRAINES    Compazine [Prochlorperazine Maleate] Other (See Comments)    TWITCHING, CANT STAY STILL. Pt states she can tolerate promethazine   Other Other (See Comments)    FANSIDAR FOR MALARIA-ASTHMA   Vicodin [Hydrocodone-Acetaminophen] Itching, Nausea And Vomiting and Other (See Comments)    Pt states she can tolerate acetaminophen   Amitriptyline Hcl Other (See Comments)    Angry moods   Duloxetine Hcl Other (See Comments)    Passing out, trimble, irritable    Reglan [Metoclopramide] Other (See Comments)    restless   Nifedipine Nausea And Vomiting and Other (See Comments)    Discussed with patient and maternal fetal medicine, patient has intolerance but not frank allergy.       Objective  Vital signs:  Temp:  [97.8 F (36.6 C)-98.3 F (36.8 C)] 97.9 F (36.6 C) (05/30 2025) Pulse Rate:  [76-99] 86 (05/30 2025) Resp:  [16-18] 16 (05/30 2025) BP: (139-167)/(78-96) 156/90 (05/30 2025) SpO2:  [98 %-100 %] 100 % (05/30 2025)  Psychiatric Specialty Exam:  Presentation  General Appearance: Appropriate for Environment; Casual  Eye Contact:Good  Speech:Clear and Coherent  Speech Volume:Normal  Handedness:Right   Mood and Affect  Mood:-- (A little better)  Affect:Full Range   Thought Process  Thought Processes:Coherent; Linear  Descriptions of Associations:Intact  Orientation:Full (Time, Place and Person)  Thought Content:Logical  History of Schizophrenia/Schizoaffective disorder:No  Duration of Psychotic Symptoms:No data recorded Hallucinations:Hallucinations: None  Ideas of Reference:None  Suicidal Thoughts:Suicidal Thoughts: No  Homicidal Thoughts:Homicidal Thoughts: No   Sensorium  Memory:Immediate Fair; Recent Good; Remote Good  Judgment:Fair  Insight:Good   Executive Functions  Concentration:Fair  Attention Span:Fair  [place]  Language:Good   Psychomotor Activity  Psychomotor Activity:Psychomotor Activity: Normal   Assets  Assets:Communication Skills; Desire for Improvement; Financial Resources/Insurance; Social Support   Sleep  Sleep:Sleep: Fair    Physical Exam: Physical Exam HENT:     Head: Normocephalic.     Nose:     Comments: Coretrak out! Pulmonary:     Effort: Pulmonary effort is normal.  Musculoskeletal:     Comments: Missing L hand  Neurological:     Mental Status: She is alert and oriented to person, place, and time.  Psychiatric:      Comments: Affect much brighter.     Blood pressure (!) 156/90, pulse 86, temperature 97.9 F (36.6 C), temperature source Oral, resp. rate 16, height '5\' 11"'  (1.803 m), weight 113.3 kg, last menstrual period 01/19/2021, SpO2 100 %, not currently breastfeeding. Body mass index is 34.84 kg/m.

## 2021-07-23 NOTE — Progress Notes (Signed)
Follow up visit with Krystal [name] after the delivery of her daughter, Krystal [name], who is a NICU patient. Krystal [name] shared her relief that Krystal [name]'s heart condition is better than the team feared. She shared her conviction that sterilization was the right choice regardless as she recognizes that her two daughters and husband need her. Krystal [name] continues to explore feelings of grief and loss, both due to the death of her father, but also in the finality of a relationship that wasn't what she needed. Chaplain explored themes of grief, forgiveness, boundary setting, and hope with pt.  Will continue to follow.  Please page as further needs arise.  Donald Prose. Elyn Peers, M.Div. [facility] Chaplain Pager [phone] Office [phone]

## 2021-07-23 NOTE — Progress Notes (Signed)
RN spoke with patient about pain management. Explained that Dilaudid IV has a short time period of coverage and it would be more effective to take her PO oxycodone for pain. Patient declined and requested to take IV medication. Danie Binder, RN

## 2021-07-24 ENCOUNTER — Other Ambulatory Visit (HOSPITAL_COMMUNITY): Payer: Self-pay

## 2021-07-24 ENCOUNTER — Ambulatory Visit: Payer: Self-pay

## 2021-07-24 ENCOUNTER — Encounter (HOSPITAL_COMMUNITY): Payer: Self-pay | Admitting: Obstetrics and Gynecology

## 2021-07-24 ENCOUNTER — Ambulatory Visit: Payer: Commercial Managed Care - HMO

## 2021-07-24 LAB — SURGICAL PATHOLOGY

## 2021-07-24 MED ORDER — LISINOPRIL 5 MG PO TABS
5.0000 mg | ORAL_TABLET | Freq: Two times a day (BID) | ORAL | 3 refills | Status: DC
  Filled 2021-07-24: qty 60, 30d supply, fill #0

## 2021-07-24 MED ORDER — LORAZEPAM 1 MG PO TABS
2.0000 mg | ORAL_TABLET | Freq: Four times a day (QID) | ORAL | 0 refills | Status: DC | PRN
  Filled 2021-07-24: qty 24, 3d supply, fill #0

## 2021-07-24 MED ORDER — FUROSEMIDE 20 MG PO TABS
20.0000 mg | ORAL_TABLET | Freq: Every day | ORAL | 0 refills | Status: DC
  Filled 2021-07-24: qty 3, 3d supply, fill #0

## 2021-07-24 MED ORDER — IBUPROFEN 600 MG PO TABS
600.0000 mg | ORAL_TABLET | Freq: Four times a day (QID) | ORAL | 0 refills | Status: DC | PRN
  Filled 2021-07-24: qty 30, 8d supply, fill #0

## 2021-07-24 MED ORDER — OXYCODONE HCL 5 MG PO TABS
5.0000 mg | ORAL_TABLET | Freq: Four times a day (QID) | ORAL | 0 refills | Status: AC | PRN
  Filled 2021-07-24: qty 28, 4d supply, fill #0

## 2021-07-24 MED ORDER — SENNOSIDES 8.6 MG PO TABS
1.0000 | ORAL_TABLET | Freq: Every day | ORAL | 0 refills | Status: DC | PRN
Start: 2021-07-24 — End: 2021-08-12
  Filled 2021-07-24: qty 30, 30d supply, fill #0

## 2021-07-24 MED ORDER — VENLAFAXINE HCL ER 75 MG PO CP24
225.0000 mg | ORAL_CAPSULE | Freq: Every day | ORAL | 1 refills | Status: DC
  Filled 2021-07-24: qty 90, 30d supply, fill #0

## 2021-07-24 MED ORDER — LISINOPRIL 10 MG PO TABS: 5.0000 mg | ORAL_TABLET | Freq: Two times a day (BID) | ORAL | Status: DC

## 2021-07-24 NOTE — BH Specialist Note (Signed)
Integrated Behavioral Health via Telemedicine Visit  08/05/2021 PARKER SAWATZKY [id number]  Number of Integrated Behavioral Health Clinician visits: 3- Third Visit  Session Start time: 1962   Session End time: 2297  Total time in minutes: 35   Referring Provider: Darron Doom, MD Patient/Family location: Home [facility] Provider location: Center for [place] [facility]  All persons participating in visit: [name] and [place]   Types of Service: Individual psychotherapy and Telephone visit  I connected with Solace C Golomb and/or [facility] Don[facility]'s  n/a  via  Telephone or Video Enabled Telemedicine Application  (Video is Caregility application) and verified that I am speaking with the correct person using two identifiers. Discussed confidentiality: Yes   I discussed the limitations of telemedicine and the availability of in person appointments.  Discussed there is a possibility of technology failure and discussed alternative modes of communication if that failure occurs.  I discussed that engaging in this telemedicine visit, they consent to the provision of behavioral healthcare and the services will be billed under their insurance.  Patient and/or legal guardian expressed understanding and consented to Telemedicine visit: Yes   Presenting Concerns: Patient and/or family reports the following symptoms/concerns: Loss of father while pt in the hospital after emergency cesarean; coping with panic attacks with Ativan and acceptance of current life circumstances. Pt goal is to focus on her immediate family only (husband, children; self) at this time, and to let go of worrying about what others outside of immediate family think.  Duration of problem: Ongoing; Severity of problem: moderate  Patient and/or Family's Strengths/Protective Factors: Social connections, Concrete supports in place (healthy food, safe environments,  etc.), Sense of purpose, and Physical Health (exercise, healthy diet, medication compliance, etc.)  Goals Addressed: Patient will:  Maintain reduction of symptoms of: anxiety, depression, and stress   Increase knowledge and/or ability of: stress reduction   Demonstrate ability to:  Continue allowing healthy grieving over loss  Progress towards Goals: Ongoing  Interventions: Interventions utilized:  Supportive Reflection Standardized Assessments completed:  Not given today  Patient and/or Family Response: Patient agrees with treatment plan.   Assessment: Patient currently experiencing Grief, Major depressive disorder, recurrent, mild and PTSD  Patient may benefit from continued brief therapeutic interventions.  Plan: Follow up with behavioral health clinician on : One month; Call Starlett Pehrson at [phone], as needed. Behavioral recommendations:  -Continue taking Ativan as prescribed -Continue allowing time and space to grieve loss of father, as needed -Continue using positive self-talk and healthy self-care daily, to help cope with current life stress Referral(s): [facility] (In Clinic)  I discussed the assessment and treatment plan with the patient and/or parent/guardian. They were provided an opportunity to ask questions and all were answered. They agreed with the plan and demonstrated an understanding of the instructions.   They were advised to call back or seek an in-person evaluation if the symptoms worsen or if the condition fails to improve as anticipated.  Caroleen Hamman Helina Hullum, LCSW

## 2021-07-24 NOTE — Lactation Note (Addendum)
This note was copied from a baby's chart.  NICU Lactation Consultation Note  Patient Name: Krystal [name] NOMVE'H Date: 07/24/2021 Age:33 hours  Subjective Reason for consult: Follow-up assessment; NICU baby; Preterm <34wks; Infant < 6lbs; Maternal discharge  Visited with mom of 22 hours old pre-term NICU female, she's a P3 and reports that her supply is slowly coming in. She hasn't been pumping consistently, explained the importance of consistent pumping for the onset of lactogenesis II and to protect her supply. Ms. Santino is getting discharged today. Reviewed discharge education, tandem nursing (she breastfed her oldest one till 58 months old and nursed while pregnant with baby # 2), pumping schedule and options to get a hospital grade pump; she politely decline a Stork pump from the hospital, she was looking for a different model/style.  Objective Infant data: Mother's Current Feeding Choice: Breast Milk Infant feeding assessment In NICU  Maternal data: [id number]  C-Section, Low Transverse Current breast feeding challenges:: NICU; preterm Previous breastfeeding challenges?: Other (Comment) (NICU; preterm; pumped) Does the patient have breastfeeding experience prior to this delivery?: Yes Pumping frequency: 3-4 times/24 hours Pumped volume: 2 mL (2-3 ml) Flange Size: 27 (switched from # 24) WIC Program: Yes WIC Referral Sent?: Yes Pump: Personal (referred to [facility]; undecided regarding Stork Pump - may call insurance to seek other options; has several used pumps at home)  Assessment Infant: Feeding Status: NPO  Maternal: Milk volume: Normal  Intervention/Plan Interventions: Breast feeding basics reviewed; DEBP; Education Tools: Pump; Hands-free pumping top; Flanges; Coconut oil Pump Education: Setup, frequency, and cleaning; Milk Storage  Plan of care: Encouraged mom to start pumping consistently every 3 hours, at least 8 pumping sessions/24 hours She'll contact  the [facility] office to get a DEBP if eligible, LC provided with contact info She'll take all pump parts to baby's room  Consult Status: Follow-up NICU Follow-up type: Verify onset of copious milk; Verify absence of engorgement; Weekly NICU follow up   [employer] 07/24/2021, 2:51 PM

## 2021-07-24 NOTE — Progress Notes (Signed)
CSW acknowledged consult and attempted to meet with MOB to complete psychosocial assessment; however, MOB was about to leave and requested to meet with CSW on another day. CSW agreed to follow up with MOB in the NICU when she visits with infant.    Abundio Miu, [place] Worker [facility] Cell#: [phone]

## 2021-07-24 NOTE — Progress Notes (Signed)
C. Riley, NT to room to retake BP.  Patient asked her to come back because she was talking to her sister on the phone and did not want pause the call.  Will attempt to repeat BP in 30 minutes.

## 2021-07-24 NOTE — Progress Notes (Signed)
Patient discharged to home with husband.  Condition stable. Patient ambulated to car with Ranell Patrick, RN.  No equipment for home ordered at discharge.

## 2021-07-24 NOTE — Progress Notes (Signed)
POSTPARTUM PROGRESS NOTE  POD #3  Subjective:  Krystal [name] is a 31 y.o. [id number] s/p rLTCS  at [redacted]w[redacted]d Today she notes that she is doing well. She denies any problems with ambulating, voiding or po intake. Denies nausea or vomiting. She has passed flatus, +BM.  Pain is controlled with medication.  Lochia minimal Denies fever/chills/chest pain/SOB.  no HA, no blurry vision, no RUQ pain  Objective: Blood pressure (!) 153/113, pulse 82, temperature 98 F (36.7 C), resp. rate 16, height '5\' 11"'$  (1.803 m), weight 113.3 kg, last menstrual period 01/19/2021, SpO2 100 %, not currently breastfeeding.  Physical Exam:  General: alert, cooperative and no distress Chest: no respiratory distress Heart: regular rate and rhythm Abdomen: soft, nontender, +BS Uterine Fundus: firm, appropriately tender Incision: C/D/I with honeycomb DVT Evaluation: No calf swelling or tenderness Extremities: no edema Skin: warm, dry  No results found for this or any previous visit (from the past 24 hour(s)).  Assessment/Plan: Krystal [name] is a 31y.o. [id number]/p rLTCS at 22w4dOD#3 complicated by: 1) HELLP -currently asymptomatic -current BP meds: Norvasc 10, Hydralazine 100q8, add Lisinopril '5mg'$  daily today and IV meds prn.   -Reviewed management with her PCP, long standing difficulty controlling BP.  Advised increasing to Lisinopril '5mg'$  bid and will follow up with her daily for BP check and appt scheduled for Monday in office -continue Lasix x 5 days  2) Psych -medication per Dr. CiLovette Cliche3) Postpartum -pain controlled -Lovenox for DVT prophylaxis -meeting milestones appropriately  Contraception: bilateral salpingectomy Feeding: baby in NICU  Dispo: Meeting milestones appropriately.  While BP elevated, this is a chronic issue and will follow as outpatient.  Plan for discharge home with close outpatient follow up.   LOS: 22 days   JeJanyth PupaDO Faculty Attending, Center for  [facility]/31/2023, 9:45 AM

## 2021-07-24 NOTE — Progress Notes (Signed)
This morning BPs were:  07:51 am - 170/99 08:06 am - 153/113  Patient has chronic HTN and is PostOp day #3.  Phoned Dr. Nelda Marseille with above information.  She instructed RN to give 10:00 am dose of antihypertensives now and recheck in 30 minutes.  MD plans to ask patient about her baseline home blood pressures during morning MD rounds.

## 2021-07-26 MED FILL — Heparin Sodium (Porcine) Inj 1000 Unit/ML: INTRAMUSCULAR | Qty: 30 | Status: AC

## 2021-07-26 MED FILL — Sodium Chloride IV Soln 0.9%: INTRAVENOUS | Qty: 1000 | Status: AC

## 2021-07-26 MED FILL — Sodium Chloride Irrigation Soln 0.9%: Qty: 3000 | Status: AC

## 2021-07-29 ENCOUNTER — Ambulatory Visit (INDEPENDENT_AMBULATORY_CARE_PROVIDER_SITE_OTHER): Payer: Commercial Managed Care - HMO | Admitting: *Deleted

## 2021-07-29 VITALS — BP 142/99 | HR 71 | Ht 71.0 in | Wt 210.0 lb

## 2021-07-29 DIAGNOSIS — Z4889 Encounter for other specified surgical aftercare: Secondary | ICD-10-CM

## 2021-07-29 DIAGNOSIS — Z0130 Encounter for examination of blood pressure without abnormal findings: Secondary | ICD-10-CM

## 2021-07-29 NOTE — Progress Notes (Addendum)
Here for BP and incision check s/p repeat c/s and BTL 07/21/21, d/c  07/24/21. Had HELLP and has CHTN. Has taken Hydralazine and lisinopril today, usually takes amlodopine later in day. Denies edema or headache today. States has tension headache yesterday and it resolved with rest. States had to go to NICU today to sign permit for PICC for baby. BP today 135/93, 142/99. States also seeing her PCP today. Reported pt history and bp's today with Dr. Roselie Awkward, no new orders. Continue bp meds as prescribed and keep postpartum appointment as scheduled.  Wound CDI. Also reviewed wound care with her. Patient voices understanding. Staci Acosta

## 2021-07-30 ENCOUNTER — Ambulatory Visit: Payer: Self-pay

## 2021-07-30 NOTE — Lactation Note (Signed)
This note was copied from a baby's chart.  NICU Lactation Consultation Note  Patient Name: Krystal [name] Today's Date: 07/30/2021 Age:33 days   Subjective Reason for consult: Follow-up assessment; Mother's request; NICU baby; Preterm <34wks; Infant < 6lbs  Ms. Leontine Locket paged lactation for a pumping consult. We discussed her milk volume which is BNL. Historically, she states that she could pump up to 8 ounces/session. We discussed possible factors in her lower milk volume including maternal medications after delivery, and delay in initiation of pumping (began 24 hours+ after delivery). Ms. Woolverton also states that she spent four days on lasix; this was her first time having used this medication.  I observed her pump and endorsed correct flange fit and procedures, aside from using the initiation program.  Given her early events, I recommended that she pump q2-3 hours during the day and 3-4 hours during the night. I suggested that lactation could do some investigation of her medications and follow up when she returns on 6/8.  I also encouraged her to use the expression phase on her DEBP.  Objective Infant data: Mother's Current Feeding Choice: Breast Milk  Infant feeding assessment  Maternal data: [id number]  C-Section, Low Transverse  Current breast feeding challenges:: NICU; preterm  Previous breastfeeding challenges?: Other (Comment) (NICU; preterm)  Does the patient have breastfeeding experience prior to this delivery?: Yes  Pumping frequency: rec q3 hours Pumped volume: 15 mL (10-20) Flange Size: 27  Risk factor for low milk supply:: maternal medications may affect milk production   WIC Program: Yes WIC Referral Sent?: Yes Pump:  (WIC pump)  Assessment Infant: No data recorded Feeding Status: NPO   Intervention/Plan Interventions: Breast feeding basics reviewed; Education; DEBP  Tools: Pump; Flanges Pump Education: Setup, frequency, and  cleaning  Plan: Consult Status: NICU follow-up  NICU Follow-up type: Weekly NICU follow up    Lenore Manner 07/30/2021, 4:35 PM

## 2021-08-01 ENCOUNTER — Telehealth: Payer: Self-pay

## 2021-08-01 NOTE — Telephone Encounter (Signed)
Pt call transferred from the front office and pt reports that her incision is leaking yellow discharge, she has been having chills in the middle of the night however is unable to check temperature because she does have a thermometer.  I asked pt if she is able to come in tomorrow at 1030.  Pt stated yes and she will be here tomorrow.    Frances Nickels  08/01/21

## 2021-08-02 ENCOUNTER — Ambulatory Visit (INDEPENDENT_AMBULATORY_CARE_PROVIDER_SITE_OTHER): Payer: Commercial Managed Care - HMO | Admitting: *Deleted

## 2021-08-02 ENCOUNTER — Telehealth: Payer: Self-pay | Admitting: Allergy

## 2021-08-02 VITALS — BP 139/84 | HR 81 | Ht 71.0 in | Wt 211.4 lb

## 2021-08-02 DIAGNOSIS — Z4889 Encounter for other specified surgical aftercare: Secondary | ICD-10-CM

## 2021-08-02 MED ORDER — AMOXICILLIN-POT CLAVULANATE 875-125 MG PO TABS: 1.0000 | ORAL_TABLET | Freq: Two times a day (BID) | ORAL | 0 refills | Status: DC

## 2021-08-02 MED ORDER — PREDNISONE 20 MG PO TABS: 20.0000 mg | ORAL_TABLET | Freq: Every day | ORAL | 0 refills | Status: DC

## 2021-08-02 NOTE — Progress Notes (Signed)
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  Baby currently in NICU. She reports occasional headaches and takes Ibuprofen prn.  She feels they may be related to allergies because they are different from headache w/HELLP.  She denies visual disturbances. BP today - 139/84. She has not yet taken BP meds (Amlodipine, Lisinopril & Apresoline)  today. Pt states she has been having chills and breaking out in sweats for the past 4 days. She does not have a thermometer so does not know if she has had elevated temperature. Temp today - 98.3. that she had observed some drainage from an area on the Lt side of her incision yesterday. Incision was assessed and found to be healing well. No bleeding or swelling was noted. One small opening @ Lt edge of incision noted to have scant amount of serous drainage when pressure applied.  Dr. ARoselie Awkwardin to examine pt.  Proper hygiene instructions and cleansing of incision was discussed. Pt has PP appt on 08/29/21.

## 2021-08-02 NOTE — Telephone Encounter (Signed)
Per provider notation:  Sounds like an sinus infection.  Would recommend antibiotic at this time with sinus pressure, teeth pain and decreased ability for eat/drink.  Would take Augmentin '875mg'$  1 tab twice a day x 10 days.  If she would like would also do prednisone '20mg'$  1 tab x 5 days to help with the sinus pressure/teeth pain and inability to eat/drink.  If symptoms worsen she needs to go to UC/ED to be seen.      Called patient - DOB/Pharmacy verified -advised of above provider notation. Patient verbalized understanding, no further questions.  Electronically sent in above prescriptions to pharmacy on file.

## 2021-08-02 NOTE — Telephone Encounter (Signed)
Please advise since Dr. Maudie Mercury is out of the office today.

## 2021-08-02 NOTE — Telephone Encounter (Signed)
Patient called and stated that her teeth are hurting due to sinus pressure. She is unable to eat or drink at this point. She has been taking Zyrtec, but it isnt helping. Patient has an appointment with Dr Maudie Mercury on Monday, June 19th but would like a call back to see what she can do in the meantime. Patient is breast feeding and not sure what she is able to take. Please advise. Patient states she can be called at [phone] or contacted on Mychart.

## 2021-08-05 ENCOUNTER — Ambulatory Visit: Payer: Commercial Managed Care - HMO | Admitting: Clinical

## 2021-08-05 ENCOUNTER — Encounter: Payer: Managed Care, Other (non HMO) | Admitting: Obstetrics and Gynecology

## 2021-08-05 ENCOUNTER — Other Ambulatory Visit: Payer: Managed Care, Other (non HMO)

## 2021-08-05 DIAGNOSIS — F330 Major depressive disorder, recurrent, mild: Secondary | ICD-10-CM

## 2021-08-05 DIAGNOSIS — F4321 Adjustment disorder with depressed mood: Secondary | ICD-10-CM

## 2021-08-05 DIAGNOSIS — F4310 Post-traumatic stress disorder, unspecified: Secondary | ICD-10-CM

## 2021-08-09 ENCOUNTER — Ambulatory Visit: Payer: Self-pay

## 2021-08-09 NOTE — Lactation Note (Signed)
This note was copied from a baby's chart.  NICU Lactation Consultation Note  Patient Name: Girl Charletha Boak Today's Date: 08/09/2021 Age:31  Subjective Reason for consult: Follow-up assessment; NICU baby; Preterm <34wks; Infant < 6lbs  Visited with mom of 33 1/89 weeks old (adjusted) NICU female, she's a P3 and reports her supply has slightly increased but it's still BNL. Revised additional strategies to increase supply, including power pumping, STS care and galactagogues; she voiced that sometimes she'll sleep through her alarm clock; and that when she started putting baby STS lately, her supply would double on the pumping session following STS. She hasn't been able to do much STS before due to baby's status. Reviewed other alternatives on how to replicate that STS experience since that seems to work well for her.  Objective Infant data: Mother's Current Feeding Choice: Breast Milk  Infant feeding assessment On gavage feedings  Maternal data: [id number]  C-Section, Low Transverse Pumping frequency: 6-7 times/24 hours Pumped volume: 30 mL (30-40 ml) WIC Program: Yes WIC Referral Sent?: Yes Pump: Personal (WIC pump)  Assessment Infant: In NICU Feeding Status: -- (Scheduled feedings)  Maternal: Milk volume: Low  Intervention/Plan Interventions: Breast feeding basics reviewed; DEBP; Education  Plan of care: Encouraged mom to continue pumping consistently every 3 hours, at least 8 pumping sessions/24 hours She'll start doing more STS care as long as baby is medically stable for it She'll start power pumping in the AM for at least a week  No other support person at this time. All questions and concerns answered, family to contact [facility] services PRN.  Consult Status: NICU follow-up NICU Follow-up type: Weekly NICU follow up   [place] 08/09/2021, 1:39 PM

## 2021-08-10 ENCOUNTER — Encounter (HOSPITAL_COMMUNITY): Payer: Self-pay | Admitting: Obstetrics & Gynecology

## 2021-08-10 ENCOUNTER — Inpatient Hospital Stay (HOSPITAL_COMMUNITY)
Admission: AD | Admit: 2021-08-10 | Discharge: 2021-08-10 | Disposition: A | Discharge status: Home / Self Care | Payer: Commercial Managed Care - HMO | Attending: Obstetrics & Gynecology | Admitting: Obstetrics & Gynecology

## 2021-08-10 DIAGNOSIS — O901 Disruption of perineal obstetric wound: Secondary | ICD-10-CM | POA: Insufficient documentation

## 2021-08-10 DIAGNOSIS — O10919 Unspecified pre-existing hypertension complicating pregnancy, unspecified trimester: Secondary | ICD-10-CM

## 2021-08-10 DIAGNOSIS — L7682 Other postprocedural complications of skin and subcutaneous tissue: Secondary | ICD-10-CM

## 2021-08-10 DIAGNOSIS — O165 Unspecified maternal hypertension, complicating the puerperium: Secondary | ICD-10-CM | POA: Insufficient documentation

## 2021-08-10 DIAGNOSIS — R519 Headache, unspecified: Secondary | ICD-10-CM | POA: Diagnosis not present

## 2021-08-10 DIAGNOSIS — Z79899 Other long term (current) drug therapy: Secondary | ICD-10-CM | POA: Diagnosis not present

## 2021-08-10 DIAGNOSIS — O9089 Other complications of the puerperium, not elsewhere classified: Secondary | ICD-10-CM | POA: Insufficient documentation

## 2021-08-10 DIAGNOSIS — R239 Unspecified skin changes: Secondary | ICD-10-CM

## 2021-08-10 LAB — COMPREHENSIVE METABOLIC PANEL
ALT: 23 U/L (ref 0–44)
AST: 23 U/L (ref 15–41)
Albumin: 3.8 g/dL (ref 3.5–5.0)
Alkaline Phosphatase: 64 U/L (ref 38–126)
Anion gap: 9 (ref 5–15)
BUN: 16 mg/dL (ref 6–20)
CO2: 29 mmol/L (ref 22–32)
Calcium: 9.4 mg/dL (ref 8.9–10.3)
Chloride: 104 mmol/L (ref 98–111)
Creatinine, Ser: 0.89 mg/dL (ref 0.44–1.00)
GFR, Estimated: >60 mL/min (ref >60)
Glucose, Bld: 91 mg/dL (ref 70–99)
Potassium: 3.7 mmol/L (ref 3.5–5.1)
Sodium: 142 mmol/L (ref 135–145)
Total Bilirubin: 0.2 mg/dL — ABNORMAL LOW (ref 0.3–1.2)
Total Protein: 6.4 g/dL — ABNORMAL LOW (ref 6.5–8.1)

## 2021-08-10 LAB — CBC WITH DIFFERENTIAL/PLATELET
Abs Immature Granulocytes: 0.04 10*3/uL (ref 0.00–0.07)
Basophils Absolute: 0.1 10*3/uL (ref 0.0–0.1)
Basophils Relative: 1 %
Eosinophils Absolute: 0 10*3/uL (ref 0.0–0.5)
Eosinophils Relative: 0 %
HCT: 35.7 % — ABNORMAL LOW (ref 36.0–46.0)
Hemoglobin: 11.8 g/dL — ABNORMAL LOW (ref 12.0–15.0)
Immature Granulocytes: 1 %
Lymphocytes Relative: 28 %
Lymphs Abs: 2.4 10*3/uL (ref 0.7–4.0)
MCH: 33.2 pg (ref 26.0–34.0)
MCHC: 33.1 g/dL (ref 30.0–36.0)
MCV: 100.6 fL — ABNORMAL HIGH (ref 80.0–100.0)
Monocytes Absolute: 0.8 10*3/uL (ref 0.1–1.0)
Monocytes Relative: 9 %
Neutro Abs: 5.4 10*3/uL (ref 1.7–7.7)
Neutrophils Relative %: 61 %
Platelets: 378 10*3/uL (ref 150–400)
RBC: 3.55 MIL/uL — ABNORMAL LOW (ref 3.87–5.11)
RDW: 13.9 % (ref 11.5–15.5)
WBC: 8.6 10*3/uL (ref 4.0–10.5)
nRBC: 0 % (ref 0.0–0.2)

## 2021-08-10 MED ORDER — HYDROMORPHONE HCL 1 MG/ML IJ SOLN
INTRAMUSCULAR | Status: AC
  Filled 2021-08-10: qty 1

## 2021-08-10 MED ORDER — HYDRALAZINE HCL 20 MG/ML IJ SOLN
5.0000 mg | INTRAMUSCULAR | Status: DC | PRN
  Administered 2021-08-10: 5 mg via INTRAVENOUS
  Filled 2021-08-10: qty 1

## 2021-08-10 MED ORDER — LABETALOL HCL 5 MG/ML IV SOLN
40.0000 mg | INTRAVENOUS | Status: DC | PRN
  Administered 2021-08-10: 40 mg via INTRAVENOUS
  Filled 2021-08-10: qty 8

## 2021-08-10 MED ORDER — LABETALOL HCL 100 MG PO TABS
200.0000 mg | ORAL_TABLET | Freq: Once | ORAL | Status: AC
  Administered 2021-08-10: 200 mg via ORAL
  Filled 2021-08-10 (×2): qty 2

## 2021-08-10 MED ORDER — LABETALOL HCL 200 MG PO TABS: 200.0000 mg | ORAL_TABLET | Freq: Two times a day (BID) | ORAL | 1 refills | Status: DC

## 2021-08-10 MED ORDER — HYDRALAZINE HCL 20 MG/ML IJ SOLN
10.0000 mg | INTRAMUSCULAR | Status: DC | PRN
  Administered 2021-08-10: 10 mg via INTRAVENOUS
  Filled 2021-08-10: qty 1

## 2021-08-10 MED ORDER — LABETALOL HCL 5 MG/ML IV SOLN
20.0000 mg | INTRAVENOUS | Status: DC | PRN
  Administered 2021-08-10: 20 mg via INTRAVENOUS
  Filled 2021-08-10: qty 4

## 2021-08-10 MED ORDER — HYDRALAZINE HCL 20 MG/ML IJ SOLN: 10.0000 mg | INTRAMUSCULAR | Status: DC | PRN

## 2021-08-10 MED ORDER — HYDROMORPHONE HCL 1 MG/ML IJ SOLN
1.0000 mg | Freq: Once | INTRAMUSCULAR | Status: AC
  Administered 2021-08-10: 1 mg via INTRAVENOUS
  Filled 2021-08-10: qty 1

## 2021-08-10 MED ORDER — LABETALOL HCL 5 MG/ML IV SOLN: 80.0000 mg | INTRAVENOUS | Status: DC | PRN

## 2021-08-10 MED ORDER — LABETALOL HCL 5 MG/ML IV SOLN: 20.0000 mg | INTRAVENOUS | Status: DC | PRN

## 2021-08-10 MED ORDER — LABETALOL HCL 5 MG/ML IV SOLN: 40.0000 mg | INTRAVENOUS | Status: DC | PRN

## 2021-08-10 NOTE — MAU Note (Signed)
.  Krystal [name] is a 31 y.o. s/p repeat c/s on 07/21/21 at 19w1dgestation here in MAU reporting: a headache that started last night, incisional pain and discharge, and vaginal bleeding with clots. Onset of complaint: last night Pain score: 6 Vitals:   08/10/21 1425 08/10/21 1427  BP:  (!) 174/118  Pulse:  87  Resp:    Temp:    SpO2: 98%      Nicole Nugent NP notified of severe range blood pressures. NVernice JeffersonNP came to evaluate patient in person at 122

## 2021-08-10 NOTE — MAU Provider Note (Signed)
History     CSN: [id number]  Arrival date and time: 08/10/21 1328   Event Date/Time   First Provider Initiated Contact with Patient 08/10/21 1427      Chief Complaint  Patient presents with   Incisional Pain   Drainage from Incision   Vaginal Bleeding    Passing clots larger than quarter size   Headache   Krystal [name] is a 31 y.o. [id number] at postpartum who presents to MAU for incisional pain and passing of large blood clots, that were about the size of a quarter. Incidentally she was found to have severe range blood pressures on arrival to MAU necessitating immediate medical intervention.  Patient reports incisional pain went away "for a bit" but then she reports she noticed that her incision was starting to reopen, so she was seen by her office on 08/02/2021 and sent home with a note that she was not experiencing any s/sx of infection at that time. After she was sent home, patient states she told them at the time she was having pain and was told to continue taking ibuprofen around the clock. Patient states she is taking ibuprofen 3 times daily. Patient states that sometimes it helps the pain and sometimes it does not. Patient last took ibuprofen around 9AM this morning and states that it did not help. Patient currently rates pain as 8/10. Patient had serosanguinous drainage according to office note, but patient states drainage got significantly worse after leaving the office and it was looking like pus mixed with blood. Patient states she was covering it with gauze and did not notice much drainage, but was changing the gauze every time she went to the bathroom. Patient reports she was recently prescribed antibiotics for a sinus infection (Augmentin), and she spoke with her over the phone, and reports that the antibiotics helped reduce the amount of drainage, but not the pain. Patient states her sinus symptoms are almost gone at this time.  Patient also reports passing blood clots.  When she left the hospital, pt reports spotting only, which has continued since that time, but patient states about 4 days ago she passed a blood clot about the size of a golf ball. Patient states she also passed a golf ball size clots this morning along with several quarter sized clots. Patient states she is wearing a pad right now and shows the provider a single small spot of brown blood on the pad. Patient states that is what it typically looks like on her pad, but when she goes to pee she has been passing these clots.  Patient also endorses HA that started last night. Patient rates HA as 7/10. Patient states she took ibuprofen and Tylenol at home and they did not change her HA. Patient states she used to have a lot of migraines, but since she had her first child she has not suffered from headaches. Patient states she had really bad headaches when she got admitted to the hospital for delivery, but after delivery they went away. Patient states this headache feels like the headaches she had when she was here in the hospital.  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  Pt denies vaginal discharge/odor/itching. Pt denies N/V, constipation, diarrhea, or urinary problems.  Pt denies fever, chills, fatigue, sweating or changes in appetite. Pt denies SOB or chest pain. Pt denies dizziness, light-headedness, weakness.  Allergies? See list in chart Current medications/supplements? See list in chart Pregnant/postpartum/breastfeeding? breastfeeding Prenatal care provider? [facility], next appt 08/29/2021    OB History     Gravida  5   Para  3   Term  0   Preterm  3   AB   2   Living  2      SAB  2   IAB  0   Ectopic  0   Multiple  0   Live Births  2           Past Medical History:  Diagnosis Date   Acute acalculous cholecystitis s/p lap cholecystectomy 05/11/2020 05/11/2020   Angio-edema 01/15/2021   Anxiety    Asthma    HELLP (hemolytic anemia/elev liver enzymes/low platelets in pregnancy) 12/08/2017   IC (interstitial cystitis)    Kidney stones    Migraines    Necrotizing fasciitis ([place])    left hand amputation   Severe uncontrolled hypertension 10/08/2017   UTI (urinary tract infection)     Past Surgical History:  Procedure Laterality Date   APPENDECTOMY     BIOPSY  05/10/2020   Procedure: BIOPSY;  Surgeon: Arta Silence, MD;  Location: [facility];  Service: Endoscopy;;   CESAREAN SECTION N/A 12/06/2017   Procedure: CESAREAN SECTION;  Surgeon: Sloan Leiter, MD;  Location: [facility];  Service: Obstetrics;  Laterality: N/A;   CESAREAN SECTION  07/21/2021   Procedure: CESAREAN SECTION;  Surgeon: Donnamae Jude, MD;  Location: [facility];  Service: Obstetrics;;   CHOLECYSTECTOMY N/A 05/11/2020   Procedure: SINGLE SITE LAPAROSCOPIC CHOLECYSTECTOMY AND LIVER BIOSY;  Surgeon: Michael Boston, MD;  Location: [facility];  Service: General;  Laterality: N/A;   ESOPHAGOGASTRODUODENOSCOPY (EGD) WITH PROPOFOL N/A 05/10/2020   Procedure: ESOPHAGOGASTRODUODENOSCOPY (EGD) WITH PROPOFOL;  Surgeon: Arta Silence, MD;  Location: [facility];  Service: Endoscopy;  Laterality: N/A;   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   HAND RECONSTRUCTION Right    INCISION AND DRAINAGE     NASAL SEPTUM SURGERY      Family History  Problem Relation Age of Onset   Hypertension Mother    Hypertension Father     Social History   Tobacco Use   Smoking status: Never   Smokeless tobacco: Never   Tobacco comments:    She tried a cigarette and never smoked again  Vaping Use   Vaping Use: Never used  Substance Use Topics   Alcohol  use: Not Currently    Comment: bottle of wine a week-occ   Drug use: No    Allergies:  Allergies  Allergen Reactions   Red Dye Other (See Comments)    ALL CAUSE PROBLEMS #40 IS WORST-MIGRAINES    Compazine [Prochlorperazine Maleate] Other (See Comments)    TWITCHING, CANT STAY STILL. Pt states she can tolerate promethazine   Other Other (See Comments)    FANSIDAR FOR MALARIA-ASTHMA   Vicodin [Hydrocodone-Acetaminophen] Itching, Nausea And Vomiting and Other (See Comments)    Pt states she can tolerate acetaminophen   Amitriptyline Hcl Other (See Comments)    Angry moods   Duloxetine Hcl Other (See Comments)    Passing out, trimble, irritable   Reglan [Metoclopramide] Other (See Comments)    restless   Nifedipine Nausea And Vomiting and Other (See Comments)    Discussed with  patient and maternal fetal medicine, patient has intolerance but not frank allergy.    Medications Prior to Admission  Medication Sig Dispense Refill Last Dose   acetaminophen (TYLENOL) 500 MG tablet Take 2 tablets (1,000 mg total) by mouth every 6 (six) hours as needed. 30 tablet 0 08/09/2021   albuterol (PROVENTIL HFA;VENTOLIN HFA) 108 (90 Base) MCG/ACT inhaler Inhale 2 puffs into the lungs every 6 (six) hours as needed for wheezing or shortness of breath.   Past Week   amLODipine (NORVASC) 10 MG tablet Take 1 tablet (10 mg total) by mouth daily. 90 tablet 2 08/09/2021   amoxicillin-clavulanate (AUGMENTIN) 875-125 MG tablet Take 1 tablet by mouth 2 (two) times daily. 10 tablet 0 08/09/2021   cetirizine (ZYRTEC ALLERGY) 10 MG tablet Take 1 tablet (10 mg total) by mouth 2 (two) times daily. 60 tablet 5 08/10/2021   hydrALAZINE (APRESOLINE) 100 MG tablet Take 1 tablet (100 mg total) by mouth 3 (three) times daily. 90 tablet 1 08/10/2021 at 0800   ibuprofen (ADVIL) 600 MG tablet Take 1 tablet (600 mg total) by mouth every 6 (six) hours as needed. 30 tablet 0 08/10/2021   levothyroxine (SYNTHROID) 125 MCG tablet Take 1  tablet (125 mcg total) by mouth daily before breakfast. 45 tablet 3 08/10/2021   lisinopril (ZESTRIL) 5 MG tablet Take 1 tablet (5 mg total) by mouth 2 (two) times daily. 60 tablet 3 08/10/2021   LORazepam (ATIVAN) 1 MG tablet Take 2 tablets (2 mg total) by mouth every 6 (six) hours as needed for anxiety. 24 tablet 0 08/09/2021   pantoprazole (PROTONIX) 40 MG tablet Take 1 tablet (40 mg total) by mouth daily. 30 tablet 4 08/09/2021   Prenatal Multivit-Min-Fe-FA (PRE-NATAL PO) Take 1 tablet by mouth daily.   08/10/2021   venlafaxine XR (EFFEXOR-XR) 75 MG 24 hr capsule Take 3 capsules (225 mg total) by mouth daily with breakfast. 90 capsule 1 08/10/2021   budesonide-formoterol (SYMBICORT) 80-4.5 MCG/ACT inhaler Inhale 2 puffs into the lungs in the morning and at bedtime. Take for 1-2 weeks at a time during asthma flare. Rinse mouth after each use. (Patient not taking: Reported on 08/02/2021) 1 each 3    cholecalciferol (VITAMIN D3) 25 MCG (1000 UNIT) tablet Take 1,000 Units by mouth daily. (Patient not taking: Reported on 07/29/2021)      EPINEPHrine 0.3 mg/0.3 mL IJ SOAJ injection Inject 0.3 mg into the muscle as needed for anaphylaxis. (Patient not taking: Reported on 07/29/2021) 1 each 2    furosemide (LASIX) 20 MG tablet Take 1 tablet (20 mg total) by mouth daily for 3 days. 3 tablet 0    predniSONE (DELTASONE) 20 MG tablet Take 1 tablet (20 mg total) by mouth daily with breakfast. (Patient not taking: Reported on 08/10/2021) 5 tablet 0 Not Taking   senna (SENOKOT) 8.6 MG tablet Take 1 tablet (8.6 mg total) by mouth daily as needed for constipation. (Patient not taking: Reported on 07/29/2021) 30 tablet 0     Review of Systems  Constitutional:  Negative for chills, diaphoresis, fatigue and fever.  Eyes:  Negative for visual disturbance.  Respiratory:  Negative for shortness of breath.   Cardiovascular:  Negative for chest pain.  Gastrointestinal:  Positive for abdominal pain ((incisional pain, wound  drainage)). Negative for constipation, diarrhea, nausea and vomiting.  Genitourinary:  Positive for vaginal bleeding. Negative for dysuria, flank pain, frequency, pelvic pain, urgency and vaginal discharge.  Neurological:  Positive for headaches. Negative for dizziness, weakness and light-headedness.  Physical Exam   Blood pressure (!) 146/95, pulse 70, temperature 98.3 F (36.8 C), temperature source Oral, resp. rate 18, height '5\' 11"'$  (1.803 m), weight 100 kg, SpO2 97 %, unknown if currently breastfeeding.  Patient Vitals for the past 24 hrs:  BP Temp Temp src Pulse Resp SpO2 Height Weight  08/10/21 1630 (!) 146/95 -- -- 70 -- -- -- --  08/10/21 1615 (!) 150/97 -- -- 75 -- 97 % -- --  08/10/21 1609 -- -- -- -- 18 98 % -- --  08/10/21 1600 (!) 144/95 -- -- 70 -- 98 % -- --  08/10/21 1551 (!) 143/84 -- -- 73 -- 98 % -- --  08/10/21 1540 (!) 155/91 -- -- 74 -- 99 % -- --  08/10/21 1532 (!) 163/104 -- -- 84 -- 99 % -- --  08/10/21 1521 (!) 154/91 -- -- 78 -- -- -- --  08/10/21 1510 (!) 141/94 -- -- 81 -- 98 % -- --  08/10/21 1500 (!) 147/87 -- -- 78 -- 97 % -- --  08/10/21 1455 -- -- -- -- -- 99 % -- --  08/10/21 1450 (!) 174/107 -- -- 88 -- 99 % -- --  08/10/21 1439 (!) 183/111 -- -- 89 -- -- -- --  08/10/21 1430 (!) 176/110 -- -- 82 -- 99 % -- --  08/10/21 1427 (!) 174/118 -- -- 87 -- -- -- --  08/10/21 1425 -- -- -- -- -- 98 % -- --  08/10/21 1415 (!) 172/117 -- -- 88 -- 99 % -- --  08/10/21 1401 (!) 164/112 -- -- 91 -- -- -- --  08/10/21 1351 (!) 166/113 -- -- 78 -- -- -- --  08/10/21 1343 (!) 174/117 98.3 F (36.8 C) Oral 92 18 98 % '5\' 11"'$  (1.803 m) 100 kg   Physical Exam Vitals and nursing note reviewed.  Constitutional:      General: She is not in acute distress.    Appearance: Normal appearance. She is not ill-appearing, toxic-appearing or diaphoretic.  HENT:     Head: Normocephalic and atraumatic.  Pulmonary:     Effort: Pulmonary effort is normal.  Abdominal:      Palpations: Abdomen is soft.     Tenderness: There is abdominal tenderness (Mild tenderness along incision with some mild redness along entire incision).     Comments: Small openings on either end of incision less then 0.5cm in length with small amount of purulent drainage from either side.  Skin:    General: Skin is warm and dry.  Neurological:     Mental Status: She is alert and oriented to person, place, and time.  Psychiatric:        Mood and Affect: Mood normal.        Behavior: Behavior normal.        Thought Content: Thought content normal.        Judgment: Judgment normal.    Results for orders placed or performed during the hospital encounter of 08/10/21 (from the past 24 hour(s))  Comprehensive metabolic panel     Status: Abnormal   Collection Time: 08/10/21  1:54 PM  Result Value Ref Range   Sodium 142 135 - 145 mmol/L   Potassium 3.7 3.5 - 5.1 mmol/L   Chloride 104 98 - 111 mmol/L   CO2 29 22 - 32 mmol/L   Glucose, Bld 91 70 - 99 mg/dL   BUN 16 6 - 20 mg/dL   Creatinine, Ser 0.89 0.44 -  1.00 mg/dL   Calcium 9.4 8.9 - 10.3 mg/dL   Total Protein 6.4 (L) 6.5 - 8.1 g/dL   Albumin 3.8 3.5 - 5.0 g/dL   AST 23 15 - 41 U/L   ALT 23 0 - 44 U/L   Alkaline Phosphatase 64 38 - 126 U/L   Total Bilirubin 0.2 (L) 0.3 - 1.2 mg/dL   GFR, Estimated >60 >60 mL/min   Anion gap 9 5 - 15  CBC with Differential/Platelet     Status: Abnormal   Collection Time: 08/10/21  1:54 PM  Result Value Ref Range   WBC 8.6 4.0 - 10.5 K/uL   RBC 3.55 (L) 3.87 - 5.11 MIL/uL   Hemoglobin 11.8 (L) 12.0 - 15.0 g/dL   HCT 35.7 (L) 36.0 - 46.0 %   MCV 100.6 (H) 80.0 - 100.0 fL   MCH 33.2 26.0 - 34.0 pg   MCHC 33.1 30.0 - 36.0 g/dL   RDW 13.9 11.5 - 15.5 %   Platelets 378 150 - 400 K/uL   nRBC 0.0 0.0 - 0.2 %   Neutrophils Relative % 61 %   Neutro Abs 5.4 1.7 - 7.7 K/uL   Lymphocytes Relative 28 %   Lymphs Abs 2.4 0.7 - 4.0 K/uL   Monocytes Relative 9 %   Monocytes Absolute 0.8 0.1 - 1.0 K/uL    Eosinophils Relative 0 %   Eosinophils Absolute 0.0 0.0 - 0.5 K/uL   Basophils Relative 1 %   Basophils Absolute 0.1 0.0 - 0.1 K/uL   Immature Granulocytes 1 %   Abs Immature Granulocytes 0.04 0.00 - 0.07 K/uL   MAU Course  Procedures  MDM -preeclampsia protocol initiated upon arrival to MAU after BP x2 in severe range -consulted with Dr. Roselie Awkward who comes to bedside to evaluate patient and incision -per Dr. Roselie Awkward, no need for admission for elevated BP or repeat magnesium at this time. Will adjust BP medication regimen, especially in light of new information that lisinopril is causing a dry cough. Per Dr. Roselie Awkward, also no need for CT/US at this time, healing process and bleeding appear normal. -CBC: H/H 11.8/35.7, platelets 378 -CMP: serum creatinine 0.89, AST/ALT 23/23, labs much improved compared to time of delivery -labetalol '200mg'$  given for BP and Dilaudid '1mg'$  given for HA per consultation with Dr .Roselie Awkward -after administration, pt reports pain is significantly better and reports HA now 5/10 and abdominal pain 4/10 -pt discharged to home in stable condition  Orders Placed This Encounter  Procedures   Comprehensive metabolic panel    Standing Status:   Standing    Number of Occurrences:   1   CBC with Differential/Platelet    Standing Status:   Standing    Number of Occurrences:   1   Notify physician (specify) Confirmatory reading of BP> 160/110 15 minutes later    Confirmatory reading of BP> 160/110 15 minutes later    Standing Status:   Standing    Number of Occurrences:   1    Order Specific Question:   Notify Physician    Answer:   Temp greater than or equal to 100.4    Order Specific Question:   Notify Physician    Answer:   RR greater than 24 or less than 10    Order Specific Question:   Notify Physician    Answer:   HR greater than 120 or less than 50    Order Specific Question:   Notify Physician    Answer:  SBP greater than 160 mmHG or less than 80 mmHG    Order  Specific Question:   Notify Physician    Answer:   DBP greater than 110 mmHG or less than 45 mmHG    Order Specific Question:   Notify Physician    Answer:   Urinary output is less than 164m for any 4 hour period   Measure blood pressure    20 minutes after giving hydralazine 10 MG IV dose.  Call MD if SBP >/= 160 or DBP >/= 110.    Standing Status:   Standing    Number of Occurrences:   1   Measure blood pressure    10 minutes after giving labetalol 40 MG IV dose.  Call MD if SBP >/= 160 or DBP >/= 110.    Standing Status:   Standing    Number of Occurrences:   1   Meds ordered this encounter  Medications   DISCONTD: labetalol (NORMODYNE) injection 20 mg   DISCONTD: labetalol (NORMODYNE) injection 40 mg   DISCONTD: labetalol (NORMODYNE) injection 80 mg   DISCONTD: hydrALAZINE (APRESOLINE) injection 10 mg   AND Linked Order Group    hydrALAZINE (APRESOLINE) injection 5 mg    hydrALAZINE (APRESOLINE) injection 10 mg    labetalol (NORMODYNE) injection 20 mg    labetalol (NORMODYNE) injection 40 mg   labetalol (NORMODYNE) tablet 200 mg   HYDROmorphone (DILAUDID) injection 1 mg   HYDROmorphone (DILAUDID) 1 MG/ML injection    FJerelyn ScottE: cabinet override   Assessment and Plan   1. Postpartum state   2. Acute nonintractable headache, unspecified headache type   3. Incisional pain   4. Impaired skin integrity associated with surgical incision   5. Chronic hypertension in pregnancy     Allergies as of 08/10/2021       Reactions   Red Dye Other (See Comments)   ALL CAUSE PROBLEMS #40 IS WORST-MIGRAINES    Compazine [prochlorperazine Maleate] Other (See Comments)   TWITCHING, CANT STAY STILL. Pt states she can tolerate promethazine   Other Other (See Comments)   FANSIDAR FOR MALARIA-ASTHMA   Vicodin [hydrocodone-acetaminophen] Itching, Nausea And Vomiting, Other (See Comments)   Pt states she can tolerate acetaminophen   Amitriptyline Hcl Other (See Comments)   Angry  moods   Duloxetine Hcl Other (See Comments)   Passing out, trimble, irritable   Lisinopril Cough   Reglan [metoclopramide] Other (See Comments)   restless   Nifedipine Nausea And Vomiting, Other (See Comments)   Discussed with patient and maternal fetal medicine, patient has intolerance but not frank allergy.        Medication List     TAKE these medications    acetaminophen 500 MG tablet Commonly known as: TYLENOL Take 2 tablets (1,000 mg total) by mouth every 6 (six) hours as needed.   albuterol 108 (90 Base) MCG/ACT inhaler Commonly known as: VENTOLIN HFA Inhale 2 puffs into the lungs every 6 (six) hours as needed for wheezing or shortness of breath.   amLODipine 10 MG tablet Commonly known as: NORVASC Take 1 tablet (10 mg total) by mouth daily.   amoxicillin-clavulanate 875-125 MG tablet Commonly known as: AUGMENTIN Take 1 tablet by mouth 2 (two) times daily.   budesonide-formoterol 80-4.5 MCG/ACT inhaler Commonly known as: Symbicort Inhale 2 puffs into the lungs in the morning and at bedtime. Take for 1-2 weeks at a time during asthma flare. Rinse mouth after each use.   cetirizine 10 MG tablet  Commonly known as: ZyrTEC Allergy Take 1 tablet (10 mg total) by mouth 2 (two) times daily.   cholecalciferol 25 MCG (1000 UNIT) tablet Commonly known as: VITAMIN D3 Take 1,000 Units by mouth daily.   EPINEPHrine 0.3 mg/0.3 mL Soaj injection Commonly known as: EPI-PEN Inject 0.3 mg into the muscle as needed for anaphylaxis.   furosemide 20 MG tablet Commonly known as: LASIX Take 1 tablet (20 mg total) by mouth daily for 3 days.   hydrALAZINE 100 MG tablet Commonly known as: APRESOLINE Take 1 tablet (100 mg total) by mouth 3 (three) times daily.   ibuprofen 600 MG tablet Commonly known as: ADVIL Take 1 tablet (600 mg total) by mouth every 6 (six) hours as needed.   labetalol 200 MG tablet Commonly known as: NORMODYNE Take 1 tablet (200 mg total) by mouth 2  (two) times daily.   levothyroxine 125 MCG tablet Commonly known as: Synthroid Take 1 tablet (125 mcg total) by mouth daily before breakfast.   LORazepam 1 MG tablet Commonly known as: ATIVAN Take 2 tablets (2 mg total) by mouth every 6 (six) hours as needed for anxiety.   pantoprazole 40 MG tablet Commonly known as: Protonix Take 1 tablet (40 mg total) by mouth daily.   PRE-NATAL PO Take 1 tablet by mouth daily.   predniSONE 20 MG tablet Commonly known as: DELTASONE Take 1 tablet (20 mg total) by mouth daily with breakfast.   senna 8.6 MG Tabs tablet Commonly known as: Senokot Take 1 tablet (8.6 mg total) by mouth daily as needed for constipation.   venlafaxine XR 75 MG 24 hr capsule Commonly known as: EFFEXOR-XR Take 3 capsules (225 mg total) by mouth daily with breakfast.        -BP medication regimen changed per Dr. Roselie Awkward - lisinopril d/c, amlodipine remains the same at '10mg'$  daily, hydralazine remains the same at '100mg'$  TID, added labetalol '200mg'$  BID -message sent to [facility] with high importance to schedule patient on Tuesday 6/20 for BP check -return MAU precautions given -pt discharged to home in stable condition  Elmyra Ricks E Jayce Kainz 08/10/2021, 4:46 PM

## 2021-08-11 NOTE — Progress Notes (Unsigned)
Follow Up Note  RE: Krystal [name] MRN: [id number] DOB: October 31, 1990 Date of Office Visit: 08/12/2021  Referring provider: Audley Hose, MD Primary care provider: Audley Hose, MD  Chief Complaint: Urticaria  History of Present Illness: I had the pleasure of seeing Krystal [name] for a follow up visit at the [facility] [place] on 08/12/2021. She is a 31 y.o. female, who is being followed for chronic urticaria, angioedema, allergic rhinitis, asthma. Her previous allergy office visit was on 03/26/2021 with Dr. Maudie Mercury via telemedicine. Today is a regular follow up visit.  Chronic urticaria Last week she had a few breakouts around the groin area and the back of her right hand. No swelling. Currently taking zyrtec 10mg  twice a day. She was switched from famotidine to pantoprazole during pregnancy.  Patient had to deliver her baby at 26 weeks via c-section due to HELLP syndrome. She is currently in the NICU intubated. The baby will also need cardiac surgery in the future.    Perennial allergic rhinitis Noted some pressure in the sinuses and itchy eyes since she got discharged from the hospital. Denies any fevers/chills.   Finished Augmentin and prednisone and now symptoms are slowly returning with the sinus pressure only.  Currently not on any nasal sprays.   Asthma Using albuterol a few times per week when outdoors.   Denies any ER/urgent care visits or prednisone (for asthma) use since the last visit.  Assessment and Plan: Krystal [name] is a 31 y.o. female with: Chronic urticaria Past history - Breaking out in whole body hives a few times per week with associated periorbital/lip swelling for the past 2-3 months - sometimes has trouble breathing. No triggers noted. Spouse concerned about cat allergies - 1 cat at home x 6+ years. Tried antihistamines with some benefit. 1 course of prednisone. Bloodwork (CBC diff, CMP, TSH, ESR, CRP, tryptase, Alpha gal) all  normal. CU elevated, positive ANA with elevated RNP antibody - referral for rheumatology placed in December 2022.  Interim history - doing better with no angioedema but had a few flares after she had a stressful even with emergency c-section delivery and baby is in NICU intubated.  Continue zyrtec (cetirizine) 10mg  twice a day. If symptoms are not controlled or causes drowsiness let us know. Restart Pepcid (famotidine) 20mg  twice a day.  May take benadryl 25mg  to 50mg  every 4-6 hours additionally as needed for flares. Avoid the following potential triggers: alcohol, tight clothing, NSAIDs, hot showers and getting overheated.  Perennial allergic rhinitis Past history - Rhino conjunctivitis symptoms mainly in the spring and fall and around close cat contact. Deviated septum and nasal polypectomy surgery in the past.1 cat at home. 2022 skin prick testing was only positive to cats. 2022 bloodwork positive to cat and dog.  Interim history - finished Augmentin and prednisone but having some sinus pressures, mucous is no longer discolored. Start prednisone taper. Start Ryaltris (olopatadine + mometasone nasal spray combination) 1-2 sprays per nostril twice a day. Sample given. Nasal saline spray (i.e., Simply Saline) or nasal saline lavage (i.e., NeilMed) is recommended as needed and prior to medicated nasal sprays. Continue environmental control measures. Use over the counter antihistamines such as Zyrtec (cetirizine), Claritin (loratadine), Allegra (fexofenadine), or Xyzal (levocetirizine) daily as needed. May take twice a day during allergy flares. May switch antihistamines every few months.  Mild persistent asthma without complication Past history - Usually flares in the spring and uses albuterol daily then. No prior maintenance inhalers. 2022  spirometry was normal.  Interim history - using albuterol when outdoors. Today's spirometry showed some restriction. Daily controller medication(s):  none During upper respiratory infections/asthma flares:  Start Symbicort 56mg 2 puffs twice a day and rinse mouth afterwards for 1-2 weeks until your breathing symptoms return to baseline.  May use albuterol rescue inhaler 2 puffs every 4 to 6 hours as needed for shortness of breath, chest tightness, coughing, and wheezing. May use albuterol rescue inhaler 2 puffs 5 to 15 minutes prior to strenuous physical activities. Monitor frequency of use.   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 Negative hymenoptera panel Continue to avoid.  Return in about 4 months (around 12/12/2021).  No orders of the defined types were placed in this encounter.  Lab Orders  No laboratory test(s) ordered today    Diagnostics: Spirometry:  Tracings reviewed. Her effort: Good reproducible efforts. FVC: 3.45L FEV1: 2.69L, 66% predicted FEV1/FVC ratio: 78% Interpretation: Spirometry consistent with possible restrictive disease.  Please see scanned spirometry results for details.  Medication List:  Current Outpatient Medications  Medication Sig Dispense Refill   acetaminophen (TYLENOL) 500 MG tablet Take 2 tablets (1,000 mg total) by mouth every 6 (six) hours as needed. 30 tablet 0   albuterol (PROVENTIL HFA;VENTOLIN HFA) 108 (90 Base) MCG/ACT inhaler Inhale 2 puffs into the lungs every 6 (six) hours as needed for wheezing or shortness of breath.     amLODipine (NORVASC) 10 MG tablet Take 1 tablet (10 mg total) by mouth daily. 90 tablet 2   budesonide-formoterol (SYMBICORT) 80-4.5 MCG/ACT inhaler Inhale 2 puffs into the lungs in the morning and at bedtime. Take for 1-2 weeks at a time during asthma flare. Rinse mouth after each use. 1 each 3   cetirizine (ZYRTEC ALLERGY) 10 MG tablet Take 1 tablet (10 mg total) by mouth 2 (two) times daily. 60 tablet 5   EPINEPHrine 0.3 mg/0.3 mL IJ SOAJ injection Inject 0.3 mg into the muscle as  needed for anaphylaxis. 1 each 2   famotidine (PEPCID) 20 MG tablet Take 20 mg by mouth 2 (two) times daily.     hydrALAZINE (APRESOLINE) 100 MG tablet Take 1 tablet (100 mg total) by mouth 3 (three) times daily. 90 tablet 1   ibuprofen (ADVIL) 600 MG tablet Take 1 tablet (600 mg total) by mouth every 6 (six) hours as needed. 30 tablet 0   labetalol (NORMODYNE) 200 MG tablet Take 1 tablet (200 mg total) by mouth 2 (two) times daily. 60 tablet 1   levothyroxine (SYNTHROID) 125 MCG tablet Take 1 tablet (125 mcg total) by mouth daily before breakfast. 45 tablet 3   LORazepam (ATIVAN) 1 MG tablet Take 2 tablets (2 mg total) by mouth every 6 (six) hours as needed for anxiety. 24 tablet 0   pantoprazole (PROTONIX) 40 MG tablet Take 1 tablet (40 mg total) by mouth daily. 30 tablet 4   Prenatal Multivit-Min-Fe-FA (PRE-NATAL PO) Take 1 tablet by mouth daily.     venlafaxine XR (EFFEXOR-XR) 75 MG 24 hr capsule Take 3 capsules (225 mg total) by mouth daily with breakfast. 90 capsule 1   furosemide (LASIX) 20 MG tablet Take 1 tablet (20 mg total) by mouth daily for 3 days. 3 tablet 0   No current facility-administered medications for this visit.   Allergies: Allergies  Allergen Reactions   Red Dye Other (See Comments)    ALL CAUSE PROBLEMS #40 IS WORST-MIGRAINES  Compazine [Prochlorperazine Maleate] Other (See Comments)    TWITCHING, CANT STAY STILL. Pt states she can tolerate promethazine   Other Other (See Comments)    FANSIDAR FOR MALARIA-ASTHMA   Vicodin [Hydrocodone-Acetaminophen] Itching, Nausea And Vomiting and Other (See Comments)    Pt states she can tolerate acetaminophen   Amitriptyline Hcl Other (See Comments)    Angry moods   Duloxetine Hcl Other (See Comments)    Passing out, trimble, irritable   Lisinopril Cough   Reglan [Metoclopramide] Other (See Comments)    restless   Nifedipine Nausea And Vomiting and Other (See Comments)    Discussed with patient and maternal fetal  medicine, patient has intolerance but not frank allergy.   I reviewed her past medical history, social history, family history, and environmental history and no significant changes have been reported from her previous visit.  Review of Systems  Constitutional:  Negative for appetite change, chills, fever and unexpected weight change.  HENT:  Positive for sinus pressure and sinus pain. Negative for congestion and rhinorrhea.   Eyes:  Negative for itching.  Respiratory:  Negative for cough, chest tightness, shortness of breath and wheezing.   Cardiovascular:  Negative for chest pain.  Gastrointestinal:  Negative for abdominal pain.  Genitourinary:  Negative for difficulty urinating.  Skin:  Positive for rash.  Allergic/Immunologic: Positive for environmental allergies. Negative for food allergies.  Neurological:  Positive for headaches.    Objective: BP 138/82   Pulse 76   Temp 98.9 F (37.2 C) (Temporal)   Resp 18   SpO2 98%  There is no height or weight on file to calculate BMI. Physical Exam Vitals and nursing note reviewed.  Constitutional:      Appearance: Normal appearance. She is well-developed.  HENT:     Head: Normocephalic and atraumatic.     Right Ear: Tympanic membrane and external ear normal.     Left Ear: Tympanic membrane and external ear normal.     Nose: Nose normal.     Mouth/Throat:     Mouth: Mucous membranes are moist.     Pharynx: Oropharynx is clear.  Eyes:     Conjunctiva/sclera: Conjunctivae normal.  Cardiovascular:     Rate and Rhythm: Normal rate and regular rhythm.     Heart sounds: Normal heart sounds. No murmur heard. Pulmonary:     Effort: Pulmonary effort is normal.     Breath sounds: Normal breath sounds. No wheezing, rhonchi or rales.  Musculoskeletal:     Cervical back: Neck supple.  Skin:    General: Skin is warm.     Findings: No rash.  Neurological:     Mental Status: She is alert and oriented to person, place, and time.   Psychiatric:        Behavior: Behavior normal.    Previous notes and tests were reviewed. The plan was reviewed with the patient/family, and all questions/concerned were addressed.  It was my pleasure to see Krystal [name] today and participate in her care. Please feel free to contact me with any questions or concerns.  Sincerely,  Rexene Alberts, DO Allergy & Immunology  [facility] office: [place] office: [phone]

## 2021-08-12 ENCOUNTER — Ambulatory Visit (INDEPENDENT_AMBULATORY_CARE_PROVIDER_SITE_OTHER): Payer: Commercial Managed Care - HMO | Admitting: Allergy

## 2021-08-12 ENCOUNTER — Other Ambulatory Visit: Payer: Self-pay

## 2021-08-12 ENCOUNTER — Encounter: Payer: Self-pay | Admitting: Allergy

## 2021-08-12 VITALS — BP 138/82 | HR 76 | Temp 98.9°F | Resp 18

## 2021-08-12 DIAGNOSIS — T783XXD Angioneurotic edema, subsequent encounter: Secondary | ICD-10-CM

## 2021-08-12 DIAGNOSIS — J4530 Mild persistent asthma, uncomplicated: Secondary | ICD-10-CM | POA: Diagnosis not present

## 2021-08-12 DIAGNOSIS — T63481A Toxic effect of venom of other arthropod, accidental (unintentional), initial encounter: Secondary | ICD-10-CM

## 2021-08-12 DIAGNOSIS — J3089 Other allergic rhinitis: Secondary | ICD-10-CM | POA: Diagnosis not present

## 2021-08-12 DIAGNOSIS — L508 Other urticaria: Secondary | ICD-10-CM

## 2021-08-12 NOTE — Assessment & Plan Note (Signed)
Past history - Breaking out in whole body hives a few times per week with associated periorbital/lip swelling for the past 2-3 months - sometimes has trouble breathing. No triggers noted. Spouse concerned about cat allergies - 1 cat at home x 6+ years. Tried antihistamines with some benefit. 1 course of prednisone. Bloodwork (CBC diff, CMP, TSH, ESR, CRP, tryptase, Alpha gal) all normal. CU elevated, positive ANA with elevated RNP antibody - referral for rheumatology placed in December 2022.  Interim history - doing better with no angioedema but had a few flares after she had a stressful even with emergency c-section delivery and baby is in NICU intubated.   Continue zyrtec (cetirizine) 14m twice a day.  If symptoms are not controlled or causes drowsiness let [place].  Restart Pepcid (famotidine) 245mtwice a day.   May take benadryl 2571mo 15m72mery 4-6 hours additionally as needed for flares. . Avoid the following potential triggers: alcohol, tight clothing, NSAIDs, hot showers and getting overheated.

## 2021-08-12 NOTE — Assessment & Plan Note (Signed)
Past history - Usually flares in the spring and uses albuterol daily then. No prior maintenance inhalers. 2022 spirometry was normal.  Interim history - using albuterol when outdoors.  Today's spirometry showed some restriction. . Daily controller medication(s): none . During upper respiratory infections/asthma flares:  o Start Symbicort 69mg 2 puffs twice a day and rinse mouth afterwards for 1-2 weeks until your breathing symptoms return to baseline.  . May use albuterol rescue inhaler 2 puffs every 4 to 6 hours as needed for shortness of breath, chest tightness, coughing, and wheezing. May use albuterol rescue inhaler 2 puffs 5 to 15 minutes prior to strenuous physical activities. Monitor frequency of use.

## 2021-08-12 NOTE — Assessment & Plan Note (Signed)
Past history - Localized reaction to bee stings with possible shortness of breath. Negative hymenoptera panel . Continue to avoid. 

## 2021-08-12 NOTE — Patient Instructions (Addendum)
Sinusitis Prednisone '10mg'$  tablet pack: 2 tablets given in office today. Take 2 more tablets before bed today.  Then take 2 tablets twice a day for 2 more days. Then take 2 tablets once a day for 1 day. Then take 1 tablet once a day for 1 day.   Start Ryaltris (olopatadine + mometasone nasal spray combination) 1-2 sprays per nostril twice a day. Sample given. Nasal saline spray (i.e., Simply Saline) or nasal saline lavage (i.e., NeilMed) is recommended as needed and prior to medicated nasal sprays.  Hives/swelling:  Continue zyrtec (cetirizine) '10mg'$  twice a day. If symptoms are not controlled or causes drowsiness let us know. Restart Pepcid (famotidine) '20mg'$  twice a day.  May take benadryl '25mg'$  to '50mg'$  every 4-6 hours additionally as needed for flares. Avoid the following potential triggers: alcohol, tight clothing, NSAIDs, hot showers and getting overheated.  Environmental allergies: 2022 skin testing was positive to cat.  Continue environmental control measures. Use over the counter antihistamines such as Zyrtec (cetirizine), Claritin (loratadine), Allegra (fexofenadine), or Xyzal (levocetirizine) daily as needed. May take twice a day during allergy flares. May switch antihistamines every few months.  Asthma: Daily controller medication(s): none During upper respiratory infections/asthma flares:  Start Symbicort 7mg 2 puffs twice a day and rinse mouth afterwards for 1-2 weeks until your breathing symptoms return to baseline.  May use albuterol rescue inhaler 2 puffs every 4 to 6 hours as needed for shortness of breath, chest tightness, coughing, and wheezing. May use albuterol rescue inhaler 2 puffs 5 to 15 minutes prior to strenuous physical activities. Monitor frequency of use.  Asthma control goals:  Full participation in all desired activities (may need albuterol before activity) Albuterol use two times or less a week on average (not counting use with activity) Cough interfering  with sleep two times or less a month Oral steroids no more than once a year No hospitalizations  Bee stings: Continue to avoid.  Follow up in 4 months or sooner if needed.

## 2021-08-12 NOTE — Assessment & Plan Note (Signed)
Past history - Rhino conjunctivitis symptoms mainly in the spring and fall and around close cat contact. Deviated septum and nasal polypectomy surgery in the past.1 cat at home. 2022 skin prick testing was only positive to cats. 2022 bloodwork positive to cat and dog.  Interim history - finished Augmentin and prednisone but having some sinus pressures, mucous is no longer discolored.  Start prednisone taper. . Start Ryaltris (olopatadine + mometasone nasal spray combination) 1-2 sprays per nostril twice a day. Sample given. . Nasal saline spray (i.e., Simply Saline) or nasal saline lavage (i.e., NeilMed) is recommended as needed and prior to medicated nasal sprays.  Continue environmental control measures.  Use over the counter antihistamines such as Zyrtec (cetirizine), Claritin (loratadine), Allegra (fexofenadine), or Xyzal (levocetirizine) daily as needed. May take twice a day during allergy flares. May switch antihistamines every few months.

## 2021-08-13 ENCOUNTER — Telehealth (INDEPENDENT_AMBULATORY_CARE_PROVIDER_SITE_OTHER): Payer: Commercial Managed Care - HMO

## 2021-08-13 ENCOUNTER — Ambulatory Visit: Payer: Commercial Managed Care - HMO

## 2021-08-13 VITALS — BP 145/90 | HR 95

## 2021-08-13 DIAGNOSIS — Z0130 Encounter for examination of blood pressure without abnormal findings: Secondary | ICD-10-CM

## 2021-08-13 NOTE — Progress Notes (Signed)
Blood Pressure Check Visit  I connected with  Krystal [name] on 08/13/21 at  1:30 PM EDT by mychart video and verified that I am speaking with the correct person using two identifiers.   Krystal [name] has a video visit today for blood pressure check following repeat C-section on 07/21/21. Per patient, her BP today is 145/90. Patient endorses on and off blurred vision, headache, and pain under her right breast. Patient takes Norvasc 10 mg daily, Hydralazine 100 mg TID and  Labetalol 200 mg BID. Patient states her blood pressures at home have typically been 140s/90s however, per patient her BP yesterday was 166/114 in the afternoon and 157/106 in the evening. Patient states she has tried taking Tylenol and Ibuprofen to aid with headaches. Patient states sometimes the medications help with the headaches and sometimes they do not.  I reviewed all of this with Dr. Dione Plover. Per Dr. Dione Plover patient should come into the office to have her BP checked and labs drawn. I reviewed this with the patient. Patient states she is not able to come into the office today due to lack of transportation and childcare but patient states she could come in tomorrow. Appointment for tomorrow scheduled. I instructed patient to bring her blood pressure cuff to her appointment so we can check it alongside ours. Dr. Dione Plover instructed that if patient is experiencing pre-eclampsia symptoms with elevated blood pressure readings she should go to the Maternity Assessment Unit at the [facility]. I reviewed all of this with the patient. Patient states she will come to appointment tomorrow for blood pressure check. Patient also verbalized understanding to go to [facility] if symptomatic with elevated blood pressure reading.   Seth Bake, RN 08/13/2021

## 2021-08-14 ENCOUNTER — Encounter (HOSPITAL_COMMUNITY): Payer: Self-pay | Admitting: Obstetrics and Gynecology

## 2021-08-14 ENCOUNTER — Other Ambulatory Visit: Payer: Self-pay

## 2021-08-14 ENCOUNTER — Inpatient Hospital Stay (HOSPITAL_COMMUNITY)
Admission: AD | Admit: 2021-08-14 | Discharge: 2021-08-14 | Disposition: A | Discharge status: Home / Self Care | Payer: Commercial Managed Care - HMO | Attending: Obstetrics and Gynecology | Admitting: Obstetrics and Gynecology

## 2021-08-14 ENCOUNTER — Ambulatory Visit (INDEPENDENT_AMBULATORY_CARE_PROVIDER_SITE_OTHER): Payer: Commercial Managed Care - HMO

## 2021-08-14 VITALS — BP 160/95 | HR 69 | Wt 225.7 lb

## 2021-08-14 DIAGNOSIS — I10 Essential (primary) hypertension: Secondary | ICD-10-CM

## 2021-08-14 DIAGNOSIS — Z7901 Long term (current) use of anticoagulants: Secondary | ICD-10-CM | POA: Diagnosis not present

## 2021-08-14 DIAGNOSIS — G8929 Other chronic pain: Secondary | ICD-10-CM | POA: Diagnosis not present

## 2021-08-14 DIAGNOSIS — Z79899 Other long term (current) drug therapy: Secondary | ICD-10-CM | POA: Insufficient documentation

## 2021-08-14 DIAGNOSIS — R519 Headache, unspecified: Secondary | ICD-10-CM | POA: Diagnosis not present

## 2021-08-14 DIAGNOSIS — R030 Elevated blood-pressure reading, without diagnosis of hypertension: Secondary | ICD-10-CM

## 2021-08-14 LAB — COMPREHENSIVE METABOLIC PANEL
ALT: 18 U/L (ref 0–44)
AST: 19 U/L (ref 15–41)
Albumin: 3.5 g/dL (ref 3.5–5.0)
Alkaline Phosphatase: 49 U/L (ref 38–126)
Anion gap: 9 (ref 5–15)
BUN: 19 mg/dL (ref 6–20)
CO2: 27 mmol/L (ref 22–32)
Calcium: 9.4 mg/dL (ref 8.9–10.3)
Chloride: 102 mmol/L (ref 98–111)
Creatinine, Ser: 1 mg/dL (ref 0.44–1.00)
GFR, Estimated: >60 mL/min (ref >60)
Glucose, Bld: 87 mg/dL (ref 70–99)
Potassium: 3.7 mmol/L (ref 3.5–5.1)
Sodium: 138 mmol/L (ref 135–145)
Total Bilirubin: 0.4 mg/dL (ref 0.3–1.2)
Total Protein: 5.7 g/dL — ABNORMAL LOW (ref 6.5–8.1)

## 2021-08-14 LAB — URINALYSIS, ROUTINE W REFLEX MICROSCOPIC
Bilirubin Urine: NEGATIVE
Glucose, UA: NEGATIVE mg/dL
Ketones, ur: NEGATIVE mg/dL
Nitrite: NEGATIVE
Protein, ur: NEGATIVE mg/dL
Specific Gravity, Urine: 1.004 — ABNORMAL LOW (ref 1.005–1.030)
pH: 7 (ref 5.0–8.0)

## 2021-08-14 LAB — CBC
HCT: 35 % — ABNORMAL LOW (ref 36.0–46.0)
Hemoglobin: 11.7 g/dL — ABNORMAL LOW (ref 12.0–15.0)
MCH: 33.9 pg (ref 26.0–34.0)
MCHC: 33.4 g/dL (ref 30.0–36.0)
MCV: 101.4 fL — ABNORMAL HIGH (ref 80.0–100.0)
Platelets: 326 10*3/uL (ref 150–400)
RBC: 3.45 MIL/uL — ABNORMAL LOW (ref 3.87–5.11)
RDW: 14.1 % (ref 11.5–15.5)
WBC: 8.3 10*3/uL (ref 4.0–10.5)
nRBC: 0 % (ref 0.0–0.2)

## 2021-08-14 MED ORDER — NAPROXEN 500 MG PO TABS: 500.0000 mg | ORAL_TABLET | Freq: Two times a day (BID) | ORAL | 0 refills | Status: DC

## 2021-08-14 MED ORDER — CYCLOBENZAPRINE HCL 5 MG PO TABS
10.0000 mg | ORAL_TABLET | Freq: Once | ORAL | Status: DC
Start: 2021-08-14 — End: 2021-08-14

## 2021-08-14 MED ORDER — PROMETHAZINE HCL 25 MG PO TABS
25.0000 mg | ORAL_TABLET | Freq: Once | ORAL | Status: AC
  Administered 2021-08-14: 25 mg via ORAL
  Filled 2021-08-14: qty 1

## 2021-08-14 MED ORDER — DEXAMETHASONE SODIUM PHOSPHATE 10 MG/ML IJ SOLN
10.0000 mg | Freq: Once | INTRAMUSCULAR | Status: AC
Start: 2021-08-14 — End: 2021-08-14
  Administered 2021-08-14: 10 mg via INTRAVENOUS
  Filled 2021-08-14: qty 1

## 2021-08-14 MED ORDER — HYDROCHLOROTHIAZIDE 25 MG PO TABS
25.0000 mg | ORAL_TABLET | Freq: Once | ORAL | Status: DC
  Filled 2021-08-14: qty 1

## 2021-08-14 MED ORDER — LABETALOL HCL 5 MG/ML IV SOLN: 20.0000 mg | INTRAVENOUS | Status: DC | PRN

## 2021-08-14 MED ORDER — KETOROLAC TROMETHAMINE 30 MG/ML IJ SOLN
30.0000 mg | Freq: Once | INTRAMUSCULAR | Status: AC
  Administered 2021-08-14: 30 mg via INTRAVENOUS
  Filled 2021-08-14: qty 1

## 2021-08-14 MED ORDER — LABETALOL HCL 5 MG/ML IV SOLN
INTRAVENOUS | Status: AC
  Administered 2021-08-14: 20 mg
  Filled 2021-08-14: qty 4

## 2021-08-14 MED ORDER — LABETALOL HCL 5 MG/ML IV SOLN
40.0000 mg | INTRAVENOUS | Status: DC | PRN
  Administered 2021-08-14: 40 mg via INTRAVENOUS
  Filled 2021-08-14: qty 8

## 2021-08-14 MED ORDER — IBUPROFEN 600 MG PO TABS: 600.0000 mg | ORAL_TABLET | Freq: Four times a day (QID) | ORAL | 0 refills | Status: DC | PRN

## 2021-08-14 MED ORDER — LACTATED RINGERS IV BOLUS
1000.0000 mL | Freq: Once | INTRAVENOUS | Status: AC
  Administered 2021-08-14: 1000 mL via INTRAVENOUS

## 2021-08-14 MED ORDER — LABETALOL HCL 200 MG PO TABS: 400.0000 mg | ORAL_TABLET | Freq: Two times a day (BID) | ORAL | 0 refills | Status: DC

## 2021-08-14 MED ORDER — LABETALOL HCL 5 MG/ML IV SOLN: 80.0000 mg | INTRAVENOUS | Status: DC | PRN

## 2021-08-14 MED ORDER — HYDRALAZINE HCL 20 MG/ML IJ SOLN
10.0000 mg | INTRAMUSCULAR | Status: DC | PRN
  Administered 2021-08-14: 10 mg via INTRAVENOUS
  Filled 2021-08-14: qty 1

## 2021-08-14 NOTE — Progress Notes (Signed)
Blood Pressure Check Visit  Krystal [name] is here for a blood pressure check following up from a mychart video visit yesterday 08/13/21. Patient had a repeat C-section on 07/21/21. BP today is 163/109. Blood pressure recheck approximately 5-10 minutes later was 160/95. Patient endorses headache, pain under right breast and peripheral edema. Patient takes 10 mg Norvasc daily, Hydralazine 100 mg TID, and Labetalol 200 mg BID. Per patient she has taken both Tylenol and Ibuprofen this morning for the headache but neither have helped. Patient rates headache 6/10. Reviewed with Dr. Dione Plover. Per Dr. Dione Plover patient to go to the Maternity Assessment Unit at the [facility] for evaluation and treatment. I reviewed this with the patient. Patient verbalized understanding.  Seth Bake, RN 08/14/2021

## 2021-08-14 NOTE — MAU Provider Note (Signed)
History    [id number]  Arrival date and time: 08/14/21 1310   Chief Complaint  Patient presents with   Headache   Hypertension   HPI Krystal [name] is a 31 y.o. [id number] who is s/p cesarean section on 07/21/21 who presents to MAU with headache and HTN.   These are both chronic issues for patient. 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 She also has chronic migraines that were refractory during her pregnancy as well.  She presented to the office today for BP check and had blood pressures in the 160s/90s-100s. She also reported a headache that did not improve after taking Tylenol and Ibuprofen at home.  Currently, she reports having a headache that is distributed circumferentially around her head, but slightly worse on the left side. She also is reporting some spotty vision. She denies nausea/vomiting.   She has no other concerns at this time.   --/--/O POS (05/28 1745)  OB History     Gravida  5   Para  3   Term  0   Preterm  3   AB  2   Living  2      SAB  2   IAB  0   Ectopic  0   Multiple  0   Live Births  2           Past Medical History:  Diagnosis Date   Acute acalculous cholecystitis s/p lap cholecystectomy 05/11/2020 05/11/2020   Angio-edema 01/15/2021   Anxiety    Asthma    HELLP (hemolytic anemia/elev liver enzymes/low platelets in pregnancy) 12/08/2017   IC (interstitial cystitis)    Kidney stones    Migraines    Necrotizing fasciitis ([place])    left hand amputation   Severe uncontrolled hypertension 10/08/2017   UTI (urinary tract infection)     Past Surgical History:  Procedure Laterality Date   APPENDECTOMY     BIOPSY  05/10/2020   Procedure: BIOPSY;  Surgeon: Arta Silence, MD;  Location: [facility];  Service: Endoscopy;;   CESAREAN SECTION N/A 12/06/2017   Procedure: CESAREAN SECTION;  Surgeon: Sloan Leiter, MD;  Location: [place];  Service: Obstetrics;   Laterality: N/A;   CESAREAN SECTION  07/21/2021   Procedure: CESAREAN SECTION;  Surgeon: Donnamae Jude, MD;  Location: [facility];  Service: Obstetrics;;   CHOLECYSTECTOMY N/A 05/11/2020   Procedure: SINGLE SITE LAPAROSCOPIC CHOLECYSTECTOMY AND LIVER BIOSY;  Surgeon: Michael Boston, MD;  Location: [facility];  Service: General;  Laterality: N/A;   ESOPHAGOGASTRODUODENOSCOPY (EGD) WITH PROPOFOL N/A 05/10/2020   Procedure: ESOPHAGOGASTRODUODENOSCOPY (EGD) WITH PROPOFOL;  Surgeon: Arta Silence, MD;  Location: [facility];  Service: Endoscopy;  Laterality: N/A;   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   HAND RECONSTRUCTION Right    INCISION AND DRAINAGE     NASAL SEPTUM SURGERY      Family History  Problem Relation Age of Onset   Hypertension Mother    Hypertension Father     Social History   Socioeconomic History   Marital status: Married    Spouse name: Not on file   Number of children: Not on file   Years of education: Not on file   Highest education level: Not on file  Occupational History   Not on file  Tobacco Use   Smoking status: Never   Smokeless tobacco: Never   Tobacco comments:    She tried a  cigarette and never smoked again  Vaping Use   Vaping Use: Never used  Substance and Sexual Activity   Alcohol use: Not Currently    Comment: bottle of wine a week-occ   Drug use: No   Sexual activity: Not Currently  Other Topics Concern   Not on file  Social History Narrative   Not on file   Social Determinants of Health   Financial Resource Strain: Not on file  Food Insecurity: No Food Insecurity (06/13/2021)   Hunger Vital Sign    Worried About Running Out of Food in the Last Year: Never true    Ran Out of Food in the Last Year: Never true  Transportation Needs: Unmet Transportation Needs (06/13/2021)   PRAPARE - Hydrologist (Medical): Yes    Lack of Transportation (Non-Medical): Yes  Physical Activity: Not on file  Stress:  Not on file  Social Connections: Not on file  Intimate Partner Violence: Not on file    Allergies  Allergen Reactions   Red Dye Other (See Comments)    ALL CAUSE PROBLEMS #40 IS WORST-MIGRAINES    Compazine [Prochlorperazine Maleate] Other (See Comments)    TWITCHING, CANT STAY STILL. Pt states she can tolerate promethazine   Other Other (See Comments)    FANSIDAR FOR MALARIA-ASTHMA   Vicodin [Hydrocodone-Acetaminophen] Itching, Nausea And Vomiting and Other (See Comments)    Pt states she can tolerate acetaminophen   Amitriptyline Hcl Other (See Comments)    Angry moods   Duloxetine Hcl Other (See Comments)    Passing out, trimble, irritable   Lisinopril Cough   Reglan [Metoclopramide] Other (See Comments)    restless   Nifedipine Nausea And Vomiting and Other (See Comments)    Discussed with patient and maternal fetal medicine, patient has intolerance but not frank allergy.    No current facility-administered medications on file prior to encounter.   Current Outpatient Medications on File Prior to Encounter  Medication Sig Dispense Refill   acetaminophen (TYLENOL) 500 MG tablet Take 2 tablets (1,000 mg total) by mouth every 6 (six) hours as needed. 30 tablet 0   albuterol (PROVENTIL HFA;VENTOLIN HFA) 108 (90 Base) MCG/ACT inhaler Inhale 2 puffs into the lungs every 6 (six) hours as needed for wheezing or shortness of breath.     amLODipine (NORVASC) 10 MG tablet Take 1 tablet (10 mg total) by mouth daily. 90 tablet 2   budesonide-formoterol (SYMBICORT) 80-4.5 MCG/ACT inhaler Inhale 2 puffs into the lungs in the morning and at bedtime. Take for 1-2 weeks at a time during asthma flare. Rinse mouth after each use. 1 each 3   cetirizine (ZYRTEC ALLERGY) 10 MG tablet Take 1 tablet (10 mg total) by mouth 2 (two) times daily. 60 tablet 5   EPINEPHrine 0.3 mg/0.3 mL IJ SOAJ injection Inject 0.3 mg into the muscle as needed for anaphylaxis. 1 each 2   famotidine (PEPCID) 20 MG tablet  Take 20 mg by mouth 2 (two) times daily. (Patient not taking: Reported on 08/14/2021)     furosemide (LASIX) 20 MG tablet Take 1 tablet (20 mg total) by mouth daily for 3 days. 3 tablet 0   hydrALAZINE (APRESOLINE) 100 MG tablet Take 1 tablet (100 mg total) by mouth 3 (three) times daily. 90 tablet 1   levothyroxine (SYNTHROID) 125 MCG tablet Take 1 tablet (125 mcg total) by mouth daily before breakfast. 45 tablet 3   LORazepam (ATIVAN) 1 MG tablet Take 2 tablets (2  mg total) by mouth every 6 (six) hours as needed for anxiety. 24 tablet 0   pantoprazole (PROTONIX) 40 MG tablet Take 1 tablet (40 mg total) by mouth daily. 30 tablet 4   Prenatal Multivit-Min-Fe-FA (PRE-NATAL PO) Take 1 tablet by mouth daily.     venlafaxine XR (EFFEXOR-XR) 75 MG 24 hr capsule Take 3 capsules (225 mg total) by mouth daily with breakfast. 90 capsule 1   ROS Pertinent positives and negative per HPI, all others reviewed and negative.  Physical Exam   BP (!) 142/84   Pulse 66   Temp 98.5 F (36.9 C) (Oral)   Resp 16   Ht '5\' 11"'$  (1.803 m)   Wt 103.2 kg   SpO2 97% Comment: room air  Breastfeeding Yes Comment: pumping  BMI 31.73 kg/m   Physical Exam Constitutional:      General: She is not in acute distress.    Appearance: Normal appearance.  HENT:     Head: Normocephalic and atraumatic.  Eyes:     Extraocular Movements: Extraocular movements intact.     Conjunctiva/sclera: Conjunctivae normal.  Cardiovascular:     Rate and Rhythm: Normal rate and regular rhythm.     Heart sounds: No murmur heard. Pulmonary:     Effort: Pulmonary effort is normal.     Breath sounds: Normal breath sounds. No wheezing or rhonchi.  Abdominal:     Palpations: Abdomen is soft.     Tenderness: There is no abdominal tenderness.  Musculoskeletal:        General: Normal range of motion.     Cervical back: Normal range of motion.     Right lower leg: No edema.     Left lower leg: No edema.  Skin:    General: Skin is warm  and dry.  Neurological:     General: No focal deficit present.     Mental Status: She is alert and oriented to person, place, and time.  Psychiatric:        Mood and Affect: Mood normal.        Behavior: Behavior normal.    Labs Results for orders placed or performed during the hospital encounter of 08/14/21 (from the past 24 hour(s))  Urinalysis, Routine w reflex microscopic Urine, Clean Catch     Status: Abnormal   Collection Time: 08/14/21  1:48 PM  Result Value Ref Range   Color, Urine YELLOW YELLOW   APPearance HAZY (A) CLEAR   Specific Gravity, Urine 1.004 (L) 1.005 - 1.030   pH 7.0 5.0 - 8.0   Glucose, UA NEGATIVE NEGATIVE mg/dL   Hgb urine dipstick LARGE (A) NEGATIVE   Bilirubin Urine NEGATIVE NEGATIVE   Ketones, ur NEGATIVE NEGATIVE mg/dL   Protein, ur NEGATIVE NEGATIVE mg/dL   Nitrite NEGATIVE NEGATIVE   Leukocytes,Ua SMALL (A) NEGATIVE   RBC / HPF 21-50 0 - 5 RBC/hpf   WBC, UA 6-10 0 - 5 WBC/hpf   Bacteria, UA RARE (A) NONE SEEN   Squamous Epithelial / LPF 0-5 0 - 5  CBC     Status: Abnormal   Collection Time: 08/14/21  2:30 PM  Result Value Ref Range   WBC 8.3 4.0 - 10.5 K/uL   RBC 3.45 (L) 3.87 - 5.11 MIL/uL   Hemoglobin 11.7 (L) 12.0 - 15.0 g/dL   HCT 35.0 (L) 36.0 - 46.0 %   MCV 101.4 (H) 80.0 - 100.0 fL   MCH 33.9 26.0 - 34.0 pg   MCHC 33.4 30.0 -  36.0 g/dL   RDW 14.1 11.5 - 15.5 %   Platelets 326 150 - 400 K/uL   nRBC 0.0 0.0 - 0.2 %  Comprehensive metabolic panel     Status: Abnormal   Collection Time: 08/14/21  2:30 PM  Result Value Ref Range   Sodium 138 135 - 145 mmol/L   Potassium 3.7 3.5 - 5.1 mmol/L   Chloride 102 98 - 111 mmol/L   CO2 27 22 - 32 mmol/L   Glucose, Bld 87 70 - 99 mg/dL   BUN 19 6 - 20 mg/dL   Creatinine, Ser 1.00 0.44 - 1.00 mg/dL   Calcium 9.4 8.9 - 10.3 mg/dL   Total Protein 5.7 (L) 6.5 - 8.1 g/dL   Albumin 3.5 3.5 - 5.0 g/dL   AST 19 15 - 41 U/L   ALT 18 0 - 44 U/L   Alkaline Phosphatase 49 38 - 126 U/L   Total  Bilirubin 0.4 0.3 - 1.2 mg/dL   GFR, Estimated >60 >60 mL/min   Anion gap 9 5 - 15   Imaging No results found.  MAU Course  Procedures Lab Orders         Urinalysis, Routine w reflex microscopic Urine, Clean Catch         CBC         Comprehensive metabolic panel     Meds ordered this encounter  Medications   lactated ringers bolus 1,000 mL   AND Linked Order Group    labetalol (NORMODYNE) injection 20 mg    labetalol (NORMODYNE) injection 40 mg    labetalol (NORMODYNE) injection 80 mg    hydrALAZINE (APRESOLINE) injection 10 mg   labetalol (NORMODYNE) 5 MG/ML injection    Aube, Melissa M: cabinet override   promethazine (PHENERGAN) tablet 25 mg   dexamethasone (DECADRON) injection 10 mg   DISCONTD: hydrochlorothiazide (HYDRODIURIL) tablet 25 mg   ketorolac (TORADOL) 30 MG/ML injection 30 mg   DISCONTD: cyclobenzaprine (FLEXERIL) tablet 10 mg   labetalol (NORMODYNE) 200 MG tablet    Sig: Take 2 tablets (400 mg total) by mouth 2 (two) times daily.    Dispense:  120 tablet    Refill:  0   DISCONTD: naproxen (NAPROSYN) 500 MG tablet    Sig: Take 1 tablet (500 mg total) by mouth 2 (two) times daily with a meal.    Dispense:  30 tablet    Refill:  0   ibuprofen (ADVIL) 600 MG tablet    Sig: Take 1 tablet (600 mg total) by mouth every 6 (six) hours as needed for fever, headache or moderate pain.    Dispense:  40 tablet    Refill:  0   Imaging Orders  No imaging studies ordered today   MDM Upon arrival, no acute distress, hypertensive with BP 170s/100s Exam as above unremarkable, neurologically intact with no gross deficits  BP treated with IV Labetalol and Hydralazine with good result CBC and CMP stable, UA unremarkable  Given IVF, Phenergan, Decadron, and Toradol for her headache; patient reporting minimal improvement  Has failed multiple alternative treatment methods previously in her pregnancy  MR brain with MRA/MRV normal on 07/02/21 Discussed with on-call Neurology  Dr. Olegario Messier who recommended further outpatient Neurology follow up for refractory headaches  Plan of care also discussed with Dr. Damita Dunnings. Acute on chronic exacerbation of cHTN and chronic headaches. Will need both Cardiology and Neurology follow up. Plan to titrate BP regimen in the interim. Discussed initiation of HCTZ, but patient  declined due to concern regarding effects on lactation.  Will increase Labetalol for now until Cardiology follow up. All questions and concerns addressed, patient stable for further outpatient management.   Assessment and Plan   1. Poorly-controlled hypertension - Continue Amlodipine 10 mg daily and Hydralazine 100 mg BID - Increase Labetalol to 400 mg BID - Referral placed to Cardiology for follow up of poorly controlled HTN - Return precautions reviewed   2. Chronic intractable headache, unspecified headache type - Referral placed to outpatient Neurology for refractory headache management  - Will continue Tylenol and Ibuprofen PRN for headaches at this time  - Return precautions reviewed should her symptoms acutely worsen - Patient voiced understanding and is agreeable to plan   Genia Del, MD

## 2021-08-14 NOTE — MAU Note (Signed)
Krystal [name] is a 31 y.o. here in MAU reporting: ongoing hypertension, headache, and blurry vision since previous visit. Is taking hydralazine, amlodipine, and labetalol.   Onset of complaint: ongoing  Pain score: 8/10  Vitals:   08/14/21 1330  BP: (!) 173/108  Pulse: 61  Resp: 16  Temp: 98.5 F (36.9 C)  SpO2: 97%     Lab orders placed from triage: UA

## 2021-08-15 LAB — COMPREHENSIVE METABOLIC PANEL
ALT: 17 IU/L (ref 0–32)
AST: 14 IU/L (ref 0–40)
Albumin/Globulin Ratio: 1.9 (ref 1.2–2.2)
Albumin: 4 g/dL (ref 3.8–4.8)
Alkaline Phosphatase: 67 IU/L (ref 44–121)
BUN/Creatinine Ratio: 20 (ref 9–23)
BUN: 19 mg/dL (ref 6–20)
Bilirubin Total: <0.2 mg/dL (ref 0.0–1.2)
CO2: 24 mmol/L (ref 20–29)
Calcium: 9.6 mg/dL (ref 8.7–10.2)
Chloride: 102 mmol/L (ref 96–106)
Creatinine, Ser: 0.97 mg/dL (ref 0.57–1.00)
Globulin, Total: 2.1 g/dL (ref 1.5–4.5)
Glucose: 78 mg/dL (ref 70–99)
Potassium: 4.4 mmol/L (ref 3.5–5.2)
Sodium: 141 mmol/L (ref 134–144)
Total Protein: 6.1 g/dL (ref 6.0–8.5)
eGFR: 80 mL/min/{1.73_m2} (ref >59)

## 2021-08-15 LAB — CBC
Hematocrit: 35.2 % (ref 34.0–46.6)
Hemoglobin: 11.6 g/dL (ref 11.1–15.9)
MCH: 32.7 pg (ref 26.6–33.0)
MCHC: 33 g/dL (ref 31.5–35.7)
MCV: 99 fL — ABNORMAL HIGH (ref 79–97)
Platelets: 314 10*3/uL (ref 150–450)
RBC: 3.55 x10E6/uL — ABNORMAL LOW (ref 3.77–5.28)
RDW: 12.9 % (ref 11.7–15.4)
WBC: 7.7 10*3/uL (ref 3.4–10.8)

## 2021-08-19 ENCOUNTER — Ambulatory Visit: Payer: Managed Care, Other (non HMO) | Admitting: Allergy

## 2021-08-19 NOTE — BH Specialist Note (Deleted)
Integrated Behavioral Health via Telemedicine Visit  08/19/2021 Krystal [name] [id number]  Number of [address] Clinician visits: 3- Third Visit  Session Start time: 4163   Session End time: 8453  Total time in minutes: 35   Referring Provider: *** Patient/Family location: *** [facility] Provider location: *** All persons participating in visit: *** Types of Service: {CHL AMB TYPE OF SERVICE:[phone]}  I connected with Krystal [name] and/or Krystal [name]'s {family members:20773} via  Telephone or Video Enabled Telemedicine Application  (Video is Caregility application) and verified that I am speaking with the correct person using two identifiers. Discussed confidentiality: {YES/NO:21197}  I discussed the limitations of telemedicine and the availability of in person appointments.  Discussed there is a possibility of technology failure and discussed alternative modes of communication if that failure occurs.  I discussed that engaging in this telemedicine visit, they consent to the provision of behavioral healthcare and the services will be billed under their insurance.  Patient and/or legal guardian expressed understanding and consented to Telemedicine visit: {YES/NO:21197}  Presenting Concerns: Patient and/or family reports the following symptoms/concerns: *** Duration of problem: ***; Severity of problem: {Mild/Moderate/Severe:20260}  Patient and/or Family's Strengths/Protective Factors: {CHL AMB BH PROTECTIVE FACTORS:[phone]}  Goals Addressed: Patient will:  Reduce symptoms of: {IBH Symptoms:21014056}   Increase knowledge and/or ability of: {IBH Patient Tools:21014057}   Demonstrate ability to: {IBH Goals:21014053}  Progress towards Goals: {CHL AMB BH PROGRESS TOWARDS GOALS:[phone]}  Interventions: Interventions utilized:  {IBH Interventions:21014054} Standardized Assessments completed: {IBH Screening Tools:21014051}  Patient  and/or Family Response: ***  Assessment: Patient currently experiencing ***.   Patient may benefit from ***.  Plan: Follow up with behavioral health clinician on : *** Behavioral recommendations: *** Referral(s): {IBH Referrals:21014055}  I discussed the assessment and treatment plan with the patient and/or parent/guardian. They were provided an opportunity to ask questions and all were answered. They agreed with the plan and demonstrated an understanding of the instructions.   They were advised to call back or seek an in-person evaluation if the symptoms worsen or if the condition fails to improve as anticipated.  Caroleen Hamman Krystal Filosa, LCSW

## 2021-08-21 ENCOUNTER — Telehealth: Payer: Self-pay | Admitting: *Deleted

## 2021-08-21 NOTE — Telephone Encounter (Signed)
Spoke with the pt.  Had to schedule her to be seen at [facility] clinic with Dr. Johney Frame on 09/03/21 at 3:30 pm.  Pt is familiar with [facility] location.  Pt aware to arrive 15 mins prior to this appt.  Pt verbalized understanding and agrees with this plan. Will send Dr. Johney Frame a staff message to inform her that pt will be seen at [facility] on 7/11 vs [facility].

## 2021-08-21 NOTE — Telephone Encounter (Signed)
-----   Message from Freada Bergeron, MD sent at 08/21/2021  5:23 PM EDT ----- Can we get her scheduled to see [place] in [place] clinic for HTN?  Thank you both!!  -Nira Conn

## 2021-08-26 ENCOUNTER — Telehealth: Payer: Self-pay | Admitting: Psychiatry

## 2021-08-26 ENCOUNTER — Ambulatory Visit: Payer: Commercial Managed Care - HMO | Admitting: Psychiatry

## 2021-08-26 NOTE — Telephone Encounter (Signed)
LVM and sent mychart msg informing pt of appointment cancellation- MD out sick.

## 2021-08-29 ENCOUNTER — Encounter: Payer: Self-pay | Admitting: Family Medicine

## 2021-08-29 ENCOUNTER — Ambulatory Visit (INDEPENDENT_AMBULATORY_CARE_PROVIDER_SITE_OTHER): Payer: Commercial Managed Care - HMO | Admitting: Family Medicine

## 2021-08-29 ENCOUNTER — Ambulatory Visit: Payer: Self-pay

## 2021-08-29 ENCOUNTER — Other Ambulatory Visit: Payer: Self-pay

## 2021-08-29 DIAGNOSIS — R32 Unspecified urinary incontinence: Secondary | ICD-10-CM

## 2021-08-29 DIAGNOSIS — F419 Anxiety disorder, unspecified: Secondary | ICD-10-CM

## 2021-08-29 DIAGNOSIS — N3010 Interstitial cystitis (chronic) without hematuria: Secondary | ICD-10-CM

## 2021-08-29 NOTE — Progress Notes (Signed)
[place] Partum Visit Note  Krystal [name] is a 31 y.o. [id number] female who presents for a postpartum visit. Patient with a hx of HELLP syndrome, severe anxiety/panic attacks, migraines, and interstitial cystitis She is 5 weeks postpartum following a repeat cesarean section.  I have fully reviewed the prenatal and intrapartum course. The delivery was at 26.1 gestational weeks.  Anesthesia: spinal. Postpartum course has been overall ok medically, but having some urinary symptoms, incision still healing, and have had social and mental stressors. Baby is still in NICU and for feeding/growing. Baby is feeding by breast. Bleeding staining only. Bowel function is normal. Bladder function is  having some leaking but not constant and some pain with urination (see below) . Patient is not sexually active. Contraception method is tubal ligation. Postpartum depression screening: negative.   #Hx of HELLP syndrome  - received Mg in hospital - patient on Amlodipine '10mg'$ , Hydral TID, and Labetalol '300mg'$  BID - patient went to MAU previously for elevated BP and her Labetalol was increased to 400 BID - She reports that brought her BP down to 120s/80s but she felt like she was bottoming out - so she decreased back '300mg'$  BID (self) - she reports at home BP mostly in 130s-140s except for past few days they have been higher in the severe ranges - no headache at this time but has had some (and has hx of intractable HA and has neurology appt)   #Urinary symptoms Pain with urination - started a few days after she was discharge Pain moves up to abdomen Pain is at 7-10/10 Still having post partum bleeding so unsure if having blood in urine Previously diagnosed with interstitial cystitis - [place] - was on gabapentin and did not like reaction. Also was on another medication but she couldn't remember which one.  She states these symptoms  same as her IC symptoms She is also having some  intermittent incontinence   #Panic/mood symptoms - depression is overall well controlled with effexor- would like refills - Anxiety is pretty significant in setting of social stressors - Would like refill of PRN ativan - we discussed ok for refill for now but discussed psychiatry referral for further management of complex psychiatric diagnosis and patient amenable - seeing Roselyn Reef for counseling and has upcoming appt     Edinburgh Postnatal Depression Scale - 08/29/21 1616       Edinburgh Postnatal Depression Scale:  In the Past 7 Days   I have been able to laugh and see the funny side of things. 0    I have looked forward with enjoyment to things. 0    I have blamed myself unnecessarily when things went wrong. 0    I have been anxious or worried for no good reason. 2    I have felt scared or panicky for no good reason. 0    Things have been getting on top of me. 2    I have been so unhappy that I have had difficulty sleeping. 0    I have felt sad or miserable. 2    I have been so unhappy that I have been crying. 1    The thought of harming myself has occurred to me. 0    Edinburgh Postnatal Depression Scale Total 7             Health Maintenance Due  Topic Date Due   COVID-19 Vaccine (3 - Pfizer risk series) 11/06/2019  The following portions of the patient's history were reviewed and updated as appropriate: allergies, current medications, past family history, past medical history, past social history, past surgical history, and problem list.  Review of Systems Pertinent items are noted in HPI.  Objective:  BP (!) 167/104   Pulse 67   Ht '5\' 11"'$  (1.803 m)   Wt 234 lb (106.1 kg)   Breastfeeding Yes   BMI 32.64 kg/m    General:  alert and not acute distress but tearful at times when discussing life and social stressors   Breasts:  not indicated  Lungs: clear to auscultation bilaterally  Heart:  regular rate and rhythm, S1, S2 normal, no murmur, click, rub or  gallop, mild edema non pitting  Abdomen: soft, non-tender; bowel sounds normal; no masses,  no organomegaly. No TTP of bladder.   Wound well approximated incision, some spots in further stages of healing than others. No evidence of dehiscence at this time  GU exam:  not indicated       Assessment:    1. Postpartum care and examination   #Hx of HELLP syndrome  #Hypertension Patient received Mg in hospital and is currently on Amlodipine '10mg'$ , Hydral TID, and Labetalol '300mg'$  BID. Labetalol was previously increased to 400 BID but due to hypotension symptoms with 120s/80s she felt like she was bottoming out and self decreased back '300mg'$  BID. Per patient report home BP mostly in 130s-140s except for past few days they have been higher in the severe ranges. No headache at this time but has had some (and has hx of intractable HA and has neurology appt). On exam some mild edema. Discussed with patient options of increasing Labetalol to '400mg'$  at night and 300 in AM. Patient amenable. Also discussed starting Lasix as patient states at night edema is worse but would like to just try the Labetalol increase at this time and hold off. - return/MAU precautions discussed in detail - cards appt next week  #Urinary symptoms Patient with burning toward abdomen after urination that started after hospital DC. She has hx of IC and states this is the same symptoms. Previously on Gabapentin and another medication but didn't like Gabapentin. Couldn't remember name of other medication. Also with some intermittent incontinence. Discussed trying azo or Pentosan with patient as per [facility] 1st line is OTC meds. However patient has red dye allergy and both meds are getting flagged. - also refer to pelvic floor PT - called patient back and discussed the allergy and she reports she gets headaches and given her hx of complex HA already will avoid - refer to Urology for further management and patient  amenable - UA/urine culture also ordered - patient to come back to lab next week to provide sample   #Panic/mood symptoms Depression well controlled with current dose of effexor- would like refills. Anxiety is pretty significant in setting of social stressors. No self harm or harmful ideations at this time - continue therapy with [facility] (short PRN course) - discussed ok for refill for now but discussed psychiatry referral for further management of complex psychiatric diagnoses and patient amenable - ED precautions provided    Plan:   Essential components of care per ACOG recommendations:  1.  Mood and well being: Patient with negative depression screening today. Reviewed local resources for support.  - Patient tobacco use? No.   - hx of drug use? No.    2. Infant care and feeding:  -Patient currently breastmilk  feeding? Yes. Reviewed importance of draining breast regularly to support lactation.  -Social determinants of health (SDOH) reviewed in EPIC.   3. Sexuality, contraception and birth spacing - Reviewed reproductive life planning. Patient had BTL during surgery  4. Sleep and fatigue -Encouraged family/partner/community support of 4 hrs of uninterrupted sleep to help with mood and fatigue  5. Physical Recovery  - Discussed patients delivery and complications.- Patient had a C-section for worsening HELLP syndrome.  - Patient has urinary incontinence? Yes. Offered PT and patient accepted. Patient was referred to pelvic floor PT.  - Recommended can slowly resume physical activity   6.  Health Maintenance - HM due items addressed Yes - Last pap smear  Diagnosis  Date Value Ref Range Status  04/01/2021   Final   - Negative for intraepithelial lesion or malignancy (NILM)   Pap smear not done at today's visit.  -Breast Cancer screening indicated? No.   7. Chronic Disease/Pregnancy Condition follow up:  elevated BP (uncontrolled), intractable HA, anxiety and  Panic attacks, urinary incontinence and suspected interstitial cystitis  - Cardiology, neurology follow up (already referred) - referred to pelvic floor PT - referred to psychiatry     Renard Matter, MD, MPH OB Fellow, Faculty [facility]

## 2021-08-29 NOTE — Lactation Note (Signed)
This note was copied from a baby's chart.  NICU Lactation Consultation Note  Patient Name: Krystal [name] Today's Date: 08/29/2021 Age:33 wk.o.   Subjective Reason for consult: Follow-up assessment; NICU baby; Preterm <34wks  I followed up with Krystal [name]. She reports that she feels that the symphony pump is not fully emptying her breasts. She often hand expresses after pumping to fully empty. We discussed trying to use her hand pump after pumping with the DEBP for a few moments to see if this would help to empty the breasts. Krystal [name] notes that it has been more difficult with this baby for MER with pumping. She has more volume after holding baby STS. We discussed the benefits of STS for oxytocin release.   I provided additional breast milk storage labels upon request. RN also provided information on the sibling visitation policy.  Objective Infant data: Mother's Current Feeding Choice: Breast Milk  Infant feeding assessment Scale for Readiness: 3  Maternal data: [id number]  C-Section, Low Transverse Pumped volume: 45 mL (1.5-4 ounces (more in the a.m.))   WIC Program: Yes WIC Referral Sent?: Yes Pump:  ([facility] - WIC pump)   Intervention/Plan Interventions: Breast feeding basics reviewed; Education  Tools: Other (comment) (labels) Pump Education: Setup, frequency, and cleaning  Plan: Consult Status: NICU follow-up  NICU Follow-up type: Weekly NICU follow up    Lenore Manner 08/29/2021, 6:23 PM

## 2021-08-30 MED ORDER — VENLAFAXINE HCL ER 75 MG PO CP24: 225.0000 mg | ORAL_CAPSULE | Freq: Every day | ORAL | 0 refills | Status: DC

## 2021-08-30 MED ORDER — LORAZEPAM 1 MG PO TABS: 2.0000 mg | ORAL_TABLET | Freq: Three times a day (TID) | ORAL | 0 refills | Status: DC | PRN

## 2021-08-30 MED ORDER — LABETALOL HCL 200 MG PO TABS
400.0000 mg | ORAL_TABLET | Freq: Two times a day (BID) | ORAL | 0 refills | Status: DC
Start: 2021-08-30 — End: 2021-09-19

## 2021-08-30 NOTE — Progress Notes (Deleted)
Cardio-Obstetrics Clinic  Follow Up Note   Date:  09/01/2021   ID:  Krystal [name], DOB [birth year], MRN [id number]  PCP:  Audley Hose, MD   [facility] Providers Cardiologist:  Freada Bergeron, MD  Electrophysiologist:  None      Referring MD: Audley Hose, MD   Chief Complaint: HTN  History of Present Illness:    Krystal [name] is a 31 y.o. female [G5P0322] who returns for follow up of HTN.  Patient with extensive history of HTN diagnosed in her teens. Reportedly had secondary work-up at [facility] which was unrevealing (records not available). Also with history of preeclampsia and HELLP syndrome with 2 prior pre-term births at 78w5dand 31w3dShe was followed throughout her third pregnancy for difficult to control HTN and persistent nausea and vomiting making it hard to to tolerate PO medications. She ultimately required hospitalization on 06/2021 for severe hypertension, anisocaria and HA. CT/MRI were negative for acute findings. She had a prolonged hospitalization complicated by persistent HTN, nausea/vomiting requiring NGT placement, severe anxiety/depression, and HELLP syndrome. She underwent emergent c-section with bilateral salpingectomy on 07/21/21. She was discharged POD #3.   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   Severe uncontrolled hypertension 10/08/2017   UTI (urinary tract infection)     Past Surgical History:  Procedure Laterality Date   APPENDECTOMY     BIOPSY  05/10/2020   Procedure: BIOPSY;  Surgeon: OuArta SilenceMD;   [facility];  Service: Endoscopy;;   CESAREAN SECTION N/A 12/06/2017   Procedure: CESAREAN SECTION;  Surgeon: DaSloan LeiterMD;  [facility] Service: Obstetrics;  Laterality: N/A;   CESAREAN SECTION  07/21/2021   Procedure: CESAREAN SECTION;  Surgeon: PrDonnamae JudeMD;  [facility];  Service: Obstetrics;;   CHOLECYSTECTOMY N/A 05/11/2020   Procedure: SINGLE SITE LAPAROSCOPIC CHOLECYSTECTOMY AND LIVER BIOSY;  Surgeon: GrMichael BostonMD;  [facility];  Service: General;  Laterality: N/A;   ESOPHAGOGASTRODUODENOSCOPY (EGD) WITH PROPOFOL N/A 05/10/2020   Procedure: ESOPHAGOGASTRODUODENOSCOPY (EGD) WITH PROPOFOL;  Surgeon: OuArta SilenceMD;  [facility];  Service: Endoscopy;  Laterality: N/A;   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     Allergies:   Red dye, Compazine [prochlorperazine maleate], Other, Vicodin [hydrocodone-acetaminophen], Amitriptyline hcl, Duloxetine hcl, Lisinopril, Reglan [metoclopramide], and Nifedipine   Social History   Socioeconomic History   Marital status: Married    Spouse name: Not on file   Number of children: Not on file   Years of education: Not on file   Highest education level: Not on file  Occupational History   Not on file  Tobacco Use   Smoking status: Never   Smokeless tobacco: Never   Tobacco comments:    She tried a cigarette and never smoked again   Vaping Use   Vaping Use: Never used  Substance and Sexual Activity   Alcohol use: Not Currently    Comment: bottle of wine a week-occ   Drug use: No   Sexual activity: Not Currently  Other Topics Concern   Not on file  Social History Narrative   Not on file   Social Determinants of Health   Financial Resource Strain: Not on file  Food Insecurity: No Food Insecurity (06/13/2021)   Hunger Vital Sign    Worried About Running Out of Food in the Last Year: Never true    Ran Out of Food in the Last Year: Never true  Transportation Needs: Unmet Transportation Needs (06/13/2021)   PRAPARE - Hydrologist (Medical): Yes    Lack of Transportation (Non-Medical): Yes  Physical Activity: Not on file  Stress: Not on file  Social Connections: Not on file  { Click here to update SDOH then refresh :1}    Family History  Problem Relation Age of Onset   Hypertension Mother    Hypertension Father    { Click here to update FH then refresh note    :1}   ROS:   Please see the history of present illness.    *** All other systems reviewed and are negative.   Labs/EKG Reviewed:    EKG:   EKG is *** ordered today.  The ekg ordered today demonstrates ***  Recent Labs: 06/10/2021: Magnesium 2.0; NT-Pro BNP 165 06/27/2021: B Natriuretic Peptide 28.9 07/03/2021: TSH 2.154 08/14/2021: ALT 18; BUN 19; Creatinine, Ser 1.00; Hemoglobin 11.7; Platelets 326; Potassium 3.7; Sodium 138   Recent Lipid Panel No results found for: "CHOL", "TRIG", "HDL", "CHOLHDL", "LDLCALC", "LDLDIRECT"  Physical Exam:    VS:  There were no vitals taken for this visit.    Wt Readings from Last 3 Encounters:  08/29/21 234 lb (106.1 kg)  08/14/21 227 lb 8 oz (103.2 kg)  08/14/21 225 lb 11.2 oz (102.4 kg)     GEN: *** Well nourished, well developed in no acute distress HEENT: Normal NECK: No JVD; No carotid bruits LYMPHATICS: No lymphadenopathy CARDIAC: ***RRR, no murmurs, rubs,  gallops RESPIRATORY:  Clear to auscultation without rales, wheezing or rhonchi  ABDOMEN: Soft, non-tender, non-distended MUSCULOSKELETAL:  No edema; No deformity  SKIN: Warm and dry NEUROLOGIC:  Alert and oriented x 3 PSYCHIATRIC:  Normal affect    Risk Assessment/Risk Calculators:   { Click to calculate CARPREG II - THEN refresh note :1}    { Click to caclulate Mod WHO Class of CV Risk - THEN refresh note :1}     { Click for [id number] Score - THEN Refresh Note    :[id number]}      ASSESSMENT & PLAN:    #Chronic HTN: #Hypertension in Pregnancy: #History of Pre-Eclampsia and HELLP Syndrome: Patient with long-standing  history of chronic hypertension with history of HELLP syndrome and pre-eclampsia in all 3 of her pregnancies. Continues to have elevated blood pressures today in the postpartum period. -Continue amlodipine '10mg'$  daily -Continue hydralazine '100mg'$  TID -Continue labetalol '400mg'$  BID -Will refer to HTN clinic for thorough secondary HTN work-up  .   There are no Patient Instructions on file for this visit.   Dispo:  No follow-ups on file.   Medication Adjustments/Labs and Tests Ordered: Current medicines are reviewed at length with the patient today.  Concerns regarding medicines are outlined above.  Tests Ordered: No orders of the defined types were placed in this encounter.  Medication Changes: No orders of the defined types were placed in this encounter.

## 2021-09-03 ENCOUNTER — Ambulatory Visit: Payer: Commercial Managed Care - HMO | Admitting: Cardiology

## 2021-09-03 ENCOUNTER — Telehealth: Payer: Self-pay | Admitting: Family Medicine

## 2021-09-03 NOTE — Telephone Encounter (Signed)
Called patient to schedule lab work, there was no answer to the phone call so a voicemail was left with the call back number for the office.

## 2021-09-03 NOTE — Progress Notes (Incomplete)
Cardio-Obstetrics Clinic  Follow Up Note   Date:  09/03/2021   ID:  ASTHA PROBASCO, DOB October 17, 1990, MRN [id number]  PCP:  Audley Hose, MD   [facility] Providers Cardiologist:  Freada Bergeron, MD  Electrophysiologist:  None      Referring MD: Audley Hose, MD   Chief Complaint: HTN  History of Present Illness:    Krystal [name] is a 31 y.o. female [G5P0322] who returns for follow up of HTN.  Patient with extensive history of HTN diagnosed in her teens. Reportedly had secondary work-up at [facility] which was unrevealing (records not available). Also with history of preeclampsia and HELLP syndrome with 2 prior pre-term births at 46w5dand 343w3dShe was followed throughout her third pregnancy for difficult to control HTN and persistent nausea and vomiting making it hard to to tolerate PO medications. She ultimately required hospitalization on 06/2021 for severe hypertension, anisocaria and HA. CT/MRI were negative for acute findings. She had a prolonged hospitalization complicated by persistent HTN, nausea/vomiting requiring NGT placement, severe anxiety/depression, and HELLP syndrome. She underwent emergent c-section with bilateral salpingectomy on 07/21/21. She was discharged POD #3.   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   Severe uncontrolled hypertension 10/08/2017   UTI (urinary tract infection)     Past Surgical History:  Procedure Laterality Date   APPENDECTOMY     BIOPSY  05/10/2020   Procedure: BIOPSY;  Surgeon: OuArta SilenceMD;   [facility];  Service: Endoscopy;;   CESAREAN SECTION N/A 12/06/2017   Procedure: CESAREAN SECTION;  Surgeon: DaSloan LeiterMD;  [facility] Service: [facility]st[facility]s;  Laterality: N/A;   CESAREAN SECTION  07/21/2021   Procedure: CESAREAN SECTION;  Surgeon: PrDonnamae JudeMD;  [facility];  Service: Obstetrics;;   CHOLECYSTECTOMY N/A 05/11/2020   Procedure: SINGLE SITE LAPAROSCOPIC CHOLECYSTECTOMY AND LIVER BIOSY;  Surgeon: GrMichael BostonMD;  [facility];  Service: General;  Laterality: N/A;   ESOPHAGOGASTRODUODENOSCOPY (EGD) WITH PROPOFOL N/A 05/10/2020   Procedure: ESOPHAGOGASTRODUODENOSCOPY (EGD) WITH PROPOFOL;  Surgeon: OuArta SilenceMD;  [facility];  Service: Endoscopy;  Laterality: N/A;   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     Allergies:   Red dye, Compazine [prochlorperazine maleate], Other, Vicodin [hydrocodone-acetaminophen], Amitriptyline hcl, Duloxetine hcl, Lisinopril, Reglan [metoclopramide], and Nifedipine   Social History   Socioeconomic History   Marital status: Married    Spouse name: Not on file   Number of children: Not on file   Years of education: Not on file   Highest education level: Not on file  Occupational History   Not on file  Tobacco Use   Smoking status: Never   Smokeless tobacco: Never   Tobacco comments:    She tried a cigarette and never smoked  again  Vaping Use   Vaping Use: Never used  Substance and Sexual Activity   Alcohol use: Not Currently    Comment: bottle of wine a week-occ   Drug use: No   Sexual activity: Not Currently  Other Topics Concern   Not on file  Social History Narrative   Not on file   Social Determinants of Health   Financial Resource Strain: Not on file  Food Insecurity: No Food Insecurity (06/13/2021)   Hunger Vital Sign    Worried About Running Out of Food in the Last Year: Never true    Ran Out of Food in the Last Year: Never true  Transportation Needs: Unmet Transportation Needs (06/13/2021)   PRAPARE - Hydrologist (Medical): Yes    Lack of Transportation (Non-Medical): Yes  Physical Activity: Not on file  Stress: Not on file  Social Connections: Not on file  { Click here to update SDOH then refresh :1}    Family History  Problem Relation Age of Onset   Hypertension Mother    Hypertension Father    { Click here to update FH then refresh note    :1}   ROS:   Please see the history of present illness.    *** All other systems reviewed and are negative.   Labs/EKG Reviewed:    EKG:   EKG is *** ordered today.  The ekg ordered today demonstrates ***  Recent Labs: 06/10/2021: Magnesium 2.0; NT-Pro BNP 165 06/27/2021: B Natriuretic Peptide 28.9 07/03/2021: TSH 2.154 08/14/2021: ALT 18; BUN 19; Creatinine, Ser 1.00; Hemoglobin 11.7; Platelets 326; Potassium 3.7; Sodium 138   Recent Lipid Panel No results found for: "CHOL", "TRIG", "HDL", "CHOLHDL", "LDLCALC", "LDLDIRECT"  Physical Exam:    VS:  There were no vitals taken for this visit.    Wt Readings from Last 3 Encounters:  08/29/21 234 lb (106.1 kg)  08/14/21 227 lb 8 oz (103.2 kg)  08/14/21 225 lb 11.2 oz (102.4 kg)     GEN: *** Well nourished, well developed in no acute distress HEENT: Normal NECK: No JVD; No carotid bruits LYMPHATICS: No lymphadenopathy CARDIAC: ***RRR, no murmurs, rubs,  gallops RESPIRATORY:  Clear to auscultation without rales, wheezing or rhonchi  ABDOMEN: Soft, non-tender, non-distended MUSCULOSKELETAL:  No edema; No deformity  SKIN: Warm and dry NEUROLOGIC:  Alert and oriented x 3 PSYCHIATRIC:  Normal affect    Risk Assessment/Risk Calculators:   { Click to calculate CARPREG II - THEN refresh note :1}    { Click to caclulate Mod WHO Class of CV Risk - THEN refresh note :1}     { Click for [id number] Score - THEN Refresh Note    :[id number]}      ASSESSMENT & PLAN:    #Chronic HTN: #Hypertension in Pregnancy: #History of Pre-Eclampsia and HELLP Syndrome: Patient with long-standing  history of chronic hypertension with history of HELLP syndrome and pre-eclampsia in all 3 of her pregnancies. Continues to have elevated blood pressures today in the postpartum period. -Continue amlodipine '10mg'$  daily -Continue hydralazine '100mg'$  TID -Continue labetalol '400mg'$  BID -Will refer to HTN clinic for thorough secondary HTN work-up  .   There are no Patient Instructions on file for this visit.   Dispo:  No follow-ups on file.   Medication Adjustments/Labs and Tests Ordered: Current medicines are reviewed at length with the patient today.  Concerns regarding medicines are outlined above.  Tests Ordered: No orders of the defined types were placed in this encounter.  Medication Changes: No orders of the defined types were placed in this encounter.    ***

## 2021-09-04 ENCOUNTER — Telehealth: Payer: Self-pay | Admitting: Family Medicine

## 2021-09-04 NOTE — Telephone Encounter (Signed)
Called patient to schedule urine lab work, there was no answer to the phone call so a voicemail was left with the call back number for the office.

## 2021-09-04 NOTE — BH Specialist Note (Signed)
Integrated Behavioral Health via Telemedicine Visit  09/11/2021 MATHEW STORCK [id number]  Number of [place] Clinician visits: 4- Fourth Visit  Session Start time: 6060   Session End time: 0459  Total time in minutes: 29   Referring Provider: Darron Doom, MD Krystal/Family location: Home [facility] Provider location: [facility] for [place] at [facility]  All persons participating in visit: Krystal [name] and Krystal [name]   Types of Service: Individual psychotherapy and Video visit  I connected with Krystal [name] and/or Krystal [name]'s  n/a  via  Telephone or Video Enabled Telemedicine Application  (Video is Caregility application) and verified that I am speaking with the correct person using two identifiers. Discussed confidentiality: Yes   I discussed the limitations of telemedicine and the availability of in person appointments.  Discussed there is a possibility of technology failure and discussed alternative modes of communication if that failure occurs.  I discussed that engaging in this telemedicine visit, they consent to the provision of behavioral healthcare and the services will be billed under their insurance.  Krystal and/or legal guardian expressed understanding and consented to Telemedicine visit: Yes   Presenting Concerns: Krystal and/or family reports the following symptoms/concerns: Managing family of origin response to loss of father, current family's adjustment to baby in NICU; difficult first period after childbirth. Duration of problem: Postpartum; Severity of problem: moderate  Krystal and/or Family's Strengths/Protective Factors: Social connections, Concrete supports in place (healthy food, safe environments, etc.), Sense of purpose, and Physical Health (exercise, healthy diet, medication compliance, etc.)  Goals Addressed: Krystal will:  Reduce symptoms of: anxiety, depression,  and stress   Increase knowledge and/or ability of: stress reduction   Progress towards Goals: Ongoing  Interventions: Interventions utilized:  Medication Monitoring and Supportive Reflection Standardized Assessments completed: Not Needed  Krystal and/or Family Response: Krystal agrees with treatment plan.   Assessment: Krystal currently experiencing Major depressive disorder, recurrent ; PTSD, Grief .   Krystal may benefit from continued therapeutic interventions.  Plan: Follow up with behavioral health clinician on : Two weeks Behavioral recommendations:  -Continue taking Effexor and Ativan as prescribed  -Continue allowing feelings of grief as they come -Continue plan to accept invitation to (much-deserved) birthday event with friends  -Continue with plan to bring daughters to meet baby sister in person on Friday -Consider using walk-in hours to establish with psychiatry at [facility]  Or call to schedule appointment with another psychiatrist that takes insurance Referral(s): [place] (In Clinic)  I discussed the assessment and treatment plan with the Krystal and/or parent/guardian. They were provided an opportunity to ask questions and all were answered. They agreed with the plan and demonstrated an understanding of the instructions.   They were advised to call back or seek an in-person evaluation if the symptoms worsen or if the condition fails to improve as anticipated.  Krystal [name] Krystal Pizzuto, LCSW

## 2021-09-05 ENCOUNTER — Other Ambulatory Visit: Payer: Commercial Managed Care - HMO

## 2021-09-05 ENCOUNTER — Other Ambulatory Visit: Payer: Self-pay

## 2021-09-05 ENCOUNTER — Telehealth: Payer: Self-pay | Admitting: Family Medicine

## 2021-09-05 DIAGNOSIS — R32 Unspecified urinary incontinence: Secondary | ICD-10-CM

## 2021-09-05 NOTE — Telephone Encounter (Signed)
Patient requesting her referral for UROLOGY be sent somewhere in [place]

## 2021-09-06 LAB — URINALYSIS, ROUTINE W REFLEX MICROSCOPIC
Bilirubin, UA: NEGATIVE
Glucose, UA: NEGATIVE
Ketones, UA: NEGATIVE
Leukocytes,UA: NEGATIVE
Nitrite, UA: NEGATIVE
Protein,UA: NEGATIVE
RBC, UA: NEGATIVE
Specific Gravity, UA: 1.016 (ref 1.005–1.030)
Urobilinogen, Ur: 0.2 mg/dL (ref 0.2–1.0)
pH, UA: 6.5 (ref 5.0–7.5)

## 2021-09-06 LAB — URINE CULTURE

## 2021-09-09 ENCOUNTER — Ambulatory Visit: Payer: Self-pay

## 2021-09-09 NOTE — Lactation Note (Signed)
This note was copied from a baby's chart.  NICU Lactation Consultation Note  Patient Name: Girl Mianna Tosh UUEKC'M Date: 09/09/2021 Age:33 wk.o.   Subjective Reason for consult: Follow-up assessment; Preterm <34wks  Lactation followed up with Ms. Fiebelkorn during brief visitation. I provided additional storage bottles and labels. She is able to visit less frequently due to some transportation challenges.  She has all needed supplies for breast pumping at this time. Her milk volume has improved, and she credits a change to eating more whole grains vs. processed carbohydrates. She also has a new supplement that she received; she has not begun taking it at this time. She showed me the ingredients, and it contains moringa, shatavari, and fennel, all of which are safe for breastfeeding.  Objective Infant data: Mother's Current Feeding Choice: Breast Milk and Formula  Maternal data: [id number]  C-Section, Low Transverse Current breast feeding challenges:: NICU; separation  Does the patient have breastfeeding experience prior to this delivery?: Yes  Pumping frequency: q3-4 hours Pumped volume: 90 mL (120 in the am and 60 in the pm)   WIC Program: Yes WIC Referral Sent?: Yes Pump:  (WIC - [facility])   Intervention/Plan Interventions: Breast feeding basics reviewed; Education (additional labels and storage bottles)  Tools: Pump  Plan: Consult Status: NICU follow-up  NICU Follow-up type: Weekly NICU follow up    Lenore Manner 09/09/2021, 12:16 PM

## 2021-09-11 ENCOUNTER — Ambulatory Visit: Payer: Commercial Managed Care - HMO | Admitting: Clinical

## 2021-09-11 DIAGNOSIS — F4321 Adjustment disorder with depressed mood: Secondary | ICD-10-CM

## 2021-09-11 DIAGNOSIS — F330 Major depressive disorder, recurrent, mild: Secondary | ICD-10-CM

## 2021-09-11 DIAGNOSIS — F4310 Post-traumatic stress disorder, unspecified: Secondary | ICD-10-CM

## 2021-09-11 NOTE — Patient Instructions (Signed)
[facility] for [facility] at [facility] [place], [place] [address] [phone] (main office) [phone] [facility] office)  [facility]  [address] [place], [place] [address] [phone] or [phone] [facility] 24/7 FOR ANYONE [address], [place], [place] Fax: [phone] [email or web address] *Interpreters available *Accepts all insurance and uninsured for Urgent Care needs *Accepts Medicaid and uninsured for outpatient treatment     ONLY FOR [facility]  New patient assessment and therapy walk-ins Mondays and Wednesdays 8am-11am First and second Fridays 1pm-5pm  New patient psychiatry and medication management walk-ins:  Mondays, Wednesdays, Thursdays, Fridays 8am -11am NO PSYCHIATRY WALK-INS on TUESDAYS

## 2021-09-16 ENCOUNTER — Telehealth: Payer: Self-pay

## 2021-09-16 ENCOUNTER — Encounter: Payer: Self-pay | Admitting: Physical Therapy

## 2021-09-16 ENCOUNTER — Other Ambulatory Visit: Payer: Self-pay

## 2021-09-16 ENCOUNTER — Ambulatory Visit: Payer: Commercial Managed Care - HMO | Attending: Family Medicine | Admitting: Physical Therapy

## 2021-09-16 DIAGNOSIS — R293 Abnormal posture: Secondary | ICD-10-CM | POA: Diagnosis not present

## 2021-09-16 DIAGNOSIS — M6281 Muscle weakness (generalized): Secondary | ICD-10-CM | POA: Insufficient documentation

## 2021-09-16 DIAGNOSIS — R279 Unspecified lack of coordination: Secondary | ICD-10-CM | POA: Insufficient documentation

## 2021-09-16 DIAGNOSIS — R32 Unspecified urinary incontinence: Secondary | ICD-10-CM | POA: Insufficient documentation

## 2021-09-16 DIAGNOSIS — M62838 Other muscle spasm: Secondary | ICD-10-CM | POA: Diagnosis not present

## 2021-09-16 NOTE — BH Specialist Note (Unsigned)
Integrated Behavioral Health via Telemedicine Visit  09/26/2021 Krystal [name] [id number]  Number of Integrated Behavioral Health Clinician visits: 5-Fifth Visit  Session Start time: 0818   Session End time: 0848  Total time in minutes: 30   Referring Provider: Darron Doom, MD Patient/Family location: Home [facility] Provider location: [facility] for [place] at [facility]  All persons participating in visit: [name] and [place]   Types of Service: Individual psychotherapy and Video visit  I connected with Krystal [name] and/or Krystal [name]'s  n/a  via  Telephone or Video Enabled Telemedicine Application  (Video is Caregility application) and verified that I am speaking with the correct person using two identifiers. Discussed confidentiality: Yes   I discussed the limitations of telemedicine and the availability of in person appointments.  Discussed there is a possibility of technology failure and discussed alternative modes of communication if that failure occurs.  I discussed that engaging in this telemedicine visit, they consent to the provision of behavioral healthcare and the services will be billed under their insurance.  Patient and/or legal guardian expressed understanding and consented to Telemedicine visit: Yes   Presenting Concerns: Patient and/or family reports the following symptoms/concerns: Call regarding baby with infection in NICU, along with potty training issues with older girls; unable to see baby for about a week with sinus infection.  Duration of problem: Postpartum increase; Severity of problem: moderate  Patient and/or Family's Strengths/Protective Factors: Social connections, Concrete supports in place (healthy food, safe environments, etc.), Sense of purpose, and Physical Health (exercise, healthy diet, medication compliance, etc.)  Goals Addressed: Patient will:  Reduce symptoms of:  anxiety, depression, and stress   Demonstrate ability to: Increase healthy adjustment to current life circumstances  Progress towards Goals: Ongoing  Interventions: Interventions utilized:  Supportive Reflection Standardized Assessments completed: Not Needed  Patient and/or Family Response: Patient agrees with treatment plan.   Assessment: Patient currently experiencing Major depressive disorder, recurrent, mild; Hx of PTSD.   Patient may benefit from psychoeducation and brief therapeutic interventions regarding coping with symptoms of depression, anxiety, escalated by life stress .  Plan: Follow up with behavioral health clinician on : Three weeks Behavioral recommendations:  -Continue accepting referral to psychiatry; consider contacting insurance for additional options for psychiatry as needed -Continue taking BH medication as prescribed; continue using self-coping strategies daily Referral(s): [place] (In Clinic)  I discussed the assessment and treatment plan with the patient and/or parent/guardian. They were provided an opportunity to ask questions and all were answered. They agreed with the plan and demonstrated an understanding of the instructions.   They were advised to call back or seek an in-person evaluation if the symptoms worsen or if the condition fails to improve as anticipated.  Caroleen Hamman Krystal Bayer, LCSW
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
## 2021-09-16 NOTE — Telephone Encounter (Signed)
Patient request referral to urology for a follow up pelvic pain. Patient stated she is also having lactating issues.

## 2021-09-17 NOTE — Telephone Encounter (Signed)
Called patient stating I am returning her phone call. Discussed we'll send records to [facility] for referral and provided their contact information. Patient verbalized understanding and asked if there was a prescription to help with let down. Patient reports difficulty with let down since delivery. Patient is only pumping as baby is still in the NICU. She reports getting 3-4 ounces out in the morning but only 1-2 ounces with remaining pump sessions throughout the day. She states she does hand expression and massage throughout the pumping session and feels relaxed. States she spends at least an hour and nothing is helping. Discussed with patient there isn't a prescription to help with that and recommended following up with LCs in NICU. Patient verbalized understanding.

## 2021-09-17 NOTE — Progress Notes (Unsigned)
Cardio-Obstetrics Clinic  Follow Up Note   Date:  09/19/2021   ID:  Krystal [name], DOB 08/01/1990, MRN [id number]  PCP:  Bakare, Mobolaji B, MD   [facility] Providers Cardiologist:  Contrell Ballentine E Luda Charbonneau, MD  Electrophysiologist:  None      Referring MD: Bakare, Mobolaji B, MD   Chief Complaint: HTN  History of Present Illness:    Krystal [name] is a 31 y.o. female [G5P0322] who returns for follow up of HTN.  Patient with extensive history of HTN diagnosed in her teens. Reportedly had secondary work-up at [name] which was unrevealing (records not available). Also with history of preeclampsia and HELLP syndrome with 2 prior pre-term births at [redacted]w[redacted]d and [redacted]w[redacted]d. She was followed throughout her third pregnancy for difficult to control HTN and persistent nausea and vomiting making it hard to to tolerate PO medications. She ultimately required hospitalization on 06/2021 for severe hypertension, anisocaria and HA. CT/MRI were negative for acute findings. She had a prolonged hospitalization complicated by persistent HTN, nausea/vomiting requiring NGT placement, severe anxiety/depression, and HELLP syndrome. She underwent emergent c-section with bilateral salpingectomy on 07/21/21. She was discharged POD #3.   Was last seen in clinic 08/2021 where her blood pressures remained very elevated. We continued on amlodipine 10mg daily, hydralazine 100mg TID, started spironolactone 25mg and changed labetalol to coreg 25mg BID. She was referred to Dr. Rockledge but did not show up. Also planned to see Pharm D but missed appointments.  Today, ***   Prior CV Studies Reviewed: The following studies were reviewed today: TTE 06/17/21: IMPRESSIONS   1. Global longitudinal strain is -20%. Left ventricular ejection  fraction, by estimation, is 60 to 65%. The left ventricle has normal  function. The left ventricle has no regional wall motion abnormalities.  There is mild left ventricular  hypertrophy.  Left ventricular diastolic parameters were normal.   2. Right ventricular systolic function is normal. The right ventricular  size is normal.   3. Mild mitral valve regurgitation.   4. The aortic valve is tricuspid. Aortic valve regurgitation is not  visualized. Aortic valve sclerosis is present, with no evidence of aortic  valve stenosis.   5. The inferior vena cava is normal in size with greater than 50%  respiratory variability, suggesting right atrial pressure of 3 mmHg.   Past Medical History:  Diagnosis Date   Acute acalculous cholecystitis s/p lap cholecystectomy 05/11/2020 05/11/2020   Angio-edema 01/15/2021   Anxiety    Asthma    HELLP (hemolytic anemia/elev liver enzymes/low platelets in pregnancy) 12/08/2017   IC (interstitial cystitis)    Kidney stones    Migraines    Necrotizing fasciitis (HCC)    left hand amputation   Severe uncontrolled hypertension 10/08/2017   UTI (urinary tract infection)     Past Surgical History:  Procedure Laterality Date   APPENDECTOMY     BIOPSY  05/10/2020   Procedure: BIOPSY;  Surgeon: Outlaw, William, MD;  Location: [facility];  Service: Endoscopy;;   CESAREAN SECTION N/A 12/06/2017   Procedure: CESAREAN SECTION;  Surgeon: Davis, Kelly M, MD;  [facility]: [facility];  Service: Obstetrics;  Laterality: N/A;   CESAREAN SECTION  07/21/2021   Procedure: CESAREAN SECTION;  Surgeon: Pratt, Tanya S, MD;  Location: [facility];  Service: Obstetrics;;   CHOLECYSTECTOMY N/A 05/11/2020   Procedure: SINGLE SITE LAPAROSCOPIC CHOLECYSTECTOMY AND LIVER BIOSY;  Surgeon: Gross, Steven, MD;  Location: [facility];  Service: General;  Laterality: N/A;   ESOPHAGOGASTRODUODENOSCOPY (EGD)   WITH PROPOFOL N/A 05/10/2020   Procedure: ESOPHAGOGASTRODUODENOSCOPY (EGD) WITH PROPOFOL;  Surgeon: Outlaw, William, MD;  Location: [facility];  Service: Endoscopy;  Laterality: N/A;   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   albuterol (PROVENTIL HFA;VENTOLIN HFA) 108 (90 Base) MCG/ACT inhaler Inhale 2 puffs into the lungs every 6 (six) hours as needed for wheezing or shortness of breath.   amLODipine (NORVASC) 10 MG tablet Take 1 tablet (10 mg total) by mouth daily.   budesonide-formoterol (SYMBICORT) 80-4.5 MCG/ACT inhaler Inhale 2 puffs into the lungs in the morning and at bedtime. Take for 1-2 weeks at a time during asthma flare. Rinse mouth after each use.   carvedilol (COREG) 25 MG tablet Take 1 tablet (25 mg total) by mouth 2 (two) times daily.   cetirizine (ZYRTEC ALLERGY) 10 MG tablet Take 1 tablet (10 mg total) by mouth 2 (two) times daily.   EPINEPHrine 0.3 mg/0.3 mL IJ SOAJ injection Inject 0.3 mg into the muscle as needed for anaphylaxis.   famotidine (PEPCID) 20 MG tablet Take 20 mg by mouth 2 (two) times daily.   hydrALAZINE (APRESOLINE) 100 MG tablet Take 1 tablet (100 mg total) by mouth 3 (three) times daily.   ibuprofen (ADVIL) 600 MG tablet Take 1 tablet (600 mg total) by mouth every 6 (six) hours as needed for fever, headache or moderate pain.   levothyroxine (SYNTHROID) 125 MCG tablet Take 1 tablet (125 mcg total) by mouth daily before breakfast.   LORazepam (ATIVAN) 1 MG tablet Take 2 tablets (2 mg total) by mouth every 8 (eight) hours as needed for anxiety.   pantoprazole (PROTONIX) 40 MG tablet Take 1 tablet (40 mg total) by mouth daily.   Prenatal Multivit-Min-Fe-FA (PRE-NATAL PO) Take 1 tablet by mouth daily.   spironolactone (ALDACTONE) 25 MG tablet Take 1 tablet  (25 mg total) by mouth daily.   venlafaxine XR (EFFEXOR-XR) 75 MG 24 hr capsule Take 3 capsules (225 mg total) by mouth daily with breakfast.   [DISCONTINUED] labetalol (NORMODYNE) 200 MG tablet Take 2 tablets (400 mg total) by mouth 2 (two) times daily. Take two tablets at night and 1.5 tablets in the morning     Allergies:   Red dye, Compazine [prochlorperazine maleate], Other, Vicodin [hydrocodone-acetaminophen], Amitriptyline hcl, Duloxetine hcl, Lisinopril, Reglan [metoclopramide], and Nifedipine   Social History   Socioeconomic History   Marital status: Married    Spouse name: Not on file   Number of children: Not on file   Years of education: Not on file   Highest education level: Not on file  Occupational History   Not on file  Tobacco Use   Smoking status: Never   Smokeless tobacco: Never   Tobacco   comments:    She tried a cigarette and never smoked again  Vaping Use   Vaping Use: Never used  Substance and Sexual Activity   Alcohol use: Not Currently    Comment: bottle of wine a week-occ   Drug use: No   Sexual activity: Not Currently  Other Topics Concern   Not on file  Social History Narrative   Not on file   Social Determinants of Health   Financial Resource Strain: Not on file  Food Insecurity: No Food Insecurity (06/13/2021)   Hunger Vital Sign    Worried About Running Out of Food in the Last Year: Never true    Ran Out of Food in the Last Year: Never true  Transportation Needs: Unmet Transportation Needs (06/13/2021)   PRAPARE - Transportation    Lack of Transportation (Medical): Yes    Lack of Transportation (Non-Medical): Yes  Physical Activity: Not on file  Stress: Not on file  Social Connections: Not on file  {    Family History  Problem Relation Age of Onset   Hypertension Mother    Hypertension Father    {  ROS:   Please see the history of present illness.    All other systems reviewed and are negative.   Labs/EKG Reviewed:    EKG:    EKG is not ordered today.    Recent Labs: 06/10/2021: Magnesium 2.0; NT-Pro BNP 165 06/27/2021: B Natriuretic Peptide 28.9 07/03/2021: TSH 2.154 08/14/2021: ALT 18; BUN 19; Creatinine, Ser 1.00; Hemoglobin 11.7; Platelets 326; Potassium 3.7; Sodium 138   Recent Lipid Panel No results found for: "CHOL", "TRIG", "HDL", "CHOLHDL", "LDLCALC", "LDLDIRECT"  Physical Exam:    VS:  BP (!) 160/100 (BP Location: Right Arm, Patient Position: Sitting, Cuff Size: Normal)   Pulse 77   Ht 5' 11" (1.803 m)   Wt 234 lb 9.6 oz (106.4 kg)   SpO2 99%   BMI 32.72 kg/m     Wt Readings from Last 3 Encounters:  09/19/21 234 lb 9.6 oz (106.4 kg)  08/29/21 234 lb (106.1 kg)  08/14/21 227 lb 8 oz (103.2 kg)     GEN:  Well nourished, well developed in no acute distress HEENT: Normal NECK: No JVD; No carotid bruits CARDIAC: RRR, 2/6 systolic murmur RESPIRATORY:  Clear to auscultation without rales, wheezing or rhonchi  ABDOMEN: Soft, non-tender, non-distended MUSCULOSKELETAL:  No edema; No deformity  SKIN: Warm and dry NEUROLOGIC:  Alert and oriented x 3 PSYCHIATRIC:  Normal affect    Risk Assessment/Risk Calculators:     ASSESSMENT & PLAN:    #Chronic, Resistant HTN: #Hypertension in Pregnancy: #History of Pre-Eclampsia and HELLP Syndrome: Patient with long-standing history of chronic hypertension with history of HELLP syndrome and pre-eclampsia in all 3 of her pregnancies. Continues to have severely elevated blood pressures today in the postpartum period, but tolerating PO much better. She is currently breast feeding. Now that she is not pregnant, we have more antihypertensive options. Will also refer to Dr. Morrison for work-up for secondary causes of HTN. -Refer to Dr. St. Clair for further work-up and management of HTN (diagnosed in her teens) -Continue amlodipine 10mg daily -Continue hydralazine 100mg TID -Continue coreg 25mg BID due to significant nausea with labetalol limiting dose  titration (has previously tolerated coreg while breast feeding) -Continue spironolactone 25mg daily -BMET next week -Can add HCTZ if BP remains elevated -Will see her back in 4 weeks  .   Patient Instructions  Medication Instructions:  Your physician has   recommended you make the following change in your medication:  1) STOP taking labetalol  2) START taking Coreg (carvedilol) 25 mg twice daily  3) START taking spironolactone 25 mg daily   *If you need a refill on your cardiac medications before your next appointment, please call your pharmacy*  Lab Work: BMET in one week (8/3 between 7:15 am and 5:00 pm) If you have labs (blood work) drawn today and your tests are completely normal, you will receive your results only by: MyChart Message (if you have MyChart) OR A paper copy in the mail If you have any lab test that is abnormal or we need to change your treatment, we will call you to review the results.  Follow-Up: At [facility], you and your health needs are our priority.  As part of our continuing mission to provide you with exceptional heart care, we have created designated Provider Care Teams.  These Care Teams include your primary Cardiologist (physician) and Advanced Practice Providers (APPs -  Physician Assistants and Nurse Practitioners) who all work together to provide you with the care you need, when you need it.  Your next appointment:   4 week(s)  The format for your next appointment:   In Person  Provider:   Kent Braunschweig E Kayleen Alig, MD    Other Instructions You have been referred to see Dr. [place] in the Hypertension Clinic  Important Information About Sugar         Dispo:  No follow-ups on file.   Medication Adjustments/Labs and Tests Ordered: Current medicines are reviewed at length with the patient today.  Concerns regarding medicines are outlined above.  Tests Ordered: Orders Placed This Encounter  Procedures   Basic metabolic panel   AMB  REFERRAL TO ADVANCED HTN CLINIC   Medication Changes: Meds ordered this encounter  Medications   carvedilol (COREG) 25 MG tablet    Sig: Take 1 tablet (25 mg total) by mouth 2 (two) times daily.    Dispense:  180 tablet    Refill:  3   spironolactone (ALDACTONE) 25 MG tablet    Sig: Take 1 tablet (25 mg total) by mouth daily.    Dispense:  90 tablet    Refill:  3    

## 2021-09-19 ENCOUNTER — Encounter: Payer: Self-pay | Admitting: Cardiology

## 2021-09-19 ENCOUNTER — Ambulatory Visit (INDEPENDENT_AMBULATORY_CARE_PROVIDER_SITE_OTHER): Payer: Commercial Managed Care - HMO | Admitting: Cardiology

## 2021-09-19 VITALS — BP 160/100 | HR 77 | Ht 71.0 in | Wt 234.6 lb

## 2021-09-19 DIAGNOSIS — Z8759 Personal history of other complications of pregnancy, childbirth and the puerperium: Secondary | ICD-10-CM | POA: Diagnosis not present

## 2021-09-19 DIAGNOSIS — Z8751 Personal history of pre-term labor: Secondary | ICD-10-CM

## 2021-09-19 DIAGNOSIS — I10 Essential (primary) hypertension: Secondary | ICD-10-CM

## 2021-09-19 MED ORDER — CARVEDILOL 25 MG PO TABS: 25.0000 mg | ORAL_TABLET | Freq: Two times a day (BID) | ORAL | 3 refills | Status: DC

## 2021-09-19 MED ORDER — SPIRONOLACTONE 25 MG PO TABS: 25.0000 mg | ORAL_TABLET | Freq: Every day | ORAL | 3 refills | Status: DC

## 2021-09-19 NOTE — Patient Instructions (Addendum)
Medication Instructions:  Your physician has recommended you make the following change in your medication:  1) STOP taking labetalol  2) START taking Coreg (carvedilol) 25 mg twice daily  3) START taking spironolactone 25 mg daily   *If you need a refill on your cardiac medications before your next appointment, please call your pharmacy*  Lab Work: BMET in one week (8/3 between 7:15 am and 5:00 pm) If you have labs (blood work) drawn today and your tests are completely normal, you will receive your results only by: [place] (if you have MyChart) OR A paper copy in the mail If you have any lab test that is abnormal or we need to change your treatment, we will call you to review the results.  Follow-Up: At [facility], you and your health needs are our priority.  As part of our continuing mission to provide you with exceptional heart care, we have created designated Provider Care Teams.  These Care Teams include your primary Cardiologist (physician) and Advanced Practice Providers (APPs -  Physician Assistants and Nurse Practitioners) who all work together to provide you with the care you need, when you need it.  Your next appointment:   4 week(s)  The format for your next appointment:   In Person  Provider:   Freada Bergeron, MD    Other Instructions You have been referred to see Dr. Oval Linsey in the Hypertension [place]

## 2021-09-24 ENCOUNTER — Ambulatory Visit: Payer: Medicaid Other | Admitting: Physical Therapy

## 2021-09-26 ENCOUNTER — Other Ambulatory Visit: Payer: Commercial Managed Care - HMO

## 2021-09-26 ENCOUNTER — Ambulatory Visit (INDEPENDENT_AMBULATORY_CARE_PROVIDER_SITE_OTHER): Payer: Commercial Managed Care - HMO | Admitting: Clinical

## 2021-09-26 DIAGNOSIS — F330 Major depressive disorder, recurrent, mild: Secondary | ICD-10-CM

## 2021-09-26 DIAGNOSIS — F4310 Post-traumatic stress disorder, unspecified: Secondary | ICD-10-CM

## 2021-09-26 DIAGNOSIS — I10 Essential (primary) hypertension: Secondary | ICD-10-CM

## 2021-09-26 LAB — BASIC METABOLIC PANEL
BUN/Creatinine Ratio: 24 — ABNORMAL HIGH (ref 9–23)
BUN: 21 mg/dL — ABNORMAL HIGH (ref 6–20)
CO2: 25 mmol/L (ref 20–29)
Calcium: 10.2 mg/dL (ref 8.7–10.2)
Chloride: 101 mmol/L (ref 96–106)
Creatinine, Ser: 0.87 mg/dL (ref 0.57–1.00)
Glucose: 76 mg/dL (ref 70–99)
Potassium: 4.7 mmol/L (ref 3.5–5.2)
Sodium: 139 mmol/L (ref 134–144)
eGFR: 90 mL/min/{1.73_m2} (ref >59)

## 2021-09-30 ENCOUNTER — Ambulatory Visit (INDEPENDENT_AMBULATORY_CARE_PROVIDER_SITE_OTHER): Payer: Commercial Managed Care - HMO | Admitting: Psychiatry

## 2021-09-30 ENCOUNTER — Ambulatory Visit: Payer: Managed Care, Other (non HMO) | Admitting: Allergy

## 2021-09-30 VITALS — BP 175/128 | HR 75 | Ht 71.0 in | Wt 231.4 lb

## 2021-09-30 DIAGNOSIS — M542 Cervicalgia: Secondary | ICD-10-CM | POA: Diagnosis not present

## 2021-09-30 NOTE — Progress Notes (Signed)
Referring:  Genia Del, MD [place],  [place] [id number]  PCP: Audley Hose, MD  Neurology was asked to evaluate Krystal [name], a 31 year old female for a chief complaint of headaches.  Our recommendations of care will be communicated by shared medical record.    CC:  headaches  History provided from self  HPI:  Medical co-morbidities: poorly controlled HTN, kidney stone, HELLP syndrome, asthma, hypothyroidism  The patient presents for evaluation of headaches. She has had headaches for most of her life, but they worsened after her most recent pregnancy. She currently has 4 headaches per week. Headaches start in her neck and radiate up the back of her head. They are associated with photophobia and phonophobia. Can last up to 24 hours at a time. Takes Tylenol and ibuprofen for rescue which are only slightly effective.  She recently underwent emergency C-section on 07/21/21 for gestational HTN and HELLP syndrome. She continues to have poorly controlled HTN on amlodipine, hydralazine, coreg, and spironolactone.  MRI/MRA/MRV on 07/02/21 were unremarkable.  Headache History: Onset: several years ago Triggers: no Aura: blurred vision Location: retro-orbital, neck Quality/Description: throbbing Associated Symptoms:  Photophobia: yes  Phonophobia: yes  Nausea: sometimes Worse with activity?: yes Duration of headaches: 2-24 hours  Headache days per month: 12 Headache free days per month: 18  Current Treatment: Abortive Tylenol ibuprofen  Preventative none  Prior Therapies                                 Effexor 75 mg PRN Cymbalta Carvedilol Labetalol Gabapentin - weight gain Lisinopril - cough Reglan Imitrex - stopped working Occipital nerve block - lack of efficacy  LABS: CBC    Component Value Date/Time   WBC 8.3 08/14/2021 1430   WBC 7.7 08/14/2021 1146   RBC 3.45 (L) 08/14/2021 1430   RBC 3.55 (L) 08/14/2021 1146   HGB 11.7 (L)  08/14/2021 1430   HGB 11.6 08/14/2021 1146   HCT 35.0 (L) 08/14/2021 1430   HCT 35.2 08/14/2021 1146   PLT 326 08/14/2021 1430   PLT 314 08/14/2021 1146   MCV 101.4 (H) 08/14/2021 1430   MCV 99 (H) 08/14/2021 1146   MCH 33.9 08/14/2021 1430   MCH 32.7 08/14/2021 1146   MCHC 33.4 08/14/2021 1430   MCHC 33.0 08/14/2021 1146   RDW 14.1 08/14/2021 1430   RDW 12.9 08/14/2021 1146   LYMPHSABS 2.4 08/10/2021 1354   LYMPHSABS 1.9 04/01/2021 1048   MONOABS 0.8 08/10/2021 1354   EOSABS 0.0 08/10/2021 1354   EOSABS 0.0 04/01/2021 1048   BASOSABS 0.1 08/10/2021 1354   BASOSABS 0.1 04/01/2021 1048      Latest Ref Rng & Units 09/26/2021   12:29 PM 08/14/2021    2:30 PM 08/14/2021   11:46 AM  CMP  Glucose 70 - 99 mg/dL 76  87  78   BUN 6 - 20 mg/dL '21  19  19   '$ Creatinine 0.57 - 1.00 mg/dL 0.87  1.00  0.97   Sodium 134 - 144 mmol/L 139  138  141   Potassium 3.5 - 5.2 mmol/L 4.7  3.7  4.4   Chloride 96 - 106 mmol/L 101  102  102   CO2 20 - 29 mmol/L '25  27  24   '$ Calcium 8.7 - 10.2 mg/dL 10.2  9.4  9.6   Total Protein 6.5 - 8.1 g/dL  5.7  6.1   Total Bilirubin 0.3 - 1.2 mg/dL  0.4  <0.2   Alkaline Phos 38 - 126 U/L  49  67   AST 15 - 41 U/L  19  14   ALT 0 - 44 U/L  18  17      IMAGING:  MRI, MRV, MRA brain 07/02/21 unremarkable  Imaging independently reviewed on September 30, 2021   Current Outpatient Medications on File Prior to Visit  Medication Sig Dispense Refill   acetaminophen (TYLENOL) 500 MG tablet Take 2 tablets (1,000 mg total) by mouth every 6 (six) hours as needed. 30 tablet 0   albuterol (PROVENTIL HFA;VENTOLIN HFA) 108 (90 Base) MCG/ACT inhaler Inhale 2 puffs into the lungs every 6 (six) hours as needed for wheezing or shortness of breath.     amLODipine (NORVASC) 10 MG tablet Take 1 tablet (10 mg total) by mouth daily. 90 tablet 2   budesonide-formoterol (SYMBICORT) 80-4.5 MCG/ACT inhaler Inhale 2 puffs into the lungs in the morning and at bedtime. Take for 1-2 weeks at a  time during asthma flare. Rinse mouth after each use. 1 each 3   carvedilol (COREG) 25 MG tablet Take 1 tablet (25 mg total) by mouth 2 (two) times daily. 180 tablet 3   cetirizine (ZYRTEC ALLERGY) 10 MG tablet Take 1 tablet (10 mg total) by mouth 2 (two) times daily. 60 tablet 5   EPINEPHrine 0.3 mg/0.3 mL IJ SOAJ injection Inject 0.3 mg into the muscle as needed for anaphylaxis. 1 each 2   famotidine (PEPCID) 20 MG tablet Take 20 mg by mouth 2 (two) times daily.     hydrALAZINE (APRESOLINE) 100 MG tablet Take 1 tablet (100 mg total) by mouth 3 (three) times daily. 90 tablet 1   ibuprofen (ADVIL) 600 MG tablet Take 1 tablet (600 mg total) by mouth every 6 (six) hours as needed for fever, headache or moderate pain. 40 tablet 0   levothyroxine (SYNTHROID) 125 MCG tablet Take 1 tablet (125 mcg total) by mouth daily before breakfast. 45 tablet 3   LORazepam (ATIVAN) 1 MG tablet Take 2 tablets (2 mg total) by mouth every 8 (eight) hours as needed for anxiety. 24 tablet 0   Prenatal Multivit-Min-Fe-FA (PRE-NATAL PO) Take 1 tablet by mouth daily.     spironolactone (ALDACTONE) 25 MG tablet Take 1 tablet (25 mg total) by mouth daily. 90 tablet 3   venlafaxine XR (EFFEXOR-XR) 75 MG 24 hr capsule Take 3 capsules (225 mg total) by mouth daily with breakfast. 180 capsule 0   pantoprazole (PROTONIX) 40 MG tablet Take 1 tablet (40 mg total) by mouth daily. (Patient not taking: Reported on 09/30/2021) 30 tablet 4   No current facility-administered medications on file prior to visit.     Allergies: Allergies  Allergen Reactions   Red Dye Other (See Comments)    ALL CAUSE PROBLEMS #40 IS WORST-MIGRAINES    Compazine [Prochlorperazine Maleate] Other (See Comments)    TWITCHING, CANT STAY STILL. Pt states she can tolerate promethazine   Other Other (See Comments)    FANSIDAR FOR MALARIA-ASTHMA   Vicodin [Hydrocodone-Acetaminophen] Itching, Nausea And Vomiting and Other (See Comments)    Pt states she can  tolerate acetaminophen   Amitriptyline Hcl Other (See Comments)    Angry moods   Duloxetine Hcl Other (See Comments)    Passing out, trimble, irritable   Lisinopril Cough   Reglan [Metoclopramide] Other (See Comments)    restless   Nifedipine Nausea And Vomiting  and Other (See Comments)    Discussed with patient and maternal fetal medicine, patient has intolerance but not frank allergy.    Family History: Migraine or other headaches in the family:  brother Aneurysms in a first degree relative:  no Brain tumors in the family:  no Other neurological illness in the family:   father had strokes  Past Medical History: Past Medical History:  Diagnosis Date   Acute acalculous cholecystitis s/p lap cholecystectomy 05/11/2020 05/11/2020   Angio-edema 01/15/2021   Anxiety    Asthma    HELLP (hemolytic anemia/elev liver enzymes/low platelets in pregnancy) 12/08/2017   IC (interstitial cystitis)    Kidney stones    Migraines    Necrotizing fasciitis ([place])    left hand amputation   Severe uncontrolled hypertension 10/08/2017   UTI (urinary tract infection)     Past Surgical History Past Surgical History:  Procedure Laterality Date   APPENDECTOMY     BIOPSY  05/10/2020   Procedure: BIOPSY;  Surgeon: Arta Silence, MD;  Location: [facility];  Service: Endoscopy;;   CESAREAN SECTION N/A 12/06/2017   Procedure: CESAREAN SECTION;  Surgeon: Sloan Leiter, MD;  Location: [facility];  Service: Obstetrics;  Laterality: N/A;   CESAREAN SECTION  07/21/2021   Procedure: CESAREAN SECTION;  Surgeon: Donnamae Jude, MD;  Location: [facility];  Service: Obstetrics;;   CHOLECYSTECTOMY N/A 05/11/2020   Procedure: SINGLE SITE LAPAROSCOPIC CHOLECYSTECTOMY AND LIVER BIOSY;  Surgeon: Michael Boston, MD;  Location[facility];  Service: General;  Laterality: N/A;   ESOPHAGOGASTRODUODENOSCOPY (EGD) WITH PROPOFOL N/A 05/10/2020   Procedure: ESOPHAGOGASTRODUODENOSCOPY (EGD) WITH PROPOFOL;  Surgeon:  Arta Silence, MD;  Location: [facility];  Service: Endoscopy;  Laterality: N/A;   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 Positive for headaches. All other systems reviewed and negative unless stated otherwise in HPI.   Physical Exam:   Vital Signs: BP (!) 175/128   Pulse 75   Ht '5\' 11"'$  (1.803 m)   Wt 231 lb 6 oz (105 kg)   BMI 32.27 kg/m  GENERAL: well appearing,in no acute distress,alert SKIN:  Color, texture, turgor normal. No rashes or lesions HEAD:  Normocephalic/atraumatic. CV:  RRR RESP: Normal respiratory effort MSK: +tenderness to palpation over right occiput, neck, and shoulders  NEUROLOGICAL: Mental Status: Alert, oriented to person, place and time,Follows commands Cranial Nerves: PERRL, visual fields intact to confrontation, extraocular movements intact, facial sensation intact, no facial droop or ptosis, hearing grossly intact, no dysarthria Motor: muscle strength 5/5 both upper and lower extremities, LUE amputated below elbow Reflexes: 2+ throughout Sensation: intact to light touch all 4 extremities Coordination: Finger-to- nose-finger intact on right,Heel-to-shin intact bilaterally Gait: normal-based   IMPRESSION: 31 year old female with a history of poorly controlled HTN, kidney stone, HELLP syndrome, asthma, hypothyroidism who presents for evaluation of migraines. She is currently breastfeeding. Will start daily magnesium and riboflavin for  migraine prevention. Referral to neck PT placed to help with her  cervicalgia. Would avoid triptans at this time as her blood pressure remains poorly controlled.  PLAN: -Prevention: Start daily magnesium and riboflavin -Rescue: ibuprofen, Tylenol -Referral to neck PT for cervicalgia  I spent a total of 30 minutes chart reviewing and counseling the patient. Headache education was done. Discussed treatment options including preventive and acute medications, natural supplements, and physical therapy. Discussed medication side effects, adverse reactions and drug interactions. Written educational materials and patient instructions outlining all of the above were given.  Follow-up: 7 months   Genia Harold, MD 09/30/2021   12:11 PM

## 2021-09-30 NOTE — Patient Instructions (Addendum)
Start Magnesium 400 mg daily and Riboflavin 200 mg twice a day for headache prevention Referral to physical therapy for the neck  Migraine and Breastfeeding:  Data is limiting regarding the safety of migraine medications during breastfeeding. These are some of the medications which appear to be safe during lactation.  Rescue Medications  -Ibuprofen is the preferred NSAID to use while breastfeeding -Tylenol -Eletriptan. Exposure to infant can be minimized by discarding breast milk for 24 hours after dose. -Sumatriptan. Exposure to infant can be minimized by discarding breast milk for 12 hours after dose.  Preventive Medications -Magnesium 400 mg daily -Riboflavin 200 mg twice a day -Verapamil -Gabapentin - monitor for drowsiness -Propranolol, labetalol - monitor for low heart rate, low blood sugar  Non-medication options: -Physical therapy for the neck -Occipital nerve blocks -Acupuncture -Biofeedback/Relaxation techniques

## 2021-10-01 ENCOUNTER — Ambulatory Visit: Payer: Self-pay

## 2021-10-01 DIAGNOSIS — F4310 Post-traumatic stress disorder, unspecified: Secondary | ICD-10-CM

## 2021-10-01 DIAGNOSIS — F4321 Adjustment disorder with depressed mood: Secondary | ICD-10-CM

## 2021-10-01 DIAGNOSIS — F330 Major depressive disorder, recurrent, mild: Secondary | ICD-10-CM

## 2021-10-01 NOTE — Lactation Note (Signed)
This note was copied from a baby's chart.  NICU Lactation Consultation Note  Patient Name: Krystal [name] Today's Date: 10/01/2021 Age:33 m.o.  Subjective Reason for consult: Follow-up assessment; NICU baby; Infant < 6lbs; Late-preterm 34-36.6wks  Visited with mom of 22 33 51/95 weeks old (adjusted) NICU female, she's a P3 and reports she's still having trouble with establishing a let down; this is affecting her supply since she has to pump for 40-60 minutes in order to keep up with her volumes. Reviewed some strategies/options discussed with previous LC, Ms. Mccleave voice she'll be trying the oxytocin nasal spray to facilitate her let down since she has already tried "everything else" such as breast massage, hand expression, warm/hot compresses and warm showers. She even considered switching to formula, her nipples are getting very sore. She's still using coconut oil prior pumping, when this LC fit her for a pumping band, nipples were reddish but skin was intact.  Objective Infant data: Mother's Current Feeding Choice: Breast Milk  Infant feeding assessment Scale for Readiness: 2  Maternal data: [id number]  C-Section, Low Transverse Pumping frequency: 6 times/24 hours Pumped volume: 90 mL (up to 120 ml in the AM) Flange Size: 27 WIC Program: Yes WIC Referral Sent?: Yes Pump: Personal (WIC pump)  Assessment Infant: In NICU  Maternal: Milk volume: Low  Intervention/Plan Interventions: DEBP; Education; Breast feeding basics reviewed  Tools: Pump; Flanges; Hands-free pumping top (in sizes "S/M" and "L" per mom's preference) Pump Education: Setup, frequency, and cleaning; Milk Storage  Plan of care: Encouraged mom to continue pumping consistently every 3 hours, the closer she can get to 8 pumping sessions/24 hours She'll continue doing STS care with baby She'll try the OTC nasal oxytocin spray; she already checked with her provider    No other support person at this time.  All questions and concerns answered, family to contact [facility] services PRN.  Consult Status: NICU follow-up  NICU Follow-up type: Weekly NICU follow up   [place] 10/01/2021, 2:12 PM

## 2021-10-03 ENCOUNTER — Other Ambulatory Visit (HOSPITAL_COMMUNITY): Payer: Self-pay

## 2021-10-08 ENCOUNTER — Ambulatory Visit: Payer: Medicaid Other | Admitting: Physical Therapy

## 2021-10-09 ENCOUNTER — Ambulatory Visit: Payer: Self-pay

## 2021-10-09 NOTE — Lactation Note (Signed)
This note was copied from a baby's chart.  NICU Lactation Consultation Note  Patient Name: Krystal [name] Today's Date: 10/09/2021 Age:33 m.o.  Subjective Reason for consult: Follow-up assessment; NICU baby; Infant < 6lbs; Early term 19-38.6wks  Visited with mom of 71 40/37 weeks old (adjusted) NICU female, she's a P3 and reports her supply has started to dwindle, she believes is due to maternal stress. MOB was very emotional during [facility] consultation and voiced that her mother (GOB) went into septic shock; she's also having trouble with trying to refill her anxiety medication and that all this stress is playing havoc on her supply. Provided emotional support to MOB and let her know that her mental health will always be paramount and that we will support her feeding plan whatever it is. She then said that pumping is actually somehow "relaxing" to her but that keeping with supply has been the real struggle. She wishes to have baby on a breastmilk diet at discharge and would like to continue working on pumping.  Objective Infant data: Mother's Current Feeding Choice: Breast Milk  Infant feeding assessment Scale for Readiness: 4  Maternal data: [id number]  C-Section, Low Transverse Pumping frequency: 6 times/24 hours Pumped volume: 30 mL (30-60 ml) Flange Size: 27 WIC Program: Yes WIC Referral Sent?: Yes Pump: Personal (WIC pump)  Assessment Infant: In NICU  Maternal: Milk volume: Low  Intervention/Plan Interventions: Breast feeding basics reviewed; DEBP; Education Tools: Flanges; Pump; Hands-free pumping top (in sizes "S/M" and "L" per mom's preference) Pump Education: Setup, frequency, and cleaning; Milk Storage  Plan of care: Encouraged mom to continue pumping consistently every 3 hours, but she may go at her own pace if pumping becomes too stressful  She'll continue doing STS care with baby She'll call for assistance once baby is ready to go to breast   Female visitor  present and very supportive. All questions and concerns answered, family to contact [facility] services PRN.  Consult Status: NICU follow-up  NICU Follow-up type: Weekly NICU follow up   [place] 10/09/2021, 2:56 PM

## 2021-10-12 ENCOUNTER — Encounter (HOSPITAL_COMMUNITY): Payer: Self-pay | Admitting: Emergency Medicine

## 2021-10-12 ENCOUNTER — Emergency Department (HOSPITAL_COMMUNITY): Payer: Medicaid Other

## 2021-10-12 ENCOUNTER — Emergency Department (HOSPITAL_COMMUNITY)
Admission: EM | Admit: 2021-10-12 | Discharge: 2021-10-12 | Disposition: A | Discharge status: Home / Self Care | Payer: Medicaid Other | Attending: Emergency Medicine | Admitting: Emergency Medicine

## 2021-10-12 DIAGNOSIS — R519 Headache, unspecified: Secondary | ICD-10-CM | POA: Diagnosis present

## 2021-10-12 DIAGNOSIS — I10 Essential (primary) hypertension: Secondary | ICD-10-CM | POA: Insufficient documentation

## 2021-10-12 DIAGNOSIS — R079 Chest pain, unspecified: Secondary | ICD-10-CM | POA: Insufficient documentation

## 2021-10-12 DIAGNOSIS — R112 Nausea with vomiting, unspecified: Secondary | ICD-10-CM | POA: Insufficient documentation

## 2021-10-12 DIAGNOSIS — Z79899 Other long term (current) drug therapy: Secondary | ICD-10-CM | POA: Insufficient documentation

## 2021-10-12 DIAGNOSIS — N9489 Other specified conditions associated with female genital organs and menstrual cycle: Secondary | ICD-10-CM | POA: Diagnosis not present

## 2021-10-12 DIAGNOSIS — J45909 Unspecified asthma, uncomplicated: Secondary | ICD-10-CM | POA: Diagnosis not present

## 2021-10-12 LAB — TROPONIN I (HIGH SENSITIVITY): Troponin I (High Sensitivity): 7 ng/L (ref <18)

## 2021-10-12 LAB — BASIC METABOLIC PANEL
Anion gap: 9 (ref 5–15)
BUN: 17 mg/dL (ref 6–20)
CO2: 26 mmol/L (ref 22–32)
Calcium: 9.8 mg/dL (ref 8.9–10.3)
Chloride: 105 mmol/L (ref 98–111)
Creatinine, Ser: 0.78 mg/dL (ref 0.44–1.00)
GFR, Estimated: >60 mL/min (ref >60)
Glucose, Bld: 126 mg/dL — ABNORMAL HIGH (ref 70–99)
Potassium: 3.8 mmol/L (ref 3.5–5.1)
Sodium: 140 mmol/L (ref 135–145)

## 2021-10-12 LAB — CBC
HCT: 45.5 % (ref 36.0–46.0)
Hemoglobin: 15.4 g/dL — ABNORMAL HIGH (ref 12.0–15.0)
MCH: 31.1 pg (ref 26.0–34.0)
MCHC: 33.8 g/dL (ref 30.0–36.0)
MCV: 91.9 fL (ref 80.0–100.0)
Platelets: 354 10*3/uL (ref 150–400)
RBC: 4.95 MIL/uL (ref 3.87–5.11)
RDW: 12 % (ref 11.5–15.5)
WBC: 8.6 10*3/uL (ref 4.0–10.5)
nRBC: 0 % (ref 0.0–0.2)

## 2021-10-12 LAB — I-STAT BETA HCG BLOOD, ED (MC, WL, AP ONLY): I-stat hCG, quantitative: <5 m[IU]/mL (ref <5)

## 2021-10-12 MED ORDER — SODIUM CHLORIDE 0.9 % IV BOLUS
1000.0000 mL | Freq: Once | INTRAVENOUS | Status: AC
  Administered 2021-10-12: 1000 mL via INTRAVENOUS

## 2021-10-12 MED ORDER — KETOROLAC TROMETHAMINE 15 MG/ML IJ SOLN
15.0000 mg | Freq: Once | INTRAMUSCULAR | Status: AC
  Administered 2021-10-12: 15 mg via INTRAVENOUS
  Filled 2021-10-12: qty 1

## 2021-10-12 MED ORDER — DROPERIDOL 2.5 MG/ML IJ SOLN
1.2500 mg | Freq: Once | INTRAMUSCULAR | Status: AC
  Administered 2021-10-12: 1.25 mg via INTRAVENOUS
  Filled 2021-10-12: qty 2

## 2021-10-12 MED ORDER — ONDANSETRON HCL 4 MG/2ML IJ SOLN
4.0000 mg | Freq: Once | INTRAMUSCULAR | Status: AC
  Administered 2021-10-12: 4 mg via INTRAVENOUS
  Filled 2021-10-12: qty 2

## 2021-10-12 MED ORDER — HYDRALAZINE HCL 20 MG/ML IJ SOLN
20.0000 mg | Freq: Once | INTRAMUSCULAR | Status: AC
  Administered 2021-10-12: 20 mg via INTRAVENOUS
  Filled 2021-10-12: qty 1

## 2021-10-12 MED ORDER — CARVEDILOL 12.5 MG PO TABS
25.0000 mg | ORAL_TABLET | ORAL | Status: AC
  Administered 2021-10-12: 25 mg via ORAL
  Filled 2021-10-12: qty 2

## 2021-10-12 MED ORDER — AMLODIPINE BESYLATE 5 MG PO TABS
10.0000 mg | ORAL_TABLET | Freq: Once | ORAL | Status: AC
Start: 2021-10-12 — End: 2021-10-12
  Administered 2021-10-12: 10 mg via ORAL
  Filled 2021-10-12: qty 2

## 2021-10-12 MED ORDER — DEXAMETHASONE SODIUM PHOSPHATE 10 MG/ML IJ SOLN
10.0000 mg | Freq: Once | INTRAMUSCULAR | Status: AC
Start: 2021-10-12 — End: 2021-10-12
  Administered 2021-10-12: 10 mg via INTRAVENOUS
  Filled 2021-10-12: qty 1

## 2021-10-12 MED ORDER — MAGNESIUM SULFATE 2 GM/50ML IV SOLN
2.0000 g | Freq: Once | INTRAVENOUS | Status: AC
  Administered 2021-10-12: 2 g via INTRAVENOUS
  Filled 2021-10-12: qty 50

## 2021-10-12 MED ORDER — ONDANSETRON 4 MG PO TBDP
4.0000 mg | ORAL_TABLET | Freq: Once | ORAL | Status: AC | PRN
  Administered 2021-10-12: 4 mg via ORAL
  Filled 2021-10-12: qty 1

## 2021-10-12 MED ORDER — LORAZEPAM 2 MG/ML IJ SOLN
0.5000 mg | Freq: Once | INTRAMUSCULAR | Status: AC
  Administered 2021-10-12: 0.5 mg via INTRAVENOUS
  Filled 2021-10-12: qty 1

## 2021-10-12 NOTE — Discharge Instructions (Signed)
You have been evaluated and managed for your recurrent headache.  I am glad that you are feeling better.  Please pump and dump your milk for the next 24 hours as you have received multiple medications in the ED that may have pass through your milk.  Follow-up with your doctor for further care.

## 2021-10-12 NOTE — ED Provider Notes (Signed)
[facility] EMERGENCY DEPARTMENT Provider Note   CSN: [id number] Arrival date & time: 10/12/21  6387     History  Chief Complaint  Patient presents with   Headache    Krystal [name] is a 31 y.o. female with medical history of anxiety, asthma, kidney stones, chronic migraines, uncontrolled hypertension.  Patient presents to ED for evaluation of headache, chest pain.  Patient reports that around 3 AM this morning she was awoken with a headache that she states began creeping of the back of her neck and is now causing pain throughout her entire head.  The patient reports that this headache is consistent with similar headaches she has had in the past, there are no features.  The patient does state that her photophobia, phonophobia is more severe with this headache however no other features are noted.  This patient suffers from chronic headaches, states that they worsened after emergency C-section on 07/21/2021 for gestational hypertension and HELLP syndrome.  The patient is seen by neurology, recently saw them on 09/30/2021.  Patient recently underwent head imaging to include MRI, MRA, MRV on 07/02/2021 which were unremarkable.  Patient states that she attempted taking ibuprofen and Tylenol without relief of symptoms.  Patient now complaining of headache, nausea, vomiting, chest tightness.  The patient denies any unilateral weakness or numbness, syncopal events.  Patient continues that she also developed chest tightness around 4:30 AM which is not associated with shortness of breath, not associated with exertion.     Headache Associated symptoms: nausea and vomiting   Associated symptoms: no fever, no numbness and no weakness        Home Medications Prior to Admission medications   Medication Sig Start Date End Date Taking? Authorizing Provider  acetaminophen (TYLENOL) 500 MG tablet Take 2 tablets (1,000 mg total) by mouth every 6 (six) hours as needed. 05/14/20   Bonnielee Haff, MD  albuterol (PROVENTIL HFA;VENTOLIN HFA) 108 (90 Base) MCG/ACT inhaler Inhale 2 puffs into the lungs every 6 (six) hours as needed for wheezing or shortness of breath.    [provider]  amLODipine (NORVASC) 10 MG tablet Take 1 tablet (10 mg total) by mouth daily. 05/31/21   Freada Bergeron, MD  budesonide-formoterol (SYMBICORT) 80-4.5 MCG/ACT inhaler Inhale 2 puffs into the lungs in the morning and at bedtime. Take for 1-2 weeks at a time during asthma flare. Rinse mouth after each use. 03/26/21   Garnet Sierras, DO  carvedilol (COREG) 25 MG tablet Take 1 tablet (25 mg total) by mouth 2 (two) times daily. 09/19/21   Freada Bergeron, MD  cetirizine (ZYRTEC ALLERGY) 10 MG tablet Take 1 tablet (10 mg total) by mouth 2 (two) times daily. 03/26/21   Garnet Sierras, DO  EPINEPHrine 0.3 mg/0.3 mL IJ SOAJ injection Inject 0.3 mg into the muscle as needed for anaphylaxis. 01/15/21   Garnet Sierras, DO  famotidine (PEPCID) 20 MG tablet Take 20 mg by mouth 2 (two) times daily. 08/09/21   [provider]  hydrALAZINE (APRESOLINE) 100 MG tablet Take 1 tablet (100 mg total) by mouth 3 (three) times daily. 05/14/20   Bonnielee Haff, MD  ibuprofen (ADVIL) 600 MG tablet Take 1 tablet (600 mg total) by mouth every 6 (six) hours as needed for fever, headache or moderate pain. 08/14/21   Genia Del, MD  levothyroxine (SYNTHROID) 125 MCG tablet Take 1 tablet (125 mcg total) by mouth daily before breakfast. 05/24/21   Philemon Kingdom, MD  LORazepam (ATIVAN) 1 MG tablet Take 2 tablets (2 mg total) by mouth every 8 (eight) hours as needed for anxiety. 08/30/21   Renard Matter, MD  pantoprazole (PROTONIX) 40 MG tablet Take 1 tablet (40 mg total) by mouth daily. Patient not taking: Reported on 09/30/2021 06/13/21   Woodroe Mode, MD  Prenatal Multivit-Min-Fe-FA (PRE-NATAL PO) Take 1 tablet by mouth daily.    [provider]  spironolactone (ALDACTONE) 25 MG tablet Take 1 tablet (25 mg total)  by mouth daily. 09/19/21   Freada Bergeron, MD  venlafaxine XR (EFFEXOR-XR) 75 MG 24 hr capsule Take 3 capsules (225 mg total) by mouth daily with breakfast. 08/30/21 10/29/21  Renard Matter, MD      Allergies    Red dye, Compazine [prochlorperazine maleate], Other, Vicodin [hydrocodone-acetaminophen], Amitriptyline hcl, Duloxetine hcl, Lisinopril, Reglan [metoclopramide], and Nifedipine    Review of Systems   Review of Systems  Constitutional:  Negative for fever.  Respiratory:  Positive for chest tightness. Negative for shortness of breath.   Cardiovascular:  Positive for chest pain.  Gastrointestinal:  Positive for nausea and vomiting.  Neurological:  Positive for headaches. Negative for syncope, weakness and numbness.  All other systems reviewed and are negative.   Physical Exam Updated Vital Signs BP (!) 206/133 (BP Location: Right Arm)   Pulse 68   Temp 97.7 F (36.5 C) (Oral)   Resp 19   SpO2 97%   Breastfeeding Unknown  Physical Exam Vitals and nursing note reviewed.  Constitutional:      General: She is not in acute distress.    Appearance: She is well-developed. She is not ill-appearing, toxic-appearing or diaphoretic.  HENT:     Head: Normocephalic and atraumatic.     Nose: Nose normal. No congestion.     Mouth/Throat:     Mouth: Mucous membranes are moist.     Pharynx: Oropharynx is clear.  Eyes:     Extraocular Movements: Extraocular movements intact.     Conjunctiva/sclera: Conjunctivae normal.     Pupils: Pupils are equal, round, and reactive to light.  Cardiovascular:     Rate and Rhythm: Normal rate and regular rhythm.  Pulmonary:     Effort: Pulmonary effort is normal.     Breath sounds: Normal breath sounds. No wheezing.  Abdominal:     General: Abdomen is flat. Bowel sounds are normal.     Palpations: Abdomen is soft.     Tenderness: There is no abdominal tenderness.  Musculoskeletal:     Cervical back: Normal range of motion and neck supple. No  tenderness.  Skin:    General: Skin is warm and dry.     Capillary Refill: Capillary refill takes less than 2 seconds.  Neurological:     General: No focal deficit present.     Mental Status: She is alert and oriented to person, place, and time.     GCS: GCS eye subscore is 4. GCS verbal subscore is 5. GCS motor subscore is 6.     Cranial Nerves: Cranial nerves 2-12 are intact. No cranial nerve deficit.     Sensory: Sensation is intact. No sensory deficit.     Motor: Motor function is intact. No weakness.     Coordination: Coordination is intact. Heel to [facility] Test normal.     ED Results / Procedures / Treatments   Labs (all labs ordered are listed, but only abnormal results are displayed) Labs Reviewed  BASIC METABOLIC PANEL - Abnormal; Notable for the  following components:      Result Value   Glucose, Bld 126 (*)    All other components within normal limits  CBC - Abnormal; Notable for the following components:   Hemoglobin 15.4 (*)    All other components within normal limits  I-STAT BETA HCG BLOOD, ED ([facility], [facility], [facility] ONLY)  TROPONIN I (HIGH SENSITIVITY)  TROPONIN I (HIGH SENSITIVITY)    EKG EKG Interpretation  Date/Time:  Saturday October 12 2021 08:35:59 EDT Ventricular Rate:  54 PR Interval:  142 QRS Duration: 114 QT Interval:  436 QTC Calculation: 413 R Axis:   47 Text Interpretation: Sinus bradycardia with sinus arrhythmia Confirmed by Godfrey Pick ([id number]) on 10/12/2021 12:16:03 PM  Radiology DG Chest 2 View  Result Date: 10/12/2021 CLINICAL DATA:  Pt states she has had chest pain, sob and severe headache since 3am today. Pt is 11 wks postpartum w/ c-section. EXAM: CHEST - 2 VIEW COMPARISON:  07/21/2021. FINDINGS: Cardiac silhouette is normal in size and configuration. Normal mediastinal and hilar contours. Clear lungs.  No pleural effusion or pneumothorax. Skeletal structures are intact. IMPRESSION: No active cardiopulmonary disease. Electronically Signed   By: Lajean Manes M.D.   On: 10/12/2021 09:11    Procedures Procedures   Medications Ordered in ED Medications  sodium chloride 0.9 % bolus 1,000 mL (has no administration in time range)  ondansetron (ZOFRAN) injection 4 mg (has no administration in time range)  dexamethasone (DECADRON) injection 10 mg (has no administration in time range)  hydrALAZINE (APRESOLINE) injection 20 mg (has no administration in time range)  carvedilol (COREG) tablet 25 mg (has no administration in time range)  amLODipine (NORVASC) tablet 10 mg (has no administration in time range)  LORazepam (ATIVAN) injection 0.5 mg (has no administration in time range)  ketorolac (TORADOL) 15 MG/ML injection 15 mg (has no administration in time range)  ondansetron (ZOFRAN-ODT) disintegrating tablet 4 mg (4 mg Oral Given 10/12/21 0831)    ED Course/ Medical Decision Making/ A&P                           Medical Decision Making Amount and/or Complexity of Data Reviewed Labs: ordered. Radiology: ordered.  Risk Prescription drug management.   31 year old female presents to ED for evaluation.  Please see HPI for further details.  On examination the patient is afebrile and nontachycardic.  Patient lung sounds clear bilaterally, not hypoxic on room air.  Patient abdomen soft and compressible in all 4 quadrants.  The patient neurological examination shows no focal neurodeficits.  Patient nontoxic in appearance.  Patient following commands appropriately.  Patient worked up utilizing the following labs and imaging studies interpreted by me personally: - Initial troponin 7, delta pending - BMP unremarkable - CBC unremarkable - Plain film imaging of patient chest unremarkable, no consolidation, effusion, cardiomegaly - CT head unremarkable - EKG nonischemic  Patient treated with 10 mg amlodipine, 25 mg carvedilol, 20 mg hydralazine.  Patient also treated with 4 mg Zofran, 50 mg Toradol, 10 mg Decadron for migraine.  Patient given 25  mg Ativan for anxiety.  Patient given 1 L normal saline patient was offered Compazine, Reglan but states that these medications make her "restless".  Patient has had multiple admissions for hypertensive urgency in the past.  We will try venous patient's blood pressure down, alleviate her headache and send her home so she can follow-up as an outpatient with her PCP.  At the end of my  shift, the patient work-up had not yet completed.  The patient will be signed out to [place] pending further work-up.  Patient stable.        Final Clinical Impression(s) / ED Diagnoses Final diagnoses:  None    Rx / DC Orders ED Discharge Orders     None         Azucena Cecil, PA-C 10/12/21 1534    Godfrey Pick, MD 10/14/21 1450

## 2021-10-12 NOTE — ED Triage Notes (Signed)
Patient complains of chest tightness that started at 0800 and a headache that started at 0300 this morning. Patient also reports multiple episodes of vomiting during the morning. Patient is alert and oriented at this time.

## 2021-10-12 NOTE — ED Provider Notes (Signed)
Received signout from previous provider, please see his note for complete H&P.  This is a 31 year old female with significant history of recurrent headache, poorly controlled hypertension, who is approximately 3 months postpartum presenting with headache.  She normally has headaches 3-4 times a week.  This morning her headache is more intense than usual with associate nausea and vomiting.  She was unable to keep any of her medications down.  Headache is described as a throbbing sensation all throughout.  When she came to the ER, her blood pressure was elevated in the 200s.  Head CT scan obtained fortunately without any acute finding.  Labs and imaging independently viewed interpreted by me and I agree with radiology interpretation.  Labs are reassuring, pregnancy test is negative, x-ray of the chest and EKG without any concerning finding.  Patient received migraine cocktail but report no significant improvement of his symptoms.  She mention her nausea did improve with Zofran.  She has had thorough work-up for her headache in the past including MRI, MRA, and MRV without any concerning finding.  I have also consulted on-call obstetricians, Dr. Kennon Rounds, who felt that patient is outside the window of complication from pregnancy and are clear from an OB standpoint.  Will continue to provide symptomatic treatment.  Care discussed with Dr. Matilde Sprang.   6:06 PM After receiving treatment and appropriate monitoring, patient reports she feels much better and request to be discharged home.  Recommend outpatient follow-up.  Return precaution given.  BP (!) 166/108   Pulse (!) 102   Temp 98.2 F (36.8 C)   Resp (!) 24   SpO2 91%   Breastfeeding Unknown  Please note O2 sat documented at 91% on room air however on room reexamination O2 sat is at 98% on room air patient without any shortness of breath  Results for orders placed or performed during the hospital encounter of 27/51/70  Basic metabolic panel  Result Value  Ref Range   Sodium 140 135 - 145 mmol/L   Potassium 3.8 3.5 - 5.1 mmol/L   Chloride 105 98 - 111 mmol/L   CO2 26 22 - 32 mmol/L   Glucose, Bld 126 (H) 70 - 99 mg/dL   BUN 17 6 - 20 mg/dL   Creatinine, Ser 0.78 0.44 - 1.00 mg/dL   Calcium 9.8 8.9 - 10.3 mg/dL   GFR, Estimated >60 >60 mL/min   Anion gap 9 5 - 15  CBC  Result Value Ref Range   WBC 8.6 4.0 - 10.5 K/uL   RBC 4.95 3.87 - 5.11 MIL/uL   Hemoglobin 15.4 (H) 12.0 - 15.0 g/dL   HCT 45.5 36.0 - 46.0 %   MCV 91.9 80.0 - 100.0 fL   MCH 31.1 26.0 - 34.0 pg   MCHC 33.8 30.0 - 36.0 g/dL   RDW 12.0 11.5 - 15.5 %   Platelets 354 150 - 400 K/uL   nRBC 0.0 0.0 - 0.2 %  I-Stat beta hCG blood, ED  Result Value Ref Range   I-stat hCG, quantitative <5.0 <5 mIU/mL   Comment 3          Troponin I (High Sensitivity)  Result Value Ref Range   Troponin I (High Sensitivity) 7 <18 ng/L   CT Head Wo Contrast  Result Date: 10/12/2021 CLINICAL DATA:  Headache.  Multiple episodes of vomiting. EXAM: CT HEAD WITHOUT CONTRAST TECHNIQUE: Contiguous axial images were obtained from the base of the skull through the vertex without intravenous contrast. RADIATION DOSE REDUCTION: This  exam was performed according to the departmental dose-optimization program which includes automated exposure control, adjustment of the mA and/or kV according to patient size and/or use of iterative reconstruction technique. COMPARISON:  07/02/2021 FINDINGS: Brain: No evidence of acute infarction, hemorrhage, hydrocephalus, extra-axial collection or mass lesion/mass effect. Vascular: No hyperdense vessel or unexpected calcification. Skull: Normal. Negative for fracture or focal lesion. Sinuses/Orbits: The paranasal sinuses and mastoid air cells are clear. Orbits appear normal. Other: None. IMPRESSION: 1. No acute intracranial abnormalities.  Normal brain. Electronically Signed   By: Kerby Moors M.D.   On: 10/12/2021 14:21   DG Chest 2 View  Result Date: 10/12/2021 CLINICAL  DATA:  Pt states she has had chest pain, sob and severe headache since 3am today. Pt is 11 wks postpartum w/ c-section. EXAM: CHEST - 2 VIEW COMPARISON:  07/21/2021. FINDINGS: Cardiac silhouette is normal in size and configuration. Normal mediastinal and hilar contours. Clear lungs.  No pleural effusion or pneumothorax. Skeletal structures are intact. IMPRESSION: No active cardiopulmonary disease. Electronically Signed   By: Lajean Manes M.D.   On: 10/12/2021 09:11       Domenic Moras, PA-C 10/12/21 1809    Kommor, Debe Coder, MD 10/13/21 2007

## 2021-10-16 ENCOUNTER — Ambulatory Visit: Payer: Medicaid Other

## 2021-10-17 ENCOUNTER — Other Ambulatory Visit: Payer: Self-pay | Admitting: Family Medicine

## 2021-10-17 MED ORDER — LORAZEPAM 1 MG PO TABS: 2.0000 mg | ORAL_TABLET | Freq: Three times a day (TID) | ORAL | 0 refills | Status: DC | PRN

## 2021-10-21 ENCOUNTER — Encounter (HOSPITAL_COMMUNITY): Payer: Self-pay | Admitting: Psychiatry

## 2021-10-21 ENCOUNTER — Ambulatory Visit (HOSPITAL_BASED_OUTPATIENT_CLINIC_OR_DEPARTMENT_OTHER): Payer: Medicaid Other | Admitting: Psychiatry

## 2021-10-21 DIAGNOSIS — F4310 Post-traumatic stress disorder, unspecified: Secondary | ICD-10-CM

## 2021-10-21 DIAGNOSIS — F332 Major depressive disorder, recurrent severe without psychotic features: Secondary | ICD-10-CM

## 2021-10-21 DIAGNOSIS — M797 Fibromyalgia: Secondary | ICD-10-CM | POA: Diagnosis not present

## 2021-10-21 DIAGNOSIS — F419 Anxiety disorder, unspecified: Secondary | ICD-10-CM | POA: Diagnosis not present

## 2021-10-21 DIAGNOSIS — F603 Borderline personality disorder: Secondary | ICD-10-CM

## 2021-10-21 MED ORDER — HYDROXYZINE HCL 25 MG PO TABS
25.0000 mg | ORAL_TABLET | Freq: Three times a day (TID) | ORAL | 0 refills | Status: DC | PRN
Start: 2021-10-21 — End: 2021-12-04

## 2021-10-21 MED ORDER — MIRTAZAPINE 15 MG PO TABS: 7.5000 mg | ORAL_TABLET | Freq: Every day | ORAL | 0 refills | Status: DC

## 2021-10-21 MED ORDER — CLONAZEPAM 1 MG PO TABS: 1.0000 mg | ORAL_TABLET | Freq: Every evening | ORAL | 0 refills | Status: DC | PRN

## 2021-10-21 NOTE — Patient Instructions (Addendum)
Thank you for attending your appointment today.  - Start Remeron 7.5 mg at bedtime - Stop taking the Ativan - Start Atarax 25 mg three times a day as needed for anxiety as a first line - Start Klonopin 1 mg daily as needed as a second line - Consider partial hospitalization program or IOP - Follow up in a week   Please do not make any changes to medications without first discussing with your provider. If you are experiencing a psychiatric emergency, please call 911 or present to your nearest emergency department. Additional crisis, medication management, and therapy resources are included below.  [facility]  [address] [place], [place] [address] [phone] or [phone] [facility] 24/7 FOR ANYONE [address], [place], [place] Fax: [phone] [email or web address] *Interpreters available *Accepts all insurance and uninsured for Urgent Care needs *Accepts Medicaid and uninsured for outpatient treatment (below)
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
## 2021-10-22 ENCOUNTER — Encounter: Payer: Self-pay | Admitting: Physical Therapy

## 2021-10-22 NOTE — Progress Notes (Deleted)
Cardio-Obstetrics Clinic  Follow Up Note   Date:  10/22/2021   ID:  Krystal [name], DOB Feb 07, 1991, MRN [id number]  PCP:  Audley Hose, MD   [facility] Providers Cardiologist:  Freada Bergeron, MD  Electrophysiologist:  None      Referring MD: Audley Hose, MD   Chief Complaint: HTN  History of Present Illness:    Krystal [name] is a 31 y.o. female [G5P0322] who returns for follow up of HTN.  Patient with extensive history of HTN diagnosed in her teens. Reportedly had secondary work-up at [facility] which was unrevealing (records not available). Also with history of preeclampsia and HELLP syndrome with 2 prior pre-term births at 47w5dand 344w3dShe was followed throughout her third pregnancy for difficult to control HTN and persistent nausea and vomiting making it hard to to tolerate PO medications. She ultimately required hospitalization on 06/2021 for severe hypertension, anisocaria and HA. CT/MRI were negative for acute findings. She had a prolonged hospitalization complicated by persistent HTN, nausea/vomiting requiring NGT placement, severe anxiety/depression, and HELLP syndrome. She underwent emergent c-section with bilateral salpingectomy on 07/21/21. She was discharged POD #3.   Today, the patient overall does not feel well. Blood pressure has been very elevated running up to 200s/130s. She is currently taking amlodipine '10mg'$ , hydralazine '100mg'$  TID, and labetalol '400mg'$  BID (cannot take higher dose due to severe nausea and vomiting). When BP go high, she gets HA and blurred vision. Occasional LE edema. Has some SOB with exertion which has overall improved since pregnancy but still present. Her nausea is significantly improved from pregnancy and she is tolerating PO much better.   Her baby remains in the NICU but is doing well. Reached 3lbs.   Prior CV Studies Reviewed: The following studies were reviewed today: TTE 0405/24/23IMPRESSIONS   1. Global  longitudinal strain is -20%. Left ventricular ejection  fraction, by estimation, is 60 to 65%. The left ventricle has normal  function. The left ventricle has no regional wall motion abnormalities.  There is mild left ventricular hypertrophy.  Left ventricular diastolic parameters were normal.   2. Right ventricular systolic function is normal. The right ventricular  size is normal.   3. Mild mitral valve regurgitation.   4. The aortic valve is tricuspid. Aortic valve regurgitation is not  visualized. Aortic valve sclerosis is present, with no evidence of aortic  valve stenosis.   5. The inferior vena cava is normal in size with greater than 50%  respiratory variability, suggesting right atrial pressure of 3 mmHg.   Past Medical History:  Diagnosis Date   Acute acalculous cholecystitis s/p lap cholecystectomy 05/11/2020 05/11/2020   Angio-edema 01/15/2021   Anxiety    Asthma    HELLP (hemolytic anemia/elev liver enzymes/low platelets in pregnancy) 12/08/2017   IC (interstitial cystitis)    Kidney stones    Migraines    [place]   left hand amputation   Severe uncontrolled hypertension 10/08/2017   UTI (urinary tract infection)     Past Surgical History:  Procedure Laterality Date   APPENDECTOMY     BIOPSY  05/10/2020   Procedure: BIOPSY;  Surgeon: OuArta SilenceMD;  [facility];  Service: Endoscopy;;   CESAREAN SECTION N/A 12/06/2017   Procedure: CESAREAN SECTION;  Surgeon: DaSloan LeiterMD;  [place]: [place]s;  Laterality: N/A;   CESAREAN SECTION  07/21/2021   Procedure: CESAREAN SECTION;  Surgeon: PrDonnamae JudeMD;  [facility];  Service: Obstetrics;;   CHOLECYSTECTOMY N/A 05/11/2020   Procedure: SINGLE SITE LAPAROSCOPIC CHOLECYSTECTOMY AND LIVER BIOSY;  Surgeon: Michael Boston, MD;  [facility];  Service: General;  Laterality: N/A;   ESOPHAGOGASTRODUODENOSCOPY (EGD) WITH PROPOFOL N/A 05/10/2020    Procedure: ESOPHAGOGASTRODUODENOSCOPY (EGD) WITH PROPOFOL;  Surgeon: Arta Silence, MD;  [facility];  Service: Endoscopy;  Laterality: N/A;   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     Allergies:   Red dye, Compazine [prochlorperazine maleate], Other, Vicodin [hydrocodone-acetaminophen], Amitriptyline hcl, Duloxetine hcl, Lisinopril, Reglan [metoclopramide], and Nifedipine   Social History   Socioeconomic History   Marital status: Married    Spouse name: Not on file   Number of children: Not on file   Years of education: Not on file   Highest education level: Not on file  Occupational History   Not on file  Tobacco Use   Smoking status: Never   Smokeless tobacco: Never   Tobacco comments:    She tried a cigarette and never smoked again  Vaping Use   Vaping Use: Never used  Substance and Sexual Activity   Alcohol use: Not Currently    Comment: bottle of wine a week-occ   Drug use: No   Sexual activity: Not Currently  Other Topics Concern   Not on file  Social History Narrative   Not on file   Social Determinants of Health   Financial Resource Strain: Not on file  Food Insecurity: No Food Insecurity (06/13/2021)   Hunger Vital Sign    Worried About Running Out of Food in the Last Year: Never true    Ran Out of Food in the Last Year: Never true  Transportation Needs: Unmet Transportation Needs (06/13/2021)   PRAPARE - Hydrologist (Medical): Yes    Lack of Transportation (Non-Medical): Yes  Physical  Activity: Not on file  Stress: Not on file  Social Connections: Not on file  {    Family History  Problem Relation Age of Onset   Hypertension Mother    Hypertension Father    {  ROS:   Please see the history of present illness.    All other systems reviewed and are negative.   Labs/EKG Reviewed:    EKG:   EKG is not ordered today.    Recent Labs: 06/10/2021: Magnesium 2.0; NT-Pro BNP 165 06/27/2021: B Natriuretic Peptide 28.9 07/03/2021: TSH 2.154 08/14/2021: ALT 18 10/12/2021: BUN 17; Creatinine, Ser 0.78; Hemoglobin 15.4; Platelets 354; Potassium 3.8; Sodium 140   Recent Lipid Panel No results found for: "CHOL", "TRIG", "HDL", "CHOLHDL", "LDLCALC", "LDLDIRECT"  Physical Exam:    VS:  There were no vitals taken for this visit.    Wt Readings  from Last 3 Encounters:  09/30/21 231 lb 6 oz (105 kg)  09/19/21 234 lb 9.6 oz (106.4 kg)  08/29/21 234 lb (106.1 kg)     GEN:  Well nourished, well developed in no acute distress HEENT: Normal NECK: No JVD; No carotid bruits CARDIAC: RRR, 2/6 systolic murmur RESPIRATORY:  Clear to auscultation without rales, wheezing or rhonchi  ABDOMEN: Soft, non-tender, non-distended MUSCULOSKELETAL:  No edema; No deformity  SKIN: Warm and dry NEUROLOGIC:  Alert and oriented x 3 PSYCHIATRIC:  Normal affect    Risk Assessment/Risk Calculators:     ASSESSMENT & PLAN:    #Chronic, Resistant HTN: #Hypertension in Pregnancy: #History of Pre-Eclampsia and HELLP Syndrome: Patient with long-standing history of chronic hypertension with history of HELLP syndrome and pre-eclampsia in all 3 of her pregnancies. Continues to have severely elevated blood pressures today in the postpartum period, but tolerating PO much better. She is currently breast feeding. Now that she is not pregnant, we have more antihypertensive options. Will also refer to Dr. Oval Linsey for work-up for secondary causes of HTN. -Refer to Dr. Oval Linsey for further work-up and  management of HTN (diagnosed in her teens) -Continue amlodipine '10mg'$  daily -Continue hydralazine '100mg'$  TID -Change labetalol to coreg '25mg'$  BID due to significant nausea with labetalol limiting dose titration (has previously tolerated coreg while breast feeding) -Start spironolactone '25mg'$  daily -BMET next week -Can add HCTZ if BP remains elevated -Will see her back in 4 weeks  .   There are no Patient Instructions on file for this visit.   Dispo:  No follow-ups on file.   Medication Adjustments/Labs and Tests Ordered: Current medicines are reviewed at length with the patient today.  Concerns regarding medicines are outlined above.  Tests Ordered: No orders of the defined types were placed in this encounter.  Medication Changes: No orders of the defined types were placed in this encounter.

## 2021-10-25 ENCOUNTER — Ambulatory Visit: Payer: Commercial Managed Care - HMO | Admitting: Cardiology

## 2021-10-25 ENCOUNTER — Ambulatory Visit: Payer: Self-pay

## 2021-10-25 NOTE — Lactation Note (Signed)
This note was copied from a baby's chart.  NICU Lactation Consultation Note  Patient Name: Krystal [name] Today's Date: 10/25/2021 Age:33 m.o.  Subjective Reason for consult: Follow-up assessment; NICU baby; Infant < 6lbs; Term  Visited with family of 46 33/69 weeks old (adjusted) NICU female, she's a P3 and reports she continues to pump and her supply remains stable, although is on the lower end. Ms. Bowmer tried power pumping but discontinued it after a week; she did noticed that her supply increased when doing it. She tried to order the oxytocin nasal spray but it's been out of stock at [facility] for a while, she voiced that she's still experiencing some difficulties with her let down but not as much as before. She also brought up that her supply has slightly increased since she switched pumps, she's now using the Zoomie Z2 pump which according to Ms. Standifer gives her higher volumes than the Symphony one from [facility].  Objective Infant data: Mother's Current Feeding Choice: Breast Milk  Infant feeding assessment Scale for Readiness: 3 Scale for Quality: 2  Maternal data: [id number]  C-Section, Low Transverse Pumping frequency: 6 times/24 hours Pumped volume: 30 mL (up to 60-90 ml in the AM) Flange Size: 27 No data recorded WIC Program: Yes WIC Referral Sent?: Yes Pump: Personal (WIC pump and Zomie Z2 since 10/19/21)  Assessment Infant: No data recorded No data recorded  Maternal: Milk volume: Low  Intervention/Plan Interventions: DEBP; Education Tools: Pump; Flanges Pump Education: Setup, frequency, and cleaning; Milk Storage  Plan of care: Encouraged mom to continue pumping consistently every 3 hours, but she may go at her own pace if pumping becomes too stressful  She'll resume power pumping on a daily basis She'll continue doing STS care with baby and work on some pre-feeding activities such as paci dips She'll call for assistance once baby is ready to go to  breast   No other support person at this time. All questions and concerns answered, family to contact [facility] services PRN.  Consult Status: NICU follow-up  NICU Follow-up type: Weekly NICU follow up   [place] 10/25/2021, 12:31 PM

## 2021-10-29 ENCOUNTER — Ambulatory Visit: Payer: Medicaid Other | Attending: Psychiatry | Admitting: Physical Therapy

## 2021-10-29 ENCOUNTER — Telehealth (HOSPITAL_BASED_OUTPATIENT_CLINIC_OR_DEPARTMENT_OTHER): Payer: Medicaid Other | Admitting: Psychiatry

## 2021-10-29 ENCOUNTER — Telehealth (HOSPITAL_COMMUNITY): Payer: Self-pay | Admitting: Psychiatry

## 2021-10-29 ENCOUNTER — Encounter (HOSPITAL_COMMUNITY): Payer: Self-pay | Admitting: Psychiatry

## 2021-10-29 DIAGNOSIS — F4310 Post-traumatic stress disorder, unspecified: Secondary | ICD-10-CM

## 2021-10-29 DIAGNOSIS — M542 Cervicalgia: Secondary | ICD-10-CM | POA: Insufficient documentation

## 2021-10-29 DIAGNOSIS — M62838 Other muscle spasm: Secondary | ICD-10-CM | POA: Insufficient documentation

## 2021-10-29 DIAGNOSIS — R252 Cramp and spasm: Secondary | ICD-10-CM | POA: Insufficient documentation

## 2021-10-29 DIAGNOSIS — R293 Abnormal posture: Secondary | ICD-10-CM | POA: Insufficient documentation

## 2021-10-29 DIAGNOSIS — F419 Anxiety disorder, unspecified: Secondary | ICD-10-CM

## 2021-10-29 DIAGNOSIS — F332 Major depressive disorder, recurrent severe without psychotic features: Secondary | ICD-10-CM

## 2021-10-29 DIAGNOSIS — M6281 Muscle weakness (generalized): Secondary | ICD-10-CM | POA: Insufficient documentation

## 2021-10-29 DIAGNOSIS — F603 Borderline personality disorder: Secondary | ICD-10-CM | POA: Diagnosis not present

## 2021-10-29 DIAGNOSIS — R279 Unspecified lack of coordination: Secondary | ICD-10-CM | POA: Insufficient documentation

## 2021-10-29 MED ORDER — VENLAFAXINE HCL ER 75 MG PO CP24: 225.0000 mg | ORAL_CAPSULE | Freq: Every day | ORAL | 1 refills | Status: DC
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
## 2021-10-29 NOTE — Telephone Encounter (Signed)
D:  Dr. Nelida Gores referred pt to [facility].  A: Placed call to pt.  Pt states she no longer has [place], but has [facility] [employer].  She inquired if there was a program that accepts MCD.  Discussed PHP with pt.  Will contact Loistine Chance, [facility] re: pt.  Inform Dr. Nelida Gores.  R:  Pt receptive.

## 2021-10-30 ENCOUNTER — Telehealth (HOSPITAL_COMMUNITY): Payer: Self-pay | Admitting: Professional

## 2021-10-30 ENCOUNTER — Ambulatory Visit (HOSPITAL_COMMUNITY): Payer: Commercial Managed Care - HMO

## 2021-10-31 ENCOUNTER — Ambulatory Visit: Payer: Medicaid Other

## 2021-10-31 DIAGNOSIS — M542 Cervicalgia: Secondary | ICD-10-CM | POA: Insufficient documentation

## 2021-10-31 NOTE — Therapy (Deleted)
OUTPATIENT PHYSICAL THERAPY CERVICAL EVALUATION   Patient Name: Krystal [name] MRN: [id number] DOB:01-12-1991, 31 y.o., female 37 Date: 10/31/2021    Past Medical History:  Diagnosis Date   Acute acalculous cholecystitis s/p lap cholecystectomy 05/11/2020 05/11/2020   Angio-edema 01/15/2021   Anxiety    Asthma    HELLP (hemolytic anemia/elev liver enzymes/low platelets in pregnancy) 12/08/2017   IC (interstitial cystitis)    Kidney stones    Migraines    Necrotizing fasciitis ([place])    left hand amputation   Severe uncontrolled hypertension 10/08/2017   UTI (urinary tract infection)    Past Surgical History:  Procedure Laterality Date   APPENDECTOMY     BIOPSY  05/10/2020   Procedure: BIOPSY;  Surgeon: Arta Silence, MD;  Location: [facility];  Service: Endoscopy;;   CESAREAN SECTION N/A 12/06/2017   Procedure: CESAREAN SECTION;  Surgeon: Sloan Leiter, MD;  Location: [facility];  Service: Obstetrics;  Laterality: N/A;   CESAREAN SECTION  07/21/2021   Procedure: CESAREAN SECTION;  Surgeon: Donnamae Jude, MD;  Location: [facility];  Service: Obstetrics;;   CHOLECYSTECTOMY N/A 05/11/2020   Procedure: SINGLE SITE LAPAROSCOPIC CHOLECYSTECTOMY AND LIVER BIOSY;  Surgeon: Michael Boston, MD;  Location: [facility];  Service: General;  Laterality: N/A;   ESOPHAGOGASTRODUODENOSCOPY (EGD) WITH PROPOFOL N/A 05/10/2020   Procedure: ESOPHAGOGASTRODUODENOSCOPY (EGD) WITH PROPOFOL;  Surgeon: Arta Silence, MD;  Location: [facility];  Service: Endoscopy;  Laterality: N/A;   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   HAND RECONSTRUCTION Right    INCISION AND DRAINAGE     NASAL SEPTUM SURGERY     Patient Active Problem List   Diagnosis Date Noted   Cervicalgia 10/31/2021   Borderline personality disorder in adult [facility]) 10/21/2021   Status post repeat low transverse cesarean section 07/21/2021   Intractable nausea and vomiting 07/05/2021   Chronic hypertension  with exacerbation during pregnancy in second trimester    Poor fetal growth affecting management of mother in second trimester    Headache in pregnancy, antepartum, second trimester    [redacted] weeks gestation of pregnancy    IUGR, antenatal 07/02/2021   Anisocoria 07/02/2021   History of excision of intestinal structure 05/23/2021   Attention deficit disorder 04/17/2021   Chronic fatigue syndrome 04/17/2021   Anosmia 04/17/2021   Supervision of high risk pregnancy, antepartum 04/01/2021   History of preterm delivery, currently pregnant 04/01/2021   Unwanted fertility 04/01/2021   Mild persistent asthma without complication 62/22/9798   Chronic urticaria 01/15/2021   Local reaction to hymenoptera sting 01/15/2021   Perennial allergic rhinitis 01/15/2021   MDD (major depressive disorder), recurrent episode, severe ([place]) 11/19/2020   Fibromyalgia 05/11/2020   Gastroesophageal reflux disease 05/11/2020   Interstitial cystitis 05/11/2020   Posttraumatic stress disorder 05/11/2020   Primary insomnia 05/11/2020   Essential hypertension 05/11/2020   Hypothyroidism due to Hashimoto's thyroiditis 10/03/2019   Anxiety 06/16/2019   History of uterine scar from previous surgery 06/07/2019   Obesity 06/07/2019   History of HELLP syndrome, currently pregnant 04/21/2019   Lumbar spondylosis 06/23/2018   Sacroiliac inflammation ([place]) 06/23/2018   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   History of upper limb amputation, wrist, 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   Chronic hypertension in pregnancy 10/08/2017    PCP: Audley Hose, MD  REFERRING PROVIDER: Genia Harold, MD   REFERRING DIAG: M54.2 (ICD-10-CM) - Cervicalgia   THERAPY DIAG:  Cervicalgia  Other muscle spasm  Muscle weakness (generalized)  Cramp and spasm  Rationale for Evaluation and Treatment Rehabilitation  ONSET DATE: 09/30/2021   SUBJECTIVE:                                                                                                                                                                                                          SUBJECTIVE STATEMENT: ***  PERTINENT HISTORY:  Recent ED visit on 10/12/21 This is a 31 year old female with significant history of recurrent headache, poorly controlled hypertension, who is approximately 3 months postpartum presenting with headache.  She normally has headaches 3-4 times a week.  This morning her headache is more intense than usual with associate nausea and vomiting.  She was unable to keep any of her medications down.  Headache is described as a throbbing sensation all throughout.  When she came to the ER, her blood pressure was elevated in the 200s.  Head CT scan obtained fortunately without any acute finding.  Labs and imaging independently viewed interpreted by me and I agree with radiology interpretation.  Labs are reassuring, pregnancy test is negative, x-ray of the chest and EKG without any concerning finding.   Patient received migraine cocktail but report no significant improvement of his symptoms.  She mention her nausea did improve with Zofran.  She has had thorough work-up for her headache in the past including MRI, MRA, and MRV without any concerning finding.  I have also consulted on-call obstetricians, Dr. Kennon Rounds, who felt that patient is outside the window of complication from pregnancy and are clear from an OB standpoint.  PAIN:  Are you having pain? Yes: NPRS scale: ***/10 Pain location: *** Pain description: *** Aggravating factors: *** Relieving factors: ***  PRECAUTIONS: None  WEIGHT BEARING RESTRICTIONS No  FALLS:  Has patient fallen in last 6 months? No  LIVING ENVIRONMENT: Lives with: lives with their family Lives in: {Lives in:[address]}   OCCUPATION: ***  PLOF: Independent, Independent with basic ADLs, Independent with household mobility without device, Independent with community mobility without device, Independent with homemaking with  ambulation, Independent with gait, and Independent with transfers  PATIENT GOALS ***  OBJECTIVE:   DIAGNOSTIC FINDINGS:  CT scan on 10/12/21 : IMPRESSION: 1. No acute intracranial abnormalities.  Normal brain.  PATIENT SURVEYS:  FOTO ***   COGNITION: Overall cognitive status: {cognition:24006}   SENSATION: {sensation:27233}  POSTURE: {posture:25561}  PALPATION: ***   CERVICAL ROM:   {AROM/PROM:27142} ROM A/PROM (deg) eval  Flexion   Extension   Right lateral flexion   Left lateral flexion   Right rotation   Left  rotation    (Blank rows = not tested)  UPPER EXTREMITY ROM:   UPPER EXTREMITY MMT:   CERVICAL SPECIAL TESTS:  Spurling's test: {pos/neg:25243}   FUNCTIONAL TESTS:  5 times sit to stand: *** Timed up and go (TUG): ***   TODAY'S TREATMENT:  Initial eval completed and initiated HEP   PATIENT EDUCATION:  Education details: *** Person educated: {Person educated:25204} Education method: {Education Method:25205} Education comprehension: {Education Comprehension:25206} Trigger Point Dry Needling  What is Trigger Point Dry Needling (DN)? DN is a physical therapy technique used to treat muscle pain and dysfunction. Specifically, DN helps deactivate muscle trigger points (muscle knots).  A thin filiform needle is used to penetrate the skin and stimulate the underlying trigger point. The goal is for a local twitch response (LTR) to occur and for the trigger point to relax. No medication of any kind is injected during the procedure.   What Does Trigger Point Dry Needling Feel Like?  The procedure feels different for each individual patient. Some patients report that they do not actually feel the needle enter the skin and overall the process is not painful. Very mild bleeding may occur. However, many patients feel a deep cramping in the muscle in which the needle was inserted. This is the local twitch response.   How Will I feel after the  treatment? Soreness is normal, and the onset of soreness may not occur for a few hours. Typically this soreness does not last longer than two days.  Bruising is uncommon, however; ice can be used to decrease any possible bruising.  In rare cases feeling tired or nauseous after the treatment is normal. In addition, your symptoms may get worse before they get better, this period will typically not last longer than 24 hours.   What Can I do After My Treatment? Increase your hydration by drinking more water for the next 24 hours. You may place ice or heat on the areas treated that have become sore, however, do not use heat on inflamed or bruised areas. Heat often brings more relief post needling. You can continue your regular activities, but vigorous activity is not recommended initially after the treatment for 24 hours. DN is best combined with other physical therapy such as strengthening, stretching, and other therapies.  [address] [place] [address].  [phone]    HOME EXERCISE PROGRAM: ***  ASSESSMENT:  CLINICAL IMPRESSION: Patient is a 31 y.o. female who was seen today for physical therapy evaluation and treatment for neck pain and chronic headaches.    OBJECTIVE IMPAIRMENTS decreased ROM, decreased strength, increased fascial restrictions, increased muscle spasms, impaired flexibility, and pain.   ACTIVITY LIMITATIONS carrying, lifting, sleeping, and reach over head  PARTICIPATION LIMITATIONS: meal prep, cleaning, laundry, driving, shopping, community activity, occupation, and yard work  PERSONAL FACTORS Fitness, Past/current experiences, and Time since onset of injury/illness/exacerbation are also affecting patient's functional outcome.   REHAB POTENTIAL: Good  CLINICAL DECISION MAKING: Stable/uncomplicated  EVALUATION COMPLEXITY: Low   GOALS: Goals reviewed with patient? Yes  SHORT TERM GOALS: Target date: 11/28/2021    Patient will be independent with initial HEP  Baseline: na Goal status: INITIAL  2.  Pain report to be no greater than 4/10  Baseline: na Goal status: INITIAL   LONG TERM GOALS: Target date: 12/26/2021  Patient to be independent with advanced HEP  Baseline: na Goal status: INITIAL  2.  Patient to report pain no greater than 2/10  Baseline: na  Goal status: INITIAL  3.  5 times sit to stand to improve by 3 sec Baseline: na Goal status: INITIAL  4.  Patient to be able to sleep through the night  Baseline: na Goal status: INITIAL  5.  Headaches reduced to 1 time per week Baseline: na Goal status: INITIAL  6.  Patient to report 85% improvement in overall symptoms Baseline: na Goal status: INITIAL   PLAN: PT FREQUENCY: 1-2x/week  PT DURATION: 8 weeks  PLANNED INTERVENTIONS: Therapeutic exercises, Therapeutic activity, Neuromuscular re-education, Balance training, Gait training, Patient/Family education, Self Care, Joint mobilization, Vestibular training, Canalith repositioning, Dry Needling, Electrical stimulation, Spinal mobilization, Cryotherapy, Moist heat, Taping, Traction, Ultrasound, Manual therapy, and Re-evaluation  PLAN FOR NEXT SESSION: Review HEP, begin postural strengthening, dry needling if patient agrees.     Isabel Caprice, PT 10/31/2021, 9:43 AM

## 2021-11-01 ENCOUNTER — Ambulatory Visit (HOSPITAL_COMMUNITY): Payer: Medicaid Other

## 2021-11-01 ENCOUNTER — Ambulatory Visit: Payer: Self-pay

## 2021-11-01 NOTE — Lactation Note (Signed)
This note was copied from a baby's chart.  NICU Lactation Consultation Note  Patient Name: Krystal [name] Today's Date: 11/01/2021 Age:33 m.o.  Subjective Reason for consult: Follow-up assessment; NICU baby; Infant < 6lbs; Term; Mother's request  Visited with mom of 53 30/56 weeks old (adjusted) NICU female, she's a P3 and reports she continues to pump and her supply remains stable. She also power pumps in the AM and noticed a slight increase in her supply this morning. She requested assistance with NICU LC because she needed to be fitted for another pumping top, provided one is size S/M per her request.   Objective Infant data: Mother's Current Feeding Choice: Breast Milk  Infant feeding assessment Scale for Readiness: 4 Scale for Quality: 4 (gagging)  Maternal data: [id number]  C-Section, Low Transverse Pumping frequency: 6 times/24 hours Pumped volume: 30 mL (30-60 ml (up to 105 ml this AM)) Flange Size: 27 WIC Program: Yes WIC Referral Sent?: Yes Pump: Personal (WIC pump and Zomie Z2 since 10/19/21)  Assessment Infant: In NICU  Maternal: Milk volume: Low  Intervention/Plan Interventions: DEBP; Education  Tools: Pump; Flanges; Hands-free pumping top (size S/M) Pump Education: Setup, frequency, and cleaning; Milk Storage  Plan of care: Encouraged to continue pumping consistently every 3 hours, but she may go at her own pace if pumping becomes too stressful  She'll continue power pumping on a daily basis She'll continue doing STS care with baby and work on some pre-feeding activities such as paci dips She'll call for latch assistance once baby is ready to go to breast   19 aunt present and very supportive, she was holding baby Krystal [name]. All questions and concerns answered, family to contact [facility] services PRN.  Consult Status: NICU follow-up  NICU Follow-up type: Weekly NICU follow up   [place] 11/01/2021, 3:49 PM

## 2021-11-05 ENCOUNTER — Ambulatory Visit (HOSPITAL_COMMUNITY): Payer: Medicaid Other | Admitting: Professional

## 2021-11-05 ENCOUNTER — Ambulatory Visit: Payer: Medicaid Other | Admitting: Physical Therapy

## 2021-11-05 DIAGNOSIS — M6281 Muscle weakness (generalized): Secondary | ICD-10-CM

## 2021-11-05 DIAGNOSIS — M62838 Other muscle spasm: Secondary | ICD-10-CM | POA: Diagnosis present

## 2021-11-05 DIAGNOSIS — R293 Abnormal posture: Secondary | ICD-10-CM | POA: Diagnosis present

## 2021-11-05 DIAGNOSIS — R279 Unspecified lack of coordination: Secondary | ICD-10-CM | POA: Diagnosis present

## 2021-11-05 DIAGNOSIS — R252 Cramp and spasm: Secondary | ICD-10-CM | POA: Diagnosis present

## 2021-11-05 DIAGNOSIS — M542 Cervicalgia: Secondary | ICD-10-CM | POA: Diagnosis present
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
## 2021-11-11 ENCOUNTER — Ambulatory Visit (INDEPENDENT_AMBULATORY_CARE_PROVIDER_SITE_OTHER): Payer: Medicaid Other | Admitting: Licensed Clinical Social Worker

## 2021-11-11 ENCOUNTER — Ambulatory Visit: Payer: Self-pay

## 2021-11-11 ENCOUNTER — Encounter (HOSPITAL_COMMUNITY): Payer: Self-pay

## 2021-11-11 DIAGNOSIS — F603 Borderline personality disorder: Secondary | ICD-10-CM | POA: Diagnosis not present

## 2021-11-11 DIAGNOSIS — F332 Major depressive disorder, recurrent severe without psychotic features: Secondary | ICD-10-CM | POA: Diagnosis not present

## 2021-11-11 DIAGNOSIS — F4310 Post-traumatic stress disorder, unspecified: Secondary | ICD-10-CM

## 2021-11-11 DIAGNOSIS — F419 Anxiety disorder, unspecified: Secondary | ICD-10-CM | POA: Diagnosis not present

## 2021-11-11 MED ORDER — VENLAFAXINE HCL ER 150 MG PO CP24: 300.0000 mg | ORAL_CAPSULE | Freq: Every day | ORAL | 0 refills | Status: DC

## 2021-11-11 NOTE — Progress Notes (Signed)
Spoke with patient via Webex video call, used 2 identifiers to correctly identify patient. States that her psychiatrist recommended PHP for anxiety and depression. She has panic attacks 5 times a week. Depression has been increasing. 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 She now has 3 children and a husband. Did not have a good relationship with her parents. On scale 1-10 as 10 being worst she rates depression at 6 and anxiety at 8. Denies SI/HI or AV hallucinations. PHQ9=25. No issues or complaints. No side effects from medications.

## 2021-11-11 NOTE — Lactation Note (Signed)
This note was copied from a baby's chart.  NICU Lactation Consultation Note  Patient Name: Krystal [name] Today's Date: 11/11/2021 Age:33 m.o.   Subjective Reason for consult: Follow-up assessment Krystal [name] continues to pump frequently but notices she yields a higher volume with hand expression. She would like to challenge infant at breast when infant is ready for po feedings.   Objective Infant data: Mother's Current Feeding Choice: Breast Milk  Infant feeding assessment Scale for Readiness: 3   Maternal data: [id number]  C-Section, Low Transverse  Pumping frequency: q3h Pumped volume: 75 mL   WIC Program: Yes WIC Referral Sent?: Yes Pump: Personal (WIC pump and Zomie Z2 since 10/19/21)  Assessment Maternal: Milk volume: Normal   Intervention/Plan Interventions: Education  Plan: Consult Status: NICU follow-up  NICU Follow-up type: Weekly NICU follow up  Continue to pump frequently and bring milk to NICU  Gwynne Edinger 11/11/2021, 3:58 PM
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
## 2021-11-12 ENCOUNTER — Ambulatory Visit (INDEPENDENT_AMBULATORY_CARE_PROVIDER_SITE_OTHER): Payer: Medicaid Other | Admitting: Licensed Clinical Social Worker

## 2021-11-12 ENCOUNTER — Encounter (HOSPITAL_COMMUNITY): Payer: Self-pay

## 2021-11-12 DIAGNOSIS — F332 Major depressive disorder, recurrent severe without psychotic features: Secondary | ICD-10-CM

## 2021-11-13 ENCOUNTER — Ambulatory Visit (INDEPENDENT_AMBULATORY_CARE_PROVIDER_SITE_OTHER): Payer: Medicaid Other | Admitting: Licensed Clinical Social Worker

## 2021-11-13 ENCOUNTER — Telehealth (HOSPITAL_COMMUNITY): Payer: Self-pay | Admitting: Licensed Clinical Social Worker

## 2021-11-13 DIAGNOSIS — F332 Major depressive disorder, recurrent severe without psychotic features: Secondary | ICD-10-CM

## 2021-11-13 DIAGNOSIS — F4310 Post-traumatic stress disorder, unspecified: Secondary | ICD-10-CM

## 2021-11-14 ENCOUNTER — Ambulatory Visit (INDEPENDENT_AMBULATORY_CARE_PROVIDER_SITE_OTHER): Payer: Medicaid Other | Admitting: Licensed Clinical Social Worker

## 2021-11-14 DIAGNOSIS — F332 Major depressive disorder, recurrent severe without psychotic features: Secondary | ICD-10-CM

## 2021-11-15 ENCOUNTER — Encounter: Payer: Self-pay | Admitting: Cardiology

## 2021-11-15 ENCOUNTER — Ambulatory Visit (INDEPENDENT_AMBULATORY_CARE_PROVIDER_SITE_OTHER): Payer: Medicaid Other | Admitting: Licensed Clinical Social Worker

## 2021-11-15 DIAGNOSIS — F332 Major depressive disorder, recurrent severe without psychotic features: Secondary | ICD-10-CM

## 2021-11-15 NOTE — Telephone Encounter (Signed)
Attempted phone call to pt.  OK per Epic to leave detailed message.  Pt advised Dr Johney Frame and her nurse are not in the office today but with current and worsening symptoms as well as very high blood pressure we have to advise that you bee seen in the emergency department for further evaluation.  Please do not wait to seek care.  Pt will be advised of office follow up after being see in the ED.

## 2021-11-17 ENCOUNTER — Other Ambulatory Visit: Payer: Self-pay | Admitting: Family Medicine

## 2021-11-18 ENCOUNTER — Ambulatory Visit (INDEPENDENT_AMBULATORY_CARE_PROVIDER_SITE_OTHER): Payer: Medicaid Other | Admitting: Licensed Clinical Social Worker

## 2021-11-18 DIAGNOSIS — F603 Borderline personality disorder: Secondary | ICD-10-CM

## 2021-11-18 DIAGNOSIS — F419 Anxiety disorder, unspecified: Secondary | ICD-10-CM | POA: Diagnosis not present

## 2021-11-18 DIAGNOSIS — M797 Fibromyalgia: Secondary | ICD-10-CM | POA: Diagnosis not present

## 2021-11-18 DIAGNOSIS — F332 Major depressive disorder, recurrent severe without psychotic features: Secondary | ICD-10-CM | POA: Diagnosis not present

## 2021-11-18 MED ORDER — CLONAZEPAM 1 MG PO TABS: 1.0000 mg | ORAL_TABLET | Freq: Two times a day (BID) | ORAL | 0 refills | Status: DC | PRN

## 2021-11-18 NOTE — Progress Notes (Signed)
Spoke with patient via Webex video call, used 2 identifiers to correctly identify patient. States she had a horrible weekend. Her mother in [place] wants to come visit for several weeks and they do not have a good relationship. She has trouble setting boundaries with her. Found out her baby may get an NG tube for feeding so that it can come home sooner. Was asking about her Klonopin being refilled. States Dr. Rosita Kea was suppose to send in a refill for her. She has been out since Saturday. Message sent to [facility] for review. On scale 1-10 as 10 being worst she rates depression at 8 and anxiety at 9. Denies SI/HI or AV hallucinations.

## 2021-11-18 NOTE — Progress Notes (Signed)
BH MD/PA/NP OP Progress Note  11/18/2021 12:55 PM Krystal [name]  MRN:  [id number]  Chief Complaint:  Chief Complaint  Patient presents with   Follow-up   HPI:  Patient is a 31 year old female with past psychiatric history of PTSD, MDD, anxiety, and BPD and medical history of Hashimoto thyroiditis, asthma, migraine, hypertension see virtually in [facility] program for follow up. Pt joined [facility] on 11/11/2021.  Pt reports that her mood is " not too good". She reports that over the weekend she ran out of her Klonopin and was feeling severely anxious.  She reports that she used hydroxyzine double dose to compensate for Klonopin.  Overall, she reports that she is benefiting from the [facility] program.  She is asking for refills for Klonopin.  She is still reporting poor sleep due to nightmares and poor appetite.  Discussed that we increased Effexor recently and it may take few weeks to see full effect. Currently, she denies any suicidal ideations, homicidal ideations, auditory and visual hallucinations.  She denies paranoia.  She denies any medication side effects and has been tolerating it well. She reports no change in her current stressors. Patient denies any need for change in medication and medication dosages. She denies any other concerns.    Visit Diagnosis:    ICD-10-CM   1. Severe episode of recurrent major depressive disorder, without psychotic features ([place])  F33.2     2. Borderline personality disorder in adult [facility])  F60.3     3. Anxiety  F41.9 clonazePAM (KLONOPIN) 1 MG tablet    4. Fibromyalgia  M79.7       Past Psychiatric History: Previous Psych Diagnoses: PTSD, MDD, anxiety, BPD.  Prior inpatient treatment: In 2022, she was admitted to [facility] observation area for couple of days Current meds: Effexor XR to 25 mg, Klonopin 1 mg OD as needed for anxiety, hydroxyzine 25 mg 3 times daily as needed for anxiety.  Patient follows Dr. Nelida Gores at [facility] clinic Psychotherapy hx: Was  getting therapy from [facility] every other week. Previous suicidal attempts: At 14, she was going to inject air in her veins but her mom walked in the room.  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   PTSD (post-traumatic stress disorder)    Severe uncontrolled hypertension 10/08/2017   UTI (urinary tract infection)     Past Surgical History:  Procedure Laterality Date   APPENDECTOMY     BIOPSY  05/10/2020   Procedure: BIOPSY;  Surgeon: Arta Silence, MD;  Location: [facility];  Service: Endoscopy;;   CESAREAN SECTION N/A 12/06/2017   Procedure: CESAREAN SECTION;  Surgeon: Sloan Leiter, MD;  Location: [place];  Service: Obstetrics;  Laterality: N/A;   CESAREAN SECTION  07/21/2021   Procedure: CESAREAN SECTION;  Surgeon: Donnamae Jude, MD;  [facility];  Service: Obstetrics;;   CHOLECYSTECTOMY N/A 05/11/2020   Procedure: SINGLE SITE LAPAROSCOPIC CHOLECYSTECTOMY AND LIVER BIOSY;  Surgeon: Michael Boston, MD;  Location: [facility];  Service: General;  Laterality: N/A;   ESOPHAGOGASTRODUODENOSCOPY (EGD) WITH PROPOFOL N/A 05/10/2020   Procedure: ESOPHAGOGASTRODUODENOSCOPY (EGD) WITH PROPOFOL;  Surgeon: Arta Silence, MD;  [facility];  Service: Endoscopy;  Laterality: N/A;   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   HAND RECONSTRUCTION Right    INCISION AND DRAINAGE     NASAL SEPTUM SURGERY      Family  Psychiatric History: Psych: Sister-depression, PTSD SA/HA: Denies    Family History:  Family History  Problem Relation Age of Onset   Hypertension Mother    Hypertension Father     Social History:  Social History   Socioeconomic History   Marital status: Married    Spouse name: Not on file   Number of children: 3   Years of education: Not on file   Highest education level: 8th grade  Occupational History   Not on file  Tobacco Use   Smoking status: Never   Smokeless tobacco: Never   Tobacco comments:    She tried a cigarette and never smoked again  Vaping Use   Vaping Use: Never used  Substance and Sexual Activity   Alcohol use: Yes    Comment: bottle of wine a week-occ   Drug use: No   Sexual activity: Not Currently  Other Topics Concern   Not on file  Social History Narrative   Not on file   Social Determinants of Health   Financial Resource Strain: Not on file  Food Insecurity: No Food Insecurity (06/13/2021)   Hunger Vital Sign    Worried About Running Out of Food in the Last Year: Never true    Ran Out of Food in the Last Year: Never true  Transportation Needs: Unmet Transportation Needs (06/13/2021)   PRAPARE - Hydrologist (Medical): Yes    Lack of Transportation (Non-Medical): Yes  Physical Activity: Not on file  Stress: Not on file  Social Connections: Not on file    Allergies:  Allergies  Allergen Reactions   Red Dye Other (See Comments)    ALL CAUSE PROBLEMS #40 IS WORST-MIGRAINES    Compazine [Prochlorperazine Maleate] Other (See Comments)    TWITCHING, CANT STAY STILL. Pt states she can tolerate promethazine   Other Other (See Comments)    FANSIDAR FOR MALARIA-ASTHMA   Vicodin [Hydrocodone-Acetaminophen] Itching, Nausea And Vomiting and Other (See Comments)    Pt states she can tolerate acetaminophen   Amitriptyline Hcl Other (See Comments)    Angry moods   Duloxetine Hcl Other (See Comments)    Passing out,  trimble, irritable   Lisinopril Cough   Reglan [Metoclopramide] Other (See Comments)    restless   Nifedipine Nausea And Vomiting and Other (See Comments)    Discussed with patient and maternal fetal medicine, patient has intolerance but not frank allergy.    Metabolic Disorder Labs: Lab Results  Component Value Date   HGBA1C 5.1 04/01/2021   No results found for: "PROLACTIN" No results found for: "CHOL", "TRIG", "HDL", "CHOLHDL", "VLDL", "LDLCALC" Lab Results  Component Value Date   TSH 2.154 07/03/2021   TSH 2.178 07/02/2021    Therapeutic Level Labs: No results found for: "LITHIUM" No results found for: "VALPROATE" No results found for: "CBMZ"  Current Medications: Current Outpatient Medications  Medication Sig Dispense Refill   acetaminophen (TYLENOL) 500 MG tablet Take 2 tablets (1,000 mg total) by mouth every 6 (six)  hours as needed. 30 tablet 0   albuterol (PROVENTIL HFA;VENTOLIN HFA) 108 (90 Base) MCG/ACT inhaler Inhale 2 puffs into the lungs every 6 (six) hours as needed for wheezing or shortness of breath.     amLODipine (NORVASC) 10 MG tablet Take 1 tablet (10 mg total) by mouth daily. 90 tablet 2   carvedilol (COREG) 25 MG tablet Take 1 tablet (25 mg total) by mouth 2 (two) times daily. 180 tablet 3   cetirizine (ZYRTEC ALLERGY) 10 MG tablet Take 1 tablet (10 mg total) by mouth 2 (two) times daily. 60 tablet 5   EPINEPHrine 0.3 mg/0.3 mL IJ SOAJ injection Inject 0.3 mg into the muscle as needed for anaphylaxis. 1 each 2   famotidine (PEPCID) 20 MG tablet Take 20 mg by mouth 2 (two) times daily.     hydrALAZINE (APRESOLINE) 100 MG tablet Take 1 tablet (100 mg total) by mouth 3 (three) times daily. 90 tablet 1   hydrOXYzine (ATARAX) 25 MG tablet Take 1 tablet (25 mg total) by mouth 3 (three) times daily as needed. 90 tablet 0   ibuprofen (ADVIL) 600 MG tablet Take 1 tablet (600 mg total) by mouth every 6 (six) hours as needed for fever, headache or moderate pain. 40  tablet 0   levothyroxine (SYNTHROID) 125 MCG tablet Take 1 tablet (125 mcg total) by mouth daily before breakfast. 45 tablet 3   Prenatal Multivit-Min-Fe-FA (PRE-NATAL PO) Take 1 tablet by mouth daily.     spironolactone (ALDACTONE) 25 MG tablet Take 1 tablet (25 mg total) by mouth daily. 90 tablet 3   venlafaxine XR (EFFEXOR-XR) 150 MG 24 hr capsule Take 2 capsules (300 mg total) by mouth daily. 60 capsule 0   clonazePAM (KLONOPIN) 1 MG tablet Take 1 tablet (1 mg total) by mouth 2 (two) times daily as needed for anxiety. 30 tablet 0   No current facility-administered medications for this visit.     Musculoskeletal: Strength & Muscle Tone: Not able to assess due to virtual visit Gait & Station: Not well to assess due to virtual visit Patient leans: N/A  Psychiatric Specialty Exam: Review of Systems  unknown if currently breastfeeding.There is no height or weight on file to calculate BMI.  General Appearance: Casual  Eye Contact:  Fair  Speech:  Clear and Coherent and Normal Rate  Volume:  Normal  Mood:  Anxious and Depressed  Affect:  Depressed  Thought Process:  Coherent and Linear  Orientation:  Full (Time, Place, and Person)  Thought Content: Logical   Suicidal Thoughts:  No  Homicidal Thoughts:  No  Memory:  Immediate;   Good Recent;   Good  Judgement:  Good  Insight:  Good  Psychomotor Activity:  Normal  Concentration:  Concentration: Good and Attention Span: Good  Recall:  Good  Fund of Knowledge: Good  Language: Good  Akathisia:  No  Handed:  Right  AIMS (if indicated): not done  Assets:  Communication Skills Desire for Improvement Housing Resilience Social Support  ADL's:  Intact  Cognition: WNL  Sleep:  Poor   Screenings: GAD-7    Flowsheet Row Routine Prenatal from 06/13/2021 in [facility] [place] [facility] Routine Prenatal from 05/23/2021 in [facility] [place] [facility] Initial  Prenatal from 04/01/2021 in [place] [facility] [place] [facility] Office Visit from 10/09/2020 in [place] [facility] [employer] [facility] Office Visit from 09/26/2020  in Center for [employer] at [facility]  Total GAD-7 Score '11 13 6 17 20      '$ PHQ2-9    Flowsheet Row Counselor from 11/11/2021 in [facility] Counselor from 11/05/2021 in [facility] Office Visit from 10/21/2021 in [facility] Routine Prenatal from 06/13/2021 in [facility] [place] [facility] Routine Prenatal from 05/23/2021 in [facility] [place] [facility]  PHQ-2 Total Score '6 6 4 2 2  '$ PHQ-9 Total Score '25 25 20 10 12      '$ Flowsheet Row Counselor from 11/11/2021 in [facility] Counselor from 11/05/2021 in [facility] Office Visit from 10/21/2021 in [facility]  C-SSRS RISK CATEGORY Error: Question 6 not populated Low Risk No Risk        Assessment and Plan:  Patient is a 31 year old female with past psychiatric history of PTSD, MDD, anxiety, and BPD and medical history of Hashimoto thyroiditis, asthma, migraine, hypertension see virtually in [facility] program for follow up. Pt joined [place] program on 11/11/2021.   MDD, Severe, recurrent PTSD BPD Anxiety  Plan: -Continue Effexor XR to 300 mg QD -Continue Atarax 25 mg TID prn for anxiety -Continue Klonopin 1 mg BID as needed for anxiety. 15 days Rx sent to Pt's pharmacy   - Follow up-next week  Collaboration of Care: Collaboration of Care: Other PHP team  Patient/Guardian was advised Release of Information must be obtained prior to any record release in order to collaborate their care with an outside provider. Patient/Guardian was advised if they  have not already done so to contact the registration department to sign all necessary forms in order for [place] to release information regarding their care.   Consent: Patient/Guardian gives verbal consent for treatment and assignment of benefits for services provided during this visit. Patient/Guardian expressed understanding and agreed to proceed.    Armando Reichert, MD PGY3 11/18/2021, 12:55 PM

## 2021-11-18 NOTE — Progress Notes (Deleted)
Cardio-Obstetrics Clinic  Follow Up Note   Date:  11/18/2021   ID:  Krystal [name], DOB 06-21-1990, MRN [id number]  PCP:  Audley Hose, MD   [facility] Providers Cardiologist:  Freada Bergeron, MD  Electrophysiologist:  None      Referring MD: Audley Hose, MD   Chief Complaint: HTN  History of Present Illness:    Krystal [name] is a 31 y.o. female [[id number]] who returns for follow up of HTN.  Patient with extensive history of HTN diagnosed in her teens. Reportedly had secondary work-up at [facility] which was unrevealing (records not available). Also with history of preeclampsia and HELLP syndrome with 2 prior pre-term births at 74w5dand 317w3dShe was followed throughout her third pregnancy for difficult to control HTN and persistent nausea and vomiting making it hard to to tolerate PO medications. She ultimately required hospitalization on 06/2021 for severe hypertension, anisocaria and HA. CT/MRI were negative for acute findings. She had a prolonged hospitalization complicated by persistent HTN, nausea/vomiting requiring NGT placement, severe anxiety/depression, and HELLP syndrome. She underwent emergent c-section with bilateral salpingectomy on 07/21/21. She was discharged POD #3.   Was last seen in clinic on 09/19/21 where her blood pressures remained elevated and running up to 200/130s. She continued to have difficulty tolerating labetalol and we changed her to coreg '25mg'$  BID. We also started spironolactone '25mg'$  daily in addition to her amlodipine '10mg'$  and hydralazine '100mg'$  TID. She was recommended to see Dr. RaOval Linseyor further management.  Today, ***  Prior CV Studies Reviewed: The following studies were reviewed today: TTE 04April 25, 2023IMPRESSIONS   1. Global longitudinal strain is -20%. Left ventricular ejection  fraction, by estimation, is 60 to 65%. The left ventricle has normal  function. The left ventricle has no regional wall motion  abnormalities.  There is mild left ventricular hypertrophy.  Left ventricular diastolic parameters were normal.   2. Right ventricular systolic function is normal. The right ventricular  size is normal.   3. Mild mitral valve regurgitation.   4. The aortic valve is tricuspid. Aortic valve regurgitation is not  visualized. Aortic valve sclerosis is present, with no evidence of aortic  valve stenosis.   5. The inferior vena cava is normal in size with greater than 50%  respiratory variability, suggesting right atrial pressure of 3 mmHg.   Past Medical History:  Diagnosis Date   Acute acalculous cholecystitis s/p lap cholecystectomy 05/11/2020 05/11/2020   Angio-edema 01/15/2021   Anxiety    Asthma    Depression    HELLP (hemolytic anemia/elev liver enzymes/low platelets in pregnancy) 12/08/2017   History of borderline personality disorder    IC (interstitial cystitis)    Kidney stones    Migraines    Necrotizing fasciitis ([place]   left hand amputation   PTSD (post-traumatic stress disorder)    Severe uncontrolled hypertension 10/08/2017   UTI (urinary tract infection)     Past Surgical History:  Procedure Laterality Date   APPENDECTOMY     BIOPSY  05/10/2020   Procedure: BIOPSY;  Surgeon: OuArta SilenceMD;  [facility];  Service: Endoscopy;;   CESAREAN SECTION N/A 12/06/2017   Procedure: CESAREAN SECTION;  Surgeon: DaSloan LeiterMD;  [facility]: [facility];  Laterality: N/A;   CESAREAN SECTION  07/21/2021   Procedure: CESAREAN SECTION;  Surgeon: PrDonnamae JudeMD;  [facility];  Service: Obstetrics;;   CHOLECYSTECTOMY N/A 05/11/2020   Procedure: SINGLE  SITE LAPAROSCOPIC CHOLECYSTECTOMY AND LIVER BIOSY;  Surgeon: Michael Boston, MD;  [facility];  Service: General;  Laterality: N/A;   ESOPHAGOGASTRODUODENOSCOPY (EGD) WITH PROPOFOL N/A 05/10/2020   Procedure: ESOPHAGOGASTRODUODENOSCOPY (EGD) WITH PROPOFOL;  Surgeon: Arta Silence, MD;  [facility];  Service: Endoscopy;  Laterality: N/A;   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     Allergies:   Red dye, Compazine [prochlorperazine maleate], Other, Vicodin [hydrocodone-acetaminophen], Amitriptyline hcl, Duloxetine hcl, Lisinopril, Reglan [metoclopramide], and Nifedipine   Social History   Socioeconomic History   Marital status: Married    Spouse name: Not on file   Number of children: 3   Years of education: Not on file   Highest education level: 8th grade  Occupational History   Not on file  Tobacco Use   Smoking status: Never   Smokeless tobacco: Never   Tobacco comments:    She tried a cigarette and never smoked again  Vaping Use   Vaping Use: Never used  Substance and Sexual Activity   Alcohol use: Yes    Comment: bottle of wine a week-occ   Drug use: No   Sexual activity: Not Currently  Other Topics Concern   Not on file  Social History Narrative   Not on file   Social Determinants of Health   Financial Resource Strain: Not on file  Food Insecurity: No Food Insecurity (06/13/2021)   Hunger Vital Sign    Worried About Running Out of Food in the Last Year: Never true    Ran Out of Food in the Last Year: Never true  Transportation Needs: Unmet Transportation Needs (06/13/2021)   PRAPARE - Hydrologist (Medical): Yes    Lack of Transportation (Non-Medical): Yes  Physical Activity: Not on file  Stress: Not on file  Social Connections: Not on file  {    Family  History  Problem Relation Age of Onset   Hypertension Mother    Hypertension Father    {  ROS:   Please see the history of present illness.    All other systems reviewed and are negative.   Labs/EKG Reviewed:    EKG:   EKG is not ordered today.    Recent Labs: 06/10/2021: Magnesium 2.0; NT-Pro BNP 165 06/27/2021: B Natriuretic Peptide 28.9 07/03/2021: TSH 2.154 08/14/2021: ALT 18 10/12/2021: BUN 17; Creatinine, Ser 0.78; Hemoglobin 15.4; Platelets 354; Potassium 3.8; Sodium 140   Recent Lipid Panel No results found for: "CHOL", "TRIG", "HDL", "CHOLHDL", "LDLCALC", "LDLDIRECT"  Physical Exam:    VS:  There were no vitals taken for this visit.    Wt Readings from Last 3 Encounters:  09/30/21 231 lb 6 oz (105 kg)  09/19/21 234 lb 9.6 oz (106.4 kg)  08/29/21 234 lb (106.1 kg)     GEN:  Well nourished, well developed in no acute distress HEENT: Normal NECK: No JVD; No carotid bruits CARDIAC: RRR, 2/6 systolic murmur RESPIRATORY:  Clear to auscultation without rales, wheezing or rhonchi  ABDOMEN: Soft, non-tender, non-distended MUSCULOSKELETAL:  No edema; No deformity  SKIN: Warm and dry NEUROLOGIC:  Alert and oriented x 3 PSYCHIATRIC:  Normal affect    Risk Assessment/Risk Calculators:     ASSESSMENT & PLAN:    #Chronic, Resistant HTN: #Hypertension in Pregnancy: #History of Pre-Eclampsia and HELLP Syndrome: Patient with long-standing history of chronic hypertension with history of HELLP syndrome and pre-eclampsia in all 3 of her pregnancies. Has had persistently elevated blood pressures in the postpartum period. *** -Refer to Dr. Oval Linsey for further work-up and management of HTN (diagnosed in her teens) -Continue amlodipine '10mg'$  daily -Continue hydralazine '100mg'$  TID -Continue coreg '25mg'$  BID  -Continue spironolactone '25mg'$  daily -Can add HCTZ if BP remains elevated -Will see her back in 4 weeks  .   There are no Patient Instructions on file for this  visit.   Dispo:  No follow-ups on file.   Medication Adjustments/Labs and Tests Ordered: Current medicines are reviewed at length with the patient today.  Concerns regarding medicines are outlined above.  Tests Ordered: No orders of the defined types were placed in this encounter.  Medication Changes: No orders of the defined types were placed in this encounter.

## 2021-11-19 ENCOUNTER — Ambulatory Visit (INDEPENDENT_AMBULATORY_CARE_PROVIDER_SITE_OTHER): Payer: Medicaid Other | Admitting: Licensed Clinical Social Worker

## 2021-11-19 ENCOUNTER — Encounter: Payer: Self-pay | Admitting: Physical Therapy

## 2021-11-19 DIAGNOSIS — F332 Major depressive disorder, recurrent severe without psychotic features: Secondary | ICD-10-CM

## 2021-11-20 ENCOUNTER — Ambulatory Visit (INDEPENDENT_AMBULATORY_CARE_PROVIDER_SITE_OTHER): Payer: Medicaid Other | Admitting: Licensed Clinical Social Worker

## 2021-11-20 DIAGNOSIS — F4310 Post-traumatic stress disorder, unspecified: Secondary | ICD-10-CM

## 2021-11-20 DIAGNOSIS — F332 Major depressive disorder, recurrent severe without psychotic features: Secondary | ICD-10-CM

## 2021-11-20 DIAGNOSIS — F603 Borderline personality disorder: Secondary | ICD-10-CM | POA: Diagnosis not present

## 2021-11-20 NOTE — Psych (Signed)
Virtual Visit via Video Note  I connected with Krystal [name] on 11/20/21 at  9:00 AM EDT by a video enabled telemedicine application and verified that I am speaking with the correct person using two identifiers.  Location: Patient: pt's home Provider: clinical home office   I discussed the limitations of evaluation and management by telemedicine and the availability of in person appointments. The patient expressed understanding and agreed to proceed.   I discussed the assessment and treatment plan with the patient. The patient was provided an opportunity to ask questions and all were answered. The patient agreed with the plan and demonstrated an understanding of the instructions.   The patient was advised to call back or seek an in-person evaluation if the symptoms worsen or if the condition fails to improve as anticipated.  I provided 240 minutes of non-face-to-face time during this encounter.   Heron Nay, LCSWA   [facility] PHP THERAPIST PROGRESS NOTE  Krystal [name] [id number]   Session Time: 9:00 am - 10:00 am  Participation Level: Active  Behavioral Response: CasualAlertAngry, Anxious, and Depressed  Type of Therapy: Group Therapy  Treatment Goals addressed: Coping  Progress Towards Goals: Progressing  Interventions: CBT, DBT, Solution Focused, Strength-based, Supportive, and Reframing  Therapist Response: Clinician led check-in regarding current stressors and situation, and review of patient completed daily inventory. Clinician utilized active listening and empathetic response and validated patient emotions. Clinician facilitated processing group on pertinent issues.?   Summary: Patient arrived within time allowed. Patient rates her mood at a 5 on a scale of 1-10 with 10 being best. Pt reported,  "After session yesterday, I got into bad arguments with my mom, siblings, and my husband." States she went to sleep after and her husband got her up at 8:30, but  she kept falling asleep. She reports some fleeting SI yesterday, but denies plan and intent. She reports she would like to discuss setting boundaries and how to "calm the rage." Pt able to process.?Pt engaged in discussion.?      Session Time: 10:00 am - 11:00 am  Participation Level: Active  Behavioral Response: CasualAlertAnxious, Depressed, and Irritable  Type of Therapy: Group Therapy  Treatment Goals addressed: Coping  Progress Towards Goals: Progressing  Interventions: CBT, DBT, Solution Focused, Strength-based, Supportive, and Reframing  Therapist Response:  Clinician led group on communication, boundaries and saying no, and self-compassion regarding making mistakes. Clinician utilized CBT principles to inform discussion.   Summary: Pt engaged in discussion. Pt reports she was afraid to say no and set boundaries with her mother until two years ago when they got into an argument and she realized how badly her mother has treated her. She details ways in which her mother and siblings have previously and recently crossed boundaries and increased her frustration. She provides support and encouragement to her peers. She also states that because mistakes were always punished when she was a child, she detaches herself whenever she makes a mistake now. She is receptive to feedback.    Session Time: 11:00 am - 12:00 pm  Participation Level: Active  Behavioral Response: CasualAlertAnxious and Depressed  Type of Therapy: Group Therapy, Spiritual Care  Treatment Goals addressed: Coping  Progress Towards Goals: Progressing  Interventions: Supportive, Education  Therapist Response: Alain Marion, Chaplain, led group.  Summary: Pt engaged in discussion.   Session Time: 12:00 pm - 1:00 pm  Participation Level: Active  Behavioral Response: CasualAlertAnxious and Depressed  Type of Therapy: Group Therapy  Treatment Goals  addressed: Coping  Progress Towards Goals:  Progressing  Interventions: CBT, DBT, Solution Focused, Strength-based, Supportive, and Reframing  Therapist Response: 12:00 - 12:50 pm: See OT note. 12:50 - 1:00 pm: Clinician led check-out. Clinician assessed for immediate needs, medication compliance and efficacy, and safety concerns?  Summary: 12:00 - 12:50 pm: See OT note 12:50 - 1:00 pm: At check-out, patient contracts for safety.?Patient demonstrates progress as evidenced by her continued engagement and by being receptive to treatment. Patient denies SI/HI/self-harm thoughts at the end of group and agrees to seek help should those thoughts/feelings occur.?   Suicidal/Homicidal: Nowithout intent/plan  Plan: ?Pt will continue in PHP and medication management while continuing to work on decreasing depression symptoms,?SI, and anxiety symptoms,?and increasing the ability to self manage symptoms.    Collaboration of Care: Medication Management AEB Dr. Rosita Kea.  Patient/Guardian was advised Release of Information must be obtained prior to any record release in order to collaborate their care with an outside provider. Patient/Guardian was advised if they have not already done so to contact the registration department to sign all necessary forms in order for [place] to release information regarding their care.   Consent: Patient/Guardian gives verbal consent for treatment and assignment of benefits for services provided during this visit. Patient/Guardian expressed understanding and agreed to proceed.   Diagnosis: Borderline personality disorder in adult [facility]) [F60.3]    1. Borderline personality disorder in adult [facility])   2. Severe episode of recurrent major depressive disorder, without psychotic features ([place])   3. Posttraumatic stress disorder       [facility] THERAPIST 11/20/2021

## 2021-11-21 ENCOUNTER — Ambulatory Visit (INDEPENDENT_AMBULATORY_CARE_PROVIDER_SITE_OTHER): Payer: Medicaid Other | Admitting: Licensed Clinical Social Worker

## 2021-11-21 DIAGNOSIS — F603 Borderline personality disorder: Secondary | ICD-10-CM

## 2021-11-21 DIAGNOSIS — F332 Major depressive disorder, recurrent severe without psychotic features: Secondary | ICD-10-CM

## 2021-11-21 DIAGNOSIS — F4310 Post-traumatic stress disorder, unspecified: Secondary | ICD-10-CM

## 2021-11-21 NOTE — Psych (Signed)
Virtual Visit via Video Note  I connected with Krystal [name] on 11/21/21 at  9:00 AM EDT by a video enabled telemedicine application and verified that I am speaking with the correct person using two identifiers.  Location: Patient: private hospital room Provider: clinical home office   I discussed the limitations of evaluation and management by telemedicine and the availability of in person appointments. The patient expressed understanding and agreed to proceed.  I discussed the assessment and treatment plan with the patient. The patient was provided an opportunity to ask questions and all were answered. The patient agreed with the plan and demonstrated an understanding of the instructions.   The patient was advised to call back or seek an in-person evaluation if the symptoms worsen or if the condition fails to improve as anticipated.  I provided 240 minutes of non-face-to-face time during this encounter.   Heron Nay, LCSWA   [facility] PHP THERAPIST PROGRESS NOTE  Krystal [name] [id number]   Session Time: 9:00 am - 10:00 am  Participation Level: Active  Behavioral Response: CasualAlertAnxious and Depressed  Type of Therapy: Group Therapy  Treatment Goals addressed: Coping  Progress Towards Goals: Progressing  Interventions: CBT, DBT, Solution Focused, Strength-based, Supportive, and Reframing  Therapist Response: Clinician led check-in regarding current stressors and situation, and review of patient completed daily inventory. Clinician utilized active listening and empathetic response and validated patient emotions. Clinician facilitated processing group on pertinent issues.?   Summary: Patient arrived within time allowed. Patient rates her mood at a h on a scale of 1-10 with 10 being best. Pt reported, "Trying to process everything. I almost feel like my brain is too exhausted to make sense of stuff right now." She states she thinks she could benefit from a  day off from group. She reports her sleep was "strangely good" last night, as she is staying in the hospital with her baby. She reports her appetite was good. Denies SI/SH thoughts. Pt able to process.?Pt engaged in discussion.?      Session Time: 10:00 am - 11:00 am  Participation Level: Active  Behavioral Response: CasualAlertAnxious, Depressed, and tearful  Type of Therapy: Group Therapy  Treatment Goals addressed: Coping  Progress Towards Goals: Progressing  Interventions: CBT, DBT, Solution Focused, Strength-based, Supportive, and Reframing  Therapist Response: Clinician led group on anxiety, grief and loss, and coping with living alone. Clinician utilized CBT principles to inform discussion.   Summary: Pt engaged in discussion. Pt reports she typically feels depressed and then will feel anxious because she feels depressed. She then shares her experience with grief and loss, having lost her grandparents, father, and close friend. She is supportive and encouraging toward her peers.    Session Time: 11:00 am - 12:00 pm  Participation Level: Active  Behavioral Response: CasualAlertAnxious and Depressed  Type of Therapy: Group Therapy  Treatment Goals addressed: Coping  Progress Towards Goals: Progressing  Interventions: CBT, DBT, Solution Focused, Strength-based, Supportive, and Reframing  Therapist Response: Clinician led group on Cognitive Distortions. Clinician listed different types of cognitive distortions and provided examples. Pts were asked to provide personal examples of cognitive distortions they currently experience or have previously experienced.  Summary: Pt engaged in discussion. Pt reports she recognizes that she gets stuck in double standards. She demonstrates good insight into the subject matter. She reports she will not be in group tomorrow and agrees to practice writing reframing statements outside of group.   Session Time: 12:00 pm - 1:00  pm  Participation Level: Active  Behavioral Response: CasualAlertDepressed  Type of Therapy: Group Therapy  Treatment Goals addressed: Coping  Progress Towards Goals: Progressing  Interventions: CBT, DBT, Solution Focused, Strength-based, Supportive, and Reframing  Therapist Response: 12:00 - 12:50 pm: See OT note. 12:50 - 1:00 pm: Clinician led check-out. Clinician assessed for immediate needs, medication compliance and efficacy, and safety concerns?  Summary: 12:00 - 12:50 pm: See OT note 12:50 - 1:00 pm: At check-out, patient contracts for safety.?Patient demonstrates progress as evidenced by her continued engagement and by being receptive to treatment. Patient denies SI/HI/self-harm thoughts at the end of group and agrees to seek help should those thoughts/feelings occur.?   Suicidal/Homicidal: Nowithout intent/plan  Plan: ?Pt will continue in PHP and medication management while continuing to work on decreasing depression symptoms,?SI, and anxiety symptoms,?and increasing the ability to self manage symptoms.   Collaboration of Care: Medication Management AEB Dr. Rosita Kea  Patient/Guardian was advised Release of Information must be obtained prior to any record release in order to collaborate their care with an outside provider. Patient/Guardian was advised if they have not already done so to contact the registration department to sign all necessary forms in order for [place] to release information regarding their care.   Consent: Patient/Guardian gives verbal consent for treatment and assignment of benefits for services provided during this visit. Patient/Guardian expressed understanding and agreed to proceed.   Diagnosis: Borderline personality disorder in adult [facility]) [F60.3]    1. Borderline personality disorder in adult [facility])   2. Posttraumatic stress disorder   3. Severe episode of recurrent major depressive disorder, without psychotic features ([place])       [facility]  THERAPIST 11/21/2021

## 2021-11-22 ENCOUNTER — Ambulatory Visit: Payer: Commercial Managed Care - HMO | Admitting: Cardiology

## 2021-11-22 ENCOUNTER — Ambulatory Visit (HOSPITAL_COMMUNITY): Payer: Medicaid Other

## 2021-11-22 ENCOUNTER — Telehealth (HOSPITAL_COMMUNITY): Payer: Self-pay | Admitting: Professional

## 2021-11-22 ENCOUNTER — Encounter (HOSPITAL_COMMUNITY): Payer: Self-pay

## 2021-11-22 NOTE — Telephone Encounter (Signed)
Cln called pt to f/u on email from pt about being confused. Pt allowed cln to speak with husband abt last night's events with husband, Aaron Edelman. Husband and pt report pt accussed husband of trying to take her kids and she has a 4 hour window she doesn't remember. Pt started talking about leaving/divorce. Pt "made remarks about selfharm. She said she wouldn't do it. She ended up working herself up so much that she passed out from the stress." Husband reports he called pt's sisters and wanted to call and ambulance and she reports she will make them leave. Pt then called husband and said she did not remember the last 4 hours. Cln discussed evaluation if this happens again. Pt denies SI/HI at this time. Pt reports she was unsure if she had SI/HI during her "black out" because she can't remember. Cln, pt, and husband discuss coping skills pt can use- pt reports sensory reset works- showering, going outside, gardening. "There are just days I get covered in rage and it feels like someone else takes over my body." Cln discussed safe space, inside and outside. 5-4-3-2-1 and grounding techniques. Pt asked if she could do virtual counseling from [place] at her sisters house and cln explained the reasons she cannot. Pt reports understanding. Cln encourages pt to call [place] [facility] to see if they accept [facility] and to call insurance benefits number to ask for referrals. This cln will also email pt more referrals. Pt denies SI/HI and reports she will reach out to supports if that changes.

## 2021-11-25 ENCOUNTER — Ambulatory Visit (INDEPENDENT_AMBULATORY_CARE_PROVIDER_SITE_OTHER): Payer: Medicaid Other | Admitting: Licensed Clinical Social Worker

## 2021-11-25 ENCOUNTER — Encounter: Payer: Self-pay | Admitting: Physical Therapy

## 2021-11-25 DIAGNOSIS — F4310 Post-traumatic stress disorder, unspecified: Secondary | ICD-10-CM | POA: Diagnosis not present

## 2021-11-25 DIAGNOSIS — F419 Anxiety disorder, unspecified: Secondary | ICD-10-CM

## 2021-11-25 DIAGNOSIS — F332 Major depressive disorder, recurrent severe without psychotic features: Secondary | ICD-10-CM

## 2021-11-25 MED ORDER — QUETIAPINE FUMARATE 25 MG PO TABS: 25.0000 mg | ORAL_TABLET | Freq: Every day | ORAL | 0 refills | Status: DC

## 2021-11-25 MED ORDER — VENLAFAXINE HCL ER 75 MG PO CP24: 225.0000 mg | ORAL_CAPSULE | Freq: Every day | ORAL | 0 refills | Status: DC

## 2021-11-25 NOTE — Psych (Signed)
Virtual Visit via Video Note  I connected with Krystal [name] on 11/25/21 at  9:00 AM EDT by a video enabled telemedicine application and verified that I am speaking with the correct person using two identifiers.  Location: Patient: car  Provider: clinical home office   I discussed the limitations of evaluation and management by telemedicine and the availability of in person appointments. The patient expressed understanding and agreed to proceed.  I discussed the assessment and treatment plan with the patient. The patient was provided an opportunity to ask questions and all were answered. The patient agreed with the plan and demonstrated an understanding of the instructions.   The patient was advised to call back or seek an in-person evaluation if the symptoms worsen or if the condition fails to improve as anticipated.  Pt was provided 240 minutes of non-face-to-face time during this encounter.   Lorin Glass, LCSW   [facility] PHP THERAPIST PROGRESS NOTE  Krystal [name] [id number]   Session Time: 9:00 am - 10:00 am  Participation Level: Active  Behavioral Response: CasualAlertAnxious and Depressed  Type of Therapy: Group Therapy  Treatment Goals addressed: Coping  Progress Towards Goals: Progressing  Interventions: CBT, DBT, Solution Focused, Strength-based, Supportive, and Reframing  Therapist Response: Clinician led check-in regarding current stressors and situation, and review of patient completed daily inventory. Clinician utilized active listening and empathetic response and validated patient emotions. Clinician facilitated processing group on pertinent issues.?   Summary: Patient arrived within time allowed. Patient rates her mood at a 6 on a scale of 1-10 with 10 being best. Pt states feeling "pretty good." Pt reports she has been staying with her sister since Friday and is feeling less stressed. Pt reports her sister came to pick her up after she heard what  pt is struggling with. Pt reports emotion dysregulation and struggles de-escalating over the weekend. Pt denies SI since Thursday. Pt able to process.?Pt engaged in discussion.?      Session Time: 10:00 am - 11:00 am  Participation Level: Active  Behavioral Response: CasualAlertAnxious, Depressed, and tearful  Type of Therapy: Group Therapy  Treatment Goals addressed: Coping  Progress Towards Goals: Progressing  Interventions: CBT, DBT, Solution Focused, Strength-based, Supportive, and Reframing  Therapist Response: Cln led discussion on loss of appetite and the importance of nutrition. Cln discussed the role nutrition can play in emotion regulation. Group members discussed the barriers they have to eating when they are symptomatic and worked to brainstorm ways to work around them.   Summary: Pt states struggling with the motivation to make food and forgetting to eat. Pt able to determine steps to manage.      Session Time: 11:00 am - 12:00 pm  Participation Level: Active  Behavioral Response: CasualAlertAnxious and Depressed  Type of Therapy: Group Therapy  Treatment Goals addressed: Coping  Progress Towards Goals: Progressing  Interventions: CBT, DBT, Solution Focused, Strength-based, Supportive, and Reframing  Therapist Response: Cln led discussion on family dynamics and the way in which they impact [place]. Group members shared struggles with their families and the way the patterns of behaviors have negatively impacted them. Cln provided space to process and validated pt's experiences.   Summary: Pt engaged in discussion and shares struggling with the ways her mother did not protect her as a child and whether she wants to have a relationship with her at all. Pt able to process.     Session Time: 12:00 pm - 1:00 pm  Participation Level: Active  Behavioral  Response: CasualAlertAnxious and Depressed  Type of Therapy: Group Therapy  Treatment Goals addressed:  Coping  Progress Towards Goals: Progressing  Interventions: CBT, DBT, Solution Focused, Strength-based, Supportive, and Reframing  Therapist Response: 12:00 - 12:50 pm: See OT note.  12:50 - 1:00 pm: Clinician led check-out. Clinician assessed for immediate needs, medication compliance and efficacy, and safety concerns?  Summary: 12:00 - 12:50 pm: See OT note   12:50 - 1:00 pm: At check-out, patient contracts for safety.?Patient demonstrates progress as evidenced by her continued engagement and efforts. Patient denies SI/HI/self-harm thoughts at the end of group and agrees to seek help should those thoughts/feelings occur.?   Suicidal/Homicidal: Nowithout intent/plan  Plan: ?Pt will discharge from PHP due to needing to stay out of state with her sister for the week. Pt cannot engage in group while out of state, however reports for safety pt cannot stay at home right now. Pt will f/u with [facility] for outpatient treatment. Pt denies SI/HI at time of discharge.    Collaboration of Care: Medication Management AEB Dr. Rosita Kea  Patient/Guardian was advised Release of Information must be obtained prior to any record release in order to collaborate their care with an outside provider. Patient/Guardian was advised if they have not already done so to contact the registration department to sign all necessary forms in order for [place] to release information regarding their care.   Consent: Patient/Guardian gives verbal consent for treatment and assignment of benefits for services provided during this visit. Patient/Guardian expressed understanding and agreed to proceed.   Diagnosis: Posttraumatic stress disorder [F43.10]    1. Posttraumatic stress disorder   2. Severe episode of recurrent major depressive disorder, without psychotic features ([place])   3. Anxiety       Lorin Glass, LCSW 11/25/2021

## 2021-11-25 NOTE — Progress Notes (Signed)
[place] MD/PA/NP OP Progress Note  11/25/2021 10:45 AM Krystal [name]  MRN:  [id number] Virtual Visit via Video Note  I connected with Krystal [name] on 11/25/21 at  9:00 AM EDT by a video enabled telemedicine application and verified that I am speaking with the correct person using two identifiers.  Location: Patient: Car Provider: Clinic   I discussed the limitations of evaluation and management by telemedicine and the availability of in person appointments. The patient expressed understanding and agreed to proceed.   I discussed the assessment and treatment plan with the patient. The patient was provided an opportunity to ask questions and all were answered. The patient agreed with the plan and demonstrated an understanding of the instructions.   The patient was advised to call back or seek an in-person evaluation if the symptoms worsen or if the condition fails to improve as anticipated.  I provided  15 minutes of non-face-to-face time during this encounter.   Armando Reichert, MD  Chief Complaint:  Chief Complaint  Patient presents with   Follow-up   HPI:  Patient is a 31 year old female with past psychiatric history of PTSD, MDD, anxiety, and BPD and medical history of Hashimoto thyroiditis, asthma, migraine, hypertension see virtually in [facility] program for follow up. Pt joined [place] program on 11/11/2021.  Pt is in her car at the time of this follow-up session. Patient reports that her mood is "not very good".  She reports that her anxiety was too bad on Friday that she asked her sister to pick her up from [place].  She reports that over the weekend she had an argument with her husband, she was paranoid about him taking kids and was thinking about divorce.  She reports that she accused him of taking her kids.  She reports that kids are with her husband currently. She is currently going to her sister's house in [place] and is planning to stay there for some time. She'll also  taking therapy session there.  She is tearful and reports worsening of her anxiety.  She reports that she is sleeping better at her sister's house.  She reports stable appetite.  She reports that using in Effexor is making her feel tired and she has no energy to do anything.  She is asking if she can take Effexor at night.  Discussed that if Effexor makes her feel sleepy then she can take Effexor at night.  Currently, she denies any suicidal ideations, homicidal ideations, auditory and visual hallucinations.  She contracts for safety at this time.  Safety planning done with patient and asked her to call [phone] or go to the nearest emergency room if she does not feel safe at home and has self-harm thoughts.  She is endorsing paranoia.  She denies any medication side effects and has been tolerating it well. Pt is currently nursing and pumping milk.  Discussed starting Seroquel to help with mood stabilization and decreasing dose of Effexor to reduce side effects.  She agrees with the plan.  Visit Diagnosis:    ICD-10-CM   1. Posttraumatic stress disorder  F43.10     2. Severe episode of recurrent major depressive disorder, without psychotic features ([place])  F33.2     3. Anxiety  F41.9       Past Psychiatric History: Previous Psych Diagnoses: PTSD, MDD, anxiety, BPD.  Prior inpatient treatment: In 2022, she was admitted to [facility] UC observation area for couple of days Current meds: Effexor XR to 25 mg,  Klonopin 1 mg OD as needed for anxiety, hydroxyzine 25 mg 3 times daily as needed for anxiety.  Patient follows Dr. Nelida Gores at [facility] clinic Psychotherapy hx: Was getting therapy from [facility] health every other week. Previous suicidal attempts: At 14, she was going to inject air in her veins but her mom walked in the room.  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   PTSD (post-traumatic stress disorder)    Severe uncontrolled hypertension 10/08/2017   UTI (urinary tract infection)     Past Surgical History:  Procedure Laterality Date   APPENDECTOMY     BIOPSY  05/10/2020   Procedure: BIOPSY;  Surgeon: Arta Silence, MD;  Location: [facility];  Service: Endoscopy;;   CESAREAN SECTION N/A 12/06/2017   Procedure: CESAREAN SECTION;  Surgeon: Sloan Leiter, MD;  Location: [facility];  Service: Obstetrics;  Laterality: N/A;   CESAREAN SECTION  07/21/2021   Procedure: CESAREAN SECTION;  Surgeon: Donnamae Jude, MD;  Location: [facility];  Service: Obstetrics;;   CHOLECYSTECTOMY N/A 05/11/2020   Procedure: SINGLE SITE LAPAROSCOPIC CHOLECYSTECTOMY AND LIVER BIOSY;  Surgeon: Michael Boston, MD;  Location: [facility];  Service: General;  Laterality: N/A;   ESOPHAGOGASTRODUODENOSCOPY (EGD) WITH PROPOFOL N/A 05/10/2020   Procedure: ESOPHAGOGASTRODUODENOSCOPY (EGD) WITH PROPOFOL;  Surgeon: Arta Silence, MD;  Location: [facility];  Service: Endoscopy;  Laterality: N/A;   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   HAND RECONSTRUCTION Right    INCISION AND DRAINAGE     NASAL SEPTUM SURGERY      Family Psychiatric History: Psych: Sister-depression, PTSD SA/HA: Denies    Family History:  Family History  Problem Relation Age of Onset   Hypertension Mother    Hypertension Father     Social History:  Social History   Socioeconomic History   Marital status: Married    Spouse name: Not on file   Number of children: 3   Years of  education: Not on file   Highest education level: 8th grade  Occupational History   Not on file  Tobacco Use   Smoking status: Never   Smokeless tobacco: Never   Tobacco comments:    She tried a cigarette and never smoked again  Vaping Use   Vaping Use: Never used  Substance and Sexual Activity   Alcohol use: Yes    Comment: bottle of wine a week-occ   Drug use: No   Sexual activity: Not Currently  Other Topics Concern   Not on file  Social History Narrative   Not on file   Social Determinants of Health   Financial Resource Strain: Not on file  Food Insecurity: No Food Insecurity (06/13/2021)   Hunger Vital Sign    Worried About Running Out of Food in the Last Year: Never true    Ran Out of Food in the Last Year: Never true  Transportation Needs: Unmet Transportation Needs (06/13/2021)  PRAPARE - Hydrologist (Medical): Yes    Lack of Transportation (Non-Medical): Yes  Physical Activity: Not on file  Stress: Not on file  Social Connections: Not on file    Allergies:  Allergies  Allergen Reactions   Red Dye Other (See Comments)    ALL CAUSE PROBLEMS #40 IS WORST-MIGRAINES    Compazine [Prochlorperazine Maleate] Other (See Comments)    TWITCHING, CANT STAY STILL. Pt states she can tolerate promethazine   Other Other (See Comments)    FANSIDAR FOR MALARIA-ASTHMA   Vicodin [Hydrocodone-Acetaminophen] Itching, Nausea And Vomiting and Other (See Comments)    Pt states she can tolerate acetaminophen   Amitriptyline Hcl Other (See Comments)    Angry moods   Duloxetine Hcl Other (See Comments)    Passing out, trimble, irritable   Lisinopril Cough   Reglan [Metoclopramide] Other (See Comments)    restless   Nifedipine Nausea And Vomiting and Other (See Comments)    Discussed with patient and maternal fetal medicine, patient has intolerance but not frank allergy.    Metabolic Disorder Labs: Lab Results  Component Value Date   HGBA1C 5.1  04/01/2021   No results found for: "PROLACTIN" No results found for: "CHOL", "TRIG", "HDL", "CHOLHDL", "VLDL", "LDLCALC" Lab Results  Component Value Date   TSH 2.154 07/03/2021   TSH 2.178 07/02/2021    Therapeutic Level Labs: No results found for: "LITHIUM" No results found for: "VALPROATE" No results found for: "CBMZ"  Current Medications: Current Outpatient Medications  Medication Sig Dispense Refill   acetaminophen (TYLENOL) 500 MG tablet Take 2 tablets (1,000 mg total) by mouth every 6 (six) hours as needed. 30 tablet 0   albuterol (PROVENTIL HFA;VENTOLIN HFA) 108 (90 Base) MCG/ACT inhaler Inhale 2 puffs into the lungs every 6 (six) hours as needed for wheezing or shortness of breath.     amLODipine (NORVASC) 10 MG tablet Take 1 tablet (10 mg total) by mouth daily. 90 tablet 2   carvedilol (COREG) 25 MG tablet Take 1 tablet (25 mg total) by mouth 2 (two) times daily. 180 tablet 3   cetirizine (ZYRTEC ALLERGY) 10 MG tablet Take 1 tablet (10 mg total) by mouth 2 (two) times daily. 60 tablet 5   clonazePAM (KLONOPIN) 1 MG tablet Take 1 tablet (1 mg total) by mouth 2 (two) times daily as needed for anxiety. 30 tablet 0   EPINEPHrine 0.3 mg/0.3 mL IJ SOAJ injection Inject 0.3 mg into the muscle as needed for anaphylaxis. 1 each 2   famotidine (PEPCID) 20 MG tablet Take 20 mg by mouth 2 (two) times daily.     hydrALAZINE (APRESOLINE) 100 MG tablet Take 1 tablet (100 mg total) by mouth 3 (three) times daily. 90 tablet 1   hydrOXYzine (ATARAX) 25 MG tablet Take 1 tablet (25 mg total) by mouth 3 (three) times daily as needed. 90 tablet 0   ibuprofen (ADVIL) 600 MG tablet Take 1 tablet (600 mg total) by mouth every 6 (six) hours as needed for fever, headache or moderate pain. 40 tablet 0   levothyroxine (SYNTHROID) 125 MCG tablet Take 1 tablet (125 mcg total) by mouth daily before breakfast. 45 tablet 3   Prenatal Multivit-Min-Fe-FA (PRE-NATAL PO) Take 1 tablet by mouth daily.      spironolactone (ALDACTONE) 25 MG tablet Take 1 tablet (25 mg total) by mouth daily. 90 tablet 3   venlafaxine XR (EFFEXOR-XR) 150 MG 24 hr capsule Take 2 capsules (300 mg total) by  mouth daily. 60 capsule 0   No current facility-administered medications for this visit.     Musculoskeletal: Strength & Muscle Tone: Not able to assess due to virtual visit Gait & Station: Not well to assess due to virtual visit Patient leans: N/A  Psychiatric Specialty Exam: Review of Systems  unknown if currently breastfeeding.There is no height or weight on file to calculate BMI.  General Appearance: Casual  Eye Contact:  Fair  Speech:  Clear and Coherent and Normal Rate  Volume:  Normal  Mood:  Anxious and Depressed  Affect:  Depressed and Tearful  Thought Process:  Coherent and Linear  Orientation:  Full (Time, Place, and Person)  Thought Content: Paranoid Ideation   Suicidal Thoughts:  No  Homicidal Thoughts:  No  Memory:  Immediate;   Good Recent;   Good  Judgement:  Good  Insight:  Good  Psychomotor Activity:  Normal  Concentration:  Concentration: Good and Attention Span: Good  Recall:  Good  Fund of Knowledge: Good  Language: Good  Akathisia:  No  Handed:  Right  AIMS (if indicated): not done  Assets:  Communication Skills Desire for Improvement Housing Resilience Social Support  ADL's:  Intact  Cognition: WNL  Sleep:  Good   Screenings: GAD-7    Flowsheet Row Routine Prenatal from 06/13/2021 in [facility] for [facility] Routine Prenatal from 05/23/2021 in [facility] Initial Prenatal from 04/01/2021 in [facility] Office Visit from 10/09/2020 in [place] for [facility] Office Visit from 09/26/2020 in [place] for [facility]  Total GAD-7 Score '11 13 6 17 20       '$ PHQ2-9    Flowsheet Row Counselor from 11/11/2021 in [facility] Counselor from 11/05/2021 in [facility] Office Visit from 10/21/2021 in [facility] Routine Prenatal from 06/13/2021 in [facility] Routine Prenatal from 05/23/2021 in [facility] for [place] [facility]  PHQ-2 Total Score '6 6 4 2 2  '$ PHQ-9 Total Score '25 25 20 10 12      '$ Flowsheet Row Counselor from 11/11/2021 in [facility] Counselor from 11/05/2021 in [facility] Office Visit from 10/21/2021 in [facility]  C-SSRS RISK CATEGORY Error: Question 6 not populated Low Risk No Risk        Assessment and Plan:  Patient is a 31 year old female with past psychiatric history of PTSD, MDD, anxiety, and BPD and medical history of Hashimoto thyroiditis, asthma, migraine, hypertension see virtually in [facility] [place] [facility] program for follow up. Pt joined [place] program on 11/11/2021.  Patient is reporting worsening of her depression and anxiety.  She reports tiredness and paranoia.  Will reduce Effexor dose and change from daily to nightly to reduce tiredness during daytime and add Seroquel to help with mood stabilization.  MDD, Severe, recurrent PTSD BPD Anxiety  Plan: -Decrease Effexor XR to 225 mg (from daily to nightly to reduce tiredness during daytime) -Continue Atarax 25 mg TID as needed for anxiety. -Start Seroquel 25 mg nightly to help with mood stabilization. -Continue Klonopin 1 mg BID as needed for anxiety.   - Follow up-next week or as needed.   Collaboration of Care: Collaboration of  Care: Other Dr Dwyane Dee and [facility] team  Patient/Guardian was advised Release of Information must be obtained prior to any record release in order to collaborate their care with an outside  provider. Patient/Guardian was advised if they have not already done so to contact the registration department to sign all necessary forms in order for [place] to release information regarding their care.   Consent: Patient/Guardian gives verbal consent for treatment and assignment of benefits for services provided during this visit. Patient/Guardian expressed understanding and agreed to proceed.    Armando Reichert, MD PGY3 11/25/2021, 10:45 AM

## 2021-11-26 ENCOUNTER — Encounter (HOSPITAL_COMMUNITY): Payer: Self-pay | Admitting: Psychiatry

## 2021-11-26 ENCOUNTER — Ambulatory Visit (HOSPITAL_COMMUNITY): Payer: Medicaid Other

## 2021-11-26 ENCOUNTER — Ambulatory Visit: Payer: Medicaid Other | Admitting: Physical Therapy

## 2021-11-27 ENCOUNTER — Ambulatory Visit (HOSPITAL_COMMUNITY): Payer: Medicaid Other

## 2021-11-27 ENCOUNTER — Ambulatory Visit (HOSPITAL_COMMUNITY): Payer: Commercial Managed Care - HMO | Admitting: Psychiatry

## 2021-11-27 ENCOUNTER — Ambulatory Visit (HOSPITAL_COMMUNITY): Payer: Medicaid Other | Admitting: Psychiatry

## 2021-11-28 ENCOUNTER — Ambulatory Visit (HOSPITAL_COMMUNITY): Payer: Medicaid Other

## 2021-11-28 ENCOUNTER — Ambulatory Visit: Payer: Self-pay

## 2021-11-28 NOTE — Lactation Note (Signed)
This note was copied from a baby's chart.  NICU Lactation Consultation Note  Patient Name: Krystal [name] Today's Date: 11/28/2021 Age:33 m.o.  Subjective Reason for consult: Follow-up assessment; Other (Comment); NICU baby; Term (Telephone call)  [place] in the room to visit with Ms. Cliburn, but GOB was the one visiting, she voiced that Ms. Harshbarger was in [place]; [place] voiced she could be reached over the phone. Spoke to MOB over the phone and she voiced her supply is still on the lower end; she continues to pump every 3-4 hours and also power pumps if she skips pumping sessions. She noticed that her supply decreased even more three weeks ago when she got her period, it went from 2 oz to 1 oz. She said she's been "spotting" the entire time since baby's birth but the bleeding (and drop in her supply) were consistent with getting her menses back on. Explained the importance of consistent pumping during this time, even if her supply drops, and that hopefully the drop will be temporary (it dig get back to 2 oz afterwards). Ms. Rolland understands the benefits of keeping baby in breastmilk even if we have to supplement with formula; we agreed that her priority at this time is her mental health. She also mentioned that if it were necessary, she has a friend who has volunteered to donate her milk. Let her know that lactation will consult with NICU staff to see if this option is feasible.   Objective Infant data: Mother's Current Feeding Choice: Breast Milk and Formula  Infant feeding assessment Scale for Readiness: 2 (gave paci but would not take)  Maternal data: [id number]  C-Section, Low Transverse Pumping frequency: every 3-4 hours or longer sometimes Pumped volume: 60 mL Flange Size: 27 WIC Program: Yes WIC Referral Sent?: Yes Pump: Personal (WIC pump and Zomie Z2 since 10/19/21)  Assessment Infant: In NICU  Maternal: Milk volume: Low  Intervention/Plan Interventions: Breast feeding  basics reviewed; Education Tools: Pump; [place]anges Pump Education: Setup, frequency, and cleaning; Milk Storage  Plan of care: Encouraged to continue pumping consistently every 3-4 hours or at her own pace if pumping becomes too stressful  She'll continue power pumping on a daily basis She'll continue working on STS care once she gets back from [place] present, she was holding baby [place]. All questions and concerns answered, family to contact [facility] services PRN.  Consult Status: NICU follow-up  NICU Follow-up type: Weekly NICU follow up   [place] 11/28/2021, 4:18 PM

## 2021-11-29 ENCOUNTER — Ambulatory Visit (HOSPITAL_COMMUNITY): Payer: Medicaid Other

## 2021-12-02 ENCOUNTER — Ambulatory Visit (HOSPITAL_COMMUNITY): Payer: Medicaid Other

## 2021-12-03 ENCOUNTER — Ambulatory Visit (HOSPITAL_COMMUNITY): Payer: Medicaid Other

## 2021-12-04 ENCOUNTER — Other Ambulatory Visit: Payer: Self-pay

## 2021-12-04 ENCOUNTER — Ambulatory Visit (HOSPITAL_COMMUNITY): Payer: Medicaid Other

## 2021-12-04 ENCOUNTER — Telehealth (HOSPITAL_COMMUNITY): Payer: Self-pay | Admitting: Licensed Clinical Social Worker

## 2021-12-04 ENCOUNTER — Encounter: Payer: Self-pay | Admitting: Allergy

## 2021-12-04 ENCOUNTER — Telehealth (HOSPITAL_COMMUNITY): Payer: Self-pay | Admitting: *Deleted

## 2021-12-04 ENCOUNTER — Ambulatory Visit (INDEPENDENT_AMBULATORY_CARE_PROVIDER_SITE_OTHER): Payer: Medicaid Other | Admitting: Allergy

## 2021-12-04 ENCOUNTER — Ambulatory Visit: Payer: Self-pay

## 2021-12-04 VITALS — HR 76 | Temp 98.0°F | Resp 16 | Ht 71.0 in | Wt 254.8 lb

## 2021-12-04 DIAGNOSIS — L508 Other urticaria: Secondary | ICD-10-CM

## 2021-12-04 DIAGNOSIS — T63481A Toxic effect of venom of other arthropod, accidental (unintentional), initial encounter: Secondary | ICD-10-CM

## 2021-12-04 DIAGNOSIS — J3089 Other allergic rhinitis: Secondary | ICD-10-CM

## 2021-12-04 DIAGNOSIS — J4541 Moderate persistent asthma with (acute) exacerbation: Secondary | ICD-10-CM | POA: Diagnosis not present

## 2021-12-04 MED ORDER — ALBUTEROL SULFATE HFA 108 (90 BASE) MCG/ACT IN AERS: 2.0000 | INHALATION_SPRAY | RESPIRATORY_TRACT | 1 refills | Status: DC | PRN

## 2021-12-04 MED ORDER — FAMOTIDINE 20 MG PO TABS
20.0000 mg | ORAL_TABLET | Freq: Two times a day (BID) | ORAL | 5 refills | Status: DC
Start: 2021-12-04 — End: 2023-03-25

## 2021-12-04 MED ORDER — AMOXICILLIN-POT CLAVULANATE 875-125 MG PO TABS
1.0000 | ORAL_TABLET | Freq: Two times a day (BID) | ORAL | 0 refills | Status: DC
Start: 2021-12-04 — End: 2022-03-05

## 2021-12-04 NOTE — Patient Instructions (Addendum)
Sinusitis/asthma flare: Prednisone '10mg'$  tablet pack: 2 tablets given in office today. Take 2 more tablets before bed today.  Then take 2 tablets twice a day for 2 more days. Then take 2 tablets once a day for 1 day. Then take 1 tablet once a day for 1 day.   If not doing better by Friday then start antibiotics.  Augmentin '875mg'$  twice a day x 10 days.   Start Ryaltris (olopatadine + mometasone nasal spray combination) 1-2 sprays per nostril twice a day.  Nasal saline spray (i.e., Simply Saline) or nasal saline lavage (i.e., NeilMed) is recommended as needed and prior to medicated nasal sprays.  Hives/swelling:  Continue zyrtec (cetirizine) '10mg'$  twice a day. If symptoms are not controlled or causes drowsiness let us know. Continue Pepcid (famotidine) '20mg'$  twice a day.  May take benadryl '25mg'$  to '50mg'$  every 4-6 hours additionally as needed for flares. Avoid the following potential triggers: alcohol, tight clothing, NSAIDs, hot showers and getting overheated.  Environmental allergies: 2022 skin testing was positive to cat.  Continue environmental control measures. Use over the counter antihistamines such as Zyrtec (cetirizine), Claritin (loratadine), Allegra (fexofenadine), or Xyzal (levocetirizine) daily as needed. May take twice a day during allergy flares. May switch antihistamines every few months.  Asthma: Daily controller medication(s): start Symbicort 42mg 2 puffs twice a day with spacer and rinse mouth afterwards. May use albuterol rescue inhaler 2 puffs every 4 to 6 hours as needed for shortness of breath, chest tightness, coughing, and wheezing. May use albuterol rescue inhaler 2 puffs 5 to 15 minutes prior to strenuous physical activities. Monitor frequency of use.  Asthma control goals:  Full participation in all desired activities (may need albuterol before activity) Albuterol use two times or less a week on average (not counting use with activity) Cough interfering with sleep  two times or less a month Oral steroids no more than once a year No hospitalizations  Bee stings: Continue to avoid.  Follow up in 2 months or sooner if needed.  Get flu shot when feeling better.  Think about getting the Covid-19 vaccinations.

## 2021-12-04 NOTE — Assessment & Plan Note (Signed)
Past history - Localized reaction to bee stings with possible shortness of breath. Negative hymenoptera panel . Continue to avoid.

## 2021-12-04 NOTE — Telephone Encounter (Signed)
In CovermyMeds its asking for a PA for her Quetiapine. Called her preferred pharmacy to check to see if it was still required as they did not send me the request and she has mcd. [facility] notes the rx was transferred to a pharmacy in [place] on 11/26/21. I reviewed the drs most recent note and it makes mention she is going to [place] for some time. I can not do a PA for [place] and it can be overriden thru MCD three times.

## 2021-12-04 NOTE — Assessment & Plan Note (Signed)
Past history - Breaking out in whole body hives a few times per week with associated periorbital/lip swelling for the past 2-3 months - sometimes has trouble breathing. No triggers noted. Spouse concerned about cat allergies - 1 cat at home x 6+ years. Tried antihistamines with some benefit. 1 course of prednisone. Bloodwork (CBC diff, CMP, TSH, ESR, CRP, tryptase, Alpha gal) all normal. CU elevated, positive ANA with elevated RNP antibody - referral for rheumatology placed in December 2022.  Interim history - well controlled but still have hives sometimes.   Continue zyrtec (cetirizine) 64m twice a day.  If symptoms are not controlled or causes drowsiness let [place].  Continue Pepcid (famotidine) 274mtwice a day.   May take benadryl 2569mo 24m29mery 4-6 hours additionally as needed for flares. . Avoid the following potential triggers: alcohol, tight clothing, NSAIDs, hot showers and getting overheated.

## 2021-12-04 NOTE — Assessment & Plan Note (Signed)
Past history - Usually flares in the spring and uses albuterol daily then. No prior maintenance inhalers. 2022 spirometry was normal.  Interim history - symptoms flared. Last prednisone use was in June 2023.  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 Clinically feeling improved.   Start prednisone taper.  . Daily controller medication(s): start Symbicort 69mg 2 puffs twice a day with spacer and rinse mouth afterwards. . May use albuterol rescue inhaler 2 puffs every 4 to 6 hours as needed for shortness of breath, chest tightness, coughing, and wheezing. May use albuterol rescue inhaler 2 puffs 5 to 15 minutes prior to strenuous physical activities. Monitor frequency of use.   Get flu shot when feeling better.   Recommend Covid-19 vaccinations as well.  Get spirometry at next visit.

## 2021-12-04 NOTE — Lactation Note (Signed)
This note was copied from a baby's chart.  NICU Lactation Consultation Note  Patient Name: Krystal [name] Today's Date: 12/04/2021 Age:33 m.o.   Subjective Reason for consult: Follow-up assessment Mother continues to pump and challenge infant at breast. She attempted to latch during my visit. Infant demonstrated initial interest and then became fussy. She did not latch during my visit.  Objective Infant data: Mother's Current Feeding Choice: Breast Milk and Formula  Infant feeding assessment Scale for Readiness: 3     Maternal data: [id number]  C-Section, Low Transverse  Pumping frequency: q4h Pumped volume: 60 mL   WIC Program: Yes WIC Referral Sent?: Yes Pump: Personal (WIC pump and Zomie Z2 since 10/19/21)  Assessment Maternal: Milk volume: Low   Intervention/Plan Interventions: Education  Plan: Consult Status: NICU follow-up  NICU Follow-up type: Weekly NICU follow up    Gwynne Edinger 12/04/2021, 10:46 AM

## 2021-12-04 NOTE — Progress Notes (Signed)
Follow Up Note  RE: Krystal [name] MRN: [id number] DOB: [birth year] Date of Office Visit: 12/04/2021  Referring provider: Audley Hose, MD Primary care provider: Patient, No Pcp Per  Chief Complaint: Asthma (Difficulty breathing in the evening ), Sinus Problem (Sinus is sore causing teeth sensitivity ), and Cough (Constant cough )  History of Present Illness: I had the pleasure of seeing Krystal [name] for a follow up visit at the [facility] [place] on 12/04/2021. She is a 31 y.o. female, who is being followed for chronic urticaria, allergic rhino conjunctivitis, asthma. Her previous allergy office visit was on 08/12/2021 with Dr. Maudie Mercury. Today is a new complaint visit of breathing and sinus issues .  She noted some increased dry cough about 2 days ago. Having some chest tightness and shortness of breath.  No fevers/chills.  Patient was in [place] last week.  Patient started Symbicort 77mg 2 puffs twice a day and used albuterol prn.  No additional prednisone since the last visit.  Some nasal congestion and rhinorrhea. Currently taking zyrtec 136mBID.  Doing saline rinses.  No additional antibiotics since the last visit.  Chronic urticaria Well controlled but yesterday had a few bumps on the face. Still taking famotidine 2040mID, zyrtec 74m35mD.  Baby is still in the NICU but off oxygen. She is getting g tube and cardiac surgery in the future.   Assessment and Plan: Krystal [name] 31 y22. female with: Moderate persistent asthma with acute exacerbation Past history - Usually flares in the spring and uses albuterol daily then. No prior maintenance inhalers. 2022 spirometry was normal.  Interim history - symptoms flared. Last prednisone use was in June 2023. 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 Clinically feeling improved.  Start prednisone taper.  Daily  controller medication(s): start Symbicort 80mc2mpuffs twice a day with spacer and rinse mouth afterwards. May use albuterol rescue inhaler 2 puffs every 4 to 6 hours as needed for shortness of breath, chest tightness, coughing, and wheezing. May use albuterol rescue inhaler 2 puffs 5 to 15 minutes prior to strenuous physical activities. Monitor frequency of use.  Get flu shot when feeling better.  Recommend Covid-19 vaccinations as well. Get spirometry at next visit.  Perennial allergic rhinitis Past history - Rhino conjunctivitis symptoms mainly in the spring and fall and around close cat contact. Deviated septum and nasal polypectomy surgery in the past.1 cat at home. 2022 skin prick testing was only positive to cats. 2022 bloodwork positive to cat and dog.  Interim history - symptoms flared. Last antibiotics in June 2023. If not doing better by Friday then start antibiotics.  Augmentin 875mg 74me a day x 10 days.  Start Ryaltris (olopatadine + mometasone nasal spray combination) 1-2 sprays per nostril twice a day.  Nasal saline spray (i.e., Simply Saline) or nasal saline lavage (i.e., NeilMed) is recommended as needed and prior to medicated nasal sprays. Continue environmental control measures. Use over the counter antihistamines such as Zyrtec (cetirizine), Claritin (loratadine), Allegra (fexofenadine), or Xyzal (levocetirizine) daily as needed. May take twice a day during allergy flares. May switch antihistamines every few months.  Chronic urticaria Past history - Breaking out in whole body hives a few times per week with associated periorbital/lip swelling for the past 2-3 months - sometimes has trouble breathing. No triggers noted. Spouse concerned about cat allergies - 1 cat at home x 6+ years. Tried antihistamines with  some benefit. 1 course of prednisone. Bloodwork (CBC diff, CMP, TSH, ESR, CRP, tryptase, Alpha gal) all normal. CU elevated, positive ANA with elevated RNP antibody -  referral for rheumatology placed in December 2022.  Interim history - well controlled but still have hives sometimes.  Continue zyrtec (cetirizine) 75m twice a day. If symptoms are not controlled or causes drowsiness let [place]. Continue Pepcid (famotidine) 223mtwice a day.  May take benadryl 2567mo 21m4mery 4-6 hours additionally as needed for flares. Avoid the following potential triggers: alcohol, tight clothing, NSAIDs, hot showers and getting overheated.  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 Negative hymenoptera panel Continue to avoid.  Return in about 2 months (around 02/03/2022).  Meds ordered this encounter  Medications   famotidine (PEPCID) 20 MG tablet    Sig: Take 1 tablet (20 mg total) by mouth 2 (two) times daily.    Dispense:  60 tablet    Refill:  5   albuterol (VENTOLIN HFA) 108 (90 Base) MCG/ACT inhaler    Sig: Inhale 2 puffs into the lungs every 4 (four) hours as needed for wheezing or shortness of breath (coughing fits).    Dispense:  18 g    Refill:  1   amoxicillin-clavulanate (AUGMENTIN) 875-125 MG tablet    Sig: Take 1 tablet by mouth 2 (two) times daily.    Dispense:  20 tablet    Refill:  0   Lab Orders  No laboratory test(s) ordered today    Diagnostics: Spirometry:  Tracings reviewed. Her effort: Good reproducible efforts. FVC: 2.83L FEV1: 1.89L, 49% predicted FEV1/FVC ratio: 67% Interpretation: Spirometry consistent with mixed obstructive and restrictive disease with 39% improvement in FEV1 post bronchodilator treatment. Clinically feeling improved.   Please see scanned spirometry results for details.  Medication List:  Current Outpatient Medications  Medication Sig Dispense Refill   acetaminophen (TYLENOL) 500 MG tablet Take 2 tablets (1,000 mg total) by mouth every 6 (six) hours as needed. 30 tablet 0   albuterol (VENTOLIN HFA) 108 (90 Base) MCG/ACT  inhaler Inhale 2 puffs into the lungs every 4 (four) hours as needed for wheezing or shortness of breath (coughing fits). 18 g 1   amLODipine (NORVASC) 10 MG tablet Take 1 tablet (10 mg total) by mouth daily. 90 tablet 2   amoxicillin-clavulanate (AUGMENTIN) 875-125 MG tablet Take 1 tablet by mouth 2 (two) times daily. 20 tablet 0   carvedilol (COREG) 25 MG tablet Take 1 tablet (25 mg total) by mouth 2 (two) times daily. 180 tablet 3   cetirizine (ZYRTEC ALLERGY) 10 MG tablet Take 1 tablet (10 mg total) by mouth 2 (two) times daily. 60 tablet 5   clonazePAM (KLONOPIN) 1 MG tablet Take 1 tablet (1 mg total) by mouth 2 (two) times daily as needed for anxiety. 30 tablet 0   EPINEPHrine 0.3 mg/0.3 mL IJ SOAJ injection Inject 0.3 mg into the muscle as needed for anaphylaxis. 1 each 2   hydrALAZINE (APRESOLINE) 100 MG tablet Take 1 tablet (100 mg total) by mouth 3 (three) times daily. 90 tablet 1   ibuprofen (ADVIL) 600 MG tablet Take 1 tablet (600 mg total) by mouth every 6 (six) hours as needed for fever, headache or moderate pain. 40 tablet 0   levothyroxine (SYNTHROID) 125 MCG tablet Take 1 tablet (125 mcg total) by mouth daily before breakfast. 45 tablet 3   Prenatal Multivit-Min-Fe-FA (PRE-NATAL PO) Take 1  tablet by mouth daily.     QUEtiapine (SEROQUEL) 25 MG tablet Take 1 tablet (25 mg total) by mouth at bedtime. 30 tablet 0   spironolactone (ALDACTONE) 25 MG tablet Take 1 tablet (25 mg total) by mouth daily. 90 tablet 3   venlafaxine XR (EFFEXOR-XR) 75 MG 24 hr capsule Take 3 capsules (225 mg total) by mouth daily. 90 capsule 0   famotidine (PEPCID) 20 MG tablet Take 1 tablet (20 mg total) by mouth 2 (two) times daily. 60 tablet 5   No current facility-administered medications for this visit.   Allergies: Allergies  Allergen Reactions   Red Dye Other (See Comments)    ALL CAUSE PROBLEMS #40 IS WORST-MIGRAINES    Compazine [Prochlorperazine Maleate] Other (See Comments)    TWITCHING, CANT  STAY STILL. Pt states she can tolerate promethazine   Other Other (See Comments)    FANSIDAR FOR MALARIA-ASTHMA   Vicodin [Hydrocodone-Acetaminophen] Itching, Nausea And Vomiting and Other (See Comments)    Pt states she can tolerate acetaminophen   Amitriptyline Hcl Other (See Comments)    Angry moods   Duloxetine Hcl Other (See Comments)    Passing out, trimble, irritable   Lisinopril Cough   Reglan [Metoclopramide] Other (See Comments)    restless   Nifedipine Nausea And Vomiting and Other (See Comments)    Discussed with patient and maternal fetal medicine, patient has intolerance but not frank allergy.   I reviewed her past medical history, social history, family history, and environmental history and no significant changes have been reported from her previous visit.  Review of Systems  Constitutional:  Negative for appetite change, chills, fever and unexpected weight change.  HENT:  Positive for sinus pressure and sinus pain. Negative for congestion and rhinorrhea.   Eyes:  Negative for itching.  Respiratory:  Positive for cough, chest tightness and shortness of breath. Negative for wheezing.   Cardiovascular:  Negative for chest pain.  Gastrointestinal:  Negative for abdominal pain.  Genitourinary:  Negative for difficulty urinating.  Skin:  Positive for rash.  Allergic/Immunologic: Positive for environmental allergies. Negative for food allergies.  Neurological:  Positive for headaches.    Objective: Pulse 76   Temp 98 F (36.7 C)   Resp 16   Ht _0  (1.803 m)   Wt 254 lb 12.8 oz (115.6 kg)   SpO2 96%   BMI 35.54 kg/m  Body mass index is 35.54 kg/m. Physical Exam Vitals and nursing note reviewed.  Constitutional:      Appearance: Normal appearance. She is well-developed.  HENT:     Head: Normocephalic and atraumatic.     Right Ear: Tympanic membrane and external ear normal.     Left Ear: Tympanic membrane and external ear normal.     Nose: Nose normal.      Mouth/Throat:     Mouth: Mucous membranes are moist.     Pharynx: Oropharynx is clear.  Eyes:     Conjunctiva/sclera: Conjunctivae normal.  Cardiovascular:     Rate and Rhythm: Normal rate and regular rhythm.     Heart sounds: Normal heart sounds. No murmur heard. Pulmonary:     Effort: Pulmonary effort is normal.     Breath sounds: No wheezing, rhonchi or rales.     Comments: Decreased breath sounds throughout. Musculoskeletal:     Cervical back: Neck supple.  Skin:    General: Skin is warm.     Findings: No rash.  Neurological:     Mental Status:  She is alert and oriented to person, place, and time.  Psychiatric:        Behavior: Behavior normal.   Previous notes and tests were reviewed. The plan was reviewed with the patient/family, and all questions/concerned were addressed.  It was my pleasure to see [place] today and participate in her care. Please feel free to contact me with any questions or concerns.  Sincerely,  Rexene Alberts, DO Allergy & Immunology  [facility] office: [place] office: [phone]

## 2021-12-04 NOTE — Assessment & Plan Note (Signed)
Past history - Rhino conjunctivitis symptoms mainly in the spring and fall and around close cat contact. Deviated septum and nasal polypectomy surgery in the past.1 cat at home. 2022 skin prick testing was only positive to cats. 2022 bloodwork positive to cat and dog.  Interim history - symptoms flared. Last antibiotics in June 2023.  If not doing better by Friday then start antibiotics.  o Augmentin '875mg'$  twice a day x 10 days.  . Start Ryaltris (olopatadine + mometasone nasal spray combination) 1-2 sprays per nostril twice a day.  . Nasal saline spray (i.e., Simply Saline) or nasal saline lavage (i.e., NeilMed) is recommended as needed and prior to medicated nasal sprays.  Continue environmental control measures.  Use over the counter antihistamines such as Zyrtec (cetirizine), Claritin (loratadine), Allegra (fexofenadine), or Xyzal (levocetirizine) daily as needed. May take twice a day during allergy flares. May switch antihistamines every few months.

## 2021-12-05 ENCOUNTER — Ambulatory Visit (HOSPITAL_COMMUNITY): Payer: Medicaid Other

## 2021-12-05 ENCOUNTER — Encounter (HOSPITAL_COMMUNITY): Payer: Self-pay | Admitting: Psychiatry

## 2021-12-05 ENCOUNTER — Telehealth (HOSPITAL_BASED_OUTPATIENT_CLINIC_OR_DEPARTMENT_OTHER): Payer: Medicaid Other | Admitting: Psychiatry

## 2021-12-05 DIAGNOSIS — F4310 Post-traumatic stress disorder, unspecified: Secondary | ICD-10-CM | POA: Diagnosis not present

## 2021-12-05 DIAGNOSIS — F419 Anxiety disorder, unspecified: Secondary | ICD-10-CM

## 2021-12-05 DIAGNOSIS — F603 Borderline personality disorder: Secondary | ICD-10-CM

## 2021-12-05 DIAGNOSIS — F332 Major depressive disorder, recurrent severe without psychotic features: Secondary | ICD-10-CM

## 2021-12-05 DIAGNOSIS — M797 Fibromyalgia: Secondary | ICD-10-CM

## 2021-12-05 MED ORDER — PROPRANOLOL HCL 10 MG PO TABS: 10.0000 mg | ORAL_TABLET | Freq: Two times a day (BID) | ORAL | 1 refills | Status: DC | PRN
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
## 2021-12-06 ENCOUNTER — Ambulatory Visit (HOSPITAL_COMMUNITY): Payer: Medicaid Other

## 2021-12-08 ENCOUNTER — Ambulatory Visit: Payer: Self-pay

## 2021-12-08 NOTE — Lactation Note (Signed)
This note was copied from a Krystal's chart. Lactation Consultation Note  Patient Name: Krystal [name] Today's Date: 12/08/2021 Reason for consult: Follow-up assessment;NICU Krystal Age:33 m.o.  Visited with family of 32 18/36 weeks old (adjusted) NICU female, Krystal [name] is a P3 and experienced breastfeeding. NICU RN Krystal [name] called for latch assistance, Krystal just had her bath and did this feeding swaddled. Took Krystal "Krystal [name]" to mother's L breast in cross cradle hold and she latched on right away but the latch was shallow, and kept unlatching, when trying to do suck training because started fussing; d/c suck training immediately, due to Krystal's Hx of oral aversion. Krystal will take the breast and self pace but she was having challenges sustaining a deep latch for optimal milk transfer (see LATCH score). Maternal supply has lessened this week due to her having her period. Krystal [name] understands that even if Krystal [name] starts taking the breast regularly, she would still need to supplement due to her supply; she was agreeable with it. She inquired about doing a weighted feed, Krystal [name] is scheduled to have a GI tube placement on 12/11/21, she'd like to have the pre/post-weighted feed prior that date; let her know our schedule availability to call for assistance when needed.  Feeding Mother's Current Feeding Choice: Breast Milk and Formula  LATCH Score Latch: Repeated attempts needed to sustain latch, nipple held in mouth throughout feeding, stimulation needed to elicit sucking reflex. (Latched with ease but required constant repositioning due to Krystal having a shallow latch)  Audible Swallowing: A few with stimulation (Krystal [name] also voiced her L side (where Krystal nursed first) felt "empty" after this feeding)  Type of Nipple: Everted at rest and after stimulation  Comfort (Breast/Nipple): Soft / non-tender  Hold (Positioning): Assistance needed to correctly position infant at breast and  maintain latch. (She's an experienced breastfeeder, but required assistance with latching and positioning to hold Krystal comfortably)  LATCH Score: 7  Lactation Tools Discussed/Used Tools: Pump;Flanges Flange Size: 27 Breast pump type: Double-Electric Breast Pump Pump Education: Setup, frequency, and cleaning;Milk Storage Reason for Pumping: NICU Pumping frequency: 5 times/24 hours Pumped volume: 45 mL (supply dropped this week due to having her period)  Interventions Interventions: Breast feeding basics reviewed;Assisted with latch;Adjust position;Support pillows;Breast compression;DEBP;Education  Plan of care Encouraged to continue pumping consistently every 3-4 hours or at her own pace if pumping becomes too stressful  She'll continue power pumping on a daily basis She'll continue putting Krystal to breast on feeding cues around feeding times   Big 4 y.o sister present and "helping" with the feeding. All questions and concerns answered, family to contact [facility] services PRN.  Consult Status Consult Status: NICU follow-up Date: 12/08/21 Follow-up type: In-patient   Sharelle Burditt Francene Boyers 12/08/2021, 5:45 PM

## 2021-12-10 ENCOUNTER — Ambulatory Visit (INDEPENDENT_AMBULATORY_CARE_PROVIDER_SITE_OTHER): Payer: Medicaid Other | Admitting: Licensed Clinical Social Worker

## 2021-12-10 DIAGNOSIS — F4310 Post-traumatic stress disorder, unspecified: Secondary | ICD-10-CM | POA: Diagnosis not present

## 2021-12-10 DIAGNOSIS — F603 Borderline personality disorder: Secondary | ICD-10-CM

## 2021-12-10 DIAGNOSIS — F332 Major depressive disorder, recurrent severe without psychotic features: Secondary | ICD-10-CM | POA: Diagnosis not present

## 2021-12-10 DIAGNOSIS — F419 Anxiety disorder, unspecified: Secondary | ICD-10-CM

## 2021-12-10 NOTE — Progress Notes (Cosign Needed Addendum)
[facility] MD/PA/NP OP Progress Note  12/10/2021 7:26 PM Krystal [name]  MRN:  [id number] Virtual Visit via Video Note  I connected with Krystal [name] on 12/10/21 at  1:20 PM EDT by a video enabled telemedicine application and verified that I am speaking with the correct person using two identifiers.  Location: Patient: home Provider: Clinic   I discussed the limitations of evaluation and management by telemedicine and the availability of in person appointments. The patient expressed understanding and agreed to proceed.   I discussed the assessment and treatment plan with the patient. The patient was provided an opportunity to ask questions and all were answered. The patient agreed with the plan and demonstrated an understanding of the instructions.   The patient was advised to call back or seek an in-person evaluation if the symptoms worsen or if the condition fails to improve as anticipated.  I provided  20 minutes of non-face-to-face time during this encounter.   Armando Reichert, MD  Chief Complaint:  Chief Complaint  Patient presents with   Follow-up   Depression   Anxiety   HPI:  Patient is a 31 year old female with past psychiatric history of PTSD, MDD, anxiety, and BPD and medical history of Hashimoto thyroiditis, asthma, migraine, hypertension see virtually in [facility] program for follow up. Pt initially joined [facility] program on 11/11/2021 but had to drop out as she was out of state but now has joined again on 12/10/21.  Patient reports that her mood is "not very good".  She reports that currently she is having migraines so she is not feeling good.  She reports improvement in her depression and anxiety.  She went to her sister's house in [place] to spend some time with her but now she is back in her home with her husband and kids.  Patient's daughter is still in the hospital and will likely be discharged in the next 2 weeks.  She reports that she is sleeping and eating better.  She  reports that Dr. Nelida Gores stopped her Seroquel due to nightmares and started her on propanolol 10 mg twice daily.  She reports that her medications are making her feel tired and she has low energy to do anything. Currently, she denies any suicidal ideations, homicidal ideations, auditory and visual hallucinations.  She denies any medication side effects and has been tolerating it well.  Discussed that as Dr. Nelida Gores recently started her on propanolol it may take few days for her body to adjust to medication.  She verbalizes understanding. Visit Diagnosis:    ICD-10-CM   1. Posttraumatic stress disorder  F43.10     2. Severe episode of recurrent major depressive disorder, without psychotic features ([place])  F33.2     3. Borderline personality disorder in adult [facility])  F60.3     4. Anxiety  F41.9       Past Psychiatric History: Previous Psych Diagnoses: PTSD, MDD, anxiety, BPD.  Prior inpatient treatment: In 2022, she was admitted to [facility] UC observation area for couple of days.  Current meds: Effexor XR 225 mg, Klonopin 1 mg OD as needed for anxiety, propanolol 10 mg twice daily (started recently).  Patient follows Dr. Nelida Gores at [facility] clinic Psychotherapy hx: Was getting therapy from [facility] every other week.  Cannot see Lorin Picket again and will see a different therapist. Previous suicidal attempts: At 14, she was going to inject air in her veins but her mom walked in the room.  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   PTSD (post-traumatic stress disorder)    Severe uncontrolled hypertension 10/08/2017   UTI (urinary tract infection)     Past Surgical History:  Procedure Laterality Date   APPENDECTOMY     BIOPSY  05/10/2020   Procedure: BIOPSY;  Surgeon: Arta Silence, MD;  Location: [facility];  Service: Endoscopy;;   CESAREAN SECTION N/A 12/06/2017   Procedure: CESAREAN SECTION;  Surgeon: Sloan Leiter, MD;  Location: [facility];  Service: Obstetrics;  Laterality: N/A;   CESAREAN SECTION  07/21/2021   Procedure: CESAREAN SECTION;  Surgeon: Donnamae Jude, MD;  Location: [facility];  Service: Obstetrics;;   CHOLECYSTECTOMY N/A 05/11/2020   Procedure: SINGLE SITE LAPAROSCOPIC CHOLECYSTECTOMY AND LIVER BIOSY;  Surgeon: Michael Boston, MD;  Location: [facility];  Service: General;  Laterality: N/A;   ESOPHAGOGASTRODUODENOSCOPY (EGD) WITH PROPOFOL N/A 05/10/2020   Procedure: ESOPHAGOGASTRODUODENOSCOPY (EGD) WITH PROPOFOL;  Surgeon: Arta Silence, MD;  Location: [facility];  Service: Endoscopy;  Laterality: N/A;   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   HAND RECONSTRUCTION Right    INCISION AND DRAINAGE     NASAL SEPTUM SURGERY      Family Psychiatric History: Psych: Sister-depression, PTSD SA/HA: Denies    Family History:  Family History  Problem Relation Age of Onset   Hypertension Mother    Hypertension Father     Social History:  Social History   Socioeconomic History   Marital status: Married    Spouse name: Not on file   Number of children: 3   Years of education: Not on file   Highest education level: 8th grade  Occupational History   Not on file  Tobacco Use   Smoking status: Never   Smokeless tobacco: Never   Tobacco comments:    She tried a cigarette and never smoked again  Vaping Use   Vaping Use: Never used  Substance and Sexual Activity   Alcohol use: Yes    Comment:  bottle of wine a week-occ   Drug use: No   Sexual activity: Not Currently  Other Topics Concern   Not on file  Social History Narrative   Not on file   Social Determinants of Health   Financial Resource Strain: Not on file  Food Insecurity: No Food Insecurity (06/13/2021)   Hunger Vital Sign    Worried About Running Out of Food in the Last Year: Never true    Ran Out of Food in the Last Year: Never true  Transportation Needs: Unmet Transportation Needs (06/13/2021)   PRAPARE - Hydrologist (Medical): Yes    Lack of Transportation (Non-Medical): Yes  Physical Activity: Not on file  Stress: Not on file  Social Connections: Not on file    Allergies:  Allergies  Allergen Reactions   Red Dye Other (See Comments)    ALL CAUSE PROBLEMS #40 IS WORST-MIGRAINES  Compazine [Prochlorperazine Maleate] Other (See Comments)    TWITCHING, CANT STAY STILL. Pt states she can tolerate promethazine   Other Other (See Comments)    FANSIDAR FOR MALARIA-ASTHMA   Vicodin [Hydrocodone-Acetaminophen] Itching, Nausea And Vomiting and Other (See Comments)    Pt states she can tolerate acetaminophen   Amitriptyline Hcl Other (See Comments)    Angry moods   Duloxetine Hcl Other (See Comments)    Passing out, trimble, irritable   Lisinopril Cough   Reglan [Metoclopramide] Other (See Comments)    restless   Nifedipine Nausea And Vomiting and Other (See Comments)    Discussed with patient and maternal fetal medicine, patient has intolerance but not frank allergy.    Metabolic Disorder Labs: Lab Results  Component Value Date   HGBA1C 5.1 04/01/2021   No results found for: "PROLACTIN" No results found for: "CHOL", "TRIG", "HDL", "CHOLHDL", "VLDL", "LDLCALC" Lab Results  Component Value Date   TSH 2.154 07/03/2021   TSH 2.178 07/02/2021    Therapeutic Level Labs: No results found for: "LITHIUM" No results found for: "VALPROATE" No results found for:  "CBMZ"  Current Medications: Current Outpatient Medications  Medication Sig Dispense Refill   acetaminophen (TYLENOL) 500 MG tablet Take 2 tablets (1,000 mg total) by mouth every 6 (six) hours as needed. 30 tablet 0   albuterol (VENTOLIN HFA) 108 (90 Base) MCG/ACT inhaler Inhale 2 puffs into the lungs every 4 (four) hours as needed for wheezing or shortness of breath (coughing fits). 18 g 1   amLODipine (NORVASC) 10 MG tablet Take 1 tablet (10 mg total) by mouth daily. 90 tablet 2   amoxicillin-clavulanate (AUGMENTIN) 875-125 MG tablet Take 1 tablet by mouth 2 (two) times daily. 20 tablet 0   carvedilol (COREG) 25 MG tablet Take 1 tablet (25 mg total) by mouth 2 (two) times daily. 180 tablet 3   cetirizine (ZYRTEC ALLERGY) 10 MG tablet Take 1 tablet (10 mg total) by mouth 2 (two) times daily. 60 tablet 5   clonazePAM (KLONOPIN) 1 MG tablet Take 1 tablet (1 mg total) by mouth 2 (two) times daily as needed for anxiety. 30 tablet 0   EPINEPHrine 0.3 mg/0.3 mL IJ SOAJ injection Inject 0.3 mg into the muscle as needed for anaphylaxis. 1 each 2   famotidine (PEPCID) 20 MG tablet Take 1 tablet (20 mg total) by mouth 2 (two) times daily. 60 tablet 5   hydrALAZINE (APRESOLINE) 100 MG tablet Take 1 tablet (100 mg total) by mouth 3 (three) times daily. 90 tablet 1   ibuprofen (ADVIL) 600 MG tablet Take 1 tablet (600 mg total) by mouth every 6 (six) hours as needed for fever, headache or moderate pain. 40 tablet 0   levothyroxine (SYNTHROID) 125 MCG tablet Take 1 tablet (125 mcg total) by mouth daily before breakfast. 45 tablet 3   Prenatal Multivit-Min-Fe-FA (PRE-NATAL PO) Take 1 tablet by mouth daily.     propranolol (INDERAL) 10 MG tablet Take 1 tablet (10 mg total) by mouth 2 (two) times daily as needed (for anxiety). 60 tablet 1   spironolactone (ALDACTONE) 25 MG tablet Take 1 tablet (25 mg total) by mouth daily. 90 tablet 3   venlafaxine XR (EFFEXOR-XR) 75 MG 24 hr capsule Take 3 capsules (225 mg  total) by mouth daily. 90 capsule 0   No current facility-administered medications for this visit.     Musculoskeletal: Strength & Muscle Tone: Not able to assess due to virtual visit Gait & Station: Not well to  assess due to virtual visit Patient leans: N/A  Psychiatric Specialty Exam: Review of Systems  unknown if currently breastfeeding.There is no height or weight on file to calculate BMI.  General Appearance: Casual  Eye Contact:  Fair  Speech:  Clear and Coherent and Normal Rate  Volume:  Normal  Mood:  Anxious and Dysphoric  Affect:  Constricted  Thought Process:  Coherent and Linear  Orientation:  Full (Time, Place, and Person)  Thought Content: Logical   Suicidal Thoughts:  No  Homicidal Thoughts:  No  Memory:  Immediate;   Good Recent;   Good  Judgement:  Good  Insight:  Good  Psychomotor Activity:  Normal  Concentration:  Concentration: Good and Attention Span: Good  Recall:  Good  Fund of Knowledge: Good  Language: Good  Akathisia:  No  Handed:  Right  AIMS (if indicated): not done  Assets:  Communication Skills Desire for Improvement Housing Resilience Social Support  ADL's:  Intact  Cognition: WNL  Sleep: Better   Screenings: GAD-7    Flowsheet Row Routine Prenatal from 06/13/2021 in [facility] Routine Prenatal from 05/23/2021 in [facility] Initial Prenatal from 04/01/2021 in [facility] Office Visit from 10/09/2020 in [place] for [facility] Office Visit from 09/26/2020 in [place] for [facility]  Total GAD-7 Score '11 13 6 17 20      '$ PHQ2-9    Flowsheet Row Counselor from 11/11/2021 in [facility] Counselor from 11/05/2021 in [facility] Office Visit from  10/21/2021 in [facility] Routine Prenatal from 06/13/2021 in [facility] [place] [facility] Routine Prenatal from 05/23/2021 in [facility] [place] [facility]  PHQ-2 Total Score '6 6 4 2 2  '$ PHQ-9 Total Score '25 25 20 10 12      '$ Flowsheet Row Counselor from 11/11/2021 in [facility] Counselor from 11/05/2021 in [facility] Office Visit from 10/21/2021 in [facility]  C-SSRS RISK CATEGORY Error: Question 6 not populated Low Risk No Risk        Assessment and Plan:  Patient is a 31 year old female with past psychiatric history of PTSD, MDD, anxiety, and BPD and medical history of Hashimoto thyroiditis, asthma, migraine, hypertension see virtually in [facility] program for follow up. Pt initially joined [facility] program on 11/11/2021 but had to drop out as she was out of state but now has joined again on 12/10/21. Patient was started on propanolol by Dr. Nelida Gores and Seroquel was stopped due to nightmares.  Patient reports improvement in her depression and anxiety.  She reports tiredness.  As propanolol was started recently, will continue same medications and will continue to monitor. MDD, Severe, recurrent PTSD BPD Anxiety  Plan: -Continue Effexor XR 225 mg nightly for depression and anxiety. -Seroquel stopped by Dr. Nelida Gores due to side effects -Atarax stopped. (Not effective) -Continue propanolol 10 mg twice daily for anxiety. -Continue Klonopin 1 mg OD as needed for panic attacks   - Follow up-next week  Collaboration of Care: Collaboration of Care: Other Dr Nelida Gores and [place] team  Patient/Guardian was advised Release of Information must be obtained prior to any record release in order to collaborate  their care with an outside provider. Patient/Guardian was advised if they have not already done so to  contact the registration department to sign all necessary forms in order for [place] to release information regarding their care.   Consent: Patient/Guardian gives verbal consent for treatment and assignment of benefits for services provided during this visit. Patient/Guardian expressed understanding and agreed to proceed.    Armando Reichert, MD PGY3 12/10/2021, 7:26 PM

## 2021-12-11 ENCOUNTER — Telehealth (HOSPITAL_COMMUNITY): Payer: Self-pay | Admitting: Professional

## 2021-12-11 ENCOUNTER — Ambulatory Visit (HOSPITAL_COMMUNITY): Payer: Medicaid Other

## 2021-12-11 ENCOUNTER — Encounter (HOSPITAL_COMMUNITY): Payer: Self-pay

## 2021-12-12 ENCOUNTER — Ambulatory Visit (INDEPENDENT_AMBULATORY_CARE_PROVIDER_SITE_OTHER): Payer: Medicaid Other | Admitting: Licensed Clinical Social Worker

## 2021-12-12 DIAGNOSIS — F332 Major depressive disorder, recurrent severe without psychotic features: Secondary | ICD-10-CM

## 2021-12-12 DIAGNOSIS — F4310 Post-traumatic stress disorder, unspecified: Secondary | ICD-10-CM

## 2021-12-13 ENCOUNTER — Telehealth (HOSPITAL_COMMUNITY): Payer: Self-pay | Admitting: Professional

## 2021-12-13 ENCOUNTER — Encounter (HOSPITAL_COMMUNITY): Payer: Self-pay | Admitting: Psychiatry

## 2021-12-13 ENCOUNTER — Ambulatory Visit (HOSPITAL_COMMUNITY): Payer: Medicaid Other

## 2021-12-16 ENCOUNTER — Ambulatory Visit (HOSPITAL_COMMUNITY): Payer: Medicaid Other

## 2021-12-16 ENCOUNTER — Telehealth (HOSPITAL_COMMUNITY): Payer: Self-pay

## 2021-12-16 ENCOUNTER — Telehealth (HOSPITAL_COMMUNITY): Payer: Self-pay | Admitting: Professional

## 2021-12-16 MED ORDER — CLONAZEPAM 1 MG PO TABS: 1.0000 mg | ORAL_TABLET | Freq: Every day | ORAL | 0 refills | Status: DC | PRN

## 2021-12-16 NOTE — Telephone Encounter (Signed)
  Patient called upset stating that she is having extreme panic attacks since Friday she is out of her medication due to having to take it twice on Friday and Saturday she has not had any sleep for the last 3 days she is under a lot of stress her child is still in the hospital she thought they were going to discharge her but did not her 31 year old is dealing with a illness also she is requesting  a refill for her medication please advise.   Disp Refills Start End   clonazePAM (KLONOPIN) 1 MG tablet 30 tablet 0 11/18/2021    Sig - Route: Take 1 tablet (1 mg total) by mouth 2 (two) times daily as needed for anxiety. - Oral   Sent to pharmacy as: clonazePAM (KLONOPIN) 1 MG tablet   E-Prescribing Status: Receipt confirmed by pharmacy (11/18/2021 10:51 AM EDT)

## 2021-12-16 NOTE — Telephone Encounter (Signed)
Cln called pt due to pt not being in PHP. Pt reports baby cannot hold body temp and is still at the hospital on antibiotics. Pt reports she is struggling to sleep and having panic attacks. Pt reports she will reach out to her provider but is unable to remember his name. Cln is able to provide name and number. Cln also sent chat via Epic informing Dr. Nelida Gores and Selinda Eon (nurse) about pt's concerns. Pt denies current SI/HI and reports she plans to come to group tomorrow.

## 2021-12-17 ENCOUNTER — Ambulatory Visit (HOSPITAL_COMMUNITY): Payer: Medicaid Other

## 2021-12-18 ENCOUNTER — Ambulatory Visit: Payer: Self-pay

## 2021-12-18 ENCOUNTER — Ambulatory Visit: Payer: Commercial Managed Care - HMO | Admitting: Allergy

## 2021-12-18 ENCOUNTER — Ambulatory Visit (HOSPITAL_COMMUNITY): Payer: Medicaid Other

## 2021-12-18 NOTE — Lactation Note (Signed)
This note was copied from a baby's chart.  NICU Lactation Consultation Note  Patient Name: Krystal [name] SHFWY'O Date: 12/18/2021 Age:33 m.o.  Subjective Reason for consult: Follow-up assessment; NICU baby; Term  Visited with family of 47 43/31 weeks old (adjusted) NICU female, Ms. Krystal [name] is a P3 and she's finally taking baby "Krystal [name]" home today; she was emotional. Reviewed discharge education, feeding plan and the importance of continuing pumping after feedings at the breast to protect her supply. Baby will primarily be feeding though her G tube; but mom is planning to continuing taking her to breast on feeding cues. She would like to be F/U by [facility] OP, referral to [facility]. sent. All questions and concerns answered, family to contact [facility] services PRN.  Objective Infant data: Mother's Current Feeding Choice: Breast Milk and Formula  Infant feeding assessment Scale for Readiness: 3  Maternal data: [id number]  C-Section, Low Transverse Pumping frequency: 5 times/24 hours Pumped volume: 60 mL (sometimes is more) Flange Size: 27 WIC Program: Yes WIC Referral Sent?: Yes Pump: Personal (WIC pump and Zomie Z2 since 10/19/21)  Assessment Infant: In NICU  Maternal: Milk volume: Low  Intervention/Plan Interventions: Breast feeding basics reviewed; DEBP; Education Tools: Pump; [place]anges Pump Education: Setup, frequency, and cleaning; Milk Storage  Plan of care:  Consult Status: Complete   Krystal [name] S Krystal [name] 12/18/2021, 12:55 PM

## 2021-12-18 NOTE — Lactation Note (Incomplete)
This note was copied from a baby's chart.  NICU Lactation Consultation Note  Patient Name: Krystal [name] ENIDP'O Date: 12/18/2021 Age:33 m.o.  Subjective Reason for consult: Follow-up assessment; NICU baby; Term  Visited with family of 70  Objective Infant data: Mother's Current Feeding Choice: Breast Milk and Formula  Infant feeding assessment Scale for Readiness: 3  Maternal data: [id number]  C-Section, Low Transverse WIC Program: Yes WIC Referral Sent?: Yes Pump: Personal (WIC pump and Zomie Z2 since 10/19/21)  Assessment Infant: In NICU  Maternal: No S/S of engorgement at baby's discharge  Intervention/Plan Interventions: Breast feeding basics reviewed; DEBP; Education  Plan: Consult Status: Complete   [place] 12/18/2021, 12:40 PM

## 2021-12-24 ENCOUNTER — Ambulatory Visit (HOSPITAL_BASED_OUTPATIENT_CLINIC_OR_DEPARTMENT_OTHER): Payer: Medicaid Other | Admitting: Cardiovascular Disease

## 2021-12-26 ENCOUNTER — Ambulatory Visit (HOSPITAL_COMMUNITY): Payer: Medicaid Other | Admitting: Licensed Clinical Social Worker

## 2022-01-07 ENCOUNTER — Encounter (HOSPITAL_COMMUNITY): Payer: Self-pay | Admitting: Psychiatry

## 2022-01-07 ENCOUNTER — Telehealth (HOSPITAL_BASED_OUTPATIENT_CLINIC_OR_DEPARTMENT_OTHER): Payer: Medicaid Other | Admitting: Psychiatry

## 2022-01-07 DIAGNOSIS — F4310 Post-traumatic stress disorder, unspecified: Secondary | ICD-10-CM | POA: Diagnosis not present

## 2022-01-07 DIAGNOSIS — F419 Anxiety disorder, unspecified: Secondary | ICD-10-CM

## 2022-01-07 DIAGNOSIS — F332 Major depressive disorder, recurrent severe without psychotic features: Secondary | ICD-10-CM

## 2022-01-07 DIAGNOSIS — F603 Borderline personality disorder: Secondary | ICD-10-CM

## 2022-01-07 DIAGNOSIS — M797 Fibromyalgia: Secondary | ICD-10-CM

## 2022-01-07 MED ORDER — LURASIDONE HCL 20 MG PO TABS: 20.0000 mg | ORAL_TABLET | Freq: Every day | ORAL | 1 refills | Status: DC

## 2022-01-07 MED ORDER — PROPRANOLOL HCL 20 MG PO TABS: 20.0000 mg | ORAL_TABLET | Freq: Two times a day (BID) | ORAL | 1 refills | Status: DC | PRN

## 2022-01-07 MED ORDER — VENLAFAXINE HCL ER 75 MG PO CP24: 225.0000 mg | ORAL_CAPSULE | Freq: Every day | ORAL | 1 refills | Status: DC

## 2022-01-07 MED ORDER — CLONAZEPAM 1 MG PO TABS: 1.0000 mg | ORAL_TABLET | Freq: Every day | ORAL | 0 refills | Status: DC | PRN
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
## 2022-01-08 ENCOUNTER — Telehealth (HOSPITAL_COMMUNITY): Payer: Self-pay

## 2022-01-08 NOTE — Telephone Encounter (Signed)
PA initiated for Lurasidone HCL 20 mg tablets via Covermymeds PA approved/ case #[id number] Coverage 01/08/22-----01/08/23

## 2022-01-29 ENCOUNTER — Other Ambulatory Visit: Payer: Self-pay | Admitting: Ophthalmology

## 2022-01-30 ENCOUNTER — Encounter: Payer: Self-pay | Admitting: *Deleted

## 2022-02-02 NOTE — Progress Notes (Deleted)
Follow Up Note  RE: Krystal [name] MRN: [id number] DOB: 02-Sep-1990 Date of Office Visit: 02/03/2022  Referring provider: No ref. provider found Primary care provider: Patient, No Pcp Per  Chief Complaint: No chief complaint on file.  History of Present Illness: I had the pleasure of seeing Krystal [name] for a follow up visit at the [facility] [place] on 02/02/2022. She is a 31 y.o. female, who is being followed for asthma, allergic rhinitis, chronic urticaria. Her previous allergy office visit was on 12/04/2021 with Dr. Maudie Mercury. Today is a regular follow up visit.  Moderate persistent asthma with acute exacerbation Past history - Usually flares in the spring and uses albuterol daily then. No prior maintenance inhalers. 2022 spirometry was normal.  Interim history - symptoms flared. Last prednisone use was in June 2023. 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 Clinically feeling improved.  Start prednisone taper.  Daily controller medication(s): start Symbicort 62mg 2 puffs twice a day with spacer and rinse mouth afterwards. May use albuterol rescue inhaler 2 puffs every 4 to 6 hours as needed for shortness of breath, chest tightness, coughing, and wheezing. May use albuterol rescue inhaler 2 puffs 5 to 15 minutes prior to strenuous physical activities. Monitor frequency of use.  Get flu shot when feeling better.  Recommend Covid-19 vaccinations as well. Get spirometry at next visit.   Perennial allergic rhinitis Past history - Rhino conjunctivitis symptoms mainly in the spring and fall and around close cat contact. Deviated septum and nasal polypectomy surgery in the past.1 cat at home. 2022 skin prick testing was only positive to cats. 2022 bloodwork positive to cat and dog.  Interim history - symptoms flared. Last antibiotics in June 2023. If not doing better by Friday then start  antibiotics.  Augmentin 878mtwice a day x 10 days.  Start Ryaltris (olopatadine + mometasone nasal spray combination) 1-2 sprays per nostril twice a day.  Nasal saline spray (i.e., Simply Saline) or nasal saline lavage (i.e., NeilMed) is recommended as needed and prior to medicated nasal sprays. Continue environmental control measures. Use over the counter antihistamines such as Zyrtec (cetirizine), Claritin (loratadine), Allegra (fexofenadine), or Xyzal (levocetirizine) daily as needed. May take twice a day during allergy flares. May switch antihistamines every few months.   Chronic urticaria Past history - Breaking out in whole body hives a few times per week with associated periorbital/lip swelling for the past 2-3 months - sometimes has trouble breathing. No triggers noted. Spouse concerned about cat allergies - 1 cat at home x 6+ years. Tried antihistamines with some benefit. 1 course of prednisone. Bloodwork (CBC diff, CMP, TSH, ESR, CRP, tryptase, Alpha gal) all normal. CU elevated, positive ANA with elevated RNP antibody - referral for rheumatology placed in December 2022.  Interim history - well controlled but still have hives sometimes.  Continue zyrtec (cetirizine) 1085mwice a day. If symptoms are not controlled or causes drowsiness let us [place]. Continue Pepcid (famotidine) 60m1mice a day.  May take benadryl 25mg44m50mg 83my 4-6 hours additionally as needed for flares. Avoid the following potential triggers: alcohol, tight clothing, NSAIDs, hot showers and getting overheated.   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 Negative hymenoptera panel Continue to avoid.  Assessment and Plan: [id number].[id number] with: No problem-specific Assessment & Plan notes found for this encounter.  No follow-ups on file.  No orders of the defined types were placed in this encounter.  Lab Orders  No  laboratory test(s) ordered today    Diagnostics: Spirometry:  Tracings reviewed. Her effort: {Blank single:19197::"Good reproducible efforts.","It was hard to get consistent efforts and there is a question as to whether this reflects a maximal maneuver.","Poor effort, data can not be interpreted."} FVC: ***L FEV1: ***L, ***% predicted FEV1/FVC ratio: ***% Interpretation: {Blank single:19197::"Spirometry consistent with mild obstructive disease","Spirometry consistent with moderate obstructive disease","Spirometry consistent with severe obstructive disease","Spirometry consistent with possible restrictive disease","Spirometry consistent with mixed obstructive and restrictive disease","Spirometry uninterpretable due to technique","Spirometry consistent with normal pattern","No overt abnormalities noted given today's efforts"}.  Please see scanned spirometry results for details.  Skin Testing: {Blank single:19197::"Select foods","Environmental allergy panel","Environmental allergy panel and select foods","Food allergy panel","None","Deferred due to recent antihistamines use"}. *** Results discussed with patient/family.   Medication List:  Current Outpatient Medications  Medication Sig Dispense Refill  . acetaminophen (TYLENOL) 500 MG tablet Take 2 tablets (1,000 mg total) by mouth every 6 (six) hours as needed. 30 tablet 0  . albuterol (VENTOLIN HFA) 108 (90 Base) MCG/ACT inhaler Inhale 2 puffs into the lungs every 4 (four) hours as needed for wheezing or shortness of breath (coughing fits). 18 g 1  . amLODipine (NORVASC) 10 MG tablet Take 1 tablet (10 mg total) by mouth daily. 90 tablet 2  . amoxicillin-clavulanate (AUGMENTIN) 875-125 MG tablet Take 1 tablet by mouth 2 (two) times daily. 20 tablet 0  . carvedilol (COREG) 25 MG tablet Take 1 tablet (25 mg total) by mouth 2 (two) times daily. 180 tablet 3  . cetirizine (ZYRTEC ALLERGY) 10 MG tablet Take 1 tablet (10 mg total) by mouth 2 (two)  times daily. 60 tablet 5  . clonazePAM (KLONOPIN) 1 MG tablet Take 1 tablet (1 mg total) by mouth daily as needed for anxiety. 30 tablet 0  . EPINEPHrine 0.3 mg/0.3 mL IJ SOAJ injection Inject 0.3 mg into the muscle as needed for anaphylaxis. 1 each 2  . famotidine (PEPCID) 20 MG tablet Take 1 tablet (20 mg total) by mouth 2 (two) times daily. 60 tablet 5  . hydrALAZINE (APRESOLINE) 100 MG tablet Take 1 tablet (100 mg total) by mouth 3 (three) times daily. 90 tablet 1  . ibuprofen (ADVIL) 600 MG tablet Take 1 tablet (600 mg total) by mouth every 6 (six) hours as needed for fever, headache or moderate pain. 40 tablet 0  . levothyroxine (SYNTHROID) 125 MCG tablet Take 1 tablet (125 mcg total) by mouth daily before breakfast. 45 tablet 3  . lurasidone (LATUDA) 20 MG TABS tablet Take 1 tablet (20 mg total) by mouth daily. Take with food (dinner) 30 tablet 1  . Prenatal Multivit-Min-Fe-FA (PRE-NATAL PO) Take 1 tablet by mouth daily.    . propranolol (INDERAL) 20 MG tablet Take 1 tablet (20 mg total) by mouth 2 (two) times daily as needed (for anxiety). 60 tablet 1  . spironolactone (ALDACTONE) 25 MG tablet Take 1 tablet (25 mg total) by mouth daily. 90 tablet 3  . venlafaxine XR (EFFEXOR-XR) 75 MG 24 hr capsule Take 3 capsules (225 mg total) by mouth daily. 90 capsule 1   No current facility-administered medications for this visit.   Allergies: Allergies  Allergen Reactions  . Red Dye Other (See Comments)    ALL CAUSE PROBLEMS #40 IS WORST-MIGRAINES   . Compazine [Prochlorperazine Maleate] Other (See Comments)    TWITCHING, CANT STAY STILL. Pt states  she can tolerate promethazine  . Other Other (See Comments)    FANSIDAR FOR MALARIA-ASTHMA  . Vicodin [Hydrocodone-Acetaminophen] Itching, Nausea And Vomiting and Other (See Comments)    Pt states she can tolerate acetaminophen  . Amitriptyline Hcl Other (See Comments)    Angry moods  . Duloxetine Hcl Other (See Comments)    Passing out,  trimble, irritable  . Lisinopril Cough  . Reglan [Metoclopramide] Other (See Comments)    restless  . Nifedipine Nausea And Vomiting and Other (See Comments)    Discussed with patient and maternal fetal medicine, patient has intolerance but not frank allergy.   I reviewed her past medical history, social history, family history, and environmental history and no significant changes have been reported from her previous visit.  Review of Systems  Constitutional:  Negative for appetite change, chills, fever and unexpected weight change.  HENT:  Positive for sinus pressure and sinus pain. Negative for congestion and rhinorrhea.   Eyes:  Negative for itching.  Respiratory:  Positive for cough, chest tightness and shortness of breath. Negative for wheezing.   Cardiovascular:  Negative for chest pain.  Gastrointestinal:  Negative for abdominal pain.  Genitourinary:  Negative for difficulty urinating.  Skin:  Positive for rash.  Allergic/Immunologic: Positive for environmental allergies. Negative for food allergies.  Neurological:  Positive for headaches.   Objective: There were no vitals taken for this visit. There is no height or weight on file to calculate BMI. Physical Exam Vitals and nursing note reviewed.  Constitutional:      Appearance: Normal appearance. She is well-developed.  HENT:     Head: Normocephalic and atraumatic.     Right Ear: Tympanic membrane and external ear normal.     Left Ear: Tympanic membrane and external ear normal.     Nose: Nose normal.     Mouth/Throat:     Mouth: Mucous membranes are moist.     Pharynx: Oropharynx is clear.  Eyes:     Conjunctiva/sclera: Conjunctivae normal.  Cardiovascular:     Rate and Rhythm: Normal rate and regular rhythm.     Heart sounds: Normal heart sounds. No murmur heard. Pulmonary:     Effort: Pulmonary effort is normal.     Breath sounds: No wheezing, rhonchi or rales.     Comments: Decreased breath sounds  throughout. Musculoskeletal:     Cervical back: Neck supple.  Skin:    General: Skin is warm.     Findings: No rash.  Neurological:     Mental Status: She is alert and oriented to person, place, and time.  Psychiatric:        Behavior: Behavior normal.  Previous notes and tests were reviewed. The plan was reviewed with the patient/family, and all questions/concerned were addressed.  It was my pleasure to see Krystal [name] today and participate in her care. Please feel free to contact me with any questions or concerns.  Sincerely,  Rexene Alberts, DO Allergy & Immunology  [facility] office: [place] office: [phone]

## 2022-02-03 ENCOUNTER — Ambulatory Visit: Payer: Medicaid Other | Admitting: Allergy

## 2022-02-03 DIAGNOSIS — L508 Other urticaria: Secondary | ICD-10-CM

## 2022-02-03 DIAGNOSIS — J3089 Other allergic rhinitis: Secondary | ICD-10-CM

## 2022-02-04 ENCOUNTER — Telehealth (HOSPITAL_COMMUNITY): Payer: Medicaid Other | Admitting: Psychiatry

## 2022-02-05 ENCOUNTER — Ambulatory Visit (HOSPITAL_BASED_OUTPATIENT_CLINIC_OR_DEPARTMENT_OTHER): Payer: Medicaid Other | Admitting: Cardiovascular Disease

## 2022-02-10 NOTE — Psych (Signed)
Virtual Visit via Video Note  I connected with Krystal [name] on 11/18/21 at  9:00 AM EDT by a video enabled telemedicine application and verified that I am speaking with the correct person using two identifiers.  Location: Patient: patient home Provider: clinical home office   I discussed the limitations of evaluation and management by telemedicine and the availability of in person appointments. The patient expressed understanding and agreed to proceed.  I discussed the assessment and treatment plan with the patient. The patient was provided an opportunity to ask questions and all were answered. The patient agreed with the plan and demonstrated an understanding of the instructions.   The patient was advised to call back or seek an in-person evaluation if the symptoms worsen or if the condition fails to improve as anticipated.  Pt was provided 240 minutes of non-face-to-face time during this encounter.   Lorin Glass, LCSW   [facility] PHP THERAPIST PROGRESS NOTE  Krystal [name] [id number]  Session Time: 9:00 - 10:00  Participation Level: Active  Behavioral Response: CasualAlertDepressed  Type of Therapy: Group Therapy  Treatment Goals addressed: Coping  Progress Towards Goals: Progressing  Interventions: CBT, DBT, Supportive, and Reframing  Summary: Krystal [name] is a 31 y.o. female who presents with depression, PTSD, and mood symptoms.  Clinician led check-in regarding current stressors and situation, and review of patient completed daily inventory. Clinician utilized active listening and empathetic response and validated patient emotions. Clinician facilitated processing group on pertinent issues.?    Summary: Patient arrived within time allowed. Patient rates her mood at a 3 on a scale of 1-10 with 10 being best. Pt reports feeling "like an emotional wreck." Pt reports she was triggered over the weekend into trauma thoughts/memories. Pt reports nightmares  and waking up in sweat. Pt states difficulty with her family. Patient able to process. Patient engaged in discussion.          Session Time: 10:00 am - 11:00 am   Participation Level: Active   Behavioral Response: CasualAlertDepressed   Type of Therapy: Group Therapy   Treatment Goals addressed: Coping   Progress Towards Goals: Progressing   Interventions: CBT, DBT, Solution Focused, Strength-based, Supportive, and Reframing   Therapist Response:  Cln led processing group for pt's current struggles. Group members shared stressors and provided support and feedback. Cln brought in topics of boundaries, healthy relationships, and unhealthy thought processes to inform discussion.    Therapist Response: Pt able to process and provide support to group.          Session Time: 11:00 -12:00   Participation Level: Active   Behavioral Response: CasualAlertDepressed   Type of Therapy: Group Therapy   Treatment Goals addressed: Coping   Progress Towards Goals: Progressing   Interventions: CBT, DBT, Solution Focused, Strength-based, Supportive, and Reframing   Summary: Cln introduced topic of DBT distress tolerance skills. Cln provided context for distress tolerance skills and how to practice them. Cln introduced the ACCEPTS distraction skills and group discusses ways to utilize "A" activities.    Therapist Response: Pt engaged in discussion and is able to state ways to apply this skill.          Session Time: 12:00 -1:00   Participation Level: Active   Behavioral Response: CasualAlertDepressed   Type of Therapy: Group therapy, Occupational Therapy   Treatment Goals addressed: Coping   Progress Towards Goals: Progressing   Interventions: Supportive; Psychoeducation   Summary: 12:00 - 12:50: Occupational Therapy group led by cln  E. Hollan. 12:50 - 1:00 Clinician assessed for immediate needs, medication compliance and efficacy, and safety concerns.   Therapist  Response: 12:00 - 12:50: Pt participated 12:50 - 1:00 pm: At check-out, patient reports no immediate concerns. Patient demonstrates progress as evidenced by engagement in group and continued receptiveness to treatment. Patient denies SI/HI/self-harm thoughts at the end of group.      Suicidal/Homicidal: Nowithout intent/plan  Plan: Pt will continue in PHP while working to decrease depression and trauma symptoms, increase emotion regulation symptoms, and increase ability to manage symptoms in a healthy manner.   Collaboration of Care: Medication Management AEB V Doda  Patient/Guardian was advised Release of Information must be obtained prior to any record release in order to collaborate their care with an outside provider. Patient/Guardian was advised if they have not already done so to contact the registration department to sign all necessary forms in order for [place] to release information regarding their care.   Consent: Patient/Guardian gives verbal consent for treatment and assignment of benefits for services provided during this visit. Patient/Guardian expressed understanding and agreed to proceed.   Diagnosis: Severe episode of recurrent major depressive disorder, without psychotic features ([place]) [F33.2]    1. Severe episode of recurrent major depressive disorder, without psychotic features ([place])   2. Borderline personality disorder in adult [facility])   3. Anxiety   4. Fibromyalgia      Lorin Glass, LCSW

## 2022-02-10 NOTE — Psych (Signed)
Virtual Visit via Video Note  I connected with Krystal [name] on 11/11/21 at  9:00 AM EDT by a video enabled telemedicine application and verified that I am speaking with the correct person using two identifiers.  Location: Patient: patient home Provider: clinical home office   I discussed the limitations of evaluation and management by telemedicine and the availability of in person appointments. The patient expressed understanding and agreed to proceed.  I discussed the assessment and treatment plan with the patient. The patient was provided an opportunity to ask questions and all were answered. The patient agreed with the plan and demonstrated an understanding of the instructions.   The patient was advised to call back or seek an in-person evaluation if the symptoms worsen or if the condition fails to improve as anticipated.  Pt was provided 240 minutes of non-face-to-face time during this encounter.   Lorin Glass, LCSW   [facility] [place] PHP THERAPIST PROGRESS NOTE  Krystal [name] [id number]  Session Time: 9:00 - 10:00  Participation Level: Active  Behavioral Response: CasualAlertDepressed  Type of Therapy: Group Therapy  Treatment Goals addressed: Coping  Progress Towards Goals: Initial  Interventions: CBT, DBT, Supportive, and Reframing  Summary: Krystal [name] is a 31 y.o. female who presents with depression, PTSD, and mood symptoms.  Clinician led check-in regarding current stressors and situation, and review of patient completed daily inventory. Clinician utilized active listening and empathetic response and validated patient emotions. Clinician facilitated processing group on pertinent issues.?    Summary: Patient arrived within time allowed. Patient rates her mood at a 5 on a scale of 1-10 with 10 being best. Pt reports "everything is piling up." Pt reports her sleep and appetite are poor. Pt reports heightened anxiety over the weekend and is tearful during  session. Pt shares feelings of worthlessness and not feeling capable to manage. Patient able to process. Patient engaged in discussion.          Session Time: 10:00 am - 11:00 am   Participation Level: Active   Behavioral Response: CasualAlertDepressed   Type of Therapy: Group Therapy   Treatment Goals addressed: Coping   Progress Towards Goals: Progressing   Interventions: CBT, DBT, Solution Focused, Strength-based, Supportive, and Reframing   Therapist Response: Cln led discussion on feelings and the role they play for our lives. Cln contextualized feelings as a warning system that brings attention to areas of our lives that need focus. Group members discussed how to pay attention to what the feeling is telling us versus reacting to unpleasant aspects of a feeling.    Therapist Response: Pt engaged in discussion and reports understanding.          Session Time: 11:00 -12:00   Participation Level: Active   Behavioral Response: CasualAlertDepressed   Type of Therapy: Group Therapy   Treatment Goals addressed: Coping   Progress Towards Goals: Progressing   Interventions: CBT, DBT, Solution Focused, Strength-based, Supportive, and Reframing   Summary: Cln introduced DBT distress tolerance skill IMPROVE.  Cln discussed how this set of skills are for when you have to sit through an undesirable feeling and wait for it to pass. Group discussed how to apply the IMPROVE skills to decrease distress at the undesired feeling.    Therapist Response:  Pt engaged in discussion and is able to identify ways to apply the skill.          Session Time: 12:00 -1:00   Participation Level: Active   Behavioral Response: CasualAlertDepressed  Type of Therapy: Group therapy, Occupational Therapy   Treatment Goals addressed: Coping   Progress Towards Goals: Progressing   Interventions: Supportive; Psychoeducation   Summary: 12:00 - 12:50: Occupational Therapy group led by cln E.  Hollan. 12:50 - 1:00 Clinician assessed for immediate needs, medication compliance and efficacy, and safety concerns.   Therapist Response: 12:00 - 12:50: Pt participated 12:50 - 1:00 pm: At check-out, patient reports no immediate concerns. Patient demonstrates progress as evidenced by participating in first group session. Patient denies SI/HI/self-harm thoughts at the end of group.        Suicidal/Homicidal: Nowithout intent/plan  Plan: Pt will continue in PHP while working to decrease depression and trauma symptoms, increase emotion regulation symptoms, and increase ability to manage symptoms in a healthy manner.   Collaboration of Care: Medication Management AEB V Doda  Patient/Guardian was advised Release of Information must be obtained prior to any record release in order to collaborate their care with an outside provider. Patient/Guardian was advised if they have not already done so to contact the registration department to sign all necessary forms in order for [place] to release information regarding their care.   Consent: Patient/Guardian gives verbal consent for treatment and assignment of benefits for services provided during this visit. Patient/Guardian expressed understanding and agreed to proceed.   Diagnosis: Posttraumatic stress disorder [F43.10]    1. Posttraumatic stress disorder   2. Borderline personality disorder in adult [facility])   3. Severe episode of recurrent major depressive disorder, without psychotic features ([place])   4. Anxiety      Lorin Glass, LCSW

## 2022-02-10 NOTE — Psych (Signed)
Virtual Visit via Video Note  I connected with Krystal [name] on 11/12/21 at  9:00 AM EDT by a video enabled telemedicine application and verified that I am speaking with the correct person using two identifiers.  Location: Patient: patient home Provider: clinical home office   I discussed the limitations of evaluation and management by telemedicine and the availability of in person appointments. The patient expressed understanding and agreed to proceed.  I discussed the assessment and treatment plan with the patient. The patient was provided an opportunity to ask questions and all were answered. The patient agreed with the plan and demonstrated an understanding of the instructions.   The patient was advised to call back or seek an in-person evaluation if the symptoms worsen or if the condition fails to improve as anticipated.  Pt was provided 240 minutes of non-face-to-face time during this encounter.   Lorin Glass, LCSW   [facility] PHP THERAPIST PROGRESS NOTE  Krystal [name] [id number]  Session Time: 9:00 - 10:00  Participation Level: Active  Behavioral Response: CasualAlertDepressed  Type of Therapy: Group Therapy  Treatment Goals addressed: Coping  Progress Towards Goals: Initial  Interventions: CBT, DBT, Supportive, and Reframing  Summary: Krystal [name] is a 31 y.o. female who presents with depression, PTSD, and mood symptoms.  Clinician led check-in regarding current stressors and situation, and review of patient completed daily inventory. Clinician utilized active listening and empathetic response and validated patient emotions. Clinician facilitated processing group on pertinent issues.?    Summary: Patient arrived within time allowed. Patient rates her mood at a 6 on a scale of 1-10 with 10 being best. Pt reports feeling "stressed" after group yesterday. Pt states she visited her daughter in the hospital and she will be able to come home sooner than  expected. Pt reports anxiety re: baby coming home because of the special attention she requires. Pt states having a "weird" conversation with her mom which upset and triggered pt. Pt reports going to bed early however waking up multiple times throughout the night. Patient able to process. Patient engaged in discussion.          Session Time: 10:00 am - 11:00 am   Participation Level: Active   Behavioral Response: CasualAlertDepressed   Type of Therapy: Group Therapy   Treatment Goals addressed: Coping   Progress Towards Goals: Progressing   Interventions: CBT, DBT, Solution Focused, Strength-based, Supportive, and Reframing   Therapist Response: Cln introduced DBT emotion regulation skill, PLEASE. Cln discussed the importance of taking care of our whole body as a way to aid in stabilizing emotions. Group discussed ways they struggle to manage the elements of PLEASE and how to remove barriers.    Therapist Response: Pt engaged in discussion and is able to identify ways to utilize PLEASE skill.        Session Time: 11:00 -12:00   Participation Level: Active   Behavioral Response: CasualAlertDepressed   Type of Therapy: Group Therapy   Treatment Goals addressed: Coping   Progress Towards Goals: Progressing   Interventions: CBT, DBT, Solution Focused, Strength-based, Supportive, and Reframing   Summary: Cln led discussion on making difficult decisions. Group members discussed decisions they are struggling with. Cln utilized CBT thought challenging and DBT walking the middle path to inform discussion.   Therapist Response:  Pt engaged in discussion         Session Time: 12:00 -1:00   Participation Level: Active   Behavioral Response: CasualAlertDepressed   Type of Therapy:  Group therapy, Occupational Therapy   Treatment Goals addressed: Coping   Progress Towards Goals: Progressing   Interventions: Supportive; Psychoeducation   Summary: 12:00 - 12:50:  Occupational Therapy group led by cln E. Hollan. 12:50 - 1:00 Clinician assessed for immediate needs, medication compliance and efficacy, and safety concerns.   Therapist Response: 12:00 - 12:50: Pt participated 12:50 - 1:00 pm: At check-out, patient reports no immediate concerns. Patient demonstrates progress as evidenced by engagement in group and continued receptiveness to treatment. Patient denies SI/HI/self-harm thoughts at the end of group.      Suicidal/Homicidal: Nowithout intent/plan  Plan: Pt will continue in PHP while working to decrease depression and trauma symptoms, increase emotion regulation symptoms, and increase ability to manage symptoms in a healthy manner.   Collaboration of Care: Medication Management AEB V Doda  Patient/Guardian was advised Release of Information must be obtained prior to any record release in order to collaborate their care with an outside provider. Patient/Guardian was advised if they have not already done so to contact the registration department to sign all necessary forms in order for [place] to release information regarding their care.   Consent: Patient/Guardian gives verbal consent for treatment and assignment of benefits for services provided during this visit. Patient/Guardian expressed understanding and agreed to proceed.   Diagnosis: Severe episode of recurrent major depressive disorder, without psychotic features ([place]) [F33.2]    1. Severe episode of recurrent major depressive disorder, without psychotic features ([place])   2. Posttraumatic stress disorder      Lorin Glass, LCSW

## 2022-02-10 NOTE — Psych (Signed)
Virtual Visit via Video Note  I connected with Krystal [name] on 11/15/21 at  9:00 AM EDT by a video enabled telemedicine application and verified that I am speaking with the correct person using two identifiers.  Location: Patient: patient home Provider: clinical home office   I discussed the limitations of evaluation and management by telemedicine and the availability of in person appointments. The patient expressed understanding and agreed to proceed.  I discussed the assessment and treatment plan with the patient. The patient was provided an opportunity to ask questions and all were answered. The patient agreed with the plan and demonstrated an understanding of the instructions.   The patient was advised to call back or seek an in-person evaluation if the symptoms worsen or if the condition fails to improve as anticipated.  Pt was provided 240 minutes of non-face-to-face time during this encounter.   Lorin Glass, LCSW   [facility] [place] PHP THERAPIST PROGRESS NOTE  Krystal [name] [id number]  Session Time: 9:00 - 10:00  Participation Level: Active  Behavioral Response: CasualAlertDepressed  Type of Therapy: Group Therapy  Treatment Goals addressed: Coping  Progress Towards Goals: Progressing  Interventions: CBT, DBT, Supportive, and Reframing  Summary: Krystal [name] is a 31 y.o. female who presents with depression, PTSD, and mood symptoms.  Clinician led check-in regarding current stressors and situation, and review of patient completed daily inventory. Clinician utilized active listening and empathetic response and validated patient emotions. Clinician facilitated processing group on pertinent issues.?    Summary: Patient arrived within time allowed. Patient rates her mood at a 3 on a scale of 1-10 with 10 being best. Pt reports feeling "in a funk." Pt states her appetite and sleep were decreased. Pt states she woke up with back issues and her bp spiked this  morning. Pt states these things are not abnormal for her and she did message her PCP about them. Pt reports difficulty getting out of negative mindset. Patient able to process. Patient engaged in discussion.          Session Time: 10:00 am - 11:00 am   Participation Level: Active   Behavioral Response: CasualAlertDepressed   Type of Therapy: Group Therapy   Treatment Goals addressed: Coping   Progress Towards Goals: Progressing   Interventions: CBT, DBT, Solution Focused, Strength-based, Supportive, and Reframing   Therapist Response:  Cln led discussion on negative self-talk and how it affects [place]. Cln utilized CBT to discuss how thoughts shape our feelings and actions. Group members shared how negative thinking affects them and worked to reframe their negative thinking.    Therapist Response: Pt engaged in discussion and reports understanding.        Session Time: 11:00 -12:00   Participation Level: Active   Behavioral Response: CasualAlertDepressed   Type of Therapy: Group Therapy   Treatment Goals addressed: Coping   Progress Towards Goals: Progressing   Interventions: CBT, DBT, Solution Focused, Strength-based, Supportive, and Reframing   Summary: Cln led discussion on communication struggles. Group shared ways in which communication is difficult for them and what are typical barriers. Cln reminded pt's of assertiveness skills and "I" statements. Cln encouraged pt's to consider making notes for points they want to address in a discussion and/or script out what they want to say as a way to decrease anxiety and likeliness that they will get off topic.    Therapist Response:  Pt engaged in discussion and reports willingness to apply communication strategies.  Session Time: 12:00 -1:00   Participation Level: Active   Behavioral Response: CasualAlertDepressed   Type of Therapy: Group therapy, Occupational Therapy   Treatment Goals addressed: Coping    Progress Towards Goals: Progressing   Interventions: Supportive; Psychoeducation   Summary: 12:00 - 12:50: Occupational Therapy group led by cln E. Hollan. 12:50 - 1:00 Clinician assessed for immediate needs, medication compliance and efficacy, and safety concerns.   Therapist Response: 12:00 - 12:50: Pt participated 12:50 - 1:00 pm: At check-out, patient reports no immediate concerns. Patient demonstrates progress as evidenced by engagement in group and continued receptiveness to treatment. Patient denies SI/HI/self-harm thoughts at the end of group.      Suicidal/Homicidal: Nowithout intent/plan  Plan: Pt will continue in PHP while working to decrease depression and trauma symptoms, increase emotion regulation symptoms, and increase ability to manage symptoms in a healthy manner.   Collaboration of Care: Medication Management AEB V Doda  Patient/Guardian was advised Release of Information must be obtained prior to any record release in order to collaborate their care with an outside provider. Patient/Guardian was advised if they have not already done so to contact the registration department to sign all necessary forms in order for [place] to release information regarding their care.   Consent: Patient/Guardian gives verbal consent for treatment and assignment of benefits for services provided during this visit. Patient/Guardian expressed understanding and agreed to proceed.   Diagnosis: Severe episode of recurrent major depressive disorder, without psychotic features ([place]) [F33.2]    1. Severe episode of recurrent major depressive disorder, without psychotic features ([place])   2. Posttraumatic stress disorder      Lorin Glass, LCSW

## 2022-02-10 NOTE — Psych (Signed)
Virtual Visit via Video Note  I connected with Khelani C Odonell on 11/14/21 at  9:00 AM EDT by a video enabled telemedicine application and verified that I am speaking with the correct person using two identifiers.  Location: Patient: patient home Provider: clinical home office   I discussed the limitations of evaluation and management by telemedicine and the availability of in person appointments. The patient expressed understanding and agreed to proceed.  I discussed the assessment and treatment plan with the patient. The patient was provided an opportunity to ask questions and all were answered. The patient agreed with the plan and demonstrated an understanding of the instructions.   The patient was advised to call back or seek an in-person evaluation if the symptoms worsen or if the condition fails to improve as anticipated.  Pt was provided 240 minutes of non-face-to-face time during this encounter.   Lorin Glass, LCSW   [facility] PHP THERAPIST PROGRESS NOTE  MAIRIM BADE [id number]  Session Time: 9:00 - 10:00  Participation Level: Active  Behavioral Response: CasualAlertDepressed  Type of Therapy: Group Therapy  Treatment Goals addressed: Coping  Progress Towards Goals: Progressing  Interventions: CBT, DBT, Supportive, and Reframing  Summary: Darius Fillingim Arras is a 31 y.o. female who presents with depression, PTSD, and mood symptoms.  Clinician led check-in regarding current stressors and situation, and review of patient completed daily inventory. Clinician utilized active listening and empathetic response and validated patient emotions. Clinician facilitated processing group on pertinent issues.?    Summary: Patient arrived within time allowed. Patient rates her mood at a 5 on a scale of 1-10 with 10 being best. Pt reports feeling "really frustrated." Pt states she got up early to start new morning routine and it didn't go as planned. Pt states visiting her  daughter yesterday and feeling "relived and nervous." Pt reports sleeping better last night.  Patient able to process. Patient engaged in discussion.          Session Time: 10:00 am - 11:00 am   Participation Level: Active   Behavioral Response: CasualAlertDepressed   Type of Therapy: Group Therapy   Treatment Goals addressed: Coping   Progress Towards Goals: Progressing   Interventions: CBT, DBT, Solution Focused, Strength-based, Supportive, and Reframing   Therapist Response: Cln facilitated processing group around difficult relationships. Group members shared struggles they face in relationships. Cln brought in topics of self-esteem, CBT thought challenging, boundaries, and communication to aid growth.   Therapist Response: Pt engaged in discussion and is able to process.         Session Time: 11:00 -12:00   Participation Level: Active   Behavioral Response: CasualAlertDepressed   Type of Therapy: Group Therapy   Treatment Goals addressed: Coping   Progress Towards Goals: Progressing   Interventions: CBT, DBT, Solution Focused, Strength-based, Supportive, and Reframing   Summary: Cln led discussion on impulsivity and how to manage it. Group members discussed struggles they experience with impulsivity and the consequences that have occurred. Cln explained the difference between impulsivity and unthoughtful decision making and how to create space between thought and action.    Therapist Response:  Pt engaged in discussion and is able to identify areas in which impulsivity is an issue.         Session Time: 12:00 -1:00   Participation Level: Active   Behavioral Response: CasualAlertDepressed   Type of Therapy: Group therapy, Occupational Therapy   Treatment Goals addressed: Coping   Progress Towards Goals: Progressing   Interventions:  Supportive; Psychoeducation   Summary: 12:00 - 12:50: Occupational Therapy group led by cln E. Hollan. 12:50 - 1:00  Clinician assessed for immediate needs, medication compliance and efficacy, and safety concerns.   Therapist Response: 12:00 - 12:50: Pt participated 12:50 - 1:00 pm: At check-out, patient reports no immediate concerns. Patient demonstrates progress as evidenced by engagement in group and continued receptiveness to treatment. Patient denies SI/HI/self-harm thoughts at the end of group.      Suicidal/Homicidal: Nowithout intent/plan  Plan: Pt will continue in PHP while working to decrease depression and trauma symptoms, increase emotion regulation symptoms, and increase ability to manage symptoms in a healthy manner.   Collaboration of Care: Medication Management AEB V Doda  Patient/Guardian was advised Release of Information must be obtained prior to any record release in order to collaborate their care with an outside provider. Patient/Guardian was advised if they have not already done so to contact the registration department to sign all necessary forms in order for [place] to release information regarding their care.   Consent: Patient/Guardian gives verbal consent for treatment and assignment of benefits for services provided during this visit. Patient/Guardian expressed understanding and agreed to proceed.   Diagnosis: Severe episode of recurrent major depressive disorder, without psychotic features ([place]) [F33.2]    1. Severe episode of recurrent major depressive disorder, without psychotic features ([place])   2. Posttraumatic stress disorder      Lorin Glass, LCSW

## 2022-02-10 NOTE — Psych (Signed)
Virtual Visit via Video Note  I connected with Reylynn C Louro on 11/13/21 at  9:00 AM EDT by a video enabled telemedicine application and verified that I am speaking with the correct person using two identifiers.  Location: Patient: patient home Provider: clinical home office   I discussed the limitations of evaluation and management by telemedicine and the availability of in person appointments. The patient expressed understanding and agreed to proceed.  I discussed the assessment and treatment plan with the patient. The patient was provided an opportunity to ask questions and all were answered. The patient agreed with the plan and demonstrated an understanding of the instructions.   The patient was advised to call back or seek an in-person evaluation if the symptoms worsen or if the condition fails to improve as anticipated.  Pt was provided 240 minutes of non-face-to-face time during this encounter.   Lorin Glass, LCSW   [facility] [place] PHP THERAPIST PROGRESS NOTE  JAMEY DEMCHAK [id number]  Session Time: 9:00 - 10:00  Participation Level: Active  Behavioral Response: CasualAlertDepressed  Type of Therapy: Group Therapy  Treatment Goals addressed: Coping  Progress Towards Goals: Initial  Interventions: CBT, DBT, Supportive, and Reframing  Summary: Kellin Fifer Delay is a 31 y.o. female who presents with depression, PTSD, and mood symptoms.  Clinician led check-in regarding current stressors and situation, and review of patient completed daily inventory. Clinician utilized active listening and empathetic response and validated patient emotions. Clinician facilitated processing group on pertinent issues.?    Summary: Patient arrived within time allowed. Patient rates her mood at a 5 on a scale of 1-10 with 10 being best. Pt reports she is going between happy and sad without notice. Pt states she slept okay and has been up since 5am and crocheted.  Patient able to  process. Patient engaged in discussion.          Session Time: 10:00 am - 11:00 am   Participation Level: Active   Behavioral Response: CasualAlertDepressed   Type of Therapy: Group Therapy   Treatment Goals addressed: Coping   Progress Towards Goals: Progressing   Interventions: CBT, DBT, Solution Focused, Strength-based, Supportive, and Reframing   Therapist Response: Cln led discussion on the 5 stages of grief and the way loss affects [place]. Cln encouraged pt to consider grief as a journey to accepting a new future. Cln created space for pt to process grief concerns and validated pt's experiences.     Therapist Response: Pt engaged in discussion and was able to identify areas of loss and process.          Session Time: 11:00 -12:00   Participation Level: Active   Behavioral Response: CasualAlertDepressed   Type of Therapy: Group Therapy, Spiritual Care   Treatment Goals addressed: Coping   Progress Towards Goals: Progressing   Interventions: Supportive, Education   Summary:  Alain Marion, Chaplain, led group.   Therapist Response: Pt participated           Session Time: 12:00 -1:00   Participation Level: Active   Behavioral Response: CasualAlertDepressed   Type of Therapy: Group therapy, Occupational Therapy   Treatment Goals addressed: Coping   Progress Towards Goals: Progressing   Interventions: Supportive; Psychoeducation   Summary: 12:00 - 12:50: Occupational Therapy group led by cln E. Hollan. 12:50 - 1:00 Clinician assessed for immediate needs, medication compliance and efficacy, and safety concerns.   Therapist Response: 12:00 - 12:50: Pt participated 12:50 - 1:00 pm: At check-out, patient reports no immediate  concerns. Patient demonstrates progress as evidenced by engagement in group and continued receptiveness to treatment. Patient denies SI/HI/self-harm thoughts at the end of group.      Suicidal/Homicidal: Nowithout intent/plan  Plan:  Pt will continue in PHP while working to decrease depression and trauma symptoms, increase emotion regulation symptoms, and increase ability to manage symptoms in a healthy manner.   Collaboration of Care: Medication Management AEB V Doda  Patient/Guardian was advised Release of Information must be obtained prior to any record release in order to collaborate their care with an outside provider. Patient/Guardian was advised if they have not already done so to contact the registration department to sign all necessary forms in order for [place] to release information regarding their care.   Consent: Patient/Guardian gives verbal consent for treatment and assignment of benefits for services provided during this visit. Patient/Guardian expressed understanding and agreed to proceed.   Diagnosis: Severe episode of recurrent major depressive disorder, without psychotic features ([place]) [F33.2]    1. Severe episode of recurrent major depressive disorder, without psychotic features ([place])   2. Posttraumatic stress disorder      Lorin Glass, LCSW

## 2022-02-10 NOTE — Psych (Signed)
Virtual Visit via Video Note  I connected with Krystal [name] on 11/19/21 at  9:00 AM EDT by a video enabled telemedicine application and verified that I am speaking with the correct person using two identifiers.  Location: Patient: patient home Provider: clinical home office   I discussed the limitations of evaluation and management by telemedicine and the availability of in person appointments. The patient expressed understanding and agreed to proceed.  I discussed the assessment and treatment plan with the patient. The patient was provided an opportunity to ask questions and all were answered. The patient agreed with the plan and demonstrated an understanding of the instructions.   The patient was advised to call back or seek an in-person evaluation if the symptoms worsen or if the condition fails to improve as anticipated.  Pt was provided 240 minutes of non-face-to-face time during this encounter.   Lorin Glass, LCSW   [facility] [place] PHP THERAPIST PROGRESS NOTE  Krystal [name] [id number]  Session Time: 9:00 - 10:00  Participation Level: Active  Behavioral Response: CasualAlertDepressed  Type of Therapy: Group Therapy  Treatment Goals addressed: Coping  Progress Towards Goals: Progressing  Interventions: CBT, DBT, Supportive, and Reframing  Summary: Krystal [name] is a 31 y.o. female who presents with depression, PTSD, and mood symptoms.  Clinician led check-in regarding current stressors and situation, and review of patient completed daily inventory. Clinician utilized active listening and empathetic response and validated patient emotions. Clinician facilitated processing group on pertinent issues.?    Summary: Patient arrived within time allowed. Patient rates her mood at a -2 on a scale of 1-10 with 10 being best. Pt reports she had a "horrible day yesterday." Pt states feeling "horribly" depressed and her anxiety is "through the roof." Pt reports concerns  re: her child in the NICU and feeling as though she is ignored by the doctors. Pt reports struggling with spiraling. Pt identifies passive SI and denies plan and intent. Patient able to process. Patient engaged in discussion.          Session Time: 10:00 am - 11:00 am   Participation Level: Active   Behavioral Response: CasualAlertDepressed   Type of Therapy: Group Therapy   Treatment Goals addressed: Coping   Progress Towards Goals: Progressing   Interventions: CBT, DBT, Solution Focused, Strength-based, Supportive, and Reframing   Therapist Response:  Cln introduced topic of stress management and the model of the "4 A's of stress management:" avoid, alter, accept, and adapt. Group members worked through Advice worker and discussed barriers to utilizing the 4 A's for stressors.   Therapist Response:  Pt engaged in discussion and reports understanding of how to utilize the 4 A's.          Session Time: 11:00 -12:00   Participation Level: Active   Behavioral Response: CasualAlertDepressed   Type of Therapy: Group Therapy   Treatment Goals addressed: Coping   Progress Towards Goals: Progressing   Interventions: CBT, DBT, Solution Focused, Strength-based, Supportive, and Reframing   Summary: Cln continued topic of DBT distress tolerance skills and the ACCEPTS distraction skill. Group reviewed C-C-E skills and discussed how they can practice them in their every day life.    Therapist Response: Pt engaged in discussion and is able to state ways to apply these skills.          Session Time: 12:00 -1:00   Participation Level: Active   Behavioral Response: CasualAlertDepressed   Type of Therapy: Group therapy, Occupational Therapy   Treatment Goals  addressed: Coping   Progress Towards Goals: Progressing   Interventions: Supportive; Psychoeducation   Summary: 12:00 - 12:50: Occupational Therapy group led by cln E. Hollan. 12:50 - 1:00 Clinician assessed for immediate  needs, medication compliance and efficacy, and safety concerns.   Therapist Response: 12:00 - 12:50: Pt participated 12:50 - 1:00 pm: At check-out, patient reports no immediate concerns. Patient demonstrates progress as evidenced by engagement in group and continued receptiveness to treatment. Patient denies SI/HI/self-harm thoughts at the end of group.      Suicidal/Homicidal: Nowithout intent/plan  Plan: Pt will continue in PHP while working to decrease depression and trauma symptoms, increase emotion regulation symptoms, and increase ability to manage symptoms in a healthy manner.   Collaboration of Care: Medication Management AEB V Doda  Patient/Guardian was advised Release of Information must be obtained prior to any record release in order to collaborate their care with an outside provider. Patient/Guardian was advised if they have not already done so to contact the registration department to sign all necessary forms in order for [place] to release information regarding their care.   Consent: Patient/Guardian gives verbal consent for treatment and assignment of benefits for services provided during this visit. Patient/Guardian expressed understanding and agreed to proceed.   Diagnosis: Severe episode of recurrent major depressive disorder, without psychotic features ([place]) [F33.2]    1. Severe episode of recurrent major depressive disorder, without psychotic features ([place])      Lorin Glass, LCSW

## 2022-02-10 NOTE — Psych (Signed)
Virtual Visit via Video Note  I connected with Krystal [name] on 12/10/21 at  1:20 PM EDT by a video enabled telemedicine application and verified that I am speaking with the correct person using two identifiers.  Location: Patient: patient home Provider: clinical home office   I discussed the limitations of evaluation and management by telemedicine and the availability of in person appointments. The patient expressed understanding and agreed to proceed.  I discussed the assessment and treatment plan with the patient. The patient was provided an opportunity to ask questions and all were answered. The patient agreed with the plan and demonstrated an understanding of the instructions.   The patient was advised to call back or seek an in-person evaluation if the symptoms worsen or if the condition fails to improve as anticipated.  Pt was provided 240 minutes of non-face-to-face time during this encounter.   Lorin Glass, LCSW   [facility] [place] PHP THERAPIST PROGRESS NOTE  Krystal [name] [id number]   Session Time: 9:00 am - 10:00 am  Participation Level: Active  Behavioral Response: CasualAlertAnxious and Depressed  Type of Therapy: Group Therapy  Treatment Goals addressed: Coping  Progress Towards Goals: Progressing  Interventions: CBT, DBT, Solution Focused, Strength-based, Supportive, and Reframing  Therapist Response: Clinician led check-in regarding current stressors and situation, and review of patient completed daily inventory. Clinician utilized active listening and empathetic response and validated patient emotions. Clinician facilitated processing group on pertinent issues.?   Summary: Patient arrived within time allowed. Patient rates her mood at a 4 on a scale of 1-10 with 10 being best. Pt states she is back at home after taking a mental break at her sisters. Pt states things are clam between her and her husband. Pt states sleep is poor and she is eating normally.  Pt identifies passive SI and denies plan and intent. Pt able to process.?Pt engaged in discussion.?      Session Time: 10:00 am - 11:00 am  Participation Level: Active  Behavioral Response: CasualAlertAnxious, Depressed, and tearful  Type of Therapy: Group Therapy  Treatment Goals addressed: Coping  Progress Towards Goals: Progressing  Interventions: CBT, DBT, Solution Focused, Strength-based, Supportive, and Reframing  Therapist Response:  Cln led discussion on positive self-esteem. Group shared struggles they experience with self-esteem. Group highlights difficulty in accepting compliments. Cln encouraged pt's to use checking the facts as a way to challenge the negative thinking which accompanies receiving compliments. Cln suggested pt's consider making a compliment file in which they document positive things people say to/about them to review when they are feeling poorly about themselves.   Summary:  Pt engaged in conversation and shares difficulty in accepting compliments. Pt is able to process and reports being open to starting a compliment file.          Session Time: 11:00 am - 12:00 pm   Participation Level: Active   Behavioral Response: CasualAlertAnxious and Depressed   Type of Therapy: Group Therapy   Treatment Goals addressed: Coping   Progress Towards Goals: Progressing   Interventions: CBT, DBT, Solution Focused, Strength-based, Supportive, and Reframing   Therapist Response: Cln led discussion on planning ahead as a way to mitigate anxiety. Group members shared worries about keeping up good habits when returning to "normal" life post-treatment. Group able to brainstorm ways to build habits now and how they can fit into their life after treatment.   Summary: Pt engaged in discussion and identified ways to plan ahead for anxieties.     Session  Time: 12:00 pm - 1:00 pm  Participation Level: Active  Behavioral Response: CasualAlertAnxious and  Depressed  Type of Therapy: Group Therapy  Treatment Goals addressed: Coping  Progress Towards Goals: Progressing  Interventions: CBT, DBT, Solution Focused, Strength-based, Supportive, and Reframing  Therapist Response: 12:00 - 12:50 pm: See OT note.  12:50 - 1:00 pm: Clinician led check-out. Clinician assessed for immediate needs, medication compliance and efficacy, and safety concerns?  Summary: 12:00 - 12:50 pm: See OT note   12:50 - 1:00 pm: At check-out, patient contracts for safety.?Patient demonstrates progress as evidenced by continued engagement in group and responsiveness to treatment. Patient denies SI/HI/self-harm thoughts at the end of group and agrees to seek help should those thoughts/feelings occur.?   Suicidal/Homicidal: Nowithout intent/plan  Plan: ?Pt will discharge from PHP due to needing to stay out of state with her sister for the week. Pt cannot engage in group while out of state, however reports for safety pt cannot stay at home right now. Pt will f/u with [facility] for outpatient treatment. Pt denies SI/HI at time of discharge.    Collaboration of Care: Medication Management AEB Dr. Rosita Kea  Patient/Guardian was advised Release of Information must be obtained prior to any record release in order to collaborate their care with an outside provider. Patient/Guardian was advised if they have not already done so to contact the registration department to sign all necessary forms in order for [place] to release information regarding their care.   Consent: Patient/Guardian gives verbal consent for treatment and assignment of benefits for services provided during this visit. Patient/Guardian expressed understanding and agreed to proceed.   Diagnosis: Severe episode of recurrent major depressive disorder, without psychotic features ([place]) [F33.2]    1. Severe episode of recurrent major depressive disorder, without psychotic features ([place])   2. Posttraumatic stress disorder   3.  Borderline personality disorder in adult ([place])   4. Anxiety      Lorin Glass, LCSW

## 2022-02-11 NOTE — Psych (Signed)
Virtual Visit via Video Note  I connected with Krystal [name] on 12/12/21 at  1:20 PM EDT by a video enabled telemedicine application and verified that I am speaking with the correct person using two identifiers.  Location: Patient: patient home Provider: clinical home office   I discussed the limitations of evaluation and management by telemedicine and the availability of in person appointments. The patient expressed understanding and agreed to proceed.  I discussed the assessment and treatment plan with the patient. The patient was provided an opportunity to ask questions and all were answered. The patient agreed with the plan and demonstrated an understanding of the instructions.   The patient was advised to call back or seek an in-person evaluation if the symptoms worsen or if the condition fails to improve as anticipated.  Pt was provided 240 minutes of non-face-to-face time during this encounter.   Lorin Glass, LCSW   [facility] [place] PHP THERAPIST PROGRESS NOTE  Krystal [name] [id number]   Session Time: 9:00 am - 10:00 am  Participation Level: Active  Behavioral Response: CasualAlertAnxious and Depressed  Type of Therapy: Group Therapy  Treatment Goals addressed: Coping  Progress Towards Goals: Progressing  Interventions: CBT, DBT, Solution Focused, Strength-based, Supportive, and Reframing  Therapist Response: Clinician led check-in regarding current stressors and situation, and review of patient completed daily inventory. Clinician utilized active listening and empathetic response and validated patient emotions. Clinician facilitated processing group on pertinent issues.?   Summary: Patient arrived within time allowed. Patient rates her mood at a 5 on a scale of 1-10 with 10 being best. Pt states her daughter's surgery was yesterday and was successful. Pt states sleep is poor due to anxiety and she ate 1x yesterday. Pt able to process.?Pt engaged in discussion.?       Session Time: 10:00 am - 11:00 am  Participation Level: Active  Behavioral Response: CasualAlertAnxious, Depressed, and tearful  Type of Therapy: Group Therapy  Treatment Goals addressed: Coping  Progress Towards Goals: Progressing  Interventions: CBT, DBT, Solution Focused, Strength-based, Supportive, and Reframing  Therapist Response: Cln led discussion on control and the way it impacts our lives. Group members shared struggles and worked to identify the way in which control is contributing to the struggle. Cln utilized CBT thought challenging and the Catch-Challenge-Change model to address their control issues.    Summary:   Pt engaged in discussion and is able to determine ways in which control is an issue for them and brainstormed how to address it in a healthy manner.        Session Time: 11:00 am - 12:00 pm   Participation Level: Active   Behavioral Response: CasualAlertAnxious and Depressed   Type of Therapy: Group Therapy   Treatment Goals addressed: Coping   Progress Towards Goals: Progressing   Interventions: CBT, DBT, Solution Focused, Strength-based, Supportive, and Reframing   Therapist Response: Cln introduced topic of boundaries. Cln discussed how boundaries inform our relationships and affect self-esteem and personal agency. Group discussed the three types of boundaries: rigid, porous, and healthy and when each type is most helpful/harmful.    Summary:  Pt engaged in discussion and is able to identify when she has accessed each type.      Session Time: 12:00 pm - 1:00 pm  Participation Level: Active  Behavioral Response: CasualAlertAnxious and Depressed  Type of Therapy: Group Therapy  Treatment Goals addressed: Coping  Progress Towards Goals: Progressing  Interventions: CBT, DBT, Solution Focused, Strength-based, Supportive, and Reframing  Therapist  Response: 12:00 - 12:50 pm: See OT note.  12:50 - 1:00 pm: Clinician led check-out.  Clinician assessed for immediate needs, medication compliance and efficacy, and safety concerns?  Summary: 12:00 - 12:50 pm: Pt participated 12:50 - 1:00 pm: At check-out, patient contracts for safety.?Patient demonstrates progress as evidenced by continued engagement in group and responsiveness to treatment. Patient denies SI/HI/self-harm thoughts at the end of group and agrees to seek help should those thoughts/feelings occur.?   Suicidal/Homicidal: Nowithout intent/plan  Plan: ?Pt will discharge from PHP due to needing to stay out of state with her sister for the week. Pt cannot engage in group while out of state, however reports for safety pt cannot stay at home right now. Pt will f/u with [facility] for outpatient treatment. Pt denies SI/HI at time of discharge.    Collaboration of Care: Medication Management AEB Dr. Rosita Kea  Patient/Guardian was advised Release of Information must be obtained prior to any record release in order to collaborate their care with an outside provider. Patient/Guardian was advised if they have not already done so to contact the registration department to sign all necessary forms in order for [place] to release information regarding their care.   Consent: Patient/Guardian gives verbal consent for treatment and assignment of benefits for services provided during this visit. Patient/Guardian expressed understanding and agreed to proceed.   Diagnosis: Severe episode of recurrent major depressive disorder, without psychotic features ([place]) [F33.2]    1. Severe episode of recurrent major depressive disorder, without psychotic features ([place])   2. Posttraumatic stress disorder      Lorin Glass, LCSW

## 2022-02-28 ENCOUNTER — Telehealth: Payer: Self-pay | Admitting: Cardiology

## 2022-02-28 ENCOUNTER — Other Ambulatory Visit (HOSPITAL_COMMUNITY): Payer: Self-pay | Admitting: Psychiatry

## 2022-02-28 ENCOUNTER — Telehealth (HOSPITAL_COMMUNITY): Payer: Self-pay

## 2022-02-28 DIAGNOSIS — F419 Anxiety disorder, unspecified: Secondary | ICD-10-CM

## 2022-02-28 MED ORDER — CLONAZEPAM 1 MG PO TABS: 1.0000 mg | ORAL_TABLET | Freq: Every day | ORAL | 0 refills | Status: DC | PRN

## 2022-02-28 NOTE — Telephone Encounter (Signed)
Patient is calling in to talk with Dr. Johney Frame or nurse in regards to what medications she needs to refill. Please call back

## 2022-02-28 NOTE — Telephone Encounter (Signed)
Returned a callback to the pt.   Pt started off with discussing her BP medication regimen.  She states she is very anxious and stressed out with life, her children and the baby, the babies upcoming surgery, her BP medications, depression, and how out of control her BP has gotten due to all of this.  She stated she has been doing a poor job with taking her cardiac/BP medications and has not been taking care of herself, relating this all to parenting and her depression.   Pt states her pressure is out of control and she doesn't even no anymore what she should be taking on a daily basis for she states "I am on too many medications to even remember what to take."  She states she stopped taking her coreg because it was exacerbating her asthma.  She never called our office to report this.   She states she was suppose to see [facility] as a new pt in Dec, and missed the appt so now she can't get into see Dr. Oval Linsey until Feb.   She was very tearful and crying on the phone.  She stated she is very overwhelmed with life and her depression.   Asked for some BP readings and she said they have for the last several months been running consistently high, with highest systolic in the 543K.    Asked her if she is taking all her other BP medications other than the coreg.  Went over each of them and how many times a day she should be taking each, and she has NOT been taking them as she should. The only medication she has been consistently taking everyday for her BP is amlodipine 10 mg po daily.  She is not taking her spironolactone, hydralazine TID, or coreg (worsens asthma).  She said she is taking propranolol BID for anxiety.    She states "it's too many to keep track of and way too many bottles to locate."   She said "I can't get my brain organized to sit down with each bottle and take them."  Advised her that there are several pharmacy programs in [place] that will do medicine packs for her at no  charge and all she has to do is take them with no hassle of bottles and remembering which med is which and when to take each.  Advised her that [facility] is great for doing this service for their patients and they can even mail them to her if she wanted this.  She said she will look into that.   Pt states she would like to make an appointment with Dr. Johney Frame next week and sit down to discuss her BP management going forth.  Made the pt an appt with Dr. Johney Frame for next Wednesday 03/05/22 at [place].  She is aware to arrive 15 mins prior to this appt.  Reiterated to her that this is very important that she show up for the appt to get BP concerns addressed.  Advised her to bring her BP recordings to this visit if she can.  Advised her to go ahead NOW and take her spironolactone, first dose of hydralazine, and amlodipine.  Advised her to make sure to take the other 2 doses of hydralazine as directed.  Reiterated to her that medication compliance is key to her health as a whole, as well as keeping her scheduled visits with our office.    Pt verbalized understanding and agrees with this plan.  She  states she will try to do better with taking all of her meds, especially her blood pressure medications.    Will make Dr. Johney Frame aware of this message and plan to see the pt in clinic next Wednesday.

## 2022-02-28 NOTE — Telephone Encounter (Signed)
Patient called to request a refill for the following medication  Last visit 01/07/22 Next visit 03/31/22    Disp Refills Start End   clonazePAM (KLONOPIN) 1 MG tablet 30 tablet 0 01/07/2022 01/07/2023   Sig - Route: Take 1 tablet (1 mg total) by mouth daily as needed for anxiety. - Oral   Sent to pharmacy as: clonazePAM (KLONOPIN) 1 MG tablet   E-Prescribing Status: Receipt confirmed by pharmacy (01/07/2022  4:25 PM EST)

## 2022-03-02 ENCOUNTER — Emergency Department (HOSPITAL_BASED_OUTPATIENT_CLINIC_OR_DEPARTMENT_OTHER): Payer: Medicaid Other

## 2022-03-02 ENCOUNTER — Encounter (HOSPITAL_BASED_OUTPATIENT_CLINIC_OR_DEPARTMENT_OTHER): Payer: Self-pay

## 2022-03-02 ENCOUNTER — Other Ambulatory Visit: Payer: Self-pay

## 2022-03-02 ENCOUNTER — Emergency Department (HOSPITAL_BASED_OUTPATIENT_CLINIC_OR_DEPARTMENT_OTHER)
Admission: EM | Admit: 2022-03-02 | Discharge: 2022-03-02 | Disposition: A | Discharge status: Home / Self Care | Payer: Medicaid Other | Attending: Emergency Medicine | Admitting: Emergency Medicine

## 2022-03-02 DIAGNOSIS — R112 Nausea with vomiting, unspecified: Secondary | ICD-10-CM | POA: Diagnosis not present

## 2022-03-02 DIAGNOSIS — N3000 Acute cystitis without hematuria: Secondary | ICD-10-CM

## 2022-03-02 DIAGNOSIS — Z79899 Other long term (current) drug therapy: Secondary | ICD-10-CM | POA: Insufficient documentation

## 2022-03-02 DIAGNOSIS — J45909 Unspecified asthma, uncomplicated: Secondary | ICD-10-CM | POA: Insufficient documentation

## 2022-03-02 DIAGNOSIS — R1033 Periumbilical pain: Secondary | ICD-10-CM | POA: Diagnosis present

## 2022-03-02 DIAGNOSIS — I10 Essential (primary) hypertension: Secondary | ICD-10-CM | POA: Diagnosis not present

## 2022-03-02 LAB — PREGNANCY, URINE: Preg Test, Ur: NEGATIVE

## 2022-03-02 LAB — CBC WITH DIFFERENTIAL/PLATELET
Abs Immature Granulocytes: 0.03 10*3/uL (ref 0.00–0.07)
Basophils Absolute: 0 10*3/uL (ref 0.0–0.1)
Basophils Relative: 0 %
Eosinophils Absolute: 0 10*3/uL (ref 0.0–0.5)
Eosinophils Relative: 0 %
HCT: 41.2 % (ref 36.0–46.0)
Hemoglobin: 14.1 g/dL (ref 12.0–15.0)
Immature Granulocytes: 0 %
Lymphocytes Relative: 29 %
Lymphs Abs: 2.2 10*3/uL (ref 0.7–4.0)
MCH: 32.4 pg (ref 26.0–34.0)
MCHC: 34.2 g/dL (ref 30.0–36.0)
MCV: 94.7 fL (ref 80.0–100.0)
Monocytes Absolute: 0.6 10*3/uL (ref 0.1–1.0)
Monocytes Relative: 8 %
Neutro Abs: 4.8 10*3/uL (ref 1.7–7.7)
Neutrophils Relative %: 63 %
Platelets: 285 10*3/uL (ref 150–400)
RBC: 4.35 MIL/uL (ref 3.87–5.11)
RDW: 12.5 % (ref 11.5–15.5)
WBC: 7.6 10*3/uL (ref 4.0–10.5)
nRBC: 0 % (ref 0.0–0.2)

## 2022-03-02 LAB — URINALYSIS, ROUTINE W REFLEX MICROSCOPIC
Bacteria, UA: NONE SEEN
Bilirubin Urine: NEGATIVE
Glucose, UA: NEGATIVE mg/dL
Hgb urine dipstick: NEGATIVE
Ketones, ur: NEGATIVE mg/dL
Nitrite: NEGATIVE
Protein, ur: 100 mg/dL — AB
Specific Gravity, Urine: 1.025 (ref 1.005–1.030)
WBC, UA: >50 WBC/hpf — ABNORMAL HIGH (ref 0–5)
pH: 7.5 (ref 5.0–8.0)

## 2022-03-02 LAB — LIPASE, BLOOD: Lipase: 10 U/L — ABNORMAL LOW (ref 11–51)

## 2022-03-02 LAB — COMPREHENSIVE METABOLIC PANEL
ALT: 16 U/L (ref 0–44)
AST: 16 U/L (ref 15–41)
Albumin: 4.4 g/dL (ref 3.5–5.0)
Alkaline Phosphatase: 46 U/L (ref 38–126)
Anion gap: 10 (ref 5–15)
BUN: 14 mg/dL (ref 6–20)
CO2: 27 mmol/L (ref 22–32)
Calcium: 9.3 mg/dL (ref 8.9–10.3)
Chloride: 102 mmol/L (ref 98–111)
Creatinine, Ser: 0.93 mg/dL (ref 0.44–1.00)
GFR, Estimated: >60 mL/min (ref >60)
Glucose, Bld: 86 mg/dL (ref 70–99)
Potassium: 3.8 mmol/L (ref 3.5–5.1)
Sodium: 139 mmol/L (ref 135–145)
Total Bilirubin: 0.4 mg/dL (ref 0.3–1.2)
Total Protein: 6.9 g/dL (ref 6.5–8.1)

## 2022-03-02 MED ORDER — ONDANSETRON HCL 4 MG/2ML IJ SOLN
4.0000 mg | Freq: Once | INTRAMUSCULAR | Status: AC
  Administered 2022-03-02: 4 mg via INTRAVENOUS
  Filled 2022-03-02: qty 2

## 2022-03-02 MED ORDER — ONDANSETRON 4 MG PO TBDP: 4.0000 mg | ORAL_TABLET | Freq: Three times a day (TID) | ORAL | 0 refills | Status: DC | PRN

## 2022-03-02 MED ORDER — MORPHINE SULFATE (PF) 4 MG/ML IV SOLN
4.0000 mg | Freq: Once | INTRAVENOUS | Status: AC
  Administered 2022-03-02: 4 mg via INTRAVENOUS
  Filled 2022-03-02: qty 1

## 2022-03-02 MED ORDER — NAPROXEN 500 MG PO TABS: 500.0000 mg | ORAL_TABLET | Freq: Two times a day (BID) | ORAL | 0 refills | Status: DC

## 2022-03-02 MED ORDER — CEPHALEXIN 250 MG PO CAPS: 250.0000 mg | ORAL_CAPSULE | Freq: Four times a day (QID) | ORAL | 0 refills | Status: DC

## 2022-03-02 MED ORDER — HYDRALAZINE HCL 25 MG PO TABS
100.0000 mg | ORAL_TABLET | Freq: Once | ORAL | Status: AC
  Administered 2022-03-02: 100 mg via ORAL
  Filled 2022-03-02: qty 4

## 2022-03-02 MED ORDER — IOHEXOL 300 MG/ML  SOLN
100.0000 mL | Freq: Once | INTRAMUSCULAR | Status: AC | PRN
  Administered 2022-03-02: 100 mL via INTRAVENOUS

## 2022-03-02 MED ORDER — SODIUM CHLORIDE 0.9 % IV BOLUS
1000.0000 mL | Freq: Once | INTRAVENOUS | Status: AC
  Administered 2022-03-02: 1000 mL via INTRAVENOUS

## 2022-03-02 MED ORDER — OXYCODONE-ACETAMINOPHEN 5-325 MG PO TABS: 1.0000 | ORAL_TABLET | Freq: Four times a day (QID) | ORAL | 0 refills | Status: DC | PRN

## 2022-03-02 NOTE — ED Triage Notes (Signed)
Pt arrives POV from home with c/o bladder pain for nearly 3 months off and on.  Saw NP at [facility] on Friday, was diazepam 5 mg to use vaginally and Oxycodone for pain.  States Diazepam is not helping and Oxy makes her vomit, she is unable to keep BP meds down.  Reports sharp, stabbing pain and burning with urination, does not see any blood in urine.  Denies fever.  Pt anxious and tearful in triage.

## 2022-03-02 NOTE — ED Notes (Signed)
Patient states blurred vision in R eye

## 2022-03-02 NOTE — ED Provider Notes (Signed)
[facility] EMERGENCY DEPT Provider Note   CSN: [id number] Arrival date & time: 03/02/22  1114     History {Add pertinent medical, surgical, social history, OB history to HPI:1} Chief Complaint  Patient presents with   Abdominal Pain   Hypertension    Krystal [name] is a 32 y.o. female with a past medical history of anxiety, asthma, kidney stone, hypertension, UTI presenting to the emergency room for evaluation of abdominal pain and dysuria.  Patient reports that she has had worsening abdominal pain for 7 days.  The pain is located in the periumbilical area.  She reports increased urinary frequency and urgency with itching and burning sensation with urination.  She reports she was recently seen by urologist who gave her oxycodone for pain and give her diazepam to use vaginally with no improvement of the pain.  No fever, chest pain, shortness of breath.  Patient also reports nausea and vomiting since yesterday.  She has not been able to keep her medication down which leads to high blood pressure.  She reports trouble seeing out of her right eye with blurry vision.   Abdominal Pain Hypertension Associated symptoms include abdominal pain.    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   PTSD (post-traumatic stress disorder)    Severe uncontrolled hypertension 10/08/2017   UTI (urinary tract infection)    Past Surgical History:  Procedure Laterality Date   APPENDECTOMY     BIOPSY  05/10/2020   Procedure: BIOPSY;  Surgeon: Arta Silence, MD;  Location: [facility];  Service: Endoscopy;;    CESAREAN SECTION N/A 12/06/2017   Procedure: CESAREAN SECTION;  Surgeon: Sloan Leiter, MD;  Location: [facility];  Service: Obstetrics;  Laterality: N/A;   CESAREAN SECTION  07/21/2021   Procedure: CESAREAN SECTION;  Surgeon: Donnamae Jude, MD;  Location: [facility];  Service: Obstetrics;;   CHOLECYSTECTOMY N/A 05/11/2020   Procedure: SINGLE SITE LAPAROSCOPIC CHOLECYSTECTOMY AND LIVER BIOSY;  Surgeon: Michael Boston, MD;  Location: [facility];  Service: General;  Laterality: N/A;   ESOPHAGOGASTRODUODENOSCOPY (EGD) WITH PROPOFOL N/A 05/10/2020   Procedure: ESOPHAGOGASTRODUODENOSCOPY (EGD) WITH PROPOFOL;  Surgeon: Arta Silence, MD;  Location: [facility];  Service: Endoscopy;  Laterality: N/A;   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 Authorizing Provider  cephALEXin (KEFLEX) 250 MG capsule Take 1 capsule (250 mg total) by mouth 4 (four) times daily. 03/02/22  Yes Rex Kras, PA  naproxen (NAPROSYN) 500 MG tablet Take 1 tablet (500 mg total) by mouth 2 (two) times daily. 03/02/22  Yes Rex Kras, PA  ondansetron (ZOFRAN-ODT) 4 MG disintegrating tablet Take 1 tablet (4 mg total) by mouth every 8 (eight) hours as needed for nausea or vomiting. 03/02/22  Yes Rex Kras, PA  acetaminophen (TYLENOL) 500 MG tablet Take 2 tablets (1,000 mg total) by mouth every 6 (  six) hours as needed. 05/14/20   Bonnielee Haff, MD  albuterol (VENTOLIN HFA) 108 (90 Base) MCG/ACT inhaler Inhale 2 puffs into the lungs every 4 (four) hours as needed for wheezing or shortness of breath (coughing fits). 12/04/21   Garnet Sierras, DO  amLODipine (NORVASC) 10 MG tablet Take 1 tablet (10 mg total) by mouth daily. 05/31/21   Freada Bergeron, MD  amoxicillin-clavulanate (AUGMENTIN) 875-125 MG tablet Take 1 tablet by mouth 2 (two) times daily.  12/04/21   Garnet Sierras, DO  carvedilol (COREG) 25 MG tablet Take 1 tablet (25 mg total) by mouth 2 (two) times daily. 09/19/21   Freada Bergeron, MD  cetirizine (ZYRTEC ALLERGY) 10 MG tablet Take 1 tablet (10 mg total) by mouth 2 (two) times daily. 03/26/21   Garnet Sierras, DO  clonazePAM (KLONOPIN) 1 MG tablet Take 1 tablet (1 mg total) by mouth daily as needed for anxiety. 02/28/22 02/28/23  Vista Mink, MD  EPINEPHrine 0.3 mg/0.3 mL IJ SOAJ injection Inject 0.3 mg into the muscle as needed for anaphylaxis. 01/15/21   Garnet Sierras, DO  famotidine (PEPCID) 20 MG tablet Take 1 tablet (20 mg total) by mouth 2 (two) times daily. 12/04/21   Garnet Sierras, DO  hydrALAZINE (APRESOLINE) 100 MG tablet Take 1 tablet (100 mg total) by mouth 3 (three) times daily. 05/14/20   Bonnielee Haff, MD  ibuprofen (ADVIL) 600 MG tablet Take 1 tablet (600 mg total) by mouth every 6 (six) hours as needed for fever, headache or moderate pain. 08/14/21   Genia Del, MD  levothyroxine (SYNTHROID) 125 MCG tablet Take 1 tablet (125 mcg total) by mouth daily before breakfast. 05/24/21   Philemon Kingdom, MD  lurasidone (LATUDA) 20 MG TABS tablet Take 1 tablet (20 mg total) by mouth daily. Take with food (dinner) 01/07/22   Vista Mink, MD  Prenatal Multivit-Min-Fe-FA (PRE-NATAL PO) Take 1 tablet by mouth daily.    [provider]  propranolol (INDERAL) 20 MG tablet Take 1 tablet (20 mg total) by mouth 2 (two) times daily as needed (for anxiety). 01/07/22   Vista Mink, MD  spironolactone (ALDACTONE) 25 MG tablet Take 1 tablet (25 mg total) by mouth daily. 09/19/21   Freada Bergeron, MD  venlafaxine XR (EFFEXOR-XR) 75 MG 24 hr capsule Take 3 capsules (225 mg total) by mouth daily. 01/07/22   Vista Mink, MD      Allergies    Red dye, Compazine [prochlorperazine maleate], Other, Vicodin [hydrocodone-acetaminophen], Amitriptyline hcl, Duloxetine hcl, Lisinopril, Reglan [metoclopramide], and  Nifedipine    Review of Systems   Review of Systems  Gastrointestinal:  Positive for abdominal pain.    Physical Exam Updated Vital Signs BP (!) 182/115 (BP Location: Left Arm)   Pulse 63   Temp 98 F (36.7 C) (Oral)   Resp 16   Ht '5\' 11"'$  (1.803 m)   Wt 115.2 kg   LMP 02/05/2022   SpO2 98%   BMI 35.43 kg/m  Physical Exam Vitals and nursing note reviewed.  Constitutional:      Appearance: Normal appearance. She is ill-appearing.  HENT:     Head: Normocephalic and atraumatic.     Mouth/Throat:     Mouth: Mucous membranes are moist.  Eyes:     General: No scleral icterus. Cardiovascular:     Rate and Rhythm: Normal rate and regular rhythm.     Pulses: Normal pulses.     Heart sounds:  Normal heart sounds.  Pulmonary:     Effort: Pulmonary effort is normal.     Breath sounds: Normal breath sounds.  Abdominal:     General: Abdomen is flat.     Palpations: Abdomen is soft.     Tenderness: There is abdominal tenderness in the periumbilical area and suprapubic area.  Musculoskeletal:        General: No deformity.  Skin:    General: Skin is warm.     Findings: No rash.  Neurological:     General: No focal deficit present.     Mental Status: She is alert.  Psychiatric:        Mood and Affect: Mood normal.     ED Results / Procedures / Treatments   Labs (all labs ordered are listed, but only abnormal results are displayed) Labs Reviewed  URINALYSIS, ROUTINE W REFLEX MICROSCOPIC - Abnormal; Notable for the following components:      Result Value   Protein, ur 100 (*)    Leukocytes,Ua MODERATE (*)    WBC, UA >50 (*)    Non Squamous Epithelial 0-5 (*)    All other components within normal limits  LIPASE, BLOOD - Abnormal; Notable for the following components:   Lipase 10 (*)    All other components within normal limits  PREGNANCY, URINE  CBC WITH DIFFERENTIAL/PLATELET  COMPREHENSIVE METABOLIC PANEL    EKG None  Radiology CT Head Wo Contrast  Result  Date: 03/02/2022 CLINICAL DATA:  Hypertension, vision changes in the right eye. EXAM: CT HEAD WITHOUT CONTRAST TECHNIQUE: Contiguous axial images were obtained from the base of the skull through the vertex without intravenous contrast. RADIATION DOSE REDUCTION: This exam was performed according to the departmental dose-optimization program which includes automated exposure control, adjustment of the mA and/or kV according to patient size and/or use of iterative reconstruction technique. COMPARISON:  Head CT 10/12/2021.  MRI brain 07/12/2021. FINDINGS: Brain: No evidence of acute infarction, hemorrhage, hydrocephalus, extra-axial collection or mass lesion/mass effect. Vascular: No hyperdense vessel or unexpected calcification. Skull: Normal. Negative for fracture or focal lesion. Sinuses/Orbits: No acute finding. Other: None. IMPRESSION: No acute intracranial abnormality. Electronically Signed   By: Ronney Asters M.D.   On: 03/02/2022 16:40   CT ABDOMEN PELVIS W CONTRAST  Result Date: 03/02/2022 CLINICAL DATA:  Left lower quadrant abdominal pain. Right lower quadrant abdominal pain. Lower abdominal pain. EXAM: CT ABDOMEN AND PELVIS WITH CONTRAST TECHNIQUE: Multidetector CT imaging of the abdomen and pelvis was performed using the standard protocol following bolus administration of intravenous contrast. RADIATION DOSE REDUCTION: This exam was performed according to the departmental dose-optimization program which includes automated exposure control, adjustment of the mA and/or kV according to patient size and/or use of iterative reconstruction technique. CONTRAST:  154m OMNIPAQUE IOHEXOL 300 MG/ML  SOLN COMPARISON:  Abdominal ultrasound 07/21/2021. Abdominopelvic CT 05/25/2020. FINDINGS: Lower chest: Clear lung bases. No significant pleural or pericardial effusion. Hepatobiliary: The liver is normal in density without suspicious focal abnormality. No significant biliary dilatation status post cholecystectomy.  Pancreas: Unremarkable. No pancreatic ductal dilatation or surrounding inflammatory changes. Spleen: Stable diffuse lobularity of the spleen without acute abnormality or surrounding fluid collection. Adrenals/Urinary Tract: Both adrenal glands appear normal. No evidence of urinary tract calculus, suspicious renal lesion or hydronephrosis. Stable mild renal cortical lobularity bilaterally. The bladder appears unremarkable for its degree of distention. Stomach/Bowel: No enteric contrast administered. The stomach appears unremarkable for its degree of distension. No evidence of bowel wall thickening, distention or surrounding  inflammatory change. Status post cholecystectomy. Mildly prominent stool in the cecum. Vascular/Lymphatic: There are no enlarged abdominal or pelvic lymph nodes. No significant vascular findings. Reproductive: The uterus and ovaries appear unremarkable. Other: Postsurgical changes in the low anterior abdominal wall. No evidence of hernia, ascites or free air. Musculoskeletal: No acute or significant osseous findings. Stable 2.8 cm sclerotic lesion in the intertrochanteric region of the left proximal femur dating back to 10/12/2015, likely an incidental chondroid lesion. IMPRESSION: 1. No acute findings or explanation for the patient's symptoms. 2. Mildly prominent stool in the cecum. 3. Postsurgical changes as described. Electronically Signed   By: Richardean Sale M.D.   On: 03/02/2022 14:10    Procedures Procedures  {Document cardiac monitor, telemetry assessment procedure when appropriate:1}  Medications Ordered in ED Medications  morphine (PF) 4 MG/ML injection 4 mg (4 mg Intravenous Given 03/02/22 1305)  ondansetron (ZOFRAN) injection 4 mg (4 mg Intravenous Given 03/02/22 1305)  sodium chloride 0.9 % bolus 1,000 mL (0 mLs Intravenous Stopped 03/02/22 1416)  iohexol (OMNIPAQUE) 300 MG/ML solution 100 mL (100 mLs Intravenous Contrast Given 03/02/22 1354)  hydrALAZINE (APRESOLINE) tablet 100  mg (100 mg Oral Given 03/02/22 1529)  morphine (PF) 4 MG/ML injection 4 mg (4 mg Intravenous Given 03/02/22 1528)    ED Course/ Medical Decision Making/ A&P                           Medical Decision Making Amount and/or Complexity of Data Reviewed Labs: ordered. Radiology: ordered.  Risk Prescription drug management.   This patient presents to the ED for lower abdominal pain and dysuria, this involves an extensive number of treatment options, and is a complaint that carries with a high risk of complications and morbidity.  The differential diagnosis includes appendicitis, diverticulitis, diverticulosis, kidney stone, ovarian torsion, ectopic pregnancy, SBO, infectious etiology.  This is not an exhaustive list.  Lab tests: I ordered and personally interpreted labs.  The pertinent results include: WBC unremarkable. Hbg unremarkable. Platelets unremarkable. No electrolyte abnormalities noted.  BUN, creatinine unremarkable. UA significant for leukocytes, white blood cells.  Imaging studies: I ordered imaging studies. I personally reviewed, interpreted imaging and agree with the radiologist's interpretations. The results include: CT abdomen pelvis with contrast showed no acute abnormality.  CT head was negative.  Problem list/ ED course/ Critical interventions/ Medical management: HPI: See above Vital signs within normal range and stable throughout visit. Laboratory/imaging studies significant for: See above. On physical examination, patient is afebrile and appears in no acute distress. This patient presents with symptoms consistent with acute uncomplicated cystitis. No systemic symptoms. Not septic. Low suspicion for acute pyelonephritis given lack of fever, CVAT, or systemic features. Low suspicion for kidney stone or infected stone. Upreg negative so doubt ectopic pregnancy. Low suspicion for ovarian torsion, PID, or appendicitis.  I ordered Zofran for nausea and morphine for pain.  Reevaluation of the patient after these medications showed that the patient improved.  Patient is able to tolerate p.o. at the moment.  Advised patient to follow-up with primary care physician for further evaluation and management.  Return to the ER if new or worsening symptoms.. I have reviewed the patient home medicines and have made adjustments as needed.  Cardiac monitoring/EKG: The patient was maintained on a cardiac monitor.  I personally reviewed and interpreted the cardiac monitor which showed an underlying rhythm of: sinus rhythm.  Additional history obtained: External records from outside source obtained and reviewed  including: Chart review including previous notes, labs, imaging.  Consultations obtained:  Disposition Continued outpatient therapy. Follow-up with PCP recommended for reevaluation of symptoms. Treatment plan discussed with patient.  Pt acknowledged understanding was agreeable to the plan. Worrisome signs and symptoms were discussed with patient, and patient acknowledged understanding to return to the ED if they noticed these signs and symptoms. Patient was stable upon discharge.   This chart was dictated using voice recognition software.  Despite best efforts to proofread,  errors can occur which can change the documentation meaning.    {Document critical care time when appropriate:1} {Document review of labs and clinical decision tools ie heart score, Chads2Vasc2 etc:1}  {Document your independent review of radiology images, and any outside records:1} {Document your discussion with family members, caretakers, and with consultants:1} {Document social determinants of health affecting pt's care:1} {Document your decision making why or why not admission, treatments were needed:1} Final Clinical Impression(s) / ED Diagnoses Final diagnoses:  Acute cystitis without hematuria  Nausea and vomiting, unspecified vomiting type    Rx / DC Orders ED Discharge Orders           Ordered    cephALEXin (KEFLEX) 250 MG capsule  4 times daily        03/02/22 1657    ondansetron (ZOFRAN-ODT) 4 MG disintegrating tablet  Every 8 hours PRN        03/02/22 1700    naproxen (NAPROSYN) 500 MG tablet  2 times daily        03/02/22 1708

## 2022-03-02 NOTE — Discharge Instructions (Addendum)
Please take your medications as prescribed. Please take tylenol/ibuprofen or percocet as needed for pain. Take zofran for nausea. I recommend close follow-up with PCP for reevaluation.  Please do not hesitate to return to emergency department if worrisome signs symptoms we discussed become apparent.

## 2022-03-02 NOTE — Progress Notes (Deleted)
Cardio-Obstetrics Clinic  Follow Up Note   Date:  03/05/2022   ID:  Krystal [name], DOB [birth year], MRN [id number]  PCP:  Patient, No Pcp Per   [facility] Providers Cardiologist:  Meriam Sprague, MD  Electrophysiologist:  None      Referring MD: No ref. provider found   Chief Complaint: HTN  History of Present Illness:    Krystal [name] is a 32 y.o. female [G5P0322] who returns for follow up of HTN.  Patient with extensive history of HTN diagnosed in her teens. Reportedly had secondary work-up at [facility] which was unrevealing (records not available). Also with history of preeclampsia and HELLP syndrome with 2 prior pre-term births at [redacted]w[redacted]d and [redacted]w[redacted]d. She was followed throughout her third pregnancy for difficult to control HTN and persistent nausea and vomiting making it hard to to tolerate PO medications. She ultimately required hospitalization on 06/2021 for severe hypertension, anisocaria and HA. CT/MRI were negative for acute findings. She had a prolonged hospitalization complicated by persistent HTN, nausea/vomiting requiring NGT placement, severe anxiety/depression, and HELLP syndrome. She underwent emergent c-section with bilateral salpingectomy on 07/21/21. She was discharged POD #3.   Was last seen in clinic 08/2021 where her blood pressures remained very elevated. We continued on amlodipine 10mg  daily, hydralazine 100mg  TID, started spironolactone 25mg  and changed labetalol to coreg 25mg  BID. She was referred to Dr. Duke Salvia but did not show up. Also planned to see Pharm D but missed appointments.  Today, ***   Prior CV Studies Reviewed: The following studies were reviewed today: TTE 2021/07/07: IMPRESSIONS   1. Global longitudinal strain is -20%. Left ventricular ejection  fraction, by estimation, is 60 to 65%. The left ventricle has normal  function. The left ventricle has no regional wall motion abnormalities.  There is mild left ventricular hypertrophy.   Left ventricular diastolic parameters were normal.   2. Right ventricular systolic function is normal. The right ventricular  size is normal.   3. Mild mitral valve regurgitation.   4. The aortic valve is tricuspid. Aortic valve regurgitation is not  visualized. Aortic valve sclerosis is present, with no evidence of aortic  valve stenosis.   5. The inferior vena cava is normal in size with greater than 50%  respiratory variability, suggesting right atrial pressure of 3 mmHg.   Past Medical History:  Diagnosis Date   Acute acalculous cholecystitis s/p lap cholecystectomy 05/11/2020 05/11/2020   Angio-edema 01/15/2021   Anxiety    Asthma    Depression    HELLP (hemolytic anemia/elev liver enzymes/low platelets in pregnancy) 12/08/2017   History of borderline personality disorder    IC (interstitial cystitis)    Kidney stones    Migraines    Necrotizing fasciitis (HCC)    left hand amputation   PTSD (post-traumatic stress disorder)    Severe uncontrolled hypertension 10/08/2017   UTI (urinary tract infection)     Past Surgical History:  Procedure Laterality Date   APPENDECTOMY     BIOPSY  05/10/2020   Procedure: BIOPSY;  Surgeon: Willis Modena, MD;  Location: [facility];  Service: Endoscopy;;   CESAREAN SECTION N/A 12/06/2017   Procedure: CESAREAN SECTION;  Surgeon: Conan Bowens, MD;  Location: [facility];  Service: Obstetrics;  Laterality: N/A;   CESAREAN SECTION  07/21/2021   Procedure: CESAREAN SECTION;  Surgeon: Reva Bores, MD;  Location: [facility];  Service: Obstetrics;;   CHOLECYSTECTOMY N/A 05/11/2020   Procedure: SINGLE SITE LAPAROSCOPIC CHOLECYSTECTOMY AND LIVER BIOSY;  Surgeon: Karie Soda, MD;  Location: [facility];  Service: General;  Laterality: N/A;   ESOPHAGOGASTRODUODENOSCOPY (EGD) WITH PROPOFOL N/A 05/10/2020   Procedure: ESOPHAGOGASTRODUODENOSCOPY (EGD) WITH PROPOFOL;  Surgeon: Willis Modena, MD;  Location: [facility];  Service: Endoscopy;   Laterality: N/A;   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   albuterol (VENTOLIN HFA) 108 (90 Base) MCG/ACT inhaler Inhale 2 puffs into the lungs every 4 (four) hours as needed for wheezing or shortness of breath (coughing fits).   amLODipine (NORVASC) 10 MG tablet Take 1 tablet (10 mg total) by mouth daily.   cephALEXin (KEFLEX) 250 MG capsule Take 1 capsule (250 mg total) by mouth 4 (four) times daily.   cetirizine (ZYRTEC ALLERGY) 10 MG tablet Take 1 tablet (10 mg total) by mouth 2 (two) times daily.   clonazePAM (KLONOPIN) 1 MG tablet Take 1 tablet (1 mg total) by mouth daily as needed for anxiety.   EPINEPHrine 0.3 mg/0.3 mL IJ SOAJ injection Inject 0.3 mg into the muscle as needed for anaphylaxis.   famotidine (PEPCID) 20 MG tablet Take 1 tablet (20 mg total) by mouth 2 (two) times daily.   hydrALAZINE (APRESOLINE) 100 MG tablet Take 1 tablet (100 mg total) by mouth 3 (three) times daily.   ibuprofen (ADVIL) 600 MG tablet Take 1 tablet (600 mg total) by mouth every 6 (six) hours as needed for fever, headache or moderate pain.   levothyroxine (SYNTHROID) 125 MCG tablet Take 1 tablet (125 mcg total) by mouth daily before breakfast.   lurasidone (LATUDA) 20 MG TABS tablet Take 1 tablet (20 mg total) by mouth daily. Take with food (dinner)   naproxen (NAPROSYN) 500 MG tablet Take 1 tablet (500 mg total) by mouth 2 (two) times daily.   ondansetron (ZOFRAN-ODT) 4 MG disintegrating tablet Take 1 tablet (4 mg total) by mouth every 8 (eight) hours as  needed for nausea or vomiting.   oxyCODONE-acetaminophen (PERCOCET/ROXICET) 5-325 MG tablet Take 1 tablet by mouth every 6 (six) hours as needed for severe pain.   Prenatal Multivit-Min-Fe-FA (PRE-NATAL PO) Take 1 tablet by mouth daily.   propranolol (INDERAL) 20 MG tablet Take 1 tablet (20 mg total) by mouth 2 (two) times daily as needed (for anxiety).   spironolactone (ALDACTONE) 25 MG tablet Take 1 tablet (25 mg total) by mouth daily.   venlafaxine XR (EFFEXOR-XR) 75 MG 24 hr capsule Take 3 capsules (225 mg total) by mouth daily.   [DISCONTINUED] amoxicillin-clavulanate (AUGMENTIN) 875-125 MG tablet Take 1 tablet by mouth 2 (two) times daily.   [DISCONTINUED] carvedilol (COREG) 25 MG tablet Take 1 tablet (25 mg total) by mouth 2 (two) times daily.     Allergies:   Red dye, Compazine [  prochlorperazine maleate], Other, Vicodin [hydrocodone-acetaminophen], Amitriptyline hcl, Duloxetine hcl, Lisinopril, Reglan [metoclopramide], and Nifedipine   Social History   Socioeconomic History   Marital status: Married    Spouse name: Not on file   Number of children: 3   Years of education: Not on file   Highest education level: 8th grade  Occupational History   Not on file  Tobacco Use   Smoking status: Never   Smokeless tobacco: Never   Tobacco comments:    She tried a cigarette and never smoked again  Vaping Use   Vaping Use: Never used  Substance and Sexual Activity   Alcohol use: Yes    Comment: bottle of wine a week-occ   Drug use: No   Sexual activity: Not Currently  Other Topics Concern   Not on file  Social History Narrative   Not on file   Social Determinants of Health   Financial Resource Strain: Not on file  Food Insecurity: No Food Insecurity (06/13/2021)   Hunger Vital Sign    Worried About Running Out of Food in the Last Year: Never true    Ran Out of Food in the Last Year: Never true  Transportation Needs: Unmet Transportation Needs (06/13/2021)   PRAPARE -  Administrator, Civil Service (Medical): Yes    Lack of Transportation (Non-Medical): Yes  Physical Activity: Not on file  Stress: Not on file  Social Connections: Not on file  {    Family History  Problem Relation Age of Onset   Hypertension Mother    Hypertension Father    {  ROS:   Please see the history of present illness.    All other systems reviewed and are negative.   Labs/EKG Reviewed:    EKG:   EKG is not ordered today.    Recent Labs: 06/10/2021: Magnesium 2.0; NT-Pro BNP 165 06/27/2021: B Natriuretic Peptide 28.9 07/03/2021: TSH 2.154 03/02/2022: ALT 16; BUN 14; Creatinine, Ser 0.93; Hemoglobin 14.1; Platelets 285; Potassium 3.8; Sodium 139   Recent Lipid Panel No results found for: "CHOL", "TRIG", "HDL", "CHOLHDL", "LDLCALC", "LDLDIRECT"  Physical Exam:    VS:  BP (!) 200/121   Pulse 79   Ht 5\' 11"  (1.803 m)   Wt 242 lb 3.2 oz (109.9 kg)   LMP 02/05/2022   SpO2 98%   BMI 33.78 kg/m     Wt Readings from Last 3 Encounters:  03/05/22 242 lb 3.2 oz (109.9 kg)  03/02/22 254 lb (115.2 kg)  12/04/21 254 lb 12.8 oz (115.6 kg)     GEN:  Well nourished, well developed in no acute distress HEENT: Normal NECK: No JVD; No carotid bruits CARDIAC: RRR, 2/6 systolic murmur RESPIRATORY:  Clear to auscultation without rales, wheezing or rhonchi  ABDOMEN: Soft, non-tender, non-distended MUSCULOSKELETAL:  No edema; No deformity  SKIN: Warm and dry NEUROLOGIC:  Alert and oriented x 3 PSYCHIATRIC:  Normal affect    Risk Assessment/Risk Calculators:     ASSESSMENT & PLAN:    #Chronic, Resistant HTN: #History of Pre-Eclampsia and HELLP Syndrome: #Right Eye Vision Loss: Patient with long-standing history of chronic hypertension with history of HELLP syndrome and pre-eclampsia in all 3 of her pregnancies. Continues to have severely elevated blood pressures today in the postpartum period and currently not tolerating PO medications due to vomiting. We  have trialed zofran but she states it is not working. She is also now complaining of right eye vision loss. CT head on 01/07 without acute pathology  but has not had MRI. BP today severely elevated at 200/120. Has HA and persistent vision changes. States she could not tolerate her PO medications this morning as she vomited them up shortly after taking them. Given that she is vomiting up her medications, has persistent vision changes and HA and is not responding to zofran, she has been referred to the ER for further evaluation. She declined transfer by EMS but will obtain a ride to [facility].  -Referred to ER; declined transfer by EMS -Will likely require IV medications for blood pressure control until her nausea improves -Needs MRI head given right eye vision loss and persistent blood pressures >200/120s at home -Suspect anxiety/depression is contributing to some of ongoing nausea/inability to tolerate PO as her daughter is scheduled for open heart surgery on 03/19/22 -Referred to Dr. Duke Salvia for further work-up and management of HTN  -Continue amlodipine 10mg  daily -Continue hydralazine 100mg  TID -Continue coreg 25mg  BID  -Continue spironolactone 25mg  daily -Will likely need further medication adjustment as in-patient  #Anxiety/Depression: Follows closely with psychiatry. Suspect this is contributing to her presentation as above.   .   Patient Instructions  Medication Instructions:   Your physician recommends that you continue on your current medications as directed. Please refer to the Current Medication list given to you today.  *If you need a refill on your cardiac medications before your next appointment, please call your pharmacy*    Follow-Up:  3 MONTHS WITH DR. Shari Prows [facility]ER [facility]    --PLEASE HEAD TO [facility] NOW PER DR. Shari Prows FOR ATTENTION TO UNCONTROLLED BLOOD PRESSURE ISSUES, VISION ISSUE, AND TO HAVE APPROPRIATE TESTING DONE.         Important Information About Sugar         Dispo:  No follow-ups on file.   Medication Adjustments/Labs and Tests Ordered: Current medicines are reviewed at length with the patient today.  Concerns regarding medicines are outlined above.  Tests Ordered: No orders of the defined types were placed in this encounter.  Medication Changes: No orders of the defined types were placed in this encounter.

## 2022-03-04 ENCOUNTER — Ambulatory Visit: Payer: Medicaid Other | Admitting: Family Medicine

## 2022-03-05 ENCOUNTER — Encounter: Payer: Self-pay | Admitting: Cardiology

## 2022-03-05 ENCOUNTER — Emergency Department (HOSPITAL_COMMUNITY): Payer: Medicaid Other

## 2022-03-05 ENCOUNTER — Encounter (HOSPITAL_COMMUNITY): Payer: Self-pay

## 2022-03-05 ENCOUNTER — Ambulatory Visit (INDEPENDENT_AMBULATORY_CARE_PROVIDER_SITE_OTHER): Payer: Medicaid Other | Admitting: Cardiology

## 2022-03-05 ENCOUNTER — Inpatient Hospital Stay (HOSPITAL_COMMUNITY)
Admission: EM | Admit: 2022-03-05 | Discharge: 2022-03-17 | DRG: 305 | Disposition: A | Discharge status: Home / Self Care | Payer: Medicaid Other | Attending: Family Medicine | Admitting: Family Medicine

## 2022-03-05 VITALS — BP 200/121 | HR 79 | Ht 71.0 in | Wt 242.2 lb

## 2022-03-05 DIAGNOSIS — Z79890 Hormone replacement therapy (postmenopausal): Secondary | ICD-10-CM | POA: Diagnosis not present

## 2022-03-05 DIAGNOSIS — K2100 Gastro-esophageal reflux disease with esophagitis, without bleeding: Secondary | ICD-10-CM | POA: Diagnosis present

## 2022-03-05 DIAGNOSIS — I169 Hypertensive crisis, unspecified: Secondary | ICD-10-CM | POA: Diagnosis present

## 2022-03-05 DIAGNOSIS — H348312 Tributary (branch) retinal vein occlusion, right eye, stable: Secondary | ICD-10-CM | POA: Diagnosis present

## 2022-03-05 DIAGNOSIS — F1290 Cannabis use, unspecified, uncomplicated: Secondary | ICD-10-CM | POA: Diagnosis present

## 2022-03-05 DIAGNOSIS — E038 Other specified hypothyroidism: Secondary | ICD-10-CM | POA: Diagnosis not present

## 2022-03-05 DIAGNOSIS — K3184 Gastroparesis: Secondary | ICD-10-CM | POA: Diagnosis present

## 2022-03-05 DIAGNOSIS — O10919 Unspecified pre-existing hypertension complicating pregnancy, unspecified trimester: Secondary | ICD-10-CM

## 2022-03-05 DIAGNOSIS — H538 Other visual disturbances: Secondary | ICD-10-CM

## 2022-03-05 DIAGNOSIS — E669 Obesity, unspecified: Secondary | ICD-10-CM | POA: Diagnosis present

## 2022-03-05 DIAGNOSIS — F419 Anxiety disorder, unspecified: Secondary | ICD-10-CM

## 2022-03-05 DIAGNOSIS — F410 Panic disorder [episodic paroxysmal anxiety] without agoraphobia: Secondary | ICD-10-CM | POA: Diagnosis present

## 2022-03-05 DIAGNOSIS — O09899 Supervision of other high risk pregnancies, unspecified trimester: Secondary | ICD-10-CM

## 2022-03-05 DIAGNOSIS — Z98891 History of uterine scar from previous surgery: Secondary | ICD-10-CM

## 2022-03-05 DIAGNOSIS — F4321 Adjustment disorder with depressed mood: Secondary | ICD-10-CM | POA: Diagnosis present

## 2022-03-05 DIAGNOSIS — E039 Hypothyroidism, unspecified: Secondary | ICD-10-CM | POA: Diagnosis present

## 2022-03-05 DIAGNOSIS — Z1152 Encounter for screening for COVID-19: Secondary | ICD-10-CM | POA: Diagnosis not present

## 2022-03-05 DIAGNOSIS — Z885 Allergy status to narcotic agent status: Secondary | ICD-10-CM

## 2022-03-05 DIAGNOSIS — R0902 Hypoxemia: Secondary | ICD-10-CM | POA: Diagnosis not present

## 2022-03-05 DIAGNOSIS — G8929 Other chronic pain: Secondary | ICD-10-CM | POA: Diagnosis present

## 2022-03-05 DIAGNOSIS — H348112 Central retinal vein occlusion, right eye, stable: Secondary | ICD-10-CM | POA: Diagnosis present

## 2022-03-05 DIAGNOSIS — Z811 Family history of alcohol abuse and dependence: Secondary | ICD-10-CM

## 2022-03-05 DIAGNOSIS — O09299 Supervision of pregnancy with other poor reproductive or obstetric history, unspecified trimester: Secondary | ICD-10-CM

## 2022-03-05 DIAGNOSIS — Z8249 Family history of ischemic heart disease and other diseases of the circulatory system: Secondary | ICD-10-CM

## 2022-03-05 DIAGNOSIS — Z8739 Personal history of other diseases of the musculoskeletal system and connective tissue: Secondary | ICD-10-CM

## 2022-03-05 DIAGNOSIS — F411 Generalized anxiety disorder: Secondary | ICD-10-CM | POA: Diagnosis present

## 2022-03-05 DIAGNOSIS — I160 Hypertensive urgency: Secondary | ICD-10-CM | POA: Diagnosis not present

## 2022-03-05 DIAGNOSIS — N179 Acute kidney failure, unspecified: Secondary | ICD-10-CM | POA: Diagnosis present

## 2022-03-05 DIAGNOSIS — Z609 Problem related to social environment, unspecified: Secondary | ICD-10-CM | POA: Diagnosis present

## 2022-03-05 DIAGNOSIS — R112 Nausea with vomiting, unspecified: Secondary | ICD-10-CM | POA: Diagnosis present

## 2022-03-05 DIAGNOSIS — Z821 Family history of blindness and visual loss: Secondary | ICD-10-CM

## 2022-03-05 DIAGNOSIS — N3010 Interstitial cystitis (chronic) without hematuria: Secondary | ICD-10-CM | POA: Diagnosis not present

## 2022-03-05 DIAGNOSIS — N3000 Acute cystitis without hematuria: Secondary | ICD-10-CM

## 2022-03-05 DIAGNOSIS — G4730 Sleep apnea, unspecified: Secondary | ICD-10-CM | POA: Diagnosis present

## 2022-03-05 DIAGNOSIS — F332 Major depressive disorder, recurrent severe without psychotic features: Secondary | ICD-10-CM | POA: Insufficient documentation

## 2022-03-05 DIAGNOSIS — Z6372 Alcoholism and drug addiction in family: Secondary | ICD-10-CM

## 2022-03-05 DIAGNOSIS — Z818 Family history of other mental and behavioral disorders: Secondary | ICD-10-CM

## 2022-03-05 DIAGNOSIS — Z6830 Body mass index (BMI) 30.0-30.9, adult: Secondary | ICD-10-CM | POA: Diagnosis not present

## 2022-03-05 DIAGNOSIS — J45909 Unspecified asthma, uncomplicated: Secondary | ICD-10-CM | POA: Diagnosis present

## 2022-03-05 DIAGNOSIS — I1A0 Resistant hypertension: Principal | ICD-10-CM

## 2022-03-05 DIAGNOSIS — F4310 Post-traumatic stress disorder, unspecified: Secondary | ICD-10-CM | POA: Diagnosis present

## 2022-03-05 DIAGNOSIS — Z7951 Long term (current) use of inhaled steroids: Secondary | ICD-10-CM

## 2022-03-05 DIAGNOSIS — Z733 Stress, not elsewhere classified: Secondary | ICD-10-CM

## 2022-03-05 DIAGNOSIS — I161 Hypertensive emergency: Principal | ICD-10-CM | POA: Diagnosis present

## 2022-03-05 DIAGNOSIS — F603 Borderline personality disorder: Secondary | ICD-10-CM | POA: Diagnosis present

## 2022-03-05 DIAGNOSIS — Z79899 Other long term (current) drug therapy: Secondary | ICD-10-CM | POA: Diagnosis not present

## 2022-03-05 DIAGNOSIS — Z888 Allergy status to other drugs, medicaments and biological substances status: Secondary | ICD-10-CM

## 2022-03-05 HISTORY — DX: Hypertensive crisis, unspecified: I16.9

## 2022-03-05 LAB — CBC WITH DIFFERENTIAL/PLATELET
Abs Immature Granulocytes: 0.03 10*3/uL (ref 0.00–0.07)
Basophils Absolute: 0 10*3/uL (ref 0.0–0.1)
Basophils Relative: 0 %
Eosinophils Absolute: 0 10*3/uL (ref 0.0–0.5)
Eosinophils Relative: 0 %
HCT: 46.4 % — ABNORMAL HIGH (ref 36.0–46.0)
Hemoglobin: 16.1 g/dL — ABNORMAL HIGH (ref 12.0–15.0)
Immature Granulocytes: 0 %
Lymphocytes Relative: 29 %
Lymphs Abs: 2.4 10*3/uL (ref 0.7–4.0)
MCH: 32.8 pg (ref 26.0–34.0)
MCHC: 34.7 g/dL (ref 30.0–36.0)
MCV: 94.5 fL (ref 80.0–100.0)
Monocytes Absolute: 0.8 10*3/uL (ref 0.1–1.0)
Monocytes Relative: 10 %
Neutro Abs: 5.1 10*3/uL (ref 1.7–7.7)
Neutrophils Relative %: 61 %
Platelets: 314 10*3/uL (ref 150–400)
RBC: 4.91 MIL/uL (ref 3.87–5.11)
RDW: 12.2 % (ref 11.5–15.5)
WBC: 8.4 10*3/uL (ref 4.0–10.5)
nRBC: 0 % (ref 0.0–0.2)

## 2022-03-05 LAB — COMPREHENSIVE METABOLIC PANEL
ALT: 21 U/L (ref 0–44)
AST: 23 U/L (ref 15–41)
Albumin: 4.7 g/dL (ref 3.5–5.0)
Alkaline Phosphatase: 54 U/L (ref 38–126)
Anion gap: 11 (ref 5–15)
BUN: 17 mg/dL (ref 6–20)
CO2: 29 mmol/L (ref 22–32)
Calcium: 9.5 mg/dL (ref 8.9–10.3)
Chloride: 101 mmol/L (ref 98–111)
Creatinine, Ser: 0.79 mg/dL (ref 0.44–1.00)
GFR, Estimated: >60 mL/min (ref >60)
Glucose, Bld: 97 mg/dL (ref 70–99)
Potassium: 3.8 mmol/L (ref 3.5–5.1)
Sodium: 141 mmol/L (ref 135–145)
Total Bilirubin: 0.5 mg/dL (ref 0.3–1.2)
Total Protein: 8.1 g/dL (ref 6.5–8.1)

## 2022-03-05 LAB — URINALYSIS, ROUTINE W REFLEX MICROSCOPIC
Bacteria, UA: NONE SEEN
Bilirubin Urine: NEGATIVE
Glucose, UA: NEGATIVE mg/dL
Hgb urine dipstick: NEGATIVE
Ketones, ur: 5 mg/dL — AB
Leukocytes,Ua: NEGATIVE
Nitrite: NEGATIVE
Protein, ur: 100 mg/dL — AB
Specific Gravity, Urine: 1.015 (ref 1.005–1.030)
pH: 8 (ref 5.0–8.0)

## 2022-03-05 LAB — MRSA NEXT GEN BY PCR, NASAL: MRSA by PCR Next Gen: NOT DETECTED

## 2022-03-05 LAB — RESP PANEL BY RT-PCR (RSV, FLU A&B, COVID)  RVPGX2
Influenza A by PCR: NEGATIVE
Influenza B by PCR: NEGATIVE
Resp Syncytial Virus by PCR: NEGATIVE
SARS Coronavirus 2 by RT PCR: NEGATIVE

## 2022-03-05 LAB — TROPONIN I (HIGH SENSITIVITY)
Troponin I (High Sensitivity): 8 ng/L (ref <18)
Troponin I (High Sensitivity): 23 ng/L — ABNORMAL HIGH (ref <18)

## 2022-03-05 LAB — CREATININE, SERUM
Creatinine, Ser: 0.84 mg/dL (ref 0.44–1.00)
GFR, Estimated: >60 mL/min (ref >60)

## 2022-03-05 LAB — LIPASE, BLOOD: Lipase: 30 U/L (ref 11–51)

## 2022-03-05 MED ORDER — NICARDIPINE HCL IN NACL 20-0.86 MG/200ML-% IV SOLN
3.0000 mg/h | INTRAVENOUS | Status: DC
  Administered 2022-03-05: 12.5 mg/h via INTRAVENOUS
  Administered 2022-03-05: 1 mg/h via INTRAVENOUS
  Administered 2022-03-05: 8 mg/h via INTRAVENOUS
  Administered 2022-03-05: 5 mg/h via INTRAVENOUS
  Administered 2022-03-05: 3 mg/h via INTRAVENOUS
  Administered 2022-03-06: 5 mg/h via INTRAVENOUS
  Administered 2022-03-06: 7.5 mg/h via INTRAVENOUS
  Administered 2022-03-06: 3 mg/h via INTRAVENOUS
  Administered 2022-03-06: 7.5 mg/h via INTRAVENOUS
  Filled 2022-03-05 (×12): qty 200

## 2022-03-05 MED ORDER — GADOBUTROL 1 MMOL/ML IV SOLN
10.0000 mL | Freq: Once | INTRAVENOUS | Status: AC | PRN
  Administered 2022-03-05: 10 mL via INTRAVENOUS

## 2022-03-05 MED ORDER — CLONAZEPAM 0.125 MG PO TBDP
0.5000 mg | ORAL_TABLET | Freq: Three times a day (TID) | ORAL | Status: DC | PRN
  Administered 2022-03-06 – 2022-03-07 (×3): 0.5 mg via ORAL
  Filled 2022-03-05 (×3): qty 4

## 2022-03-05 MED ORDER — ONDANSETRON HCL 4 MG/2ML IJ SOLN
4.0000 mg | Freq: Once | INTRAMUSCULAR | Status: AC
  Administered 2022-03-05: 4 mg via INTRAVENOUS
  Filled 2022-03-05: qty 2

## 2022-03-05 MED ORDER — FENTANYL CITRATE PF 50 MCG/ML IJ SOSY
12.5000 ug | PREFILLED_SYRINGE | INTRAMUSCULAR | Status: AC | PRN
  Administered 2022-03-05 – 2022-03-06 (×2): 12.5 ug via INTRAVENOUS
  Filled 2022-03-05 (×2): qty 1

## 2022-03-05 MED ORDER — ORAL CARE MOUTH RINSE: 15.0000 mL | OROMUCOSAL | Status: DC | PRN

## 2022-03-05 MED ORDER — ALBUTEROL SULFATE (2.5 MG/3ML) 0.083% IN NEBU: 2.5000 mg | INHALATION_SOLUTION | RESPIRATORY_TRACT | Status: DC | PRN

## 2022-03-05 MED ORDER — POLYETHYLENE GLYCOL 3350 17 G PO PACK
17.0000 g | PACK | Freq: Every day | ORAL | Status: DC | PRN
  Administered 2022-03-08 – 2022-03-09 (×2): 17 g via ORAL
  Filled 2022-03-05 (×2): qty 1

## 2022-03-05 MED ORDER — MORPHINE SULFATE (PF) 4 MG/ML IV SOLN
4.0000 mg | Freq: Once | INTRAVENOUS | Status: AC
  Administered 2022-03-05: 4 mg via INTRAVENOUS
  Filled 2022-03-05: qty 1

## 2022-03-05 MED ORDER — PANTOPRAZOLE SODIUM 40 MG PO TBEC: 40.0000 mg | DELAYED_RELEASE_TABLET | Freq: Every day | ORAL | Status: DC

## 2022-03-05 MED ORDER — LEVOTHYROXINE SODIUM 125 MCG PO TABS
125.0000 ug | ORAL_TABLET | Freq: Every day | ORAL | Status: DC
  Administered 2022-03-06 – 2022-03-17 (×11): 125 ug via ORAL
  Filled 2022-03-05 (×11): qty 1

## 2022-03-05 MED ORDER — HYDRALAZINE HCL 50 MG PO TABS
100.0000 mg | ORAL_TABLET | Freq: Three times a day (TID) | ORAL | Status: DC
  Administered 2022-03-05 – 2022-03-06 (×3): 100 mg via ORAL
  Filled 2022-03-05: qty 4
  Filled 2022-03-05 (×3): qty 2

## 2022-03-05 MED ORDER — LORAZEPAM 2 MG/ML IJ SOLN
1.0000 mg | Freq: Once | INTRAMUSCULAR | Status: AC | PRN
  Administered 2022-03-05: 1 mg via INTRAVENOUS
  Filled 2022-03-05: qty 1

## 2022-03-05 MED ORDER — SODIUM CHLORIDE 0.9 % IV SOLN
12.5000 mg | Freq: Once | INTRAVENOUS | Status: AC
  Administered 2022-03-05: 12.5 mg via INTRAVENOUS
  Filled 2022-03-05: qty 0.5

## 2022-03-05 MED ORDER — PROPRANOLOL HCL 10 MG PO TABS
20.0000 mg | ORAL_TABLET | Freq: Two times a day (BID) | ORAL | Status: DC
  Administered 2022-03-05 – 2022-03-17 (×23): 20 mg via ORAL
  Filled 2022-03-05: qty 1
  Filled 2022-03-05 (×4): qty 2
  Filled 2022-03-05: qty 1
  Filled 2022-03-05: qty 2
  Filled 2022-03-05 (×2): qty 1
  Filled 2022-03-05 (×4): qty 2
  Filled 2022-03-05: qty 1
  Filled 2022-03-05: qty 2
  Filled 2022-03-05 (×5): qty 1
  Filled 2022-03-05 (×2): qty 2
  Filled 2022-03-05: qty 1

## 2022-03-05 MED ORDER — AMLODIPINE BESYLATE 10 MG PO TABS
10.0000 mg | ORAL_TABLET | Freq: Every day | ORAL | Status: DC
  Administered 2022-03-05 – 2022-03-17 (×13): 10 mg via ORAL
  Filled 2022-03-05 (×2): qty 2
  Filled 2022-03-05 (×2): qty 1
  Filled 2022-03-05: qty 2
  Filled 2022-03-05 (×3): qty 1
  Filled 2022-03-05: qty 2
  Filled 2022-03-05: qty 1
  Filled 2022-03-05 (×3): qty 2

## 2022-03-05 MED ORDER — HYDRALAZINE HCL 20 MG/ML IJ SOLN: 10.0000 mg | Freq: Once | INTRAMUSCULAR | Status: DC

## 2022-03-05 MED ORDER — CHLORHEXIDINE GLUCONATE CLOTH 2 % EX PADS
6.0000 | MEDICATED_PAD | Freq: Every day | CUTANEOUS | Status: DC
  Administered 2022-03-05 – 2022-03-11 (×6): 6 via TOPICAL

## 2022-03-05 MED ORDER — LURASIDONE HCL 20 MG PO TABS: 20.0000 mg | ORAL_TABLET | ORAL | Status: DC

## 2022-03-05 MED ORDER — ONDANSETRON HCL 4 MG/2ML IJ SOLN
4.0000 mg | Freq: Three times a day (TID) | INTRAMUSCULAR | Status: DC | PRN
  Administered 2022-03-05 – 2022-03-06 (×2): 4 mg via INTRAVENOUS
  Filled 2022-03-05 (×2): qty 2

## 2022-03-05 MED ORDER — HEPARIN SODIUM (PORCINE) 5000 UNIT/ML IJ SOLN
5000.0000 [IU] | Freq: Three times a day (TID) | INTRAMUSCULAR | Status: DC
  Administered 2022-03-05 – 2022-03-17 (×37): 5000 [IU] via SUBCUTANEOUS
  Filled 2022-03-05 (×37): qty 1

## 2022-03-05 MED ORDER — CEPHALEXIN 500 MG PO CAPS
500.0000 mg | ORAL_CAPSULE | Freq: Two times a day (BID) | ORAL | Status: DC
  Administered 2022-03-05: 500 mg via ORAL
  Filled 2022-03-05 (×2): qty 1

## 2022-03-05 MED ORDER — OXYCODONE-ACETAMINOPHEN 5-325 MG PO TABS
1.0000 | ORAL_TABLET | Freq: Four times a day (QID) | ORAL | Status: DC | PRN
  Administered 2022-03-05 – 2022-03-07 (×2): 1 via ORAL
  Filled 2022-03-05 (×3): qty 1

## 2022-03-05 NOTE — Progress Notes (Signed)
[facility] Progress Note Patient Name: Krystal [name] DOB: [birth year] MRN: [id number]   Date of Service  03/05/2022  HPI/Events of Note  Pain - Received Percocet earlier; however, had episode of vomiting after dose.   eICU Interventions  Plan: Fentanyl 12.5 mcg IV Q 4 hours X 2 doses.      Intervention Category Major Interventions: Other:  Lysle Dingwall 03/05/2022, 9:23 PM

## 2022-03-05 NOTE — ED Triage Notes (Signed)
Pt presents with c/o blurred vision and hypertension. Pt reports hx of hypertension that she takes medicine for. Pt also reports that she went to see her cardiologist on Friday and they want her to have an MRI done. Pt reports she has been taking her BP medication as prescribed but that she has also been vomiting because of her interstitial cystitis. Pt reports that everything she sees is blurry at this point and that she is seeing double of most things.

## 2022-03-05 NOTE — H&P (Signed)
NAME:  Krystal [name], MRN:  [id number], DOB:  October 15, 1990, LOS: 0 ADMISSION DATE:  03/05/2022, CONSULTATION DATE:  03/05/22 REFERRING MD:  Dr. Darl Householder, CHIEF COMPLAINT:  vision loss/ HA/ N/v  History of Present Illness:   32 year old female with prior hx as below, significant for chronic resistant hypertension who was sent from cardiology office for acute hypertension with right eye vision loss.   Patient with extensive history of hypertension diagnosed in her teens with prior unrevealing workup at [facility].  Most recently is followed by [facility] cardiology.  In 06/2021, patient was hospitalized for severe HTN, anisocoria, N/V, and headache while pregnant with her third child.  CTH/ MRI negative then.  Developed HELLP syndrome and underwent emergent c-section.    She went back to cardiology today, reporting that her BP's have been reading high in the 220/ 130s, despite compliance with her meds but also has been suffering from frequent N/V over the last 2 weeks.  Some question if patient is compliant with her HTN regimen given EMR review.  Developed intermittent vision loss in her right eye on 1/5 and double vision in the left.  Evaluated in ER on 1/7 with complaints of right eye vision issues and 7 days of abd pain with urinary symptoms.  CTH was negative, BP improved with hydralazine and discharged home on keflex for acute uncomplicated cystitis.  Was unable to get in with ophthalmologist till after 1/16.  Also reports constant headaches, intermittent chest tightness, and intermittent LE edema.  Severe anxiety and depression felt to be contributing to uncontrolled hypertension as patient's child is scheduled for open heart surgery soon.  Patient reports she uses edible THC at times to help with anxiety.   In ER, BP 229/155 with HR 78, afebrile and normoxia.  Was placed on cardene gtt for BP control and given ativan and morphine for cystitis pain.  CMET unremarkable, Hgb 16.1, trop hs neg.  MRI brain was  negative for acute intracranial abnormality.  PCCM called for admit.   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  Denies current CP/ SOB. Mild headache/ pressure behind right eye  Objective   Blood pressure (!) 165/109, pulse (!) 109, temperature 98 F (36.7 C), resp. rate 13, last menstrual period 02/05/2022, SpO2 90 %, unknown if currently breastfeeding.       No intake or output data in the 24 hours ending 03/05/22 1529 There were no vitals filed for this visit.  Examination: General:  Adult female lying on ER stretcher in NAD HEENT: MM pink/dry Neuro: alert, oriented, MAE CV: rr, no murmur PULM:  non labored, clear/ diminished, no wheeze GI: obese, soft, bs+, mild suprapubic tenderness Extremities: warm/dry, trace ankle edema, prior left hand amputation    Resolved Hospital Problem list    Assessment & Plan:   Acute on chronic resistant hypertensive urgency - with headache, intermittent chest tightness, and vision changes, N/V.  Intermittent N/V have been playing a role in keeping meds down per patient.  Also suspect some medication non-compliance.  Per note 1/5, patient overwhelmed with multiple meds, and at that time only  taking norvasc and propanolol (for anxiety).   - admit to ICU  - continue to wean cardene for 20% reduction in BP,  ideal SBP ~170 for 24hrs - restart home amlodipine '10mg'$  daily and hydralazine '100mg'$  TID for now in addition to propanolol as below.  If still uncontrolled, consider adding HCTZ.  Previously intolerant to ACEi  given cough and coreg exacerbated her asthma although she tolerates propanolol.  Suspect if possible, may need to switch off hydralazine for med compliance - check UDS (THC detected on prior screens, may be contributing to N/V vs acute cystitis/ pain/anxiety) - trend trop hs and check EKG  - check TSH in am   Nausea/ vomiting - reports over last two weeks.  Possibly associated with HTN vs pain from IC vs anxiety vs THC use - zofran prn, hold for QTC> 500 - check lipase  Right eye vision loss with left eye diplopia  - MRI brain neg for acute intracranial abnormality  - BP management as above - reports left eye is improved.   - consider ophthalmology consult vs outpt in am   Hypothyroidism - check TSH in am   Anxiety/ depression/ personality disorder - cont klonopin prn, latuda, effexor-XR - restart propranolol BID  Acute uncomplicated cystitis  IC - dx 1/7 in ER and started on keflex, cont for total of 7 days of therapy  - UA here with 100 protein, neg leuks/ nitrates - f/u with urology as outpt  Best Practice (right click and "Reselect all SmartList Selections" daily)   Diet/type: clear liquids DVT prophylaxis: prophylactic heparin  GI prophylaxis: N/A Lines: N/A Foley:  N/A Code Status:  full code Last date of multidisciplinary goals of care discussion [03/05/22]  Labs   CBC: Recent Labs  Lab 03/02/22 1222 03/05/22 1206  WBC 7.6 8.4  NEUTROABS 4.8 5.1  HGB 14.1 16.1*  HCT 41.2 46.4*  MCV 94.7 94.5  PLT 285 638    Basic Metabolic Panel: Recent Labs  Lab 03/02/22 1222 03/05/22 1206  NA 139 141  K 3.8 3.8  CL 102 101  CO2 27 29  GLUCOSE 86 97  BUN 14 17  CREATININE 0.93 0.79  CALCIUM 9.3 9.5   GFR: Estimated Creatinine Clearance: 136.4 mL/min (by C-G formula based on SCr of 0.79 mg/dL). Recent Labs  Lab 03/02/22 1222 03/05/22 1206  WBC 7.6 8.4    Liver Function Tests: Recent Labs  Lab 03/02/22 1222 03/05/22 1206  AST 16 23  ALT 16 21   ALKPHOS 46 54  BILITOT 0.4 0.5  PROT 6.9 8.1  ALBUMIN 4.4 4.7   Recent Labs  Lab 03/02/22 1222  LIPASE 10*   No results for input(s): "AMMONIA" in the last 168 hours.  ABG No results found for: "PHART", "PCO2ART", "PO2ART", "HCO3", "TCO2", "ACIDBASEDEF", "O2SAT"   Coagulation Profile: No results for input(s): "INR", "PROTIME" in the last 168 hours.  Cardiac Enzymes: No results for input(s): "CKTOTAL", "CKMB", "CKMBINDEX", "TROPONINI" in the last 168 hours.  HbA1C: Hgb A1c MFr Bld  Date/Time Value Ref Range Status  04/01/2021 10:48 AM 5.1 4.8 - 5.6 % Final    Comment:             Prediabetes: 5.7 - 6.4          Diabetes: >6.4          Glycemic control for adults with diabetes: <7.0     CBG: No results for input(s): "GLUCAP" in the last 168 hours.  Review of Systems:  As per HPI otherwise negative.   Past Medical History:  She,  has a past medical history of Acute acalculous cholecystitis s/p lap cholecystectomy 05/11/2020 (05/11/2020), Angio-edema (01/15/2021), Anxiety, Asthma, Depression, HELLP (hemolytic anemia/elev liver enzymes/low platelets in pregnancy) (12/08/2017), History of borderline personality disorder, IC (interstitial cystitis), Kidney stones, Migraines, Necrotizing fasciitis ([place]), PTSD (post-traumatic stress disorder), Severe uncontrolled hypertension (10/08/2017), and UTI (urinary tract infection).   Surgical History:   Past Surgical History:  Procedure Laterality Date   APPENDECTOMY     BIOPSY  05/10/2020   Procedure: BIOPSY;  Surgeon: Arta Silence, MD;  Location: [facility];  Service: Endoscopy;;   CESAREAN SECTION N/A 12/06/2017   Procedure: CESAREAN SECTION;  Surgeon: Sloan Leiter, MD;  Location: [place];  Service: Obstetrics;  Laterality: N/A;   CESAREAN SECTION  07/21/2021   Procedure: CESAREAN SECTION;  Surgeon: Donnamae Jude, MD;  Location: [facility];  Service: Obstetrics;;   CHOLECYSTECTOMY N/A 05/11/2020   Procedure:  SINGLE SITE LAPAROSCOPIC CHOLECYSTECTOMY AND LIVER BIOSY;  Surgeon: Michael Boston, MD;  Location: [facility];  Service: General;  Laterality: N/A;   ESOPHAGOGASTRODUODENOSCOPY (EGD) WITH PROPOFOL N/A 05/10/2020   Procedure: ESOPHAGOGASTRODUODENOSCOPY (EGD) WITH PROPOFOL;  Surgeon: Arta Silence, MD;  Location: [facility];  Service: Endoscopy;  Laterality: N/A;   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 She has never used smokeless tobacco. She reports current alcohol use. She reports that she does not use drugs.   Family History:  Her family history includes Hypertension in her father and mother.   Allergies Allergies  Allergen Reactions   Other Shortness Of Breath and Other (See Comments)    FANSIDAR FOR MALARIA-ASTHMA   Red Dye Other (See Comments)    ALL CAUSE PROBLEMS #40 IS WORST-MIGRAINES    Compazine [Prochlorperazine Maleate] Other (See Comments)    TWITCHING, CANT STAY STILL. Pt states she can tolerate promethazine   Vicodin [Hydrocodone-Acetaminophen] Itching, Nausea And Vomiting and Other (See Comments)    Pt states she can tolerate acetaminophen   Amitriptyline Hcl Other (See Comments)    "Angry moods"   Duloxetine Hcl Other (See Comments)    Passing out, trimble, irritable   Lisinopril Cough   Reglan [Metoclopramide] Other (See Comments)    Restlessness    Nifedipine Nausea And Vomiting and Other (See Comments)    Discussed with patient and maternal fetal medicine, patient has intolerance but not frank allergy.     Home Medications  Prior to Admission medications   Medication Sig Start Date End Date Taking? Authorizing Provider  acetaminophen (TYLENOL) 500 MG tablet Take 2 tablets (1,000 mg total) by mouth every 6 (six) hours as needed. 05/14/20   Bonnielee Haff, MD  albuterol (VENTOLIN HFA) 108 (90 Base) MCG/ACT  inhaler Inhale 2 puffs into the lungs every 4 (four) hours as needed for wheezing or shortness of breath (coughing fits). 12/04/21   Garnet Sierras, DO  amLODipine (NORVASC) 10 MG tablet Take 1 tablet (10 mg total) by mouth daily. 05/31/21   Freada Bergeron, MD  cephALEXin (KEFLEX) 250 MG capsule Take 1 capsule (250 mg total) by mouth 4 (four) times daily. 03/02/22   Rex Kras, PA  cetirizine (ZYRTEC ALLERGY) 10 MG tablet Take 1 tablet (10 mg total) by mouth 2 (  two) times daily. 03/26/21   Garnet Sierras, DO  clonazePAM (KLONOPIN) 1 MG tablet Take 1 tablet (1 mg total) by mouth daily as needed for anxiety. 02/28/22 02/28/23  Vista Mink, MD  EPINEPHrine 0.3 mg/0.3 mL IJ SOAJ injection Inject 0.3 mg into the muscle as needed for anaphylaxis. 01/15/21   Garnet Sierras, DO  famotidine (PEPCID) 20 MG tablet Take 1 tablet (20 mg total) by mouth 2 (two) times daily. 12/04/21   Garnet Sierras, DO  hydrALAZINE (APRESOLINE) 100 MG tablet Take 1 tablet (100 mg total) by mouth 3 (three) times daily. 05/14/20   Bonnielee Haff, MD  ibuprofen (ADVIL) 600 MG tablet Take 1 tablet (600 mg total) by mouth every 6 (six) hours as needed for fever, headache or moderate pain. 08/14/21   Genia Del, MD  levothyroxine (SYNTHROID) 125 MCG tablet Take 1 tablet (125 mcg total) by mouth daily before breakfast. 05/24/21   Philemon Kingdom, MD  lurasidone (LATUDA) 20 MG TABS tablet Take 1 tablet (20 mg total) by mouth daily. Take with food (dinner) 01/07/22   Vista Mink, MD  naproxen (NAPROSYN) 500 MG tablet Take 1 tablet (500 mg total) by mouth 2 (two) times daily. 03/02/22   Rex Kras, PA  ondansetron (ZOFRAN-ODT) 4 MG disintegrating tablet Take 1 tablet (4 mg total) by mouth every 8 (eight) hours as needed for nausea or vomiting. 03/02/22   Rex Kras, PA  oxyCODONE-acetaminophen (PERCOCET/ROXICET) 5-325 MG tablet Take 1 tablet by mouth every 6 (six) hours as needed for severe pain. 03/02/22   Rex Kras, PA  Prenatal Multivit-Min-Fe-FA  (PRE-NATAL PO) Take 1 tablet by mouth daily.    [provider]  propranolol (INDERAL) 20 MG tablet Take 1 tablet (20 mg total) by mouth 2 (two) times daily as needed (for anxiety). 01/07/22   Vista Mink, MD  spironolactone (ALDACTONE) 25 MG tablet Take 1 tablet (25 mg total) by mouth daily. 09/19/21   Freada Bergeron, MD  venlafaxine XR (EFFEXOR-XR) 75 MG 24 hr capsule Take 3 capsules (225 mg total) by mouth daily. 01/07/22   Vista Mink, MD     Critical care time: [address] mins       Amanda Cockayne [facility] 03/05/2022, 6:44 PM  See Amion for pager If no response to pager, please call PCCM consult pager After 7:00 pm call Elink

## 2022-03-05 NOTE — ED Notes (Signed)
ED TO INPATIENT HANDOFF REPORT  ED Nurse Name and Phone #: Baxter Flattery, RN  S Name/Age/Gender Krystal [name] 32 y.o. female Room/Bed: WA21/WA21  Code Status   Code Status: Full Code  Home/SNF/Other Home Patient oriented to: self, place, time, and situation Is this baseline? Yes   Triage Complete: Triage complete  Chief Complaint Hypertensive urgency [I16.0]  Triage Note Pt presents with c/o blurred vision and hypertension. Pt reports hx of hypertension that she takes medicine for. Pt also reports that she went to see her cardiologist on Friday and they want her to have an MRI done. Pt reports she has been taking her BP medication as prescribed but that she has also been vomiting because of her interstitial cystitis. Pt reports that everything she sees is blurry at this point and that she is seeing double of most things.    Allergies Allergies  Allergen Reactions   Other Shortness Of Breath and Other (See Comments)    FANSIDAR FOR MALARIA-ASTHMA   Red Dye Other (See Comments)    ALL CAUSE PROBLEMS #40 IS WORST-MIGRAINES    Compazine [Prochlorperazine Maleate] Other (See Comments)    TWITCHING, CANT STAY STILL. Pt states she can tolerate promethazine   Vicodin [Hydrocodone-Acetaminophen] Itching, Nausea And Vomiting and Other (See Comments)    Pt states she can tolerate acetaminophen   Amitriptyline Hcl Other (See Comments)    "Angry moods"   Duloxetine Hcl Other (See Comments)    Passing out, trimble, irritable   Lisinopril Cough   Reglan [Metoclopramide] Other (See Comments)    Restlessness    Nifedipine Nausea And Vomiting and Other (See Comments)    Discussed with patient and maternal fetal medicine, patient has intolerance but not frank allergy.    Level of Care/Admitting Diagnosis ED Disposition     ED Disposition  Admit   Condition  --   Comment  Hospital Area: [facility] [[id number]]  Level of Care: ICU [6]  May [facility] patient to [facility] if equivalent level of care is available:: Yes  Covid Evaluation: Confirmed COVID Negative  Diagnosis: Hypertensive urgency [397673]  Admitting Physician: Maryjane Hurter [[id number]]  Attending Physician: Maryjane Hurter [[id number]]  Certification:: I certify this patient will need inpatient services for at least 2 midnights  Estimated Length of Stay: 3          B Medical/Surgery History Past Medical History:  Diagnosis Date   Acute acalculous cholecystitis s/p lap cholecystectomy 05/11/2020 05/11/2020   Angio-edema 01/15/2021   Anxiety    Asthma    Depression    HELLP (hemolytic anemia/elev liver enzymes/low platelets in pregnancy) 12/08/2017   History of borderline personality disorder    IC (interstitial cystitis)    Kidney stones    Migraines    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   PTSD (post-traumatic stress disorder)    Severe uncontrolled hypertension 10/08/2017   UTI (urinary tract infection)    Past Surgical History:  Procedure Laterality Date   APPENDECTOMY     BIOPSY  05/10/2020   Procedure: BIOPSY;  Surgeon: Arta Silence, MD;  Location: [facility];  Service: Endoscopy;;   CESAREAN SECTION N/A 12/06/2017   Procedure: CESAREAN SECTION;  Surgeon: Sloan Leiter, MD;  Location: [facility];  Service: Obstetrics;  Laterality: N/A;   CESAREAN SECTION  07/21/2021   Procedure: CESAREAN SECTION;  Surgeon: Donnamae Jude, MD;  Location: [facility];  Service: Obstetrics;;  CHOLECYSTECTOMY N/A 05/11/2020   Procedure: SINGLE SITE LAPAROSCOPIC CHOLECYSTECTOMY AND LIVER BIOSY;  Surgeon: Michael Boston, MD;  Location: [facility];  Service: General;  Laterality: N/A;   ESOPHAGOGASTRODUODENOSCOPY (EGD) WITH PROPOFOL N/A 05/10/2020   Procedure: ESOPHAGOGASTRODUODENOSCOPY (EGD) WITH PROPOFOL;  Surgeon: Arta Silence, MD;  Location: [facility];  Service: Endoscopy;  Laterality: N/A;   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   HAND RECONSTRUCTION Right    INCISION AND DRAINAGE     NASAL SEPTUM SURGERY       A IV Location/Drains/Wounds Patient Lines/Drains/Airways Status     Active Line/Drains/Airways     Name Placement date Placement time Site Days   Peripheral IV 03/05/22 20 G 2.5" Anterior;Distal;Right;Upper Arm 03/05/22  1217  Arm  less than 1   Peripheral IV 03/05/22 20 G 2.5" Left Antecubital 03/05/22  1232  Antecubital  less than 1   Incision - 2 Ports Abdomen 1: Umbilicus 2: Right;Upper;Lateral 05/11/20  1906  -- 663            Intake/Output Last 24 hours No intake or output data in the 24 hours ending 03/05/22 1716  Labs/Imaging Results for orders placed or performed during the hospital encounter of 03/05/22 (from the past 48 hour(s))  Urinalysis, Routine w reflex microscopic Anterior Nasal Swab     Status: Abnormal   Collection Time: 03/05/22 11:51 AM  Result Value Ref Range   Color, Urine YELLOW YELLOW   APPearance HAZY (A) CLEAR   Specific Gravity, Urine 1.015 1.005 - 1.030   pH 8.0 5.0 - 8.0   Glucose, UA NEGATIVE NEGATIVE mg/dL   Hgb urine dipstick NEGATIVE NEGATIVE   Bilirubin Urine NEGATIVE NEGATIVE   Ketones, ur 5 (A) NEGATIVE mg/dL   Protein, ur 100 (A) NEGATIVE mg/dL   Nitrite NEGATIVE NEGATIVE   Leukocytes,Ua NEGATIVE NEGATIVE   RBC / HPF 0-5 0 - 5 RBC/hpf   WBC, UA 0-5 0 - 5 WBC/hpf   Bacteria, UA NONE SEEN NONE SEEN   Squamous Epithelial / HPF 0-5 0 - 5 /HPF    Comment: Performed at [facility], [place] [address] [place], [place] [address]  Resp panel by RT-PCR (RSV, Flu A&B, Covid) Anterior Nasal Swab     Status: None   Collection Time: 03/05/22 11:51 AM   Specimen: Anterior Nasal Swab  Result Value Ref Range   SARS Coronavirus 2 by RT PCR NEGATIVE NEGATIVE    Comment: (NOTE) SARS-CoV-2 target nucleic acids are NOT DETECTED.  The SARS-CoV-2 RNA is generally detectable in upper respiratory specimens during the acute phase of  infection. The lowest concentration of SARS-CoV-2 viral copies this assay can detect is 138 copies/mL. A negative result does not preclude SARS-Cov-2 infection and should not be used as the sole basis for treatment or other patient management decisions. A negative result may occur with  improper specimen collection/handling, submission of specimen other than nasopharyngeal swab, presence of viral mutation(s) within the areas targeted by this assay, and inadequate number of viral copies(<138 copies/mL). A negative result must be combined with clinical observations, patient history, and epidemiological information. The expected result is Negative.  Fact Sheet for Patients:  EntrepreneurPulse.com.au  Fact Sheet for Healthcare Providers:  IncredibleEmployment.be  This test is no t yet approved or cleared by the [place] FDA and  has been authorized for detection and/or diagnosis of SARS-CoV-2 by FDA under an Emergency Use Authorization (EUA). This EUA will remain  in effect (  meaning this test can be used) for the duration of the COVID-19 declaration under Section 564(b)(1) of the Act, 21 U.S.C.section 360bbb-3(b)(1), unless the authorization is terminated  or revoked sooner.       Influenza A by PCR NEGATIVE NEGATIVE   Influenza B by PCR NEGATIVE NEGATIVE    Comment: (NOTE) The Xpert Xpress SARS-CoV-2/FLU/RSV plus assay is intended as an aid in the diagnosis of influenza from Nasopharyngeal swab specimens and should not be used as a sole basis for treatment. Nasal washings and aspirates are unacceptable for Xpert Xpress SARS-CoV-2/FLU/RSV testing.  Fact Sheet for Patients: EntrepreneurPulse.com.au  Fact Sheet for Healthcare Providers: IncredibleEmployment.be  This test is not yet approved or cleared by the [place] FDA and has been authorized for detection and/or diagnosis of SARS-CoV-2 by FDA  under an Emergency Use Authorization (EUA). This EUA will remain in effect (meaning this test can be used) for the duration of the COVID-19 declaration under Section 564(b)(1) of the Act, 21 U.S.C. section 360bbb-3(b)(1), unless the authorization is terminated or revoked.     Resp Syncytial Virus by PCR NEGATIVE NEGATIVE    Comment: (NOTE) Fact Sheet for Patients: EntrepreneurPulse.com.au  Fact Sheet for Healthcare Providers: IncredibleEmployment.be  This test is not yet approved or cleared by the [place] FDA and has been authorized for detection and/or diagnosis of SARS-CoV-2 by FDA under an Emergency Use Authorization (EUA). This EUA will remain in effect (meaning this test can be used) for the duration of the COVID-19 declaration under Section 564(b)(1) of the Act, 21 U.S.C. section 360bbb-3(b)(1), unless the authorization is terminated or revoked.  Performed at [facility], [place] [address], [place], [place] [address]   Comprehensive metabolic panel     Status: None   Collection Time: 03/05/22 12:06 PM  Result Value Ref Range   Sodium 141 135 - 145 mmol/L   Potassium 3.8 3.5 - 5.1 mmol/L   Chloride 101 98 - 111 mmol/L   CO2 29 22 - 32 mmol/L   Glucose, Bld 97 70 - 99 mg/dL    Comment: Glucose reference range applies only to samples taken after fasting for at least 8 hours.   BUN 17 6 - 20 mg/dL   Creatinine, Ser 0.79 0.44 - 1.00 mg/dL   Calcium 9.5 8.9 - 10.3 mg/dL   Total Protein 8.1 6.5 - 8.1 g/dL   Albumin 4.7 3.5 - 5.0 g/dL   AST 23 15 - 41 U/L   ALT 21 0 - 44 U/L   Alkaline Phosphatase 54 38 - 126 U/L   Total Bilirubin 0.5 0.3 - 1.2 mg/dL   GFR, Estimated >60 >60 mL/min    Comment: (NOTE) Calculated using the CKD-EPI Creatinine Equation (2021)    Anion gap 11 5 - 15    Comment: Performed at [facility], [place] [address], [place], [place] [address]  Troponin I (High Sensitivity)      Status: None   Collection Time: 03/05/22 12:06 PM  Result Value Ref Range   Troponin I (High Sensitivity) 8 <18 ng/L    Comment: (NOTE) Elevated high sensitivity troponin I (hsTnI) values and significant  changes across serial measurements may suggest ACS but many other  chronic and acute conditions are known to elevate hsTnI results.  Refer to the "Links" section for chest pain algorithms and additional  guidance. Performed at [facility], [place] [address], [place], [place] [address]   CBC with Differential     Status:  Abnormal   Collection Time: 03/05/22 12:06 PM  Result Value Ref Range   WBC 8.4 4.0 - 10.5 K/uL   RBC 4.91 3.87 - 5.11 MIL/uL   Hemoglobin 16.1 (H) 12.0 - 15.0 g/dL   HCT 46.4 (H) 36.0 - 46.0 %   MCV 94.5 80.0 - 100.0 fL   MCH 32.8 26.0 - 34.0 pg   MCHC 34.7 30.0 - 36.0 g/dL   RDW 12.2 11.5 - 15.5 %   Platelets 314 150 - 400 K/uL   nRBC 0.0 0.0 - 0.2 %   Neutrophils Relative % 61 %   Neutro Abs 5.1 1.7 - 7.7 K/uL   Lymphocytes Relative 29 %   Lymphs Abs 2.4 0.7 - 4.0 K/uL   Monocytes Relative 10 %   Monocytes Absolute 0.8 0.1 - 1.0 K/uL   Eosinophils Relative 0 %   Eosinophils Absolute 0.0 0.0 - 0.5 K/uL   Basophils Relative 0 %   Basophils Absolute 0.0 0.0 - 0.1 K/uL   Immature Granulocytes 0 %   Abs Immature Granulocytes 0.03 0.00 - 0.07 K/uL    Comment: Performed at [facility], [place] [address] [place], [place] [address]  Troponin I (High Sensitivity)     Status: Abnormal   Collection Time: 03/05/22  4:00 PM  Result Value Ref Range   Troponin I (High Sensitivity) 23 (H) <18 ng/L    Comment: (NOTE) Elevated high sensitivity troponin I (hsTnI) values and significant  changes across serial measurements may suggest ACS but many other  chronic and acute conditions are known to elevate hsTnI results.  Refer to the "Links" section for chest pain algorithms and additional  guidance. Performed at [facility], [place] [address], [place], [place] [address]    MR Brain W and Wo Contrast  Result Date: 03/05/2022 CLINICAL DATA:  Provided history: Diplopia. Additional history provided: The patient reports vision loss in right eye since Friday (02/28/2022). The patient also reports she is beginning to have double vision in her left eye. Headaches. EXAM: MRI HEAD WITHOUT AND WITH CONTRAST TECHNIQUE: Multiplanar, multiecho pulse sequences of the brain and surrounding structures were obtained without and with intravenous contrast. CONTRAST:  57m GADAVIST GADOBUTROL 1 MMOL/ML IV SOLN COMPARISON:  Head CT 03/02/2022.  Brain MRI 07/02/2021. FINDINGS: Mild intermittent motion degradation. Brain: Cerebral volume is normal. No cortical encephalomalacia is identified. No significant cerebral white matter disease. There is no acute infarct. No evidence of an intracranial mass. No chronic intracranial blood products. No extra-axial fluid collection. No midline shift. No pathologic intracranial enhancement identified. Vascular: Maintained flow voids within the proximal large arterial vessels. Skull and upper cervical spine: No focal suspicious marrow lesion. Sinuses/Orbits: No appreciable orbital mass or acute orbital abnormality. However please note, this examination is not specifically tailored for the assessment of the orbits. Trace mucosal thickening within right ethmoid air cells. IMPRESSION: Unremarkable MRI appearance of the brain. No evidence of acute intracranial abnormality. Electronically Signed   By: KKellie SimmeringD.O.   On: 03/05/2022 14:53    Pending Labs Unresulted Labs (From admission, onward)     Start     Ordered   03/06/22 0500  TSH  Tomorrow morning,   R        03/05/22 1556   03/05/22 1713  Lipase, blood  Add-on,   AD        03/05/22 1712   03/05/22 1712  Creatinine, serum  (heparin)  Once,   R  Comments: Baseline for heparin therapy IF NOT ALREADY DRAWN.    03/05/22 1712             Vitals/Pain Today's Vitals   03/05/22 1615 03/05/22 1630 03/05/22 1645 03/05/22 1700  BP: (!) 151/95 (!) 146/100 (!) 144/100 (!) 138/99  Pulse: (!) 109 (!) 104 (!) 106 100  Resp: '15 18 14 12  '$ Temp:      TempSrc:      SpO2:  91% 96% 92%  PainSc:        Isolation Precautions No active isolations  Medications Medications  nicardipine (CARDENE) '20mg'$  in 0.86% saline 233m IV infusion (0.1 mg/ml) (1 mg/hr Intravenous Rate/Dose Change 03/05/22 1712)  polyethylene glycol (MIRALAX / GLYCOLAX) packet 17 g (has no administration in time range)  heparin injection 5,000 Units (has no administration in time range)  ondansetron (ZOFRAN) injection 4 mg (4 mg Intravenous Given 03/05/22 1217)  LORazepam (ATIVAN) injection 1 mg (1 mg Intravenous Given 03/05/22 1345)  morphine (PF) 4 MG/ML injection 4 mg (4 mg Intravenous Given 03/05/22 1258)  gadobutrol (GADAVIST) 1 MMOL/ML injection 10 mL (10 mLs Intravenous Contrast Given 03/05/22 1434)  morphine (PF) 4 MG/ML injection 4 mg (4 mg Intravenous Given 03/05/22 1444)  ondansetron (ZOFRAN) injection 4 mg (4 mg Intravenous Given 03/05/22 1645)    Mobility walks Low fall risk   Focused Assessments Cardiac Assessment Handoff:    No results found for: "CKTOTAL", "CKMB", "CKMBINDEX", "TROPONINI" No results found for: "DDIMER" Does the Patient currently have chest pain? No    R Recommendations: See Admitting Provider Note  Report given to:   Additional Notes:

## 2022-03-05 NOTE — Patient Instructions (Signed)
Medication Instructions:   Your physician recommends that you continue on your current medications as directed. Please refer to the Current Medication list given to you today.  *If you need a refill on your cardiac medications before your next appointment, please call your pharmacy*    Follow-Up:  3 MONTHS WITH DR. [place] [facility] NOW PER DR. [place] TO UNCONTROLLED BLOOD PRESSURE ISSUES, VISION ISSUE, AND TO HAVE APPROPRIATE TESTING DONE.        Important Information About Sugar

## 2022-03-05 NOTE — ED Provider Notes (Signed)
  Physical Exam  BP (!) 146/100   Pulse (!) 104   Temp 98 F (36.7 C)   Resp 18   LMP 02/05/2022   SpO2 91%   Physical Exam  Procedures  Procedures  ED Course / MDM    Medical Decision Making Care assumed at 3 PM.  Patient is here with blurry vision send plan cardiology for MRI brain.  MRI showed no bleed or PRESS.  Patient was started on Cardene drip and her blood pressure is down to the 160s.  Previous provider consulted Dr. Leonel Ramsay from neurology and he recommend managing blood pressure and does not need any neurologic intervention.  I discussed case with Dr. Velda Shell from critical care to see patient.  Amount and/or Complexity of Data Reviewed Labs: ordered. Decision-making details documented in ED Course. Radiology: ordered and independent interpretation performed. Decision-making details documented in ED Course.  Risk Prescription drug management.          Drenda Freeze, MD 03/05/22 [phone]

## 2022-03-05 NOTE — Progress Notes (Signed)
Cardio-Obstetrics Clinic  Follow Up Note   Date:  03/05/2022   ID:  Krystal [name], DOB [birth year], MRN [id number]  PCP:  Patient, No Pcp Per   [facility] Providers Cardiologist:  Freada Bergeron, MD  Electrophysiologist:  None      Referring MD: No ref. provider found   Chief Complaint: HTN  History of Present Illness:    Krystal [name] is a 32 y.o. female [G5P0322] who returns for follow up of HTN.  Patient with extensive history of HTN diagnosed in her teens. Reportedly had secondary work-up at [facility] which was unrevealing (records not available). Also with history of preeclampsia and HELLP syndrome with 2 prior pre-term births at 62w5dand 364w3dShe was followed throughout her third pregnancy for difficult to control HTN and persistent nausea and vomiting making it hard to to tolerate PO medications. She ultimately required hospitalization on 06/2021 for severe hypertension, anisocaria and HA. CT/MRI were negative for acute findings. She had a prolonged hospitalization complicated by persistent HTN, nausea/vomiting requiring NGT placement, severe anxiety/depression, and HELLP syndrome. She underwent emergent c-section with bilateral salpingectomy on 07/21/21. She was discharged POD #3.   Was last seen in clinic 08/2021 where her blood pressures remained very elevated. We continued on amlodipine '10mg'$  daily, hydralazine '100mg'$  TID, started spironolactone '25mg'$  and changed labetalol to coreg '25mg'$  BID. She was referred to Dr. RaOval Linseyut did not show up. Also planned to see Pharm D but missed appointments.  Today, she presents with concerns of her high blood pressure and loss of vision. She has been monitoring her blood pressure at home with readings that are over 220s/130s. She has been compliant with her hydralazine and propranolol, but has been vomiting very often. She states she took her meds this morning but vomited them up shortly afterwards. She feels like there is  nothing she can do as she has a constant headache, right eye vision loss, shortness of breath but cannot tolerate her PO meds.  In regards to her vision loss, she states that she has lost vision in her right on Friday (02/28/2022). She can vaguely see the outline of shapes of objects in her right eye, but otherwise is unable to see anything. In addition, she is beginning to have double vision in her left eye. She went to the ED on 03/02/2022 because she believed she was having a stroke. She had a CT scan that showed no acute findings. She was given hydralazine and then discharged home. She attempted to set up an appointment with her eye doctor but the earliest they would be able to see her is on 03/11/2022. She expressed great concern due to her father going blind right before he passed, and no one knowing why.   In addition, she has been experiencing constant headaches and chest pains. She also endorses LE edema in her ankle for about a week and noted that she has a bump in her left ankle.  We spent a long time discussing options. Unfortunately, due to her inability to tolerate PO medications, continued right eye vision loss, HA, blood pressures >200/120s at home, we recommended she go to the ER for further management. [facility] declined EMS transfer [facility] and [facility] ER right from our visit. She has not been driving with her vision loss.    Prior CV Studies Reviewed: The following studies were reviewed today:  TTE 06/17/21: IMPRESSIONS  1. Global longitudinal strain is -20%. Left ventricular ejection  fraction, by estimation, is 60 to 65%. The left ventricle has normal  function. The left ventricle has no regional wall motion abnormalities.  There is mild left ventricular hypertrophy.  Left ventricular diastolic parameters were normal.   2. Right ventricular systolic function is normal. The right ventricular  size is normal.   3. Mild mitral  valve regurgitation.   4. The aortic valve is tricuspid. Aortic valve regurgitation is not  visualized. Aortic valve sclerosis is present, with no evidence of aortic  valve stenosis.   5. The inferior vena cava is normal in size with greater than 50%  respiratory variability, suggesting right atrial pressure of 3 mmHg.   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   PTSD (post-traumatic stress disorder)    Severe uncontrolled hypertension 10/08/2017   UTI (urinary tract infection)     Past Surgical History:  Procedure Laterality Date   APPENDECTOMY     BIOPSY  05/10/2020   Procedure: BIOPSY;  Surgeon: Arta Silence, MD;  Location: [facility];  Service: Endoscopy;;   CESAREAN SECTION N/A 12/06/2017   Procedure: CESAREAN SECTION;  Surgeon: Sloan Leiter, MD;  Location: [facility];  Service: Obstetrics;  Laterality: N/A;   CESAREAN SECTION  07/21/2021   Procedure: CESAREAN SECTION;  Surgeon: Donnamae Jude, MD;  Location: [facility];  Service: Obstetrics;;   CHOLECYSTECTOMY N/A 05/11/2020   Procedure: SINGLE SITE LAPAROSCOPIC CHOLECYSTECTOMY AND LIVER BIOSY;  Surgeon: Michael Boston, MD;  Location: [facility];  Service: General;  Laterality: N/A;   ESOPHAGOGASTRODUODENOSCOPY (EGD) WITH PROPOFOL N/A 05/10/2020   Procedure: ESOPHAGOGASTRODUODENOSCOPY (EGD) WITH PROPOFOL;  Surgeon: Arta Silence, MD;  Location: [facility];  Service: Endoscopy;  Laterality: N/A;   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   albuterol (VENTOLIN HFA) 108 (90 Base) MCG/ACT inhaler Inhale 2 puffs into the lungs every 4 (four) hours as needed for wheezing or shortness of breath (coughing fits).   amLODipine (NORVASC) 10 MG tablet Take 1 tablet (10 mg total) by mouth daily.   cephALEXin (KEFLEX) 250 MG capsule Take 1 capsule (250 mg total) by mouth 4 (four) times daily.   cetirizine (ZYRTEC ALLERGY) 10 MG tablet Take 1 tablet (10 mg total) by mouth 2 (two) times  daily.   clonazePAM (KLONOPIN) 1 MG tablet Take 1 tablet (1 mg total) by mouth daily as needed for anxiety.   EPINEPHrine 0.3 mg/0.3 mL IJ SOAJ injection Inject 0.3 mg into the muscle as needed for anaphylaxis.   famotidine (PEPCID) 20 MG tablet Take 1 tablet (20 mg total) by mouth 2 (two) times daily.   hydrALAZINE (APRESOLINE) 100 MG tablet Take 1 tablet (100 mg total) by mouth 3 (three) times daily.   ibuprofen (ADVIL) 600 MG tablet Take 1 tablet (600 mg total) by mouth every 6 (six) hours as needed for fever, headache or moderate pain.   levothyroxine (SYNTHROID) 125 MCG tablet Take 1 tablet (125 mcg total) by mouth daily before breakfast.   lurasidone (LATUDA) 20 MG TABS tablet Take 1 tablet (20 mg total) by mouth daily. Take with food (dinner)   naproxen (NAPROSYN) 500 MG tablet Take 1 tablet (500 mg total) by mouth 2 (two) times daily.   ondansetron (ZOFRAN-ODT) 4 MG disintegrating tablet Take 1 tablet (4 mg total) by mouth every 8 (eight) hours as needed for nausea or vomiting.   oxyCODONE-acetaminophen (PERCOCET/ROXICET) 5-325 MG tablet Take 1 tablet by mouth every 6 (six) hours as needed for severe  pain.   Prenatal Multivit-Min-Fe-FA (PRE-NATAL PO) Take 1 tablet by mouth daily.   propranolol (INDERAL) 20 MG tablet Take 1 tablet (20 mg total) by mouth 2 (two) times daily as needed (for anxiety).   spironolactone (ALDACTONE) 25 MG tablet Take 1 tablet (25 mg total) by mouth daily.   venlafaxine XR (EFFEXOR-XR) 75 MG 24 hr capsule Take 3 capsules (225 mg total) by mouth daily.   [DISCONTINUED] amoxicillin-clavulanate (AUGMENTIN) 875-125 MG tablet Take 1 tablet by mouth 2 (two) times daily.   [DISCONTINUED] carvedilol (COREG) 25 MG tablet Take 1 tablet (25 mg total) by mouth 2 (two) times daily.     Allergies:   Red dye, Compazine [prochlorperazine maleate], Other, Vicodin [hydrocodone-acetaminophen], Amitriptyline hcl, Duloxetine hcl, Lisinopril, Reglan [metoclopramide], and Nifedipine   Social History   Socioeconomic History   Marital status: Married    Spouse name: Not on file   Number of children: 3   Years of education: Not on file   Highest education level: 8th grade  Occupational History   Not on file  Tobacco Use   Smoking status: Never   Smokeless tobacco: Never   Tobacco comments:    She tried a cigarette and never smoked again  Vaping Use   Vaping Use: Never used  Substance and Sexual Activity   Alcohol use: Yes    Comment: bottle of wine a week-occ   Drug use: No   Sexual activity: Not Currently  Other Topics Concern   Not on file  Social History Narrative   Not on file   Social Determinants of Health   Financial Resource Strain: Not on file  Food Insecurity: No Food Insecurity (06/13/2021)   Hunger Vital Sign    Worried About Running Out of Food in the Last Year: Never true    Ran Out of Food in the Last Year: Never true  Transportation Needs: Unmet Transportation Needs (06/13/2021)   PRAPARE - Hydrologist (Medical): Yes    Lack of Transportation (Non-Medical): Yes  Physical Activity: Not on file  Stress: Not on file  Social  Connections: Not on file  {    Family History  Problem Relation Age of Onset   Hypertension Mother    Hypertension Father    {  ROS:   Please see the history of present illness.    Review of Systems  Constitutional:  Negative for chills and malaise/fatigue.  HENT:  Negative for nosebleeds and tinnitus.   Eyes:  Positive for blurred vision (loss of vision in right eye since 02/28/2022) and double vision (left eye). Negative for pain.  Respiratory:  Negative for hemoptysis and shortness of breath.   Cardiovascular:  Positive for chest pain (chronic) and leg swelling (Bilateral ankle swelling onset of 1 week. Bump on her Left ankle). Negative for palpitations, orthopnea, claudication and PND.  Gastrointestinal:  Positive for vomiting (unable to keep medications down). Negative for diarrhea and nausea.  Genitourinary:  Negative for hematuria and urgency.  Musculoskeletal:  Negative for joint pain and neck pain.  Neurological:  Positive for headaches (constant). Negative for sensory change and loss of consciousness.  Endo/Heme/Allergies:  Negative for polydipsia.  Psychiatric/Behavioral:  Negative for hallucinations and suicidal ideas.     All other systems reviewed and are negative.   Labs/EKG Reviewed:    EKG:  EKG is personally reviewed. 03/05/2022: EKG was not ordered. 10/14/2021: Sinus bradycardia with sinus arrhythmia  Recent Labs: 06/10/2021: Magnesium 2.0; NT-Pro BNP 165 06/27/2021: B Natriuretic Peptide 28.9 07/03/2021: TSH 2.154 03/02/2022: ALT 16; BUN 14; Creatinine, Ser 0.93; Hemoglobin 14.1; Platelets 285; Potassium 3.8; Sodium 139   Recent Lipid Panel No results found for: "CHOL", "TRIG", "HDL", "CHOLHDL", "LDLCALC", "LDLDIRECT"  Physical Exam:    VS:  BP (!) 200/121   Pulse 79   Ht '5\' 11"'$  (1.803 m)   Wt 242 lb 3.2 oz (109.9 kg)   LMP 02/05/2022   SpO2 98%   BMI 33.78 kg/m     Wt Readings from Last 3 Encounters:  03/05/22 242 lb 3.2 oz (109.9 kg)  03/02/22 254  lb (115.2 kg)  12/04/21 254 lb 12.8 oz (115.6 kg)     GEN:  Tearful on exam HEENT: Normal NECK: No JVD; No carotid bruits CARDIAC: RRR,  1/6 systolic murmur RESPIRATORY:  Clear to auscultation without rales, wheezing or rhonchi  ABDOMEN: Soft, non-tender, non-distended MUSCULOSKELETAL:  Trace left ankle edema SKIN: Warm and dry NEUROLOGIC:  Alert and oriented x 3 PSYCHIATRIC:  Anxious, depressed and tearful   Risk Assessment/Risk Calculators:     ASSESSMENT & PLAN:    #Chronic, Resistant HTN: #History of Pre-Eclampsia and HELLP Syndrome: #Right Eye Vision Loss: Patient with long-standing history of chronic hypertension with history of HELLP syndrome and pre-eclampsia in all 3 of her pregnancies. Continues to have severely elevated blood pressures today in the postpartum period and currently not tolerating PO medications due to vomiting. We have trialed zofran but she states it is not working. She is also now complaining of right eye vision loss. CT head on 01/07 without acute pathology but has not had MRI. BP today severely elevated at 200/120. Has HA and persistent vision changes. States she could not tolerate her PO medications this morning as she vomited them up shortly after taking them. Given that she is vomiting up her medications, has persistent vision changes and HA and is not responding to zofran, she has been referred to the ER for further evaluation. She declined transfer by EMS but will obtain a ride to [facility].  -Referred to ER; declined transfer by EMS -Will likely require IV medications for blood pressure control until her nausea improves -Needs MRI head given right eye vision loss and persistent blood pressures >200/120s at home -Suspect anxiety/depression is contributing to some  of ongoing nausea/inability to tolerate PO as her daughter is scheduled for open heart surgery on 03/19/22 -Referred to Dr. Oval Linsey for further work-up and management of HTN  -Continue  amlodipine '10mg'$  daily -Continue hydralazine '100mg'$  TID -Continue coreg '25mg'$  BID  -Continue spironolactone '25mg'$  daily -Will likely need further medication adjustment as in-patient   #Anxiety/Depression: Follows closely with psychiatry. Suspect this is contributing to her presentation as above.   .   Patient Instructions  Medication Instructions:   Your physician recommends that you continue on your current medications as directed. Please refer to the Current Medication list given to you today.  *If you need a refill on your cardiac medications before your next appointment, please call your pharmacy*    Follow-Up:  3 MONTHS WITH DR. [place] ER NOW PER DR. Nowata TO UNCONTROLLED BLOOD PRESSURE ISSUES, VISION ISSUE, AND TO HAVE APPROPRIATE TESTING DONE.        Important Information About Sugar         Dispo:  No follow-ups on file.   Medication Adjustments/Labs and Tests Ordered: Current medicines are reviewed at length with the patient today.  Concerns regarding medicines are outlined above.  Tests Ordered: No orders of the defined types were placed in this encounter.  Medication Changes: No orders of the defined types were placed in this encounter.    I,Rachel Rivera,acting as a scribe for Freada Bergeron, MD.,have documented all relevant documentation on the behalf of Freada Bergeron, MD,as directed by  Freada Bergeron, MD while in the presence of Freada Bergeron, MD.  I, Freada Bergeron, MD, have reviewed all documentation for this visit. The documentation on 03/05/22 for the exam, diagnosis, procedures, and orders are all accurate and complete.

## 2022-03-05 NOTE — ED Notes (Signed)
Pt tx to MRI

## 2022-03-05 NOTE — ED Provider Notes (Signed)
[facility] DEPT Provider Note  CSN: [id number] Arrival date & time: 03/05/22 1101  Chief Complaint(s) Blurred Vision and Hypertension  HPI Krystal [name] is a 32 y.o. female with PMH HTN, preeclampsia with help syndrome, interstitial cystitis who presents emergency department for evaluation of blurry vision, headache, hypertension.  Patient was seen in cardiology clinic this morning with complaints of persistent hypertension and loss of vision where she states that she has lost a significant amount of vision both eyes, right significantly greater than left.  She currently endorses diplopia in the left eye.  She was seen for similar complaints on 03/02/2022 with an overall negative workup and received IV hydralazine and was ultimately discharged with outpatient follow-up.  Patient unable to follow-up outpatient with ophthalmologist.  Due to concern for hypertensive emergency, patient transferred to the emergency department today.   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[place])    left hand amputation   PTSD (post-traumatic stress disorder)    Severe uncontrolled hypertension 10/08/2017   UTI (urinary tract infection)    Patient Active Problem List   Diagnosis Date Noted   Moderate persistent asthma with acute exacerbation 12/04/2021   Cervicalgia 10/31/2021   Borderline personality disorder in adult [facility]) 10/21/2021   Status post repeat low transverse cesarean section 07/21/2021   Intractable nausea and vomiting 07/05/2021   Chronic hypertension with exacerbation during pregnancy in  second trimester    Poor fetal growth affecting management of mother in second trimester    Headache in pregnancy, antepartum, second trimester    [redacted] weeks gestation of pregnancy    IUGR, antenatal 07/02/2021   Anisocoria 07/02/2021   History of excision of intestinal structure 05/23/2021   Attention deficit disorder 04/17/2021   Chronic fatigue syndrome 04/17/2021   Anosmia 04/17/2021   Supervision of high risk pregnancy, antepartum 04/01/2021   History of preterm delivery, currently pregnant 04/01/2021   Unwanted fertility 04/01/2021   Chronic urticaria 01/15/2021   Local reaction to hymenoptera sting 01/15/2021   Perennial allergic rhinitis 01/15/2021   MDD (major depressive disorder), recurrent episode, severe ([place]) 11/19/2020   Fibromyalgia 05/11/2020   Gastroesophageal reflux disease 05/11/2020   Interstitial cystitis 05/11/2020   Posttraumatic stress disorder 05/11/2020   Primary insomnia 05/11/2020   Essential hypertension 05/11/2020   Hypothyroidism due to Hashimoto's thyroiditis 10/03/2019   Anxiety 06/16/2019   History of uterine scar from previous surgery 06/07/2019   Obesity 06/07/2019   History of HELLP syndrome, currently pregnant 04/21/2019   Lumbar spondylosis 06/23/2018   Sacroiliac inflammation ([place]) 06/23/2018   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   History of upper limb amputation, wrist, 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   Chronic hypertension in pregnancy 10/08/2017   Home Medication(s) Prior to Admission medications   Medication Sig Start Date End Date Taking? Authorizing Provider  acetaminophen (TYLENOL) 500 MG tablet Take 2 tablets (1,000 mg total) by mouth every 6 (six) hours as needed. 05/14/20   Bonnielee Haff, MD  albuterol (VENTOLIN HFA) 108 (90 Base) MCG/ACT inhaler Inhale 2 puffs into the lungs every 4 (four) hours as needed for wheezing or shortness of  breath (coughing fits). 12/04/21   Garnet Sierras, DO  amLODipine (NORVASC) 10 MG tablet Take 1 tablet (10 mg  total) by mouth daily. 05/31/21   Freada Bergeron, MD  cephALEXin (KEFLEX) 250 MG capsule Take 1 capsule (250 mg total) by mouth 4 (four) times daily. 03/02/22   Rex Kras, PA  cetirizine (ZYRTEC ALLERGY) 10 MG tablet Take 1 tablet (10 mg total) by mouth 2 (two) times daily. 03/26/21   Garnet Sierras, DO  clonazePAM (KLONOPIN) 1 MG tablet Take 1 tablet (1 mg total) by mouth daily as needed for anxiety. 02/28/22 02/28/23  Vista Mink, MD  EPINEPHrine 0.3 mg/0.3 mL IJ SOAJ injection Inject 0.3 mg into the muscle as needed for anaphylaxis. 01/15/21   Garnet Sierras, DO  famotidine (PEPCID) 20 MG tablet Take 1 tablet (20 mg total) by mouth 2 (two) times daily. 12/04/21   Garnet Sierras, DO  hydrALAZINE (APRESOLINE) 100 MG tablet Take 1 tablet (100 mg total) by mouth 3 (three) times daily. 05/14/20   Bonnielee Haff, MD  ibuprofen (ADVIL) 600 MG tablet Take 1 tablet (600 mg total) by mouth every 6 (six) hours as needed for fever, headache or moderate pain. 08/14/21   Genia Del, MD  levothyroxine (SYNTHROID) 125 MCG tablet Take 1 tablet (125 mcg total) by mouth daily before breakfast. 05/24/21   Philemon Kingdom, MD  lurasidone (LATUDA) 20 MG TABS tablet Take 1 tablet (20 mg total) by mouth daily. Take with food (dinner) 01/07/22   Vista Mink, MD  naproxen (NAPROSYN) 500 MG tablet Take 1 tablet (500 mg total) by mouth 2 (two) times daily. 03/02/22   Rex Kras, PA  ondansetron (ZOFRAN-ODT) 4 MG disintegrating tablet Take 1 tablet (4 mg total) by mouth every 8 (eight) hours as needed for nausea or vomiting. 03/02/22   Rex Kras, PA  oxyCODONE-acetaminophen (PERCOCET/ROXICET) 5-325 MG tablet Take 1 tablet by mouth every 6 (six) hours as needed for severe pain. 03/02/22   Rex Kras, PA  Prenatal Multivit-Min-Fe-FA (PRE-NATAL PO) Take 1 tablet by mouth daily.    [provider]  propranolol (INDERAL) 20 MG tablet Take 1 tablet (20 mg total) by mouth 2 (two) times daily as needed (for anxiety). 01/07/22   Vista Mink, MD  spironolactone (ALDACTONE) 25 MG tablet Take 1 tablet (25 mg total) by mouth daily. 09/19/21   Freada Bergeron, MD  venlafaxine XR (EFFEXOR-XR) 75 MG 24 hr capsule Take 3 capsules (225 mg total) by mouth daily. 01/07/22   Vista Mink, MD                                                                                                                                    Past Surgical History Past Surgical History:  Procedure Laterality Date   APPENDECTOMY     BIOPSY  05/10/2020   Procedure: BIOPSY;  Surgeon: Arta Silence,  MD;  Location: [facility];  Service: Endoscopy;;   CESAREAN SECTION N/A 12/06/2017   Procedure: CESAREAN SECTION;  Surgeon: Sloan Leiter, MD;  Location: [facility];  Service: Obstetrics;  Laterality: N/A;   CESAREAN SECTION  07/21/2021   Procedure: CESAREAN SECTION;  Surgeon: Donnamae Jude, MD;  Location: [facility];  Service: Obstetrics;;   CHOLECYSTECTOMY N/A 05/11/2020   Procedure: SINGLE SITE LAPAROSCOPIC CHOLECYSTECTOMY AND LIVER BIOSY;  Surgeon: Michael Boston, MD;  Location: [facility];  Service: General;  Laterality: N/A;   ESOPHAGOGASTRODUODENOSCOPY (EGD) WITH PROPOFOL N/A 05/10/2020   Procedure: ESOPHAGOGASTRODUODENOSCOPY (EGD) WITH PROPOFOL;  Surgeon: Arta Silence, MD;  Location: [facility];  Service: Endoscopy;  Laterality: N/A;   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   HAND RECONSTRUCTION Right    INCISION AND DRAINAGE     NASAL SEPTUM SURGERY     Family History Family History  Problem Relation Age of Onset   Hypertension Mother    Hypertension Father     Social History Social History   Tobacco Use   Smoking status: Never   Smokeless tobacco: Never   Tobacco comments:    She tried a cigarette and never smoked again  Vaping Use   Vaping Use: Never used  Substance Use Topics   Alcohol use: Yes    Comment: bottle of wine a week-occ   Drug use: No   Allergies Red dye, Compazine [prochlorperazine  maleate], Other, Vicodin [hydrocodone-acetaminophen], Amitriptyline hcl, Duloxetine hcl, Lisinopril, Reglan [metoclopramide], and Nifedipine  Review of Systems Review of Systems  Eyes:  Positive for visual disturbance.  Genitourinary:  Positive for pelvic pain.  Neurological:  Positive for headaches.    Physical Exam Vital Signs  I have reviewed the triage vital signs BP (!) 229/155 (BP Location: Left Arm) Comment: Triage RN notified.  Pulse 78   Temp 98 F (36.7 C) (Oral)   Resp 17   LMP 02/05/2022   SpO2 98%   Physical Exam Vitals and nursing note reviewed.  Constitutional:      General: She is in acute distress.     Appearance: She is well-developed.  HENT:     Head: Normocephalic and atraumatic.  Eyes:     Conjunctiva/sclera: Conjunctivae normal.     Comments: Decreased visual acuity bilaterally  Cardiovascular:     Rate and Rhythm: Normal rate and regular rhythm.     Heart sounds: No murmur heard. Pulmonary:     Effort: Pulmonary effort is normal. No respiratory distress.  Abdominal:     Tenderness: There is abdominal tenderness.  Musculoskeletal:        General: No swelling.     Cervical back: Neck supple.  Skin:    General: Skin is warm and dry.     Capillary Refill: Capillary refill takes less than 2 seconds.  Neurological:     Mental Status: She is alert.     Cranial Nerves: No cranial nerve deficit.     Sensory: No sensory deficit.     Motor: No weakness.  Psychiatric:        Mood and Affect: Mood normal.     ED Results and Treatments Labs (all labs ordered are listed, but only abnormal results are displayed) Labs Reviewed  CBC WITH DIFFERENTIAL/PLATELET - Abnormal; Notable for the following components:      Result Value   Hemoglobin 16.1 (*)    HCT 46.4 (*)    All other components within normal limits  RESP PANEL BY RT-PCR (RSV, FLU A&B, COVID)  RVPGX2  COMPREHENSIVE METABOLIC PANEL  URINALYSIS, ROUTINE W REFLEX MICROSCOPIC  TROPONIN I (HIGH  SENSITIVITY)                                                                                                                          Radiology No results found.  Pertinent labs & imaging results that were available during my care of the patient were reviewed by me and considered in my medical decision making (see MDM for details).  Medications Ordered in ED Medications  LORazepam (ATIVAN) injection 1 mg (has no administration in time range)  nicardipine (CARDENE) '20mg'$  in 0.86% saline 25m IV infusion (0.1 mg/ml) (7.5 mg/hr Intravenous Rate/Dose Change 03/05/22 1231)  ondansetron (ZOFRAN) injection 4 mg (4 mg Intravenous Given 03/05/22 1217)                                                                                                                                     Procedures .Critical Care  Performed by: KTeressa Lower MD Authorized by: KTeressa Lower MD   Critical care provider statement:    Critical care time (minutes):  30   Critical care was necessary to treat or prevent imminent or life-threatening deterioration of the following conditions: Hypertensive emergency requiring continuous drip.   Critical care was time spent personally by me on the following activities:  Development of treatment plan with patient or surrogate, discussions with consultants, evaluation of patient's response to treatment, examination of patient, ordering and review of laboratory studies, ordering and review of radiographic studies, ordering and performing treatments and interventions, pulse oximetry, re-evaluation of patient's condition and review of old charts   (including critical care time)  Medical Decision Making / ED Course   This patient presents to the ED for concern of vision loss, hypertension, urinary pain, this involves an extensive number of treatment options, and is a complaint that carries with it a high risk of complications and morbidity.  The differential diagnosis includes  hypertensive urgency, hypertensive emergency, press, intracranial mass, IIH  MDM: Patient seen emergency room for evaluation of hypertension and vision loss.  Physical exam with decreased visual acuity bilaterally and tenderness over the bladder consistent with her known interstitial cystitis.  She also has healing scars on the right hand and an amputated left hand.  Laboratory evaluation largely unremarkable, COVID and flu negative.  MRI with and without contrast is reassuringly negative.  Due to concern for hypertensive emergency, patient started on a Cardene drip and will require ICU admission for further management of her uncontrolled blood pressures.  I did briefly speak with Dr. Leonel Ramsay of neurology he states that the symptoms are likely ophthalmologic in origin and with negative MRI imaging he has overall low suspicion for CVA or IIH as the main driver of the symptoms.  Patient then admitted   Additional history obtained:  -External records from outside source obtained and reviewed including: Chart review including previous notes, labs, imaging, consultation notes   Lab Tests: -I ordered, reviewed, and interpreted labs.   The pertinent results include:   Labs Reviewed  CBC WITH DIFFERENTIAL/PLATELET - Abnormal; Notable for the following components:      Result Value   Hemoglobin 16.1 (*)    HCT 46.4 (*)    All other components within normal limits  RESP PANEL BY RT-PCR (RSV, FLU A&B, COVID)  RVPGX2  COMPREHENSIVE METABOLIC PANEL  URINALYSIS, ROUTINE W REFLEX MICROSCOPIC  TROPONIN I (HIGH SENSITIVITY)      EKG   EKG Interpretation  Date/Time:  Wednesday March 05 2022 15:54:24 EST Ventricular Rate:  108 PR Interval:  157 QRS Duration: 114 QT Interval:  356 QTC Calculation: 478 R Axis:   -6 Text Interpretation: Sinus tachycardia Biatrial enlargement Left ventricular hypertrophy Confirmed by Atlanta Pelto ([id number]) on 03/05/2022 5:29:37 PM         Imaging Studies  ordered: I ordered imaging studies including MRI brain with and without I independently visualized and interpreted imaging. I agree with the radiologist interpretation   Medicines ordered and prescription drug management: Meds ordered this encounter  Medications   ondansetron (ZOFRAN) injection 4 mg   DISCONTD: hydrALAZINE (APRESOLINE) injection 10 mg   LORazepam (ATIVAN) injection 1 mg   nicardipine (CARDENE) '20mg'$  in 0.86% saline 290m IV infusion (0.1 mg/ml)    -I have reviewed the patients home medicines and have made adjustments as needed  Critical interventions Nicardipine administration  Consultations Obtained: I requested consultation with the neurologist Dr. KLeonel Ramsay  and discussed lab and imaging findings as well as pertinent plan - they recommend: No acute neurologic intervention, continue blood pressure management   Cardiac Monitoring: The patient was maintained on a cardiac monitor.  I personally viewed and interpreted the cardiac monitored which showed an underlying rhythm of: NSR  Social Determinants of Health:  Factors impacting patients care include: none   Reevaluation: After the interventions noted above, I reevaluated the patient and found that they have :stayed the same  Co morbidities that complicate the patient evaluation  Past Medical History:  Diagnosis Date   Acute acalculous cholecystitis s/p lap cholecystectomy 05/11/2020 05/11/2020   Angio-edema 01/15/2021   Anxiety    Asthma    Depression    HELLP (hemolytic anemia/elev liver enzymes/low platelets in pregnancy) 12/08/2017   History of borderline personality disorder    IC (interstitial cystitis)    Kidney stones    Migraines    [place]    left hand amputation   PTSD (post-traumatic stress disorder)    Severe uncontrolled hypertension 10/08/2017   UTI (urinary tract infection)       Dispostion: I considered admission for this patient, and due to suspected  hypertensive emergency, patient require hospital admission     Final Clinical Impression(s) / ED Diagnoses Final diagnoses:  None     '@PCDICTATION'$ @    KTeressa Lower MD 03/05/22  1730  

## 2022-03-05 NOTE — Progress Notes (Signed)
Patient vomited large amount of clear emesis about 20 minutes after administration of oral amlodipine and hydrocodone. Critical care NP notified. New orders received.

## 2022-03-06 ENCOUNTER — Inpatient Hospital Stay (HOSPITAL_COMMUNITY): Payer: Medicaid Other

## 2022-03-06 DIAGNOSIS — I160 Hypertensive urgency: Secondary | ICD-10-CM | POA: Diagnosis not present

## 2022-03-06 LAB — BASIC METABOLIC PANEL
Anion gap: 10 (ref 5–15)
BUN: 14 mg/dL (ref 6–20)
CO2: 25 mmol/L (ref 22–32)
Calcium: 9 mg/dL (ref 8.9–10.3)
Chloride: 102 mmol/L (ref 98–111)
Creatinine, Ser: 0.76 mg/dL (ref 0.44–1.00)
GFR, Estimated: >60 mL/min (ref >60)
Glucose, Bld: 124 mg/dL — ABNORMAL HIGH (ref 70–99)
Potassium: 3.5 mmol/L (ref 3.5–5.1)
Sodium: 137 mmol/L (ref 135–145)

## 2022-03-06 LAB — CBC
HCT: 51 % — ABNORMAL HIGH (ref 36.0–46.0)
Hemoglobin: 17.5 g/dL — ABNORMAL HIGH (ref 12.0–15.0)
MCH: 33.1 pg (ref 26.0–34.0)
MCHC: 34.3 g/dL (ref 30.0–36.0)
MCV: 96.6 fL (ref 80.0–100.0)
Platelets: 340 10*3/uL (ref 150–400)
RBC: 5.28 MIL/uL — ABNORMAL HIGH (ref 3.87–5.11)
RDW: 12.5 % (ref 11.5–15.5)
WBC: 11.1 10*3/uL — ABNORMAL HIGH (ref 4.0–10.5)
nRBC: 0 % (ref 0.0–0.2)

## 2022-03-06 LAB — TSH: TSH: 5.404 u[IU]/mL — ABNORMAL HIGH (ref 0.350–4.500)

## 2022-03-06 MED ORDER — HYDROMORPHONE HCL 1 MG/ML IJ SOLN
0.5000 mg | INTRAMUSCULAR | Status: DC | PRN
  Administered 2022-03-06 – 2022-03-07 (×6): 0.5 mg via INTRAVENOUS
  Filled 2022-03-06 (×6): qty 1

## 2022-03-06 MED ORDER — HYDRALAZINE HCL 20 MG/ML IJ SOLN
10.0000 mg | INTRAMUSCULAR | Status: DC | PRN
  Administered 2022-03-07 – 2022-03-11 (×7): 10 mg via INTRAVENOUS
  Filled 2022-03-06 (×9): qty 1

## 2022-03-06 MED ORDER — SODIUM CHLORIDE 0.9 % IV SOLN
12.5000 mg | Freq: Four times a day (QID) | INTRAVENOUS | Status: DC | PRN
  Administered 2022-03-06: 12.5 mg via INTRAVENOUS
  Filled 2022-03-06 (×3): qty 0.5

## 2022-03-06 MED ORDER — DIPHENHYDRAMINE HCL 50 MG/ML IJ SOLN: 12.5000 mg | Freq: Once | INTRAMUSCULAR | Status: AC

## 2022-03-06 MED ORDER — CLONIDINE HCL 0.1 MG/24HR TD PTWK
0.1000 mg | MEDICATED_PATCH | TRANSDERMAL | Status: DC
  Administered 2022-03-06: 0.1 mg via TRANSDERMAL
  Filled 2022-03-06: qty 1

## 2022-03-06 MED ORDER — CALCIUM CARBONATE ANTACID 500 MG PO CHEW
2.0000 | CHEWABLE_TABLET | Freq: Three times a day (TID) | ORAL | Status: DC | PRN
  Filled 2022-03-06: qty 2

## 2022-03-06 MED ORDER — FAMOTIDINE IN NACL 20-0.9 MG/50ML-% IV SOLN
20.0000 mg | Freq: Once | INTRAVENOUS | Status: AC
  Administered 2022-03-06: 20 mg via INTRAVENOUS
  Filled 2022-03-06: qty 50

## 2022-03-06 MED ORDER — DIPHENHYDRAMINE HCL 50 MG/ML IJ SOLN
INTRAMUSCULAR | Status: AC
  Administered 2022-03-06: 12.5 mg via INTRAVENOUS
  Filled 2022-03-06: qty 1

## 2022-03-06 MED ORDER — SODIUM CHLORIDE 0.9 % IV SOLN
8.0000 mg | Freq: Three times a day (TID) | INTRAVENOUS | Status: DC | PRN
  Administered 2022-03-07 (×2): 8 mg via INTRAVENOUS
  Filled 2022-03-06 (×4): qty 4

## 2022-03-06 MED ORDER — LURASIDONE HCL 20 MG PO TABS
20.0000 mg | ORAL_TABLET | Freq: Every day | ORAL | Status: DC
  Administered 2022-03-07 – 2022-03-16 (×10): 20 mg via ORAL
  Filled 2022-03-06 (×12): qty 1

## 2022-03-06 MED ORDER — SODIUM CHLORIDE 0.9 % IV SOLN
25.0000 mg | Freq: Four times a day (QID) | INTRAVENOUS | Status: DC | PRN
  Administered 2022-03-07 – 2022-03-10 (×8): 25 mg via INTRAVENOUS
  Filled 2022-03-06 (×7): qty 25
  Filled 2022-03-06: qty 1

## 2022-03-06 MED ORDER — SODIUM CHLORIDE 0.9 % IV SOLN
2.0000 g | Freq: Every day | INTRAVENOUS | Status: AC
  Administered 2022-03-06 – 2022-03-08 (×3): 2 g via INTRAVENOUS
  Filled 2022-03-06 (×3): qty 20

## 2022-03-06 MED ORDER — PANTOPRAZOLE SODIUM 40 MG IV SOLR
40.0000 mg | INTRAVENOUS | Status: DC
  Administered 2022-03-06 – 2022-03-09 (×4): 40 mg via INTRAVENOUS
  Filled 2022-03-06 (×4): qty 10

## 2022-03-06 MED ORDER — DEXAMETHASONE SODIUM PHOSPHATE 4 MG/ML IJ SOLN
2.0000 mg | Freq: Once | INTRAMUSCULAR | Status: AC
  Administered 2022-03-06: 2 mg via INTRAVENOUS
  Filled 2022-03-06: qty 1

## 2022-03-06 MED ORDER — FENTANYL CITRATE PF 50 MCG/ML IJ SOSY
12.5000 ug | PREFILLED_SYRINGE | INTRAMUSCULAR | Status: DC | PRN
  Administered 2022-03-06: 12.5 ug via INTRAVENOUS
  Filled 2022-03-06: qty 1

## 2022-03-06 MED ORDER — CAPSAICIN 0.075 % EX CREA
TOPICAL_CREAM | Freq: Two times a day (BID) | CUTANEOUS | Status: DC
  Administered 2022-03-06 – 2022-03-09 (×5): 1 via TOPICAL
  Filled 2022-03-06: qty 57

## 2022-03-06 MED ORDER — SODIUM CHLORIDE 0.9 % IV SOLN
12.5000 mg | Freq: Once | INTRAVENOUS | Status: AC
  Administered 2022-03-06: 12.5 mg via INTRAVENOUS
  Filled 2022-03-06: qty 12.5

## 2022-03-06 MED ORDER — NICARDIPINE HCL IN NACL 40-0.83 MG/200ML-% IV SOLN
3.0000 mg/h | INTRAVENOUS | Status: DC
  Administered 2022-03-06 – 2022-03-07 (×3): 12.5 mg/h via INTRAVENOUS
  Administered 2022-03-07: 7.5 mg/h via INTRAVENOUS
  Administered 2022-03-07: 10 mg/h via INTRAVENOUS
  Administered 2022-03-07: 12.5 mg/h via INTRAVENOUS
  Administered 2022-03-07: 10 mg/h via INTRAVENOUS
  Administered 2022-03-07 – 2022-03-08 (×2): 12.5 mg/h via INTRAVENOUS
  Administered 2022-03-08: 15 mg/h via INTRAVENOUS
  Administered 2022-03-08 (×2): 12.5 mg/h via INTRAVENOUS
  Filled 2022-03-06 (×14): qty 200

## 2022-03-06 MED ORDER — ALUM & MAG HYDROXIDE-SIMETH 200-200-20 MG/5ML PO SUSP
30.0000 mL | Freq: Four times a day (QID) | ORAL | Status: DC | PRN
  Administered 2022-03-08 – 2022-03-11 (×5): 30 mL via ORAL
  Filled 2022-03-06 (×6): qty 30

## 2022-03-06 NOTE — Progress Notes (Signed)
NAME:  Krystal [name], MRN:  [id number], DOB:  March 14, 1990, LOS: 1 ADMISSION DATE:  03/05/2022, CONSULTATION DATE:  03/05/22 REFERRING MD:  Dr. Darl Householder, CHIEF COMPLAINT:  vision loss/ HA/ N/v  History of Present Illness:   32 year old female with prior hx as below, significant for chronic resistant hypertension who was sent from cardiology office for acute hypertension with right eye vision loss.   Patient with extensive history of hypertension diagnosed in her teens with prior unrevealing workup at [facility].  Most recently is followed by [facility] cardiology.  In 06/2021, patient was hospitalized for severe HTN, anisocoria, N/V, and headache while pregnant with her third child.  CTH/ MRI negative then.  Developed HELLP syndrome and underwent emergent c-section.    She went back to cardiology today, reporting that her BP's have been reading high in the 220/ 130s, despite compliance with her meds but also has been suffering from frequent N/V over the last 2 weeks.  Some question if patient is compliant with her HTN regimen given EMR review.  Developed intermittent vision loss in her right eye on 1/5 and double vision in the left.  Evaluated in ER on 1/7 with complaints of right eye vision issues and 7 days of abd pain with urinary symptoms.  CTH was negative, BP improved with hydralazine and discharged home on keflex for acute uncomplicated cystitis.  Was unable to get in with ophthalmologist till after 1/16.  Also reports constant headaches, intermittent chest tightness, and intermittent LE edema.  Severe anxiety and depression felt to be contributing to uncontrolled hypertension as patient's child is scheduled for open heart surgery soon.  Patient reports she uses edible THC at times to help with anxiety.   In ER, BP 229/155 with HR 78, afebrile and normoxia.  Was placed on cardene gtt for BP control and given ativan and morphine for cystitis pain.  CMET unremarkable, Hgb 16.1, trop hs neg.  MRI brain was  negative for acute intracranial abnormality.  PCCM called for admit.   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   Visual symptoms improving overall, still some central R vision loss  Prn fentanyl for abdominal pain  Objective   Blood pressure (!) 170/90, pulse 89, temperature 97.9 F (36.6 C), temperature source Oral, resp. rate 18, height '5\' 11"'$  (1.803 m), weight 108.5 kg, last menstrual period 02/05/2022, SpO2 (!) 89 %, unknown if currently breastfeeding.        Intake/Output Summary (Last 24 hours) at 03/06/2022 0807 Last data filed at 03/06/2022 0730 Gross per 24 hour  Intake 998.96 ml  Output --  Net 998.96 ml   Filed Weights   03/05/22 1800 03/06/22 0500  Weight: 106.1 kg 108.5 kg    Examination: General appearance: 32 y.o., female, NAD, conversant  Eyes: anicteric sclerae; PERRL, tracking appropriately HENT: NCAT; MMM Neck: Trachea midline; no lymphadenopathy, no JVD Lungs: CTAB, no crackles, no wheeze, with normal respiratory effort CV: RRR, no murmur  Abdomen: Soft, non-tender; non-distended, BS present  Extremities: No peripheral edema, warm Skin: Normal turgor and texture; no rash Neuro: attentive, grossly nonfocal  Lipase 30 TSH 5.4 Hb 17   Resolved Hospital Problem list  Assessment & Plan:   Acute on chronic resistant hypertensive urgency HA and vision change improving with BP control. Nonadherence, cannabis use (and possible cannabis hyperemesis) complicating outpatient mgmt. - continue to wean cardene for 20% reduction in BP,  ideal SBP ~170 for  24hrs - home amlodipine '10mg'$  daily and hydralazine '100mg'$  TID for now in addition to propanolol as below.  If still uncontrolled, consider adding HCTZ.  Previously intolerant to ACEi given cough and coreg exacerbated her asthma although she tolerates propanolol.  Suspect if possible, may need to switch off hydralazine for med compliance - consider repeat PSG as below though she is slightly below last weight at which PSG in past was obtained (26yr ago negative reportedly)  Nausea/ vomiting Reports over last two weeks.  Possibly associated with HTN vs pain from IC vs anxiety vs THC use - KUB - zofran prn, hold for QTC> 500 - trial topical capsaicin to abdomen  Hypoxia ?Sleep disordered breathing - CXR  Right eye vision loss with left eye diplopia, improving  - BP mgmt as above  - consider ophthalmology consult vs outpt if failing to resolve  Hypothyroidism - home synthroid, nausea limiting adherence  Anxiety/ depression/ personality disorder - cont klonopin prn, latuda, effexor-XR - restart propranolol BID  Acute uncomplicated cystitis  IC - dx 1/7 in ER and started on keflex, cont for total of 7 days of therapy  - UA here with 100 protein, neg leuks/ nitrates - f/u with urology as outpt  Best Practice (right click and "Reselect all SmartList Selections" daily)   Diet/type: clear liquids DVT prophylaxis: prophylactic heparin  GI prophylaxis: N/A Lines: N/A Foley:  N/A Code Status:  full code Last date of multidisciplinary goals of care discussion [03/05/22]  Critical care time: 35 mins       [place] 03/06/2022, 8:07 AM  See AShea Evansfor pager If no response to pager, please call PCCM consult pager After 7:00 pm call Elink

## 2022-03-06 NOTE — Progress Notes (Addendum)
Patient continues to have episodes of vomiting and unable to keep liquids or meds down despite zofran and promethazine IV.   Med allergies/ intolerances to reglan and compazine Qtc 408  CT a/p on 1/7 without acute process Does admit to taking some THC edibles, could by hyperemesis vs cyclic vomiting.  Also feel that her IC pain and anxiety are contributing to her HTN and probable N/V which have been addressed.   Plan - discussed with pharmacy and Dr. Verlee Monte.  Will maximize zofran and promethazine dosing first.   - decadron '2mg'$  IV x 1 - capsaicin cream to be applied to epigastric area BID - NPO for bowel rest - if attempts above fail> consider either droperidol vs haldol vs aprepitant   Addendum> -  pt willing to try clonidine patch as this might help her with medication compliance at home> ordered 0.'1mg'$  patch -  Also feels like her anxiety/ depression is severe given current situation/ hospitalization and situational stress at home.  Has been unable to see her psychiatrist as they have been on leave.  Would like/ open to inpt psych consult which has been placed.       Kennieth Rad, Edmonia Caprio [facility] 03/06/2022, 4:12 PM  See Amion for pager If no response to pager, please call PCCM consult pager After 7:00 pm call Elink

## 2022-03-06 NOTE — Progress Notes (Signed)
[place] [facility] Progress Note Patient Name: Krystal [name] DOB: 1991/01/11 MRN: [id number]   Date of Service  03/06/2022  HPI/Events of Note  PCCM rounding team ordered NPO/bowel rest orders; however, PO medications were not changed to IV. Patient is on Mylanta, Amlodipine, Klonopin, Latuda, Percocet and Inderal PO. Patient c/o reflux d/t vomiting.   eICU Interventions  Plan: Hydralazine 10 mg IV Q 4 hours PRN SBP > 160 or DBP > 100. D/C Protonix PO. Protonix 40 mg IV now and Q day. Pepcid 20 mg IV X 1.     Intervention Category Major Interventions: Other:  Lysle Dingwall 03/06/2022, 8:15 PM

## 2022-03-06 NOTE — Progress Notes (Signed)
  Transition of Care (TOC) Screening Note   Patient Details  Name: [name] Date of Birth: Nov 06, 1990   Transition of Care [facility]) CM/SW Contact:    Roseanne Kaufman, RN Phone Number: 03/06/2022, 6:36 PM    Transition of Care Department [facility]) has reviewed patient and no TOC needs have been identified at this time. We will continue to monitor patient advancement through interdisciplinary progression rounds. If new patient transition needs arise, please place a TOC consult.

## 2022-03-07 DIAGNOSIS — F603 Borderline personality disorder: Secondary | ICD-10-CM

## 2022-03-07 DIAGNOSIS — F4310 Post-traumatic stress disorder, unspecified: Secondary | ICD-10-CM | POA: Diagnosis not present

## 2022-03-07 DIAGNOSIS — I160 Hypertensive urgency: Secondary | ICD-10-CM | POA: Diagnosis not present

## 2022-03-07 LAB — BASIC METABOLIC PANEL
Anion gap: 11 (ref 5–15)
BUN: 13 mg/dL (ref 6–20)
CO2: 19 mmol/L — ABNORMAL LOW (ref 22–32)
Calcium: 9 mg/dL (ref 8.9–10.3)
Chloride: 107 mmol/L (ref 98–111)
Creatinine, Ser: 0.82 mg/dL (ref 0.44–1.00)
GFR, Estimated: >60 mL/min (ref >60)
Glucose, Bld: 120 mg/dL — ABNORMAL HIGH (ref 70–99)
Potassium: 3.8 mmol/L (ref 3.5–5.1)
Sodium: 137 mmol/L (ref 135–145)

## 2022-03-07 MED ORDER — DIPHENHYDRAMINE HCL 50 MG/ML IJ SOLN
12.5000 mg | Freq: Once | INTRAMUSCULAR | Status: AC
  Administered 2022-03-07: 12.5 mg via INTRAVENOUS
  Filled 2022-03-07: qty 1

## 2022-03-07 MED ORDER — HYDROMORPHONE HCL 1 MG/ML IJ SOLN
1.0000 mg | INTRAMUSCULAR | Status: DC | PRN
  Administered 2022-03-07 – 2022-03-10 (×22): 1 mg via INTRAVENOUS
  Filled 2022-03-07 (×22): qty 1

## 2022-03-07 MED ORDER — KETOROLAC TROMETHAMINE 15 MG/ML IJ SOLN: 15.0000 mg | Freq: Once | INTRAMUSCULAR | Status: DC

## 2022-03-07 MED ORDER — CLONAZEPAM 0.5 MG PO TBDP
1.0000 mg | ORAL_TABLET | Freq: Three times a day (TID) | ORAL | Status: DC
  Administered 2022-03-07 – 2022-03-09 (×6): 1 mg via ORAL
  Filled 2022-03-07 (×6): qty 2

## 2022-03-07 MED ORDER — GABAPENTIN 300 MG PO CAPS
300.0000 mg | ORAL_CAPSULE | Freq: Two times a day (BID) | ORAL | Status: DC
  Administered 2022-03-07 – 2022-03-17 (×21): 300 mg via ORAL
  Filled 2022-03-07 (×20): qty 1

## 2022-03-07 MED ORDER — MORPHINE SULFATE ER 15 MG PO TBCR
15.0000 mg | EXTENDED_RELEASE_TABLET | Freq: Two times a day (BID) | ORAL | Status: DC
  Filled 2022-03-07: qty 1

## 2022-03-07 MED ORDER — MELATONIN 3 MG PO TABS
3.0000 mg | ORAL_TABLET | Freq: Every evening | ORAL | Status: DC | PRN
  Administered 2022-03-07 – 2022-03-16 (×7): 3 mg via ORAL
  Filled 2022-03-07 (×7): qty 1

## 2022-03-07 MED ORDER — DIAZEPAM 5 MG/ML IJ SOLN
5.0000 mg | Freq: Four times a day (QID) | INTRAMUSCULAR | Status: DC
  Administered 2022-03-07: 5 mg via INTRAVENOUS
  Filled 2022-03-07: qty 2

## 2022-03-07 MED ORDER — DIPHENHYDRAMINE HCL 25 MG PO CAPS
25.0000 mg | ORAL_CAPSULE | Freq: Three times a day (TID) | ORAL | Status: DC | PRN
  Filled 2022-03-07: qty 1

## 2022-03-07 MED ORDER — DIPHENHYDRAMINE HCL 50 MG/ML IJ SOLN
12.5000 mg | Freq: Four times a day (QID) | INTRAMUSCULAR | Status: AC | PRN
  Administered 2022-03-07 (×2): 12.5 mg via INTRAVENOUS
  Filled 2022-03-07 (×2): qty 1

## 2022-03-07 MED ORDER — CLONIDINE HCL 0.3 MG/24HR TD PTWK
0.3000 mg | MEDICATED_PATCH | TRANSDERMAL | Status: DC
  Administered 2022-03-13: 0.3 mg via TRANSDERMAL
  Filled 2022-03-07: qty 1

## 2022-03-07 MED ORDER — OXYCODONE-ACETAMINOPHEN 5-325 MG PO TABS: 2.0000 | ORAL_TABLET | Freq: Four times a day (QID) | ORAL | Status: DC

## 2022-03-07 MED ORDER — BISACODYL 5 MG PO TBEC: 10.0000 mg | DELAYED_RELEASE_TABLET | Freq: Every day | ORAL | Status: DC | PRN

## 2022-03-07 MED ORDER — KETOROLAC TROMETHAMINE 15 MG/ML IJ SOLN
15.0000 mg | Freq: Four times a day (QID) | INTRAMUSCULAR | Status: AC
  Administered 2022-03-07 – 2022-03-08 (×4): 15 mg via INTRAVENOUS
  Filled 2022-03-07 (×4): qty 1

## 2022-03-07 NOTE — Progress Notes (Addendum)
NAME:  Krystal [name], MRN:  [id number], DOB:  03-Jun-1990, LOS: 2 ADMISSION DATE:  03/05/2022, CONSULTATION DATE:  03/05/22 REFERRING MD:  Dr. Darl Householder, CHIEF COMPLAINT:  vision loss/ HA/ N/v  History of Present Illness:   32 year old female with prior hx as below, significant for chronic resistant hypertension who was sent from cardiology office for acute hypertension with right eye vision loss.   Patient with extensive history of hypertension diagnosed in her teens with prior unrevealing workup at [facility].  Most recently is followed by [facility] cardiology.  In 06/2021, patient was hospitalized for severe HTN, anisocoria, N/V, and headache while pregnant with her third child.  CTH/ MRI negative then.  Developed HELLP syndrome and underwent emergent c-section.    She went back to cardiology today, reporting that her BP's have been reading high in the 220/ 130s, despite compliance with her meds but also has been suffering from frequent N/V over the last 2 weeks.  Some question if patient is compliant with her HTN regimen given EMR review.  Developed intermittent vision loss in her right eye on 1/5 and double vision in the left.  Evaluated in ER on 1/7 with complaints of right eye vision issues and 7 days of abd pain with urinary symptoms.  CTH was negative, BP improved with hydralazine and discharged home on keflex for acute uncomplicated cystitis.  Was unable to get in with ophthalmologist till after 1/16.  Also reports constant headaches, intermittent chest tightness, and intermittent LE edema.  Severe anxiety and depression felt to be contributing to uncontrolled hypertension as patient's child is scheduled for open heart surgery soon.  Patient reports she uses edible THC at times to help with anxiety.   In ER, BP 229/155 with HR 78, afebrile and normoxia.  Was placed on cardene gtt for BP control and given ativan and morphine for cystitis pain.  CMET unremarkable, Hgb 16.1, trop hs neg.  MRI brain was  negative for acute intracranial abnormality.  PCCM called for admit.   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  No current abd pain or nausea.  Still having dysuria.   Remains on cardene 7.'5mg'$ /hr   Objective   Blood pressure (!) 185/91, pulse 94, temperature 99 F (37.2 C), temperature source Oral, resp. rate 15, height '5\' 11"'$  (1.803 m), weight 108.5 kg, last menstrual period 02/05/2022, SpO2 98 %, unknown if currently breastfeeding.        Intake/Output Summary (Last 24 hours) at 03/07/2022 0725 Last data filed at 03/07/2022 9381 Gross per 24 hour  Intake 2585.1 ml  Output --  Net 2585.1 ml   Filed Weights   03/05/22 1800 03/06/22 0500  Weight: 106.1 kg 108.5 kg    Examination: General:  Adult female sitting in bedside recliner, tearful at times, anxious appearing HEENT: MM pink/moist, pupils 3/reactive Neuro: Aox4, MAE CV: rr, no murmur PULM:  nonlabored, clear, RA GI: obese, soft, bs+, NT/ ND  Extremities: warm/dry, no LE edema. Left amputation above wrist  Skin: no rashes   Labs reviewed   Resolved Hospital Problem list  Assessment & Plan:   Acute on chronic resistant hypertensive urgency HA and vision change improving with BP control. Nonadherence, anxiety/ depression, situational stress, and cannabis use (and possible cannabis hyperemesis) complicating outpatient mgmt. - previously intolerant to ACEi (cough) - cont to wean cardene gtt for SBP < 170 - attempt today to take oral's>  norvasc '10mg'$ , propranolol '20mg'$  BID, and increase clonidine patch - has initial appt with Dr. Oval Linsey who specializes in resistant HTN on 04/09/2022  Nausea/ vomiting vs cyclic vomiting vs cannabis hyperemesis Reports over last two weeks.  Possibly associated with HTN vs pain from IC vs anxiety vs THC use - KUB neg 1/11 - on bowel rest since yesterday.  Will premedicate with zofran/ phenergan then restart with clears with meds, slowly advance as tolerated.   - cont zofran and phenergan prn, hold for  QTC> 500 - trial topical capsaicin to epigastric area - if unable to keep fluids down despite above, will need to consider droperidol vs haldol vs aprepitant - continue pain control and anxiety management> with bowel regimen, enema vs dulcolax for now till able to take POs  Hypoxia ?Sleep disordered breathing - on RA now.  CXR yest unremarkable.  Mobilize  - would benefit from repeat outpt PSG  Right eye vision loss with left eye diplopia, improving  - BP mgmt as above  - consult ophthalmology  Hypothyroidism - TSH 5.4 - resume home synthroid, nausea limiting adherence  Anxiety/ depression/ personality disorder - cont klonopin prn, latuda, effexor-XR when able to keep meds down - restart propranolol BID as above - inpatient psych consult pending> her outpt psychiatrist has been on leave reportedly and unable to get in.  Patient very agreeable to talk/ discuss her medication regimen as she feels like it is not helping and currently exacerbated by major life/ stress event upcoming with heart surgery on her child.   Acute uncomplicated cystitis  IC, flare - dx 1/7 in ER and started on keflex, but unable to keep most down - changed to CXT 1/11 as unable to take PO meds.   - continues to have severe pain with voiding, leading to ongoing N/V.  Will consult urology to help> recs for IV toradol, PT consult for pelvic floor exercises, treat constipation, muscle relaxer> IV vs if able intravaginal  valium '10mg'$  up to q6hrs if needed, minimize narcotics, and bladder scan prn    Best Practice (right click and "Reselect all SmartList Selections" daily)   Diet/type: clear liquids DVT prophylaxis: prophylactic heparin  GI prophylaxis: N/A Lines: N/A Foley:  N/A Code Status:  full code Last date of multidisciplinary goals of care discussion [03/05/22]  Critical care time: 38 mins       Kennieth Rad, Edmonia Caprio [facility] 03/07/2022, 7:25 AM  See Amion for pager If no response to pager, please call PCCM consult pager After 7:00 pm call Elink
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
## 2022-03-07 NOTE — Progress Notes (Signed)
eLink Physician-Brief Progress Note Patient Name: Krystal [name] DOB: [birth year] MRN: [id number]   Date of Service  03/07/2022  HPI/Events of Note  Itching  eICU Interventions  Plan: Benadryl 12.5 mg IV Q 6 hours X 2 doses.      Intervention Category Major Interventions: Other:  Lysle Dingwall 03/07/2022, 12:37 AM

## 2022-03-07 NOTE — Consult Note (Signed)
CC:  Chief Complaint  Patient presents with   Blurred Vision   Hypertension    HPI: Krystal [name] is a 32 y.o. female w/ POH of RE and PMH below who was admitted for hypertensive emergency. Ophthalmology consulted for blurry vision. Blurry vision in OD>>OS. Diplopia - 2 days ago. Binocular in nature. Resolved diplopia when blood pressure came down. + pain behind the eye on right.  ROS: See above  PMH: Past Medical History:  Diagnosis Date   Acute acalculous cholecystitis s/p lap cholecystectomy 05/11/2020 05/11/2020   Angio-edema 01/15/2021   Anxiety    Asthma    Depression    HELLP (hemolytic anemia/elev liver enzymes/low platelets in pregnancy) 12/08/2017   History of borderline personality disorder    IC (interstitial cystitis)    Kidney stones    Migraines    Necrotizing fasciitis ([place])    left hand amputation   PTSD (post-traumatic stress disorder)    Severe uncontrolled hypertension 10/08/2017   UTI (urinary tract infection)     PSH: Past Surgical History:  Procedure Laterality Date   APPENDECTOMY     BIOPSY  05/10/2020   Procedure: BIOPSY;  Surgeon: Arta Silence, MD;  Location: [facility];  Service: Endoscopy;;   CESAREAN SECTION N/A 12/06/2017   Procedure: CESAREAN SECTION;  Surgeon: Sloan Leiter, MD;  Location: [facility];  Service: Obstetrics;  Laterality: N/A;   CESAREAN SECTION  07/21/2021   Procedure: CESAREAN SECTION;  Surgeon: Donnamae Jude, MD;  Location: [facility];  Service: Obstetrics;;   CHOLECYSTECTOMY N/A 05/11/2020   Procedure: SINGLE SITE LAPAROSCOPIC CHOLECYSTECTOMY AND LIVER BIOSY;  Surgeon: Michael Boston, MD;  Location: [facility];  Service: General;  Laterality: N/A;   ESOPHAGOGASTRODUODENOSCOPY (EGD) WITH PROPOFOL N/A 05/10/2020   Procedure: ESOPHAGOGASTRODUODENOSCOPY (EGD) WITH PROPOFOL;  Surgeon: Arta Silence, MD;  Location: [facility];  Service: Endoscopy;  Laterality: N/A;   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   Current Outpatient Medications on File Prior to Encounter  Medication Sig Dispense Refill   acetaminophen (TYLENOL) 500 MG tablet Take 2 tablets (1,000 mg total) by mouth every 6 (six) hours as needed. (Patient taking differently: Take 1,000 mg by mouth every 6 (six) hours as needed for mild pain or headache.) 30 tablet 0   albuterol (VENTOLIN HFA) 108 (90 Base) MCG/ACT inhaler Inhale 2 puffs into the lungs every 4 (four) hours as needed for wheezing or shortness of breath (coughing fits). 18 g 1   amLODipine (NORVASC) 10 MG tablet Take 1 tablet (10 mg total) by mouth daily. 90 tablet 2   cephALEXin (KEFLEX) 250 MG capsule Take 1 capsule (250 mg total) by mouth 4 (four) times daily. 28 capsule 0   cetirizine (ZYRTEC ALLERGY) 10 MG tablet Take 1 tablet (10 mg total) by mouth 2 (two) times daily. 60 tablet 5   clonazePAM (KLONOPIN) 1 MG tablet Take 1 tablet (1 mg total) by mouth daily as needed for anxiety. 30 tablet 0   diazepam (VALIUM) 5 MG tablet Take 5 mg by mouth every 6 (six) hours as needed (for bladder spasms).     EPINEPHrine 0.3 mg/0.3 mL IJ SOAJ injection Inject 0.3 mg into the muscle as needed for anaphylaxis. 1 each 2   famotidine (PEPCID) 20 MG  tablet Take 1 tablet (20 mg total) by mouth 2 (two) times daily. 60 tablet 5   hydrALAZINE (APRESOLINE) 100 MG tablet Take 1 tablet (100 mg total) by mouth 3 (three) times daily. 90 tablet 1   ibuprofen (ADVIL) 600 MG tablet Take 1 tablet (600 mg total) by mouth every 6 (six) hours as needed for fever, headache or moderate pain. 40 tablet 0   levothyroxine (SYNTHROID) 125 MCG tablet Take 1 tablet (125 mcg total) by mouth daily before breakfast. 45 tablet 3   lurasidone (LATUDA) 20 MG TABS tablet Take 1 tablet (20 mg total) by mouth daily. Take with food (dinner) 30  tablet 1   ondansetron (ZOFRAN-ODT) 4 MG disintegrating tablet Take 1 tablet (4 mg total) by mouth every 8 (eight) hours as needed for nausea or vomiting. 20 tablet 0   oxyCODONE-acetaminophen (PERCOCET/ROXICET) 5-325 MG tablet Take 1 tablet by mouth every 6 (six) hours as needed for severe pain. 6 tablet 0   Prenatal Multivit-Min-Fe-FA (PRE-NATAL PO) Take 1 tablet by mouth daily.     propranolol (INDERAL) 20 MG tablet Take 1 tablet (20 mg total) by mouth 2 (two) times daily as needed (for anxiety). 60 tablet 1   spironolactone (ALDACTONE) 25 MG tablet Take 1 tablet (25 mg total) by mouth daily. 90 tablet 3   SYMBICORT 80-4.5 MCG/ACT inhaler Inhale 2 puffs into the lungs 2 (two) times daily.     venlafaxine XR (EFFEXOR-XR) 75 MG 24 hr capsule Take 3 capsules (225 mg total) by mouth daily. 90 capsule 1   naproxen (NAPROSYN) 500 MG tablet Take 1 tablet (500 mg total) by mouth 2 (two) times daily. (Patient not taking: Reported on 03/06/2022) 30 tablet 0    SH: Social History   Socioeconomic History   Marital status: Married    Spouse name: Not on file   Number of children: 3   Years of education: Not on file   Highest education level: 8th grade  Occupational History   Not on file  Tobacco Use   Smoking status: Never   Smokeless tobacco: Never   Tobacco comments:    She tried a cigarette and never smoked again  Vaping Use   Vaping Use: Never used  Substance and Sexual Activity   Alcohol use: Yes    Comment: bottle of wine a week-occ   Drug use: No   Sexual activity: Not Currently  Other Topics Concern   Not on file  Social History Narrative   Not on file   Social Determinants of Health   Financial Resource Strain: Not on file  Food Insecurity: No Food Insecurity (03/07/2022)   Hunger Vital Sign    Worried About Running Out of Food in the Last Year: Never true    Ran Out of Food in the Last Year: Never true  Transportation Needs: Unmet Transportation Needs (06/13/2021)    PRAPARE - Hydrologist (Medical): Yes    Lack of Transportation (Non-Medical): Yes  Physical Activity: Not on file  Stress: Not on file  Social Connections: Not on file    FH: Family History  Problem Relation Age of Onset   Hypertension Mother    Hypertension Father     Exam:  Van: OD: 20/200 eq OS: 4 pt [place]  CVF: OD: full OS: full  EOM: OD: full d/v OS: full d/v  Pupils: OD: 3->2 mm, possible APD OS: 3->2 mm, no APD  IOP: by [place] OD: 20 OS: 21  External: OD: no periorbital edema, no proptosis, V1-V3 intact and symmetric, good orbicularis strength OS: no periorbital edema, no proptosis, V1-V3 intact and symmetric, good orbicularis strength   ULE:  Pen Light Exam: L/L: OD: WNL OS: WNL  C/S: OD: white and quiet OS: white and quiet  K: OD: clear, no abnormal staining OS: clear, no abnormal staining  A/C: OD: grossly deep and quiet appearing by pen light OS: grossly deep and quiet appearing by pen light  I: OD: round and regular OS: round and regular  L: OD: clear OS: clear  DFE: dilated @ 5:50 PM w/ Tropic and Phenyl OU  V: OD: clear OS: clear  N: OD: C/D 0.35, no disc edema OS: C/D 0.35, no disc edema  M: OD: macula w/ CWS OS: flat, no obvious macular pathology  V: OD: retinal tortuosity  OS: normal appearing vessels  P: OD: retina flat 360, no obvious mass/RT/RD OS: retina flat 360, no obvious mass/RT/RD  A/P:  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 HTN Emergency: - No evidence of disc edema, exam limited at bedside - Possible mild APD will need further testing in clinic  Sameerah Nachtigal T. Manuella Ghazi, [place] [phone]

## 2022-03-08 DIAGNOSIS — I160 Hypertensive urgency: Secondary | ICD-10-CM | POA: Diagnosis not present

## 2022-03-08 LAB — COMPREHENSIVE METABOLIC PANEL
ALT: 16 U/L (ref 0–44)
AST: 18 U/L (ref 15–41)
Albumin: 4.6 g/dL (ref 3.5–5.0)
Alkaline Phosphatase: 46 U/L (ref 38–126)
Anion gap: 10 (ref 5–15)
BUN: 12 mg/dL (ref 6–20)
CO2: 25 mmol/L (ref 22–32)
Calcium: 9.2 mg/dL (ref 8.9–10.3)
Chloride: 105 mmol/L (ref 98–111)
Creatinine, Ser: 0.76 mg/dL (ref 0.44–1.00)
GFR, Estimated: >60 mL/min (ref >60)
Glucose, Bld: 111 mg/dL — ABNORMAL HIGH (ref 70–99)
Potassium: 3.2 mmol/L — ABNORMAL LOW (ref 3.5–5.1)
Sodium: 140 mmol/L (ref 135–145)
Total Bilirubin: 0.5 mg/dL (ref 0.3–1.2)
Total Protein: 7.5 g/dL (ref 6.5–8.1)

## 2022-03-08 LAB — CBC WITH DIFFERENTIAL/PLATELET
Abs Immature Granulocytes: 0.05 10*3/uL (ref 0.00–0.07)
Basophils Absolute: 0 10*3/uL (ref 0.0–0.1)
Basophils Relative: 0 %
Eosinophils Absolute: 0 10*3/uL (ref 0.0–0.5)
Eosinophils Relative: 0 %
HCT: 46.9 % — ABNORMAL HIGH (ref 36.0–46.0)
Hemoglobin: 15.8 g/dL — ABNORMAL HIGH (ref 12.0–15.0)
Immature Granulocytes: 0 %
Lymphocytes Relative: 19 %
Lymphs Abs: 2.2 10*3/uL (ref 0.7–4.0)
MCH: 32.7 pg (ref 26.0–34.0)
MCHC: 33.7 g/dL (ref 30.0–36.0)
MCV: 97.1 fL (ref 80.0–100.0)
Monocytes Absolute: 1 10*3/uL (ref 0.1–1.0)
Monocytes Relative: 9 %
Neutro Abs: 8.3 10*3/uL — ABNORMAL HIGH (ref 1.7–7.7)
Neutrophils Relative %: 72 %
Platelets: 383 10*3/uL (ref 150–400)
RBC: 4.83 MIL/uL (ref 3.87–5.11)
RDW: 12.4 % (ref 11.5–15.5)
WBC: 11.5 10*3/uL — ABNORMAL HIGH (ref 4.0–10.5)
nRBC: 0 % (ref 0.0–0.2)

## 2022-03-08 LAB — PHOSPHORUS: Phosphorus: 2.9 mg/dL (ref 2.5–4.6)

## 2022-03-08 LAB — MAGNESIUM: Magnesium: 2.4 mg/dL (ref 1.7–2.4)

## 2022-03-08 MED ORDER — DIPHENHYDRAMINE HCL 50 MG/ML IJ SOLN
25.0000 mg | Freq: Three times a day (TID) | INTRAMUSCULAR | Status: DC | PRN
  Administered 2022-03-08 – 2022-03-10 (×5): 25 mg via INTRAVENOUS
  Filled 2022-03-08 (×5): qty 1

## 2022-03-08 MED ORDER — POTASSIUM CHLORIDE CRYS ER 20 MEQ PO TBCR: 20.0000 meq | EXTENDED_RELEASE_TABLET | ORAL | Status: DC

## 2022-03-08 MED ORDER — LOSARTAN POTASSIUM 50 MG PO TABS
25.0000 mg | ORAL_TABLET | Freq: Every day | ORAL | Status: DC
  Administered 2022-03-08 – 2022-03-10 (×3): 25 mg via ORAL
  Filled 2022-03-08 (×3): qty 1

## 2022-03-08 MED ORDER — POTASSIUM CHLORIDE 20 MEQ PO PACK
40.0000 meq | PACK | Freq: Two times a day (BID) | ORAL | Status: AC
  Administered 2022-03-08: 40 meq via ORAL
  Filled 2022-03-08: qty 2

## 2022-03-08 MED ORDER — SPIRONOLACTONE 25 MG PO TABS
25.0000 mg | ORAL_TABLET | Freq: Every day | ORAL | Status: DC
  Administered 2022-03-08 – 2022-03-17 (×10): 25 mg via ORAL
  Filled 2022-03-08 (×11): qty 1

## 2022-03-08 MED ORDER — POTASSIUM CHLORIDE 10 MEQ/100ML IV SOLN
10.0000 meq | INTRAVENOUS | Status: AC
  Administered 2022-03-08 (×4): 10 meq via INTRAVENOUS
  Filled 2022-03-08 (×4): qty 100

## 2022-03-08 MED ORDER — ONDANSETRON 8 MG/NS 50 ML IVPB
8.0000 mg | Freq: Three times a day (TID) | INTRAVENOUS | Status: DC | PRN
  Administered 2022-03-08 – 2022-03-10 (×4): 8 mg via INTRAVENOUS
  Filled 2022-03-08: qty 54
  Filled 2022-03-08 (×3): qty 8

## 2022-03-08 MED ORDER — KETOROLAC TROMETHAMINE 15 MG/ML IJ SOLN
15.0000 mg | Freq: Four times a day (QID) | INTRAMUSCULAR | Status: AC
  Administered 2022-03-08 – 2022-03-09 (×4): 15 mg via INTRAVENOUS
  Filled 2022-03-08 (×4): qty 1

## 2022-03-08 NOTE — Progress Notes (Signed)
Moved patients BP cuff to upper right arm, BP 136/82. Titrated down on Cardene and notified MD. Will continue to use the arm and move BP when IV site needs to be moved.

## 2022-03-08 NOTE — Progress Notes (Signed)
PT Cancellation Note  Patient Details Name: Krystal [name] MRN: [id number] DOB: Jan 31, 1991   Cancelled Treatment:    Reason Eval/Treat Not Completed: PT screened, no needs identified, will sign off PER RN,  PATIENT IS INDEPENDENT IN MOBILITY.  [place] Office [phone] Weekend [phone]   Claretha Cooper 03/08/2022, 9:51 AM

## 2022-03-08 NOTE — Progress Notes (Signed)
NAME:  Krystal [name], MRN:  [id number], DOB:  Feb 05, 1991, LOS: 3 ADMISSION DATE:  03/05/2022, CONSULTATION DATE:  03/05/22 REFERRING MD:  Dr. Darl Householder, CHIEF COMPLAINT:  vision loss/ HA/ N/v  History of Present Illness:   32 year old female with prior hx as below, significant for chronic resistant hypertension who was sent from cardiology office for acute hypertension with right eye vision loss.   Patient with extensive history of hypertension diagnosed in her teens with prior unrevealing workup at [facility].  Most recently is followed by [facility] cardiology.  In 06/2021, patient was hospitalized for severe HTN, anisocoria, N/V, and headache while pregnant with her third child.  CTH/ MRI negative then.  Developed HELLP syndrome and underwent emergent c-section.    She went back to cardiology today, reporting that her BP's have been reading high in the 220/ 130s, despite compliance with her meds but also has been suffering from frequent N/V over the last 2 weeks.  Some question if patient is compliant with her HTN regimen given EMR review.  Developed intermittent vision loss in her right eye on 1/5 and double vision in the left.  Evaluated in ER on 1/7 with complaints of right eye vision issues and 7 days of abd pain with urinary symptoms.  CTH was negative, BP improved with hydralazine and discharged home on keflex for acute uncomplicated cystitis.  Was unable to get in with ophthalmologist till after 1/16.  Also reports constant headaches, intermittent chest tightness, and intermittent LE edema.  Severe anxiety and depression felt to be contributing to uncontrolled hypertension as patient's child is scheduled for open heart surgery soon.  Patient reports she uses edible THC at times to help with anxiety.   In ER, BP 229/155 with HR 78, afebrile and normoxia.  Was placed on cardene gtt for BP control and given ativan and morphine for cystitis pain.  CMET unremarkable, Hgb 16.1, trop hs neg.  MRI brain was  negative for acute intracranial abnormality.  PCCM called for admit.   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  Had episode of N/V after painful urination.   Objective   Blood pressure (!) 194/89, pulse (!) 101, temperature (!) 97.1 F (36.2 C), temperature source Axillary, resp. rate 14, height '5\' 11"'$  (1.803 m), weight 108.5 kg, last menstrual period 02/05/2022, SpO2 97 %, unknown if currently breastfeeding.        Intake/Output Summary (Last 24 hours) at 03/08/2022 1106 Last data filed at 03/08/2022 0800 Gross per 24 hour  Intake 1476.75 ml  Output 200 ml  Net 1276.75 ml   Filed Weights   03/05/22 1800 03/06/22 0500  Weight: 106.1 kg 108.5 kg    Examination: General: young woman in chair, moderate discomfort HEENT: [place]/AT, moist mucous membranes, sclera anicteric Neuro: A&O x 3, moving all extremities CV: rrr, s1s2, no murmurs PULM: clear to auscultation bilaterally. No wheezing GI: soft, non-tender, non-distended, BS+ Extremities: warm, no edema Skin: no rashes  Resolved Hospital Problem list    Assessment & Plan:   Acute on chronic resistant hypertensive urgency HA and vision change improving with BP control. Nonadherence, anxiety/ depression, situational stress, and cannabis use (and possible cannabis hyperemesis) complicating outpatient mgmt. -  previously intolerant to ACEi (cough) - cont to wean cardene gtt for SBP < 170 - attempt today to take oral's> norvasc '10mg'$ , propranolol '20mg'$  BID, losartan '25mg'$  daily and spironolactone daily and clonidine patch - has initial appt with Dr. Oval Linsey who specializes in resistant HTN on 04/09/2022  Nausea/ vomiting due to pain vs cyclic vomiting vs cannabis hyperemesis Reports over last two weeks.  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  Hypothyroidism - TSH 5.4 - resume home synthroid, nausea limiting adherence  Anxiety/ depression/ personality disorder - cont klonopin prn, latuda, effexor-XR when able to keep meds down - restart propranolol BID as above - inpatient psych consult pending> her outpt psychiatrist has been on leave reportedly and unable to get in.  Patient very agreeable to talk/ discuss her medication regimen as she feels like it is not helping and currently exacerbated by major life/ stress event upcoming with heart surgery on her child.   Acute uncomplicated cystitis  IC, flare - dx 1/7 in ER and started on keflex, but unable to keep most down - changed to CXT 1/11 as unable to take PO meds.   - continues to have severe pain with voiding, leading to ongoing N/V.  Spoke with urology to help> recs for IV toradol, PT consult for pelvic floor exercises, treat constipation, muscle relaxer> IV vs if able intravaginal valium '10mg'$  up to q6hrs if needed, minimize narcotics, and bladder scan prn    Best  Practice (right click and "Reselect all SmartList Selections" daily)   Diet/type: clear liquids DVT prophylaxis: prophylactic heparin  GI prophylaxis: N/A Lines: N/A Foley:  N/A Code Status:  full code Last date of multidisciplinary goals of care discussion [03/05/22]  Critical care time: 31 mins    Freda Jackson, MD [place] Office: [phone]   See Amion for personal pager PCCM on call pager [phone] until 7pm. Please call Elink 7p-7a. [phone]

## 2022-03-08 NOTE — Progress Notes (Signed)
Met with patient and family bedside. Patient was feeling discomfort but was welcoming as well as conversational. No particular needs expressed, Made patient and family aware a chaplain is always available for support if and when needed.

## 2022-03-08 NOTE — Progress Notes (Signed)
[facility] ADULT ICU REPLACEMENT PROTOCOL   The patient does apply for the [facility] Adult ICU Electrolyte Replacment Protocol based on the criteria listed below:   1.Exclusion criteria: TCTS, ECMO, Dialysis, and Myasthenia Gravis patients 2. Is GFR >/= 30 ml/min? Yes.    Patient's GFR today is >60 3. Is SCr </= 2? Yes.   Patient's SCr is 0.76 mg/dL 4. Did SCr increase >/= 0.5 in 24 hours? No. 5.Pt's weight >40kg  Yes.   6. Abnormal electrolyte(s): potassium 3.2  7. Electrolytes replaced per protocol 8.  Call MD STAT for K+ </= 2.5, Phos </= 1, or Mag </= 1 Physician:  n/a  Darlys Gales 03/08/2022 6:21 AM

## 2022-03-09 ENCOUNTER — Other Ambulatory Visit: Payer: Self-pay

## 2022-03-09 DIAGNOSIS — I160 Hypertensive urgency: Secondary | ICD-10-CM | POA: Diagnosis not present

## 2022-03-09 LAB — BASIC METABOLIC PANEL
Anion gap: 8 (ref 5–15)
BUN: 12 mg/dL (ref 6–20)
CO2: 23 mmol/L (ref 22–32)
Calcium: 9.1 mg/dL (ref 8.9–10.3)
Chloride: 108 mmol/L (ref 98–111)
Creatinine, Ser: 0.85 mg/dL (ref 0.44–1.00)
GFR, Estimated: >60 mL/min (ref >60)
Glucose, Bld: 97 mg/dL (ref 70–99)
Potassium: 4 mmol/L (ref 3.5–5.1)
Sodium: 139 mmol/L (ref 135–145)

## 2022-03-09 MED ORDER — BISACODYL 10 MG RE SUPP
10.0000 mg | Freq: Every day | RECTAL | Status: DC | PRN
  Administered 2022-03-09: 10 mg via RECTAL
  Filled 2022-03-09: qty 1

## 2022-03-09 MED ORDER — LORAZEPAM 2 MG/ML IJ SOLN
1.0000 mg | Freq: Once | INTRAMUSCULAR | Status: AC
  Administered 2022-03-09: 1 mg via INTRAVENOUS
  Filled 2022-03-09: qty 1

## 2022-03-09 MED ORDER — CLONAZEPAM 0.5 MG PO TBDP
2.0000 mg | ORAL_TABLET | Freq: Three times a day (TID) | ORAL | Status: DC
  Administered 2022-03-09 – 2022-03-11 (×6): 2 mg via ORAL
  Filled 2022-03-09 (×6): qty 4

## 2022-03-09 MED ORDER — KETOROLAC TROMETHAMINE 15 MG/ML IJ SOLN: 15.0000 mg | Freq: Four times a day (QID) | INTRAMUSCULAR | Status: DC

## 2022-03-09 MED ORDER — KETOROLAC TROMETHAMINE 15 MG/ML IJ SOLN
15.0000 mg | Freq: Four times a day (QID) | INTRAMUSCULAR | Status: AC
  Administered 2022-03-09 – 2022-03-10 (×4): 15 mg via INTRAVENOUS
  Filled 2022-03-09 (×4): qty 1

## 2022-03-09 NOTE — Progress Notes (Signed)
NAME:  Krystal [name], MRN:  [id number], DOB:  1990-07-01, LOS: 4 ADMISSION DATE:  03/05/2022, CONSULTATION DATE:  03/05/22 REFERRING MD:  Dr. Darl Householder, CHIEF COMPLAINT:  vision loss/ HA/ N/v  History of Present Illness:   32 year old female with prior hx as below, significant for chronic resistant hypertension who was sent from cardiology office for acute hypertension with right eye vision loss.   Patient with extensive history of hypertension diagnosed in her teens with prior unrevealing workup at [facility].  Most recently is followed by [facility] cardiology.  In 06/2021, patient was hospitalized for severe HTN, anisocoria, N/V, and headache while pregnant with her third child.  CTH/ MRI negative then.  Developed HELLP syndrome and underwent emergent c-section.    She went back to cardiology today, reporting that her BP's have been reading high in the 220/ 130s, despite compliance with her meds but also has been suffering from frequent N/V over the last 2 weeks.  Some question if patient is compliant with her HTN regimen given EMR review.  Developed intermittent vision loss in her right eye on 1/5 and double vision in the left.  Evaluated in ER on 1/7 with complaints of right eye vision issues and 7 days of abd pain with urinary symptoms.  CTH was negative, BP improved with hydralazine and discharged home on keflex for acute uncomplicated cystitis.  Was unable to get in with ophthalmologist till after 1/16.  Also reports constant headaches, intermittent chest tightness, and intermittent LE edema.  Severe anxiety and depression felt to be contributing to uncontrolled hypertension as patient's child is scheduled for open heart surgery soon.  Patient reports she uses edible THC at times to help with anxiety.   In ER, BP 229/155 with HR 78, afebrile and normoxia.  Was placed on cardene gtt for BP control and given ativan and morphine for cystitis pain.  CMET unremarkable, Hgb 16.1, trop hs neg.  MRI brain was  negative for acute intracranial abnormality.  PCCM called for admit.   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 rate 16, height '5\' 11"'$  (1.803 m), weight 108.5 kg, last menstrual period 02/05/2022, SpO2 96 %, unknown if currently breastfeeding.        Intake/Output Summary (Last 24 hours) at 03/09/2022 0904 Last data filed at 03/09/2022 0800 Gross per 24 hour  Intake 1208.91 ml  Output --  Net 1208.91 ml   Filed Weights   03/05/22 1800 03/06/22 0500  Weight: 106.1 kg 108.5 kg   Examination: General: young woman in chair, no acute distress HEENT: [place]/AT, moist mucous membranes, sclera anicteric Neuro: A&O x 3, moving all extremities CV: rrr, s1s2, no murmurs PULM: clear to auscultation bilaterally. No wheezing GI: soft, non-tender, non-distended, BS+ Extremities: warm, no edema Skin: no rashes   Resolved Hospital Problem list    Assessment & Plan:   Acute on chronic resistant hypertensive urgency HA  and vision change improving with BP control. Nonadherence, anxiety/ depression, situational stress, and cannabis use (and possible cannabis hyperemesis) complicating outpatient mgmt. - previously intolerant to ACEi (cough) - cont to wean cardene gtt for SBP < 170 - continue oral regimen: norvasc '10mg'$ , propranolol '20mg'$   BID, losartan '25mg'$  daily and spironolactone daily and clonidine patch - has initial appt with Dr. Oval Linsey who specializes in resistant HTN on 04/09/2022  Nausea/ vomiting due to pain vs cyclic vomiting vs cannabis hyperemesis Reports over last two weeks.  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  Hypothyroidism - TSH 5.4 - resume home synthroid, nausea limiting adherence  Anxiety/ depression/ personality disorder - cont klonopin scheduled, latuda, effexor-XR when able to keep meds down - inpatient psych consult rec continuing home meds - significant home social stress due to child needing open heart surgery  Acute uncomplicated cystitis  IC, flare - completed 3 days of ceftriaxone IV - toradol '15mg'$  q6hr, PRN dilaudid, gabapentin '300mg'$  BID - Spoke with urology to help> recs for IV toradol, PT consult for pelvic floor exercises, treat constipation, muscle relaxer> IV vs if able intravaginal valium '10mg'$  up to q6hrs if needed, minimize narcotics, and bladder scan prn    Best Practice (right click and "Reselect all SmartList Selections" daily)   Diet/type: Regular consistency (see orders) DVT prophylaxis: prophylactic heparin  GI prophylaxis: N/A Lines: N/A Foley:  N/A Code Status:  full code Last date of multidisciplinary goals of care discussion [03/05/22]  Critical care time: 31 mins    Freda Jackson, MD [place] Office: [phone]   See Amion for personal pager PCCM on  call pager [phone] until 7pm. Please call Elink 7p-7a. [phone]

## 2022-03-10 ENCOUNTER — Ambulatory Visit: Payer: Medicaid Other | Admitting: Family Medicine

## 2022-03-10 DIAGNOSIS — I160 Hypertensive urgency: Secondary | ICD-10-CM | POA: Diagnosis not present

## 2022-03-10 MED ORDER — DIAZEPAM 5 MG/ML IJ SOLN
2.5000 mg | Freq: Once | INTRAMUSCULAR | Status: AC | PRN
  Administered 2022-03-10: 2.5 mg via INTRAVENOUS
  Filled 2022-03-10: qty 2

## 2022-03-10 MED ORDER — DIPHENHYDRAMINE HCL 25 MG PO CAPS
25.0000 mg | ORAL_CAPSULE | Freq: Four times a day (QID) | ORAL | Status: DC | PRN
  Administered 2022-03-10: 25 mg via ORAL
  Filled 2022-03-10 (×3): qty 1

## 2022-03-10 MED ORDER — ONDANSETRON HCL 4 MG/2ML IJ SOLN: 4.0000 mg | Freq: Once | INTRAMUSCULAR | Status: AC

## 2022-03-10 MED ORDER — SODIUM CHLORIDE 0.9 % IV SOLN: INTRAVENOUS | Status: DC | PRN

## 2022-03-10 MED ORDER — OXYCODONE HCL 5 MG PO TABS
10.0000 mg | ORAL_TABLET | ORAL | Status: DC | PRN
  Administered 2022-03-10: 10 mg via ORAL
  Filled 2022-03-10: qty 2

## 2022-03-10 MED ORDER — HYDROMORPHONE HCL 1 MG/ML IJ SOLN
1.0000 mg | Freq: Once | INTRAMUSCULAR | Status: AC | PRN
  Administered 2022-03-10: 1 mg via INTRAVENOUS
  Filled 2022-03-10: qty 1

## 2022-03-10 MED ORDER — SODIUM CHLORIDE 0.9 % IV SOLN
25.0000 mg | Freq: Four times a day (QID) | INTRAVENOUS | Status: DC | PRN
  Administered 2022-03-10 – 2022-03-14 (×7): 25 mg via INTRAVENOUS
  Filled 2022-03-10 (×7): qty 25

## 2022-03-10 MED ORDER — ONDANSETRON 4 MG PO TBDP
4.0000 mg | ORAL_TABLET | Freq: Three times a day (TID) | ORAL | Status: DC | PRN
  Administered 2022-03-10 – 2022-03-17 (×15): 4 mg via ORAL
  Filled 2022-03-10 (×15): qty 1

## 2022-03-10 MED ORDER — HYDROMORPHONE HCL 1 MG/ML IJ SOLN
1.0000 mg | INTRAMUSCULAR | Status: DC | PRN
  Administered 2022-03-10 – 2022-03-14 (×18): 1 mg via INTRAVENOUS
  Filled 2022-03-10 (×20): qty 1

## 2022-03-10 MED ORDER — ONDANSETRON HCL 4 MG/2ML IJ SOLN
4.0000 mg | Freq: Once | INTRAMUSCULAR | Status: AC
  Administered 2022-03-10: 4 mg via INTRAVENOUS
  Filled 2022-03-10: qty 2

## 2022-03-10 MED ORDER — SCOPOLAMINE 1 MG/3DAYS TD PT72
1.0000 | MEDICATED_PATCH | TRANSDERMAL | Status: DC
  Administered 2022-03-13 – 2022-03-16 (×2): 1.5 mg via TRANSDERMAL
  Filled 2022-03-10 (×3): qty 1

## 2022-03-10 MED ORDER — NONFORMULARY OR COMPOUNDED ITEM
25.0000 mg | Freq: Four times a day (QID) | Status: DC | PRN
  Administered 2022-03-11 – 2022-03-14 (×3): 25 mg via ORAL
  Filled 2022-03-10 (×6): qty 1

## 2022-03-10 MED ORDER — NON FORMULARY: 25.0000 mg | Freq: Four times a day (QID) | Status: DC | PRN

## 2022-03-10 MED ORDER — OXYCODONE HCL 5 MG PO TABS
10.0000 mg | ORAL_TABLET | Freq: Three times a day (TID) | ORAL | Status: DC | PRN
  Administered 2022-03-10 – 2022-03-17 (×18): 10 mg via ORAL
  Filled 2022-03-10 (×19): qty 2

## 2022-03-10 MED ORDER — KETOROLAC TROMETHAMINE 10 MG PO TABS
10.0000 mg | ORAL_TABLET | Freq: Four times a day (QID) | ORAL | Status: DC | PRN
  Administered 2022-03-10 – 2022-03-14 (×6): 10 mg via ORAL
  Filled 2022-03-10 (×9): qty 1

## 2022-03-10 MED ORDER — DIPHENHYDRAMINE HCL 25 MG PO CAPS: 25.0000 mg | ORAL_CAPSULE | Freq: Four times a day (QID) | ORAL | Status: DC | PRN

## 2022-03-10 MED ORDER — PANTOPRAZOLE SODIUM 40 MG PO TBEC
40.0000 mg | DELAYED_RELEASE_TABLET | Freq: Every day | ORAL | Status: DC
  Administered 2022-03-10: 40 mg via ORAL
  Filled 2022-03-10: qty 1

## 2022-03-10 NOTE — Progress Notes (Deleted)
Patient name: Krystal [name] Medical record number: [id number] Date of Birth: [birth year] Age: 32 y.o. Gender: female  Krystal [name] is a 32 y.o. female here to establish care.   Subjective  Concerns today:  Medications:  Medical Problems: HTN, GERD, Hashimoto's thyroiditis, PTSD, MDD, BPD  Social history: Prior PCP:  Last went to doctor: Occupation:  Lives with:  Smoking/Vaping:   Alcohol:  Marijuana/ilicit substances:   Exercise:    Allergies:   Surgical history:  Family history:   Health Maintenance:  Objective  LMP 02/05/2022 (Exact Date) Comment: pt had tubaligation  Physical Exam  Assessment and Plan  There are no diagnoses linked to this encounter. No follow-ups on file.  Salvadore Oxford, MD, PGY-1 [facility] 8:06 AM 03/10/2022

## 2022-03-10 NOTE — Progress Notes (Signed)
PCCM interval progress note:  Have tried to transition from IV pain medications and anti-emetics to po, pt has been continually vomiting and crying out.  Will resume decreased Dilaudid from '1mg'$  q2hr to q6hrs and IV zofran.  Attempt to decrease IV medications as able tomorrow.  Otilio Carpen Petronella Shuford, PA-C

## 2022-03-10 NOTE — Progress Notes (Signed)
.   Transition of Care (TOC) Screening Note   Patient Details  Name: Krystal [name] Date of Birth: 08/29/1990   Transition of Care [facility]) CM/SW Contact:    Roseanne Kaufman, RN Phone Number: 03/10/2022, 5:12 PM    Transition of Care Department [employer]) has reviewed patient and no TOC needs have been identified at this time. We will continue to monitor patient advancement through interdisciplinary progression rounds. If new patient transition needs arise, please place a TOC consult.

## 2022-03-10 NOTE — Progress Notes (Signed)

## 2022-03-10 NOTE — Progress Notes (Addendum)
NAME:  Krystal [name], MRN:  [id number], DOB:  04-17-1990, LOS: 5 ADMISSION DATE:  03/05/2022, CONSULTATION DATE:  03/05/22 REFERRING MD:  Dr. Darl Householder, CHIEF COMPLAINT:  vision loss/ HA/ N/v  History of Present Illness:   32 year old female with prior hx as below, significant for chronic resistant hypertension who was sent from cardiology office for acute hypertension with right eye vision loss.   Patient with extensive history of hypertension diagnosed in her teens with prior unrevealing workup at [facility].  Most recently is followed by [facility] cardiology.  In 06/2021, patient was hospitalized for severe HTN, anisocoria, N/V, and headache while pregnant with her third child.  CTH/ MRI negative then.  Developed HELLP syndrome and underwent emergent c-section.    She went back to cardiology today, reporting that her BP's have been reading high in the 220/ 130s, despite compliance with her meds but also has been suffering from frequent N/V over the last 2 weeks.  Some question if patient is compliant with her HTN regimen given EMR review.  Developed intermittent vision loss in her right eye on 1/5 and double vision in the left.  Evaluated in ER on 1/7 with complaints of right eye vision issues and 7 days of abd pain with urinary symptoms.  CTH was negative, BP improved with hydralazine and discharged home on keflex for acute uncomplicated cystitis.  Was unable to get in with ophthalmologist till after 1/16.  Also reports constant headaches, intermittent chest tightness, and intermittent LE edema.  Severe anxiety and depression felt to be contributing to uncontrolled hypertension as patient's child is scheduled for open heart surgery soon.  Patient reports she uses edible THC at times to help with anxiety.   In ER, BP 229/155 with HR 78, afebrile and normoxia.  Was placed on cardene gtt for BP control and given ativan and morphine for cystitis pain.  CMET unremarkable, Hgb 16.1, trop hs neg.  MRI brain was  negative for acute intracranial abnormality.  PCCM called for admit.   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 rate 15, height '5\' 11"'$  (1.803 m), weight 108.5 kg, last menstrual period 02/05/2022, SpO2 93 %, unknown if currently breastfeeding.        Intake/Output Summary (Last 24 hours) at 03/10/2022 0810 Last data filed at 03/10/2022 0453 Gross per 24 hour  Intake 151.51 ml  Output 300 ml  Net -148.49 ml    Filed Weights   03/05/22 1800 03/06/22 0500  Weight: 106.1 kg 108.5 kg     General:  resting in bed, talking on telephone in no acute distress, tearful on conversation HEENT: MM pink/moist, sclera anicteric Neuro: awake, alert, oriented CV: s1s2 rrr, no m/r/g PULM:  clear bilaterally on RA without distress GI: soft, bsx4 active, TTP suprapubic area  Extremities: warm/dry, no edema  Skin: no rashes or lesions    Resolved Hospital Problem list  Assessment & Plan:   Acute on chronic resistant hypertensive urgency HA and vision change improving with BP control. Nonadherence, anxiety/ depression, situational stress, and cannabis use (and possible cannabis hyperemesis) complicating outpatient mgmt. - previously intolerant to ACEi (cough) - cardene weaned off - continue oral regimen:  norvasc '10mg'$ , propranolol '20mg'$  BID, losartan '25mg'$  daily and spironolactone daily and clonidine patch - has initial appt with Dr. 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 within 1-2 weeks of discharge.  Hypothyroidism - TSH 5.4 - resume home synthroid, nausea limiting adherence  Anxiety/ depression/ personality disorder - cont klonopin scheduled, latuda, effexor-XR  - seen by inpatient psychiatry 1/12, recommended standard observation, above medicaitons - significant home social stress due to child needing open heart surgery  Acute uncomplicated cystitis  IC, flare - completed 3 days of ceftriaxone IV - toradol '15mg'$  q6hr, PRN dilaudid, gabapentin '300mg'$  BID - Urology recommended IV toradol, PT consult for pelvic floor exercises, treat constipation, muscle relaxer> IV vs if able intravaginal valium '10mg'$  up to q6hrs if needed, minimize narcotics, and bladder scan prn    Best Practice (right click and "Reselect all SmartList Selections" daily)   Diet/type: Regular consistency (see orders) DVT prophylaxis: prophylactic heparin  GI prophylaxis: N/A Lines: N/A Foley:  N/A Code Status:  full code Last date of multidisciplinary goals of care discussion [03/05/22]  Critical care time:     Otilio Carpen Betsie Peckman, PA-C [facility] See Amion for  pager If no response to pager , please call [phone] until 7pm After 7:00 pm call Elink  [phone]

## 2022-03-11 ENCOUNTER — Inpatient Hospital Stay (HOSPITAL_COMMUNITY): Payer: Medicaid Other

## 2022-03-11 DIAGNOSIS — I169 Hypertensive crisis, unspecified: Secondary | ICD-10-CM | POA: Diagnosis not present

## 2022-03-11 DIAGNOSIS — K2100 Gastro-esophageal reflux disease with esophagitis, without bleeding: Secondary | ICD-10-CM | POA: Diagnosis present

## 2022-03-11 DIAGNOSIS — H348312 Tributary (branch) retinal vein occlusion, right eye, stable: Secondary | ICD-10-CM

## 2022-03-11 DIAGNOSIS — R112 Nausea with vomiting, unspecified: Secondary | ICD-10-CM | POA: Diagnosis not present

## 2022-03-11 DIAGNOSIS — F4310 Post-traumatic stress disorder, unspecified: Secondary | ICD-10-CM | POA: Diagnosis not present

## 2022-03-11 DIAGNOSIS — H348112 Central retinal vein occlusion, right eye, stable: Secondary | ICD-10-CM | POA: Diagnosis present

## 2022-03-11 DIAGNOSIS — E038 Other specified hypothyroidism: Secondary | ICD-10-CM

## 2022-03-11 DIAGNOSIS — E039 Hypothyroidism, unspecified: Secondary | ICD-10-CM | POA: Diagnosis present

## 2022-03-11 LAB — BASIC METABOLIC PANEL
Anion gap: 10 (ref 5–15)
BUN: 12 mg/dL (ref 6–20)
CO2: 28 mmol/L (ref 22–32)
Calcium: 9.4 mg/dL (ref 8.9–10.3)
Chloride: 100 mmol/L (ref 98–111)
Creatinine, Ser: 0.94 mg/dL (ref 0.44–1.00)
GFR, Estimated: >60 mL/min (ref >60)
Glucose, Bld: 107 mg/dL — ABNORMAL HIGH (ref 70–99)
Potassium: 3.9 mmol/L (ref 3.5–5.1)
Sodium: 138 mmol/L (ref 135–145)

## 2022-03-11 LAB — CBC
HCT: 44 % (ref 36.0–46.0)
Hemoglobin: 14.5 g/dL (ref 12.0–15.0)
MCH: 32.7 pg (ref 26.0–34.0)
MCHC: 33 g/dL (ref 30.0–36.0)
MCV: 99.1 fL (ref 80.0–100.0)
Platelets: 296 10*3/uL (ref 150–400)
RBC: 4.44 MIL/uL (ref 3.87–5.11)
RDW: 12 % (ref 11.5–15.5)
WBC: 7.9 10*3/uL (ref 4.0–10.5)
nRBC: 0 % (ref 0.0–0.2)

## 2022-03-11 MED ORDER — LOSARTAN POTASSIUM 50 MG PO TABS
100.0000 mg | ORAL_TABLET | Freq: Every day | ORAL | Status: DC
  Administered 2022-03-11 – 2022-03-17 (×7): 100 mg via ORAL
  Filled 2022-03-11 (×8): qty 2

## 2022-03-11 MED ORDER — VENLAFAXINE HCL ER 75 MG PO CP24
150.0000 mg | ORAL_CAPSULE | Freq: Every day | ORAL | Status: DC
  Administered 2022-03-11 – 2022-03-12 (×2): 150 mg via ORAL
  Filled 2022-03-11 (×2): qty 2

## 2022-03-11 MED ORDER — LORAZEPAM 2 MG/ML IJ SOLN
2.0000 mg | Freq: Once | INTRAMUSCULAR | Status: AC
  Administered 2022-03-11: 2 mg via INTRAVENOUS
  Filled 2022-03-11: qty 1

## 2022-03-11 MED ORDER — ENSURE ENLIVE PO LIQD
237.0000 mL | Freq: Three times a day (TID) | ORAL | Status: DC
  Administered 2022-03-12 – 2022-03-14 (×4): 237 mL via ORAL

## 2022-03-11 MED ORDER — PANTOPRAZOLE SODIUM 40 MG IV SOLR
40.0000 mg | Freq: Two times a day (BID) | INTRAVENOUS | Status: DC
  Administered 2022-03-11 – 2022-03-16 (×11): 40 mg via INTRAVENOUS
  Filled 2022-03-11 (×11): qty 10

## 2022-03-11 MED ORDER — CHLORHEXIDINE GLUCONATE CLOTH 2 % EX PADS: 6.0000 | MEDICATED_PAD | Freq: Every evening | CUTANEOUS | Status: DC

## 2022-03-11 MED ORDER — OXYBUTYNIN CHLORIDE 5 MG PO TABS
5.0000 mg | ORAL_TABLET | Freq: Three times a day (TID) | ORAL | Status: DC | PRN
  Administered 2022-03-11 – 2022-03-16 (×6): 5 mg via ORAL
  Filled 2022-03-11 (×6): qty 1

## 2022-03-11 MED ORDER — DIAZEPAM 5 MG/ML IJ SOLN
2.5000 mg | Freq: Once | INTRAMUSCULAR | Status: AC | PRN
  Administered 2022-03-11: 2.5 mg via INTRAVENOUS
  Filled 2022-03-11: qty 2

## 2022-03-11 MED ORDER — CLONAZEPAM 0.125 MG PO TBDP
1.0000 mg | ORAL_TABLET | Freq: Three times a day (TID) | ORAL | Status: DC
  Administered 2022-03-11: 1 mg via ORAL
  Filled 2022-03-11: qty 2

## 2022-03-11 MED ORDER — CLONAZEPAM 1 MG PO TABS
1.0000 mg | ORAL_TABLET | Freq: Three times a day (TID) | ORAL | Status: DC
  Administered 2022-03-11 – 2022-03-12 (×2): 1 mg via ORAL
  Filled 2022-03-11 (×2): qty 1

## 2022-03-11 MED ORDER — VENLAFAXINE HCL ER 150 MG PO CP24
150.0000 mg | ORAL_CAPSULE | Freq: Every day | ORAL | Status: DC
  Filled 2022-03-11: qty 1

## 2022-03-11 MED ORDER — LORAZEPAM 2 MG/ML IJ SOLN: 2.0000 mg | Freq: Three times a day (TID) | INTRAMUSCULAR | Status: DC | PRN

## 2022-03-11 NOTE — Assessment & Plan Note (Addendum)
Continue Synthroid °

## 2022-03-11 NOTE — Assessment & Plan Note (Addendum)
Chronic issue. Likely complicating current admission. Patient declined oxybutynin. Worsening pain. Repeat urinalysis unremarkable and repeat urine culture with no growth. Patient required in/out cath x1 but resumed spontaneous voiding. No abnormalities noted on external GU exam. Discussed case with urology on-call, Dr. Jeffie Pollock, who confirmed  patient was previously seen at his office but referred to Dr. Amalia Hailey at [facility]. Oxycodone provided for symptom relief.

## 2022-03-11 NOTE — Assessment & Plan Note (Addendum)
See problem, Anxiety

## 2022-03-11 NOTE — Progress Notes (Signed)
Chaplain engaged in an initial visit with [place].  Shalice became very nauseous and threw up.  Chaplain let nurse know this because Terrell had just been given her medicine.  Chaplain was able to get Ailie a wash cloth to clean her face and put on the back of her neck for comfort.    After [place] found some relief, Chaplain was able to engage in reflective listening as Derenda shared her story and concerns.  Zarianna was very tearful throughout the visit as she talked about her health and severe anxiety.  She voiced that she has not been able to sleep due to nightmares.  One of Jozelynn's daughters is having open heart surgery soon and that has been a source of a lot of stress for [place].  She voiced that she has not been able to sleep due to worrying about her daughter.  Being away from her children, and the fear of not being there when her daughter has surgery, has weighed heavily on her.  Chaplain could assess Victoriya being very overwhelmed.  She also talked about feeling worthless as a mom and wife as she is always battling in her health and mind, which creates distance between her and her children.   Keir discussed her mental healthcare battles and challenges.  She stated that she feels like she is in "disarray" and that she feels like she "is splitting."  Chaplain affirmed her emotions and current state.  Chaplain asked Coley what has worked best for her in the past and she stated that she has been on a number of medications.  She voiced that Valium makes her feel "loopy" and keeps her from sleeping.  She communicated that Ativan was really helpful for her when she got an MRI.  Chaplain worked to provide some peace around the medical team working to find the best regimen for her.    Nadean detailed that she feels really hopeless and far from God at this time.  Chaplain worked to illuminate the ways that God is still present with her even through suffering.  Spirituality, prayer, and  thinking about her faith are apart of Mabry's framework.  Chaplain utilized prayer to bring comfort and encouragement to her, as well as acknowledge the many things she is facing right now.    Cree appeared to try and bring some encouragement to herself by thinking about getting her GED, working in the future, and even declaring that God gave her, her daughters for a reason.  Chaplain did hear some positive momentary shifts in her perspective.  Chaplain does believe it would be helpful for Yazmeen to speak with Social Work for resources and help around getting disability or helping to support their one-income household in some way. Chaplain also believes a Psych consult would be beneficial as [place] acknowledges that she is not mentally well.   Chaplain offered a compassionate presence, provided a spiritual assessment, engaged in reflection, and offered spiritual support and care.     03/11/22 1048  Spiritual Encounters  Type of Visit Initial  Care provided to: Patient  Reason for visit Trauma  Spiritual Framework  Presenting Themes Courage hope and growth;Significant life change;Values and beliefs;Meaning/purpose/sources of inspiration;Goals in life/care;Impactful experiences and emotions  Community/Connection Significant other;Limited  Needs/Challenges/Barriers Emotional, Mental Regulation, Appropriate medication  Patient Stress Factors Health changes;Loss of control;Major life changes;Exhausted  Goals  Self/Personal Goals Gain hope, new outlook on life  Clinical Care Goals Find correct medication that works for mental, emotional state  Interventions  Spiritual Care Interventions Made Prayer;Encouragement;Reflective listening;Compassionate presence;Established relationship of care and support;Normalization of emotions;Self-care teaching  Intervention Outcomes  Outcomes Connection to spiritual care;Awareness around self/spiritual resourses;Connection to values and goals of  care;Patient family open to resources;Reduced isolation;Awareness of support  Spiritual Care Plan  Spiritual Care Issues Still Outstanding Chaplain will continue to follow  Recommendations for Clinical Staff Social Work and [employer]

## 2022-03-11 NOTE — Progress Notes (Signed)
Tried to call report to [place]. Awaiting call back

## 2022-03-11 NOTE — Assessment & Plan Note (Addendum)
Patient evaluated by ophthalmology, Dr. Manuella Ghazi with recommendation for follow-up in 1-2 weeks post-discharge

## 2022-03-11 NOTE — Progress Notes (Signed)
[facility] Progress Note Patient Name: Krystal [name] DOB: 1990/05/26 MRN: [id number]   Date of Service  03/11/2022  HPI/Events of Note  Anxiety.  Asking for meds. Home meds reviewed.  Camera: Sitting in chair. VS stable. On pain meds prn. BMI elevated   eICU Interventions  Valium iv prn once  low dose. On room air.      Intervention Category Minor Interventions: Agitation / anxiety - evaluation and management  Elmer Sow 03/11/2022, 4:33 AM

## 2022-03-11 NOTE — Progress Notes (Addendum)
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  Upon evaluation in the emergency department blood pressures continue to be as high as 229/155 and due to patient's neurologic symptoms visual changes EDP contacted PCCM for admission for Cardene infusion to the intensive care unit.  MRI performed in the emergency department was unremarkable.  Patient was additionally found to have a urinalysis suggestive of urinary tract infection consistent with cystitis.  Patient was treated with a course of 3 days of ceftriaxone.  Concerning patient's vision loss Dr. Manuella Ghazi with [facility] was consulted on 1/12 and patient was identified to have a branch retinal vein occlusion with cystoid macular edema of the right eye.  Ophthalmology recommended outpatient follow-up with [facility] for possible injection 1 to 2 weeks after discharge.  For assistance with management of patient's mental health diagnoses, Dr. Leverne Humbles psychiatry was consulted with assistance and adjustment of her psychotropic medications.  In the days that followed admission blood pressure gradually decreased with combination of Cardene and resumption of home antihypertensives.   Headaches and visual  changes that the patient had presented with had resolved.   Assessment and Plan: * 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 Childhood hypertension diagnosed age of 79 with negative secondary workup at [facility] Patient presenting with new right eye vision loss found to have markedly elevated blood pressures Patient initially admitted to PCCM and placed on Cardene infusion, now transferred to the hospital service today Blood pressures continue to be high and quite labile Patient not exhibiting any chest pain, respiratory distress, or continued neurologic deficits Elevated blood pressures complicated by ongoing intense bouts of anxiety and tearfulness with frequent bouts of vomiting and inability to consistently keep down oral antihypertensives. Currently on regimen of amlodipine, clonidine, losartan, propranolol, spironolactone As needed intravenous antihypertensives for continued elevated blood pressures.  Intractable nausea and vomiting Patient suffering from longstanding frequent bouts of vomiting and chronic abdominal pain. On this hospitalization, patient exhibiting frequent bouts of nonbloody vomitus throughout the day making getting the patient to keep down her antihypertensives a challenge EGD 04/2020 unremarkable by [facility] GI Patient status post cholecystectomy for suspected biliary dyskinesia with no improvement Recent CT imaging of the abdomen and pelvis with contrast performed 1/7 reveals no acute findings Due to frequent bouts of vomiting today I obtained a abdominal x-ray which reveals no evidence of obstruction. Abdominal exam is benign I therefore discussed the case with Dr. Lovette Cliche with psychiatry as I feel that there is a psychiatric cause for the patient's frequent vomiting.  PTSD (post-traumatic stress disorder) Patient continually tearful and anxious, reporting that she is continually repeating the events of her prior pregnancies and her head Patient seems  to suffer from severe PTSD related to complicated pregnancies in the past including HEELP syndrome S/P emergent C-section in 06/2021 I discussed the case with Dr. Lovette Cliche with psychiatry who is continuing to try  to taper patient's schedule clonazepam (and states that PRN IV Lorazepam is ok if absolutely needed for now).   She additionally recommends the reinitiation of venlafaxine and is concerned that this medication not being given during the hospitalization thus far has possibly exacerbated her anxiety Appreciate psychiatry's recommendations  Retinal vein occlusion of right eye Identified by Dr. Manuella Ghazi with [facility] upon consultation on 1/12 Patient is to follow-up with Dr. Brigitte Pulse as an outpatient 1 to 2 weeks after discharge for consideration of injection  Interstitial cystitis Patient reports longstanding history of interstitial cystitis Admitting PCCM team felt the patient's urinalysis was suggestive of urinary tract infection and treated the patient with 3 days of intravenous antibiotics for suspected acute bacterial cystitis Despite this, patient continues to complain of chronic lower abdominal pain and burning I have offered as needed of oxybutynin which she has declined Outpatient follow-up  Hypothyroidism Resume home regimen of Synthroid   GERD with esophagitis Patient complains of reflux symptoms due to frequent vomiting. Patient patient on Protonix 40 mg IV every 12 for now which can be transition to 40 mg by mouth once daily once improved.    Subjective:  Patient extremely tearful and anxious throughout the interview regularly saying "I feel like I have let my babies down."  Patient denying chest pain shortness of breath or visual changes at this time.  Physical Exam:  Vitals:   03/11/22 1600 03/11/22 1705 03/11/22 1800 03/11/22 2132  BP: (!) 155/112 (!) 174/106 (!) 151/94 135/85  Pulse: (!) 102 97 93 84  Resp: (!) '23 12 14 18  '$ Temp:    98.2 F (36.8  C)  TempSrc:    Oral  SpO2: 95% 98% 91% 96%  Weight:      Height:        Constitutional: Awake alert and oriented x3, patient in distress due to extreme anxiety. Skin: Numerous scars of the bilateral arms.  No rashes, no lesions, good skin turgor noted. Eyes: Pupils are equally reactive to light.  No evidence of scleral icterus or conjunctival pallor.  ENMT: Moist mucous membranes noted.  Posterior pharynx clear of any exudate or lesions.   Respiratory: clear to auscultation bilaterally, no wheezing, no crackles. Normal respiratory effort. No accessory muscle use.  Cardiovascular: Regular rate and rhythm, no murmurs / rubs / gallops. No extremity edema. 2+ pedal pulses. No carotid bruits.  Abdomen: Significant lower Donnell tenderness.  Abdomen is soft.  No evidence of intra-abdominal masses.  Positive bowel sounds noted in all quadrants.   Musculoskeletal: Status post left hand amputation.  Good ROM, no contractures. Normal muscle tone.  Psych: Extremely tearful throughout the interview, labile affect, denies SI/HI.   Data Reviewed:  I have personally reviewed and interpreted labs, imaging.  Significant findings are   CBC: Recent Labs  Lab 03/05/22 1206 03/06/22 0328 03/08/22 0432 03/11/22 0309  WBC 8.4 11.1* 11.5* 7.9  NEUTROABS 5.1  --  8.3*  --   HGB 16.1* 17.5* 15.8* 14.5  HCT 46.4* 51.0* 46.9* 44.0  MCV 94.5 96.6 97.1 99.1  PLT 314 340 383 195   Basic Metabolic Panel: Recent Labs  Lab 03/06/22 0328 03/07/22 0323 03/08/22 0526 03/09/22 0312 03/11/22 0309  NA 137 137 140 139 138  K 3.5 3.8 3.2* 4.0 3.9  CL 102 107 105 108 100  CO2 25 19* '25 23 28  '$ GLUCOSE 124* 120* 111* 97 107*  BUN '14 13 12 12 12  '$ CREATININE 0.76 0.82 0.76 0.85 0.94  CALCIUM 9.0 9.0 9.2 9.1 9.4  MG  --   --  2.4  --   --   PHOS  --   --  2.9  --   --    GFR: Estimated Creatinine Clearance: 115.6 mL/min (by C-G formula based on SCr of 0.94 mg/dL). Liver Function Tests: Recent Labs   Lab 03/05/22 1206 03/08/22 0526  AST 23 18  ALT 21 16  ALKPHOS 54 46  BILITOT 0.5 0.5  PROT 8.1 7.5  ALBUMIN 4.7 4.6    Coagulation Profile: No results for input(s): "INR", "PROTIME" in the last 168 hours.   Telemetry: Personally reviewed.  Rhythm is sinus tachycardia 110 bpm   Code Status:  Full code.  Code status decision has been confirmed with: patient    Severity of Illness:  The appropriate patient status for this patient is INPATIENT. Inpatient status is judged to be reasonable and necessary in order to provide the required intensity of service to ensure the patient's safety. The patient's presenting symptoms, physical exam findings, and initial radiographic and laboratory data in the context of their chronic comorbidities is felt to place them at high risk for further clinical deterioration. Furthermore, it is not anticipated that the patient will be medically stable for discharge from the hospital within 2 midnights of admission.   * I certify that at the point of admission it is my clinical judgment that the patient will require inpatient hospital care spanning beyond 2 midnights from the point of admission due to high intensity of service, high risk for further deterioration and high frequency of surveillance required.*  Time spent:  60 minutes  Author:  Vernelle Emerald MD  03/11/2022 9:46 PM

## 2022-03-11 NOTE — Consult Note (Signed)
Brief Psychiatry Consult Note  The patient was last seen by the psychiatry service on 1/12; repeat consult put in today in the afternoon. Unfortunately, cannot see until tomorrow AM. Interim documentation by primary team and nursing staff has been reviewed, as well as outpt records. Please see last consult note for full assessment. It seems that her venlafaxine was not started at admission - d/w hospitalist - there was a plan from ICU to restart once keeping meds down, and it doesn't seem to have been driving recent elevated BP.. I wonder how much of her current and worsening anxiety over the past couple of days, etc is 2/2 venlafaxine discontinuation - potentially significant benefit to restarting this albeit at lower dose. Clonazepam increased to 2 TID on Sunday by ICU doctors; this is 6x her current home dose (taking 1 mg most Ams) - per primary, difficulty keeping this down and getting IV ativan on top anyway. This is not a dose that can be maintained safely outside the hospital, so will reduce to 1 TID for today and reassess tomorrow.    Final medication recommendations are as follows:  - START venlafaxine 150 mg today  - DECREASE clonazepam to 1 TID   We will see pt in AM tomorrow. This has been communicated to the primary team. If issues arise in the future, don't hesitate to reconsult the Psychiatry Inpatient Consult Service.   Yaeli Hartung A [place] Virrueta

## 2022-03-11 NOTE — Assessment & Plan Note (Addendum)
Persistent. Possibly contributed to by severe anxiety. History of THC use which could also be contributing. No evidence of bowel obstruction. Psychiatry consulted for concern of behavioral health component. GI consulted today with concern for possible gastroparesis vs multifactorial cause. GI recommended gastric emptying study, however patient cannot tolerate at this time. GI started erythromycin and patient appears to be responding positively. Nausea/vomiting significantly improved. GI recommendation to continue erythromycin on discharge in addition to close GI follow-up.

## 2022-03-11 NOTE — Hospital Course (Addendum)
Krystal [name] is a 32 y.o. 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 Patient presented secondary to right eye vision loss with associated severely elevated hypertension consistent with hypertensive emergency. Patient was admitted to the ICU for Cardene IV infusion to better control hypertension. Hospitalization has been complicated by intractable nausea/vomiting which is responding to erythromycin.

## 2022-03-11 NOTE — Assessment & Plan Note (Addendum)
Discharge on clonidine patch, losartan, amlodipine and spironolactone. Hydralazine discontinued on discharge.

## 2022-03-11 NOTE — Assessment & Plan Note (Addendum)
Protonix while inpatient. Resume Pepcid on discharge.

## 2022-03-12 DIAGNOSIS — I169 Hypertensive crisis, unspecified: Secondary | ICD-10-CM | POA: Diagnosis not present

## 2022-03-12 DIAGNOSIS — F4310 Post-traumatic stress disorder, unspecified: Secondary | ICD-10-CM | POA: Diagnosis not present

## 2022-03-12 DIAGNOSIS — F419 Anxiety disorder, unspecified: Secondary | ICD-10-CM

## 2022-03-12 DIAGNOSIS — E039 Hypothyroidism, unspecified: Secondary | ICD-10-CM

## 2022-03-12 DIAGNOSIS — N3010 Interstitial cystitis (chronic) without hematuria: Secondary | ICD-10-CM

## 2022-03-12 DIAGNOSIS — R112 Nausea with vomiting, unspecified: Secondary | ICD-10-CM | POA: Diagnosis not present

## 2022-03-12 LAB — COMPREHENSIVE METABOLIC PANEL
ALT: 28 U/L (ref 0–44)
AST: 28 U/L (ref 15–41)
Albumin: 3.8 g/dL (ref 3.5–5.0)
Alkaline Phosphatase: 49 U/L (ref 38–126)
Anion gap: 8 (ref 5–15)
BUN: 16 mg/dL (ref 6–20)
CO2: 29 mmol/L (ref 22–32)
Calcium: 9.3 mg/dL (ref 8.9–10.3)
Chloride: 97 mmol/L — ABNORMAL LOW (ref 98–111)
Creatinine, Ser: 0.83 mg/dL (ref 0.44–1.00)
GFR, Estimated: >60 mL/min (ref >60)
Glucose, Bld: 130 mg/dL — ABNORMAL HIGH (ref 70–99)
Potassium: 4.4 mmol/L (ref 3.5–5.1)
Sodium: 134 mmol/L — ABNORMAL LOW (ref 135–145)
Total Bilirubin: 0.5 mg/dL (ref 0.3–1.2)
Total Protein: 6.8 g/dL (ref 6.5–8.1)

## 2022-03-12 LAB — CBC WITH DIFFERENTIAL/PLATELET
Abs Immature Granulocytes: 0.03 10*3/uL (ref 0.00–0.07)
Basophils Absolute: 0 10*3/uL (ref 0.0–0.1)
Basophils Relative: 0 %
Eosinophils Absolute: 0 10*3/uL (ref 0.0–0.5)
Eosinophils Relative: 0 %
HCT: 43.3 % (ref 36.0–46.0)
Hemoglobin: 14.6 g/dL (ref 12.0–15.0)
Immature Granulocytes: 0 %
Lymphocytes Relative: 22 %
Lymphs Abs: 1.8 10*3/uL (ref 0.7–4.0)
MCH: 32.8 pg (ref 26.0–34.0)
MCHC: 33.7 g/dL (ref 30.0–36.0)
MCV: 97.3 fL (ref 80.0–100.0)
Monocytes Absolute: 1 10*3/uL (ref 0.1–1.0)
Monocytes Relative: 12 %
Neutro Abs: 5.3 10*3/uL (ref 1.7–7.7)
Neutrophils Relative %: 66 %
Platelets: 275 10*3/uL (ref 150–400)
RBC: 4.45 MIL/uL (ref 3.87–5.11)
RDW: 12 % (ref 11.5–15.5)
WBC: 8.1 10*3/uL (ref 4.0–10.5)
nRBC: 0 % (ref 0.0–0.2)

## 2022-03-12 LAB — RAPID URINE DRUG SCREEN, HOSP PERFORMED
Amphetamines: NOT DETECTED
Barbiturates: NOT DETECTED
Benzodiazepines: POSITIVE — AB
Cocaine: NOT DETECTED
Opiates: POSITIVE — AB
Tetrahydrocannabinol: POSITIVE — AB

## 2022-03-12 LAB — MAGNESIUM: Magnesium: 2.2 mg/dL (ref 1.7–2.4)

## 2022-03-12 MED ORDER — SUCRALFATE 1 GM/10ML PO SUSP
1.0000 g | Freq: Three times a day (TID) | ORAL | Status: DC
  Administered 2022-03-12 – 2022-03-17 (×16): 1 g via ORAL
  Filled 2022-03-12 (×17): qty 10

## 2022-03-12 MED ORDER — LORAZEPAM 2 MG/ML IJ SOLN
1.0000 mg | Freq: Three times a day (TID) | INTRAMUSCULAR | Status: AC
  Administered 2022-03-12 – 2022-03-13 (×3): 1 mg via INTRAVENOUS
  Filled 2022-03-12 (×3): qty 1

## 2022-03-12 MED ORDER — DIPHENHYDRAMINE HCL 50 MG/ML IJ SOLN
25.0000 mg | Freq: Four times a day (QID) | INTRAMUSCULAR | Status: DC | PRN
  Administered 2022-03-12: 25 mg via INTRAMUSCULAR
  Filled 2022-03-12: qty 1

## 2022-03-12 MED ORDER — LORAZEPAM 1 MG PO TABS: 1.0000 mg | ORAL_TABLET | Freq: Three times a day (TID) | ORAL | Status: DC

## 2022-03-12 MED ORDER — LORAZEPAM 1 MG PO TABS
1.0000 mg | ORAL_TABLET | Freq: Three times a day (TID) | ORAL | Status: DC
  Administered 2022-03-13 – 2022-03-17 (×13): 1 mg via ORAL
  Filled 2022-03-12 (×13): qty 1

## 2022-03-12 MED ORDER — DIPHENHYDRAMINE HCL 50 MG/ML IJ SOLN
25.0000 mg | Freq: Four times a day (QID) | INTRAMUSCULAR | Status: AC | PRN
  Administered 2022-03-13 (×2): 25 mg via INTRAVENOUS
  Filled 2022-03-12 (×2): qty 1

## 2022-03-12 MED ORDER — VENLAFAXINE HCL 75 MG PO TABS
75.0000 mg | ORAL_TABLET | Freq: Two times a day (BID) | ORAL | Status: DC
  Administered 2022-03-13 – 2022-03-17 (×10): 75 mg via ORAL
  Filled 2022-03-12 (×10): qty 1

## 2022-03-12 MED ORDER — PROMETHAZINE HCL 25 MG/ML IJ SOLN
12.5000 mg | Freq: Once | INTRAMUSCULAR | Status: AC
  Administered 2022-03-12: 12.5 mg via INTRAMUSCULAR
  Filled 2022-03-12: qty 1

## 2022-03-12 MED ORDER — SORBITOL 70 % SOLN
960.0000 mL | TOPICAL_OIL | Freq: Once | ORAL | Status: AC
  Administered 2022-03-13: 960 mL via RECTAL
  Filled 2022-03-12 (×2): qty 240

## 2022-03-12 NOTE — Assessment & Plan Note (Addendum)
Significant anxiety regarding daughter's upcoming surgery. Psychiatry consulted and recommended continuing Effexor. Patient also started on Ativan. Recommendations for outpatient follow-up.

## 2022-03-12 NOTE — TOC Initial Note (Addendum)
Transition of Care [facility]) - Initial/Assessment Note    Patient Details  Name: Krystal [name] MRN: [id number] Date of Birth: June 19, 1990  Transition of Care [facility]) CM/SW Contact:    Henrietta Dine, RN Phone Number: 03/12/2022, 6:00 PM  Clinical Narrative:                 [facility] consult for d/c needs; spoke w/ pt in room; pt says she is from home and she plans to return at d/c; she identified her POC husband Hadar Elgersma [phone]); pt denies IPV; she says she has food insecurity and her utilities (heat is disconnected); she says they have been using space heaters for heat; pt says she has  back pack beginnings; pt also says she does not have a PCP b/c her previous stopped taking Medicaid; she says she had an appt at [facility] on 03/10/22 but she missed it d/t being in hospital; she is not sure who her Medicaid CM is; pt says she needs new glasses; she does not have DME, Omar services, or home oxygen; pt agrees to having resources for social services and financial assistance; resources placed in d/c instructions; copies or resources also given to pt; she will call social services to reschedule her appt; psych note requests pt be referred to OP psych at d/c; unable to complete referral b/c pt does not have PCP; will pass on to oncoming TOC for follow up; TOC will follow.  Expected Discharge Plan: Home/Self Care Barriers to Discharge: Continued Medical Work up   Patient Goals and CMS Choice Patient states their goals for this hospitalization and ongoing recovery are:: home          Expected Discharge Plan and Services   Discharge Planning Services: CM Consult   Living arrangements for the past 2 months: Single Family Home                  Prior Living Arrangements/Services Living arrangements for the past 2 months: Single Family Home Lives with:: Spouse Patient language and need for interpreter reviewed:: Yes Do you feel safe going back to the place where you live?: Yes       Need for Family Participation in Patient Care: Yes (Comment) Care giver support system in place?: Yes (comment)   Criminal Activity/Legal Involvement Pertinent to Current Situation/Hospitalization: No - Comment as needed  Activities of Daily Living Home Assistive Devices/Equipment: None ADL Screening (condition at time of admission) Patient's cognitive ability adequate to safely complete daily activities?: Yes Is the patient deaf or have difficulty hearing?: No Does the patient have difficulty seeing, even when wearing glasses/contacts?: No Does the patient have difficulty concentrating, remembering, or making decisions?: No Patient able to express need for assistance with ADLs?: Yes Does the patient have difficulty dressing or bathing?: No Independently performs ADLs?: Yes (appropriate for developmental age) Does the patient have difficulty walking or climbing stairs?: No Weakness of Legs: None Weakness of Arms/Hands: Both  Permission Sought/Granted Permission sought to share information with : Case Manager Permission granted to share information with : Yes, Verbal Permission Granted  Share Information with NAME: Lenor Coffin, RN, CM     Permission granted to share info w Relationship: Candita Borenstein (spouse) [phone]     Emotional Assessment Appearance:: Appears stated age Attitude/Demeanor/Rapport: Gracious Affect (typically observed): Accepting Orientation: : Oriented to Self, Oriented to Place, Oriented to  Time, Oriented to Situation Alcohol / Substance Use: Not Applicable Psych Involvement: Yes (comment)  Admission diagnosis:  Blurry vision [  H53.8] Hypertensive urgency [I16.0] 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] Patient Active Problem List   Diagnosis Date Noted   Hypothyroidism 03/11/2022   GERD with esophagitis 03/11/2022   Retinal vein occlusion of right eye 03/11/2022   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 03/05/2022   Moderate persistent  asthma with acute exacerbation 12/04/2021   Cervicalgia 10/31/2021   Borderline personality disorder in adult [facility]) 10/21/2021   Status post repeat low transverse cesarean section 07/21/2021   Intractable nausea and vomiting 07/05/2021   Poor fetal growth affecting management of mother in second trimester    [redacted] weeks gestation of pregnancy    IUGR, antenatal 07/02/2021   Anisocoria 07/02/2021   History of excision of intestinal structure 05/23/2021   Attention deficit disorder 04/17/2021   Chronic fatigue syndrome 04/17/2021   Anosmia 04/17/2021   Supervision of high risk pregnancy, antepartum 04/01/2021   History of preterm delivery, currently pregnant 04/01/2021   Unwanted fertility 04/01/2021   Chronic urticaria 01/15/2021   Local reaction to hymenoptera sting 01/15/2021   Perennial allergic rhinitis 01/15/2021   MDD (major depressive disorder), recurrent episode, severe ([place]) 11/19/2020   Fibromyalgia 05/11/2020   Gastroesophageal reflux disease 05/11/2020   Interstitial cystitis 05/11/2020   PTSD (post-traumatic stress disorder) 05/11/2020   Primary insomnia 05/11/2020   Essential hypertension 05/11/2020   Hypothyroidism due to Hashimoto's thyroiditis 10/03/2019   Anxiety 06/16/2019   History of uterine scar from previous surgery 06/07/2019   Obesity 06/07/2019   History of HELLP syndrome, currently pregnant 04/21/2019   Lumbar spondylosis 06/23/2018   Sacroiliac inflammation ([place]) 06/23/2018   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   History of upper limb amputation, wrist, 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   Chronic hypertension in pregnancy 10/08/2017   PCP:  Patient, No Pcp Per Pharmacy:   [facility] [place], [address] [place] [address] Phone: [phone] Fax: [phone]     Social Determinants of Health (SDOH) Social History: SDOH Screenings   Food Insecurity: No Food  Insecurity (03/07/2022)  Transportation Needs: Unmet Transportation Needs (06/13/2021)  Depression (PHQ2-9): High Risk (11/11/2021)  Tobacco Use: Low Risk  (03/05/2022)   SDOH Interventions:     Readmission Risk Interventions     No data to display

## 2022-03-12 NOTE — Progress Notes (Signed)
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  Upon evaluation in the emergency department blood pressures continue to be as high as 229/155 and due to patient's neurologic symptoms visual changes EDP contacted PCCM for admission for Cardene infusion to the intensive care unit.  MRI performed in the emergency department was unremarkable.  Patient was additionally found to have a urinalysis suggestive of urinary tract infection consistent with cystitis.  Patient was treated with a course of 3 days of ceftriaxone.  Concerning patient's vision loss Dr. 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  Ophthalmology recommended outpatient follow-up with [facility] for possible injection 1 to 2 weeks after discharge.  For assistance with management of patient's mental health diagnoses, Dr. Leverne Humbles psychiatry was consulted with assistance and adjustment of her psychotropic medications.  In the days that followed admission blood pressure gradually decreased with combination of Cardene and resumption of home antihypertensives.   Headaches and visual changes that the patient had presented with had  resolved.   Assessment and Plan: * 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 -Continue amlodipine, propranolol, losartan, spironolactone  Intractable nausea and vomiting Persistent. Possibly contributed to by severe anxiety. History of THC use which could also be contributing. No evidence of bowel obstruction. Psychiatry consulted for concern of behavioral health component. GI consulted today with concern for possible gastroparesis vs multifactorial cause. GI recommended gastric emptying study, however patient cannot tolerate at this time. -GI recommendations: Protonix, sucralfate  PTSD (post-traumatic stress disorder) See problem, Anxiety  Retinal vein occlusion of right eye Patient evaluated by ophthalmology, Dr. Manuella Ghazi with recommendation for follow-up in 1-2 weeks post-discharge  Interstitial cystitis Chronic issue. Likely complicating current admission. Patient declined oxybutynin.  Hypothyroidism -Continue Synthroid  GERD with esophagitis -Continue Protonix  Anxiety Psychiatry recommendations: Effexor, Ativan scheduled     DVT prophylaxis: Heparin subq Code Status:   Code Status: Full Code Family Communication: None Disposition Plan: Discharge pending ability to tolerate oral intake   Consultants:  Psychiat[facility]  Procedures:  None  Antimicrobials: Ceftriaxone Keflex    Subjective: Patient reports continued intractable nausea/vomiting with associated abdominal pain.  Objective: BP (!) 146/103 (BP Location: Right Arm)   Pulse 79   Temp 98 F (36.7 C)   Resp 14   Ht '5\' 11"'$  (1.803 m)   Wt 108.9 kg   LMP 02/05/2022   SpO2 95%   BMI 33.48 kg/m   Examination:  General exam: Appears calm and comfortable Respiratory system: Clear to auscultation. Respiratory effort normal. Cardiovascular system: S1 & S2 heard, RRR. No murmurs. Gastrointestinal system: Abdomen is nondistended, soft and tender. Normal bowel sounds  heard. Central nervous system: Alert and oriented. No focal neurological deficits. Musculoskeletal: No edema. No calf tenderness. Left forearm amputation. Skin: No cyanosis. No rashes Psychiatry: Judgement and insight appear normal. Mood & affect  appropriate.    Data Reviewed: I have personally reviewed following labs and imaging studies  CBC Lab Results  Component Value Date   WBC 8.1 03/12/2022   RBC 4.45 03/12/2022   HGB 14.6 03/12/2022   HCT 43.3 03/12/2022   MCV 97.3 03/12/2022   MCH 32.8 03/12/2022   PLT 275 03/12/2022   MCHC 33.7 03/12/2022   RDW 12.0 03/12/2022   LYMPHSABS 1.8 03/12/2022   MONOABS 1.0 03/12/2022   EOSABS 0.0 03/12/2022   BASOSABS 0.0 17/49/4496     Last metabolic panel Lab Results  Component Value Date   NA 134 (L) 03/12/2022   K 4.4 03/12/2022   CL 97 (L) 03/12/2022   CO2 29 03/12/2022   BUN 16 03/12/2022   CREATININE 0.83 03/12/2022   GLUCOSE 130 (H) 03/12/2022   GFRNONAA >60 03/12/2022   GFRAA >60 12/18/2017   CALCIUM 9.3 03/12/2022   PHOS 2.9 03/08/2022   PROT 6.8 03/12/2022   ALBUMIN 3.8 03/12/2022   LABGLOB 2.1 08/14/2021   AGRATIO 1.9 08/14/2021   BILITOT 0.5 03/12/2022   ALKPHOS 49 03/12/2022   AST 28 03/12/2022   ALT 28 03/12/2022   ANIONGAP 8 03/12/2022    GFR: Estimated Creatinine Clearance: 130.9 mL/min (by C-G formula based on SCr of 0.83 mg/dL).  Recent Results (from the past 240 hour(s))  Resp panel by RT-PCR (RSV, Flu A&B, Covid) Anterior Nasal Swab     Status: None   Collection Time: 03/05/22 11:51 AM   Specimen: Anterior Nasal Swab  Result Value Ref Range Status   SARS Coronavirus 2 by RT PCR NEGATIVE NEGATIVE Final    Comment: (NOTE) SARS-CoV-2 target nucleic acids are NOT DETECTED.  The SARS-CoV-2 RNA is generally detectable in upper respiratory specimens during the acute phase of infection. The lowest concentration of SARS-CoV-2 viral copies this assay can detect is 138 copies/mL. A negative result does  not preclude SARS-Cov-2 infection and should not be used as the sole basis for treatment or other patient management decisions. A negative result may occur with  improper specimen collection/handling, submission of specimen other than nasopharyngeal swab, presence of viral mutation(s) within the areas targeted by this assay, and inadequate number of viral copies(<138 copies/mL). A negative result must be combined with clinical observations, patient history, and epidemiological information. The expected result is Negative.  Fact Sheet for Patients:  EntrepreneurPulse.com.au  Fact Sheet for Healthcare Providers:  IncredibleEmployment.be  This test is no t yet approved or cleared by the [place] FDA and  has been authorized for detection and/or diagnosis of SARS-CoV-2 by FDA under an Emergency Use Authorization (EUA). This EUA will remain  in effect (meaning this test can be used) for the duration of the COVID-19 declaration under Section 564(b)(1) of the Act, 21 U.S.C.section 360bbb-3(b)(1), unless the authorization is terminated  or revoked sooner.       Influenza A by PCR NEGATIVE NEGATIVE Final   Influenza B by PCR NEGATIVE NEGATIVE Final    Comment: (NOTE) The Xpert Xpress SARS-CoV-2/FLU/RSV plus assay is intended as an aid in the diagnosis of influenza from Nasopharyngeal swab specimens and should not be used as a sole basis for treatment. Nasal washings and aspirates are unacceptable for Xpert Xpress SARS-CoV-2/FLU/RSV testing.  Fact Sheet for Patients: EntrepreneurPulse.com.au  Fact Sheet for Healthcare Providers: IncredibleEmployment.be  This test is not yet approved or cleared by the [place] FDA and has been authorized for detection and/or diagnosis of SARS-CoV-2 by FDA under an Emergency Use Authorization (  EUA). This EUA will remain in effect (meaning this test can be used) for the  duration of the COVID-19 declaration under Section 564(b)(1) of the Act, 21 U.S.C. section 360bbb-3(b)(1), unless the authorization is terminated or revoked.     Resp Syncytial Virus by PCR NEGATIVE NEGATIVE Final    Comment: (NOTE) Fact Sheet for Patients: EntrepreneurPulse.com.au  Fact Sheet for Healthcare Providers: IncredibleEmployment.be  This test is not yet approved or cleared by the [place] FDA and has been authorized for detection and/or diagnosis of SARS-CoV-2 by FDA under an Emergency Use Authorization (EUA). This EUA will remain in effect (meaning this test can be used) for the duration of the COVID-19 declaration under Section 564(b)(1) of the Act, 21 U.S.C. section 360bbb-3(b)(1), unless the authorization is terminated or revoked.  Performed at [facility], [place] [address], [place], [place] [address]   MRSA Next Gen by PCR, Nasal     Status: None   Collection Time: 03/05/22  6:07 PM   Specimen: Nasal Mucosa; Nasal Swab  Result Value Ref Range Status   MRSA by PCR Next Gen NOT DETECTED NOT DETECTED Final    Comment: (NOTE) The GeneXpert MRSA Assay (FDA approved for NASAL specimens only), is one component of a comprehensive MRSA colonization surveillance program. It is not intended to diagnose MRSA infection nor to guide or monitor treatment for MRSA infections. Test performance is not FDA approved in patients less than 75 years old. Performed at [facility], [place] [address], [place], [place] [address]       Radiology Studies: DG Abd 1 View  Result Date: 03/11/2022 CLINICAL DATA:  Bowel obstruction EXAM: ABDOMEN - 1 VIEW COMPARISON:  Radiograph 03/06/2022 FINDINGS: Nonobstructive bowel gas pattern. Mild-to-moderate stool burden. No radiopaque calculi overlie the kidneys. No acute osseous abnormality. Right upper quadrant surgical clips consistent with prior cholecystectomy.  IMPRESSION: No evidence of bowel obstruction.  Mild-to-moderate stool burden. Electronically Signed   By: Maurine Simmering M.D.   On: 03/11/2022 14:43      LOS: 7 days    Cordelia Poche, MD [facility] 03/12/2022, 4:29 PM   If 7PM-7AM, please contact night-coverage [email or web address]

## 2022-03-12 NOTE — Consult Note (Addendum)
Referring Provider: [facility] Primary Care Physician:  Patient, No Pcp Per Primary Gastroenterologist: Unassigned  Reason for Consultation: Nausea and vomiting  HPI: Analyah Mcconnon [name] is a 32 y.o. female with past medical history of PTSD, anxiety, depression, history of help syndrome and history of uncontrolled hypertension admitted to the hospital on January 10 with elevated blood pressure and vision change.  Was diagnosed with hypertensive urgency.  She has been having intractable nausea and vomiting.  GI is consulted for further evaluation.    Patient with history of intermittent nausea and vomiting looks like dated back to few years.  Her EGD in March 2022 for evaluation of epigastric pain and nausea which showed gastritis.  Biopsies were negative for H. pylori.  Sequently underwent cholecystectomy in March 2022.  History of marijuana use.  Last use 3 days prior to admission.  He started noticing worsening nausea and vomiting after her cholecystectomy around 6 months ago.  Complaining of suprapubic abdominal pain which radiates towards groin.  Also complaining of intermittent constipation.  Denies any blood in the stool or black stool.  Nausea and vomiting is not related to food intake but food intake makes her nausea worse.  Usually throws up undigested food even 6 hours after eating.  CT  abdomen pelvis with IV contrast on March 02, 2022 showed no acute changes and prominent stool burden in the cecum.  Normal MRI brain earlier this month.  Blood work this morning showed normal CBC and CMP.  Normal lipase.  Negative urine pregnancy test.   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   PTSD (post-traumatic stress disorder)    Severe uncontrolled hypertension 10/08/2017   UTI (urinary tract infection)     Past Surgical History:  Procedure Laterality Date   APPENDECTOMY     BIOPSY  05/10/2020   Procedure: BIOPSY;  Surgeon: Arta Silence, MD;  Location: [facility];  Service: Endoscopy;;   CESAREAN SECTION N/A 12/06/2017   Procedure: CESAREAN SECTION;  Surgeon: Sloan Leiter, MD;  Location: [facility];  Service: Obstetrics;  Laterality: N/A;   CESAREAN SECTION  07/21/2021   Procedure: CESAREAN SECTION;  Surgeon: Donnamae Jude, MD;  Location: [facility];  Service: Obstetrics;;   CHOLECYSTECTOMY N/A 05/11/2020   Procedure: SINGLE SITE LAPAROSCOPIC CHOLECYSTECTOMY AND LIVER BIOSY;  Surgeon: Michael Boston, MD;  Location: [facility];  Service: General;  Laterality: N/A;   ESOPHAGOGASTRODUODENOSCOPY (EGD) WITH PROPOFOL N/A 05/10/2020   Procedure: ESOPHAGOGASTRODUODENOSCOPY (EGD) WITH PROPOFOL;  Surgeon: Arta Silence, MD;  Location: [facility];  Service: Endoscopy;  Laterality: N/A;   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 Authorizing Provider  acetaminophen (TYLENOL) 500 MG tablet Take 2 tablets (1,000 mg total) by mouth every 6 (six) hours as needed. Patient taking differently: Take 1,000 mg by mouth every 6 (six) hours as needed for mild pain or  headache. 05/14/20  Yes Bonnielee Haff, MD  albuterol (VENTOLIN HFA) 108 (90 Base) MCG/ACT inhaler Inhale 2 puffs into the lungs every 4 (four) hours as needed for wheezing or shortness of breath (coughing fits). 12/04/21  Yes Garnet Sierras, DO  amLODipine (NORVASC) 10 MG tablet Take 1 tablet (10 mg total) by mouth daily. 05/31/21  Yes Freada Bergeron, MD  cephALEXin  (KEFLEX) 250 MG capsule Take 1 capsule (250 mg total) by mouth 4 (four) times daily. 03/02/22  Yes Rex Kras, PA  cetirizine (ZYRTEC ALLERGY) 10 MG tablet Take 1 tablet (10 mg total) by mouth 2 (two) times daily. 03/26/21  Yes Garnet Sierras, DO  clonazePAM (KLONOPIN) 1 MG tablet Take 1 tablet (1 mg total) by mouth daily as needed for anxiety. 02/28/22 02/28/23 Yes Vista Mink, MD  diazepam (VALIUM) 5 MG tablet Take 5 mg by mouth every 6 (six) hours as needed (for bladder spasms).   Yes [provider]  EPINEPHrine 0.3 mg/0.3 mL IJ SOAJ injection Inject 0.3 mg into the muscle as needed for anaphylaxis. 01/15/21  Yes Garnet Sierras, DO  famotidine (PEPCID) 20 MG tablet Take 1 tablet (20 mg total) by mouth 2 (two) times daily. 12/04/21  Yes Garnet Sierras, DO  hydrALAZINE (APRESOLINE) 100 MG tablet Take 1 tablet (100 mg total) by mouth 3 (three) times daily. 05/14/20  Yes Bonnielee Haff, MD  ibuprofen (ADVIL) 600 MG tablet Take 1 tablet (600 mg total) by mouth every 6 (six) hours as needed for fever, headache or moderate pain. 08/14/21  Yes Genia Del, MD  levothyroxine (SYNTHROID) 125 MCG tablet Take 1 tablet (125 mcg total) by mouth daily before breakfast. 05/24/21  Yes Philemon Kingdom, MD  lurasidone (LATUDA) 20 MG TABS tablet Take 1 tablet (20 mg total) by mouth daily. Take with food (dinner) 01/07/22  Yes Vista Mink, MD  ondansetron (ZOFRAN-ODT) 4 MG disintegrating tablet Take 1 tablet (4 mg total) by mouth every 8 (eight) hours as needed for nausea or vomiting. 03/02/22  Yes Rex Kras, PA  oxyCODONE-acetaminophen (PERCOCET/ROXICET) 5-325 MG tablet Take 1 tablet by mouth every 6 (six) hours as needed for severe pain. 03/02/22  Yes Rex Kras, PA  Prenatal Multivit-Min-Fe-FA (PRE-NATAL PO) Take 1 tablet by mouth daily.   Yes [provider]  propranolol (INDERAL) 20 MG tablet Take 1 tablet (20 mg total) by mouth 2 (two) times daily as needed (for anxiety). 01/07/22  Yes Vista Mink, MD   spironolactone (ALDACTONE) 25 MG tablet Take 1 tablet (25 mg total) by mouth daily. 09/19/21  Yes Freada Bergeron, MD  SYMBICORT 80-4.5 MCG/ACT inhaler Inhale 2 puffs into the lungs 2 (two) times daily.   Yes [provider]  venlafaxine XR (EFFEXOR-XR) 75 MG 24 hr capsule Take 3 capsules (225 mg total) by mouth daily. 01/07/22  Yes Vista Mink, MD  naproxen (NAPROSYN) 500 MG tablet Take 1 tablet (500 mg total) by mouth 2 (two) times daily. Patient not taking: Reported on 03/06/2022 03/02/22   Rex Kras, PA    Scheduled Meds:  amLODipine  10 mg Oral Daily   capsicum   Topical BID   clonazePAM  1 mg Oral TID   [START ON 03/13/2022] cloNIDine  0.3 mg Transdermal Weekly   feeding supplement  237 mL Oral TID BM   gabapentin  300 mg Oral BID   heparin  5,000 Units Subcutaneous Q8H   levothyroxine  125 mcg Oral Q0600  losartan  100 mg Oral Daily   lurasidone  20 mg Oral QPC supper   pantoprazole (PROTONIX) IV  40 mg Intravenous Q12H   propranolol  20 mg Oral BID   scopolamine  1 patch Transdermal Q72H   spironolactone  25 mg Oral Daily   venlafaxine XR  150 mg Oral Daily   Continuous Infusions:  sodium chloride Stopped (03/11/22 0501)   promethazine (PHENERGAN) injection (IM or IVPB) Stopped (03/11/22 1712)   PRN Meds:.sodium chloride, albuterol, alum & mag hydroxide-simeth, bisacodyl, bisacodyl, DIPHENHYDRAMINE HCL '25MG'$  CAPSULE (DYE FREE), hydrALAZINE, HYDROmorphone (DILAUDID) injection, ketorolac, melatonin, ondansetron, mouth rinse, oxybutynin, oxyCODONE, polyethylene glycol, promethazine (PHENERGAN) injection (IM or IVPB)  Allergies as of 03/05/2022 - Review Complete 03/05/2022  Allergen Reaction Noted   Other Shortness Of Breath and Other (See Comments) 11/06/2015   Red dye Other (See Comments) 11/06/2015   Compazine [prochlorperazine maleate] Other (See Comments) 11/06/2015   Vicodin [hydrocodone-acetaminophen] Itching, Nausea And Vomiting, and Other (See Comments)  11/06/2015   Amitriptyline hcl Other (See Comments) 05/08/2020   Duloxetine hcl Other (See Comments) 05/08/2020   Lisinopril Cough 08/10/2021   Reglan [metoclopramide] Other (See Comments) 07/24/2017   Nifedipine Nausea And Vomiting and Other (See Comments) 09/07/2018    Family History  Problem Relation Age of Onset   Hypertension Mother    Hypertension Father     Social History   Socioeconomic History   Marital status: Married    Spouse name: Not on file   Number of children: 3   Years of education: Not on file   Highest education level: 8th grade  Occupational History   Not on file  Tobacco Use   Smoking status: Never   Smokeless tobacco: Never   Tobacco comments:    She tried a cigarette and never smoked again  Vaping Use   Vaping Use: Never used  Substance and Sexual Activity   Alcohol use: Yes    Comment: bottle of wine a week-occ   Drug use: No   Sexual activity: Not Currently  Other Topics Concern   Not on file  Social History Narrative   Not on file   Social Determinants of Health   Financial Resource Strain: Not on file  Food Insecurity: No Food Insecurity (03/07/2022)   Hunger Vital Sign    Worried About Running Out of Food in the Last Year: Never true    Ran Out of Food in the Last Year: Never true  Transportation Needs: Unmet Transportation Needs (06/13/2021)   PRAPARE - Hydrologist (Medical): Yes    Lack of Transportation (Non-Medical): Yes  Physical Activity: Not on file  Stress: Not on file  Social Connections: Not on file  Intimate Partner Violence: Not on file    Review of Systems: All negative except as stated above in HPI.  Physical Exam: Vital signs: Vitals:   03/11/22 2132 03/12/22 0535  BP: 135/85 (!) 142/75  Pulse: 84 79  Resp: 18 18  Temp: 98.2 F (36.8 C) 98.6 F (37 C)  SpO2: 96% 93%   Last BM Date : 03/10/22 General:   Alert,  Well-developed, well-nourished, pleasant and cooperative in  NAD Lungs: No visible respiratory distress Heart:  Regular rate and rhythm; no murmurs, clicks, rubs,  or gallops. Abdomen: Scar marks noted, suprapubic tenderness to palpation, abdomen is otherwise soft, bowel sounds present, no peritoneal signs She is somewhat depressed and anxious. Alert and oriented x 3 Rectal:  Deferred  GI:  Lab Results: Recent Labs    03/11/22 0309 03/12/22 0456  WBC 7.9 8.1  HGB 14.5 14.6  HCT 44.0 43.3  PLT 296 275   BMET Recent Labs    03/11/22 0309 03/12/22 0456  NA 138 134*  K 3.9 4.4  CL 100 97*  CO2 28 29  GLUCOSE 107* 130*  BUN 12 16  CREATININE 0.94 0.83  CALCIUM 9.4 9.3   LFT Recent Labs    03/12/22 0456  PROT 6.8  ALBUMIN 3.8  AST 28  ALT 28  ALKPHOS 49  BILITOT 0.5   PT/INR No results for input(s): "LABPROT", "INR" in the last 72 hours.   Studies/Results: DG Abd 1 View  Result Date: 03/11/2022 CLINICAL DATA:  Bowel obstruction EXAM: ABDOMEN - 1 VIEW COMPARISON:  Radiograph 03/06/2022 FINDINGS: Nonobstructive bowel gas pattern. Mild-to-moderate stool burden. No radiopaque calculi overlie the kidneys. No acute osseous abnormality. Right upper quadrant surgical clips consistent with prior cholecystectomy. IMPRESSION: No evidence of bowel obstruction.  Mild-to-moderate stool burden. Electronically Signed   By: Maurine Simmering M.D.   On: 03/11/2022 14:43    Impression/Plan: -Chronic nausea and vomiting with worsening symptoms 6 months ago.  She gets vomiting even when she is fasting.  Symptoms worse after food intake and usually throws up undigested food even 6 hours after eating.  Need to rule out gastroparesis.  Nausea and vomiting likely multifactorial. -History of marijuana use.  Last use 3 days prior to admission -Anxiety and depression -Possible chronic pain syndrome -Constipation  Recommendations ------------------------- -Case discussed with hospitalist.  Check urine drug screen. -Get gastric emptying scan -She is  allergic to Reglan.  Consider trial of IV erythromycin if gastric emptying scan is positive. -Continue PPI.  Trial of sucralfate  -Continue MiraLAX -GI will follow    LOS: 7 days   Otis Brace  MD, FACP 03/12/2022, 12:30 PM  Contact #  [phone]

## 2022-03-12 NOTE — Progress Notes (Signed)
Chaplain engaged in a follow-up visit with Krystal [name].  Krystal [name] was tearful as she expressed that she has still been in a lot of pain and still very nauseous.  Chaplain and Krystal [name] worked to highlight the things that have gotten better within her hospital stay, such as moving off of the ICU, her blood pressure being more regulated, and acknowledging how the team is working to figure out the medicine that works best for her.  Krystal [name] was able to see some positivity in those things.    She also talked about her daughters who range from 60 months - 67 years old.  She stated that she is working to not disassociate or become overly anxious concerning her children.  Chaplain worked to offer perspective that she is getting closer to being able to be with them.    Chaplain also asked her about her medications being regulated from Psych, and she noted that she feels like she disassociates more with Haldol and Compazine.  She also mentioned the use of Reglan as creating some changes within her.    Chaplain continues to offer reflective listening, a supportive presence, and support.  Krystal [name] was given medicine and had to have her IV fixed at time of visit.  Chaplain will follow-up later today for one-on-one visit.   Krystal [name], MDiv   03/12/22 1034  Spiritual Encounters  Type of Visit Follow up  Care provided to: Patient  Reason for visit Routine spiritual support  Spiritual Framework  Patient Stress Factors Exhausted;Loss of control;Health changes
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
## 2022-03-13 ENCOUNTER — Inpatient Hospital Stay: Payer: Self-pay

## 2022-03-13 ENCOUNTER — Telehealth (HOSPITAL_COMMUNITY): Payer: Medicaid Other | Admitting: Psychiatry

## 2022-03-13 DIAGNOSIS — I169 Hypertensive crisis, unspecified: Secondary | ICD-10-CM

## 2022-03-13 DIAGNOSIS — F419 Anxiety disorder, unspecified: Secondary | ICD-10-CM

## 2022-03-13 DIAGNOSIS — R112 Nausea with vomiting, unspecified: Secondary | ICD-10-CM

## 2022-03-13 DIAGNOSIS — E039 Hypothyroidism, unspecified: Secondary | ICD-10-CM

## 2022-03-13 DIAGNOSIS — F4310 Post-traumatic stress disorder, unspecified: Secondary | ICD-10-CM

## 2022-03-13 DIAGNOSIS — N3010 Interstitial cystitis (chronic) without hematuria: Secondary | ICD-10-CM

## 2022-03-13 MED ORDER — POLYETHYLENE GLYCOL 3350 17 G PO PACK
17.0000 g | PACK | Freq: Two times a day (BID) | ORAL | Status: DC
  Administered 2022-03-13 – 2022-03-17 (×8): 17 g via ORAL
  Filled 2022-03-13 (×8): qty 1

## 2022-03-13 MED ORDER — SODIUM CHLORIDE 0.9 % IV SOLN
250.0000 mg | Freq: Four times a day (QID) | INTRAVENOUS | Status: DC
  Administered 2022-03-13 – 2022-03-17 (×16): 250 mg via INTRAVENOUS
  Filled 2022-03-13 (×24): qty 5

## 2022-03-13 MED ORDER — SODIUM CHLORIDE 0.9% FLUSH: 10.0000 mL | INTRAVENOUS | Status: DC | PRN

## 2022-03-13 MED ORDER — CHLORHEXIDINE GLUCONATE CLOTH 2 % EX PADS
6.0000 | MEDICATED_PAD | Freq: Every day | CUTANEOUS | Status: DC
  Administered 2022-03-13 – 2022-03-17 (×7): 6 via TOPICAL

## 2022-03-13 MED FILL — Diphenhydramine HCl Cap 25 MG: ORAL | Qty: 1 | Status: AC

## 2022-03-13 NOTE — Progress Notes (Signed)
PROGRESS NOTE    Krystal [name]  DDU:[id number] DOB: 01-15-1991 DOA: 03/05/2022 PCP: Patient, No Pcp Per   Brief Narrative: Krystal [name] is a 32 y.o. 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 Patient presented secondary to right eye vision loss with associated severely elevated hypertension consistent with hypertensive emergency. Patient was admitted to the ICU for Cardene IV infusion to better control hypertension. Hospitalization has been complicated by intractable nausea/vomiting.   Assessment and Plan: * 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 -Continue amlodipine, propranolol, losartan, spironolactone  Intractable nausea and vomiting Persistent. Possibly contributed to by severe anxiety. History of THC use which could also be contributing. No evidence of bowel obstruction. Psychiatry consulted for concern of behavioral health component. GI consulted today with concern for possible gastroparesis vs multifactorial cause. GI recommended gastric emptying study, however patient cannot tolerate at this time. -GI recommendations: Protonix, sucralfate  PTSD (post-traumatic stress disorder) See problem, Anxiety  Retinal vein occlusion of right eye Patient evaluated by ophthalmology, Dr. Manuella Ghazi with recommendation for follow-up in 1-2 weeks post-discharge  Interstitial cystitis Chronic issue. Likely complicating current admission. Patient declined oxybutynin.  Hypothyroidism -Continue Synthroid  GERD with esophagitis -Continue Protonix  Anxiety Psychiatry recommendations: Effexor, Ativan scheduled    DVT prophylaxis: Heparin subq Code Status:   Code Status: Full Code Family Communication: None Disposition Plan: Discharge pending ability to tolerate oral intake   Consultants:   Psychiat[facility]  Procedures:  None  Antimicrobials: Ceftriaxone Keflex    Subjective: Patient reports continued nausea/vomiting but symptoms have improved slightly since yesterday.  Objective: BP 130/64 (BP Location: Right Arm)   Pulse 67   Temp 98.4 F (36.9 C) (Oral)   Resp 18   Ht '5\' 11"'$  (1.803 m)   Wt 108.9 kg   LMP 02/05/2022   SpO2 97%   BMI 33.48 kg/m   Examination:  General exam: Appears calm and comfortable Respiratory system: Clear to auscultation. Respiratory effort normal. Cardiovascular system: S1 & S2 heard, RRR. No murmurs, rubs, gallops or clicks. Gastrointestinal system: Abdomen is nondistended, soft and nontender. Normal bowel sounds heard. Central nervous system: Alert and oriented. No focal neurological deficits. Skin: Multiple bruises noted on abdomen   Data Reviewed: I have personally reviewed following labs and imaging studies  CBC Lab Results  Component Value Date   WBC 8.1 03/12/2022   RBC 4.45 03/12/2022   HGB 14.6 03/12/2022   HCT 43.3 03/12/2022   MCV 97.3 03/12/2022   MCH 32.8 03/12/2022   PLT 275 03/12/2022   MCHC 33.7 03/12/2022   RDW 12.0 03/12/2022   LYMPHSABS 1.8 03/12/2022   MONOABS 1.0 03/12/2022   EOSABS 0.0 03/12/2022   BASOSABS 0.0 23/76/2831     Last metabolic panel Lab Results  Component Value Date   NA 134 (L) 03/12/2022   K 4.4 03/12/2022   CL 97 (L) 03/12/2022   CO2 29 03/12/2022   BUN 16 03/12/2022   CREATININE 0.83 03/12/2022   GLUCOSE 130 (H) 03/12/2022   GFRNONAA >60 03/12/2022   GFRAA >60 12/18/2017   CALCIUM 9.3 03/12/2022   PHOS 2.9 03/08/2022   PROT 6.8 03/12/2022   ALBUMIN 3.8 03/12/2022   LABGLOB 2.1 08/14/2021   AGRATIO 1.9 08/14/2021   BILITOT 0.5 03/12/2022   ALKPHOS 49 03/12/2022   AST 28 03/12/2022   ALT 28 03/12/2022   ANIONGAP 8 03/12/2022  GFR: Estimated Creatinine Clearance: 130.9 mL/min (by C-G formula based on SCr of 0.83 mg/dL).  Recent Results  (from the past 240 hour(s))  Resp panel by RT-PCR (RSV, Flu A&B, Covid) Anterior Nasal Swab     Status: None   Collection Time: 03/05/22 11:51 AM   Specimen: Anterior Nasal Swab  Result Value Ref Range Status   SARS Coronavirus 2 by RT PCR NEGATIVE NEGATIVE Final    Comment: (NOTE) SARS-CoV-2 target nucleic acids are NOT DETECTED.  The SARS-CoV-2 RNA is generally detectable in upper respiratory specimens during the acute phase of infection. The lowest concentration of SARS-CoV-2 viral copies this assay can detect is 138 copies/mL. A negative result does not preclude SARS-Cov-2 infection and should not be used as the sole basis for treatment or other patient management decisions. A negative result may occur with  improper specimen collection/handling, submission of specimen other than nasopharyngeal swab, presence of viral mutation(s) within the areas targeted by this assay, and inadequate number of viral copies(<138 copies/mL). A negative result must be combined with clinical observations, patient history, and epidemiological information. The expected result is Negative.  Fact Sheet for Patients:  EntrepreneurPulse.com.au  Fact Sheet for Healthcare Providers:  IncredibleEmployment.be  This test is no t yet approved or cleared by the [place] FDA and  has been authorized for detection and/or diagnosis of SARS-CoV-2 by FDA under an Emergency Use Authorization (EUA). This EUA will remain  in effect (meaning this test can be used) for the duration of the COVID-19 declaration under Section 564(b)(1) of the Act, 21 U.S.C.section 360bbb-3(b)(1), unless the authorization is terminated  or revoked sooner.       Influenza A by PCR NEGATIVE NEGATIVE Final   Influenza B by PCR NEGATIVE NEGATIVE Final    Comment: (NOTE) The Xpert Xpress SARS-CoV-2/FLU/RSV plus assay is intended as an aid in the diagnosis of influenza from Nasopharyngeal swab  specimens and should not be used as a sole basis for treatment. Nasal washings and aspirates are unacceptable for Xpert Xpress SARS-CoV-2/FLU/RSV testing.  Fact Sheet for Patients: EntrepreneurPulse.com.au  Fact Sheet for Healthcare Providers: IncredibleEmployment.be  This test is not yet approved or cleared by the [place] FDA and has been authorized for detection and/or diagnosis of SARS-CoV-2 by FDA under an Emergency Use Authorization (EUA). This EUA will remain in effect (meaning this test can be used) for the duration of the COVID-19 declaration under Section 564(b)(1) of the Act, 21 U.S.C. section 360bbb-3(b)(1), unless the authorization is terminated or revoked.     Resp Syncytial Virus by PCR NEGATIVE NEGATIVE Final    Comment: (NOTE) Fact Sheet for Patients: EntrepreneurPulse.com.au  Fact Sheet for Healthcare Providers: IncredibleEmployment.be  This test is not yet approved or cleared by the [place] FDA and has been authorized for detection and/or diagnosis of SARS-CoV-2 by FDA under an Emergency Use Authorization (EUA). This EUA will remain in effect (meaning this test can be used) for the duration of the COVID-19 declaration under Section 564(b)(1) of the Act, 21 U.S.C. section 360bbb-3(b)(1), unless the authorization is terminated or revoked.  Performed at [facility], [place] [address] [place], [place] [address]   MRSA Next Gen by PCR, Nasal     Status: None   Collection Time: 03/05/22  6:07 PM   Specimen: Nasal Mucosa; Nasal Swab  Result Value Ref Range Status   MRSA by PCR Next Gen NOT DETECTED NOT DETECTED Final    Comment: (NOTE) The GeneXpert MRSA Assay (FDA approved  for NASAL specimens only), is one component of a comprehensive MRSA colonization surveillance program. It is not intended to diagnose MRSA infection nor to guide or monitor treatment for  MRSA infections. Test performance is not FDA approved in patients less than 66 years old. Performed at [facility], [place] [address] [place], [place] [address]       Radiology Studies: DG Abd 1 View  Result Date: 03/11/2022 CLINICAL DATA:  Bowel obstruction EXAM: ABDOMEN - 1 VIEW COMPARISON:  Radiograph 03/06/2022 FINDINGS: Nonobstructive bowel gas pattern. Mild-to-moderate stool burden. No radiopaque calculi overlie the kidneys. No acute osseous abnormality. Right upper quadrant surgical clips consistent with prior cholecystectomy. IMPRESSION: No evidence of bowel obstruction.  Mild-to-moderate stool burden. Electronically Signed   By: Maurine Simmering M.D.   On: 03/11/2022 14:43      LOS: 8 days    Cordelia Poche, MD [facility] 03/13/2022, 11:55 AM   If 7PM-7AM, please contact night-coverage [email or web address]

## 2022-03-13 NOTE — Progress Notes (Signed)
Chaplain engaged in meaningful follow-up visit.  Themes included her support system and community.  Chaplain believes [place] would benefit from therapy/psych resources for herself and her family.  Chaplain will follow-up with Social Work or work to provide resources for care.    03/13/22 1509  Spiritual Encounters  Type of Visit Follow up  Care provided to: Patient  Reason for visit Trauma  Interventions  Spiritual Care Interventions Made Compassionate presence;Reflective listening;Prayer;Explored values/beliefs/practices/strengths

## 2022-03-13 NOTE — Progress Notes (Signed)
[facility] Progress Note  Krystal [name] 32 y.o. 1990/10/02  CC: Nausea and vomiting   Subjective: Patient seen and examined at bedside.  Continues to have nausea and vomiting.  Had 3 episodes of vomiting today.  Not able to get a gastric emptying scan because of ongoing symptoms and use of narcotics.  ROS : afebrile, positive for anxiety.   Objective: Vital signs in last 24 hours: Vitals:   03/13/22 0653 03/13/22 1412  BP: 130/64 134/82  Pulse: 67 75  Resp: 18 18  Temp: 98.4 F (36.9 C) 97.6 F (36.4 C)  SpO2: 97% 96%    Physical Exam:  General:  Alert, cooperative, no distress, appears stated age  Head:  Normocephalic, without obvious abnormality, atraumatic  Eyes:  , EOM's intact,   Lungs:   Clear to auscultation bilaterally, respirations unlabored  Heart:  Regular rate and rhythm, S1, S2 normal  Abdomen:   Soft, non-tender, bowel sounds active all four quadrants,  no masses,   Extremities: Extremities normal, atraumatic, no  edema  Pulses: 2+ and symmetric    Lab Results: Recent Labs    03/11/22 0309 03/12/22 0456  NA 138 134*  K 3.9 4.4  CL 100 97*  CO2 28 29  GLUCOSE 107* 130*  BUN 12 16  CREATININE 0.94 0.83  CALCIUM 9.4 9.3  MG  --  2.2   Recent Labs    03/12/22 0456  AST 28  ALT 28  ALKPHOS 49  BILITOT 0.5  PROT 6.8  ALBUMIN 3.8   Recent Labs    03/11/22 0309 03/12/22 0456  WBC 7.9 8.1  NEUTROABS  --  5.3  HGB 14.5 14.6  HCT 44.0 43.3  MCV 99.1 97.3  PLT 296 275   No results for input(s): "LABPROT", "INR" in the last 72 hours.    Assessment/Plan: -Chronic nausea and vomiting with worsening symptoms 6 months ago.  She gets vomiting even when she is fasting.  Symptoms worse after food intake and usually throws up undigested food even 6 hours after eating.  Need to rule out gastroparesis.  Nausea and vomiting likely multifactorial. -History of marijuana use.  Last use 3 days prior to admission -Anxiety and  depression -Possible chronic pain syndrome -Constipation   Recommendations ------------------------- -Not able to get gastric emptying scan because of narcotic use and ongoing nausea and vomiting. -Trial of IV erythromycin for possible gastroparesis. -If no improvement, we may consider repeat EGD for further evaluation but probability of finding any clinically significant findings on EGD is low given normal blood work and negative CT scan.  Had EGD in March 2022 which showed just gastritis. -Continue PPI and Carafate -Change MiraLAX to twice daily for constipation. -GI will follow   Otis Brace MD, [place] 03/13/2022, 2:23 PM  Contact #  [phone]

## 2022-03-13 NOTE — Progress Notes (Signed)
Assessed BUE for PIV.  No suitable veins noted.  Pt requesting PICC placement as well as this nurse recommending a PICC.  Pt has had PICC lines placed by this nurse in the past.  Dr Lonny Prude paged and sent a secure chat re this need, r/t inadequate PIV access, multiple attempts, hx of Phenergan infiltrate with significant scarring noted on RUE and LUE with amputation.  No reply at this time from Dr Lonny Prude, reported to next shift VAS RN.

## 2022-03-13 NOTE — Progress Notes (Signed)
SMOG enema given, patient was able to hold 456m. Patient has had two large bowel movements since given enema.

## 2022-03-13 NOTE — Progress Notes (Signed)
Peripherally Inserted Central Catheter Placement  The IV Nurse has discussed with the patient and/or persons authorized to consent for the patient, the purpose of this procedure and the potential benefits and risks involved with this procedure.  The benefits include less needle sticks, lab draws from the catheter, and the patient may be discharged home with the catheter. Risks include, but not limited to, infection, bleeding, blood clot (thrombus formation), and puncture of an artery; nerve damage and irregular heartbeat and possibility to perform a PICC exchange if needed/ordered by physician.  Alternatives to this procedure were also discussed.  Bard Power PICC patient education guide, fact sheet on infection prevention and patient information card has been provided to patient /or left at bedside.    PICC Placement Documentation  PICC Double Lumen 74/16/38 Right Basilic 38 cm 0 cm (Active)  Indication for Insertion or Continuance of Line Poor Vasculature-patient has had multiple peripheral attempts or PIVs lasting less than 24 hours 03/13/22 1728  Exposed Catheter (cm) 0 cm 03/13/22 1728  Site Assessment Clean, Dry, Intact 03/13/22 1728  Lumen #1 Status Flushed;Saline locked;Blood return noted 03/13/22 1728  Lumen #2 Status Saline locked;Flushed;Blood return noted 03/13/22 1728  Dressing Type Transparent;Securing device 03/13/22 1728  Dressing Status Antimicrobial disc in place;Clean, Dry, Intact 03/13/22 1728  Safety Lock Not Applicable 45/36/46 8032  Line Care Connections checked and tightened 03/13/22 1728  Line Adjustment (NICU/IV Team Only) No 03/13/22 1728  Dressing Intervention New dressing 03/13/22 1728  Dressing Change Due 03/20/22 03/13/22 1728       Rolena Infante 03/13/2022, 5:29 PM

## 2022-03-13 NOTE — TOC Progression Note (Signed)
Transition of Care [facility]) - Progression Note    Patient Details  Name: Krystal [name] MRN: [id number] Date of Birth: 1990-05-26  Transition of Care [facility]) CM/SW [place], RN Phone Number: 03/13/2022, 12:55 PM  Clinical Narrative:   This RNCM following up regarding OP psych referral, resources have been attached for patient to coordinate services within the community.        Expected Discharge Plan: Home/Self Care Barriers to Discharge: Continued Medical Work up  Expected Discharge Plan and Services   Discharge Planning Services: CM Consult   Living arrangements for the past 2 months: Single Family Home                                       Social Determinants of Health (SDOH) Interventions SDOH Screenings   Food Insecurity: Food Insecurity Present (03/12/2022)  Housing: Low Risk  (03/12/2022)  Transportation Needs: No Transportation Needs (03/12/2022)  Recent Concern: Transportation Needs - Unmet Transportation Needs (03/12/2022)  Utilities: At Risk (03/12/2022)  Depression (PHQ2-9): High Risk (11/11/2021)  Tobacco Use: Low Risk  (03/05/2022)    Readmission Risk Interventions     No data to display

## 2022-03-14 DIAGNOSIS — N3010 Interstitial cystitis (chronic) without hematuria: Secondary | ICD-10-CM | POA: Diagnosis not present

## 2022-03-14 DIAGNOSIS — F4310 Post-traumatic stress disorder, unspecified: Secondary | ICD-10-CM | POA: Diagnosis not present

## 2022-03-14 DIAGNOSIS — I169 Hypertensive crisis, unspecified: Secondary | ICD-10-CM | POA: Diagnosis not present

## 2022-03-14 DIAGNOSIS — R112 Nausea with vomiting, unspecified: Secondary | ICD-10-CM | POA: Diagnosis not present

## 2022-03-14 LAB — BASIC METABOLIC PANEL
Anion gap: 11 (ref 5–15)
BUN: 18 mg/dL (ref 6–20)
CO2: 30 mmol/L (ref 22–32)
Calcium: 9.3 mg/dL (ref 8.9–10.3)
Chloride: 96 mmol/L — ABNORMAL LOW (ref 98–111)
Creatinine, Ser: 1.31 mg/dL — ABNORMAL HIGH (ref 0.44–1.00)
GFR, Estimated: 55 mL/min — ABNORMAL LOW (ref >60)
Glucose, Bld: 118 mg/dL — ABNORMAL HIGH (ref 70–99)
Potassium: 4.6 mmol/L (ref 3.5–5.1)
Sodium: 137 mmol/L (ref 135–145)

## 2022-03-14 MED ORDER — HYDROMORPHONE HCL 1 MG/ML IJ SOLN
1.0000 mg | INTRAMUSCULAR | Status: DC | PRN
  Administered 2022-03-14 – 2022-03-17 (×14): 1 mg via INTRAVENOUS
  Filled 2022-03-14 (×14): qty 1

## 2022-03-14 MED ORDER — BOOST / RESOURCE BREEZE PO LIQD CUSTOM
1.0000 | Freq: Three times a day (TID) | ORAL | Status: DC
  Administered 2022-03-15 – 2022-03-16 (×3): 1 via ORAL

## 2022-03-14 MED FILL — Diphenhydramine HCl Cap 25 MG: ORAL | Qty: 1 | Status: AC

## 2022-03-14 NOTE — Progress Notes (Addendum)
Have been in pt's room multiple times today for numerous issues - each time pt was either crying or snoring. Have given Toradol, Ditropan, Zofran, Phenergan, Oxycodone and Dilaudid IV twice. Also has scheduled Ativan tid. Pt continues to call out frequently asking when is her next medicine due (in spite of her asking and me answering this several times). Even the CN has met w pt and reviewed all of her standing and prn meds. Pt states her pain is "in my bladder." Pt also states that her nausea is bc her pain "is not well controlled," but then also states that the pain med causes her nausea. Instructed pt several times today to NOT consume milk products, greasy or spicy foods as this will definitely make her nausea worse. Yet several times today pt is asking for Ensure, ice cream and has several snacks at her bedside (including several pudding cups). Have allowed pt to vent her various fears and concerns and support offered.

## 2022-03-14 NOTE — TOC Progression Note (Signed)
Transition of Care [facility]) - Progression Note    Patient Details  Name: Krystal [name] MRN: [id number] Date of Birth: 19-Feb-1991  Transition of Care [facility]) CM/SW Contact  Servando Snare, [place] Phone Number: 03/14/2022, 11:12 AM  Clinical Narrative:   Notified by chaplain that patient would like counseling resources. TOC double checked counseling resources are on patient AVS. Patient was seen by TOC on 1/17 to discuss resources.       Expected Discharge Plan: Home/Self Care Barriers to Discharge: Continued Medical Work up  Expected Discharge Plan and Services   Discharge Planning Services: CM Consult   Living arrangements for the past 2 months: Single Family Home                                       Social Determinants of Health (SDOH) Interventions SDOH Screenings   Food Insecurity: Food Insecurity Present (03/12/2022)  Housing: Low Risk  (03/12/2022)  Transportation Needs: No Transportation Needs (03/12/2022)  Recent Concern: Transportation Needs - Unmet Transportation Needs (03/12/2022)  Utilities: At Risk (03/12/2022)  Depression (PHQ2-9): High Risk (11/11/2021)  Tobacco Use: Low Risk  (03/05/2022)    Readmission Risk Interventions     No data to display

## 2022-03-14 NOTE — Plan of Care (Signed)
Nutrition Education Note  RD consulted for nutrition education regarding gastroparesis    RD provided "Gastroparesis Nutrition Therapy" handout from the Academy of Nutrition and Dietetics. Reviewed patient's dietary recall.  Patient reports she tries to eat 3 meals a day and cooks a lot at home. Howver, she reports her intake usually depends on her kids eating habits, as they usually snack throughout the day.  Breakfast: french toast or egg in a whole or pancakes Lunch: leftovers in a quesadilla with cheese Dinner: baked chicken with green beans and mashed potatoes Supplements: 1 or 2 Premier Protein daily  Patient has been documented to be eating mostly 100% of meals despite reported nausea and vomiting. RN reports patient has been eating very well. Had been requesting Ensure but was changed to [facility] for low fat and fiber.  Advised patient to eat multiple small meals, avoid high fat foods, and avoid high fiber foods. Advised patient to chew foods well before swallowing, drink fluids throughout meals, and to sit up or walk after meals if possible. Gave patient examples of foods recommended and those not recommended.  Teach back method used.  Expect fair compliance. Patient reports she will order dinner soon and use handout to help order.  Body mass index is 33.12 kg/m. Pt meets criteria for Obesity I based on current BMI.  Current diet order is Soft, patient is consuming approximately 100% of meals at this time. Labs and medications reviewed. No further nutrition interventions warranted at this time. RD contact information provided. If additional nutrition issues arise, please re-consult RD.   Samson Frederic RD, LDN For contact information, refer to [facility].
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
## 2022-03-14 NOTE — Progress Notes (Signed)
Chaplain attempted to provide follow up support to Krystal [name], particularly around her daughter's surgery today.  Krystal [name] was asleep.  Chaplains will follow up next week, but please page if needs arise.  [address] [place] Pager, [phone]

## 2022-03-14 NOTE — Progress Notes (Signed)
PROGRESS NOTE    Krystal [name]  BOF:[id number] DOB: [birth year] DOA: 03/05/2022 PCP: Patient, No Pcp Per   Brief Narrative: Krystal [name] is a 32 y.o. 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 Patient presented secondary to right eye vision loss with associated severely elevated hypertension consistent with hypertensive emergency. Patient was admitted to the ICU for Cardene IV infusion to better control hypertension. Hospitalization has been complicated by intractable nausea/vomiting which is responding to erythromycin.   Assessment and Plan: * 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 -Continue amlodipine, propranolol, losartan, spironolactone  Intractable nausea and vomiting Persistent. Possibly contributed to by severe anxiety. History of THC use which could also be contributing. No evidence of bowel obstruction. Psychiatry consulted for concern of behavioral health component. GI consulted today with concern for possible gastroparesis vs multifactorial cause. GI recommended gastric emptying study, however patient cannot tolerate at this time. GI started erythromycin and patient appears to be responding positively -GI recommendations: Protonix, sucralfate, erythromycin, dietitian consult  PTSD (post-traumatic stress disorder) See problem, Anxiety  Retinal vein occlusion of right eye Patient evaluated by ophthalmology, Dr. Manuella Ghazi with recommendation for follow-up in 1-2 weeks post-discharge  Interstitial cystitis Chronic issue. Likely complicating current admission. Patient declined oxybutynin.  Hypothyroidism -Continue Synthroid  GERD with esophagitis -Continue Protonix  Anxiety Significant anxiety regarding daughter's upcoming surgery. -Psychiatry recommendations: Effexor, Ativan  scheduled    DVT prophylaxis: Heparin subq Code Status:   Code Status: Full Code Family Communication: None Disposition Plan: Discharge pending ability to tolerate oral intake   Consultants:  [facility]iat[facility]  Procedures:  None  Antimicrobials: Ceftriaxone Keflex    Subjective: Patient reports some emesis but symptoms have improved. She has been eating slower which helps. She reports a lot of anxiety and having nightmares surrounding her daughter's upcoming heart surgery. We had a lengthy discussion at bedside. Patient also reports some worsening diplopia.  Objective: BP (!) 149/91 (BP Location: Left Arm)   Pulse 97   Temp 98.5 F (36.9 C) (Oral)   Resp 18   Ht '5\' 11"'$  (1.803 m)   Wt 107.7 kg   LMP 02/05/2022   SpO2 94%   BMI 33.12 kg/m   Examination:  General exam: Appears mostly calm and comfortable Respiratory system: Clear to auscultation. Respiratory effort normal. Cardiovascular system: S1 & S2 heard, RRR. Gastrointestinal system: Abdomen is nondistended, soft and nontender. Normal bowel sounds heard. Central nervous system: Alert and oriented. No focal neurological deficits.   Data Reviewed: I have personally reviewed following labs and imaging studies  CBC Lab Results  Component Value Date   WBC 8.1 03/12/2022   RBC 4.45 03/12/2022   HGB 14.6 03/12/2022   HCT 43.3 03/12/2022   MCV 97.3 03/12/2022   MCH 32.8 03/12/2022   PLT 275 03/12/2022   MCHC 33.7 03/12/2022   RDW 12.0 03/12/2022   LYMPHSABS 1.8 03/12/2022   MONOABS 1.0 03/12/2022   EOSABS 0.0 03/12/2022   BASOSABS 0.0 77/82/4235     Last metabolic panel Lab Results  Component Value Date   NA 137 03/14/2022   K 4.6 03/14/2022   CL 96 (L) 03/14/2022   CO2 30 03/14/2022   BUN 18 03/14/2022   CREATININE 1.31 (H) 03/14/2022   GLUCOSE 118 (H) 03/14/2022   GFRNONAA 55 (L) 03/14/2022   GFRAA >60 12/18/2017   CALCIUM 9.3 03/14/2022   PHOS  2.9 03/08/2022   PROT 6.8  03/12/2022   ALBUMIN 3.8 03/12/2022   LABGLOB 2.1 08/14/2021   AGRATIO 1.9 08/14/2021   BILITOT 0.5 03/12/2022   ALKPHOS 49 03/12/2022   AST 28 03/12/2022   ALT 28 03/12/2022   ANIONGAP 11 03/14/2022    GFR: Estimated Creatinine Clearance: 82.5 mL/min (A) (by C-G formula based on SCr of 1.31 mg/dL (H)).  Recent Results (from the past 240 hour(s))  Resp panel by RT-PCR (RSV, Flu A&B, Covid) Anterior Nasal Swab     Status: None   Collection Time: 03/05/22 11:51 AM   Specimen: Anterior Nasal Swab  Result Value Ref Range Status   SARS Coronavirus 2 by RT PCR NEGATIVE NEGATIVE Final    Comment: (NOTE) SARS-CoV-2 target nucleic acids are NOT DETECTED.  The SARS-CoV-2 RNA is generally detectable in upper respiratory specimens during the acute phase of infection. The lowest concentration of SARS-CoV-2 viral copies this assay can detect is 138 copies/mL. A negative result does not preclude SARS-Cov-2 infection and should not be used as the sole basis for treatment or other patient management decisions. A negative result may occur with  improper specimen collection/handling, submission of specimen other than nasopharyngeal swab, presence of viral mutation(s) within the areas targeted by this assay, and inadequate number of viral copies(<138 copies/mL). A negative result must be combined with clinical observations, patient history, and epidemiological information. The expected result is Negative.  Fact Sheet for Patients:  EntrepreneurPulse.com.au  Fact Sheet for Healthcare Providers:  IncredibleEmployment.be  This test is no t yet approved or cleared by the [place] FDA and  has been authorized for detection and/or diagnosis of SARS-CoV-2 by FDA under an Emergency Use Authorization (EUA). This EUA will remain  in effect (meaning this test can be used) for the duration of the COVID-19 declaration under Section 564(b)(1) of the Act,  21 U.S.C.section 360bbb-3(b)(1), unless the authorization is terminated  or revoked sooner.       Influenza A by PCR NEGATIVE NEGATIVE Final   Influenza B by PCR NEGATIVE NEGATIVE Final    Comment: (NOTE) The Xpert Xpress SARS-CoV-2/FLU/RSV plus assay is intended as an aid in the diagnosis of influenza from Nasopharyngeal swab specimens and should not be used as a sole basis for treatment. Nasal washings and aspirates are unacceptable for Xpert Xpress SARS-CoV-2/FLU/RSV testing.  Fact Sheet for Patients: EntrepreneurPulse.com.au  Fact Sheet for Healthcare Providers: IncredibleEmployment.be  This test is not yet approved or cleared by the [place] FDA and has been authorized for detection and/or diagnosis of SARS-CoV-2 by FDA under an Emergency Use Authorization (EUA). This EUA will remain in effect (meaning this test can be used) for the duration of the COVID-19 declaration under Section 564(b)(1) of the Act, 21 U.S.C. section 360bbb-3(b)(1), unless the authorization is terminated or revoked.     Resp Syncytial Virus by PCR NEGATIVE NEGATIVE Final    Comment: (NOTE) Fact Sheet for Patients: EntrepreneurPulse.com.au  Fact Sheet for Healthcare Providers: IncredibleEmployment.be  This test is not yet approved or cleared by the [place] FDA and has been authorized for detection and/or diagnosis of SARS-CoV-2 by FDA under an Emergency Use Authorization (EUA). This EUA will remain in effect (meaning this test can be used) for the duration of the COVID-19 declaration under Section 564(b)(1) of the Act, 21 U.S.C. section 360bbb-3(b)(1), unless the authorization is terminated or revoked.  Performed at [facility], [place] [address] [place], [place] [address]   MRSA Next Gen by  PCR, Nasal     Status: None   Collection Time: 03/05/22  6:07 PM   Specimen: Nasal Mucosa; Nasal Swab   Result Value Ref Range Status   MRSA by PCR Next Gen NOT DETECTED NOT DETECTED Final    Comment: (NOTE) The GeneXpert MRSA Assay (FDA approved for NASAL specimens only), is one component of a comprehensive MRSA colonization surveillance program. It is not intended to diagnose MRSA infection nor to guide or monitor treatment for MRSA infections. Test performance is not FDA approved in patients less than 3 years old. Performed at [facility], [place] [address], [place], [place] [address]       Radiology Studies: [place] EKG SITE RITE  Result Date: 03/13/2022 If Site Rite image not attached, placement could not be confirmed due to current cardiac rhythm.     LOS: 9 days    Cordelia Poche, MD [facility] 03/14/2022, 12:34 PM   If 7PM-7AM, please contact night-coverage [email or web address]

## 2022-03-14 NOTE — Progress Notes (Signed)
Call from radiology re gastric emptying study. Stated that they cannot do this exam unless the pt is off of ALL narcotics for 12 hours. Noted pt is taking Ativan tid as well as prn pain meds, MD notified of above.

## 2022-03-14 NOTE — Progress Notes (Addendum)
[facility] Progress Note  Krystal [name] 32 y.o. 1990-10-13  CC: Nausea and vomiting   Subjective: Patient seen and examined at bedside.  Feeling better today.  Denied any vomiting today.  Last bowel movement was yesterday.  ROS : afebrile, anxiety improved.   Objective: Vital signs in last 24 hours: Vitals:   03/13/22 2024 03/14/22 0641  BP: (!) 143/87 (!) 149/91  Pulse: 81 97  Resp: 18 18  Temp: 98.5 F (36.9 C) 98.5 F (36.9 C)  SpO2: 97% 94%    Physical Exam:  General:   Alert,  Well-developed, well-nourished, pleasant and cooperative in NAD Lungs: No visible respiratory distress Heart:  Regular rate and rhythm; no murmurs, clicks, rubs,  or gallops. Abdomen: Scar marks noted, epigastric discomfort to palpation, abdomen is otherwise soft, bowel sounds present, no peritoneal signs She is somewhat depressed and anxious. Alert and oriented x 3  Lab Results: Recent Labs    03/12/22 0456 03/14/22 0940  NA 134* 137  K 4.4 4.6  CL 97* 96*  CO2 29 30  GLUCOSE 130* 118*  BUN 16 18  CREATININE 0.83 1.31*  CALCIUM 9.3 9.3  MG 2.2  --    Recent Labs    03/12/22 0456  AST 28  ALT 28  ALKPHOS 49  BILITOT 0.5  PROT 6.8  ALBUMIN 3.8   Recent Labs    03/12/22 0456  WBC 8.1  NEUTROABS 5.3  HGB 14.6  HCT 43.3  MCV 97.3  PLT 275   No results for input(s): "LABPROT", "INR" in the last 72 hours.    Assessment/Plan: -Chronic nausea and vomiting with worsening symptoms 6 months ago.  She gets vomiting even when she is fasting.  Symptoms worse after food intake and usually throws up undigested food even 6 hours after eating.  Need to rule out gastroparesis.  Nausea and vomiting likely multifactorial. -History of marijuana use.  Last use 3 days prior to admission -Anxiety and depression -Possible chronic pain syndrome -Constipation   Recommendations ------------------------- - significant improvement in symptoms with IV erythromycin.   Continued for now. - Continue PPI and Carafate - MiraLAX to twice daily for constipation. -Nutrition consult for gastroparesis diet  -If no improvement, we may consider repeat EGD for further evaluation but probability of finding any clinically significant findings on EGD is low given normal blood work and negative CT scan.  Had EGD in March 2022 which showed just gastritis.  -GI will follow   Otis Brace MD, [place] 03/14/2022, 10:37 AM  Contact #  [phone]

## 2022-03-14 NOTE — Discharge Instructions (Signed)
Gastroparesis Nutrition Therapy  Gastroparesis means that your stomach empties very slowly. This happens when the nerves to your stomach are damaged or do not work properly. This can cause bloating, stomach discomfort or pain, feeling full after eating only a small amount of food, nausea, or vomiting. If you have diabetes in addition to gastroparesis, it is important to control your blood glucose. This will help the stomach empty.  Tips Following these tips may help your stomach empty faster:  Eat small, frequent meals (4 to 6 times per day).  Do not eat solid foods that are high in fat and do not add too much fat to foods. See the Foods Not Recommended table for foods that are high fat.  High-fat solid foods may delay the emptying of your stomach.  Liquids that contain fat, such as milkshakes, may be tolerated and can provide needed calories. Do not eat foods high in fiber. Do not take fiber supplements or fiber bulking agents for constipation.  Do not eat foods that increase acid reflux:  Acidic, spicy, fried and greasy foods  Caffeine  Mint Do not drink alcohol or smoke  Do not drink carbonated beverages, as they increase bloating.  Chew foods well before swallowing. Solid foods in the stomach do not empty well. If you have difficulty tolerating solid foods, ground foods may be better.  If symptoms are severe, semi-solid foods or liquids may need to be your main food sources. Choose liquid nutritional supplements that have less than or equal to 2 grams fiber per serving.  Sit upright while eating and sit upright or walk after meals. Do not lie down for 3 to 4 hours after eating to avoid reflux or regurgitation.  If you wish to nap during the day, nap first and then eat.  Drinking fluids at meals can take up room in your stomach, and you might not get enough calories. At every meal, first eat a grain food and a protein food or dairy product if your body can tolerate it. Drink fluids with  calories. It may be better to delay fluids until after the meal and drink more between meals.   Foods Recommended Food Group Foods Recommended   Grains Choose grain foods with less than 2 grams of fiber per serving; these will be made with white flour Crackers: saltines or graham crackers Cold cereal: puffed rice Cream of rice or wheat Grits (fine ground) Gluten free low fiber foods Pretzels White bread, toasted White rice, cook until very soft  Protein Foods Lean meat and poultry: well-cooked, very tender, moist, and chopped fine Fish: tuna, salmon, or white fish Egg whites, scrambled Peanut butter (limit to 1 tablespoon at a time)  Dairy Milk*, drink 2% if tolerated to get more nutrients or lactose-free 2% milk Fortified non-dairy milks: almond, cashew, coconut, or rice (be aware that these options are not good sources of protein so you will need to eat an additional protein food) Fortified pea milk or soymilk (may cause gas and bloating for some) Instant breakfast* (pre-made lactose-free is sold in bottles) Milkshakes* (try blending in  to  cup canned fruit) Ice cream* (low-fat may be tolerated better; use in milkshakes to increase calories) Frozen yogurt Yogurt* Puddings and custard* Sherbet Liquid nutritional supplements with less than or equal to 2 grams fiber per 1 cup serving *Use lactose-free varieties to reduce gas and bloating  Vegetables Canned and well-cooked vegetables without seeds, skins or hulls Carrots, cooked Mashed potatoes (white, red or yellow) Sweet  potato  Fruit Canned, soft and well-cooked fruits without seeds, skins or membranes Applesauce Banana, mashed may be tolerated better Diced peaches/pears fruit cups in juice Melon, very soft, cut into small pieces Fruit nectar juices  Oils When possible choose oils rather than solid fats Canola or olive oil Margarine  Other Clear soup Gelatin Popsicles   Foods Not Recommended Food Group Foods Not  Recommended   Grains Bran Grains foods with 2 or more grams of fiber per serving: barley, brown rice, kasha, quinoa Popcorn Whole grain and high-fiber cereals, including oats or granola Whole grain bread or pasta  Protein Foods Fried meats, poultry or fish Sausage, bacon or hot dogs Seafood Tough meat, meat with gristle: steak, roast beef or pork chops Beans, peas or lentils Nuts  Dairy Cheese slices Liquid nutritional supplements that have more than 2 grams fiber per serving Pea milk, soymilk (may increase gas and bloating)  Vegetables Raw or undercooked vegetables Alfalfa, asparagus, bean sprouts, broccoli, brussels sprouts, cabbage, cauliflower, corn, green peas or any other kind of peas, lima beans, mushrooms, okra, onions, parsnips, peppers, pickles, potato skins, or spinach  Fruit Fresh fruit except for the ones in the foods recommended table Acidic fruit and juices: oranges/orange juice, grapefruit/grapefruit juice, tomatoes/tomato juice Avocado Berries Coconut Dried fruit Fruit skin Mandarin oranges Pineapple  Oils Fried foods of any type  Other Coffee Olives or pickles Gwendalyn [employer]   Copyright 2020  Academy of [employer] Dietetics. All rights reserved.

## 2022-03-15 DIAGNOSIS — F4310 Post-traumatic stress disorder, unspecified: Secondary | ICD-10-CM | POA: Diagnosis not present

## 2022-03-15 DIAGNOSIS — N3010 Interstitial cystitis (chronic) without hematuria: Secondary | ICD-10-CM | POA: Diagnosis not present

## 2022-03-15 DIAGNOSIS — I169 Hypertensive crisis, unspecified: Secondary | ICD-10-CM | POA: Diagnosis not present

## 2022-03-15 DIAGNOSIS — R112 Nausea with vomiting, unspecified: Secondary | ICD-10-CM | POA: Diagnosis not present

## 2022-03-15 DIAGNOSIS — N3000 Acute cystitis without hematuria: Secondary | ICD-10-CM

## 2022-03-15 LAB — URINALYSIS, ROUTINE W REFLEX MICROSCOPIC
Bilirubin Urine: NEGATIVE
Glucose, UA: NEGATIVE mg/dL
Hgb urine dipstick: NEGATIVE
Ketones, ur: NEGATIVE mg/dL
Leukocytes,Ua: NEGATIVE
Nitrite: NEGATIVE
Protein, ur: NEGATIVE mg/dL
Specific Gravity, Urine: 1.006 (ref 1.005–1.030)
pH: 6 (ref 5.0–8.0)

## 2022-03-15 LAB — BASIC METABOLIC PANEL
Anion gap: 14 (ref 5–15)
BUN: 13 mg/dL (ref 6–20)
CO2: 27 mmol/L (ref 22–32)
Calcium: 9.2 mg/dL (ref 8.9–10.3)
Chloride: 100 mmol/L (ref 98–111)
Creatinine, Ser: 1.33 mg/dL — ABNORMAL HIGH (ref 0.44–1.00)
GFR, Estimated: 54 mL/min — ABNORMAL LOW (ref >60)
Glucose, Bld: 113 mg/dL — ABNORMAL HIGH (ref 70–99)
Potassium: 4.4 mmol/L (ref 3.5–5.1)
Sodium: 141 mmol/L (ref 135–145)

## 2022-03-15 MED ORDER — SODIUM CHLORIDE 0.9 % IV BOLUS
1000.0000 mL | Freq: Once | INTRAVENOUS | Status: AC
  Administered 2022-03-15: 1000 mL via INTRAVENOUS

## 2022-03-15 NOTE — Progress Notes (Signed)
[facility] Progress Note  Krystal [name] 32 y.o. 1990-08-10  CC: Nausea and vomiting   Subjective: Patient seen and examined at bedside.  Complaining of worsening suprapubic pain.  Unable to urinate.  Looks like she had been out catheterization with 400 cc urine.  She thinks her nausea is now related to her pain  ROS : afebrile, anxiety improved.   Objective: Vital signs in last 24 hours: Vitals:   03/15/22 0647 03/15/22 1325  BP: (!) 133/58 118/68  Pulse: 71 78  Resp: 18   Temp: 98.1 F (36.7 C) 98 F (36.7 C)  SpO2: 98% 98%    Physical Exam:  General:   Alert,  Well-developed, well-nourished, pleasant and cooperative in NAD Lungs: No visible respiratory distress Heart:  Regular rate and rhythm; no murmurs, clicks, rubs,  or gallops. Abdomen: Scar marks noted, suprapubic tenderness to palpation, abdomen is otherwise soft, bowel sounds present, no peritoneal signs She is somewhat depressed and anxious. Alert and oriented x 3  Lab Results: Recent Labs    03/14/22 0940 03/15/22 1100  NA 137 141  K 4.6 4.4  CL 96* 100  CO2 30 27  GLUCOSE 118* 113*  BUN 18 13  CREATININE 1.31* 1.33*  CALCIUM 9.3 9.2   No results for input(s): "AST", "ALT", "ALKPHOS", "BILITOT", "PROT", "ALBUMIN" in the last 72 hours.  No results for input(s): "WBC", "NEUTROABS", "HGB", "HCT", "MCV", "PLT" in the last 72 hours.  No results for input(s): "LABPROT", "INR" in the last 72 hours.    Assessment/Plan: -Chronic nausea and vomiting with worsening symptoms 6 months ago.  She gets vomiting even when she is fasting.  Symptoms worse after food intake and usually throws up undigested food even 6 hours after eating.  Need to rule out gastroparesis.  Nausea and vomiting likely multifactorial. -History of marijuana use.  Last use 3 days prior to admission -Anxiety and depression -Possible chronic pain syndrome -Constipation    Recommendations ------------------------- -Continue IV erythromycin for 2 more days -She may need urology consult for ongoing suprapubic pain and urinary issues.  - Continue PPI and Carafate - MiraLAX to twice daily for constipation. -Nutrition consult for gastroparesis diet  -GI will follow periodically.   Otis Brace MD, [place] 03/15/2022, 2:42 PM  Contact #  [phone]

## 2022-03-15 NOTE — Assessment & Plan Note (Addendum)
Patient treated with Ceftriaxone in the ICU and completed course.

## 2022-03-15 NOTE — Progress Notes (Signed)
Bladder scan done - 298 ml I&Ocath done - 400 ml taken out from bladder.  Urine specimen sent for UA C&S. Will f/u result.

## 2022-03-15 NOTE — Progress Notes (Signed)
PROGRESS NOTE    Krystal [name]  OBS:[id number] DOB: 1990-09-23 DOA: 03/05/2022 PCP: Patient, No Pcp Per   Brief Narrative: Krystal [name] is a 32 y.o. 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 Patient presented secondary to right eye vision loss with associated severely elevated hypertension consistent with hypertensive emergency. Patient was admitted to the ICU for Cardene IV infusion to better control hypertension. Hospitalization has been complicated by intractable nausea/vomiting which is responding to erythromycin.   Assessment and Plan: * 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 -Continue amlodipine, propranolol, losartan, spironolactone  Intractable nausea and vomiting Persistent. Possibly contributed to by severe anxiety. History of THC use which could also be contributing. No evidence of bowel obstruction. Psychiatry consulted for concern of behavioral health component. GI consulted today with concern for possible gastroparesis vs multifactorial cause. GI recommended gastric emptying study, however patient cannot tolerate at this time. GI started erythromycin and patient appears to be responding positively. Nausea/vomiting significantly improved. -GI recommendations: Protonix, sucralfate, erythromycin, dietitian consult. Recommendations pending today  PTSD (post-traumatic stress disorder) See problem, Anxiety  Retinal vein occlusion of right eye Patient evaluated by ophthalmology, Dr. Manuella Ghazi with recommendation for follow-up in 1-2 weeks post-discharge  Interstitial cystitis Chronic issue. Likely complicating current admission. Patient declined oxybutynin. Worsening pain. -In/out catheterization -Urinalysis/urine culture  Hypothyroidism -Continue Synthroid  GERD with  esophagitis -Continue Protonix  Obesity Estimated body mass index is 33.12 kg/m as calculated from the following:   Height as of this encounter: '5\' 11"'$  (1.803 m).   Weight as of this encounter: 107.7 kg.  Anxiety Significant anxiety regarding daughter's upcoming surgery. -Psychiatry recommendations: Effexor, Ativan scheduled  Acute cystitis-resolved as of 03/15/2022 Patient treated with Ceftriaxone in the ICU and completed course.    DVT prophylaxis: Heparin subq Code Status:   Code Status: Full Code Family Communication: None Disposition Plan: Discharge pending ability to tolerate oral intake   Consultants:  [facility]iat[facility]  Procedures:  None  Antimicrobials: Ceftriaxone Keflex    Subjective: Patient states she is having significant dysuria a pelvic pain.  Objective: BP (!) 133/58 (BP Location: Left Arm)   Pulse 71   Temp 98.1 F (36.7 C) (Oral)   Resp 18   Ht '5\' 11"'$  (1.803 m)   Wt 107.7 kg   LMP 02/05/2022   SpO2 98%   BMI 33.12 kg/m   Examination:  General exam: Crying at the side of the bed Respiratory system: Clear to auscultation. Respiratory effort normal. Cardiovascular system: S1 & S2 heard, RRR. No murmurs. Gastrointestinal system: Abdomen is nondistended, soft and nontender. Normal bowel sounds heard. Central nervous system: Alert and oriented. Musculoskeletal: No edema. No calf tenderness   Data Reviewed: I have personally reviewed following labs and imaging studies  CBC Lab Results  Component Value Date   WBC 8.1 03/12/2022   RBC 4.45 03/12/2022   HGB 14.6 03/12/2022   HCT 43.3 03/12/2022   MCV 97.3 03/12/2022   MCH 32.8 03/12/2022   PLT 275 03/12/2022   MCHC 33.7 03/12/2022   RDW 12.0 03/12/2022   LYMPHSABS 1.8 03/12/2022   MONOABS 1.0 03/12/2022   EOSABS 0.0 03/12/2022   BASOSABS 0.0 47/65/4650     Last metabolic panel Lab Results  Component Value Date   NA 137 03/14/2022   K 4.6 03/14/2022   CL 96  (L) 03/14/2022   CO2 30 03/14/2022  BUN 18 03/14/2022   CREATININE 1.31 (H) 03/14/2022   GLUCOSE 118 (H) 03/14/2022   GFRNONAA 55 (L) 03/14/2022   GFRAA >60 12/18/2017   CALCIUM 9.3 03/14/2022   PHOS 2.9 03/08/2022   PROT 6.8 03/12/2022   ALBUMIN 3.8 03/12/2022   LABGLOB 2.1 08/14/2021   AGRATIO 1.9 08/14/2021   BILITOT 0.5 03/12/2022   ALKPHOS 49 03/12/2022   AST 28 03/12/2022   ALT 28 03/12/2022   ANIONGAP 11 03/14/2022    GFR: Estimated Creatinine Clearance: 82.5 mL/min (A) (by C-G formula based on SCr of 1.31 mg/dL (H)).  Recent Results (from the past 240 hour(s))  Resp panel by RT-PCR (RSV, Flu A&B, Covid) Anterior Nasal Swab     Status: None   Collection Time: 03/05/22 11:51 AM   Specimen: Anterior Nasal Swab  Result Value Ref Range Status   SARS Coronavirus 2 by RT PCR NEGATIVE NEGATIVE Final    Comment: (NOTE) SARS-CoV-2 target nucleic acids are NOT DETECTED.  The SARS-CoV-2 RNA is generally detectable in upper respiratory specimens during the acute phase of infection. The lowest concentration of SARS-CoV-2 viral copies this assay can detect is 138 copies/mL. A negative result does not preclude SARS-Cov-2 infection and should not be used as the sole basis for treatment or other patient management decisions. A negative result may occur with  improper specimen collection/handling, submission of specimen other than nasopharyngeal swab, presence of viral mutation(s) within the areas targeted by this assay, and inadequate number of viral copies(<138 copies/mL). A negative result must be combined with clinical observations, patient history, and epidemiological information. The expected result is Negative.  Fact Sheet for Patients:  EntrepreneurPulse.com.au  Fact Sheet for Healthcare Providers:  IncredibleEmployment.be  This test is no t yet approved or cleared by the [place] FDA and  has been authorized for detection  and/or diagnosis of SARS-CoV-2 by FDA under an Emergency Use Authorization (EUA). This EUA will remain  in effect (meaning this test can be used) for the duration of the COVID-19 declaration under Section 564(b)(1) of the Act, 21 U.S.C.section 360bbb-3(b)(1), unless the authorization is terminated  or revoked sooner.       Influenza A by PCR NEGATIVE NEGATIVE Final   Influenza B by PCR NEGATIVE NEGATIVE Final    Comment: (NOTE) The Xpert Xpress SARS-CoV-2/FLU/RSV plus assay is intended as an aid in the diagnosis of influenza from Nasopharyngeal swab specimens and should not be used as a sole basis for treatment. Nasal washings and aspirates are unacceptable for Xpert Xpress SARS-CoV-2/FLU/RSV testing.  Fact Sheet for Patients: EntrepreneurPulse.com.au  Fact Sheet for Healthcare Providers: IncredibleEmployment.be  This test is not yet approved or cleared by the [place] FDA and has been authorized for detection and/or diagnosis of SARS-CoV-2 by FDA under an Emergency Use Authorization (EUA). This EUA will remain in effect (meaning this test can be used) for the duration of the COVID-19 declaration under Section 564(b)(1) of the Act, 21 U.S.C. section 360bbb-3(b)(1), unless the authorization is terminated or revoked.     Resp Syncytial Virus by PCR NEGATIVE NEGATIVE Final    Comment: (NOTE) Fact Sheet for Patients: EntrepreneurPulse.com.au  Fact Sheet for Healthcare Providers: IncredibleEmployment.be  This test is not yet approved or cleared by the [place] FDA and has been authorized for detection and/or diagnosis of SARS-CoV-2 by FDA under an Emergency Use Authorization (EUA). This EUA will remain in effect (meaning this test can be used) for the duration of the COVID-19 declaration under Section 564(b)(1) of  the Act, 21 U.S.C. section 360bbb-3(b)(1), unless the authorization is terminated  or revoked.  Performed at [facility], [place] [address] [place], [place] [address]   MRSA Next Gen by PCR, Nasal     Status: None   Collection Time: 03/05/22  6:07 PM   Specimen: Nasal Mucosa; Nasal Swab  Result Value Ref Range Status   MRSA by PCR Next Gen NOT DETECTED NOT DETECTED Final    Comment: (NOTE) The GeneXpert MRSA Assay (FDA approved for NASAL specimens only), is one component of a comprehensive MRSA colonization surveillance program. It is not intended to diagnose MRSA infection nor to guide or monitor treatment for MRSA infections. Test performance is not FDA approved in patients less than 8 years old. Performed at [facility], [place] [address], [place], [place] [address]       Radiology Studies: [place] EKG SITE RITE  Result Date: 03/13/2022 If Site Rite image not attached, placement could not be confirmed due to current cardiac rhythm.     LOS: 10 days    Cordelia Poche, MD [facility] 03/15/2022, 11:30 AM   If 7PM-7AM, please contact night-coverage [email or web address]

## 2022-03-15 NOTE — Assessment & Plan Note (Addendum)
Estimated body mass index is 30.84 kg/m as calculated from the following:   Height as of this encounter: '5\' 11"'$  (1.803 m).   Weight as of this encounter: 100.3 kg.

## 2022-03-16 DIAGNOSIS — F4310 Post-traumatic stress disorder, unspecified: Secondary | ICD-10-CM | POA: Diagnosis not present

## 2022-03-16 DIAGNOSIS — N3010 Interstitial cystitis (chronic) without hematuria: Secondary | ICD-10-CM | POA: Diagnosis not present

## 2022-03-16 DIAGNOSIS — I169 Hypertensive crisis, unspecified: Secondary | ICD-10-CM | POA: Diagnosis not present

## 2022-03-16 DIAGNOSIS — R112 Nausea with vomiting, unspecified: Secondary | ICD-10-CM | POA: Diagnosis not present

## 2022-03-16 DIAGNOSIS — N179 Acute kidney failure, unspecified: Secondary | ICD-10-CM | POA: Insufficient documentation

## 2022-03-16 LAB — BASIC METABOLIC PANEL
Anion gap: 6 (ref 5–15)
BUN: 13 mg/dL (ref 6–20)
CO2: 30 mmol/L (ref 22–32)
Calcium: 9 mg/dL (ref 8.9–10.3)
Chloride: 104 mmol/L (ref 98–111)
Creatinine, Ser: 1.23 mg/dL — ABNORMAL HIGH (ref 0.44–1.00)
GFR, Estimated: 60 mL/min — ABNORMAL LOW (ref >60)
Glucose, Bld: 99 mg/dL (ref 70–99)
Potassium: 4.5 mmol/L (ref 3.5–5.1)
Sodium: 140 mmol/L (ref 135–145)

## 2022-03-16 LAB — URINE CULTURE: Culture: NO GROWTH

## 2022-03-16 NOTE — Progress Notes (Addendum)
PROGRESS NOTE    [name]  YKD:[id number] DOB: 05-May-1990 DOA: 03/05/2022 PCP: Patient, No Pcp Per   Brief Narrative: Krystal [name] is a 32 y.o. 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 Patient presented secondary to right eye vision loss with associated severely elevated hypertension consistent with hypertensive emergency. Patient was admitted to the ICU for Cardene IV infusion to better control hypertension. Hospitalization has been complicated by intractable nausea/vomiting which is responding to erythromycin.   Assessment and Plan: * 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 -Continue amlodipine, propranolol, losartan, spironolactone  Intractable nausea and vomiting Persistent. Possibly contributed to by severe anxiety. History of THC use which could also be contributing. No evidence of bowel obstruction. Psychiatry consulted for concern of behavioral health component. GI consulted today with concern for possible gastroparesis vs multifactorial cause. GI recommended gastric emptying study, however patient cannot tolerate at this time. GI started erythromycin and patient appears to be responding positively. Nausea/vomiting significantly improved. -GI recommendations: Protonix, sucralfate, erythromycin. Recommendations pending today  PTSD (post-traumatic stress disorder) See problem, Anxiety  Retinal vein occlusion of right eye Patient evaluated by ophthalmology, Dr. Manuella Ghazi with recommendation for follow-up in 1-2 weeks post-discharge  Interstitial cystitis Chronic issue. Likely complicating current admission. Patient declined oxybutynin. Worsening pain. Urinalysis unremarkable. Urine culture still pending. Patient required in/out cath x1. No abnormalities noted on external GU  exam. -Follow-up urine culture -Urology consult  Hypothyroidism -Continue Synthroid  GERD with esophagitis -Continue Protonix  AKI (acute kidney injury) ([place]) Baseline creatinine of about 0.9. Creatinine increased to 1.33. IV fluids given with improvement of creatinine.  Obesity Estimated body mass index is 33.12 kg/m as calculated from the following:   Height as of this encounter: '5\' 11"'$  (1.803 m).   Weight as of this encounter: 107.7 kg.  Anxiety Significant anxiety regarding daughter's upcoming surgery. -Psychiatry recommendations: Effexor, Ativan scheduled  Acute cystitis-resolved as of 03/15/2022 Patient treated with Ceftriaxone in the ICU and completed course.    DVT prophylaxis: Heparin subq Code Status:   Code Status: Full Code Family Communication: None Disposition Plan: Discharge pending ability to tolerate oral intake   Consultants:  [facility]iat[facility]  Procedures:  None  Antimicrobials: Ceftriaxone Keflex Erythromycin   Subjective: Patient reports continued pelvic pain and dysuria. Nausea/vomiting significantly improved.  Objective: BP (!) 141/79 (BP Location: Left Arm)   Pulse 62   Temp 97.8 F (36.6 C) (Oral)   Resp 14   Ht '5\' 11"'$  (1.803 m)   Wt 107.7 kg   LMP 02/05/2022   SpO2 98%   BMI 33.12 kg/m   Examination:  General exam: Appears calm and comfortable Respiratory system: Clear to auscultation. Respiratory effort normal. Cardiovascular system: S1 & S2 heard, RRR. No murmurs. Gastrointestinal system: Abdomen is nondistended, soft and nontender. Normal bowel sounds heard. Genitourinary: normal urethra, labia minora/majora with no rashes noted Central nervous system: Alert and oriented. No focal neurological deficits.   Data Reviewed: I have personally reviewed following labs and imaging studies  CBC Lab Results  Component Value Date   WBC 8.1 03/12/2022   RBC 4.45 03/12/2022   HGB 14.6 03/12/2022   HCT 43.3  03/12/2022   MCV 97.3 03/12/2022   MCH 32.8 03/12/2022   PLT 275 03/12/2022   MCHC 33.7 03/12/2022   RDW 12.0 03/12/2022   LYMPHSABS 1.8 03/12/2022   MONOABS 1.0 03/12/2022  EOSABS 0.0 03/12/2022   BASOSABS 0.0 63/84/5364     Last metabolic panel Lab Results  Component Value Date   NA 140 03/16/2022   K 4.5 03/16/2022   CL 104 03/16/2022   CO2 30 03/16/2022   BUN 13 03/16/2022   CREATININE 1.23 (H) 03/16/2022   GLUCOSE 99 03/16/2022   GFRNONAA 60 (L) 03/16/2022   GFRAA >60 12/18/2017   CALCIUM 9.0 03/16/2022   PHOS 2.9 03/08/2022   PROT 6.8 03/12/2022   ALBUMIN 3.8 03/12/2022   LABGLOB 2.1 08/14/2021   AGRATIO 1.9 08/14/2021   BILITOT 0.5 03/12/2022   ALKPHOS 49 03/12/2022   AST 28 03/12/2022   ALT 28 03/12/2022   ANIONGAP 6 03/16/2022    GFR: Estimated Creatinine Clearance: 87.9 mL/min (A) (by C-G formula based on SCr of 1.23 mg/dL (H)).  No results found for this or any previous visit (from the past 240 hour(s)).     Radiology Studies: No results found.    LOS: 11 days    Cordelia Poche, MD [facility] 03/16/2022, 10:48 AM   If 7PM-7AM, please contact night-coverage [email or web address]

## 2022-03-16 NOTE — Assessment & Plan Note (Signed)
Baseline creatinine of about 0.9. Creatinine increased to 1.33. IV fluids given with improvement of creatinine.

## 2022-03-17 DIAGNOSIS — I169 Hypertensive crisis, unspecified: Secondary | ICD-10-CM | POA: Diagnosis not present

## 2022-03-17 LAB — BASIC METABOLIC PANEL
Anion gap: 10 (ref 5–15)
BUN: 17 mg/dL (ref 6–20)
CO2: 29 mmol/L (ref 22–32)
Calcium: 8.9 mg/dL (ref 8.9–10.3)
Chloride: 100 mmol/L (ref 98–111)
Creatinine, Ser: 1.14 mg/dL — ABNORMAL HIGH (ref 0.44–1.00)
GFR, Estimated: >60 mL/min (ref >60)
Glucose, Bld: 129 mg/dL — ABNORMAL HIGH (ref 70–99)
Potassium: 4.1 mmol/L (ref 3.5–5.1)
Sodium: 139 mmol/L (ref 135–145)

## 2022-03-17 MED ORDER — GABAPENTIN 300 MG PO CAPS: 300.0000 mg | ORAL_CAPSULE | Freq: Two times a day (BID) | ORAL | 0 refills | Status: DC

## 2022-03-17 MED ORDER — OXYBUTYNIN CHLORIDE 5 MG PO TABS: 5.0000 mg | ORAL_TABLET | Freq: Three times a day (TID) | ORAL | 0 refills | Status: DC | PRN

## 2022-03-17 MED ORDER — OXYCODONE HCL 10 MG PO TABS: 10.0000 mg | ORAL_TABLET | Freq: Three times a day (TID) | ORAL | 0 refills | Status: AC | PRN

## 2022-03-17 MED ORDER — PANTOPRAZOLE SODIUM 40 MG PO TBEC
40.0000 mg | DELAYED_RELEASE_TABLET | Freq: Two times a day (BID) | ORAL | Status: DC
  Administered 2022-03-17: 40 mg via ORAL
  Filled 2022-03-17: qty 1

## 2022-03-17 MED ORDER — LOSARTAN POTASSIUM 100 MG PO TABS: 100.0000 mg | ORAL_TABLET | Freq: Every day | ORAL | 2 refills | Status: DC

## 2022-03-17 MED ORDER — ERYTHROMYCIN BASE 250 MG PO TBEC: 250.0000 mg | DELAYED_RELEASE_TABLET | Freq: Three times a day (TID) | ORAL | 0 refills | Status: DC

## 2022-03-17 MED ORDER — PROMETHAZINE HCL 25 MG PO TABS: 25.0000 mg | ORAL_TABLET | Freq: Four times a day (QID) | ORAL | 0 refills | Status: DC | PRN

## 2022-03-17 MED ORDER — CLONIDINE 0.3 MG/24HR TD PTWK: 0.3000 mg | MEDICATED_PATCH | TRANSDERMAL | 2 refills | Status: DC

## 2022-03-17 MED ORDER — LORAZEPAM 1 MG PO TABS: 1.0000 mg | ORAL_TABLET | Freq: Three times a day (TID) | ORAL | 0 refills | Status: DC

## 2022-03-17 MED ORDER — POLYETHYLENE GLYCOL 3350 17 G PO PACK: 17.0000 g | PACK | Freq: Two times a day (BID) | ORAL | 0 refills | Status: DC

## 2022-03-17 NOTE — Discharge Summary (Signed)
Physician Discharge Summary   Patient: Krystal [name] MRN: [id number] DOB: 1991/01/29  Admit date:     03/05/2022  Discharge date: 03/17/22  Discharge Physician: Cordelia Poche, MD   PCP: Patient, No Pcp Per   Recommendations at discharge:  PCP appointment for hospital follow-up GI follow-up for presumed gastroparesis Ophthalmology follow-up for retinal vein occlusion of right eye Urology follow-up for management of interstitial cystitis Behavioral health follow-up for anxiety/PTSD  Discharge Diagnoses: Principal Problem:   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 Active Problems:   Intractable nausea and vomiting   PTSD (post-traumatic stress disorder)   Retinal vein occlusion of right eye   Interstitial cystitis   Hypothyroidism   GERD with esophagitis   Hypertensive urgency   Anxiety   Obesity   AKI (acute kidney injury) ([place])  Resolved Problems:   Acute cystitis  Hospital Course: Maimuna Leaman Power is a 32 y.o. 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 Patient presented secondary to right eye vision loss with associated severely elevated hypertension consistent with hypertensive emergency. Patient was admitted to the ICU for Cardene IV infusion to better control hypertension. Hospitalization has been complicated by intractable nausea/vomiting which is responding to erythromycin.  Assessment and Plan: * 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 Discharge on clonidine patch, losartan, amlodipine and spironolactone. Hydralazine discontinued on discharge.  Intractable nausea and vomiting Persistent. Possibly contributed to by severe anxiety. History of THC use which could also be contributing. No evidence of bowel obstruction. Psychiatry consulted for concern of behavioral  health component. GI consulted today with concern for possible gastroparesis vs multifactorial cause. GI recommended gastric emptying study, however patient cannot tolerate at this time. GI started erythromycin and patient appears to be responding positively. Nausea/vomiting significantly improved. GI recommendation to continue erythromycin on discharge in addition to close GI follow-up.  PTSD (post-traumatic stress disorder) See problem, Anxiety  Retinal vein occlusion of right eye Patient evaluated by ophthalmology, Dr. Manuella Ghazi with recommendation for follow-up in 1-2 weeks post-discharge  Interstitial cystitis Chronic issue. Likely complicating current admission. Patient declined oxybutynin. Worsening pain. Repeat urinalysis unremarkable and repeat urine culture with no growth. Patient required in/out cath x1 but resumed spontaneous voiding. No abnormalities noted on external GU exam. Discussed case with urology on-call, Dr. Jeffie Pollock, who confirmed  patient was previously seen at his office but referred to Dr. Amalia Hailey at [facility]. Oxycodone provided for symptom relief.  Hypothyroidism Continue Synthroid.  GERD with esophagitis Protonix while inpatient. Resume Pepcid on discharge.  AKI (acute kidney injury) ([place]) Baseline creatinine of about 0.9. Creatinine increased to 1.33. IV fluids given with improvement of creatinine.  Obesity Estimated body mass index is 30.84 kg/m as calculated from the following:   Height as of this encounter: '5\' 11"'$  (1.803 m).   Weight as of this encounter: 100.3 kg.  Anxiety Significant anxiety regarding daughter's upcoming surgery. Psychiatry consulted and recommended continuing Effexor. Patient also started on Ativan. Recommendations for outpatient follow-up.  Acute cystitis-resolved as of 03/15/2022 Patient treated with Ceftriaxone in the ICU and completed course.    Consultants:  Psychiatry [facility] gastroenterology PCCM Ophthalmology  Procedures  performed: None  Disposition: Home Diet recommendation: Gastroparesis diet   DISCHARGE MEDICATION: Allergies as of 03/17/2022       Reactions   Coreg [carvedilol] Shortness Of Breath, Other (See Comments)   Triggers asthma   Other Shortness Of Breath, Other (  See Comments)   FANSIDAR FOR MALARIA-ASTHMA   Red Dye Other (See Comments)   ALL RED DYES CAUSE PROBLEMS #40 IS WORST-MIGRAINES    Compazine [prochlorperazine Maleate] Other (See Comments)   TWITCHING, CANT STAY STILL. Pt states she can tolerate promethazine   Vicodin [hydrocodone-acetaminophen] Itching, Nausea And Vomiting, Other (See Comments)   Pt states she can tolerate acetaminophen   Amitriptyline Hcl Other (See Comments)   "Angry moods"   Duloxetine Hcl Other (See Comments)   Passing out, trimble, irritable   Lisinopril Cough   Naproxen Other (See Comments)   Heartburn   Reglan [metoclopramide] Other (See Comments)   Restlessness   Nifedipine Nausea And Vomiting, Other (See Comments)   Discussed with patient and maternal fetal medicine, patient has intolerance but not frank allergy.        Medication List     STOP taking these medications    acetaminophen 500 MG tablet Commonly known as: TYLENOL   cephALEXin 250 MG capsule Commonly known as: KEFLEX   clonazePAM 1 MG tablet Commonly known as: KlonoPIN   diazepam 5 MG tablet Commonly known as: VALIUM   hydrALAZINE 100 MG tablet Commonly known as: APRESOLINE   ibuprofen 600 MG tablet Commonly known as: ADVIL   naproxen 500 MG tablet Commonly known as: NAPROSYN   oxyCODONE-acetaminophen 5-325 MG tablet Commonly known as: PERCOCET/ROXICET       TAKE these medications    albuterol 108 (90 Base) MCG/ACT inhaler Commonly known as: Ventolin HFA Inhale 2 puffs into the lungs every 4 (four) hours as needed for wheezing or shortness of breath (coughing fits).   amLODipine 10 MG tablet Commonly known as: NORVASC Take 1 tablet (10 mg total) by  mouth daily.   cetirizine 10 MG tablet Commonly known as: ZyrTEC Allergy Take 1 tablet (10 mg total) by mouth 2 (two) times daily.   cloNIDine 0.3 mg/24hr patch Commonly known as: CATAPRES - Dosed in mg/24 hr Place 1 patch (0.3 mg total) onto the skin once a week. Start taking on: March 20, 2022   EPINEPHrine 0.3 mg/0.3 mL Soaj injection Commonly known as: EPI-PEN Inject 0.3 mg into the muscle as needed for anaphylaxis.   erythromycin 250 MG EC tablet Commonly known as: ERY-TAB Take 1 tablet (250 mg total) by mouth 3 (three) times daily.   famotidine 20 MG tablet Commonly known as: PEPCID Take 1 tablet (20 mg total) by mouth 2 (two) times daily.   gabapentin 300 MG capsule Commonly known as: NEURONTIN Take 1 capsule (300 mg total) by mouth 2 (two) times daily.   levothyroxine 125 MCG tablet Commonly known as: Synthroid Take 1 tablet (125 mcg total) by mouth daily before breakfast.   LORazepam 1 MG tablet Commonly known as: ATIVAN Take 1 tablet (1 mg total) by mouth 3 (three) times daily.   losartan 100 MG tablet Commonly known as: COZAAR Take 1 tablet (100 mg total) by mouth daily. Start taking on: March 18, 2022   lurasidone 20 MG Tabs tablet Commonly known as: Latuda Take 1 tablet (20 mg total) by mouth daily. Take with food (dinner)   ondansetron 4 MG disintegrating tablet Commonly known as: ZOFRAN-ODT Take 1 tablet (4 mg total) by mouth every 8 (eight) hours as needed for nausea or vomiting.   oxybutynin 5 MG tablet Commonly known as: DITROPAN Take 1 tablet (5 mg total) by mouth every 8 (eight) hours as needed for bladder spasms.   Oxycodone HCl 10 MG Tabs Take 1 tablet (  10 mg total) by mouth 3 (three) times daily as needed for up to 3 days for moderate pain or severe pain.   polyethylene glycol 17 g packet Commonly known as: MIRALAX / GLYCOLAX Take 17 g by mouth 2 (two) times daily.   PRE-NATAL PO Take 1 tablet by mouth daily.   promethazine 25  MG tablet Commonly known as: PHENERGAN Take 1 tablet (25 mg total) by mouth every 6 (six) hours as needed for nausea or vomiting.   propranolol 20 MG tablet Commonly known as: INDERAL Take 1 tablet (20 mg total) by mouth 2 (two) times daily as needed (for anxiety).   spironolactone 25 MG tablet Commonly known as: ALDACTONE Take 1 tablet (25 mg total) by mouth daily.   Symbicort 80-4.5 MCG/ACT inhaler Generic drug: budesonide-formoterol Inhale 2 puffs into the lungs 2 (two) times daily.   venlafaxine XR 75 MG 24 hr capsule Commonly known as: EFFEXOR-XR Take 3 capsules (225 mg total) by mouth daily.        Follow-up Information     Danice Goltz, MD Follow up in 1 week(s).   Specialty: Ophthalmology Why: For hospital follow-up for your eye Contact information: [place] [address] [phone]         Domingo Pulse, MD Follow up.   Specialty: Urology Why: Establish care for management in interstitial cystitis. Contact information: [address] [place] [address] [phone]         [facility] Follow up in 3 day(s).   Specialty: Behavioral Health Why: Appointment scheduled with Dr. Charlette Caffey on the 26th at 11 AM.  This is a virtual appointment. Contact information: [place] [phone]               Discharge Exam: BP 119/84 (BP Location: Left Arm)   Pulse 61   Temp 97.8 F (36.6 C) (Oral)   Resp 16   Ht '5\' 11"'$  (1.803 m)   Wt 100.3 kg   LMP 02/05/2022   SpO2 100%   BMI 30.84 kg/m   General exam: Appears calm and comfortable Respiratory system: Clear to auscultation. Respiratory effort normal. Cardiovascular system: S1 & S2 heard, RRR. No murmurs, rubs, gallops or clicks. Gastrointestinal system: Abdomen is nondistended, soft and nontender. Normal bowel sounds heard. Central nervous system: Alert and  oriented. No focal neurological deficits. Psychiatry: Judgement and insight appear normal. Mood & affect appropriate.   Condition at discharge: stable  The results of significant diagnostics from this hospitalization (including imaging, microbiology, ancillary and laboratory) are listed below for reference.   Imaging Studies: [place] EKG SITE RITE  Result Date: 03/13/2022 If Site Rite image not attached, placement could not be confirmed due to current cardiac rhythm.  DG Abd 1 View  Result Date: 03/11/2022 CLINICAL DATA:  Bowel obstruction EXAM: ABDOMEN - 1 VIEW COMPARISON:  Radiograph 03/06/2022 FINDINGS: Nonobstructive bowel gas pattern. Mild-to-moderate stool burden. No radiopaque calculi overlie the kidneys. No acute osseous abnormality. Right upper quadrant surgical clips consistent with prior cholecystectomy. IMPRESSION: No evidence of bowel obstruction.  Mild-to-moderate stool burden. Electronically Signed   By: Maurine Simmering M.D.   On: 03/11/2022 14:43   DG CHEST PORT 1 VIEW  Result Date: 03/06/2022 CLINICAL DATA:  Hypoxia EXAM: PORTABLE CHEST 1 VIEW COMPARISON:  10/12/2021 FINDINGS: The lungs appear clear. Cardiac and mediastinal contours normal. No pleural effusion identified. No significant bony abnormality observed. IMPRESSION: 1.  No active cardiopulmonary disease is radiographically apparent. Electronically Signed   By: Van Clines M.D.   On: 03/06/2022 08:57   DG Abd 1 View  Result Date: 03/06/2022 CLINICAL DATA:  Nausea, pelvic/bladder pain. EXAM: ABDOMEN - 1 VIEW COMPARISON:  CT abdomen 03/02/2022 FINDINGS: Right upper quadrant clips from prior cholecystectomy. Unremarkable bowel gas pattern. No significant abnormal calcifications to indicate urinary tract calculi. No specific radiographic abnormality in the pelvis. IMPRESSION: 1. Unremarkable bowel gas pattern. 2. Prior cholecystectomy. Electronically Signed   By: Van Clines M.D.   On: 03/06/2022 08:56   MR Brain W  and Wo Contrast  Result Date: 03/05/2022 CLINICAL DATA:  Provided history: Diplopia. Additional history provided: The patient reports vision loss in right eye since Friday (02/28/2022). The patient also reports she is beginning to have double vision in her left eye. Headaches. EXAM: MRI HEAD WITHOUT AND WITH CONTRAST TECHNIQUE: Multiplanar, multiecho pulse sequences of the brain and surrounding structures were obtained without and with intravenous contrast. CONTRAST:  20m GADAVIST GADOBUTROL 1 MMOL/ML IV SOLN COMPARISON:  Head CT 03/02/2022.  Brain MRI 07/02/2021. FINDINGS: Mild intermittent motion degradation. Brain: Cerebral volume is normal. No cortical encephalomalacia is identified. No significant cerebral white matter disease. There is no acute infarct. No evidence of an intracranial mass. No chronic intracranial blood products. No extra-axial fluid collection. No midline shift. No pathologic intracranial enhancement identified. Vascular: Maintained flow voids within the proximal large arterial vessels. Skull and upper cervical spine: No focal suspicious marrow lesion. Sinuses/Orbits: No appreciable orbital mass or acute orbital abnormality. However please note, this examination is not specifically tailored for the assessment of the orbits. Trace mucosal thickening within right ethmoid air cells. IMPRESSION: Unremarkable MRI appearance of the brain. No evidence of acute intracranial abnormality. Electronically Signed   By: KKellie SimmeringD.O.   On: 03/05/2022 14:53   CT Head Wo Contrast  Result Date: 03/02/2022 CLINICAL DATA:  Hypertension, vision changes in the right eye. EXAM: CT HEAD WITHOUT CONTRAST TECHNIQUE: Contiguous axial images were obtained from the base of the skull through the vertex without intravenous contrast. RADIATION DOSE REDUCTION: This exam was performed according to the departmental dose-optimization program which includes automated exposure control, adjustment of the mA and/or kV  according to patient size and/or use of iterative reconstruction technique. COMPARISON:  Head CT 10/12/2021.  MRI brain 07/12/2021. FINDINGS: Brain: No evidence of acute infarction, hemorrhage, hydrocephalus, extra-axial collection or mass lesion/mass effect. Vascular: No hyperdense vessel or unexpected calcification. Skull: Normal. Negative for fracture or focal lesion. Sinuses/Orbits: No acute finding. Other: None. IMPRESSION: No acute intracranial abnormality. Electronically Signed   By: ARonney AstersM.D.   On: 03/02/2022 16:40   CT ABDOMEN PELVIS W CONTRAST  Result Date: 03/02/2022 CLINICAL DATA:  Left lower quadrant abdominal pain. Right lower quadrant abdominal pain. Lower abdominal pain. EXAM: CT ABDOMEN AND PELVIS WITH CONTRAST TECHNIQUE: Multidetector CT imaging of the abdomen and pelvis was performed using the standard protocol following bolus administration of intravenous contrast. RADIATION DOSE REDUCTION: This exam was performed according to the departmental dose-optimization program which includes automated exposure control, adjustment of the mA and/or kV according to patient size and/or use of iterative reconstruction technique. CONTRAST:  1024mOMNIPAQUE IOHEXOL 300 MG/ML  SOLN COMPARISON:  Abdominal ultrasound 07/21/2021. Abdominopelvic CT 05/25/2020. FINDINGS: Lower chest: Clear lung bases. No significant pleural or pericardial effusion. Hepatobiliary: The liver is normal in density without suspicious focal abnormality. No significant biliary dilatation status post cholecystectomy. Pancreas: Unremarkable. No pancreatic  ductal dilatation or surrounding inflammatory changes. Spleen: Stable diffuse lobularity of the spleen without acute abnormality or surrounding fluid collection. Adrenals/Urinary Tract: Both adrenal glands appear normal. No evidence of urinary tract calculus, suspicious renal lesion or hydronephrosis. Stable mild renal cortical lobularity bilaterally. The bladder appears  unremarkable for its degree of distention. Stomach/Bowel: No enteric contrast administered. The stomach appears unremarkable for its degree of distension. No evidence of bowel wall thickening, distention or surrounding inflammatory change. Status post cholecystectomy. Mildly prominent stool in the cecum. Vascular/Lymphatic: There are no enlarged abdominal or pelvic lymph nodes. No significant vascular findings. Reproductive: The uterus and ovaries appear unremarkable. Other: Postsurgical changes in the low anterior abdominal wall. No evidence of hernia, ascites or free air. Musculoskeletal: No acute or significant osseous findings. Stable 2.8 cm sclerotic lesion in the intertrochanteric region of the left proximal femur dating back to 10/12/2015, likely an incidental chondroid lesion. IMPRESSION: 1. No acute findings or explanation for the patient's symptoms. 2. Mildly prominent stool in the cecum. 3. Postsurgical changes as described. Electronically Signed   By: Richardean Sale M.D.   On: 03/02/2022 14:10    Microbiology: Results for orders placed or performed during the hospital encounter of 03/05/22  Resp panel by RT-PCR (RSV, Flu A&B, Covid) Anterior Nasal Swab     Status: None   Collection Time: 03/05/22 11:51 AM   Specimen: Anterior Nasal Swab  Result Value Ref Range Status   SARS Coronavirus 2 by RT PCR NEGATIVE NEGATIVE Final    Comment: (NOTE) SARS-CoV-2 target nucleic acids are NOT DETECTED.  The SARS-CoV-2 RNA is generally detectable in upper respiratory specimens during the acute phase of infection. The lowest concentration of SARS-CoV-2 viral copies this assay can detect is 138 copies/mL. A negative result does not preclude SARS-Cov-2 infection and should not be used as the sole basis for treatment or other patient management decisions. A negative result may occur with  improper specimen collection/handling, submission of specimen other than nasopharyngeal swab, presence of viral  mutation(s) within the areas targeted by this assay, and inadequate number of viral copies(<138 copies/mL). A negative result must be combined with clinical observations, patient history, and epidemiological information. The expected result is Negative.  Fact Sheet for Patients:  EntrepreneurPulse.com.au  Fact Sheet for Healthcare Providers:  IncredibleEmployment.be  This test is no t yet approved or cleared by the [place] FDA and  has been authorized for detection and/or diagnosis of SARS-CoV-2 by FDA under an Emergency Use Authorization (EUA). This EUA will remain  in effect (meaning this test can be used) for the duration of the COVID-19 declaration under Section 564(b)(1) of the Act, 21 U.S.C.section 360bbb-3(b)(1), unless the authorization is terminated  or revoked sooner.       Influenza A by PCR NEGATIVE NEGATIVE Final   Influenza B by PCR NEGATIVE NEGATIVE Final    Comment: (NOTE) The Xpert Xpress SARS-CoV-2/FLU/RSV plus assay is intended as an aid in the diagnosis of influenza from Nasopharyngeal swab specimens and should not be used as a sole basis for treatment. Nasal washings and aspirates are unacceptable for Xpert Xpress SARS-CoV-2/FLU/RSV testing.  Fact Sheet for Patients: EntrepreneurPulse.com.au  Fact Sheet for Healthcare Providers: IncredibleEmployment.be  This test is not yet approved or cleared by the [place] FDA and has been authorized for detection and/or diagnosis of SARS-CoV-2 by FDA under an Emergency Use Authorization (EUA). This EUA will remain in effect (meaning this test can be used) for the duration of the COVID-19 declaration under  Section 564(b)(1) of the Act, 21 U.S.C. section 360bbb-3(b)(1), unless the authorization is terminated or revoked.     Resp Syncytial Virus by PCR NEGATIVE NEGATIVE Final    Comment: (NOTE) Fact Sheet for  Patients: EntrepreneurPulse.com.au  Fact Sheet for Healthcare Providers: IncredibleEmployment.be  This test is not yet approved or cleared by the [place] FDA and has been authorized for detection and/or diagnosis of SARS-CoV-2 by FDA under an Emergency Use Authorization (EUA). This EUA will remain in effect (meaning this test can be used) for the duration of the COVID-19 declaration under Section 564(b)(1) of the Act, 21 U.S.C. section 360bbb-3(b)(1), unless the authorization is terminated or revoked.  Performed at [facility], [place] [address] [place], [place] [address]   MRSA Next Gen by PCR, Nasal     Status: None   Collection Time: 03/05/22  6:07 PM   Specimen: Nasal Mucosa; Nasal Swab  Result Value Ref Range Status   MRSA by PCR Next Gen NOT DETECTED NOT DETECTED Final    Comment: (NOTE) The GeneXpert MRSA Assay (FDA approved for NASAL specimens only), is one component of a comprehensive MRSA colonization surveillance program. It is not intended to diagnose MRSA infection nor to guide or monitor treatment for MRSA infections. Test performance is not FDA approved in patients less than 69 years old. Performed at [facility], [place] [address], [place], [place] [address]   Urine Culture     Status: None   Collection Time: 03/15/22 11:35 AM   Specimen: In/Out Cath Urine  Result Value Ref Range Status   Specimen Description   Final    IN/OUT CATH URINE Performed at [facility] [address], [place], [place] [address]    Special Requests   Final    NONE Performed at [facility], [place] [address] [place], [place] [address]    Culture   Final    NO GROWTH Performed at [facility], [place] [address], [place], [place] [address]    Report Status 03/16/2022 FINAL  Final    Labs: CBC: Recent Labs  Lab 03/11/22 0309 03/12/22 0456  WBC 7.9 8.1   NEUTROABS  --  5.3  HGB 14.5 14.6  HCT 44.0 43.3  MCV 99.1 97.3  PLT 296 616   Basic Metabolic Panel: Recent Labs  Lab 03/12/22 0456 03/14/22 0940 03/15/22 1100 03/16/22 0317 03/17/22 0240  NA 134* 137 141 140 139  K 4.4 4.6 4.4 4.5 4.1  CL 97* 96* 100 104 100  CO2 '29 30 27 30 29  '$ GLUCOSE 130* 118* 113* 99 129*  BUN '16 18 13 13 17  '$ CREATININE 0.83 1.31* 1.33* 1.23* 1.14*  CALCIUM 9.3 9.3 9.2 9.0 8.9  MG 2.2  --   --   --   --    Liver Function Tests: Recent Labs  Lab 03/12/22 0456  AST 28  ALT 28  ALKPHOS 49  BILITOT 0.5  PROT 6.8  ALBUMIN 3.8    Discharge time spent: 35 minutes.  Signed: Cordelia Poche, MD [facility] 03/17/2022

## 2022-03-17 NOTE — Progress Notes (Signed)
[facility] Progress Note  SUBJECTIVE:   Interval history: Krystal [name] was seen and evaluated today at bedside.  Upon my entering room, patient was found to be tearful and in severe pain.  She was grasping her pelvic region and stating that she felt like she "was being stabbed in her urethra".  She noted having a history of interstitial cystitis.  She noted that nausea has improved.  She did have some vomiting today, she has attempted to eat.  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   PTSD (post-traumatic stress disorder)    Severe uncontrolled hypertension 10/08/2017   UTI (urinary tract infection)    Past Surgical History:  Procedure Laterality Date   APPENDECTOMY     BIOPSY  05/10/2020   Procedure: BIOPSY;  Surgeon: Arta Silence, MD;  Location: [facility];  Service: Endoscopy;;   CESAREAN SECTION N/A 12/06/2017   Procedure: CESAREAN SECTION;  Surgeon: Sloan Leiter, MD;  Location: [facility];  Service: Obstetrics;  Laterality: N/A;   CESAREAN SECTION  07/21/2021   Procedure: CESAREAN SECTION;  Surgeon: Donnamae Jude, MD;  Location: [facility];  Service: Obstetrics;;   CHOLECYSTECTOMY N/A 05/11/2020   Procedure: SINGLE SITE LAPAROSCOPIC CHOLECYSTECTOMY AND LIVER BIOSY;  Surgeon: Michael Boston, MD;  Location: [facility];  Service: General;  Laterality: N/A;   ESOPHAGOGASTRODUODENOSCOPY (EGD) WITH PROPOFOL N/A 05/10/2020   Procedure: ESOPHAGOGASTRODUODENOSCOPY (EGD) WITH PROPOFOL;  Surgeon: Arta Silence, MD;  Location: [facility];  Service: Endoscopy;  Laterality: N/A;   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   HAND RECONSTRUCTION Right    INCISION AND DRAINAGE     NASAL SEPTUM SURGERY     Current Facility-Administered Medications  Medication Dose Route Frequency Provider Last Rate Last Admin   0.9 %  sodium chloride infusion   Intravenous PRN Freddi Starr, MD   Stopped at 03/11/22 0501   albuterol (PROVENTIL) (2.5 MG/3ML) 0.083% nebulizer solution 2.5 mg  2.5 mg Nebulization Q4H PRN Jennelle Human B, NP       alum & mag hydroxide-simeth (MAALOX/MYLANTA) 200-200-20 MG/5ML suspension 30 mL  30 mL Oral Q6H PRN Jennelle Human B, NP   30 mL at 03/11/22 0310   amLODipine (NORVASC) tablet 10 mg  10 mg Oral Daily Jennelle Human B, NP   10 mg at 03/17/22 1040   bisacodyl (DULCOLAX) EC tablet 10 mg  10 mg Oral Daily PRN Jennelle Human B, NP       bisacodyl (DULCOLAX) suppository 10 mg  10 mg Rectal Daily PRN Freddi Starr, MD   10 mg at 03/09/22 1148   capsicum (ZOSTRIX) 0.075 % cream   Topical BID Arnell Asal, NP   Given at 03/14/22 1660   Chlorhexidine Gluconate Cloth 2 % PADS 6 each  6 each Topical Daily Mariel Aloe, MD   6 each at 03/17/22 1041   cloNIDine (CATAPRES - Dosed in mg/24 hr) patch 0.3 mg  0.3 mg Transdermal Weekly Jennelle Human B, NP   0.3 mg at 03/13/22 1743  DIPHENHYDRAMINE HCL '25MG'$  CAPSULE (DYE FREE)  25 mg Oral Q6H PRN Freddi Starr, MD   25 mg at 03/14/22 2094   erythromycin 250 mg in sodium chloride 0.9 % 100 mL IVPB  250 mg Intravenous Q6H Brahmbhatt, Parag, MD 100 mL/hr at 03/17/22 1258 250 mg at 03/17/22 1258   feeding supplement (BOOST / RESOURCE BREEZE) liquid 1 Container  1 Container Oral TID BM Mariel Aloe, MD   1 Container at 03/16/22 1949   gabapentin (NEURONTIN) capsule 300 mg  300 mg Oral BID Freda Jackson B, MD   300 mg at 03/17/22 1040   heparin injection 5,000 Units  5,000 Units Subcutaneous Q8H Jennelle Human B, NP   5,000 Units at 03/17/22 0524   hydrALAZINE (APRESOLINE) injection 10 mg  10  mg Intravenous Q4H PRN Anders Simmonds, MD   10 mg at 03/11/22 1705   HYDROmorphone (DILAUDID) injection 1 mg  1 mg Intravenous Q4H PRN Mariel Aloe, MD   1 mg at 03/17/22 0957   levothyroxine (SYNTHROID) tablet 125 mcg  125 mcg Oral Q0600 Jennelle Human B, NP   125 mcg at 03/17/22 0523   LORazepam (ATIVAN) tablet 1 mg  1 mg Oral TID Cinderella, Margaret A   1 mg at 03/17/22 1040   losartan (COZAAR) tablet 100 mg  100 mg Oral Daily Vernelle Emerald, MD   100 mg at 03/17/22 1040   lurasidone (LATUDA) tablet 20 mg  20 mg Oral QPC supper Maryjane Hurter, MD   20 mg at 03/16/22 1846   melatonin tablet 3 mg  3 mg Oral QHS PRN Mauri Brooklyn, MD   3 mg at 03/16/22 2135   ondansetron (ZOFRAN-ODT) disintegrating tablet 4 mg  4 mg Oral Q8H PRN Gleason, Otilio Carpen, PA-C   4 mg at 03/17/22 7096   Oral care mouth rinse  15 mL Mouth Rinse PRN Maryjane Hurter, MD       oxybutynin [facility]) tablet 5 mg  5 mg Oral Q8H PRN Vernelle Emerald, MD   5 mg at 03/16/22 2836   oxyCODONE (Oxy IR/ROXICODONE) immediate release tablet 10 mg  10 mg Oral TID PRN Gleason, Otilio Carpen, PA-C   10 mg at 03/17/22 1255   pantoprazole (PROTONIX) EC tablet 40 mg  40 mg Oral BID Karren Cobble, RPH   40 mg at 03/17/22 1040   polyethylene glycol (MIRALAX / GLYCOLAX) packet 17 g  17 g Oral BID Brahmbhatt, Parag, MD   17 g at 03/17/22 1042   promethazine (PHENERGAN) 25 mg in sodium chloride 0.9 % 50 mL IVPB  25 mg Intravenous Q6H PRN Gleason, Otilio Carpen, PA-C 200 mL/hr at 03/14/22 1247 25 mg at 03/14/22 1247   propranolol (INDERAL) tablet 20 mg  20 mg Oral BID Jennelle Human B, NP   20 mg at 03/17/22 1040   scopolamine (TRANSDERM-SCOP) 1 MG/3DAYS 1.5 mg  1 patch Transdermal Q72H Gleason, Otilio Carpen, PA-C   1.5 mg at 03/16/22 1846   sodium chloride flush (NS) 0.9 % injection 10-40 mL  10-40 mL Intracatheter PRN Mariel Aloe, MD       spironolactone (ALDACTONE) tablet 25 mg  25 mg Oral Daily Freda Jackson B, MD   25 mg at 03/17/22  1039   sucralfate (CARAFATE) 1 GM/10ML suspension 1 g  1 g Oral TID AC Brahmbhatt, Parag, MD   1 g at 03/17/22 1255   venlafaxine (EFFEXOR) tablet 75 mg  75 mg Oral  BID WC Cinderella, Margaret A   75 mg at 03/17/22 1300   Allergies as of 03/05/2022 - Review Complete 03/05/2022  Allergen Reaction Noted   Other Shortness Of Breath and Other (See Comments) 11/06/2015   Red dye Other (See Comments) 11/06/2015   Compazine [prochlorperazine maleate] Other (See Comments) 11/06/2015   Vicodin [hydrocodone-acetaminophen] Itching, Nausea And Vomiting, and Other (See Comments) 11/06/2015   Amitriptyline hcl Other (See Comments) 05/08/2020   Duloxetine hcl Other (See Comments) 05/08/2020   Lisinopril Cough 08/10/2021   Reglan [metoclopramide] Other (See Comments) 07/24/2017   Nifedipine Nausea And Vomiting and Other (See Comments) 09/07/2018   Review of Systems:  Review of Systems  Respiratory:  Negative for shortness of breath.   Cardiovascular:  Negative for chest pain.  Gastrointestinal:  Positive for abdominal pain (Suprapubic), nausea and vomiting.  Genitourinary:  Positive for dysuria.    OBJECTIVE:   Temp:  [97.8 F (36.6 C)-98.1 F (36.7 C)] 97.8 F (36.6 C) (01/22 0526) Pulse Rate:  [61-63] 61 (01/22 0526) Resp:  [14-16] 16 (01/22 0526) BP: (119-141)/(76-84) 119/84 (01/22 0526) SpO2:  [97 %-100 %] 100 % (01/22 0526) Weight:  [100.3 kg] 100.3 kg (01/22 0129) Last BM Date : 03/15/22 Physical Exam Constitutional:      General: She is in acute distress.     Appearance: She is not toxic-appearing or diaphoretic.  Cardiovascular:     Rate and Rhythm: Normal rate and regular rhythm.  Pulmonary:     Effort: No respiratory distress.     Breath sounds: Normal breath sounds.  Abdominal:     General: Bowel sounds are normal. There is no distension.     Palpations: Abdomen is soft.     Tenderness: There is abdominal tenderness. There is no guarding.  Skin:    General: Skin is warm  and dry.     Coloration: Skin is not jaundiced.  Neurological:     Mental Status: She is alert.     Labs: No results for input(s): "WBC", "HGB", "HCT", "PLT" in the last 72 hours. BMET Recent Labs    03/15/22 1100 03/16/22 0317 03/17/22 0240  NA 141 140 139  K 4.4 4.5 4.1  CL 100 104 100  CO2 '27 30 29  '$ GLUCOSE 113* 99 129*  BUN '13 13 17  '$ CREATININE 1.33* 1.23* 1.14*  CALCIUM 9.2 9.0 8.9   LFT No results for input(s): "PROT", "ALBUMIN", "AST", "ALT", "ALKPHOS", "BILITOT", "BILIDIR", "IBILI" in the last 72 hours. PT/INR No results for input(s): "LABPROT", "INR" in the last 72 hours. Diagnostic imaging: No results found.  IMPRESSION: Intractable nausea, etiology undetermined though suspect multifactorial -Possible gastroparesis, though patient unable to tolerate gastric emptying study -Has shown some improvement on erythromycin Suprapubic pain Marijuana use, may be contributing to nausea Constipation Anxiety and depression  PLAN: -[facility] consultation -Consider urology consultation given severity of her pain -Continue erythromycin, outpatient regimen can include erythromycin 250 mg p.o. 3 times daily, would require close outpatient GI follow-up to monitor this therapy, discussed with internal medicine -Continue MiraLAX -Monitor bowel movements -Agree with nutrition consultation for possible gastroparesis and dietary changes -No further acute workup from GI standpoint in the inpatient setting, able to discharge -Gastroenterology team will respectfully sign off and be available for any questions that arise   LOS: 12 days   Danton Clap, [facility]

## 2022-03-17 NOTE — Progress Notes (Signed)
RA DL PICC removed per protocol per MD order. Manual pressure applied for 5 mins. Vaseline gauze, gauze, and Tegaderm applied over insertion site. No bleeding or swelling noted. Instructed patient to remain in bed for thirty mins. Educated patient about S/S of infection and when to call MD; no heavy lifting or pressure on right side for 24 hours; keep dressing dry and intact for 24 hours. Pt verbalized comprehension. 
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
## 2022-03-17 NOTE — Progress Notes (Signed)
Patient was given discharge instructions, and all questions were answered. Patient was stable for discharge and was taken to the main exit by wheelchair. Patient was picked up by a family friend.

## 2022-03-21 ENCOUNTER — Telehealth (HOSPITAL_COMMUNITY): Payer: Medicaid Other | Admitting: Psychiatry

## 2022-03-21 ENCOUNTER — Encounter: Payer: Self-pay | Admitting: Cardiology

## 2022-03-21 ENCOUNTER — Telehealth: Payer: Self-pay | Admitting: Cardiology

## 2022-03-21 NOTE — Telephone Encounter (Signed)
Error

## 2022-03-21 NOTE — Telephone Encounter (Signed)
Pt is calling in to say she was recently discharged from the hospital with Hypertensive crisis and was discharged with the clonidine patch for her HTN, by the discharging MD.  Pt lost the clonidine patch that she picked up on 1/22 and cannot find it anywhere.  She has back tracked all the different places she could've misplaced this, and still cannot find this.   Called the Pharmacist at [facility] and explained this to her.  Per the Pharmacist at [facility], they were able to override this through [facility] and the pt can come back into the pharmacy to pick this up.  Pharmacist at [facility] will go ahead and refill this for the pt.   Called the pt and made her aware of this.  She will go to the Pharmacy now to pick this up.  Pt was very appreciative for all the assistance provided in getting this replaced for her.

## 2022-03-21 NOTE — Telephone Encounter (Signed)
Pt c/o medication issue:  1. Name of Medication: cloNIDine (CATAPRES - DOSED IN MG/24 HR) 0.3 mg/24hr patch   2. How are you currently taking this medication (dosage and times per day)?   3. Are you having a reaction (difficulty breathing--STAT)?   4. What is your medication issue? Pt was prescribed this medication by a different provider in the ED. She states that she went to pick up prescription, but they have told she has already filled this medication, but she states she never received or picked this up. She is requesting another RX be sent in for her.

## 2022-03-25 ENCOUNTER — Telehealth (HOSPITAL_BASED_OUTPATIENT_CLINIC_OR_DEPARTMENT_OTHER): Payer: Medicaid Other | Admitting: Psychiatry

## 2022-03-25 ENCOUNTER — Telehealth (HOSPITAL_COMMUNITY): Payer: Self-pay | Admitting: Psychiatry

## 2022-03-25 ENCOUNTER — Encounter (HOSPITAL_COMMUNITY): Payer: Self-pay | Admitting: Psychiatry

## 2022-03-25 DIAGNOSIS — F332 Major depressive disorder, recurrent severe without psychotic features: Secondary | ICD-10-CM | POA: Diagnosis not present

## 2022-03-25 DIAGNOSIS — F603 Borderline personality disorder: Secondary | ICD-10-CM

## 2022-03-25 DIAGNOSIS — F419 Anxiety disorder, unspecified: Secondary | ICD-10-CM | POA: Diagnosis not present

## 2022-03-25 DIAGNOSIS — M797 Fibromyalgia: Secondary | ICD-10-CM

## 2022-03-25 DIAGNOSIS — F4310 Post-traumatic stress disorder, unspecified: Secondary | ICD-10-CM

## 2022-03-25 MED ORDER — VENLAFAXINE HCL ER 37.5 MG PO CP24: 37.5000 mg | ORAL_CAPSULE | Freq: Every day | ORAL | 0 refills | Status: DC

## 2022-03-25 MED ORDER — LORAZEPAM 1 MG PO TABS: 1.0000 mg | ORAL_TABLET | Freq: Three times a day (TID) | ORAL | 0 refills | Status: DC | PRN
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
## 2022-03-25 NOTE — Telephone Encounter (Signed)
D:  Dr. Nelida Gores referred pt to [place].  A:  Placed call to confirm patient's insurance.  Patient states she has Medicaid.  Pt requested that the [place] Coordinator call her back in one hr b/c she was in the middle of doing something.  Coordinator will double check if Medicaid covers [place] or not and call pt later.  Inform Dr. Nelida Gores.

## 2022-03-26 ENCOUNTER — Encounter: Payer: Self-pay | Admitting: Cardiology

## 2022-03-26 ENCOUNTER — Telehealth (HOSPITAL_COMMUNITY): Payer: Self-pay | Admitting: Psychiatry

## 2022-03-26 NOTE — Telephone Encounter (Signed)
D:  Returned patient's call re: [employer], since pt had inquired.  Pt didn't answer, but [place] Coordinator left pt a vm with various insurance companies ( [address] [employer], [place], [employer]upe Guerra, [place], Garment/textile technologist) that previously authorized [employer] for patients.  Inform Dr. Nelida Gores.  Coordinator left her phone # just in case pt has further questions/concerns.

## 2022-03-27 ENCOUNTER — Telehealth: Payer: Self-pay | Admitting: Cardiology

## 2022-03-27 NOTE — Telephone Encounter (Signed)
Called the pt back.  She said she actually just got off the phone with Dr. Johney Frame.   Pt stated Dr. Johney Frame advised her to continue monitoring her pressures (currently 124/84).   Advised her to try to move around, wear her compressions, avoid salt, and elevate her extremities at rest.   Advised her to keep [place] posted as needed.  Highly advised her to keep her upcoming appt [facility] to see Dr. Oval Linsey on 2/14.  Pt verbalized understanding and agrees with this plan.  Pt was more than gracious for all the assistance provided.

## 2022-03-27 NOTE — Telephone Encounter (Signed)
Pt c/o swelling: STAT is pt has developed SOB within 24 hours  How much weight have you gained and in what time span? Not sure   If swelling, where is the swelling located? Some swelling in legs and feet  Are you currently taking a fluid pill? No   Are you currently SOB? No   Do you have a log of your daily weights (if so, list)? No   Have you gained 3 pounds in a day or 5 pounds in a week? Not sure   Have you traveled recently?  No.  Pt states that she sent MyChart message last night and states that she has SOB when she gets up and walks, but not when sitting.    BP 124/84

## 2022-03-31 ENCOUNTER — Telehealth (HOSPITAL_COMMUNITY): Payer: Medicaid Other | Admitting: Psychiatry

## 2022-04-08 ENCOUNTER — Encounter (HOSPITAL_COMMUNITY): Payer: Self-pay | Admitting: Psychiatry

## 2022-04-08 ENCOUNTER — Telehealth (HOSPITAL_BASED_OUTPATIENT_CLINIC_OR_DEPARTMENT_OTHER): Payer: No Payment, Other | Admitting: Psychiatry

## 2022-04-08 DIAGNOSIS — F332 Major depressive disorder, recurrent severe without psychotic features: Secondary | ICD-10-CM | POA: Diagnosis not present

## 2022-04-08 DIAGNOSIS — F419 Anxiety disorder, unspecified: Secondary | ICD-10-CM | POA: Diagnosis not present

## 2022-04-08 DIAGNOSIS — F603 Borderline personality disorder: Secondary | ICD-10-CM | POA: Diagnosis not present

## 2022-04-08 DIAGNOSIS — M797 Fibromyalgia: Secondary | ICD-10-CM

## 2022-04-08 DIAGNOSIS — F4310 Post-traumatic stress disorder, unspecified: Secondary | ICD-10-CM | POA: Diagnosis not present

## 2022-04-08 MED ORDER — CLONAZEPAM 1 MG PO TABS: 1.0000 mg | ORAL_TABLET | Freq: Three times a day (TID) | ORAL | 0 refills | Status: DC | PRN

## 2022-04-08 MED ORDER — LURASIDONE HCL 20 MG PO TABS: 20.0000 mg | ORAL_TABLET | Freq: Every day | ORAL | 1 refills | Status: DC

## 2022-04-08 MED ORDER — VENLAFAXINE HCL ER 37.5 MG PO CP24: 37.5000 mg | ORAL_CAPSULE | Freq: Every day | ORAL | 0 refills | Status: DC

## 2022-04-08 MED ORDER — FLUOXETINE HCL 10 MG PO CAPS: ORAL_CAPSULE | ORAL | 0 refills | Status: DC

## 2022-04-08 MED ORDER — PROPRANOLOL HCL 20 MG PO TABS: 20.0000 mg | ORAL_TABLET | Freq: Two times a day (BID) | ORAL | 1 refills | Status: DC | PRN
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
## 2022-04-09 ENCOUNTER — Encounter (HOSPITAL_BASED_OUTPATIENT_CLINIC_OR_DEPARTMENT_OTHER): Payer: Self-pay | Admitting: Cardiovascular Disease

## 2022-04-09 ENCOUNTER — Ambulatory Visit (INDEPENDENT_AMBULATORY_CARE_PROVIDER_SITE_OTHER): Payer: Self-pay | Admitting: Cardiovascular Disease

## 2022-04-09 VITALS — BP 162/96 | HR 69 | Ht 71.0 in | Wt 241.9 lb

## 2022-04-09 DIAGNOSIS — Z006 Encounter for examination for normal comparison and control in clinical research program: Secondary | ICD-10-CM

## 2022-04-09 DIAGNOSIS — I1A0 Resistant hypertension: Secondary | ICD-10-CM

## 2022-04-09 MED ORDER — VALSARTAN 320 MG PO TABS: 320.0000 mg | ORAL_TABLET | Freq: Every day | ORAL | 3 refills | Status: DC

## 2022-04-09 MED ORDER — CHLORTHALIDONE 25 MG PO TABS: 25.0000 mg | ORAL_TABLET | Freq: Every day | ORAL | 3 refills | Status: DC

## 2022-04-09 NOTE — Progress Notes (Signed)
[facility] Initial Assessment:    Date:  04/09/2022   ID:  Krystal [name], DOB 10/22/1990, MRN [id number]  PCP:  Patient, No Pcp Per  Cardiologist:  Freada Bergeron, MD  Nephrologist:  Referring MD: Freada Bergeron, MD   CC: Hypertension  History of Present Illness:    Krystal [name] is a 32 y.o. female with a hx of hypertension, HELLP syndrome, asthma, interstitial cystitis, nephrolithiasis, PTSD, THC use, necrotizing fasciitis s/p left hand amputation, and cholecystitis s/p cholecystectomy 04/2020, here to establish care in the [facility]. She has struggled with hypertension since her teens, and has a history of HELLP syndrome and pre-eclampsia in all 3 of her pregnancies. Per Dr. Jacolyn Reedy notes, "She was followed throughout her third pregnancy for difficult to control HTN and persistent nausea and vomiting making it hard to to tolerate PO medications. She ultimately required hospitalization on 06/2021 for severe hypertension, anisocaria and HA. CT/MRI were negative for acute findings. She had a prolonged hospitalization complicated by persistent HTN, nausea/vomiting requiring NGT placement, severe anxiety/depression, and HELLP syndrome. She underwent emergent c-section with bilateral salpingectomy on 07/21/21."   She saw Dr. Johney Frame 09/19/2021 and reported blood pressures as high as 200s/120s on 10 mg amlodipine, 100 mg hydralazine TID, and 400 mg labetalol BID (intolerant to higher dose due to nausea and vomiting). High blood pressures were associated with headache and blurred vision. Since she was no longer pregnant at the time, so more antihypertensive options were available. Labetalol was changed to 25 mg Coreg BID, and she was started on 25 mg spironolactone daily. She was referred to the [facility] for work-up of secondary causes of hypertension.   She was last seen by Dr. Johney Frame 03/05/2022 and her BP was  200/121 in the office. Home readings were greater than 220s/130s. She had constant headaches and chest pains. She was unable to tolerate PO medications due to frequent vomiting. Given continued right eye vision loss, HA, blood pressures >200/120s at home, we recommended she go to the ER for further management. She was admitted to the ICU for Cardene IV infusion hypertension. Hospitalization was complicated by intractable nausea/vomiting which responded to erythromycin. Discharged on clonidine patch, losartan, amlodipine and spironolactone. Hydralazine discontinued on discharge. She had a CT of the abdomen/pelvis 02/2022 that had no adrenal adenomas and no evidence of renal artery stenosis.  Today, she is not feeling very good. She has been feeling constantly exhausted. Initially she was diagnosed with hypertension when she was 32 yo. Her blood pressure has been very labile. She notes if her blood pressure rises again she was advised to go to the hospital due to concern for further ruptured vessels in her eyes. She has lost vision in her right eye. In clinic today her blood pressure is 154/82 (162/96 on recheck). She is tolerating her clonidine patch well. Of note, she was taken off of carvedilol as it was triggering her asthma after 30 minutes of taking it. Labetalol was stopped as it caused nausea. At this time she continues to struggle with nausea and vomiting. She is not always able to keep her medications down. 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 Generally she tries to exercise. This is difficult given her depression, anxiety, and caring for her 3 daughters. Today she now has a home nurse that will assist with caring for her baby 5 days a week. Concerning her diet, she cooks her meals more frequently when  she is feeling well; if her mood has worsened she orders out more often. She has considered switching to a more plant-based diet, but she is doubtful of being able to commit. During the day  she usually has 1 cup of coffee, and a mushroom coffee with minimal caffeine in the afternoon. Alcohol consumption is limited to about 1 glass per week. No smoking history. When she is very stressed, she may use Delta 8 gummies. Lately this is about twice a week. She endorses snoring. About 1-2 years ago she had a sleep study and was told that if she does have sleep apnea it is very mild. She denies any palpitations, chest pain, shortness of breath, or peripheral edema. No lightheadedness, headaches, syncope, orthopnea, or PND.  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   Severe uncontrolled hypertension 10/08/2017   UTI (urinary tract infection)     Past Surgical History:  Procedure Laterality Date   APPENDECTOMY     BIOPSY  05/10/2020   Procedure: BIOPSY;  Surgeon: Arta Silence, MD;  Location: [facility];  Service: Endoscopy;;   CESAREAN SECTION N/A 12/06/2017   Procedure: CESAREAN SECTION;  Surgeon: Sloan Leiter, MD;  Location: [facility];  Service: Obstetrics;  Laterality: N/A;   CESAREAN SECTION  07/21/2021   Procedure: CESAREAN SECTION;  Surgeon: Donnamae Jude, MD;  Location: [facility];  Service: Obstetrics;;   CHOLECYSTECTOMY N/A 05/11/2020   Procedure: SINGLE SITE LAPAROSCOPIC CHOLECYSTECTOMY AND LIVER BIOSY;  Surgeon: Michael Boston, MD;  Location: [facility];  Service: General;  Laterality: N/A;    ESOPHAGOGASTRODUODENOSCOPY (EGD) WITH PROPOFOL N/A 05/10/2020   Procedure: ESOPHAGOGASTRODUODENOSCOPY (EGD) WITH PROPOFOL;  Surgeon: Arta Silence, MD;  Location: [facility];  Service: Endoscopy;  Laterality: N/A;   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   valsartan (DIOVAN) 320 MG tablet Take 1 tablet (320 mg total) by mouth daily.     Allergies:   Coreg [carvedilol], Other, Red dye, Compazine [prochlorperazine maleate], Vicodin [hydrocodone-acetaminophen], Amitriptyline hcl, Duloxetine hcl, Lisinopril, Naproxen, Reglan [metoclopramide], and Nifedipine   Social History   Socioeconomic History   Marital status: Married    Spouse name: Not on file   Number of children: 3   Years of education: Not on file   Highest education level: 8th grade  Occupational History   Not on file  Tobacco Use   Smoking status: Never   Smokeless tobacco: Never   Tobacco comments:    She tried a cigarette and never smoked again  Vaping Use   Vaping Use: Never used  Substance and Sexual Activity   Alcohol use: Yes    Comment: bottle of  wine a week-occ   Drug use: No   Sexual activity: Not Currently  Other Topics Concern   Not on file  Social History Narrative   Not on file   Social Determinants of Health   Financial Resource Strain: Not on file  Food Insecurity: Food Insecurity Present (03/12/2022)   Hunger Vital Sign    Worried About Running Out of Food in the Last Year: Often true    Ran Out of Food in the Last Year: Often true  Transportation Needs: No Transportation Needs (03/12/2022)   PRAPARE - Hydrologist (Medical): No    Lack of Transportation (Non-Medical): No  Recent Concern: Transportation Needs - Unmet Transportation  Needs (03/12/2022)   PRAPARE - Transportation    Lack of Transportation (Medical): Yes    Lack of Transportation (Non-Medical): Yes  Physical Activity: Inactive (04/09/2022)   Exercise Vital Sign    Days of Exercise per Week: 0 days    Minutes of Exercise per Session: 0 min  Stress: Not on file  Social Connections: Not on file     Family History: The patient's family history includes Atrial fibrillation in her father; Coronary artery disease in her mother; Heart attack in her maternal grandfather; Heart failure in her maternal grandfather; Hypertension in her father and mother; Stroke in her father.  ROS:   Please see the history of present illness.    (+) Nausea (+) Vomiting (+) Stress/anxiety (+) Snoring (+) Fatigue All other systems reviewed and are negative.  EKGs/Labs/Other Studies Reviewed:    Echo  06/17/2021: Sonographer Comments: Cardio-OB Echo   IMPRESSIONS   1. Global longitudinal strain is -20%. Left ventricular ejection  fraction, by estimation, is 60 to 65%. The left ventricle has normal  function. The left ventricle has no regional wall motion abnormalities.  There is mild left ventricular hypertrophy.  Left ventricular diastolic parameters were normal.   2. Right ventricular systolic function is normal. The right ventricular  size is normal.   3. Mild mitral valve regurgitation.   4. The aortic valve is tricuspid. Aortic valve regurgitation is not  visualized. Aortic valve sclerosis is present, with no evidence of aortic  valve stenosis.   5. The inferior vena cava is normal in size with greater than 50%  respiratory variability, suggesting right atrial pressure of 3 mmHg.   EKG:  EKG is personally reviewed. 04/09/2022: EKG was not ordered.  Recent Labs: 06/10/2021: NT-Pro BNP 165 06/27/2021: B Natriuretic Peptide 28.9 03/06/2022: TSH 5.404 03/12/2022: ALT 28; Hemoglobin 14.6; Magnesium 2.2; Platelets 275 03/17/2022: BUN 17; Creatinine, Ser 1.14; Potassium  4.1; Sodium 139   Recent Lipid Panel No results found for: "CHOL", "TRIG", "HDL", "CHOLHDL", "VLDL", "LDLCALC", "LDLDIRECT"  Physical Exam:    VS:  BP (!) 162/96 (BP Location: Left Arm, Patient Position: Sitting, Cuff Size: Large)   Pulse 69   Ht 5' 11"$  (1.803 m)   Wt 241 lb 14.4 oz (109.7 kg)   SpO2 99%   BMI 33.74 kg/m  , BMI Body mass index is 33.74 kg/m. GENERAL:  Well appearing HEENT: Pupils equal round and reactive, fundi not visualized, oral mucosa unremarkable NECK:  No jugular venous distention, waveform within normal limits, carotid upstroke brisk and symmetric, no bruits, no thyromegaly LUNGS:  Clear to auscultation bilaterally HEART:  RRR.  PMI not displaced or sustained,S1 and S2 within normal limits, no S3, no S4, no clicks, no rubs, no murmurs ABD:  Flat, positive bowel sounds normal in  frequency in pitch, no bruits, no rebound, no guarding, no midline pulsatile mass, no hepatomegaly, no splenomegaly EXT:  2 plus pulses throughout, no edema, no cyanosis, no clubbing. L hand amputation SKIN:  No rashes, no nodules NEURO:  Cranial nerves II through XII grossly intact, motor grossly intact throughout PSYCH:  Cognitively intact, oriented to person place and time   ASSESSMENT/PLAN:    Resistant hypertension Krystal [name] struggling with uncontrolled hypertension since age 30.  She does not think that you are had an evaluation for secondary causes.  Blood pressures have been uncontrolled both at home and in the office.  We will switch losartan to valsartan 320 mg daily.  Will also add chlorthalidone 25 mg daily.  She had photosensitivity with HCTZ in the past.  In a week we will check a BMP.  Continue clonidine patch, propranolol, spironolactone, and amlodipine.  There are some concerns about compliance based on the timing of her pharmacy refills, though she does report taking her medications as prescribed.  She also struggles with nausea and vomiting and is not always able to  keep her medications down.  Anxiety and depression have also been challenging.  She is under a lot of life stressors.  She has 3 young children and her youngest recently had repair of tetralogy of Fallot at [facility].  Fortunately, she does not have a nurse for the baby who will be coming to the home 5 days/week.  This will free her to have more time to take care of herself.  Will refer her to the PREP program at the [facility].  She and her husband have been considering transitioning to a vegan lifestyle.  While I do completely suggest this.  She seems to be otherwise overwhelmed and this may be a hard time to make such a drastic change.  Recommended that she focus on cooking more meals at home at least 2 to 3 days/week and limiting her sodium intake.  She was given information about the DASH diet and sodium goals.  We will check renal artery Dopplers, renin, aldosterone, and repeat TSH levels.  TSH was mildly elevated.  Will check T3 and T4 as well.  Additional considerations in the future could consider switching propranolol to nebivolol.  She did not tolerate labetalol or carvedilol in the past.  Her dose is spironolactone could also be increased.  Continue working with her psychiatrist on her life stressors and psychiatric illnesses.  She is very eager to make positive life changes and was congratulated on her efforts and plans.   Time spent: 45 minutes-Greater than 50% of this time was spent in counseling, explanation of diagnosis, planning of further management, and coordination of care.   Screening for Secondary Hypertension:     04/09/2022    2:39 PM  Causes  Drugs/Herbals Screened     - Comments 1 coffee daily.occasional EtOH. No tob. delta 8 gummies.  Renovascular HTN Screened     - Comments abd CT negative for renal stenosis 02/2022  Sleep Apnea Screened  Thyroid Disease Screened     - Comments TSH high.  Repeat T3 and fT4.  Hyperaldosteronism Screened     - Comments no adenomas.  Check renin/aldo     Relevant Labs/Studies:    Latest Ref Rng & Units 03/17/2022    2:40 AM 03/16/2022    3:17 AM 03/15/2022   11:00 AM  Basic Labs  Sodium 135 - 145 mmol/L 139  140  141   Potassium 3.5 - 5.1  mmol/L 4.1  4.5  4.4   Creatinine 0.44 - 1.00 mg/dL 1.14  1.23  1.33        Latest Ref Rng & Units 03/06/2022    3:28 AM 07/03/2021   12:28 PM  Thyroid   TSH 0.350 - 4.500 uIU/mL 5.404  2.154    Disposition:    FU with APP/PharmD in 1 month for the next 3 months.   FU with Krystal Brackney C. Oval Linsey, MD, [facility] in 4 months.  Medication Adjustments/Labs and Tests Ordered: Current medicines are reviewed at length with the patient today.  Concerns regarding medicines are outlined above.   Orders Placed This Encounter  Procedures   Aldosterone + renin activity w/ ratio   T4, free   TSH   Basic metabolic panel   T3   Amb Referral To Provider Referral Exercise Program (P.R.E.P)   Cantril's Ladder Assessment   VAS US RENAL ARTERY DUPLEX   Meds ordered this encounter  Medications   valsartan (DIOVAN) 320 MG tablet    Sig: Take 1 tablet (320 mg total) by mouth daily.    Dispense:  90 tablet    Refill:  3    D/C LOSARTAN   chlorthalidone (HYGROTON) 25 MG tablet    Sig: Take 1 tablet (25 mg total) by mouth daily.    Dispense:  90 tablet    Refill:  3   I,Mathew Stumpf,acting as a scribe for Skeet Latch, MD.,have documented all relevant documentation on the behalf of Skeet Latch, MD,as directed by  Skeet Latch, MD while in the presence of Skeet Latch, MD.  I, [place] Oval Linsey, MD have reviewed all documentation for this visit.  The documentation of the exam, diagnosis, procedures, and orders on 04/09/2022 are all accurate and complete.   Signed, Skeet Latch, MD  04/09/2022 3:31 PM    [facility]

## 2022-04-09 NOTE — Patient Instructions (Signed)
Medication Instructions:  STOP LOSARTAN   START VALSARTAN 320 MG DAILY   START CHLORTHALIDONE 25 MG DAILY    Labwork: BMET/RENIN/ALDOSTERONE/TSH/FT4/T3 IN 1 WEEK    Testing/Procedures: Your physician has requested that you have a renal artery duplex. During this test, an ultrasound is used to evaluate blood flow to the kidneys. Allow one hour for this exam. Do not eat after midnight the day before and avoid carbonated beverages. Take your medications as you usually do.   Follow-Up: 05/13/2022 11:00 AM WITH PHARM D    You will receive a phone call from the PREP exercise and nutrition program to schedule an initial assessment.   Special Instructions:  MONITOR YOUR BLOOD PRESSURE TWICE A DAY WITH MACHINE PROVIDED, LOG IN BOOK AND BRING TO FOLLOW UP   DASH Eating Plan DASH stands for "Dietary Approaches to Stop Hypertension." The DASH eating plan is a healthy eating plan that has been shown to reduce high blood pressure (hypertension). It may also reduce your risk for type 2 diabetes, heart disease, and stroke. The DASH eating plan may also help with weight loss. What are tips for following this plan?  General guidelines Avoid eating more than 2,300 mg (milligrams) of salt (sodium) a day. If you have hypertension, you may need to reduce your sodium intake to 1,500 mg a day. Limit alcohol intake to no more than 1 drink a day for nonpregnant women and 2 drinks a day for men. One drink equals 12 oz of beer, 5 oz of wine, or 1 oz of hard liquor. Work with your health care provider to maintain a healthy body weight or to lose weight. Ask what an ideal weight is for you. Get at least 30 minutes of exercise that causes your heart to beat faster (aerobic exercise) most days of the week. Activities may include walking, swimming, or biking. Work with your health care provider or diet and nutrition specialist (dietitian) to adjust your eating plan to your individual calorie needs. Reading food  labels  Check food labels for the amount of sodium per serving. Choose foods with less than 5 percent of the Daily Value of sodium. Generally, foods with less than 300 mg of sodium per serving fit into this eating plan. To find whole grains, look for the word "whole" as the first word in the ingredient list. Shopping Buy products labeled as "low-sodium" or "no salt added." Buy fresh foods. Avoid canned foods and premade or frozen meals. Cooking Avoid adding salt when cooking. Use salt-free seasonings or herbs instead of table salt or sea salt. Check with your health care provider or pharmacist before using salt substitutes. Do not fry foods. Cook foods using healthy methods such as baking, boiling, grilling, and broiling instead. Cook with heart-healthy oils, such as olive, canola, soybean, or sunflower oil. Meal planning Eat a balanced diet that includes: 5 or more servings of fruits and vegetables each day. At each meal, try to fill half of your plate with fruits and vegetables. Up to 6-8 servings of whole grains each day. Less than 6 oz of lean meat, poultry, or fish each day. A 3-oz serving of meat is about the same size as a deck of cards. One egg equals 1 oz. 2 servings of low-fat dairy each day. A serving of nuts, seeds, or beans 5 times each week. Heart-healthy fats. Healthy fats called Omega-3 fatty acids are found in foods such as flaxseeds and coldwater fish, like sardines, salmon, and mackerel. Limit how much you  eat of the following: Canned or prepackaged foods. Food that is high in trans fat, such as fried foods. Food that is high in saturated fat, such as fatty meat. Sweets, desserts, sugary drinks, and other foods with added sugar. Full-fat dairy products. Do not salt foods before eating. Try to eat at least 2 vegetarian meals each week. Eat more home-cooked food and less restaurant, buffet, and fast food. When eating at a restaurant, ask that your food be prepared with  less salt or no salt, if possible. What foods are recommended? The items listed may not be a complete list. Talk with your dietitian about what dietary choices are best for you. Grains Whole-grain or whole-wheat bread. Whole-grain or whole-wheat pasta. Brown rice. [place]. Bulgur. Whole-grain and low-sodium cereals. Pita bread. Low-fat, low-sodium crackers. Whole-wheat flour tortillas. Vegetables Fresh or frozen vegetables (raw, steamed, roasted, or grilled). Low-sodium or reduced-sodium tomato and vegetable juice. Low-sodium or reduced-sodium tomato sauce and tomato paste. Low-sodium or reduced-sodium canned vegetables. Fruits All fresh, dried, or frozen fruit. Canned fruit in natural juice (without added sugar). Meat and other protein foods Skinless chicken or [place]. Ground chicken or [place]. Pork with fat trimmed off. Fish and seafood. Egg whites. Dried beans, peas, or lentils. Unsalted nuts, nut butters, and seeds. Unsalted canned beans. Lean cuts of beef with fat trimmed off. Low-sodium, lean deli meat. Dairy Low-fat (1%) or fat-free (skim) milk. Fat-free, low-fat, or reduced-fat cheeses. Nonfat, low-sodium ricotta or cottage cheese. Low-fat or nonfat yogurt. Low-fat, low-sodium cheese. Fats and oils Soft margarine without trans fats. Vegetable oil. Low-fat, reduced-fat, or light mayonnaise and salad dressings (reduced-sodium). Canola, safflower, olive, soybean, and sunflower oils. Avocado. Seasoning and other foods Herbs. Spices. Seasoning mixes without salt. Unsalted popcorn and pretzels. Fat-free sweets. What foods are not recommended? The items listed may not be a complete list. Talk with your dietitian about what dietary choices are best for you. Grains Baked goods made with fat, such as croissants, muffins, or some breads. Dry pasta or rice meal packs. Vegetables Creamed or fried vegetables. Vegetables in a cheese sauce. Regular canned vegetables (not low-sodium or  reduced-sodium). Regular canned tomato sauce and paste (not low-sodium or reduced-sodium). Regular tomato and vegetable juice (not low-sodium or reduced-sodium). Angie Fava. Olives. Fruits Canned fruit in a light or heavy syrup. Fried fruit. Fruit in cream or butter sauce. Meat and other protein foods Fatty cuts of meat. Ribs. Fried meat. Berniece Salines. Sausage. Bologna and other processed lunch meats. Salami. Fatback. Hotdogs. Bratwurst. Salted nuts and seeds. Canned beans with added salt. Canned or smoked fish. Whole eggs or egg yolks. Chicken or [place] with skin. Dairy Whole or 2% milk, cream, and half-and-half. Whole or full-fat cream cheese. Whole-fat or sweetened yogurt. Full-fat cheese. Nondairy creamers. Whipped toppings. Processed cheese and cheese spreads. Fats and oils Butter. Stick margarine. Lard. Shortening. Ghee. Bacon fat. Tropical oils, such as coconut, palm kernel, or palm oil. Seasoning and other foods Salted popcorn and pretzels. Onion salt, garlic salt, seasoned salt, table salt, and sea salt. Worcestershire sauce. Tartar sauce. Barbecue sauce. Teriyaki sauce. Soy sauce, including reduced-sodium. Steak sauce. Canned and packaged gravies. Fish sauce. Oyster sauce. Cocktail sauce. Horseradish that you find on the shelf. Ketchup. Mustard. Meat flavorings and tenderizers. Bouillon cubes. Hot sauce and Tabasco sauce. Premade or packaged marinades. Premade or packaged taco seasonings. Relishes. Regular salad dressings. Where to find more information: National Heart, Lung, and [place]: https://wilson-eaton.com/ American Heart Association: www.heart.org Summary The DASH eating plan is a healthy  eating plan that has been shown to reduce high blood pressure (hypertension). It may also reduce your risk for type 2 diabetes, heart disease, and stroke. With the DASH eating plan, you should limit salt (sodium) intake to 2,300 mg a day. If you have hypertension, you may need to reduce your sodium intake to  1,500 mg a day. When on the DASH eating plan, aim to eat more fresh fruits and vegetables, whole grains, lean proteins, low-fat dairy, and heart-healthy fats. Work with your health care provider or diet and nutrition specialist (dietitian) to adjust your eating plan to your individual calorie needs. This information is not intended to replace advice given to you by your health care provider. Make sure you discuss any questions you have with your health care provider. Document Released: 01/30/2011 Document Revised: 01/23/2017 Document Reviewed: 02/04/2016 Elsevier Patient Education  2020 [employer].

## 2022-04-09 NOTE — Assessment & Plan Note (Addendum)
Krystal [name] struggling with uncontrolled hypertension since age 32.  She does not think that you are had an evaluation for secondary causes.  Blood pressures have been uncontrolled both at home and in the office.  We will switch losartan to valsartan 320 mg daily.  Will also add chlorthalidone 25 mg daily.  She had photosensitivity with HCTZ in the past.  In a week we will check a BMP.  Continue clonidine patch, propranolol, spironolactone, and amlodipine.  There are some concerns about compliance based on the timing of her pharmacy refills, though she does report taking her medications as prescribed.  She also struggles with nausea and vomiting and is not always able to keep her medications down.  Anxiety and depression have also been challenging.  She is under a lot of life stressors.  She has 3 young children and her youngest recently had repair of tetralogy of Fallot at [facility].  Fortunately, she does not have a nurse for the baby who will be coming to the home 5 days/week.  This will free her to have more time to take care of herself.  Will refer her to the PREP program at the [facility].  She and her husband have been considering transitioning to a vegan lifestyle.  While I do completely suggest this.  She seems to be otherwise overwhelmed and this may be a hard time to make such a drastic change.  Recommended that she focus on cooking more meals at home at least 2 to 3 days/week and limiting her sodium intake.  She was given information about the DASH diet and sodium goals.  We will check renal artery Dopplers, renin, aldosterone, and repeat TSH levels.  TSH was mildly elevated.  Will check T3 and T4 as well.  Additional considerations in the future could consider switching propranolol to nebivolol.  She did not tolerate labetalol or carvedilol in the past.  Her dose is spironolactone could also be increased.  Continue working with her psychiatrist on her life stressors and psychiatric illnesses.  She is very eager  to make positive life changes and was congratulated on her efforts and plans.

## 2022-04-09 NOTE — Research (Addendum)
  Subject Name: Krystal [name]  Loran C Prather met inclusion and exclusion criteria for the Virtual Care and Social Determinant Interventions for the management of hypertension trial.  The informed consent form, study requirements and expectations were reviewed with the subject by Dr. Oval Linsey and myself. The subject was given the opportunity to read the consent and ask questions. The subject verbalized understanding of the trial requirements.  All questions were addressed prior to the signing of the consent form. The subject agreed to participate in the trial and signed the informed consent. The informed consent was obtained prior to performance of any protocol-specific procedures for the subject.  A copy of the signed informed consent was given to the subject and a copy was placed in the subject's medical record.  Jayanna C Such was randomized to Group 1.

## 2022-04-12 ENCOUNTER — Emergency Department (HOSPITAL_BASED_OUTPATIENT_CLINIC_OR_DEPARTMENT_OTHER): Payer: Self-pay

## 2022-04-12 ENCOUNTER — Encounter (HOSPITAL_BASED_OUTPATIENT_CLINIC_OR_DEPARTMENT_OTHER): Payer: Self-pay

## 2022-04-12 ENCOUNTER — Emergency Department (HOSPITAL_BASED_OUTPATIENT_CLINIC_OR_DEPARTMENT_OTHER)
Admission: EM | Admit: 2022-04-12 | Discharge: 2022-04-12 | Disposition: A | Discharge status: Home / Self Care | Payer: Self-pay | Attending: Emergency Medicine | Admitting: Emergency Medicine

## 2022-04-12 ENCOUNTER — Other Ambulatory Visit: Payer: Self-pay

## 2022-04-12 DIAGNOSIS — N3010 Interstitial cystitis (chronic) without hematuria: Secondary | ICD-10-CM | POA: Insufficient documentation

## 2022-04-12 DIAGNOSIS — I10 Essential (primary) hypertension: Secondary | ICD-10-CM | POA: Insufficient documentation

## 2022-04-12 DIAGNOSIS — Z79899 Other long term (current) drug therapy: Secondary | ICD-10-CM | POA: Insufficient documentation

## 2022-04-12 DIAGNOSIS — N83202 Unspecified ovarian cyst, left side: Secondary | ICD-10-CM | POA: Insufficient documentation

## 2022-04-12 LAB — COMPREHENSIVE METABOLIC PANEL
ALT: 17 U/L (ref 0–44)
AST: 21 U/L (ref 15–41)
Albumin: 4.6 g/dL (ref 3.5–5.0)
Alkaline Phosphatase: 49 U/L (ref 38–126)
Anion gap: 8 (ref 5–15)
BUN: 14 mg/dL (ref 6–20)
CO2: 25 mmol/L (ref 22–32)
Calcium: 10.1 mg/dL (ref 8.9–10.3)
Chloride: 104 mmol/L (ref 98–111)
Creatinine, Ser: 0.96 mg/dL (ref 0.44–1.00)
GFR, Estimated: >60 mL/min (ref >60)
Glucose, Bld: 87 mg/dL (ref 70–99)
Potassium: 4.6 mmol/L (ref 3.5–5.1)
Sodium: 137 mmol/L (ref 135–145)
Total Bilirubin: 0.5 mg/dL (ref 0.3–1.2)
Total Protein: 7.3 g/dL (ref 6.5–8.1)

## 2022-04-12 LAB — CBC WITH DIFFERENTIAL/PLATELET
Abs Immature Granulocytes: 0.01 10*3/uL (ref 0.00–0.07)
Basophils Absolute: 0 10*3/uL (ref 0.0–0.1)
Basophils Relative: 0 %
Eosinophils Absolute: 0 10*3/uL (ref 0.0–0.5)
Eosinophils Relative: 0 %
HCT: 40.5 % (ref 36.0–46.0)
Hemoglobin: 14.3 g/dL (ref 12.0–15.0)
Immature Granulocytes: 0 %
Lymphocytes Relative: 39 %
Lymphs Abs: 2.6 10*3/uL (ref 0.7–4.0)
MCH: 32.4 pg (ref 26.0–34.0)
MCHC: 35.3 g/dL (ref 30.0–36.0)
MCV: 91.6 fL (ref 80.0–100.0)
Monocytes Absolute: 0.7 10*3/uL (ref 0.1–1.0)
Monocytes Relative: 10 %
Neutro Abs: 3.3 10*3/uL (ref 1.7–7.7)
Neutrophils Relative %: 51 %
Platelets: 294 10*3/uL (ref 150–400)
RBC: 4.42 MIL/uL (ref 3.87–5.11)
RDW: 11.9 % (ref 11.5–15.5)
WBC: 6.6 10*3/uL (ref 4.0–10.5)
nRBC: 0 % (ref 0.0–0.2)

## 2022-04-12 LAB — URINALYSIS, ROUTINE W REFLEX MICROSCOPIC
Bacteria, UA: NONE SEEN
Bilirubin Urine: NEGATIVE
Glucose, UA: NEGATIVE mg/dL
Ketones, ur: NEGATIVE mg/dL
Leukocytes,Ua: NEGATIVE
Nitrite: NEGATIVE
Protein, ur: NEGATIVE mg/dL
RBC / HPF: >50 RBC/hpf (ref 0–5)
Specific Gravity, Urine: 1.008 (ref 1.005–1.030)
pH: 5 (ref 5.0–8.0)

## 2022-04-12 LAB — PREGNANCY, URINE: Preg Test, Ur: NEGATIVE

## 2022-04-12 MED ORDER — HYDROMORPHONE HCL 1 MG/ML IJ SOLN
1.0000 mg | Freq: Once | INTRAMUSCULAR | Status: AC
  Administered 2022-04-12: 1 mg via INTRAVENOUS
  Filled 2022-04-12: qty 1

## 2022-04-12 MED ORDER — HYDROCODONE-ACETAMINOPHEN 5-325 MG PO TABS: 1.0000 | ORAL_TABLET | ORAL | 0 refills | Status: DC | PRN

## 2022-04-12 MED ORDER — KETOROLAC TROMETHAMINE 15 MG/ML IJ SOLN
15.0000 mg | Freq: Once | INTRAMUSCULAR | Status: AC
  Administered 2022-04-12: 15 mg via INTRAVENOUS
  Filled 2022-04-12: qty 1

## 2022-04-12 MED ORDER — SODIUM CHLORIDE 0.9 % IV BOLUS
1000.0000 mL | Freq: Once | INTRAVENOUS | Status: AC
  Administered 2022-04-12: 1000 mL via INTRAVENOUS

## 2022-04-12 MED ORDER — DIPHENHYDRAMINE HCL 50 MG/ML IJ SOLN
25.0000 mg | Freq: Once | INTRAMUSCULAR | Status: AC
  Administered 2022-04-12: 25 mg via INTRAVENOUS
  Filled 2022-04-12: qty 1

## 2022-04-12 MED ORDER — DIPHENHYDRAMINE HCL 25 MG PO CAPS
25.0000 mg | ORAL_CAPSULE | Freq: Once | ORAL | Status: DC
  Filled 2022-04-12: qty 1

## 2022-04-12 MED ORDER — OXYCODONE HCL 5 MG PO TABS: 5.0000 mg | ORAL_TABLET | ORAL | 0 refills | Status: DC | PRN

## 2022-04-12 MED ORDER — BELLADONNA ALKALOIDS-OPIUM 16.2-60 MG RE SUPP: 1.0000 | RECTAL | 0 refills | Status: DC | PRN

## 2022-04-12 MED ORDER — HYDRALAZINE HCL 20 MG/ML IJ SOLN
20.0000 mg | Freq: Once | INTRAMUSCULAR | Status: AC
  Administered 2022-04-12: 20 mg via INTRAVENOUS
  Filled 2022-04-12: qty 1

## 2022-04-12 MED ORDER — ONDANSETRON HCL 4 MG/2ML IJ SOLN
4.0000 mg | Freq: Once | INTRAMUSCULAR | Status: AC
  Administered 2022-04-12: 4 mg via INTRAVENOUS
  Filled 2022-04-12: qty 2

## 2022-04-12 MED ORDER — FENTANYL CITRATE PF 50 MCG/ML IJ SOSY
50.0000 ug | PREFILLED_SYRINGE | Freq: Once | INTRAMUSCULAR | Status: AC
  Administered 2022-04-12: 50 ug via INTRAVENOUS
  Filled 2022-04-12: qty 1

## 2022-04-12 NOTE — ED Notes (Signed)
Pt reports itching around wrist band and where pants are touching skin. No hives noted, no difficulty breathing noted. PA notified of same.

## 2022-04-12 NOTE — ED Triage Notes (Signed)
In for eval of stabbing lower abd pain onset for 3 days. Nausea and vomiting. Denies diarrhea. Irregular menstrual cycle. Elevated BP due to vomiting medications. Pain feels like stabbing pain up urethra into bladder. Denies dysuria. Lower back pain intermittent.

## 2022-04-12 NOTE — Discharge Instructions (Signed)
You have a cyst on your left ovary. Please follow up with your GYN for recheck. Pain likely from IC, try the B&O suppository as prescribed. If not improving, can try Norco and follow up with your provider.

## 2022-04-12 NOTE — ED Provider Notes (Signed)
[facility] Provider Note   CSN: [id number] Arrival date & time: 04/12/22  1203     History  Chief Complaint  Patient presents with   Abdominal Pain    Krystal [name] is a 32 y.o. female.  32 year old female with past medical history of kidney stones, interstitial cystitis, hypertension, additional history per chart presents with complaint of a stabbing pain in her bladder and urethra for the past 3 days associated with nausea and vomiting.  Reports irregular menstrual cycles, denies changes in bowel habits.  Reports urinary frequency which she relates to her IC, denies dysuria or hematuria.       Home Medications Prior to Admission medications   Medication Sig Start Date End Date Taking? Authorizing Provider  albuterol (VENTOLIN HFA) 108 (90 Base) MCG/ACT inhaler Inhale 2 puffs into the lungs every 4 (four) hours as needed for wheezing or shortness of breath (coughing fits). 12/04/21  Yes Garnet Sierras, DO  amLODipine (NORVASC) 10 MG tablet Take 1 tablet (10 mg total) by mouth daily. 05/31/21  Yes Freada Bergeron, MD  cetirizine (ZYRTEC ALLERGY) 10 MG tablet Take 1 tablet (10 mg total) by mouth 2 (two) times daily. 03/26/21  Yes Garnet Sierras, DO  clonazePAM (KLONOPIN) 1 MG tablet Take 1 tablet (1 mg total) by mouth 3 (three) times daily as needed for anxiety (anxiety). 04/08/22 04/08/23 Yes Vista Mink, MD  cloNIDine (CATAPRES - DOSED IN MG/24 HR) 0.3 mg/24hr patch Place 1 patch (0.3 mg total) onto the skin once a week. 03/20/22  Yes Mariel Aloe, MD  gabapentin (NEURONTIN) 300 MG capsule Take 1 capsule (300 mg total) by mouth 2 (two) times daily. 03/17/22 04/16/22 Yes Mariel Aloe, MD  HYDROcodone-acetaminophen (NORCO/VICODIN) 5-325 MG tablet Take 1 tablet by mouth every 4 (four) hours as needed. 04/12/22  Yes Tacy Learn, PA-C  levothyroxine (SYNTHROID) 125 MCG tablet Take 1 tablet (125 mcg total) by mouth daily before  breakfast. 05/24/21  Yes Philemon Kingdom, MD  lurasidone (LATUDA) 20 MG TABS tablet Take 1 tablet (20 mg total) by mouth daily. Take with food (dinner) 04/08/22  Yes Vista Mink, MD  opium-belladonna (B&O SUPPRETTES) 16.2-60 MG suppository Place 1 suppository rectally as needed for bladder spasms. 04/12/22  Yes Tacy Learn, PA-C  Prenatal Multivit-Min-Fe-FA (PRE-NATAL PO) Take 1 tablet by mouth daily.   Yes [provider]  propranolol (INDERAL) 20 MG tablet Take 1 tablet (20 mg total) by mouth 2 (two) times daily as needed (for anxiety). 04/08/22  Yes Vista Mink, MD  SYMBICORT 80-4.5 MCG/ACT inhaler Inhale 2 puffs into the lungs 2 (two) times daily.   Yes [provider]  venlafaxine XR (EFFEXOR XR) 37.5 MG 24 hr capsule Take 1 capsule (37.5 mg total) by mouth daily. Take with 75 mg for a total of 112.5 mg for 7 days before decreasing to 75 mg daily for 7 days and then decrease to 37.5 mg for 7 days before discontinuing 04/08/22 04/08/23 Yes Vista Mink, MD  chlorthalidone (HYGROTON) 25 MG tablet Take 1 tablet (25 mg total) by mouth daily. 04/09/22   Skeet Latch, MD  EPINEPHrine 0.3 mg/0.3 mL IJ SOAJ injection Inject 0.3 mg into the muscle as needed for anaphylaxis. 01/15/21   Garnet Sierras, DO  erythromycin (ERY-TAB) 250 MG EC tablet Take 1 tablet (250 mg total) by mouth 3 (three) times daily. 03/17/22 04/16/22  Mariel Aloe, MD  famotidine (  PEPCID) 20 MG tablet Take 1 tablet (20 mg total) by mouth 2 (two) times daily. 12/04/21   Garnet Sierras, DO  FLUoxetine (PROZAC) 10 MG capsule Take 1 capsule (10 mg total) by mouth daily for 7 days, THEN 2 capsules (20 mg total) daily for 7 days, THEN 3 capsules (30 mg total) daily for 7 days, THEN 4 capsules (40 mg total) daily for 20 days. 04/08/22 05/19/22  Vista Mink, MD  ondansetron (ZOFRAN-ODT) 4 MG disintegrating tablet Take 1 tablet (4 mg total) by mouth every 8 (eight) hours as needed for nausea or vomiting. 03/02/22   Rex Kras, PA  spironolactone (ALDACTONE) 25 MG tablet Take 1 tablet (25 mg total) by mouth daily. 09/19/21   Freada Bergeron, MD  valsartan (DIOVAN) 320 MG tablet Take 1 tablet (320 mg total) by mouth daily. 04/09/22   Skeet Latch, MD      Allergies    Coreg [carvedilol], Other, Red dye, Compazine [prochlorperazine maleate], Vicodin [hydrocodone-acetaminophen], Amitriptyline hcl, Duloxetine hcl, Lisinopril, Naproxen, Reglan [metoclopramide], and Nifedipine    Review of Systems   Review of Systems Negative except as per HPI Physical Exam Updated Vital Signs BP 136/88   Pulse 65   Temp 98 F (36.7 C) (Oral)   Resp 18   Ht 5' 11"$  (1.803 m)   Wt 109.3 kg   SpO2 98%   BMI 33.61 kg/m  Physical Exam Vitals and nursing note reviewed.  Constitutional:      General: She is not in acute distress.    Appearance: She is well-developed. She is not diaphoretic.  HENT:     Head: Normocephalic and atraumatic.  Cardiovascular:     Rate and Rhythm: Normal rate and regular rhythm.     Heart sounds: Normal heart sounds.  Pulmonary:     Effort: Pulmonary effort is normal.     Breath sounds: Normal breath sounds.  Abdominal:     Palpations: Abdomen is soft.     Tenderness: There is abdominal tenderness in the suprapubic area and left lower quadrant. There is left CVA tenderness. There is no right CVA tenderness.  Skin:    General: Skin is warm and dry.     Findings: No erythema or rash.  Neurological:     Mental Status: She is alert and oriented to person, place, and time.  Psychiatric:        Behavior: Behavior normal.     ED Results / Procedures / Treatments   Labs (all labs ordered are listed, but only abnormal results are displayed) Labs Reviewed  URINALYSIS, ROUTINE W REFLEX MICROSCOPIC - Abnormal; Notable for the following components:      Result Value   Color, Urine COLORLESS (*)    Hgb urine dipstick LARGE (*)    All other components within normal limits  CBC WITH  DIFFERENTIAL/PLATELET  COMPREHENSIVE METABOLIC PANEL  PREGNANCY, URINE    EKG None  Radiology US PELVIC COMPLETE W TRANSVAGINAL AND TORSION R/O  Result Date: 04/12/2022 CLINICAL DATA:  Severe left lower quadrant pain with vaginal bleeding for 1-1/2 weeks, ovarian cyst identified by CT EXAM: TRANSABDOMINAL AND TRANSVAGINAL ULTRASOUND OF PELVIS DOPPLER ULTRASOUND OF OVARIES TECHNIQUE: Both transabdominal and transvaginal ultrasound examinations of the pelvis were performed. Transabdominal technique was performed for global imaging of the pelvis including uterus, ovaries, adnexal regions, and pelvic cul-de-sac. It was necessary to proceed with endovaginal exam following the transabdominal exam to visualize the uterus, ovaries, endometrium, and adnexa. Color and duplex Doppler  ultrasound was utilized to evaluate blood flow to the ovaries. COMPARISON:  Same-day CT abdomen pelvis FINDINGS: Uterus Measurements: 7.7 x 3.4 x 4.8 cm = volume: 66 mL. No fibroids or other mass visualized. Endometrium Thickness: 0.6 cm.  No focal abnormality visualized. Right ovary Not visualized. Left ovary Measurements: 6.3 x 6.2 x 5.8 cm = volume: 117 mL. The ovary is enlarged by a hypoechoic cyst with some layering internal debris measuring 6.2 x 5.3 x 5.4 cm. Pulsed Doppler evaluation of the left ovary demonstrates normal low-resistance arterial and venous waveforms. Other findings Trace free fluid in the low pelvis. IMPRESSION: 1. The left ovary is enlarged by a hypoechoic cyst with some layering internal debris measuring 6.2 x 5.3 x 5.4 cm. Findings are most consistent with a hemorrhagic cyst. Recommend follow-up ultrasound in 6 weeks to ensure resolution. 2. Right ovary is not visualized by ultrasound. 3. Trace free fluid in the low pelvis. 4. Normal arterial and venous Doppler flow is present to the left ovary. Electronically Signed   By: Delanna Ahmadi M.D.   On: 04/12/2022 16:51   CT Renal Stone Study  Result Date:  04/12/2022 CLINICAL DATA:  Left lower quadrant/flank pain for 3 days. Nausea and vomiting. EXAM: CT ABDOMEN AND PELVIS WITHOUT CONTRAST TECHNIQUE: Multidetector CT imaging of the abdomen and pelvis was performed following the standard protocol without IV contrast. RADIATION DOSE REDUCTION: This exam was performed according to the departmental dose-optimization program which includes automated exposure control, adjustment of the mA and/or kV according to patient size and/or use of iterative reconstruction technique. COMPARISON:  CT scan 03/02/2022 FINDINGS: Lower chest: The lung bases are clear of acute process. No pleural effusion or pulmonary lesions. The heart is normal in size. No pericardial effusion. The distal esophagus and aorta are unremarkable. Hepatobiliary: No hepatic lesions or intrahepatic biliary dilatation. The gallbladder is surgically absent. No common bile duct dilatation. Pancreas: No mass, inflammation or ductal dilatation. Spleen: Very irregular nodular appearing spleen appears stable. Could be related to prior trauma. No lesions. Adrenals/Urinary Tract: The adrenal glands are normal. No renal, ureteral or bladder calculi. No worrisome renal or bladder lesions without contrast. Stomach/Bowel: The stomach, duodenum, small bowel and colon are unremarkable. No acute inflammatory process, mass lesions or obstructive findings. The appendix is surgically absent. Vascular/Lymphatic: The aorta is normal in caliber. No atheroscerlotic calcifications. No mesenteric of retroperitoneal mass or adenopathy. Small scattered lymph nodes are noted. Reproductive: The uterus is unremarkable. There is a new 6 x 5.7 x 5.6 cm cyst associated with the left ovary. It measures 20 Hounsfield units. The fact that it was not present in January of this year would suggest a benign process. Recommend follow-up pelvic ultrasound examination in 3-6 months. Note: This recommendation does not apply to premenarchal patients and to  those with increased risk (genetic, family history, elevated tumor markers or other high-risk factors) of ovarian cancer. Reference: JACR 2020 Feb; 17(2):248-254 Other: No pelvic mass or adenopathy. No free pelvic fluid collections. No inguinal mass or adenopathy. No abdominal wall hernia or subcutaneous lesions. Musculoskeletal: No significant bony findings. IMPRESSION: 1. No acute abdominal/pelvic findings, mass lesions or adenopathy. 2. No renal, ureteral or bladder calculi or mass. 3. New 6 x 5.7 x 5.6 cm left ovarian cyst. Recommend follow-up pelvic ultrasound examination in 3-6 months. 4. Status post cholecystectomy. No biliary dilatation. Electronically Signed   By: Marijo Sanes M.D.   On: 04/12/2022 14:55    Procedures Procedures    Medications Ordered in  ED Medications  diphenhydrAMINE (BENADRYL) capsule 25 mg (0 mg Oral Hold 04/12/22 1720)  fentaNYL (SUBLIMAZE) injection 50 mcg (50 mcg Intravenous Given 04/12/22 1438)  ondansetron (ZOFRAN) injection 4 mg (4 mg Intravenous Given 04/12/22 1439)  sodium chloride 0.9 % bolus 1,000 mL (1,000 mLs Intravenous New Bag/Given 04/12/22 1438)  HYDROmorphone (DILAUDID) injection 1 mg (1 mg Intravenous Given 04/12/22 1513)  ondansetron (ZOFRAN) injection 4 mg (4 mg Intravenous Given 04/12/22 1513)  HYDROmorphone (DILAUDID) injection 1 mg (1 mg Intravenous Given 04/12/22 1652)  ketorolac (TORADOL) 15 MG/ML injection 15 mg (15 mg Intravenous Given 04/12/22 1722)  hydrALAZINE (APRESOLINE) injection 20 mg (20 mg Intravenous Given 04/12/22 1744)  diphenhydrAMINE (BENADRYL) injection 25 mg (25 mg Intravenous Given 04/12/22 1741)    ED Course/ Medical Decision Making/ A&P                             Medical Decision Making Amount and/or Complexity of Data Reviewed Labs: ordered. Radiology: ordered.  Risk Prescription drug management.   This patient presents to the ED for concern of pelvic pain, this involves an extensive number of treatment options, and  is a complaint that carries with it a high risk of complications and morbidity.  The differential diagnosis includes but not limited to urinary tract infection, pyelonephritis,Kidney stone, colitis, diverticulitis, ovarian cyst, ovarian torsion   Co morbidities that complicate the patient evaluation  Significant past medical history as reviewed in the chart including interstitial cystitis, hypertension (resistant)   Additional history obtained:  External records from outside source obtained and reviewed including prior imaging and labs on file   Lab Tests:  I Ordered, and personally interpreted labs.  The pertinent results include: hCG negative.  CBC within normal limits.  CMP within normal meds.  Urinalysis with large hemoglobin, greater than 50 red cells, negative for leukocytes and nitrites   Imaging Studies ordered:  I ordered imaging studies including CT abdomen pelvis stone study, negative for stone, does show left ovarian cyst.  Pelvic ultrasound ordered to rule out torsion secondary to left ovarian cyst, shows Doppler flow to the ovary with likely hemorrhagic left ovarian cyst I independently visualized and interpreted imaging which showed as above I agree with the radiologist interpretation  Consultations Obtained:  I requested consultation with the ER attending, Dr. Festus Barren,  and discussed lab and imaging findings as well as pertinent plan - they recommend: Hydralazine IV for patient's significantly elevated blood pressure which is not improving with pain medications.  Does improve with hydralazine, stable for discharge, recommend prescription for belladonna opium suppositories for her IC   Problem List / ED Course / Critical interventions / Medication management  32 year old female presents with complaint of pelvic pain as above.  Workup reveals 5 cm left ovarian hemorrhagic cyst which is negative for torsion.  Patient's pain seems to occur more with feeling and emptying of  her bladder, consider possibly more related to IC.  Patient is discharged with prescription for Norco and belladonna opium suppositories with plan to follow-up with her care team. I ordered medication including fentanyl, Zofran, Dilaudid, Toradol, Benadryl, hydralazine for pain, hypertension, nausea, itching Reevaluation of the patient after these medicines showed that the patient improved I have reviewed the patients home medicines and have made adjustments as needed   Social Determinants of Health:  Has specialty care team however is awaiting referral to [facility] specialist   Test / Admission - Considered:  Blood pressure improved,  pain better controlled, will discharge with plan to follow-up with care team.         Final Clinical Impression(s) / ED Diagnoses Final diagnoses:  Cyst of left ovary  Interstitial cystitis  Hypertension, unspecified type    Rx / DC Orders ED Discharge Orders          Ordered    opium-belladonna (B&O SUPPRETTES) 16.2-60 MG suppository  As needed        04/12/22 1815    HYDROcodone-acetaminophen (NORCO/VICODIN) 5-325 MG tablet  Every 4 hours PRN        04/12/22 1815              Tacy Learn, PA-C 04/12/22 Harland Dingwall, MD 04/16/22 1406

## 2022-04-13 ENCOUNTER — Other Ambulatory Visit: Payer: Self-pay

## 2022-04-13 ENCOUNTER — Emergency Department (HOSPITAL_COMMUNITY): Payer: Self-pay

## 2022-04-13 ENCOUNTER — Emergency Department (HOSPITAL_COMMUNITY)
Admission: EM | Admit: 2022-04-13 | Discharge: 2022-04-13 | Disposition: A | Discharge status: Home / Self Care | Payer: Self-pay | Attending: Emergency Medicine | Admitting: Emergency Medicine

## 2022-04-13 ENCOUNTER — Other Ambulatory Visit: Payer: Self-pay | Admitting: Allergy

## 2022-04-13 DIAGNOSIS — R109 Unspecified abdominal pain: Secondary | ICD-10-CM | POA: Insufficient documentation

## 2022-04-13 LAB — COMPREHENSIVE METABOLIC PANEL
ALT: 18 U/L (ref 0–44)
AST: 19 U/L (ref 15–41)
Albumin: 4.2 g/dL (ref 3.5–5.0)
Alkaline Phosphatase: 53 U/L (ref 38–126)
Anion gap: 8 (ref 5–15)
BUN: 12 mg/dL (ref 6–20)
CO2: 23 mmol/L (ref 22–32)
Calcium: 8.9 mg/dL (ref 8.9–10.3)
Chloride: 106 mmol/L (ref 98–111)
Creatinine, Ser: 0.96 mg/dL (ref 0.44–1.00)
GFR, Estimated: >60 mL/min (ref >60)
Glucose, Bld: 94 mg/dL (ref 70–99)
Potassium: 3.6 mmol/L (ref 3.5–5.1)
Sodium: 137 mmol/L (ref 135–145)
Total Bilirubin: 0.8 mg/dL (ref 0.3–1.2)
Total Protein: 6.9 g/dL (ref 6.5–8.1)

## 2022-04-13 LAB — CBC WITH DIFFERENTIAL/PLATELET
Abs Immature Granulocytes: 0.01 10*3/uL (ref 0.00–0.07)
Basophils Absolute: 0 10*3/uL (ref 0.0–0.1)
Basophils Relative: 0 %
Eosinophils Absolute: 0 10*3/uL (ref 0.0–0.5)
Eosinophils Relative: 0 %
HCT: 38.9 % (ref 36.0–46.0)
Hemoglobin: 13.4 g/dL (ref 12.0–15.0)
Immature Granulocytes: 0 %
Lymphocytes Relative: 42 %
Lymphs Abs: 2.6 10*3/uL (ref 0.7–4.0)
MCH: 32.1 pg (ref 26.0–34.0)
MCHC: 34.4 g/dL (ref 30.0–36.0)
MCV: 93.3 fL (ref 80.0–100.0)
Monocytes Absolute: 0.7 10*3/uL (ref 0.1–1.0)
Monocytes Relative: 12 %
Neutro Abs: 2.9 10*3/uL (ref 1.7–7.7)
Neutrophils Relative %: 46 %
Platelets: 292 10*3/uL (ref 150–400)
RBC: 4.17 MIL/uL (ref 3.87–5.11)
RDW: 12.1 % (ref 11.5–15.5)
WBC: 6.3 10*3/uL (ref 4.0–10.5)
nRBC: 0 % (ref 0.0–0.2)

## 2022-04-13 LAB — LIPASE, BLOOD: Lipase: 28 U/L (ref 11–51)

## 2022-04-13 MED ORDER — HYDROXYZINE HCL 25 MG PO TABS: 25.0000 mg | ORAL_TABLET | Freq: Four times a day (QID) | ORAL | 0 refills | Status: DC

## 2022-04-13 MED ORDER — ONDANSETRON HCL 4 MG/2ML IJ SOLN
4.0000 mg | Freq: Once | INTRAMUSCULAR | Status: AC
  Administered 2022-04-13: 4 mg via INTRAVENOUS
  Filled 2022-04-13: qty 2

## 2022-04-13 MED ORDER — LACTATED RINGERS IV BOLUS
1000.0000 mL | Freq: Once | INTRAVENOUS | Status: AC
  Administered 2022-04-13: 1000 mL via INTRAVENOUS

## 2022-04-13 MED ORDER — OXYCODONE HCL 5 MG PO TABS
5.0000 mg | ORAL_TABLET | Freq: Once | ORAL | Status: AC
  Administered 2022-04-13: 5 mg via ORAL
  Filled 2022-04-13: qty 1

## 2022-04-13 MED ORDER — MORPHINE SULFATE (PF) 4 MG/ML IV SOLN
8.0000 mg | Freq: Once | INTRAVENOUS | Status: AC
  Administered 2022-04-13: 8 mg via INTRAVENOUS
  Filled 2022-04-13: qty 2

## 2022-04-13 NOTE — ED Provider Notes (Signed)
[place] EMERGENCY DEPARTMENT AT [facility] Provider Note   CSN: [id number] Arrival date & time: 04/13/22  1912     History Chief Complaint  Patient presents with  . Flank Pain    HPI Krystal [name] is a 32 y.o. female presenting for recurrence of her flank pain.. States that she was seen yesterday for severe pelvic pain and was diagnosed with a hemorrhagic cyst.  Symptoms seem to be improving overnight.  And then today suddenly she had worsening pain. She feels that her pain is more related to her interstitial cystitis.  However it became more sharp in her lower pelvis today.  Patient's recorded medical, surgical, social, medication list and allergies were reviewed in the Snapshot window as part of the initial history.   Review of Systems   Review of Systems  Constitutional:  Negative for chills and fever.  HENT:  Negative for ear pain and sore throat.   Eyes:  Negative for pain and visual disturbance.  Respiratory:  Negative for cough and shortness of breath.   Cardiovascular:  Negative for chest pain and palpitations.  Gastrointestinal:  Positive for abdominal pain. Negative for rectal pain and vomiting.  Genitourinary:  Negative for dysuria and hematuria.  Musculoskeletal:  Negative for arthralgias and back pain.  Skin:  Negative for color change and rash.  Neurological:  Negative for seizures and syncope.  All other systems reviewed and are negative.   Physical Exam Updated Vital Signs BP (!) 154/107   Pulse 80   Temp 98.2 F (36.8 C) (Oral)   Resp 18   LMP 04/13/2022 (Exact Date)   SpO2 96%  Physical Exam Vitals and nursing note reviewed.  Constitutional:      General: She is not in acute distress.    Appearance: She is well-developed.  HENT:     Head: Normocephalic and atraumatic.  Eyes:     Conjunctiva/sclera: Conjunctivae normal.  Cardiovascular:     Rate and Rhythm: Normal rate and regular rhythm.     Heart sounds: No murmur  heard. Pulmonary:     Effort: Pulmonary effort is normal. No respiratory distress.     Breath sounds: Normal breath sounds.  Abdominal:     General: There is no distension.     Palpations: Abdomen is soft.     Tenderness: There is no abdominal tenderness. There is no right CVA tenderness or left CVA tenderness.  Musculoskeletal:        General: No swelling or tenderness. Normal range of motion.     Cervical back: Neck supple.  Skin:    General: Skin is warm and dry.  Neurological:     General: No focal deficit present.     Mental Status: She is alert and oriented to person, place, and time. Mental status is at baseline.     Cranial Nerves: No cranial nerve deficit.     ED Course/ Medical Decision Making/ A&P    Procedures Procedures   Medications Ordered in ED Medications  ondansetron (ZOFRAN) injection 4 mg (4 mg Intravenous Given 04/13/22 2014)  lactated ringers bolus 1,000 mL (1,000 mLs Intravenous New Bag/Given 04/13/22 2014)  morphine (PF) 4 MG/ML injection 8 mg (8 mg Intravenous Given 04/13/22 2015)   Medical Decision Making:   [name] is a 32 y.o. female who presented to the ED today with abdominal pain, detailed above.    Patient's presentation is complicated by their history of an extensive medical list.  Patient placed on  continuous vitals and telemetry monitoring while in ED which was reviewed periodically.  Complete initial physical exam performed, notably the patient  was hemodynamically stable in no acute distress.  Diffuse lower abdominal pain identified.     Reviewed and confirmed nursing documentation for past medical history, family history, social history.    Initial Assessment:   With the patient's presentation of abdominal pain, most likely diagnosis is nonspecific etiology. Other diagnoses were considered including (but not limited to) gastroenteritis, colitis, small bowel obstruction, appendicitis, cholecystitis, pancreatitis, nephrolithiasis,  UTI, pyleonephritis, ruptured ectopic pregnancy, ovarian torsion. These are considered less likely due to history of present illness and physical exam findings.   This is most consistent with an acute life/limb threatening illness complicated by underlying chronic conditions.   Initial Plan:  CBC/CMP to evaluate for underlying infectious/metabolic etiology for patient's abdominal pain  Lipase to evaluate for pancreatitis  EKG to evaluate for cardiac source of pain  ***CTAB/Pelvis with contrast to evaluate for structural/surgical etiology of patients' severe abdominal pain.  ***CT Ab/pelvis without contrast due to favored nephrolithiasis over GI etiology for patient's abdominal pain  ***Testicular ***Pelvic US to evaluate for torsion  Urinalysis and repeat physical assessment to evaluate for UTI/Pyelonpehritis  Empiric management of symptoms with escalating pain control and antiemetics as needed.   Initial Study Results:   Laboratory  All laboratory results reviewed without evidence of clinically relevant pathology.   ***Exceptions include: ***    ***EKG ***EKG was reviewed independently. Rate, rhythm, axis, intervals all examined and without medically relevant abnormality. ST segments without concerns for elevations.    Radiology All images reviewed independently. ***Agree with radiology report at this time.   US PELVIC COMPLETE W TRANSVAGINAL AND TORSION R/O  Result Date: 04/13/2022 CLINICAL DATA:  Pelvic pain EXAM: TRANSABDOMINAL AND TRANSVAGINAL ULTRASOUND OF PELVIS DOPPLER ULTRASOUND OF OVARIES TECHNIQUE: Both transabdominal and transvaginal ultrasound examinations of the pelvis were performed. Transabdominal technique was performed for global imaging of the pelvis including uterus, ovaries, adnexal regions, and pelvic cul-de-sac. It was necessary to proceed with endovaginal exam following the transabdominal exam to visualize the ovaries. Color and duplex Doppler ultrasound was utilized  to evaluate blood flow to the ovaries. COMPARISON:  Ultrasound from the previous day. FINDINGS: Uterus Measurements: 7.5 x 4.0 x 5.1 cm. = volume: 80 mL. No fibroids or other mass visualized. Endometrium Thickness: 4.5 mm. Minimal fluid is noted within the endometrial canal. Right ovary Not visualized similar to that seen on the previous day. Left ovary Measurements: 7.1 x 5.4 x 5.3 cm. = volume: 106 mL. 5.9 cm predominately hypoechoic cyst is noted within the left ovary similar to that seen on the prior exam with some layering debris. This is again most consistent with a hemorrhagic cyst and stable from the previous day. Pulsed Doppler evaluation of left ovary demonstrates normal low-resistance arterial and venous waveforms. Other findings No abnormal free fluid. IMPRESSION: Stable cystic lesion within the left ovary likely representing a hemorrhagic cyst. No findings to suggest ovarian torsion are noted. Follow-up continues as previously described. Electronically Signed   By: Inez Catalina M.D.   On: 04/13/2022 21:39   US PELVIC COMPLETE W TRANSVAGINAL AND TORSION R/O  Result Date: 04/12/2022 CLINICAL DATA:  Severe left lower quadrant pain with vaginal bleeding for 1-1/2 weeks, ovarian cyst identified by CT EXAM: TRANSABDOMINAL AND TRANSVAGINAL ULTRASOUND OF PELVIS DOPPLER ULTRASOUND OF OVARIES TECHNIQUE: Both transabdominal and transvaginal ultrasound examinations of the pelvis were performed. Transabdominal technique was performed for global imaging  of the pelvis including uterus, ovaries, adnexal regions, and pelvic cul-de-sac. It was necessary to proceed with endovaginal exam following the transabdominal exam to visualize the uterus, ovaries, endometrium, and adnexa. Color and duplex Doppler ultrasound was utilized to evaluate blood flow to the ovaries. COMPARISON:  Same-day CT abdomen pelvis FINDINGS: Uterus Measurements: 7.7 x 3.4 x 4.8 cm = volume: 66 mL. No fibroids or other mass visualized. Endometrium  Thickness: 0.6 cm.  No focal abnormality visualized. Right ovary Not visualized. Left ovary Measurements: 6.3 x 6.2 x 5.8 cm = volume: 117 mL. The ovary is enlarged by a hypoechoic cyst with some layering internal debris measuring 6.2 x 5.3 x 5.4 cm. Pulsed Doppler evaluation of the left ovary demonstrates normal low-resistance arterial and venous waveforms. Other findings Trace free fluid in the low pelvis. IMPRESSION: 1. The left ovary is enlarged by a hypoechoic cyst with some layering internal debris measuring 6.2 x 5.3 x 5.4 cm. Findings are most consistent with a hemorrhagic cyst. Recommend follow-up ultrasound in 6 weeks to ensure resolution. 2. Right ovary is not visualized by ultrasound. 3. Trace free fluid in the low pelvis. 4. Normal arterial and venous Doppler flow is present to the left ovary. Electronically Signed   By: Delanna Ahmadi M.D.   On: 04/12/2022 16:51   CT Renal Stone Study  Result Date: 04/12/2022 CLINICAL DATA:  Left lower quadrant/flank pain for 3 days. Nausea and vomiting. EXAM: CT ABDOMEN AND PELVIS WITHOUT CONTRAST TECHNIQUE: Multidetector CT imaging of the abdomen and pelvis was performed following the standard protocol without IV contrast. RADIATION DOSE REDUCTION: This exam was performed according to the departmental dose-optimization program which includes automated exposure control, adjustment of the mA and/or kV according to patient size and/or use of iterative reconstruction technique. COMPARISON:  CT scan 03/02/2022 FINDINGS: Lower chest: The lung bases are clear of acute process. No pleural effusion or pulmonary lesions. The heart is normal in size. No pericardial effusion. The distal esophagus and aorta are unremarkable. Hepatobiliary: No hepatic lesions or intrahepatic biliary dilatation. The gallbladder is surgically absent. No common bile duct dilatation. Pancreas: No mass, inflammation or ductal dilatation. Spleen: Very irregular nodular appearing spleen appears  stable. Could be related to prior trauma. No lesions. Adrenals/Urinary Tract: The adrenal glands are normal. No renal, ureteral or bladder calculi. No worrisome renal or bladder lesions without contrast. Stomach/Bowel: The stomach, duodenum, small bowel and colon are unremarkable. No acute inflammatory process, mass lesions or obstructive findings. The appendix is surgically absent. Vascular/Lymphatic: The aorta is normal in caliber. No atheroscerlotic calcifications. No mesenteric of retroperitoneal mass or adenopathy. Small scattered lymph nodes are noted. Reproductive: The uterus is unremarkable. There is a new 6 x 5.7 x 5.6 cm cyst associated with the left ovary. It measures 20 Hounsfield units. The fact that it was not present in January of this year would suggest a benign process. Recommend follow-up pelvic ultrasound examination in 3-6 months. Note: This recommendation does not apply to premenarchal patients and to those with increased risk (genetic, family history, elevated tumor markers or other high-risk factors) of ovarian cancer. Reference: JACR 2020 Feb; 17(2):248-254 Other: No pelvic mass or adenopathy. No free pelvic fluid collections. No inguinal mass or adenopathy. No abdominal wall hernia or subcutaneous lesions. Musculoskeletal: No significant bony findings. IMPRESSION: 1. No acute abdominal/pelvic findings, mass lesions or adenopathy. 2. No renal, ureteral or bladder calculi or mass. 3. New 6 x 5.7 x 5.6 cm left ovarian cyst. Recommend follow-up pelvic ultrasound examination  in 3-6 months. 4. Status post cholecystectomy. No biliary dilatation. Electronically Signed   By: Marijo Sanes M.D.   On: 04/12/2022 14:55     Consults: Case discussed with ***.   Final Reassessment and Plan:   ***      Clinical Impression: No diagnosis found.   Data Unavailable   Final Clinical Impression(s) / ED Diagnoses Final diagnoses:  None    Rx / DC Orders ED Discharge Orders     None

## 2022-04-13 NOTE — ED Triage Notes (Signed)
Patient arrived with complaints of NV and left sided flank pain over the last three days. States she was dx with ovarian cyst yesterday.

## 2022-04-15 ENCOUNTER — Telehealth: Payer: Self-pay

## 2022-04-15 ENCOUNTER — Encounter: Payer: Self-pay | Admitting: Obstetrics and Gynecology

## 2022-04-15 ENCOUNTER — Ambulatory Visit (INDEPENDENT_AMBULATORY_CARE_PROVIDER_SITE_OTHER): Payer: Self-pay | Admitting: Obstetrics and Gynecology

## 2022-04-15 ENCOUNTER — Other Ambulatory Visit (HOSPITAL_COMMUNITY)
Admission: RE | Admit: 2022-04-15 | Discharge: 2022-04-15 | Disposition: A | Discharge status: Home / Self Care | Payer: Self-pay | Source: Ambulatory Visit | Attending: Obstetrics and Gynecology | Admitting: Obstetrics and Gynecology

## 2022-04-15 VITALS — BP 139/99 | HR 80 | Ht 71.0 in | Wt 238.5 lb

## 2022-04-15 DIAGNOSIS — R102 Pelvic and perineal pain unspecified side: Secondary | ICD-10-CM

## 2022-04-15 DIAGNOSIS — N83202 Unspecified ovarian cyst, left side: Secondary | ICD-10-CM

## 2022-04-15 HISTORY — DX: Unspecified ovarian cyst, left side: N83.202

## 2022-04-15 MED ORDER — OXYCODONE HCL 5 MG PO TABS: 5.0000 mg | ORAL_TABLET | ORAL | 0 refills | Status: DC | PRN

## 2022-04-15 NOTE — Telephone Encounter (Signed)
**Note De-Identified Krystal [name] Obfuscation** I attempted a Chlorthalidone PA through covermymeds but received this message: To initiate an authorization request for this medication, please contact [employer] at [phone].  I called [employer] and attempted to do the PA over the phone but was advised by Krystal [name] that the pt has no active coverage at this time.  I have notified [place], [place] (Ph: [phone]) of this outcome Tamikka Pilger VM as the pharmacist was on a meal break.

## 2022-04-15 NOTE — Progress Notes (Signed)
*** Obstetrics and Gynecology New Patient*** Evaluation  Appointment Date: 04/15/2022  OBGYN Clinic: [facility]-***  Primary Care Provider: Patient, No Pcp Per  Referring Provider: No ref. provider found  Chief Complaint:  Chief Complaint  Patient presents with   Follow-up    History of Present Illness: Krystal [name] is a 32 y.o. {Race:20311} [id number] (Patient's last menstrual period was 04/13/2022 (exact date).), seen for the above chief complaint. Her past medical history is significant for ***    No breast s/s, fevers, chills, chest pain, SOB, nausea, vomiting, abdominal pain, dysuria, hematuria, vaginal itching, dyspareunia, SUI or OAB, diarrhea, constipation, blood in BMs  Review of Systems: {ros; complete:30496}   As Per HPI otherwise negative***  Patient Active Problem List   Diagnosis Date Noted   Hemorrhagic cyst of left ovary 04/15/2022   AKI (acute kidney injury) ([place]) 03/16/2022   Hypothyroidism 03/11/2022   GERD with esophagitis 03/11/2022   Retinal vein occlusion of right eye 03/11/2022   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 03/05/2022   Moderate persistent asthma with acute exacerbation 12/04/2021   Cervicalgia 10/31/2021   Borderline personality disorder in adult [facility]) 10/21/2021   Intractable nausea and vomiting 07/05/2021   Anisocoria 07/02/2021   History of excision of intestinal structure 05/23/2021   Attention deficit disorder 04/17/2021   Chronic fatigue syndrome 04/17/2021   Anosmia 04/17/2021   Chronic urticaria 01/15/2021   Local reaction to hymenoptera sting 01/15/2021   Perennial allergic rhinitis 01/15/2021   MDD (major depressive disorder), recurrent episode, severe ([place]) 11/19/2020   Fibromyalgia 05/11/2020   Gastroesophageal reflux disease 05/11/2020   Interstitial cystitis 05/11/2020   PTSD (post-traumatic stress disorder) 05/11/2020   Primary insomnia 05/11/2020   Resistant hypertension  05/11/2020   Hypertensive urgency 05/08/2020   Hypothyroidism due to Hashimoto's thyroiditis 10/03/2019   Anxiety 06/16/2019   Obesity 06/07/2019   Lumbar spondylosis 06/23/2018   Sacroiliac inflammation ([place]) 06/23/2018   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   History of upper limb amputation, wrist, 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    {Common ambulatory SmartLinks:19316}  Past Medical History:  Past Medical History:  Diagnosis Date   Acute acalculous cholecystitis s/p lap cholecystectomy 05/11/2020 05/11/2020   Angio-edema 01/15/2021   Anxiety    Asthma    Depression    HELLP (hemolytic anemia/elev liver enzymes/low platelets in pregnancy) 12/08/2017   History of borderline personality disorder    History of HELLP syndrome, currently pregnant 04/21/2019   Formatting of this note might be different from the original.  History of HELLP syndrome at 26 weeks 5 days (12/07/2017); [facility] to [facility] with BP 200/120s, Plt 52, AST/ALT 471/375  APS evaluation negative (04/2019)   Daily ASA 81 mg   Challenging blood pressure control in current pregnancy      Plan:  - Baseline pre-eclampsia labs this pregnancy within normal limits (Cr: 0.71; urine p:c 0.106)  -    IC (interstitial cystitis)    Kidney stones    Migraines    Necrotizing fasciitis ([place])    left hand amputation   PTSD (post-traumatic stress disorder)    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   Severe uncontrolled hypertension 10/08/2017   UTI (urinary tract infection)     Past Surgical History:  Past Surgical History:  Procedure Laterality Date   APPENDECTOMY     BIOPSY  05/10/2020   Procedure: BIOPSY;  Surgeon: Arta Silence, MD;  Location: [facility];  Service: Endoscopy;;   CESAREAN SECTION N/A 12/06/2017   Procedure: CESAREAN SECTION;  Surgeon: Sloan Leiter, MD;  Location: [place];  Service: Obstetrics;  Laterality: N/A;   CESAREAN SECTION  07/21/2021   Procedure: CESAREAN SECTION;  Surgeon: Donnamae Jude, MD;  Location: [place]  [facility];  Service: Obstetrics;;   CHOLECYSTECTOMY N/A 05/11/2020   Procedure: SINGLE SITE LAPAROSCOPIC CHOLECYSTECTOMY AND LIVER BIOSY;  Surgeon: Michael Boston, MD;  Location: [facility];  Service: General;  Laterality: N/A;   ESOPHAGOGASTRODUODENOSCOPY (EGD) WITH PROPOFOL N/A 05/10/2020   Procedure: ESOPHAGOGASTRODUODENOSCOPY (EGD) WITH PROPOFOL;  Surgeon: Arta Silence, MD;  Location: [facility];  Service: Endoscopy;  Laterality: N/A;   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   HAND RECONSTRUCTION Right    INCISION AND DRAINAGE     NASAL SEPTUM SURGERY      Past Obstetrical History:  OB History  Gravida Para Term Preterm AB Living  5 3 0 '3 2 2  '$ SAB IAB Ectopic Multiple Live Births  2 0 0 0 2    # Outcome Date GA Lbr Len/2nd Weight Sex Delivery Anes PTL Lv  5 Preterm 07/21/21 [redacted]w[redacted]d   CS-LTranv     4 SAB 08/2020          3 Preterm 09/14/19 338w3d4 lb 7 oz (2.013 kg) F CS-LTranv   LIV  2 Preterm 12/07/17 2646w5d lb 10.8 oz (0.76 kg) F CS-LTranv Gen  LIV     Birth Comments: ELBW  1 SAB             SVD x ***, Cesarean section x ***  Past Gynecological History: As per HPI. Menarche age *** Periods: *** History of Pap Smear(s): {YES NO:GQ:4175516st pap ***, which was *** History of STI(s): {YES NO:GQ:4175516e is currently using {PLAN CONTRACEPTION:313102} for contraception.  History of HRT use: {YES NO:314532} HPV vaccine series complete: {YES/NO/NOT APPLICABLE:20182}  Social History:  Social History   Socioeconomic History   Marital status: Married    Spouse name: Not on file   Number of children: 3   Years of education: Not on file   Highest education level: 8th grade  Occupational History   Not on file  Tobacco Use   Smoking status: Never   Smokeless tobacco: Never   Tobacco comments:    She tried a cigarette and never smoked again  Vaping Use   Vaping Use: Never used  Substance and Sexual Activity   Alcohol use: Yes    Comment: bottle of wine a  week-occ   Drug use: No   Sexual activity: Not Currently    Birth control/protection: Surgical  Other Topics Concern   Not on file  Social History Narrative   Not on file   Social Determinants of Health   Financial Resource Strain: Not on file  Food Insecurity: Food Insecurity Present (03/12/2022)   Hunger Vital Sign    Worried About Running Out of Food in the Last Year: Often true    Ran Out of Food in the Last Year: Often true  Transportation Needs: No Transportation Needs (03/12/2022)   PRAPARE - TraHydrologistedical): No    Lack of Transportation (Non-Medical): No  Recent Concern: Transportation Needs - Unmet Transportation Needs (03/12/2022)   PRAPARE - Transportation    Lack of Transportation (Medical): Yes    Lack of Transportation (Non-Medical): Yes  Physical Activity: Inactive (04/09/2022)   Exercise Vital Sign    Days of Exercise per Week: 0 days  Minutes of Exercise per Session: 0 min  Stress: Not on file  Social Connections: Not on file  Intimate Partner Violence: Not At Risk (03/12/2022)   Humiliation, Afraid, Rape, and Kick questionnaire    Fear of Current or Ex-Partner: No    Emotionally Abused: No    Physically Abused: No    Sexually Abused: No    Family History:  Family History  Problem Relation Age of Onset   Hypertension Mother    Coronary artery disease Mother    Hypertension Father    Stroke Father    Atrial fibrillation Father    Heart failure Maternal Grandfather    Heart attack Maternal Grandfather    She *** any female cancers, bleeding or blood clotting disorders.   Health Maintenance:  Mammogram(s): {YES GQ:4175516 Date: *** Colonoscopy: {YES GQ:4175516 Date: *** Flu shot UTD:  {YES/NO/NOT APPLICABLE:20182}  Medications Chyanne C. Seto had no medications administered during this visit. Current Outpatient Medications  Medication Sig Dispense Refill   albuterol (VENTOLIN HFA) 108 (90 Base) MCG/ACT  inhaler INHALE 2 PUFFS INTO THE LUNGS EVERY 4 HOURS AS NEEDED FOR WHEEZING OR SHORTNESS OR BREATH 18 g 0   amLODipine (NORVASC) 10 MG tablet Take 1 tablet (10 mg total) by mouth daily. 90 tablet 2   cetirizine (ZYRTEC ALLERGY) 10 MG tablet Take 1 tablet (10 mg total) by mouth 2 (two) times daily. 60 tablet 5   chlorthalidone (HYGROTON) 25 MG tablet Take 1 tablet (25 mg total) by mouth daily. 90 tablet 3   clonazePAM (KLONOPIN) 1 MG tablet Take 1 tablet (1 mg total) by mouth 3 (three) times daily as needed for anxiety (anxiety). 90 tablet 0   cloNIDine (CATAPRES - DOSED IN MG/24 HR) 0.3 mg/24hr patch Place 1 patch (0.3 mg total) onto the skin once a week. 4 patch 2   famotidine (PEPCID) 20 MG tablet Take 1 tablet (20 mg total) by mouth 2 (two) times daily. 60 tablet 5   FLUoxetine (PROZAC) 10 MG capsule Take 1 capsule (10 mg total) by mouth daily for 7 days, THEN 2 capsules (20 mg total) daily for 7 days, THEN 3 capsules (30 mg total) daily for 7 days, THEN 4 capsules (40 mg total) daily for 20 days. 122 capsule 0   gabapentin (NEURONTIN) 300 MG capsule Take 1 capsule (300 mg total) by mouth 2 (two) times daily. 60 capsule 0   levothyroxine (SYNTHROID) 125 MCG tablet Take 1 tablet (125 mcg total) by mouth daily before breakfast. 45 tablet 3   lurasidone (LATUDA) 20 MG TABS tablet Take 1 tablet (20 mg total) by mouth daily. Take with food (dinner) 30 tablet 1   Prenatal Multivit-Min-Fe-FA (PRE-NATAL PO) Take 1 tablet by mouth daily.     propranolol (INDERAL) 20 MG tablet Take 1 tablet (20 mg total) by mouth 2 (two) times daily as needed (for anxiety). 60 tablet 1   SYMBICORT 80-4.5 MCG/ACT inhaler Inhale 2 puffs into the lungs 2 (two) times daily.     valsartan (DIOVAN) 320 MG tablet Take 1 tablet (320 mg total) by mouth daily. 90 tablet 3   venlafaxine XR (EFFEXOR XR) 37.5 MG 24 hr capsule Take 1 capsule (37.5 mg total) by mouth daily. Take with 75 mg for a total of 112.5 mg for 7 days before  decreasing to 75 mg daily for 7 days and then decrease to 37.5 mg for 7 days before discontinuing 14 capsule 0   EPINEPHrine 0.3 mg/0.3 mL IJ SOAJ injection  Inject 0.3 mg into the muscle as needed for anaphylaxis. (Patient not taking: Reported on 04/15/2022) 1 each 2   hydrOXYzine (ATARAX) 25 MG tablet Take 1 tablet (25 mg total) by mouth every 6 (six) hours. (Patient not taking: Reported on 04/15/2022) 12 tablet 0   ondansetron (ZOFRAN-ODT) 4 MG disintegrating tablet Take 1 tablet (4 mg total) by mouth every 8 (eight) hours as needed for nausea or vomiting. (Patient not taking: Reported on 04/15/2022) 20 tablet 0   opium-belladonna (B&O SUPPRETTES) 16.2-60 MG suppository Place 1 suppository rectally as needed for bladder spasms. (Patient not taking: Reported on 04/15/2022) 12 suppository 0   oxyCODONE (ROXICODONE) 5 MG immediate release tablet Take 1 tablet (5 mg total) by mouth every 4 (four) hours as needed for severe pain. 30 tablet 0   spironolactone (ALDACTONE) 25 MG tablet Take 1 tablet (25 mg total) by mouth daily. (Patient not taking: Reported on 04/15/2022) 90 tablet 3   No current facility-administered medications for this visit.    Allergies Coreg [carvedilol], Other, Red dye, Compazine [prochlorperazine maleate], Vicodin [hydrocodone-acetaminophen], Amitriptyline hcl, Duloxetine hcl, Lisinopril, Naproxen, Reglan [metoclopramide], and Nifedipine   Physical Exam:  BP (!) 139/99   Pulse 80   Ht '5\' 11"'$  (1.803 m)   Wt 238 lb 8 oz (108.2 kg)   LMP 04/13/2022 (Exact Date)   Breastfeeding No   BMI 33.26 kg/m  Body mass index is 33.26 kg/m. Weight last year: *** General appearance: Well nourished, well developed female in no acute distress.  Neck:  Supple, normal appearance, and no thyromegaly  Cardiovascular: normal s1 and s2.  No murmurs, rubs or gallops. Respiratory:  Clear to auscultation bilateral. Normal respiratory effort Abdomen: positive bowel sounds and no masses, hernias;  diffusely non tender to palpation, non distended Breasts: {pe breast exam:315056::"breasts appear normal, no suspicious masses, no skin or nipple changes or axillary nodes"}. Neuro/Psych:  Normal mood and affect.  Skin:  Warm and dry.  Lymphatic:  No inguinal lymphadenopathy.   Pelvic exam: {ACTION; IS/IS VG:4697475 limited by body habitus EGBUS: within normal limits Vagina: within normal limits and with {no/min/mod:60509} blood or discharge in the vault Cervix: normal appearing cervix without tenderness, discharge or lesions. IUD strings *** Uterus:  {Desc; uterus-size & shape:16618} and non tender Adnexa:  {exam; adnexa:12223} Rectovaginal: ***  Laboratory: ***  Radiology: ***  Assessment: ***  Plan: *** 1. Pelvic pain *** - Cervicovaginal ancillary only( [place])  2. Hemorrhagic cyst of left ovary ***  No orders of the defined types were placed in this encounter.   RTC ***  Durene Romans MD Attending Center for [facility])

## 2022-04-15 NOTE — Progress Notes (Signed)
Lmp 12/10 4-5d  Spotting January  10d period.

## 2022-04-16 ENCOUNTER — Telehealth: Payer: Self-pay | Admitting: Cardiology

## 2022-04-16 ENCOUNTER — Emergency Department (HOSPITAL_COMMUNITY): Payer: Self-pay

## 2022-04-16 ENCOUNTER — Telehealth: Payer: Self-pay | Admitting: Obstetrics and Gynecology

## 2022-04-16 ENCOUNTER — Other Ambulatory Visit: Payer: Self-pay

## 2022-04-16 ENCOUNTER — Emergency Department (HOSPITAL_COMMUNITY)
Admission: EM | Admit: 2022-04-16 | Discharge: 2022-04-16 | Disposition: A | Discharge status: Home / Self Care | Payer: Self-pay | Attending: Emergency Medicine | Admitting: Emergency Medicine

## 2022-04-16 DIAGNOSIS — I10 Essential (primary) hypertension: Secondary | ICD-10-CM | POA: Insufficient documentation

## 2022-04-16 DIAGNOSIS — R112 Nausea with vomiting, unspecified: Secondary | ICD-10-CM | POA: Insufficient documentation

## 2022-04-16 DIAGNOSIS — Z79899 Other long term (current) drug therapy: Secondary | ICD-10-CM | POA: Insufficient documentation

## 2022-04-16 DIAGNOSIS — N83202 Unspecified ovarian cyst, left side: Secondary | ICD-10-CM | POA: Insufficient documentation

## 2022-04-16 LAB — LIPASE, BLOOD: Lipase: 31 U/L (ref 11–51)

## 2022-04-16 LAB — RAPID URINE DRUG SCREEN, HOSP PERFORMED
Amphetamines: NOT DETECTED
Barbiturates: NOT DETECTED
Benzodiazepines: NOT DETECTED
Cocaine: NOT DETECTED
Opiates: POSITIVE — AB
Tetrahydrocannabinol: POSITIVE — AB

## 2022-04-16 LAB — CBC WITH DIFFERENTIAL/PLATELET
Abs Immature Granulocytes: 0.02 10*3/uL (ref 0.00–0.07)
Basophils Absolute: 0 10*3/uL (ref 0.0–0.1)
Basophils Relative: 0 %
Eosinophils Absolute: 0 10*3/uL (ref 0.0–0.5)
Eosinophils Relative: 0 %
HCT: 40.3 % (ref 36.0–46.0)
Hemoglobin: 13.8 g/dL (ref 12.0–15.0)
Immature Granulocytes: 0 %
Lymphocytes Relative: 38 %
Lymphs Abs: 2 10*3/uL (ref 0.7–4.0)
MCH: 32.2 pg (ref 26.0–34.0)
MCHC: 34.2 g/dL (ref 30.0–36.0)
MCV: 93.9 fL (ref 80.0–100.0)
Monocytes Absolute: 0.6 10*3/uL (ref 0.1–1.0)
Monocytes Relative: 11 %
Neutro Abs: 2.6 10*3/uL (ref 1.7–7.7)
Neutrophils Relative %: 51 %
Platelets: 306 10*3/uL (ref 150–400)
RBC: 4.29 MIL/uL (ref 3.87–5.11)
RDW: 12 % (ref 11.5–15.5)
WBC: 5.2 10*3/uL (ref 4.0–10.5)
nRBC: 0 % (ref 0.0–0.2)

## 2022-04-16 LAB — COMPREHENSIVE METABOLIC PANEL
ALT: 38 U/L (ref 0–44)
AST: 35 U/L (ref 15–41)
Albumin: 4.1 g/dL (ref 3.5–5.0)
Alkaline Phosphatase: 48 U/L (ref 38–126)
Anion gap: 12 (ref 5–15)
BUN: 8 mg/dL (ref 6–20)
CO2: 25 mmol/L (ref 22–32)
Calcium: 9.4 mg/dL (ref 8.9–10.3)
Chloride: 103 mmol/L (ref 98–111)
Creatinine, Ser: 0.96 mg/dL (ref 0.44–1.00)
GFR, Estimated: >60 mL/min (ref >60)
Glucose, Bld: 92 mg/dL (ref 70–99)
Potassium: 3.8 mmol/L (ref 3.5–5.1)
Sodium: 140 mmol/L (ref 135–145)
Total Bilirubin: 0.6 mg/dL (ref 0.3–1.2)
Total Protein: 6.9 g/dL (ref 6.5–8.1)

## 2022-04-16 LAB — CERVICOVAGINAL ANCILLARY ONLY
Bacterial Vaginitis (gardnerella): NEGATIVE
Candida Glabrata: NEGATIVE
Candida Vaginitis: NEGATIVE
Chlamydia: NEGATIVE
Comment: NEGATIVE
Comment: NEGATIVE
Comment: NEGATIVE
Comment: NEGATIVE
Comment: NEGATIVE
Comment: NORMAL
Neisseria Gonorrhea: NEGATIVE
Trichomonas: NEGATIVE

## 2022-04-16 LAB — URINALYSIS, ROUTINE W REFLEX MICROSCOPIC
Bacteria, UA: NONE SEEN
Bilirubin Urine: NEGATIVE
Glucose, UA: NEGATIVE mg/dL
Hgb urine dipstick: NEGATIVE
Ketones, ur: NEGATIVE mg/dL
Leukocytes,Ua: NEGATIVE
Nitrite: NEGATIVE
Protein, ur: NEGATIVE mg/dL
Specific Gravity, Urine: 1.008 (ref 1.005–1.030)
pH: 8 (ref 5.0–8.0)

## 2022-04-16 LAB — I-STAT BETA HCG BLOOD, ED (MC, WL, AP ONLY): I-stat hCG, quantitative: <5 m[IU]/mL (ref <5)

## 2022-04-16 LAB — ETHANOL: Alcohol, Ethyl (B): <10 mg/dL (ref <10)

## 2022-04-16 MED ORDER — PROMETHAZINE HCL 25 MG/ML IJ SOLN
12.5000 mg | Freq: Once | INTRAMUSCULAR | Status: AC
  Administered 2022-04-16: 12.5 mg via INTRAMUSCULAR
  Filled 2022-04-16: qty 1

## 2022-04-16 MED ORDER — MORPHINE SULFATE (PF) 4 MG/ML IV SOLN
8.0000 mg | Freq: Once | INTRAVENOUS | Status: AC
  Administered 2022-04-16: 8 mg via INTRAMUSCULAR
  Filled 2022-04-16: qty 2

## 2022-04-16 MED ORDER — OXYCODONE HCL 10 MG PO TABS: 10.0000 mg | ORAL_TABLET | ORAL | 0 refills | Status: DC | PRN

## 2022-04-16 MED ORDER — SODIUM CHLORIDE 0.9 % IV BOLUS
1000.0000 mL | Freq: Once | INTRAVENOUS | Status: AC
  Administered 2022-04-16: 1000 mL via INTRAVENOUS

## 2022-04-16 MED ORDER — PROMETHAZINE HCL 25 MG RE SUPP: 25.0000 mg | Freq: Four times a day (QID) | RECTAL | 0 refills | Status: DC | PRN

## 2022-04-16 MED ORDER — MORPHINE SULFATE (PF) 10 MG/ML IV SOLN
10.0000 mg | Freq: Once | INTRAVENOUS | Status: AC
  Administered 2022-04-16: 10 mg via INTRAMUSCULAR
  Filled 2022-04-16: qty 1

## 2022-04-16 MED ORDER — HYDRALAZINE HCL 20 MG/ML IJ SOLN
5.0000 mg | Freq: Once | INTRAMUSCULAR | Status: AC
  Administered 2022-04-16: 5 mg via INTRAVENOUS
  Filled 2022-04-16: qty 1

## 2022-04-16 MED ORDER — ONDANSETRON HCL 4 MG/2ML IJ SOLN
4.0000 mg | Freq: Once | INTRAMUSCULAR | Status: AC
  Administered 2022-04-16: 4 mg via INTRAVENOUS
  Filled 2022-04-16: qty 2

## 2022-04-16 MED ORDER — MORPHINE SULFATE (PF) 4 MG/ML IV SOLN
4.0000 mg | Freq: Once | INTRAVENOUS | Status: AC
  Administered 2022-04-16: 4 mg via INTRAVENOUS
  Filled 2022-04-16: qty 1

## 2022-04-16 NOTE — ED Provider Notes (Signed)
[facility] Provider Note   CSN: [id number] Arrival date & time: 04/16/22  1505     History  Chief Complaint  Patient presents with   Abdominal Pain    Krystal [name] is a 32 y.o. female history of hypertension, retinal vein thrombosis, here presenting with worsening left lower quadrant pain.  Patient was seen here 3 days ago for abdominal pain.  She had extensive workup and it showed left ovarian cyst.  Patient states that she has worsening pain despite being on pain medicine at home.  Patient also has been throwing up her blood pressure medicines.  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  Patient states that her blurry vision is stable.  Patient called her cardiologist and was sent here for further evaluation.  The history is provided by the patient.       Home Medications Prior to Admission medications   Medication Sig Start Date End Date Taking? Authorizing Provider  albuterol (VENTOLIN HFA) 108 (90 Base) MCG/ACT inhaler INHALE 2 PUFFS INTO THE LUNGS EVERY 4 HOURS AS NEEDED FOR WHEEZING OR SHORTNESS OR BREATH 04/14/22   Garnet Sierras, DO  amLODipine (NORVASC) 10 MG tablet Take 1 tablet (10 mg total) by mouth daily. 05/31/21   Freada Bergeron, MD  cetirizine (ZYRTEC ALLERGY) 10 MG tablet Take 1 tablet (10 mg total) by mouth 2 (two) times daily. 03/26/21   Garnet Sierras, DO  chlorthalidone (HYGROTON) 25 MG tablet Take 1 tablet (25 mg total) by mouth daily. 04/09/22   Skeet Latch, MD  clonazePAM (KLONOPIN) 1 MG tablet Take 1 tablet (1 mg total) by mouth 3 (three) times daily as needed for anxiety (anxiety). 04/08/22 04/08/23  Vista Mink, MD  cloNIDine (CATAPRES - DOSED IN MG/24 HR) 0.3 mg/24hr patch Place 1 patch (0.3 mg total) onto the skin once a week. 03/20/22   Mariel Aloe, MD  EPINEPHrine 0.3 mg/0.3 mL IJ SOAJ injection Inject 0.3 mg  into the muscle as needed for anaphylaxis. Patient not taking: Reported on 04/15/2022 01/15/21   Garnet Sierras, DO  famotidine (PEPCID) 20 MG tablet Take 1 tablet (20 mg total) by mouth 2 (two) times daily. 12/04/21   Garnet Sierras, DO  FLUoxetine (PROZAC) 10 MG capsule Take 1 capsule (10 mg total) by mouth daily for 7 days, THEN 2 capsules (20 mg total) daily for 7 days, THEN 3 capsules (30 mg total) daily for 7 days, THEN 4 capsules (40 mg total) daily for 20 days. 04/08/22 05/19/22  Vista Mink, MD  gabapentin (NEURONTIN) 300 MG capsule Take 1 capsule (300 mg total) by mouth 2 (two) times daily. 03/17/22 04/16/22  Mariel Aloe, MD  hydrOXYzine (ATARAX) 25 MG tablet Take 1 tablet (25 mg total) by mouth every 6 (six) hours. Patient not taking: Reported on 04/15/2022 04/13/22   Tretha Sciara, MD  levothyroxine (SYNTHROID) 125 MCG tablet Take 1 tablet (125 mcg total) by mouth daily before breakfast. 05/24/21   Philemon Kingdom, MD  lurasidone (LATUDA) 20 MG TABS tablet Take 1 tablet (20 mg total) by mouth daily. Take with food (dinner) 04/08/22   Vista Mink, MD  ondansetron (ZOFRAN-ODT) 4 MG disintegrating tablet Take 1 tablet (4 mg total) by mouth every 8 (eight) hours as needed for nausea or vomiting. Patient not taking: Reported on 04/15/2022 03/02/22   Rex Kras, PA  opium-belladonna (B&O SUPPRETTES) 16.2-60 MG suppository Place 1 suppository rectally as needed for bladder spasms. Patient not taking: Reported on 04/15/2022 04/12/22   Suella Broad A, PA-C  oxyCODONE (ROXICODONE) 5 MG immediate release tablet Take 1 tablet (5 mg total) by mouth every 4 (four) hours as needed for severe pain. 04/15/22   Aletha Halim, MD  Prenatal Multivit-Min-Fe-FA (PRE-NATAL PO) Take 1 tablet by mouth daily.    [provider]  propranolol (INDERAL) 20 MG tablet Take 1 tablet (20 mg total) by mouth 2 (two) times daily as needed (for anxiety). 04/08/22   Vista Mink, MD  spironolactone (ALDACTONE) 25 MG  tablet Take 1 tablet (25 mg total) by mouth daily. Patient not taking: Reported on 04/15/2022 09/19/21   Freada Bergeron, MD  SYMBICORT 80-4.5 MCG/ACT inhaler Inhale 2 puffs into the lungs 2 (two) times daily.    [provider]  valsartan (DIOVAN) 320 MG tablet Take 1 tablet (320 mg total) by mouth daily. 04/09/22   Skeet Latch, MD  venlafaxine XR (EFFEXOR XR) 37.5 MG 24 hr capsule Take 1 capsule (37.5 mg total) by mouth daily. Take with 75 mg for a total of 112.5 mg for 7 days before decreasing to 75 mg daily for 7 days and then decrease to 37.5 mg for 7 days before discontinuing 04/08/22 04/08/23  Vista Mink, MD      Allergies    Coreg [carvedilol], Other, Red dye, Compazine [prochlorperazine maleate], Vicodin [hydrocodone-acetaminophen], Amitriptyline hcl, Duloxetine hcl, Lisinopril, Naproxen, Labetalol, Metoclopramide, Nifedipine, and Prochlorperazine    Review of Systems   Review of Systems  Gastrointestinal:  Positive for abdominal pain.  All other systems reviewed and are negative.   Physical Exam Updated Vital Signs BP (!) 160/107   Pulse (!) 56   Temp 98.1 F (36.7 C)   Resp 13   LMP 04/13/2022 (Exact Date)   SpO2 99%  Physical Exam Vitals and nursing note reviewed.  Constitutional:      Comments: Uncomfortable  HENT:     Head: Normocephalic.     Mouth/Throat:     Mouth: Mucous membranes are moist.  Eyes:     Extraocular Movements: Extraocular movements intact.     Pupils: Pupils are equal, round, and reactive to light.  Cardiovascular:     Rate and Rhythm: Normal rate and regular rhythm.  Pulmonary:     Effort: Pulmonary effort is normal.     Breath sounds: Normal breath sounds.  Abdominal:     General: Abdomen is flat.     Comments: Mild left lower quadrant tenderness  Skin:    General: Skin is warm.     Capillary Refill: Capillary refill takes less than 2 seconds.  Neurological:     General: No focal deficit present.     Mental Status:  She is alert and oriented to person, place, and time.  Psychiatric:        Mood and Affect: Mood normal.        Behavior: Behavior normal.     ED Results / Procedures / Treatments   Labs (all labs ordered are listed, but only abnormal results are displayed) Labs Reviewed  RAPID URINE DRUG SCREEN, HOSP PERFORMED - Abnormal; Notable for the following components:      Result Value   Opiates POSITIVE (*)    Tetrahydrocannabinol POSITIVE (*)    All other components within normal limits  CBC WITH DIFFERENTIAL/PLATELET  ETHANOL  COMPREHENSIVE METABOLIC PANEL  LIPASE, BLOOD  URINALYSIS,  ROUTINE W REFLEX MICROSCOPIC  I-STAT BETA HCG BLOOD, ED ([facility], [facility], [facility] ONLY)    EKG None  Radiology US Pelvis Complete  Result Date: 04/16/2022 CLINICAL DATA:  Pelvic pain for 2 weeks. History of left ovarian cyst rule out torsion. EXAM: TRANSABDOMINAL AND TRANSVAGINAL ULTRASOUND OF PELVIS DOPPLER ULTRASOUND OF OVARIES TECHNIQUE: Both transabdominal and transvaginal ultrasound examinations of the pelvis were performed. Transabdominal technique was performed for global imaging of the pelvis including uterus, ovaries, adnexal regions, and pelvic cul-de-sac. It was necessary to proceed with endovaginal exam following the transabdominal exam to visualize the ovaries and endometrium. Color and duplex Doppler ultrasound was utilized to evaluate blood flow to the ovaries. COMPARISON:  04/13/2022 FINDINGS: Uterus Measurements: 7.6 x 3.7 x 5.9 cm = volume: 87 mL. Uterus is anteverted. No myometrial mass lesions. Endometrium Thickness: 9 mm.  No focal abnormality visualized. Right ovary Measurements: 2.6 x 1.4 x 1.9 cm = volume: 4 mL. Normal appearance/no adnexal mass. Left ovary Measurements: 6.7 x 4.7 x 5.2 cm = volume: 87 mL. Left ovarian cyst is again demonstrated, measuring 4.9 x 6 x 4.6 cm. Asymmetric nodular area along the cyst wall. No flow is demonstrated suggesting probably layering debris or internal hemorrhage.  Appearance is unchanged since prior study. Size measurements are similar. Pulsed Doppler evaluation of both ovaries demonstrates normal low-resistance arterial and venous waveforms. Other findings Minimal free fluid seen in the left adnexal region. IMPRESSION: Again demonstrated is a 6 cm max diameter left ovarian cyst with internal debris. This is unchanged in appearance since prior study, probably representing a hemorrhagic cyst. As previously recommended, ultrasound in 6 weeks is suggested to ensure resolution. No evidence of ovarian torsion. Electronically Signed   By: Lucienne Capers M.D.   On: 04/16/2022 19:11   US Transvaginal Non-OB  Result Date: 04/16/2022 CLINICAL DATA:  Pelvic pain for 2 weeks. History of left ovarian cyst rule out torsion. EXAM: TRANSABDOMINAL AND TRANSVAGINAL ULTRASOUND OF PELVIS DOPPLER ULTRASOUND OF OVARIES TECHNIQUE: Both transabdominal and transvaginal ultrasound examinations of the pelvis were performed. Transabdominal technique was performed for global imaging of the pelvis including uterus, ovaries, adnexal regions, and pelvic cul-de-sac. It was necessary to proceed with endovaginal exam following the transabdominal exam to visualize the ovaries and endometrium. Color and duplex Doppler ultrasound was utilized to evaluate blood flow to the ovaries. COMPARISON:  04/13/2022 FINDINGS: Uterus Measurements: 7.6 x 3.7 x 5.9 cm = volume: 87 mL. Uterus is anteverted. No myometrial mass lesions. Endometrium Thickness: 9 mm.  No focal abnormality visualized. Right ovary Measurements: 2.6 x 1.4 x 1.9 cm = volume: 4 mL. Normal appearance/no adnexal mass. Left ovary Measurements: 6.7 x 4.7 x 5.2 cm = volume: 87 mL. Left ovarian cyst is again demonstrated, measuring 4.9 x 6 x 4.6 cm. Asymmetric nodular area along the cyst wall. No flow is demonstrated suggesting probably layering debris or internal hemorrhage. Appearance is unchanged since prior study. Size measurements are similar.  Pulsed Doppler evaluation of both ovaries demonstrates normal low-resistance arterial and venous waveforms. Other findings Minimal free fluid seen in the left adnexal region. IMPRESSION: Again demonstrated is a 6 cm max diameter left ovarian cyst with internal debris. This is unchanged in appearance since prior study, probably representing a hemorrhagic cyst. As previously recommended, ultrasound in 6 weeks is suggested to ensure resolution. No evidence of ovarian torsion. Electronically Signed   By: Lucienne Capers M.D.   On: 04/16/2022 19:11   [place] Art/Ven Flow Abd Pelv Doppler  Result Date:  04/16/2022 CLINICAL DATA:  Pelvic pain for 2 weeks. History of left ovarian cyst rule out torsion. EXAM: TRANSABDOMINAL AND TRANSVAGINAL ULTRASOUND OF PELVIS DOPPLER ULTRASOUND OF OVARIES TECHNIQUE: Both transabdominal and transvaginal ultrasound examinations of the pelvis were performed. Transabdominal technique was performed for global imaging of the pelvis including uterus, ovaries, adnexal regions, and pelvic cul-de-sac. It was necessary to proceed with endovaginal exam following the transabdominal exam to visualize the ovaries and endometrium. Color and duplex Doppler ultrasound was utilized to evaluate blood flow to the ovaries. COMPARISON:  04/13/2022 FINDINGS: Uterus Measurements: 7.6 x 3.7 x 5.9 cm = volume: 87 mL. Uterus is anteverted. No myometrial mass lesions. Endometrium Thickness: 9 mm.  No focal abnormality visualized. Right ovary Measurements: 2.6 x 1.4 x 1.9 cm = volume: 4 mL. Normal appearance/no adnexal mass. Left ovary Measurements: 6.7 x 4.7 x 5.2 cm = volume: 87 mL. Left ovarian cyst is again demonstrated, measuring 4.9 x 6 x 4.6 cm. Asymmetric nodular area along the cyst wall. No flow is demonstrated suggesting probably layering debris or internal hemorrhage. Appearance is unchanged since prior study. Size measurements are similar. Pulsed Doppler evaluation of both ovaries demonstrates normal  low-resistance arterial and venous waveforms. Other findings Minimal free fluid seen in the left adnexal region. IMPRESSION: Again demonstrated is a 6 cm max diameter left ovarian cyst with internal debris. This is unchanged in appearance since prior study, probably representing a hemorrhagic cyst. As previously recommended, ultrasound in 6 weeks is suggested to ensure resolution. No evidence of ovarian torsion. Electronically Signed   By: Lucienne Capers M.D.   On: 04/16/2022 19:11    Procedures Procedures    Medications Ordered in ED Medications  morphine (PF) 4 MG/ML injection 4 mg (4 mg Intravenous Given 04/16/22 1800)  ondansetron (ZOFRAN) injection 4 mg (4 mg Intravenous Given 04/16/22 1758)  sodium chloride 0.9 % bolus 1,000 mL (0 mLs Intravenous Stopped 04/16/22 1830)  hydrALAZINE (APRESOLINE) injection 5 mg (5 mg Intravenous Given 04/16/22 1800)  morphine (PF) 4 MG/ML injection 8 mg (8 mg Intramuscular Given 04/16/22 1925)    ED Course/ Medical Decision Making/ A&P                             Medical Decision Making Krystal [name] is a 32 y.o. female here presenting with left lower quadrant tenderness and suprapubic pain.  Concern for possible torsion versus interstitial cystitis versus UTI.  Patient is difficult IV stick.  Will attempt ultrasound IV and give pain medicine and IV BP meds  10:04 PM Unfortunately her IV ultrasound infiltrated.  I was able to obtain labs and they were unremarkable.  Patient received multiple doses of IM meds and felt better.  No vomiting in the ED and pain is controlled.  Blood pressure improved to the 130s.  Patient's ultrasound shows stable 6 cm left ovarian cyst with no torsion.  Patient will be discharged home with pain medicine and she has OB/GYN follow-up.  Problems Addressed: Cyst of left ovary: acute illness or injury Hypertension, unspecified type: acute illness or injury Nausea and vomiting, unspecified vomiting type: acute illness or  injury  Amount and/or Complexity of Data Reviewed Labs: ordered. Radiology: ordered.  Risk Prescription drug management.    Final Clinical Impression(s) / ED Diagnoses Final diagnoses:  None    Rx / DC Orders ED Discharge Orders     None         Shirlyn Goltz  Hsient[place], MD 04/16/22 2212

## 2022-04-16 NOTE — Discharge Instructions (Addendum)
You have ovarian cyst and I doubled your dose of pain medicine to help you with the pain.  I also have ordered Phenergan suppository for nausea  You need to call your GYN doctor about follow-up regarding your ovarian cyst  Please take your blood pressure medicine as prescribed by your doctor  Return to ER if you have worse blurry vision or abdominal pain or vomiting or fever

## 2022-04-16 NOTE — Telephone Encounter (Signed)
Pt c/o BP issue: STAT if pt c/o blurred vision, one-sided weakness or slurred speech  1. What are your last 5 BP readings? 172/ 115158/107,162/112  2. Are you having any other symptoms (ex. Dizziness, headache, blurred vision, passed out)? Vision is blurred, dizziness and confusion and pain  3. What is your BP issue? High blood pressure

## 2022-04-16 NOTE — Telephone Encounter (Signed)
Patient stated her blood pressure is 172/115 the vision in her left eye is blurry and she lost vision in her right eye. She does experience dizziness however, it has worsen, lot of pain bladder pain and disoriented. Advised patient to go to the nearest ER or call 911. Pt. Stated her husband will take her to the ER, offered to call 911 on her behalf and patient stated no one is here to watch her children. Offered to stay on the phone with the patient until her husband arrives, patient declined. Reiterated the importance of immediately seeking emergency care or calling 911. Patient voiced understanding. Will forward to MD and nurse.

## 2022-04-16 NOTE — Telephone Encounter (Signed)
she is on the phone crying because she is in so much pain. she said the pharmacy won't fill her RX until someone call the pharmacy.

## 2022-04-16 NOTE — ED Provider Triage Note (Signed)
Emergency Medicine Provider Triage Evaluation Note  Krystal [name] , a 32 y.o. female  was evaluated in triage.  Pt complains of abdominal pain that has been going on for 5 days with associated nausea and vomiting.  Patient having trouble keeping p.o. intake down including her blood pressure medications.  States her blood pressure elevated at home in the 170s.  States the only thing that helped her blood pressure was clonidine patch.  Denies fever reports cold/chills.  Patient also reports blurry vision on both eyes.  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 Reports seeing flash and blurred vision on the R eye.  Review of Systems  Positive: As above Negative: As above  Physical Exam  BP 108/77 (BP Location: Left Arm)   Pulse (!) 103   Temp 98.1 F (36.7 C) (Oral)   Resp 18   LMP 04/13/2022 (Exact Date)   SpO2 92%  Gen:   Awake, no distress   Resp:  Normal effort  MSK:   Moves extremities without difficulty  Other:    Medical Decision Making  Medically screening exam initiated at 4:02 PM.  Appropriate orders placed.  Joesphine C [name] was informed that the remainder of the evaluation will be completed by another provider, this initial triage assessment does not replace that evaluation, and the importance of remaining in the ED until their evaluation is complete.    Rex Kras, [place] 04/17/22 [phone]

## 2022-04-16 NOTE — ED Triage Notes (Signed)
LLQ Abd pain with n/v, blurred vision bilaterally with hypertension with max 172/115 @ home.  Headache with relief after taking tylenol  Patient reports unable to keep down bp meds x2 days due to n/v

## 2022-04-16 NOTE — ED Notes (Signed)
I attempted to collect labs 3 times and was unsuccessful.  I clicked off by accident.

## 2022-04-16 NOTE — Telephone Encounter (Addendum)
Spoke with pt who is crying and saying that she is about to return to the ED because she is in so much pain and cannot get her pain med filled. I advised pt to wait until I have spoken with the pharmacy. After speaking with the pharmacist Sheran Spine) @ [facility] [phone], I notified Dr. Ilda Basset that he will need to call and discuss the Rx with [facility].   Dr. Ilda Basset spoke with [facility] @ [place] earlier today and reported to me that the Rx for Oxycodone will be filled for pt.

## 2022-04-17 ENCOUNTER — Telehealth: Payer: Self-pay

## 2022-04-17 NOTE — Telephone Encounter (Signed)
Called patient, surgery date, time, location, and preop instructions given.

## 2022-04-18 ENCOUNTER — Other Ambulatory Visit: Payer: Self-pay | Admitting: Obstetrics and Gynecology

## 2022-04-18 ENCOUNTER — Encounter: Payer: Self-pay | Admitting: Obstetrics and Gynecology

## 2022-04-18 DIAGNOSIS — Z01818 Encounter for other preprocedural examination: Secondary | ICD-10-CM

## 2022-04-21 ENCOUNTER — Other Ambulatory Visit: Payer: Self-pay | Admitting: Obstetrics and Gynecology

## 2022-04-21 NOTE — Progress Notes (Signed)
When calling to go over instructions, the patient insisted she had already talked to someone about her health history and instructions for surgery. I asked if we could at least go over the instructions to make sure we all were on the same page. Instructions given and all questions addressed and answered.  Dr. Ilda Basset had mentioned in one of his notes he wanted pt to have cards clearance due to her complicated history. There was not one in her chart so I reached out to Dr. Ilda Basset via [place] and his response was:  "I spoke to Dr. Oval Linsey who saw her earlier this month and she said that she was fine for surgery without having to see her again. thanks for following up on that"  Chest x-ray - 03/06/22 EKG - 03/18/22 ECHO - 06/17/21  Anesthesia review: Spoke to [facility] about her.   Patient verbally denies any shortness of breath, fever, cough and chest pain during phone call   -------------  SDW INSTRUCTIONS given:  Your procedure is scheduled on 04/22/22.  Report to [facility] Main Entrance "A" at [place].M., and check in at the Admitting office.  Call this number if you have problems the morning of surgery:  [phone]   Remember:  Do not eat after midnight the night before your surgery  You may drink clear liquids until 0715 the morning of your surgery.   Clear liquids allowed are: Water, Non-Citrus Juices (without pulp), Carbonated Beverages, Clear Tea, Black Coffee Only, and Gatorade    Take these medicines the morning of surgery with A SIP OF WATER  amLODipine (NORVASC)  cloNIDine  famotidine (PEPCID)  FLUoxetine (PROZAC)  gabapentin (NEURONTIN)  levothyroxine (SYNTHROID)  SYMBICORT  venlafaxine XR (EFFEXOR XR)  acetaminophen (TYLENOL)-if needed albuterol (VENTOLIN HFA)-if needed (Please bring on the day of surgery) cetirizine (ZYRTEC ALLERGY)-if needed clonazePAM (KLONOPIN)-if needed BENADRYL-if needed oxyCODONE-if needed promethazine (PHENERGAN)-if  needed propranolol (INDERAL)-if needed sodium chloride (OCEAN)-if needed  As of today, STOP taking any Aspirin (unless otherwise instructed by your surgeon) Aleve, Naproxen, Ibuprofen, Motrin, Advil, Goody's, BC's, all herbal medications, fish oil, and all vitamins.                      Do not wear jewelry, make up, or nail polish            Do not wear lotions, powders, perfumes/colognes, or deodorant.            Do not shave 48 hours prior to surgery.  Men may shave face and neck.            Do not bring valuables to the hospital.            [facility] is not responsible for any belongings or valuables.  Do NOT Smoke (Tobacco/Vaping) 24 hours prior to your procedure If you use a CPAP at night, you may bring all equipment for your overnight stay.   Contacts, glasses, dentures or bridgework may not be worn into surgery.      For patients admitted to the hospital, discharge time will be determined by your treatment team.   Patients discharged the day of surgery will not be allowed to drive home, and someone needs to stay with them for 24 hours.    Special instructions:   [place]- Preparing For Surgery  Before surgery, you can play an important role. Because skin is not sterile, your skin needs to be as free of germs as possible. You can  reduce the number of germs on your skin by washing with CHG (chlorahexidine gluconate) Soap before surgery.  CHG is an antiseptic cleaner which kills germs and bonds with the skin to continue killing germs even after washing.    Oral Hygiene is also important to reduce your risk of infection.  Remember - BRUSH YOUR TEETH THE MORNING OF SURGERY WITH YOUR REGULAR TOOTHPASTE  Please do not use if you have an allergy to CHG or antibacterial soaps. If your skin becomes reddened/irritated stop using the CHG.  Do not shave (including legs and underarms) for at least 48 hours prior to first CHG shower. It is OK to shave your face.  Please follow these  instructions carefully.   Shower the NIGHT BEFORE SURGERY and the MORNING OF SURGERY with DIAL Soap.   Pat yourself dry with a CLEAN TOWEL.  Wear CLEAN PAJAMAS to bed the night before surgery  Place CLEAN SHEETS on your bed the night of your first shower and DO NOT SLEEP WITH PETS.   Day of Surgery: Please shower morning of surgery  Wear Clean/Comfortable clothing the morning of surgery Do not apply any deodorants/lotions.   Remember to brush your teeth WITH YOUR REGULAR TOOTHPASTE.   Questions were answered. Patient verbalized understanding of instructions.

## 2022-04-21 NOTE — H&P (Signed)
Obstetrics and Gynecology Surgical H&P   Surgery Date: 04/22/2022   OBGYN Clinic: [facility]   Primary Care Provider: Patient, No Pcp Per   Referring Provider: [facility]   Chief Complaint: Abdominal pain. 5-6cm hemorrhagic left ovarian cyst.   History of Present Illness: Krystal [name] is a 32 y.o.  [id number] (Patient's last menstrual period was 04/13/2022 (exact date).), seen for the above chief complaint. Her past medical history is significant for multiple medical co-morbidities including h/o c/s x 2 and BTL, h/o l/s chole, h/o nec fasc, hypertensive emergency, IC, migraines, hypothyroidism.    Patient seen by me on 2/20 for a new patient visit and for ED follow up. She was seen on 2/17 and 2/18 in the [facility] EDs for abdominal pain which she thought was a renal stone. She had a CT renal on 2/17 and it showed a 6cm LO hemorrhagic cyst, no free fluid in the pelvis, which was confirmed on u/s; cbc, cmp, u/a, UPT were all negative. She re-presented to the ED the next day with continued pain and u/s showed stable imaging; s/s are currently stable. They were helped with the oxycodone she received in the ED.    Patient's periods are qmonth, regular, 5 days but she only had spotting in January which she attribues due to inpatient admission from 1/10-22 for HTN control. She had a period this month and has some AUB since.  2/20 vaginal swab negative and options d/w her and she elected for surgery.  Patient seen on 2/21 in the ED for continued s/s and had a stable pelvic u/s, negative labs  I spoke to Dr. Oval Linsey (patient's cardiologist) on 2/23 and she stated the patient was fine for surgery without further workup   Review of Systems: Pertinent items noted in HPI and remainder of comprehensive ROS otherwise negative.        Patient Active Problem List    Diagnosis Date Noted   Hemorrhagic cyst of left ovary 04/15/2022   AKI (acute kidney  injury) ([place]) 03/16/2022   Hypothyroidism 03/11/2022   GERD with esophagitis 03/11/2022   Retinal vein occlusion of right eye 03/11/2022   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 03/05/2022   Moderate persistent asthma with acute exacerbation 12/04/2021   Cervicalgia 10/31/2021   Borderline personality disorder in adult [facility]) 10/21/2021   Intractable nausea and vomiting 07/05/2021   Anisocoria 07/02/2021   History of excision of intestinal structure 05/23/2021   Attention deficit disorder 04/17/2021   Chronic fatigue syndrome 04/17/2021   Anosmia 04/17/2021   Chronic urticaria 01/15/2021   Local reaction to hymenoptera sting 01/15/2021   Perennial allergic rhinitis 01/15/2021   MDD (major depressive disorder), recurrent episode, severe ([place]) 11/19/2020   Fibromyalgia 05/11/2020   Gastroesophageal reflux disease 05/11/2020   Interstitial cystitis 05/11/2020   PTSD (post-traumatic stress disorder) 05/11/2020   Primary insomnia 05/11/2020   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   Hypertensive urgency 05/08/2020   Hypothyroidism due to Hashimoto's thyroiditis 10/03/2019   Anxiety 06/16/2019   Obesity 06/07/2019   Lumbar spondylosis 06/23/2018   Sacroiliac inflammation ([place]) 06/23/2018   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   History of upper limb amputation, wrist, 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      Past Medical History:      Past Medical History:  Diagnosis Date   Acute acalculous cholecystitis s/p lap cholecystectomy 05/11/2020 05/11/2020   Angio-edema 01/15/2021   Anxiety     Asthma     Depression  HELLP (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  History of HELLP syndrome at 26 weeks 5 days (12/07/2017); admitted to [facility] with BP 200/120s, Plt 52, AST/ALT 471/375  APS  evaluation negative (04/2019)   Daily ASA 81 mg   Challenging blood pressure control in current pregnancy      Plan:  - Baseline pre-eclampsia labs this pregnancy within normal limits (Cr: 0.71; urine p:c 0.106)  -    IC (interstitial cystitis)     Kidney stones     Migraines     Necrotizing fasciitis ([place])      left hand amputation   PTSD (post-traumatic stress disorder)     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   Severe uncontrolled hypertension 10/08/2017   UTI (urinary tract infection)        Past Surgical History:       Past Surgical History:  Procedure Laterality Date   APPENDECTOMY       BIOPSY   05/10/2020    Procedure: BIOPSY;  Surgeon: Arta Silence, MD;  Location: [facility];  Service: Endoscopy;;   CESAREAN SECTION N/A 12/06/2017    Procedure: CESAREAN SECTION;  Surgeon: Sloan Leiter, MD;  Location: [facility];  Service: Obstetrics;  Laterality: N/A;   CESAREAN SECTION   07/21/2021    Procedure: CESAREAN SECTION;  Surgeon: Donnamae Jude, MD;  Location: [facility];  Service: Obstetrics;;   CHOLECYSTECTOMY N/A 05/11/2020    Procedure: SINGLE SITE LAPAROSCOPIC CHOLECYSTECTOMY AND LIVER BIOSY;  Surgeon: Michael Boston, MD;  Location: [facility];  Service: General;  Laterality: N/A;   ESOPHAGOGASTRODUODENOSCOPY (EGD) WITH PROPOFOL N/A 05/10/2020    Procedure: ESOPHAGOGASTRODUODENOSCOPY (EGD) WITH PROPOFOL;  Surgeon: Arta Silence, MD;  Location: [facility];  Service: Endoscopy;  Laterality: N/A;   hand amputation        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   HAND RECONSTRUCTION Right     INCISION AND DRAINAGE       NASAL SEPTUM SURGERY          Past Obstetrical History:                  OB History  Gravida Para Term Preterm AB Living  5 3 0 '3 2 2  '$ SAB IAB Ectopic Multiple Live Births     2 0 0 0 2        # Outcome Date GA Lbr Len/2nd Weight Sex Delivery Anes PTL Lv  5 Preterm 07/21/21 [redacted]w[redacted]d      CS-LTranv        4 SAB 08/2020                  3 Preterm 09/14/19  312w3d 4 lb 7 oz (2.013 kg) F CS-LTranv     LIV  2 Preterm 12/07/17 2619w5d1 lb 10.8 oz (0.76 kg) F CS-LTranv Gen   LIV     Birth Comments: ELBW  1 SAB                        Past Gynecological History: As per HPI. History of Pap Smear(s): Yes.   Last pap 07/2021, which was wnl and hpv negative   Social History:  Social History         Socioeconomic History  Marital status: Married      Spouse name: Not on file   Number of children: 3   Years of education: Not on file   Highest education level: 8th grade  Occupational History   Not on file  Tobacco Use   Smoking status: Never   Smokeless tobacco: Never   Tobacco comments:      She tried a cigarette and never smoked again  Vaping Use   Vaping Use: Never used  Substance and Sexual Activity   Alcohol use: Yes      Comment: bottle of wine a week-occ   Drug use: No   Sexual activity: Not Currently      Birth control/protection: Surgical  Other Topics Concern   Not on file  Social History Narrative   Not on file    Social Determinants of Health        Financial Resource Strain: Not on file  Food Insecurity: Food Insecurity Present (03/12/2022)    Hunger Vital Sign     Worried About Running Out of Food in the Last Year: Often true     Ran Out of Food in the Last Year: Often true  Transportation Needs: No Transportation Needs (03/12/2022)    PRAPARE - Armed forces logistics/support/administrative officer (Medical): No     Lack of Transportation (Non-Medical): No  Recent Concern: Transportation Needs - Unmet Transportation Needs (03/12/2022)    PRAPARE - Transportation     Lack of Transportation (Medical): Yes     Lack of Transportation (Non-Medical): Yes  Physical Activity: Inactive (04/09/2022)    Exercise Vital Sign     Days of Exercise per Week: 0 days     Minutes of Exercise per Session: 0 min  Stress: Not on file  Social Connections: Not on file  Intimate Partner Violence: Not At Risk (03/12/2022)    Humiliation, Afraid,  Rape, and Kick questionnaire     Fear of Current or Ex-Partner: No     Emotionally Abused: No     Physically Abused: No     Sexually Abused: No      Family History:       Family History  Problem Relation Age of Onset   Hypertension Mother     Coronary artery disease Mother     Hypertension Father     Stroke Father     Atrial fibrillation Father     Heart failure Maternal Grandfather     Heart attack Maternal Grandfather        Medications Catherina C. Infinger had no medications administered during this visit.       Current Outpatient Medications  Medication Sig Dispense Refill   albuterol (VENTOLIN HFA) 108 (90 Base) MCG/ACT inhaler INHALE 2 PUFFS INTO THE LUNGS EVERY 4 HOURS AS NEEDED FOR WHEEZING OR SHORTNESS OR BREATH 18 g 0   amLODipine (NORVASC) 10 MG tablet Take 1 tablet (10 mg total) by mouth daily. 90 tablet 2   cetirizine (ZYRTEC ALLERGY) 10 MG tablet Take 1 tablet (10 mg total) by mouth 2 (two) times daily. 60 tablet 5   chlorthalidone (HYGROTON) 25 MG tablet Take 1 tablet (25 mg total) by mouth daily. 90 tablet 3   clonazePAM (KLONOPIN) 1 MG tablet Take 1 tablet (1 mg total) by mouth 3 (three) times daily as needed for anxiety (anxiety). 90 tablet 0   cloNIDine (CATAPRES - DOSED IN MG/24 HR) 0.3 mg/24hr patch Place 1 patch (0.3 mg total) onto the skin once  a week. 4 patch 2   famotidine (PEPCID) 20 MG tablet Take 1 tablet (20 mg total) by mouth 2 (two) times daily. 60 tablet 5   FLUoxetine (PROZAC) 10 MG capsule Take 1 capsule (10 mg total) by mouth daily for 7 days, THEN 2 capsules (20 mg total) daily for 7 days, THEN 3 capsules (30 mg total) daily for 7 days, THEN 4 capsules (40 mg total) daily for 20 days. 122 capsule 0   gabapentin (NEURONTIN) 300 MG capsule Take 1 capsule (300 mg total) by mouth 2 (two) times daily. 60 capsule 0   levothyroxine (SYNTHROID) 125 MCG tablet Take 1 tablet (125 mcg total) by mouth daily before breakfast. 45 tablet 3   lurasidone (LATUDA)  20 MG TABS tablet Take 1 tablet (20 mg total) by mouth daily. Take with food (dinner) 30 tablet 1   Prenatal Multivit-Min-Fe-FA (PRE-NATAL PO) Take 1 tablet by mouth daily.       propranolol (INDERAL) 20 MG tablet Take 1 tablet (20 mg total) by mouth 2 (two) times daily as needed (for anxiety). 60 tablet 1   SYMBICORT 80-4.5 MCG/ACT inhaler Inhale 2 puffs into the lungs 2 (two) times daily.       valsartan (DIOVAN) 320 MG tablet Take 1 tablet (320 mg total) by mouth daily. 90 tablet 3   venlafaxine XR (EFFEXOR XR) 37.5 MG 24 hr capsule Take 1 capsule (37.5 mg total) by mouth daily. Take with 75 mg for a total of 112.5 mg for 7 days before decreasing to 75 mg daily for 7 days and then decrease to 37.5 mg for 7 days before discontinuing 14 capsule 0   EPINEPHrine 0.3 mg/0.3 mL IJ SOAJ injection Inject 0.3 mg into the muscle as needed for anaphylaxis. (Patient not taking: Reported on 04/15/2022) 1 each 2   hydrOXYzine (ATARAX) 25 MG tablet Take 1 tablet (25 mg total) by mouth every 6 (six) hours. (Patient not taking: Reported on 04/15/2022) 12 tablet 0   ondansetron (ZOFRAN-ODT) 4 MG disintegrating tablet Take 1 tablet (4 mg total) by mouth every 8 (eight) hours as needed for nausea or vomiting. (Patient not taking: Reported on 04/15/2022) 20 tablet 0   opium-belladonna (B&O SUPPRETTES) 16.2-60 MG suppository Place 1 suppository rectally as needed for bladder spasms. (Patient not taking: Reported on 04/15/2022) 12 suppository 0   oxyCODONE (ROXICODONE) 5 MG immediate release tablet Take 1 tablet (5 mg total) by mouth every 4 (four) hours as needed for severe pain. 30 tablet 0   spironolactone (ALDACTONE) 25 MG tablet Take 1 tablet (25 mg total) by mouth daily. (Patient not taking: Reported on 04/15/2022) 90 tablet 3    No current facility-administered medications for this visit.      Allergies Coreg [carvedilol], Other, Red dye, Compazine [prochlorperazine maleate], Vicodin [hydrocodone-acetaminophen],  Amitriptyline hcl, Duloxetine hcl, Lisinopril, Naproxen, Reglan [metoclopramide], and Nifedipine     Physical Exam:  BP (!) 139/99   Pulse 80   Ht '5\' 11"'$  (1.803 m)   Wt 238 lb 8 oz (108.2 kg)   LMP 04/13/2022 (Exact Date)   Breastfeeding No   BMI 33.26 kg/m  Body mass index is 33.26 kg/m. General appearance: Well nourished, well developed female in no acute distress. Patient tearful at times Cardiovascular: normal s1 and s2.  No murmurs, rubs or gallops. Respiratory:  Clear to auscultation bilateral. Normal respiratory effort Abdomen: soft, nd, minimally ttp in the llq, no peritoneal s/s.  Neuro/Psych:  Normal mood and affect.  Skin:  Warm and dry.    Laboratory: as per HPI.    Radiology:  Narrative & Impression  CLINICAL DATA:  Pelvic pain for 2 weeks. History of left ovarian cyst rule out torsion.   EXAM: TRANSABDOMINAL AND TRANSVAGINAL ULTRASOUND OF PELVIS   DOPPLER ULTRASOUND OF OVARIES   TECHNIQUE: Both transabdominal and transvaginal ultrasound examinations of the pelvis were performed. Transabdominal technique was performed for global imaging of the pelvis including uterus, ovaries, adnexal regions, and pelvic cul-de-sac.   It was necessary to proceed with endovaginal exam following the transabdominal exam to visualize the ovaries and endometrium. Color and duplex Doppler ultrasound was utilized to evaluate blood flow to the ovaries.   COMPARISON:  04/13/2022   FINDINGS: Uterus   Measurements: 7.6 x 3.7 x 5.9 cm = volume: 87 mL. Uterus is anteverted. No myometrial mass lesions.   Endometrium   Thickness: 9 mm.  No focal abnormality visualized.   Right ovary   Measurements: 2.6 x 1.4 x 1.9 cm = volume: 4 mL. Normal appearance/no adnexal mass.   Left ovary   Measurements: 6.7 x 4.7 x 5.2 cm = volume: 87 mL. Left ovarian cyst is again demonstrated, measuring 4.9 x 6 x 4.6 cm. Asymmetric nodular area along the cyst wall. No flow is demonstrated  suggesting probably layering debris or internal hemorrhage. Appearance is unchanged since prior study. Size measurements are similar.   Pulsed Doppler evaluation of both ovaries demonstrates normal low-resistance arterial and venous waveforms.   Other findings   Minimal free fluid seen in the left adnexal region.   IMPRESSION: Again demonstrated is a 6 cm max diameter left ovarian cyst with internal debris. This is unchanged in appearance since prior study, probably representing a hemorrhagic cyst. As previously recommended, ultrasound in 6 weeks is suggested to ensure resolution. No evidence of ovarian torsion.     Electronically Signed   By: Lucienne Capers M.D.   On: 04/16/2022 19:11   Narrative & Impression  CLINICAL DATA:  Left lower quadrant/flank pain for 3 days. Nausea and vomiting.   EXAM: CT ABDOMEN AND PELVIS WITHOUT CONTRAST   TECHNIQUE: Multidetector CT imaging of the abdomen and pelvis was performed following the standard protocol without IV contrast.   RADIATION DOSE REDUCTION: This exam was performed according to the departmental dose-optimization program which includes automated exposure control, adjustment of the mA and/or kV according to patient size and/or use of iterative reconstruction technique.   COMPARISON:  CT scan 03/02/2022   FINDINGS: Lower chest: The lung bases are clear of acute process. No pleural effusion or pulmonary lesions. The heart is normal in size. No pericardial effusion. The distal esophagus and aorta are unremarkable.   Hepatobiliary: No hepatic lesions or intrahepatic biliary dilatation. The gallbladder is surgically absent. No common bile duct dilatation.   Pancreas: No mass, inflammation or ductal dilatation.   Spleen: Very irregular nodular appearing spleen appears stable. Could be related to prior trauma. No lesions.   Adrenals/Urinary Tract: The adrenal glands are normal. No renal, ureteral or bladder calculi.  No worrisome renal or bladder lesions without contrast.   Stomach/Bowel: The stomach, duodenum, small bowel and colon are unremarkable. No acute inflammatory process, mass lesions or obstructive findings. The appendix is surgically absent.   Vascular/Lymphatic: The aorta is normal in caliber. No atheroscerlotic calcifications. No mesenteric of retroperitoneal mass or adenopathy. Small scattered lymph nodes are noted.   Reproductive: The uterus is unremarkable. There is a new 6 x 5.7 x 5.6 cm cyst associated  with the left ovary. It measures 20 Hounsfield units. The fact that it was not present in January of this year would suggest a benign process. Recommend follow-up pelvic ultrasound examination in 3-6 months. Note: This recommendation does not apply to premenarchal patients and to those with increased risk (genetic, family history, elevated tumor markers or other high-risk factors) of ovarian cancer. Reference: JACR 2020 Feb; 17(2):248-254   Other: No pelvic mass or adenopathy. No free pelvic fluid collections. No inguinal mass or adenopathy. No abdominal wall hernia or subcutaneous lesions.   Musculoskeletal: No significant bony findings.   IMPRESSION: 1. No acute abdominal/pelvic findings, mass lesions or adenopathy. 2. No renal, ureteral or bladder calculi or mass. 3. New 6 x 5.7 x 5.6 cm left ovarian cyst. Recommend follow-up pelvic ultrasound examination in 3-6 months. 4. Status post cholecystectomy. No biliary dilatation.     Electronically Signed   By: Marijo Sanes M.D.   On: 04/12/2022 14:55    Assessment: pt stable   Plan:  1. Pelvic pain See below   2. Hemorrhagic cyst of left ovary Long d/w pt and expectant management given as option but pt states she's too uncomfortable. She doesn't feel that it feels like her IC. Given this, laparoscopic surgery d/w her. Risks of this including damage to surrounding structures and need for open procedure. She is wanting a  BSO but I told her that given her age, surgical menopause would increase her risk in many areas, particularly CV. I told her I would recommending trying a cystectomy but potential for oophorectomy.   3. IC Previously seen by [facility] but she states that they didn't have anything to offer her and was going to refer her to some place else but never heard anything back. Can d/w her later re: referral to a tertiary care system      RTC post op   Durene Romans MD Attending Center for [facility])

## 2022-04-21 NOTE — Progress Notes (Signed)
GYN Note I spoke to Dr. Oval Linsey and no need for further cardiac clearance for her upcoming surgery.  Durene Romans MD Attending [facility] (Faculty Practice) 04/18/2022 Time: 1500

## 2022-04-21 NOTE — H&P (View-Only) (Signed)
Obstetrics and Gynecology Surgical H&P   Surgery Date: 04/22/2022   OBGYN Clinic: [facility] for [facility]   Primary Care Provider: Patient, No Pcp Per   Referring Provider: [facility]   Chief Complaint: Abdominal pain. 5-6cm hemorrhagic left ovarian cyst.   History of Present Illness: Krystal [name] is a 32 y.o.  [id number] (Patient's last menstrual period was 04/13/2022 (exact date).), seen for the above chief complaint. Her past medical history is significant for multiple medical co-morbidities including h/o c/s x 2 and BTL, h/o l/s chole, h/o nec fasc, hypertensive emergency, IC, migraines, hypothyroidism.    Patient seen by me on 2/20 for a new patient visit and for ED follow up. She was seen on 2/17 and 2/18 in the [facility] EDs for abdominal pain which she thought was a renal stone. She had a CT renal on 2/17 and it showed a 6cm LO hemorrhagic cyst, no free fluid in the pelvis, which was confirmed on u/s; cbc, cmp, u/a, UPT were all negative. She re-presented to the ED the next day with continued pain and u/s showed stable imaging; s/s are currently stable. They were helped with the oxycodone she received in the ED.    Patient's periods are qmonth, regular, 5 days but she only had spotting in January which she attribues due to inpatient admission from 1/10-22 for HTN control. She had a period this month and has some AUB since.  2/20 vaginal swab negative and options d/w her and she elected for surgery.  Patient seen on 2/21 in the ED for continued s/s and had a stable pelvic u/s, negative labs  I spoke to Dr. Oval Linsey (patient's cardiologist) on 2/23 and she stated the patient was fine for surgery without further workup   Review of Systems: Pertinent items noted in HPI and remainder of comprehensive ROS otherwise negative.        Patient Active Problem List    Diagnosis Date Noted   Hemorrhagic cyst of left ovary 04/15/2022   AKI (acute kidney  injury) ([place]) 03/16/2022   Hypothyroidism 03/11/2022   GERD with esophagitis 03/11/2022   Retinal vein occlusion of right eye 03/11/2022   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 03/05/2022   Moderate persistent asthma with acute exacerbation 12/04/2021   Cervicalgia 10/31/2021   Borderline personality disorder in adult [facility]) 10/21/2021   Intractable nausea and vomiting 07/05/2021   Anisocoria 07/02/2021   History of excision of intestinal structure 05/23/2021   Attention deficit disorder 04/17/2021   Chronic fatigue syndrome 04/17/2021   Anosmia 04/17/2021   Chronic urticaria 01/15/2021   Local reaction to hymenoptera sting 01/15/2021   Perennial allergic rhinitis 01/15/2021   MDD (major depressive disorder), recurrent episode, severe ([place]) 11/19/2020   Fibromyalgia 05/11/2020   Gastroesophageal reflux disease 05/11/2020   Interstitial cystitis 05/11/2020   PTSD (post-traumatic stress disorder) 05/11/2020   Primary insomnia 05/11/2020   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   Hypertensive urgency 05/08/2020   Hypothyroidism due to Hashimoto's thyroiditis 10/03/2019   Anxiety 06/16/2019   Obesity 06/07/2019   Lumbar spondylosis 06/23/2018   Sacroiliac inflammation ([place]) 06/23/2018   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   History of upper limb amputation, wrist, 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      Past Medical History:      Past Medical History:  Diagnosis Date   Acute acalculous cholecystitis s/p lap cholecystectomy 05/11/2020 05/11/2020   Angio-edema 01/15/2021   Anxiety     Asthma     Depression  HELLP (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  History of HELLP syndrome at 26 weeks 5 days (12/07/2017); admitted to [facility] with BP 200/120s, Plt 52, AST/ALT 471/375  APS  evaluation negative (04/2019)   Daily ASA 81 mg   Challenging blood pressure control in current pregnancy      Plan:  - Baseline pre-eclampsia labs this pregnancy within normal limits (Cr: 0.71; urine p:c 0.106)  -    IC (interstitial cystitis)     Kidney stones     Migraines     Necrotizing fasciitis ([place])      left hand amputation   PTSD (post-traumatic stress disorder)     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   Severe uncontrolled hypertension 10/08/2017   UTI (urinary tract infection)        Past Surgical History:       Past Surgical History:  Procedure Laterality Date   APPENDECTOMY       BIOPSY   05/10/2020    Procedure: BIOPSY;  Surgeon: Arta Silence, MD;  Location: [facility];  Service: Endoscopy;;   CESAREAN SECTION N/A 12/06/2017    Procedure: CESAREAN SECTION;  Surgeon: Sloan Leiter, MD;  Location: [facility];  Service: Obstetrics;  Laterality: N/A;   CESAREAN SECTION   07/21/2021    Procedure: CESAREAN SECTION;  Surgeon: Donnamae Jude, MD;  Location: [place] [facility];  Service: Obstetrics;;   CHOLECYSTECTOMY N/A 05/11/2020    Procedure: SINGLE SITE LAPAROSCOPIC CHOLECYSTECTOMY AND LIVER BIOSY;  Surgeon: Michael Boston, MD;  Location: [facility];  Service: General;  Laterality: N/A;   ESOPHAGOGASTRODUODENOSCOPY (EGD) WITH PROPOFOL N/A 05/10/2020    Procedure: ESOPHAGOGASTRODUODENOSCOPY (EGD) WITH PROPOFOL;  Surgeon: Arta Silence, MD;  Location: [facility];  Service: Endoscopy;  Laterality: N/A;   hand amputation        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   HAND RECONSTRUCTION Right     INCISION AND DRAINAGE       NASAL SEPTUM SURGERY          Past Obstetrical History:                  OB History  Gravida Para Term Preterm AB Living  5 3 0 '3 2 2  '$ SAB IAB Ectopic Multiple Live Births     2 0 0 0 2        # Outcome Date GA Lbr Len/2nd Weight Sex Delivery Anes PTL Lv  5 Preterm 07/21/21 [redacted]w[redacted]d      CS-LTranv        4 SAB 08/2020                  3 Preterm 09/14/19  317w3d 4 lb 7 oz (2.013 kg) F CS-LTranv     LIV  2 Preterm 12/07/17 2665w5d1 lb 10.8 oz (0.76 kg) F CS-LTranv Gen   LIV     Birth Comments: ELBW  1 SAB                        Past Gynecological History: As per HPI. History of Pap Smear(s): Yes.   Last pap 07/2021, which was wnl and hpv negative   Social History:  Social History         Socioeconomic History  Marital status: Married      Spouse name: Not on file   Number of children: 3   Years of education: Not on file   Highest education level: 8th grade  Occupational History   Not on file  Tobacco Use   Smoking status: Never   Smokeless tobacco: Never   Tobacco comments:      She tried a cigarette and never smoked again  Vaping Use   Vaping Use: Never used  Substance and Sexual Activity   Alcohol use: Yes      Comment: bottle of wine a week-occ   Drug use: No   Sexual activity: Not Currently      Birth control/protection: Surgical  Other Topics Concern   Not on file  Social History Narrative   Not on file    Social Determinants of Health        Financial Resource Strain: Not on file  Food Insecurity: Food Insecurity Present (03/12/2022)    Hunger Vital Sign     Worried About Running Out of Food in the Last Year: Often true     Ran Out of Food in the Last Year: Often true  Transportation Needs: No Transportation Needs (03/12/2022)    PRAPARE - Armed forces logistics/support/administrative officer (Medical): No     Lack of Transportation (Non-Medical): No  Recent Concern: Transportation Needs - Unmet Transportation Needs (03/12/2022)    PRAPARE - Transportation     Lack of Transportation (Medical): Yes     Lack of Transportation (Non-Medical): Yes  Physical Activity: Inactive (04/09/2022)    Exercise Vital Sign     Days of Exercise per Week: 0 days     Minutes of Exercise per Session: 0 min  Stress: Not on file  Social Connections: Not on file  Intimate Partner Violence: Not At Risk (03/12/2022)    Humiliation, Afraid,  Rape, and Kick questionnaire     Fear of Current or Ex-Partner: No     Emotionally Abused: No     Physically Abused: No     Sexually Abused: No      Family History:       Family History  Problem Relation Age of Onset   Hypertension Mother     Coronary artery disease Mother     Hypertension Father     Stroke Father     Atrial fibrillation Father     Heart failure Maternal Grandfather     Heart attack Maternal Grandfather        Medications Clorine C. Scotto had no medications administered during this visit.       Current Outpatient Medications  Medication Sig Dispense Refill   albuterol (VENTOLIN HFA) 108 (90 Base) MCG/ACT inhaler INHALE 2 PUFFS INTO THE LUNGS EVERY 4 HOURS AS NEEDED FOR WHEEZING OR SHORTNESS OR BREATH 18 g 0   amLODipine (NORVASC) 10 MG tablet Take 1 tablet (10 mg total) by mouth daily. 90 tablet 2   cetirizine (ZYRTEC ALLERGY) 10 MG tablet Take 1 tablet (10 mg total) by mouth 2 (two) times daily. 60 tablet 5   chlorthalidone (HYGROTON) 25 MG tablet Take 1 tablet (25 mg total) by mouth daily. 90 tablet 3   clonazePAM (KLONOPIN) 1 MG tablet Take 1 tablet (1 mg total) by mouth 3 (three) times daily as needed for anxiety (anxiety). 90 tablet 0   cloNIDine (CATAPRES - DOSED IN MG/24 HR) 0.3 mg/24hr patch Place 1 patch (0.3 mg total) onto the skin once  a week. 4 patch 2   famotidine (PEPCID) 20 MG tablet Take 1 tablet (20 mg total) by mouth 2 (two) times daily. 60 tablet 5   FLUoxetine (PROZAC) 10 MG capsule Take 1 capsule (10 mg total) by mouth daily for 7 days, THEN 2 capsules (20 mg total) daily for 7 days, THEN 3 capsules (30 mg total) daily for 7 days, THEN 4 capsules (40 mg total) daily for 20 days. 122 capsule 0   gabapentin (NEURONTIN) 300 MG capsule Take 1 capsule (300 mg total) by mouth 2 (two) times daily. 60 capsule 0   levothyroxine (SYNTHROID) 125 MCG tablet Take 1 tablet (125 mcg total) by mouth daily before breakfast. 45 tablet 3   lurasidone (LATUDA)  20 MG TABS tablet Take 1 tablet (20 mg total) by mouth daily. Take with food (dinner) 30 tablet 1   Prenatal Multivit-Min-Fe-FA (PRE-NATAL PO) Take 1 tablet by mouth daily.       propranolol (INDERAL) 20 MG tablet Take 1 tablet (20 mg total) by mouth 2 (two) times daily as needed (for anxiety). 60 tablet 1   SYMBICORT 80-4.5 MCG/ACT inhaler Inhale 2 puffs into the lungs 2 (two) times daily.       valsartan (DIOVAN) 320 MG tablet Take 1 tablet (320 mg total) by mouth daily. 90 tablet 3   venlafaxine XR (EFFEXOR XR) 37.5 MG 24 hr capsule Take 1 capsule (37.5 mg total) by mouth daily. Take with 75 mg for a total of 112.5 mg for 7 days before decreasing to 75 mg daily for 7 days and then decrease to 37.5 mg for 7 days before discontinuing 14 capsule 0   EPINEPHrine 0.3 mg/0.3 mL IJ SOAJ injection Inject 0.3 mg into the muscle as needed for anaphylaxis. (Patient not taking: Reported on 04/15/2022) 1 each 2   hydrOXYzine (ATARAX) 25 MG tablet Take 1 tablet (25 mg total) by mouth every 6 (six) hours. (Patient not taking: Reported on 04/15/2022) 12 tablet 0   ondansetron (ZOFRAN-ODT) 4 MG disintegrating tablet Take 1 tablet (4 mg total) by mouth every 8 (eight) hours as needed for nausea or vomiting. (Patient not taking: Reported on 04/15/2022) 20 tablet 0   opium-belladonna (B&O SUPPRETTES) 16.2-60 MG suppository Place 1 suppository rectally as needed for bladder spasms. (Patient not taking: Reported on 04/15/2022) 12 suppository 0   oxyCODONE (ROXICODONE) 5 MG immediate release tablet Take 1 tablet (5 mg total) by mouth every 4 (four) hours as needed for severe pain. 30 tablet 0   spironolactone (ALDACTONE) 25 MG tablet Take 1 tablet (25 mg total) by mouth daily. (Patient not taking: Reported on 04/15/2022) 90 tablet 3    No current facility-administered medications for this visit.      Allergies Coreg [carvedilol], Other, Red dye, Compazine [prochlorperazine maleate], Vicodin [hydrocodone-acetaminophen],  Amitriptyline hcl, Duloxetine hcl, Lisinopril, Naproxen, Reglan [metoclopramide], and Nifedipine     Physical Exam:  BP (!) 139/99   Pulse 80   Ht '5\' 11"'$  (1.803 m)   Wt 238 lb 8 oz (108.2 kg)   LMP 04/13/2022 (Exact Date)   Breastfeeding No   BMI 33.26 kg/m  Body mass index is 33.26 kg/m. General appearance: Well nourished, well developed female in no acute distress. Patient tearful at times Cardiovascular: normal s1 and s2.  No murmurs, rubs or gallops. Respiratory:  Clear to auscultation bilateral. Normal respiratory effort Abdomen: soft, nd, minimally ttp in the llq, no peritoneal s/s.  Neuro/Psych:  Normal mood and affect.  Skin:  Warm and dry.    Laboratory: as per HPI.    Radiology:  Narrative & Impression  CLINICAL DATA:  Pelvic pain for 2 weeks. History of left ovarian cyst rule out torsion.   EXAM: TRANSABDOMINAL AND TRANSVAGINAL ULTRASOUND OF PELVIS   DOPPLER ULTRASOUND OF OVARIES   TECHNIQUE: Both transabdominal and transvaginal ultrasound examinations of the pelvis were performed. Transabdominal technique was performed for global imaging of the pelvis including uterus, ovaries, adnexal regions, and pelvic cul-de-sac.   It was necessary to proceed with endovaginal exam following the transabdominal exam to visualize the ovaries and endometrium. Color and duplex Doppler ultrasound was utilized to evaluate blood flow to the ovaries.   COMPARISON:  04/13/2022   FINDINGS: Uterus   Measurements: 7.6 x 3.7 x 5.9 cm = volume: 87 mL. Uterus is anteverted. No myometrial mass lesions.   Endometrium   Thickness: 9 mm.  No focal abnormality visualized.   Right ovary   Measurements: 2.6 x 1.4 x 1.9 cm = volume: 4 mL. Normal appearance/no adnexal mass.   Left ovary   Measurements: 6.7 x 4.7 x 5.2 cm = volume: 87 mL. Left ovarian cyst is again demonstrated, measuring 4.9 x 6 x 4.6 cm. Asymmetric nodular area along the cyst wall. No flow is demonstrated  suggesting probably layering debris or internal hemorrhage. Appearance is unchanged since prior study. Size measurements are similar.   Pulsed Doppler evaluation of both ovaries demonstrates normal low-resistance arterial and venous waveforms.   Other findings   Minimal free fluid seen in the left adnexal region.   IMPRESSION: Again demonstrated is a 6 cm max diameter left ovarian cyst with internal debris. This is unchanged in appearance since prior study, probably representing a hemorrhagic cyst. As previously recommended, ultrasound in 6 weeks is suggested to ensure resolution. No evidence of ovarian torsion.     Electronically Signed   By: Lucienne Capers M.D.   On: 04/16/2022 19:11   Narrative & Impression  CLINICAL DATA:  Left lower quadrant/flank pain for 3 days. Nausea and vomiting.   EXAM: CT ABDOMEN AND PELVIS WITHOUT CONTRAST   TECHNIQUE: Multidetector CT imaging of the abdomen and pelvis was performed following the standard protocol without IV contrast.   RADIATION DOSE REDUCTION: This exam was performed according to the departmental dose-optimization program which includes automated exposure control, adjustment of the mA and/or kV according to patient size and/or use of iterative reconstruction technique.   COMPARISON:  CT scan 03/02/2022   FINDINGS: Lower chest: The lung bases are clear of acute process. No pleural effusion or pulmonary lesions. The heart is normal in size. No pericardial effusion. The distal esophagus and aorta are unremarkable.   Hepatobiliary: No hepatic lesions or intrahepatic biliary dilatation. The gallbladder is surgically absent. No common bile duct dilatation.   Pancreas: No mass, inflammation or ductal dilatation.   Spleen: Very irregular nodular appearing spleen appears stable. Could be related to prior trauma. No lesions.   Adrenals/Urinary Tract: The adrenal glands are normal. No renal, ureteral or bladder calculi.  No worrisome renal or bladder lesions without contrast.   Stomach/Bowel: The stomach, duodenum, small bowel and colon are unremarkable. No acute inflammatory process, mass lesions or obstructive findings. The appendix is surgically absent.   Vascular/Lymphatic: The aorta is normal in caliber. No atheroscerlotic calcifications. No mesenteric of retroperitoneal mass or adenopathy. Small scattered lymph nodes are noted.   Reproductive: The uterus is unremarkable. There is a new 6 x 5.7 x 5.6 cm cyst associated  with the left ovary. It measures 20 Hounsfield units. The fact that it was not present in January of this year would suggest a benign process. Recommend follow-up pelvic ultrasound examination in 3-6 months. Note: This recommendation does not apply to premenarchal patients and to those with increased risk (genetic, family history, elevated tumor markers or other high-risk factors) of ovarian cancer. Reference: JACR 2020 Feb; 17(2):248-254   Other: No pelvic mass or adenopathy. No free pelvic fluid collections. No inguinal mass or adenopathy. No abdominal wall hernia or subcutaneous lesions.   Musculoskeletal: No significant bony findings.   IMPRESSION: 1. No acute abdominal/pelvic findings, mass lesions or adenopathy. 2. No renal, ureteral or bladder calculi or mass. 3. New 6 x 5.7 x 5.6 cm left ovarian cyst. Recommend follow-up pelvic ultrasound examination in 3-6 months. 4. Status post cholecystectomy. No biliary dilatation.     Electronically Signed   By: Marijo Sanes M.D.   On: 04/12/2022 14:55    Assessment: pt stable   Plan:  1. Pelvic pain See below   2. Hemorrhagic cyst of left ovary Long d/w pt and expectant management given as option but pt states she's too uncomfortable. She doesn't feel that it feels like her IC. Given this, laparoscopic surgery d/w her. Risks of this including damage to surrounding structures and need for open procedure. She is wanting a  BSO but I told her that given her age, surgical menopause would increase her risk in many areas, particularly CV. I told her I would recommending trying a cystectomy but potential for oophorectomy.   3. IC Previously seen by [facility] but she states that they didn't have anything to offer her and was going to refer her to some place else but never heard anything back. Can d/w her later re: referral to a tertiary care system      RTC post op   Durene Romans MD Attending Center for [employer] [facility])

## 2022-04-22 ENCOUNTER — Other Ambulatory Visit: Payer: Self-pay

## 2022-04-22 ENCOUNTER — Ambulatory Visit (HOSPITAL_BASED_OUTPATIENT_CLINIC_OR_DEPARTMENT_OTHER): Payer: Self-pay | Admitting: Physician Assistant

## 2022-04-22 ENCOUNTER — Encounter (HOSPITAL_COMMUNITY)
Admission: RE | Disposition: A | Discharge status: Home / Self Care | Payer: Self-pay | Source: Home / Self Care | Attending: Obstetrics and Gynecology

## 2022-04-22 ENCOUNTER — Telehealth: Payer: Self-pay

## 2022-04-22 ENCOUNTER — Ambulatory Visit (HOSPITAL_COMMUNITY): Payer: Self-pay | Admitting: Physician Assistant

## 2022-04-22 ENCOUNTER — Ambulatory Visit: Admit: 2022-04-22 | Payer: Self-pay | Admitting: Obstetrics and Gynecology

## 2022-04-22 ENCOUNTER — Ambulatory Visit (HOSPITAL_COMMUNITY)
Admission: RE | Admit: 2022-04-22 | Discharge: 2022-04-22 | Disposition: A | Discharge status: Home / Self Care | Payer: Self-pay | Attending: Obstetrics and Gynecology | Admitting: Obstetrics and Gynecology

## 2022-04-22 DIAGNOSIS — Z9889 Other specified postprocedural states: Secondary | ICD-10-CM

## 2022-04-22 DIAGNOSIS — R102 Pelvic and perineal pain: Secondary | ICD-10-CM

## 2022-04-22 DIAGNOSIS — E669 Obesity, unspecified: Secondary | ICD-10-CM | POA: Insufficient documentation

## 2022-04-22 DIAGNOSIS — Z9851 Tubal ligation status: Secondary | ICD-10-CM | POA: Insufficient documentation

## 2022-04-22 DIAGNOSIS — Z9049 Acquired absence of other specified parts of digestive tract: Secondary | ICD-10-CM | POA: Insufficient documentation

## 2022-04-22 DIAGNOSIS — F418 Other specified anxiety disorders: Secondary | ICD-10-CM | POA: Insufficient documentation

## 2022-04-22 DIAGNOSIS — I10 Essential (primary) hypertension: Secondary | ICD-10-CM

## 2022-04-22 DIAGNOSIS — N83202 Unspecified ovarian cyst, left side: Secondary | ICD-10-CM

## 2022-04-22 DIAGNOSIS — N736 Female pelvic peritoneal adhesions (postinfective): Secondary | ICD-10-CM

## 2022-04-22 DIAGNOSIS — D271 Benign neoplasm of left ovary: Secondary | ICD-10-CM

## 2022-04-22 DIAGNOSIS — Z01818 Encounter for other preprocedural examination: Secondary | ICD-10-CM

## 2022-04-22 DIAGNOSIS — Z6833 Body mass index (BMI) 33.0-33.9, adult: Secondary | ICD-10-CM | POA: Insufficient documentation

## 2022-04-22 DIAGNOSIS — K219 Gastro-esophageal reflux disease without esophagitis: Secondary | ICD-10-CM | POA: Insufficient documentation

## 2022-04-22 DIAGNOSIS — E063 Autoimmune thyroiditis: Secondary | ICD-10-CM | POA: Insufficient documentation

## 2022-04-22 DIAGNOSIS — J45909 Unspecified asthma, uncomplicated: Secondary | ICD-10-CM | POA: Insufficient documentation

## 2022-04-22 DIAGNOSIS — M797 Fibromyalgia: Secondary | ICD-10-CM | POA: Insufficient documentation

## 2022-04-22 HISTORY — PX: LAPAROSCOPIC LYSIS OF ADHESIONS: SHX5905

## 2022-04-22 HISTORY — PX: LAPAROSCOPIC OVARIAN CYSTECTOMY: SHX6248

## 2022-04-22 LAB — TYPE AND SCREEN
ABO/RH(D): O POS
Antibody Screen: NEGATIVE

## 2022-04-22 LAB — POCT PREGNANCY, URINE: Preg Test, Ur: NEGATIVE

## 2022-04-22 SURGERY — EXCISION, CYST, OVARY, LAPAROSCOPIC
Anesthesia: General | Site: Abdomen | Laterality: Left

## 2022-04-22 SURGERY — EXCISION, CYST, OVARY, LAPAROSCOPIC
Anesthesia: Choice

## 2022-04-22 MED ORDER — GABAPENTIN 300 MG PO CAPS
300.0000 mg | ORAL_CAPSULE | Freq: Once | ORAL | Status: AC
  Administered 2022-04-22: 300 mg via ORAL
  Filled 2022-04-22: qty 1

## 2022-04-22 MED ORDER — OXYCODONE HCL 5 MG PO TABS
ORAL_TABLET | ORAL | Status: AC
  Filled 2022-04-22: qty 1

## 2022-04-22 MED ORDER — HYDROMORPHONE HCL 1 MG/ML IJ SOLN
INTRAMUSCULAR | Status: DC | PRN
  Administered 2022-04-22: .5 mg via INTRAVENOUS

## 2022-04-22 MED ORDER — PROPOFOL 10 MG/ML IV BOLUS
INTRAVENOUS | Status: DC | PRN
  Administered 2022-04-22: 170 mg via INTRAVENOUS
  Administered 2022-04-22: 25 ug/kg/min via INTRAVENOUS

## 2022-04-22 MED ORDER — SUCCINYLCHOLINE CHLORIDE 200 MG/10ML IV SOSY: PREFILLED_SYRINGE | INTRAVENOUS | Status: DC | PRN

## 2022-04-22 MED ORDER — SODIUM CHLORIDE 0.9 % IV SOLN: INTRAVENOUS | Status: DC

## 2022-04-22 MED ORDER — LIDOCAINE 2% (20 MG/ML) 5 ML SYRINGE
INTRAMUSCULAR | Status: DC | PRN
  Administered 2022-04-22: 80 mg via INTRAVENOUS

## 2022-04-22 MED ORDER — LACTATED RINGERS IV SOLN: INTRAVENOUS | Status: DC

## 2022-04-22 MED ORDER — ROCURONIUM BROMIDE 10 MG/ML (PF) SYRINGE
PREFILLED_SYRINGE | INTRAVENOUS | Status: AC
  Filled 2022-04-22: qty 10

## 2022-04-22 MED ORDER — HYDROMORPHONE HCL 1 MG/ML IJ SOLN
INTRAMUSCULAR | Status: AC
  Filled 2022-04-22: qty 0.5

## 2022-04-22 MED ORDER — KETAMINE HCL 50 MG/5ML IJ SOSY
PREFILLED_SYRINGE | INTRAMUSCULAR | Status: AC
  Filled 2022-04-22: qty 5

## 2022-04-22 MED ORDER — ACETAMINOPHEN 500 MG PO TABS
1000.0000 mg | ORAL_TABLET | Freq: Once | ORAL | Status: AC
  Administered 2022-04-22: 1000 mg via ORAL
  Filled 2022-04-22: qty 2

## 2022-04-22 MED ORDER — FENTANYL CITRATE (PF) 100 MCG/2ML IJ SOLN
INTRAMUSCULAR | Status: AC
  Administered 2022-04-22: 50 ug via INTRAVENOUS
  Filled 2022-04-22: qty 2

## 2022-04-22 MED ORDER — MIDAZOLAM HCL 2 MG/2ML IJ SOLN
INTRAMUSCULAR | Status: AC
  Filled 2022-04-22: qty 2

## 2022-04-22 MED ORDER — KETAMINE HCL 10 MG/ML IJ SOLN
INTRAMUSCULAR | Status: DC | PRN
  Administered 2022-04-22: 20 mg via INTRAVENOUS

## 2022-04-22 MED ORDER — FENTANYL CITRATE (PF) 100 MCG/2ML IJ SOLN
25.0000 ug | INTRAMUSCULAR | Status: DC | PRN
  Administered 2022-04-22: 50 ug via INTRAVENOUS

## 2022-04-22 MED ORDER — HYDROMORPHONE HCL 1 MG/ML IJ SOLN
INTRAMUSCULAR | Status: AC
  Filled 2022-04-22: qty 1

## 2022-04-22 MED ORDER — OXYCODONE HCL 10 MG PO TABS: 10.0000 mg | ORAL_TABLET | Freq: Four times a day (QID) | ORAL | 0 refills | Status: DC | PRN

## 2022-04-22 MED ORDER — HYDROMORPHONE HCL 1 MG/ML IJ SOLN
0.5000 mg | INTRAMUSCULAR | Status: AC | PRN
  Administered 2022-04-22 (×4): 0.5 mg via INTRAVENOUS

## 2022-04-22 MED ORDER — ROCURONIUM BROMIDE 10 MG/ML (PF) SYRINGE
PREFILLED_SYRINGE | INTRAVENOUS | Status: DC | PRN
  Administered 2022-04-22: 60 mg via INTRAVENOUS
  Administered 2022-04-22: 20 mg via INTRAVENOUS

## 2022-04-22 MED ORDER — LIDOCAINE 2% (20 MG/ML) 5 ML SYRINGE
INTRAMUSCULAR | Status: AC
  Filled 2022-04-22: qty 5

## 2022-04-22 MED ORDER — CHLORHEXIDINE GLUCONATE 0.12 % MT SOLN
15.0000 mL | Freq: Once | OROMUCOSAL | Status: AC
  Administered 2022-04-22: 15 mL via OROMUCOSAL
  Filled 2022-04-22: qty 15

## 2022-04-22 MED ORDER — ALBUTEROL SULFATE HFA 108 (90 BASE) MCG/ACT IN AERS
INHALATION_SPRAY | RESPIRATORY_TRACT | Status: AC
  Filled 2022-04-22: qty 6.7

## 2022-04-22 MED ORDER — SUGAMMADEX SODIUM 200 MG/2ML IV SOLN
INTRAVENOUS | Status: DC | PRN
  Administered 2022-04-22: 200 mg via INTRAVENOUS

## 2022-04-22 MED ORDER — ALBUTEROL SULFATE HFA 108 (90 BASE) MCG/ACT IN AERS
INHALATION_SPRAY | RESPIRATORY_TRACT | Status: DC | PRN
  Administered 2022-04-22 (×2): 6 via RESPIRATORY_TRACT

## 2022-04-22 MED ORDER — KETOROLAC TROMETHAMINE 30 MG/ML IJ SOLN
INTRAMUSCULAR | Status: AC
  Filled 2022-04-22: qty 1

## 2022-04-22 MED ORDER — 0.9 % SODIUM CHLORIDE (POUR BTL) OPTIME
TOPICAL | Status: DC | PRN
  Administered 2022-04-22: 1000 mL

## 2022-04-22 MED ORDER — KETOROLAC TROMETHAMINE 30 MG/ML IJ SOLN
INTRAMUSCULAR | Status: DC | PRN
  Administered 2022-04-22: 30 mg via INTRAVENOUS

## 2022-04-22 MED ORDER — ONDANSETRON HCL 4 MG/2ML IJ SOLN
INTRAMUSCULAR | Status: AC
  Filled 2022-04-22: qty 2

## 2022-04-22 MED ORDER — MIDAZOLAM HCL 2 MG/2ML IJ SOLN
INTRAMUSCULAR | Status: DC | PRN
  Administered 2022-04-22: 2 mg via INTRAVENOUS

## 2022-04-22 MED ORDER — HEMOSTATIC AGENTS (NO CHARGE) OPTIME
TOPICAL | Status: DC | PRN
  Administered 2022-04-22: 1 via TOPICAL

## 2022-04-22 MED ORDER — PHENYLEPHRINE 80 MCG/ML (10ML) SYRINGE FOR IV PUSH (FOR BLOOD PRESSURE SUPPORT)
PREFILLED_SYRINGE | INTRAVENOUS | Status: AC
  Filled 2022-04-22: qty 10

## 2022-04-22 MED ORDER — PROPOFOL 1000 MG/100ML IV EMUL
INTRAVENOUS | Status: AC
  Filled 2022-04-22: qty 200

## 2022-04-22 MED ORDER — DEXAMETHASONE SODIUM PHOSPHATE 10 MG/ML IJ SOLN
INTRAMUSCULAR | Status: DC | PRN
  Administered 2022-04-22: 10 mg via INTRAVENOUS

## 2022-04-22 MED ORDER — PHENYLEPHRINE 80 MCG/ML (10ML) SYRINGE FOR IV PUSH (FOR BLOOD PRESSURE SUPPORT)
PREFILLED_SYRINGE | INTRAVENOUS | Status: DC | PRN
  Administered 2022-04-22: 80 ug via INTRAVENOUS

## 2022-04-22 MED ORDER — BUPIVACAINE HCL 0.5 % IJ SOLN
INTRAMUSCULAR | Status: DC | PRN
  Administered 2022-04-22: 22 mL

## 2022-04-22 MED ORDER — PROMETHAZINE HCL 25 MG/ML IJ SOLN: 6.2500 mg | INTRAMUSCULAR | Status: DC | PRN

## 2022-04-22 MED ORDER — SCOPOLAMINE 1 MG/3DAYS TD PT72
1.0000 | MEDICATED_PATCH | Freq: Once | TRANSDERMAL | Status: DC
  Administered 2022-04-22: 1.5 mg via TRANSDERMAL
  Filled 2022-04-22: qty 1

## 2022-04-22 MED ORDER — OXYCODONE HCL 5 MG PO TABS
5.0000 mg | ORAL_TABLET | Freq: Once | ORAL | Status: AC
  Administered 2022-04-22: 5 mg via ORAL

## 2022-04-22 MED ORDER — FENTANYL CITRATE (PF) 100 MCG/2ML IJ SOLN: 50.0000 ug | Freq: Once | INTRAMUSCULAR | Status: AC

## 2022-04-22 MED ORDER — FENTANYL CITRATE (PF) 250 MCG/5ML IJ SOLN
INTRAMUSCULAR | Status: DC | PRN
  Administered 2022-04-22: 50 ug via INTRAVENOUS
  Administered 2022-04-22: 100 ug via INTRAVENOUS
  Administered 2022-04-22: 50 ug via INTRAVENOUS

## 2022-04-22 MED ORDER — FENTANYL CITRATE (PF) 100 MCG/2ML IJ SOLN
INTRAMUSCULAR | Status: AC
  Filled 2022-04-22: qty 2

## 2022-04-22 MED ORDER — ONDANSETRON HCL 4 MG/2ML IJ SOLN
INTRAMUSCULAR | Status: DC | PRN
  Administered 2022-04-22: 4 mg via INTRAVENOUS

## 2022-04-22 MED ORDER — DEXAMETHASONE SODIUM PHOSPHATE 10 MG/ML IJ SOLN
INTRAMUSCULAR | Status: AC
  Filled 2022-04-22: qty 1

## 2022-04-22 MED ORDER — ORAL CARE MOUTH RINSE: 15.0000 mL | Freq: Once | OROMUCOSAL | Status: AC

## 2022-04-22 MED ORDER — PROPOFOL 10 MG/ML IV BOLUS
INTRAVENOUS | Status: AC
  Filled 2022-04-22: qty 20

## 2022-04-22 MED ORDER — FENTANYL CITRATE (PF) 250 MCG/5ML IJ SOLN
INTRAMUSCULAR | Status: AC
  Filled 2022-04-22: qty 5

## 2022-04-22 MED ORDER — BUPIVACAINE HCL (PF) 0.5 % IJ SOLN
INTRAMUSCULAR | Status: AC
  Filled 2022-04-22: qty 30

## 2022-04-22 SURGICAL SUPPLY — 40 items
ADH SKN CLS APL DERMABOND .7 (GAUZE/BANDAGES/DRESSINGS) ×2
APL SRG 38 LTWT LNG FL B (MISCELLANEOUS) ×2
APL SWBSTK 6 STRL LF DISP (MISCELLANEOUS)
APPLICATOR ARISTA FLEXITIP XL (MISCELLANEOUS) IMPLANT
APPLICATOR COTTON TIP 6 STRL (MISCELLANEOUS) ×2 IMPLANT
APPLICATOR COTTON TIP 6IN STRL (MISCELLANEOUS)
BLADE SURG 15 STRL LF DISP TIS (BLADE) ×2 IMPLANT
BLADE SURG 15 STRL SS (BLADE)
CABLE HIGH FREQUENCY MONO STRZ (ELECTRODE) IMPLANT
DEFOGGER SCOPE WARMER CLEARIFY (MISCELLANEOUS) ×2 IMPLANT
DERMABOND ADVANCED .7 DNX12 (GAUZE/BANDAGES/DRESSINGS) ×2 IMPLANT
DRSG OPSITE POSTOP 3X4 (GAUZE/BANDAGES/DRESSINGS) IMPLANT
DURAPREP 26ML APPLICATOR (WOUND CARE) ×2 IMPLANT
ELECT REM PT RETURN 9FT ADLT (ELECTROSURGICAL) ×2
ELECTRODE REM PT RTRN 9FT ADLT (ELECTROSURGICAL) ×2 IMPLANT
GLOVE BIOGEL PI IND STRL 7.0 (GLOVE) ×4 IMPLANT
GLOVE BIOGEL PI IND STRL 7.5 (GLOVE) ×4 IMPLANT
GLOVE SURG SS PI 7.0 STRL IVOR (GLOVE) ×2 IMPLANT
GOWN STRL REUS W/ TWL LRG LVL3 (GOWN DISPOSABLE) ×6 IMPLANT
GOWN STRL REUS W/TWL LRG LVL3 (GOWN DISPOSABLE) ×4
HEMOSTAT ARISTA ABSORB 3G PWDR (HEMOSTASIS) IMPLANT
KIT TURNOVER KIT B (KITS) ×2 IMPLANT
LIGASURE VESSEL 5MM BLUNT TIP (ELECTROSURGICAL) IMPLANT
NS IRRIG 1000ML POUR BTL (IV SOLUTION) ×2 IMPLANT
PACK LAPAROSCOPY BASIN (CUSTOM PROCEDURE TRAY) ×2 IMPLANT
PAD OB MATERNITY 4.3X12.25 (PERSONAL CARE ITEMS) ×2 IMPLANT
POUCH LAPAROSCOPIC INSTRUMENT (MISCELLANEOUS) ×2 IMPLANT
PROTECTOR NERVE ULNAR (MISCELLANEOUS) ×4 IMPLANT
SCISSORS LAP 5X35 DISP (ENDOMECHANICALS) IMPLANT
SET TUBE SMOKE EVAC HIGH FLOW (TUBING) ×2 IMPLANT
SLEEVE ADV FIXATION 5X100MM (TROCAR) IMPLANT
SUT MON AB 4-0 PS1 27 (SUTURE) ×2 IMPLANT
SUT VICRYL 0 UR6 27IN ABS (SUTURE) ×2 IMPLANT
SYR 10ML LL (SYRINGE) ×2 IMPLANT
SYS BAG RETRIEVAL 10MM (BASKET)
SYSTEM BAG RETRIEVAL 10MM (BASKET) IMPLANT
TOWEL GREEN STERILE FF (TOWEL DISPOSABLE) ×4 IMPLANT
TRAY FOLEY W/BAG SLVR 14FR (SET/KITS/TRAYS/PACK) ×2 IMPLANT
TROCAR ADV FIXATION 5X100MM (TROCAR) IMPLANT
TROCAR BALLN 12MMX100 BLUNT (TROCAR) IMPLANT

## 2022-04-22 NOTE — Anesthesia Preprocedure Evaluation (Addendum)
Anesthesia Evaluation  Patient identified by MRN, date of birth, ID band Patient awake    Reviewed: Allergy & Precautions, NPO status , Patient's Chart, lab work & pertinent test results, reviewed documented beta blocker date and time   Airway Mallampati: II  TM Distance: >3 FB Neck ROM: Full    Dental  (+) Teeth Intact, Dental Advisory Given   Pulmonary asthma    Pulmonary exam normal breath sounds clear to auscultation       Cardiovascular hypertension, Pt. on medications and Pt. on home beta blockers Normal cardiovascular exam Rhythm:Regular Rate:Normal     Neuro/Psych  Headaches PSYCHIATRIC DISORDERS Anxiety Depression       GI/Hepatic Neg liver ROS,GERD  Medicated,,  Endo/Other  Hypothyroidism  Obesity   Renal/GU negative Renal ROS Bladder dysfunction (IC)      Musculoskeletal  (+) Arthritis ,  Fibromyalgia -  Abdominal   Peds  Hematology negative hematology ROS (+)   Anesthesia Other Findings Day of surgery medications reviewed with the patient.  Reproductive/Obstetrics Left Ovarian Cyst                             Anesthesia Physical Anesthesia Plan  ASA: 2  Anesthesia Plan: General   Post-op Pain Management: Tylenol PO (pre-op)*, Ketamine IV* and Toradol IV (intra-op)*   Induction: Intravenous  PONV Risk Score and Plan: 4 or greater and Scopolamine patch - Pre-op, Midazolam, Dexamethasone and Ondansetron  Airway Management Planned: Oral ETT  Additional Equipment:   Intra-op Plan:   Post-operative Plan: Extubation in OR  Informed Consent: I have reviewed the patients History and Physical, chart, labs and discussed the procedure including the risks, benefits and alternatives for the proposed anesthesia with the patient or authorized representative who has indicated his/her understanding and acceptance.     Dental advisory given  Plan Discussed with:  CRNA  Anesthesia Plan Comments:        Anesthesia Quick Evaluation

## 2022-04-22 NOTE — Op Note (Addendum)
Operative Note   04/22/2022  PRE-OP DIAGNOSIS: Left hemorrhagic ovarian cyst. Pelvic pain  POST-OP DIAGNOSIS: Same. Pelvic adhesions  SURGEON: Aletha Halim, MD - Primary  ASSISTANT: None  ANESTHESIA: General and local  PROCEDURE: Laparoscopic lysis of adhesions less than 45 minutes, left ovarian cystectomy.   ESTIMATED BLOOD LOSS: 73m  DRAINS: indwelling foley UOP 1574m  TOTAL IV FLUIDS: per anesthesia  SPECIMENS: left ovarian cyst   VTE PROPHYLAXIS: SCDs to the bilateral lower extremities  ANTIBIOTICS: not indicated  COMPLICATIONS: None  DISPOSITION: PACU - hemodynamically stable.  CONDITION: stable  FINDINGS: Exam under anesthesia revealed normal EGBUS, vagina and cervix.  Laparoscopic survey of the abdomen revealed a hemorrhagic cyst in the left ovary measuring approximately 6cm.  Omental adhesions halfway between the umbilicus and the area of the lwo transverse skin incision, 5-6cm to the right of the umbilicus at  and also at the area of the RLQ; minimal scar tissue at the bladder flap area.  Grossly normal uterus, surgically absent fallopian tubes, normal and small right ovary, clear ovarian cyst fluid from the left cyst.  Normal liver and stomach edge. Normal para ovarian fossae, negative posterior cul de sac; no evidence of endometriosis in the abdomen.   PROCEDURE IN DETAIL: The patient was taken to the OR where anesthesia was administed. The patient was positioned in dorsal lithotomy in the [place] patient was then examined under anesthesia with the above noted findings. The patient was prepped and draped in the normal sterile fashion and foley catheter was placed. A Graves speculum was placed in the vagina and the anterior lip of the cervix was grasped with a single toothed tenaculum.  A Hulka uterine manipulator was then inserted in the uterus and uterine mobility was found to be satisfactory; the speculum and tenaculum were then removed.  After  changing gloves, attention was turned to the patient's abdomen where a 1244mkin incision was made in the umbilical fold, after injection of local anesthesia. Using the open technique, the abdomen was entered and the balloon trocar placed; pneumoperitoneum was then obtained. The operative laparoscope was introduced into the abdomen with the above noted findings, after inspection of the entry site and then placing the patient in Trendelenburg.  A LLQ 5mm88mrt was placed under direct visualization. Examining through this port, as well, the omental adhesions were clear of any bowel, and the Ligasure device was used to lysis the adhesions in the midline below the umbilicus and some adhesions in the LLQ (enough to clearly place a 5mm 38mt).  Next, a 5mm p74m was placed in the RLQ under direct visualization and then a 5mm su26mpubic port was placed under direct visualization.  The cyst was dissected from the ovary and rupture just prior to removal; the cyst was then removed via the umbilicus.  Any fluid in the posterior cul de sac was then removed.  The cyst was hemostatic under low pressure of 6mmHg; 36msta was applied to the ovarian cyst bed.  The 5mm port21mere then removed under direct visualization.  The fascia at the umbilical incision was reapproximated with 0 vicryl. The skin was closed with 4-0 monocryl and dermabond, and the 5mm skin 4misions closed with Dermabond. . The Hulka was removed with no bleeding noted from the cervix and all other instrumentation was removed from the vagina.  The foley catheter was removed. The patient tolerated the procedure well. All counts were correct x 2. The patient was transferred to the recovery room awake,  alert and breathing independently.   Durene Romans MD Attending Center for [employer])

## 2022-04-22 NOTE — Discharge Instructions (Signed)
Laparoscopic Surgery Discharge Instructions  Instructions Following Laparoscopic Surgery You have just undergone  laparoscopic surgery.  The following list should answer your most common questions.  Although we will discuss your surgery and post-operative instructions with you prior to your discharge, this list will serve as a reminder if you fail to recall the details of what we discussed.  We will discuss your surgery once again in detail at your post-op visit in two to four weeks. If you haven't already done so, please call to make your appointment as soon as possible.  How you will feel: Although you have just undergone a major surgery, your recovery will be significantly shorter since the surgery was performed through much smaller incisions than the traditional approach.  You should feel slightly better each day.  If you suddenly feel much worse than the prior day, please call the clinic.  It's important during the early part of your recovery that you maintain some activity.  Walking is encouraged.  You will quicken your recovery by continued activity.  Incision:  Your incisions will be closed with dissolvable stitches or surgical adhesive (glue).  There may be Band-aids and/or Steri-strips covering your incisions.  If there is no drainage from the incisions you may remove the Band-aids in one to two days.  You may notice some minor bruising at the incision sites.  This is common and will resolve within several days.  Please inform us if the redness at the edges of your incision appears to be spreading.  If the skin around your incision becomes warm to the touch, or if you notice a pus-like drainage, please call the office.  Stairs/Driving/Activities: You may climb stairs if necessary.  If you've had general anesthesia, do not drive a car the rest of the day today.  You may begin light housework when you feel up to it, but avoid heavy lifting (more than 15-20lbs) or pushing until cleared for these  activities by your physician.  Hygiene:  Do not soak your incisions.  Showers are acceptable but you may not take a bath or swim in a pool.  Cleanse your incisions daily with soap and water.  Medications:  Please resume taking any medications that you were taking prior to the surgery.  If we have prescribed any new medications for you, please take them as directed.  Constipation:  It is fairly common to experience some difficulty in moving your bowels following major surgery.  Being active will help to reduce this likelihood. A diet rich in fiber and plenty of liquids is desirable.  If you do become constipated, a mild laxative such as Miralax, Milk of Magnesia, or Metamucil, or a stool softener such as Colace, is recommended.  General Instructions: If you develop a fever of 100.5 degrees or higher, please call the office number(s) below for physician on call.

## 2022-04-22 NOTE — Brief Op Note (Signed)
04/22/2022  12:23 PM  PATIENT:  Krystal [name]  32 y.o. female  PRE-OPERATIVE DIAGNOSIS:  Left Ovarian Cyst (hemorrhagic)  POST-OPERATIVE DIAGNOSIS:  Left Ovarian Cyst (hemorrhagic)  PROCEDURE:  Procedure(s): LAPAROSCOPIC LEFT OVARIAN CYSTECTOMY (Left) LAPAROSCOPIC LYSIS OF ADHESIONS  SURGEON:  Surgeon(s) and Role:    * Aletha Halim, MD - Primary  ASSISTANTS: none   ANESTHESIA:   local and general  EBL:  20 mL   BLOOD ADMINISTERED:none  DRAINS:  foley 164m UOP    LOCAL MEDICATIONS USED:  MARCAINE     SPECIMEN:  left ovarian cyst  DISPOSITION OF SPECIMEN:  PATHOLOGY  COUNTS:  YES  TOURNIQUET:  * No tourniquets in log *  DICTATION: .Note written in EPIC  PLAN OF CARE: Discharge to home after PACU  PATIENT DISPOSITION:  PACU - hemodynamically stable.   Delay start of Pharmacological VTE agent (>24hrs) due to surgical blood loss or risk of bleeding: not applicable  CDurene RomansMD [facility]ending [facility](Faculty [facility]) 04/22/2022 Time: 1225pm

## 2022-04-22 NOTE — Anesthesia Procedure Notes (Signed)
Procedure Name: Intubation Date/Time: 04/22/2022 10:43 AM  Performed by: Ester Rink, CRNAPre-anesthesia Checklist: Patient identified, Emergency Drugs available, Suction available and Patient being monitored Patient Re-evaluated:Patient Re-evaluated prior to induction Oxygen Delivery Method: Circle system utilized Preoxygenation: Pre-oxygenation with 100% oxygen Induction Type: IV induction Ventilation: Mask ventilation without difficulty and Oral airway inserted - appropriate to patient size Laryngoscope Size: Mac and 4 Grade View: Grade I Tube type: Oral Tube size: 7.0 mm Number of attempts: 1 Airway Equipment and Method: Stylet and Oral airway Placement Confirmation: ETT inserted through vocal cords under direct vision, positive ETCO2 and breath sounds checked- equal and bilateral Secured at: 22 cm Tube secured with: Tape Dental Injury: Teeth and Oropharynx as per pre-operative assessment

## 2022-04-22 NOTE — Interval H&P Note (Signed)
History and Physical Interval Note:  04/22/2022 8:49 AM  Krystal [name]  has presented today for surgery, with the diagnosis of Left Ovarian Cyst.  The various methods of treatment have been discussed with the patient and family. After consideration of risks, benefits and other options for treatment, the patient has consented to  Procedure(s): LAPAROSCOPIC OVARIAN CYSTECTOMY (Left) as a surgical intervention.  The patient's history has been reviewed, patient examined, no change in status, stable for surgery.  I have reviewed the patient's chart and labs.  Questions were answered to the patient's satisfaction.     Aletha Halim

## 2022-04-22 NOTE — Telephone Encounter (Signed)
Pharmacist left VM on nurse line requesting call back for more information. Pharmacist is very concerned due to multiple providers prescribing narcotics and patient filling narcotics at multiple locations over the past month. Returned call to pharmacist to explain pt has been seen for acute pain in both outpatient and ED settings over past month and has needed pain medication. Will need oxycodone 10 mg #6 filled today due to ovarian cystectomy surgery done today. Pharmacist states she will fill this today but will not fill any further narcotic prescription for this patient at [facility] on [facility]. Explained this is an acute issue and we do not anticipate patient needing future prescriptions; pharmacist may decline in the future if they choose.

## 2022-04-22 NOTE — Transfer of Care (Signed)
Immediate Anesthesia Transfer of Care Note  Patient: Krystal [name]  Procedure(s) Performed: LAPAROSCOPIC LEFT OVARIAN CYSTECTOMY (Left: Abdomen) LAPAROSCOPIC LYSIS OF ADHESIONS (Abdomen)  Patient Location: PACU  Anesthesia Type:General  Level of Consciousness: awake, alert , and oriented  Airway & Oxygen Therapy: Patient Spontanous Breathing  Post-op Assessment: Report given to RN and Post -op Vital signs reviewed and stable  Post vital signs: Reviewed and stable  Last Vitals:  Vitals Value Taken Time  BP 159/114 04/22/22 1238  Temp    Pulse 87 04/22/22 1241  Resp 24 04/22/22 1241  SpO2 99 % 04/22/22 1241  Vitals shown include unvalidated device data.  Last Pain:  Vitals:   04/22/22 0847  TempSrc:   PainSc: 2          Complications: There were no known notable events for this encounter.

## 2022-04-23 ENCOUNTER — Telehealth: Payer: Self-pay | Admitting: General Practice

## 2022-04-23 ENCOUNTER — Encounter (HOSPITAL_COMMUNITY): Payer: Self-pay | Admitting: Obstetrics and Gynecology

## 2022-04-23 ENCOUNTER — Inpatient Hospital Stay (HOSPITAL_COMMUNITY)
Admission: EM | Admit: 2022-04-23 | Discharge: 2022-05-05 | DRG: 309 | Disposition: A | Discharge status: Home / Self Care | Payer: Self-pay | Attending: Internal Medicine | Admitting: Internal Medicine

## 2022-04-23 ENCOUNTER — Other Ambulatory Visit: Payer: Self-pay

## 2022-04-23 DIAGNOSIS — H538 Other visual disturbances: Secondary | ICD-10-CM | POA: Diagnosis present

## 2022-04-23 DIAGNOSIS — N12 Tubulo-interstitial nephritis, not specified as acute or chronic: Secondary | ICD-10-CM | POA: Diagnosis present

## 2022-04-23 DIAGNOSIS — Z8739 Personal history of other diseases of the musculoskeletal system and connective tissue: Secondary | ICD-10-CM

## 2022-04-23 DIAGNOSIS — R001 Bradycardia, unspecified: Principal | ICD-10-CM | POA: Diagnosis present

## 2022-04-23 DIAGNOSIS — R339 Retention of urine, unspecified: Secondary | ICD-10-CM | POA: Diagnosis present

## 2022-04-23 DIAGNOSIS — R112 Nausea with vomiting, unspecified: Secondary | ICD-10-CM

## 2022-04-23 DIAGNOSIS — I447 Left bundle-branch block, unspecified: Secondary | ICD-10-CM | POA: Diagnosis present

## 2022-04-23 DIAGNOSIS — R0789 Other chest pain: Secondary | ICD-10-CM | POA: Diagnosis not present

## 2022-04-23 DIAGNOSIS — Z66 Do not resuscitate: Secondary | ICD-10-CM | POA: Diagnosis present

## 2022-04-23 DIAGNOSIS — N390 Urinary tract infection, site not specified: Secondary | ICD-10-CM | POA: Diagnosis not present

## 2022-04-23 DIAGNOSIS — H548 Legal blindness, as defined in USA: Secondary | ICD-10-CM | POA: Diagnosis present

## 2022-04-23 DIAGNOSIS — I1A0 Resistant hypertension: Secondary | ICD-10-CM | POA: Diagnosis present

## 2022-04-23 DIAGNOSIS — B9620 Unspecified Escherichia coli [E. coli] as the cause of diseases classified elsewhere: Secondary | ICD-10-CM | POA: Diagnosis not present

## 2022-04-23 DIAGNOSIS — J45909 Unspecified asthma, uncomplicated: Secondary | ICD-10-CM | POA: Diagnosis present

## 2022-04-23 DIAGNOSIS — T447X5A Adverse effect of beta-adrenoreceptor antagonists, initial encounter: Secondary | ICD-10-CM | POA: Diagnosis present

## 2022-04-23 DIAGNOSIS — Z9049 Acquired absence of other specified parts of digestive tract: Secondary | ICD-10-CM

## 2022-04-23 DIAGNOSIS — E063 Autoimmune thyroiditis: Secondary | ICD-10-CM | POA: Diagnosis present

## 2022-04-23 DIAGNOSIS — K219 Gastro-esophageal reflux disease without esophagitis: Secondary | ICD-10-CM | POA: Diagnosis present

## 2022-04-23 DIAGNOSIS — Z87442 Personal history of urinary calculi: Secondary | ICD-10-CM

## 2022-04-23 DIAGNOSIS — R000 Tachycardia, unspecified: Secondary | ICD-10-CM | POA: Diagnosis not present

## 2022-04-23 DIAGNOSIS — T83511A Infection and inflammatory reaction due to indwelling urethral catheter, initial encounter: Secondary | ICD-10-CM | POA: Diagnosis not present

## 2022-04-23 DIAGNOSIS — F32A Depression, unspecified: Secondary | ICD-10-CM | POA: Diagnosis present

## 2022-04-23 DIAGNOSIS — K760 Fatty (change of) liver, not elsewhere classified: Secondary | ICD-10-CM | POA: Diagnosis present

## 2022-04-23 DIAGNOSIS — Z885 Allergy status to narcotic agent status: Secondary | ICD-10-CM

## 2022-04-23 DIAGNOSIS — I10 Essential (primary) hypertension: Secondary | ICD-10-CM | POA: Diagnosis present

## 2022-04-23 DIAGNOSIS — Z79899 Other long term (current) drug therapy: Secondary | ICD-10-CM

## 2022-04-23 DIAGNOSIS — E038 Other specified hypothyroidism: Secondary | ICD-10-CM | POA: Diagnosis present

## 2022-04-23 DIAGNOSIS — N3010 Interstitial cystitis (chronic) without hematuria: Secondary | ICD-10-CM | POA: Diagnosis present

## 2022-04-23 DIAGNOSIS — E739 Lactose intolerance, unspecified: Secondary | ICD-10-CM | POA: Diagnosis present

## 2022-04-23 DIAGNOSIS — T402X5A Adverse effect of other opioids, initial encounter: Secondary | ICD-10-CM | POA: Diagnosis not present

## 2022-04-23 DIAGNOSIS — K5903 Drug induced constipation: Secondary | ICD-10-CM | POA: Diagnosis not present

## 2022-04-23 DIAGNOSIS — F1290 Cannabis use, unspecified, uncomplicated: Secondary | ICD-10-CM | POA: Diagnosis present

## 2022-04-23 DIAGNOSIS — Z823 Family history of stroke: Secondary | ICD-10-CM

## 2022-04-23 DIAGNOSIS — F603 Borderline personality disorder: Secondary | ICD-10-CM | POA: Diagnosis present

## 2022-04-23 DIAGNOSIS — Z89112 Acquired absence of left hand: Secondary | ICD-10-CM

## 2022-04-23 DIAGNOSIS — F419 Anxiety disorder, unspecified: Secondary | ICD-10-CM | POA: Diagnosis present

## 2022-04-23 DIAGNOSIS — G8929 Other chronic pain: Secondary | ICD-10-CM | POA: Diagnosis present

## 2022-04-23 DIAGNOSIS — K529 Noninfective gastroenteritis and colitis, unspecified: Secondary | ICD-10-CM

## 2022-04-23 DIAGNOSIS — Z888 Allergy status to other drugs, medicaments and biological substances status: Secondary | ICD-10-CM

## 2022-04-23 DIAGNOSIS — E871 Hypo-osmolality and hyponatremia: Secondary | ICD-10-CM | POA: Diagnosis not present

## 2022-04-23 DIAGNOSIS — F329 Major depressive disorder, single episode, unspecified: Secondary | ICD-10-CM | POA: Diagnosis present

## 2022-04-23 DIAGNOSIS — Z79890 Hormone replacement therapy (postmenopausal): Secondary | ICD-10-CM

## 2022-04-23 DIAGNOSIS — G8918 Other acute postprocedural pain: Principal | ICD-10-CM

## 2022-04-23 DIAGNOSIS — Z6833 Body mass index (BMI) 33.0-33.9, adult: Secondary | ICD-10-CM

## 2022-04-23 DIAGNOSIS — F4310 Post-traumatic stress disorder, unspecified: Secondary | ICD-10-CM | POA: Diagnosis present

## 2022-04-23 DIAGNOSIS — E860 Dehydration: Secondary | ICD-10-CM | POA: Diagnosis present

## 2022-04-23 DIAGNOSIS — E2740 Unspecified adrenocortical insufficiency: Secondary | ICD-10-CM

## 2022-04-23 DIAGNOSIS — Z98891 History of uterine scar from previous surgery: Secondary | ICD-10-CM

## 2022-04-23 DIAGNOSIS — Z7951 Long term (current) use of inhaled steroids: Secondary | ICD-10-CM

## 2022-04-23 DIAGNOSIS — Y92239 Unspecified place in hospital as the place of occurrence of the external cause: Secondary | ICD-10-CM | POA: Diagnosis not present

## 2022-04-23 DIAGNOSIS — K648 Other hemorrhoids: Secondary | ICD-10-CM | POA: Diagnosis present

## 2022-04-23 DIAGNOSIS — G43109 Migraine with aura, not intractable, without status migrainosus: Secondary | ICD-10-CM | POA: Diagnosis present

## 2022-04-23 DIAGNOSIS — E669 Obesity, unspecified: Secondary | ICD-10-CM | POA: Diagnosis present

## 2022-04-23 DIAGNOSIS — E2749 Other adrenocortical insufficiency: Secondary | ICD-10-CM | POA: Diagnosis present

## 2022-04-23 DIAGNOSIS — R1030 Lower abdominal pain, unspecified: Secondary | ICD-10-CM

## 2022-04-23 DIAGNOSIS — R109 Unspecified abdominal pain: Secondary | ICD-10-CM | POA: Diagnosis present

## 2022-04-23 DIAGNOSIS — Z8249 Family history of ischemic heart disease and other diseases of the circulatory system: Secondary | ICD-10-CM

## 2022-04-23 LAB — SURGICAL PATHOLOGY

## 2022-04-23 NOTE — Telephone Encounter (Signed)
Patient called into front office stating she had surgery yesterday and is in severe pain today. She reports excruciating pain in her mid back around her kidneys. She states she cannot pee and hasn't peed since 730 this morning. She states she tried to pee but hardly anything comes out and it is extremely painful. She has also been throwing up all morning. Reports her back pain at a 8. Recommended she go to the ER for evaluation as what she is experiencing is not normal. Patient verbalized understanding.

## 2022-04-23 NOTE — ED Notes (Signed)
Pt noted to have pulse rate of 38 in lobby. Pt brought back to triage to be reassessed by RN and PA. Pt alert but stated she did feel dizzy. EKG captured and vitals taken. Second phlebotomist attempted to get blood x2 unsuccessfully. Pt taken back to room.

## 2022-04-23 NOTE — ED Provider Triage Note (Signed)
Emergency Medicine Provider Triage Evaluation Note  Krystal [name] , [name] 32 y.o. female  was evaluated in triage.  Pt complains of lower abdominal pain and back pain has been worsening since this morning.  Notes has been going on for approximately 3 weeks.  Also notes difficulty with urinating.  Has associated nausea and vomiting.  Patient was instructed to come to the emergency department due to her symptoms.  Patient had [name] laparoscopic left ovarian cystectomy completed on yesterday.  Review of Systems  Positive:  Negative:   Physical Exam  BP (!) 141/72   Pulse (!) 57   Temp 98.4 F (36.9 C)   Resp 20   Ht '5\' 11"'$  (1.803 m)   Wt 108 kg   LMP 04/13/2022 (Exact Date) Comment: DOS UPREG NEGATIVE  SpO2 98%   BMI 33.19 kg/m  Gen:   Awake, no distress   Resp:  Normal effort  MSK:   Moves extremities without difficulty  Other:  Diffuse abdominal tenderness to palpation  Medical Decision Making  Medically screening exam initiated at 5:51 PM.  Appropriate orders placed.  Krystal [name] was informed that the remainder of the evaluation will be completed by another provider, this initial triage assessment does not replace that evaluation, and the importance of remaining in the ED until their evaluation is complete.  Workup initiated   Krystal [name] A, PA-C 04/23/22 1754

## 2022-04-23 NOTE — ED Triage Notes (Signed)
Pt c/o lower abdominal and back pain worsened this morning. Pt states she has had this pain for approx 3 weeks but had procedure to remove cyst on left ovary yeserday. Pt states she has been having difficulty urinating today also.

## 2022-04-23 NOTE — Anesthesia Postprocedure Evaluation (Signed)
Anesthesia Post Note  Patient: Krystal [name]  Procedure(s) Performed: LAPAROSCOPIC LEFT OVARIAN CYSTECTOMY (Left: Abdomen) LAPAROSCOPIC LYSIS OF ADHESIONS (Abdomen)     Patient location during evaluation: PACU Anesthesia Type: General Level of consciousness: awake and alert Pain management: pain level controlled Vital Signs Assessment: post-procedure vital signs reviewed and stable Respiratory status: spontaneous breathing, nonlabored ventilation, respiratory function stable and patient connected to nasal cannula oxygen Cardiovascular status: blood pressure returned to baseline and stable Postop Assessment: no apparent nausea or vomiting Anesthetic complications: no   There were no known notable events for this encounter.  Last Vitals:  Vitals:   04/22/22 1415 04/22/22 1430  BP: 135/71 126/63  Pulse: 71 74  Resp: 12 15  Temp:  36.6 C  SpO2: 98% 98%    Last Pain:  Vitals:   04/22/22 1438  TempSrc:   PainSc: [place] Dariush Mcnellis

## 2022-04-23 NOTE — ED Notes (Signed)
RN attempt to place PIV with unsuccessful attempt. IV team paged

## 2022-04-24 ENCOUNTER — Emergency Department (HOSPITAL_COMMUNITY): Payer: Self-pay

## 2022-04-24 DIAGNOSIS — I1A0 Resistant hypertension: Secondary | ICD-10-CM

## 2022-04-24 DIAGNOSIS — R001 Bradycardia, unspecified: Secondary | ICD-10-CM | POA: Diagnosis present

## 2022-04-24 LAB — URINALYSIS, ROUTINE W REFLEX MICROSCOPIC
Bilirubin Urine: NEGATIVE
Glucose, UA: NEGATIVE mg/dL
Hgb urine dipstick: NEGATIVE
Ketones, ur: NEGATIVE mg/dL
Leukocytes,Ua: NEGATIVE
Nitrite: NEGATIVE
Protein, ur: NEGATIVE mg/dL
Specific Gravity, Urine: 1.011 (ref 1.005–1.030)
pH: 6 (ref 5.0–8.0)

## 2022-04-24 LAB — CBC WITH DIFFERENTIAL/PLATELET
Abs Immature Granulocytes: 0.02 10*3/uL (ref 0.00–0.07)
Basophils Absolute: 0 10*3/uL (ref 0.0–0.1)
Basophils Relative: 0 %
Eosinophils Absolute: 0 10*3/uL (ref 0.0–0.5)
Eosinophils Relative: 0 %
HCT: 36.1 % (ref 36.0–46.0)
Hemoglobin: 12.4 g/dL (ref 12.0–15.0)
Immature Granulocytes: 0 %
Lymphocytes Relative: 25 %
Lymphs Abs: 2.3 10*3/uL (ref 0.7–4.0)
MCH: 32.5 pg (ref 26.0–34.0)
MCHC: 34.3 g/dL (ref 30.0–36.0)
MCV: 94.8 fL (ref 80.0–100.0)
Monocytes Absolute: 0.6 10*3/uL (ref 0.1–1.0)
Monocytes Relative: 6 %
Neutro Abs: 6.3 10*3/uL (ref 1.7–7.7)
Neutrophils Relative %: 69 %
Platelets: 262 10*3/uL (ref 150–400)
RBC: 3.81 MIL/uL — ABNORMAL LOW (ref 3.87–5.11)
RDW: 12.4 % (ref 11.5–15.5)
WBC: 9.2 10*3/uL (ref 4.0–10.5)
nRBC: 0 % (ref 0.0–0.2)

## 2022-04-24 LAB — COMPREHENSIVE METABOLIC PANEL
ALT: 21 U/L (ref 0–44)
AST: 19 U/L (ref 15–41)
Albumin: 4.1 g/dL (ref 3.5–5.0)
Alkaline Phosphatase: 41 U/L (ref 38–126)
Anion gap: 9 (ref 5–15)
BUN: 16 mg/dL (ref 6–20)
CO2: 27 mmol/L (ref 22–32)
Calcium: 9.3 mg/dL (ref 8.9–10.3)
Chloride: 103 mmol/L (ref 98–111)
Creatinine, Ser: 1.03 mg/dL — ABNORMAL HIGH (ref 0.44–1.00)
GFR, Estimated: >60 mL/min (ref >60)
Glucose, Bld: 118 mg/dL — ABNORMAL HIGH (ref 70–99)
Potassium: 3.8 mmol/L (ref 3.5–5.1)
Sodium: 139 mmol/L (ref 135–145)
Total Bilirubin: 0.7 mg/dL (ref 0.3–1.2)
Total Protein: 6.7 g/dL (ref 6.5–8.1)

## 2022-04-24 LAB — I-STAT BETA HCG BLOOD, ED (MC, WL, AP ONLY): I-stat hCG, quantitative: <5 m[IU]/mL (ref <5)

## 2022-04-24 LAB — LIPASE, BLOOD: Lipase: 26 U/L (ref 11–51)

## 2022-04-24 LAB — T4, FREE: Free T4: 0.71 ng/dL (ref 0.61–1.12)

## 2022-04-24 LAB — LACTIC ACID, PLASMA: Lactic Acid, Venous: 1.6 mmol/L (ref 0.5–1.9)

## 2022-04-24 LAB — PHOSPHORUS: Phosphorus: 2.4 mg/dL — ABNORMAL LOW (ref 2.5–4.6)

## 2022-04-24 LAB — TSH: TSH: 9.236 u[IU]/mL — ABNORMAL HIGH (ref 0.350–4.500)

## 2022-04-24 LAB — HIV ANTIBODY (ROUTINE TESTING W REFLEX): HIV Screen 4th Generation wRfx: NONREACTIVE

## 2022-04-24 MED ORDER — CLONAZEPAM 0.5 MG PO TABS
1.0000 mg | ORAL_TABLET | Freq: Three times a day (TID) | ORAL | Status: DC | PRN
  Administered 2022-04-25 – 2022-05-03 (×14): 1 mg via ORAL
  Filled 2022-04-24 (×18): qty 2

## 2022-04-24 MED ORDER — LEVOTHYROXINE SODIUM 75 MCG PO TABS
150.0000 ug | ORAL_TABLET | Freq: Every day | ORAL | Status: DC
  Administered 2022-04-25 – 2022-05-05 (×11): 150 ug via ORAL
  Filled 2022-04-24 (×12): qty 2

## 2022-04-24 MED ORDER — HYDROMORPHONE HCL 1 MG/ML IJ SOLN
1.0000 mg | INTRAMUSCULAR | Status: DC | PRN
  Administered 2022-04-24 – 2022-04-27 (×11): 1 mg via INTRAVENOUS
  Filled 2022-04-24 (×11): qty 1

## 2022-04-24 MED ORDER — MOMETASONE FURO-FORMOTEROL FUM 100-5 MCG/ACT IN AERO
2.0000 | INHALATION_SPRAY | Freq: Two times a day (BID) | RESPIRATORY_TRACT | Status: DC
  Administered 2022-04-25 – 2022-05-05 (×18): 2 via RESPIRATORY_TRACT
  Filled 2022-04-24: qty 8.8

## 2022-04-24 MED ORDER — POLYETHYLENE GLYCOL 3350 17 G PO PACK
17.0000 g | PACK | Freq: Two times a day (BID) | ORAL | Status: DC
  Administered 2022-04-24 – 2022-04-29 (×9): 17 g via ORAL
  Filled 2022-04-24 (×10): qty 1

## 2022-04-24 MED ORDER — VENLAFAXINE HCL ER 75 MG PO CP24
75.0000 mg | ORAL_CAPSULE | Freq: Every day | ORAL | Status: DC
  Administered 2022-04-25 – 2022-05-05 (×9): 75 mg via ORAL
  Filled 2022-04-24 (×12): qty 1

## 2022-04-24 MED ORDER — ACETAMINOPHEN 325 MG PO TABS: 650.0000 mg | ORAL_TABLET | Freq: Four times a day (QID) | ORAL | Status: DC | PRN

## 2022-04-24 MED ORDER — MORPHINE SULFATE (PF) 4 MG/ML IV SOLN
4.0000 mg | Freq: Once | INTRAVENOUS | Status: AC
  Administered 2022-04-24: 4 mg via INTRAVENOUS
  Filled 2022-04-24: qty 1

## 2022-04-24 MED ORDER — FLUOXETINE HCL 20 MG PO CAPS
20.0000 mg | ORAL_CAPSULE | Freq: Every day | ORAL | Status: DC
  Administered 2022-04-24 – 2022-05-05 (×11): 20 mg via ORAL
  Filled 2022-04-24 (×12): qty 1

## 2022-04-24 MED ORDER — OXYCODONE-ACETAMINOPHEN 5-325 MG PO TABS
1.0000 | ORAL_TABLET | Freq: Once | ORAL | Status: AC
  Administered 2022-04-24: 1 via ORAL
  Filled 2022-04-24: qty 1

## 2022-04-24 MED ORDER — DIPHENHYDRAMINE HCL 25 MG PO CAPS
25.0000 mg | ORAL_CAPSULE | Freq: Once | ORAL | Status: AC
  Administered 2022-04-24: 25 mg via ORAL
  Filled 2022-04-24: qty 1

## 2022-04-24 MED ORDER — LURASIDONE HCL 20 MG PO TABS
20.0000 mg | ORAL_TABLET | Freq: Every day | ORAL | Status: DC
  Administered 2022-04-24 – 2022-04-25 (×2): 20 mg via ORAL
  Filled 2022-04-24 (×3): qty 1

## 2022-04-24 MED ORDER — HYDROMORPHONE HCL 1 MG/ML IJ SOLN
1.0000 mg | Freq: Four times a day (QID) | INTRAMUSCULAR | Status: DC | PRN
  Administered 2022-04-24: 1 mg via INTRAVENOUS
  Filled 2022-04-24: qty 1

## 2022-04-24 MED ORDER — ONDANSETRON HCL 4 MG/2ML IJ SOLN
4.0000 mg | Freq: Four times a day (QID) | INTRAMUSCULAR | Status: DC | PRN
  Administered 2022-04-24 – 2022-04-29 (×13): 4 mg via INTRAVENOUS
  Filled 2022-04-24 (×13): qty 2

## 2022-04-24 MED ORDER — HYDROMORPHONE HCL 1 MG/ML IJ SOLN
1.0000 mg | Freq: Once | INTRAMUSCULAR | Status: AC
  Administered 2022-04-24: 1 mg via INTRAVENOUS
  Filled 2022-04-24: qty 1

## 2022-04-24 MED ORDER — ONDANSETRON HCL 4 MG PO TABS: 4.0000 mg | ORAL_TABLET | Freq: Four times a day (QID) | ORAL | Status: DC | PRN

## 2022-04-24 MED ORDER — ALBUTEROL SULFATE (2.5 MG/3ML) 0.083% IN NEBU: 2.5000 mg | INHALATION_SOLUTION | RESPIRATORY_TRACT | Status: DC | PRN

## 2022-04-24 MED ORDER — GABAPENTIN 300 MG PO CAPS
300.0000 mg | ORAL_CAPSULE | Freq: Two times a day (BID) | ORAL | Status: DC
  Administered 2022-04-24 – 2022-04-29 (×10): 300 mg via ORAL
  Filled 2022-04-24 (×11): qty 1

## 2022-04-24 MED ORDER — ACETAMINOPHEN 650 MG RE SUPP: 650.0000 mg | Freq: Four times a day (QID) | RECTAL | Status: DC | PRN

## 2022-04-24 MED ORDER — ONDANSETRON HCL 4 MG/2ML IJ SOLN
4.0000 mg | Freq: Once | INTRAMUSCULAR | Status: AC
  Administered 2022-04-24: 4 mg via INTRAVENOUS
  Filled 2022-04-24: qty 2

## 2022-04-24 MED ORDER — OXYCODONE HCL 5 MG PO TABS
5.0000 mg | ORAL_TABLET | Freq: Four times a day (QID) | ORAL | Status: DC | PRN
  Administered 2022-04-24 – 2022-04-25 (×3): 5 mg via ORAL
  Filled 2022-04-24 (×3): qty 1

## 2022-04-24 MED ORDER — ACETAMINOPHEN 500 MG PO TABS
1000.0000 mg | ORAL_TABLET | Freq: Three times a day (TID) | ORAL | Status: DC
  Administered 2022-04-24 – 2022-05-05 (×31): 1000 mg via ORAL
  Filled 2022-04-24 (×33): qty 2

## 2022-04-24 MED ORDER — IOHEXOL 350 MG/ML SOLN
100.0000 mL | Freq: Once | INTRAVENOUS | Status: AC | PRN
  Administered 2022-04-24: 100 mL via INTRAVENOUS

## 2022-04-24 MED ORDER — LACTATED RINGERS IV SOLN
INTRAVENOUS | Status: AC
  Administered 2022-04-24: 1000 mL via INTRAVENOUS

## 2022-04-24 MED ORDER — ENOXAPARIN SODIUM 60 MG/0.6ML IJ SOSY
50.0000 mg | PREFILLED_SYRINGE | INTRAMUSCULAR | Status: DC
  Administered 2022-04-24 – 2022-04-30 (×7): 50 mg via SUBCUTANEOUS
  Filled 2022-04-24 (×7): qty 0.6

## 2022-04-24 MED ORDER — HYDRALAZINE HCL 20 MG/ML IJ SOLN
10.0000 mg | Freq: Four times a day (QID) | INTRAMUSCULAR | Status: DC | PRN
  Administered 2022-05-01 – 2022-05-04 (×4): 10 mg via INTRAVENOUS
  Filled 2022-04-24 (×4): qty 1

## 2022-04-24 NOTE — Plan of Care (Signed)

## 2022-04-24 NOTE — Progress Notes (Signed)
Pt HR is in the 30s. Spoke with [place], MD. Verbal order to decrease telemetry parameters to 30.

## 2022-04-24 NOTE — Plan of Care (Signed)
  Problem: Education: Goal: Knowledge of General Education information will improve Description: Including pain rating scale, medication(s)/side effects and non-pharmacologic comfort measures Outcome: Progressing   Problem: Safety: Goal: Ability to remain free from injury will improve Outcome: Progressing   

## 2022-04-24 NOTE — ED Provider Notes (Signed)
Care of the patient received from [facility].  Please refer to full HPI.  In short 32 year old female presenting with abdominal pain after a ovarian cystectomy.  History of interstitial cystitis.  She was given oxycodone at home but this has not been helping her symptoms.  Her workup was overall reassuring, her CT scan showed some free air though this is likely consistent with recent procedure.  She was retaining urine and had a Foley placed and voided almost a liter of urine.  She was given multiple rounds of morphine, Percocet, and Dilaudid and still having severe pain.  He was also noted to be with new onset bradycardia, EKG consistent with sinus bradycardia.  Plan for admission but care signed out pending cardiology consultation.  I discussed the patient's case with [facility] who will come evaluate the patient.  Consulted hospitalist team will admit her.  She was given additional dose of Dilaudid. Results for orders placed or performed during the hospital encounter of 04/23/22  CBC with Differential  Result Value Ref Range   WBC 9.2 4.0 - 10.5 K/uL   RBC 3.81 (L) 3.87 - 5.11 MIL/uL   Hemoglobin 12.4 12.0 - 15.0 g/dL   HCT 36.1 36.0 - 46.0 %   MCV 94.8 80.0 - 100.0 fL   MCH 32.5 26.0 - 34.0 pg   MCHC 34.3 30.0 - 36.0 g/dL   RDW 12.4 11.5 - 15.5 %   Platelets 262 150 - 400 K/uL   nRBC 0.0 0.0 - 0.2 %   Neutrophils Relative % 69 %   Neutro Abs 6.3 1.7 - 7.7 K/uL   Lymphocytes Relative 25 %   Lymphs Abs 2.3 0.7 - 4.0 K/uL   Monocytes Relative 6 %   Monocytes Absolute 0.6 0.1 - 1.0 K/uL   Eosinophils Relative 0 %   Eosinophils Absolute 0.0 0.0 - 0.5 K/uL   Basophils Relative 0 %   Basophils Absolute 0.0 0.0 - 0.1 K/uL   Immature Granulocytes 0 %   Abs Immature Granulocytes 0.02 0.00 - 0.07 K/uL  Comprehensive metabolic panel  Result Value Ref Range   Sodium 139 135 - 145 mmol/L   Potassium 3.8 3.5 - 5.1 mmol/L   Chloride 103 98 - 111 mmol/L   CO2 27 22 - 32 mmol/L   Glucose,  Bld 118 (H) 70 - 99 mg/dL   BUN 16 6 - 20 mg/dL   Creatinine, Ser 1.03 (H) 0.44 - 1.00 mg/dL   Calcium 9.3 8.9 - 10.3 mg/dL   Total Protein 6.7 6.5 - 8.1 g/dL   Albumin 4.1 3.5 - 5.0 g/dL   AST 19 15 - 41 U/L   ALT 21 0 - 44 U/L   Alkaline Phosphatase 41 38 - 126 U/L   Total Bilirubin 0.7 0.3 - 1.2 mg/dL   GFR, Estimated >60 >60 mL/min   Anion gap 9 5 - 15  Lipase, blood  Result Value Ref Range   Lipase 26 11 - 51 U/L  Urinalysis, Routine w reflex microscopic -Urine, Clean Catch  Result Value Ref Range   Color, Urine YELLOW YELLOW   APPearance CLEAR CLEAR   Specific Gravity, Urine 1.011 1.005 - 1.030   pH 6.0 5.0 - 8.0   Glucose, UA NEGATIVE NEGATIVE mg/dL   Hgb urine dipstick NEGATIVE NEGATIVE   Bilirubin Urine NEGATIVE NEGATIVE   Ketones, ur NEGATIVE NEGATIVE mg/dL   Protein, ur NEGATIVE NEGATIVE mg/dL   Nitrite NEGATIVE NEGATIVE   Leukocytes,Ua NEGATIVE NEGATIVE  Lactic  acid, plasma  Result Value Ref Range   Lactic Acid, Venous 1.6 0.5 - 1.9 mmol/L  I-Stat beta hCG blood, ED  Result Value Ref Range   I-stat hCG, quantitative <5.0 <5 mIU/mL   Comment 3           CT ABDOMEN PELVIS W CONTRAST  Result Date: 04/24/2022 CLINICAL DATA:  Postop abdominal pain. Procedure to remove cyst on left ovary yesterday. Difficulty urinating. EXAM: CT ABDOMEN AND PELVIS WITH CONTRAST TECHNIQUE: Multidetector CT imaging of the abdomen and pelvis was performed using the standard protocol following bolus administration of intravenous contrast. RADIATION DOSE REDUCTION: This exam was performed according to the departmental dose-optimization program which includes automated exposure control, adjustment of the mA and/or kV according to patient size and/or use of iterative reconstruction technique. CONTRAST:  116m OMNIPAQUE IOHEXOL 350 MG/ML SOLN COMPARISON:  04/12/2022. FINDINGS: Lower chest: Mild emphysematous changes are present in the lungs. There is atelectasis at the lung bases. Hepatobiliary:  No focal liver abnormality is seen. Mild fatty infiltration of the liver is noted. Status post cholecystectomy. No biliary dilatation. Pancreas: Unremarkable. No pancreatic ductal dilatation or surrounding inflammatory changes. Spleen: Normal in size without focal abnormality. Adrenals/Urinary Tract: The adrenal glands are within normal limits. The kidneys enhance symmetrically. No renal calculus or hydronephrosis. A Foley catheter is present in the urinary bladder in the bladder is decompressed. Stomach/Bowel: Stomach is within normal limits. Appendix is not seen. No evidence of bowel wall thickening, distention, or inflammatory changes. Vascular/Lymphatic: No significant vascular findings are present. No enlarged abdominal or pelvic lymph nodes. Reproductive: The uterus is in-situ.  No adnexal mass. Other: A small amount of free fluid is present in the cul-de-sac. Trace amount of free air is noted in the upper abdomen and at the umbilicus in low anterior abdominal wall subcutaneous tissues, likely related to recent surgery. No abscess or hematoma is seen. Musculoskeletal: No acute osseous abnormality. IMPRESSION: 1. Foci of free air in the upper abdomen, umbilicus, and low anterior abdominal wall, likely related to history of recent surgery. No hematoma or abscess is seen. 2. Trace amount of free fluid in the cul-de-sac. 3. Hepatic steatosis. 4. Foley catheter in the urinary bladder. Electronically Signed   By: LBrett FairyM.D.   On: 04/24/2022 04:37   [place] Complete  Result Date: 04/16/2022 CLINICAL DATA:  Pelvic pain for 2 weeks. History of left ovarian cyst rule out torsion. EXAM: TRANSABDOMINAL AND TRANSVAGINAL ULTRASOUND OF PELVIS DOPPLER ULTRASOUND OF OVARIES TECHNIQUE: Both transabdominal and transvaginal ultrasound examinations of the pelvis were performed. Transabdominal technique was performed for global imaging of the pelvis including uterus, ovaries, adnexal regions, and pelvic cul-de-sac. It  was necessary to proceed with endovaginal exam following the transabdominal exam to visualize the ovaries and endometrium. Color and duplex Doppler ultrasound was utilized to evaluate blood flow to the ovaries. COMPARISON:  04/13/2022 FINDINGS: Uterus Measurements: 7.6 x 3.7 x 5.9 cm = volume: 87 mL. Uterus is anteverted. No myometrial mass lesions. Endometrium Thickness: 9 mm.  No focal abnormality visualized. Right ovary Measurements: 2.6 x 1.4 x 1.9 cm = volume: 4 mL. Normal appearance/no adnexal mass. Left ovary Measurements: 6.7 x 4.7 x 5.2 cm = volume: 87 mL. Left ovarian cyst is again demonstrated, measuring 4.9 x 6 x 4.6 cm. Asymmetric nodular area along the cyst wall. No flow is demonstrated suggesting probably layering debris or internal hemorrhage. Appearance is unchanged since prior study. Size measurements are similar. Pulsed Doppler evaluation of both ovaries  demonstrates normal low-resistance arterial and venous waveforms. Other findings Minimal free fluid seen in the left adnexal region. IMPRESSION: Again demonstrated is a 6 cm max diameter left ovarian cyst with internal debris. This is unchanged in appearance since prior study, probably representing a hemorrhagic cyst. As previously recommended, ultrasound in 6 weeks is suggested to ensure resolution. No evidence of ovarian torsion. Electronically Signed   By: Lucienne Capers M.D.   On: 04/16/2022 19:11   US Transvaginal Non-OB  Result Date: 04/16/2022 CLINICAL DATA:  Pelvic pain for 2 weeks. History of left ovarian cyst rule out torsion. EXAM: TRANSABDOMINAL AND TRANSVAGINAL ULTRASOUND OF PELVIS DOPPLER ULTRASOUND OF OVARIES TECHNIQUE: Both transabdominal and transvaginal ultrasound examinations of the pelvis were performed. Transabdominal technique was performed for global imaging of the pelvis including uterus, ovaries, adnexal regions, and pelvic cul-de-sac. It was necessary to proceed with endovaginal exam following the transabdominal  exam to visualize the ovaries and endometrium. Color and duplex Doppler ultrasound was utilized to evaluate blood flow to the ovaries. COMPARISON:  04/13/2022 FINDINGS: Uterus Measurements: 7.6 x 3.7 x 5.9 cm = volume: 87 mL. Uterus is anteverted. No myometrial mass lesions. Endometrium Thickness: 9 mm.  No focal abnormality visualized. Right ovary Measurements: 2.6 x 1.4 x 1.9 cm = volume: 4 mL. Normal appearance/no adnexal mass. Left ovary Measurements: 6.7 x 4.7 x 5.2 cm = volume: 87 mL. Left ovarian cyst is again demonstrated, measuring 4.9 x 6 x 4.6 cm. Asymmetric nodular area along the cyst wall. No flow is demonstrated suggesting probably layering debris or internal hemorrhage. Appearance is unchanged since prior study. Size measurements are similar. Pulsed Doppler evaluation of both ovaries demonstrates normal low-resistance arterial and venous waveforms. Other findings Minimal free fluid seen in the left adnexal region. IMPRESSION: Again demonstrated is a 6 cm max diameter left ovarian cyst with internal debris. This is unchanged in appearance since prior study, probably representing a hemorrhagic cyst. As previously recommended, ultrasound in 6 weeks is suggested to ensure resolution. No evidence of ovarian torsion. Electronically Signed   By: Lucienne Capers M.D.   On: 04/16/2022 19:11   [place] Art/Ven Flow Abd Pelv Doppler  Result Date: 04/16/2022 CLINICAL DATA:  Pelvic pain for 2 weeks. History of left ovarian cyst rule out torsion. EXAM: TRANSABDOMINAL AND TRANSVAGINAL ULTRASOUND OF PELVIS DOPPLER ULTRASOUND OF OVARIES TECHNIQUE: Both transabdominal and transvaginal ultrasound examinations of the pelvis were performed. Transabdominal technique was performed for global imaging of the pelvis including uterus, ovaries, adnexal regions, and pelvic cul-de-sac. It was necessary to proceed with endovaginal exam following the transabdominal exam to visualize the ovaries and endometrium. Color and duplex  Doppler ultrasound was utilized to evaluate blood flow to the ovaries. COMPARISON:  04/13/2022 FINDINGS: Uterus Measurements: 7.6 x 3.7 x 5.9 cm = volume: 87 mL. Uterus is anteverted. No myometrial mass lesions. Endometrium Thickness: 9 mm.  No focal abnormality visualized. Right ovary Measurements: 2.6 x 1.4 x 1.9 cm = volume: 4 mL. Normal appearance/no adnexal mass. Left ovary Measurements: 6.7 x 4.7 x 5.2 cm = volume: 87 mL. Left ovarian cyst is again demonstrated, measuring 4.9 x 6 x 4.6 cm. Asymmetric nodular area along the cyst wall. No flow is demonstrated suggesting probably layering debris or internal hemorrhage. Appearance is unchanged since prior study. Size measurements are similar. Pulsed Doppler evaluation of both ovaries demonstrates normal low-resistance arterial and venous waveforms. Other findings Minimal free fluid seen in the left adnexal region. IMPRESSION: Again demonstrated is a 6 cm max diameter  left ovarian cyst with internal debris. This is unchanged in appearance since prior study, probably representing a hemorrhagic cyst. As previously recommended, ultrasound in 6 weeks is suggested to ensure resolution. No evidence of ovarian torsion. Electronically Signed   By: Lucienne Capers M.D.   On: 04/16/2022 19:11   US PELVIC COMPLETE W TRANSVAGINAL AND TORSION R/O  Result Date: 04/13/2022 CLINICAL DATA:  Pelvic pain EXAM: TRANSABDOMINAL AND TRANSVAGINAL ULTRASOUND OF PELVIS DOPPLER ULTRASOUND OF OVARIES TECHNIQUE: Both transabdominal and transvaginal ultrasound examinations of the pelvis were performed. Transabdominal technique was performed for global imaging of the pelvis including uterus, ovaries, adnexal regions, and pelvic cul-de-sac. It was necessary to proceed with endovaginal exam following the transabdominal exam to visualize the ovaries. Color and duplex Doppler ultrasound was utilized to evaluate blood flow to the ovaries. COMPARISON:  Ultrasound from the previous day. FINDINGS:  Uterus Measurements: 7.5 x 4.0 x 5.1 cm. = volume: 80 mL. No fibroids or other mass visualized. Endometrium Thickness: 4.5 mm. Minimal fluid is noted within the endometrial canal. Right ovary Not visualized similar to that seen on the previous day. Left ovary Measurements: 7.1 x 5.4 x 5.3 cm. = volume: 106 mL. 5.9 cm predominately hypoechoic cyst is noted within the left ovary similar to that seen on the prior exam with some layering debris. This is again most consistent with a hemorrhagic cyst and stable from the previous day. Pulsed Doppler evaluation of left ovary demonstrates normal low-resistance arterial and venous waveforms. Other findings No abnormal free fluid. IMPRESSION: Stable cystic lesion within the left ovary likely representing a hemorrhagic cyst. No findings to suggest ovarian torsion are noted. Follow-up continues as previously described. Electronically Signed   By: Inez Catalina M.D.   On: 04/13/2022 21:39   US PELVIC COMPLETE W TRANSVAGINAL AND TORSION R/O  Result Date: 04/12/2022 CLINICAL DATA:  Severe left lower quadrant pain with vaginal bleeding for 1-1/2 weeks, ovarian cyst identified by CT EXAM: TRANSABDOMINAL AND TRANSVAGINAL ULTRASOUND OF PELVIS DOPPLER ULTRASOUND OF OVARIES TECHNIQUE: Both transabdominal and transvaginal ultrasound examinations of the pelvis were performed. Transabdominal technique was performed for global imaging of the pelvis including uterus, ovaries, adnexal regions, and pelvic cul-de-sac. It was necessary to proceed with endovaginal exam following the transabdominal exam to visualize the uterus, ovaries, endometrium, and adnexa. Color and duplex Doppler ultrasound was utilized to evaluate blood flow to the ovaries. COMPARISON:  Same-day CT abdomen pelvis FINDINGS: Uterus Measurements: 7.7 x 3.4 x 4.8 cm = volume: 66 mL. No fibroids or other mass visualized. Endometrium Thickness: 0.6 cm.  No focal abnormality visualized. Right ovary Not visualized. Left ovary  Measurements: 6.3 x 6.2 x 5.8 cm = volume: 117 mL. The ovary is enlarged by a hypoechoic cyst with some layering internal debris measuring 6.2 x 5.3 x 5.4 cm. Pulsed Doppler evaluation of the left ovary demonstrates normal low-resistance arterial and venous waveforms. Other findings Trace free fluid in the low pelvis. IMPRESSION: 1. The left ovary is enlarged by a hypoechoic cyst with some layering internal debris measuring 6.2 x 5.3 x 5.4 cm. Findings are most consistent with a hemorrhagic cyst. Recommend follow-up ultrasound in 6 weeks to ensure resolution. 2. Right ovary is not visualized by ultrasound. 3. Trace free fluid in the low pelvis. 4. Normal arterial and venous Doppler flow is present to the left ovary. Electronically Signed   By: Delanna Ahmadi M.D.   On: 04/12/2022 16:51   CT Renal Stone Study  Result Date: 04/12/2022 CLINICAL DATA:  Left lower quadrant/flank pain for 3 days. Nausea and vomiting. EXAM: CT ABDOMEN AND PELVIS WITHOUT CONTRAST TECHNIQUE: Multidetector CT imaging of the abdomen and pelvis was performed following the standard protocol without IV contrast. RADIATION DOSE REDUCTION: This exam was performed according to the departmental dose-optimization program which includes automated exposure control, adjustment of the mA and/or kV according to patient size and/or use of iterative reconstruction technique. COMPARISON:  CT scan 03/02/2022 FINDINGS: Lower chest: The lung bases are clear of acute process. No pleural effusion or pulmonary lesions. The heart is normal in size. No pericardial effusion. The distal esophagus and aorta are unremarkable. Hepatobiliary: No hepatic lesions or intrahepatic biliary dilatation. The gallbladder is surgically absent. No common bile duct dilatation. Pancreas: No mass, inflammation or ductal dilatation. Spleen: Very irregular nodular appearing spleen appears stable. Could be related to prior trauma. No lesions. Adrenals/Urinary Tract: The adrenal glands  are normal. No renal, ureteral or bladder calculi. No worrisome renal or bladder lesions without contrast. Stomach/Bowel: The stomach, duodenum, small bowel and colon are unremarkable. No acute inflammatory process, mass lesions or obstructive findings. The appendix is surgically absent. Vascular/Lymphatic: The aorta is normal in caliber. No atheroscerlotic calcifications. No mesenteric of retroperitoneal mass or adenopathy. Small scattered lymph nodes are noted. Reproductive: The uterus is unremarkable. There is a new 6 x 5.7 x 5.6 cm cyst associated with the left ovary. It measures 20 Hounsfield units. The fact that it was not present in January of this year would suggest a benign process. Recommend follow-up pelvic ultrasound examination in 3-6 months. Note: This recommendation does not apply to premenarchal patients and to those with increased risk (genetic, family history, elevated tumor markers or other high-risk factors) of ovarian cancer. Reference: JACR 2020 Feb; 17(2):248-254 Other: No pelvic mass or adenopathy. No free pelvic fluid collections. No inguinal mass or adenopathy. No abdominal wall hernia or subcutaneous lesions. Musculoskeletal: No significant bony findings. IMPRESSION: 1. No acute abdominal/pelvic findings, mass lesions or adenopathy. 2. No renal, ureteral or bladder calculi or mass. 3. New 6 x 5.7 x 5.6 cm left ovarian cyst. Recommend follow-up pelvic ultrasound examination in 3-6 months. 4. Status post cholecystectomy. No biliary dilatation. Electronically Signed   By: Marijo Sanes M.D.   On: 04/12/2022 14:55       Garald Balding, PA-C 04/24/22 0912    Tegeler, Gwenyth Allegra, MD 04/24/22 1001

## 2022-04-24 NOTE — Consult Note (Addendum)
Cardiology Consultation   Patient ID: Krystal [name] MRN: [id number]; DOB: 07/16/1990  Admit date: 04/23/2022 Date of Consult: 04/24/2022  PCP:  Patient, No Pcp Per   [facility] Cardiologist:  Freada Bergeron, MD      Patient Profile:   Krystal [name] is a 32 y.o. 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 Cain Sieve.  History of Present Illness:   [name] is a 32 year old female with above medical history. Patient follows with Dr. Johney Frame in our cardio-obstetrics division and sees the HTN clinic for BP management. She was seen by Dr. Johney Frame on 05/31/21, at which time she complained of frequent nausea, vomiting, shortness of breath. Echocardiogram on 06/17/21 Showed EF 60-65%, no regional wall motion abnormalities, mild LVH, normal RV systolic function, mild mitral valve regurgitation. There was also mention of intermittent chest tightness, EKG performed 06/27/21 showed no acute changes. Patient has several medication intolerances with nausea/vomiting at higher doses of labetalol and jitteriness with nicardipine.   In 06/2021, patient had a fetal ultrasound that demonstrated the possibility of Tetralogy of Fallot. Patient was referred to pediatric cardiology at [facility]. She was noted to have elevated BP to the 160s/100s, and it was recommended that patient be admitted to the hospital for pre-eclampsia. Upon arrival, patient did complain of severe headache that had been ongoing for a few days, blurred vision, dizziness. Noted to have anisicoria. CT/MRI were negative for acute findings. Neurology was consulted and suspected that her symptoms were due to atypical migraine with aura. She was treated with labetalol 400 mg TID, amlodipine 10 mg daily,  hydralazine 100 mg TID.   Patient underwent emergent c-section with bilateral salpingectomy on 07/18/21. She was seen by Dr. Johney Frame on 09/19/21, at which time patient overall was not feeling well. BP had been elevated to to 200s/130s. Her labetalol was transitioned to carvedilol, and she was started on spironolactone. Patient planned to see PharmD, but missed appointments.   Most recently, patient was seen by Dr. Johney Frame on 03/05/22. At that time, patient reported having BP readings over 220s/130s at home. She had been vomiting up a lot of her medications. Also, complained of vision loss in her right eye and double vision in her left eye. She had gone to the ED on 1/7 for evaluation, but CT head showed no acute findings. She was admitted to the hospital on 1/10 and was discharged on clonidine patch, losartan, amlodipine, spironolactone.  She was also noted to have intractable nausea and vomiting, retinal vein occlusion of right eye, interstitial cystitis. She had a CT of the abdomen/pelvis in 02/2022 that showed no adrenal adenomas and no evidence of renal artery stenosis.   Patient was seen in the ED on 2/21 for left lower quadrant tenderness and suprapubic pain. Found to have a 6 cm left ovarian cyst with no torsion. Underwent laparoscopic left ovarian cystectomy on 2/27. She presented to the ED on 2/28 complaining of lower abdominal and back pain that worsened that AM. She also was having difficulty urinating. Fond to have a pulse rate of 38 in the lobby. Labs in the ED showed Na 139, K 3.8, creatinine 1.03, WBC 9.2, hemoglobin 12.4, platelets 262. EKG showed sinus bradycardia with HR 47 BPM. TSH elevated to 9.236. Free T4 0.71.   On interview,  patient reports that she has severe, sharp abdominal pain.  This pain has been present since her surgery.  The only thing that has helped her pain is morphine.  Denies having chest pain.  Every now and then will feel fluttering in her chest.  Does feel somewhat  lightheaded, but has not stood up so is unsure if she is dizzy.  Denies syncope, near syncope.  Prior to her surgery, patient reports having severe fatigue for about 3 weeks.  Has some shortness of breath because it hurts her abdomen to take a deep breath.  Past Medical History:  Diagnosis Date   Acute acalculous cholecystitis s/p lap cholecystectomy 05/11/2020 05/11/2020   Angio-edema 01/15/2021   Anxiety    Asthma    Depression    HELLP (hemolytic anemia/elev liver enzymes/low platelets in pregnancy) 12/08/2017   History of borderline personality disorder    History of HELLP syndrome, currently pregnant 04/21/2019   Formatting of this note might be different from the original.  History of HELLP syndrome at 26 weeks 5 days (12/07/2017); admitted to [facility] with BP 200/120s, Plt 52, AST/ALT 471/375  APS evaluation negative (04/2019)   Daily ASA 81 mg   Challenging blood pressure control in current pregnancy      Plan:  - Baseline pre-eclampsia labs this pregnancy within normal limits (Cr: 0.71; urine p:c 0.106)  -    IC (interstitial cystitis)    Kidney stones    Migraines    Necrotizing fasciitis ([place])    left hand amputation   PTSD (post-traumatic stress disorder)    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   Severe uncontrolled hypertension 10/08/2017   UTI (urinary tract infection)     Past Surgical History:  Procedure Laterality Date   APPENDECTOMY     BIOPSY  05/10/2020   Procedure: BIOPSY;  Surgeon: Arta Silence, MD;  Location: [facility];  Service: Endoscopy;;   CESAREAN SECTION N/A 12/06/2017   Procedure: CESAREAN SECTION;  Surgeon: Sloan Leiter, MD;  Location: [facility];  Service: Obstetrics;  Laterality: N/A;   CESAREAN SECTION  07/21/2021   Procedure: CESAREAN SECTION;  Surgeon: Donnamae Jude, MD;  Location: [facility];  Service: Obstetrics;;   CHOLECYSTECTOMY N/A 05/11/2020   Procedure: SINGLE SITE LAPAROSCOPIC CHOLECYSTECTOMY AND LIVER BIOSY;  Surgeon: Michael Boston,  MD;  Location: [facility];  Service: General;  Laterality: N/A;   ESOPHAGOGASTRODUODENOSCOPY (EGD) WITH PROPOFOL N/A 05/10/2020   Procedure: ESOPHAGOGASTRODUODENOSCOPY (EGD) WITH PROPOFOL;  Surgeon: Arta Silence, MD;  Location: [facility];  Service: Endoscopy;  Laterality: N/A;   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 Authorizing Provider  acetaminophen (TYLENOL) 500 MG tablet Take 1,000 mg by mouth every 6 (six) hours as needed for moderate pain or headache.    [provider]  albuterol (VENTOLIN HFA) 108 (90 Base) MCG/ACT inhaler INHALE 2 PUFFS INTO THE LUNGS EVERY 4 HOURS AS NEEDED FOR WHEEZING OR SHORTNESS  OR BREATH 04/14/22   Garnet Sierras, DO  amLODipine (NORVASC) 10 MG tablet Take 1 tablet (10 mg total) by mouth daily. 05/31/21   Freada Bergeron, MD  cetirizine (ZYRTEC ALLERGY) 10 MG tablet Take 1 tablet (10 mg total) by mouth 2 (two) times daily. Patient taking differently: Take 10 mg by mouth See admin instructions. Take 10 mg daily, may take a second 10 mg dose as needed for allergies 03/26/21   Garnet Sierras, DO  chlorthalidone (HYGROTON) 25 MG tablet Take 1 tablet (25 mg total) by mouth daily. 04/09/22   Skeet Latch, MD  clonazePAM (KLONOPIN) 1 MG tablet Take 1 tablet (1 mg total) by mouth 3 (three) times daily as needed for anxiety (anxiety). 04/08/22 04/08/23  Vista Mink, MD  cloNIDine (CATAPRES - DOSED IN MG/24 HR) 0.3 mg/24hr  patch Place 1 patch (0.3 mg total) onto the skin once a week. 03/20/22   Mariel Aloe, MD  diphenhydrAMINE (BENADRYL) 25 MG tablet Take 25 mg by mouth daily as needed for allergies.    [provider]  EPINEPHrine 0.3 mg/0.3 mL IJ SOAJ injection Inject 0.3 mg into the muscle as needed for anaphylaxis. 01/15/21   Garnet Sierras, DO  famotidine (PEPCID) 20 MG tablet Take 1 tablet (20 mg total) by mouth 2 (two) times daily. 12/04/21   Garnet Sierras, DO  FLUoxetine (PROZAC) 10 MG capsule Take 1 capsule (10 mg total) by mouth daily for 7 days, THEN 2 capsules (20 mg total) daily for 7 days, THEN 3 capsules (30 mg total) daily for 7 days, THEN 4 capsules (40 mg total) daily for 20 days. 04/08/22 05/19/22  Vista Mink, MD  gabapentin (NEURONTIN) 300 MG capsule Take 1 capsule (300 mg total) by mouth 2 (two) times daily. 03/17/22 04/18/22  Mariel Aloe, MD  hydrOXYzine (ATARAX) 25 MG tablet Take 1 tablet (25 mg total) by mouth every 6 (six) hours. Patient not taking: Reported on 04/15/2022 04/13/22   Tretha Sciara, MD  levothyroxine (SYNTHROID) 125 MCG tablet Take 1 tablet (125 mcg total) by mouth daily before breakfast. 05/24/21   Philemon Kingdom, MD  lurasidone (LATUDA) 20 MG TABS tablet Take 1 tablet (20 mg total) by mouth daily. Take with food (dinner) 04/08/22   Vista Mink, MD  ondansetron (ZOFRAN-ODT) 4 MG disintegrating tablet Take 1 tablet (4 mg total) by mouth every 8 (eight) hours as needed for nausea or vomiting. Patient not taking: Reported on 04/15/2022 03/02/22   Rex Kras, PA  opium-belladonna (B&O SUPPRETTES) 16.2-60 MG suppository Place 1 suppository rectally as needed for bladder spasms. Patient not taking: Reported on 04/15/2022 04/12/22   Suella Broad A, PA-C  OVER THE COUNTER MEDICATION Take 1 Dose by mouth daily. Mushroom complex powder, 1 dose = 1 teaspoon    [provider]  Oxycodone HCl 10 MG TABS Take 1 tablet (10 mg total) by mouth every 6 (six) hours as needed.  Post op pain 04/22/22   Aletha Halim, MD  Prenatal Multivit-Min-Fe-FA (PRE-NATAL PO) Take 1 tablet by mouth daily.    [provider]  promethazine (PHENERGAN) 25 MG suppository Place 1 suppository (25 mg total) rectally every 6 (six) hours as needed for nausea or vomiting. 04/16/22   Drenda Freeze, MD  propranolol (INDERAL) 20 MG tablet Take 1 tablet (20 mg total) by mouth 2 (two) times daily as needed (for anxiety). 04/08/22   Vista Mink, MD  sodium chloride (OCEAN) 0.65 %  SOLN nasal spray Place 1 spray into both nostrils as needed for congestion.    [provider]  spironolactone (ALDACTONE) 25 MG tablet Take 1 tablet (25 mg total) by mouth daily. Patient not taking: Reported on 04/15/2022 09/19/21   Freada Bergeron, MD  SYMBICORT 80-4.5 MCG/ACT inhaler Inhale 2 puffs into the lungs 2 (two) times daily as needed (shortness of breath).    [provider]  valsartan (DIOVAN) 320 MG tablet Take 1 tablet (320 mg total) by mouth daily. 04/09/22   Skeet Latch, MD  venlafaxine XR (EFFEXOR XR) 37.5 MG 24 hr capsule Take 1 capsule (37.5 mg total) by mouth daily. Take with 75 mg for a total of 112.5 mg for 7 days before decreasing to 75 mg daily for 7 days and then decrease to 37.5 mg for 7 days before discontinuing 04/08/22 04/08/23  Vista Mink, MD  venlafaxine XR (EFFEXOR-XR) 75 MG 24 hr capsule Take 75 mg by mouth daily with breakfast. Take with 75 mg for a total of 112.5 mg for 7 days before decreasing to 75 mg daily for 7 days and then decrease to 37.5 mg for 7 days before discontinuing    [provider]    Inpatient Medications: Scheduled Meds:  acetaminophen  1,000 mg Oral TID   enoxaparin (LOVENOX) injection  50 mg Subcutaneous Q24H   Continuous Infusions:  lactated ringers 1,000 mL (04/24/22 1128)   PRN Meds: HYDROmorphone (DILAUDID) injection, ondansetron **OR** ondansetron (ZOFRAN) IV, oxyCODONE  Allergies:    Allergies   Allergen Reactions   Coreg [Carvedilol] Shortness Of Breath and Other (See Comments)    Triggers asthma   Other Shortness Of Breath and Other (See Comments)    FANSIDAR FOR MALARIA-ASTHMA   Red Dye Other (See Comments)    ALL RED DYES CAUSE PROBLEMS #40 IS WORST-MIGRAINES   Compazine [Prochlorperazine Maleate] Other (See Comments)    TWITCHING, CANT STAY STILL. Pt states she can tolerate promethazine   Vicodin [Hydrocodone-Acetaminophen] Itching, Nausea And Vomiting and Other (See Comments)    Pt states she can tolerate acetaminophen   Amitriptyline Hcl Other (See Comments)    "Angry moods"   Duloxetine Hcl Other (See Comments)    Passing out, irritable   Ibuprofen     heartburn   Lisinopril Cough   Naproxen Other (See Comments)    Heartburn    Labetalol Nausea And Vomiting   Metoclopramide Other (See Comments) and Swelling    Restlessness  Patient reports tolerating if given with Benadryl   Nifedipine Nausea And Vomiting and Other (See Comments)    Discussed with patient and maternal fetal medicine, patient has intolerance but not frank allergy.    Social History:   Social History   Socioeconomic History   Marital status: Married    Spouse name: Not on file   Number of children: 3   Years of education: Not on file   Highest education level: 8th grade  Occupational History   Not on file  Tobacco Use   Smoking status: Never   Smokeless tobacco: Never   Tobacco comments:    She tried a cigarette and never smoked again  Vaping Use   Vaping Use: Never used  Substance and Sexual Activity   Alcohol use: Yes    Comment: bottle of wine a week-occ   Drug use: No   Sexual activity: Not Currently    Birth control/protection: Surgical  Other Topics Concern   Not on file  Social History  Narrative   Not on file   Social Determinants of Health   Financial Resource Strain: Not on file  Food Insecurity: No Food Insecurity (04/24/2022)   Hunger Vital Sign    Worried  About Running Out of Food in the Last Year: Never true    Ran Out of Food in the Last Year: Never true  Recent Concern: Food Insecurity - Food Insecurity Present (03/12/2022)   Hunger Vital Sign    Worried About Running Out of Food in the Last Year: Often true    Ran Out of Food in the Last Year: Often true  Transportation Needs: No Transportation Needs (04/24/2022)   PRAPARE - Hydrologist (Medical): No    Lack of Transportation (Non-Medical): No  Recent Concern: Transportation Needs - Unmet Transportation Needs (03/12/2022)   PRAPARE - Transportation    Lack of Transportation (Medical): Yes    Lack of Transportation (Non-Medical): Yes  Physical Activity: Inactive (04/09/2022)   Exercise Vital Sign    Days of Exercise per Week: 0 days    Minutes of Exercise per Session: 0 min  Stress: Not on file  Social Connections: Not on file  Intimate Partner Violence: Not At Risk (04/24/2022)   Humiliation, Afraid, Rape, and Kick questionnaire    Fear of Current or Ex-Partner: No    Emotionally Abused: No    Physically Abused: No    Sexually Abused: No    Family History:    Family History  Problem Relation Age of Onset   Hypertension Mother    Coronary artery disease Mother    Hypertension Father    Stroke Father    Atrial fibrillation Father    Heart failure Maternal Grandfather    Heart attack Maternal Grandfather      ROS:  Please see the history of present illness.   All other ROS reviewed and negative.     Physical Exam/Data:   Vitals:   04/24/22 1230 04/24/22 1245 04/24/22 1300 04/24/22 1315  BP:      Pulse: (!) 49 (!) 33 (!) 40 (!) 33  Resp: 11 (!) 9 15 (!) 8  Temp:      TempSrc:      SpO2: 98% 98% 100% 98%  Weight:      Height:        Intake/Output Summary (Last 24 hours) at 04/24/2022 1332 Last data filed at 04/24/2022 0300 Gross per 24 hour  Intake --  Output 1100 ml  Net -1100 ml      04/23/2022    5:50 PM 04/22/2022    8:29 AM  04/15/2022   10:29 AM  Last 3 Weights  Weight (lbs) 238 lb 238 lb 238 lb 8 oz  Weight (kg) 107.956 kg 107.956 kg 108.183 kg     Body mass index is 33.19 kg/m.  General:  Young adult female, appears uncomfortable in the bed. Laying flat with head elevated  HEENT: normal. Lips are dry and cracked  Neck: no JVD Vascular: Right radial pulse 2+  Cardiac:  normal S1, S2. Bradycardic. No murmurs  Lungs:  Anterior lung exam clear to auscultation. Normal WOB on room air  Abd: soft, nontender, no hepatomegaly  Ext: no edema Musculoskeletal:  s/p left hand amputation  Skin: warm and dry  Neuro:  CNs 2-12 intact, no focal abnormalities noted Psych:  Normal affect   EKG:  The EKG was personally reviewed and demonstrates:  sinus bradycardia with HR 34 BPM Telemetry:  Telemetry  was personally reviewed and demonstrates:  Sinus bradycardia. HR predominantly in the 30s with occasional increases to the 50s   Relevant CV Studies:   Laboratory Data:  High Sensitivity Troponin:  No results for input(s): "TROPONINIHS" in the last 720 hours.   Chemistry Recent Labs  Lab 04/24/22 0215  NA 139  K 3.8  CL 103  CO2 27  GLUCOSE 118*  BUN 16  CREATININE 1.03*  CALCIUM 9.3  GFRNONAA >60  ANIONGAP 9    Recent Labs  Lab 04/24/22 0215  PROT 6.7  ALBUMIN 4.1  AST 19  ALT 21  ALKPHOS 41  BILITOT 0.7   Lipids No results for input(s): "CHOL", "TRIG", "HDL", "LABVLDL", "LDLCALC", "CHOLHDL" in the last 168 hours.  Hematology Recent Labs  Lab 04/24/22 0215  WBC 9.2  RBC 3.81*  HGB 12.4  HCT 36.1  MCV 94.8  MCH 32.5  MCHC 34.3  RDW 12.4  PLT 262   Thyroid  Recent Labs  Lab 04/24/22 1201  TSH 9.236*  FREET4 0.71    BNPNo results for input(s): "BNP", "PROBNP" in the last 168 hours.  DDimer No results for input(s): "DDIMER" in the last 168 hours.   Radiology/Studies:  CT ABDOMEN PELVIS W CONTRAST  Result Date: 04/24/2022 CLINICAL DATA:  Postop abdominal pain. Procedure to remove  cyst on left ovary yesterday. Difficulty urinating. EXAM: CT ABDOMEN AND PELVIS WITH CONTRAST TECHNIQUE: Multidetector CT imaging of the abdomen and pelvis was performed using the standard protocol following bolus administration of intravenous contrast. RADIATION DOSE REDUCTION: This exam was performed according to the departmental dose-optimization program which includes automated exposure control, adjustment of the mA and/or kV according to patient size and/or use of iterative reconstruction technique. CONTRAST:  134m OMNIPAQUE IOHEXOL 350 MG/ML SOLN COMPARISON:  04/12/2022. FINDINGS: Lower chest: Mild emphysematous changes are present in the lungs. There is atelectasis at the lung bases. Hepatobiliary: No focal liver abnormality is seen. Mild fatty infiltration of the liver is noted. Status post cholecystectomy. No biliary dilatation. Pancreas: Unremarkable. No pancreatic ductal dilatation or surrounding inflammatory changes. Spleen: Normal in size without focal abnormality. Adrenals/Urinary Tract: The adrenal glands are within normal limits. The kidneys enhance symmetrically. No renal calculus or hydronephrosis. A Foley catheter is present in the urinary bladder in the bladder is decompressed. Stomach/Bowel: Stomach is within normal limits. Appendix is not seen. No evidence of bowel wall thickening, distention, or inflammatory changes. Vascular/Lymphatic: No significant vascular findings are present. No enlarged abdominal or pelvic lymph nodes. Reproductive: The uterus is in-situ.  No adnexal mass. Other: A small amount of free fluid is present in the cul-de-sac. Trace amount of free air is noted in the upper abdomen and at the umbilicus in low anterior abdominal wall subcutaneous tissues, likely related to recent surgery. No abscess or hematoma is seen. Musculoskeletal: No acute osseous abnormality. IMPRESSION: 1. Foci of free air in the upper abdomen, umbilicus, and low anterior abdominal wall, likely related  to history of recent surgery. No hematoma or abscess is seen. 2. Trace amount of free fluid in the cul-de-sac. 3. Hepatic steatosis. 4. Foley catheter in the urinary bladder. Electronically Signed   By: LBrett FairyM.D.   On: 04/24/2022 04:37     Assessment and Plan:   Bradycardia  - Patient's HR has gotten as low as 34 BPM this admission  - EKG shows sinus bradycardia with HR 34 BPM - Per telemetry, HR is predominantly in the 30s with occasional increases to the 50s. Patient  does have a 3 week history of fatigue. Currently feels lightheaded  - Hold propranolol, clonidine. 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 Primary team adjusting levothyroxine  - Continue to monitor on telemetry   Resistant HTN  - For now, hold home BP medications while HR is in the 30s. When able, plan to resume amlodipine, chlorthalidone, spironolactone, and valsartan  - Ordered PRN hydralazine for SBP >160  Otherwise per primary  - Hashimoto Thyroiditis  - Abdominal pain, flank pain, interstitial nephritis  - Anxiety, depression, borderline personality disorder   Risk Assessment/Risk Scores:     For questions or updates, please contact [place] Please consult [email or web address] for contact info under    Signed, Margie Billet, PA-C  04/24/2022 1:32 PM  Patient seen and examined and agree with Vikki Ports, PA-C as detailed above.  In brief, the patient is a 32 y.o. 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  Patient with known  resistant hypertension and history of HELLP syndrome. She has required frequent adjustments to her antihypertensives as outpatient both due to med intolerances as well as severe episodes of HTN requiring frequent readmissions. Also with a lot of anxiety, stress and PTSD with frequent episodes of vomiting making it difficult to tolerate PO meds. She was recently started on clonidine patch at HTN clinic.  She presents on this admission following recent laparoscopic left ovarian cystectomy with worsening abdominal pain. CT abdomen with post-surgical changes but no acute pathology. Notably was retaining urine and symptoms improved with placement of a foley catheter. While abdominal pain and urinary retention can certainly lead to increased vagal tone, her sinus bradycardia has persisted suggesting this is likely driven mainly be a medication effect in the setting of clonidine and propranolol use. Will hold all nodal meds and monitor. Fortunately, she is sinus brady with no evidence of high degree AVB.  GEN: Mildly uncomfortable appearing  Neck: No JVD Cardiac: Bradycardic, regular, no murmurs  Respiratory: Clear to auscultation bilaterally. GI: Soft, tender to palpation MS: No edema; warm Neuro:  Nonfocal  Psych: Normal affect    Plan: -Hold nodal agents including propranolol and clonidine patch (removed at bedside) -Hold antihypertensive meds for now given degree of bradycardia and SBP not severely elevated; will add back as HR improves -Has prn hydralazine ordered as patient has severe hypertension at baseline and may need intermittent dosing -Management of hypothyroidism per primary -Post op care per primary  Gwyndolyn Kaufman, MD

## 2022-04-24 NOTE — H&P (Signed)
Date: 04/24/2022               Patient Name:  Krystal [name] MRN: [id number]  DOB: 1990-06-09 Age / Sex: 32 y.o., female   PCP: Patient, No Pcp Per         Medical Service: Internal Medicine Teaching Service         Attending Physician: Dr. Lottie Mussel, MD    First Contact: Dr. Graylon Gunning, MD Pager: [phone]  Second Contact: Dr. Virl Axe, MD Pager: [phone]       After Hours (After 5p/  First Contact Pager: [phone]  weekends / holidays): Second Contact Pager: [phone]   Chief Complaint: Flank/abd pain, N/V  History of Present Illness: Krystal [name] is a 32 year old with PMH of HTN, interstitial nephritis, anxiety/depression, borderline personality disorder, preeclampsia, HELLP syndrome, asthma, migraines and Hashimoto thyroiditis who presented to the ED for evaluation of flank pain with associated nausea and vomiting after a laparoscopic ovarian cystectomy on 2/27.  Patient states she has some pain around her abdomen after the surgery she developed significant nausea during the day and reports nonbilious nonbloody emesis x 6. She tried to take her Oxy but will vomit any liquid, medication or food that she ate. She was able to keep down oxycodone 10 mg in the afternoon but this is not improved for pain. She reports having severe mid back pain mostly located in her bilateral flank, L>R as well as intermittent lower abdominal pain described as sharp. She has not been able to void since 6:30 AM on 2/28 and reports feeling fullness in her bladder. She endorses some mild dizziness but denies any fevers, chills, chest pain, shortness of breath, hematuria, bloody stools or vision changes. Patient called her OB/GYN and was informed to come to the ER for further evaluation.   ED course: Vitals show SBP in the 140s, HR 30s-50s, afebrile, RR 13-20 and SpO2 98-100% on room air.  Labs show WBC 9.2, Hgb 12.4, creatinine 1.03, K+ 3.8, lipase 26, lactic acid 1.6, hCG and UA neg. CT  A/P shows some free air in the abdomen likely related to recent surgery, no hematoma or abscess as well as trace amount of free fluid in the cul-de-sac.  Patient given morphine 4 mg x 2, Dilaudid 1 mg x 1 and Percocet 5-325 mg x 1 without significant improvement in her pain.  Cardiology and OB/GYN consulted for evaluation.  IMTS consulted for admission.  Meds:  No outpatient medications have been marked as taking for the 04/23/22 encounter [facility] Encounter).    Allergies: Allergies as of 04/23/2022 - Review Complete 04/23/2022  Allergen Reaction Noted   Coreg [carvedilol] Shortness Of Breath and Other (See Comments) 03/06/2022   Other Shortness Of Breath and Other (See Comments) 11/06/2015   Red dye Other (See Comments) 11/06/2015   Compazine [prochlorperazine maleate] Other (See Comments) 11/06/2015   Vicodin [hydrocodone-acetaminophen] Itching, Nausea And Vomiting, and Other (See Comments) 11/06/2015   Amitriptyline hcl Other (See Comments) 05/08/2020   Duloxetine hcl Other (See Comments) 05/08/2020   Ibuprofen  04/18/2022   Lisinopril Cough 08/10/2021   Naproxen Other (See Comments) 03/06/2022   Labetalol Nausea And Vomiting 06/22/2019   Metoclopramide Other (See Comments) and Swelling 07/24/2017   Nifedipine Nausea And Vomiting and Other (See Comments) 09/07/2018   Past Medical History:  Diagnosis Date   Acute acalculous cholecystitis s/p lap cholecystectomy 05/11/2020 05/11/2020   Angio-edema 01/15/2021   Anxiety    Asthma  Depression    HELLP (hemolytic anemia/elev liver enzymes/low platelets in pregnancy) 12/08/2017   History of borderline personality disorder    History of HELLP syndrome, currently pregnant 04/21/2019   Formatting of this note might be different from the original.  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 Father with a history of A-fib and passed away last year after having 6 strokes.  Her youngest daughter was diagnosed with tetralogy of Fallot last year.  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 She has 3 daughters and recently gave birth to her last daughter via C-section in May 2023. Patient was diagnosed with HELLP syndrome during her last pregnancy. Currently lives at home with her husband and her children. Drinks EtOH occasionally. Denies tobacco use. Uses delta-8 THC 1-2 times a week.   She currently has no PCP since her previous provider stopped accepting medicaid. She lost her Medicaid coverage and is currently in the process of trying to get approved for Medicaid again. While in [place], she had a necrotizing fasciitis infection in her left wrist in 2011 requiring amputation of the hand. She also had reconstruction of her right wrist in the same year due to IV phenergan infiltration during treatment for malaria and typhoid also [place].  Review of Systems: A complete ROS was negative except as per HPI.   Physical Exam: Blood pressure (!) 157/89, pulse (!) 54, temperature 97.7 F (36.5 C), temperature source Oral, resp. rate 11, height '5\' 11"'$  (1.803 m), weight 108 kg, last menstrual period 04/13/2022, SpO2 94 %, not currently breastfeeding.  General: Pleasant, well-appearing young woman laying in bed. No acute distress. Head: Normocephalic. Atraumatic. CV: Bradycardic.   Regular rhythm. No murmurs, rubs, or gallops. No LE edema Pulmonary: Lungs CTAB. Normal effort. No wheezing or rales. Abdominal: Soft. Moderate tenderness to palpation of the lower abdomen. Surgical incisions c/d/I. Bilateral CVA tenderness, L>R. Hypoactive bowel sounds. Extremities: Palpable radial and DP pulses.  Normal ROM. MSK: Amputated left hand. Right wrist reconstructed w/ healed surgical scar. NVI Skin: Warm and dry. No obvious rash or lesions. Neuro: A&Ox3. Moves all extremities. Normal sensation. No focal deficit. Psych: Normal mood and affect  EKG: personally reviewed my interpretation is sinus bradycardia   CT ABDOMEN PELVIS W CONTRAST IMPRESSION: 1. Foci of free air in the upper abdomen, umbilicus, and low anterior abdominal wall, likely related to history of recent surgery. No hematoma or abscess is seen. 2. Trace amount of free fluid in the cul-de-sac. 3. Hepatic steatosis. 4. Foley catheter in the urinary bladder.   Assessment & Plan by Problem: Principal Problem:   Bradycardia  Krystal [name] is a 32 year old with PMH of HTN, interstitial nephritis, anxiety/depression, borderline personality disorder, preeclampsia, HELLP syndrome, asthma, migraines and Hashimoto thyroiditis who presented to the ED  for evaluation of flank pain with associated nausea and vomiting after a laparoscopic ovarian cystectomy on 2/27  #Bradycardia Patient with a history of Hashimoto thyroiditis presented for evaluation of abdominal/flank pain after recent pelvic surgery and found to have bradycardia with HR in the 30s to 40s on admission.  EKG on admission shows sinus bradycardia with LBBB. Patient denies any history of bradycardia. She reports having nausea and vomiting with associated dizziness over the last 2 days. Denies any chest pain, vision changes or palpitations. Currently on amlodipine 10 mg daily and propranolol 20 mg BID needed for anxiety. Patient found to have elevated TSH to 9.2  and borderline low free T4 of 0.71. Bradycardia likely combination of nodal blockade and hypothyroidism.  -Cardiology consulted, appreciate recs -Hold amlodipine and propranolol -Increase levothyroxine dose to 150 mcg -Telemetry  #Abd pain #Flank pain #Interstitial nephritis Patient with a history of left ovarian cyst and interstitial nephritis presented to the ER for lower abdominal pain and flank pain with associated nausea and vomiting 1 day after a laparoscopic left ovarian cystectomy and lysis of adhesions. Patient also found to have urinary retention with bladder scan of 570 cc and output of 1000 cc via Foley catheter. Patient with no significant improvement in pain after Foley placement. CT A/P shows some postsurgical changes but no intra-abdominal abscess or hematoma. UA negative for infection. hCG and lipase neg. She is afebrile with no leukocytosis. Patient's presentation likely secondary to worsening of her interstitial nephritis in the setting of urinary retention from recent anesthesia exposure. Will continue to manage pain and continue Foley catheter for bladder decompression.  -OB/GYN consulted by ED doc, pending GI evaluation -Continue Foley, attempt trial of void tomorrow -Tylenol 1000 mg 3 times daily -Oxycodone 5 mg q6h prn for moderate, severe pain -IV Dilaudid 1 mg q4h prn for breakthrough pain -Resume home gabapentin 300 mg twice daily -F/u blood culture and urine culture, trend fever curve  #Resistant Hypertension Patient reports history of uncontrolled hypertension as well as preeclampsia and HELLP syndrome.  Home regimen includes amlodipine 10 mg daily, chlorthalidone 25 mg daily, valsartan 320 mg daily and clonidine 0.3 mg patch. States her baseline SBP is in the 140s she started getting dizzy when her SBP drops below the 130s.  SBP remains in the 120s to 140s. -Hold antihypertensive meds for today -IV hydralazine 10 mg as needed for SBP >160  #Hashimoto  thyroiditis Patient reports history of hypothyroidism from Hashimoto's thyroiditis. Currently on Synthroid 125 mcg daily. TSH on admission elevated to 9.2 with borderline low free T4 of 0.71. Patient borderline low hypothyroidism likely contributing to her bradycardia. -Increase Synthroid to 150 mcg daily -Repeat TSH in 6 weeks  #Anxiety/depression #Borderline personality disorder Patient reports he is currently being tapered off venlafaxine with uptitrating of her Prozac. -Venlafaxine 75 mg daily -Prozac 20 mg daily -Latuda 20 mg daily -Clonazepam 1 mg TID PRN for anxiety  #Asthma Respiratory status stable. No wheezing or rales on exam. Home regimen includes Symbicort and PRN Ventolin. -Resume as needed albuterol inhaler -Dulera 2 puffs twice daily  CODE STATUS: Full code DIET: Advance as tolerated PPx: Lovenox  Dispo: Admit patient to Observation with expected length of stay less than 2 midnights.  Signed: Lacinda Axon, MD 04/24/2022, 11:01 AM  Pager: [phone] Internal Medicine Teaching Service After 5pm on weekdays and 1pm on weekends: On Call pager: [phone]

## 2022-04-24 NOTE — ED Notes (Signed)
ED TO INPATIENT HANDOFF REPORT  ED Nurse Name and Phone #: [phone]  S Name/Age/Gender Krystal [name] 32 y.o. female Room/Bed: 043C/043C  Code Status   Code Status: Full Code  Home/SNF/Other Home Patient oriented to: self, place, time, and situation Is this baseline? Yes   Triage Complete: Triage complete  Chief Complaint Bradycardia [R00.1]  Triage Note Pt c/o lower abdominal and back pain worsened this morning. Pt states she has had this pain for approx 3 weeks but had procedure to remove cyst on left ovary yeserday. Pt states she has been having difficulty urinating today also.    Allergies Allergies  Allergen Reactions   Coreg [Carvedilol] Shortness Of Breath and Other (See Comments)    Triggers asthma   Other Shortness Of Breath and Other (See Comments)    FANSIDAR FOR MALARIA-ASTHMA   Red Dye Other (See Comments)    ALL RED DYES CAUSE PROBLEMS #40 IS WORST-MIGRAINES   Compazine [Prochlorperazine Maleate] Other (See Comments)    TWITCHING, CANT STAY STILL. Pt states she can tolerate promethazine   Vicodin [Hydrocodone-Acetaminophen] Itching, Nausea And Vomiting and Other (See Comments)    Pt states she can tolerate acetaminophen   Amitriptyline Hcl Other (See Comments)    "Angry moods"   Duloxetine Hcl Other (See Comments)    Passing out, irritable   Ibuprofen     heartburn   Lisinopril Cough   Naproxen Other (See Comments)    Heartburn    Labetalol Nausea And Vomiting   Metoclopramide Other (See Comments) and Swelling    Restlessness  Patient reports tolerating if given with Benadryl   Nifedipine Nausea And Vomiting and Other (See Comments)    Discussed with patient and maternal fetal medicine, patient has intolerance but not frank allergy.    Level of Care/Admitting Diagnosis ED Disposition     ED Disposition  Admit   Condition  --   Comment  Hospital Area: [facility] [[id number]]  Level of Care: Telemetry Medical [104]  May  place patient in observation at [facility] or [facility] if equivalent level of care is available:: No  Covid Evaluation: Asymptomatic - no recent exposure (last 10 days) testing not required  Diagnosis: Bradycardia [191850]  Admitting Physician: Lottie Mussel [id number]  Attending Physician: Lottie Mussel [id number]          B Medical/Surgery History Past Medical History:  Diagnosis Date   Acute acalculous cholecystitis s/p lap cholecystectomy 05/11/2020 05/11/2020   Angio-edema 01/15/2021   Anxiety    Asthma    Depression    HELLP (hemolytic anemia/elev liver enzymes/low platelets in pregnancy) 12/08/2017   History of borderline personality disorder    History of HELLP syndrome, currently pregnant 04/21/2019   Formatting of this note might be different from the original.  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   Severe uncontrolled hypertension 10/08/2017   UTI (urinary tract infection)    Past Surgical History:  Procedure Laterality Date   APPENDECTOMY     BIOPSY  05/10/2020   Procedure: BIOPSY;  Surgeon: Arta Silence, MD;  Location: [facility];  Service: Endoscopy;;   CESAREAN SECTION N/A 12/06/2017   Procedure: CESAREAN SECTION;  Surgeon: Sloan Leiter, MD;  Location: [facility];  Service: Obstetrics;  Laterality: N/A;   CESAREAN SECTION  07/21/2021   Procedure: CESAREAN SECTION;  Surgeon: Donnamae Jude, MD;  Location: [facility];  Service: Obstetrics;;   CHOLECYSTECTOMY N/A  05/11/2020   Procedure: SINGLE SITE LAPAROSCOPIC CHOLECYSTECTOMY AND LIVER BIOSY;  Surgeon: Michael Boston, MD;  Location: [facility];  Service: General;  Laterality: N/A;   ESOPHAGOGASTRODUODENOSCOPY (EGD) WITH PROPOFOL N/A 05/10/2020   Procedure: ESOPHAGOGASTRODUODENOSCOPY (EGD) WITH PROPOFOL;  Surgeon: Arta Silence, MD;  Location: [facility];  Service: Endoscopy;  Laterality: N/A;   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   HAND RECONSTRUCTION Right    INCISION AND DRAINAGE     LAPAROSCOPIC LYSIS OF ADHESIONS  04/22/2022   Procedure: LAPAROSCOPIC LYSIS OF ADHESIONS;  Surgeon: Aletha Halim, MD;  Location: [facility];  Service: Gynecology;;   LAPAROSCOPIC OVARIAN CYSTECTOMY Left 04/22/2022   Procedure: LAPAROSCOPIC LEFT OVARIAN CYSTECTOMY;  Surgeon: Aletha Halim, MD;  Location: [facility];  Service: Gynecology;  Laterality: Left;   NASAL SEPTUM SURGERY       A IV Location/Drains/Wounds Patient Lines/Drains/Airways Status     Active Line/Drains/Airways     Name Placement date Placement time Site Days   Peripheral IV 04/24/22 20 G 2.5" Anterior;Right Forearm 04/24/22  0208  Forearm  less than 1   Urethral Catheter Double-lumen 16 Fr. 04/24/22  0324  Double-lumen  less than 1   Incision - 4 Ports Abdomen 1: Umbilicus 2: Lower;Mid 3: Right;Lateral 4: Left;Lateral 04/22/22  --  -- 2   Incision - 2 Ports Abdomen 1: Umbilicus 2: Right;Upper;Lateral 05/11/20  1906  -- 713            Intake/Output Last 24 hours  Intake/Output Summary (Last 24 hours) at 04/24/2022 1257 Last data filed at 04/24/2022 0300 Gross per 24 hour  Intake --  Output 1100 ml  Net -1100 ml    Labs/Imaging Results for orders placed or performed during the hospital encounter of 04/23/22 (from the past 48 hour(s))  Culture, blood (routine x 2)     Status: None (Preliminary result)   Collection Time: 04/24/22  2:13 AM   Specimen: BLOOD  Result Value Ref Range   Specimen Description BLOOD RIGHT ANTECUBITAL     Special Requests      BOTTLES DRAWN AEROBIC AND ANAEROBIC Blood Culture adequate volume   Culture      NO GROWTH < 12 HOURS Performed at [facility] [address] [place], [place] [address]    Report Status PENDING   CBC with Differential     Status: Abnormal   Collection Time: 04/24/22  2:15 AM  Result Value Ref Range   WBC 9.2 4.0 - 10.5 K/uL   RBC 3.81 (L) 3.87 - 5.11 MIL/uL   Hemoglobin 12.4 12.0 - 15.0 g/dL   HCT 36.1 36.0 - 46.0 %   MCV 94.8 80.0 - 100.0 fL   MCH 32.5 26.0 - 34.0 pg   MCHC 34.3 30.0 - 36.0 g/dL   RDW 12.4 11.5 - 15.5 %   Platelets 262 150 - 400 K/uL   nRBC 0.0 0.0 - 0.2 %   Neutrophils Relative % 69 %  Neutro Abs 6.3 1.7 - 7.7 K/uL   Lymphocytes Relative 25 %   Lymphs Abs 2.3 0.7 - 4.0 K/uL   Monocytes Relative 6 %   Monocytes Absolute 0.6 0.1 - 1.0 K/uL   Eosinophils Relative 0 %   Eosinophils Absolute 0.0 0.0 - 0.5 K/uL   Basophils Relative 0 %   Basophils Absolute 0.0 0.0 - 0.1 K/uL   Immature Granulocytes 0 %   Abs Immature Granulocytes 0.02 0.00 - 0.07 K/uL    Comment: Performed at [facility] [address] [place], [place] [address]  Comprehensive metabolic panel     Status: Abnormal   Collection Time: 04/24/22  2:15 AM  Result Value Ref Range   Sodium 139 135 - 145 mmol/L   Potassium 3.8 3.5 - 5.1 mmol/L   Chloride 103 98 - 111 mmol/L   CO2 27 22 - 32 mmol/L   Glucose, Bld 118 (H) 70 - 99 mg/dL    Comment: Glucose reference range applies only to samples taken after fasting for at least 8 hours.   BUN 16 6 - 20 mg/dL   Creatinine, Ser 1.03 (H) 0.44 - 1.00 mg/dL   Calcium 9.3 8.9 - 10.3 mg/dL   Total Protein 6.7 6.5 - 8.1 g/dL   Albumin 4.1 3.5 - 5.0 g/dL   AST 19 15 - 41 U/L   ALT 21 0 - 44 U/L   Alkaline Phosphatase 41 38 - 126 U/L   Total Bilirubin 0.7 0.3 - 1.2 mg/dL   GFR, Estimated >60 >60 mL/min    Comment: (NOTE) Calculated using the CKD-EPI Creatinine Equation (2021)    Anion gap 9 5 - 15    Comment:  Performed at [facility] [address], [place], [place] [address]  Lipase, blood     Status: None   Collection Time: 04/24/22  2:15 AM  Result Value Ref Range   Lipase 26 11 - 51 U/L    Comment: Performed at [facility] [address] [place], [place] [address]  Lactic acid, plasma     Status: None   Collection Time: 04/24/22  2:15 AM  Result Value Ref Range   Lactic Acid, Venous 1.6 0.5 - 1.9 mmol/L    Comment: Performed at [facility] [address] [place], [place] [address]  I-Stat beta hCG blood, ED     Status: None   Collection Time: 04/24/22  2:37 AM  Result Value Ref Range   I-stat hCG, quantitative <5.0 <5 mIU/mL   Comment 3            Comment:   GEST. AGE      CONC.  (mIU/mL)   <=1 WEEK        5 - 50     2 WEEKS       50 - 500     3 WEEKS       100 - 10,000     4 WEEKS     1,000 - 30,000        FEMALE AND NON-PREGNANT FEMALE:     LESS THAN 5 mIU/mL   Urinalysis, Routine w reflex microscopic -Urine, Clean Catch     Status: None   Collection Time: 04/24/22  3:23 AM  Result Value Ref Range   Color, Urine YELLOW YELLOW   APPearance CLEAR CLEAR   Specific Gravity, Urine 1.011 1.005 - 1.030   pH 6.0 5.0 - 8.0   Glucose,  UA NEGATIVE NEGATIVE mg/dL   Hgb urine dipstick NEGATIVE NEGATIVE   Bilirubin Urine NEGATIVE NEGATIVE   Ketones, ur NEGATIVE NEGATIVE mg/dL   Protein, ur NEGATIVE NEGATIVE mg/dL   Nitrite NEGATIVE NEGATIVE   Leukocytes,Ua NEGATIVE NEGATIVE    Comment: Performed at [facility] [address], [place], [place] [address]  T4, free     Status: None   Collection Time: 04/24/22 12:01 PM  Result Value Ref Range   Free T4 0.71 0.61 - 1.12 ng/dL    Comment: (NOTE) Biotin ingestion may interfere with free T4 tests. If the results are inconsistent with the TSH level, previous test results, or the clinical presentation, then consider biotin interference. If needed, order repeat testing after stopping biotin. Performed at [facility], [place] [address], [place], [place] [address]   Phosphorus     Status: Abnormal   Collection Time: 04/24/22 12:01 PM  Result Value Ref Range   Phosphorus 2.4 (L) 2.5 - 4.6 mg/dL    Comment: Performed at [facility] [address], [place], [place] [address]   CT ABDOMEN PELVIS W CONTRAST  Result Date: 04/24/2022 CLINICAL DATA:  Postop abdominal pain. Procedure to remove cyst on left ovary yesterday. Difficulty urinating. EXAM: CT ABDOMEN AND PELVIS WITH CONTRAST TECHNIQUE: Multidetector CT imaging of the abdomen and pelvis was performed using the standard protocol following bolus administration of intravenous contrast. RADIATION DOSE REDUCTION: This exam was performed according to the departmental dose-optimization program which includes automated exposure control, adjustment of the mA and/or kV according to patient size and/or use of iterative reconstruction technique. CONTRAST:  179m OMNIPAQUE IOHEXOL 350 MG/ML SOLN COMPARISON:  04/12/2022. FINDINGS: Lower chest: Mild emphysematous changes are present in the lungs. There is atelectasis at the lung bases. Hepatobiliary: No focal liver abnormality is seen. Mild fatty infiltration of the liver is noted. Status post cholecystectomy. No biliary dilatation. Pancreas: Unremarkable. No pancreatic ductal dilatation or surrounding inflammatory changes. Spleen: Normal in size without focal abnormality. Adrenals/Urinary Tract: The adrenal glands are within normal limits. The kidneys enhance symmetrically. No renal calculus or hydronephrosis. A Foley catheter is present in the urinary bladder in the bladder is decompressed. Stomach/Bowel: Stomach is within normal limits. Appendix is not seen. No evidence of bowel wall thickening, distention, or inflammatory changes. Vascular/Lymphatic: No significant vascular findings are present. No enlarged abdominal or pelvic lymph nodes. Reproductive: The uterus is in-situ.  No adnexal mass. Other: A small  amount of free fluid is present in the cul-de-sac. Trace amount of free air is noted in the upper abdomen and at the umbilicus in low anterior abdominal wall subcutaneous tissues, likely related to recent surgery. No abscess or hematoma is seen. Musculoskeletal: No acute osseous abnormality. IMPRESSION: 1. Foci of free air in the upper abdomen, umbilicus, and low anterior abdominal wall, likely related to history of recent surgery. No hematoma or abscess is seen. 2. Trace amount of free fluid in the cul-de-sac. 3. Hepatic steatosis. 4. Foley catheter in the urinary bladder. Electronically Signed   By: LBrett FairyM.D.   On: 04/24/2022 04:37    Pending Labs Unresulted Labs (From admission, onward)     Start     Ordered   04/25/22 0500  Magnesium  Tomorrow morning,   R       Question:  Specimen collection method  Answer:  IV Team=IV Team collect   04/24/22 1113   04/24/22 1112  TSH  Once,   R  Question:  Specimen collection method  Answer:  IV Team=IV Team collect   04/24/22 1113   04/24/22 1014  HIV Antibody (routine testing w rflx)  (HIV Antibody (Routine testing w reflex) panel)  Once,   R       Question:  Specimen collection method  Answer:  IV Team=IV Team collect   04/24/22 1015   04/24/22 0521  Urine Culture (for pregnant, neutropenic or urologic patients or patients with an indwelling urinary catheter)  (Urine Labs)  Once,   URGENT       Question:  Indication  Answer:  Dysuria   04/24/22 0520   04/23/22 2142  Lactic acid, plasma  Now then every 2 hours,   R (with STAT occurrences)      04/23/22 2142   04/23/22 2142  Culture, blood (routine x 2)  BLOOD CULTURE X 2,   R (with STAT occurrences)      04/23/22 2142            Vitals/Pain Today's Vitals   04/24/22 1130 04/24/22 1200 04/24/22 1215 04/24/22 1245  BP: 128/78     Pulse: (!) 34 (!) 37 (!) 44 (!) 33  Resp: '11 10 17 '$ (!) 9  Temp:      TempSrc:      SpO2: 96% 98% 98% 98%  Weight:      Height:      PainSc:         Isolation Precautions No active isolations  Medications Medications  enoxaparin (LOVENOX) injection 50 mg (50 mg Subcutaneous Given 04/24/22 1022)  ondansetron (ZOFRAN) tablet 4 mg ( Oral See Alternative 04/24/22 1037)    Or  ondansetron (ZOFRAN) injection 4 mg (4 mg Intravenous Given 04/24/22 1037)  oxyCODONE (Oxy IR/ROXICODONE) immediate release tablet 5 mg (5 mg Oral Given 04/24/22 1033)  acetaminophen (TYLENOL) tablet 1,000 mg (1,000 mg Oral Given 04/24/22 1038)  HYDROmorphone (DILAUDID) injection 1 mg (has no administration in time range)  lactated ringers infusion (1,000 mLs Intravenous New Bag/Given 04/24/22 1128)  oxyCODONE-acetaminophen (PERCOCET/ROXICET) 5-325 MG per tablet 1 tablet (1 tablet Oral Given 04/24/22 0118)  ondansetron (ZOFRAN) injection 4 mg (4 mg Intravenous Given 04/24/22 0308)  morphine (PF) 4 MG/ML injection 4 mg (4 mg Intravenous Given 04/24/22 0308)  iohexol (OMNIPAQUE) 350 MG/ML injection 100 mL (100 mLs Intravenous Contrast Given 04/24/22 0426)  morphine (PF) 4 MG/ML injection 4 mg (4 mg Intravenous Given 04/24/22 0532)  HYDROmorphone (DILAUDID) injection 1 mg (1 mg Intravenous Given 04/24/22 0829)    Mobility walks     Focused Assessments Cardiac Assessment Handoff:    No results found for: "CKTOTAL", "CKMB", "CKMBINDEX", "TROPONINI" No results found for: "DDIMER" Does the Patient currently have chest pain? No    R Recommendations: See Admitting Provider Note  Report given to:   Additional Notes:

## 2022-04-24 NOTE — ED Notes (Addendum)
Report called to [facility]  rn

## 2022-04-24 NOTE — ED Provider Notes (Signed)
[facility] Provider Note   CSN: [id number] Arrival date & time: 04/23/22  1737     History  Chief Complaint  Patient presents with   Post-op Problem    Krystal [name] is a 32 y.o. female with history of ovarian cystectomy laparoscopically performed yesterday by Dr. Ilda Basset for hemorrhagic left ovarian cyst.  She presents at this time with severe abdominal pain rating to the back which states it is worsened since her surgery.  Also states that she is history of interstitial cystitis and feels that she cannot urinate.  Patient tearful, states oxycodone at home not helping her symptoms.  Nauseated with vomiting secondary to pain.  Patient states that her abdominal pain today was preceding her recent surgery with gynecology, feels that is related to her interstitial cystitis, however also endorsing that she has been unable to urinate today, passing only tablespoons of urine at a time, awaiting appointment with interstitial cystitis specialist.  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  Addition to the above listed history patient is history of hypertension MDD, asthma, borderline personality disorder.  She is not anticoagulated.  Patient is on propranolol, has bradycardia in triage to the 30s new for the patient.  Hypothyroidism. She also follows with Dr. Blenda [facility] for [facility], recently admitted for HTN emergency.  HPI     Home Medications Prior to Admission medications   Medication Sig Start Date End Date Taking? Authorizing Provider  acetaminophen (TYLENOL) 500 MG tablet Take 1,000 mg by mouth every 6 (six) hours as needed for moderate pain or headache.    [provider]  albuterol (VENTOLIN HFA) 108 (90 Base) MCG/ACT  inhaler INHALE 2 PUFFS INTO THE LUNGS EVERY 4 HOURS AS NEEDED FOR WHEEZING OR SHORTNESS OR BREATH 04/14/22   Garnet Sierras, DO  amLODipine (NORVASC) 10 MG tablet Take 1 tablet (10 mg total) by mouth daily. 05/31/21   Freada Bergeron, MD  cetirizine (ZYRTEC ALLERGY) 10 MG tablet Take 1 tablet (10 mg total) by mouth 2 (two) times daily. Patient taking differently: Take 10 mg by mouth See admin instructions. Take 10 mg daily, may take a second 10 mg dose as needed for allergies 03/26/21   Garnet Sierras, DO  chlorthalidone (HYGROTON) 25 MG tablet Take 1 tablet (25 mg total) by mouth daily. 04/09/22   Skeet Latch, MD  clonazePAM (KLONOPIN) 1 MG tablet Take 1 tablet (1 mg total) by mouth 3 (three) times daily as needed for anxiety (anxiety). 04/08/22 04/08/23  Vista Mink, MD  cloNIDine (CATAPRES - DOSED IN MG/24 HR) 0.3 mg/24hr patch Place 1 patch (0.3 mg total) onto the skin once a week. 03/20/22   Mariel Aloe, MD  diphenhydrAMINE (BENADRYL) 25 MG tablet Take 25 mg by mouth daily as needed for allergies.    [provider]  EPINEPHrine 0.3 mg/0.3 mL IJ SOAJ injection Inject 0.3 mg into the muscle as needed for anaphylaxis. 01/15/21   Garnet Sierras, DO  famotidine (PEPCID) 20 MG tablet Take 1 tablet (20 mg total) by mouth 2 (two) times daily. 12/04/21   Garnet Sierras, DO  FLUoxetine (PROZAC) 10 MG capsule Take 1 capsule (10 mg total) by mouth daily for 7 days, THEN 2 capsules (20 mg total) daily for 7  days, THEN 3 capsules (30 mg total) daily for 7 days, THEN 4 capsules (40 mg total) daily for 20 days. 04/08/22 05/19/22  Vista Mink, MD  gabapentin (NEURONTIN) 300 MG capsule Take 1 capsule (300 mg total) by mouth 2 (two) times daily. 03/17/22 04/18/22  Mariel Aloe, MD  hydrOXYzine (ATARAX) 25 MG tablet Take 1 tablet (25 mg total) by mouth every 6 (six) hours. Patient not taking: Reported on 04/15/2022 04/13/22   Tretha Sciara, MD  levothyroxine (SYNTHROID) 125 MCG tablet Take 1 tablet (125  mcg total) by mouth daily before breakfast. 05/24/21   Philemon Kingdom, MD  lurasidone (LATUDA) 20 MG TABS tablet Take 1 tablet (20 mg total) by mouth daily. Take with food (dinner) 04/08/22   Vista Mink, MD  ondansetron (ZOFRAN-ODT) 4 MG disintegrating tablet Take 1 tablet (4 mg total) by mouth every 8 (eight) hours as needed for nausea or vomiting. Patient not taking: Reported on 04/15/2022 03/02/22   Rex Kras, PA  opium-belladonna (B&O SUPPRETTES) 16.2-60 MG suppository Place 1 suppository rectally as needed for bladder spasms. Patient not taking: Reported on 04/15/2022 04/12/22   Suella Broad A, PA-C  OVER THE COUNTER MEDICATION Take 1 Dose by mouth daily. Mushroom complex powder, 1 dose = 1 teaspoon    [provider]  Oxycodone HCl 10 MG TABS Take 1 tablet (10 mg total) by mouth every 6 (six) hours as needed. Post op pain 04/22/22   Aletha Halim, MD  Prenatal Multivit-Min-Fe-FA (PRE-NATAL PO) Take 1 tablet by mouth daily.    [provider]  promethazine (PHENERGAN) 25 MG suppository Place 1 suppository (25 mg total) rectally every 6 (six) hours as needed for nausea or vomiting. 04/16/22   Drenda Freeze, MD  propranolol (INDERAL) 20 MG tablet Take 1 tablet (20 mg total) by mouth 2 (two) times daily as needed (for anxiety). 04/08/22   Vista Mink, MD  sodium chloride (OCEAN) 0.65 % SOLN nasal spray Place 1 spray into both nostrils as needed for congestion.    [provider]  spironolactone (ALDACTONE) 25 MG tablet Take 1 tablet (25 mg total) by mouth daily. Patient not taking: Reported on 04/15/2022 09/19/21   Freada Bergeron, MD  SYMBICORT 80-4.5 MCG/ACT inhaler Inhale 2 puffs into the lungs 2 (two) times daily as needed (shortness of breath).    [provider]  valsartan (DIOVAN) 320 MG tablet Take 1 tablet (320 mg total) by mouth daily. 04/09/22   Skeet Latch, MD  venlafaxine XR (EFFEXOR XR) 37.5 MG 24 hr capsule Take 1 capsule (37.5 mg  total) by mouth daily. Take with 75 mg for a total of 112.5 mg for 7 days before decreasing to 75 mg daily for 7 days and then decrease to 37.5 mg for 7 days before discontinuing 04/08/22 04/08/23  Vista Mink, MD  venlafaxine XR (EFFEXOR-XR) 75 MG 24 hr capsule Take 75 mg by mouth daily with breakfast. Take with 75 mg for a total of 112.5 mg for 7 days before decreasing to 75 mg daily for 7 days and then decrease to 37.5 mg for 7 days before discontinuing    [provider]      Allergies    Coreg [carvedilol], Other, Red dye, Compazine [prochlorperazine maleate], Vicodin [hydrocodone-acetaminophen], Amitriptyline hcl, Duloxetine hcl, Ibuprofen, Lisinopril, Naproxen, Labetalol, Metoclopramide, and Nifedipine    Review of Systems   Review of Systems  Constitutional:  Positive for appetite change.  HENT: Negative.  Cardiovascular: Negative.   Gastrointestinal:  Positive for abdominal pain, nausea and vomiting. Negative for diarrhea.  Genitourinary:  Positive for difficulty urinating.  Musculoskeletal: Negative.   Neurological: Negative.     Physical Exam Updated Vital Signs BP 134/88   Pulse (!) 36   Temp 97.8 F (36.6 C)   Resp 13   Ht '5\' 11"'$  (1.803 m)   Wt 108 kg   LMP 04/13/2022 (Exact Date) Comment: DOS UPREG NEGATIVE  SpO2 100%   BMI 33.19 kg/m  Physical Exam Vitals and nursing note reviewed.  Constitutional:      Appearance: She is obese. She is not ill-appearing or toxic-appearing.  HENT:     Head: Normocephalic and atraumatic.     Mouth/Throat:     Mouth: Mucous membranes are moist.     Pharynx: No oropharyngeal exudate or posterior oropharyngeal erythema.  Eyes:     General:        Right eye: No discharge.        Left eye: No discharge.     Extraocular Movements: Extraocular movements intact.     Conjunctiva/sclera: Conjunctivae normal.     Pupils: Pupils are equal, round, and reactive to light.  Cardiovascular:     Rate and Rhythm: Normal rate and  regular rhythm.     Pulses: Normal pulses.     Heart sounds: Normal heart sounds. No murmur heard. Pulmonary:     Effort: Pulmonary effort is normal. No respiratory distress.     Breath sounds: Normal breath sounds. No wheezing or rales.  Abdominal:     General: Bowel sounds are normal. There is no distension.     Palpations: Abdomen is soft.     Tenderness: There is generalized abdominal tenderness and tenderness in the right lower quadrant, suprapubic area and left lower quadrant. There is no right CVA tenderness or left CVA tenderness.  Musculoskeletal:        General: No deformity.     Cervical back: Neck supple.  Skin:    General: Skin is warm and dry.  Neurological:     Mental Status: She is alert. Mental status is at baseline.  Psychiatric:        Mood and Affect: Mood normal.    ED Results / Procedures / Treatments   Labs (all labs ordered are listed, but only abnormal results are displayed) Labs Reviewed  CULTURE, BLOOD (ROUTINE X 2)  CULTURE, BLOOD (ROUTINE X 2)  CBC WITH DIFFERENTIAL/PLATELET  COMPREHENSIVE METABOLIC PANEL  LIPASE, BLOOD  URINALYSIS, ROUTINE W REFLEX MICROSCOPIC  LACTIC ACID, PLASMA  LACTIC ACID, PLASMA  I-STAT BETA HCG BLOOD, ED ([facility], [facility], [facility] ONLY)    EKG EKG Interpretation  Date/Time:  Wednesday April 23 2022 21:47:41 EST Ventricular Rate:  47 PR Interval:  150 QRS Duration: 110 QT Interval:  490 QTC Calculation: 433 R Axis:   37 Text Interpretation: Sinus bradycardia with marked sinus arrhythmia ST & T wave abnormality, consider inferior ischemia Abnormal ECG When compared with ECG of 05-Mar-2022 15:54, PREVIOUS ECG IS PRESENT Confirmed by Lacretia Leigh ([id number]) on 04/23/2022 9:56:43 PM  Radiology No results found.  Procedures Procedures    Medications Ordered in ED Medications - No data to display  ED Course/ Medical Decision Making/ A&P Clinical Course as of 04/24/22 0639  Thu Apr 24, 2022  0459 Lactic acid, plasma [RS]     Clinical Course User Index [RS] Arlander Gillen, Gypsy Balsam, PA-C  Medical Decision Making 32 year old female who presents with concern for abdominal pain.  Bradycardic on intake with heart rate in the 30s throughout the majority of her stay in the emergency department, new for her.  Cardiopulmonary exams unremarkable, abdominal exam with generalized tenderness palpation exquisitely so in the lower abdomen.  No CVAT.  DDx includes not limited to postop complication such as hematoma/seroma/early abscess, bowel obstruction, ileus, urinary tract infection, urinary retention.  Pain could also be related to her chronic interstitial cystitis.  Amount and/or Complexity of Data Reviewed Labs: ordered. Decision-making details documented in ED Course.    Details: CBC without leukocytosis or anemia, CMP unremarkable, lactic is normal, lipase is normal, UA without evidence of infection.  Urine culture pending. Radiology: ordered. ECG/medicine tests: ordered.  Risk Prescription drug management.   Foley catheter placed with output of 1200 cc, significant improvement in the pressure in the patient's lower abdomen though she still rates her pain 8 out of 10.  All the exact etiology of this patient's symptoms remains unclear do feel she is poorly controlled with her pain and is in combination with new bradycardia likely warrants medical admission.  Consult to cardiology x 3 without call back; disposition pending their consultation.  Suspect pain likely secondary to interstitial cystitis at this time but Foley should remain in place given acute urinary retention today.  Care of this patient signed out to oncoming ED provider Jamey Ripa, PA-C at time of shift change.  All pertinent HPI, physical exam and laboratory findings were discussed with her primary [facility].  Disposition ultimately pending completion of consultation and workup in the ED and discretion of oncoming ED  team.  Elder Negus voiced understanding of her medical evaluation and treatment plan. Each of their questions answered to their expressed satisfaction.    This chart was dictated using voice recognition software, Dragon. Despite the best efforts of this provider to proofread and correct errors, errors may still occur which can change documentation meaning.   Final Clinical Impression(s) / ED Diagnoses Final diagnoses:  None    Rx / DC Orders ED Discharge Orders     None         Aura Dials 04/24/22 [id number]    Ripley Fraise, MD 04/25/22 [phone]

## 2022-04-24 NOTE — ED Notes (Signed)
Bladder scan 574 ml

## 2022-04-25 DIAGNOSIS — E063 Autoimmune thyroiditis: Secondary | ICD-10-CM

## 2022-04-25 DIAGNOSIS — G8918 Other acute postprocedural pain: Secondary | ICD-10-CM

## 2022-04-25 DIAGNOSIS — G8929 Other chronic pain: Secondary | ICD-10-CM

## 2022-04-25 DIAGNOSIS — E038 Other specified hypothyroidism: Secondary | ICD-10-CM

## 2022-04-25 LAB — BASIC METABOLIC PANEL
Anion gap: 9 (ref 5–15)
BUN: 13 mg/dL (ref 6–20)
CO2: 25 mmol/L (ref 22–32)
Calcium: 8.9 mg/dL (ref 8.9–10.3)
Chloride: 104 mmol/L (ref 98–111)
Creatinine, Ser: 0.96 mg/dL (ref 0.44–1.00)
GFR, Estimated: >60 mL/min (ref >60)
Glucose, Bld: 95 mg/dL (ref 70–99)
Potassium: 3.4 mmol/L — ABNORMAL LOW (ref 3.5–5.1)
Sodium: 138 mmol/L (ref 135–145)

## 2022-04-25 LAB — CBC
HCT: 37.1 % (ref 36.0–46.0)
Hemoglobin: 12.7 g/dL (ref 12.0–15.0)
MCH: 32.9 pg (ref 26.0–34.0)
MCHC: 34.2 g/dL (ref 30.0–36.0)
MCV: 96.1 fL (ref 80.0–100.0)
Platelets: 289 10*3/uL (ref 150–400)
RBC: 3.86 MIL/uL — ABNORMAL LOW (ref 3.87–5.11)
RDW: 12.5 % (ref 11.5–15.5)
WBC: 5.6 10*3/uL (ref 4.0–10.5)
nRBC: 0 % (ref 0.0–0.2)

## 2022-04-25 LAB — MAGNESIUM: Magnesium: 2 mg/dL (ref 1.7–2.4)

## 2022-04-25 MED ORDER — CHLORHEXIDINE GLUCONATE CLOTH 2 % EX PADS
6.0000 | MEDICATED_PAD | Freq: Every day | CUTANEOUS | Status: DC
  Administered 2022-04-25 – 2022-04-30 (×6): 6 via TOPICAL

## 2022-04-25 MED ORDER — HYDROMORPHONE HCL 1 MG/ML IJ SOLN
0.5000 mg | Freq: Once | INTRAMUSCULAR | Status: AC
  Administered 2022-04-25: 0.5 mg via INTRAVENOUS
  Filled 2022-04-25: qty 1

## 2022-04-25 MED ORDER — MELATONIN 5 MG PO TABS
5.0000 mg | ORAL_TABLET | Freq: Every evening | ORAL | Status: DC | PRN
  Administered 2022-04-26 – 2022-04-28 (×3): 5 mg via ORAL
  Filled 2022-04-25 (×3): qty 1

## 2022-04-25 MED ORDER — OXYCODONE HCL 5 MG PO TABS
10.0000 mg | ORAL_TABLET | Freq: Four times a day (QID) | ORAL | Status: DC | PRN
  Administered 2022-04-25 – 2022-04-27 (×5): 10 mg via ORAL
  Filled 2022-04-25 (×6): qty 2

## 2022-04-25 MED ORDER — OXYCODONE HCL 5 MG PO TABS
5.0000 mg | ORAL_TABLET | ORAL | Status: DC | PRN
  Administered 2022-04-25: 5 mg via ORAL
  Filled 2022-04-25: qty 1

## 2022-04-25 MED ORDER — POTASSIUM CHLORIDE CRYS ER 20 MEQ PO TBCR
40.0000 meq | EXTENDED_RELEASE_TABLET | Freq: Once | ORAL | Status: AC
  Administered 2022-04-25: 40 meq via ORAL

## 2022-04-25 NOTE — Plan of Care (Signed)

## 2022-04-25 NOTE — Progress Notes (Signed)
Spoke with Dr. Alinda Money, urology, who reviewed her chart. States that she has exhausted all options with no clear picture that symptoms are from interstitial cystitis. She has been on gabapentin, oxybutynin, several other medications, and also received intravesical local anesthetics without receiving benefit. She was referred to Dr. Amalia Hailey (interstitial cystitis specialist at [facility]), unclear if she followed up with him. States that given normal UA and benign CT imaging, there would be no acute urological intervention that would benefit her at this time. Given chronicity of this pain, recommended treating as chronic pain and ultimately having her follow up with Dr. Amalia Hailey.

## 2022-04-25 NOTE — Consult Note (Signed)
Gynecology Consult  Date of Consult: 04/25/2022   Requesting Provider: Internal Medicine Residency Service  Primary OBGYN: [facility]  Reason for Consult: Chronic abdominal pain, recent surgery  History of Present Illness: Krystal [name] is a 32 y.o. [id number] (Patient's last menstrual period was 04/13/2022 (exact date).), with the above chief complaint.  She is s/p 2/27 laparoscopic left ovarian cystectomy for 6cm left ovarian cyst and chronic abdominal pain. Surgery was uncomplicated and entire cyst removed. Intraoperative findings also unremarkable; final pathology on cyst was benign.  Patient states the pain is in between the suprapubic are and the umbilicus in the midline and that the pain is the same that she had pre surgery but feels worse. She is passing flatus.    ROS: A 12-point review of systems was performed and negative, except as stated in the above HPI.  OBGYN History: As per HPI. OB History  Gravida Para Term Preterm AB Living  5 3 0 '3 2 2  '$ SAB IAB Ectopic Multiple Live Births  2 0 0 0 2    # Outcome Date GA Lbr Len/2nd Weight Sex Delivery Anes PTL Lv  5 Preterm 07/21/21 [redacted]w[redacted]d   CS-LTranv     4 SAB 08/2020          3 Preterm 09/14/19 [id number] g F CS-LTranv   LIV  2 Preterm 12/07/17 [id number] g F CS-LTranv Gen  LIV     Birth Comments: ELBW  1 SAB              Past Medical History: Past Medical History:  Diagnosis Date   Acute acalculous cholecystitis s/p lap cholecystectomy 05/11/2020 05/11/2020   Angio-edema 01/15/2021   Anxiety    Asthma    Depression    HELLP (hemolytic anemia/elev liver enzymes/low platelets in pregnancy) 12/08/2017   History of borderline personality disorder    History of HELLP syndrome, currently pregnant 04/21/2019   Formatting of this note might be different from the original.  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   Severe uncontrolled hypertension 10/08/2017   UTI (urinary tract infection)     Past Surgical History: Past Surgical History:  Procedure Laterality Date   APPENDECTOMY     BIOPSY  05/10/2020   Procedure: BIOPSY;  Surgeon: OutArta SilenceD;  [facility];  Service: Endoscopy;;   CESAREAN SECTION N/A 12/06/2017   Procedure: CESAREAN SECTION;  Surgeon: DavSloan LeiterD;  [facility] [place];  Laterality: N/A;   CESAREAN SECTION  07/21/2021   Procedure: CESAREAN SECTION;  Surgeon: PraDonnamae JudeD;  Location: [facility] [place] [facility];  Service: Obstetrics;;   CHOLECYSTECTOMY N/A 05/11/2020   Procedure: SINGLE SITE LAPAROSCOPIC CHOLECYSTECTOMY AND LIVER BIOSY;  Surgeon: GroMichael BostonD;  [facility];  Service: General;  Laterality: N/A;   ESOPHAGOGASTRODUODENOSCOPY (EGD) WITH PROPOFOL N/A 05/10/2020   Procedure: ESOPHAGOGASTRODUODENOSCOPY (EGD) WITH  PROPOFOL;  Surgeon: Arta Silence, MD;  [facility];  Service: Endoscopy;  Laterality: N/A;   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   HAND RECONSTRUCTION Right    INCISION AND DRAINAGE     LAPAROSCOPIC LYSIS OF ADHESIONS  04/22/2022   Procedure: LAPAROSCOPIC LYSIS OF ADHESIONS;  Surgeon: Aletha Halim, MD;  Location: [place];  Service: Gynecology;;   LAPAROSCOPIC OVARIAN CYSTECTOMY Left 04/22/2022   Procedure: LAPAROSCOPIC LEFT OVARIAN CYSTECTOMY;  Surgeon: Aletha Halim, MD;  Location: [place];  Service: Gynecology;  Laterality: Left;   NASAL SEPTUM SURGERY      Family  History:  Family History  Problem Relation Age of Onset   Hypertension Mother    Coronary artery disease Mother    Hypertension Father    Stroke Father    Atrial fibrillation Father    Heart failure Maternal Grandfather    Heart attack Maternal Grandfather     Social History:  Social History   Socioeconomic History   Marital status: Married    Spouse name: Not on file   Number of children: 3   Years of education: Not on file   Highest education level: 8th grade  Occupational History   Not on file  Tobacco Use   Smoking status: Never   Smokeless tobacco: Never   Tobacco comments:    She tried a cigarette and never smoked again  Vaping Use   Vaping Use: Never used  Substance and Sexual Activity   Alcohol use: Yes    Comment: bottle of wine a week-occ   Drug use: No   Sexual activity: Not Currently    Birth control/protection: Surgical  Other Topics Concern   Not on file  Social History Narrative   Not on file   Social Determinants of Health   Financial Resource Strain: Not on file  Food Insecurity: No Food Insecurity (04/24/2022)   Hunger Vital Sign    Worried About Running Out of Food in the Last Year: Never true    Ran Out of Food in the Last Year: Never true  Recent Concern: Food Insecurity - Food Insecurity Present (03/12/2022)   Hunger Vital Sign    Worried About Running Out of Food in the Last Year: Often true    Ran Out of Food in the Last Year: Often true  Transportation Needs: No Transportation Needs (04/24/2022)   PRAPARE - Hydrologist (Medical): No    Lack of Transportation (Non-Medical): No  Recent Concern: Transportation Needs - Unmet Transportation Needs (03/12/2022)   PRAPARE - Transportation    Lack of Transportation (Medical): Yes    Lack of Transportation (Non-Medical): Yes  Physical Activity: Inactive (04/09/2022)   Exercise Vital Sign    Days of Exercise per Week: 0 days    Minutes of Exercise per Session: 0 min   Stress: Not on file  Social Connections: Not on file  Intimate Partner Violence: Not At Risk (04/24/2022)   Humiliation, Afraid, Rape, and Kick questionnaire    Fear of Current or Ex-Partner: No    Emotionally Abused: No    Physically Abused: No    Sexually Abused: No   Allergy: Allergies  Allergen Reactions   Bee Venom Anaphylaxis and Swelling    Swelling of face, throat. Airway restriction.   Coreg [Carvedilol] Shortness Of Breath and Other (See Comments)    Triggers asthma   Red Dye Other (See Comments)    Migraines  Compazine [Prochlorperazine Maleate] Other (See Comments)    Twitching Able to tolerate promethazine   Vicodin [Hydrocodone-Acetaminophen] Itching, Nausea And Vomiting and Other (See Comments)    OK with Tylenol   Cymbalta [Duloxetine Hcl] Other (See Comments)    Irritability  Syncope    Ditropan [Oxybutynin] Other (See Comments)    Severe dry mouth   Elavil [Amitriptyline] Other (See Comments)    Irritability    Motrin [Ibuprofen] Other (See Comments)    Heartburn    Naprosyn [Naproxen] Other (See Comments)    Heartburn    Pyrimethamine Other (See Comments)    From Fansidar combination drug Triggers asthma   Sulfadoxine Other (See Comments)    From Fansidar combination drug Triggers asthma    Zestril [Lisinopril] Cough   Nifedical Xl [Nifedipine] Nausea And Vomiting   Reglan [Metoclopramide] Swelling and Other (See Comments)    Restlessness Able to take with Benadryl   Trandate [Labetalol] Nausea And Vomiting and Other (See Comments)    Triggers asthma     Current Outpatient Medications: Medications Prior to Admission  Medication Sig Dispense Refill Last Dose   acetaminophen (TYLENOL) 500 MG tablet Take 1,000 mg by mouth daily as needed for moderate pain, headache or fever.   Past Week   albuterol (VENTOLIN HFA) 108 (90 Base) MCG/ACT inhaler INHALE 2 PUFFS INTO THE LUNGS EVERY 4 HOURS AS NEEDED FOR WHEEZING OR SHORTNESS OR BREATH (Patient  taking differently: Inhale 2 puffs into the lungs every 6 (six) hours as needed for wheezing or shortness of breath.) 18 g 0 Past Week   cetirizine (ZYRTEC ALLERGY) 10 MG tablet Take 1 tablet (10 mg total) by mouth 2 (two) times daily. (Patient taking differently: Take 10 mg by mouth See admin instructions. Take 10 mg daily, may take a second 10 mg dose as needed for allergies) 60 tablet 5 Past Week   clonazePAM (KLONOPIN) 1 MG tablet Take 1 tablet (1 mg total) by mouth 3 (three) times daily as needed for anxiety (anxiety). (Patient taking differently: Take 1 mg by mouth 3 (three) times daily as needed for anxiety.) 90 tablet 0 Past Week   cloNIDine (CATAPRES - DOSED IN MG/24 HR) 0.3 mg/24hr patch Place 1 patch (0.3 mg total) onto the skin once a week. (Patient taking differently: Place 0.3 mg onto the skin every Sunday.) 4 patch 2 Past Week   diphenhydrAMINE (BENADRYL) 25 MG tablet Take 25 mg by mouth daily as needed for allergies.   Past Week   EPINEPHrine 0.3 mg/0.3 mL IJ SOAJ injection Inject 0.3 mg into the muscle as needed for anaphylaxis. 1 each 2 NEVER   famotidine (PEPCID) 20 MG tablet Take 1 tablet (20 mg total) by mouth 2 (two) times daily. 60 tablet 5 Past Week   FLUoxetine (PROZAC) 10 MG capsule Take 1 capsule (10 mg total) by mouth daily for 7 days, THEN 2 capsules (20 mg total) daily for 7 days, THEN 3 capsules (30 mg total) daily for 7 days, THEN 4 capsules (40 mg total) daily for 20 days. (Patient taking differently: 20 mg once daily.) 122 capsule 0 Past Week   gabapentin (NEURONTIN) 300 MG capsule Take 1 capsule (300 mg total) by mouth 2 (two) times daily. 60 capsule 0 Past Week   levothyroxine (SYNTHROID) 125 MCG tablet Take 1 tablet (125 mcg total) by mouth daily before breakfast. 45 tablet 3 Past Week   lurasidone (LATUDA) 20 MG TABS tablet Take 1 tablet (20 mg total) by mouth daily.  Take with food (dinner) (Patient taking differently: Take 20 mg by mouth daily with supper.) 30 tablet  1 Past Week   Multiple Vitamin (MULTIVITAMIN) tablet Take 1 tablet by mouth daily.   Past Week   Oxycodone HCl 10 MG TABS Take 1 tablet (10 mg total) by mouth every 6 (six) hours as needed. Post op pain 6 tablet 0 Past Week   polyethylene glycol powder (GLYCOLAX/MIRALAX) 17 GM/SCOOP powder Take 17 g by mouth daily as needed.   Past Week   propranolol (INDERAL) 20 MG tablet Take 1 tablet (20 mg total) by mouth 2 (two) times daily as needed (for anxiety). 60 tablet 1 04/23/2022 at unk   SYMBICORT 80-4.5 MCG/ACT inhaler Inhale 2 puffs into the lungs 2 (two) times daily as needed (shortness of breath).   Past Month   valsartan (DIOVAN) 320 MG tablet Take 1 tablet (320 mg total) by mouth daily. 90 tablet 3 Past Week   venlafaxine XR (EFFEXOR-XR) 75 MG 24 hr capsule Take 75 mg by mouth daily. Take with 75 mg for a total of 112.5 mg for 7 days before decreasing to 75 mg daily for 7 days and then decrease to 37.5 mg for 7 days before discontinuing   Past Week   amLODipine (NORVASC) 10 MG tablet Take 1 tablet (10 mg total) by mouth daily. (Patient not taking: Reported on 04/25/2022) 90 tablet 2 Not Taking   chlorthalidone (HYGROTON) 25 MG tablet Take 1 tablet (25 mg total) by mouth daily. (Patient not taking: Reported on 04/25/2022) 90 tablet 3 Not Taking   hydrOXYzine (ATARAX) 25 MG tablet Take 1 tablet (25 mg total) by mouth every 6 (six) hours. (Patient not taking: Reported on 04/15/2022) 12 tablet 0 Not Taking   ondansetron (ZOFRAN-ODT) 4 MG disintegrating tablet Take 1 tablet (4 mg total) by mouth every 8 (eight) hours as needed for nausea or vomiting. (Patient not taking: Reported on 04/15/2022) 20 tablet 0 Not Taking   opium-belladonna (B&O SUPPRETTES) 16.2-60 MG suppository Place 1 suppository rectally as needed for bladder spasms. (Patient not taking: Reported on 04/15/2022) 12 suppository 0 Not Taking   promethazine (PHENERGAN) 25 MG suppository Place 1 suppository (25 mg total) rectally every 6 (six) hours as  needed for nausea or vomiting. (Patient not taking: Reported on 04/25/2022) 12 each 0 Not Taking   spironolactone (ALDACTONE) 25 MG tablet Take 1 tablet (25 mg total) by mouth daily. (Patient not taking: Reported on 04/15/2022) 90 tablet 3 Not Taking   venlafaxine XR (EFFEXOR XR) 37.5 MG 24 hr capsule Take 1 capsule (37.5 mg total) by mouth daily. Take with 75 mg for a total of 112.5 mg for 7 days before decreasing to 75 mg daily for 7 days and then decrease to 37.5 mg for 7 days before discontinuing (Patient not taking: Reported on 04/25/2022) 14 capsule 0 Not Taking     Hospital Medications: Current Facility-Administered Medications  Medication Dose Route Frequency Provider Last Rate Last Admin   acetaminophen (TYLENOL) tablet 1,000 mg  1,000 mg Oral TID Lacinda Axon, MD   1,000 mg at 04/25/22 1627   albuterol (PROVENTIL) (2.5 MG/3ML) 0.083% nebulizer solution 2.5 mg  2.5 mg Inhalation Q4H PRN Lacinda Axon, MD       Chlorhexidine Gluconate Cloth 2 % PADS 6 each  6 each Topical Q0600 Velna Ochs, MD   6 each at 04/25/22 0953   clonazePAM (KLONOPIN) tablet 1 mg  1 mg Oral TID PRN Lacinda Axon, MD  1 mg at 04/25/22 1624   enoxaparin (LOVENOX) injection 50 mg  50 mg Subcutaneous Q24H Lacinda Axon, MD   50 mg at 04/25/22 1320   FLUoxetine (PROZAC) capsule 20 mg  20 mg Oral Daily Lacinda Axon, MD   20 mg at 04/25/22 [id number]   gabapentin (NEURONTIN) capsule 300 mg  300 mg Oral BID Lacinda Axon, MD   300 mg at 04/25/22 [id number]   hydrALAZINE (APRESOLINE) injection 10 mg  10 mg Intravenous Q6H PRN Margie Billet, PA-C       HYDROmorphone (DILAUDID) injection 1 mg  1 mg Intravenous Q4H PRN Lacinda Axon, MD   1 mg at 04/25/22 1443   levothyroxine (SYNTHROID) tablet 150 mcg  150 mcg Oral Q0600 Lacinda Axon, MD   150 mcg at 04/25/22 0630   lurasidone (LATUDA) tablet 20 mg  20 mg Oral Daily Lacinda Axon, MD   20 mg at 04/25/22 0925    mometasone-formoterol (DULERA) 100-5 MCG/ACT inhaler 2 puff  2 puff Inhalation BID Lacinda Axon, MD   2 puff at 04/25/22 0851   ondansetron (ZOFRAN) tablet 4 mg  4 mg Oral Q6H PRN Lacinda Axon, MD       Or   ondansetron [facility]) injection 4 mg  4 mg Intravenous Q6H PRN Lacinda Axon, MD   4 mg at 04/25/22 0630   oxyCODONE (Oxy IR/ROXICODONE) immediate release tablet 5 mg  5 mg Oral Q4H PRN Virl Axe, MD   5 mg at 04/25/22 1320   polyethylene glycol (MIRALAX / GLYCOLAX) packet 17 g  17 g Oral BID Freada Bergeron, MD   17 g at 04/25/22 0925   venlafaxine XR (EFFEXOR-XR) 24 hr capsule 75 mg  75 mg Oral Q breakfast Lacinda Axon, MD   75 mg at 04/25/22 [id number]     Physical Exam:  Current Vital Signs 24h Vital Sign Ranges  T 98 F (36.7 C) Temp  Avg: 97.8 F (36.6 C)  Min: 97.6 F (36.4 C)  Max: 98 F (36.7 C)  BP (!) 168/109 BP  Min: 116/74  Max: 168/109  HR 68 Pulse  Avg: 45.6  Min: 33  Max: 68  RR 18 Resp  Avg: 17.5  Min: 16  Max: 20  SaO2 100 % Room Air SpO2  Avg: 99.5 %  Min: 98 %  Max: 100 %       24 Hour I/O Current Shift I/O  Time Ins Outs 02/29 0701 - 03/01 0700 In: 1632.2 [P.O.:960; I.V.:672.2] Out: 1700 [Urine:1700] No intake/output data recorded.   Patient Vitals for the past 24 hrs:  BP Temp Temp src Pulse Resp SpO2  04/25/22 1620 (!) 168/109 98 F (36.7 C) Oral 68 18 100 %  04/25/22 1139 125/72 -- -- 64 20 100 %  04/25/22 0849 -- -- -- (!) 45 18 100 %  04/25/22 0758 120/70 98 F (36.7 C) Oral (!) 40 16 100 %  04/25/22 0333 124/74 97.7 F (36.5 C) Oral (!) 37 17 100 %  04/25/22 0016 118/67 97.6 F (36.4 C) Oral (!) 33 17 100 %  04/24/22 2300 -- -- -- (!) 33 -- 98 %  04/24/22 2046 116/74 97.7 F (36.5 C) Oral (!) 45 17 98 %  04/24/22 1711 -- 97.7 F (36.5 C) -- -- 17 --    Body mass index is 33.19 kg/m. General appearance: Well nourished, well developed female. Tearful at times but overall appears somewhat  comfortable Respiratory:  Normal respiratory effort Abdomen: soft, nttp, nd. Well healed laparoscopic port sites x 4 with dermabond over them. Old dried blood at the umbilical incision. Patient points to area inbetween suprapubic site and umbilicus for her discomfort.  Neuro/Psych:  Normal mood and affect.  Skin:  Warm and dry.   Laboratory: Recent Labs  Lab 04/24/22 0215 04/25/22 1102  WBC 9.2 5.6  HGB 12.4 12.7  HCT 36.1 37.1  PLT 262 289   Recent Labs  Lab 04/24/22 0215 04/25/22 1102  NA 139 138  K 3.8 3.4*  CL 103 104  CO2 27 25  BUN 16 13  CREATININE 1.03* 0.96  CALCIUM 9.3 8.9  PROT 6.7  --   BILITOT 0.7  --   ALKPHOS 41  --   ALT 21  --   AST 19  --   GLUCOSE 118* 95    Imaging:  Narrative & Impression  CLINICAL DATA:  Postop abdominal pain. Procedure to remove cyst on left ovary yesterday. Difficulty urinating.   EXAM: CT ABDOMEN AND PELVIS WITH CONTRAST   TECHNIQUE: Multidetector CT imaging of the abdomen and pelvis was performed using the standard protocol following bolus administration of intravenous contrast.   RADIATION DOSE REDUCTION: This exam was performed according to the departmental dose-optimization program which includes automated exposure control, adjustment of the mA and/or kV according to patient size and/or use of iterative reconstruction technique.   CONTRAST:  132m OMNIPAQUE IOHEXOL 350 MG/ML SOLN   COMPARISON:  04/12/2022.   FINDINGS: Lower chest: Mild emphysematous changes are present in the lungs. There is atelectasis at the lung bases.   Hepatobiliary: No focal liver abnormality is seen. Mild fatty infiltration of the liver is noted. Status post cholecystectomy. No biliary dilatation.   Pancreas: Unremarkable. No pancreatic ductal dilatation or surrounding inflammatory changes.   Spleen: Normal in size without focal abnormality.   Adrenals/Urinary Tract: The adrenal glands are within normal limits. The kidneys  enhance symmetrically. No renal calculus or hydronephrosis. A Foley catheter is present in the urinary bladder in the bladder is decompressed.   Stomach/Bowel: Stomach is within normal limits. Appendix is not seen. No evidence of bowel wall thickening, distention, or inflammatory changes.   Vascular/Lymphatic: No significant vascular findings are present. No enlarged abdominal or pelvic lymph nodes.   Reproductive: The uterus is in-situ.  No adnexal mass.   Other: A small amount of free fluid is present in the cul-de-sac. Trace amount of free air is noted in the upper abdomen and at the umbilicus in low anterior abdominal wall subcutaneous tissues, likely related to recent surgery. No abscess or hematoma is seen.   Musculoskeletal: No acute osseous abnormality.   IMPRESSION: 1. Foci of free air in the upper abdomen, umbilicus, and low anterior abdominal wall, likely related to history of recent surgery. No hematoma or abscess is seen. 2. Trace amount of free fluid in the cul-de-sac. 3. Hepatic steatosis. 4. Foley catheter in the urinary bladder.     Electronically Signed   By: LBrett FairyM.D.   On: 04/24/2022 04:37    Assessment: Ms. CTruscottis a 32y.o. [id number] for bradycardia and chronic abdominal pain; patient stable  Plan: Discussion with patient that her pain is not related to her recent surgery or GYN organs. I told her, just like I did pre-op, that I didn't think the LO cyst was causing her pain, but no other work up would be done until the cyst was resolved.  Since her s/s are the same as they were pre-surgery (feels worse but feels the same in terms of character) I told her that I don't feel it's related to anything GYN  I spoke to her primary team and d/w them the above assessment and that they may want to consider Urology see her to weigh in since she was seeing them for interstitial cystitis; my suspicion for IC is extremely low but it may be worth  considering having them weigh in .   Total time taking care of the patient was 30 minutes, with greater than 50% of the time spent in face to face interaction with the patient.  Durene Romans MD Attending [facility] [place] [facility] GYN Consult Phone: [phone] (M-F, [phone]) & [phone] (Off hours, weekends, holidays)

## 2022-04-25 NOTE — Progress Notes (Addendum)
Rounding Note    Patient Name: Krystal [name] Date of Encounter: 04/25/2022  [facility] Cardiologist: Freada Bergeron, MD   Subjective   Patient continues to have sharp lower abdominal pain. Denies chest pain, shortness of breath. Does have some abdominal pain when she tries to take a deep breath, so her breathing has been shallow. Feels "woozy" and lightheaded when her HR gets low. Has not been out of bed, no syncope or near syncope.   Inpatient Medications    Scheduled Meds:  acetaminophen  1,000 mg Oral TID   enoxaparin (LOVENOX) injection  50 mg Subcutaneous Q24H   FLUoxetine  20 mg Oral Daily   gabapentin  300 mg Oral BID   levothyroxine  150 mcg Oral Q0600   lurasidone  20 mg Oral Daily   mometasone-formoterol  2 puff Inhalation BID   polyethylene glycol  17 g Oral BID   venlafaxine XR  75 mg Oral Q breakfast   Continuous Infusions:  PRN Meds: albuterol, clonazePAM, hydrALAZINE, HYDROmorphone (DILAUDID) injection, ondansetron **OR** ondansetron (ZOFRAN) IV, oxyCODONE   Vital Signs    Vitals:   04/24/22 2300 04/25/22 0016 04/25/22 0333 04/25/22 0758  BP:  118/67 124/74 120/70  Pulse: (!) 33 (!) 33 (!) 37 (!) 40  Resp:  '17 17 16  '$ Temp:  97.6 F (36.4 C) 97.7 F (36.5 C) 98 F (36.7 C)  TempSrc:  Oral Oral Oral  SpO2: 98% 100% 100% 100%  Weight:      Height:        Intake/Output Summary (Last 24 hours) at 04/25/2022 0834 Last data filed at 04/25/2022 [id number] Gross per 24 hour  Intake 1632.18 ml  Output 1700 ml  Net -67.82 ml      04/23/2022    5:50 PM 04/22/2022    8:29 AM 04/15/2022   10:29 AM  Last 3 Weights  Weight (lbs) 238 lb 238 lb 238 lb 8 oz  Weight (kg) 107.956 kg 107.956 kg 108.183 kg      Telemetry    Sinus bradycardia, HR predominantly in the 30s - Personally Reviewed  ECG     No new tracings - Personally Reviewed  Physical Exam   GEN: No acute distress.  Appears uncomfortable in the bed. Laying flat with head  elevated  Neck: No JVD with head at 30 degrees  Cardiac: Bradycardic. no murmurs, rubs, or gallops. Right radial pulse 2+  Respiratory: Clear to auscultation bilaterally. Normal WOB on room air  GI: Soft, tenderness to palpation in the lower quadrants  MS: No edema in BLE. S/p left hand amputation  Neuro:  Nonfocal  Psych: Normal affect   Labs    High Sensitivity Troponin:  No results for input(s): "TROPONINIHS" in the last 720 hours.   Chemistry Recent Labs  Lab 04/24/22 0215  NA 139  K 3.8  CL 103  CO2 27  GLUCOSE 118*  BUN 16  CREATININE 1.03*  CALCIUM 9.3  PROT 6.7  ALBUMIN 4.1  AST 19  ALT 21  ALKPHOS 41  BILITOT 0.7  GFRNONAA >60  ANIONGAP 9    Lipids No results for input(s): "CHOL", "TRIG", "HDL", "LABVLDL", "LDLCALC", "CHOLHDL" in the last 168 hours.  Hematology Recent Labs  Lab 04/24/22 0215  WBC 9.2  RBC 3.81*  HGB 12.4  HCT 36.1  MCV 94.8  MCH 32.5  MCHC 34.3  RDW 12.4  PLT 262   Thyroid  Recent Labs  Lab 04/24/22 1201  TSH 9.236*  FREET4 0.71    BNPNo results for input(s): "BNP", "PROBNP" in the last 168 hours.  DDimer No results for input(s): "DDIMER" in the last 168 hours.   Radiology    CT ABDOMEN PELVIS W CONTRAST  Result Date: 04/24/2022 CLINICAL DATA:  Postop abdominal pain. Procedure to remove cyst on left ovary yesterday. Difficulty urinating. EXAM: CT ABDOMEN AND PELVIS WITH CONTRAST TECHNIQUE: Multidetector CT imaging of the abdomen and pelvis was performed using the standard protocol following bolus administration of intravenous contrast. RADIATION DOSE REDUCTION: This exam was performed according to the departmental dose-optimization program which includes automated exposure control, adjustment of the mA and/or kV according to patient size and/or use of iterative reconstruction technique. CONTRAST:  157m OMNIPAQUE IOHEXOL 350 MG/ML SOLN COMPARISON:  04/12/2022. FINDINGS: Lower chest: Mild emphysematous changes are present in the  lungs. There is atelectasis at the lung bases. Hepatobiliary: No focal liver abnormality is seen. Mild fatty infiltration of the liver is noted. Status post cholecystectomy. No biliary dilatation. Pancreas: Unremarkable. No pancreatic ductal dilatation or surrounding inflammatory changes. Spleen: Normal in size without focal abnormality. Adrenals/Urinary Tract: The adrenal glands are within normal limits. The kidneys enhance symmetrically. No renal calculus or hydronephrosis. A Foley catheter is present in the urinary bladder in the bladder is decompressed. Stomach/Bowel: Stomach is within normal limits. Appendix is not seen. No evidence of bowel wall thickening, distention, or inflammatory changes. Vascular/Lymphatic: No significant vascular findings are present. No enlarged abdominal or pelvic lymph nodes. Reproductive: The uterus is in-situ.  No adnexal mass. Other: A small amount of free fluid is present in the cul-de-sac. Trace amount of free air is noted in the upper abdomen and at the umbilicus in low anterior abdominal wall subcutaneous tissues, likely related to recent surgery. No abscess or hematoma is seen. Musculoskeletal: No acute osseous abnormality. IMPRESSION: 1. Foci of free air in the upper abdomen, umbilicus, and low anterior abdominal wall, likely related to history of recent surgery. No hematoma or abscess is seen. 2. Trace amount of free fluid in the cul-de-sac. 3. Hepatic steatosis. 4. Foley catheter in the urinary bladder. Electronically Signed   By: LBrett FairyM.D.   On: 04/24/2022 04:37    Cardiac Studies   Echocardiogram 06/17/21 1. Global longitudinal strain is -20%. Left ventricular ejection  fraction, by estimation, is 60 to 65%. The left ventricle has normal  function. The left ventricle has no regional wall motion abnormalities.  There is mild left ventricular hypertrophy.  Left ventricular diastolic parameters were normal.   2. Right ventricular systolic function is  normal. The right ventricular  size is normal.   3. Mild mitral valve regurgitation.   4. The aortic valve is tricuspid. Aortic valve regurgitation is not  visualized. Aortic valve sclerosis is present, with no evidence of aortic  valve stenosis.   5. The inferior vena cava is normal in size with greater than 50%  respiratory variability, suggesting right atrial pressure of 3 mmHg.   Patient Profile     32y.o. 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 Patient reports that her last dose of propranolol was the AM of 2/28. Clonidine patch was removed yesterday. Discussed with pharmacy, clonidine patch has  a half life of 20 hours - Per telemetry, patient remains bradycardic this AM with HR predominantly in the 30s. Patient has not gotten out of bed, does feel "woozy" and lightheaded when HR slow. 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 Primary team adjusting levothyroxine  - If HR fails to improve, may need EP consult - Continue to monitor on telemetry. Consider outpatient monitor at discharge    Resistant HTN  - For now, hold home BP medications while HR is in the 30s. When able, plan to resume amlodipine, chlorthalidone, spironolactone, and valsartan  - Ordered PRN hydralazine for SBP >160   Otherwise per primary  - Hashimoto Thyroiditis  - Abdominal pain, flank pain, interstitial nephritis  - Anxiety, depression, borderline personality disorder   For questions or updates, please contact Page Please consult  [email or web address] for contact info under        Signed, Margie Billet, PA-C  04/25/2022, 8:34 AM    Patient seen and examined and agree with Vikki Ports, PA-C as detailed above.   In brief, the patient is a 32 y.o. 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   Patient with known resistant hypertension and history of HELLP syndrome. She has required frequent adjustments to her antihypertensives as outpatient both due to med intolerances as well as severe episodes of HTN requiring frequent readmissions. Also with a lot of anxiety, stress and PTSD with frequent episodes of vomiting making it difficult to tolerate PO meds. She was recently started on clonidine patch at HTN clinic.   She presents on this admission following recent laparoscopic left ovarian cystectomy with worsening abdominal pain. CT abdomen with post-surgical changes but no acute pathology. Notably was retaining urine and symptoms improved with placement of a foley catheter. While abdominal pain and urinary retention can certainly lead to increased vagal tone, her sinus bradycardia has persisted suggesting this is likely driven mainly be a medication effect in the setting of clonidine and propranolol use. This meds have since been held but it will take several days for clonidine to wash out. She is otherwise HD stable with BP in 120s. Lactate normal. Renal function relatively stable with low concern for low output state and no current indication for pacing. Of note, her HR augments to the 60s with her discomfort suggesting her sinus node is already recovering from the medication effects.   Today, she is  expressing significant stress, anxiety and the concern that she is "no use to her family" as she is "constantly sick." She would like to speak with psychiatry further and the primary team was notified.    GEN: Tearful on exam  Neck: No JVD Cardiac: Bradycardic but HR augmented during exam, no murmurs Respiratory: Clear to auscultation bilaterally. GI: Soft, diffusely tender MS: No edema; warm Neuro:  Nonfocal  Psych: Anxious, tearful   Plan: -BP stable, Lactate/cr normal, and HR already augmenting with pain/movement with no indication for pacing at this time -Holding nodal agents including propranolol and clonidine patch; will take several days to wash out -Hold antihypertensive  meds for now given degree of bradycardia and SBP is normal -Management of hypothyroidism per primary -Recommend psych evaluation as patient is expressing that she wants to be DNR and is of "no use to her family with her constantly being sick"; she has seen psych on admissions in the past and she is requesting to see them again -Post op care per primary   Gwyndolyn Kaufman, MD

## 2022-04-25 NOTE — Care Management (Signed)
  Transition of Care (TOC) Screening Note   Patient Details  Name: Krystal [name] Date of Birth: [birth year]   Transition of Care [facility]) CM/SW Contact:    Bethena Roys, RN Phone Number: 04/25/2022, 1:22 PM    Transition of Care Department [facility]) has reviewed the patient. Patient presented for abdominal pain-left ovarian cyst. Case Manager spoke with patient regarding medication assistance consult. Patient states she was not working prior to hospitalization-has no insurance and is without PCP. Case Manager spoke with patient regarding a hospital follow up appointment with the [facility] and the patient is agreeable. Case Manager did call; however, the office was closed. Case Manager will follow for [facility] assistance once stable to transition home. Case Manager will continue to monitor patient advancement through interdisciplinary progression rounds. If new patient transition needs arise, please place a TOC consult.

## 2022-04-25 NOTE — Progress Notes (Signed)
Interval history She is having a lot of pain in her lower abdomen. This has been present since her surgery. She is complaining of lightheadedness/dizziness and some chest tightness. Chest tightness is worse when in pain. No shortness of breath, N/V since admission.   Physical exam Blood pressure 125/72, pulse 64, temperature 98 F (36.7 C), temperature source Oral, resp. rate 20, height '5\' 11"'$  (1.803 m), weight 108 kg, last menstrual period 04/13/2022, SpO2 100 %, not currently breastfeeding.  General: uncomfortable appearing young woman, sitting up in bed, NAD. CV: bradycardic rate with regular rhythm, no m/r/g. No peripheral edema. Pulm: CTABL, no adventitious sounds noted. Normal WOB on RA. Abdomen: soft, nondistended. TTP of lower abdomen. One surgical incision noted to be slightly dehisced, but otherwise c/d/I. Hypoactive bowel sounds. MSK: s/p amputation of left hand. R wrist reconstructed with healed surgical scare.  Skin: warm and dry. Neuro: AAOx3, no focal deficits noted. Psych: tearful, appropriate affect.   Assessment and plan Hospital day 0  Krystal [name] is a 32 y.o. 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  Principal Problem:   Bradycardia Active Problems:   Hypothyroidism due to Hashimoto's thyroiditis   Abdominal pain, chronic, right lower quadrant  #Sinus bradycardia No high degree AV block on EKG.  Today, reports some chest pressure and lightheadedness.  Otherwise hemodynamically stable.  Discontinued AV nodal blockade agents and clonidine, awaiting washout. Screen for adrenal insufficiency.  Treating hypothyroidism.  Appreciate cardiology assistance with this case.  -Cardiology consulted, appreciate recs -Hold amlodipine, propranolol, and  clonidine -Telemetry - if HR fails to improve, will need to consider EP consult - intervention with pacing and possible atropine should there be an acute BP drop or change in mentation   #Abd pain Patient with history notable for chronic intermittent diarrhea, postpartum autoimmune thyroiditis, steatosis, and chronic abdominal pain in this relatively young Caucasian female. Concern for celiac disease or other enteropathy. She had an upper endoscopy in 2022 but duodenal biopsies were not taken at that time. Bland UA make interstitial nephritis/cystitis less likely. One of her laparoscopic incisions appears slightly dehisced, otherwise no concern for intra-abdominal infection.  Screen for celiac with anti-tTG antibodies. -consulted OBGYN given recent laparoscopic ovarian cystectomy -Tylenol 1000 mg 3 times daily -Oxycodone 5 mg q6h prn for moderate, severe pain -IV Dilaudid 1 mg q4h prn for breakthrough pain -Resume home gabapentin 300 mg twice daily -preliminary urine cultures negative -F/u blood culture, trend fever curve -f/u anti-tTG antibody and morning cortisol -may need GI consult pending results  #Urinary retention Treating as anesthetic complication from recent OB/GYN surgery.  Trial of void tomorrow. - Maintain Foley   #Resistant Hypertension Holding home antihypertensives in setting of bradycardia. SBP maintaining in 120s-150s. -Hold antihypertensive meds for today -IV hydralazine 10 mg as needed for SBP >160   #Hashimoto thyroiditis Increased Synthroid.  May be contributing to bradycardia. -Synthroid 150 mcg daily -Repeat TSH mid April 2024   #Anxiety/depression #Borderline personality disorder Patient reports he is currently being tapered off venlafaxine with uptitrating of her Prozac. Has significant trauma from recent medical events. Expressed concerns about mood to consulting cardiologist, including feelings of worthlessness and wish to be changed to DNR/DNI. Discussed  with psychiatry via phone call, patient is noted to have borderline  personality and splitting. They stated that she does not have a history of suicide attempts and believe her risk to be low. Advised to continue current medications and follow up with outpatient psychiatry. -Venlafaxine 75 mg daily -Prozac 20 mg daily -Latuda 20 mg daily -Clonazepam 1 mg TID PRN for anxiety - f/u with outpatient psychiatry - chaplain services ordered   #Asthma Respiratory status stable. No wheezing or rales on exam. Home regimen includes Symbicort and PRN Ventolin. -Resume as needed albuterol inhaler -Dulera 2 puffs twice daily  Diet: Regular IVF: None VTE: Enoxaparin Code: Full  Virl Axe MD 04/25/2022, 11:54 AM  Pager: [phone] After 5pm on weekdays and 1pm on weekends: [phone]

## 2022-04-25 NOTE — Hospital Course (Addendum)
Principal Problem:   Bradycardia Active Problems:   Hypothyroidism due to Hashimoto's thyroiditis   Abdominal pain, chronic, right lower quadrant   Post-operative pain  Resolved Problems:   * No resolved hospital problems. *   She is still vomiting up intermittently. Phenergan is making her more loopy. She is feeling nauseous at this time. No chest pain, SHOB. Stomach pain is in the same place. Has been having constant pain since the IV pain medications wore off. Hurting more when she urinates. Not having burning with urination today. Vision is back to normal. She had a BM last night and this morning.

## 2022-04-26 DIAGNOSIS — R001 Bradycardia, unspecified: Secondary | ICD-10-CM

## 2022-04-26 DIAGNOSIS — E2740 Unspecified adrenocortical insufficiency: Secondary | ICD-10-CM

## 2022-04-26 DIAGNOSIS — I10 Essential (primary) hypertension: Secondary | ICD-10-CM

## 2022-04-26 LAB — URINE CULTURE: Culture: NO GROWTH

## 2022-04-26 LAB — BASIC METABOLIC PANEL
Anion gap: 10 (ref 5–15)
BUN: 5 mg/dL — ABNORMAL LOW (ref 6–20)
CO2: 26 mmol/L (ref 22–32)
Calcium: 8.9 mg/dL (ref 8.9–10.3)
Chloride: 103 mmol/L (ref 98–111)
Creatinine, Ser: 0.92 mg/dL (ref 0.44–1.00)
GFR, Estimated: >60 mL/min (ref >60)
Glucose, Bld: 114 mg/dL — ABNORMAL HIGH (ref 70–99)
Potassium: 3.2 mmol/L — ABNORMAL LOW (ref 3.5–5.1)
Sodium: 139 mmol/L (ref 135–145)

## 2022-04-26 LAB — ACTH STIMULATION, 3 TIME POINTS
Cortisol, 30 Min: 8.5 ug/dL
Cortisol, 60 Min: 9.9 ug/dL
Cortisol, Base: <0.4 ug/dL

## 2022-04-26 LAB — CORTISOL: Cortisol, Plasma: <0.4 ug/dL

## 2022-04-26 LAB — GLUCOSE, CAPILLARY: Glucose-Capillary: 134 mg/dL — ABNORMAL HIGH (ref 70–99)

## 2022-04-26 MED ORDER — COSYNTROPIN 0.25 MG IJ SOLR: 0.2500 mg | Freq: Once | INTRAMUSCULAR | Status: DC

## 2022-04-26 MED ORDER — SODIUM CHLORIDE 0.9 % IV SOLN: INTRAVENOUS | Status: DC | PRN

## 2022-04-26 MED ORDER — HYDROCORTISONE SOD SUC (PF) 100 MG IJ SOLR
100.0000 mg | Freq: Three times a day (TID) | INTRAMUSCULAR | Status: AC
  Administered 2022-04-26 – 2022-04-27 (×3): 100 mg via INTRAVENOUS
  Filled 2022-04-26 (×3): qty 2

## 2022-04-26 MED ORDER — POTASSIUM CHLORIDE CRYS ER 20 MEQ PO TBCR
40.0000 meq | EXTENDED_RELEASE_TABLET | Freq: Two times a day (BID) | ORAL | Status: AC
  Administered 2022-04-26 (×2): 40 meq via ORAL
  Filled 2022-04-26 (×2): qty 2

## 2022-04-26 MED ORDER — COSYNTROPIN 0.25 MG IJ SOLR
0.2500 mg | Freq: Once | INTRAMUSCULAR | Status: AC
  Administered 2022-04-26: 0.25 mg via INTRAVENOUS
  Filled 2022-04-26: qty 0.25

## 2022-04-26 MED ORDER — HYDROCORTISONE SOD SUC (PF) 100 MG IJ SOLR
100.0000 mg | Freq: Once | INTRAMUSCULAR | Status: AC
  Administered 2022-04-26: 100 mg via INTRAVENOUS
  Filled 2022-04-26: qty 2

## 2022-04-26 MED ORDER — IRBESARTAN 150 MG PO TABS
150.0000 mg | ORAL_TABLET | Freq: Every day | ORAL | Status: DC
  Administered 2022-04-26: 150 mg via ORAL
  Filled 2022-04-26 (×2): qty 1

## 2022-04-26 MED ORDER — BISACODYL 5 MG PO TBEC: 5.0000 mg | DELAYED_RELEASE_TABLET | Freq: Every day | ORAL | Status: DC | PRN

## 2022-04-26 MED ORDER — HYDROCORTISONE SOD SUC (PF) 100 MG IJ SOLR
100.0000 mg | Freq: Two times a day (BID) | INTRAMUSCULAR | Status: DC
  Filled 2022-04-26: qty 2

## 2022-04-26 MED ORDER — HYDROMORPHONE HCL 1 MG/ML IJ SOLN
1.0000 mg | Freq: Once | INTRAMUSCULAR | Status: AC
  Administered 2022-04-26: 1 mg via INTRAVENOUS
  Filled 2022-04-26: qty 1

## 2022-04-26 MED ORDER — LURASIDONE HCL 20 MG PO TABS
20.0000 mg | ORAL_TABLET | Freq: Every day | ORAL | Status: DC
  Administered 2022-04-27 – 2022-05-03 (×7): 20 mg via ORAL
  Filled 2022-04-26 (×10): qty 1

## 2022-04-26 MED ORDER — BISACODYL 10 MG RE SUPP
10.0000 mg | Freq: Once | RECTAL | Status: AC
  Administered 2022-04-26: 10 mg via RECTAL
  Filled 2022-04-26: qty 1

## 2022-04-26 MED ORDER — SODIUM CHLORIDE 0.9 % IV SOLN
12.5000 mg | Freq: Once | INTRAVENOUS | Status: AC
  Administered 2022-04-26: 12.5 mg via INTRAVENOUS
  Filled 2022-04-26: qty 12.5

## 2022-04-26 NOTE — Progress Notes (Signed)
Subjective:   Summary: Krystal [name] is a 32 y.o. year old female currently admitted on the IMTS HD#1 for bradycardia.  Overnight Events: Urology and OBGYN weighed in, no acute interventions planned.  Had 2 episodes of nonbloody emesis overnight. Still having lower abdominal pain that comes and goes, worse with urination.  Has not had a bowel movement since 2/28.  No chest pain or trouble breathing.  Has some lightheadedness.  Discussed ongoing diagnostic process including OB/GYN and urology consults and answered all questions.  She is amenable to following up with Dr. Amalia Hailey on outpatient interstitial cystitis workup.  Also talked about diagnosis of adrenal insufficiency and initiating steroids today.  Also continue to workup for possible enteropathy.  Objective:  Vital signs in last 24 hours: Vitals:   04/25/22 0849 04/25/22 1139 04/25/22 1620 04/25/22 2016  BP:  125/72 (!) 168/109 (!) 148/85  Pulse: (!) 45 64 68 61  Resp: '18 20 18 18  '$ Temp:   98 F (36.7 C) 97.7 F (36.5 C)  TempSrc:   Oral Oral  SpO2: 100% 100% 100% 99%  Weight:      Height:       Supplemental O2: Room Air SpO2: 99 %   Physical Exam:  Constitutional: Uncomfortable, NAD Cardiovascular: Bradycardic, regular rhythm, no murmurs, rubs or gallops Pulmonary/Chest: normal work of breathing on room air Abdominal: soft, non-distended.  Tenderness of lower abdomen between umbilicus and suprapubic area. MSK:s/p amputation of left hand. R wrist reconstructed with healed surgical scar  Skin: warm and dry Extremities: upper/lower extremity pulses 2+, no lower extremity edema present  Filed Weights   04/23/22 1750  Weight: 108 kg     Intake/Output Summary (Last 24 hours) at 04/26/2022 0657 Last data filed at 04/25/2022 2200 Gross per 24 hour  Intake --  Output 2050 ml  Net -2050 ml   Net IO Since Admission: -3,217.82 mL [04/26/22 0657]  Pertinent Labs:    Latest Ref Rng & Units 04/25/2022    11:02 AM 04/24/2022    2:15 AM 04/16/2022    5:44 PM  CBC  WBC 4.0 - 10.5 K/uL 5.6  9.2  5.2   Hemoglobin 12.0 - 15.0 g/dL 12.7  12.4  13.8   Hematocrit 36.0 - 46.0 % 37.1  36.1  40.3   Platelets 150 - 400 K/uL 289  262  306        Latest Ref Rng & Units 04/26/2022    3:55 AM 04/25/2022   11:02 AM 04/24/2022    2:15 AM  CMP  Glucose 70 - 99 mg/dL 114  95  118   BUN 6 - 20 mg/dL '5  13  16   '$ Creatinine 0.44 - 1.00 mg/dL 0.92  0.96  1.03   Sodium 135 - 145 mmol/L 139  138  139   Potassium 3.5 - 5.1 mmol/L 3.2  3.4  3.8   Chloride 98 - 111 mmol/L 103  104  103   CO2 22 - 32 mmol/L '26  25  27   '$ Calcium 8.9 - 10.3 mg/dL 8.9  8.9  9.3   Total Protein 6.5 - 8.1 g/dL   6.7   Total Bilirubin 0.3 - 1.2 mg/dL   0.7   Alkaline Phos 38 - 126 U/L   41   AST 15 - 41 U/L   19   ALT 0 - 44 U/L  21     Imaging: CT abdomen IMPRESSION: 1. Foci of free air in the upper abdomen, umbilicus, and low anterior abdominal wall, likely related to history of recent surgery. No hematoma or abscess is seen. 2. Trace amount of free fluid in the cul-de-sac. 3. Hepatic steatosis. 4. Foley catheter in the urinary bladder.    EKG: Continues to be sinus bradycardic with rates in 50s-70s  Assessment/Plan:   Principal Problem:   Bradycardia Active Problems:   Hypothyroidism due to Hashimoto's thyroiditis   Abdominal pain, chronic, right lower quadrant   Post-operative pain   Patient Summary: Krystal [name] is a 32 y.o. 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    #Sinus bradycardia #Adrenal insufficiency Rates are slightly improved after holding amlodipine, propranolol, clonidine.  Also went up on her Synthroid dose to 150 yesterday.  Cortisol level this morning is diagnostic for adrenal  insufficiency.  Stim test pending.  She has some lightheadedness but is otherwise asymptomatic and hemodynamically stable. -Wait for stim test to be drawn and then start stress dose steroids -Cardiology consulted, appreciate recs -Hold amlodipine, propranolol, and clonidine -Telemetry - if HR fails to improve, will need to consider EP consult - intervention with pacing and possible atropine should there be an acute BP drop or change in mentation   #Abd pain #Constipation #Nausea vomiting Discussed with urology and OB/GYN and no acute hospital interventions planned.  She will have outpatient follow-up with Dr. Amalia Hailey for interstitial cystitis workup. One of her laparoscopic incisions appears slightly dehisced, otherwise no concern for intra-abdominal infection.  She was diagnosed with adrenal insufficiency today.  Stim test pending and then will start steroids.  Believe this can explain some of her abdominal pain, nausea and vomiting.  Screen for celiac with anti-tTG antibodies.  Will also treat symptomatically for nausea, constipation. -Appreciate OB/GYN, urology assistance.  Will follow-up with Dr. Amalia Hailey for interstitial cystitis workup in the outpatient setting -She has documented reactions to Compazine, red dye (senna), Vicodin, Cymbalta, oxybutynin, Elavil, NSAIDs, sulfadoxine, Reglan -MiraLAX and Dulcolax for constipation -ACTH stim test and steroids as above - Can consider antispasmodics as well if steroids do not improve her pain -Tylenol 1000 mg 3 times daily -Oxycodone 5 mg q6h prn for moderate, severe pain -IV Dilaudid 1 mg q4h prn for breakthrough pain -Resume home gabapentin 300 mg twice daily -F/u blood culture, trend fever curve -f/u anti-tTG antibody -may need GI consult pending results   #Urinary retention Treating as anesthetic complication from recent OB/GYN surgery.  Trial of void today. - Maintain Foley   #Resistant Hypertension Holding home antihypertensives in  setting of bradycardia.  She is trending more hypertensive overnight and this morning. -Appreciate cardiology assistance with antihypertensive management -IV hydralazine 10 mg as needed for SBP >160   #Hashimoto thyroiditis Synthroid increased.  Adrenal insufficiency management as above. -Synthroid 150 mcg daily -Repeat TSH mid April 2024   #Anxiety/depression #Borderline personality disorder Patient reports he is currently being tapered off venlafaxine with uptitrating of her Prozac. Has significant trauma from recent medical events. Expressed concerns about mood to consulting cardiologist, including feelings of worthlessness and wish to be changed to DNR/DNI. Discussed with psychiatry via phone call, patient is noted to have borderline personality and splitting. They stated that she does not have a history of suicide attempts and believe her risk to be  low. Advised to continue current medications and follow up with outpatient psychiatry. -Venlafaxine 75 mg daily -Prozac 20 mg daily -Latuda 20 mg daily -Clonazepam 1 mg TID PRN for anxiety - f/u with outpatient psychiatry - chaplain services ordered   #Asthma Respiratory status stable. No wheezing or rales on exam. Home regimen includes Symbicort and PRN Ventolin. -Resume as needed albuterol inhaler -Dulera 2 puffs twice daily  Diet:  dysphagia 3 IVF: None VTE: Enoxaparin Code: Full PT/OT recs: Pending TOC recs:  Family Update:   Dispo: Pending clinical improvement/subsequent PT/Ot eval  Linus Galas, MD PGY-1 Internal Medicine Resident Please contact the on call pager after 5 pm and on weekends at [phone].

## 2022-04-26 NOTE — Progress Notes (Signed)
Rounding Note    Patient Name: Krystal [name] Date of Encounter: 04/26/2022  [place] Cardiologist: Freada Bergeron, MD   Subjective   No acute events. Heart rate continues to improve, now 60 bpm sinus resting and up to 98 bpm. Discussed that BP is rising as well, plans to restart meds slowly.  Inpatient Medications    Scheduled Meds:  acetaminophen  1,000 mg Oral TID   Chlorhexidine Gluconate Cloth  6 each Topical Q0600   enoxaparin (LOVENOX) injection  50 mg Subcutaneous Q24H   FLUoxetine  20 mg Oral Daily   gabapentin  300 mg Oral BID   hydrocortisone sod succinate (SOLU-CORTEF) inj  50 mg Intravenous Q6H   irbesartan  150 mg Oral Daily   levothyroxine  150 mcg Oral Q0600   lurasidone  20 mg Oral Daily   mometasone-formoterol  2 puff Inhalation BID   polyethylene glycol  17 g Oral BID   potassium chloride  40 mEq Oral BID   venlafaxine XR  75 mg Oral Q breakfast   Continuous Infusions:  PRN Meds: albuterol, bisacodyl, clonazePAM, hydrALAZINE, HYDROmorphone (DILAUDID) injection, melatonin, ondansetron **OR** ondansetron (ZOFRAN) IV, oxyCODONE   Vital Signs    Vitals:   04/25/22 1620 04/25/22 2016 04/26/22 0805 04/26/22 1208  BP: (!) 168/109 (!) 148/85 (!) 156/98 (!) 158/103  Pulse: 68 61 65 (!) 50  Resp: '18 18 19 17  '$ Temp: 98 F (36.7 C) 97.7 F (36.5 C) 98.7 F (37.1 C) 98.5 F (36.9 C)  TempSrc: Oral Oral Oral Oral  SpO2: 100% 99% 98% 98%  Weight:      Height:        Intake/Output Summary (Last 24 hours) at 04/26/2022 1253 Last data filed at 04/26/2022 0809 Gross per 24 hour  Intake --  Output 3150 ml  Net -3150 ml      04/23/2022    5:50 PM 04/22/2022    8:29 AM 04/15/2022   10:29 AM  Last 3 Weights  Weight (lbs) 238 lb 238 lb 238 lb 8 oz  Weight (kg) 107.956 kg 107.956 kg 108.183 kg      Telemetry    Sinus bradycardia/sinus rhythm- Personally Reviewed  ECG    No new since 04/24/22- Personally Reviewed  Physical Exam    GEN: Well nourished, well developed in no acute distress NECK: No JVD CARDIAC: regular rhythm, normal S1 and S2, no rubs or gallops. No murmur. VASCULAR: Radial pulses 2+ bilaterally.  RESPIRATORY:  Clear to auscultation without rales, wheezing or rhonchi  ABDOMEN: Soft, non-tender, non-distended MUSCULOSKELETAL:  Moves all 4 limbs independently. S/P L hand amputation SKIN: Warm and dry, no edema NEUROLOGIC:  No focal neuro deficits noted. PSYCHIATRIC:  Normal affect    Labs    High Sensitivity Troponin:  No results for input(s): "TROPONINIHS" in the last 720 hours.   Chemistry Recent Labs  Lab 04/24/22 0215 04/25/22 1102 04/26/22 0355  NA 139 138 139  K 3.8 3.4* 3.2*  CL 103 104 103  CO2 '27 25 26  '$ GLUCOSE 118* 95 114*  BUN 16 13 5*  CREATININE 1.03* 0.96 0.92  CALCIUM 9.3 8.9 8.9  MG  --  2.0  --   PROT 6.7  --   --   ALBUMIN 4.1  --   --   AST 19  --   --   ALT 21  --   --   ALKPHOS 41  --   --   BILITOT 0.7  --   --  GFRNONAA >60 >60 >60  ANIONGAP '9 9 10    '$ Lipids No results for input(s): "CHOL", "TRIG", "HDL", "LABVLDL", "LDLCALC", "CHOLHDL" in the last 168 hours.  Hematology Recent Labs  Lab 04/24/22 0215 04/25/22 1102  WBC 9.2 5.6  RBC 3.81* 3.86*  HGB 12.4 12.7  HCT 36.1 37.1  MCV 94.8 96.1  MCH 32.5 32.9  MCHC 34.3 34.2  RDW 12.4 12.5  PLT 262 289   Thyroid  Recent Labs  Lab 04/24/22 1201  TSH 9.236*  FREET4 0.71    BNPNo results for input(s): "BNP", "PROBNP" in the last 168 hours.  DDimer No results for input(s): "DDIMER" in the last 168 hours.   Radiology    No results found.  Cardiac Studies   Echocardiogram 06/17/21 1. Global longitudinal strain is -20%. Left ventricular ejection  fraction, by estimation, is 60 to 65%. The left ventricle has normal  function. The left ventricle has no regional wall motion abnormalities.  There is mild left ventricular hypertrophy.  Left ventricular diastolic parameters were normal.   2. Right  ventricular systolic function is normal. The right ventricular  size is normal.   3. Mild mitral valve regurgitation.   4. The aortic valve is tricuspid. Aortic valve regurgitation is not  visualized. Aortic valve sclerosis is present, with no evidence of aortic  valve stenosis.   5. The inferior vena cava is normal in size with greater than 50%  respiratory variability, suggesting right atrial pressure of 3 mmHg.   Patient Profile     32 y.o. 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 Assessment & Plan    Bradycardia  - Patient's HR has gotten as low as 34 BPM this admission. She is hemodynamically stable, no indication for pacemaker at this time. - Holding propranolol, clonidine. Patient reports that her last dose of propranolol was the AM of 2/28. Clonidine patch was removed 2/29. Discussed with pharmacy, clonidine patch has a half life of 20 hours - Patient does have hashimoto thyroiditis- TSH 9.2 and free T4 0.71. Primary team adjusting levothyroxine  - Continue to monitor on telemetry. Consider outpatient monitor at discharge    Resistant HTN  -HR improving, plan to resume amlodipine, chlorthalidone, spironolactone, and valsartan gradually. Given low K, will start with ARB. Home dose is 320 mg valsartan; irbesartan is hospital alternative, and equivalent dose would be 300 mg. Will start with 150 mg to make sure she tolerates and then uptitrate -PRN hydralazine for SBP >160   Otherwise per primary  - Hashimoto Thyroiditis  - Abdominal pain, flank pain, interstitial nephritis  - Anxiety, depression, borderline personality disorder: Per Dr. Jacolyn Reedy note  "Recommend psych evaluation as patient is expressing that she wants to be DNR and is of "no use to her family with her constantly being sick"; she has seen psych on admissions in the past and she is requesting to see them again"  For questions or updates, please contact [place] Please consult [email or web address] for contact info under     Signed, Buford Dresser, MD  04/26/2022, 12:53 PM

## 2022-04-26 NOTE — Progress Notes (Signed)
Bladder scan results 308 mls. Patient requested for foley to be reinserted, stating she has had increased pelvic pain since the foley was dc'd today. Notified providers. Orders received for post void residual and reinsert foley cath if PVR greater than 100. Report to Glenna Durand, RN

## 2022-04-27 DIAGNOSIS — R339 Retention of urine, unspecified: Secondary | ICD-10-CM

## 2022-04-27 DIAGNOSIS — R109 Unspecified abdominal pain: Secondary | ICD-10-CM

## 2022-04-27 LAB — CBC
HCT: 41.6 % (ref 36.0–46.0)
Hemoglobin: 14.4 g/dL (ref 12.0–15.0)
MCH: 31.9 pg (ref 26.0–34.0)
MCHC: 34.6 g/dL (ref 30.0–36.0)
MCV: 92.2 fL (ref 80.0–100.0)
Platelets: 337 10*3/uL (ref 150–400)
RBC: 4.51 MIL/uL (ref 3.87–5.11)
RDW: 11.9 % (ref 11.5–15.5)
WBC: 9.6 10*3/uL (ref 4.0–10.5)
nRBC: 0 % (ref 0.0–0.2)

## 2022-04-27 LAB — GLIA (IGA/G) + TTG IGA
Antigliadin Abs, IgA: 3 units (ref 0–19)
Gliadin IgG: 2 units (ref 0–19)
Tissue Transglutaminase Ab, IgA: <2 U/mL (ref 0–3)

## 2022-04-27 LAB — RENAL FUNCTION PANEL
Albumin: 3.7 g/dL (ref 3.5–5.0)
Anion gap: 13 (ref 5–15)
BUN: 8 mg/dL (ref 6–20)
CO2: 25 mmol/L (ref 22–32)
Calcium: 9.8 mg/dL (ref 8.9–10.3)
Chloride: 100 mmol/L (ref 98–111)
Creatinine, Ser: 0.95 mg/dL (ref 0.44–1.00)
GFR, Estimated: >60 mL/min (ref >60)
Glucose, Bld: 149 mg/dL — ABNORMAL HIGH (ref 70–99)
Phosphorus: 3.2 mg/dL (ref 2.5–4.6)
Potassium: 4.5 mmol/L (ref 3.5–5.1)
Sodium: 138 mmol/L (ref 135–145)

## 2022-04-27 LAB — GLUCOSE, CAPILLARY: Glucose-Capillary: 112 mg/dL — ABNORMAL HIGH (ref 70–99)

## 2022-04-27 MED ORDER — HYDROCORTISONE SOD SUC (PF) 100 MG IJ SOLR
100.0000 mg | Freq: Three times a day (TID) | INTRAMUSCULAR | Status: AC
  Administered 2022-04-27 – 2022-04-28 (×2): 100 mg via INTRAVENOUS
  Filled 2022-04-27 (×3): qty 2

## 2022-04-27 MED ORDER — AMLODIPINE BESYLATE 10 MG PO TABS
10.0000 mg | ORAL_TABLET | Freq: Every day | ORAL | Status: DC
  Administered 2022-04-27 – 2022-05-05 (×8): 10 mg via ORAL
  Filled 2022-04-27 (×9): qty 1

## 2022-04-27 MED ORDER — IRBESARTAN 150 MG PO TABS
300.0000 mg | ORAL_TABLET | Freq: Every day | ORAL | Status: DC
  Administered 2022-04-27 – 2022-05-05 (×8): 300 mg via ORAL
  Filled 2022-04-27 (×8): qty 2

## 2022-04-27 MED ORDER — HYDROCORTISONE SOD SUC (PF) 100 MG IJ SOLR
50.0000 mg | Freq: Once | INTRAMUSCULAR | Status: DC
  Filled 2022-04-27: qty 1

## 2022-04-27 MED ORDER — HYDROMORPHONE HCL 1 MG/ML IJ SOLN
1.0000 mg | Freq: Four times a day (QID) | INTRAMUSCULAR | Status: DC | PRN
  Administered 2022-04-27 – 2022-04-28 (×4): 1 mg via INTRAVENOUS
  Filled 2022-04-27 (×4): qty 1

## 2022-04-27 MED ORDER — ALUM & MAG HYDROXIDE-SIMETH 200-200-20 MG/5ML PO SUSP
30.0000 mL | ORAL | Status: DC | PRN
  Administered 2022-04-27 (×2): 30 mL via ORAL
  Filled 2022-04-27 (×3): qty 30

## 2022-04-27 MED ORDER — OXYCODONE HCL 5 MG PO TABS
10.0000 mg | ORAL_TABLET | Freq: Four times a day (QID) | ORAL | Status: DC | PRN
  Administered 2022-04-27 – 2022-04-28 (×4): 10 mg via ORAL
  Filled 2022-04-27 (×4): qty 2

## 2022-04-27 MED ORDER — ENSURE ENLIVE PO LIQD
237.0000 mL | Freq: Two times a day (BID) | ORAL | Status: DC
  Administered 2022-04-27 – 2022-04-30 (×3): 237 mL via ORAL

## 2022-04-27 MED ORDER — OXYCODONE HCL 5 MG PO TABS: 5.0000 mg | ORAL_TABLET | Freq: Four times a day (QID) | ORAL | Status: DC | PRN

## 2022-04-27 MED ORDER — PANTOPRAZOLE SODIUM 40 MG PO TBEC
40.0000 mg | DELAYED_RELEASE_TABLET | Freq: Two times a day (BID) | ORAL | Status: DC
  Administered 2022-04-27 – 2022-05-05 (×14): 40 mg via ORAL
  Filled 2022-04-27 (×14): qty 1

## 2022-04-27 NOTE — Progress Notes (Signed)
Rounding Note    Patient Name: Krystal [name] Date of Encounter: 04/27/2022  [place] Cardiologist: Freada Bergeron, MD   Subjective   Feeling better on steroids. HR improved, now at her normal. Pain control improving as well.  Inpatient Medications    Scheduled Meds:  acetaminophen  1,000 mg Oral TID   amLODipine  10 mg Oral Daily   Chlorhexidine Gluconate Cloth  6 each Topical Q0600   enoxaparin (LOVENOX) injection  50 mg Subcutaneous Q24H   feeding supplement  237 mL Oral BID BM   FLUoxetine  20 mg Oral Daily   gabapentin  300 mg Oral BID   hydrocortisone sod succinate (SOLU-CORTEF) inj  100 mg Intravenous Q8H   hydrocortisone sod succinate (SOLU-CORTEF) inj  50 mg Intravenous Once   irbesartan  300 mg Oral Daily   levothyroxine  150 mcg Oral Q0600   lurasidone  20 mg Oral Q2200   mometasone-formoterol  2 puff Inhalation BID   pantoprazole  40 mg Oral BID AC   polyethylene glycol  17 g Oral BID   venlafaxine XR  75 mg Oral Q breakfast   Continuous Infusions:  sodium chloride 10 mL/hr at 04/26/22 2306   PRN Meds: sodium chloride, albuterol, alum & mag hydroxide-simeth, bisacodyl, clonazePAM, hydrALAZINE, HYDROmorphone (DILAUDID) injection, melatonin, ondansetron **OR** ondansetron (ZOFRAN) IV, oxyCODONE   Vital Signs    Vitals:   04/26/22 2015 04/27/22 0006 04/27/22 0550 04/27/22 0733  BP:  (!) 159/96 (!) 163/99   Pulse:  62 (!) 53   Resp:  18 19   Temp:  97.6 F (36.4 C) 97.9 F (36.6 C)   TempSrc:  Oral Oral   SpO2: 100% 100% 99% 98%  Weight:      Height:        Intake/Output Summary (Last 24 hours) at 04/27/2022 1140 Last data filed at 04/27/2022 0555 Gross per 24 hour  Intake 240 ml  Output 1975 ml  Net -1735 ml      04/23/2022    5:50 PM 04/22/2022    8:29 AM 04/15/2022   10:29 AM  Last 3 Weights  Weight (lbs) 238 lb 238 lb 238 lb 8 oz  Weight (kg) 107.956 kg 107.956 kg 108.183 kg      Telemetry    Sinus, range  50s-120s- Personally Reviewed  ECG    No new since 04/24/22- Personally Reviewed  Physical Exam   GEN: Well nourished, well developed in no acute distress NECK: No JVD CARDIAC: regular rhythm, normal S1 and S2, no rubs or gallops. No murmur. VASCULAR: Radial pulses 2+ bilaterally.  RESPIRATORY:  Clear to auscultation without rales, wheezing or rhonchi  ABDOMEN: Soft, non-tender, non-distended MUSCULOSKELETAL:  Moves all 4 limbs independently. S/P L hand amputation SKIN: Warm and dry, no edema NEUROLOGIC:  No focal neuro deficits noted. PSYCHIATRIC:  Normal affect    Labs    High Sensitivity Troponin:  No results for input(s): "TROPONINIHS" in the last 720 hours.   Chemistry Recent Labs  Lab 04/24/22 0215 04/25/22 1102 04/26/22 0355 04/27/22 0352  NA 139 138 139 138  K 3.8 3.4* 3.2* 4.5  CL 103 104 103 100  CO2 '27 25 26 25  '$ GLUCOSE 118* 95 114* 149*  BUN 16 13 5* 8  CREATININE 1.03* 0.96 0.92 0.95  CALCIUM 9.3 8.9 8.9 9.8  MG  --  2.0  --   --   PROT 6.7  --   --   --  ALBUMIN 4.1  --   --  3.7  AST 19  --   --   --   ALT 21  --   --   --   ALKPHOS 41  --   --   --   BILITOT 0.7  --   --   --   GFRNONAA >60 >60 >60 >60  ANIONGAP '9 9 10 13    '$ Lipids No results for input(s): "CHOL", "TRIG", "HDL", "LABVLDL", "LDLCALC", "CHOLHDL" in the last 168 hours.  Hematology Recent Labs  Lab 04/24/22 0215 04/25/22 1102 04/27/22 0352  WBC 9.2 5.6 9.6  RBC 3.81* 3.86* 4.51  HGB 12.4 12.7 14.4  HCT 36.1 37.1 41.6  MCV 94.8 96.1 92.2  MCH 32.5 32.9 31.9  MCHC 34.3 34.2 34.6  RDW 12.4 12.5 11.9  PLT 262 289 337   Thyroid  Recent Labs  Lab 04/24/22 1201  TSH 9.236*  FREET4 0.71    BNPNo results for input(s): "BNP", "PROBNP" in the last 168 hours.  DDimer No results for input(s): "DDIMER" in the last 168 hours.   Radiology    No results found.  Cardiac Studies   Echocardiogram 06/17/21 1. Global longitudinal strain is -20%. Left ventricular ejection   fraction, by estimation, is 60 to 65%. The left ventricle has normal  function. The left ventricle has no regional wall motion abnormalities.  There is mild left ventricular hypertrophy.  Left ventricular diastolic parameters were normal.   2. Right ventricular systolic function is normal. The right ventricular  size is normal.   3. Mild mitral valve regurgitation.   4. The aortic valve is tricuspid. Aortic valve regurgitation is not  visualized. Aortic valve sclerosis is present, with no evidence of aortic  valve stenosis.   5. The inferior vena cava is normal in size with greater than 50%  respiratory variability, suggesting right atrial pressure of 3 mmHg.   Patient Profile     32 y.o. 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 Assessment & Plan    Bradycardia  - Patient's HR has gotten as low as 34 BPM this admission. She is hemodynamically stable, no indication for pacemaker at this time. - Holding propranolol, clonidine. Patient reports that her last dose of propranolol was the AM of 2/28. Clonidine patch was removed 2/29. Discussed with pharmacy, clonidine patch has a half life of 20 hours - Patient does have hashimoto thyroiditis- TSH 9.2 and free T4 0.71. Primary team adjusting levothyroxine  - HR has normalized now  Adrenal insufficiency -management/workup per primary team, have started stress dose steroids   Resistant HTN  -HR improving, plan to resume amlodipine, chlorthalidone, spironolactone, and valsartan gradually.  -Home dose is 320 mg valsartan; irbesartan is hospital alternative, and equivalent dose would be 300 mg. Tolerated 150 mg dose  yesterday, will increase to 300 mg dose today. Will add back amlodipine as well today. -PRN hydralazine for SBP >160   Otherwise per primary  - Hashimoto Thyroiditis  - Abdominal pain, flank pain, interstitial nephritis  - Anxiety, depression, borderline personality disorder: Per Dr. Jacolyn Reedy note "Recommend psych evaluation as patient is expressing that she wants to be DNR and is of "no use to her family with her constantly  being sick"; she has seen psych on admissions in the past and she is requesting to see them again"  For questions or updates, please contact [place] Please consult [email or web address] for contact info under     Signed, Buford Dresser, MD  04/27/2022, 11:40 AM

## 2022-04-27 NOTE — Progress Notes (Addendum)
Subjective:   Summary: Krystal [name] is a 32 y.o. year old female currently admitted on the IMTS HD#2 for bradycardia.  Overnight Events: Patient experienced more pelvic pain after foley was discontinued. Bladder scans showing 308 and then 418, foley reinserted. Had an episode of NBNB emesis. Given dose of phenergan and dilaudid overnight.  She feels better after initiation of steroids, states feels more active and not as down. Her baseline level of pain is better controlled but still experiencing waves of sharp pain intermittently that are similar in intensity to prior. She is experiencing reflux after initiation of steroids, requesting PPI. She was able to tolerate jello and pudding, but eating more substantial meal (pot roast and vegetables) last night caused for her to vomit. Discussed weaning IV pain medications as able and she is agreeable. Still experiencing some lightheadedness. She had 3 BMs yesterday.   Objective:  Vital signs in last 24 hours: Vitals:   04/26/22 2015 04/27/22 0006 04/27/22 0550 04/27/22 0733  BP:  (!) 159/96 (!) 163/99   Pulse:  62 (!) 53   Resp:  18 19   Temp:  97.6 F (36.4 C) 97.9 F (36.6 C)   TempSrc:  Oral Oral   SpO2: 100% 100% 99% 98%  Weight:      Height:       Supplemental O2: Room Air SpO2: 98 %   Physical Exam:   General: slightly uncomfortable appearing young female, laying in bed, NAD. CV: mildly bradycardic with regular rhythm, no m/r/g. Pulm: normal WOB on RA. Abdomen: soft, nondistended. Tender to palpation of suprapubic and LLQ. Normoactive bowel sounds. MSK: s/p amputation of L hand, R wrist reconstructed with healed surgical scar. Skin: warm and dry. Neuro: AAOx3, no focal deficits, moving all extremities spontaneously.   Filed Weights   04/23/22 1750  Weight: 108 kg     Intake/Output Summary (Last 24 hours) at 04/27/2022 [id number] Last data filed at 04/27/2022 0555 Gross per 24 hour  Intake 240 ml   Output 1975 ml  Net -1735 ml   Net IO Since Admission: -6,052.82 mL [04/27/22 0822]  Pertinent Labs:    Latest Ref Rng & Units 04/27/2022    3:52 AM 04/25/2022   11:02 AM 04/24/2022    2:15 AM  CBC  WBC 4.0 - 10.5 K/uL 9.6  5.6  9.2   Hemoglobin 12.0 - 15.0 g/dL 14.4  12.7  12.4   Hematocrit 36.0 - 46.0 % 41.6  37.1  36.1   Platelets 150 - 400 K/uL 337  289  262        Latest Ref Rng & Units 04/27/2022    3:52 AM 04/26/2022    3:55 AM 04/25/2022   11:02 AM  CMP  Glucose 70 - 99 mg/dL 149  114  95   BUN 6 - 20 mg/dL '8  5  13   '$ Creatinine 0.44 - 1.00 mg/dL 0.95  0.92  0.96   Sodium 135 - 145 mmol/L 138  139  138   Potassium 3.5 - 5.1 mmol/L 4.5  3.2  3.4   Chloride 98 - 111 mmol/L 100  103  104   CO2 22 - 32 mmol/L '25  26  25   '$ Calcium 8.9 - 10.3 mg/dL 9.8  8.9  8.9     Imaging: CT abdomen IMPRESSION: 1. Foci of free air in the upper abdomen, umbilicus, and low  anterior abdominal wall, likely related to history of recent surgery. No hematoma or abscess is seen. 2. Trace amount of free fluid in the cul-de-sac. 3. Hepatic steatosis. 4. Foley catheter in the urinary bladder.    EKG: Continues to be sinus bradycardic with rates in 50s-70s  Assessment/Plan:   Principal Problem:   Adrenal insufficiency (HCC) Active Problems:   Hypothyroidism due to Hashimoto's thyroiditis   Bradycardia   Abdominal pain, chronic, right lower quadrant   Post-operative pain   Primary hypertension   Patient Summary: Krystal [name] is a 32 y.o. 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    Adrenal Insufficiency Chronic abdominal pain Patient is a young Caucasian female with chronic abdominal pain, chronic intermittent diarrhea, postpartum autoimmune thyroiditis, steatosis. Her AM cortisol  was undetectable and diagnostic for adrenal insufficiency. Cosyntropin stim test with some response but blunted, confirming AI. ACTH level is pending. This is more likely secondary AI. Further workup with anti-tTG antibody pending to screen for celiac. CT abdomen this admission with normal adrenals (no atrophy). Recent brain MRI with good cross-section of pituitary and it looked normal per discussion with radiology. This is likely contributing to her bradycardia as well. Overall, having improvement after initiation of steroid therapy though continuing to have waves of severe pain so will continue IV hydrocortisone through today. Hopeful for transitioning to PO steroid therapy tomorrow. -IV hydrocortisone -Tylenol '1000mg'$  TID -oxycodone '10mg'$  q6 prn for severe pain -dilaudid '1mg'$  q6 prn for breakthrough -home gabapentin '300mg'$  BID -f/u ACTH level, anti-tTG Ab  -Miralax daily, dulcolax prn -dysphagia 3 diet, added ensure BID to help with protein supplementation while PO intake improves -will need to establish with endocrinology as outpatient  Sinus bradycardia Rates continuing to gradually improve, now staying in upper 50s to low 60s. Still feeling lightheaded at times, but hemodynamically stable. She is fully oriented. At this time, clonidine and propranolol should be worn off given last dose of propranolol 2/28 and clonidine patch removed 2/29. This may be secondary to adrenal insufficiency, hopeful rates will continue to improve with management as above. -appreciate cardiology management -hold propranolol, clonidine -telemetry -if HR fails to improve, consider EP consult -intervention with pacing and possible atropine if symptomatic bradycardia with acute BP drop or change in mentation   Urinary retention Possible interstitial cystitis Urinary retention thought to be anesthetic complication after recent lap ovarian cystectomy. She failed voiding trial, requiring reinsertion of foley catheter  04/26/2022. She has undergone extensive workup with urology and has failed multiple outpatient therapies, including intravesical local anesthetic. She was referred to Dr. Amalia Hailey at [facility] for further workup, but has not yet been seen by him. Unclear if there is a component of interstitial cystitis at play given experienced more discomfort when foley was removed. Discussed with urology, no acute hospital interventions required. Will need outpatient urology follow up, especially if needing to be discharged with foley. -maintain foley   Resistant Hypertension Given improvement in HR, irbesartan (hospital alternative to home valsartan) reinitiated yesterday at half dose. BP remains elevated in the 150s-160s/90s. Per cardiology, plan to up-titrate valsartan as tolerated and then gradually reinitiate norvasc, chlorthalidone, and spironolactone. -Appreciate cardiology management -valsartan '150mg'$  daily, up-titrate as tolerated -gradually reinitiate norvasc, chlorthalidone, spiro -IV hydralazine 10 mg as needed for SBP >160   GERD Takes pepcid at home. Likely worsened here with initiation of  steroid therapy. Starting PPI BID. -PO protonix '40mg'$  BID  Hashimoto thyroiditis Synthroid increased.  Adrenal insufficiency management as above. -Synthroid 150 mcg daily -Repeat TSH mid April 2024   Anxiety/depression Borderline personality disorder Patient reports he is currently being tapered off venlafaxine with uptitrating of her Prozac. Has significant trauma from recent medical events. Expressed concerns about mood to consulting cardiologist, including feelings of worthlessness and wish to be changed to DNR/DNI. Discussed with psychiatry via phone call, patient is noted to have borderline personality and splitting. They stated that she does not have a history of suicide attempts and believe her risk to be low. Advised to continue current medications and follow up with outpatient psychiatry. -Venlafaxine 75 mg  daily -Prozac 20 mg daily -Latuda 20 mg daily -Clonazepam 1 mg TID PRN for anxiety - f/u with outpatient psychiatry - chaplain services ordered   Asthma Respiratory status stable. No wheezing or rales on exam. Home regimen includes Symbicort and PRN Ventolin. -Resume as needed albuterol inhaler -Dulera 2 puffs twice daily  Diet:  dysphagia 3 IVF: None VTE: Enoxaparin Code: Full PT/OT recs: Pending  Dispo: Pending clinical improvement  Virl Axe, MD PGY-3 Internal Medicine Resident Please contact the on call pager after 5 pm and on weekends at [phone].

## 2022-04-28 ENCOUNTER — Inpatient Hospital Stay (HOSPITAL_COMMUNITY): Payer: Self-pay

## 2022-04-28 DIAGNOSIS — R1030 Lower abdominal pain, unspecified: Secondary | ICD-10-CM

## 2022-04-28 DIAGNOSIS — R112 Nausea with vomiting, unspecified: Secondary | ICD-10-CM

## 2022-04-28 LAB — BASIC METABOLIC PANEL
Anion gap: 7 (ref 5–15)
BUN: 14 mg/dL (ref 6–20)
CO2: 28 mmol/L (ref 22–32)
Calcium: 9.1 mg/dL (ref 8.9–10.3)
Chloride: 99 mmol/L (ref 98–111)
Creatinine, Ser: 1.02 mg/dL — ABNORMAL HIGH (ref 0.44–1.00)
GFR, Estimated: >60 mL/min (ref >60)
Glucose, Bld: 117 mg/dL — ABNORMAL HIGH (ref 70–99)
Potassium: 4.3 mmol/L (ref 3.5–5.1)
Sodium: 134 mmol/L — ABNORMAL LOW (ref 135–145)

## 2022-04-28 LAB — GLUCOSE, CAPILLARY
Glucose-Capillary: 98 mg/dL (ref 70–99)
Glucose-Capillary: 126 mg/dL — ABNORMAL HIGH (ref 70–99)
Glucose-Capillary: 163 mg/dL — ABNORMAL HIGH (ref 70–99)
Glucose-Capillary: 146 mg/dL — ABNORMAL HIGH (ref 70–99)

## 2022-04-28 MED ORDER — CHLORTHALIDONE 25 MG PO TABS
25.0000 mg | ORAL_TABLET | Freq: Every day | ORAL | Status: DC
  Administered 2022-04-28 – 2022-05-05 (×7): 25 mg via ORAL
  Filled 2022-04-28 (×8): qty 1

## 2022-04-28 MED ORDER — HYDROMORPHONE HCL 1 MG/ML IJ SOLN: 0.5000 mg | Freq: Two times a day (BID) | INTRAMUSCULAR | Status: DC | PRN

## 2022-04-28 MED ORDER — HYDROMORPHONE HCL 1 MG/ML IJ SOLN: 0.5000 mg | Freq: Four times a day (QID) | INTRAMUSCULAR | Status: DC | PRN

## 2022-04-28 MED ORDER — HYDROMORPHONE HCL 1 MG/ML IJ SOLN
1.0000 mg | Freq: Four times a day (QID) | INTRAMUSCULAR | Status: DC | PRN
  Administered 2022-04-28 – 2022-04-29 (×4): 1 mg via INTRAVENOUS
  Filled 2022-04-28 (×4): qty 1

## 2022-04-28 MED ORDER — KETOROLAC TROMETHAMINE 30 MG/ML IJ SOLN
30.0000 mg | Freq: Four times a day (QID) | INTRAMUSCULAR | Status: DC | PRN
  Administered 2022-04-29: 30 mg via INTRAVENOUS
  Filled 2022-04-28 (×2): qty 1

## 2022-04-28 MED ORDER — METHOCARBAMOL 500 MG PO TABS
750.0000 mg | ORAL_TABLET | Freq: Three times a day (TID) | ORAL | Status: DC
  Administered 2022-04-28 – 2022-04-30 (×6): 750 mg via ORAL
  Filled 2022-04-28 (×6): qty 2

## 2022-04-28 MED ORDER — KETOROLAC TROMETHAMINE 15 MG/ML IJ SOLN
15.0000 mg | Freq: Four times a day (QID) | INTRAMUSCULAR | Status: DC | PRN
  Administered 2022-04-28: 15 mg via INTRAVENOUS
  Filled 2022-04-28: qty 1

## 2022-04-28 MED ORDER — HYDROCORTISONE SOD SUC (PF) 100 MG IJ SOLR
100.0000 mg | Freq: Three times a day (TID) | INTRAMUSCULAR | Status: AC
  Administered 2022-04-28 – 2022-04-29 (×3): 100 mg via INTRAVENOUS
  Filled 2022-04-28 (×3): qty 2

## 2022-04-28 MED ORDER — SODIUM CHLORIDE 0.9 % IV SOLN
12.5000 mg | Freq: Two times a day (BID) | INTRAVENOUS | Status: DC | PRN
  Administered 2022-04-28: 12.5 mg via INTRAVENOUS
  Filled 2022-04-28: qty 12.5

## 2022-04-28 MED ORDER — LIDOCAINE 5 % EX PTCH
1.0000 | MEDICATED_PATCH | CUTANEOUS | Status: DC
  Administered 2022-04-28 – 2022-04-30 (×3): 1 via TRANSDERMAL
  Filled 2022-04-28 (×4): qty 1

## 2022-04-28 NOTE — Progress Notes (Signed)
   04/28/22 1530  Spiritual Encounters  Type of Visit Initial  Care provided to: Patient  Referral source Patient request  Reason for visit Routine spiritual support  OnCall Visit No  Spiritual Framework  Presenting Themes Meaning/purpose/sources of inspiration;Goals in life/care;Values and beliefs;Significant life change;Impactful experiences and emotions;Courage hope and growth  Patient Stress Factors Health changes;Major life changes  Interventions  Spiritual Care Interventions Made Compassionate presence;Reflective listening;Normalization of emotions;Narrative/life review;Explored values/beliefs/practices/strengths;Meaning making;Encouragement  Intervention Outcomes  Outcomes Connection to spiritual care;Awareness around self/spiritual resourses;Connection to values and goals of care;Awareness of health;Awareness of support;Reduced anxiety  Spiritual Care Plan  Spiritual Care Issues Still Outstanding Chaplain will continue to follow  Follow up plan  Chaplain Abbott Pao will provide follow up visit 04/29/2022 per patient request   Chaplain Vanetta Rule provided emotional and spiritual support. Patient is struggling with being separated from her husband and children because of her health issues. Patient requested chaplain  Kenton Kingfisher to follow up on 04/29/2022.   Note prepared by Abbott Pao, [place] Resident [phone]

## 2022-04-28 NOTE — Plan of Care (Signed)
  Problem: Education: Goal: Knowledge of General Education information will improve Description: Including pain rating scale, medication(s)/side effects and non-pharmacologic comfort measures Outcome: Progressing   Problem: Clinical Measurements: Goal: Ability to maintain clinical measurements within normal limits will improve Outcome: Progressing   Problem: Activity: Goal: Risk for activity intolerance will decrease Outcome: Progressing   Problem: Elimination: Goal: Will not experience complications related to bowel motility Outcome: Progressing Goal: Will not experience complications related to urinary retention Outcome: Progressing   Problem: Safety: Goal: Ability to remain free from injury will improve Outcome: Progressing   Problem: Skin Integrity: Goal: Risk for impaired skin integrity will decrease Outcome: Progressing   

## 2022-04-28 NOTE — Progress Notes (Signed)
10 mg PO Oxycodone given at 4:23. Pt stated feeling "a little nauseous". IV zofran administered at 04:25. Pt vomited large amount x2 immediately post aministration of IV zofran but I did not visually see oxycodone. On call attending paged & return call received from Markus Jarvis, MD. Per verbal from Markus Jarvis, MD okay to give q6h PRN IV dilaudid early at 4:41.

## 2022-04-28 NOTE — Progress Notes (Addendum)
Rounding Note    Patient Name: Krystal [name] Date of Encounter: 04/28/2022  [place] Cardiologist: Freada Bergeron, MD   Subjective   Patient appears uncomfortable in bed. Reports that she continues with significant abdominal pain and nausea. Denies dizziness/lightheadedness. No chest pain or shortness of breath. Reports rare/brief episodes of palpitations.  Inpatient Medications    Scheduled Meds:  acetaminophen  1,000 mg Oral TID   amLODipine  10 mg Oral Daily   Chlorhexidine Gluconate Cloth  6 each Topical Q0600   enoxaparin (LOVENOX) injection  50 mg Subcutaneous Q24H   feeding supplement  237 mL Oral BID BM   FLUoxetine  20 mg Oral Daily   gabapentin  300 mg Oral BID   hydrocortisone sod succinate (SOLU-CORTEF) inj  100 mg Intravenous Q8H   irbesartan  300 mg Oral Daily   levothyroxine  150 mcg Oral Q0600   lurasidone  20 mg Oral Q2200   mometasone-formoterol  2 puff Inhalation BID   pantoprazole  40 mg Oral BID AC   polyethylene glycol  17 g Oral BID   venlafaxine XR  75 mg Oral Q breakfast   Continuous Infusions:  sodium chloride 10 mL/hr at 04/26/22 2306   PRN Meds: sodium chloride, albuterol, alum & mag hydroxide-simeth, bisacodyl, clonazePAM, hydrALAZINE, HYDROmorphone (DILAUDID) injection, melatonin, ondansetron **OR** ondansetron (ZOFRAN) IV, oxyCODONE   Vital Signs    Vitals:   04/27/22 2325 04/28/22 0424 04/28/22 0733 04/28/22 0743  BP: (!) 155/99 (!) 158/113 (!) 160/103   Pulse: 64 (!) 59 60   Resp: '17 18 16   '$ Temp: 97.7 F (36.5 C) 97.9 F (36.6 C) 98.6 F (37 C)   TempSrc: Oral Oral Oral   SpO2: 97% 100% 97% 97%  Weight:      Height:        Intake/Output Summary (Last 24 hours) at 04/28/2022 0752 Last data filed at 04/28/2022 [id number] Gross per 24 hour  Intake 480 ml  Output 4300 ml  Net -3820 ml      04/23/2022    5:50 PM 04/22/2022    8:29 AM 04/15/2022   10:29 AM  Last 3 Weights  Weight (lbs) 238 lb 238 lb 238 lb 8  oz  Weight (kg) 107.956 kg 107.956 kg 108.183 kg      Telemetry    Sinus rhythm/sinus bradycardia - Personally Reviewed  ECG    No new tracing - Personally Reviewed  Physical Exam   GEN: No acute distress.   Neck: No JVD Cardiac: RRR, no murmurs, rubs, or gallops.  Respiratory: Clear to auscultation bilaterally. GI: Generally tender to palpation  MS: No edema; No deformity. Neuro:  Nonfocal  Psych: Normal affect   Labs    High Sensitivity Troponin:  No results for input(s): "TROPONINIHS" in the last 720 hours.   Chemistry Recent Labs  Lab 04/24/22 0215 04/25/22 1102 04/26/22 0355 04/27/22 0352  NA 139 138 139 138  K 3.8 3.4* 3.2* 4.5  CL 103 104 103 100  CO2 '27 25 26 25  '$ GLUCOSE 118* 95 114* 149*  BUN 16 13 5* 8  CREATININE 1.03* 0.96 0.92 0.95  CALCIUM 9.3 8.9 8.9 9.8  MG  --  2.0  --   --   PROT 6.7  --   --   --   ALBUMIN 4.1  --   --  3.7  AST 19  --   --   --   ALT 21  --   --   --  ALKPHOS 41  --   --   --   BILITOT 0.7  --   --   --   GFRNONAA >60 >60 >60 >60  ANIONGAP '9 9 10 13    '$ Lipids No results for input(s): "CHOL", "TRIG", "HDL", "LABVLDL", "LDLCALC", "CHOLHDL" in the last 168 hours.  Hematology Recent Labs  Lab 04/24/22 0215 04/25/22 1102 04/27/22 0352  WBC 9.2 5.6 9.6  RBC 3.81* 3.86* 4.51  HGB 12.4 12.7 14.4  HCT 36.1 37.1 41.6  MCV 94.8 96.1 92.2  MCH 32.5 32.9 31.9  MCHC 34.3 34.2 34.6  RDW 12.4 12.5 11.9  PLT 262 289 337   Thyroid  Recent Labs  Lab 04/24/22 1201  TSH 9.236*  FREET4 0.71    BNPNo results for input(s): "BNP", "PROBNP" in the last 168 hours.  DDimer No results for input(s): "DDIMER" in the last 168 hours.   Radiology    No results found.  Cardiac Studies   Echocardiogram 06/17/21 1. Global longitudinal strain is -20%. Left ventricular ejection  fraction, by estimation, is 60 to 65%. The left ventricle has normal  function. The left ventricle has no regional wall motion abnormalities.  There is  mild left ventricular hypertrophy.  Left ventricular diastolic parameters were normal.   2. Right ventricular systolic function is normal. The right ventricular  size is normal.   3. Mild mitral valve regurgitation.   4. The aortic valve is tricuspid. Aortic valve regurgitation is not  visualized. Aortic valve sclerosis is present, with no evidence of aortic  valve stenosis.   5. The inferior vena cava is normal in size with greater than 50%  respiratory variability, suggesting right atrial pressure of 3 mmHg.   Patient Profile     Krystal [name] is a 32 y.o. 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 Cain Sieve.   Assessment & Plan    Bradycardia  Patient noted with HR as low as 34 this admission. Recently s/p laparoscopic left ovarian cystectomy, has been having transient lightheadedness. Nodal agents and anti-hypertensive meds initially held, HR now recovered. No recurrent dizziness but given resting HR in the 50s-60s, would continue to avoid beta blockers if BP can be managed with other anti-hypertensives.  Resistant Hypertension  Patient now with recovered HR. BP remains elevated despite adding back amlodipine and full dose Irbesartan. Will plan to add Chlorthalidone '25mg'$  back today. If she tolerates, would resume Spironolactone tomorrow. Continue PRN hydralazine for systolic [id number].  Per primary team: Adrenal insufficiency: steroids per primary team Hashimoto Thyroiditis Anxiety/depression      For questions or updates, please contact [place] Please consult [email or web address] for contact info under        Signed, Lily Kocher, PA-C  04/28/2022, 7:52 AM     Personally seen and examined. Agree with APP above with the following comments:  Briefly  32 yo F with Chronic, resistant HTN, HELLP and pre-exlamsbia with severe abdominal pain  Patient notes that she has been in at least 6/10 abdominal pain this admission.  Had issues keeping oxycondone down.  Currently 10/10 abdominal pain No CP, SOB, Palpitations.  Exam notable for tachycardia; SBP 150/110 in the room.  Right hand scar with no drainage, left UE stump site c/d/i Painful abdomen  Tele: two nocturnal episodes of sinus bradycardia; then sinus tachycardia  on exam   Would recommend  - pain control; while this will likely be difficult given her pain as a driver to her BP may be able to also drive down BP - Norvasc 10 mg, CTD 25 mg today; would recommend aldactone 25 mg Po daily tomorrow - will not plan to start how propranolol; will not plan to start home clonidine unless no further options; through not review it is unclear to be me if she was symptomatic at that time   [facility] will sign off.   Medication Recommendations:  Return home spironolactone tomorrow Other recommendations (labs, testing, etc):  NA Follow up as an outpatient:  05/13/22 visit for HTN; 06/09/22 visit for cardiology   Rudean Haskell, MD [place]  [address] [place], [place] [address] [phone]  11:38 AM

## 2022-04-28 NOTE — Progress Notes (Signed)
Pt states that her PTSD  is triggered when to many people are in room.  She states " there are so many people asking me questions, I can't understand and keep up".  She also has requested for her Oxycontin be reordered as the Toradol IV med has not help her with controlling her pain. She has also requested for her dilaudid be given earlier.

## 2022-04-28 NOTE — Progress Notes (Signed)
Subjective:   Summary: Krystal [name] is a 32 y.o. year old female currently admitted on the IMTS HD#3 for bradycardia.  Overnight Events: Hypertensive, bradycardic in the 50s again, still receiving maximal pain therapy. Restarted amlodipine 10, irbesartan 300. Multiple episodes of emesis/worsening abdominal pain ON. First at [place] and again at ~0830. Both times, she possibly vomitted up oxycodone and pain was bad enough to require breakthrough dilaudid. Zofran administered as appropriate.  When assessed this morning, she mentions that she tried some solid food last night and then started having worsening abdominal pain, nausea and two episodes of emesis as above. Last BM yesterday. No blood in vomit and stools have been normal in color. Her vomit had undigested food from last night even though it was approximately 8 hours after dinner. Feels like her bladder hurts. She was previously feeling significantly better after initiating steroids.   Objective:  Vital signs in last 24 hours: Vitals:   04/28/22 0424 04/28/22 0733 04/28/22 0743 04/28/22 1245  BP: (!) 158/113 (!) 160/103  (!) 162/96  Pulse: (!) 59 60  64  Resp: '18 16  18  '$ Temp: 97.9 F (36.6 C) 98.6 F (37 C)  98.2 F (36.8 C)  TempSrc: Oral Oral  Oral  SpO2: 100% 97% 97% 97%  Weight:      Height:       Supplemental O2: Room Air SpO2: 97 %   Physical Exam:   General: uncomfortable appearing young female, laying in bed CV: RRR, no m/r/g. Pulm: normal WOB on RA. Abdomen: soft, nondistended. Tender to palpation of suprapubic and LLQ. Hypoactive bowel sounds. MSK: s/p amputation of L hand, R wrist reconstructed with healed surgical scar. Skin: warm and dry. Neuro: AAOx3, no focal deficits, moving all extremities spontaneously.   Filed Weights   04/23/22 1750  Weight: 108 kg     Intake/Output Summary (Last 24 hours) at 04/28/2022 1324 Last data filed at 04/28/2022 1300 Gross per 24 hour  Intake  480 ml  Output 4800 ml  Net -4320 ml   Net IO Since Admission: -10,372.82 mL [04/28/22 1324]  Pertinent Labs:    Latest Ref Rng & Units 04/27/2022    3:52 AM 04/25/2022   11:02 AM 04/24/2022    2:15 AM  CBC  WBC 4.0 - 10.5 K/uL 9.6  5.6  9.2   Hemoglobin 12.0 - 15.0 g/dL 14.4  12.7  12.4   Hematocrit 36.0 - 46.0 % 41.6  37.1  36.1   Platelets 150 - 400 K/uL 337  289  262        Latest Ref Rng & Units 04/28/2022    9:36 AM 04/27/2022    3:52 AM 04/26/2022    3:55 AM  CMP  Glucose 70 - 99 mg/dL 117  149  114   BUN 6 - 20 mg/dL '14  8  5   '$ Creatinine 0.44 - 1.00 mg/dL 1.02  0.95  0.92   Sodium 135 - 145 mmol/L 134  138  139   Potassium 3.5 - 5.1 mmol/L 4.3  4.5  3.2   Chloride 98 - 111 mmol/L 99  100  103   CO2 22 - 32 mmol/L '28  25  26   '$ Calcium 8.9 - 10.3 mg/dL 9.1  9.8  8.9     Imaging: CT abdomen IMPRESSION: 1. Foci of free air in the upper abdomen, umbilicus, and low  anterior abdominal wall, likely related to history of recent surgery. No hematoma or abscess is seen. 2. Trace amount of free fluid in the cul-de-sac. 3. Hepatic steatosis. 4. Foley catheter in the urinary bladder.     EKG: NSR rates 60-80 this morning  Assessment/Plan:   Principal Problem:   Adrenal insufficiency (HCC) Active Problems:   Hypothyroidism due to Hashimoto's thyroiditis   Bradycardia   Abdominal pain, chronic, right lower quadrant   Post-operative pain   Primary hypertension   Patient Summary: Krystal [name] is a 32 y.o. 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   Adrenal Insufficiency Chronic abdominal pain Nausea/vomiting Confirmed adrenal insufficiency with ACTH pending.  Her symptoms were initially significantly better after initiation of stress dose steroids, but she has been  having worse abdominal pain overnight and 2 episodes of emesis after being soft foods yesterday.  Celiac autoantibodies were negative, but wonder if she does have some level of food intolerance with family history and strong clinical suspicion for enteropathy.  Her current pain regimen is also having minimal relief.  Overall, suspect that she has multifactorial abdominal pain due to some combination of adrenal insufficiency, possible interstitial cystitis as below, suspected enteropathy, and some chronic functional pain as well.  Will modify pain regimen and antiemetics after obtaining EKG to double check QTc.  Will also obtain imaging of her abdomen due to hypoactive bowel sounds and worsening belly pain, though low suspicion for acute SBO given she has been having bowel movements and describes this is similar to her chronic pain.  For now, we will try to maintain IV formulation of meds given persistent vomiting. -Follow-up KUB - EKG to check QTc, then consider switching Zofran to promethazine, which she says works better for her nausea.  Of note, she has documented reactions to Reglan and Compazine. -Continue IV hydrocortisone -Tylenol '1000mg'$  TID, Toradol 15 every 6 hours as needed, Dilaudid 1 mg every 6 hours as needed for breakthrough -home gabapentin '300mg'$  BID -f/u ACTH level -Miralax daily, dulcolax prn -dysphagia 3 diet, Ensure, gluten-free diet added today -will need to establish with endocrinology as outpatient - Outpatient GI follow-up for possible duodenal biopsy and workup of food intolerance/gastroparesis  Sinus bradycardia Rates continue to improve, remains NSR this morning. More reactive with pain/discomfort compared to prior. -appreciate cardiology management -hold propranolol, clonidine -telemetry -if HR fails to improve, consider EP consult -intervention with pacing and possible atropine if symptomatic bradycardia with acute BP drop or change in mentation   Urinary  retention Possible interstitial cystitis Urinary retention thought to be anesthetic complication after recent lap ovarian cystectomy. She failed voiding trial, requiring reinsertion of foley catheter 04/26/2022. She has undergone extensive workup with urology and has failed multiple outpatient therapies, including intravesical local anesthetic. She was referred to Dr. Amalia Hailey at [facility] for further workup, but has not yet been seen by him. Unclear if there is a component of interstitial cystitis at play given experienced more discomfort when foley was removed. Discussed with urology, no acute hospital interventions required. Will need outpatient urology follow up, especially if needing to be discharged with foley. -maintain foley   Resistant Hypertension Now on amlodipine 10, irbesartan 300, still hypertensive. Cardiology adding chlorthalidone 25 today, possibly spironolactone down the line -Appreciate cardiology management -holding clonidine, propanolol  -IV hydralazine 10 mg as needed for SBP >160  Hyponatremia Mild hyponatremia today, need to watch in setting of restarting chlorthalidone. Also of note, she has diuresed almost 4.5L, if Na continues to drop, need to consider salt wasting, especially in setting of adrenal insufficiency  GERD Takes pepcid at home. Likely worsened here with initiation of steroid therapy. Starting PPI BID. Watch for worsening symptoms after starting NSAIDs -PO protonix '40mg'$  BID  Hashimoto thyroiditis Synthroid increased.  Adrenal insufficiency management as above. -Synthroid 150 mcg daily -Repeat TSH mid April 2024   Anxiety/depression Borderline personality disorder Patient reports he is currently being tapered off venlafaxine with uptitrating of her Prozac. Has significant trauma from recent medical events. Expressed concerns about mood to consulting cardiologist, including feelings of worthlessness and wish to be changed to DNR/DNI. Discussed with psychiatry via  phone call, patient is noted to have borderline personality and splitting. They stated that she does not have a history of suicide attempts and believe her risk to be low. Advised to continue current medications and follow up with outpatient psychiatry. -Venlafaxine 75 mg daily -Prozac 20 mg daily -Latuda 20 mg daily -Clonazepam 1 mg TID PRN for anxiety - f/u with outpatient psychiatry - chaplain services ordered   Asthma Respiratory status stable. No wheezing or rales on exam. Home regimen includes Symbicort and PRN Ventolin. -Resume as needed albuterol inhaler -Dulera 2 puffs twice daily  Diet:  dysphagia 3 IVF: None VTE: Enoxaparin Code: Full PT/OT recs: Pending  Dispo: Pending clinical improvement  Virl Axe, MD PGY-3 Internal Medicine Resident Please contact the on call pager after 5 pm and on weekends at [phone].

## 2022-04-29 ENCOUNTER — Inpatient Hospital Stay (HOSPITAL_COMMUNITY): Payer: Self-pay

## 2022-04-29 DIAGNOSIS — K529 Noninfective gastroenteritis and colitis, unspecified: Secondary | ICD-10-CM | POA: Insufficient documentation

## 2022-04-29 DIAGNOSIS — R1030 Lower abdominal pain, unspecified: Secondary | ICD-10-CM

## 2022-04-29 DIAGNOSIS — R112 Nausea with vomiting, unspecified: Secondary | ICD-10-CM

## 2022-04-29 LAB — CULTURE, BLOOD (ROUTINE X 2)
Culture: NO GROWTH
Culture: NO GROWTH
Special Requests: ADEQUATE

## 2022-04-29 LAB — RENAL FUNCTION PANEL
Albumin: 3.5 g/dL (ref 3.5–5.0)
Anion gap: 8 (ref 5–15)
BUN: 12 mg/dL (ref 6–20)
CO2: 30 mmol/L (ref 22–32)
Calcium: 9.4 mg/dL (ref 8.9–10.3)
Chloride: 98 mmol/L (ref 98–111)
Creatinine, Ser: 1.01 mg/dL — ABNORMAL HIGH (ref 0.44–1.00)
GFR, Estimated: >60 mL/min (ref >60)
Glucose, Bld: 113 mg/dL — ABNORMAL HIGH (ref 70–99)
Phosphorus: 4.5 mg/dL (ref 2.5–4.6)
Potassium: 3.8 mmol/L (ref 3.5–5.1)
Sodium: 136 mmol/L (ref 135–145)

## 2022-04-29 LAB — GLUCOSE, CAPILLARY
Glucose-Capillary: 118 mg/dL — ABNORMAL HIGH (ref 70–99)
Glucose-Capillary: 107 mg/dL — ABNORMAL HIGH (ref 70–99)
Glucose-Capillary: 86 mg/dL (ref 70–99)
Glucose-Capillary: 97 mg/dL (ref 70–99)

## 2022-04-29 LAB — SEDIMENTATION RATE: Sed Rate: 4 mm/hr (ref 0–22)

## 2022-04-29 LAB — ACTH: C206 ACTH: <1.5 pg/mL — ABNORMAL LOW (ref 7.2–63.3)

## 2022-04-29 LAB — C-REACTIVE PROTEIN: CRP: <0.5 mg/dL (ref <1.0)

## 2022-04-29 MED ORDER — GABAPENTIN 300 MG PO CAPS
300.0000 mg | ORAL_CAPSULE | Freq: Three times a day (TID) | ORAL | Status: DC
  Administered 2022-04-29 – 2022-05-01 (×7): 300 mg via ORAL
  Filled 2022-04-29 (×8): qty 1

## 2022-04-29 MED ORDER — COLESTIPOL HCL 1 G PO TABS
1.0000 g | ORAL_TABLET | Freq: Two times a day (BID) | ORAL | Status: DC
  Administered 2022-04-29 – 2022-04-30 (×2): 1 g via ORAL
  Filled 2022-04-29 (×3): qty 1

## 2022-04-29 MED ORDER — LORAZEPAM 2 MG/ML IJ SOLN
1.0000 mg | Freq: Once | INTRAMUSCULAR | Status: AC | PRN
  Administered 2022-04-29: 1 mg via INTRAVENOUS
  Filled 2022-04-29: qty 1

## 2022-04-29 MED ORDER — GADOBUTROL 1 MMOL/ML IV SOLN
10.0000 mL | Freq: Once | INTRAVENOUS | Status: AC | PRN
  Administered 2022-04-29: 10 mL via INTRAVENOUS

## 2022-04-29 MED ORDER — SODIUM CHLORIDE 0.9 % IV SOLN
12.5000 mg | Freq: Four times a day (QID) | INTRAVENOUS | Status: DC | PRN
  Administered 2022-04-29 – 2022-05-01 (×8): 12.5 mg via INTRAVENOUS
  Filled 2022-04-29: qty 12.5
  Filled 2022-04-29 (×3): qty 0.5
  Filled 2022-04-29: qty 12.5
  Filled 2022-04-29 (×4): qty 0.5

## 2022-04-29 MED ORDER — OXYCODONE HCL 5 MG PO TABS
5.0000 mg | ORAL_TABLET | Freq: Four times a day (QID) | ORAL | Status: DC | PRN
  Administered 2022-04-29 – 2022-04-30 (×4): 5 mg via ORAL
  Filled 2022-04-29 (×4): qty 1

## 2022-04-29 MED ORDER — HYDROMORPHONE HCL 1 MG/ML IJ SOLN
1.0000 mg | Freq: Three times a day (TID) | INTRAMUSCULAR | Status: DC | PRN
  Administered 2022-04-29 – 2022-04-30 (×3): 1 mg via INTRAVENOUS
  Filled 2022-04-29 (×3): qty 1

## 2022-04-29 NOTE — Consult Note (Addendum)
Consultation Note   Referring Provider:  Teaching Service PCP: Patient, No Pcp Per Primary Gastroenterologist: Althia Forts. Has seen [facility] as inpatient only. No longer has [facility]        Reason for consultation: Abdominal pain. Teaching Service concerned about some sort of small bowel enteropathy   Hospital Day: 7  ASSESSMENT   # 32 yo female admitted with acute on chronic abdominal pain. She tells me the pain is bladder related.     - Unrevealing CT scan - She is s/p left ovarian cystectomy and LOA 04/22/22, GYN has seen her inpatient and pain is not related to recent surgery or GYN organs.  - She gives a history of interstitial cystitis. Admitting team spoke with Urology on 3/1 who said  they had exhausted all treatment options. Nothing has been of benefit including.gabapentin, oxybutynin, several other medications, and also intravesical local anesthetics..    # Reported Interstitial cystitis. / urinary retention since GYN surgery.  Has foley. Has had extensive workup it sounds.  To see Dr. Amalia Hailey at [facility] soon.  # Adrenal insufficiency Getting IV hydrocortisone  # Sinus bradycardia.  Beta blocker and Clonidine on hold.   # GERD, takes BID pepcid at home but has breakthrough reflux symptoms at night  # Frequent loose stool. Probably multifactorial ( functional, possibly bile salt acid related, possible food intolerances).  - Unclear why she is getting BID Miralax if she has frequent loose stools.  - Admitting team queries autoimmune enteropathy. Celiac does cause her diarrhea  and joint pains but she had a normal appearing duodenum on EGD in 2022 ( though no biopsies were done), she hasn't had any weight loss to suggest malabsorption. - No bowel wall inflammation on CT scan to suggest IBD.  - She does mention that diarrhea got worse after cholecystectomy but it isn't necessarily meal triggered.   # Lactose intolerance.  Diary  causes vomiting and diarrhea   # Hypothyroidism.  -On Synthroid -TSH elevated at 9.2.   # Anxiety / depression / hx of borderline personality disorder.  - Sees Psychiatry outpatient.   # Multiple medication allergies  PLAN -Await celiac serologies ordered by admitting team - ESR,  CRP -It is reasonable to try cholestyramine once daily since she does correlate the onset of frequent loose stool with cholecystectomy. However, if having loose BMs would first stop the miralax  -At some point can consider outpatient colonoscopy with random colon biopsies though microscopic colitis is not common in her age group    HPI:  Patient is a 32 y.o. year old female with a past medical history of depression, anxiety, PTSD, HELLP syndrome ( both pregnancies), chronic intermittent abdominal pain, chronic intermittent nausea / vomiting, gastritis, cholecystectomy, asthma, HTN, hypothyroidism, interstitial cystitis, hemorrhagic ovarian cyst s/p left ovarian cystectomy and LOA 04/22/22, Left hand necrotizing fasciitis s/p amputation, adrenal insufficiency   See PMH for any additional medical problems.  Krystal [name] had a left ovarian cystectomy the end of February. She.came to ED   2/29 unable to urinate and with lower abdominal pain / back pain.  Bladder scan  with > 500 ml urine. Foley cath placed. In ED she was bradycardic with HR in the 30's. Cardiology evaluated and held home  BP meds.   CT ABDOMEN PELVIS W CONTRAST IMPRESSION: 1. Foci of free air in the upper abdomen, umbilicus, and low anterior abdominal wall, likely related to history of recent surgery. No hematoma or abscess is seen. 2. Trace amount of free fluid in the cul-de-sac. 3. Hepatic steatosis. 4. Foley catheter in the urinary bladder   Previous GI History / Evaluation :   March 2022 EGD for upper abdominal pain and nausea.  - Normal esophagus. Gastritis, normal duodenum  Path: mild reactive gastropathy. No H.pylori   Recent Imaging and  Labs: DG Abd 1 View  Result Date: 04/28/2022 CLINICAL DATA:  Abdominal pain EXAM: ABDOMEN - 1 VIEW COMPARISON:  None Available. FINDINGS: Bowel gas pattern is nonspecific. No abnormal masses or calcifications are seen. Surgical clips are seen in gallbladder fossa. IMPRESSION: No radiographic abnormalities are seen in abdomen. Electronically Signed   By: Elmer Picker M.D.   On: 04/28/2022 12:36   CT ABDOMEN PELVIS W CONTRAST  Result Date: 04/24/2022 CLINICAL DATA:  Postop abdominal pain. Procedure to remove cyst on left ovary yesterday. Difficulty urinating. EXAM: CT ABDOMEN AND PELVIS WITH CONTRAST TECHNIQUE: Multidetector CT imaging of the abdomen and pelvis was performed using the standard protocol following bolus administration of intravenous contrast. RADIATION DOSE REDUCTION: This exam was performed according to the departmental dose-optimization program which includes automated exposure control, adjustment of the mA and/or kV according to patient size and/or use of iterative reconstruction technique. CONTRAST:  128m OMNIPAQUE IOHEXOL 350 MG/ML SOLN COMPARISON:  04/12/2022. FINDINGS: Lower chest: Mild emphysematous changes are present in the lungs. There is atelectasis at the lung bases. Hepatobiliary: No focal liver abnormality is seen. Mild fatty infiltration of the liver is noted. Status post cholecystectomy. No biliary dilatation. Pancreas: Unremarkable. No pancreatic ductal dilatation or surrounding inflammatory changes. Spleen: Normal in size without focal abnormality. Adrenals/Urinary Tract: The adrenal glands are within normal limits. The kidneys enhance symmetrically. No renal calculus or hydronephrosis. A Foley catheter is present in the urinary bladder in the bladder is decompressed. Stomach/Bowel: Stomach is within normal limits. Appendix is not seen. No evidence of bowel wall thickening, distention, or inflammatory changes. Vascular/Lymphatic: No significant vascular findings are  present. No enlarged abdominal or pelvic lymph nodes. Reproductive: The uterus is in-situ.  No adnexal mass. Other: A small amount of free fluid is present in the cul-de-sac. Trace amount of free air is noted in the upper abdomen and at the umbilicus in low anterior abdominal wall subcutaneous tissues, likely related to recent surgery. No abscess or hematoma is seen. Musculoskeletal: No acute osseous abnormality. IMPRESSION: 1. Foci of free air in the upper abdomen, umbilicus, and low anterior abdominal wall, likely related to history of recent surgery. No hematoma or abscess is seen. 2. Trace amount of free fluid in the cul-de-sac. 3. Hepatic steatosis. 4. Foley catheter in the urinary bladder. Electronically Signed   By: LBrett FairyM.D.   On: 04/24/2022 04:37   [place]elvis Complete  Result Date: 04/16/2022 CLINICAL DATA:  Pelvic pain for 2 weeks. History of left ovarian cyst rule out torsion. EXAM: TRANSABDOMINAL AND TRANSVAGINAL ULTRASOUND OF PELVIS DOPPLER ULTRASOUND OF OVARIES TECHNIQUE: Both transabdominal and transvaginal ultrasound examinations of the pelvis were performed. Transabdominal technique was performed for global imaging of the pelvis including uterus, ovaries, adnexal regions, and pelvic cul-de-sac. It was necessary to proceed with endovaginal exam following the transabdominal exam to visualize the ovaries and endometrium. Color and duplex Doppler ultrasound was utilized to evaluate blood flow  to the ovaries. COMPARISON:  04/13/2022 FINDINGS: Uterus Measurements: 7.6 x 3.7 x 5.9 cm = volume: 87 mL. Uterus is anteverted. No myometrial mass lesions. Endometrium Thickness: 9 mm.  No focal abnormality visualized. Right ovary Measurements: 2.6 x 1.4 x 1.9 cm = volume: 4 mL. Normal appearance/no adnexal mass. Left ovary Measurements: 6.7 x 4.7 x 5.2 cm = volume: 87 mL. Left ovarian cyst is again demonstrated, measuring 4.9 x 6 x 4.6 cm. Asymmetric nodular area along the cyst wall. No flow is  demonstrated suggesting probably layering debris or internal hemorrhage. Appearance is unchanged since prior study. Size measurements are similar. Pulsed Doppler evaluation of both ovaries demonstrates normal low-resistance arterial and venous waveforms. Other findings Minimal free fluid seen in the left adnexal region. IMPRESSION: Again demonstrated is a 6 cm max diameter left ovarian cyst with internal debris. This is unchanged in appearance since prior study, probably representing a hemorrhagic cyst. As previously recommended, ultrasound in 6 weeks is suggested to ensure resolution. No evidence of ovarian torsion. Electronically Signed   By: Lucienne Capers M.D.   On: 04/16/2022 19:11   US Transvaginal Non-OB  Result Date: 04/16/2022 CLINICAL DATA:  Pelvic pain for 2 weeks. History of left ovarian cyst rule out torsion. EXAM: TRANSABDOMINAL AND TRANSVAGINAL ULTRASOUND OF PELVIS DOPPLER ULTRASOUND OF OVARIES TECHNIQUE: Both transabdominal and transvaginal ultrasound examinations of the pelvis were performed. Transabdominal technique was performed for global imaging of the pelvis including uterus, ovaries, adnexal regions, and pelvic cul-de-sac. It was necessary to proceed with endovaginal exam following the transabdominal exam to visualize the ovaries and endometrium. Color and duplex Doppler ultrasound was utilized to evaluate blood flow to the ovaries. COMPARISON:  04/13/2022 FINDINGS: Uterus Measurements: 7.6 x 3.7 x 5.9 cm = volume: 87 mL. Uterus is anteverted. No myometrial mass lesions. Endometrium Thickness: 9 mm.  No focal abnormality visualized. Right ovary Measurements: 2.6 x 1.4 x 1.9 cm = volume: 4 mL. Normal appearance/no adnexal mass. Left ovary Measurements: 6.7 x 4.7 x 5.2 cm = volume: 87 mL. Left ovarian cyst is again demonstrated, measuring 4.9 x 6 x 4.6 cm. Asymmetric nodular area along the cyst wall. No flow is demonstrated suggesting probably layering debris or internal hemorrhage.  Appearance is unchanged since prior study. Size measurements are similar. Pulsed Doppler evaluation of both ovaries demonstrates normal low-resistance arterial and venous waveforms. Other findings Minimal free fluid seen in the left adnexal region. IMPRESSION: Again demonstrated is a 6 cm max diameter left ovarian cyst with internal debris. This is unchanged in appearance since prior study, probably representing a hemorrhagic cyst. As previously recommended, ultrasound in 6 weeks is suggested to ensure resolution. No evidence of ovarian torsion. Electronically Signed   By: Lucienne Capers M.D.   On: 04/16/2022 19:11   [place] Art/Ven Flow Abd Pelv Doppler  Result Date: 04/16/2022 CLINICAL DATA:  Pelvic pain for 2 weeks. History of left ovarian cyst rule out torsion. EXAM: TRANSABDOMINAL AND TRANSVAGINAL ULTRASOUND OF PELVIS DOPPLER ULTRASOUND OF OVARIES TECHNIQUE: Both transabdominal and transvaginal ultrasound examinations of the pelvis were performed. Transabdominal technique was performed for global imaging of the pelvis including uterus, ovaries, adnexal regions, and pelvic cul-de-sac. It was necessary to proceed with endovaginal exam following the transabdominal exam to visualize the ovaries and endometrium. Color and duplex Doppler ultrasound was utilized to evaluate blood flow to the ovaries. COMPARISON:  04/13/2022 FINDINGS: Uterus Measurements: 7.6 x 3.7 x 5.9 cm = volume: 87 mL. Uterus is anteverted. No myometrial mass lesions. Endometrium  Thickness: 9 mm.  No focal abnormality visualized. Right ovary Measurements: 2.6 x 1.4 x 1.9 cm = volume: 4 mL. Normal appearance/no adnexal mass. Left ovary Measurements: 6.7 x 4.7 x 5.2 cm = volume: 87 mL. Left ovarian cyst is again demonstrated, measuring 4.9 x 6 x 4.6 cm. Asymmetric nodular area along the cyst wall. No flow is demonstrated suggesting probably layering debris or internal hemorrhage. Appearance is unchanged since prior study. Size measurements are  similar. Pulsed Doppler evaluation of both ovaries demonstrates normal low-resistance arterial and venous waveforms. Other findings Minimal free fluid seen in the left adnexal region. IMPRESSION: Again demonstrated is a 6 cm max diameter left ovarian cyst with internal debris. This is unchanged in appearance since prior study, probably representing a hemorrhagic cyst. As previously recommended, ultrasound in 6 weeks is suggested to ensure resolution. No evidence of ovarian torsion. Electronically Signed   By: Lucienne Capers M.D.   On: 04/16/2022 19:11   US PELVIC COMPLETE W TRANSVAGINAL AND TORSION R/O  Result Date: 04/13/2022 CLINICAL DATA:  Pelvic pain EXAM: TRANSABDOMINAL AND TRANSVAGINAL ULTRASOUND OF PELVIS DOPPLER ULTRASOUND OF OVARIES TECHNIQUE: Both transabdominal and transvaginal ultrasound examinations of the pelvis were performed. Transabdominal technique was performed for global imaging of the pelvis including uterus, ovaries, adnexal regions, and pelvic cul-de-sac. It was necessary to proceed with endovaginal exam following the transabdominal exam to visualize the ovaries. Color and duplex Doppler ultrasound was utilized to evaluate blood flow to the ovaries. COMPARISON:  Ultrasound from the previous day. FINDINGS: Uterus Measurements: 7.5 x 4.0 x 5.1 cm. = volume: 80 mL. No fibroids or other mass visualized. Endometrium Thickness: 4.5 mm. Minimal fluid is noted within the endometrial canal. Right ovary Not visualized similar to that seen on the previous day. Left ovary Measurements: 7.1 x 5.4 x 5.3 cm. = volume: 106 mL. 5.9 cm predominately hypoechoic cyst is noted within the left ovary similar to that seen on the prior exam with some layering debris. This is again most consistent with a hemorrhagic cyst and stable from the previous day. Pulsed Doppler evaluation of left ovary demonstrates normal low-resistance arterial and venous waveforms. Other findings No abnormal free fluid. IMPRESSION:  Stable cystic lesion within the left ovary likely representing a hemorrhagic cyst. No findings to suggest ovarian torsion are noted. Follow-up continues as previously described. Electronically Signed   By: Inez Catalina M.D.   On: 04/13/2022 21:39   US PELVIC COMPLETE W TRANSVAGINAL AND TORSION R/O  Result Date: 04/12/2022 CLINICAL DATA:  Severe left lower quadrant pain with vaginal bleeding for 1-1/2 weeks, ovarian cyst identified by CT EXAM: TRANSABDOMINAL AND TRANSVAGINAL ULTRASOUND OF PELVIS DOPPLER ULTRASOUND OF OVARIES TECHNIQUE: Both transabdominal and transvaginal ultrasound examinations of the pelvis were performed. Transabdominal technique was performed for global imaging of the pelvis including uterus, ovaries, adnexal regions, and pelvic cul-de-sac. It was necessary to proceed with endovaginal exam following the transabdominal exam to visualize the uterus, ovaries, endometrium, and adnexa. Color and duplex Doppler ultrasound was utilized to evaluate blood flow to the ovaries. COMPARISON:  Same-day CT abdomen pelvis FINDINGS: Uterus Measurements: 7.7 x 3.4 x 4.8 cm = volume: 66 mL. No fibroids or other mass visualized. Endometrium Thickness: 0.6 cm.  No focal abnormality visualized. Right ovary Not visualized. Left ovary Measurements: 6.3 x 6.2 x 5.8 cm = volume: 117 mL. The ovary is enlarged by a hypoechoic cyst with some layering internal debris measuring 6.2 x 5.3 x 5.4 cm. Pulsed Doppler evaluation of the left ovary  demonstrates normal low-resistance arterial and venous waveforms. Other findings Trace free fluid in the low pelvis. IMPRESSION: 1. The left ovary is enlarged by a hypoechoic cyst with some layering internal debris measuring 6.2 x 5.3 x 5.4 cm. Findings are most consistent with a hemorrhagic cyst. Recommend follow-up ultrasound in 6 weeks to ensure resolution. 2. Right ovary is not visualized by ultrasound. 3. Trace free fluid in the low pelvis. 4. Normal arterial and venous Doppler  flow is present to the left ovary. Electronically Signed   By: Delanna Ahmadi M.D.   On: 04/12/2022 16:51   CT Renal Stone Study  Result Date: 04/12/2022 CLINICAL DATA:  Left lower quadrant/flank pain for 3 days. Nausea and vomiting. EXAM: CT ABDOMEN AND PELVIS WITHOUT CONTRAST TECHNIQUE: Multidetector CT imaging of the abdomen and pelvis was performed following the standard protocol without IV contrast. RADIATION DOSE REDUCTION: This exam was performed according to the departmental dose-optimization program which includes automated exposure control, adjustment of the mA and/or kV according to patient size and/or use of iterative reconstruction technique. COMPARISON:  CT scan 03/02/2022 FINDINGS: Lower chest: The lung bases are clear of acute process. No pleural effusion or pulmonary lesions. The heart is normal in size. No pericardial effusion. The distal esophagus and aorta are unremarkable. Hepatobiliary: No hepatic lesions or intrahepatic biliary dilatation. The gallbladder is surgically absent. No common bile duct dilatation. Pancreas: No mass, inflammation or ductal dilatation. Spleen: Very irregular nodular appearing spleen appears stable. Could be related to prior trauma. No lesions. Adrenals/Urinary Tract: The adrenal glands are normal. No renal, ureteral or bladder calculi. No worrisome renal or bladder lesions without contrast. Stomach/Bowel: The stomach, duodenum, small bowel and colon are unremarkable. No acute inflammatory process, mass lesions or obstructive findings. The appendix is surgically absent. Vascular/Lymphatic: The aorta is normal in caliber. No atheroscerlotic calcifications. No mesenteric of retroperitoneal mass or adenopathy. Small scattered lymph nodes are noted. Reproductive: The uterus is unremarkable. There is a new 6 x 5.7 x 5.6 cm cyst associated with the left ovary. It measures 20 Hounsfield units. The fact that it was not present in January of this year would suggest a benign  process. Recommend follow-up pelvic ultrasound examination in 3-6 months. Note: This recommendation does not apply to premenarchal patients and to those with increased risk (genetic, family history, elevated tumor markers or other high-risk factors) of ovarian cancer. Reference: JACR 2020 Feb; 17(2):248-254 Other: No pelvic mass or adenopathy. No free pelvic fluid collections. No inguinal mass or adenopathy. No abdominal wall hernia or subcutaneous lesions. Musculoskeletal: No significant bony findings. IMPRESSION: 1. No acute abdominal/pelvic findings, mass lesions or adenopathy. 2. No renal, ureteral or bladder calculi or mass. 3. New 6 x 5.7 x 5.6 cm left ovarian cyst. Recommend follow-up pelvic ultrasound examination in 3-6 months. 4. Status post cholecystectomy. No biliary dilatation. Electronically Signed   By: Marijo Sanes M.D.   On: 04/12/2022 14:55      Labs:  Recent Labs    04/27/22 0352  WBC 9.6  HGB 14.4  HCT 41.6  PLT 337   Recent Labs    04/27/22 0352 04/28/22 0936 04/29/22 0238  NA 138 134* 136  K 4.5 4.3 3.8  CL 100 99 98  CO2 '25 28 30  '$ GLUCOSE 149* 117* 113*  BUN '8 14 12  '$ CREATININE 0.95 1.02* 1.01*  CALCIUM 9.8 9.1 9.4   Recent Labs    04/29/22 0238  ALBUMIN 3.5   No results for input(s): "HEPBSAG", "HCVAB", "  HEPAIGM", "HEPBIGM" in the last 72 hours. No results for input(s): "LABPROT", "INR" in the last 72 hours.  Past Medical History:  Diagnosis Date   Acute acalculous cholecystitis s/p lap cholecystectomy 05/11/2020 05/11/2020   Angio-edema 01/15/2021   Anxiety    Asthma    Depression    HELLP (hemolytic anemia/elev liver enzymes/low platelets in pregnancy) 12/08/2017   History of borderline personality disorder    History of HELLP syndrome, currently pregnant 04/21/2019   Formatting of this note might be different from the original.  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   Severe uncontrolled hypertension 10/08/2017   UTI (urinary tract infection)     Past Surgical History:  Procedure Laterality Date   APPENDECTOMY     BIOPSY  05/10/2020   Procedure: BIOPSY;  Surgeon: Arta Silence, MD;  Location: [facility];  Service: Endoscopy;;   CESAREAN SECTION N/A 12/06/2017   Procedure: CESAREAN SECTION;  Surgeon: Sloan Leiter, MD;  Location: [place];  Service: Obstetrics;  Laterality: N/A;   CESAREAN SECTION  07/21/2021   Procedure: CESAREAN SECTION;  Surgeon: Donnamae Jude, MD;  Location: [facility];  Service: Obstetrics;;   CHOLECYSTECTOMY N/A 05/11/2020   Procedure: SINGLE SITE LAPAROSCOPIC CHOLECYSTECTOMY AND LIVER BIOSY;  Surgeon: Michael Boston, MD;  Location: [facility];  Service: General;  Laterality: N/A;   ESOPHAGOGASTRODUODENOSCOPY (EGD) WITH PROPOFOL N/A 05/10/2020   Procedure: ESOPHAGOGASTRODUODENOSCOPY (EGD) WITH PROPOFOL;  Surgeon: Arta Silence, MD;  Location: [facility];  Service: Endoscopy;  Laterality: N/A;   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   HAND RECONSTRUCTION Right    INCISION AND DRAINAGE     LAPAROSCOPIC LYSIS OF ADHESIONS  04/22/2022   Procedure: LAPAROSCOPIC LYSIS OF ADHESIONS;  Surgeon: Aletha Halim, MD;  Location: [place];  Service: Gynecology;;   LAPAROSCOPIC OVARIAN CYSTECTOMY Left 04/22/2022   Procedure: LAPAROSCOPIC LEFT OVARIAN CYSTECTOMY;  Surgeon: Aletha Halim, MD;  Location: [place];  Service: Gynecology;  Laterality: Left;   NASAL SEPTUM SURGERY      Family History  Problem Relation  Age of Onset   Hypertension Mother    Coronary artery disease Mother    Hypertension Father    Stroke Father    Atrial fibrillation Father    Heart failure Maternal Grandfather    Heart attack Maternal Grandfather     Prior to Admission medications   Medication Sig Start Date End Date Taking? Authorizing Provider  acetaminophen (TYLENOL) 500 MG tablet Take 1,000 mg by mouth daily as needed for moderate pain, headache or fever.   Yes [provider]  albuterol (VENTOLIN HFA) 108 (90 Base) MCG/ACT inhaler INHALE 2 PUFFS INTO THE LUNGS EVERY 4 HOURS AS NEEDED FOR WHEEZING OR SHORTNESS OR BREATH Patient taking differently: Inhale 2 puffs into the lungs every 6 (six) hours as needed for wheezing or shortness of breath. 04/14/22  Yes Garnet Sierras, DO  cetirizine (  ZYRTEC ALLERGY) 10 MG tablet Take 1 tablet (10 mg total) by mouth 2 (two) times daily. Patient taking differently: Take 10 mg by mouth See admin instructions. Take 10 mg daily, may take a second 10 mg dose as needed for allergies 03/26/21  Yes Garnet Sierras, DO  clonazePAM (KLONOPIN) 1 MG tablet Take 1 tablet (1 mg total) by mouth 3 (three) times daily as needed for anxiety (anxiety). Patient taking differently: Take 1 mg by mouth 3 (three) times daily as needed for anxiety. 04/08/22 04/08/23 Yes Vista Mink, MD  cloNIDine (CATAPRES - DOSED IN MG/24 HR) 0.3 mg/24hr patch Place 1 patch (0.3 mg total) onto the skin once a week. Patient taking differently: Place 0.3 mg onto the skin every Sunday. 03/20/22  Yes Mariel Aloe, MD  diphenhydrAMINE (BENADRYL) 25 MG tablet Take 25 mg by mouth daily as needed for allergies.   Yes [provider]  EPINEPHrine 0.3 mg/0.3 mL IJ SOAJ injection Inject 0.3 mg into the muscle as needed for anaphylaxis. 01/15/21  Yes Garnet Sierras, DO  famotidine (PEPCID) 20 MG tablet Take 1 tablet (20 mg total) by mouth 2 (two) times daily. 12/04/21  Yes Garnet Sierras, DO  FLUoxetine (PROZAC) 10 MG capsule  Take 1 capsule (10 mg total) by mouth daily for 7 days, THEN 2 capsules (20 mg total) daily for 7 days, THEN 3 capsules (30 mg total) daily for 7 days, THEN 4 capsules (40 mg total) daily for 20 days. Patient taking differently: 20 mg once daily. 04/08/22 05/19/22 Yes Vista Mink, MD  gabapentin (NEURONTIN) 300 MG capsule Take 1 capsule (300 mg total) by mouth 2 (two) times daily. 03/17/22 07/05/22 Yes Mariel Aloe, MD  levothyroxine (SYNTHROID) 125 MCG tablet Take 1 tablet (125 mcg total) by mouth daily before breakfast. 05/24/21  Yes Philemon Kingdom, MD  lurasidone (LATUDA) 20 MG TABS tablet Take 1 tablet (20 mg total) by mouth daily. Take with food (dinner) Patient taking differently: Take 20 mg by mouth daily with supper. 04/08/22  Yes Vista Mink, MD  Multiple Vitamin (MULTIVITAMIN) tablet Take 1 tablet by mouth daily.   Yes [provider]  Oxycodone HCl 10 MG TABS Take 1 tablet (10 mg total) by mouth every 6 (six) hours as needed. Post op pain 04/22/22  Yes Aletha Halim, MD  polyethylene glycol powder (GLYCOLAX/MIRALAX) 17 GM/SCOOP powder Take 17 g by mouth daily as needed.   Yes [provider]  propranolol (INDERAL) 20 MG tablet Take 1 tablet (20 mg total) by mouth 2 (two) times daily as needed (for anxiety). 04/08/22  Yes Vista Mink, MD  SYMBICORT 80-4.5 MCG/ACT inhaler Inhale 2 puffs into the lungs 2 (two) times daily as needed (shortness of breath).   Yes [provider]  valsartan (DIOVAN) 320 MG tablet Take 1 tablet (320 mg total) by mouth daily. 04/09/22  Yes Skeet Latch, MD  venlafaxine XR (EFFEXOR-XR) 75 MG 24 hr capsule Take 75 mg by mouth daily. Take with 75 mg for a total of 112.5 mg for 7 days before decreasing to 75 mg daily for 7 days and then decrease to 37.5 mg for 7 days before discontinuing   Yes [provider]  amLODipine (NORVASC) 10 MG tablet Take 1 tablet (10 mg total) by mouth daily. Patient not taking: Reported on  04/25/2022 05/31/21   Freada Bergeron, MD  chlorthalidone (HYGROTON) 25 MG tablet Take 1 tablet (25 mg total) by mouth daily.  Patient not taking: Reported on 04/25/2022 04/09/22   Skeet Latch, MD  hydrOXYzine (ATARAX) 25 MG tablet Take 1 tablet (25 mg total) by mouth every 6 (six) hours. Patient not taking: Reported on 04/15/2022 04/13/22   Tretha Sciara, MD  ondansetron (ZOFRAN-ODT) 4 MG disintegrating tablet Take 1 tablet (4 mg total) by mouth every 8 (eight) hours as needed for nausea or vomiting. Patient not taking: Reported on 04/15/2022 03/02/22   Rex Kras, PA  opium-belladonna (B&O SUPPRETTES) 16.2-60 MG suppository Place 1 suppository rectally as needed for bladder spasms. Patient not taking: Reported on 04/15/2022 04/12/22   Tacy Learn, PA-C  promethazine (PHENERGAN) 25 MG suppository Place 1 suppository (25 mg total) rectally every 6 (six) hours as needed for nausea or vomiting. Patient not taking: Reported on 04/25/2022 04/16/22   Drenda Freeze, MD  spironolactone (ALDACTONE) 25 MG tablet Take 1 tablet (25 mg total) by mouth daily. Patient not taking: Reported on 04/15/2022 09/19/21   Freada Bergeron, MD  venlafaxine XR (EFFEXOR XR) 37.5 MG 24 hr capsule Take 1 capsule (37.5 mg total) by mouth daily. Take with 75 mg for a total of 112.5 mg for 7 days before decreasing to 75 mg daily for 7 days and then decrease to 37.5 mg for 7 days before discontinuing Patient not taking: Reported on 04/25/2022 04/08/22 04/08/23  Vista Mink, MD    Current Facility-Administered Medications  Medication Dose Route Frequency Provider Last Rate Last Admin   0.9 %  sodium chloride infusion   Intravenous PRN Velna Ochs, MD   Stopped at 04/27/22 0023   acetaminophen (TYLENOL) tablet 1,000 mg  1,000 mg Oral TID Lacinda Axon, MD   1,000 mg at 04/29/22 0907   albuterol (PROVENTIL) (2.5 MG/3ML) 0.083% nebulizer solution 2.5 mg  2.5 mg Inhalation Q4H PRN Lacinda Axon, MD        alum & mag hydroxide-simeth (MAALOX/MYLANTA) 200-200-20 MG/5ML suspension 30 mL  30 mL Oral Q4H PRN Velna Ochs, MD   30 mL at 04/27/22 2145   amLODipine (NORVASC) tablet 10 mg  10 mg Oral Daily Buford Dresser, MD   10 mg at 04/29/22 0911   bisacodyl (DULCOLAX) EC tablet 5 mg  5 mg Oral Daily PRN Linus Galas, MD       Chlorhexidine Gluconate Cloth 2 % PADS 6 each  6 each Topical Q0600 Velna Ochs, MD   6 each at 04/29/22 0658   chlorthalidone (HYGROTON) tablet 25 mg  25 mg Oral Daily Lily Kocher, PA-C   25 mg at 04/29/22 [id number]   clonazePAM (KLONOPIN) tablet 1 mg  1 mg Oral TID PRN Lacinda Axon, MD   1 mg at 04/28/22 1202   enoxaparin (LOVENOX) injection 50 mg  50 mg Subcutaneous Q24H Lacinda Axon, MD   50 mg at 04/29/22 0910   feeding supplement (ENSURE ENLIVE / ENSURE PLUS) liquid 237 mL  237 mL Oral BID BM Virl Axe, MD   237 mL at 04/27/22 1749   FLUoxetine (PROZAC) capsule 20 mg  20 mg Oral Daily Lacinda Axon, MD   20 mg at 04/29/22 0908   gabapentin (NEURONTIN) capsule 300 mg  300 mg Oral TID Virl Axe, MD       hydrALAZINE (APRESOLINE) injection 10 mg  10 mg Intravenous Q6H PRN Vikki Ports R, PA-C       HYDROmorphone (DILAUDID) injection 1 mg  1 mg Intravenous TID PRN Virl Axe, MD  irbesartan (AVAPRO) tablet 300 mg  300 mg Oral Daily Buford Dresser, MD   300 mg at 04/29/22 0908   levothyroxine (SYNTHROID) tablet 150 mcg  150 mcg Oral Q0600 Lacinda Axon, MD   150 mcg at 04/29/22 [id number]   lidocaine (LIDODERM) 5 % 1 patch  1 patch Transdermal Q24H Virl Axe, MD   1 patch at 04/28/22 1655   lurasidone (LATUDA) tablet 20 mg  20 mg Oral Q2200 Lacinda Axon, MD   20 mg at 04/28/22 2205   melatonin tablet 5 mg  5 mg Oral QHS PRN Atway, Rayann N, DO   5 mg at 04/28/22 2205   methocarbamol (ROBAXIN) tablet 750 mg  750 mg Oral TID Virl Axe, MD   750 mg at 04/29/22 0908   mometasone-formoterol  (DULERA) 100-5 MCG/ACT inhaler 2 puff  2 puff Inhalation BID Lacinda Axon, MD   2 puff at 04/29/22 1130   oxyCODONE (Oxy IR/ROXICODONE) immediate release tablet 5 mg  5 mg Oral Q6H PRN Virl Axe, MD   5 mg at 04/29/22 1127   pantoprazole (PROTONIX) EC tablet 40 mg  40 mg Oral BID AC Virl Axe, MD   40 mg at 04/29/22 [id number]   polyethylene glycol (MIRALAX / GLYCOLAX) packet 17 g  17 g Oral BID Freada Bergeron, MD   17 g at 04/29/22 0911   promethazine (PHENERGAN) 12.5 mg in sodium chloride 0.9 % 50 mL IVPB  12.5 mg Intravenous Q6H PRN Virl Axe, MD       venlafaxine XR (EFFEXOR-XR) 24 hr capsule 75 mg  75 mg Oral Q breakfast Lacinda Axon, MD   75 mg at 04/29/22 [id number]    Allergies as of 04/23/2022 - Review Complete 04/23/2022  Allergen Reaction Noted   Coreg [carvedilol] Shortness Of Breath and Other (See Comments) 03/06/2022   Other Shortness Of Breath and Other (See Comments) 11/06/2015   Red dye Other (See Comments) 11/06/2015   Compazine [prochlorperazine maleate] Other (See Comments) 11/06/2015   Vicodin [hydrocodone-acetaminophen] Itching, Nausea And Vomiting, and Other (See Comments) 11/06/2015   Amitriptyline hcl Other (See Comments) 05/08/2020   Duloxetine hcl Other (See Comments) 05/08/2020   Ibuprofen  04/18/2022   Lisinopril Cough 08/10/2021   Naproxen Other (See Comments) 03/06/2022   Labetalol Nausea And Vomiting 06/22/2019   Metoclopramide Other (See Comments) and Swelling 07/24/2017   Nifedipine Nausea And Vomiting and Other (See Comments) 09/07/2018    Social History   Socioeconomic History   Marital status: Married    Spouse name: Not on file   Number of children: 3   Years of education: Not on file   Highest education level: 8th grade  Occupational History   Not on file  Tobacco Use   Smoking status: Never   Smokeless tobacco: Never   Tobacco comments:    She tried a cigarette and never smoked again  Vaping Use   Vaping Use:  Never used  Substance and Sexual Activity   Alcohol use: Yes    Comment: bottle of wine a week-occ   Drug use: No   Sexual activity: Not Currently    Birth control/protection: Surgical  Other Topics Concern   Not on file  Social History Narrative   Not on file   Social Determinants of Health   Financial Resource Strain: Not on file  Food Insecurity: No Food Insecurity (04/24/2022)   Hunger Vital Sign    Worried About Running Out of Food in the Last Year:  Never true    Ran Out of Food in the Last Year: Never true  Recent Concern: Food Insecurity - Food Insecurity Present (03/12/2022)   Hunger Vital Sign    Worried About Running Out of Food in the Last Year: Often true    Ran Out of Food in the Last Year: Often true  Transportation Needs: No Transportation Needs (04/24/2022)   PRAPARE - Hydrologist (Medical): No    Lack of Transportation (Non-Medical): No  Recent Concern: Transportation Needs - Unmet Transportation Needs (03/12/2022)   PRAPARE - Transportation    Lack of Transportation (Medical): Yes    Lack of Transportation (Non-Medical): Yes  Physical Activity: Inactive (04/09/2022)   Exercise Vital Sign    Days of Exercise per Week: 0 days    Minutes of Exercise per Session: 0 min  Stress: Not on file  Social Connections: Not on file  Intimate Partner Violence: Not At Risk (04/24/2022)   Humiliation, Afraid, Rape, and Kick questionnaire    Fear of Current or Ex-Partner: No    Emotionally Abused: No    Physically Abused: No    Sexually Abused: No    Review of Systems: All systems reviewed and negative except where noted in HPI.  Physical Exam: Vital signs in last 24 hours: Temp:  [97.9 F (36.6 C)-98.6 F (37 C)] 98 F (36.7 C) (03/05 0753) Pulse Rate:  [53-68] 64 (03/05 0753) Resp:  [17-18] 17 (03/05 0753) BP: (136-162)/(87-100) 148/100 (03/05 0753) SpO2:  [95 %-97 %] 96 % (03/05 0753) Last BM Date : 04/27/22  General:  Alert female  in NAD Psych:  Pleasant, cooperative. Normal mood and affect Eyes: Pupils equal Ears:  Normal auditory acuity Nose: No deformity, discharge or lesions Neck:  Supple, no masses felt Lungs:  Clear to auscultation.  Heart:  Regular rate, regular rhythm.  Abdomen:  Soft, nondistended, nontender, active bowel sounds, no masses felt Rectal :  Deferred Msk: Symmetrical without gross deformities.  Neurologic:  Alert, oriented, grossly normal neurologically Extremities : left hand amputated. Deformities of right forearm.   Intake/Output from previous day: 03/04 0701 - 03/05 0700 In: 776.2 [P.O.:720; I.V.:6.2; IV Piggyback:50] Out: 2300 [Urine:2300] Intake/Output this shift:  No intake/output data recorded.    Principal Problem:   Adrenal insufficiency (HCC) Active Problems:   Hypothyroidism due to Hashimoto's thyroiditis   Bradycardia   Abdominal pain, chronic, right lower quadrant   Post-operative pain   Primary hypertension   Nausea and vomiting   Lower abdominal pain    Tye Savoy, NP-C @  04/29/2022, 11:56 AM

## 2022-04-29 NOTE — Progress Notes (Signed)
Mobility Specialist Progress Note:   04/29/22 1200  Mobility  Activity Ambulated independently in hallway  Level of Assistance Modified independent, requires aide device or extra time  Assistive Device None  Distance Ambulated (ft) 800 ft  Activity Response Tolerated well  Mobility Referral Yes  $Mobility charge 1 Mobility   Pt agreeable to mobility session. C/o moderate pain and generalized weakness at this time. No physical assistance required, only incr time d/t pain. Pt left sitting in chair with all needs met, encouraged frequent OOB mobility.   Nelta Numbers Mobility Specialist Please contact via SecureChat or  Rehab office at [phone]

## 2022-04-29 NOTE — Progress Notes (Signed)
Subjective:   Summary: Krystal [name] is a 32 y.o. year old female currently admitted on the IMTS HD#4 for bradycardia.  Overnight Events: Blood pressure more well-managed, remains bradycardic in the 50s, remains on room air.  Still getting Dilaudid as often as possible.  Pain regimen changed yesterday, see previous note and attestation.  States she had a pretty bad pain episode, happening every couple of hours. Toradol is not helping. Nausea still comes and goes, vomited once this AM. Phenergan is helping. No chest pain or trouble breathing. Spoke with husband via phone call, updated him on plan, and answered any questions he had.  We had an extensive conversation regarding management of symptoms and appropriate usage of opioid medicines.  She is understanding that we cannot continue her opioids indefinitely and just wants to have sufficient relief.  Discussed appropriate expectations regarding her pain management.  Working on transitioning to primarily p.o. pain medicines and managing nausea at this point.  Additionally, she requests that GI evaluate her while she is still inpatient.   Objective:  Vital signs in last 24 hours: Vitals:   04/28/22 1245 04/28/22 2049 04/29/22 0058 04/29/22 0356  BP: (!) 162/96 (!) 142/91 136/87 (!) 151/96  Pulse: 64 68 (!) 53 (!) 55  Resp: '18 18  18  '$ Temp: 98.2 F (36.8 C) 98.6 F (37 C) 97.9 F (36.6 C) 98.1 F (36.7 C)  TempSrc: Oral Oral Oral Oral  SpO2: 97% 96% 96% 95%  Weight:      Height:       Supplemental O2: Room Air SpO2: 95 %   Physical Exam:  General: uncomfortable appearing young female, laying in bed CV: RRR, no m/r/g. Pulm: normal WOB on RA. Abdomen: soft, nondistended. Tender to palpation of suprapubic and LLQ.  Normoactive bowel sounds. MSK: s/p amputation of L hand, R wrist reconstructed with healed surgical scar. Skin: warm and dry. Neuro: AAOx3, no focal deficits, moving all extremities  spontaneously.   Filed Weights   04/23/22 1750  Weight: 108 kg     Intake/Output Summary (Last 24 hours) at 04/29/2022 0631 Last data filed at 04/29/2022 0450 Gross per 24 hour  Intake 776.21 ml  Output 2625 ml  Net -1848.79 ml    Net IO Since Admission: -11,396.61 mL [04/29/22 0631]  Pertinent Labs:    Latest Ref Rng & Units 04/27/2022    3:52 AM 04/25/2022   11:02 AM 04/24/2022    2:15 AM  CBC  WBC 4.0 - 10.5 K/uL 9.6  5.6  9.2   Hemoglobin 12.0 - 15.0 g/dL 14.4  12.7  12.4   Hematocrit 36.0 - 46.0 % 41.6  37.1  36.1   Platelets 150 - 400 K/uL 337  289  262        Latest Ref Rng & Units 04/29/2022    2:38 AM 04/28/2022    9:36 AM 04/27/2022    3:52 AM  CMP  Glucose 70 - 99 mg/dL 113  117  149   BUN 6 - 20 mg/dL '12  14  8   '$ Creatinine 0.44 - 1.00 mg/dL 1.01  1.02  0.95   Sodium 135 - 145 mmol/L 136  134  138   Potassium 3.5 - 5.1 mmol/L 3.8  4.3  4.5   Chloride 98 - 111 mmol/L 98  99  100   CO2 22 - 32 mmol/L 30  28  25   Calcium 8.9 - 10.3 mg/dL 9.4  9.1  9.8     Imaging: CT abdomen IMPRESSION: 1. Foci of free air in the upper abdomen, umbilicus, and low anterior abdominal wall, likely related to history of recent surgery. No hematoma or abscess is seen. 2. Trace amount of free fluid in the cul-de-sac. 3. Hepatic steatosis. 4. Foley catheter in the urinary bladder.     EKG: NSR rates 60-80 this morning  Assessment/Plan:   Principal Problem:   Adrenal insufficiency (HCC) Active Problems:   Hypothyroidism due to Hashimoto's thyroiditis   Bradycardia   Abdominal pain, chronic, right lower quadrant   Post-operative pain   Primary hypertension   Nausea and vomiting   Lower abdominal pain   Patient Summary: Krystal [name] is a 32 y.o. 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   Adrenal Insufficiency Chronic abdominal pain Nausea/vomiting She has persistent pain with minimal relief from Toradol.  Had extensive discussion about pain management this morning, will try to wean off opioids over the next couple days.  Her nausea is better managed with Phenergan, she is amenable to treating with just Phenergan instead of Zofran.  As previously stated, believe her symptoms are multifactorial in etiology likely due to some combination of adrenal insufficiency, interstitial cystitis, possible celiac disease versus other enteropathy, and functional pain.  Also, given her description of vomiting undigested food almost 8 hours after a meal, there is concern about gastroparesis as well.  With high clinical suspicion for celiac versus other enteropathy, I do believe she warrants duodenal biopsy to rule out these diagnoses despite negative initial antibody panel.  I will also send out IgA levels, Endomysial IgA antibody is also possible future diagnostic workup.  Will consult GI for further recommendations regarding workup for enteropathy and also for possible gastroparesis.  She has had good response to IV hydrocortisone for adrenal insufficiency. -Appreciate GI recommendations regarding possible celiac disease versus other autoimmune enteropathy, possible gastroparesis. - Phenergan 12.5 every 6 as needed for nausea.  DC Zofran.  Of note, she has documented reactions to Reglan and Compazine. -Continue IV hydrocortisone for adrenal insufficiency -Tylenol '1000mg'$  TID, oxycodone 5 mg every 6 hours as needed, Dilaudid 1 mg every 8 hours as needed for breakthrough.  Lidocaine patch, Robaxin 750 3 times daily as needed, heat/ice -Increase gabapentin to 300 mg 3 times daily.  Of note, this medicine did have warning for red dye but she takes it at home without any reaction. -f/u ACTH level -Miralax daily, dulcolax prn -dysphagia 3 diet, Ensure, gluten-free diet  -will need  to establish with endocrinology as outpatient  Sinus bradycardia Rates continue to improve, remains NSR this morning. More reactive with pain/discomfort compared to prior. -appreciate cardiology management -hold propranolol, clonidine -telemetry -if HR fails to improve, consider EP consult -intervention with pacing and possible atropine if symptomatic bradycardia with acute BP drop or change in mentation   Urinary retention Possible interstitial cystitis Urinary retention thought to be anesthetic complication after recent lap ovarian cystectomy. She failed voiding trial, requiring reinsertion of foley catheter 04/26/2022. She has undergone extensive workup with urology and has failed multiple outpatient therapies, including intravesical local anesthetic. She was referred to [facility] Amalia Ha[facility] at [facility] for further workup, but has not yet been seen by him. Unclear if there is a component of interstitial cystitis  at play given experienced more discomfort when foley was removed. Discussed with urology, no acute hospital interventions required. Will need outpatient urology follow up, especially if needing to be discharged with foley. -maintain foley   Resistant Hypertension Now on amlodipine 10, irbesartan 300, chlorthalidone 25, still mildly hypertensive.  -Appreciate cardiology management -holding clonidine, propanolol  -IV hydralazine 10 mg as needed for SBP >160   Hyponatremia Resolved, will CTM.  Of note she still diuresed 2.3 L today.  GERD Takes pepcid at home. Likely worsened here with initiation of steroid therapy. Starting PPI BID. Watch for worsening symptoms after starting NSAIDs -PO protonix '40mg'$  BID  Hashimoto thyroiditis Synthroid increased.  Adrenal insufficiency management as above. -Synthroid 150 mcg daily -Repeat TSH mid April 2024   Anxiety/depression Borderline personality disorder Patient reports he is currently being tapered off venlafaxine with uptitrating of her Prozac. Has  significant trauma from recent medical events. Expressed concerns about mood to consulting cardiologist, including feelings of worthlessness and wish to be changed to DNR/DNI. Discussed with psychiatry via phone call, patient is noted to have borderline personality and splitting. They stated that she does not have a history of suicide attempts and believe her risk to be low. Advised to continue current medications and follow up with outpatient psychiatry. -Venlafaxine 75 mg daily -Prozac 20 mg daily -Latuda 20 mg daily -Clonazepam 1 mg TID PRN for anxiety - f/u with outpatient psychiatry - chaplain services ordered   Asthma Respiratory status stable. No wheezing or rales on exam. Home regimen includes Symbicort and PRN Ventolin. -Resume as needed albuterol inhaler -Dulera 2 puffs twice daily  Diet:  dysphagia 3 IVF: None VTE: Enoxaparin Code: Full PT/OT recs: Pending  Dispo: Pending clinical improvement  Linus Galas, MD Internal medicine PGY 1 Pager: [phone]  Please contact the on call pager after 5 pm and on weekends at [phone].

## 2022-04-29 NOTE — Plan of Care (Signed)
  Problem: Education: Goal: Knowledge of General Education information will improve Description: Including pain rating scale, medication(s)/side effects and non-pharmacologic comfort measures Outcome: Progressing   Problem: Clinical Measurements: Goal: Respiratory complications will improve Outcome: Progressing   Problem: Nutrition: Goal: Adequate nutrition will be maintained Outcome: Progressing   Problem: Safety: Goal: Ability to remain free from injury will improve Outcome: Progressing

## 2022-04-30 ENCOUNTER — Ambulatory Visit (HOSPITAL_COMMUNITY): Payer: Medicaid Other | Admitting: Psychiatry

## 2022-04-30 LAB — BASIC METABOLIC PANEL
Anion gap: 12 (ref 5–15)
BUN: 13 mg/dL (ref 6–20)
CO2: 28 mmol/L (ref 22–32)
Calcium: 9.5 mg/dL (ref 8.9–10.3)
Chloride: 98 mmol/L (ref 98–111)
Creatinine, Ser: 0.93 mg/dL (ref 0.44–1.00)
GFR, Estimated: >60 mL/min (ref >60)
Glucose, Bld: 86 mg/dL (ref 70–99)
Potassium: 4 mmol/L (ref 3.5–5.1)
Sodium: 138 mmol/L (ref 135–145)

## 2022-04-30 LAB — GLUCOSE, CAPILLARY
Glucose-Capillary: 82 mg/dL (ref 70–99)
Glucose-Capillary: 95 mg/dL (ref 70–99)
Glucose-Capillary: 91 mg/dL (ref 70–99)
Glucose-Capillary: 83 mg/dL (ref 70–99)

## 2022-04-30 LAB — CREATININE, URINE, RANDOM: Creatinine, Urine: 36 mg/dL

## 2022-04-30 LAB — IGA: IgA: 223 mg/dL (ref 87–352)

## 2022-04-30 MED ORDER — RIVAROXABAN 10 MG PO TABS
10.0000 mg | ORAL_TABLET | Freq: Every day | ORAL | Status: DC
  Administered 2022-05-01 – 2022-05-05 (×4): 10 mg via ORAL
  Filled 2022-04-30 (×5): qty 1

## 2022-04-30 MED ORDER — BISACODYL 10 MG RE SUPP
10.0000 mg | Freq: Once | RECTAL | Status: AC
  Administered 2022-04-30: 10 mg via RECTAL
  Filled 2022-04-30: qty 1

## 2022-04-30 MED ORDER — HYDROCORTISONE SOD SUC (PF) 100 MG IJ SOLR
100.0000 mg | Freq: Three times a day (TID) | INTRAMUSCULAR | Status: DC
  Filled 2022-04-30: qty 2

## 2022-04-30 MED ORDER — OXYBUTYNIN CHLORIDE 5 MG PO TABS
5.0000 mg | ORAL_TABLET | Freq: Three times a day (TID) | ORAL | Status: DC | PRN
  Administered 2022-04-30: 5 mg via ORAL
  Filled 2022-04-30: qty 1

## 2022-04-30 MED ORDER — ORAL CARE MOUTH RINSE: 15.0000 mL | OROMUCOSAL | Status: DC | PRN

## 2022-04-30 MED ORDER — KETOROLAC TROMETHAMINE 15 MG/ML IJ SOLN
30.0000 mg | Freq: Once | INTRAMUSCULAR | Status: AC
  Administered 2022-04-30: 30 mg via INTRAVENOUS
  Filled 2022-04-30: qty 2

## 2022-04-30 MED ORDER — ONDANSETRON HCL 4 MG/2ML IJ SOLN
4.0000 mg | Freq: Four times a day (QID) | INTRAMUSCULAR | Status: DC
  Administered 2022-04-30 – 2022-05-02 (×5): 4 mg via INTRAVENOUS
  Filled 2022-04-30 (×5): qty 2

## 2022-04-30 MED ORDER — HYDROMORPHONE HCL 1 MG/ML IJ SOLN
1.0000 mg | Freq: Once | INTRAMUSCULAR | Status: AC
  Administered 2022-04-30: 1 mg via INTRAVENOUS
  Filled 2022-04-30: qty 1

## 2022-04-30 MED ORDER — HYDROMORPHONE HCL 2 MG PO TABS
4.0000 mg | ORAL_TABLET | Freq: Four times a day (QID) | ORAL | Status: DC | PRN
  Administered 2022-04-30 – 2022-05-03 (×8): 4 mg via ORAL
  Filled 2022-04-30 (×10): qty 2

## 2022-04-30 MED ORDER — HYDROCORTISONE 10 MG PO TABS
10.0000 mg | ORAL_TABLET | Freq: Two times a day (BID) | ORAL | Status: DC
  Administered 2022-04-30 – 2022-05-03 (×4): 10 mg via ORAL
  Filled 2022-04-30 (×8): qty 1

## 2022-04-30 NOTE — TOC Initial Note (Signed)
Transition of Care [facility]) - Initial/Assessment Note    Patient Details  Name: Krystal [name] MRN: [id number] Date of Birth: 05-Oct-1990  Transition of Care [facility]) CM/SW Contact:    Bethena Roys, RN Phone Number: 04/30/2022, 2:59 PM  Clinical Narrative: Case Manager spoke with patient regarding disposition and PCP needs- patient states the Internal Medicine Clinic will schedule a hospital follow up for the patient. Patient sates she was not working prior to admission and is without insurance. Case Manager will follow the patient for medication assistance via [place].                  Expected Discharge Plan: Home/Self Care Barriers to Discharge: Continued Medical Work up   Patient Goals and CMS Choice Patient states their goals for this hospitalization and ongoing recovery are:: to feel better.   Expected Discharge Plan and Services In-house Referral: PCP / [facility] Discharge Planning Services: CM Consult, Follow-up appt scheduled, [place] [place] [facility] Post Acute Care Choice: NA Living arrangements for the past 2 months: Single Family Home                 DME Arranged: N/A  Prior Living Arrangements/Services Living arrangements for the past 2 months: Single Family Home Lives with:: Spouse Patient language and need for interpreter reviewed:: Yes Do you feel safe going back to the place where you live?: Yes      Need for Family Participation in Patient Care: No (Comment) Care giver support system in place?: No (comment)   Criminal Activity/Legal Involvement Pertinent to Current Situation/Hospitalization: No - Comment as needed  Activities of Daily Living Home Assistive Devices/Equipment: None ADL Screening (condition at time of admission) Patient's cognitive ability adequate to safely complete daily activities?: Yes Is the patient deaf or have difficulty hearing?: No Does the patient have difficulty seeing, even when wearing glasses/contacts?: No Does the patient  have difficulty concentrating, remembering, or making decisions?: No Patient able to express need for assistance with ADLs?: Yes Does the patient have difficulty dressing or bathing?: No Independently performs ADLs?: Yes (appropriate for developmental age) Does the patient have difficulty walking or climbing stairs?: No Weakness of Legs: None Weakness of Arms/Hands: None  Permission Sought/Granted Permission sought to share information with : Family Supports, Case Manager   Emotional Assessment Appearance:: Appears stated age Attitude/Demeanor/Rapport: Unable to Assess Affect (typically observed): Unable to Assess Orientation: : Oriented to Self, Oriented to  Time, Oriented to Situation, Oriented to Place   Psych Involvement: No (comment)  Admission diagnosis:  Bradycardia [R00.1] Post-operative pain [G89.18] Patient Active Problem List   Diagnosis Date Noted   Chronic diarrhea 04/29/2022   Nausea and vomiting 04/28/2022   Lower abdominal pain 04/28/2022   Adrenal insufficiency ([place]) 04/26/2022   Primary hypertension 04/26/2022   Abdominal pain, chronic, right lower quadrant 04/25/2022   Post-operative pain 04/25/2022   Bradycardia 04/24/2022   Hemorrhagic cyst of left ovary 04/15/2022   AKI (acute kidney injury) ([place]) 03/16/2022   Hypothyroidism 03/11/2022   GERD with esophagitis 03/11/2022   Retinal vein occlusion of right eye 03/11/2022   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 03/05/2022   Moderate persistent asthma with acute exacerbation 12/04/2021   Cervicalgia 10/31/2021   Borderline personality disorder in adult [facility]) 10/21/2021   Intractable nausea and vomiting 07/05/2021   Anisocoria 07/02/2021   History of excision of intestinal structure 05/23/2021   Attention deficit disorder 04/17/2021   Chronic fatigue syndrome 04/17/2021   Anosmia 04/17/2021   Chronic  urticaria 01/15/2021   Local reaction to hymenoptera sting 01/15/2021   Perennial  allergic rhinitis 01/15/2021   MDD (major depressive disorder), recurrent episode, severe ([place]) 11/19/2020   Fibromyalgia 05/11/2020   Gastroesophageal reflux disease 05/11/2020   Interstitial cystitis 05/11/2020   PTSD (post-traumatic stress disorder) 05/11/2020   Primary insomnia 05/11/2020   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   Hypertensive urgency 05/08/2020   Hypothyroidism due to Hashimoto's thyroiditis 10/03/2019   Anxiety 06/16/2019   Obesity 06/07/2019   Lumbar spondylosis 06/23/2018   Sacroiliac inflammation ([place]) 06/23/2018   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   History of upper limb amputation, wrist, 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   PCP:  Patient, No Pcp Per Pharmacy:   [facility] [place], [address] [place] [address] Phone: [phone] Fax: [phone]  [place] [address] [place]. [place]. [place] [address] Phone: [phone] Fax: [place], [place] [address] Phone: [phone] Fax: [phone]   Social Determinants of Health (SDOH) Social History: SDOH Screenings   Food Insecurity: No Food Insecurity (04/24/2022)  Recent Concern: Food Insecurity - Food Insecurity Present (03/12/2022)  Housing: Low Risk  (04/24/2022)  Transportation Needs: No Transportation Needs (04/24/2022)  Recent Concern: Transportation Needs - Unmet Transportation Needs (03/12/2022)  Utilities: Not At Risk (04/24/2022)  Recent Concern: Utilities - At Risk (03/12/2022)  Alcohol Screen: Low Risk  (04/09/2022)  Depression (PHQ2-9): High Risk (11/11/2021)  Physical Activity: Inactive (04/09/2022)  Tobacco Use: Low Risk  (04/23/2022)  Readmission Risk Interventions     No data to display

## 2022-04-30 NOTE — Progress Notes (Signed)
Patient requesting IV pain medicine to be given early due to vomiting episode. Explained to patient that we will give medication for nausea and allow that time to work and then can take PO pain medication as her IV medicine is only every 8 hrs. Patient requests that I ask the primary team to request an early dose

## 2022-04-30 NOTE — Progress Notes (Signed)
Mobility Specialist Progress Note:   04/30/22 1115  Mobility  Activity Ambulated independently in hallway;Ambulated with assistance in hallway  Level of Assistance Modified independent, requires aide device or extra time  Assistive Device None  Distance Ambulated (ft) 800 ft  Activity Response Tolerated well  Mobility Referral Yes  $Mobility charge 1 Mobility   Pt eager for mobility session. Required no physical assistance throughout. Pt asx with exertion, denies incr pain. Pt left sitting up in chair with all needs met.  Nelta Numbers Mobility Specialist Please contact via SecureChat or  Rehab office at [phone]

## 2022-04-30 NOTE — Progress Notes (Signed)
Subjective:   Summary: Krystal [name] is a 32 y.o. year old female currently admitted on the IMTS HD#5 for bradycardia.  Overnight Events: Vomited twice overnight.  No abdominal pain is stable.  Has received her oxycodone, Dilaudid, other pain regimen, Phenergan as appropriate.  Afebrile, slightly hypertensive, remains on room air, heart rates are improving  Still having persistent nausea and vomiting this morning.  Suprapubic abdominal pain.  Seems to be getting nauseous with oxycodone.  She is also experiencing worse fatigue and brain fog.  Of note, her IV hydrocortisone did fall off earlier today.  We had extensive conversation regarding possible symptoms for secondary adrenal insufficiency.  Her symptoms started after her most recent C-section delivery which was last May.  She said that she had to have the emergent C-section done for help syndrome.  She has had persistent vaginal bleeding since that delivery for about 4 to 5 months, which has resolved with her recent ovarian cystectomy.  She has not had any regular periods since that delivery.  She had trouble breast-feeding after that delivery.  She does notice dry and itchy patches of skin, and possible skin discoloration as well.  She had a thyroid level checked after the delivery and stayed on the same dose of Synthroid.  She has noticed that she is losing more hair.  She has never been told that she has adrenal insufficiency before.  Went back to assess in the afternoon and she is still in excruciating pain.  Okay to give one-time dose of IV Dilaudid.   Objective:  Vital signs in last 24 hours: Vitals:   04/30/22 0433 04/30/22 0600 04/30/22 0715 04/30/22 0744  BP: (!) 137/97   (!) 153/113  Pulse: 71 61 (!) 57 60  Resp: 16   17  Temp: 98 F (36.7 C)   98 F (36.7 C)  TempSrc: Oral   Oral  SpO2: 97% 95% 96% 98%  Weight:      Height:       Supplemental O2: Room Air SpO2: 98 %   Physical Exam:  General:  uncomfortable appearing young female, laying in bed CV: RRR, no m/r/g. Pulm: normal WOB on RA. Abdomen: soft, nondistended. Tender to palpation of suprapubic and LLQ.  hypoactive bowel sounds. MSK: s/p amputation of L hand, R wrist reconstructed with healed surgical scar. Skin: warm and dry. Neuro: AAOx3, no focal deficits, moving all extremities spontaneously.   Filed Weights   04/23/22 1750  Weight: 108 kg     Intake/Output Summary (Last 24 hours) at 04/30/2022 0810 Last data filed at 04/30/2022 0500 Gross per 24 hour  Intake 390.4 ml  Output 1800 ml  Net -1409.6 ml    Net IO Since Admission: -12,806.21 mL [04/30/22 0810]  Pertinent Labs:    Latest Ref Rng & Units 04/27/2022    3:52 AM 04/25/2022   11:02 AM 04/24/2022    2:15 AM  CBC  WBC 4.0 - 10.5 K/uL 9.6  5.6  9.2   Hemoglobin 12.0 - 15.0 g/dL 14.4  12.7  12.4   Hematocrit 36.0 - 46.0 % 41.6  37.1  36.1   Platelets 150 - 400 K/uL 337  289  262        Latest Ref Rng & Units 04/30/2022    2:53 AM 04/29/2022    2:38 AM 04/28/2022    9:36 AM  CMP  Glucose 70 -  99 mg/dL 86  113  117   BUN 6 - 20 mg/dL '13  12  14   '$ Creatinine 0.44 - 1.00 mg/dL 0.93  1.01  1.02   Sodium 135 - 145 mmol/L 138  136  134   Potassium 3.5 - 5.1 mmol/L 4.0  3.8  4.3   Chloride 98 - 111 mmol/L 98  98  99   CO2 22 - 32 mmol/L '28  30  28   '$ Calcium 8.9 - 10.3 mg/dL 9.5  9.4  9.1     Imaging: CT abdomen IMPRESSION: 1. Foci of free air in the upper abdomen, umbilicus, and low anterior abdominal wall, likely related to history of recent surgery. No hematoma or abscess is seen. 2. Trace amount of free fluid in the cul-de-sac. 3. Hepatic steatosis. 4. Foley catheter in the urinary bladder.     EKG: NSR rates 60-80 this morning  Assessment/Plan:   Principal Problem:   Adrenal insufficiency (HCC) Active Problems:   Hypothyroidism due to Hashimoto's thyroiditis   Bradycardia   Abdominal pain, chronic, right lower quadrant   Post-operative  pain   Primary hypertension   Nausea and vomiting   Lower abdominal pain   Chronic diarrhea   Patient Summary: Krystal [name] is a 32 y.o. 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   Adrenal Insufficiency Chronic abdominal pain Nausea/vomiting Her hydrocortisone fell off this morning.  Will transition to p.o. dose 10 mg twice daily for adrenal insufficiency.  Pituitary MRI was negative.  Discussed this case with Dr. Buddy Duty and he recommended still sending 21 hydroxylase and renin/aldosterone level because there are multiple confounding variables reducing the validity of her ACTH test.  Her pain and nausea are still persistent, appreciate GI recs regarding this.  We will be changing her pain regimen to wean narcotics.  Will keep on Phenergan for now for nausea.  If need to add another agent, would check QTc beforehand.  Appreciate GI recommendations with gastroparesis.  Of note, she is okay trialing oxybutynin despite the previously documented reaction.  Of note, other autoimmune etiologies including vasculitis might also be a consideration, but she is currently on steroids regardless. -Appreciate GI recommendations regarding possible celiac disease versus other autoimmune enteropathy, possible gastroparesis. - Phenergan 12.5 every 6h as needed for nausea.   Of note, she has documented reactions to Reglan and Compazine. -P.o. hydrocortisone 10 mg twice daily for adrenal insufficiency -Tylenol '1000mg'$  TID, p.o. Dilaudid 4 mg every 6 hours as needed.  Lidocaine patch, heat/ice.  Dcing oxycodone and Robaxin. -Increase gabapentin to 300 mg 3 times daily.  Of note, this medicine did have warning for red dye but she takes it at home without any reaction. -Starting oxybutynin 5 every 8 hours - Follow-up  21-hydroxylase, renin/Aldo level -Miralax daily, dulcolax prn -dysphagia 3 diet, Ensure, gluten-free diet  -will need to establish with endocrinology as outpatient  Sinus bradycardia Rates continue to improve, remains NSR this morning. More reactive with pain/discomfort compared to prior. -appreciate cardiology management -hold propranolol, clonidine -telemetry -if HR fails to improve, consider EP consult -intervention with pacing and possible atropine if symptomatic bradycardia with acute BP drop or change in mentation   Urinary retention Possible interstitial cystitis Urinary retention thought to be anesthetic complication after recent lap ovarian cystectomy. She failed voiding trial, requiring reinsertion of foley catheter 04/26/2022.  She has undergone extensive workup with urology and has failed multiple outpatient therapies, including intravesical local anesthetic. She was referred to Dr. Amalia Hailey at [facility] for further workup, but has not yet been seen by him. Unclear if there is a component of interstitial cystitis at play given experienced more discomfort when foley was removed. Discussed with urology, no acute hospital interventions required. Will need outpatient urology follow up, especially if needing to be discharged with foley.  She wants to try a trial of void today. -DC Foley.  Every 8 bladder scans.  If she retains for 2 subsequent bladder scans, replace Foley. - Trying oxybutynin 5 every 8 hours as above   Resistant Hypertension Now on amlodipine 10, irbesartan 300, chlorthalidone 25, still mildly hypertensive.  Will CTM.  Do not want to put her on spironolactone given there is still some concern for primary adrenal insufficiency. -Appreciate cardiology management -holding clonidine, propanolol  -IV hydralazine 10 mg as needed for SBP >160   Hyponatremia Resolved, will CTM.  Of note she still diuresed 2.3 L today.  GERD Takes pepcid at home. Likely worsened here with initiation  of steroid therapy. Starting PPI BID. Watch for worsening symptoms after starting NSAIDs -PO protonix '40mg'$  BID  Hashimoto thyroiditis Synthroid increased.  Adrenal insufficiency management as above. -Synthroid 150 mcg daily -Repeat TSH mid April 2024   Anxiety/depression Borderline personality disorder Patient reports he is currently being tapered off venlafaxine with uptitrating of her Prozac. Has significant trauma from recent medical events. Expressed concerns about mood to consulting cardiologist, including feelings of worthlessness and wish to be changed to DNR/DNI. Discussed with psychiatry via phone call, patient is noted to have borderline personality and splitting. They stated that she does not have a history of suicide attempts and believe her risk to be low. Advised to continue current medications and follow up with outpatient psychiatry. -Venlafaxine 75 mg daily -Prozac 20 mg daily -Latuda 20 mg daily -Clonazepam 1 mg TID PRN for anxiety - f/u with outpatient psychiatry - chaplain services ordered   Asthma Respiratory status stable. No wheezing or rales on exam. Home regimen includes Symbicort and PRN Ventolin. -Resume as needed albuterol inhaler -Dulera 2 puffs twice daily  Diet:  dysphagia 3 IVF: None VTE: Enoxaparin Code: Full PT/OT recs: Pending  Dispo: Pending clinical improvement  Linus Galas, MD Internal medicine PGY 1 Pager: [phone]  Please contact the on call pager after 5 pm and on weekends at [phone].

## 2022-04-30 NOTE — Progress Notes (Signed)
Daily Progress Note  DOA: 04/23/2022 Hospital Day: 8  Chief Complaint: Abdominal pain, nausea / vomiting.   ASSESSMENT  # 32 yo female admitted with acute on chronic "abdominal" pain. She tells me the pain is bladder related.     - Unrevealing CT scan - She is s/p left ovarian cystectomy and LOA 04/22/22, GYN has seen her inpatient and pain is not related to recent surgery or GYN organs.  - She gives a history of interstitial cystitis. Admitting team spoke with Urology on 3/1 who said  they had exhausted all treatment options. Nothing has been of benefit including.gabapentin, oxybutynin, several other medications, and also intravesical local anesthetics. - Crying. Having severe  "bladder pain". Wants pain meds changed / increased.      # Reported Interstitial cystitis. / urinary retention since GYN surgery.  Has foley. Has had extensive workup it sounds.  To see Dr. Amalia Hailey at [facility] soon.  # Chronic nausea / vomiting.  This is difficult to evaluate in setting of narcotic use. EGD in 2022 for abdominal pain / nausea was non-diagnostic. She cannot tolerate reglan. She has IV phenergan ordered Q 6 hours pain but taking on twice yesterday and so far twice today.    # Adrenal insufficiency Getting IV hydrocortisone   # Sinus bradycardia.  Beta blocker and Clonidine on hold.    # GERD, takes BID pepcid at home but has breakthrough reflux symptoms at night   # Frequent loose stool. Probably multifactorial ( functional, possibly bile salt acid related, possible food intolerances).  - Admitting team queries autoimmune enteropathy. Celiac does cause her diarrhea  and joint pains but she had a normal appearing duodenum on EGD in 2022 ( though no biopsies were done), she hasn't had any weight loss to suggest malabsorption. Celiac serologies negative. Could still be gluten sensitive in absence of celiac disease - No bowel wall inflammation on CT scan to suggest IBD. ESR and CRP normal.  - She  does mention that diarrhea got worse after cholecystectomy but it isn't necessarily meal triggered.    # Lactose intolerance.  Diary causes vomiting and diarrhea    # Hypothyroidism.  -On Synthroid -TSH elevated at 9.2.    # Anxiety / depression / hx of borderline personality disorder.  - Sees Psychiatry outpatient.    # Multiple medication allergies   PLAN  - We started cholestyramine yesterday due to a misunderstanding that she is currently constipated ( probably due to increased pain meds). Will hold cholestyramine for now. -At some point can consider outpatient colonoscopy with random colon biopsies though microscopic colitis is not common in her age group   Subjective / New events last 24 hours: Complaining of stabbing bladder pain. Says she just had an episode of vomiting .   Imaging:  MR BRAIN W WO CONTRAST  Result Date: 04/29/2022 CLINICAL DATA:  Adrenal insufficiency EXAM: MRI HEAD WITHOUT AND WITH CONTRAST TECHNIQUE: Multiplanar, multiecho pulse sequences of the brain and surrounding structures were obtained without and with intravenous contrast. CONTRAST:  35m GADAVIST GADOBUTROL 1 MMOL/ML IV SOLN COMPARISON:  03/05/2022 MRI head FINDINGS: Brain: The pituitary gland and sella are normal in appearance. On precontrast imaging, a normal pituitary bright spot is seen. No focal mass is seen. The infundibulum is midline and normal in appearance. No restricted diffusion to suggest acute or subacute infarct. No abnormal parenchymal or meningeal enhancement. No acute hemorrhage, mass, mass effect, or midline shift. No hydrocephalus or extra-axial collection. Normal  craniocervical junction. Vascular: Normal arterial flow voids. Normal arterial and venous enhancement. Skull and upper cervical spine: Normal marrow signal. Sinuses/Orbits: Clear paranasal sinuses. No acute finding in the orbits. Other: The mastoid air cells are well aerated. IMPRESSION: 1. Normal appearance of the pituitary  gland. No evidence of a pituitary mass. 2. No acute intracranial process. Electronically Signed   By: Merilyn Baba M.D.   On: 04/29/2022 20:34     Lab Results: No results for input(s): "WBC", "HGB", "HCT", "PLT" in the last 72 hours. BMET Recent Labs    04/28/22 0936 04/29/22 0238 04/30/22 0253  NA 134* 136 138  K 4.3 3.8 4.0  CL 99 98 98  CO2 '28 30 28  '$ GLUCOSE 117* 113* 86  BUN '14 12 13  '$ CREATININE 1.02* 1.01* 0.93  CALCIUM 9.1 9.4 9.5   LFT Recent Labs    04/29/22 0238  ALBUMIN 3.5   PT/INR No results for input(s): "LABPROT", "INR" in the last 72 hours.   Scheduled inpatient medications:   acetaminophen  1,000 mg Oral TID   amLODipine  10 mg Oral Daily   Chlorhexidine Gluconate Cloth  6 each Topical Q0600   chlorthalidone  25 mg Oral Daily   colestipol  1 g Oral BID   feeding supplement  237 mL Oral BID BM   FLUoxetine  20 mg Oral Daily   gabapentin  300 mg Oral TID   irbesartan  300 mg Oral Daily   levothyroxine  150 mcg Oral Q0600   lidocaine  1 patch Transdermal Q24H   lurasidone  20 mg Oral Q2200   methocarbamol  750 mg Oral TID   mometasone-formoterol  2 puff Inhalation BID   pantoprazole  40 mg Oral BID AC   [START ON 05/01/2022] rivaroxaban  10 mg Oral Daily   venlafaxine XR  75 mg Oral Q breakfast   Continuous inpatient infusions:   sodium chloride Stopped (04/27/22 0023)   promethazine (PHENERGAN) injection (IM or IVPB) 12.5 mg (04/30/22 1235)   PRN inpatient medications: sodium chloride, albuterol, alum & mag hydroxide-simeth, clonazePAM, hydrALAZINE, HYDROmorphone (DILAUDID) injection, melatonin, mouth rinse, oxyCODONE, promethazine (PHENERGAN) injection (IM or IVPB)  Vital signs in last 24 hours: Temp:  [98 F (36.7 C)-98.1 F (36.7 C)] 98 F (36.7 C) (03/06 0744) Pulse Rate:  [57-71] 60 (03/06 0744) Resp:  [16-20] 17 (03/06 0744) BP: (137-158)/(97-113) 153/113 (03/06 0744) SpO2:  [94 %-98 %] 98 % (03/06 0744) Last BM Date :  04/27/22  Intake/Output Summary (Last 24 hours) at 04/30/2022 1324 Last data filed at 04/30/2022 0500 Gross per 24 hour  Intake 390.4 ml  Output 1800 ml  Net -1409.6 ml    Intake/Output from previous day: 03/05 0701 - 03/06 0700 In: 390.4 [P.O.:240; IV Piggyback:150.4] Out: 1800 [Urine:1100; Emesis/NG output:700] Intake/Output this shift: No intake/output data recorded.   Physical Exam:  General: Alert female in NAD Heart:  Regular rate and rhythm.  Pulmonary: Normal respiratory effort Abdomen: Soft, nondistended, nontender. Normal bowel sounds. Extremities: No lower extremity edema  Neurologic: Alert and oriented Psych: crying    Principal Problem:   Adrenal insufficiency (HCC) Active Problems:   Hypothyroidism due to Hashimoto's thyroiditis   Bradycardia   Abdominal pain, chronic, right lower quadrant   Post-operative pain   Primary hypertension   Nausea and vomiting   Lower abdominal pain   Chronic diarrhea     LOS: 5 days   Tye Savoy ,NP 04/30/2022, 1:24 PM

## 2022-05-01 ENCOUNTER — Encounter (HOSPITAL_BASED_OUTPATIENT_CLINIC_OR_DEPARTMENT_OTHER): Payer: Self-pay

## 2022-05-01 LAB — PROLACTIN: Prolactin: 15.4 ng/mL (ref 4.8–33.4)

## 2022-05-01 LAB — FOLLICLE STIMULATING HORMONE: FSH: 5.9 m[IU]/mL

## 2022-05-01 LAB — RENAL FUNCTION PANEL
Albumin: 4 g/dL (ref 3.5–5.0)
Anion gap: 14 (ref 5–15)
BUN: 16 mg/dL (ref 6–20)
CO2: 25 mmol/L (ref 22–32)
Calcium: 9.4 mg/dL (ref 8.9–10.3)
Chloride: 96 mmol/L — ABNORMAL LOW (ref 98–111)
Creatinine, Ser: 0.99 mg/dL (ref 0.44–1.00)
GFR, Estimated: >60 mL/min (ref >60)
Glucose, Bld: 85 mg/dL (ref 70–99)
Phosphorus: 3.7 mg/dL (ref 2.5–4.6)
Potassium: 4.2 mmol/L (ref 3.5–5.1)
Sodium: 135 mmol/L (ref 135–145)

## 2022-05-01 LAB — CBC
HCT: 43.6 % (ref 36.0–46.0)
Hemoglobin: 15.4 g/dL — ABNORMAL HIGH (ref 12.0–15.0)
MCH: 32.4 pg (ref 26.0–34.0)
MCHC: 35.3 g/dL (ref 30.0–36.0)
MCV: 91.6 fL (ref 80.0–100.0)
Platelets: 353 10*3/uL (ref 150–400)
RBC: 4.76 MIL/uL (ref 3.87–5.11)
RDW: 12.2 % (ref 11.5–15.5)
WBC: 15.8 10*3/uL — ABNORMAL HIGH (ref 4.0–10.5)
nRBC: 0 % (ref 0.0–0.2)

## 2022-05-01 LAB — URINALYSIS, ROUTINE W REFLEX MICROSCOPIC
Bilirubin Urine: NEGATIVE
Glucose, UA: NEGATIVE mg/dL
Ketones, ur: NEGATIVE mg/dL
Nitrite: NEGATIVE
Protein, ur: NEGATIVE mg/dL
Specific Gravity, Urine: 1.005 (ref 1.005–1.030)
WBC, UA: >50 WBC/hpf (ref 0–5)
pH: 7 (ref 5.0–8.0)

## 2022-05-01 LAB — GLUCOSE, CAPILLARY: Glucose-Capillary: 91 mg/dL (ref 70–99)

## 2022-05-01 LAB — ESTRADIOL: Estradiol: 120 pg/mL

## 2022-05-01 MED ORDER — HYDROMORPHONE HCL 1 MG/ML IJ SOLN
1.0000 mg | Freq: Once | INTRAMUSCULAR | Status: AC
  Administered 2022-05-01: 1 mg via INTRAVENOUS
  Filled 2022-05-01: qty 1

## 2022-05-01 MED ORDER — MORPHINE SULFATE (PF) 2 MG/ML IV SOLN
2.0000 mg | Freq: Once | INTRAVENOUS | Status: AC
  Administered 2022-05-01: 2 mg via INTRAVENOUS
  Filled 2022-05-01: qty 1

## 2022-05-01 MED ORDER — OXYBUTYNIN CHLORIDE 5 MG PO TABS
5.0000 mg | ORAL_TABLET | Freq: Two times a day (BID) | ORAL | Status: DC
  Administered 2022-05-01: 5 mg via ORAL
  Filled 2022-05-01 (×2): qty 1

## 2022-05-01 MED ORDER — LORAZEPAM 2 MG/ML IJ SOLN
1.0000 mg | Freq: Once | INTRAMUSCULAR | Status: AC
  Administered 2022-05-01: 1 mg via INTRAVENOUS
  Filled 2022-05-01: qty 1

## 2022-05-01 MED ORDER — BISACODYL 10 MG RE SUPP: 10.0000 mg | Freq: Once | RECTAL | Status: DC

## 2022-05-01 MED ORDER — SODIUM CHLORIDE 0.9 % IV SOLN
2.0000 g | INTRAVENOUS | Status: DC
  Administered 2022-05-01 – 2022-05-03 (×3): 2 g via INTRAVENOUS
  Filled 2022-05-01 (×3): qty 20

## 2022-05-01 NOTE — Progress Notes (Addendum)
Alerted about pain, nausea and vomiting multiple times and reassessed multiple times today.  Thankful for timely communication from RN and staff.  Initially messaged by RN at ~1040 about the patient vomiting.  Asked to give her IV PRNs.  Patient is now mentioning to RN that Zofran and Phenergan have not been controlling her nausea at all, though she told us maybe 1 or 2 hours before that that she had had no prior episodes of emesis in approximately 12 hours.  Patient requesting oxycodone and IV Dilaudid once again, though we discussed how oxycodone is likely causing her to have nausea and vomiting and she previously had agreed to try to wean off narcotics.  I additionally asked for bladder check and pain reassessment after her nausea is controlled.  UA was obtained. ___________________________________________________________ Messaged again at approximately 1200 by RN about the patient still being in severe pain.  I asked to give one-time IV Dilaudid 1 mg.  The patient had voided shortly before this and bladder scan before void showed 298 cc and the patient had urinated about 200 cc.  Foley remains out.  Additionally, RN mentioned that lab was having difficulties obtaining blood samples from the patient.  I put an order to draw labs from lower extremities. _________________________________________________________  RN messaged again at approximately 1330.  Patient continues to have significant pain despite IV Dilaudid dose and is now extremely anxious, hypertensive, and vomiting.  She is complaining of blurry vision in her left eye.  Her husband states that because of her history of borderline personality disorder and PTSD that sometimes when she gets incredibly anxious her blood pressure spikes.  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  I went to assess the patient and also extensively spoke to her husband, Aaron Edelman, over the  phone.  The patient appears to be in significant distress at time of my arrival and is vomiting and crying out in pain.  Requesting repeat dose of IV Dilaudid and morphine.  She is stating that the pain is making her nauseous and continually vomit.  She said she feels incredibly anxious and is "dissociating" and feels like she is going to lose her left eye.  Her husband confirmed with me that she has had multiple episodes like this in the past and that sometimes when she becomes anxious regarding her pain or pain medicines that she has an extreme sympathetic response.  During 1 such episode couple months back, her blood pressure spiked to [id number] systolic and she experienced loss of vision in her right eye.  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  He says that when this happens at home, the main thing he does is control her anxiety first and then give blood pressure medicines or other medicines to control the sympathetic response as needed.  On my exam: BP 180s/130s, tachycardic ranging from 90s to 130s depending on her level of distress, tachypneic, still satting well on room air Appears severely anxious, crying, in acute distress.  She is tachycardic and regular rhythm.  Lungs still moving air.  She is clutching her lower abdomen in pain.  No clear visual field loss in left eye, pupils still reactive.  I think she vomited up much of her p.o. medicines this morning including her blood pressure medications and her morning dose of Klonopin.  It is possible  that she had a severe sympathetic response secondary to anxiety/panic attack and experienced a brief episode of severe symptomatic hypertension.  This sounds like this has happened in the past according to the patient and her husband.  Despite prior efforts to wean off narcotic medicines, I opted to treat her pain, blood pressure, and anxiety in the acute setting to prevent any morbidity.  Other  than dose of Dilaudid 1 mg which was given approximately 1 hour ago we also gave Ativan 1 mg and hydralazine 10 mg through the IV.    I reassessed her approximately 10 to 15 minutes afterwards and she was still constantly requesting medications for pain.  Specifically, she is requesting morphine as that has worked in the past.  She is still hypertensive, agitated, tearful at time of reassessment.  I opted to give 2 mg morphine IV.  I reassessed her again 10 to 15 minutes afterwards and she is more calm and does not appear to be in acute distress. BP dropped to 150s/90s. Vision is normal. She says that she is still nauseous and anxious and she is requesting more pain medication and anxiety medication.  Asked to give any IV PRNs for nausea that she is due and requested that she take her p.o. Klonopin 1 mg.  If she vomits again, she can get 1 mg IV Ativan.   I also asked for repeat bladder scan which was almost 900 cc.  Of note, by this point UA obtained earlier had returned and appear to be consistent with UTI.  Urine culture sent.  Initiated antibiotic treatment.  In the setting of UTI, I do not want to replace Foley catheter if possible.  I spoke to her about performing routine in and out catheterizations if needed after scheduled bladder scans and she is amenable at this time.  If she does not tolerate this, we can replace Foley.  But ideally, I would like to have antibiotics running for at least 24 hours before we replace Foley.  Overall, significant setback and attempts to wean off narcotic pain medicines.  It has been very difficult to do so as she is constantly requesting IV pain medicines and saying that they are the only things that give her some relief.  We will continue to avoid narcotics as possible and wean off.  I am hoping that treatment of UTI gives her some relief, but it is still unclear what exactly the etiology of her severe pain nausea and vomiting is.  Urology and OB/GYN were consulted  earlier in the admission and have signed off without any acute interventions planned during the hospital stay.  Low suspicion for celiac disease at this point in time GI working on other possibilities but I do not suspect a clear GI etiology of her pain.  She is being adequately treated for adrenal insufficiency at this point.  If it is in fact interstitial cystitis, the only solution for her would be to follow-up with the interstitial cystitis specialist at [place] in the outpatient setting.  I am hoping that we can discharge her in the coming days so she can have outpatient follow-up as described in my daily progress note.

## 2022-05-01 NOTE — Progress Notes (Signed)
Patient vomited all am meds including pain and anxiety medication. Notified Dr. Jodell Cipro. Given IV dilaudid at 1320 for pain and noted increased blood pressure 166/130. Blood pressure 188/152 and patient reported that her left eye sight is blurry. Spoke to husband on the phone and notified that patient lost her eyesight in her right eye after a hypertensive episode where her blood pressure was higher than now about [id number] systolic. Notified Dr. Jodell Cipro and requested that he call patients husband Aaron Edelman and review information. Given ativan '1mg'$  IV at 1358 and MS '2mg'$  at 1440. Dr. Jodell Cipro at patient bedside and evaluating condition during this time.

## 2022-05-01 NOTE — Progress Notes (Signed)
Mobility Specialist Progress Note:   05/01/22 1400  Mobility  Activity Stood at bedside;Dangled on edge of bed  Level of Assistance Minimal assist, patient does 75% or more  Assistive Device Other (Comment)  Distance Ambulated (ft)  (unable)  Activity Response Tolerated poorly (MD and RN in room)  Mobility Referral Yes  $Mobility charge 1 Mobility   Pt tearful throughout session d/t significant "bladder" pain per pt. Agreeable to stand to see if pain decr, unsuccessful. HR elevated to 150s, BP elevated as well and vision changes. MD in room to assess. Pt left sitting EOB with all needs met.   Nelta Numbers Mobility Specialist Please contact via SecureChat or  Rehab office at [phone]

## 2022-05-01 NOTE — Progress Notes (Addendum)
Foley removed this afternoon by dayshift and per report, pt voided once, 229m since removal.   This evening pt reports she tried to void twice and has been unable to and feels pressure in bladder area and pain 8/10. Bladder scan = 4190m Night MD notified. I&O cath done per order; 80022mlear yellow urine out.  Pt tolerated cath well then near end of cath being done started crying stating pain is up to 10/10. Says pain has been up to 10/10 like this off and on all day. Scheduled meds  and PRN clonazepam given per pt request. MD notified of pain. Toradol given per order. Pt sleeping, lightly snoring when RN returned to check on pain.  UPDATE: Around 0230, pt walked to bathroom. Voided 850m40mear yellow. Post void scan = 0ml.34mates she had to push to be able to void. Tearful and reports abd pain 9/10. Gave PRN oral dilaudid.   Pt asking why she can no longer get IV dilaudid. Reminded and re-educated pt of pain medication orders as discussed earlier in shift.

## 2022-05-01 NOTE — Progress Notes (Signed)
Progress Note   Subjective  Patient tearful, continues to have episodes of pain. She thinks related to when she urinates, having cath overnight made pain much worse. She moved bowels with suppository yesterday. She is not any better   Objective   Vital signs in last 24 hours: Temp:  [98 F (36.7 C)-98.6 F (37 C)] 98 F (36.7 C) (03/07 0619) Pulse Rate:  [61-87] 87 (03/07 0941) Resp:  [17-18] 18 (03/07 0941) BP: (133-137)/(93-95) 133/93 (03/07 0619) SpO2:  [95 %-96 %] 95 % (03/07 0941) Last BM Date : 04/30/22 General:    white female, tearful Psych:  Cooperative. Normal mood and affect.  Intake/Output from previous day: 03/06 0701 - 03/07 0700 In: 220 [P.O.:120; IV Piggyback:100] Out: [id number] [Urine:4150] Intake/Output this shift: No intake/output data recorded.  Lab Results: No results for input(s): "WBC", "HGB", "HCT", "PLT" in the last 72 hours. BMET Recent Labs    04/29/22 0238 04/30/22 0253  NA 136 138  K 3.8 4.0  CL 98 98  CO2 30 28  GLUCOSE 113* 86  BUN 12 13  CREATININE 1.01* 0.93  CALCIUM 9.4 9.5   LFT Recent Labs    04/29/22 0238  ALBUMIN 3.5   PT/INR No results for input(s): "LABPROT", "INR" in the last 72 hours.  Studies/Results: MR BRAIN W WO CONTRAST  Result Date: 04/29/2022 CLINICAL DATA:  Adrenal insufficiency EXAM: MRI HEAD WITHOUT AND WITH CONTRAST TECHNIQUE: Multiplanar, multiecho pulse sequences of the brain and surrounding structures were obtained without and with intravenous contrast. CONTRAST:  82m GADAVIST GADOBUTROL 1 MMOL/ML IV SOLN COMPARISON:  03/05/2022 MRI head FINDINGS: Brain: The pituitary gland and sella are normal in appearance. On precontrast imaging, a normal pituitary bright spot is seen. No focal mass is seen. The infundibulum is midline and normal in appearance. No restricted diffusion to suggest acute or subacute infarct. No abnormal parenchymal or meningeal enhancement. No acute hemorrhage, mass, mass effect, or  midline shift. No hydrocephalus or extra-axial collection. Normal craniocervical junction. Vascular: Normal arterial flow voids. Normal arterial and venous enhancement. Skull and upper cervical spine: Normal marrow signal. Sinuses/Orbits: Clear paranasal sinuses. No acute finding in the orbits. Other: The mastoid air cells are well aerated. IMPRESSION: 1. Normal appearance of the pituitary gland. No evidence of a pituitary mass. 2. No acute intracranial process. Electronically Signed   By: AMerilyn BabaM.D.   On: 04/29/2022 20:34       Assessment / Plan:    32y/o female here with the following:  Refractory suprapubic pain - post operative Nausea / vomiting Altered bowel habits  See prior notes for details. Ongoing intermittent pain for months made worse by recent pelvic surgery. Thought to have interstitial cystitis initially, Urology has exhausted therapies to treat that. CT scans have been unrevealing. Adrenal insufficiency suspected but no improvement with treatment for that.  Discussed ddx with her. 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 I think it is unlikely that her colon or GI tract is causing her pain, but it is the only place we haven't looked yet, in her colon. She has some baseline loose stools, more recently constipated on narcotics. I don't think she can tolerate a bowel prep with her nausea / vomiting. I offered her a flex sig to clear her lower colon - explained what that was with her. I set realistic expectations that I think  it is unlikely to show a clear source for her symptoms, but she is rather adamant she wants it done to make sure everything is okay in light of the location of her pain. Tentatively have her scheduled for flex sig tomorrow AM. Will do enemas beforehand and dulcolax to prep for it. Recall she has had a prior EGD in recent years which looked okay.  Can't test for gastroparesis currently in setting of narcotics, but nausea / vomiting seem to be worst when her pain is at its worst. MRI brain okay.  Otherwise, have discussed that celiac disease is unlikely at this point and would not be expected to cause these symptoms. We have screened her for porphyria given this presentation, that is quite rare and expect to be negative, but will await urine porphobilinogen. On antiemetics for nausea / vomiting, I think narcotics making this worse, very difficult situation. Would ideally get her off narcotics.  PLAN: - flex sig tomorrow as above - await pending labs - continue supportive care / antiemetics, pain control, try to wean narcotics.  Jolly Mango, MD [facility]

## 2022-05-01 NOTE — H&P (View-Only) (Signed)
    Progress Note   Subjective  Patient tearful, continues to have episodes of pain. She thinks related to when she urinates, having cath overnight made pain much worse. She moved bowels with suppository yesterday. She is not any better   Objective   Vital signs in last 24 hours: Temp:  [98 F (36.7 C)-98.6 F (37 C)] 98 F (36.7 C) (03/07 0619) Pulse Rate:  [61-87] 87 (03/07 0941) Resp:  [17-18] 18 (03/07 0941) BP: (133-137)/(93-95) 133/93 (03/07 0619) SpO2:  [95 %-96 %] 95 % (03/07 0941) Last BM Date : 04/30/22 General:    white female, tearful Psych:  Cooperative. Normal mood and affect.  Intake/Output from previous day: 03/06 0701 - 03/07 0700 In: 220 [P.O.:120; IV Piggyback:100] Out: 4150 [Urine:4150] Intake/Output this shift: No intake/output data recorded.  Lab Results: No results for input(s): "WBC", "HGB", "HCT", "PLT" in the last 72 hours. BMET Recent Labs    04/29/22 0238 04/30/22 0253  NA 136 138  K 3.8 4.0  CL 98 98  CO2 30 28  GLUCOSE 113* 86  BUN 12 13  CREATININE 1.01* 0.93  CALCIUM 9.4 9.5   LFT Recent Labs    04/29/22 0238  ALBUMIN 3.5   PT/INR No results for input(s): "LABPROT", "INR" in the last 72 hours.  Studies/Results: MR BRAIN W WO CONTRAST  Result Date: 04/29/2022 CLINICAL DATA:  Adrenal insufficiency EXAM: MRI HEAD WITHOUT AND WITH CONTRAST TECHNIQUE: Multiplanar, multiecho pulse sequences of the brain and surrounding structures were obtained without and with intravenous contrast. CONTRAST:  10mL GADAVIST GADOBUTROL 1 MMOL/ML IV SOLN COMPARISON:  03/05/2022 MRI head FINDINGS: Brain: The pituitary gland and sella are normal in appearance. On precontrast imaging, a normal pituitary bright spot is seen. No focal mass is seen. The infundibulum is midline and normal in appearance. No restricted diffusion to suggest acute or subacute infarct. No abnormal parenchymal or meningeal enhancement. No acute hemorrhage, mass, mass effect, or  midline shift. No hydrocephalus or extra-axial collection. Normal craniocervical junction. Vascular: Normal arterial flow voids. Normal arterial and venous enhancement. Skull and upper cervical spine: Normal marrow signal. Sinuses/Orbits: Clear paranasal sinuses. No acute finding in the orbits. Other: The mastoid air cells are well aerated. IMPRESSION: 1. Normal appearance of the pituitary gland. No evidence of a pituitary mass. 2. No acute intracranial process. Electronically Signed   By: Alison  Vasan M.D.   On: 04/29/2022 20:34       Assessment / Plan:    31 y/o female here with the following:  Refractory suprapubic pain - post operative Nausea / vomiting Altered bowel habits  See prior notes for details. Ongoing intermittent pain for months made worse by recent pelvic surgery. Thought to have interstitial cystitis initially, Urology has exhausted therapies to treat that. CT scans have been unrevealing. Adrenal insufficiency suspected but no improvement with treatment for that.  Discussed ddx with her. 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 I think it is unlikely that her colon or GI tract is causing her pain, but it is the only place we haven't looked yet, in her colon. She has some baseline loose stools, more recently constipated on narcotics. I don't think she can tolerate a bowel prep with her nausea / vomiting. I offered her a flex sig to clear her lower colon - explained what that was with her. I set realistic expectations that I think   it is unlikely to show a clear source for her symptoms, but she is rather adamant she wants it done to make sure everything is okay in light of the location of her pain. Tentatively have her scheduled for flex sig tomorrow AM. Will do enemas beforehand and dulcolax to prep for it. Recall she has had a prior EGD in recent years which looked okay.  Can't test for gastroparesis currently in setting of narcotics, but nausea / vomiting seem to be worst when her pain is at its worst. MRI brain okay.  Otherwise, have discussed that celiac disease is unlikely at this point and would not be expected to cause these symptoms. We have screened her for porphyria given this presentation, that is quite rare and expect to be negative, but will await urine porphobilinogen. On antiemetics for nausea / vomiting, I think narcotics making this worse, very difficult situation. Would ideally get her off narcotics.  PLAN: - flex sig tomorrow as above - await pending labs - continue supportive care / antiemetics, pain control, try to wean narcotics.  Steve Vincient Vanaman, MD [facility]  

## 2022-05-01 NOTE — Progress Notes (Signed)
Subjective:   Summary: Krystal [name] is a 32 y.o. year old female currently admitted on the IMTS HD#6 for bradycardia.  Overnight Events: significant diuresis overnight. TOV yesterday, required one in and out, voided large volume second time with 0cc PVR. Significant pain when she voids or valsalvas, given one time PRN toradol. No documented emesis overnight  On assessment this morning: Had more pain when urinating. Phenergan has been helping with controlling nausea. No episodes of emesis. Has been trying to eat but hurts when she eats. Has been trying to maintain PO intake but not drinking as much as usual but still producing a lot of urine. Having some burning with urination. Still having pain.  We spoke to her and her husband regarding the overall plan for this admission including the ongoing diagnostic workup, results of adrenal insufficiency workup, celiac serologies and neck steps.  Specifically, we need to wean her off narcotics as I think this is doing more harm than good and discharge so she can follow-up with multiple specialist in the outpatient setting.  Discussed how the only barrier to discharge now is her ongoing pain.  She is tearful and still requesting IV pain medicines, but is willing to try to wean today.    Objective:  Vital signs in last 24 hours: Vitals:   04/30/22 0715 04/30/22 0744 04/30/22 2043 05/01/22 0619  BP:  (!) 153/113 (!) 137/95 (!) 133/93  Pulse: (!) 57 60 79 61  Resp:  '17 17 18  '$ Temp:  98 F (36.7 C) 98.6 F (37 C) 98 F (36.7 C)  TempSrc:  Oral Oral Oral  SpO2: 96% 98% 96%   Weight:      Height:       Supplemental O2: Room Air SpO2: 96 %   Physical Exam:  General: uncomfortable appearing young female, laying in bed CV: RRR, no m/r/g. Pulm: normal WOB on RA. Abdomen: soft, nondistended. Tender to palpation of suprapubic and LLQ.  hypoactive bowel sounds. MSK: s/p amputation of L hand, R wrist reconstructed with healed  surgical scar. Skin: warm and dry. Neuro: AAOx3, no focal deficits, moving all extremities spontaneously.   Filed Weights   04/23/22 1750  Weight: 108 kg     Intake/Output Summary (Last 24 hours) at 05/01/2022 [id number] Last data filed at 05/01/2022 0230 Gross per 24 hour  Intake 220 ml  Output 3750 ml  Net -3530 ml    Net IO Since Admission: -16,336.21 mL [05/01/22 0632]  Pertinent Labs:    Latest Ref Rng & Units 04/27/2022    3:52 AM 04/25/2022   11:02 AM 04/24/2022    2:15 AM  CBC  WBC 4.0 - 10.5 K/uL 9.6  5.6  9.2   Hemoglobin 12.0 - 15.0 g/dL 14.4  12.7  12.4   Hematocrit 36.0 - 46.0 % 41.6  37.1  36.1   Platelets 150 - 400 K/uL 337  289  262        Latest Ref Rng & Units 04/30/2022    2:53 AM 04/29/2022    2:38 AM 04/28/2022    9:36 AM  CMP  Glucose 70 - 99 mg/dL 86  113  117   BUN 6 - 20 mg/dL '13  12  14   '$ Creatinine 0.44 - 1.00 mg/dL 0.93  1.01  1.02   Sodium 135 - 145 mmol/L 138  136  134   Potassium 3.5 -  5.1 mmol/L 4.0  3.8  4.3   Chloride 98 - 111 mmol/L 98  98  99   CO2 22 - 32 mmol/L '28  30  28   '$ Calcium 8.9 - 10.3 mg/dL 9.5  9.4  9.1     Imaging: CT abdomen IMPRESSION: 1. Foci of free air in the upper abdomen, umbilicus, and low anterior abdominal wall, likely related to history of recent surgery. No hematoma or abscess is seen. 2. Trace amount of free fluid in the cul-de-sac. 3. Hepatic steatosis. 4. Foley catheter in the urinary bladder.     EKG: NSR rates 60-80 this morning  Assessment/Plan:   Principal Problem:   Adrenal insufficiency (HCC) Active Problems:   Hypothyroidism due to Hashimoto's thyroiditis   Bradycardia   Abdominal pain, chronic, right lower quadrant   Post-operative pain   Primary hypertension   Nausea and vomiting   Lower abdominal pain   Chronic diarrhea   Patient Summary: [name] is a 32 y.o. 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   Adrenal Insufficiency Chronic abdominal pain Nausea/vomiting Primary reason for continued admission remains for nausea, pain.  Her nausea seems to be controlled this morning on Phenergan and Zofran.  She is tearful regarding her pain regimen and still requesting IV narcotics for treatment, but we have extensively discussed this and she understands the reason why we need to wean her off of these medicines.  She is okay to try to wean off of the p.o. Dilaudid today.  I think oxycodone is making her more nauseous so I do not think this is a good medication for the future.  Very difficult to think of alternative treatments for her pain as it is unclear exactly what the etiology is at this point.  Given the persistent dysuria and suprapubic tenderness, I do think it might be useful to recheck urine studies.  She is also having significant diuresis.  Regarding her adrenal insufficiency, with negative prolactin and other pituitary hormone studies and negative MRI, I think it is unlikely that her adrenal insufficiency is secondary in nature.  She also to has not taken significant steroids before this admission so I do not think it is tertiary.  A primary autoimmune adrenalitis or something similar would fit given her overall clinical picture.  We have discussed this with Dr. Buddy Duty and sent out 21-hydroxylase and renin/Aldosterone level.  He agreed that especially without clinical manifestations of mineralocorticoid deficiency, there is no role of adding fludrocortisone to her current regimen of hydrocortisone 10 mg p.o. daily.  I do not think the adrenal insufficiency is a significant contributor to her abdominal pain at this point.  Very unlikely she has celiac with multiple negative serologies.  GI working up for other possible enteropathy's and porphyria.  I do not think a vasculitis  workup would be very fruitful and regardless she is currently on steroids.  Interstitial cystitis remains a possible primary contributor to her pain, but urology has signed off and is not offering anything in the inpatient setting.  OB/GYN has assessed her and also signed off.  I do not know if we have much more to offer in terms of diagnostics during this admission.  Primary goal will be to try to control her pain a little bit better and then discharge to home for outpatient follow-up with multiple  specialists. -Appreciate GI recommendations regarding possible enteropathy, possible gastroparesis. - Phenergan 12.5 every 6h as needed for nausea.  Zofran 4 mg every 6 hours for nausea.  Will periodically check QTc's.  Of note, she has documented reactions to Reglan and Compazine. -P.o. hydrocortisone 10 mg twice daily for adrenal insufficiency -Tylenol '1000mg'$  TID, p.o. Dilaudid 4 mg every 6 hours as needed.  Lidocaine patch, heat/ice.  Will try to wean off Dilaudid. -Increase gabapentin to 300 mg 3 times daily.  Of note, this medicine did have warning for red dye but she takes it at home without any reaction. -Starting oxybutynin 5 BID - Follow-up 21-hydroxylase, renin/Aldo level -Miralax daily, dulcolax prn -dysphagia 3 diet, Ensure, gluten-free diet  -will need to establish with endocrinology as outpatient and also follow-up with GI and urology.  Sinus bradycardia Rates continue to improve, remains NSR this morning. More reactive with pain/discomfort compared to prior. -appreciate cardiology management -hold propranolol, clonidine -telemetry -if HR fails to improve, consider EP consult -intervention with pacing and possible atropine if symptomatic bradycardia with acute BP drop or change in mentation   Urinary retention Possible interstitial cystitis Urinary retention thought to be anesthetic complication after recent lap ovarian cystectomy. She failed voiding trial, requiring reinsertion of  foley catheter 04/26/2022. She has undergone extensive workup with urology and has failed multiple outpatient therapies, including intravesical local anesthetic. She was referred to Dr. Amalia Hailey at [facility] for further workup, but has not yet been seen by him. Unclear if there is a component of interstitial cystitis at play given experienced more discomfort when foley was removed. Discussed with urology, no acute hospital interventions required. Will need outpatient urology follow up, especially if needing to be discharged with foley.  Ongoing trial of void.  She required 1 in and out cath, but second PVR was 0 cc.  Will continue to try and urinate off of Foley catheter for now. -DC Foley.  PVRs.  If she retains for 2 subsequent bladder scans, replace Foley. - Trying oxybutynin 5 BID as above -Interstitial cystitis pain management as above.  She will need follow-up with urology outpatient. - Follow-up UA/culture   Resistant Hypertension Now on amlodipine 10, irbesartan 300, chlorthalidone 25, still mildly hypertensive.  Will CTM.  Do not want to put her on spironolactone given there is still some concern for primary adrenal insufficiency. -Appreciate cardiology management -holding clonidine, propanolol  -IV hydralazine 10 mg as needed for SBP >160   Hyponatremia Resolved, will CTM.  Of note she still diuresed >4L today.  GERD Takes pepcid at home. Likely worsened here with initiation of steroid therapy. Starting PPI BID. Watch for worsening symptoms after starting NSAIDs -PO protonix '40mg'$  BID  Hashimoto thyroiditis Synthroid increased.  Adrenal insufficiency management as above. -Synthroid 150 mcg daily -Repeat TSH mid April 2024   Anxiety/depression Borderline personality disorder Patient reports he is currently being tapered off venlafaxine with uptitrating of her Prozac. Has significant trauma from recent medical events. Expressed concerns about mood to consulting cardiologist, including feelings  of worthlessness and wish to be changed to DNR/DNI. Discussed with psychiatry via phone call, patient is noted to have borderline personality and splitting. They stated that she does not have a history of suicide attempts and believe her risk to be low. Advised to continue current medications and follow up with outpatient psychiatry. -Venlafaxine 75 mg daily -Prozac 20 mg daily -Latuda 20 mg daily -Clonazepam 1 mg TID PRN for anxiety - f/u with outpatient psychiatry - chaplain services ordered  Asthma Respiratory status stable. No wheezing or rales on exam. Home regimen includes Symbicort and PRN Ventolin. -Resume as needed albuterol inhaler -Dulera 2 puffs twice daily  Diet:  dysphagia 3 IVF: None VTE: Enoxaparin Code: Full PT/OT recs: Pending  Dispo: Pending clinical improvement  Linus Galas, MD Internal medicine PGY 1 Pager: [phone]  Please contact the on call pager after 5 pm and on weekends at [phone].

## 2022-05-02 ENCOUNTER — Encounter (HOSPITAL_COMMUNITY)
Admission: EM | Disposition: A | Discharge status: Home / Self Care | Payer: Self-pay | Source: Home / Self Care | Attending: Internal Medicine

## 2022-05-02 ENCOUNTER — Inpatient Hospital Stay (HOSPITAL_COMMUNITY): Payer: Self-pay | Admitting: Anesthesiology

## 2022-05-02 ENCOUNTER — Inpatient Hospital Stay: Payer: Self-pay

## 2022-05-02 DIAGNOSIS — F418 Other specified anxiety disorders: Secondary | ICD-10-CM

## 2022-05-02 DIAGNOSIS — R1030 Lower abdominal pain, unspecified: Secondary | ICD-10-CM

## 2022-05-02 DIAGNOSIS — K648 Other hemorrhoids: Secondary | ICD-10-CM

## 2022-05-02 DIAGNOSIS — I10 Essential (primary) hypertension: Secondary | ICD-10-CM

## 2022-05-02 DIAGNOSIS — J45909 Unspecified asthma, uncomplicated: Secondary | ICD-10-CM

## 2022-05-02 HISTORY — PX: FLEXIBLE SIGMOIDOSCOPY: SHX5431

## 2022-05-02 HISTORY — PX: BIOPSY: SHX5522

## 2022-05-02 LAB — RENAL FUNCTION PANEL
Albumin: 3.7 g/dL (ref 3.5–5.0)
Anion gap: 11 (ref 5–15)
BUN: 13 mg/dL (ref 6–20)
CO2: 29 mmol/L (ref 22–32)
Calcium: 9.5 mg/dL (ref 8.9–10.3)
Chloride: 97 mmol/L — ABNORMAL LOW (ref 98–111)
Creatinine, Ser: 0.96 mg/dL (ref 0.44–1.00)
GFR, Estimated: >60 mL/min (ref >60)
Glucose, Bld: 89 mg/dL (ref 70–99)
Phosphorus: 4 mg/dL (ref 2.5–4.6)
Potassium: 3.9 mmol/L (ref 3.5–5.1)
Sodium: 137 mmol/L (ref 135–145)

## 2022-05-02 LAB — PORPHOBILINOGEN, RANDOM URINE: Quantitative Porphobilinogen: 0 mg/L (ref 0.0–2.0)

## 2022-05-02 LAB — CBC
HCT: 44.6 % (ref 36.0–46.0)
Hemoglobin: 15.5 g/dL — ABNORMAL HIGH (ref 12.0–15.0)
MCH: 32.4 pg (ref 26.0–34.0)
MCHC: 34.8 g/dL (ref 30.0–36.0)
MCV: 93.3 fL (ref 80.0–100.0)
Platelets: 330 10*3/uL (ref 150–400)
RBC: 4.78 MIL/uL (ref 3.87–5.11)
RDW: 12.5 % (ref 11.5–15.5)
WBC: 10.5 10*3/uL (ref 4.0–10.5)
nRBC: 0 % (ref 0.0–0.2)

## 2022-05-02 LAB — GLUCOSE, CAPILLARY
Glucose-Capillary: 102 mg/dL — ABNORMAL HIGH (ref 70–99)
Glucose-Capillary: 98 mg/dL (ref 70–99)
Glucose-Capillary: 133 mg/dL — ABNORMAL HIGH (ref 70–99)

## 2022-05-02 SURGERY — SIGMOIDOSCOPY, FLEXIBLE
Anesthesia: Monitor Anesthesia Care

## 2022-05-02 MED ORDER — SODIUM CHLORIDE 0.9% FLUSH
10.0000 mL | Freq: Two times a day (BID) | INTRAVENOUS | Status: DC
  Administered 2022-05-02 – 2022-05-05 (×6): 10 mL

## 2022-05-02 MED ORDER — LORAZEPAM 2 MG/ML IJ SOLN
1.0000 mg | Freq: Once | INTRAMUSCULAR | Status: AC
  Administered 2022-05-02: 1 mg via INTRAVENOUS
  Filled 2022-05-02: qty 1

## 2022-05-02 MED ORDER — MELATONIN 5 MG PO TABS
5.0000 mg | ORAL_TABLET | Freq: Every day | ORAL | Status: DC
  Administered 2022-05-02: 5 mg via ORAL
  Filled 2022-05-02: qty 1

## 2022-05-02 MED ORDER — DEXMEDETOMIDINE HCL IN NACL 80 MCG/20ML IV SOLN
INTRAVENOUS | Status: DC | PRN
  Administered 2022-05-02 (×3): 8 ug via BUCCAL

## 2022-05-02 MED ORDER — CHLORHEXIDINE GLUCONATE CLOTH 2 % EX PADS
6.0000 | MEDICATED_PAD | Freq: Every day | CUTANEOUS | Status: DC
  Administered 2022-05-02 – 2022-05-04 (×3): 6 via TOPICAL

## 2022-05-02 MED ORDER — MORPHINE SULFATE (PF) 2 MG/ML IV SOLN: 2.0000 mg | Freq: Once | INTRAVENOUS | Status: AC

## 2022-05-02 MED ORDER — LORAZEPAM 2 MG/ML IJ SOLN
INTRAMUSCULAR | Status: AC
  Administered 2022-05-02: 1 mg via INTRAVENOUS
  Filled 2022-05-02: qty 1

## 2022-05-02 MED ORDER — ONDANSETRON HCL 4 MG/2ML IJ SOLN
4.0000 mg | Freq: Four times a day (QID) | INTRAMUSCULAR | Status: DC | PRN
  Administered 2022-05-02 – 2022-05-04 (×6): 4 mg via INTRAVENOUS
  Filled 2022-05-02 (×6): qty 2

## 2022-05-02 MED ORDER — SODIUM CHLORIDE 0.9% FLUSH: 10.0000 mL | INTRAVENOUS | Status: DC | PRN

## 2022-05-02 MED ORDER — LACTATED RINGERS IV SOLN: INTRAVENOUS | Status: DC

## 2022-05-02 MED ORDER — PROPOFOL 500 MG/50ML IV EMUL
INTRAVENOUS | Status: DC | PRN
  Administered 2022-05-02: 200 ug/kg/min via INTRAVENOUS

## 2022-05-02 MED ORDER — SODIUM CHLORIDE 0.9 % IV SOLN
12.5000 mg | Freq: Three times a day (TID) | INTRAVENOUS | Status: DC | PRN
  Administered 2022-05-02: 12.5 mg via INTRAVENOUS
  Filled 2022-05-02: qty 0.5

## 2022-05-02 MED ORDER — PROPOFOL 10 MG/ML IV BOLUS
INTRAVENOUS | Status: DC | PRN
  Administered 2022-05-02: 50 mg via INTRAVENOUS
  Administered 2022-05-02: 30 mg via INTRAVENOUS

## 2022-05-02 MED ORDER — MORPHINE SULFATE (PF) 2 MG/ML IV SOLN
INTRAVENOUS | Status: AC
  Administered 2022-05-02: 2 mg via INTRAVENOUS
  Filled 2022-05-02: qty 1

## 2022-05-02 MED ORDER — LORAZEPAM 2 MG/ML IJ SOLN: 1.0000 mg | Freq: Once | INTRAMUSCULAR | Status: AC

## 2022-05-02 NOTE — Progress Notes (Addendum)
Patient complained of 10/10 abdominal pain after voiding. Pt is also nauseous and said she vomited. Requested for IV pain med instead of po. MD advised to try po meds first. Gave IV Phenergan and then po oxycodone and Tylenol. Patient vomited meds within 15 mins of administration. Contacted MD that ordered 1 time dose of IV Ativan. Unable to give rest of pm meds due to patient vomiting.

## 2022-05-02 NOTE — Anesthesia Preprocedure Evaluation (Addendum)
Anesthesia Evaluation  Patient identified by MRN, date of birth, ID band Patient awake    Reviewed: Allergy & Precautions, NPO status , Patient's Chart, lab work & pertinent test results  Airway Mallampati: II  TM Distance: >3 FB Neck ROM: Full    Dental no notable dental hx.    Pulmonary asthma    Pulmonary exam normal        Cardiovascular hypertension, Pt. on medications and Pt. on home beta blockers Normal cardiovascular exam     Neuro/Psych  Headaches PSYCHIATRIC DISORDERS Anxiety Depression     Neuromuscular disease    GI/Hepatic Neg liver ROS,GERD  Medicated and Controlled,,  Endo/Other  Hypothyroidism  Adrenal Insufficiency  Renal/GU Renal diseaseinterstitial cystitis     Musculoskeletal  (+) Arthritis ,  Fibromyalgia -  Abdominal   Peds  Hematology negative hematology ROS (+)   Anesthesia Other Findings lower abdominal pain  Reproductive/Obstetrics                             Anesthesia Physical Anesthesia Plan  ASA: 2  Anesthesia Plan: MAC   Post-op Pain Management:    Induction: Intravenous  PONV Risk Score and Plan: 2 and Propofol infusion and Treatment may vary due to age or medical condition  Airway Management Planned: Simple Face Mask  Additional Equipment:   Intra-op Plan:   Post-operative Plan:   Informed Consent: I have reviewed the patients History and Physical, chart, labs and discussed the procedure including the risks, benefits and alternatives for the proposed anesthesia with the patient or authorized representative who has indicated his/her understanding and acceptance.     Dental advisory given  Plan Discussed with: CRNA  Anesthesia Plan Comments:        Anesthesia Quick Evaluation

## 2022-05-02 NOTE — Anesthesia Procedure Notes (Signed)
Procedure Name: MAC Date/Time: 05/02/2022 11:47 AM  Performed by: Darletta Moll, CRNAPre-anesthesia Checklist: Patient identified, Emergency Drugs available, Suction available and Patient being monitored Patient Re-evaluated:Patient Re-evaluated prior to induction Oxygen Delivery Method: Simple face mask

## 2022-05-02 NOTE — Transfer of Care (Signed)
Immediate Anesthesia Transfer of Care Note  Patient: Kareemah C Symanski  Procedure(s) Performed: FLEXIBLE SIGMOIDOSCOPY BIOPSY  Patient Location: Endoscopy Unit  Anesthesia Type:MAC  Level of Consciousness: drowsy and patient cooperative  Airway & Oxygen Therapy: Patient Spontanous Breathing  Post-op Assessment: Report given to RN, Post -op Vital signs reviewed and stable, and Patient moving all extremities X 4  Post vital signs: Reviewed and stable  Last Vitals:  Vitals Value Taken Time  BP    Temp    Pulse 78   Resp 20   SpO2 97     Last Pain:  Vitals:   05/02/22 1030  TempSrc: Temporal  PainSc: 8       Patients Stated Pain Goal: 2 ([id number])  Complications: No notable events documented.

## 2022-05-02 NOTE — Op Note (Signed)
[facility] Patient Name: Krystal [name] Procedure Date : 05/02/2022 MRN: [id number] Attending MD: Carlota Raspberry. Havery Moros , MD, [id number] Date of Birth: 08-02-1990 CSN: [id number] Age: 32 Admit Type: Inpatient Procedure:                Flexible Sigmoidoscopy Indications:              Refractory pelvic / lower abdominal pain, nausea /                            vomiting. CT scans negative. Has history of                            suspected interstitial cystitis but has failed                            numerous options to treat that. Dx with adrenal                            insufficiency but has not improved with treatment.                            Has baseline loose stools, more altered lately                            while on narcotics. Flex sig to clear colon for                            source of lower abdominal pain. Providers:                Carlota Raspberry. Havery Moros, MD, Altamese Cabal, RN,                            Benetta Spar, Technician Referring MD:              Medicines:                Monitored Anesthesia Care Complications:            No immediate complications. Estimated blood loss:                            Minimal. Estimated Blood Loss:     Estimated blood loss was minimal. Procedure:                Pre-Anesthesia Assessment:                           - Prior to the procedure, a History and Physical                            was performed, and patient medications and                            allergies were reviewed. The patient's tolerance of  previous anesthesia was also reviewed. The risks                            and benefits of the procedure and the sedation                            options and risks were discussed with the patient.                            All questions were answered, and informed consent                            was obtained. Prior Anticoagulants: The patient has                             taken Xarelto (rivaroxaban), last dose was 1 day                            prior to procedure. ASA Grade Assessment: II - A                            patient with mild systemic disease. After reviewing                            the risks and benefits, the patient was deemed in                            satisfactory condition to undergo the procedure.                           After obtaining informed consent, the scope was                            passed under direct vision. The [id number]                            [id number]) Olympus peds colonoscope was introduced                            through the anus and advanced to the the sigmoid                            colon. The flexible sigmoidoscopy was accomplished                            without difficulty. The patient tolerated the                            procedure well. The quality of the bowel                            preparation was fair. Scope In: Scope Out: Findings:      The perianal and digital rectal  examinations were normal.      A large amount of semi-solid stool was found in the recto-sigmoid colon       and in the sigmoid colon, making visualization difficult. Lavage of the       area was performed using a large amount of sterile water, resulting in       incomplete clearance with fair visualization.      Internal hemorrhoids were found during retroflexion. The hemorrhoids       were small.      The exam was otherwise without abnormality of what was visualized. The       rectum, rectosigmoid colon, and distal half of the sigmoid colon are       normal - no cause for pain. Stool prohibited some views however lavaged       extensively and nothing concerning noted.      Biopsies for histology were taken with a cold forceps for evaluation of       microscopic colitis given baseline loose stools. Impression:               - Preparation of the colon was fair at best.                           - Internal  hemorrhoids.                           - The examination was otherwise normal.                           - Biopsies were taken with a cold forceps for                            evaluation of microscopic colitis.                           No cause for her pain on this exam. See last                            inpatient progress note for full thoughts from our                            perspective. I do not think her pain is related to                            her GI tract, more so likely her bladder and                            perhaps neuropathic / visceral hypersensitivity.                            Would try to minimize narcotic use and treat with                            alternatives if possible. Difficult situation.  Awaiting porphyria workup, continue to treat                            adrenal insufficiency. Recommendation:           - Return patient to hospital ward for ongoing care.                           - Advance diet as tolerated.                           - Continue present medications.                           - Await pathology results, should be back next week.                           - Nothing further to add from GI perspective                            currently. Await pending labs. We will sign off for                            now, but call if questions moving forward. Procedure Code(s):        --- Professional ---                           [phone], Sigmoidoscopy, flexible; with biopsy, single                            or multiple Diagnosis Code(s):        --- Professional ---                           K64.8, Other hemorrhoids                           R10.30, Lower abdominal pain, unspecified CPT copyright 2022 [employer]. All rights reserved. The codes documented in this report are preliminary and upon coder review may  be revised to meet current compliance requirements. [name], MD 05/02/2022  12:04:20 PM This report has been signed electronically. Number of Addenda: 0

## 2022-05-02 NOTE — Progress Notes (Signed)
Peripherally Inserted Central Catheter Placement  The IV Nurse has discussed with the patient and/or persons authorized to consent for the patient, the purpose of this procedure and the potential benefits and risks involved with this procedure.  The benefits include less needle sticks, lab draws from the catheter, and the patient may be discharged home with the catheter. Risks include, but not limited to, infection, bleeding, blood clot (thrombus formation), and puncture of an artery; nerve damage and irregular heartbeat and possibility to perform a PICC exchange if needed/ordered by physician.  Alternatives to this procedure were also discussed.  Bard Power PICC patient education guide, fact sheet on infection prevention and patient information card has been provided to patient /or left at bedside.    PICC Placement Documentation  PICC Double Lumen [id number] Right Basilic 39 cm 0 cm (Active)  Indication for Insertion or Continuance of Line Poor Vasculature-patient has had multiple peripheral attempts or PIVs lasting less than 24 hours 05/02/22 2022  Exposed Catheter (cm) 0 cm 05/02/22 2022  Site Assessment Clean, Dry, Intact 05/02/22 2022  Lumen #1 Status Flushed;Saline locked;Blood return noted 05/02/22 2022  Lumen #2 Status Flushed;Saline locked;Blood return noted 05/02/22 2022  Dressing Status Antimicrobial disc in place 05/02/22 2022  Safety Lock Not Applicable [id number]  Line Care Connections checked and tightened 05/02/22 2022  Line Adjustment (NICU/IV Team Only) No 05/02/22 2022  Dressing Intervention New dressing 05/02/22 2022  Dressing Change Due 05/09/22 05/02/22 2022       Quin Hoop 05/02/2022, 8:23 PM

## 2022-05-02 NOTE — Progress Notes (Signed)
Subjective:   Summary: Krystal [name] is a 32 y.o. year old female currently admitted on the IMTS HD#7 for bradycardia.  Overnight Events: significant diuresis overnight. Bladder scan 716cc, getting in and outs in the setting of UTI. Significant pain when she voids or valsalvas. Given PO PRN overnight, had multiple emesis episodes.    She was prepped this morning and went for flex sig.  No significant findings, biopsies taken.  She has been feeling somnolent after coming back to her room. She is still vomiting up intermittently. Phenergan is making her more loopy. She is feeling nauseous at this time. No chest pain, SHOB. Stomach pain is in the same place. Has been having constant pain since the IV pain medications wore off. Hurting more when she urinates. Not having burning with urination today. Vision is back to normal. She had a BM last night and this morning.  We had a long discussion with her and her husband regarding the workup so far during this admission and then next steps.  Answered all questions.  Objective:  Vital signs in last 24 hours: Vitals:   05/01/22 1518 05/01/22 1948 05/01/22 2149 05/02/22 0612  BP: (!) 144/93 (!) 133/93  130/88  Pulse:  88    Resp:  19    Temp:  98.2 F (36.8 C)  98.2 F (36.8 C)  TempSrc:  Oral  Oral  SpO2:  95% 98%   Weight:    105.1 kg  Height:       Supplemental O2: Room Air SpO2: 98 %   Physical Exam:  General: uncomfortable appearing young female, laying in bed CV: RRR, no m/r/g. Pulm: normal WOB on RA. Abdomen: soft, nondistended. Tenderness to palpation of suprapubic region and LLQ.  hypoactive bowel sounds. MSK: s/p amputation of L hand, R wrist reconstructed with healed surgical scar. Skin: warm and dry. Neuro: AAOx3, no focal deficits, moving all extremities spontaneously.   Filed Weights   04/23/22 1750 05/02/22 0612  Weight: 108 kg 105.1 kg     Intake/Output Summary (Last 24 hours) at 05/02/2022  0723 Last data filed at 05/02/2022 [id number] Gross per 24 hour  Intake 255.56 ml  Output 3200 ml  Net -2944.44 ml    Net IO Since Admission: -19,680.65 mL [05/02/22 0723]  Pertinent Labs:    Latest Ref Rng & Units 05/02/2022    7:06 AM 05/01/2022    2:47 PM 04/27/2022    3:52 AM  CBC  WBC 4.0 - 10.5 K/uL 10.5  15.8  9.6   Hemoglobin 12.0 - 15.0 g/dL 15.5  15.4  14.4   Hematocrit 36.0 - 46.0 % 44.6  43.6  41.6   Platelets 150 - 400 K/uL 330  353  337        Latest Ref Rng & Units 05/01/2022    2:47 PM 04/30/2022    2:53 AM 04/29/2022    2:38 AM  CMP  Glucose 70 - 99 mg/dL 85  86  113   BUN 6 - 20 mg/dL '16  13  12   '$ Creatinine 0.44 - 1.00 mg/dL 0.99  0.93  1.01   Sodium 135 - 145 mmol/L 135  138  136   Potassium 3.5 - 5.1 mmol/L 4.2  4.0  3.8   Chloride 98 - 111 mmol/L 96  98  98   CO2 22 - 32 mmol/L 25  28  30  Calcium 8.9 - 10.3 mg/dL 9.4  9.5  9.4     Imaging: CT abdomen IMPRESSION: 1. Foci of free air in the upper abdomen, umbilicus, and low anterior abdominal wall, likely related to history of recent surgery. No hematoma or abscess is seen. 2. Trace amount of free fluid in the cul-de-sac. 3. Hepatic steatosis. 4. Foley catheter in the urinary bladder.     EKG: NSR rates 60-80 this morning  Assessment/Plan:   Principal Problem:   Adrenal insufficiency (HCC) Active Problems:   Hypothyroidism due to Hashimoto's thyroiditis   Bradycardia   Abdominal pain, chronic, right lower quadrant   Post-operative pain   Primary hypertension   Nausea and vomiting   Lower abdominal pain   Chronic diarrhea   Patient Summary: Krystal [name] is a 32 y.o. 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   Adrenal Insufficiency Chronic abdominal pain Nausea/vomiting Please see event  note from yesterday for interval events.  Goal now remains to control pain and nausea without narcotics if possible.  Have exhausted most high yield diagnostic possibilities for ongoing abdominal pain.  We are currently continuing to treat her adrenal insufficiency.  Clear GI etiology is ruled out with flex sig today.  Low likelihood of celiac disease.  OB/GYN and urology signed off.  She needs to follow-up outpatient with interstitial cystitis expert.  She is on a lot of medicines that are providing unclear clinical benefit.  Will cut down today. -Appreciate GI recommendations regarding possible enteropathy, possible gastroparesis. - Zofran 4 mg every 6 hours as needed, spacing out Phenergan to 12.5 mg every 8 hours for breakthrough.  Considering adding erythromycin empirically for gastroparesis treatment.  Will periodically check QTc's.  Of note, she has documented reactions to Reglan and Compazine. -P.o. hydrocortisone 10 mg twice daily for adrenal insufficiency -Tylenol '1000mg'$  TID, p.o. Dilaudid 4 mg every 6 hours as needed.  Lidocaine patch, heat/ice.  Will try to wean off Dilaudid. -Deseeding oxybutynin and gabapentin, unclear clinical benefit and they both contain red dye.  Will consider Lyrica. - Follow-up 21-hydroxylase, renin/Aldo level -Miralax daily, dulcolax prn -dysphagia 3 diet, Ensure, gluten-free diet  -will need to establish with endocrinology as outpatient and also follow-up with GI and urology.  Sinus bradycardia Rates continue to improve, remains NSR this morning. More reactive with pain/discomfort compared to prior. -appreciate cardiology management -hold propranolol, clonidine -telemetry -if HR fails to improve, consider EP consult -intervention with pacing and possible atropine if symptomatic bradycardia with acute BP drop or change in mentation   Urinary retention Possible interstitial cystitis UTI She has undergone extensive workup with urology and has failed multiple  outpatient therapies, including intravesical local anesthetic. She was referred to Dr. Amalia Hailey at [facility] for further workup, but has not yet been seen by him. Unclear if there is a component of interstitial cystitis at play given experienced more discomfort when foley was removed. Discussed with urology, no acute hospital interventions required. Will need outpatient urology follow up with interstitial cystitis expert.  We are not treating for UTI.  It has been 24 hours since onset of antibiotics.  She is still retaining urine.  Will replace Foley at this point.. -Replace Foley -Ceftriaxone 2 mg daily for 5 days -Interstitial cystitis pain management as above.  She will need follow-up with urology outpatient.   Resistant Hypertension Now on amlodipine  10, irbesartan 300, chlorthalidone 25, still mildly hypertensive.  Will CTM.  Do not want to put her on spironolactone given there is still some concern for primary adrenal insufficiency.  See event note yesterday about severe symptomatic hypertensive episode.  I think a lot of this is due to vomiting up her blood pressure medicines and there is a significant component of anxiety involved as well.  Treating her anxiety will be key to maintaining blood pressure and preventing morbidity.  Do not think we need to change her daily blood pressure regimen.  Treat severe symptomatic hypotensive episodes with IV meds as needed. -Appreciate cardiology management - Continue amlodipine 10, irbesartan 300, chlorthalidone 25 daily -holding clonidine, propanolol  -IV hydralazine 10 mg as needed for SBP >160   Hyponatremia Resolved, will CTM.  Of note she still diuresed >3L today.  GERD Takes pepcid at home. Likely worsened here with initiation of steroid therapy. Starting PPI BID. Watch for worsening symptoms after starting NSAIDs -PO protonix '40mg'$  BID  Hashimoto thyroiditis Synthroid increased.  Adrenal insufficiency management as above. -Synthroid 150 mcg  daily -Repeat TSH mid April 2024   Anxiety/depression Borderline personality disorder Patient reports he is currently being tapered off venlafaxine with uptitrating of her Prozac. Has significant trauma from recent medical events. Expressed concerns about mood to consulting cardiologist, including feelings of worthlessness and wish to be changed to DNR/DNI. Discussed with psychiatry via phone call, patient is noted to have borderline personality and splitting. They stated that she does not have a history of suicide attempts and believe her risk to be low. Advised to continue current medications and follow up with outpatient psychiatry.  I think her anxiety, PTSD, and BPD are contributing significantly to her ongoing symptoms and prolonged hospital stay.  Will consider reaching back out to psychiatry for further recommendations, but unclear if there are any interventions that would help. -Venlafaxine 75 mg daily -Prozac 20 mg daily -Latuda 20 mg daily -Clonazepam 1 mg TID PRN for anxiety - f/u with outpatient psychiatry - chaplain services ordered   Asthma Respiratory status stable. No wheezing or rales on exam. Home regimen includes Symbicort and PRN Ventolin. -Resume as needed albuterol inhaler -Dulera 2 puffs twice daily  Diet:  dysphagia 3 IVF: None VTE: Enoxaparin Code: Full PT/OT recs: Pending  Dispo: Pending clinical improvement  Linus Galas, MD Internal medicine PGY 1 Pager: [phone]  Please contact the on call pager after 5 pm and on weekends at [phone].

## 2022-05-02 NOTE — Interval H&P Note (Signed)
History and Physical Interval Note: Patient is the same. Flex sig today to ensure no cause of lower abdominal pain given her workup has otherwise been unremarkable. We may take some biopsies while there given she has baseline loose stools. I have discussed risks / benefits she wishes to proceed.  05/02/2022 11:24 AM  Nala C Fogel  has presented today for surgery, with the diagnosis of lower abdominal pain.  The various methods of treatment have been discussed with the patient and family. After consideration of risks, benefits and other options for treatment, the patient has consented to  Procedure(s): FLEXIBLE SIGMOIDOSCOPY (N/A) as a surgical intervention.  The patient's history has been reviewed, patient examined, no change in status, stable for surgery.  I have reviewed the patient's chart and labs.  Questions were answered to the patient's satisfaction.     [place]

## 2022-05-02 NOTE — Progress Notes (Signed)
At bedside for PIV insertion. Attempted x 1 with no success. Very poor vasculature. Multiple sticks, reconstructive sx on R hand and posterior forearm. Amputation of left hand. Unable to identify any suitable vessel for PIV insertion. CL/PICC recommended. RN aware.

## 2022-05-03 LAB — URINE CULTURE: Culture: >=100000 — AB

## 2022-05-03 LAB — GLUCOSE, CAPILLARY
Glucose-Capillary: 108 mg/dL — ABNORMAL HIGH (ref 70–99)
Glucose-Capillary: 108 mg/dL — ABNORMAL HIGH (ref 70–99)
Glucose-Capillary: 105 mg/dL — ABNORMAL HIGH (ref 70–99)
Glucose-Capillary: 139 mg/dL — ABNORMAL HIGH (ref 70–99)

## 2022-05-03 LAB — RENAL FUNCTION PANEL
Albumin: 3.7 g/dL (ref 3.5–5.0)
Anion gap: 14 (ref 5–15)
BUN: 13 mg/dL (ref 6–20)
CO2: 23 mmol/L (ref 22–32)
Calcium: 9.1 mg/dL (ref 8.9–10.3)
Chloride: 100 mmol/L (ref 98–111)
Creatinine, Ser: 0.92 mg/dL (ref 0.44–1.00)
GFR, Estimated: >60 mL/min (ref >60)
Glucose, Bld: 98 mg/dL (ref 70–99)
Phosphorus: 3.9 mg/dL (ref 2.5–4.6)
Potassium: 3.7 mmol/L (ref 3.5–5.1)
Sodium: 137 mmol/L (ref 135–145)

## 2022-05-03 MED ORDER — LORAZEPAM 2 MG/ML IJ SOLN
1.0000 mg | Freq: Once | INTRAMUSCULAR | Status: AC
  Administered 2022-05-03: 1 mg via INTRAVENOUS
  Filled 2022-05-03: qty 1

## 2022-05-03 MED ORDER — HYDROMORPHONE HCL 1 MG/ML IJ SOLN
1.0000 mg | INTRAMUSCULAR | Status: DC | PRN
  Administered 2022-05-03 – 2022-05-04 (×3): 1 mg via INTRAVENOUS
  Filled 2022-05-03 (×3): qty 1

## 2022-05-03 MED ORDER — HYDROMORPHONE HCL 1 MG/ML IJ SOLN
1.0000 mg | Freq: Once | INTRAMUSCULAR | Status: AC
  Administered 2022-05-03: 1 mg via INTRAVENOUS
  Filled 2022-05-03: qty 1

## 2022-05-03 MED ORDER — LORAZEPAM 1 MG PO TABS: 1.0000 mg | ORAL_TABLET | Freq: Four times a day (QID) | ORAL | Status: DC | PRN

## 2022-05-03 MED ORDER — TRIAMCINOLONE ACETONIDE 40 MG/ML IJ SUSP
10.0000 mg | Freq: Once | INTRAMUSCULAR | Status: AC
  Administered 2022-05-03: 10 mg via INTRADERMAL
  Filled 2022-05-03: qty 0.25

## 2022-05-03 MED ORDER — SODIUM CHLORIDE 0.9 % IV SOLN
250.0000 mg | Freq: Three times a day (TID) | INTRAVENOUS | Status: DC
  Administered 2022-05-03 – 2022-05-04 (×4): 250 mg via INTRAVENOUS
  Filled 2022-05-03 (×6): qty 5

## 2022-05-03 MED ORDER — LIDOCAINE HCL (PF) 1 % IJ SOLN
5.0000 mL | Freq: Once | INTRAMUSCULAR | Status: AC
  Administered 2022-05-03: 5 mL via INTRADERMAL
  Filled 2022-05-03: qty 5

## 2022-05-03 MED ORDER — HYDROCORTISONE SOD SUC (PF) 100 MG IJ SOLR
100.0000 mg | Freq: Two times a day (BID) | INTRAMUSCULAR | Status: DC
  Administered 2022-05-03 – 2022-05-04 (×2): 100 mg via INTRAVENOUS
  Filled 2022-05-03 (×2): qty 2

## 2022-05-03 MED ORDER — PREGABALIN 100 MG PO CAPS
100.0000 mg | ORAL_CAPSULE | Freq: Two times a day (BID) | ORAL | Status: DC
  Administered 2022-05-03: 100 mg via ORAL
  Filled 2022-05-03: qty 1

## 2022-05-03 MED ORDER — LORAZEPAM 2 MG/ML IJ SOLN: 1.0000 mg | Freq: Four times a day (QID) | INTRAMUSCULAR | Status: DC | PRN

## 2022-05-03 MED ORDER — DIPHENHYDRAMINE HCL 50 MG/ML IJ SOLN
25.0000 mg | Freq: Once | INTRAMUSCULAR | Status: AC | PRN
  Administered 2022-05-03: 25 mg via INTRAVENOUS
  Filled 2022-05-03: qty 1

## 2022-05-03 MED ORDER — LORAZEPAM 1 MG PO TABS: 2.0000 mg | ORAL_TABLET | Freq: Four times a day (QID) | ORAL | Status: DC | PRN

## 2022-05-03 NOTE — Progress Notes (Signed)
Subjective:   Summary: Krystal [name] is a 32 y.o. year old female currently admitted on the IMTS HD#8 for bradycardia.  Overnight Events: PICC line placed yesterday for access.  HD stable overnight.  Remains on room air.  Anxious again and vomiting overnight, given Ativan x 1.  Message this morning from RN about patient sobbing continuously, resting comfortably at times and then reporting severe uncontrollable pain and other times.  Reports nausea that is unresolved with Zofran.  Patient denies any other non-Rx/non-IV modalities to assist with pain control.  2 episodes of small-volume retching.  Family arrived at bedside.  Husband is on the phone throughout much of this morning.  Repeated EKG this morning, QTc 470s.  Okay to start erythromycin empirically.  Mother and sister in the room at time of my assessment.  Husband is on the phone.  On my assessment, she is still having excruciating lower abdominal pain.  Still having some nausea and vomiting, mostly retching and she has not eaten or drank much in the past couple days.  No chest pain or trouble breathing.  No other new symptoms.  Foley is in.  She had no relief after Foley placement yesterday, maybe a slight decrease in pressure.  After assessing the patient, I spent about 30 minutes with the family going over workup so far this admission, multiple attempts at treating her abdominal pain and nausea involving multiple consulting teams.  Mother and sister confirm that this has been an ongoing issue for a long time, it worsened after her last C-section and has been progressively worsening since then.  Answered all questions.  They are okay with [place] trying anything within reason to try to control her abdominal pain.  They are okay with medications that have mild side effects if it has some relief on the pain.  I expressed my concerns about developing tolerance to opioids, especially IV Dilaudid and IV morphine.  They agree that  continuing treatment with narcotics, especially IV narcotic medications is counterproductive.  They agree with the stated goal of weaning off of IV medicines and discharging home with p.o. medicines when pain is more controlled.  Addendum: I also came back later and performed an abdominal wall steroid injection with Dr. Heber Irvington supervising.  Please see separate procedural note.  Objective:  Vital signs in last 24 hours: Vitals:   05/02/22 1220 05/02/22 1400 05/02/22 2038 05/03/22 0401  BP: 134/87 109/62  121/84  Pulse: (!) 101 (!) 57  71  Resp: '13 15  16  '$ Temp:    98.1 F (36.7 C)  TempSrc:    Oral  SpO2: 95%  100% 96%  Weight:    104.8 kg  Height:       Supplemental O2: Room Air SpO2: 96 %   Physical Exam:  General: uncomfortable appearing young female, laying in bed CV: RRR, no m/r/g. Pulm: normal WOB on RA. Abdomen: soft, nondistended. Tenderness to palpation of suprapubic region and LLQ.  hypoactive bowel sounds. MSK: s/p amputation of L hand, R wrist reconstructed with healed surgical scar. Skin: warm and dry. Neuro: AAOx3, no focal deficits, moving all extremities spontaneously.   Filed Weights   04/23/22 1750 05/02/22 0612 05/03/22 0401  Weight: 108 kg 105.1 kg 104.8 kg     Intake/Output Summary (Last 24 hours) at 05/03/2022 0714 Last data filed at 05/02/2022 2109 Gross per 24 hour  Intake 49 ml  Output 1650 ml  Net -1601 ml    Net IO Since Admission: -21,281.65 mL [05/03/22 0714]  Pertinent Labs:    Latest Ref Rng & Units 05/02/2022    7:06 AM 05/01/2022    2:47 PM 04/27/2022    3:52 AM  CBC  WBC 4.0 - 10.5 K/uL 10.5  15.8  9.6   Hemoglobin 12.0 - 15.0 g/dL 15.5  15.4  14.4   Hematocrit 36.0 - 46.0 % 44.6  43.6  41.6   Platelets 150 - 400 K/uL 330  353  337        Latest Ref Rng & Units 05/03/2022    6:37 AM 05/02/2022    7:06 AM 05/01/2022    2:47 PM  CMP  Glucose 70 - 99 mg/dL 98  89  85   BUN 6 - 20 mg/dL '13  13  16   '$ Creatinine 0.44 - 1.00 mg/dL 0.92   0.96  0.99   Sodium 135 - 145 mmol/L 137  137  135   Potassium 3.5 - 5.1 mmol/L 3.7  3.9  4.2   Chloride 98 - 111 mmol/L 100  97  96   CO2 22 - 32 mmol/L '23  29  25   '$ Calcium 8.9 - 10.3 mg/dL 9.1  9.5  9.4     Imaging: CT abdomen IMPRESSION: 1. Foci of free air in the upper abdomen, umbilicus, and low anterior abdominal wall, likely related to history of recent surgery. No hematoma or abscess is seen. 2. Trace amount of free fluid in the cul-de-sac. 3. Hepatic steatosis. 4. Foley catheter in the urinary bladder.     EKG: NSR rates 60-80 this morning  Assessment/Plan:   Principal Problem:   Adrenal insufficiency (HCC) Active Problems:   Hypothyroidism due to Hashimoto's thyroiditis   Bradycardia   Abdominal pain, chronic, right lower quadrant   Post-operative pain   Primary hypertension   Nausea and vomiting   Lower abdominal pain   Chronic diarrhea   Patient Summary: Krystal [name] is a 32 y.o. 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   Adrenal Insufficiency Chronic abdominal pain Nausea/vomiting After involving more firmly this morning, goal remains to control pain and nausea without narcotics if possible and discharge for outpatient specialist follow-up. Have exhausted most high yield diagnostic possibilities for ongoing abdominal pain.  We are currently continuing to treat her adrenal insufficiency.  Unlikely that there is a GI etiology of her pain, porphyria labs pending.  We DC'd Phenergan and started empiric erythromycin for likely gastroparesis this morning after checking QTc and she has not had any vomiting since.  OB/GYN and urology signed off and she will follow-up for possible interstitial cystitis management in the outpatient setting.  We did an abdominal  wall steroid injection this afternoon.  Family is okay with medicines that have milder side effects if they provide any relief for the pain. -Appreciate GI recommendations.  Follow-up porphyria labs - Zofran 4 mg every 6 hours as needed, DC'd Phenergan, added erythromycin empirically for gastroparesis treatment.  Will periodically check QTc's.  Of note, she has documented reactions to Reglan and Compazine.  Can use Ativan for refractory nausea and vomiting. -P.o. hydrocortisone 10 mg twice daily for adrenal insufficiency -Tylenol '1000mg'$  TID, p.o. Dilaudid 4 mg every 6 hours as needed.  Lidocaine patch, heat/ice.  Will  try to wean off Dilaudid.  IV Dilaudid 1 mg if absolutely needed. -Started Lyrica 100 mg twice daily - Follow-up 21-hydroxylase, renin/Aldo level -Miralax daily, dulcolax prn -dysphagia 3 diet, Ensure, gluten-free diet  -will need to establish with endocrinology as outpatient and also follow-up with GI and urology.  Sinus bradycardia Resolved, will continue to hold meds below -appreciate cardiology management -hold propranolol, clonidine -telemetry   Urinary retention Possible interstitial cystitis UTI Foley is back in and we are still treating for UTI.  Will continue with IV management for UTI given her inability to tolerate p.o. -Replace Foley -Ceftriaxone 2 mg daily for 5 days -Interstitial cystitis pain management as above.  She will need follow-up with urology outpatient.   Resistant Hypertension Now on amlodipine 10, irbesartan 300, chlorthalidone 25, still mildly hypertensive.  Will CTM.  Do not want to put her on spironolactone given there is still some concern for primary adrenal insufficiency.  See event note yesterday about severe symptomatic hypertensive episode.  I think a lot of this is due to vomiting up her blood pressure medicines and there is a significant component of anxiety involved as well.  Treating her anxiety will be key to maintaining blood pressure  and preventing morbidity.  Do not think we need to change her daily blood pressure regimen.  Treat severe symptomatic hypotensive episodes with IV meds as needed. -Appreciate cardiology management - Continue amlodipine 10, irbesartan 300, chlorthalidone 25 daily -holding clonidine, propanolol  -IV hydralazine 10 mg as needed for SBP >160   Hyponatremia Resolved, will CTM.    GERD Takes pepcid at home. Likely worsened here with initiation of steroid therapy. Starting PPI BID. Watch for worsening symptoms after starting NSAIDs -PO protonix '40mg'$  BID  Hashimoto thyroiditis Synthroid increased.  Adrenal insufficiency management as above. -Synthroid 150 mcg daily -Repeat TSH mid April 2024   Anxiety/depression Borderline personality disorder Patient reports he is currently being tapered off venlafaxine with uptitrating of her Prozac. Has significant trauma from recent medical events. Expressed concerns about mood to consulting cardiologist, including feelings of worthlessness and wish to be changed to DNR/DNI. Discussed with psychiatry via phone call, patient is noted to have borderline personality and splitting. They stated that she does not have a history of suicide attempts and believe her risk to be low. Advised to continue current medications and follow up with outpatient psychiatry.  I think her anxiety, PTSD, and BPD are contributing significantly to her ongoing symptoms and prolonged hospital stay.  Will consider reaching back out to psychiatry for further recommendations, but unclear if there are any interventions that would help. -Venlafaxine 75 mg daily -Prozac 20 mg daily -Latuda 20 mg daily -Clonazepam 1 mg TID PRN for anxiety - f/u with outpatient psychiatry - chaplain services ordered   Asthma Respiratory status stable. No wheezing or rales on exam. Home regimen includes Symbicort and PRN Ventolin. -Resume as needed albuterol inhaler -Dulera 2 puffs twice daily  Diet:   dysphagia 3 IVF: None VTE: Enoxaparin Code: Full PT/OT recs: Pending  Dispo: Pending clinical improvement  Linus Galas, MD Internal medicine PGY 1 Pager: [phone]  Please contact the on call pager after 5 pm and on weekends at [phone].

## 2022-05-03 NOTE — Progress Notes (Signed)
After receiving all medications in the order of antinausea, anxiety and sleep aid and then pain medication, patient tolerated intake well. Patient intake broth after endorsing nausea. Patient vomits 278m of brown liquid. Patient requested to receive all medications again and or iv pain medication.   Husband made aware of plan and goals for her medication. Husband is agreeable and understanding.   Zheng MD paged for one time dose if ativan for anxiety to help patient sleep.   Shorty after receiving dose of ativan, patient was awaken from sleep. Patient is crying stating she is extreme pain. 15 mins later upon entering room, patient is sleep. Patient slept throughout night.

## 2022-05-03 NOTE — Procedures (Addendum)
PROCEDURE NOTE  PROCEDURE: lower abdominal wall steroid injection/ trigger point injection  PREOPERATIVE DIAGNOSIS: abdominal wall strain  POSTOPERATIVE DIAGNOSIS: abdominal wall strain  PROCEDURE: The patient was apprised of the risks and the benefits of the procedure and informed consent was obtained, as witnessed by Dr. Heber Osmond and Dr. Allyson Sabal. Time-out procedure was performed, with confirmation of the patient's name, date of birth, and correct identification of the lower abdominal wall location to be injected. The patient's abdominal wall was then marked at the appropriate site for injection placement. The abdomen was sterilely prepped with Betadine. A 10 mg  solution of Kenalog was drawn up into a 10 mL syringe with a 5 mL of 1% lidocaine. The patient was injected with a 25 gauge needle at the lower abdomen. There were no complications. The patient tolerated the procedure well and had some immediate relief in her lower abdominal pain. There was minimal bleeding. The patient is admitted in the IMTS inpatient service and will be monitored for relief..  The procedure was supervised by attending physician, Dr. Angelia Mould.

## 2022-05-03 NOTE — Progress Notes (Signed)
0723- pt resting on right side during handoff; acknowledges new nurse verbally; denies any needs at this time.  0854 - pt sobbing/huffing continuously throughout assessment; reports 9/10 pain unchanged from chief complaint, unresolved with PRN dilaudid. Also reports nausea unresolved with PRN zofran. Medical team alerted. Room temperature lowered at the request of pt; states hot/cold therapy ineffective. Pt denies any other nonRx modalities to assist with pain control.   0943 - pt requesting klonopin for anxiety. Husband on speakerphone when I entered; I asked him how I could best help pt. He suggests spacing out meds to prevent loss through emesis episodes. States mom and sister will be coming to visit today. Pt remains inconsolable.  1000 - 2 episodes NBNB emesis immediately and again shortly after po klonopin admin, approx 20cc clear with some phlegm. No tablets seen in output.  83 - mom and sister at bedside  Thorough round with Dr. Jodell Cipro (see his note). Agreed they could assist pt with spacing out non-controlled meds to alleviate nursing demand.  1135 - pt resting calmly in bed. Daily meds given to pt who will work through them as nausea permits. Reports some relief from IV dilaudid/ativan combo. Pain 8/10  1230 - trigger point injection performed bedside  1445 - pt called out for pain meds. She has finished taking all po meds, witnessed by mom and sister at bedside, without any emesis. Pt calm but does still appear uncomfortable; aquathermia initiated. Po dilaudid and IV zofran given per pt request. Mom and sister stepping away for lunch.  1525 - Pt called out crying for reports of unwitnessed emesis. 250cc consistent with iced coffee at bedside. No pills or solids appreciated.  2+ more episodes of emesis unwitnessed. 100cc in emesis bag; no solids appreciated.  61 - Mom and sister back at bedside. Pt refusing scheduled pills 2/2 n/v. Remains inconsolable. I advised against iced  coffee r/t acidity and lactose; family endorses understanding.  55 - family requesting provider return to bedside 2/2 poor pain ctrl. Night team will round. Attempted to coach pt through breathing techniques to combat sympathetic response; pt states this is a trigger for her and asked me to stop before she goes into a rage. Pt left in room with mom and sister at bedside, lights off, doors closed.  60 - Sister states pt's vision is declining. Hypertensive; one-time IV dilaudid and PRN hydralazine given. MD on the way to bedside. Husband on speakerphone telling me I need to "do better" citing delays in pain med administration. I declined to have a conversation with him in front of the pt as I did not feel it was conducive to a calm or therapeutic environment, so I provided my shift number and asked him to call me directly to discuss concerns.   Don.Hylan - MD at bedside. See order history.

## 2022-05-04 ENCOUNTER — Inpatient Hospital Stay (HOSPITAL_COMMUNITY): Payer: Self-pay

## 2022-05-04 LAB — BASIC METABOLIC PANEL
Anion gap: 10 (ref 5–15)
BUN: 14 mg/dL (ref 6–20)
CO2: 25 mmol/L (ref 22–32)
Calcium: 9 mg/dL (ref 8.9–10.3)
Chloride: 102 mmol/L (ref 98–111)
Creatinine, Ser: 0.93 mg/dL (ref 0.44–1.00)
GFR, Estimated: >60 mL/min (ref >60)
Glucose, Bld: 137 mg/dL — ABNORMAL HIGH (ref 70–99)
Potassium: 3.5 mmol/L (ref 3.5–5.1)
Sodium: 137 mmol/L (ref 135–145)

## 2022-05-04 LAB — GLUCOSE, CAPILLARY
Glucose-Capillary: 134 mg/dL — ABNORMAL HIGH (ref 70–99)
Glucose-Capillary: 134 mg/dL — ABNORMAL HIGH (ref 70–99)
Glucose-Capillary: 104 mg/dL — ABNORMAL HIGH (ref 70–99)
Glucose-Capillary: 109 mg/dL — ABNORMAL HIGH (ref 70–99)

## 2022-05-04 MED ORDER — SCOPOLAMINE 1 MG/3DAYS TD PT72
1.0000 | MEDICATED_PATCH | TRANSDERMAL | Status: DC
  Administered 2022-05-04: 1.5 mg via TRANSDERMAL
  Filled 2022-05-04: qty 1

## 2022-05-04 MED ORDER — SODIUM CHLORIDE 0.9 % IV SOLN
250.0000 mg | Freq: Three times a day (TID) | INTRAVENOUS | Status: DC
  Filled 2022-05-04 (×4): qty 5

## 2022-05-04 MED ORDER — HYDROCORTISONE 10 MG PO TABS
10.0000 mg | ORAL_TABLET | Freq: Two times a day (BID) | ORAL | Status: DC
  Administered 2022-05-04 – 2022-05-05 (×2): 10 mg via ORAL
  Filled 2022-05-04 (×4): qty 1

## 2022-05-04 MED ORDER — LIDOCAINE 5 % EX PTCH
1.0000 | MEDICATED_PATCH | CUTANEOUS | Status: DC
  Administered 2022-05-04: 1 via TRANSDERMAL
  Filled 2022-05-04: qty 1

## 2022-05-04 MED ORDER — CLONAZEPAM 0.5 MG PO TABS
1.0000 mg | ORAL_TABLET | Freq: Three times a day (TID) | ORAL | Status: DC | PRN
  Administered 2022-05-04 – 2022-05-05 (×3): 1 mg via ORAL
  Filled 2022-05-04 (×3): qty 2

## 2022-05-04 MED ORDER — KETOROLAC TROMETHAMINE 30 MG/ML IJ SOLN
30.0000 mg | Freq: Once | INTRAMUSCULAR | Status: AC
  Administered 2022-05-04: 30 mg via INTRAVENOUS
  Filled 2022-05-04: qty 1

## 2022-05-04 MED ORDER — OXYCODONE HCL 5 MG PO TABS
5.0000 mg | ORAL_TABLET | ORAL | Status: DC | PRN
  Administered 2022-05-04 – 2022-05-05 (×3): 5 mg via ORAL
  Filled 2022-05-04 (×4): qty 1

## 2022-05-04 MED ORDER — LORAZEPAM 1 MG PO TABS
2.0000 mg | ORAL_TABLET | Freq: Once | ORAL | Status: AC | PRN
  Administered 2022-05-04: 2 mg via SUBLINGUAL
  Filled 2022-05-04: qty 2

## 2022-05-04 MED ORDER — OXYCODONE HCL 5 MG PO TABS
10.0000 mg | ORAL_TABLET | Freq: Once | ORAL | Status: AC
  Administered 2022-05-04: 10 mg via ORAL
  Filled 2022-05-04: qty 2

## 2022-05-04 MED ORDER — SODIUM CHLORIDE 0.9 % IV SOLN
2.0000 g | INTRAVENOUS | Status: DC
  Administered 2022-05-04: 2 g via INTRAVENOUS
  Filled 2022-05-04 (×2): qty 20

## 2022-05-04 MED ORDER — SODIUM CHLORIDE 0.9 % IV SOLN
12.5000 mg | Freq: Four times a day (QID) | INTRAVENOUS | Status: DC | PRN
  Administered 2022-05-04 – 2022-05-05 (×3): 12.5 mg via INTRAVENOUS
  Filled 2022-05-04 (×3): qty 0.5

## 2022-05-04 MED ORDER — DIPHENHYDRAMINE HCL 50 MG/ML IJ SOLN
25.0000 mg | Freq: Once | INTRAMUSCULAR | Status: AC | PRN
  Administered 2022-05-04: 25 mg via INTRAVENOUS
  Filled 2022-05-04: qty 1

## 2022-05-04 MED ORDER — HYDROXYZINE HCL 25 MG PO TABS
25.0000 mg | ORAL_TABLET | Freq: Once | ORAL | Status: AC
  Administered 2022-05-04: 25 mg via ORAL
  Filled 2022-05-04: qty 1

## 2022-05-04 MED ORDER — HYDROMORPHONE HCL 2 MG PO TABS
2.0000 mg | ORAL_TABLET | ORAL | Status: DC | PRN
  Administered 2022-05-04: 2 mg via ORAL
  Filled 2022-05-04: qty 1

## 2022-05-04 NOTE — Progress Notes (Signed)
Mobility Specialist - Progress Note   05/04/22 1600  Mobility  Activity Refused mobility    Pt in bed appearing uncomfortable. Politely declined and stated she was in too much pain to get OOB. Will follow up as able.   [place] Specialist Please contact via SecureChat or Rehab office at [phone]

## 2022-05-04 NOTE — Progress Notes (Signed)
Pt reports nausea and vomiting this pm - IV phenergan given per orders.  Family requesting to speak with MD - notified MD and at the bedside.

## 2022-05-04 NOTE — Progress Notes (Signed)
Subjective:   Summary: Krystal [name] is a 32 y.o. year old female currently admitted on the IMTS HD#9 for bradycardia.  Overnight Events: She had another episode of possible symptomatic hypertension yesterday which was appropriately treated with hydralazine.  She was also tearful and screaming out for pain and anxiety medicine so she received Ativan and Dilaudid as we were concerned about her description of blurry vision and wanted to avoid morbidity from symptomatic hypertension which her husband has stated has caused chronic right-sided blurry vision and has previously only been treated with anxiety and pain medicine, as described in my previous event note.  NAEON.  HD stable. Remains on room air.  2x Dilaudid overnight, Zofran as often as she can get it.  Nursing staff report that she did have good p.o. intake overnight with multiple broths, ginger ale's, [place] ice.  History: Patient is walking around her room with her IV at time of our assessment this morning.  Seems to be in minimal discomfort.  Reported that she did not have any emesis overnight but did have a little bit of retching occasionally.  Says she had some superficial relief from the steroid injection yesterday but that her bladder feels similarly and her pain is still occasionally very severe.  She is not currently in severe pain.  She still endorsing some blurry vision in her left eye.  Of note, nursing staff has reported concerning behaviors over the past couple of days including: "Requesting benadryl for HA immediately after IV narc admin, She reportedly asked night shift to power flush after IV narc admin, going so far as to say the pump was running too slow to be a proper flush".  She also frequently calls her husband who criticizes nursing staff for not providing pain medicines "fast enough".  Outpatient pharmacist has also been very concerned due to patient being prescribed narcotics and requesting  refills had multiple occasions over the past month, but important to note that this was prior to her ovarian cystectomy and narcotics were prescribed due to what was thought to be acute pain, however it is more clear at this point that her abdominal pain is chronic and she had no relief from the cystectomy.  The patient also asked [place] if she could be discharged with a PICC line and stated that she will probably go to a different hospital after discharge for further treatment of her pain.  Whenever we have discussed de-escalating pain treatment from IV therapy to p.o. therapy so we can move towards discharge, the patient has become tearful and has continued to request scheduled IV pushes of Dilaudid despite her and her family stated goal of weaning to p.o. to discharge and then see the appropriate specialists in the outpatient setting.  We once again reiterated to her and her husband that we have exhausted diagnostic possibilities during this hospital stay and that there is no clear indication to continue using IV Dilaudid.  We have explained that she is showing physiologic signs of tolerance and that we want to switch to p.o. medicines which will have more of a steady state function.  Objective:  Vital signs in last 24 hours: Vitals:   05/03/22 1100 05/03/22 1212 05/03/22 2003 05/04/22 0512  BP: (!) 158/105 (!) 128/95 133/79 107/62  Pulse:  85 99 75  Resp:  '17 16 17  '$ Temp:  97.6 F (36.4 C) 98.1 F (36.7 C)  97.6 F (36.4 C)  TempSrc:  Oral Oral Oral  SpO2:      Weight:    101.5 kg  Height:       Supplemental O2: Room Air SpO2: 96 %   Physical Exam:  General: uncomfortable appearing young female, laying in bed CV: RRR, no m/r/g. Pulm: normal WOB on RA. Abdomen: soft, nondistended. Tenderness to palpation of suprapubic region and LLQ.  hypoactive bowel sounds. MSK: s/p amputation of L hand, R wrist reconstructed with healed surgical scar. Skin: warm and dry. Neuro: AAOx3, no focal  deficits, moving all extremities spontaneously.   Filed Weights   05/02/22 0612 05/03/22 0401 05/04/22 0512  Weight: 105.1 kg 104.8 kg 101.5 kg     Intake/Output Summary (Last 24 hours) at 05/04/2022 [id number] Last data filed at 05/03/2022 2202 Gross per 24 hour  Intake 100 ml  Output 2700 ml  Net -2600 ml    Net IO Since Admission: -23,881.65 mL [05/04/22 0621]  Pertinent Labs:    Latest Ref Rng & Units 05/02/2022    7:06 AM 05/01/2022    2:47 PM 04/27/2022    3:52 AM  CBC  WBC 4.0 - 10.5 K/uL 10.5  15.8  9.6   Hemoglobin 12.0 - 15.0 g/dL 15.5  15.4  14.4   Hematocrit 36.0 - 46.0 % 44.6  43.6  41.6   Platelets 150 - 400 K/uL 330  353  337        Latest Ref Rng & Units 05/03/2022    6:37 AM 05/02/2022    7:06 AM 05/01/2022    2:47 PM  CMP  Glucose 70 - 99 mg/dL 98  89  85   BUN 6 - 20 mg/dL '13  13  16   '$ Creatinine 0.44 - 1.00 mg/dL 0.92  0.96  0.99   Sodium 135 - 145 mmol/L 137  137  135   Potassium 3.5 - 5.1 mmol/L 3.7  3.9  4.2   Chloride 98 - 111 mmol/L 100  97  96   CO2 22 - 32 mmol/L '23  29  25   '$ Calcium 8.9 - 10.3 mg/dL 9.1  9.5  9.4    Imaging: CT abdomen IMPRESSION: 1. Foci of free air in the upper abdomen, umbilicus, and low anterior abdominal wall, likely related to history of recent surgery. No hematoma or abscess is seen. 2. Trace amount of free fluid in the cul-de-sac. 3. Hepatic steatosis. 4. Foley catheter in the urinary bladder.   EKG: NSR rates 60-80 this morning  Assessment/Plan:   Principal Problem:   Adrenal insufficiency (HCC) Active Problems:   Hypothyroidism due to Hashimoto's thyroiditis   Bradycardia   Abdominal pain, chronic, right lower quadrant   Post-operative pain   Primary hypertension   Nausea and vomiting   Lower abdominal pain   Chronic diarrhea   Patient Summary: Krystal [name] is a 32 y.o. with history of postpartum autoimmune thyroiditis and pregnancy complicated by HELLP syndrome admitted for known chronic abdominal  pain with vomiting and diarrhea after laparoscopic ovarian cystectomy, also found to be profoundly bradycardic.   Adrenal Insufficiency Chronic abdominal pain The etiology of her chronic suprapubic abdominal pain is still unclear.  OB/GYN was consulted initially on admission and CT abdomen did not reveal any complications of recent ovarian cystectomy or anything gynecologic that they would intervene on, and they have signed off.  The OB provider was also very clear with communication with her that they were unsure if the cyst  was actually the source of her chronic abdominal pain and the patient and her family have endorsed understanding of this to me and stated that they were just trying to find something that could explain her pain.  Urology was consulted earlier in the admission for what is presumed to be interstitial cystitis, but she has previously had extensive treatment for this including trials of medications including amitriptyline, oxybutynin, duloxetine, neuropathic treatment with gabapentin, and even intravesicular anesthetics and none of these attempts were fruitful in relieving her abdominal pain.  Urology signed off and did not have anything to offer during this admission and suggested that she follow-up with interstitial cystitis specialist Dr. Amalia Hailey in the outpatient setting.  She has already had cholecystectomy and appendectomy.  She has had multiple lipases that were negative.  She did not initially have a UTI, but repeat urine studies did reveal urinary tract infection which is currently being treated with IV ceftriaxone.  She has no signs of kidney stones on imaging.  She was diagnosed with adrenal insufficiency which based on ACTH is more likely to be secondary in nature, but MRI pituitary was negative and her other pituitary hormone levels were within normal limits.  We have conferred with endocrinologist Dr. Buddy Duty who supported our management with hydrocortisone and suggested that he did  not see utility in also adding fludrocortisone at this time.  He will follow-up with her in the outpatient setting.  There was some concern for celiac disease as she has elements of gluten and lactose intolerance, but celiac serologies were negative. Her diet was changed to exclude gluten and lactose regardless. Gastroenterology was consulted for further help with management and they did a flex sig which was unrevealing, pathology is still pending.  They send porphyria labs which the random porphobilinogen is negative and fractionated porphyrins is still in process.  Vasculitis including microscopic polyangiitis is a possibility, but she is being treated on steroids currently anyway and unclear if she has other symptoms or lab findings of vasculitides.  Overall, this patient has had multiple comprehensive workups for her chronic abdominal pain and there is still an unclear etiology.  There is not much else we can do in terms of diagnostics during this admission.  Her chronic pain is incredibly complicated to treat as she has several documented adverse reactions to medicines that would appropriately manage interstitial cystitis or chronic pain. The stated goal has always been to wean her to p.o. pain and nausea treatment and then discharge to the outpatient setting so she can follow-up with specialists as needed.  We have not been making progress towards this goal over the past few days, and she is actually displaying concerning behaviors for increased tolerance of opioids and physiologic craving as outlined in the subjective.  I do not believe that it will be appropriate to continue to treat her chronic abdominal pain with IV opioids and we have made this clear to the patient today.  We will try to wean her pain regimen and discharge in the coming days with appropriate follow-up. -Appreciate GI recommendations.  Follow-up porphyria labs -P.o. hydrocortisone 10 mg twice daily for adrenal insufficiency -Tylenol  '1000mg'$  TID, p.o. Dilaudid 2 mg every 4 hours as needed.  Lidocaine patch, heat/ice.  Will try to wean off Dilaudid. - DC'd Lyrica as it was causing her migraines from red dye - Follow-up 21-hydroxylase, renin/Aldo level -Miralax daily, dulcolax prn -dysphagia 3 diet, Ensure, gluten-free diet  -will need to establish with endocrinology as outpatient and  also follow-up with GI and urology.  Nausea/vomiting Previously Started erythromycin empirically for treatment of possible gastroparesis either at baseline or induced/exacerbated with narcotics.  Continued Zofran as well.  QTc was not concerning last check.  She is saying that her nausea is almost entirely secondary to pain and that her main relief is being pushed IV Dilaudid.  Please see above for pain management.  She is requesting that we discontinue the erythromycin and Zofran and switch back to Phenergan and a migraine cocktail.  Will do this today. -DC Zofran, erythromycin. - Check EKG for QTc - Phenergan 12.5 every 6 as needed.  First dose can be as part of migraine cocktail  Migraine Patient endorsing continued migraine which possibly started after attempting Lyrica yesterday. - See nausea/vomiting above for changes to antiemetics. 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 Do not want to put her on spironolactone given there is still some concern for primary adrenal insufficiency.  She has had multiple episodes of what seemed to be severe symptomatic hypertension over the past few days where her main symptom is endorsing blurry vision in her left eye.  This seems to be her husband's main concern as well.  These episodes are predominantly triggered by severe anxiety (both the patient and her husband agree with this) and possibly not keeping down her morning blood pressure  medicines, though she had one yesterday and is documented as receiving her p.o. blood pressure meds.  The patient and her husband are requesting that we push IV pain medicines for these episodes which I believe is counterproductive and due to our pain management discussion above, we will no longer do.  Overall, her baseline blood pressure is fairly well-controlled baseline and warrants no changes and we will treat severe symptomatic episodes with IV antihypertensives as appropriate. Propanolol might be appropriate to restart in the future as her HR has normalized. Given her persistent blurry vision, will rule out infarct with MRI -f\u brain MRI -Appreciate prior cardiology management - Continue amlodipine 10, irbesartan 300, chlorthalidone 25 daily -holding clonidine, propanolol  -IV hydralazine 10 mg as needed for SBP >160  Sinus bradycardia Resolved, will continue to hold meds below -appreciate cardiology management -hold propranolol, clonidine -telemetry   Urinary retention Possible interstitial cystitis UTI Foley is back in and we are still treating for UTI.  Will continue with IV management for UTI given her inability to tolerate p.o. -Replace Foley -Ceftriaxone 2 mg daily for 5 days -Interstitial cystitis pain management as above.  She will need follow-up with urology outpatient.   Hyponatremia Resolved, will CTM.    GERD Takes pepcid at home. Likely worsened here with initiation of steroid therapy. Starting PPI BID. Watch for worsening symptoms after starting NSAIDs -PO protonix '40mg'$  BID  Hashimoto thyroiditis Synthroid increased.  Adrenal insufficiency management as above. -Synthroid 150 mcg daily -Repeat TSH mid April 2024   Anxiety/depression Borderline personality disorder Patient reports he is currently being tapered off venlafaxine with uptitrating of her Prozac. Has significant trauma from recent medical events. Expressed concerns about mood to consulting  cardiologist, including feelings of worthlessness and wish to be changed to DNR/DNI. Discussed with psychiatry via phone call, patient is noted to have borderline personality and splitting. They stated that she does not have a history of suicide attempts and believe her risk to be low. Advised to  continue current medications and follow up with outpatient psychiatry.  I think her anxiety, PTSD, and BPD are contributing significantly to her ongoing symptoms and prolonged hospital stay.  Will consider reaching back out to psychiatry for further recommendations, but unclear if there are any interventions that would help. -Venlafaxine 75 mg daily -Prozac 20 mg daily -Latuda 20 mg daily -Clonazepam 1 mg TID PRN for anxiety - f/u with outpatient psychiatry - chaplain services ordered   Asthma Respiratory status stable. No wheezing or rales on exam. Home regimen includes Symbicort and PRN Ventolin. -Resume as needed albuterol inhaler -Dulera 2 puffs twice daily  Diet:  dysphagia 3 IVF: None VTE: Enoxaparin Code: Full PT/OT recs: Pending  Dispo: Pending clinical improvement  Linus Galas, MD Internal medicine PGY 1 Pager: [phone]  Please contact the on call pager after 5 pm and on weekends at [phone].

## 2022-05-04 NOTE — Progress Notes (Addendum)
0701- pt resting calmly in bed. Reports 8/10 pain and asking when next dose of dilaudid is due. Whiteboard updated to reflect due time. Pt denies any further needs at this time.  0855 - pt ambulating about room, requesting hot water to make tea. Calm with pleasant demeanor, conversational. Continues to report increased blurriness of vision in L eye. Pills scanned and provided to pt to take at her own pace in order to minimize n/v as discussed during rounds yesterday. Requesting another antiemetic until zofran can be given again; MD notified.  1006 - pt ambulating about room on the phone; called out requesting PRN for anxiety. Still working on getting pills down, asking "What are they going to do about the migraine?" MRI was d/w providers during rounds; reiterated with pt. Advised pt to prioritize tylenol after getting PRN klonopin down. Asking to d/c erythromycin/zofran to reorder phenergan/benadryl. Reporting claustrophobia and need for MRI premeds. Number for pt experience provided to pt at her request.  1117 - pt tearful with single episode of unwitnessed emesis. Approx 150cc mostly consistent with broth pt had for breakfast; no solids appreciated. PRN for hypertension given; awaiting migraine cocktail from pharmacy. Pt requesting to be redosed with anxiolytic s/p emesis, but again no solids were found in emesis to suggest loss of klonopin administered over an hour ago.  1140 - emesis x 1; unchanged in quality  1208 - pt resting calmly in bed on the phone. Ativan given for pre MRI and PRN dilaudid for pain.  1230 - to MRI with transporter. Resting calm in bed.  1303 - back from MRI, reconnected to tele. Migraine cocktail started. Pt reporting bladder stabbing sensation again.  Permission from MD and unit charge to take pt for nurse assisted-visit with dtrs outside (current system-wide age restriction). Pt left to rest in hopes of self-soothing current sympathetic response. Beta blocker requested.    1327 - Pt called out for emesis x 2; 100cc unchanged in quality. Hoping migraine cocktail kicks in so she can hold down klonopin. Beta blocker deferred for now. Pt sobbing; plan reviewed with pt before leaving her to rest.   1350 - received call from pt's husband asking to speak with provider about possibly taking pt home today. Msg relayed.  Klonopin administered. Pt resting in bed on her side. Gingerale provided at her request.  [id number] - pt called out to report emesis  1523 - calling out for pain meds. Stabbing feeling in stomach continues. Pt advised not yet time for PRNs. Offered to give tylenol early, but she states she's throwing up still. Husband at bedside.  1633 - pt quietly tearful asking for PRN for anxiety, second HA cocktail. Husband ending his visit. Privacy provided.  1712 - pt inconsolable after learning of family's plan to have her d/c'd tomorrow to seek tx at a facility in [facility]; hydralazine given to treat sympathetic response and pt provided with a calm quiet environment to hopefully self-soothe.  Pt states she's paranoid that her husband may attempt to claim abandonment ("I don't know if my mind is playing tricks on me. I don't know who I can trust"). I reminded the pt that she is her own decision maker and, when asked if we could knock her out to sleep, I encouraged her that we need her alert and oriented so that she can continue to communicate her goals/desires to [place] directly. I called the pt's sister, Angie, with pt's permission, for clarification. Angie states plan was her idea to get pt  closer to family while allowing husband to continue care of couple's children. Family feels that pt's dtrs are in an established routine that may be upset by seeing their mother in distress, and in this regard they feel there would be no difference in receiving tx in [place] vs [place]. Moving to [facility] would place pt closer to her sister's home and family feels they will be able to provide her more supportive  presence this way. Sister, Janace Hoard, denies any personal concerns that pt's husband would connive to alter parental rights or in any way not in the best interest of the pt. Family has encouraged pt to video chat with her dtrs. Sister en route to continue discussion with pt.  1802 - Pt called out to report emesis; remains inconsolable.  1834 - quieting down  1900 - called out asking where pain med is; pt informed of shift change and pending rounds for bedside handoff  1920 - pt's mom calling unit asking about plan for pain ctrl. Updated mom on d/c of IV pain meds and pt request to change to oxy. Mom en route with sister to visit at bedside.

## 2022-05-04 NOTE — Progress Notes (Signed)
Patient unable to complete exam, patient complains of the machine making her head hurt. Limited study.

## 2022-05-05 ENCOUNTER — Other Ambulatory Visit (HOSPITAL_COMMUNITY): Payer: Self-pay

## 2022-05-05 ENCOUNTER — Encounter (HOSPITAL_COMMUNITY): Payer: Self-pay | Admitting: Gastroenterology

## 2022-05-05 DIAGNOSIS — N390 Urinary tract infection, site not specified: Secondary | ICD-10-CM | POA: Insufficient documentation

## 2022-05-05 DIAGNOSIS — R339 Retention of urine, unspecified: Secondary | ICD-10-CM | POA: Insufficient documentation

## 2022-05-05 DIAGNOSIS — N3010 Interstitial cystitis (chronic) without hematuria: Secondary | ICD-10-CM | POA: Insufficient documentation

## 2022-05-05 DIAGNOSIS — G8929 Other chronic pain: Secondary | ICD-10-CM

## 2022-05-05 LAB — BASIC METABOLIC PANEL
Anion gap: 10 (ref 5–15)
BUN: 15 mg/dL (ref 6–20)
CO2: 25 mmol/L (ref 22–32)
Calcium: 8.7 mg/dL — ABNORMAL LOW (ref 8.9–10.3)
Chloride: 102 mmol/L (ref 98–111)
Creatinine, Ser: 0.83 mg/dL (ref 0.44–1.00)
GFR, Estimated: >60 mL/min (ref >60)
Glucose, Bld: 109 mg/dL — ABNORMAL HIGH (ref 70–99)
Potassium: 3.4 mmol/L — ABNORMAL LOW (ref 3.5–5.1)
Sodium: 137 mmol/L (ref 135–145)

## 2022-05-05 LAB — PORPHYRINS, FRACTIONATED URINE (TIMED COLLECTION)
Coproporphyrin I: 5 ug/L
Coproporphyrin III: 35 ug/L
Heptacarboxyl (7-CP): <1 ug/L
Hexacarboxyl (6-CP): <1 ug/L
Pentacarboxyl (5-CP): <1 ug/L
Uroporphyrins (UP): 4 ug/L

## 2022-05-05 LAB — CBC
HCT: 38.6 % (ref 36.0–46.0)
Hemoglobin: 12.8 g/dL (ref 12.0–15.0)
MCH: 31.8 pg (ref 26.0–34.0)
MCHC: 33.2 g/dL (ref 30.0–36.0)
MCV: 95.8 fL (ref 80.0–100.0)
Platelets: 299 10*3/uL (ref 150–400)
RBC: 4.03 MIL/uL (ref 3.87–5.11)
RDW: 12.4 % (ref 11.5–15.5)
WBC: 8.1 10*3/uL (ref 4.0–10.5)
nRBC: 0 % (ref 0.0–0.2)

## 2022-05-05 LAB — GLUCOSE, CAPILLARY: Glucose-Capillary: 84 mg/dL (ref 70–99)

## 2022-05-05 LAB — ALDOSTERONE + RENIN ACTIVITY W/ RATIO
ALDO / PRA Ratio: 2.7 (ref 0.0–30.0)
Aldosterone: 16.7 ng/dL (ref 0.0–30.0)
PRA LC/MS/MS: 6.086 ng/mL/hr — ABNORMAL HIGH (ref 0.167–5.380)

## 2022-05-05 LAB — SURGICAL PATHOLOGY

## 2022-05-05 MED ORDER — LEVOTHYROXINE SODIUM 150 MCG PO TABS
150.0000 ug | ORAL_TABLET | Freq: Every day | ORAL | 0 refills | Status: DC
  Filled 2022-05-05: qty 30, 30d supply, fill #0

## 2022-05-05 MED ORDER — SORBITOL 70 % SOLN
960.0000 mL | TOPICAL_OIL | Freq: Once | ORAL | Status: AC
  Administered 2022-05-05: 960 mL via RECTAL
  Filled 2022-05-05: qty 240

## 2022-05-05 MED ORDER — CEFDINIR 300 MG PO CAPS
300.0000 mg | ORAL_CAPSULE | Freq: Two times a day (BID) | ORAL | 0 refills | Status: AC
  Filled 2022-05-05: qty 6, 3d supply, fill #0

## 2022-05-05 MED ORDER — OXYCODONE HCL 10 MG PO TABS
10.0000 mg | ORAL_TABLET | ORAL | 0 refills | Status: AC | PRN
  Filled 2022-05-05: qty 40, 7d supply, fill #0

## 2022-05-05 MED ORDER — DIPHENHYDRAMINE HCL 25 MG PO CAPS: 25.0000 mg | ORAL_CAPSULE | Freq: Once | ORAL | Status: DC | PRN

## 2022-05-05 MED ORDER — POLYETHYLENE GLYCOL 3350 17 G PO PACK: 17.0000 g | PACK | Freq: Every day | ORAL | Status: DC

## 2022-05-05 MED ORDER — LIDOCAINE 5 % EX PTCH
1.0000 | MEDICATED_PATCH | CUTANEOUS | 0 refills | Status: DC
  Filled 2022-05-05 (×2): qty 30, 30d supply, fill #0

## 2022-05-05 MED ORDER — FLUOXETINE HCL 20 MG PO CAPS
20.0000 mg | ORAL_CAPSULE | Freq: Every day | ORAL | 0 refills | Status: DC
  Filled 2022-05-05: qty 30, 30d supply, fill #0

## 2022-05-05 MED ORDER — ONDANSETRON HCL 4 MG PO TABS
4.0000 mg | ORAL_TABLET | Freq: Three times a day (TID) | ORAL | 0 refills | Status: DC | PRN
  Filled 2022-05-05: qty 30, 10d supply, fill #0

## 2022-05-05 MED ORDER — HYDROCORTISONE 10 MG PO TABS
10.0000 mg | ORAL_TABLET | Freq: Two times a day (BID) | ORAL | 0 refills | Status: DC
  Filled 2022-05-05: qty 60, 30d supply, fill #0

## 2022-05-05 MED ORDER — OXYCODONE HCL 5 MG PO TABS
10.0000 mg | ORAL_TABLET | ORAL | Status: DC | PRN
  Administered 2022-05-05: 10 mg via ORAL
  Filled 2022-05-05: qty 2

## 2022-05-05 MED ORDER — PANTOPRAZOLE SODIUM 40 MG PO TBEC
40.0000 mg | DELAYED_RELEASE_TABLET | Freq: Two times a day (BID) | ORAL | 0 refills | Status: DC
  Filled 2022-05-05: qty 60, 30d supply, fill #0

## 2022-05-05 MED ORDER — DIPHENHYDRAMINE HCL 50 MG/ML IJ SOLN
25.0000 mg | Freq: Once | INTRAMUSCULAR | Status: AC | PRN
  Administered 2022-05-05: 25 mg via INTRAVENOUS
  Filled 2022-05-05: qty 1

## 2022-05-05 MED ORDER — IRBESARTAN 300 MG PO TABS
300.0000 mg | ORAL_TABLET | Freq: Every day | ORAL | 0 refills | Status: DC
  Filled 2022-05-05: qty 30, 30d supply, fill #0

## 2022-05-05 MED ORDER — POTASSIUM CHLORIDE CRYS ER 20 MEQ PO TBCR: 40.0000 meq | EXTENDED_RELEASE_TABLET | Freq: Once | ORAL | Status: DC

## 2022-05-05 NOTE — TOC Transition Note (Signed)
Transition of Care [facility]) - CM/SW Discharge Note   Patient Details  Name: Krystal [name] MRN: [id number] Date of Birth: 10-30-1990  Transition of Care [facility]) CM/SW Contact:  Bethena Roys, RN Phone Number: 05/05/2022, 11:32 AM   Clinical Narrative: Plan will be for the patient to discharge today. Case Manager completed MATCH for medication assistance. No further needs identified at this time.   Final next level of care: Home/Self Care Barriers to Discharge: No Barriers Identified  Discharge Plan and Services Additional resources added to the After Visit Summary for   In-house Referral: PCP / [facility] Discharge Planning Services: CM Consult, Follow-up appt scheduled, MATCH Program Post Acute Care Choice: NA          DME Arranged: N/A    Social Determinants of Health (SDOH) Interventions SDOH Screenings   Food Insecurity: No Food Insecurity (04/24/2022)  Recent Concern: [place] Present (03/12/2022)  Housing: Low Risk  (04/24/2022)  Transportation Needs: No Transportation Needs (04/24/2022)  Recent Concern: Transportation Needs - Unmet Transportation Needs (03/12/2022)  Utilities: Not At Risk (04/24/2022)  Recent Concern: Utilities - At Risk (03/12/2022)  Alcohol Screen: Low Risk  (04/09/2022)  Depression (PHQ2-9): High Risk (11/11/2021)  Physical Activity: Inactive (04/09/2022)  Tobacco Use: Low Risk  (04/23/2022)   Readmission Risk Interventions    04/30/2022    3:03 PM  Readmission Risk Prevention Plan  Transportation Screening Complete  Medication Review ([place]) Referral to Pharmacy  [place] or [place] Complete  SW Recovery Care/Counseling Consult Complete  Palliative Care Screening Not [place] Not Applicable

## 2022-05-05 NOTE — Anesthesia Postprocedure Evaluation (Signed)
Anesthesia Post Note  Patient: Krystal [name]  Procedure(s) Performed: FLEXIBLE SIGMOIDOSCOPY BIOPSY     Patient location during evaluation: Endoscopy Anesthesia Type: MAC Level of consciousness: awake Pain management: pain level controlled Vital Signs Assessment: post-procedure vital signs reviewed and stable Respiratory status: spontaneous breathing, nonlabored ventilation and respiratory function stable Cardiovascular status: blood pressure returned to baseline and stable Postop Assessment: no apparent nausea or vomiting Anesthetic complications: no   No notable events documented.  Last Vitals:  Vitals:   05/04/22 2354 05/05/22 0623  BP: 121/69 124/77  Pulse: 77 88  Resp: 18 16  Temp: 36.6 C 36.6 C  SpO2: 98% 100%    Last Pain:  Vitals:   05/05/22 0706  TempSrc:   PainSc: 8                  Caymen Dubray P Labrian Torregrossa

## 2022-05-05 NOTE — Progress Notes (Addendum)
Discharge instructions reviewed with pt and family members at bedside.  Copy of instructions given to pt.   Pt going home with foley catheter in place, pt changed over to a urinary catheter leg bag. Educated on how to disconnect and connect the catheter tubing to the leg bag, to apply leg bag, empty leg bag and use the leg straps to support leg bag onto her leg. Also educated on using large catheter bag at night as it will hold more urine and will keep the urine from backing up, as will happen if sleeps with the smaller leg bag.  Pt and family verbalized understanding and pt able to verbalize and demo how to open and close the leg bag port to empty. Pt given foley catheter cleaning wipes to clean her catheter. Pt states she knows how as she has watched staff here, instructions also on the cleansing wipes.   Pt is waiting for husband to arrive to the hospital with other belongings as she plans to leave from this hospital and go to [place] to a hospital there with the family members in the room (?mother and sister).   Pt ready to discharge when husband arrives.   [facility] filled scripts and will be picked up on way out or delivered to pt before discharge.

## 2022-05-05 NOTE — Discharge Summary (Addendum)
Name: Krystal [name] MRN: [id number] DOB: [birth year] 32 y.o. PCP: Patient, No Pcp Per  Date of Admission: 04/23/2022  5:41 PM Date of Discharge: 05/05/2022 Attending Physician: Velna Ochs, MD  Discharge Diagnosis:  1. Principal Problem: Adrenal insufficiency (HCC) Active Problems: Bradycardia   Hypothyroidism due to Hashimoto's thyroiditis Abdominal pain, chronic, right lower quadrant   Primary hypertension   Nausea and vomiting   Lower abdominal pain   Urinary retention   Chronic interstitial cystitis   UTI (urinary tract infection)  Discharge Medications: Allergies as of 05/05/2022       Reactions   Bee Venom Anaphylaxis, Swelling   Swelling of face, throat. Airway restriction.   Coreg [carvedilol] Shortness Of Breath, Other (See Comments)   Triggers asthma   Red Dye Other (See Comments)   Migraines   Compazine [prochlorperazine Maleate] Other (See Comments)   Twitching Able to tolerate promethazine   Oxycodone    Nausea/vomiting   Vicodin [hydrocodone-acetaminophen] Itching, Nausea And Vomiting, Other (See Comments)   OK with Tylenol   Cymbalta [duloxetine Hcl] Other (See Comments)   Irritability  Syncope    Ditropan [oxybutynin] Other (See Comments)   Severe dry mouth   Elavil [amitriptyline] Other (See Comments)   Irritability    Motrin [ibuprofen] Other (See Comments)   Heartburn    Naprosyn [naproxen] Other (See Comments)   Heartburn   Pyrimethamine Other (See Comments)   From Fansidar combination drug Triggers asthma   Sulfadoxine Other (See Comments)   From Fansidar combination drug Triggers asthma    Zestril [lisinopril] Cough   Nifedical Xl [nifedipine] Nausea And Vomiting   Reglan [metoclopramide] Swelling, Other (See Comments)   Restlessness Able to take with Benadryl   Trandate [labetalol] Nausea And Vomiting, Other (See Comments)   Triggers asthma         Medication List     STOP taking these medications    cetirizine  10 MG tablet Commonly known as: ZyrTEC Allergy   cloNIDine 0.3 mg/24hr patch Commonly known as: CATAPRES - Dosed in mg/24 hr   gabapentin 300 MG capsule Commonly known as: NEURONTIN   hydrOXYzine 25 MG tablet Commonly known as: ATARAX   ondansetron 4 MG disintegrating tablet Commonly known as: ZOFRAN-ODT   promethazine 25 MG suppository Commonly known as: PHENERGAN   propranolol 20 MG tablet Commonly known as: INDERAL   spironolactone 25 MG tablet Commonly known as: ALDACTONE   valsartan 320 MG tablet Commonly known as: DIOVAN       TAKE these medications    acetaminophen 500 MG tablet Commonly known as: TYLENOL Take 1,000 mg by mouth daily as needed for moderate pain, headache or fever.   amLODipine 10 MG tablet Commonly known as: NORVASC Take 1 tablet (10 mg total) by mouth daily.   cefdinir 300 MG capsule Commonly known as: OMNICEF Take 1 capsule (300 mg total) by mouth 2 (two) times daily for 3 days.   chlorthalidone 25 MG tablet Commonly known as: HYGROTON Take 1 tablet (25 mg total) by mouth daily.   clonazePAM 1 MG tablet Commonly known as: KlonoPIN Take 1 tablet (1 mg total) by mouth 3 (three) times daily as needed for anxiety (anxiety). What changed: reasons to take this   diphenhydrAMINE 25 MG tablet Commonly known as: BENADRYL Take 25 mg by mouth daily as needed for allergies.   EPINEPHrine 0.3 mg/0.3 mL Soaj injection Commonly known as: EPI-PEN Inject 0.3 mg into the muscle as needed for anaphylaxis.   famotidine  20 MG tablet Commonly known as: PEPCID Take 1 tablet (20 mg total) by mouth 2 (two) times daily.   FLUoxetine 20 MG capsule Commonly known as: PROZAC Take 1 capsule (20 mg total) by mouth daily. What changed:  medication strength See the new instructions.   hydrocortisone 10 MG tablet Commonly known as: CORTEF Take 1 tablet (10 mg total) by mouth 2 (two) times daily.   irbesartan 300 MG tablet Commonly known as:  AVAPRO Take 1 tablet (300 mg total) by mouth daily.   levothyroxine 150 MCG tablet Commonly known as: SYNTHROID Take 1 tablet (150 mcg total) by mouth daily at 6 (six) AM. What changed:  medication strength how much to take when to take this   lidocaine 5 % Commonly known as: LIDODERM Place 1 patch onto the skin daily. Remove & Discard patch within 12 hours or as directed by MD   lurasidone 20 MG Tabs tablet Commonly known as: Latuda Take 1 tablet (20 mg total) by mouth daily. Take with food (dinner) What changed:  when to take this additional instructions   multivitamin tablet Take 1 tablet by mouth daily.   ondansetron 4 MG tablet Commonly known as: Zofran Take 1 tablet (4 mg total) by mouth every 8 (eight) hours as needed for nausea or vomiting.   opium-belladonna 16.2-60 MG suppository Commonly known as: B&O SUPPRETTES Place 1 suppository rectally as needed for bladder spasms.   Oxycodone HCl 10 MG Tabs Take 1 tablet (10 mg total) by mouth every 4 (four) hours as needed for up to 7 days for moderate pain or severe pain. What changed:  when to take this reasons to take this additional instructions   pantoprazole 40 MG tablet Commonly known as: PROTONIX Take 1 tablet (40 mg total) by mouth 2 (two) times daily before a meal.   polyethylene glycol powder 17 GM/SCOOP powder Commonly known as: GLYCOLAX/MIRALAX Take 17 g by mouth daily as needed.   Symbicort 80-4.5 MCG/ACT inhaler Generic drug: budesonide-formoterol Inhale 2 puffs into the lungs 2 (two) times daily as needed (shortness of breath).   venlafaxine XR 75 MG 24 hr capsule Commonly known as: EFFEXOR-XR Take 75 mg by mouth daily. Take with 75 mg for a total of 112.5 mg for 7 days before decreasing to 75 mg daily for 7 days and then decrease to 37.5 mg for 7 days before discontinuing What changed: Another medication with the same name was removed. Continue taking this medication, and follow the directions  you see here.   Ventolin HFA 108 (90 Base) MCG/ACT inhaler Generic drug: albuterol INHALE 2 PUFFS INTO THE LUNGS EVERY 4 HOURS AS NEEDED FOR WHEEZING OR SHORTNESS OR BREATH What changed: See the new instructions.        Disposition and follow-up:   Krystal [name] was discharged from [facility] in [place] condition.  At the hospital follow up visit please address:  1.  Adrenal Insufficiency: Newly diagnosed this admission; work up consistent with secondary AI. Discharged on oral hydrocortisone 10 mg BID. Renin / aldo level pending on discharge. Instructed to follow up with endocrinology (Dr. Buddy Duty).   2. Bradycardia: Due to treatment with propranolol and adrenal insuffiency. Improved after discontinuing AV nodal blocking agents and treatment of her AI. Propanolol is discontinued this admission.   3. Hypothyroidism: Synthroid increased this admission; please repeat TSH in 4-6 weeks.   4. Chronic Lower Abdominal Pain: Unclear etiology; has undergone extensive GI and urology work up over the  past few years. Biopsies from flexible sigmoidoscopy this admission and fecal elastase pending on discharge. Please follow up.   5. Urinary Retention / Refractory interstitial cystitis (?): Discharged with foley in place. Needs urology follow up.   2.  Labs / imaging needed at time of follow-up: BMP, TSH  3.  Pending labs/ test needing follow-up: fecal elastase, aldo/renin ratio, biopsies from flexible sigmoidoscopy   Follow-up Appointments:  Follow-up Information     Armbruster, Carlota Raspberry, MD. Schedule an appointment as soon as possible for a visit.   Specialty: Gastroenterology Contact information: [place] [address] [phone]         Delrae Rend, MD. Schedule an appointment as soon as possible for a visit.   Specialty: Endocrinology Contact information: [address] [place] [address] [phone]          Genia Harold, MD Follow up on 05/06/2022.   Specialty: Neurology Contact information: [address], [address] [place] [address] [phone]         Vista Mink, MD Follow up on 05/07/2022.   Specialty: Psychiatry Contact information: [place] [address] [id number]         Aletha Halim, MD Follow up on 05/21/2022.   Specialty: Obstetrics and Gynecology Contact information: [place] [address] [phone]         Domingo Pulse, MD. Schedule an appointment as soon as possible for a visit.   Specialty: Urology Contact information: [place] [address] [phone]         Freada Bergeron, MD Follow up on 06/09/2022.   Specialties: Cardiology, Radiology Contact information: [id number] [place] [address] [phone]         [facility]. Schedule an appointment as soon as possible for a visit in 1 week(s).   Contact information: [address] [place] Hospital Course by problem list:  1. 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 She was taking multiple antihypertensives for her resistant hypertension including propranolol and clonidine which were held on admission. Cardiology was consulted. Bradycardia ultimately improved with treatment of her adrenal insufficiency, which was newly diagnosed this admission.    2. Secondary Adrenal Insufficiency: Newly diagnosed this admission. 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 MRI of her pituitary gland showed no abnormality. She also had a CT abdomen / pelvis on admission with normal appearing adrenal glands. All testing was collected prior to administration of steroids. She was treated with stress dose IV hydrocortisone for a few days given her bradycardia and abdominal symptoms then transitioned to PO hydrocortisone 10 mg BID. Bradycardia improved. Case was discussed with endocrinology (Dr. Buddy Duty) who will follow up with patient after discharge. Aldo + renin activity still pending on discharge, but clinically no  evidence of aldosterone deficiency and expect this to be normal given work up consistent with secondary AI and not addision's. Estradiol, FSH, and prolactin were all within normal limits.   3. Hypothyroidism: TSH was elevated to 9.236 on admission, free T4 was normal at 0.71. Her synthroid was increased to 150 mcg daily. Will need repeat TFTs in 4-6 weeks.   4. Acute on Chronic Lower Abdominal Pain / Refractory interstitial nephritis (?): Patient has chronic lower abdominal pain of unclear etiology for which she has undergone extensive workup and multiple procedures.  She is status post laparoscopic cholecystectomy and appendectomy in 2022.  Most recently she underwent left ovarian cystectomy for 6 cm hemorrhagic ovarian cyst on 04/22/22.  Unfortunately none of these procedures resulted in improvement of her symptoms.  She had an upper endoscopy in 2022 that was unremarkable aside from gastritis, biopsies were not taken. She also has issues with urinary retention and was following with urology as an outpatient for possible interstitial cystitis. She failed multiple outpatient treatment options including  trials of amitriptyline, oxybutynin, duloxetine, neuropathic treatment with gabapentin, and even intravesicular anesthetics. On chart review she had a bladder biopsy in December of 2023 that showed benign urothelium & submucosa.  Urology was consulted this admission given her severe symptoms. They did not evaluate the patient in person but did review the medical chart and felt they had exhausted all treatment options. CT abdomen / pelvis with contrast on admission showed foci of free air in the upper abdomen, umbilicus, and lower anterior abdominal wall however this was felt to be related to her recent laparoscopic procedure and there were no other acute findings in the abdomen or pelvis. CBC, CMP, lipase, ESR / CRP, lactic acid, and beta hCG were all normal / negative. She was screened for celiac disease and antibodies were normal, with a normal total IgA level. GI was consulted. She was screened for porphyria which was felt to be unlikely and urine porphobilinogen was negative. She underwent flexible sigmoidoscopy on 3/8 with some internal hemorrhoids otherwise an unremarkable exam. Biopsies were taken and pathology still pending on discharge. Overall GI did not feel like her symptoms were related to her GI track. She underwent an abdominal wall steroid injection / trigger point injection without improvement in her pain. Pancreatic fecal elastase was checked on the day of discharge and pending.   5. Resistant Hypertension: History of resistant hypertension on multiple agents prior to admission. Treatment was initially held given her severe bradycardia and normotensive BP. Ultimately discharged on amlodipine 10, chlorthalidone 25, and irbesartan 300 mg. Was still requiring intermittent PRN IV hydral prior to discharge. Please follow up.   6. Urinary Retention: Patient with acute urinary retention on admission requiring foley catheter placement. Unfortunately failed voiding trial requiring in and out  catheterization. Ultimately discharged with foley catheter in place. Will need urology follow up.   7. UTI: Initial UA on admission was negative / normal with no growth on UA. She required foley catheter placement. Given persistent abdominal symptoms, UA was rechecked on 3/7 with large leukocytes and rare bacteria. Urine culture grew pan sensitive E coli, likely catheter related. She was treated with IV ceftriaxone x 4 days then transitioned to Cefdinir to complete her 7 days treatment course.   8. Refractory Nausea: Treated with multiple antiemetics including zofran, phenergan, IV benadryl, and erythromycin without improvement in her symptoms.   9. 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 Advised miralax  PRN.   HPI Patient was seen at bedside with her family this morning.  She endorses persistent lower abdominal pain.  She states that oxycodone is somewhat relieving her pain but she would like a higher dose.  She has not had a bowel movement for the last 4 days.  She still has nausea but able to tolerate p.o. intake.  Her family will take her to a different hospital to see for a second opinion about her chronic abdominal pain.  Encouraged follow-up with interstitial cystitis specialist Dr. Amalia Hailey as well as endocrinology (Dr. Buddy Duty).  Discharge Exam:   BP (!) 132/96 (BP Location: Left Arm)   Pulse 78   Temp 98.2 F (36.8 C) (Oral)   Resp 16   Ht '5\' 11"'$  (1.803 m)   Wt 103.4 kg   LMP 04/13/2022 (Exact Date) Comment: DOS UPREG NEGATIVE  SpO2 100%   BMI 31.80 kg/m  Discharge exam:  Physical Exam Constitutional:      General: She is in acute distress.     Appearance: She is not toxic-appearing.  HENT:     Head: Normocephalic.  Eyes:     General:        Right eye: No discharge.        Left eye: No discharge.     Conjunctiva/sclera: Conjunctivae normal.  Cardiovascular:     Pulses: Normal pulses.  Abdominal:      Palpations: Abdomen is soft.     Comments: Mild tenderness to palpation of lower abdomen. Hypoactive bowel sound. Foley in place  Musculoskeletal:     Cervical back: Normal range of motion.     Right lower leg: No edema.     Left lower leg: No edema.     Comments: Left hand amputation  Skin:    General: Skin is warm.  Neurological:     Mental Status: She is alert. Mental status is at baseline.      Pertinent Labs, Studies, and Procedures:     Latest Ref Rng & Units 05/05/2022    6:10 AM 05/02/2022    7:06 AM 05/01/2022    2:47 PM  CBC  WBC 4.0 - 10.5 K/uL 8.1  10.5  15.8   Hemoglobin 12.0 - 15.0 g/dL 12.8  15.5  15.4   Hematocrit 36.0 - 46.0 % 38.6  44.6  43.6   Platelets 150 - 400 K/uL 299  330  353        Latest Ref Rng & Units 05/05/2022    6:10 AM 05/04/2022    5:42 AM 05/03/2022    6:37 AM  CMP  Glucose 70 - 99 mg/dL 109  137  98   BUN 6 - 20 mg/dL '15  14  13   '$ Creatinine 0.44 - 1.00 mg/dL 0.83  0.93  0.92   Sodium 135 - 145 mmol/L 137  137  137   Potassium 3.5 - 5.1 mmol/L 3.4  3.5  3.7   Chloride 98 - 111 mmol/L 102  102  100   CO2 22 - 32 mmol/L '25  25  23   '$ Calcium 8.9 - 10.3 mg/dL 8.7  9.0  9.1     CT ABDOMEN PELVIS W CONTRAST  Result Date: 04/24/2022 CLINICAL DATA:  Postop abdominal pain. Procedure to remove cyst on left ovary yesterday. Difficulty urinating. EXAM: CT ABDOMEN AND PELVIS WITH CONTRAST TECHNIQUE: Multidetector CT imaging of the abdomen and pelvis was performed using the standard protocol following bolus administration of intravenous contrast. RADIATION DOSE  REDUCTION: This exam was performed according to the departmental dose-optimization program which includes automated exposure control, adjustment of the mA and/or kV according to patient size and/or use of iterative reconstruction technique. CONTRAST:  12m OMNIPAQUE IOHEXOL 350 MG/ML SOLN COMPARISON:  04/12/2022. FINDINGS: Lower chest: Mild emphysematous changes are present in the lungs. There is  atelectasis at the lung bases. Hepatobiliary: No focal liver abnormality is seen. Mild fatty infiltration of the liver is noted. Status post cholecystectomy. No biliary dilatation. Pancreas: Unremarkable. No pancreatic ductal dilatation or surrounding inflammatory changes. Spleen: Normal in size without focal abnormality. Adrenals/Urinary Tract: The adrenal glands are within normal limits. The kidneys enhance symmetrically. No renal calculus or hydronephrosis. A Foley catheter is present in the urinary bladder in the bladder is decompressed. Stomach/Bowel: Stomach is within normal limits. Appendix is not seen. No evidence of bowel wall thickening, distention, or inflammatory changes. Vascular/Lymphatic: No significant vascular findings are present. No enlarged abdominal or pelvic lymph nodes. Reproductive: The uterus is in-situ.  No adnexal mass. Other: A small amount of free fluid is present in the cul-de-sac. Trace amount of free air is noted in the upper abdomen and at the umbilicus in low anterior abdominal wall subcutaneous tissues, likely related to recent surgery. No abscess or hematoma is seen. Musculoskeletal: No acute osseous abnormality. IMPRESSION: 1. Foci of free air in the upper abdomen, umbilicus, and low anterior abdominal wall, likely related to history of recent surgery. No hematoma or abscess is seen. 2. Trace amount of free fluid in the cul-de-sac. 3. Hepatic steatosis. 4. Foley catheter in the urinary bladder. Electronically Signed   By: LBrett FairyM.D.   On: 04/24/2022 04:37     Discharge Instructions: Discharge Instructions     Call MD for:  difficulty breathing, headache or visual disturbances   Complete by: As directed    Call MD for:  persistant nausea and vomiting   Complete by: As directed    Call MD for:  redness, tenderness, or signs of infection (pain, swelling, redness, odor or green/yellow discharge around incision site)   Complete by: As directed    Call MD for:   severe uncontrolled pain   Complete by: As directed    Call MD for:  temperature >100.4   Complete by: As directed    Diet - low sodium heart healthy   Complete by: As directed    Discharge instructions   Complete by: As directed    1.  Please make sure to follow-up with the endocrinologist, gastroenterologist, urologist-interstitial cystitis, OB/GYN, cardiologist, and your primary doctor once you are discharged.  Their contact information should be in your discharge summary.   2.  Please continue taking hydrocortisone 10 mg twice a day for your adrenal insufficiency  3.  Your Foley catheter should remain in place until you are either at the other hospital or have urology follow-up. You can use the leg bag for ambulation.   4.  Your blood pressure regimen at discharge will be irbesartan 300 mg, amlodipine 10 mg, chlorthalidone 25 mg daily.  This can be modified at future outpatient appointments as needed.  5.  We will be discharging you with a short supply of Zofran for nausea and Oxycodone for pain.  6. Please continue 3 more days of antibiotic Cefdinir for your urinary tract infection.  7. We will schedule a follow up appointment with [facility]. The front desk will reach out to you to make the appointment.  Take care   Increase activity slowly   Complete by: As directed        Signed: Gaylan Gerold, DO 05/05/2022, 10:56 AM   Pager: [phone]

## 2022-05-05 NOTE — Progress Notes (Signed)
Pt requesting IV benadryl for her migraine headache.  Communicating with her provider for request.

## 2022-05-05 NOTE — Progress Notes (Addendum)
TOC Pharmacy medications picked up by SWOT RN and delivered to pt's room, pt/family had given nurse $16 to pay for medications and 0.18cents was given back to pt along with medications and receipt.   Pt ready for discharge, pt will d/c via wheelchair with belongings to be escorted by hospital volunteer.

## 2022-05-06 ENCOUNTER — Ambulatory Visit: Payer: Self-pay | Admitting: Psychiatry

## 2022-05-07 ENCOUNTER — Ambulatory Visit (HOSPITAL_COMMUNITY): Payer: Self-pay | Admitting: Psychiatry

## 2022-05-11 LAB — MISC LABCORP TEST (SEND OUT): Labcorp test code: 504805

## 2022-05-13 ENCOUNTER — Ambulatory Visit (HOSPITAL_BASED_OUTPATIENT_CLINIC_OR_DEPARTMENT_OTHER): Payer: Self-pay

## 2022-05-14 ENCOUNTER — Telehealth (HOSPITAL_COMMUNITY): Payer: Self-pay | Admitting: *Deleted

## 2022-05-14 DIAGNOSIS — F419 Anxiety disorder, unspecified: Secondary | ICD-10-CM

## 2022-05-14 MED ORDER — CLONAZEPAM 1 MG PO TABS: 1.0000 mg | ORAL_TABLET | Freq: Three times a day (TID) | ORAL | 0 refills | Status: DC | PRN

## 2022-05-14 NOTE — Addendum Note (Signed)
Addended by: Charlette Caffey on: 05/14/2022 03:52 PM   Modules accepted: Orders

## 2022-05-14 NOTE — Telephone Encounter (Signed)
Patient called stated she's headed to [facility] for family emergency. And requested refill clonazePAM (KLONOPIN) 1 MG tablet   Last appt : 03/18/22 Next appt:: 06/02/22  [facility] [id number] - [place], [place] - [address] AT [place]

## 2022-05-21 ENCOUNTER — Ambulatory Visit: Payer: Self-pay | Admitting: Obstetrics and Gynecology

## 2022-05-21 ENCOUNTER — Encounter: Payer: Self-pay | Admitting: Internal Medicine

## 2022-05-26 ENCOUNTER — Telehealth (HOSPITAL_COMMUNITY): Payer: Self-pay | Admitting: Psychiatry

## 2022-05-26 DIAGNOSIS — F4310 Post-traumatic stress disorder, unspecified: Secondary | ICD-10-CM

## 2022-05-26 DIAGNOSIS — F332 Major depressive disorder, recurrent severe without psychotic features: Secondary | ICD-10-CM

## 2022-05-26 MED ORDER — LURASIDONE HCL 20 MG PO TABS: 20.0000 mg | ORAL_TABLET | Freq: Every day | ORAL | 1 refills | Status: DC

## 2022-05-26 MED ORDER — FLUOXETINE HCL 40 MG PO CAPS: 40.0000 mg | ORAL_CAPSULE | Freq: Every day | ORAL | 1 refills | Status: DC

## 2022-05-26 NOTE — Telephone Encounter (Signed)
Patient had called the office canceling her appointment due to being out of the state temporarily.  Of note in the interim she was hospitalized again due to adrenal insufficiency, bradycardia, hypothyroidism, and urinary concerns.  These had been occurring following the removal of a cyst on her ovaries in mid February.  During the hospitalization patients propranolol had been discontinued secondary to the bradycardia.  Upon discharge she was to be connected with urology as she is been having trouble with urination, and had been discharged with a Foley catheter.  Appointment however was scheduled too far out leading patient to had to [place] to connect with a urologist there.  She is now scheduled to see somebody in 2 weeks.  After which she will be returning to [place].  In regards to her mood/anxiety symptoms she notes an improvement after tapering off venlafaxine and starting Prozac which is now at 40 mg daily.  She is also taking Latuda 20 mg.  Patient denies any adverse side effects from medications and is interested in continuing on her current regimen.  She also notes that she is decreasing her Klonopin use as the Prozac and Latuda start to better manage her symptoms.  At this time she denies any SI or thoughts of self-harm  Of note patient does not have insurance currently.  We discussed transitioning her to the behavioral health urgent care until she is able to get back on an insurance plan.  Patient was agreeable to this.  We will set patient up with that appointment at behavior health urgent care in around 3 weeks or so.  We will also send a refill of the Prozac and Latuda to the local pharmacy.

## 2022-05-26 NOTE — Telephone Encounter (Signed)
11:37am 05/26/22 Dr. Nelida Gores - This patient called to cancel her appointment due to she has no insurance since 3-weeks ago - I suggested [facility] until she get insurance the patient stated that she is in [place] because she needed medical but also asked if you would still give her prescriptions, I fwd to Michelle./sh.

## 2022-06-02 ENCOUNTER — Ambulatory Visit (HOSPITAL_COMMUNITY): Payer: Self-pay | Admitting: Psychiatry

## 2022-06-02 NOTE — Progress Notes (Signed)
Cardio-Obstetrics Clinic  Follow Up Note   Date:  06/09/2022   ID:  Krystal [name], DOB [birth year], MRN [id number]  PCP:  Patient, No Pcp Per   [facility] Providers Cardiologist:  Meriam Sprague, MD  Electrophysiologist:  None      Referring MD: No ref. provider found   Chief Complaint: HTN  History of Present Illness:    Krystal [name] is a 32 y.o. female [G5P0322] who returns for follow up of HTN.  Patient with extensive history of HTN diagnosed in her teens. Reportedly had secondary work-up at [facility] which was unrevealing (records not available). Also with history of preeclampsia and HELLP syndrome with 2 prior pre-term births at [redacted]w[redacted]d and [redacted]w[redacted]d. She was followed throughout her third pregnancy for difficult to control HTN and persistent nausea and vomiting making it hard to to tolerate PO medications. She ultimately required hospitalization on 06/2021 for severe hypertension, anisocaria and HA. CT/MRI were negative for acute findings. She had a prolonged hospitalization complicated by persistent HTN, nausea/vomiting requiring NGT placement, severe anxiety/depression, and HELLP syndrome. She underwent emergent c-section with bilateral salpingectomy on 07/21/21. She was discharged POD #3.   Was last seen in clinic 08/2021 where her blood pressures remained very elevated. We continued on amlodipine 10mg  daily, hydralazine 100mg  TID, started spironolactone 25mg  and changed labetalol to coreg 25mg  BID. She was referred to Dr. Duke Salvia but did not show up. Also planned to see Pharm D but missed appointments.  Was seen in clinic on 02/2022 where she was having severe HTN and vision loss. Home BP reading >220/130s. Was unable to tolerate PO meds due to vomiting. She was referred [facility] (declined going to [facility]) and was admitted to the ICU for cardene gtt. Hospital course complicated by nausea/vomiting that responded to erythromycin. Discharged on clonidine patch, losartan,  amlodipine and spironolactone. Hydralazine discontinued on discharge. She had a CT of the abdomen/pelvis 02/2022 that had no adrenal adenomas and no evidence of renal artery stenosis.   Readmitted in 04/16/22 for left lower quadrant tenderness and suprapubic pain. Found to have a 6 cm left ovarian cyst with no torsion. Underwent laparoscopic left ovarian cystectomy on 2/27.   Re-presented to ED on 2/28 complaining of lower abdominal and back pain that worsened that AM. She also was having difficulty urinating. Fond to be bradycardic on admission in setting of propranolol and clonidine use. These were held and symptoms resolved. Was diagnosed with adrenal insufficiency on that admission and started on steroids.   Today, the patient feels okay. Blood pressure has improved and is running mainly 100-130s/70-90s. She had stopped her amlodipine temporarily due to low blood pressure but went back on it due to rising BP. Having some lightheadedness with exertion with associated fast heart rates. No syncope, SOB, orthopnea or PND.   She recently went to [place] to visit her sister as a way for a mental break. This has been helpful but has been hard getting back into things.   Prior CV Studies Reviewed: The following studies were reviewed today:  TTE 06/17/21: IMPRESSIONS   1. Global longitudinal strain is -20%. Left ventricular ejection  fraction, by estimation, is 60 to 65%. The left ventricle has normal  function. The left ventricle has no regional wall motion abnormalities.  There is mild left ventricular hypertrophy.  Left ventricular diastolic parameters were normal.   2. Right ventricular systolic function is normal. The right ventricular  size is normal.   3. Mild mitral valve regurgitation.  4. The aortic valve is tricuspid. Aortic valve regurgitation is not  visualized. Aortic valve sclerosis is present, with no evidence of aortic  valve stenosis.   5. The inferior vena cava is normal in  size with greater than 50%  respiratory variability, suggesting right atrial pressure of 3 mmHg.   Past Medical History:  Diagnosis Date   Acute acalculous cholecystitis s/p lap cholecystectomy 05/11/2020 05/11/2020   Angio-edema 01/15/2021   Anxiety    Asthma    Depression    HELLP (hemolytic anemia/elev liver enzymes/low platelets in pregnancy) 12/08/2017   History of borderline personality disorder    History of HELLP syndrome, currently pregnant 04/21/2019   Formatting of this note might be different from the original.  History of HELLP syndrome at 26 weeks 5 days (12/07/2017); admitted to [facility] with BP 200/120s, Plt 52, AST/ALT 471/375  APS evaluation negative (04/2019)   Daily ASA 81 mg   Challenging blood pressure control in current pregnancy      Plan:  - Baseline pre-eclampsia labs this pregnancy within normal limits (Cr: 0.71; urine p:c 0.106)  -    IC (interstitial cystitis)    Kidney stones    Migraines    Necrotizing fasciitis    left hand amputation   PTSD (post-traumatic stress disorder)    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   Severe uncontrolled hypertension 10/08/2017   UTI (urinary tract infection)     Past Surgical History:  Procedure Laterality Date   APPENDECTOMY     BIOPSY  05/10/2020   Procedure: BIOPSY;  Surgeon: Willis Modena, MD;  Location: [facility] ENDOSCOPY;  Service: Endoscopy;;   BIOPSY  05/02/2022   Procedure: BIOPSY;  Surgeon: Benancio Deeds, MD;  Location: [facility] ENDOSCOPY;  Service: Gastroenterology;;   CESAREAN SECTION N/A 12/06/2017   Procedure: CESAREAN SECTION;  Surgeon: Conan Bowens, MD;  Location: [facility] BIRTHING SUITES;  Service: Obstetrics;  Laterality: N/A;   CESAREAN SECTION  07/21/2021   Procedure: CESAREAN SECTION;  Surgeon: Reva Bores, MD;  Location: [facility] LD ORS;  Service: Obstetrics;;   CHOLECYSTECTOMY N/A 05/11/2020   Procedure: SINGLE SITE LAPAROSCOPIC CHOLECYSTECTOMY AND LIVER BIOSY;  Surgeon: Karie Soda, MD;  Location: [facility] ORS;  Service:  General;  Laterality: N/A;   ESOPHAGOGASTRODUODENOSCOPY (EGD) WITH PROPOFOL N/A 05/10/2020   Procedure: ESOPHAGOGASTRODUODENOSCOPY (EGD) WITH PROPOFOL;  Surgeon: Willis Modena, MD;  Location: [facility] ENDOSCOPY;  Service: Endoscopy;  Laterality: N/A;   FLEXIBLE SIGMOIDOSCOPY N/A 05/02/2022   Procedure: FLEXIBLE SIGMOIDOSCOPY;  Surgeon: Benancio Deeds, MD;  Location: [facility] ENDOSCOPY;  Service: Gastroenterology;  Laterality: N/A;   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   HAND RECONSTRUCTION Right    INCISION AND DRAINAGE     LAPAROSCOPIC LYSIS OF ADHESIONS  04/22/2022   Procedure: LAPAROSCOPIC LYSIS OF ADHESIONS;  Surgeon: [place] Bing, MD;  Location: [facility] OR;  Service: Gynecology;;   LAPAROSCOPIC OVARIAN CYSTECTOMY Left 04/22/2022   Procedure: LAPAROSCOPIC LEFT OVARIAN CYSTECTOMY;  Surgeon:  Bing, MD;  Location: [facility] OR;  Service: Gynecology;  Laterality: Left;   NASAL SEPTUM SURGERY     {   OB History     Gravida  5   Para  3   Term  0   Preterm  3   AB  2   Living  2      SAB  2   IAB  0   Ectopic  0   Multiple  0   Live Births  2  Current Medications: Current Meds  Medication Sig   acetaminophen (TYLENOL) 500 MG tablet Take 1,000 mg by mouth daily as needed for moderate pain, headache or fever.   albuterol (VENTOLIN HFA) 108 (90 Base) MCG/ACT inhaler INHALE 2 PUFFS INTO THE LUNGS EVERY 4 HOURS AS NEEDED FOR WHEEZING OR SHORTNESS OR BREATH (Patient taking differently: Inhale 2 puffs into the lungs every 6 (six) hours as needed for wheezing or shortness of breath.)   amLODipine (NORVASC) 10 MG tablet Take 1 tablet (10 mg total) by mouth daily.   chlorthalidone (HYGROTON) 25 MG tablet Take 1 tablet (25 mg total) by mouth daily.   EPINEPHrine 0.3 mg/0.3 mL IJ SOAJ injection Inject 0.3 mg into the muscle as needed for anaphylaxis.   famotidine (PEPCID) 20 MG tablet Take 1 tablet (20 mg total) by mouth 2 (two) times daily.    FLUoxetine (PROZAC) 40 MG capsule Take 1 capsule (40 mg total) by mouth daily.   hydrocortisone (CORTEF) 10 MG tablet Take 1 tablet (10 mg total) by mouth 2 (two) times daily.   irbesartan (AVAPRO) 300 MG tablet Take 1 tablet (300 mg total) by mouth daily.   levothyroxine (SYNTHROID) 150 MCG tablet Take 1 tablet (150 mcg total) by mouth daily at 6 (six) AM.   lidocaine (LIDODERM) 5 % Place 1 patch onto the skin daily. Remove & Discard patch within 12 hours or as directed by MD   lurasidone (LATUDA) 20 MG TABS tablet Take 1 tablet (20 mg total) by mouth daily. Take with food (dinner)   Multiple Vitamin (MULTIVITAMIN) tablet Take 1 tablet by mouth daily.   pantoprazole (PROTONIX) 40 MG tablet Take 1 tablet (40 mg total) by mouth 2 (two) times daily before a meal.   SYMBICORT 80-4.5 MCG/ACT inhaler Inhale 2 puffs into the lungs 2 (two) times daily as needed (shortness of breath).     Allergies:   Bee venom, Coreg [carvedilol], Red dye, Compazine [prochlorperazine maleate], Oxycodone, Vicodin [hydrocodone-acetaminophen], Cymbalta [duloxetine hcl], Ditropan [oxybutynin], Elavil [amitriptyline], Motrin [ibuprofen], Naprosyn [naproxen], Pyrimethamine, Sulfadoxine, Zestril [lisinopril], Nifedical xl [nifedipine], Reglan [metoclopramide], and Trandate [labetalol]   Social History   Socioeconomic History   Marital status: Married    Spouse name: Not on file   Number of children: 3   Years of education: Not on file   Highest education level: 8th grade  Occupational History   Not on file  Tobacco Use   Smoking status: Never   Smokeless tobacco: Never   Tobacco comments:    She tried a cigarette and never smoked again  Vaping Use   Vaping Use: Never used  Substance and Sexual Activity   Alcohol use: Yes    Comment: bottle of wine a week-occ   Drug use: No   Sexual activity: Not Currently    Birth control/protection: Surgical  Other Topics Concern   Not on file  Social History Narrative    Not on file   Social Determinants of Health   Financial Resource Strain: Not on file  Food Insecurity: No Food Insecurity (04/24/2022)   Hunger Vital Sign    Worried About Running Out of Food in the Last Year: Never true    Ran Out of Food in the Last Year: Never true  Recent Concern: Food Insecurity - Food Insecurity Present (03/12/2022)   Hunger Vital Sign    Worried About Running Out of Food in the Last Year: Often true    Ran Out of Food in the Last Year: Often true  Transportation Needs: No Transportation Needs (04/24/2022)   PRAPARE - Administrator, Civil Service (Medical): No    Lack of Transportation (Non-Medical): No  Recent Concern: Transportation Needs - Unmet Transportation Needs (03/12/2022)   PRAPARE - Transportation    Lack of Transportation (Medical): Yes    Lack of Transportation (Non-Medical): Yes  Physical Activity: Inactive (04/09/2022)   Exercise Vital Sign    Days of Exercise per Week: 0 days    Minutes of Exercise per Session: 0 min  Stress: Not on file  Social Connections: Not on file  {    Family History  Problem Relation Age of Onset   Hypertension Mother    Coronary artery disease Mother    Hypertension Father    Stroke Father    Atrial fibrillation Father    Heart failure Maternal Grandfather    Heart attack Maternal Grandfather    {  ROS:   Please see the history of present illness.    As per HPI  All other systems reviewed and are negative.   Labs/EKG Reviewed:    EKG:  No new tracing today  Recent Labs: 06/10/2021: NT-Pro BNP 165 06/27/2021: B Natriuretic Peptide 28.9 04/24/2022: ALT 21; TSH 9.236 04/25/2022: Magnesium 2.0 05/05/2022: BUN 15; Creatinine, Ser 0.83; Hemoglobin 12.8; Platelets 299; Potassium 3.4; Sodium 137   Recent Lipid Panel No results found for: "CHOL", "TRIG", "HDL", "CHOLHDL", "LDLCALC", "LDLDIRECT"  Physical Exam:    VS:  BP (!) 134/106   Pulse 73   Ht 5\' 11"  (1.803 m)   Wt 240 lb 3.2 oz (109 kg)    SpO2 98%   BMI 33.50 kg/m     Wt Readings from Last 3 Encounters:  06/09/22 240 lb 3.2 oz (109 kg)  05/05/22 228 lb (103.4 kg)  04/22/22 238 lb (108 kg)     GEN:  Comfortable, NAD HEENT: Normal NECK: No JVD; No carotid bruits CARDIAC: RRR,  1-2/6 systolic murmur RESPIRATORY:  Clear to auscultation without rales, wheezing or rhonchi  ABDOMEN: Soft, non-tender, non-distended MUSCULOSKELETAL:  Warm, no edema SKIN: Warm and dry NEUROLOGIC:  Alert and oriented x 3 PSYCHIATRIC:  Normal affect   Risk Assessment/Risk Calculators:     ASSESSMENT & PLAN:    #Chronic, Resistant HTN: #History of Pre-Eclampsia and HELLP Syndrome: Patient with long-standing history of chronic hypertension with history of HELLP syndrome and pre-eclampsia in all 3 of her pregnancies. Has had significant difficulty with BP management due to not tolerating PO meds in the setting of vomiting. Has had multiple admissions with hypertensive urgency in this setting. Had recent admission for bradycardia in the setting of clonidine and propranlol as well as newly diagnosed adrenal insufficiency. Currently, BP is much better controlled. Will continue with current regimen -Referred to Dr. Duke Salvia for further work-up and management of HTN  -Continue amlodipine 10mg  daily -Continue chlorthalidone 25mg  daily -Continue irbesartan 300mg  daily -Not on nodal agents due to profound bradycardia during prior admission  #Adrenal Insufficiency: Diagnosed on admission in 04/2022. Followed by Endocrine.   #Anxiety/Depression: Follows closely with psychiatry. Given loss of insurance, she is having difficulty following up. Will check into alternative referral.  .   Patient Instructions  Medication Instructions:   Your physician recommends that you continue on your current medications as directed. Please refer to the Current Medication list given to you today.  *If you need a refill on your cardiac medications before your  next appointment, please call your pharmacy*    Follow-Up: At  [facility], you and your health needs are our priority.  As part of our continuing mission to provide you with exceptional heart care, we have created designated Provider Care Teams.  These Care Teams include your primary Cardiologist (physician) and Advanced Practice Providers (APPs -  Physician Assistants and Nurse Practitioners) who all work together to provide you with the care you need, when you need it.  We recommend signing up for the patient portal called "MyChart".  Sign up information is provided on this After Visit Summary.  MyChart is used to connect with patients for Virtual Visits (Telemedicine).  Patients are able to view lab/test results, encounter notes, upcoming appointments, etc.  Non-urgent messages can be sent to your provider as well.   To learn more about what you can do with MyChart, go to [email or web address].    Your next appointment:   6 month(s)  Provider:   Meriam SpragueHeather E Azzam Mehra, MD         Dispo:  No follow-ups on file.   Medication Adjustments/Labs and Tests Ordered: Current medicines are reviewed at length with the patient today.  Concerns regarding medicines are outlined above.  Tests Ordered: No orders of the defined types were placed in this encounter.  Medication Changes: No orders of the defined types were placed in this encounter.

## 2022-06-04 ENCOUNTER — Other Ambulatory Visit: Payer: Self-pay | Admitting: *Deleted

## 2022-06-04 MED ORDER — HYDROCORTISONE 10 MG PO TABS: 10.0000 mg | ORAL_TABLET | Freq: Two times a day (BID) | ORAL | 0 refills | Status: DC

## 2022-06-04 NOTE — Telephone Encounter (Signed)
Approved 1 month refill of her hydrocortisone BID to last until her appointment with me in May. Thank you!

## 2022-06-04 NOTE — Telephone Encounter (Signed)
Pt called / informed of refill. 

## 2022-06-04 NOTE — Telephone Encounter (Signed)
Pt requesting a refill on Hydrocortisone 10 mg. Pt was seen in the hospital; she has not been seen in our office. Pt states she could not get an appt until May. Appt w/Dr Antony Contras 06/25/22. Pharmacy - [facility] on [address].

## 2022-06-09 ENCOUNTER — Ambulatory Visit: Payer: Self-pay | Attending: Cardiology | Admitting: Cardiology

## 2022-06-09 ENCOUNTER — Encounter: Payer: Self-pay | Admitting: Cardiology

## 2022-06-09 VITALS — BP 134/106 | HR 73 | Ht 71.0 in | Wt 240.2 lb

## 2022-06-09 DIAGNOSIS — Z8751 Personal history of pre-term labor: Secondary | ICD-10-CM

## 2022-06-09 DIAGNOSIS — O10919 Unspecified pre-existing hypertension complicating pregnancy, unspecified trimester: Secondary | ICD-10-CM

## 2022-06-09 DIAGNOSIS — F419 Anxiety disorder, unspecified: Secondary | ICD-10-CM

## 2022-06-09 DIAGNOSIS — I1A0 Resistant hypertension: Secondary | ICD-10-CM

## 2022-06-09 DIAGNOSIS — E2740 Unspecified adrenocortical insufficiency: Secondary | ICD-10-CM

## 2022-06-09 DIAGNOSIS — O09299 Supervision of pregnancy with other poor reproductive or obstetric history, unspecified trimester: Secondary | ICD-10-CM

## 2022-06-09 NOTE — Patient Instructions (Signed)
Medication Instructions:   Your physician recommends that you continue on your current medications as directed. Please refer to the Current Medication list given to you today.  *If you need a refill on your cardiac medications before your next appointment, please call your pharmacy*    Follow-Up: At [facility], you and your health needs are our priority.  As part of our continuing mission to provide you with exceptional heart care, we have created designated Provider Care Teams.  These Care Teams include your primary Cardiologist (physician) and Advanced Practice Providers (APPs -  Physician Assistants and Nurse Practitioners) who all work together to provide you with the care you need, when you need it.  We recommend signing up for the patient portal called "MyChart".  Sign up information is provided on this After Visit Summary.  MyChart is used to connect with patients for Virtual Visits (Telemedicine).  Patients are able to view lab/test results, encounter notes, upcoming appointments, etc.  Non-urgent messages can be sent to your provider as well.   To learn more about what you can do with MyChart, go to [email or web address].    Your next appointment:   6 month(s)  Provider:   Heather E Pemberton, MD       

## 2022-06-18 ENCOUNTER — Encounter (HOSPITAL_BASED_OUTPATIENT_CLINIC_OR_DEPARTMENT_OTHER): Payer: Self-pay

## 2022-06-18 ENCOUNTER — Telehealth: Payer: Self-pay | Admitting: *Deleted

## 2022-06-18 NOTE — Telephone Encounter (Signed)
Contacted regarding PREP Class referral. She stated she is not interested in participating at this time.

## 2022-06-19 ENCOUNTER — Other Ambulatory Visit (HOSPITAL_COMMUNITY): Payer: Self-pay

## 2022-06-19 ENCOUNTER — Ambulatory Visit (INDEPENDENT_AMBULATORY_CARE_PROVIDER_SITE_OTHER): Payer: Self-pay | Admitting: Student

## 2022-06-19 ENCOUNTER — Ambulatory Visit (HOSPITAL_BASED_OUTPATIENT_CLINIC_OR_DEPARTMENT_OTHER): Payer: Self-pay

## 2022-06-19 ENCOUNTER — Encounter: Payer: Self-pay | Admitting: Student

## 2022-06-19 VITALS — BP 171/116 | HR 92 | Temp 98.4°F | Wt 237.2 lb

## 2022-06-19 DIAGNOSIS — I10 Essential (primary) hypertension: Secondary | ICD-10-CM

## 2022-06-19 DIAGNOSIS — E063 Autoimmune thyroiditis: Secondary | ICD-10-CM

## 2022-06-19 DIAGNOSIS — E2740 Unspecified adrenocortical insufficiency: Secondary | ICD-10-CM

## 2022-06-19 DIAGNOSIS — F4310 Post-traumatic stress disorder, unspecified: Secondary | ICD-10-CM

## 2022-06-19 DIAGNOSIS — R001 Bradycardia, unspecified: Secondary | ICD-10-CM

## 2022-06-19 DIAGNOSIS — F419 Anxiety disorder, unspecified: Secondary | ICD-10-CM

## 2022-06-19 DIAGNOSIS — R0601 Orthopnea: Secondary | ICD-10-CM

## 2022-06-19 DIAGNOSIS — N179 Acute kidney failure, unspecified: Secondary | ICD-10-CM

## 2022-06-19 DIAGNOSIS — Z79899 Other long term (current) drug therapy: Secondary | ICD-10-CM

## 2022-06-19 DIAGNOSIS — R600 Localized edema: Secondary | ICD-10-CM

## 2022-06-19 DIAGNOSIS — R339 Retention of urine, unspecified: Secondary | ICD-10-CM

## 2022-06-19 DIAGNOSIS — F332 Major depressive disorder, recurrent severe without psychotic features: Secondary | ICD-10-CM

## 2022-06-19 DIAGNOSIS — E038 Other specified hypothyroidism: Secondary | ICD-10-CM

## 2022-06-19 DIAGNOSIS — I1A0 Resistant hypertension: Secondary | ICD-10-CM

## 2022-06-19 MED ORDER — FLUOXETINE HCL 40 MG PO CAPS
40.0000 mg | ORAL_CAPSULE | Freq: Every day | ORAL | 11 refills | Status: DC
Start: 2022-06-19 — End: 2023-04-27
  Filled 2022-06-19: qty 30, 30d supply, fill #0
  Filled 2022-10-15 – 2022-11-06 (×2): qty 30, 30d supply, fill #1
  Filled 2023-01-26: qty 30, 30d supply, fill #2
  Filled 2023-03-30: qty 30, 30d supply, fill #3

## 2022-06-19 MED ORDER — AMLODIPINE BESYLATE 10 MG PO TABS
10.0000 mg | ORAL_TABLET | Freq: Every day | ORAL | 11 refills | Status: DC
Start: 2022-06-19 — End: 2022-10-06
  Filled 2022-06-19: qty 30, 30d supply, fill #0

## 2022-06-19 MED ORDER — IRBESARTAN 300 MG PO TABS: 300.0000 mg | ORAL_TABLET | Freq: Every day | ORAL | 0 refills | Status: DC

## 2022-06-19 MED ORDER — CHLORTHALIDONE 25 MG PO TABS
25.0000 mg | ORAL_TABLET | Freq: Every day | ORAL | 11 refills | Status: DC
  Filled 2022-06-19: qty 30, 30d supply, fill #0

## 2022-06-19 MED ORDER — CLONAZEPAM 1 MG PO TABS
1.0000 mg | ORAL_TABLET | Freq: Three times a day (TID) | ORAL | 0 refills | Status: DC | PRN
Start: 2022-06-19 — End: 2023-03-13
  Filled 2022-06-19: qty 33, 11d supply, fill #0

## 2022-06-19 MED ORDER — CHLORTHALIDONE 25 MG PO TABS: 25.0000 mg | ORAL_TABLET | Freq: Every day | ORAL | 3 refills | Status: DC

## 2022-06-19 MED ORDER — HYDRALAZINE HCL 10 MG PO TABS
10.0000 mg | ORAL_TABLET | Freq: Three times a day (TID) | ORAL | 11 refills | Status: DC
  Filled 2022-06-19: qty 90, 30d supply, fill #0

## 2022-06-19 MED ORDER — HYDROCORTISONE 10 MG PO TABS
10.0000 mg | ORAL_TABLET | Freq: Two times a day (BID) | ORAL | 11 refills | Status: DC
  Filled 2022-06-19: qty 60, 30d supply, fill #0
  Filled 2022-10-15 – 2022-11-06 (×2): qty 60, 30d supply, fill #1
  Filled 2023-01-26: qty 60, 30d supply, fill #2

## 2022-06-19 MED ORDER — AMLODIPINE BESYLATE 10 MG PO TABS: 10.0000 mg | ORAL_TABLET | Freq: Every day | ORAL | 2 refills | Status: DC

## 2022-06-19 MED ORDER — IRBESARTAN 300 MG PO TABS
300.0000 mg | ORAL_TABLET | Freq: Every day | ORAL | 11 refills | Status: DC
  Filled 2022-06-19: qty 30, 30d supply, fill #0

## 2022-06-19 MED ORDER — HYDROCORTISONE 10 MG PO TABS: 10.0000 mg | ORAL_TABLET | Freq: Two times a day (BID) | ORAL | 0 refills | Status: DC

## 2022-06-19 MED ORDER — CLONAZEPAM 1 MG PO TABS: 1.0000 mg | ORAL_TABLET | Freq: Three times a day (TID) | ORAL | 0 refills | Status: DC | PRN

## 2022-06-19 MED ORDER — FLUOXETINE HCL 40 MG PO CAPS: 40.0000 mg | ORAL_CAPSULE | Freq: Every day | ORAL | 1 refills | Status: DC

## 2022-06-19 MED ORDER — HYDRALAZINE HCL 10 MG PO TABS: 10.0000 mg | ORAL_TABLET | Freq: Three times a day (TID) | ORAL | 3 refills | Status: DC

## 2022-06-19 MED ORDER — LEVOTHYROXINE SODIUM 150 MCG PO TABS: 150.0000 ug | ORAL_TABLET | Freq: Every day | ORAL | 0 refills | Status: DC

## 2022-06-19 NOTE — Patient Instructions (Signed)
It was a pleasure meeting you today  Please start hydralazine 10 mg three times a day in addition to you other medications for blood pressure  Urinary retention I think this is contributing to your pain Please continue to self cath if you are unable to urinate for more the 6 hours I would also recommend cathing prior to bedtime to prevent retention overnight  I think this will help with your pain and blood pressure  I will make a referral to behavioral health  We will check you BMP and TSH and I will call with the results   Please follow up in 2-4 weeks for a blood pressure recheck

## 2022-06-20 LAB — BMP8+ANION GAP
Anion Gap: 18 mmol/L (ref 10.0–18.0)
BUN/Creatinine Ratio: 10 (ref 9–23)
BUN: 9 mg/dL (ref 6–20)
CO2: 22 mmol/L (ref 20–29)
Calcium: 9.9 mg/dL (ref 8.7–10.2)
Chloride: 98 mmol/L (ref 96–106)
Creatinine, Ser: 0.92 mg/dL (ref 0.57–1.00)
Glucose: 81 mg/dL (ref 70–99)
Potassium: 4.4 mmol/L (ref 3.5–5.2)
Sodium: 138 mmol/L (ref 134–144)
eGFR: 84 mL/min/{1.73_m2} (ref >59)

## 2022-06-20 LAB — TSH: TSH: 14 u[IU]/mL — ABNORMAL HIGH (ref 0.450–4.500)

## 2022-06-23 NOTE — Assessment & Plan Note (Signed)
Diagnosed at last admission. 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 MRI of her pituitary gland showed no abnormality. She also had a CT abdomen / pelvis on admission with normal appearing adrenal glands. Aldo + renin activity not consistent with aldosterone deficiency. Has not been able to follow up with endocrinology due to lack of insurance. She and husband are working on completing their tax information and she plans to apply for private insurance after that.  Continue hydrocortisone 10 mg twice daily

## 2022-06-23 NOTE — Assessment & Plan Note (Signed)
Synthroid increased to daily. From due to increased TSH during admission. Will repeat TSH today.

## 2022-06-23 NOTE — Assessment & Plan Note (Signed)
History of chronic suprapubic pain. Extensive workup has been unrevealing however noted to have urinary retention at recent admission. Concern for chronic interstitial cystitis. She was discharged with a foley, however this was removed by urologist she saw while visiting sister in [place]. Has been intermittently self catheterizing about 3 times a week when she feels she is unable to void or has gone more than 6 hours without urinating. Post void residual is 77 today in OV. She may be having retention that is contributing to her abdominal pain. Advised she self cath once prior to bedtime daily and if unable to void for more than 6 hours. She has an appointment with a interstitial cystitis specialist Dr. Logan Bores in September. Can continue tylenol and ibuprofen for pain. Hopeful that pain with improve with relief of retention symptoms.

## 2022-06-23 NOTE — Assessment & Plan Note (Signed)
Normal HR off BB and clonidine. Will continue to avoid these classes of medications.

## 2022-06-23 NOTE — Assessment & Plan Note (Signed)
BP remains elevated today at 171/116. Repots compliance with amlodipine 10 mg daily, chlorthalidone 25 mg daily, and irbesartan 300 mg daily. Having more bladder pain which may be contributing. Will add on hydralazine 10 mg three times a day. Avoid AV nodal blockers due to severe bradycardia in the BB and clonidine. She has a cuff at home. Asked her to call if having low Bps on new medications. Follow up BP in 2 weeks.

## 2022-06-23 NOTE — Assessment & Plan Note (Signed)
Elevated PHQ9 18 and GAD 16. Also with history of PTSD and borderline personality disorder.  Currently taking fluoxetine 40 mg daily and clonazepam 1 mg three times a day. Mood symptoms worse lately as she is having more pain. Tearful during her visit today. Denies SI, HI, or AVD. Previously seeing psychiatry but no longer has insurance. Will place referral to [facility].

## 2022-06-23 NOTE — Assessment & Plan Note (Signed)
AKI noted at most recent admission. Will repeat BMP today.

## 2022-06-23 NOTE — Progress Notes (Signed)
Established Patient Office Visit  Subjective   Patient ID: Krystal [name], female    DOB: 09/22/1990  Age: 32 y.o. MRN: [id number]  Chief Complaint  Patient presents with   Abdominal Pain    Lower abd pain    Hypertension    Krystal [name] is a 32 y.o. person living with a history listed below who presents to clinic for hospital follow up. Please refer to problem based charting for further details and assessment and plan of current problem and chronic medical conditions.     Patient Active Problem List   Diagnosis Date Noted   Urinary retention 05/05/2022   Chronic interstitial cystitis 05/05/2022   UTI (urinary tract infection) 05/05/2022   Chronic diarrhea 04/29/2022   Nausea and vomiting 04/28/2022   Lower abdominal pain 04/28/2022   Adrenal insufficiency (HCC) 04/26/2022   Abdominal pain, chronic, right lower quadrant 04/25/2022   Post-operative pain 04/25/2022   Bradycardia 04/24/2022   Hemorrhagic cyst of left ovary 04/15/2022   AKI (acute kidney injury) (HCC) 03/16/2022   Hypothyroidism 03/11/2022   GERD with esophagitis 03/11/2022   Retinal vein occlusion of right eye 03/11/2022   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 03/05/2022   Moderate persistent asthma with acute exacerbation 12/04/2021   Cervicalgia 10/31/2021   Borderline personality disorder in adult [facility]) 10/21/2021   Anisocoria 07/02/2021   History of excision of intestinal structure 05/23/2021   Attention deficit disorder 04/17/2021   Chronic fatigue syndrome 04/17/2021   Anosmia 04/17/2021   Chronic urticaria 01/15/2021   Local reaction to hymenoptera sting 01/15/2021   Perennial allergic rhinitis 01/15/2021   MDD (major depressive disorder), recurrent episode, severe (HCC) 11/19/2020   Fibromyalgia 05/11/2020   Gastroesophageal reflux disease 05/11/2020   Interstitial cystitis 05/11/2020   PTSD (post-traumatic stress disorder) 05/11/2020   Primary insomnia 05/11/2020    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   Hypothyroidism due to Hashimoto's thyroiditis 10/03/2019   Anxiety 06/16/2019   Asthma 06/07/2019   Obesity 06/07/2019   Lumbar spondylosis 06/23/2018   Sacroiliac inflammation (HCC) 06/23/2018   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   History of upper limb amputation, wrist, 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      Review of Systems  Constitutional:  Positive for chills. Negative for fever.  HENT:  Negative for congestion and sinus pain.   Respiratory:  Negative for sputum production and shortness of breath.   Cardiovascular:  Negative for chest pain and palpitations.  Gastrointestinal:  Negative for abdominal pain, constipation, nausea and vomiting.  Genitourinary:  Negative for frequency and urgency.       Suprapubic pain, retention  Neurological:  Negative for dizziness and focal weakness.  Psychiatric/Behavioral:  Positive for depression. Negative for hallucinations, substance abuse and suicidal ideas. The patient is nervous/anxious.       Objective:     BP (!) 171/116 (BP Location: Right Arm, Patient Position: Sitting, Cuff Size: Small)   Pulse 92   Temp 98.4 F (36.9 C) (Oral)   Wt 237 lb 3.2 oz (107.6 kg)   SpO2 99%   BMI 33.08 kg/m  BP Readings from Last 3 Encounters:  06/19/22 (!) 171/116  06/09/22 (!) 134/106  05/05/22 (!) 132/96      Physical Exam Constitutional:      Appearance: Normal appearance.  HENT:     Head: Normocephalic and atraumatic.     Mouth/Throat:     Mouth: Mucous membranes are moist.     Pharynx: Oropharynx is clear.  Cardiovascular:  Rate and Rhythm: Normal rate and regular rhythm.  Pulmonary:     Effort: Pulmonary effort is normal.     Breath sounds: No rhonchi or rales.  Abdominal:     General: Abdomen is flat. Bowel sounds are normal. There is no distension.     Palpations: Abdomen is soft.     Tenderness: There is abdominal tenderness in the suprapubic area. There is no right CVA tenderness or left CVA  tenderness. Negative signs include Murphy's sign.  Musculoskeletal:        General: Normal range of motion.     Right lower leg: No edema.     Left lower leg: No edema.  Skin:    General: Skin is warm and dry.     Capillary Refill: Capillary refill takes less than 2 seconds.  Neurological:     General: No focal deficit present.     Mental Status: She is alert and oriented to person, place, and time.  Psychiatric:        Behavior: Behavior normal.     Comments: tearful      Results for orders placed or performed in visit on 06/19/22  TSH  Result Value Ref Range   TSH 14.000 (H) 0.450 - 4.500 uIU/mL  BMP8+Anion Gap  Result Value Ref Range   Glucose 81 70 - 99 mg/dL   BUN 9 6 - 20 mg/dL   Creatinine, Ser 4.09 0.57 - 1.00 mg/dL   eGFR 84 >81 XB/JYN/8.29   BUN/Creatinine Ratio 10 9 - 23   Sodium 138 134 - 144 mmol/L   Potassium 4.4 3.5 - 5.2 mmol/L   Chloride 98 96 - 106 mmol/L   CO2 22 20 - 29 mmol/L   Anion Gap 18.0 10.0 - 18.0 mmol/L   Calcium 9.9 8.7 - 10.2 mg/dL    Last metabolic panel Lab Results  Component Value Date   GLUCOSE 81 06/19/2022   NA 138 06/19/2022   K 4.4 06/19/2022   CL 98 06/19/2022   CO2 22 06/19/2022   BUN 9 06/19/2022   CREATININE 0.92 06/19/2022   EGFR 84 06/19/2022   CALCIUM 9.9 06/19/2022   PHOS 3.9 05/03/2022   PROT 6.7 04/24/2022   ALBUMIN 3.7 05/03/2022   LABGLOB 2.1 08/14/2021   AGRATIO 1.9 08/14/2021   BILITOT 0.7 04/24/2022   ALKPHOS 41 04/24/2022   AST 19 04/24/2022   ALT 21 04/24/2022   ANIONGAP 10 05/05/2022      The ASCVD Risk score (Arnett DK, et al., 2019) failed to calculate for the following reasons:   The 2019 ASCVD risk score is only valid for ages 75 to 59    Assessment & Plan:   Problem List Items Addressed This Visit       Cardiovascular and Mediastinum   Resistant hypertension - Primary    BP remains elevated today at 171/116. Repots compliance with amlodipine 10 mg daily, chlorthalidone 25 mg daily,  and irbesartan 300 mg daily. Having more bladder pain which may be contributing. Will add on hydralazine 10 mg three times a day. Avoid AV nodal blockers due to severe bradycardia in the BB and clonidine. She has a cuff at home. Asked her to call if having low Bps on new medications. Follow up BP in 2 weeks.       Relevant Medications   amLODipine (NORVASC) 10 MG tablet   chlorthalidone (HYGROTON) 25 MG tablet   hydrALAZINE (APRESOLINE) 10 MG tablet   irbesartan (AVAPRO) 300 MG tablet  Other Relevant Orders   BMP8+Anion Gap (Completed)     Endocrine   Hypothyroidism due to Hashimoto's thyroiditis    Synthroid increased to daily. From due to increased TSH during admission. Will repeat TSH today.       Relevant Medications   levothyroxine (SYNTHROID) 150 MCG tablet   Other Relevant Orders   TSH (Completed)   Adrenal insufficiency (HCC)    Diagnosed at last admission. 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 MRI of her pituitary gland showed no abnormality. She also had a CT abdomen / pelvis on admission with normal appearing adrenal glands. Aldo + renin activity not consistent with aldosterone deficiency. Has not been able to follow up with endocrinology due to lack of insurance. She and husband are working on completing their tax information and she plans to apply for private insurance after that.  Continue hydrocortisone 10 mg twice daily          Genitourinary   AKI (acute kidney injury) (HCC)    AKI noted at most recent admission. Will repeat BMP today.       Urinary retention    History of chronic suprapubic pain. Extensive workup has been unrevealing however noted to have urinary retention at recent admission. Concern for chronic interstitial cystitis. She was discharged with a foley, however this was  removed by urologist she saw while visiting sister in [place]. Has been intermittently self catheterizing about 3 times a week when she feels she is unable to void or has gone more than 6 hours without urinating. Post void residual is 77 today in OV. She may be having retention that is contributing to her abdominal pain. Advised she self cath once prior to bedtime daily and if unable to void for more than 6 hours. She has an appointment with a interstitial cystitis specialist Dr. Logan Bores in September. Can continue tylenol and ibuprofen for pain. Hopeful that pain with improve with relief of retention symptoms.         Other   Anxiety   Relevant Medications   clonazePAM (KLONOPIN) 1 MG tablet   FLUoxetine (PROZAC) 40 MG capsule   Other Relevant Orders   Ambulatory referral to Behavioral Health   MDD (major depressive disorder), recurrent episode, severe (HCC)    Elevated PHQ9 18 and GAD 16. Also with history of PTSD and borderline personality disorder.  Currently taking fluoxetine 40 mg daily and clonazepam 1 mg three times a day. Mood symptoms worse lately as she is having more pain. Tearful during her visit today. Denies SI, HI, or AVD. Previously seeing psychiatry but no longer has insurance. Will place referral to [facility]      Relevant Medications   FLUoxetine (PROZAC) 40 MG capsule   Bradycardia    Normal HR off BB and clonidine. Will continue to avoid these classes of medications.      Other Visit Diagnoses     Posttraumatic stress disorder       Relevant Medications   FLUoxetine (PROZAC) 40 MG capsule   Medication management       Relevant Medications   amLODipine (NORVASC) 10 MG tablet   Essential hypertension       Relevant Medications   amLODipine (NORVASC) 10 MG tablet   chlorthalidone (HYGROTON) 25 MG tablet   hydrALAZINE (APRESOLINE) 10 MG tablet   irbesartan (AVAPRO) 300  MG tablet       Return in about 2 weeks (around 07/03/2022).    Quincy Simmonds, MD

## 2022-06-25 ENCOUNTER — Encounter: Payer: Self-pay | Admitting: Internal Medicine

## 2022-06-26 ENCOUNTER — Other Ambulatory Visit: Payer: Self-pay

## 2022-06-26 ENCOUNTER — Ambulatory Visit (INDEPENDENT_AMBULATORY_CARE_PROVIDER_SITE_OTHER): Payer: Self-pay | Admitting: Obstetrics and Gynecology

## 2022-06-26 VITALS — BP 142/101 | HR 74 | Ht 71.0 in | Wt 239.1 lb

## 2022-06-26 DIAGNOSIS — R102 Pelvic and perineal pain: Secondary | ICD-10-CM

## 2022-06-26 DIAGNOSIS — N939 Abnormal uterine and vaginal bleeding, unspecified: Secondary | ICD-10-CM

## 2022-06-26 MED ORDER — MEGESTROL ACETATE 40 MG PO TABS
40.0000 mg | ORAL_TABLET | Freq: Two times a day (BID) | ORAL | 1 refills | Status: DC
Start: 2022-06-26 — End: 2022-10-06

## 2022-06-26 NOTE — Progress Notes (Signed)
Obstetrics and Gynecology Visit Return Patient Evaluation  Appointment Date: 06/26/2022  Primary Care Provider: Patient, No Pcp Per  OBGYN Clinic: [facility]  Chief Complaint: pelvic pain and AUB  History of Present Illness:  Krystal [name] is a 32 y.o. who had a period in mid march that was a week and not too heavy or painful. She had another period in [facility] April that has only now gone to just spotting and she's had stable, sharp pelvic pain. No lower urinary tract s/s. She has had a lot of medical issues this year and before mid march she had AUB. She states that the diagnosis of adrenal insufficiency was confirmed and she is on steroids and her synthroid is being adjusted, with her most recent tsh on 4/25 being 14.  Pap and HPV neg 03/2021  Review of Systems: as noted in the History of Present Illness.   Patient Active Problem List   Diagnosis Date Noted   Urinary retention 05/05/2022   Chronic interstitial cystitis 05/05/2022   UTI (urinary tract infection) 05/05/2022   Chronic diarrhea 04/29/2022   Nausea and vomiting 04/28/2022   Lower abdominal pain 04/28/2022   Adrenal insufficiency (HCC) 04/26/2022   Abdominal pain, chronic, right lower quadrant 04/25/2022   Bradycardia 04/24/2022   AKI (acute kidney injury) (HCC) 03/16/2022   GERD with esophagitis 03/11/2022   Retinal vein occlusion of right eye 03/11/2022   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 03/05/2022   Moderate persistent asthma with acute exacerbation 12/04/2021   Cervicalgia 10/31/2021   Borderline personality disorder in adult [facility]) 10/21/2021   Anisocoria 07/02/2021   History of excision of intestinal structure 05/23/2021   Attention deficit disorder 04/17/2021   Chronic fatigue syndrome 04/17/2021   Anosmia 04/17/2021   Chronic urticaria 01/15/2021   Local reaction to hymenoptera sting 01/15/2021   Perennial allergic rhinitis 01/15/2021   MDD  (major depressive disorder), recurrent episode, severe (HCC) 11/19/2020   Fibromyalgia 05/11/2020   Gastroesophageal reflux disease 05/11/2020   Interstitial cystitis 05/11/2020   PTSD (post-traumatic stress disorder) 05/11/2020   Primary insomnia 05/11/2020   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   Hypothyroidism due to Hashimoto's thyroiditis 10/03/2019   Anxiety 06/16/2019   Asthma 06/07/2019   Obesity 06/07/2019   Lumbar spondylosis 06/23/2018   Sacroiliac inflammation (HCC) 06/23/2018   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   History of upper limb amputation, wrist, 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   Medications:  Shanta C. Hereford had no medications administered during this visit. Current Outpatient Medications  Medication Sig Dispense Refill   acetaminophen (TYLENOL) 500 MG tablet Take 1,000 mg by mouth daily as needed for moderate pain, headache or fever.     albuterol (VENTOLIN HFA) 108 (90 Base) MCG/ACT inhaler INHALE 2 PUFFS INTO THE LUNGS EVERY 4 HOURS AS NEEDED FOR WHEEZING OR SHORTNESS OR BREATH (Patient taking differently: Inhale 2 puffs into the lungs every 6 (six) hours as needed for wheezing or shortness of breath.) 18 g 0   amLODipine (NORVASC) 10 MG tablet Take 1 tablet (10 mg total) by mouth daily. IM program 30 tablet 11   chlorthalidone (HYGROTON) 25 MG tablet Take 1 tablet (25 mg total) by mouth daily. IM program 30 tablet 11   clonazePAM (KLONOPIN) 1 MG tablet Take 1 tablet (1 mg total) by mouth 3 (three) times daily as needed for up to 11 days for anxiety. 33 tablet 0   EPINEPHrine 0.3 mg/0.3 mL IJ SOAJ injection Inject 0.3 mg into the muscle  as needed for anaphylaxis. 1 each 2   famotidine (PEPCID) 20 MG tablet Take 1 tablet (20 mg total) by mouth 2 (two) times daily. 60 tablet 5   FLUoxetine (PROZAC) 40 MG capsule Take 1 capsule (40 mg total) by mouth daily. IM program 30 capsule 11   hydrALAZINE (APRESOLINE) 10 MG tablet Take 1 tablet (10 mg total) by mouth 3 (three) times daily.  IM program 90 tablet 11   hydrocortisone (CORTEF) 10 MG tablet Take 1 tablet (10 mg total) by mouth 2 (two) times daily. IM program 60 tablet 11   irbesartan (AVAPRO) 300 MG tablet Take 1 tablet (300 mg total) by mouth daily. IM program 30 tablet 11   levothyroxine (SYNTHROID) 150 MCG tablet Take 1 tablet (150 mcg total) by mouth daily at 6 (six) AM. 30 tablet 0   lidocaine (LIDODERM) 5 % Place 1 patch onto the skin daily. Remove & Discard patch within 12 hours or as directed by MD 30 patch 0   lurasidone (LATUDA) 20 MG TABS tablet Take 1 tablet (20 mg total) by mouth daily. Take with food (dinner) 30 tablet 1   Multiple Vitamin (MULTIVITAMIN) tablet Take 1 tablet by mouth daily.     pantoprazole (PROTONIX) 40 MG tablet Take 1 tablet (40 mg total) by mouth 2 (two) times daily before a meal. 60 tablet 0   SYMBICORT 80-4.5 MCG/ACT inhaler Inhale 2 puffs into the lungs 2 (two) times daily as needed (shortness of breath).     No current facility-administered medications for this visit.    Allergies: is allergic to bee venom, coreg [carvedilol], red dye, compazine [prochlorperazine maleate], oxycodone, vicodin [hydrocodone-acetaminophen], cymbalta [duloxetine hcl], ditropan [oxybutynin], elavil [amitriptyline], motrin [ibuprofen], naprosyn [naproxen], pyrimethamine, sulfadoxine, zestril [lisinopril], nifedical xl [nifedipine], reglan [metoclopramide], and trandate [labetalol].  Physical Exam:  BP (!) 142/101   Pulse 74   Ht 5\' 11"  (1.803 m)   Wt 239 lb 2 oz (108.5 kg)   LMP 06/08/2022   BMI 33.35 kg/m  Body mass index is 33.35 kg/m. General appearance: Well nourished, well developed female in no acute distress.  Neuro/Psych:  Normal mood and affect.    Assessment: patient stable  Plan:  1. Pelvic pain See below  2. Abnormal uterine bleeding (AUB) I told her that having abnormal thyroid levels can cause AUB and it will take some time for her levels to get into the proper level. I also  told her that her AI can also cause AUB. I told her that it's a good sign that her period seemed to have a monthly period which is a likely indicator that she is getting healthy.    As an interim measure, I recommend megace 40 bid with her periods to help with her periods. I told her that I don't feel she needs another ultrasound at this time. I told her that if AUB persist and/or the pelvic pain worsens then to call us for a repeat ultrasound  RTC: PRN   Future Appointments  Date Time Provider Department Center  07/24/2022  8:00 AM [facility]ECHO/VAS [facility]  07/24/2022  9:30 AM [facility]&V PHARMACIST [facility]   Cornelia Copa MD Attending [facility])

## 2022-06-27 ENCOUNTER — Encounter: Payer: Self-pay | Admitting: Obstetrics and Gynecology

## 2022-06-30 ENCOUNTER — Other Ambulatory Visit (HOSPITAL_COMMUNITY): Payer: Self-pay

## 2022-07-01 ENCOUNTER — Telehealth: Payer: Self-pay

## 2022-07-01 NOTE — Telephone Encounter (Signed)
Called patient will have her follow up in clinic tomorrow

## 2022-07-01 NOTE — Telephone Encounter (Signed)
Pt states she is having bladder pain, requesting to speak with Dr. Elaina Pattee. Offered an appt, pt refused stating were not doing anything for her pain. Please call pt back.

## 2022-07-02 ENCOUNTER — Encounter: Payer: Self-pay | Admitting: Student

## 2022-07-02 NOTE — Progress Notes (Signed)
Internal Medicine Clinic Attending  Case discussed with Dr. Liang  at the time of the visit.  We reviewed the resident's history and exam and pertinent patient test results.  I agree with the assessment, diagnosis, and plan of care documented in the resident's note.  

## 2022-07-24 ENCOUNTER — Ambulatory Visit (HOSPITAL_BASED_OUTPATIENT_CLINIC_OR_DEPARTMENT_OTHER): Payer: Self-pay

## 2022-07-24 ENCOUNTER — Encounter (HOSPITAL_BASED_OUTPATIENT_CLINIC_OR_DEPARTMENT_OTHER): Payer: Self-pay

## 2022-07-24 NOTE — Progress Notes (Deleted)
Office Visit    Patient Name: Krystal [name] Date of Encounter: 07/24/2022  Primary Care Provider:  Patient, No Pcp Per Primary Cardiologist:  Meriam Sprague, MD  Chief Complaint    Hypertension - Advanced hypertension clinic  Past Medical History   Retinal vein occlusion Right eye  GERD Had severe n/v associated with pregnancies  asthma   hypothyroidism   Adrenal insufficiency Now on hydrocortisone 10 mg bid    Allergies  Allergen Reactions   Bee Venom Anaphylaxis and Swelling    Swelling of face, throat. Airway restriction.   Coreg [Carvedilol] Shortness Of Breath and Other (See Comments)    Triggers asthma   Red Dye Other (See Comments)    Migraines   Compazine [Prochlorperazine Maleate] Other (See Comments)    Twitching Able to tolerate promethazine   Oxycodone     Nausea/vomiting   Vicodin [Hydrocodone-Acetaminophen] Itching, Nausea And Vomiting and Other (See Comments)    OK with Tylenol   Cymbalta [Duloxetine Hcl] Other (See Comments)    Irritability  Syncope    Ditropan [Oxybutynin] Other (See Comments)    Severe dry mouth   Elavil [Amitriptyline] Other (See Comments)    Irritability    Motrin [Ibuprofen] Other (See Comments)    Heartburn    Naprosyn [Naproxen] Other (See Comments)    Heartburn    Pyrimethamine Other (See Comments)    From Fansidar combination drug Triggers asthma   Sulfadoxine Other (See Comments)    From Fansidar combination drug Triggers asthma    Zestril [Lisinopril] Cough   Nifedical Xl [Nifedipine] Nausea And Vomiting   Reglan [Metoclopramide] Swelling and Other (See Comments)    Restlessness Able to take with Benadryl   Trandate [Labetalol] Nausea And Vomiting and Other (See Comments)    Triggers asthma     History of Present Illness    Krystal [name] is a 32 y.o. female patient who was referred to the Advanced Hypertension Clinic after delivery of her third daughter in 2023.  She was followed by Dr.  Shari Prows during her last pregnancy, which was complicated by severe nausea and vomiting and she was eventually hospitalized at and delivered by emergent C-section at 26 weeks.  She continued to have problems with severe uncontrolled hypertension after delivery, with home readings often > 200/100 and at a visit with Dr. Shari Prows in January, was sent to [facility] hospital with hypertensive emergency.  She saw Dr. Duke Salvia in February and pressure was improved to 162/96.  Secondary screening was done (see below) Blood Pressure Goal:  130/80  Current Medications: valsartan 320 mg qd, chlorthalidone 25 mg qd, clonidine patch, propranolol, spironolactone, amlodipine  Adherence Assessment  Do you ever forget to take your medication? [] Yes [] No  Do you ever skip doses due to side effects? [] Yes [] No  Do you have trouble affording your medicines? [] Yes [] No  Are you ever unable to pick up your medication due to transportation difficulties? [] Yes [] No  Do you ever stop taking your medications because you don't believe they are helping? [] Yes [] No  Do you check your weight daily? [] Yes [] No   Adherence strategy: ***  Barriers to obtaining medications: ***  Previously tried:  labetalol - nausea; carvedilol - asthma; hctz - photosensitivity  Family Hx:     Social Hx:      Tobacco:  Alcohol:  Caffeine:    Diet:      Exercise:   Home BP readings:       Accessory Clinical  Findings    Lab Results  Component Value Date   CREATININE 0.92 06/19/2022   BUN 9 06/19/2022   NA 138 06/19/2022   K 4.4 06/19/2022   CL 98 06/19/2022   CO2 22 06/19/2022   Lab Results  Component Value Date   ALT 21 04/24/2022   AST 19 04/24/2022   ALKPHOS 41 04/24/2022   BILITOT 0.7 04/24/2022   Lab Results  Component Value Date   HGBA1C 5.1 04/01/2021    Screening for Secondary Hypertension: { Click here to document screening for secondary causes of HTN  :1}     04/09/2022    2:39 PM  Causes   Drugs/Herbals Screened     - Comments 1 coffee daily.occasional EtOH. No tob. delta 8 gummies.  Renovascular HTN Screened     - Comments abd CT negative for renal stenosis 02/2022  Sleep Apnea Screened  Thyroid Disease Screened     - Comments TSH high.  Repeat T3 and fT4.  Hyperaldosteronism Screened     - Comments no adenomas.  Check renin/aldo    Relevant Labs/Studies:    Latest Ref Rng & Units 06/19/2022    3:52 PM 05/05/2022    6:10 AM 05/04/2022    5:42 AM  Basic Labs  Sodium 134 - 144 mmol/L 138  137  137   Potassium 3.5 - 5.2 mmol/L 4.4  3.4  3.5   Creatinine 0.57 - 1.00 mg/dL 1.61  0.96  0.45        Latest Ref Rng & Units 06/19/2022    3:52 PM 04/24/2022   12:01 PM  Thyroid   TSH 0.450 - 4.500 uIU/mL 14.000  9.236        Latest Ref Rng & Units 04/30/2022    6:47 PM  Renin/Aldosterone   Aldosterone 0.0 - 30.0 ng/dL 40.9           Latest Ref Rng & Units 04/26/2022    3:55 AM  Cortisol  Cortisol  ug/dL <8.1        1/91/4782    3:11 PM  Renovascular   Renal Artery [place] Completed Yes      Home Medications    Current Outpatient Medications  Medication Sig Dispense Refill   acetaminophen (TYLENOL) 500 MG tablet Take 1,000 mg by mouth daily as needed for moderate pain, headache or fever.     albuterol (VENTOLIN HFA) 108 (90 Base) MCG/ACT inhaler INHALE 2 PUFFS INTO THE LUNGS EVERY 4 HOURS AS NEEDED FOR WHEEZING OR SHORTNESS OR BREATH (Patient taking differently: Inhale 2 puffs into the lungs every 6 (six) hours as needed for wheezing or shortness of breath.) 18 g 0   amLODipine (NORVASC) 10 MG tablet Take 1 tablet (10 mg total) by mouth daily. IM program 30 tablet 11   chlorthalidone (HYGROTON) 25 MG tablet Take 1 tablet (25 mg total) by mouth daily. IM program 30 tablet 11   clonazePAM (KLONOPIN) 1 MG tablet Take 1 tablet (1 mg total) by mouth 3 (three) times daily as needed for up to 11 days for anxiety. 33 tablet 0   EPINEPHrine 0.3 mg/0.3 mL IJ SOAJ injection  Inject 0.3 mg into the muscle as needed for anaphylaxis. 1 each 2   famotidine (PEPCID) 20 MG tablet Take 1 tablet (20 mg total) by mouth 2 (two) times daily. 60 tablet 5   FLUoxetine (PROZAC) 40 MG capsule Take 1 capsule (40 mg total) by mouth daily. IM program 30 capsule 11   hydrALAZINE (  APRESOLINE) 10 MG tablet Take 1 tablet (10 mg total) by mouth 3 (three) times daily. IM program 90 tablet 11   hydrocortisone (CORTEF) 10 MG tablet Take 1 tablet (10 mg total) by mouth 2 (two) times daily. IM program 60 tablet 11   irbesartan (AVAPRO) 300 MG tablet Take 1 tablet (300 mg total) by mouth daily. IM program 30 tablet 11   levothyroxine (SYNTHROID) 150 MCG tablet Take 1 tablet (150 mcg total) by mouth daily at 6 (six) AM. 30 tablet 0   lidocaine (LIDODERM) 5 % Place 1 patch onto the skin daily. Remove & Discard patch within 12 hours or as directed by MD 30 patch 0   lurasidone (LATUDA) 20 MG TABS tablet Take 1 tablet (20 mg total) by mouth daily. Take with food (dinner) 30 tablet 1   megestrol (MEGACE) 40 MG tablet Take 1 tablet (40 mg total) by mouth 2 (two) times daily. For the first seven days of your period. 40 tablet 1   Multiple Vitamin (MULTIVITAMIN) tablet Take 1 tablet by mouth daily.     pantoprazole (PROTONIX) 40 MG tablet Take 1 tablet (40 mg total) by mouth 2 (two) times daily before a meal. 60 tablet 0   SYMBICORT 80-4.5 MCG/ACT inhaler Inhale 2 puffs into the lungs 2 (two) times daily as needed (shortness of breath).     No current facility-administered medications for this visit.     Assessment & Plan   No BP recorded.  {Refresh Note OR Click here to enter BP  :1}***   No problem-specific Assessment & Plan notes found for this encounter.   Phillips Hay PharmD CPP [facility]  [address] [place], [place] [address] [phone]

## 2022-07-25 ENCOUNTER — Telehealth (HOSPITAL_BASED_OUTPATIENT_CLINIC_OR_DEPARTMENT_OTHER): Payer: Self-pay | Admitting: Cardiovascular Disease

## 2022-07-25 NOTE — Telephone Encounter (Signed)
Left message for patient to call and discuss rescheduling the Renal duplex ordered by Dr. Duke Salvia

## 2022-09-10 ENCOUNTER — Encounter (HOSPITAL_BASED_OUTPATIENT_CLINIC_OR_DEPARTMENT_OTHER): Payer: Self-pay

## 2022-09-11 ENCOUNTER — Telehealth (HOSPITAL_BASED_OUTPATIENT_CLINIC_OR_DEPARTMENT_OTHER): Payer: Self-pay | Admitting: *Deleted

## 2022-09-11 NOTE — Telephone Encounter (Signed)
Will forward to Dr [place] so she will be aware ?

## 2022-09-11 NOTE — Telephone Encounter (Signed)
-----   Message from [place] F sent at 09/10/2022  1:46 PM EDT ----- Regarding: Renal Artery Duplex Due to multiple cancellations / no shows,( 05/01/22-05/15/22-06/09/22-07/24/22 and 09/10/22) we will be removing this order from our workqueue.  Thank you.

## 2022-09-17 IMAGING — US US OB < 14 WEEKS - US OB TV
1 series · 15 of 28 positions shown · non-contrast
Comparison: None.

CLINICAL DATA: Vaginal bleeding

EXAM:
OBSTETRIC <14 WK US AND TRANSVAGINAL OB US
TECHNIQUE: Both transabdominal and transvaginal ultrasound examinations were
performed for complete evaluation of the gestation as well as the
maternal uterus, adnexal regions, and pelvic cul-de-sac.
Transvaginal technique was performed to assess early pregnancy.

[Series 1: us ob < 14 weeks - us ob tv · 15 of 42 slices shown]
[im 1/42]
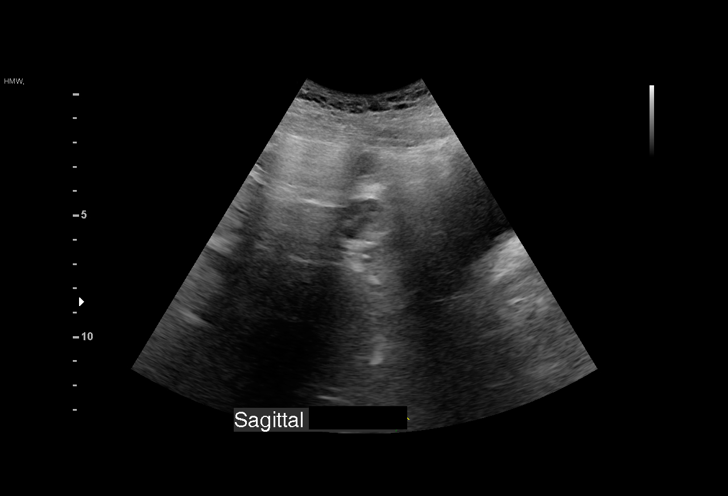
[im 4/42]
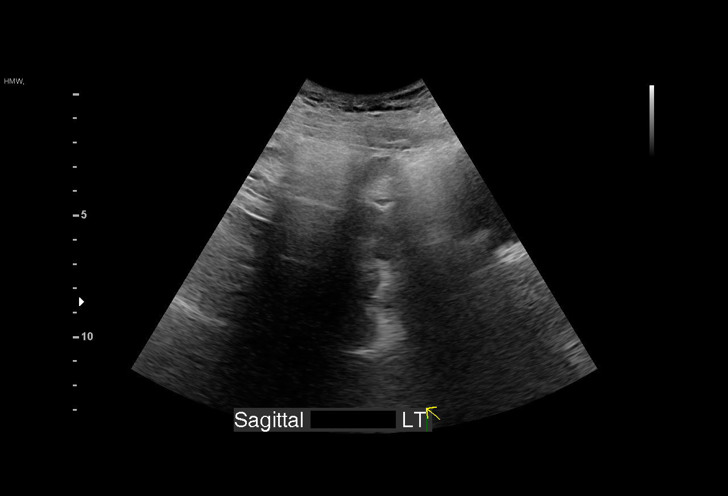
[im 7/42]
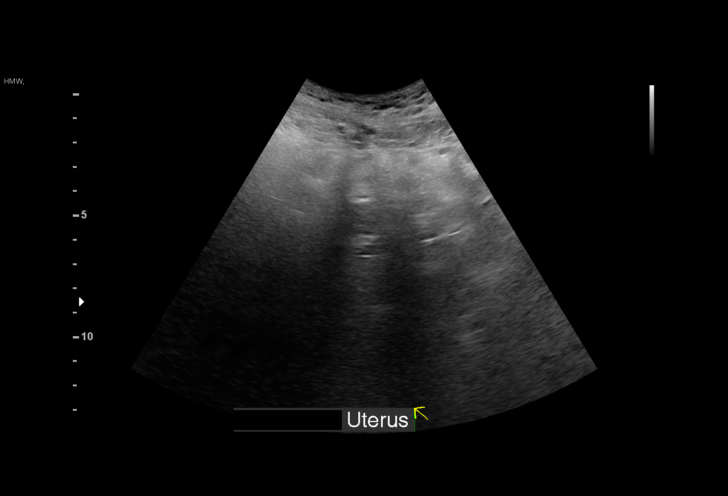
[im 10/42]
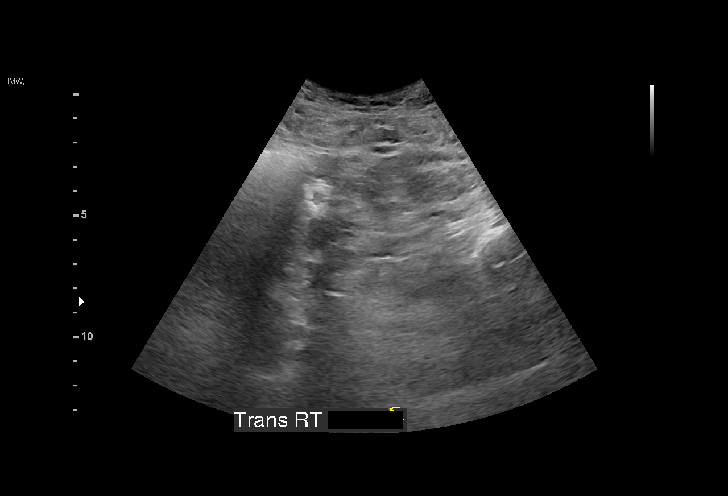
[im 13/42]
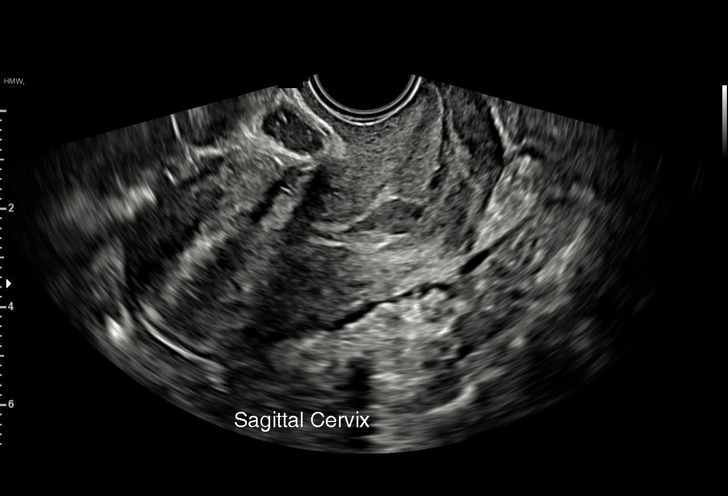
[im 16/42]
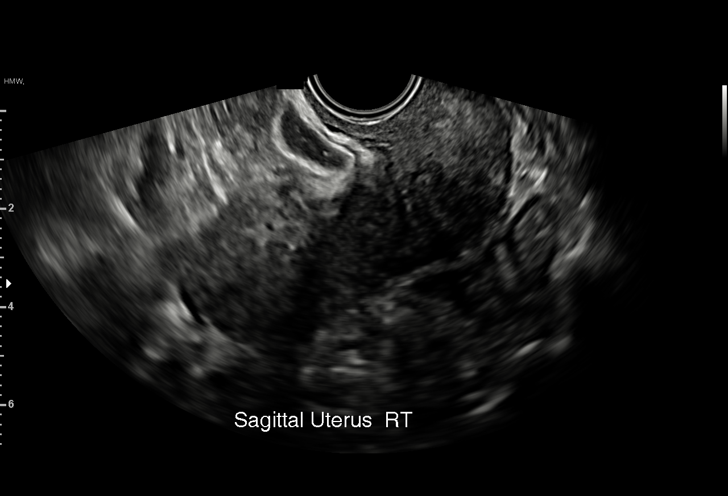
[im 19/42]
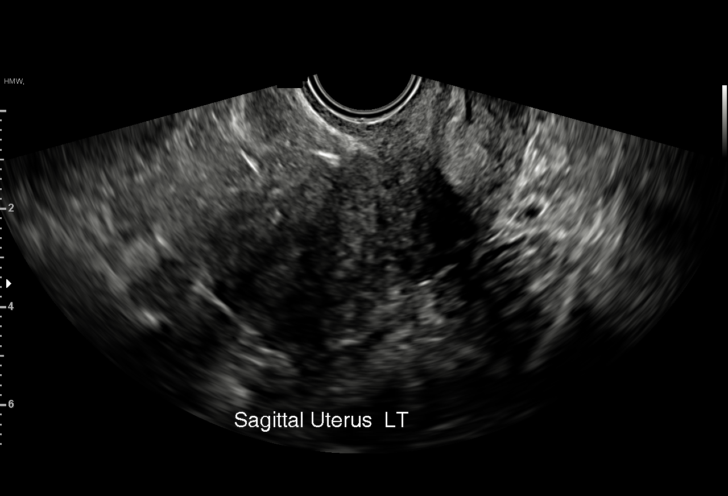
[im 22/42]
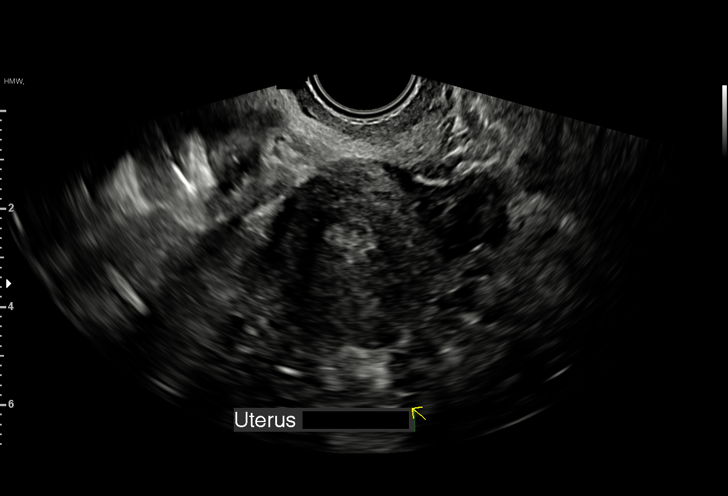
[im 23/42]
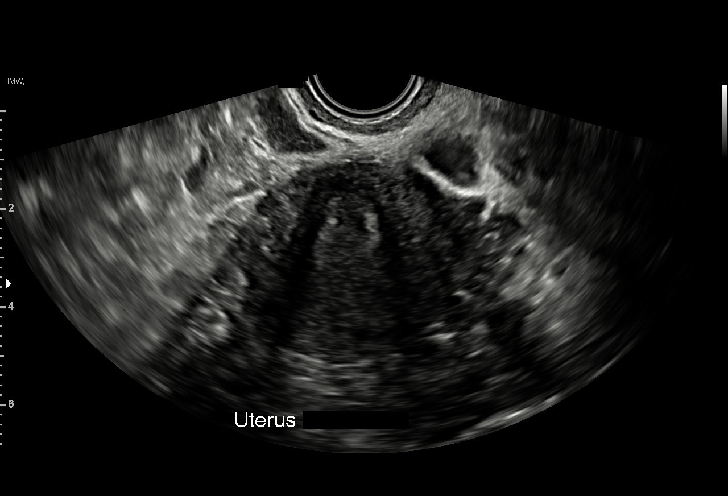
[im 26/42]
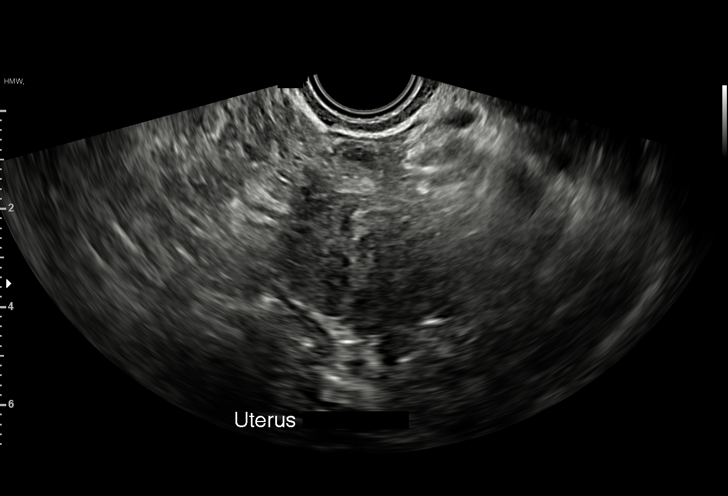
[im 29/42]
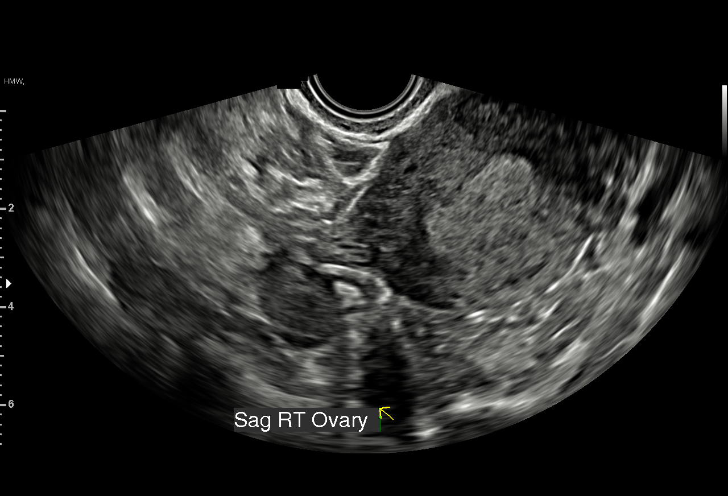
[im 32/42]
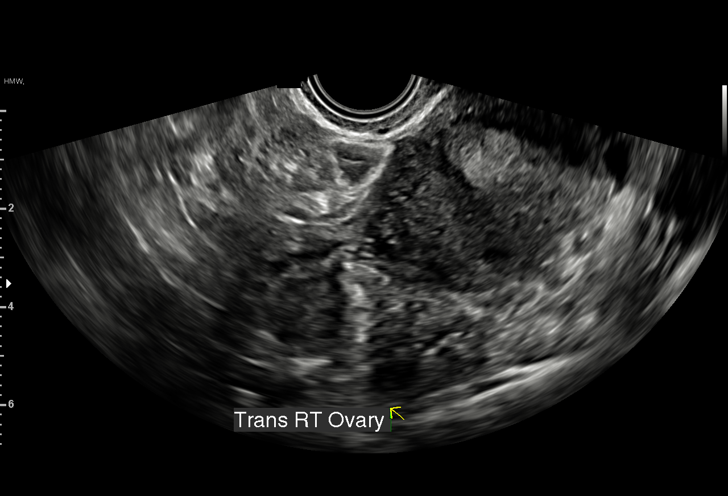
[im 35/42]
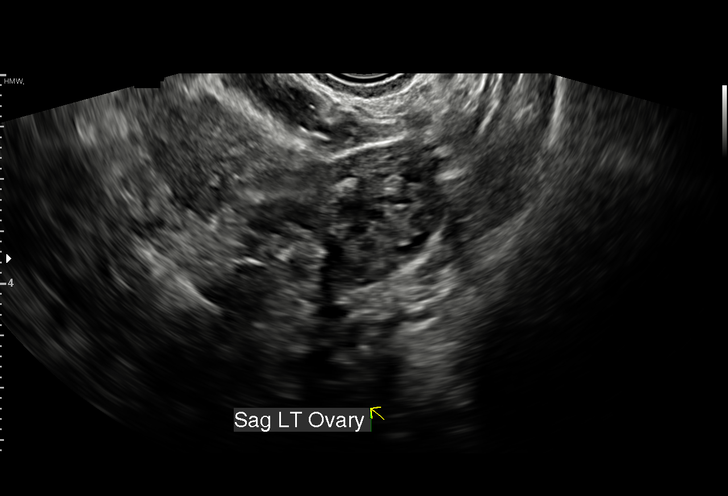
[im 38/42]
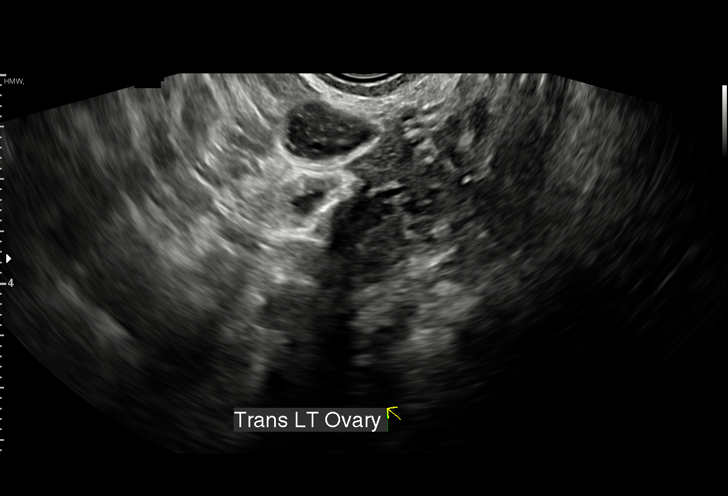
[im 42/42]
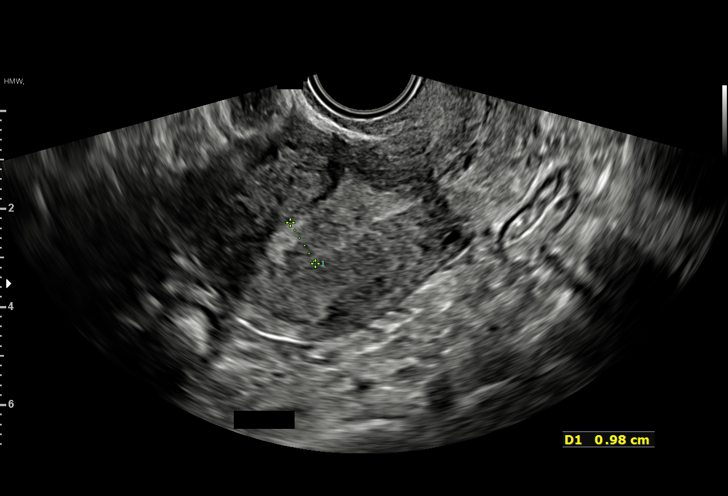

[15 of 28 positions shown; findings below may reference images not displayed]

FINDINGS: Intrauterine gestational sac: None

Yolk sac:  Not Visualized.

Embryo:  Not Visualized.

Cardiac Activity: Not Visualized.

Heart Rate:   bpm

MSD:   mm    w     d

CRL:    mm    w    d                  US EDC:

Subchorionic hemorrhage:  None visualized.

Maternal uterus/adnexae: No adnexal mass or free fluid.
IMPRESSION: No intrauterine pregnancy visualized. Differential considerations
would include early intrauterine pregnancy too early to visualize,
spontaneous abortion, or occult ectopic pregnancy. Recommend close
clinical followup and serial quantitative beta HCGs and ultrasounds.

## 2022-10-06 ENCOUNTER — Ambulatory Visit (INDEPENDENT_AMBULATORY_CARE_PROVIDER_SITE_OTHER): Payer: Self-pay | Admitting: Student

## 2022-10-06 ENCOUNTER — Other Ambulatory Visit (HOSPITAL_COMMUNITY): Payer: Self-pay

## 2022-10-06 VITALS — BP 171/111 | HR 77 | Temp 98.6°F | Ht 71.0 in | Wt 235.5 lb

## 2022-10-06 DIAGNOSIS — E2740 Unspecified adrenocortical insufficiency: Secondary | ICD-10-CM

## 2022-10-06 DIAGNOSIS — I10 Essential (primary) hypertension: Secondary | ICD-10-CM

## 2022-10-06 DIAGNOSIS — I1A0 Resistant hypertension: Secondary | ICD-10-CM

## 2022-10-06 DIAGNOSIS — Z79899 Other long term (current) drug therapy: Secondary | ICD-10-CM

## 2022-10-06 DIAGNOSIS — E038 Other specified hypothyroidism: Secondary | ICD-10-CM

## 2022-10-06 DIAGNOSIS — N3010 Interstitial cystitis (chronic) without hematuria: Secondary | ICD-10-CM

## 2022-10-06 DIAGNOSIS — Z139 Encounter for screening, unspecified: Secondary | ICD-10-CM

## 2022-10-06 DIAGNOSIS — F332 Major depressive disorder, recurrent severe without psychotic features: Secondary | ICD-10-CM

## 2022-10-06 DIAGNOSIS — E063 Autoimmune thyroiditis: Secondary | ICD-10-CM

## 2022-10-06 DIAGNOSIS — R1030 Lower abdominal pain, unspecified: Secondary | ICD-10-CM

## 2022-10-06 MED ORDER — KETOROLAC TROMETHAMINE 30 MG/ML IJ SOLN
30.0000 mg | Freq: Once | INTRAMUSCULAR | Status: AC
Start: 2022-10-06 — End: 2022-10-06
  Administered 2022-10-06: 30 mg via INTRAMUSCULAR

## 2022-10-06 MED ORDER — IRBESARTAN-HYDROCHLOROTHIAZIDE 150-12.5 MG PO TABS
2.0000 | ORAL_TABLET | Freq: Two times a day (BID) | ORAL | 11 refills | Status: DC
Start: 2022-10-06 — End: 2023-09-03
  Filled 2022-10-06 – 2022-10-07 (×2): qty 120, 30d supply, fill #0
  Filled 2022-11-06: qty 120, 30d supply, fill #1
  Filled 2023-01-26: qty 120, 30d supply, fill #2
  Filled 2023-03-30: qty 120, 30d supply, fill #3
  Filled 2023-06-08: qty 120, 30d supply, fill #4

## 2022-10-06 MED ORDER — KETOROLAC TROMETHAMINE 30 MG/ML IJ SOLN
30.0000 mg | Freq: Once | INTRAMUSCULAR | 0 refills | Status: AC
Start: 2022-10-06 — End: 2022-10-14
  Filled 2022-10-06 – 2022-10-07 (×2): qty 1, 1d supply, fill #0

## 2022-10-06 MED ORDER — LEVOTHYROXINE SODIUM 150 MCG PO TABS
150.0000 ug | ORAL_TABLET | Freq: Every day | ORAL | 11 refills | Status: AC
  Filled 2022-10-06: qty 30, 30d supply, fill #0
  Filled 2022-11-06: qty 30, 30d supply, fill #1
  Filled 2023-03-30: qty 30, 30d supply, fill #2
  Filled 2023-06-08: qty 30, 30d supply, fill #3

## 2022-10-06 MED ORDER — AMLODIPINE BESYLATE 10 MG PO TABS
10.0000 mg | ORAL_TABLET | Freq: Every day | ORAL | 11 refills | Status: DC
Start: 2022-10-06 — End: 2023-03-25
  Filled 2022-10-06: qty 30, 30d supply, fill #0

## 2022-10-06 NOTE — Progress Notes (Unsigned)
Subjective:  CC: Bladder pain  HPI:  [name] is a 32 y.o. female with a past medical history stated below and presents today for chronic condition follow up. Of note, this patient is uninsured with difficulty managing her high risk medical conditions and presents today for acute on chronic bladder pain. However, during triage, patient's blood pressure was elevated . We pivoted our conversation about medication nonadherence secondary to her lack of resources and difficulty with medical follow up. Today's visit was focused on consolidating/re initiation of medical therapy for hypertension, adrenal insufficiency, and hypothyroidism. Please see problem based assessment and plan for additional details.  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  History of HELLP syndrome at 26 weeks 5 days (12/07/2017); admitted to [facility] with BP 200/120s, Plt 52, AST/ALT 471/375  APS evaluation negative (04/2019)   Daily ASA 81 mg   Challenging blood pressure control in current pregnancy      Plan:  - Baseline pre-eclampsia labs this pregnancy within normal limits (Cr: 0.71; urine p:c 0.106)  -    IC (interstitial cystitis)    Kidney stones    Migraines    Necrotizing fasciitis (HCC)    left hand amputation   PTSD (post-traumatic stress disorder)    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   Severe uncontrolled hypertension 10/08/2017   UTI (urinary tract infection)     Current Outpatient Medications on File Prior to Visit  Medication Sig Dispense Refill   acetaminophen  (TYLENOL) 500 MG tablet Take 1,000 mg by mouth daily as needed for moderate pain, headache or fever.     albuterol (VENTOLIN HFA) 108 (90 Base) MCG/ACT inhaler INHALE 2 PUFFS INTO THE LUNGS EVERY 4 HOURS AS NEEDED FOR WHEEZING OR SHORTNESS OR BREATH (Patient taking differently: Inhale 2 puffs into the lungs every 6 (six) hours as needed for wheezing or shortness of breath.) 18 g 0   clonazePAM (KLONOPIN) 1 MG tablet Take 1 tablet (1 mg total) by mouth 3 (three) times daily as needed for up to 11 days for anxiety. 33 tablet 0   EPINEPHrine 0.3 mg/0.3 mL IJ SOAJ injection Inject 0.3 mg into the muscle as needed for anaphylaxis. 1 each 2   famotidine (PEPCID) 20 MG tablet Take 1 tablet (20 mg total) by mouth 2 (two) times daily. 60 tablet 5   FLUoxetine (PROZAC) 40 MG capsule Take 1 capsule (40 mg total) by mouth daily. IM program 30 capsule 11   hydrocortisone (CORTEF) 10 MG tablet Take 1 tablet (10 mg total) by mouth 2 (two) times daily. IM program 60 tablet 11   lidocaine (LIDODERM) 5 % Place 1 patch onto the skin daily. Remove & Discard patch within 12 hours or as directed by MD 30 patch 0   Multiple Vitamin (MULTIVITAMIN) tablet Take 1 tablet by mouth daily.     pantoprazole (PROTONIX) 40 MG tablet Take 1 tablet (40 mg total) by mouth 2 (two) times daily before a meal. 60 tablet 0   SYMBICORT 80-4.5 MCG/ACT inhaler Inhale 2 puffs into the lungs 2 (two) times  daily as needed (shortness of breath).     No current facility-administered medications on file prior to visit.    Family History  Problem Relation Age of Onset   Hypertension Mother    Coronary artery disease Mother    Hypertension Father    Stroke Father    Atrial fibrillation Father    Heart failure Maternal Grandfather    Heart attack Maternal Grandfather     Social History   Socioeconomic History   Marital status: Married    Spouse name: Not on file   Number of children: 3   Years of education: Not on file   Highest  education level: 8th grade  Occupational History   Not on file  Tobacco Use   Smoking status: Never   Smokeless tobacco: Never   Tobacco comments:    She tried a cigarette and never smoked again  Vaping Use   Vaping status: Never Used  Substance and Sexual Activity   Alcohol use: Yes    Comment: bottle of wine a week-occ   Drug use: No   Sexual activity: Not Currently    Birth control/protection: Surgical  Other Topics Concern   Not on file  Social History Narrative   Lives with husband and three kids   Social Determinants of Health   Financial Resource Strain: Not on file  Food Insecurity: No Food Insecurity (04/24/2022)   Hunger Vital Sign    Worried About Running Out of Food in the Last Year: Never true    Ran Out of Food in the Last Year: Never true  Recent Concern: Food Insecurity - Food Insecurity Present (03/12/2022)   Hunger Vital Sign    Worried About Running Out of Food in the Last Year: Often true    Ran Out of Food in the Last Year: Often true  Transportation Needs: No Transportation Needs (04/24/2022)   PRAPARE - Administrator, Civil Service (Medical): No    Lack of Transportation (Non-Medical): No  Recent Concern: Transportation Needs - Unmet Transportation Needs (03/12/2022)   PRAPARE - Transportation    Lack of Transportation (Medical): Yes    Lack of Transportation (Non-Medical): Yes  Physical Activity: Inactive (04/09/2022)   Exercise Vital Sign    Days of Exercise per Week: 0 days    Minutes of Exercise per Session: 0 min  Stress: Not on file  Social Connections: Not on file  Intimate Partner Violence: Not At Risk (04/24/2022)   Humiliation, Afraid, Rape, and Kick questionnaire    Fear of Current or Ex-Partner: No    Emotionally Abused: No    Physically Abused: No    Sexually Abused: No    Review of Systems: ROS negative except for what is noted on the assessment and plan.  Objective:   Vitals:   10/06/22 1316 10/06/22 1333  BP:  (!) 193/117 (!) 171/111  Pulse: 83 77  Temp: 98.6 F (37 C)   TempSrc: Oral   SpO2: 100%   Weight: 235 lb 8 oz (106.8 kg)   Height: 5\' 11"  (1.803 m)     Physical Exam: Constitutional: well-appearing young woman sitting in chair, in  mild distress HENT: normocephalic atraumatic, mucous membranes moist Eyes: conjunctiva non-erythematous Neck: supple Cardiovascular: regular rate and rhythm, no m/r/g Pulmonary/Chest: normal work of breathing on room air, lungs clear to auscultation bilaterally Abdominal: soft, non-tender, non-distended. No suprapubic tenderness. 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 Sad affect       10/06/2022    1:37 PM  Depression screen PHQ 2/9  Decreased Interest 3  Down, Depressed, Hopeless 2  PHQ - 2 Score 5  Altered sleeping 3  Tired, decreased energy 3  Change in appetite 2  Feeling bad or failure about yourself  2  Trouble concentrating 3  Moving slowly or fidgety/restless 2  Suicidal thoughts 0  PHQ-9 Score 20       10/06/2022    1:39 PM 06/19/2022    4:20 PM 06/13/2021   11:48 AM 05/23/2021   10:42 AM  GAD 7 : Generalized Anxiety Score  Nervous, Anxious, on Edge 3 2 3 3   Control/stop worrying 2 2 1 1   Worry too much - different things 2 3 1 1   Trouble relaxing 3 2 0 2  Restless 1 1 1 1   Easily annoyed or irritable 3 3 3 3   Afraid - awful might happen 2 3 2 2   Total GAD 7 Score 16 16 11 13   Anxiety Difficulty Extremely difficult        Assessment & Plan:   Lower abdominal pain Chronic issue. Thought to be in setting of chronic interstitial cystitis. Please see prior admissions to hospital for extended work up. Today patient presents with similar intermittent sharp pains associated with urinary retention for which patient intermittently self caths. She has cath supplies at home. Denies dysuria,  polyuria, hematuria, or lower back pain.  Has been managing with tylenol and ibuprofen without complete relief. Patient is uncomfortable today without red flag symptoms on physical exam, however she looks uncomofortable and in distress.  Discussed previous referral and scheduled appointment with Dr. Logan Bores in [facility], which she plans to keep for advanced therapies for suspected chronic instertitial cystitis. She would also benefit from pelvic PT, however, lack of insurance if limiting. Given that there is a plan in place to work this further, will opt for symptomatic relief in office with ketorolac and provide patient with follow up appointment after visit with Dr. Logan Bores for long term management - Referral to urology -Ketorolac 30 mg in office -Precautionary symptoms for return/seeking medical management reviewed  Resistant hypertension Elevated today. Patient reports that she has only been taking amlodipine 10 mg and Irbesartan 300, however, never picked up Chlorthalidone Has not taken Hydralazine in over 600 mg. She was previously seen by Dr. Duke Salvia in Cardiology but has not followed up 2/2 lack of insurance.   Denies chest pain, shortness of breath, presyncope or syncopal episodes. Has a history of headaches with hypertension, none today. Prior eye problems, none acute.  Vitals:   10/06/22 1316 10/06/22 1333  BP: (!) 193/117 (!) 171/111   Consodilated medical therapy today to aid with cost and adherence tthough IM program at [facility] outpatient pharmacy. Will obtain BMP today and RTC in one month for follow up -Irbesartan-hydrochlorothiazide 300-25mg  daily -Amlodipine 10 mg -RTC in 1 month -BMP today  Adrenal insufficiency (HCC) History of secondary AI - intermittently using Cortef 10 mg BID.No weakness, n/v, lightheadedness. This patient has a chronic history of fatigue and and myalgias with many uncontrolled medical conditions. Her nonadherence to therapy likely contributing. Discussed  this high risk condition and importance of prioritizing this medcation. Reminded patient to let office know when in acute illness for instruction on how to increase therapy if needed. -Refilled Cortef today  Hypothyroidism due to Hashimoto's  thyroiditis Previous TSH of 14. Patient intermittently adherent to levthyroxine . Will obtain TSH today and refill levothyroxine at today. Will need recheck in 1 month for suspected need for uptitration  MDD (major depressive disorder), recurrent episode, severe (HCC) High PHQ9 and GAD7 today without SI/HI. Recently restarted prozac 40 mg. Will need close follow up for this issue. Could not explore further today but is likely contributing to her overall presentation. Please address at follow up. -Continue Prozac 40 mg and monitor serial PHQ9 and GAD7 -When able, refer for CBT  Encounter for screening involving social determinants of health (SDoH) Provided Medicaid, [employer], and [employer] assistance program paperwork. Stressed need for this given high risk medical conditions and need for specialist care. -Follow up at next OV   Return in about 4 weeks (around 11/03/2022) for Blood pressure, BMP, hypothyroidism, and depression.  Patient discussed with Dr. Burna Forts, MD [facility] Internal Medicine Program - PGY-2

## 2022-10-06 NOTE — Patient Instructions (Addendum)
Thank you, [name] for allowing [place] to provide your care today. Today we discussed   HTN Irbersartan-hydrochlorothiazide combination 150-12.5 mg (2 tablets once daily) Amlodipine 10 mg when you ran of it  Monitor your blood pressures at home and bring your readings at the next outpatient visit  Monitor your blood pressure Pick up your Cortef - you should have plenty of refills at the [facility] pharmacy  Synthroid; we will restart you at the 150 mcg dose. When you come back in one month, we   Referrals: -Please make the efforts to follow up with Dr. Logan Bores, Urologist - when you go to his office, also inquire about medication or medical assistance programs with his practice -Fill out all the paperwork for medicine/medical assistance programs  New medications: -  I have ordered the following labs for you:  Lab Orders         BMP8         TSH       I will call if any are abnormal. All of your labs can be accessed through "My Chart".   My Chart Access: [email or web address]  Please follow-up in: 1 month    We look forward to seeing you next time. Please call our clinic at [phone] if you have any questions or concerns. The best time to call is Monday-Friday from 9am-4pm, but there is someone available 24/7. If after hours or the weekend, call the main hospital number and ask for the Internal Medicine Resident On-Call. If you need medication refills, please notify your pharmacy one week in advance and they will send [place] a request.   Thank you for letting us take part in your care. Wishing you the best!  Morene Crocker, MD 10/06/2022, 2:29 PM [facility] Internal Medicine Resident, PGY-2   [facility] - preferred! URGENT CARE SERVICES - WALK IN Phone: [phone]  Address: [address] [place], [place] [address]  Hours:Open 24/7, No appointment required.  URGENT CARE SERVICES Assessment:   mental health evaluation, assessing immediate safety concerns and further mental health needs.  Referral: Resources, Connect to community-based mental health treatment, when indicated, including psychotherapy, psychiatry and other specialized behavioral health or substance use disorder services (for those not already in treatment).  Transitional care: In-person assessment and/or virtual follow up during the patient's transition to connect them with the appropriate outpatient services.  OUTPATIENT SERVICES  Individual Therapy Partial Hospitalization Program (PHP) Substance Abuse Intensive Outpatient Program [facility]) Specialized Intensive Adult Group Therapy Medication Management Peer Living Room

## 2022-10-07 ENCOUNTER — Other Ambulatory Visit (HOSPITAL_COMMUNITY): Payer: Self-pay

## 2022-10-07 ENCOUNTER — Other Ambulatory Visit: Payer: Self-pay

## 2022-10-07 ENCOUNTER — Encounter: Payer: Self-pay | Admitting: Student

## 2022-10-07 DIAGNOSIS — Z139 Encounter for screening, unspecified: Secondary | ICD-10-CM | POA: Insufficient documentation

## 2022-10-07 NOTE — Assessment & Plan Note (Signed)
History of secondary AI - intermittently using Cortef 10 mg BID.No weakness, n/v, lightheadedness. This patient has a chronic history of fatigue and and myalgias with many uncontrolled medical conditions. Her nonadherence to therapy likely contributing. Discussed this high risk condition and importance of prioritizing this medcation. Reminded patient to let office know when in acute illness for instruction on how to increase therapy if needed. -Refilled Cortef today

## 2022-10-07 NOTE — Assessment & Plan Note (Signed)
Elevated today. Patient reports that she has only been taking amlodipine 10 mg and Irbesartan 300, however, never picked up Chlorthalidone Has not taken Hydralazine in over 600 mg. She was previously seen by Dr. Duke Salvia in Cardiology but has not followed up 2/2 lack of insurance.   Denies chest pain, shortness of breath, presyncope or syncopal episodes. Has a history of headaches with hypertension, none today. Prior eye problems, none acute.  Vitals:   10/06/22 1316 10/06/22 1333  BP: (!) 193/117 (!) 171/111   Consodilated medical therapy today to aid with cost and adherence tthough IM program at [facility] outpatient pharmacy. Will obtain BMP today and RTC in one month for follow up -Irbesartan-hydrochlorothiazide 300-25mg  daily -Amlodipine 10 mg -RTC in 1 month -BMP today

## 2022-10-07 NOTE — Assessment & Plan Note (Addendum)
Chronic issue. Thought to be in setting of chronic interstitial cystitis. Please see prior admissions to hospital for extended work up. Today patient presents with similar intermittent sharp pains associated with urinary retention for which patient intermittently self caths. She has cath supplies at home. Denies dysuria, polyuria, hematuria, or lower back pain.  Has been managing with tylenol and ibuprofen without complete relief. Patient is uncomfortable today without red flag symptoms on physical exam, however she looks uncomofortable and in distress.  Discussed previous referral and scheduled appointment with Dr. Logan Bores in [facility], which she plans to keep for advanced therapies for suspected chronic instertitial cystitis. She would also benefit from pelvic PT, however, lack of insurance if limiting. Given that there is a plan in place to work this further, will opt for symptomatic relief in office with ketorolac and provide patient with follow up appointment after visit with Dr. Logan Bores for long term management - Referral to urology -Ketorolac 30 mg in office -Precautionary symptoms for return/seeking medical management reviewed

## 2022-10-07 NOTE — Assessment & Plan Note (Signed)
High PHQ9 and GAD7 today without SI/HI. Recently restarted prozac 40 mg. Will need close follow up for this issue. Could not explore further today but is likely contributing to her overall presentation. Please address at follow up. -Continue Prozac 40 mg and monitor serial PHQ9 and GAD7 -When able, refer for CBT

## 2022-10-07 NOTE — Assessment & Plan Note (Addendum)
Provided Medicaid, [employer], and [employer] assistance program paperwork. Stressed need for this given high risk medical conditions and need for specialist care. -Follow up at next OV

## 2022-10-07 NOTE — Assessment & Plan Note (Signed)
Previous TSH of 14. Patient intermittently adherent to levthyroxine . Will obtain TSH today and refill levothyroxine at today. Will need recheck in 1 month for suspected need for uptitration

## 2022-10-08 ENCOUNTER — Other Ambulatory Visit: Payer: Self-pay

## 2022-10-09 NOTE — Progress Notes (Signed)
Discussed with patient. Her TSH is much improved since last follow up 3 months ago. She had been taking of synthroid intermittently. Will continue at this current dose. RFP within normal levels

## 2022-10-09 NOTE — Progress Notes (Signed)
Internal Medicine Clinic Attending  Case discussed with the resident at the time of the visit.  We reviewed the resident's history and exam and pertinent patient test results.  I agree with the assessment, diagnosis, and plan of care documented in the resident's note.    We will see if we can add free T4 to her labs. If not, will repeat TSH & free T4 at upcoming visit

## 2022-10-15 ENCOUNTER — Other Ambulatory Visit: Payer: Self-pay | Admitting: Allergy

## 2022-10-15 ENCOUNTER — Other Ambulatory Visit (HOSPITAL_COMMUNITY): Payer: Self-pay

## 2022-10-15 MED ORDER — ALBUTEROL SULFATE HFA 108 (90 BASE) MCG/ACT IN AERS
2.0000 | INHALATION_SPRAY | RESPIRATORY_TRACT | 1 refills | Status: DC | PRN
Start: 2022-10-15 — End: 2024-01-05
  Filled 2022-10-15: qty 6.7, 25d supply, fill #0
  Filled 2022-11-06: qty 6.7, 20d supply, fill #0
  Filled 2023-01-26: qty 6.7, 17d supply, fill #0
  Filled 2023-06-08: qty 6.7, 17d supply, fill #1

## 2022-10-28 ENCOUNTER — Other Ambulatory Visit (HOSPITAL_COMMUNITY): Payer: Self-pay

## 2022-11-03 ENCOUNTER — Encounter: Payer: Self-pay | Admitting: Student

## 2022-11-06 ENCOUNTER — Other Ambulatory Visit (HOSPITAL_COMMUNITY): Payer: Self-pay

## 2022-11-07 ENCOUNTER — Other Ambulatory Visit (HOSPITAL_COMMUNITY): Payer: Self-pay

## 2022-11-12 ENCOUNTER — Other Ambulatory Visit (HOSPITAL_COMMUNITY): Payer: Self-pay

## 2022-11-20 ENCOUNTER — Other Ambulatory Visit (HOSPITAL_COMMUNITY): Payer: Self-pay

## 2022-12-29 ENCOUNTER — Encounter (HOSPITAL_COMMUNITY): Payer: Self-pay | Admitting: Emergency Medicine

## 2022-12-29 ENCOUNTER — Other Ambulatory Visit: Payer: Self-pay

## 2022-12-29 ENCOUNTER — Emergency Department (HOSPITAL_COMMUNITY)
Admission: EM | Admit: 2022-12-29 | Discharge: 2022-12-29 | Disposition: A | Discharge status: Home / Self Care | Payer: Self-pay | Attending: Emergency Medicine | Admitting: Emergency Medicine

## 2022-12-29 ENCOUNTER — Emergency Department (HOSPITAL_COMMUNITY): Payer: Self-pay

## 2022-12-29 DIAGNOSIS — M549 Dorsalgia, unspecified: Secondary | ICD-10-CM | POA: Insufficient documentation

## 2022-12-29 DIAGNOSIS — R3989 Other symptoms and signs involving the genitourinary system: Secondary | ICD-10-CM | POA: Insufficient documentation

## 2022-12-29 DIAGNOSIS — R112 Nausea with vomiting, unspecified: Secondary | ICD-10-CM | POA: Insufficient documentation

## 2022-12-29 DIAGNOSIS — R1030 Lower abdominal pain, unspecified: Secondary | ICD-10-CM | POA: Insufficient documentation

## 2022-12-29 DIAGNOSIS — R110 Nausea: Secondary | ICD-10-CM

## 2022-12-29 LAB — COMPREHENSIVE METABOLIC PANEL
ALT: 23 U/L (ref 0–44)
AST: 26 U/L (ref 15–41)
Albumin: 5.4 g/dL — ABNORMAL HIGH (ref 3.5–5.0)
Alkaline Phosphatase: 60 U/L (ref 38–126)
Anion gap: 12 (ref 5–15)
BUN: 13 mg/dL (ref 6–20)
CO2: 24 mmol/L (ref 22–32)
Calcium: 9.9 mg/dL (ref 8.9–10.3)
Chloride: 102 mmol/L (ref 98–111)
Creatinine, Ser: 0.78 mg/dL (ref 0.44–1.00)
GFR, Estimated: >60 mL/min (ref >60)
Glucose, Bld: 94 mg/dL (ref 70–99)
Potassium: 3.9 mmol/L (ref 3.5–5.1)
Sodium: 138 mmol/L (ref 135–145)
Total Bilirubin: 1 mg/dL (ref <1.2)
Total Protein: 9.2 g/dL — ABNORMAL HIGH (ref 6.5–8.1)

## 2022-12-29 LAB — URINALYSIS, ROUTINE W REFLEX MICROSCOPIC
Bacteria, UA: NONE SEEN
Bilirubin Urine: NEGATIVE
Glucose, UA: NEGATIVE mg/dL
Ketones, ur: NEGATIVE mg/dL
Leukocytes,Ua: NEGATIVE
Nitrite: NEGATIVE
Protein, ur: 30 mg/dL — AB
Specific Gravity, Urine: 1.009 (ref 1.005–1.030)
pH: 7 (ref 5.0–8.0)

## 2022-12-29 LAB — CBC
HCT: 50.8 % — ABNORMAL HIGH (ref 36.0–46.0)
Hemoglobin: 17 g/dL — ABNORMAL HIGH (ref 12.0–15.0)
MCH: 30.9 pg (ref 26.0–34.0)
MCHC: 33.5 g/dL (ref 30.0–36.0)
MCV: 92.2 fL (ref 80.0–100.0)
Platelets: 176 10*3/uL (ref 150–400)
RBC: 5.51 MIL/uL — ABNORMAL HIGH (ref 3.87–5.11)
RDW: 14.1 % (ref 11.5–15.5)
WBC: 9.7 10*3/uL (ref 4.0–10.5)
nRBC: 0 % (ref 0.0–0.2)

## 2022-12-29 LAB — HCG, SERUM, QUALITATIVE: Preg, Serum: NEGATIVE

## 2022-12-29 LAB — LIPASE, BLOOD: Lipase: 31 U/L (ref 11–51)

## 2022-12-29 MED ORDER — MORPHINE SULFATE (PF) 4 MG/ML IV SOLN
4.0000 mg | Freq: Once | INTRAVENOUS | Status: AC
  Administered 2022-12-29: 4 mg via INTRAVENOUS
  Filled 2022-12-29: qty 1

## 2022-12-29 MED ORDER — IOHEXOL 300 MG/ML  SOLN
100.0000 mL | Freq: Once | INTRAMUSCULAR | Status: AC | PRN
  Administered 2022-12-29: 100 mL via INTRAVENOUS

## 2022-12-29 MED ORDER — SODIUM CHLORIDE 0.9 % IV BOLUS
1000.0000 mL | Freq: Once | INTRAVENOUS | Status: AC
  Administered 2022-12-29: 1000 mL via INTRAVENOUS

## 2022-12-29 MED ORDER — ONDANSETRON HCL 4 MG/2ML IJ SOLN
4.0000 mg | Freq: Once | INTRAMUSCULAR | Status: AC
Start: 2022-12-29 — End: 2022-12-29
  Administered 2022-12-29: 4 mg via INTRAVENOUS
  Filled 2022-12-29: qty 2

## 2022-12-29 MED ORDER — OXYCODONE-ACETAMINOPHEN 5-325 MG PO TABS: 1.0000 | ORAL_TABLET | Freq: Three times a day (TID) | ORAL | 0 refills | Status: AC | PRN

## 2022-12-29 MED ORDER — ONDANSETRON 4 MG PO TBDP: 4.0000 mg | ORAL_TABLET | Freq: Three times a day (TID) | ORAL | 0 refills | Status: DC | PRN

## 2022-12-29 NOTE — ED Triage Notes (Signed)
Patient arrives ambulatory by POV c/o bladder pain and lower back pain x 1 week. Reports unable to keep any medicine down onset of today.

## 2022-12-29 NOTE — ED Notes (Signed)
Pt is in pain informed the nurse, Nurse is giving pt medication then the bladder scan will be done shortly after medication has be giving

## 2022-12-29 NOTE — ED Provider Notes (Addendum)
Care of patient assumed from Dr. Rodena Medin.  This patient presents with acute on chronic lower abdominal pain.  Urinalysis is clean.  She is awaiting CT scan results.  She has had improved symptoms.  Discharge is anticipated. Physical Exam  BP (!) 175/112   Pulse 68   Temp 98.4 F (36.9 C) (Oral)   Resp 14   Ht 5\' 11"  (1.803 m)   Wt 106.6 kg   LMP 12/17/2022   SpO2 98%   BMI 32.78 kg/m   Physical Exam Vitals and nursing note reviewed.  Constitutional:      General: She is not in acute distress.    Appearance: Normal appearance. She is well-developed. She is not ill-appearing, toxic-appearing or diaphoretic.  HENT:     Head: Normocephalic and atraumatic.     Right Ear: External ear normal.     Left Ear: External ear normal.     Nose: Nose normal.     Mouth/Throat:     Mouth: Mucous membranes are moist.  Eyes:     Extraocular Movements: Extraocular movements intact.     Conjunctiva/sclera: Conjunctivae normal.  Cardiovascular:     Rate and Rhythm: Normal rate and regular rhythm.  Pulmonary:     Effort: Pulmonary effort is normal. No respiratory distress.  Abdominal:     General: There is no distension.     Palpations: Abdomen is soft.  Musculoskeletal:        General: No swelling. Normal range of motion.     Cervical back: Normal range of motion and neck supple.  Skin:    General: Skin is warm and dry.     Capillary Refill: Capillary refill takes less than 2 seconds.     Coloration: Skin is not jaundiced or pale.  Neurological:     General: No focal deficit present.     Mental Status: She is alert and oriented to person, place, and time.  Psychiatric:        Mood and Affect: Mood normal.        Behavior: Behavior normal.     Procedures  Procedures  ED Course / MDM    Medical Decision Making Amount and/or Complexity of Data Reviewed Labs: ordered. Radiology: ordered.  Risk Prescription drug management.   On assessment, patient resting on ED stretcher.  She  endorses continued improved pain and nausea.  She has been able to drink while in the ED.  CT scan did not show any acute findings.  There was a small volume of trace fluid.  This may be secondary to ovarian cystic rupture which may explain her recent worsened pain.  She was advised to continue multimodal pain control at home.  ODT Zofran was prescribed.  She was discharged in good condition.       Gloris Manchester, MD 12/29/22 1643    Gloris Manchester, MD 12/29/22 [phone]

## 2022-12-29 NOTE — Discharge Instructions (Signed)
Test results today are reassuring.  Continue multimodal pain control at home with ibuprofen and Tylenol.  A prescription for a narcotic pain medication was sent to your pharmacy.  Take only as needed.  Dissolving Zofran was sent as well.  Take this as needed for nausea.  Stay hydrated.  Return to the emergency department for any new or worsening symptoms of concern.

## 2022-12-29 NOTE — ED Provider Notes (Signed)
[facility] Provider Note   CSN: [id number] Arrival date & time: 12/29/22  1127     History {Add pertinent medical, surgical, social history, OB history to HPI:1} Chief Complaint  Patient presents with   Bladder Pain    Back Pain    Krystal [name] is a 32 y.o. female.  32 year old female with prior medical history as detailed below presents for evaluation.  Patient complains of worsening acute on chronic suprapubic pain.  Patient reports that her pain is wrapping around her right flank.  Patient reports associated nausea, vomiting.  She denies fever.  She reports that she has been unable to keep down her medications today.  The history is provided by the patient and medical records.       Home Medications Prior to Admission medications   Medication Sig Start Date End Date Taking? Authorizing Provider  acetaminophen (TYLENOL) 500 MG tablet Take 1,000 mg by mouth daily as needed for moderate pain, headache or fever.    [provider]  albuterol (VENTOLIN HFA) 108 (90 Base) MCG/ACT inhaler Inhale 2 puffs into the lungs every 4 (four) hours as needed for wheezing or shortness of breath. 10/15/22   Ellamae Sia, DO  amLODipine (NORVASC) 10 MG tablet Take 1 tablet (10 mg total) by mouth daily. 10/06/22   Morene Crocker, MD  clonazePAM (KLONOPIN) 1 MG tablet Take 1 tablet (1 mg total) by mouth 3 (three) times daily as needed for up to 11 days for anxiety. 06/19/22 07/01/22  Quincy Simmonds, MD  EPINEPHrine 0.3 mg/0.3 mL IJ SOAJ injection Inject 0.3 mg into the muscle as needed for anaphylaxis. 01/15/21   Ellamae Sia, DO  famotidine (PEPCID) 20 MG tablet Take 1 tablet (20 mg total) by mouth 2 (two) times daily. 12/04/21   Ellamae Sia, DO  FLUoxetine (PROZAC) 40 MG capsule Take 1 capsule (40 mg total) by mouth daily. IM program 06/19/22   Quincy Simmonds, MD  hydrocortisone (CORTEF) 10 MG tablet Take 1 tablet (10 mg total) by mouth  2 (two) times daily. IM program 06/19/22   Quincy Simmonds, MD  irbesartan-hydrochlorothiazide (AVALIDE) 150-12.5 MG tablet Take 2 tablets by mouth in the morning and at bedtime. 10/06/22 10/06/23  Morene Crocker, MD  levothyroxine (SYNTHROID) 150 MCG tablet Take 1 tablet (150 mcg total) by mouth daily at 6 (six) AM. 10/06/22 10/06/23  Morene Crocker, MD  lidocaine (LIDODERM) 5 % Place 1 patch onto the skin daily. Remove & Discard patch within 12 hours or as directed by MD 05/05/22   Doran Stabler, DO  Multiple Vitamin (MULTIVITAMIN) tablet Take 1 tablet by mouth daily.    [provider]  pantoprazole (PROTONIX) 40 MG tablet Take 1 tablet (40 mg total) by mouth 2 (two) times daily before a meal. 05/05/22   Doran Stabler, DO  SYMBICORT 80-4.5 MCG/ACT inhaler Inhale 2 puffs into the lungs 2 (two) times daily as needed (shortness of breath).    [provider]      Allergies    Bee venom, Coreg [carvedilol], Red dye #40 (allura red), Compazine [prochlorperazine maleate], Oxycodone, Vicodin [hydrocodone-acetaminophen], Cymbalta [duloxetine hcl], Ditropan [oxybutynin], Elavil [amitriptyline], Motrin [ibuprofen], Naprosyn [naproxen], Pyrimethamine, Sulfadoxine, Zestril [lisinopril], Nifedical xl [nifedipine], Reglan [metoclopramide], and Trandate [labetalol]    Review of Systems   Review of Systems  All other systems reviewed and are negative.   Physical Exam Updated Vital Signs BP (!) 200/125   Pulse 73   Temp  98.8 F (37.1 C) (Oral)   Resp 17   Ht 5\' 11"  (1.803 m)   Wt 106.6 kg   LMP 12/17/2022   SpO2 100%   BMI 32.78 kg/m  Physical Exam Vitals and nursing note reviewed.  Constitutional:      General: She is not in acute distress.    Appearance: Normal appearance. She is well-developed.  HENT:     Head: Normocephalic and atraumatic.  Eyes:     Conjunctiva/sclera: Conjunctivae normal.     Pupils: Pupils are equal, round, and reactive to light.   Cardiovascular:     Rate and Rhythm: Normal rate and regular rhythm.     Heart sounds: Normal heart sounds.  Pulmonary:     Effort: Pulmonary effort is normal. No respiratory distress.     Breath sounds: Normal breath sounds.  Abdominal:     General: There is no distension.     Palpations: Abdomen is soft.     Tenderness: There is no abdominal tenderness.     Comments: Maximal tenderness to palpation overlying the suprapubic area.  Musculoskeletal:        General: No deformity. Normal range of motion.     Cervical back: Normal range of motion and neck supple.  Skin:    General: Skin is warm and dry.  Neurological:     General: No focal deficit present.     Mental Status: She is alert and oriented to person, place, and time.     ED Results / Procedures / Treatments   Labs (all labs ordered are listed, but only abnormal results are displayed) Labs Reviewed  COMPREHENSIVE METABOLIC PANEL - Abnormal; Notable for the following components:      Result Value   Total Protein 9.2 (*)    Albumin 5.4 (*)    All other components within normal limits  CBC - Abnormal; Notable for the following components:   RBC 5.51 (*)    Hemoglobin 17.0 (*)    HCT 50.8 (*)    All other components within normal limits  LIPASE, BLOOD  HCG, SERUM, QUALITATIVE  URINALYSIS, ROUTINE W REFLEX MICROSCOPIC    EKG EKG Interpretation Date/Time:  Monday December 29 2022 12:27:54 EST Ventricular Rate:  75 PR Interval:  137 QRS Duration:  117 QT Interval:  411 QTC Calculation: 460 R Axis:   19  Text Interpretation: Sinus rhythm Probable left ventricular hypertrophy Confirmed by Kristine Royal [phone]) on 12/29/2022 12:43:54 PM  Radiology No results found.  Procedures Procedures  {Document cardiac monitor, telemetry assessment procedure when appropriate:1}  Medications Ordered in ED Medications  sodium chloride 0.9 % bolus 1,000 mL (1,000 mLs Intravenous New Bag/Given 12/29/22 1219)  morphine (PF)  4 MG/ML injection 4 mg (4 mg Intravenous Given 12/29/22 1219)  ondansetron (ZOFRAN) injection 4 mg (4 mg Intravenous Given 12/29/22 1219)  iohexol (OMNIPAQUE) 300 MG/ML solution 100 mL (100 mLs Intravenous Contrast Given 12/29/22 1312)    ED Course/ Medical Decision Making/ A&P   {   Click here for ABCD2, HEART and other calculatorsREFRESH Note before signing :1}                              Medical Decision Making Amount and/or Complexity of Data Reviewed Labs: ordered. Radiology: ordered.  Risk Prescription drug management.    Medical Screen Complete  This patient presented to the ED with complaint of ***.  This complaint involves an extensive number of  treatment options. The initial differential diagnosis includes, but is not limited to, ***  This presentation is: {IllnessRisk:19196::"***","Acute","Chronic","Self-Limited","Previously Undiagnosed","Uncertain Prognosis","Complicated","Systemic Symptoms","Threat to Life/Bodily Function"}    Co morbidities that complicated the patient's evaluation  ***   Additional history obtained:  Additional history obtained from {History source:19196::"EMS","Spouse","Family","Friend","Caregiver"} External records from outside sources obtained and reviewed including prior ED visits and prior Inpatient records.    Lab Tests:  I ordered and personally interpreted labs.  The pertinent results include:  ***   Imaging Studies ordered:  I ordered imaging studies including ***  I independently visualized and interpreted obtained imaging which showed *** I agree with the radiologist interpretation.   Cardiac Monitoring:  The patient was maintained on a cardiac monitor.  I personally viewed and interpreted the cardiac monitor which showed an underlying rhythm of: ***   Medicines ordered:  I ordered medication including ***  for ***  Reevaluation of the patient after these medicines showed that the patient:  {resolved/improved/worsened:23923::"improved"}    Test Considered:  ***   Critical Interventions:  ***   Consultations Obtained:  I consulted ***,  and discussed lab and imaging findings as well as pertinent plan of care.    Problem List / ED Course:  ***   Reevaluation:  After the interventions noted above, I reevaluated the patient and found that they have: {resolved/improved/worsened:23923::"improved"}   Social Determinants of Health:  ***   Disposition:  After consideration of the diagnostic results and the patients response to treatment, I feel that the patent would benefit from ***.    {Document critical care time when appropriate:1} {Document review of labs and clinical decision tools ie heart score, Chads2Vasc2 etc:1}  {Document your independent review of radiology images, and any outside records:1} {Document your discussion with family members, caretakers, and with consultants:1} {Document social determinants of health affecting pt's care:1} {Document your decision making why or why not admission, treatments were needed:1} Final Clinical Impression(s) / ED Diagnoses Final diagnoses:  None    Rx / DC Orders ED Discharge Orders     None

## 2023-01-02 ENCOUNTER — Emergency Department (HOSPITAL_COMMUNITY)
Admission: EM | Admit: 2023-01-02 | Discharge: 2023-01-03 | Disposition: A | Discharge status: Home / Self Care | Payer: Self-pay | Attending: Emergency Medicine | Admitting: Emergency Medicine

## 2023-01-02 ENCOUNTER — Other Ambulatory Visit: Payer: Self-pay

## 2023-01-02 DIAGNOSIS — R300 Dysuria: Secondary | ICD-10-CM | POA: Insufficient documentation

## 2023-01-02 DIAGNOSIS — R102 Pelvic and perineal pain: Secondary | ICD-10-CM | POA: Insufficient documentation

## 2023-01-02 DIAGNOSIS — G8929 Other chronic pain: Secondary | ICD-10-CM | POA: Insufficient documentation

## 2023-01-02 DIAGNOSIS — R112 Nausea with vomiting, unspecified: Secondary | ICD-10-CM | POA: Insufficient documentation

## 2023-01-02 DIAGNOSIS — R350 Frequency of micturition: Secondary | ICD-10-CM | POA: Insufficient documentation

## 2023-01-02 LAB — CBC
HCT: 43 % (ref 36.0–46.0)
Hemoglobin: 14.4 g/dL (ref 12.0–15.0)
MCH: 31.1 pg (ref 26.0–34.0)
MCHC: 33.5 g/dL (ref 30.0–36.0)
MCV: 92.9 fL (ref 80.0–100.0)
Platelets: 375 10*3/uL (ref 150–400)
RBC: 4.63 MIL/uL (ref 3.87–5.11)
RDW: 13.7 % (ref 11.5–15.5)
WBC: 5.9 10*3/uL (ref 4.0–10.5)
nRBC: 0 % (ref 0.0–0.2)

## 2023-01-02 LAB — COMPREHENSIVE METABOLIC PANEL
ALT: 24 U/L (ref 0–44)
AST: 25 U/L (ref 15–41)
Albumin: 4.6 g/dL (ref 3.5–5.0)
Alkaline Phosphatase: 51 U/L (ref 38–126)
Anion gap: 9 (ref 5–15)
BUN: 19 mg/dL (ref 6–20)
CO2: 27 mmol/L (ref 22–32)
Calcium: 9.7 mg/dL (ref 8.9–10.3)
Chloride: 101 mmol/L (ref 98–111)
Creatinine, Ser: 0.84 mg/dL (ref 0.44–1.00)
GFR, Estimated: >60 mL/min (ref >60)
Glucose, Bld: 93 mg/dL (ref 70–99)
Potassium: 3.4 mmol/L — ABNORMAL LOW (ref 3.5–5.1)
Sodium: 137 mmol/L (ref 135–145)
Total Bilirubin: 0.7 mg/dL (ref <1.2)
Total Protein: 7.6 g/dL (ref 6.5–8.1)

## 2023-01-02 LAB — URINALYSIS, W/ REFLEX TO CULTURE (INFECTION SUSPECTED)
Bilirubin Urine: NEGATIVE
Glucose, UA: NEGATIVE mg/dL
Ketones, ur: NEGATIVE mg/dL
Leukocytes,Ua: NEGATIVE
Nitrite: NEGATIVE
Protein, ur: NEGATIVE mg/dL
RBC / HPF: >50 RBC/hpf (ref 0–5)
Specific Gravity, Urine: 1.013 (ref 1.005–1.030)
pH: 6 (ref 5.0–8.0)

## 2023-01-02 LAB — HCG, SERUM, QUALITATIVE: Preg, Serum: NEGATIVE

## 2023-01-02 LAB — LIPASE, BLOOD: Lipase: 38 U/L (ref 11–51)

## 2023-01-02 MED ORDER — PROMETHAZINE HCL 25 MG RE SUPP: 25.0000 mg | Freq: Four times a day (QID) | RECTAL | 0 refills | Status: DC | PRN

## 2023-01-02 MED ORDER — OXYCODONE-ACETAMINOPHEN 5-325 MG PO TABS: 1.0000 | ORAL_TABLET | Freq: Four times a day (QID) | ORAL | 0 refills | Status: DC | PRN

## 2023-01-02 MED ORDER — FENTANYL CITRATE PF 50 MCG/ML IJ SOSY
50.0000 ug | PREFILLED_SYRINGE | Freq: Once | INTRAMUSCULAR | Status: AC
  Administered 2023-01-02: 50 ug via INTRAVENOUS
  Filled 2023-01-02: qty 1

## 2023-01-02 MED ORDER — FENTANYL CITRATE PF 50 MCG/ML IJ SOSY: 50.0000 ug | PREFILLED_SYRINGE | Freq: Once | INTRAMUSCULAR | Status: DC

## 2023-01-02 MED ORDER — HYDROMORPHONE HCL 1 MG/ML IJ SOLN
1.0000 mg | Freq: Once | INTRAMUSCULAR | Status: AC
  Administered 2023-01-02: 1 mg via INTRAVENOUS
  Filled 2023-01-02: qty 1

## 2023-01-02 MED ORDER — HYDROMORPHONE HCL 1 MG/ML IJ SOLN
0.5000 mg | Freq: Once | INTRAMUSCULAR | Status: AC
  Administered 2023-01-02: 0.5 mg via INTRAVENOUS
  Filled 2023-01-02: qty 1

## 2023-01-02 MED ORDER — ONDANSETRON HCL 4 MG/2ML IJ SOLN
4.0000 mg | Freq: Once | INTRAMUSCULAR | Status: AC
Start: 2023-01-02 — End: 2023-01-02
  Administered 2023-01-02: 4 mg via INTRAVENOUS
  Filled 2023-01-02: qty 2

## 2023-01-02 MED ORDER — ONDANSETRON 4 MG PO TBDP
4.0000 mg | ORAL_TABLET | Freq: Once | ORAL | Status: AC | PRN
  Administered 2023-01-02: 4 mg via ORAL
  Filled 2023-01-02: qty 1

## 2023-01-02 NOTE — ED Triage Notes (Signed)
Pt arrived via POV. C/o lower abd pain, N/V, and dysuria for 1x week.  Seen recently for the same

## 2023-01-02 NOTE — ED Provider Notes (Signed)
[place] EMERGENCY DEPARTMENT AT [facility] Provider Note   CSN: [id number] Arrival date & time: 01/02/23  1529     History  Chief Complaint  Patient presents with   Abdominal Pain   Dysuria   Urinary Frequency   Nausea    Krystal [name] is a 32 y.o. female with medical history of interstitial cystitis, UTIs, hemorrhagic cyst of left ovary, chronic pelvic pain.  Patient presents to ED for evaluation of pelvic pain.  She reports that this all began 1 week ago.  She was seen here on Monday and had an unremarkable workup, discharged with oxycodone.  She reports that she has had chronic pelvic pain for the last 1 year since January.  She has been seen by outside urology groups to include [facility] as well as a urology group at [facility].  She saw [facility] on 11/5.  At this time she was diagnosed with interstitial cystitis.  They plan to bring her back for some "procedures" but she is unable to expand on this.  She is endorsing persistent dysuria, dyspareunia, lower abdominal pain, nausea and vomiting.  She denies fevers, flank pain, vaginal discharge.  Reports that she recently started her menstrual cycle.  At her urology appointment on 11/5 she was started on gabapentin, Lyrica.  She is currently on Claritin but was placed on hydroxyzine 11/5 due to the fact urology thought to be much more effective in terms of helping her interstitial cystitis.  Apparently she did not tolerate amitriptyline.  Urology wanted start her on Elmiron but she does not have insurance right now.  Urology wishes to move forward with instillation therapy when she gets insurance coverage.   Abdominal Pain Associated symptoms: dysuria, nausea and vomiting   Associated symptoms: no fever and no vaginal discharge   Dysuria Associated symptoms: abdominal pain, nausea and vomiting   Associated symptoms: no fever and no vaginal discharge   Urinary  Frequency Associated symptoms include abdominal pain.       Home Medications Prior to Admission medications   Medication Sig Start Date End Date Taking? Authorizing Provider  oxyCODONE-acetaminophen (PERCOCET/ROXICET) 5-325 MG tablet Take 1 tablet by mouth every 6 (six) hours as needed for severe pain (pain score 7-10). 01/02/23  Yes Al Decant, PA-C  promethazine (PHENERGAN) 25 MG suppository Place 1 suppository (25 mg total) rectally every 6 (six) hours as needed for nausea or vomiting. 01/02/23  Yes Al Decant, PA-C  acetaminophen (TYLENOL) 500 MG tablet Take 1,000 mg by mouth daily as needed for moderate pain, headache or fever.    [provider]  albuterol (VENTOLIN HFA) 108 (90 Base) MCG/ACT inhaler Inhale 2 puffs into the lungs every 4 (four) hours as needed for wheezing or shortness of breath. 10/15/22   Ellamae Sia, DO  amLODipine (NORVASC) 10 MG tablet Take 1 tablet (10 mg total) by mouth daily. 10/06/22   Morene Crocker, MD  clonazePAM (KLONOPIN) 1 MG tablet Take 1 tablet (1 mg total) by mouth 3 (three) times daily as needed for up to 11 days for anxiety. 06/19/22 07/01/22  Quincy Simmonds, MD  EPINEPHrine 0.3 mg/0.3 mL IJ SOAJ injection Inject 0.3 mg into the muscle as needed for anaphylaxis. 01/15/21   Ellamae Sia, DO  famotidine (PEPCID) 20 MG tablet Take 1 tablet (20 mg total) by mouth 2 (two) times daily. 12/04/21   Ellamae Sia, DO  FLUoxetine (PROZAC) 40 MG capsule Take 1  capsule (40 mg total) by mouth daily. IM program 06/19/22   Quincy Simmonds, MD  hydrocortisone (CORTEF) 10 MG tablet Take 1 tablet (10 mg total) by mouth 2 (two) times daily. IM program 06/19/22   Quincy Simmonds, MD  irbesartan-hydrochlorothiazide (AVALIDE) 150-12.5 MG tablet Take 2 tablets by mouth in the morning and at bedtime. 10/06/22 10/06/23  Morene Crocker, MD  levothyroxine (SYNTHROID) 150 MCG tablet Take 1 tablet (150 mcg total) by mouth daily at 6 (six) AM. 10/06/22  10/06/23  Morene Crocker, MD  lidocaine (LIDODERM) 5 % Place 1 patch onto the skin daily. Remove & Discard patch within 12 hours or as directed by MD 05/05/22   Doran Stabler, DO  Multiple Vitamin (MULTIVITAMIN) tablet Take 1 tablet by mouth daily.    [provider]  ondansetron (ZOFRAN-ODT) 4 MG disintegrating tablet Take 1 tablet (4 mg total) by mouth every 8 (eight) hours as needed for nausea or vomiting. 12/29/22   Gloris Manchester, MD  pantoprazole (PROTONIX) 40 MG tablet Take 1 tablet (40 mg total) by mouth 2 (two) times daily before a meal. 05/05/22   Doran Stabler, DO  SYMBICORT 80-4.5 MCG/ACT inhaler Inhale 2 puffs into the lungs 2 (two) times daily as needed (shortness of breath).    [provider]      Allergies    Bee venom, Coreg [carvedilol], Red dye #40 (allura red), Compazine [prochlorperazine maleate], Oxycodone, Vicodin [hydrocodone-acetaminophen], Cymbalta [duloxetine hcl], Ditropan [oxybutynin], Elavil [amitriptyline], Motrin [ibuprofen], Naprosyn [naproxen], Pyrimethamine, Sulfadoxine, Zestril [lisinopril], Nifedical xl [nifedipine], Reglan [metoclopramide], and Trandate [labetalol]    Review of Systems   Review of Systems  Constitutional:  Negative for fever.  Gastrointestinal:  Positive for abdominal pain, nausea and vomiting.  Genitourinary:  Positive for dysuria and frequency. Negative for vaginal discharge.  All other systems reviewed and are negative.   Physical Exam Updated Vital Signs BP (!) 159/112   Pulse 64   Temp 97.6 F (36.4 C) (Oral)   Resp 18   Ht 5\' 11"  (1.803 m)   Wt 106.5 kg   LMP 12/17/2022   SpO2 94%   BMI 32.75 kg/m  Physical Exam Vitals and nursing note reviewed.  Constitutional:      General: She is not in acute distress.    Appearance: Normal appearance. She is not ill-appearing, toxic-appearing or diaphoretic.  HENT:     Head: Normocephalic and atraumatic.     Nose: Nose normal.     Mouth/Throat:     Mouth:  Mucous membranes are moist.     Pharynx: Oropharynx is clear.  Eyes:     Extraocular Movements: Extraocular movements intact.     Conjunctiva/sclera: Conjunctivae normal.     Pupils: Pupils are equal, round, and reactive to light.  Cardiovascular:     Rate and Rhythm: Normal rate and regular rhythm.  Pulmonary:     Effort: Pulmonary effort is normal.     Breath sounds: Normal breath sounds.  Abdominal:     General: Abdomen is flat. Bowel sounds are normal.     Palpations: Abdomen is soft.     Tenderness: There is abdominal tenderness.     Comments: Lower abdominal TTP, suprapubic TTP  Musculoskeletal:     Cervical back: Normal range of motion and neck supple. No tenderness.  Skin:    General: Skin is warm and dry.  Neurological:     Mental Status: She is alert and oriented to person, place, and time.     ED Results /  Procedures / Treatments   Labs (all labs ordered are listed, but only abnormal results are displayed) Labs Reviewed  COMPREHENSIVE METABOLIC PANEL - Abnormal; Notable for the following components:      Result Value   Potassium 3.4 (*)    All other components within normal limits  URINALYSIS, W/ REFLEX TO CULTURE (INFECTION SUSPECTED) - Abnormal; Notable for the following components:   APPearance HAZY (*)    Hgb urine dipstick LARGE (*)    Bacteria, UA RARE (*)    All other components within normal limits  LIPASE, BLOOD  CBC  HCG, SERUM, QUALITATIVE    EKG None  Radiology No results found.  Procedures Procedures   Medications Ordered in ED Medications  ondansetron (ZOFRAN-ODT) disintegrating tablet 4 mg (4 mg Oral Given 01/02/23 1609)  ondansetron (ZOFRAN) injection 4 mg (4 mg Intravenous Given 01/02/23 2103)  fentaNYL (SUBLIMAZE) injection 50 mcg (50 mcg Intravenous Given 01/02/23 2104)  HYDROmorphone (DILAUDID) injection 0.5 mg (0.5 mg Intravenous Given 01/02/23 2123)  HYDROmorphone (DILAUDID) injection 1 mg (1 mg Intravenous Given 01/02/23 2257)     ED Course/ Medical Decision Making/ A&P Clinical Course as of 01/02/23 2325  Fri Jan 02, 2023  2011 Chronic bladder pain since January. Been ongoing for 1 week. Dysuria, on her menstrual cycle but states not menstrual pain. No fevers. Nausea and vomiting. Unable to keep down pain medication at home.  [CG]    Clinical Course User Index [CG] Al Decant, PA-C   Medical Decision Making Amount and/or Complexity of Data Reviewed Labs: ordered.  Risk Prescription drug management.   32 year old female presents to ED for evaluation.  Please see HPI for further details.  On examination patient is afebrile and nontachycardic.  Her lung sounds are clear bilaterally, she is not hypoxic.  Her abdomen is soft and compressible with tenderness in her lower abdominal fields and in her suprapubic region.  She has no rebound or guarding.  She is in an extreme amount of pain rolling around on the bed.  Chart reviewed.  The patient has had history of chronic abdominal pain and pelvic pain for the last 1 year.  She has seen multiple urology groups and she is currently in the process of getting worked up at [facility].  Patient was seen in the ED on 11/4 and had CT abdomen pelvis obtained which is unremarkable.  No indications for imaging patient tonight.  Patient CBC shows no leukocytosis or anemia.  Metabolic panel shows potassium 3.4 but no other electrolyte derangement.  Lipase WNL.  Urinalysis unremarkable except for large hemoglobin patient reports he is on her menstrual cycle.  Attempted to treat patient pain here with 50 mcg of fentanyl, 4 OF Zofran for nausea.  On reassessment patient continues to complain of pain.  0.5 mg administered of Dilaudid.  Patient continues to state her pain is 7 out of 10.  Patient offered pain control admission at this time as well as 1 mg of Dilaudid for a total of 1.5 mg of Dilaudid.  Patient is deferring on pain control this time.   She states that she will follow-up with her urology This week.  She is requesting suppositories as well as pain medication to take home.  Will send her home with small amount of pain medication, 5 oxycodones.  She was given return precautions and she voiced understanding.  Stable to discharge home.   Final Clinical Impression(s) / ED Diagnoses Final diagnoses:  Chronic pelvic pain  in female    Rx / DC Orders ED Discharge Orders          Ordered    promethazine (PHENERGAN) 25 MG suppository  Every 6 hours PRN        01/02/23 2325    oxyCODONE-acetaminophen (PERCOCET/ROXICET) 5-325 MG tablet  Every 6 hours PRN        01/02/23 2325              Al Decant, PA-C 01/02/23 2325    Linwood Dibbles, MD 01/03/23 1500

## 2023-01-02 NOTE — Discharge Instructions (Signed)
Please follow-up with urology group.  Please begin taking Phenergan suppositories as needed as well as pain medication.  Please return to the ED with any new or worsening signs or symptoms.

## 2023-01-25 ENCOUNTER — Encounter (HOSPITAL_COMMUNITY): Payer: Self-pay | Admitting: Emergency Medicine

## 2023-01-25 ENCOUNTER — Other Ambulatory Visit: Payer: Self-pay

## 2023-01-25 ENCOUNTER — Emergency Department (HOSPITAL_COMMUNITY)
Admission: EM | Admit: 2023-01-25 | Discharge: 2023-01-25 | Disposition: A | Discharge status: Home / Self Care | Payer: Self-pay | Attending: Emergency Medicine | Admitting: Emergency Medicine

## 2023-01-25 ENCOUNTER — Emergency Department (HOSPITAL_COMMUNITY): Payer: Self-pay

## 2023-01-25 DIAGNOSIS — G8929 Other chronic pain: Secondary | ICD-10-CM | POA: Insufficient documentation

## 2023-01-25 DIAGNOSIS — J45909 Unspecified asthma, uncomplicated: Secondary | ICD-10-CM | POA: Insufficient documentation

## 2023-01-25 DIAGNOSIS — R102 Pelvic and perineal pain: Secondary | ICD-10-CM | POA: Insufficient documentation

## 2023-01-25 DIAGNOSIS — I10 Essential (primary) hypertension: Secondary | ICD-10-CM | POA: Insufficient documentation

## 2023-01-25 DIAGNOSIS — Z79899 Other long term (current) drug therapy: Secondary | ICD-10-CM | POA: Insufficient documentation

## 2023-01-25 DIAGNOSIS — R112 Nausea with vomiting, unspecified: Secondary | ICD-10-CM | POA: Insufficient documentation

## 2023-01-25 LAB — URINALYSIS, ROUTINE W REFLEX MICROSCOPIC
Bilirubin Urine: NEGATIVE
Glucose, UA: NEGATIVE mg/dL
Ketones, ur: NEGATIVE mg/dL
Leukocytes,Ua: NEGATIVE
Nitrite: NEGATIVE
Protein, ur: NEGATIVE mg/dL
Specific Gravity, Urine: 1.01 (ref 1.005–1.030)
pH: 7 (ref 5.0–8.0)

## 2023-01-25 LAB — HCG, SERUM, QUALITATIVE: Preg, Serum: NEGATIVE

## 2023-01-25 LAB — COMPREHENSIVE METABOLIC PANEL
ALT: 20 U/L (ref 0–44)
AST: 22 U/L (ref 15–41)
Albumin: 4.2 g/dL (ref 3.5–5.0)
Alkaline Phosphatase: 48 U/L (ref 38–126)
Anion gap: 10 (ref 5–15)
BUN: 18 mg/dL (ref 6–20)
CO2: 26 mmol/L (ref 22–32)
Calcium: 9.3 mg/dL (ref 8.9–10.3)
Chloride: 102 mmol/L (ref 98–111)
Creatinine, Ser: 0.95 mg/dL (ref 0.44–1.00)
GFR, Estimated: >60 mL/min (ref >60)
Glucose, Bld: 96 mg/dL (ref 70–99)
Potassium: 3.8 mmol/L (ref 3.5–5.1)
Sodium: 138 mmol/L (ref 135–145)
Total Bilirubin: 0.7 mg/dL (ref <1.2)
Total Protein: 7.1 g/dL (ref 6.5–8.1)

## 2023-01-25 LAB — CBC
HCT: 41.3 % (ref 36.0–46.0)
Hemoglobin: 14 g/dL (ref 12.0–15.0)
MCH: 32 pg (ref 26.0–34.0)
MCHC: 33.9 g/dL (ref 30.0–36.0)
MCV: 94.5 fL (ref 80.0–100.0)
Platelets: 317 10*3/uL (ref 150–400)
RBC: 4.37 MIL/uL (ref 3.87–5.11)
RDW: 13.2 % (ref 11.5–15.5)
WBC: 6.3 10*3/uL (ref 4.0–10.5)
nRBC: 0 % (ref 0.0–0.2)

## 2023-01-25 LAB — WET PREP, GENITAL
Clue Cells Wet Prep HPF POC: NONE SEEN
Sperm: NONE SEEN
Trich, Wet Prep: NONE SEEN
WBC, Wet Prep HPF POC: >=10 — AB (ref <10)
Yeast Wet Prep HPF POC: NONE SEEN

## 2023-01-25 LAB — LIPASE, BLOOD: Lipase: 33 U/L (ref 11–51)

## 2023-01-25 MED ORDER — HYDROMORPHONE HCL 1 MG/ML IJ SOLN
1.0000 mg | Freq: Once | INTRAMUSCULAR | Status: AC
  Administered 2023-01-25: 1 mg via INTRAVENOUS
  Filled 2023-01-25: qty 1

## 2023-01-25 MED ORDER — DOXYCYCLINE HYCLATE 100 MG PO CAPS: 100.0000 mg | ORAL_CAPSULE | Freq: Two times a day (BID) | ORAL | 0 refills | Status: AC

## 2023-01-25 MED ORDER — ONDANSETRON 4 MG PO TBDP
4.0000 mg | ORAL_TABLET | Freq: Once | ORAL | Status: AC
  Administered 2023-01-25: 4 mg via ORAL
  Filled 2023-01-25: qty 1

## 2023-01-25 MED ORDER — OXYCODONE HCL 5 MG PO TABS: 5.0000 mg | ORAL_TABLET | Freq: Four times a day (QID) | ORAL | 0 refills | Status: DC | PRN

## 2023-01-25 MED ORDER — SODIUM CHLORIDE 0.9 % IV SOLN
12.5000 mg | Freq: Once | INTRAVENOUS | Status: AC
  Administered 2023-01-25: 12.5 mg via INTRAVENOUS
  Filled 2023-01-25: qty 12.5

## 2023-01-25 MED ORDER — KETOROLAC TROMETHAMINE 30 MG/ML IJ SOLN
15.0000 mg | Freq: Once | INTRAMUSCULAR | Status: AC
  Administered 2023-01-25: 15 mg via INTRAVENOUS
  Filled 2023-01-25: qty 1

## 2023-01-25 MED ORDER — SODIUM CHLORIDE 0.9 % IV SOLN
1.0000 g | Freq: Once | INTRAVENOUS | Status: AC
Start: 2023-01-25 — End: 2023-01-25
  Administered 2023-01-25: 1 g via INTRAVENOUS
  Filled 2023-01-25: qty 10

## 2023-01-25 MED ORDER — METRONIDAZOLE 500 MG PO TABS: 500.0000 mg | ORAL_TABLET | Freq: Two times a day (BID) | ORAL | 0 refills | Status: AC

## 2023-01-25 MED ORDER — MORPHINE SULFATE (PF) 4 MG/ML IV SOLN
4.0000 mg | Freq: Once | INTRAVENOUS | Status: AC
  Administered 2023-01-25: 4 mg via INTRAVENOUS
  Filled 2023-01-25: qty 1

## 2023-01-25 MED ORDER — IOHEXOL 300 MG/ML  SOLN
100.0000 mL | Freq: Once | INTRAMUSCULAR | Status: AC | PRN
  Administered 2023-01-25: 100 mL via INTRAVENOUS

## 2023-01-25 NOTE — ED Provider Notes (Cosign Needed Addendum)
[place] EMERGENCY DEPARTMENT AT [facility] Provider Note   CSN: [id number] Arrival date & time: 01/25/23  1105     History  Chief Complaint  Patient presents with   Pelvic Pain   Nausea   Emesis    Krystal [name] is a 32 y.o. female.  With a history of anxiety, depression, fibromyalgia, GERD, asthma, PTSD, hypertension, chronic lower abdominal pain, interstitial cystitis presenting to the ED for evaluation of acute on chronic lower abdominal pain.  Symptoms began yesterday.  Pain became constant at 4:00 this morning.  She reports intermittent left-sided back and flank pain as well.  She reports numerous episodes of vomiting secondary to the pain.  She is currently being managed by urology for her interstitial cystitis and is on hydroxyzine and gabapentin for her symptoms.  She has not been sexually active for the past 3 months secondary to the pain.  She denies history of STDs.  She reports some chills but no fevers.  No upper abdominal pain.  She reports a stinging pain when she urinates.   Pelvic Pain  Emesis      Home Medications Prior to Admission medications   Medication Sig Start Date End Date Taking? Authorizing Provider  doxycycline (VIBRAMYCIN) 100 MG capsule Take 1 capsule (100 mg total) by mouth 2 (two) times daily for 14 days. 01/25/23 02/08/23 Yes Kendle Erker, Edsel Petrin, PA-C  metroNIDAZOLE (FLAGYL) 500 MG tablet Take 1 tablet (500 mg total) by mouth 2 (two) times daily for 14 days. 01/25/23 02/08/23 Yes Shandiin Eisenbeis, Edsel Petrin, PA-C  oxyCODONE (ROXICODONE) 5 MG immediate release tablet Take 1 tablet (5 mg total) by mouth every 6 (six) hours as needed for severe pain (pain score 7-10). 01/25/23  Yes Cesar Alf, Edsel Petrin, PA-C  acetaminophen (TYLENOL) 500 MG tablet Take 1,000 mg by mouth daily as needed for moderate pain, headache or fever.    [provider]  albuterol (VENTOLIN HFA) 108 (90 Base) MCG/ACT inhaler Inhale 2 puffs into the lungs every 4  (four) hours as needed for wheezing or shortness of breath. 10/15/22   Ellamae Sia, DO  amLODipine (NORVASC) 10 MG tablet Take 1 tablet (10 mg total) by mouth daily. 10/06/22   Morene Crocker, MD  clonazePAM (KLONOPIN) 1 MG tablet Take 1 tablet (1 mg total) by mouth 3 (three) times daily as needed for up to 11 days for anxiety. 06/19/22 07/01/22  Quincy Simmonds, MD  EPINEPHrine 0.3 mg/0.3 mL IJ SOAJ injection Inject 0.3 mg into the muscle as needed for anaphylaxis. 01/15/21   Ellamae Sia, DO  famotidine (PEPCID) 20 MG tablet Take 1 tablet (20 mg total) by mouth 2 (two) times daily. 12/04/21   Ellamae Sia, DO  FLUoxetine (PROZAC) 40 MG capsule Take 1 capsule (40 mg total) by mouth daily. IM program 06/19/22   Quincy Simmonds, MD  hydrocortisone (CORTEF) 10 MG tablet Take 1 tablet (10 mg total) by mouth 2 (two) times daily. IM program 06/19/22   Quincy Simmonds, MD  irbesartan-hydrochlorothiazide (AVALIDE) 150-12.5 MG tablet Take 2 tablets by mouth in the morning and at bedtime. 10/06/22 10/06/23  Morene Crocker, MD  levothyroxine (SYNTHROID) 150 MCG tablet Take 1 tablet (150 mcg total) by mouth daily at 6 (six) AM. 10/06/22 10/06/23  Morene Crocker, MD  lidocaine (LIDODERM) 5 % Place 1 patch onto the skin daily. Remove & Discard patch within 12 hours or as directed by MD 05/05/22   Doran Stabler, DO  Multiple Vitamin (MULTIVITAMIN) tablet  Take 1 tablet by mouth daily.    [provider]  ondansetron (ZOFRAN-ODT) 4 MG disintegrating tablet Take 1 tablet (4 mg total) by mouth every 8 (eight) hours as needed for nausea or vomiting. 12/29/22   Gloris Manchester, MD  oxyCODONE-acetaminophen (PERCOCET/ROXICET) 5-325 MG tablet Take 1 tablet by mouth every 6 (six) hours as needed for severe pain (pain score 7-10). 01/02/23   Al Decant, PA-C  pantoprazole (PROTONIX) 40 MG tablet Take 1 tablet (40 mg total) by mouth 2 (two) times daily before a meal. 05/05/22   Doran Stabler, DO  promethazine  (PHENERGAN) 25 MG suppository Place 1 suppository (25 mg total) rectally every 6 (six) hours as needed for nausea or vomiting. 01/02/23   Al Decant, PA-C  SYMBICORT 80-4.5 MCG/ACT inhaler Inhale 2 puffs into the lungs 2 (two) times daily as needed (shortness of breath).    [provider]      Allergies    Bee venom, Coreg [carvedilol], Red dye #40 (allura red), Compazine [prochlorperazine maleate], Oxycodone, Vicodin [hydrocodone-acetaminophen], Cymbalta [duloxetine hcl], Ditropan [oxybutynin], Elavil [amitriptyline], Motrin [ibuprofen], Naprosyn [naproxen], Pyrimethamine, Sulfadoxine, Zestril [lisinopril], Nifedical xl [nifedipine], Reglan [metoclopramide], and Trandate [labetalol]    Review of Systems   Review of Systems  Gastrointestinal:  Positive for vomiting.  Genitourinary:  Positive for dysuria and pelvic pain.  All other systems reviewed and are negative.   Physical Exam Updated Vital Signs BP (!) 131/90   Pulse (!) 58   Temp 97.8 F (36.6 C)   Resp 14   Ht 5\' 11"  (1.803 m)   Wt 108.9 kg   LMP 12/17/2022   SpO2 98%   BMI 33.47 kg/m  Physical Exam Vitals and nursing note reviewed. Exam conducted with a chaperone present Lynwood Dawley, paramedic).  Constitutional:      General: She is not in acute distress.    Appearance: She is well-developed. She is not ill-appearing.     Comments: Resting in bed, appears uncomfortable  HENT:     Head: Normocephalic and atraumatic.  Eyes:     Conjunctiva/sclera: Conjunctivae normal.  Cardiovascular:     Rate and Rhythm: Normal rate and regular rhythm.     Heart sounds: No murmur heard. Pulmonary:     Effort: Pulmonary effort is normal. No respiratory distress.     Breath sounds: Normal breath sounds.  Abdominal:     Palpations: Abdomen is soft.     Tenderness: There is no abdominal tenderness.  Genitourinary:    Comments: No pubic rashes or lesions.  Small amount of blood in the vaginal cul-de-sac consistent with  menstrual bleeding.  No active cervical hemorrhage.  No purulent drainage.  Significant cervical motion and lower abdominal tenderness throughout Musculoskeletal:        General: No swelling.     Cervical back: Neck supple.  Skin:    General: Skin is warm and dry.     Capillary Refill: Capillary refill takes less than 2 seconds.  Neurological:     Mental Status: She is alert.  Psychiatric:        Mood and Affect: Mood normal.     ED Results / Procedures / Treatments   Labs (all labs ordered are listed, but only abnormal results are displayed) Labs Reviewed  WET PREP, GENITAL - Abnormal; Notable for the following components:      Result Value   WBC, Wet Prep HPF POC >=10 (*)    All other components within normal limits  URINALYSIS, ROUTINE  W REFLEX MICROSCOPIC - Abnormal; Notable for the following components:   Hgb urine dipstick LARGE (*)    Bacteria, UA RARE (*)    All other components within normal limits  LIPASE, BLOOD  COMPREHENSIVE METABOLIC PANEL  CBC  HCG, SERUM, QUALITATIVE  GC/CHLAMYDIA PROBE AMP ([place]) NOT AT [facility]    EKG None  Radiology CT ABDOMEN PELVIS W CONTRAST  Result Date: 01/25/2023 CLINICAL DATA:  Bladder pain.  Nausea and vomiting. EXAM: CT ABDOMEN AND PELVIS WITH CONTRAST TECHNIQUE: Multidetector CT imaging of the abdomen and pelvis was performed using the standard protocol following bolus administration of intravenous contrast. RADIATION DOSE REDUCTION: This exam was performed according to the departmental dose-optimization program which includes automated exposure control, adjustment of the mA and/or kV according to patient size and/or use of iterative reconstruction technique. CONTRAST:  OMNIPAQUE IOHEXOL 300 MG/ML  SOLN COMPARISON:  CT 12/29/2022.  Older exams as well FINDINGS: Lower chest: There is some linear opacity lung bases likely scar or atelectasis. No pleural effusion. Some breathing motion. Hepatobiliary: No focal liver abnormality  is seen. Status post cholecystectomy. No biliary dilatation. Patent portal vein. Pancreas: Unremarkable. No pancreatic ductal dilatation or surrounding inflammatory changes. Spleen: Lobular contours of the spleen with a small splenule towards the margin of the pancreas. Adrenals/Urinary Tract: Adrenal glands are preserved. No enhancing renal mass or collecting system dilatation. Lobular kidneys. Tiny Bosniak 2 cysts along the lower pole left kidney. No specific imaging follow-up. The ureters have normal course and caliber down to the bladder. Preserved contours of the underdistended urinary bladder. Stomach/Bowel: Stomach is within normal limits. Appendix appears normal. No evidence of bowel wall thickening, distention, or inflammatory changes. Vascular/Lymphatic: No significant vascular findings are present. No enlarged abdominal or pelvic lymph nodes. Reproductive: Uterus and bilateral adnexa are unremarkable. Other: Trace free fluid in the pelvis.  No free air. Musculoskeletal: Mild degenerative changes along the spine. Sclerotic focus along the left femur is unchanged from previous and may be a benign chondroid lesion. By report this has been present since at least 2017. IMPRESSION: No bowel obstruction, free air. Scattered stool. Likely physiologic trace free fluid in the pelvis. Previous cholecystectomy. Electronically Signed   By: Karen Kays M.D.   On: 01/25/2023 15:18    Procedures Procedures    Medications Ordered in ED Medications  ondansetron (ZOFRAN-ODT) disintegrating tablet 4 mg (4 mg Oral Given 01/25/23 1126)  promethazine (PHENERGAN) 12.5 mg in sodium chloride 0.9 % 50 mL IVPB (0 mg Intravenous Stopped 01/25/23 1505)  HYDROmorphone (DILAUDID) injection 1 mg (1 mg Intravenous Given 01/25/23 1308)  HYDROmorphone (DILAUDID) injection 1 mg (1 mg Intravenous Given 01/25/23 1345)  iohexol (OMNIPAQUE) 300 MG/ML solution 100 mL (100 mLs Intravenous Contrast Given 01/25/23 1438)  ketorolac (TORADOL)  30 MG/ML injection 15 mg (15 mg Intravenous Given 01/25/23 1512)  cefTRIAXone (ROCEPHIN) 1 g in sodium chloride 0.9 % 100 mL IVPB (1 g Intravenous New Bag/Given 01/25/23 1622)  morphine (PF) 4 MG/ML injection 4 mg (4 mg Intravenous Given 01/25/23 1622)    ED Course/ Medical Decision Making/ A&P                                 Medical Decision Making Amount and/or Complexity of Data Reviewed Labs: ordered. Radiology: ordered.  Risk Prescription drug management.  This patient presents to the ED for concern of lower abdominal pain, this involves an extensive number of treatment  options, and is a complaint that carries with it a high risk of complications and morbidity. Differential diagnosis of her lower abdominal considerations include pelvic inflammatory disease, ectopic pregnancy, appendicitis, urinary calculi, primary dysmenorrhea, septic abortion, ruptured ovarian cyst or tumor, ovarian torsion, tubo-ovarian abscess, degeneration of fibroid, endometriosis, diverticulitis, cystitis.   My initial workup includes labs, imaging, symptom control  Additional history obtained from: Nursing notes from this visit. Previous records within EMR system ED visits for similar on 12/29/2022 and 01/02/2023 Urology visit on 12/30/2022 with Dr. Logan Bores.  Currently does not have insurance and cannot afford [employer].  I ordered, reviewed and interpreted labs which include: CBC, CMP, urinalysis, lipase, hCG, STI panel.  No leukocytosis or anemia.  Urine with large hemoglobin, rare bacteria, no white blood cells  I ordered imaging studies including CT abdomen pelvis I independently visualized and interpreted imaging which showed no acute abnormalities outside of trace free fluid in the abdomen/pelvis which is likely physiologic I agree with the radiologist interpretation  Afebrile, hemodynamically stable.  32 year old female presenting for evaluation of pelvic pain.  This is a chronic issue, she believes this  stems from her interstitial cystitis.  She cannot currently afford to undergo the treatment as suggested by her urologist.  She has been taking gabapentin and hydroxyzine with minimal improvement in her symptoms.  Her symptoms got acutely worse yesterday.  States this is consistent with her chronic pain.  She has some nausea and vomiting as well.  Lab workup was reassuring in the emergency department.  Urine with large hemoglobin consistent with IC.  CT abdomen pelvis with trace pelvic free fluid.  Physical exam with significant lower abdominal tenderness and cervical motion tenderness.  Cannot rule out pelvic inflammatory disease as an etiology for her symptoms so we will initiate treatment with single dose of IV Rocephin, prescriptions for doxycycline and metronidazole.  She reported improvement in her symptoms after treatment in the emergency department but is still feeling some pain.  Will send short course of opioid pain medication and educated on potential side effects.  She was encouraged to follow-up with a urologist as soon as possible for reevaluation.  Stable at discharge.  At this time there does not appear to be any evidence of an acute emergency medical condition and the patient appears stable for discharge with appropriate outpatient follow up. Diagnosis was discussed with patient who verbalizes understanding of care plan and is agreeable to discharge. I have discussed return precautions with patient who verbalizes understanding. Patient encouraged to follow-up with their PCP within 1 week. All questions answered.  Patient's case discussed with Dr. Wallace Cullens who agrees with plan to discharge with follow-up.   Note: Portions of this report may have been transcribed using voice recognition software. Every effort was made to ensure accuracy; however, inadvertent computerized transcription errors may still be present.        Final Clinical Impression(s) / ED Diagnoses Final diagnoses:  Chronic  pelvic pain in female    Rx / DC Orders ED Discharge Orders          Ordered    doxycycline (VIBRAMYCIN) 100 MG capsule  2 times daily        01/25/23 1617    metroNIDAZOLE (FLAGYL) 500 MG tablet  2 times daily        01/25/23 1617    oxyCODONE (ROXICODONE) 5 MG immediate release tablet  Every 6 hours PRN        01/25/23 1617  Michelle Piper, PA-C 01/25/23 1657    Lula Olszewski Edsel Petrin, PA-C 01/25/23 1659    Sloan Leiter, DO 01/29/23 [phone]

## 2023-01-25 NOTE — ED Triage Notes (Signed)
Patient presents due to a flair up of bladder pain, nausea and vomiting. Symptoms started yesterday and have worsened since.

## 2023-01-25 NOTE — Discharge Instructions (Signed)
You have been seen today for your complaint of lower abdominal pain.  I am treating you for pelvic inflammatory disease. Your lab work was reassuring. Your imaging was reassuring. Your discharge medications include metronidazole and doxycycline. This is an antibiotic. You should take it as prescribed. You should take it for the entire duration of the prescription. This may cause an upset stomach. This is normal. You may take this with food. You may also eat yogurt to prevent diarrhea. Follow up with: Your primary care provider in 1 week for reevaluation Please seek immediate medical care if you develop any of the following symptoms: You have more pain in the belly area. Your symptoms are getting worse. You are not better in 72 hours with treatment. At this time there does not appear to be the presence of an emergent medical condition, however there is always the potential for conditions to change. Please read and follow the below instructions.  Do not take your medicine if  develop an itchy rash, swelling in your mouth or lips, or difficulty breathing; call 911 and seek immediate emergency medical attention if this occurs.  You may review your lab tests and imaging results in their entirety on your MyChart account.  Please discuss all results of fully with your primary care provider and other specialist at your follow-up visit.  Note: Portions of this text may have been transcribed using voice recognition software. Every effort was made to ensure accuracy; however, inadvertent computerized transcription errors may still be present.

## 2023-01-26 ENCOUNTER — Other Ambulatory Visit (HOSPITAL_COMMUNITY): Payer: Self-pay

## 2023-01-26 LAB — GC/CHLAMYDIA PROBE AMP (~~LOC~~) NOT AT ARMC
Chlamydia: NEGATIVE
Comment: NEGATIVE
Comment: NORMAL
Neisseria Gonorrhea: NEGATIVE

## 2023-01-30 ENCOUNTER — Other Ambulatory Visit (HOSPITAL_COMMUNITY): Payer: Self-pay

## 2023-02-23 ENCOUNTER — Other Ambulatory Visit: Payer: Self-pay

## 2023-03-09 ENCOUNTER — Emergency Department (HOSPITAL_COMMUNITY)
Admission: EM | Admit: 2023-03-09 | Discharge: 2023-03-09 | Disposition: A | Discharge status: Home / Self Care | Payer: Self-pay | Attending: Emergency Medicine | Admitting: Emergency Medicine

## 2023-03-09 ENCOUNTER — Other Ambulatory Visit: Payer: Self-pay

## 2023-03-09 ENCOUNTER — Encounter (HOSPITAL_COMMUNITY): Payer: Self-pay

## 2023-03-09 ENCOUNTER — Emergency Department (HOSPITAL_COMMUNITY): Payer: Self-pay

## 2023-03-09 DIAGNOSIS — G8929 Other chronic pain: Secondary | ICD-10-CM | POA: Insufficient documentation

## 2023-03-09 DIAGNOSIS — I10 Essential (primary) hypertension: Secondary | ICD-10-CM | POA: Insufficient documentation

## 2023-03-09 DIAGNOSIS — R112 Nausea with vomiting, unspecified: Secondary | ICD-10-CM

## 2023-03-09 DIAGNOSIS — R102 Pelvic and perineal pain: Secondary | ICD-10-CM | POA: Insufficient documentation

## 2023-03-09 DIAGNOSIS — Z79899 Other long term (current) drug therapy: Secondary | ICD-10-CM | POA: Insufficient documentation

## 2023-03-09 LAB — BASIC METABOLIC PANEL
Anion gap: 7 (ref 5–15)
BUN: 14 mg/dL (ref 6–20)
CO2: 24 mmol/L (ref 22–32)
Calcium: 8.9 mg/dL (ref 8.9–10.3)
Chloride: 103 mmol/L (ref 98–111)
Creatinine, Ser: 0.78 mg/dL (ref 0.44–1.00)
GFR, Estimated: >60 mL/min (ref >60)
Glucose, Bld: 105 mg/dL — ABNORMAL HIGH (ref 70–99)
Potassium: 3.7 mmol/L (ref 3.5–5.1)
Sodium: 134 mmol/L — ABNORMAL LOW (ref 135–145)

## 2023-03-09 LAB — URINALYSIS, ROUTINE W REFLEX MICROSCOPIC
Bilirubin Urine: NEGATIVE
Glucose, UA: NEGATIVE mg/dL
Hgb urine dipstick: NEGATIVE
Ketones, ur: NEGATIVE mg/dL
Leukocytes,Ua: NEGATIVE
Nitrite: NEGATIVE
Protein, ur: NEGATIVE mg/dL
Specific Gravity, Urine: 1.005 (ref 1.005–1.030)
pH: 7 (ref 5.0–8.0)

## 2023-03-09 LAB — CBC
HCT: 41 % (ref 36.0–46.0)
Hemoglobin: 14.1 g/dL (ref 12.0–15.0)
MCH: 32.5 pg (ref 26.0–34.0)
MCHC: 34.4 g/dL (ref 30.0–36.0)
MCV: 94.5 fL (ref 80.0–100.0)
Platelets: 326 10*3/uL (ref 150–400)
RBC: 4.34 MIL/uL (ref 3.87–5.11)
RDW: 13 % (ref 11.5–15.5)
WBC: 7.3 10*3/uL (ref 4.0–10.5)
nRBC: 0 % (ref 0.0–0.2)

## 2023-03-09 LAB — HCG, SERUM, QUALITATIVE: Preg, Serum: NEGATIVE

## 2023-03-09 MED ORDER — ONDANSETRON HCL 4 MG PO TABS: 4.0000 mg | ORAL_TABLET | Freq: Three times a day (TID) | ORAL | 0 refills | Status: DC | PRN

## 2023-03-09 MED ORDER — ONDANSETRON HCL 4 MG/2ML IJ SOLN
4.0000 mg | Freq: Once | INTRAMUSCULAR | Status: AC
  Administered 2023-03-09: 4 mg via INTRAVENOUS
  Filled 2023-03-09: qty 2

## 2023-03-09 MED ORDER — ONDANSETRON 4 MG PO TBDP
4.0000 mg | ORAL_TABLET | Freq: Once | ORAL | Status: AC
  Administered 2023-03-09: 4 mg via ORAL
  Filled 2023-03-09: qty 1

## 2023-03-09 MED ORDER — DIPHENHYDRAMINE HCL 50 MG/ML IJ SOLN
12.5000 mg | Freq: Once | INTRAMUSCULAR | Status: AC
Start: 2023-03-09 — End: 2023-03-09
  Administered 2023-03-09: 12.5 mg via INTRAVENOUS
  Filled 2023-03-09: qty 1

## 2023-03-09 MED ORDER — OXYCODONE HCL 5 MG PO TABS: 10.0000 mg | ORAL_TABLET | Freq: Four times a day (QID) | ORAL | 0 refills | Status: DC | PRN

## 2023-03-09 MED ORDER — HYDROMORPHONE HCL 1 MG/ML IJ SOLN
1.0000 mg | Freq: Once | INTRAMUSCULAR | Status: AC
  Administered 2023-03-09: 1 mg via INTRAVENOUS
  Filled 2023-03-09: qty 1

## 2023-03-09 MED ORDER — MORPHINE SULFATE (PF) 4 MG/ML IV SOLN
4.0000 mg | Freq: Once | INTRAVENOUS | Status: AC
  Administered 2023-03-09: 4 mg via INTRAVENOUS
  Filled 2023-03-09: qty 1

## 2023-03-09 MED ORDER — SODIUM CHLORIDE 0.9 % IV BOLUS
1000.0000 mL | Freq: Once | INTRAVENOUS | Status: AC
  Administered 2023-03-09: 1000 mL via INTRAVENOUS

## 2023-03-09 MED ORDER — LORAZEPAM 2 MG/ML IJ SOLN
1.0000 mg | Freq: Once | INTRAMUSCULAR | Status: AC
Start: 2023-03-09 — End: 2023-03-09
  Administered 2023-03-09: 1 mg via INTRAVENOUS
  Filled 2023-03-09: qty 1

## 2023-03-09 MED ORDER — KETOROLAC TROMETHAMINE 30 MG/ML IJ SOLN
30.0000 mg | Freq: Once | INTRAMUSCULAR | Status: AC
  Administered 2023-03-09: 30 mg via INTRAVENOUS
  Filled 2023-03-09: qty 1

## 2023-03-09 MED ORDER — HYDROMORPHONE HCL 1 MG/ML IJ SOLN
0.5000 mg | Freq: Once | INTRAMUSCULAR | Status: AC
  Administered 2023-03-09: 0.5 mg via INTRAVENOUS
  Filled 2023-03-09: qty 1

## 2023-03-09 MED ORDER — OXYCODONE-ACETAMINOPHEN 5-325 MG PO TABS
1.0000 | ORAL_TABLET | Freq: Once | ORAL | Status: AC
  Administered 2023-03-09: 1 via ORAL
  Filled 2023-03-09: qty 1

## 2023-03-09 MED ORDER — DROPERIDOL 2.5 MG/ML IJ SOLN
1.2500 mg | Freq: Once | INTRAMUSCULAR | Status: AC
  Administered 2023-03-09: 1.25 mg via INTRAVENOUS
  Filled 2023-03-09: qty 2

## 2023-03-09 NOTE — Discharge Instructions (Addendum)
 Please talk to your primary care clinic about a referral to pain management clinic, or search for a local pain management clinic that would be in your insurance network.  You likely need to see a specialist to better control your pain.

## 2023-03-09 NOTE — ED Provider Notes (Signed)
 33 yo female here with intractable pelvic pain, nausea and vomiting She has received multiple rounds of IV pain and nausea medications, including dilaudid , ativan , and now droperidol  at 4:15 PM.  Until now she has had persistent vomiting and cannot drink.  Plan to reassess after this most recent round of anti-emetics - if unable to tolerate PO, she will require admission.  No further narcotics are indicated.  Physical Exam  BP (!) 178/109   Pulse 71   Temp (!) 97.3 F (36.3 C) (Oral)   Resp 18   Ht 5' 11 (1.803 m)   Wt 101.2 kg   SpO2 99%   BMI 31.10 kg/m   Physical Exam  Procedures  Procedures  ED Course / MDM    Medical Decision Making Amount and/or Complexity of Data Reviewed Labs: ordered. Radiology: ordered.  Risk Prescription drug management.   The patient was reassessed after 7 and half hours in the ED, multiple rounds of pain medications.  Her pain was under better control, she was able to tolerate fluids and ginger ale.  She is comfortable going home.  I did offer her a short course of opioid narcotics for breakthrough pain, but I made it clear that she needs to seek out pain management specialty clinic at this point, given her reported chronic significant daily ongoing pain.  She verbalized understanding.       Cottie Donnice PARAS, MD 03/09/23 [phone]

## 2023-03-09 NOTE — ED Notes (Signed)
 Pt in bathroom

## 2023-03-09 NOTE — ED Provider Triage Note (Signed)
 Emergency Medicine Provider Triage Evaluation Note  Krystal [name] , a 33 y.o. female  was evaluated in triage.  Pt complains of back pain radiating to lower abdomen and groin started 3 days ago. Previous Hx of chronic cystitis. Has not seen urologist but has appointment in February. Denies any sexual activity for last 3 months. Denies percocet allergy  Review of Systems  Positive: Chills, n/v 6 episodes/day, shortness of breath, dysuria, vertigo, incontinence, diarrhea, vaginal discharge Negative: Fever, chest pain, hematuria  Physical Exam  BP (!) 179/107   Pulse 71   Temp 97.6 F (36.4 C) (Oral)   Resp 18   Ht 5' 11 (1.803 m)   Wt 101.2 kg   SpO2 99%   BMI 31.10 kg/m  Gen:   Awake, tearful, in obvious pain Resp:  Normal effort  MSK:   Moves extremities without difficulty  Other:    Medical Decision Making  Medically screening exam initiated at 12:00 PM.  Appropriate orders placed.  Krystal [name] was informed that the remainder of the evaluation will be completed by another provider, this initial triage assessment does not replace that evaluation, and the importance of remaining in the ED until their evaluation is complete.     Krystal [name], [place] 03/09/23 1210

## 2023-03-09 NOTE — ED Provider Notes (Signed)
 [place] EMERGENCY DEPARTMENT AT [facility] Provider Note   CSN: [id number] Arrival date & time: 03/09/23  1133     History  Chief Complaint  Patient presents with   Pelvic Pain    Krystal [name] is a 33 y.o. female.  Pt is a 33 yo female with pmhx significant for interstitial cystitis, chronic pelvic pain, anxiety, migraines, htn, ptsd, adrenal insufficiency, s/p amp left hand due to [employer].  Pt presents to the ED today with left sided flank pain radiating into her bladder.  Bladder is very painful.  She's had n/v as well.  No fever.  She has an appt with Dr. Janit (urologist specializing in IC/chronic bladder pain), but not until Feb.         Home Medications Prior to Admission medications   Medication Sig Start Date End Date Taking? Authorizing Provider  acetaminophen  (TYLENOL ) 500 MG tablet Take 1,000 mg by mouth daily as needed for moderate pain, headache or fever.    [provider]  albuterol  (VENTOLIN  HFA) 108 (90 Base) MCG/ACT inhaler Inhale 2 puffs into the lungs every 4 (four) hours as needed for wheezing or shortness of breath. 10/15/22   Luke Orlan HERO, DO  amLODipine  (NORVASC ) 10 MG tablet Take 1 tablet (10 mg total) by mouth daily. 10/06/22   Elnora Ip, MD  clonazePAM  (KLONOPIN ) 1 MG tablet Take 1 tablet (1 mg total) by mouth 3 (three) times daily as needed for up to 11 days for anxiety. 06/19/22 07/01/22  Lemon Raisin, MD  EPINEPHrine  0.3 mg/0.3 mL IJ SOAJ injection Inject 0.3 mg into the muscle as needed for anaphylaxis. 01/15/21   Luke Orlan HERO, DO  famotidine  (PEPCID ) 20 MG tablet Take 1 tablet (20 mg total) by mouth 2 (two) times daily. 12/04/21   Luke Orlan HERO, DO  FLUoxetine  (PROZAC ) 40 MG capsule Take 1 capsule (40 mg total) by mouth daily. IM program 06/19/22   Lemon Raisin, MD  hydrocortisone  (CORTEF ) 10 MG tablet Take 1 tablet (10 mg total) by mouth 2 (two) times daily. IM program 06/19/22   Lemon Raisin, MD   irbesartan -hydrochlorothiazide  (AVALIDE) 150-12.5 MG tablet Take 2 tablets by mouth in the morning and at bedtime. 10/06/22 10/06/23  Elnora Ip, MD  levothyroxine  (SYNTHROID ) 150 MCG tablet Take 1 tablet (150 mcg total) by mouth daily at 6 (six) AM. 10/06/22 10/06/23  Elnora Ip, MD  lidocaine  (LIDODERM ) 5 % Place 1 patch onto the skin daily. Remove & Discard patch within 12 hours or as directed by MD 05/05/22   Leontine Elbe, DO  Multiple Vitamin (MULTIVITAMIN) tablet Take 1 tablet by mouth daily.    [provider]  ondansetron  (ZOFRAN -ODT) 4 MG disintegrating tablet Take 1 tablet (4 mg total) by mouth every 8 (eight) hours as needed for nausea or vomiting. 12/29/22   Melvenia Motto, MD  oxyCODONE  (ROXICODONE ) 5 MG immediate release tablet Take 1 tablet (5 mg total) by mouth every 6 (six) hours as needed for severe pain (pain score 7-10). 01/25/23   Schutt, Marsa HERO, PA-C  oxyCODONE -acetaminophen  (PERCOCET/ROXICET) 5-325 MG tablet Take 1 tablet by mouth every 6 (six) hours as needed for severe pain (pain score 7-10). 01/02/23   Ruthell Lonni FALCON, PA-C  pantoprazole  (PROTONIX ) 40 MG tablet Take 1 tablet (40 mg total) by mouth 2 (two) times daily before a meal. 05/05/22   Leontine Elbe, DO  promethazine  (PHENERGAN ) 25 MG suppository Place 1 suppository (25 mg total) rectally every 6 (six) hours  as needed for nausea or vomiting. 01/02/23   Ruthell Lonni FALCON, PA-C  SYMBICORT  80-4.5 MCG/ACT inhaler Inhale 2 puffs into the lungs 2 (two) times daily as needed (shortness of breath).    [provider]      Allergies    Bee venom, Coreg  [carvedilol ], Red dye #40 (allura red), Compazine  [prochlorperazine  maleate], Oxycodone , Vicodin [hydrocodone -acetaminophen ], Cymbalta [duloxetine hcl], Ditropan  [oxybutynin ], Elavil [amitriptyline], Motrin  [ibuprofen ], Naprosyn  [naproxen ], Pyrimethamine, Sulfadoxine, Zestril  [lisinopril ], Nifedical xl [nifedipine ], Reglan   [metoclopramide ], and Trandate  [labetalol ]    Review of Systems   Review of Systems  Gastrointestinal:  Positive for abdominal pain.  All other systems reviewed and are negative.   Physical Exam Updated Vital Signs BP (!) 178/109   Pulse 71   Temp (!) 97.3 F (36.3 C) (Oral)   Resp 18   Ht 5' 11 (1.803 m)   Wt 101.2 kg   SpO2 99%   BMI 31.10 kg/m  Physical Exam Vitals and nursing note reviewed.  Constitutional:      Appearance: Normal appearance.  HENT:     Head: Normocephalic and atraumatic.     Right Ear: External ear normal.     Left Ear: External ear normal.     Nose: Nose normal.     Mouth/Throat:     Mouth: Mucous membranes are moist.     Pharynx: Oropharynx is clear.  Eyes:     Extraocular Movements: Extraocular movements intact.     Conjunctiva/sclera: Conjunctivae normal.     Pupils: Pupils are equal, round, and reactive to light.  Cardiovascular:     Rate and Rhythm: Normal rate and regular rhythm.     Pulses: Normal pulses.     Heart sounds: Normal heart sounds.  Pulmonary:     Effort: Pulmonary effort is normal.     Breath sounds: Normal breath sounds.  Abdominal:     General: Abdomen is flat. Bowel sounds are normal.     Palpations: Abdomen is soft.     Tenderness: There is abdominal tenderness in the suprapubic area.  Musculoskeletal:        General: Normal range of motion.     Cervical back: Normal range of motion and neck supple.     Comments: S/p left hand amputation  Skin:    General: Skin is warm.     Capillary Refill: Capillary refill takes less than 2 seconds.  Neurological:     General: No focal deficit present.     Mental Status: She is alert and oriented to person, place, and time.  Psychiatric:        Mood and Affect: Mood is anxious and depressed. Affect is tearful.     ED Results / Procedures / Treatments   Labs (all labs ordered are listed, but only abnormal results are displayed) Labs Reviewed  URINALYSIS, ROUTINE W  REFLEX MICROSCOPIC - Abnormal; Notable for the following components:      Result Value   Color, Urine STRAW (*)    All other components within normal limits  BASIC METABOLIC PANEL - Abnormal; Notable for the following components:   Sodium 134 (*)    Glucose, Bld 105 (*)    All other components within normal limits  HCG, SERUM, QUALITATIVE  CBC    EKG None  Radiology CT Renal Stone Study Result Date: 03/09/2023 CLINICAL DATA:  Pelvic pain and dysuria for the past 3 days. Lower abdominal pain with nausea and vomiting for the past day. EXAM: CT ABDOMEN AND PELVIS WITHOUT  CONTRAST TECHNIQUE: Multidetector CT imaging of the abdomen and pelvis was performed following the standard protocol without IV contrast. RADIATION DOSE REDUCTION: This exam was performed according to the departmental dose-optimization program which includes automated exposure control, adjustment of the mA and/or kV according to patient size and/or use of iterative reconstruction technique. COMPARISON:  CT abdomen pelvis dated January 25, 2023. FINDINGS: Lower chest: No acute abnormality. Hepatobiliary: No focal liver abnormality is seen. Status post cholecystectomy. No biliary dilatation. Pancreas: Unremarkable. No pancreatic ductal dilatation or surrounding inflammatory changes. Spleen: Normal in size. Unchanged lobulated contour. No focal abnormality. Adrenals/Urinary Tract: Adrenal glands are unremarkable. Kidneys are normal, without renal calculi, focal lesion, or hydronephrosis. Bladder is mostly decompressed. Stomach/Bowel: Stomach is within normal limits. History of appendectomy. No evidence of bowel wall thickening, distention, or inflammatory changes. Vascular/Lymphatic: No significant vascular findings are present. No enlarged abdominal or pelvic lymph nodes. Reproductive: Uterus and bilateral adnexa are unremarkable. Other: No abdominal wall hernia or abnormality. Trace free fluid in the pelvis, likely physiologic. No  pneumoperitoneum. Musculoskeletal: No acute or significant osseous findings. IMPRESSION: 1. No acute intra-abdominal process. No urolithiasis. Electronically Signed   By: Elsie ONEIDA Shoulder M.D.   On: 03/09/2023 14:56    Procedures Procedures    Medications Ordered in ED Medications  ondansetron  (ZOFRAN -ODT) disintegrating tablet 4 mg (4 mg Oral Given 03/09/23 1147)  oxyCODONE -acetaminophen  (PERCOCET/ROXICET) 5-325 MG per tablet 1 tablet (1 tablet Oral Given 03/09/23 1229)  sodium chloride  0.9 % bolus 1,000 mL (1,000 mLs Intravenous New Bag/Given 03/09/23 1334)  morphine  (PF) 4 MG/ML injection 4 mg (4 mg Intravenous Given 03/09/23 1335)  ondansetron  (ZOFRAN ) injection 4 mg (4 mg Intravenous Given 03/09/23 1333)  HYDROmorphone  (DILAUDID ) injection 1 mg (1 mg Intravenous Given 03/09/23 1420)  HYDROmorphone  (DILAUDID ) injection 1 mg (1 mg Intravenous Given 03/09/23 1520)  LORazepam  (ATIVAN ) injection 1 mg (1 mg Intravenous Given 03/09/23 1518)  ketorolac  (TORADOL ) 30 MG/ML injection 30 mg (30 mg Intravenous Given 03/09/23 1522)  diphenhydrAMINE  (BENADRYL ) injection 12.5 mg (12.5 mg Intravenous Given 03/09/23 1624)  droperidol  (INAPSINE ) 2.5 MG/ML injection 1.25 mg (1.25 mg Intravenous Given 03/09/23 1623)    ED Course/ Medical Decision Making/ A&P                                 Medical Decision Making Amount and/or Complexity of Data Reviewed Labs: ordered. Radiology: ordered.  Risk Prescription drug management.   This patient presents to the ED for concern of flank and bladder pain, this involves an extensive number of treatment options, and is a complaint that carries with it a high risk of complications and morbidity.  The differential diagnosis includes ic flare, kidney stone, pyelo, uti, pregnancy   Co morbidities that complicate the patient evaluation   interstitial cystitis, chronic pelvic pain, anxiety, migraines, htn, ptsd, adrenal insufficiency, s/p amp left hand due to [employer]   Additional history obtained:  Additional history obtained from epic chart review  Lab Tests:  I Ordered, and personally interpreted labs.  The pertinent results include:  ua nl, bmp nl, preg neg, cbc nl   Imaging Studies ordered:  I ordered imaging studies including ct renal  I independently visualized and interpreted imaging which showed No acute intra-abdominal process. No urolithiasis.  I agree with the radiologist interpretation   Cardiac Monitoring:  The patient was maintained on a cardiac monitor.  I personally viewed and interpreted the cardiac monitored  which showed an underlying rhythm of: nsr   Medicines ordered and prescription drug management:  I ordered medication including morphine /dilaudid /ativan /toradol   for sx  Reevaluation of the patient after these medicines showed that the patient improved I have reviewed the patients home medicines and have made adjustments as needed   Test Considered:  ct   Critical Interventions:  Pain control  Problem List / ED Course:  Chronic pelvic pain:  labs and ct nl. Pt still with pain and n/v.  Additional meds ordered.    Reevaluation:  After the interventions noted above, I reevaluated the patient and found that they have :improved   Social Determinants of Health:  Lives at home   Dispostion: Pending at shift change          Final Clinical Impression(s) / ED Diagnoses Final diagnoses:  Chronic pelvic pain in female    Rx / DC Orders ED Discharge Orders     None         Dean Clarity, MD 03/09/23 [phone]

## 2023-03-09 NOTE — ED Triage Notes (Signed)
 Patient reports pelvic pain and dysuria x 3 days and lower back pain, nausea, and vomiting x 1 day. Denies fevers.

## 2023-03-12 ENCOUNTER — Other Ambulatory Visit: Payer: Self-pay

## 2023-03-12 ENCOUNTER — Emergency Department (HOSPITAL_COMMUNITY): Payer: Self-pay

## 2023-03-12 ENCOUNTER — Encounter (HOSPITAL_COMMUNITY): Payer: Self-pay

## 2023-03-12 ENCOUNTER — Inpatient Hospital Stay (HOSPITAL_COMMUNITY)
Admission: EM | Admit: 2023-03-12 | Discharge: 2023-03-21 | DRG: 690 | Disposition: A | Discharge status: Home / Self Care | Payer: Self-pay | Attending: Internal Medicine | Admitting: Internal Medicine

## 2023-03-12 DIAGNOSIS — R197 Diarrhea, unspecified: Secondary | ICD-10-CM | POA: Diagnosis not present

## 2023-03-12 DIAGNOSIS — Z555 Less than a high school diploma: Secondary | ICD-10-CM

## 2023-03-12 DIAGNOSIS — I1A0 Resistant hypertension: Secondary | ICD-10-CM | POA: Diagnosis present

## 2023-03-12 DIAGNOSIS — M797 Fibromyalgia: Secondary | ICD-10-CM | POA: Diagnosis present

## 2023-03-12 DIAGNOSIS — J4541 Moderate persistent asthma with (acute) exacerbation: Secondary | ICD-10-CM | POA: Diagnosis present

## 2023-03-12 DIAGNOSIS — R3982 Chronic bladder pain: Secondary | ICD-10-CM | POA: Diagnosis present

## 2023-03-12 DIAGNOSIS — Z9151 Personal history of suicidal behavior: Secondary | ICD-10-CM

## 2023-03-12 DIAGNOSIS — Z87440 Personal history of urinary (tract) infections: Secondary | ICD-10-CM

## 2023-03-12 DIAGNOSIS — F603 Borderline personality disorder: Secondary | ICD-10-CM | POA: Diagnosis present

## 2023-03-12 DIAGNOSIS — Z888 Allergy status to other drugs, medicaments and biological substances status: Secondary | ICD-10-CM

## 2023-03-12 DIAGNOSIS — G8929 Other chronic pain: Secondary | ICD-10-CM | POA: Diagnosis present

## 2023-03-12 DIAGNOSIS — Z8249 Family history of ischemic heart disease and other diseases of the circulatory system: Secondary | ICD-10-CM

## 2023-03-12 DIAGNOSIS — T402X5A Adverse effect of other opioids, initial encounter: Secondary | ICD-10-CM | POA: Diagnosis not present

## 2023-03-12 DIAGNOSIS — D751 Secondary polycythemia: Secondary | ICD-10-CM | POA: Diagnosis present

## 2023-03-12 DIAGNOSIS — R112 Nausea with vomiting, unspecified: Secondary | ICD-10-CM | POA: Diagnosis present

## 2023-03-12 DIAGNOSIS — K5900 Constipation, unspecified: Secondary | ICD-10-CM | POA: Diagnosis not present

## 2023-03-12 DIAGNOSIS — Z9049 Acquired absence of other specified parts of digestive tract: Secondary | ICD-10-CM

## 2023-03-12 DIAGNOSIS — F4310 Post-traumatic stress disorder, unspecified: Secondary | ICD-10-CM | POA: Diagnosis present

## 2023-03-12 DIAGNOSIS — L299 Pruritus, unspecified: Secondary | ICD-10-CM | POA: Diagnosis not present

## 2023-03-12 DIAGNOSIS — Z79890 Hormone replacement therapy (postmenopausal): Secondary | ICD-10-CM

## 2023-03-12 DIAGNOSIS — Z882 Allergy status to sulfonamides status: Secondary | ICD-10-CM

## 2023-03-12 DIAGNOSIS — N3010 Interstitial cystitis (chronic) without hematuria: Principal | ICD-10-CM | POA: Diagnosis present

## 2023-03-12 DIAGNOSIS — E669 Obesity, unspecified: Secondary | ICD-10-CM | POA: Diagnosis present

## 2023-03-12 DIAGNOSIS — Z885 Allergy status to narcotic agent status: Secondary | ICD-10-CM

## 2023-03-12 DIAGNOSIS — Z9109 Other allergy status, other than to drugs and biological substances: Secondary | ICD-10-CM

## 2023-03-12 DIAGNOSIS — E063 Autoimmune thyroiditis: Secondary | ICD-10-CM | POA: Diagnosis present

## 2023-03-12 DIAGNOSIS — Z87442 Personal history of urinary calculi: Secondary | ICD-10-CM

## 2023-03-12 DIAGNOSIS — F419 Anxiety disorder, unspecified: Secondary | ICD-10-CM | POA: Diagnosis present

## 2023-03-12 DIAGNOSIS — F332 Major depressive disorder, recurrent severe without psychotic features: Secondary | ICD-10-CM | POA: Diagnosis present

## 2023-03-12 DIAGNOSIS — Z9102 Food additives allergy status: Secondary | ICD-10-CM

## 2023-03-12 DIAGNOSIS — Z79899 Other long term (current) drug therapy: Secondary | ICD-10-CM

## 2023-03-12 DIAGNOSIS — R102 Pelvic and perineal pain: Principal | ICD-10-CM

## 2023-03-12 DIAGNOSIS — Z91041 Radiographic dye allergy status: Secondary | ICD-10-CM

## 2023-03-12 DIAGNOSIS — I10 Essential (primary) hypertension: Secondary | ICD-10-CM | POA: Diagnosis present

## 2023-03-12 DIAGNOSIS — Z886 Allergy status to analgesic agent status: Secondary | ICD-10-CM

## 2023-03-12 DIAGNOSIS — Z6831 Body mass index (BMI) 31.0-31.9, adult: Secondary | ICD-10-CM

## 2023-03-12 DIAGNOSIS — E2740 Unspecified adrenocortical insufficiency: Secondary | ICD-10-CM | POA: Diagnosis present

## 2023-03-12 DIAGNOSIS — Z91030 Bee allergy status: Secondary | ICD-10-CM

## 2023-03-12 LAB — CBC WITH DIFFERENTIAL/PLATELET
Abs Immature Granulocytes: 0.02 10*3/uL (ref 0.00–0.07)
Basophils Absolute: 0 10*3/uL (ref 0.0–0.1)
Basophils Relative: 1 %
Eosinophils Absolute: 0.2 10*3/uL (ref 0.0–0.5)
Eosinophils Relative: 3 %
HCT: 47.4 % — ABNORMAL HIGH (ref 36.0–46.0)
Hemoglobin: 15.7 g/dL — ABNORMAL HIGH (ref 12.0–15.0)
Immature Granulocytes: 0 %
Lymphocytes Relative: 30 %
Lymphs Abs: 2.1 10*3/uL (ref 0.7–4.0)
MCH: 30.9 pg (ref 26.0–34.0)
MCHC: 33.1 g/dL (ref 30.0–36.0)
MCV: 93.3 fL (ref 80.0–100.0)
Monocytes Absolute: 0.7 10*3/uL (ref 0.1–1.0)
Monocytes Relative: 9 %
Neutro Abs: 4.2 10*3/uL (ref 1.7–7.7)
Neutrophils Relative %: 57 %
Platelets: 285 10*3/uL (ref 150–400)
RBC: 5.08 MIL/uL (ref 3.87–5.11)
RDW: 12.6 % (ref 11.5–15.5)
WBC: 7.2 10*3/uL (ref 4.0–10.5)
nRBC: 0 % (ref 0.0–0.2)

## 2023-03-12 LAB — URINALYSIS, ROUTINE W REFLEX MICROSCOPIC
Bacteria, UA: NONE SEEN
Bilirubin Urine: NEGATIVE
Glucose, UA: NEGATIVE mg/dL
Ketones, ur: 5 mg/dL — AB
Leukocytes,Ua: NEGATIVE
Nitrite: NEGATIVE
Protein, ur: NEGATIVE mg/dL
RBC / HPF: >50 RBC/hpf (ref 0–5)
Specific Gravity, Urine: 1.021 (ref 1.005–1.030)
pH: 6 (ref 5.0–8.0)

## 2023-03-12 LAB — COMPREHENSIVE METABOLIC PANEL
ALT: 23 U/L (ref 0–44)
AST: 24 U/L (ref 15–41)
Albumin: 5 g/dL (ref 3.5–5.0)
Alkaline Phosphatase: 48 U/L (ref 38–126)
Anion gap: 14 (ref 5–15)
BUN: 21 mg/dL — ABNORMAL HIGH (ref 6–20)
CO2: 22 mmol/L (ref 22–32)
Calcium: 9.8 mg/dL (ref 8.9–10.3)
Chloride: 103 mmol/L (ref 98–111)
Creatinine, Ser: 0.79 mg/dL (ref 0.44–1.00)
GFR, Estimated: >60 mL/min (ref >60)
Glucose, Bld: 84 mg/dL (ref 70–99)
Potassium: 3.9 mmol/L (ref 3.5–5.1)
Sodium: 139 mmol/L (ref 135–145)
Total Bilirubin: 1.1 mg/dL (ref 0.0–1.2)
Total Protein: 8.1 g/dL (ref 6.5–8.1)

## 2023-03-12 LAB — LIPASE, BLOOD: Lipase: 26 U/L (ref 11–51)

## 2023-03-12 MED ORDER — DIPHENHYDRAMINE HCL 50 MG/ML IJ SOLN
25.0000 mg | Freq: Once | INTRAMUSCULAR | Status: AC
Start: 2023-03-12 — End: 2023-03-12
  Administered 2023-03-12: 25 mg via INTRAVENOUS
  Filled 2023-03-12: qty 1

## 2023-03-12 MED ORDER — LORAZEPAM 2 MG/ML IJ SOLN
0.5000 mg | Freq: Once | INTRAMUSCULAR | Status: AC
  Administered 2023-03-12: 0.5 mg via INTRAVENOUS
  Filled 2023-03-12: qty 1

## 2023-03-12 MED ORDER — MORPHINE SULFATE (PF) 4 MG/ML IV SOLN
8.0000 mg | Freq: Once | INTRAVENOUS | Status: AC
  Administered 2023-03-12: 8 mg via INTRAVENOUS
  Filled 2023-03-12: qty 2

## 2023-03-12 MED ORDER — SODIUM CHLORIDE 0.9 % IV BOLUS
2000.0000 mL | Freq: Once | INTRAVENOUS | Status: AC
  Administered 2023-03-12: 2000 mL via INTRAVENOUS

## 2023-03-12 MED ORDER — HYDROMORPHONE HCL 1 MG/ML IJ SOLN
1.0000 mg | Freq: Once | INTRAMUSCULAR | Status: AC
  Administered 2023-03-12: 1 mg via INTRAVENOUS
  Filled 2023-03-12: qty 1

## 2023-03-12 MED ORDER — SODIUM CHLORIDE 0.9 % IV SOLN
12.5000 mg | Freq: Once | INTRAVENOUS | Status: AC
  Administered 2023-03-12: 12.5 mg via INTRAVENOUS
  Filled 2023-03-12: qty 12.5

## 2023-03-12 MED ORDER — SODIUM CHLORIDE 0.9 % IV SOLN
INTRAVENOUS | Status: DC
Start: 2023-03-12 — End: 2023-03-13

## 2023-03-12 MED ORDER — SODIUM CHLORIDE 0.9 % IV SOLN
25.0000 mg | Freq: Four times a day (QID) | INTRAVENOUS | Status: DC | PRN
  Administered 2023-03-12: 25 mg via INTRAVENOUS
  Filled 2023-03-12: qty 25

## 2023-03-12 MED ORDER — DIPHENHYDRAMINE HCL 50 MG/ML IJ SOLN
12.5000 mg | Freq: Once | INTRAMUSCULAR | Status: AC
  Administered 2023-03-12: 12.5 mg via INTRAVENOUS
  Filled 2023-03-12: qty 1

## 2023-03-12 MED ORDER — HALOPERIDOL LACTATE 5 MG/ML IJ SOLN
2.0000 mg | Freq: Once | INTRAMUSCULAR | Status: DC
  Filled 2023-03-12: qty 1

## 2023-03-12 NOTE — ED Notes (Signed)
Difficult to stick.

## 2023-03-12 NOTE — ED Provider Notes (Signed)
[place] EMERGENCY DEPARTMENT AT [facility] Provider Note   CSN: [id number] Arrival date & time: 03/12/23  1125     History  Chief Complaint  Patient presents with   Abdominal Pain    Krystal [name] is a 33 y.o. female.  33 year old female presents with worsening chronic pelvic pain.  Patient seen here recently for same.  Has history of interstitial cystitis.  States she is also had sudden onset of vaginal bleeding.  Has had nausea and vomiting without fever or chills.  Denies any flank pain.  Symptoms unresponsive to her home medication.       Home Medications Prior to Admission medications   Medication Sig Start Date End Date Taking? Authorizing Provider  acetaminophen (TYLENOL) 500 MG tablet Take 1,000 mg by mouth daily as needed for moderate pain, headache or fever.    [provider]  albuterol (VENTOLIN HFA) 108 (90 Base) MCG/ACT inhaler Inhale 2 puffs into the lungs every 4 (four) hours as needed for wheezing or shortness of breath. 10/15/22   Ellamae Sia, DO  amLODipine (NORVASC) 10 MG tablet Take 1 tablet (10 mg total) by mouth daily. 10/06/22   Morene Crocker, MD  clonazePAM (KLONOPIN) 1 MG tablet Take 1 tablet (1 mg total) by mouth 3 (three) times daily as needed for up to 11 days for anxiety. 06/19/22 07/01/22  Quincy Simmonds, MD  EPINEPHrine 0.3 mg/0.3 mL IJ SOAJ injection Inject 0.3 mg into the muscle as needed for anaphylaxis. 01/15/21   Ellamae Sia, DO  famotidine (PEPCID) 20 MG tablet Take 1 tablet (20 mg total) by mouth 2 (two) times daily. 12/04/21   Ellamae Sia, DO  FLUoxetine (PROZAC) 40 MG capsule Take 1 capsule (40 mg total) by mouth daily. IM program 06/19/22   Quincy Simmonds, MD  hydrocortisone (CORTEF) 10 MG tablet Take 1 tablet (10 mg total) by mouth 2 (two) times daily. IM program 06/19/22   Quincy Simmonds, MD  irbesartan-hydrochlorothiazide (AVALIDE) 150-12.5 MG tablet Take 2 tablets by mouth in the morning and at bedtime.  10/06/22 10/06/23  Morene Crocker, MD  levothyroxine (SYNTHROID) 150 MCG tablet Take 1 tablet (150 mcg total) by mouth daily at 6 (six) AM. 10/06/22 10/06/23  Morene Crocker, MD  lidocaine (LIDODERM) 5 % Place 1 patch onto the skin daily. Remove & Discard patch within 12 hours or as directed by MD 05/05/22   Doran Stabler, DO  Multiple Vitamin (MULTIVITAMIN) tablet Take 1 tablet by mouth daily.    [provider]  ondansetron (ZOFRAN) 4 MG tablet Take 1 tablet (4 mg total) by mouth every 8 (eight) hours as needed for up to 12 doses for nausea or vomiting. 03/09/23   Terald Sleeper, MD  ondansetron (ZOFRAN-ODT) 4 MG disintegrating tablet Take 1 tablet (4 mg total) by mouth every 8 (eight) hours as needed for nausea or vomiting. 12/29/22   Gloris Manchester, MD  oxyCODONE (ROXICODONE) 5 MG immediate release tablet Take 1 tablet (5 mg total) by mouth every 6 (six) hours as needed for severe pain (pain score 7-10). 01/25/23   Schutt, Edsel Petrin, PA-C  oxyCODONE (ROXICODONE) 5 MG immediate release tablet Take 2 tablets (10 mg total) by mouth every 6 (six) hours as needed for up to 12 doses for severe pain (pain score 7-10). 03/09/23   Terald Sleeper, MD  oxyCODONE-acetaminophen (PERCOCET/ROXICET) 5-325 MG tablet Take 1 tablet by mouth every 6 (six) hours as needed for severe pain (pain score 7-10).  01/02/23   Al Decant, PA-C  pantoprazole (PROTONIX) 40 MG tablet Take 1 tablet (40 mg total) by mouth 2 (two) times daily before a meal. 05/05/22   Doran Stabler, DO  promethazine (PHENERGAN) 25 MG suppository Place 1 suppository (25 mg total) rectally every 6 (six) hours as needed for nausea or vomiting. 01/02/23   Al Decant, PA-C  SYMBICORT 80-4.5 MCG/ACT inhaler Inhale 2 puffs into the lungs 2 (two) times daily as needed (shortness of breath).    [provider]      Allergies    Bee venom, Coreg [carvedilol], Red dye #40 (allura red), Compazine [prochlorperazine  maleate], Oxycodone, Vicodin [hydrocodone-acetaminophen], Cymbalta [duloxetine hcl], Ditropan [oxybutynin], Elavil [amitriptyline], Motrin [ibuprofen], Naprosyn [naproxen], Pyrimethamine, Sulfadoxine, Zestril [lisinopril], Nifedical xl [nifedipine], Reglan [metoclopramide], and Trandate [labetalol]    Review of Systems   Review of Systems  All other systems reviewed and are negative.   Physical Exam Updated Vital Signs BP (!) 173/119 (BP Location: Right Arm)   Pulse 74   Temp 98.2 F (36.8 C) (Oral)   Resp 16   SpO2 98%  Physical Exam Vitals and nursing note reviewed.  Constitutional:      General: She is not in acute distress.    Appearance: Normal appearance. She is well-developed. She is not toxic-appearing.  HENT:     Head: Normocephalic and atraumatic.  Eyes:     General: Lids are normal.     Conjunctiva/sclera: Conjunctivae normal.     Pupils: Pupils are equal, round, and reactive to light.  Neck:     Thyroid: No thyroid mass.     Trachea: No tracheal deviation.  Cardiovascular:     Rate and Rhythm: Normal rate and regular rhythm.     Heart sounds: Normal heart sounds. No murmur heard.    No gallop.  Pulmonary:     Effort: Pulmonary effort is normal. No respiratory distress.     Breath sounds: Normal breath sounds. No stridor. No decreased breath sounds, wheezing, rhonchi or rales.  Abdominal:     General: There is no distension.     Palpations: Abdomen is soft.     Tenderness: There is abdominal tenderness in the suprapubic area and left lower quadrant. There is no rebound.    Musculoskeletal:        General: No tenderness. Normal range of motion.     Cervical back: Normal range of motion and neck supple.  Skin:    General: Skin is warm and dry.     Findings: No abrasion or rash.  Neurological:     Mental Status: She is alert and oriented to person, place, and time. Mental status is at baseline.     GCS: GCS eye subscore is 4. GCS verbal subscore is 5. GCS  motor subscore is 6.     Cranial Nerves: No cranial nerve deficit.     Sensory: No sensory deficit.     Motor: Motor function is intact.  Psychiatric:        Attention and Perception: Attention normal.        Speech: Speech normal.        Behavior: Behavior normal.     ED Results / Procedures / Treatments   Labs (all labs ordered are listed, but only abnormal results are displayed) Labs Reviewed  COMPREHENSIVE METABOLIC PANEL - Abnormal; Notable for the following components:      Result Value   BUN 21 (*)    All other components within  normal limits  URINALYSIS, ROUTINE W REFLEX MICROSCOPIC - Abnormal; Notable for the following components:   Hgb urine dipstick MODERATE (*)    Ketones, ur 5 (*)    All other components within normal limits  CBC WITH DIFFERENTIAL/PLATELET - Abnormal; Notable for the following components:   Hemoglobin 15.7 (*)    HCT 47.4 (*)    All other components within normal limits  LIPASE, BLOOD  CBC WITH DIFFERENTIAL/PLATELET  HCG, SERUM, QUALITATIVE    EKG None  Radiology No results found.  Procedures Procedures    Medications Ordered in ED Medications  sodium chloride 0.9 % bolus 2,000 mL (has no administration in time range)  0.9 %  sodium chloride infusion (has no administration in time range)  promethazine (PHENERGAN) 25 mg in sodium chloride 0.9 % 50 mL IVPB (has no administration in time range)  diphenhydrAMINE (BENADRYL) injection 12.5 mg (has no administration in time range)  morphine (PF) 4 MG/ML injection 8 mg (has no administration in time range)  LORazepam (ATIVAN) injection 0.5 mg (has no administration in time range)    ED Course/ Medical Decision Making/ A&P                                 Medical Decision Making Amount and/or Complexity of Data Reviewed Labs: ordered. Radiology: ordered.  Risk Prescription drug management.   Patient with chronic lower abdominal discomfort.  No leukocytosis on CBC.  Low suspicion for  infectious process.  Does have severe pain to her left lower quadrant.  History of ovarian cyst before in that location.  Plan for possible torsion and pelvic ultrasound from interpretation showed no acute findings.  Medicated for pain x 2.  Has a known history of interstitial cystitis.  Sees a urologist, Dr. Logan Bores.  Patient very frustrated about her chronic pain conditions.  Seen here 3 days ago for similar symptoms.  Was instructed to follow-up with her pain management specialist of choice.  She has not done this yet.  Discussed this with her husband by phone.  Plan will be to discharge.  Will not prescribe analgesics at this time.  Was instructed to follow-up with a pain management specialist as well as follow-up with her urologist.  Patient agreeable to this        Final Clinical Impression(s) / ED Diagnoses Final diagnoses:  None    Rx / DC Orders ED Discharge Orders     None         Lorre Nick, MD 03/12/23 2034

## 2023-03-12 NOTE — ED Triage Notes (Addendum)
Pt reports with abdominal pain and nausea since Monday. Pt was seen Monday but states that she is not getting better.

## 2023-03-12 NOTE — ED Notes (Signed)
Pt threw up and provided pt with mouth wash to rinse her mouth out

## 2023-03-12 NOTE — ED Provider Notes (Signed)
Care assumed from Dr. Freida Busman.  Patient here with chronic pelvic pain, nausea and vomiting, history of interstitial cystitis.  Unable to keep down home medications.  Intractable vomiting here despite multiple medications.  Ultrasound reassuring.  CT scan 2 days ago reassuring.  Continues to have intractable nausea and vomiting.  She is requesting admission and feels like she cannot go home.  Will repeat CT scan.  Admission discussed with Dr. Antionette Char.   Glynn Octave, MD 03/13/23 0001

## 2023-03-12 NOTE — Discharge Instructions (Addendum)
[facility]  [place] funds mental health services for residents on medicaid or who are uninsured - this is also through [facility] and they share records with Dr. Mercy Riding and Dr. Gasper Sells. You might be eligible for a partial hospitalization program (focusing on therapy but able to be at home with your babies in the afternoons/evenings) through that facility.   This is at the [facility]  [address] [place], [place] [address] [phone]  They have walk-in outpt appointments on the second floor on weekday mornings (call to verify hours). These are first-come, first-serve, so try to get there early or you might get pushed to the next day.  On the first floor they have a 24/7 psychiatric urgent care for emergences and a unit equipped to take care of mild detox; they can also help link you to rehab facilities.

## 2023-03-13 ENCOUNTER — Observation Stay (HOSPITAL_COMMUNITY): Payer: Self-pay

## 2023-03-13 ENCOUNTER — Encounter (HOSPITAL_COMMUNITY): Payer: Self-pay | Admitting: Family Medicine

## 2023-03-13 DIAGNOSIS — Z885 Allergy status to narcotic agent status: Secondary | ICD-10-CM | POA: Diagnosis not present

## 2023-03-13 DIAGNOSIS — J4541 Moderate persistent asthma with (acute) exacerbation: Secondary | ICD-10-CM | POA: Diagnosis present

## 2023-03-13 DIAGNOSIS — K5900 Constipation, unspecified: Secondary | ICD-10-CM | POA: Diagnosis not present

## 2023-03-13 DIAGNOSIS — Z6831 Body mass index (BMI) 31.0-31.9, adult: Secondary | ICD-10-CM | POA: Diagnosis not present

## 2023-03-13 DIAGNOSIS — E2740 Unspecified adrenocortical insufficiency: Secondary | ICD-10-CM | POA: Diagnosis present

## 2023-03-13 DIAGNOSIS — Z79890 Hormone replacement therapy (postmenopausal): Secondary | ICD-10-CM | POA: Diagnosis not present

## 2023-03-13 DIAGNOSIS — Z79899 Other long term (current) drug therapy: Secondary | ICD-10-CM | POA: Diagnosis not present

## 2023-03-13 DIAGNOSIS — F332 Major depressive disorder, recurrent severe without psychotic features: Secondary | ICD-10-CM | POA: Diagnosis present

## 2023-03-13 DIAGNOSIS — I1A0 Resistant hypertension: Secondary | ICD-10-CM | POA: Diagnosis present

## 2023-03-13 DIAGNOSIS — F4310 Post-traumatic stress disorder, unspecified: Secondary | ICD-10-CM | POA: Diagnosis present

## 2023-03-13 DIAGNOSIS — F603 Borderline personality disorder: Secondary | ICD-10-CM | POA: Diagnosis present

## 2023-03-13 DIAGNOSIS — N3010 Interstitial cystitis (chronic) without hematuria: Secondary | ICD-10-CM | POA: Diagnosis present

## 2023-03-13 DIAGNOSIS — E669 Obesity, unspecified: Secondary | ICD-10-CM | POA: Diagnosis present

## 2023-03-13 DIAGNOSIS — M797 Fibromyalgia: Secondary | ICD-10-CM | POA: Diagnosis present

## 2023-03-13 DIAGNOSIS — E063 Autoimmune thyroiditis: Secondary | ICD-10-CM | POA: Diagnosis present

## 2023-03-13 DIAGNOSIS — F419 Anxiety disorder, unspecified: Secondary | ICD-10-CM | POA: Diagnosis present

## 2023-03-13 DIAGNOSIS — Z8249 Family history of ischemic heart disease and other diseases of the circulatory system: Secondary | ICD-10-CM | POA: Diagnosis not present

## 2023-03-13 DIAGNOSIS — G8929 Other chronic pain: Secondary | ICD-10-CM | POA: Diagnosis present

## 2023-03-13 DIAGNOSIS — R112 Nausea with vomiting, unspecified: Secondary | ICD-10-CM | POA: Diagnosis present

## 2023-03-13 DIAGNOSIS — Z9049 Acquired absence of other specified parts of digestive tract: Secondary | ICD-10-CM | POA: Diagnosis not present

## 2023-03-13 DIAGNOSIS — I10 Essential (primary) hypertension: Secondary | ICD-10-CM | POA: Diagnosis present

## 2023-03-13 DIAGNOSIS — D751 Secondary polycythemia: Secondary | ICD-10-CM | POA: Diagnosis present

## 2023-03-13 DIAGNOSIS — R102 Pelvic and perineal pain: Secondary | ICD-10-CM | POA: Diagnosis present

## 2023-03-13 DIAGNOSIS — L299 Pruritus, unspecified: Secondary | ICD-10-CM | POA: Diagnosis not present

## 2023-03-13 DIAGNOSIS — Z91041 Radiographic dye allergy status: Secondary | ICD-10-CM | POA: Diagnosis not present

## 2023-03-13 DIAGNOSIS — Z9151 Personal history of suicidal behavior: Secondary | ICD-10-CM | POA: Diagnosis not present

## 2023-03-13 LAB — RAPID URINE DRUG SCREEN, HOSP PERFORMED
Amphetamines: NOT DETECTED
Barbiturates: NOT DETECTED
Benzodiazepines: POSITIVE — AB
Cocaine: NOT DETECTED
Opiates: POSITIVE — AB
Tetrahydrocannabinol: POSITIVE — AB

## 2023-03-13 LAB — BASIC METABOLIC PANEL
Anion gap: 7 (ref 5–15)
BUN: 18 mg/dL (ref 6–20)
CO2: 24 mmol/L (ref 22–32)
Calcium: 8.5 mg/dL — ABNORMAL LOW (ref 8.9–10.3)
Chloride: 108 mmol/L (ref 98–111)
Creatinine, Ser: 0.87 mg/dL (ref 0.44–1.00)
GFR, Estimated: >60 mL/min (ref >60)
Glucose, Bld: 88 mg/dL (ref 70–99)
Potassium: 4 mmol/L (ref 3.5–5.1)
Sodium: 139 mmol/L (ref 135–145)

## 2023-03-13 LAB — CBC
HCT: 44.2 % (ref 36.0–46.0)
Hemoglobin: 14.5 g/dL (ref 12.0–15.0)
MCH: 31.5 pg (ref 26.0–34.0)
MCHC: 32.8 g/dL (ref 30.0–36.0)
MCV: 96.1 fL (ref 80.0–100.0)
Platelets: 323 10*3/uL (ref 150–400)
RBC: 4.6 MIL/uL (ref 3.87–5.11)
RDW: 12.7 % (ref 11.5–15.5)
WBC: 7.9 10*3/uL (ref 4.0–10.5)
nRBC: 0 % (ref 0.0–0.2)

## 2023-03-13 LAB — HCG, SERUM, QUALITATIVE: Preg, Serum: NEGATIVE

## 2023-03-13 MED ORDER — LORAZEPAM 2 MG/ML IJ SOLN
0.5000 mg | Freq: Once | INTRAMUSCULAR | Status: AC
  Administered 2023-03-13: 0.5 mg via INTRAVENOUS
  Filled 2023-03-13: qty 1

## 2023-03-13 MED ORDER — HYDROCORTISONE SOD SUC (PF) 100 MG IJ SOLR
50.0000 mg | Freq: Three times a day (TID) | INTRAMUSCULAR | Status: DC
  Administered 2023-03-13 – 2023-03-14 (×4): 50 mg via INTRAVENOUS
  Filled 2023-03-13 (×4): qty 2

## 2023-03-13 MED ORDER — FLUOXETINE HCL 20 MG PO CAPS
40.0000 mg | ORAL_CAPSULE | Freq: Every day | ORAL | Status: DC
  Administered 2023-03-13 – 2023-03-21 (×9): 40 mg via ORAL
  Filled 2023-03-13 (×9): qty 2

## 2023-03-13 MED ORDER — PANTOPRAZOLE SODIUM 40 MG IV SOLR
40.0000 mg | INTRAVENOUS | Status: DC
  Administered 2023-03-13 (×2): 40 mg via INTRAVENOUS
  Filled 2023-03-13 (×2): qty 10

## 2023-03-13 MED ORDER — ALBUTEROL SULFATE HFA 108 (90 BASE) MCG/ACT IN AERS: 2.0000 | INHALATION_SPRAY | RESPIRATORY_TRACT | Status: DC | PRN

## 2023-03-13 MED ORDER — HYDRALAZINE HCL 20 MG/ML IJ SOLN: 2.0000 mg | Freq: Four times a day (QID) | INTRAMUSCULAR | Status: DC | PRN

## 2023-03-13 MED ORDER — OXYBUTYNIN CHLORIDE ER 5 MG PO TB24
5.0000 mg | ORAL_TABLET | Freq: Every day | ORAL | Status: DC
  Administered 2023-03-13 – 2023-03-20 (×8): 5 mg via ORAL
  Filled 2023-03-13 (×7): qty 1

## 2023-03-13 MED ORDER — IOHEXOL 300 MG/ML  SOLN
100.0000 mL | Freq: Once | INTRAMUSCULAR | Status: AC | PRN
  Administered 2023-03-13: 100 mL via INTRAVENOUS

## 2023-03-13 MED ORDER — HYDROCORTISONE SOD SUC (PF) 100 MG IJ SOLR
25.0000 mg | Freq: Two times a day (BID) | INTRAMUSCULAR | Status: DC
  Administered 2023-03-13: 25 mg via INTRAVENOUS
  Filled 2023-03-13: qty 2

## 2023-03-13 MED ORDER — ENOXAPARIN SODIUM 60 MG/0.6ML IJ SOSY
50.0000 mg | PREFILLED_SYRINGE | INTRAMUSCULAR | Status: DC
  Administered 2023-03-13 – 2023-03-19 (×7): 50 mg via SUBCUTANEOUS
  Filled 2023-03-13 (×7): qty 0.6

## 2023-03-13 MED ORDER — ONDANSETRON HCL 4 MG/2ML IJ SOLN
4.0000 mg | Freq: Four times a day (QID) | INTRAMUSCULAR | Status: DC | PRN
  Administered 2023-03-13 – 2023-03-19 (×18): 4 mg via INTRAVENOUS
  Filled 2023-03-13 (×18): qty 2

## 2023-03-13 MED ORDER — LORATADINE 10 MG PO TABS
10.0000 mg | ORAL_TABLET | Freq: Once | ORAL | Status: AC
  Administered 2023-03-13: 10 mg via ORAL
  Filled 2023-03-13: qty 1

## 2023-03-13 MED ORDER — FLUTICASONE FUROATE-VILANTEROL 100-25 MCG/ACT IN AEPB
1.0000 | INHALATION_SPRAY | Freq: Every day | RESPIRATORY_TRACT | Status: DC
  Administered 2023-03-15 – 2023-03-21 (×7): 1 via RESPIRATORY_TRACT
  Filled 2023-03-13: qty 28

## 2023-03-13 MED ORDER — PROMETHAZINE (PHENERGAN) 6.25MG IN NS 50ML IVPB
6.2500 mg | Freq: Four times a day (QID) | INTRAVENOUS | Status: DC | PRN
  Administered 2023-03-13 – 2023-03-20 (×7): 6.25 mg via INTRAVENOUS
  Filled 2023-03-13 (×5): qty 6.25
  Filled 2023-03-13: qty 50
  Filled 2023-03-13 (×2): qty 6.25

## 2023-03-13 MED ORDER — KETOROLAC TROMETHAMINE 30 MG/ML IJ SOLN
30.0000 mg | Freq: Four times a day (QID) | INTRAMUSCULAR | Status: DC
  Administered 2023-03-13 – 2023-03-14 (×4): 30 mg via INTRAVENOUS
  Filled 2023-03-13 (×4): qty 1

## 2023-03-13 MED ORDER — OXYCODONE HCL 5 MG PO TABS
5.0000 mg | ORAL_TABLET | Freq: Four times a day (QID) | ORAL | Status: DC
  Administered 2023-03-13 – 2023-03-21 (×30): 5 mg via ORAL
  Filled 2023-03-13 (×30): qty 1

## 2023-03-13 MED ORDER — ACETAMINOPHEN 325 MG PO TABS
650.0000 mg | ORAL_TABLET | Freq: Four times a day (QID) | ORAL | Status: DC | PRN
  Administered 2023-03-13 – 2023-03-19 (×5): 650 mg via ORAL
  Filled 2023-03-13 (×6): qty 2

## 2023-03-13 MED ORDER — ACETAMINOPHEN 650 MG RE SUPP: 650.0000 mg | Freq: Four times a day (QID) | RECTAL | Status: DC | PRN

## 2023-03-13 MED ORDER — HYDROMORPHONE HCL 1 MG/ML IJ SOLN
0.5000 mg | INTRAMUSCULAR | Status: AC | PRN
Start: 2023-03-13 — End: 2023-03-13
  Administered 2023-03-13 (×2): 0.5 mg via INTRAVENOUS
  Filled 2023-03-13 (×2): qty 1

## 2023-03-13 MED ORDER — MORPHINE SULFATE (PF) 2 MG/ML IV SOLN
2.0000 mg | INTRAVENOUS | Status: DC | PRN
  Administered 2023-03-13: 2 mg via INTRAVENOUS
  Administered 2023-03-13 (×3): 4 mg via INTRAVENOUS
  Administered 2023-03-14 (×2): 2 mg via INTRAVENOUS
  Filled 2023-03-13 (×2): qty 2
  Filled 2023-03-13: qty 1
  Filled 2023-03-13: qty 2
  Filled 2023-03-13 (×2): qty 1

## 2023-03-13 MED ORDER — LORAZEPAM 2 MG/ML IJ SOLN
1.0000 mg | Freq: Three times a day (TID) | INTRAMUSCULAR | Status: DC | PRN
  Administered 2023-03-13 – 2023-03-17 (×10): 1 mg via INTRAVENOUS
  Filled 2023-03-13 (×10): qty 1

## 2023-03-13 MED ORDER — SODIUM CHLORIDE 0.9 % IV SOLN: INTRAVENOUS | Status: AC

## 2023-03-13 MED ORDER — ALBUTEROL SULFATE (2.5 MG/3ML) 0.083% IN NEBU: 2.5000 mg | INHALATION_SOLUTION | RESPIRATORY_TRACT | Status: DC | PRN

## 2023-03-13 MED ORDER — SODIUM CHLORIDE 0.9% FLUSH
3.0000 mL | Freq: Two times a day (BID) | INTRAVENOUS | Status: DC
  Administered 2023-03-13 – 2023-03-20 (×10): 3 mL via INTRAVENOUS

## 2023-03-13 MED ORDER — KETOROLAC TROMETHAMINE 15 MG/ML IJ SOLN
15.0000 mg | Freq: Four times a day (QID) | INTRAMUSCULAR | Status: DC | PRN
  Administered 2023-03-13 (×2): 15 mg via INTRAVENOUS
  Filled 2023-03-13 (×2): qty 1

## 2023-03-13 MED ORDER — ONDANSETRON HCL 4 MG PO TABS
4.0000 mg | ORAL_TABLET | Freq: Four times a day (QID) | ORAL | Status: DC | PRN
  Administered 2023-03-20: 4 mg via ORAL
  Filled 2023-03-13 (×2): qty 1

## 2023-03-13 NOTE — H&P (Signed)
History and Physical    Krystal [name]:[id number] DOB: [birth year] DOA: 03/12/2023  PCP: Patient, No Pcp Per   Patient coming from: Home   Chief Complaint: Lower abdominal/pelvic pain, N/V   HPI: Krystal [name] is a 33 y.o. female with medical history significant for depression, anxiety, fibromyalgia, interstitial cystitis, hypertension, and asthma who presents with lower abdominal/pelvic pain and nausea with vomiting.   Patient reports that her chronic bladder pain has been more severe for the past several days when she has also developed pain near the left flank that radiates around towards her bladder.  She has also gone on to develop nausea with recurrent bouts of nonbloody vomiting.  She was seen for this on 03/09/2023 and had negative pregnancy test, no evidence for UTI, and negative CT renal stone study.  Since then, pain, nausea, and vomiting have persisted and she has been unable to tolerate any oral medications.  There is no fever, chills, headache, or focal numbness or weakness.  ED Course: Upon arrival to the ED, patient is found to be afebrile and saturating well on room air with normal RR, normal HR, and elevated blood pressure.  EKG demonstrates sinus rhythm with normal QT interval.  Pelvic ultrasound is unremarkable.  Labs are notable for elevated BUN to creatinine ratio, normal lipase, normal WBC, mild erythrocytosis, no bacteriuria or pyuria on UA, and UDS positive for benzodiazepines, opiates, and THC.  Patient was treated with 2 doses of Benadryl, 2 doses of Phenergan, 2 L of saline, morphine, Ativan, and Dilaudid in the ED.  She continues to report nausea and uncontrolled vomiting.  CT of the abdomen and pelvis was ordered but not yet performed.  Review of Systems:  All other systems reviewed and apart from HPI, are negative.  Past Medical History:  Diagnosis Date   Acute acalculous cholecystitis s/p lap cholecystectomy 05/11/2020 05/11/2020   Angio-edema  01/15/2021   Anxiety    Asthma    Depression    HELLP (hemolytic anemia/elev liver enzymes/low platelets in pregnancy) 12/08/2017   Hemorrhagic cyst of left ovary 04/15/2022   History of borderline personality disorder    History of HELLP syndrome, currently pregnant 04/21/2019   Formatting of this note might be different from the original.  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   Severe uncontrolled hypertension 10/08/2017   UTI (urinary tract infection)     Past Surgical History:  Procedure Laterality Date   APPENDECTOMY     BIOPSY  05/10/2020   Procedure: BIOPSY;  Surgeon: Willis Modena, MD;  Location: [facility] ENDOSCOPY;  Service: Endoscopy;;   BIOPSY  05/02/2022   Procedure: BIOPSY;  Surgeon: Benancio Deeds, MD;  Location: [facility] ENDOSCOPY;  Service: Gastroenterology;;   CESAREAN SECTION N/A 12/06/2017   Procedure: CESAREAN SECTION;  Surgeon: Conan Bowens, MD;  Location: [facility] BIRTHING SUITES;  Service: Obstetrics;  Laterality: N/A;   CESAREAN SECTION  07/21/2021   Procedure: CESAREAN SECTION;  Surgeon: Reva Bores, MD;  Location: [facility] LD ORS;  Service: Obstetrics;;   CHOLECYSTECTOMY N/A 05/11/2020   Procedure: SINGLE SITE LAPAROSCOPIC CHOLECYSTECTOMY AND LIVER BIOSY;  Surgeon: Karie Soda, MD;  Location: [facility] ORS;  Service: General;  Laterality: N/A;   ESOPHAGOGASTRODUODENOSCOPY (EGD) WITH PROPOFOL N/A 05/10/2020   Procedure: ESOPHAGOGASTRODUODENOSCOPY (EGD) WITH PROPOFOL;  Surgeon: Willis Modena, MD;   Location: [facility] ENDOSCOPY;  Service: Endoscopy;  Laterality: N/A;   FLEXIBLE SIGMOIDOSCOPY N/A 05/02/2022   Procedure: FLEXIBLE SIGMOIDOSCOPY;  Surgeon: Benancio Deeds, MD;  Location: [facility] ENDOSCOPY;  Service: Gastroenterology;  Laterality: N/A;   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 She has never used smokeless tobacco. She reports current alcohol use. She reports that she does not use drugs.  Allergies  Allergen Reactions   Bee Venom Anaphylaxis and Swelling    Swelling of face, throat. Airway restriction.   Coreg [Carvedilol] Shortness Of Breath and Other (See Comments)    Triggers asthma   Red Dye #40 (Allura Red) Other (See Comments)    Migraines   Compazine [Prochlorperazine Maleate] Other (See Comments)    Twitching Able to tolerate promethazine   Oxycodone     Nausea/vomiting   Vicodin [Hydrocodone-Acetaminophen] Itching, Nausea And Vomiting and Other (See Comments)    OK with Tylenol   Cymbalta [Duloxetine Hcl] Other (See Comments)    Irritability  Syncope    Ditropan [Oxybutynin] Other (See Comments)    Severe dry mouth   Elavil [Amitriptyline] Other (See Comments)    Irritability    Motrin [Ibuprofen] Other (See Comments)    Heartburn    Naprosyn [Naproxen] Other (See Comments)    Heartburn    Pyrimethamine Other (See Comments)    From Fansidar combination drug Triggers asthma   Sulfadoxine Other (See Comments)    From Fansidar combination drug Triggers asthma     Zestril [Lisinopril] Cough   Nifedical Xl [Nifedipine] Nausea And Vomiting   Reglan [Metoclopramide] Swelling and Other (See Comments)    Restlessness Able to take with Benadryl   Trandate [Labetalol] Nausea And Vomiting and Other (See Comments)    Triggers asthma     Family History  Problem Relation Age of Onset   Hypertension Mother    Coronary artery disease Mother    Hypertension Father    Stroke Father    Atrial fibrillation Father    Heart failure Maternal Grandfather    Heart attack Maternal Grandfather      Prior to Admission medications   Medication Sig Start Date End Date Taking? Authorizing Provider  acetaminophen (TYLENOL) 500 MG tablet Take 1,000 mg by mouth daily as needed for moderate pain, headache or fever.    [provider]  albuterol (VENTOLIN HFA) 108 (90 Base) MCG/ACT inhaler Inhale 2 puffs into the lungs every 4 (four) hours as needed for wheezing or shortness of breath. 10/15/22   Ellamae Sia, DO  amLODipine (NORVASC) 10 MG tablet Take 1 tablet (  10 mg total) by mouth daily. 10/06/22   Morene Crocker, MD  clonazePAM (KLONOPIN) 1 MG tablet Take 1 tablet (1 mg total) by mouth 3 (three) times daily as needed for up to 11 days for anxiety. 06/19/22 07/01/22  Quincy Simmonds, MD  EPINEPHrine 0.3 mg/0.3 mL IJ SOAJ injection Inject 0.3 mg into the muscle as needed for anaphylaxis. 01/15/21   Ellamae Sia, DO  famotidine (PEPCID) 20 MG tablet Take 1 tablet (20 mg total) by mouth 2 (two) times daily. 12/04/21   Ellamae Sia, DO  FLUoxetine (PROZAC) 40 MG capsule Take 1 capsule (40 mg total) by mouth daily. IM program 06/19/22   Quincy Simmonds, MD  hydrocortisone (CORTEF) 10 MG tablet Take 1 tablet (10 mg total) by mouth 2 (two) times daily. IM program 06/19/22   Quincy Simmonds, MD  irbesartan-hydrochlorothiazide (AVALIDE) 150-12.5 MG tablet Take 2 tablets by mouth in the morning and at bedtime. 10/06/22 10/06/23  Morene Crocker, MD  levothyroxine  (SYNTHROID) 150 MCG tablet Take 1 tablet (150 mcg total) by mouth daily at 6 (six) AM. 10/06/22 10/06/23  Morene Crocker, MD  lidocaine (LIDODERM) 5 % Place 1 patch onto the skin daily. Remove & Discard patch within 12 hours or as directed by MD 05/05/22   Doran Stabler, DO  Multiple Vitamin (MULTIVITAMIN) tablet Take 1 tablet by mouth daily.    [provider]  ondansetron (ZOFRAN) 4 MG tablet Take 1 tablet (4 mg total) by mouth every 8 (eight) hours as needed for up to 12 doses for nausea or vomiting. 03/09/23   Terald Sleeper, MD  ondansetron (ZOFRAN-ODT) 4 MG disintegrating tablet Take 1 tablet (4 mg total) by mouth every 8 (eight) hours as needed for nausea or vomiting. 12/29/22   Gloris Manchester, MD  oxyCODONE (ROXICODONE) 5 MG immediate release tablet Take 1 tablet (5 mg total) by mouth every 6 (six) hours as needed for severe pain (pain score 7-10). 01/25/23   Schutt, Edsel Petrin, PA-C  oxyCODONE (ROXICODONE) 5 MG immediate release tablet Take 2 tablets (10 mg total) by mouth every 6 (six) hours as needed for up to 12 doses for severe pain (pain score 7-10). 03/09/23   Terald Sleeper, MD  oxyCODONE-acetaminophen (PERCOCET/ROXICET) 5-325 MG tablet Take 1 tablet by mouth every 6 (six) hours as needed for severe pain (pain score 7-10). 01/02/23   Al Decant, PA-C  pantoprazole (PROTONIX) 40 MG tablet Take 1 tablet (40 mg total) by mouth 2 (two) times daily before a meal. 05/05/22   Doran Stabler, DO  promethazine (PHENERGAN) 25 MG suppository Place 1 suppository (25 mg total) rectally every 6 (six) hours as needed for nausea or vomiting. 01/02/23   Al Decant, PA-C  SYMBICORT 80-4.5 MCG/ACT inhaler Inhale 2 puffs into the lungs 2 (two) times daily as needed (shortness of breath).    [provider]    Physical Exam: Vitals:   03/12/23 2100 03/12/23 2130 03/12/23 2226 03/12/23 2333  BP: (!) 182/87 (!) 150/104 (!) 153/98   Pulse: 88 66 75   Resp: 18  18    Temp:    98.3 F (36.8 C)  TempSrc:    Oral  SpO2: 100% 97% 97%     Constitutional: NAD, no pallor or diaphoresis  Eyes: PERTLA, lids and conjunctivae normal ENMT: Mucous membranes are moist. Posterior pharynx clear of any exudate or lesions.   Neck: supple, no masses  Respiratory: no wheezing, no crackles. No accessory muscle use.  Cardiovascular: S1 & S2 heard, regular rate and rhythm. No extremity edema.  Abdomen: No distension, soft. Bowel sounds active.  Musculoskeletal: no clubbing / cyanosis. S/p amputation distal LUE, gross deformity to distal RUE.   Skin: no significant rashes, lesions, ulcers. Warm, dry, well-perfused. Neurologic: CN 2-12 grossly intact. Moving all extremities. Alert and oriented.  Psychiatric: Crying. Cooperative.    Labs and Imaging on Admission: I have personally reviewed following labs and imaging studies  CBC: Recent Labs  Lab 03/09/23 1153 03/12/23 1610  WBC 7.3 7.2  NEUTROABS  --  4.2  HGB 14.1 15.7*  HCT 41.0 47.4*  MCV 94.5 93.3  PLT 326 285   Basic Metabolic Panel: Recent Labs  Lab 03/09/23 1153 03/12/23 1301  NA 134* 139  K 3.7 3.9  CL 103 103  CO2 24 22  GLUCOSE 105* 84  BUN 14 21*  CREATININE 0.78 0.79  CALCIUM 8.9 9.8   GFR: Estimated Creatinine Clearance: 129.8 mL/min (by C-G formula based on SCr of 0.79 mg/dL). Liver Function Tests: Recent Labs  Lab 03/12/23 1301  AST 24  ALT 23  ALKPHOS 48  BILITOT 1.1  PROT 8.1  ALBUMIN 5.0   Recent Labs  Lab 03/12/23 1301  LIPASE 26   No results for input(s): "AMMONIA" in the last 168 hours. Coagulation Profile: No results for input(s): "INR", "PROTIME" in the last 168 hours. Cardiac Enzymes: No results for input(s): "CKTOTAL", "CKMB", "CKMBINDEX", "TROPONINI" in the last 168 hours. BNP (last 3 results) No results for input(s): "PROBNP" in the last 8760 hours. HbA1C: No results for input(s): "HGBA1C" in the last 72 hours. CBG: No results for input(s): "GLUCAP"  in the last 168 hours. Lipid Profile: No results for input(s): "CHOL", "HDL", "LDLCALC", "TRIG", "CHOLHDL", "LDLDIRECT" in the last 72 hours. Thyroid Function Tests: No results for input(s): "TSH", "T4TOTAL", "FREET4", "T3FREE", "THYROIDAB" in the last 72 hours. Anemia Panel: No results for input(s): "VITAMINB12", "FOLATE", "FERRITIN", "TIBC", "IRON", "RETICCTPCT" in the last 72 hours. Urine analysis:    Component Value Date/Time   COLORURINE YELLOW 03/12/2023 1158   APPEARANCEUR CLEAR 03/12/2023 1158   APPEARANCEUR Clear 09/05/2021 1516   LABSPEC 1.021 03/12/2023 1158   PHURINE 6.0 03/12/2023 1158   GLUCOSEU NEGATIVE 03/12/2023 1158   HGBUR MODERATE (A) 03/12/2023 1158   BILIRUBINUR NEGATIVE 03/12/2023 1158   BILIRUBINUR Negative 09/05/2021 1516   KETONESUR 5 (A) 03/12/2023 1158   PROTEINUR NEGATIVE 03/12/2023 1158   NITRITE NEGATIVE 03/12/2023 1158   LEUKOCYTESUR NEGATIVE 03/12/2023 1158   Sepsis Labs: @LABRCNTIP (procalcitonin:4,lacticidven:4) )No results found for this or any previous visit (from the past 240 hours).   Radiological Exams on Admission: US Pelvis Complete Result Date: 03/12/2023 CLINICAL DATA:  left pelvic pain.  Last menstrual period 03/12/2023 EXAM: TRANSABDOMINAL AND TRANSVAGINAL ULTRASOUND OF PELVIS DOPPLER ULTRASOUND OF OVARIES TECHNIQUE: Both transabdominal and transvaginal ultrasound examinations of the pelvis were performed. Transabdominal technique was performed for global imaging of the pelvis including uterus, ovaries, adnexal regions, and pelvic cul-de-sac. It was necessary to proceed with endovaginal exam following the transabdominal exam to visualize the uterus and bilateral ovaries. Color and duplex Doppler ultrasound was utilized to evaluate blood flow to the ovaries. COMPARISON:  None Available. FINDINGS: Uterus Measurements: 7.3 x 4 x 4.9 cm = volume: 73.8 mL. No fibroids or other mass visualized. Endometrium Thickness: 4 mm.  No focal abnormality  visualized. Right ovary Measurements: 2 x 1.5 x 1.6 cm = volume: 2.5 mL. Normal appearance/no adnexal mass. Left ovary Measurements: 1.5  x 1.4 x 1.6 cm = volume: 1.7 mL. Normal appearance/no adnexal mass. Pulsed Doppler evaluation of both ovaries demonstrates normal low-resistance arterial and venous waveforms. Other findings No abnormal free fluid. IMPRESSION: Unremarkable pelvic ultrasound. Electronically Signed   By: Tish Frederickson M.D.   On: 03/12/2023 18:28   US Transvaginal Non-OB Result Date: 03/12/2023 CLINICAL DATA:  left pelvic pain.  Last menstrual period 03/12/2023 EXAM: TRANSABDOMINAL AND TRANSVAGINAL ULTRASOUND OF PELVIS DOPPLER ULTRASOUND OF OVARIES TECHNIQUE: Both transabdominal and transvaginal ultrasound examinations of the pelvis were performed. Transabdominal technique was performed for global imaging of the pelvis including uterus, ovaries, adnexal regions, and pelvic cul-de-sac. It was necessary to proceed with endovaginal exam following the transabdominal exam to visualize the uterus and bilateral ovaries. Color and duplex Doppler ultrasound was utilized to evaluate blood flow to the ovaries. COMPARISON:  None Available. FINDINGS: Uterus Measurements: 7.3 x 4 x 4.9 cm = volume: 73.8 mL. No fibroids or other mass visualized. Endometrium Thickness: 4 mm.  No focal abnormality visualized. Right ovary Measurements: 2 x 1.5 x 1.6 cm = volume: 2.5 mL. Normal appearance/no adnexal mass. Left ovary Measurements: 1.5 x 1.4 x 1.6 cm = volume: 1.7 mL. Normal appearance/no adnexal mass. Pulsed Doppler evaluation of both ovaries demonstrates normal low-resistance arterial and venous waveforms. Other findings No abnormal free fluid. IMPRESSION: Unremarkable pelvic ultrasound. Electronically Signed   By: Tish Frederickson M.D.   On: 03/12/2023 18:28   [place] Art/Ven Flow Abd Pelv Doppler Result Date: 03/12/2023 CLINICAL DATA:  left pelvic pain.  Last menstrual period 03/12/2023 EXAM: TRANSABDOMINAL AND  TRANSVAGINAL ULTRASOUND OF PELVIS DOPPLER ULTRASOUND OF OVARIES TECHNIQUE: Both transabdominal and transvaginal ultrasound examinations of the pelvis were performed. Transabdominal technique was performed for global imaging of the pelvis including uterus, ovaries, adnexal regions, and pelvic cul-de-sac. It was necessary to proceed with endovaginal exam following the transabdominal exam to visualize the uterus and bilateral ovaries. Color and duplex Doppler ultrasound was utilized to evaluate blood flow to the ovaries. COMPARISON:  None Available. FINDINGS: Uterus Measurements: 7.3 x 4 x 4.9 cm = volume: 73.8 mL. No fibroids or other mass visualized. Endometrium Thickness: 4 mm.  No focal abnormality visualized. Right ovary Measurements: 2 x 1.5 x 1.6 cm = volume: 2.5 mL. Normal appearance/no adnexal mass. Left ovary Measurements: 1.5 x 1.4 x 1.6 cm = volume: 1.7 mL. Normal appearance/no adnexal mass. Pulsed Doppler evaluation of both ovaries demonstrates normal low-resistance arterial and venous waveforms. Other findings No abnormal free fluid. IMPRESSION: Unremarkable pelvic ultrasound. Electronically Signed   By: Tish Frederickson M.D.   On: 03/12/2023 18:28    EKG: Independently reviewed. Sinus rhythm.   Assessment/Plan   1. Intractable N/V  - Abdominal exam benign, no infectious s/s, UDS notable for +THC  - CT is ordered but not yet performed  - Continue IVF hydration, antiemetics, monitor electrolytes, follow-up on CT findings    2. Abdominal and pelvic pain  - Chronic problem that has worsened recently   - Pelvic US in unremarkable in ED, there is no infectious s/s, and labs reassuring  - CT is being repeated in ED, barring any acute findings, plan to continue supportive care, recommend outpatient pain-management and urology follow-up    3. Adrenal insufficiency  - Not in crisis, use IV steroid for now    4. Hypothyroidism  - Use IV levothyroxine if still unable to tolerate oral medications  in a few days    5. Depression, anxiety  - Resume home medications  as able    6. Hypertension  - Treat as-needed only for now    DVT prophylaxis: Lovenox  Code Status: Full  Level of Care: Level of care: Telemetry Family Communication: Husband updated from ED  Disposition Plan:  Patient is from: Home  Anticipated d/c is to: Home  Anticipated d/c date is: 1/17 or 03/14/23  Patient currently: Pending tolerance or adequate oral intake  Consults called: None  Admission status: Observation     Briscoe Deutscher, MD [facility]  03/13/2023, 12:39 AM

## 2023-03-13 NOTE — Progress Notes (Addendum)
I entered patient room at 2030 and patient was sitting up in bed with her food tray in her lap eating carrots,broth and [place] ice. After leaving the patients room, I was called by the nurse secretary approximately 20 minutes later and stated, the patient had thrown up and wanted nausea medication. 400 ml of liquid noted in emesis bag with food pieces.  I gave the patient a prn medication and she asked me for another [place] ice and some more broth. Plan of care ongoing.

## 2023-03-13 NOTE — Progress Notes (Signed)
[facility] Progress Note  Patient: Krystal [name]     OAC:[id number]  DOA: 03/12/2023   PCP: Patient, No Pcp Per       Brief hospital course: This is a 33 year old female with history of chronic abdominal/pelvic pain, depression, anxiety, fibromyalgia, adrenal insufficiency and hypertension who presented to the ED hospital the same pain along with nausea and vomiting.  She states that the nausea and vomiting started at the same time as the pain and she feels that it is precipitated by the pain.  Most of her pain is in the suprapubic area but after she began to have vomiting she had some left lower quadrant pain radiating to her back as well. CT abdomen pelvis pelvic Doppler and pelvic ultrasound in the ED unremarkable. CT renal stone study on 1/13 was also negative UA did not show any infection but did show RBCs-greater than 50  Subjective:  Crying and stating that she has severe pain with vomiting.  Holding onto an emesis bag which has vomitus in it.  Assessment and Plan: Principal Problem:   Intractable nausea and vomiting -Per patient this is specifically due to the increase in her pain - Will continue antiemetics, give IV fluids and try to control her pain  Active Problems:  Pelvic and abdominal pain -Suprapubic pain is likely her chronic pain- Apparently she has issues with chronic pain with occasional exacerbations -Pain that is in her lower abdomen and radiating to her back may be muscular - At this time she does not appear to have a urinary tract infection and there is no imaging revealing other etiology -She states that placing a Foley catheter sometimes helps with her pain and therefore I have ordered it -IV pain medication ordered  Adrenal insufficiency - Unable to tolerate Cortef and this could be exacerbating her vomiting - Start IV hydrocortisone    Anxiety -As needed Ativan  Hypertension - Holding amlodipine, irbesartan  hydrochlorothiazide  Hypothyroidism - Hold off on Synthroid today  History of depression and anxiety Will resume other oral meds once vomiting improved      Code Status: Full Code Total time on patient care: 40 minutes DVT prophylaxis:  Lovenox     Objective:   Vitals:   03/13/23 0600 03/13/23 0615 03/13/23 0718 03/13/23 1021  BP: (!) 130/95  (!) 150/87 (!) 152/98  Pulse: 65 69 64 (!) 58  Resp: 15  16 11   Temp:   98 F (36.7 C) 97.7 F (36.5 C)  TempSrc:   Oral   SpO2: 96% 97% 98% 99%   There were no vitals filed for this visit. Exam: General exam: Appears uncomfortable-tearful and crying loudly HEENT: oral mucosa dry Respiratory system: Clear to auscultation.  Cardiovascular system: S1 & S2 heard  Gastrointestinal system: Abdomen soft, tender in left lower quadrant and suprapubic area nondistended. Normal bowel sounds   Extremities: No cyanosis, clubbing or edema      CBC: Recent Labs  Lab 03/09/23 1153 03/12/23 1610 03/13/23 0543  WBC 7.3 7.2 7.9  NEUTROABS  --  4.2  --   HGB 14.1 15.7* 14.5  HCT 41.0 47.4* 44.2  MCV 94.5 93.3 96.1  PLT 326 285 323   Basic Metabolic Panel: Recent Labs  Lab 03/09/23 1153 03/12/23 1301 03/13/23 0543  NA 134* 139 139  K 3.7 3.9 4.0  CL 103 103 108  CO2 24 22 24   GLUCOSE 105* 84 88  BUN 14 21* 18  CREATININE 0.78 0.79 0.87  CALCIUM  8.9 9.8 8.5*     Scheduled Meds:  enoxaparin (LOVENOX) injection  50 mg Subcutaneous Q24H   hydrocortisone sod succinate (SOLU-CORTEF) inj  50 mg Intravenous Q8H   ketorolac  30 mg Intravenous Q6H   pantoprazole (PROTONIX) IV  40 mg Intravenous Q24H   sodium chloride flush  3 mL Intravenous Q12H    Imaging and lab data personally reviewed   Author: Calvert Cantor  03/13/2023 1:15 PM  To contact [facility]>   Check the care team in [facility] and look for the attending/consulting [facility] provider listed  Log into [email or web address] and use [facility] universal password   Go to>  "[facility]"  and find provider  If you still have difficulty reaching the provider, please page the [facility] (Director on Call) for the Hospitalists listed on amion

## 2023-03-14 LAB — CBC
HCT: 42.5 % (ref 36.0–46.0)
Hemoglobin: 14.3 g/dL (ref 12.0–15.0)
MCH: 31.8 pg (ref 26.0–34.0)
MCHC: 33.6 g/dL (ref 30.0–36.0)
MCV: 94.4 fL (ref 80.0–100.0)
Platelets: 292 10*3/uL (ref 150–400)
RBC: 4.5 MIL/uL (ref 3.87–5.11)
RDW: 12.5 % (ref 11.5–15.5)
WBC: 7.4 10*3/uL (ref 4.0–10.5)
nRBC: 0 % (ref 0.0–0.2)

## 2023-03-14 LAB — BASIC METABOLIC PANEL
Anion gap: 4 — ABNORMAL LOW (ref 5–15)
BUN: 12 mg/dL (ref 6–20)
CO2: 27 mmol/L (ref 22–32)
Calcium: 8.7 mg/dL — ABNORMAL LOW (ref 8.9–10.3)
Chloride: 107 mmol/L (ref 98–111)
Creatinine, Ser: 0.74 mg/dL (ref 0.44–1.00)
GFR, Estimated: >60 mL/min (ref >60)
Glucose, Bld: 112 mg/dL — ABNORMAL HIGH (ref 70–99)
Potassium: 3.7 mmol/L (ref 3.5–5.1)
Sodium: 138 mmol/L (ref 135–145)

## 2023-03-14 MED ORDER — DIPHENHYDRAMINE HCL 50 MG/ML IJ SOLN
12.5000 mg | Freq: Once | INTRAMUSCULAR | Status: AC
  Administered 2023-03-14: 12.5 mg via INTRAVENOUS
  Filled 2023-03-14: qty 1

## 2023-03-14 MED ORDER — AMLODIPINE BESYLATE 10 MG PO TABS
10.0000 mg | ORAL_TABLET | Freq: Every day | ORAL | Status: DC
  Administered 2023-03-14 – 2023-03-21 (×8): 10 mg via ORAL
  Filled 2023-03-14 (×8): qty 1

## 2023-03-14 MED ORDER — LEVOTHYROXINE SODIUM 75 MCG PO TABS
150.0000 ug | ORAL_TABLET | Freq: Every day | ORAL | Status: DC
  Administered 2023-03-15 – 2023-03-21 (×7): 150 ug via ORAL
  Filled 2023-03-14 (×7): qty 2

## 2023-03-14 MED ORDER — CHLORHEXIDINE GLUCONATE CLOTH 2 % EX PADS
6.0000 | MEDICATED_PAD | Freq: Every day | CUTANEOUS | Status: DC
  Administered 2023-03-14 – 2023-03-18 (×5): 6 via TOPICAL

## 2023-03-14 MED ORDER — HYDROCORTISONE SOD SUC (PF) 100 MG IJ SOLR
100.0000 mg | Freq: Three times a day (TID) | INTRAMUSCULAR | Status: DC
Start: 2023-03-14 — End: 2023-03-20
  Administered 2023-03-15 – 2023-03-20 (×17): 100 mg via INTRAVENOUS
  Filled 2023-03-14 (×17): qty 2

## 2023-03-14 MED ORDER — HYDROCERIN EX CREA
TOPICAL_CREAM | CUTANEOUS | Status: DC | PRN
Start: 2023-03-14 — End: 2023-03-21
  Filled 2023-03-14: qty 113

## 2023-03-14 MED ORDER — HYDROCERIN EX CREA: TOPICAL_CREAM | Freq: Two times a day (BID) | CUTANEOUS | Status: DC

## 2023-03-14 MED ORDER — PANTOPRAZOLE SODIUM 40 MG IV SOLR
40.0000 mg | Freq: Two times a day (BID) | INTRAVENOUS | Status: DC
Start: 2023-03-14 — End: 2023-03-16
  Administered 2023-03-14 – 2023-03-16 (×4): 40 mg via INTRAVENOUS
  Filled 2023-03-14 (×4): qty 10

## 2023-03-14 MED ORDER — HYDROMORPHONE HCL 1 MG/ML IJ SOLN
1.0000 mg | INTRAMUSCULAR | Status: DC | PRN
  Administered 2023-03-14 – 2023-03-15 (×9): 1 mg via INTRAVENOUS
  Filled 2023-03-14 (×9): qty 1

## 2023-03-14 MED ORDER — SENNA 8.6 MG PO TABS
2.0000 | ORAL_TABLET | Freq: Every day | ORAL | Status: DC
Start: 2023-03-14 — End: 2023-03-21
  Administered 2023-03-14 – 2023-03-19 (×6): 17.2 mg via ORAL
  Filled 2023-03-14 (×8): qty 2

## 2023-03-14 MED ORDER — SODIUM CHLORIDE 0.9 % IV SOLN: INTRAVENOUS | Status: AC

## 2023-03-14 MED ORDER — HYDRALAZINE HCL 20 MG/ML IJ SOLN
10.0000 mg | Freq: Four times a day (QID) | INTRAMUSCULAR | Status: DC | PRN
  Administered 2023-03-17: 10 mg via INTRAVENOUS
  Filled 2023-03-14: qty 1

## 2023-03-14 NOTE — Progress Notes (Signed)
Patient continues to ask the nurse tech for food items despite expressing to this writer that she has nausea and pain. I have explained that continual eating food items is going to increase the nausea and vomiting.

## 2023-03-14 NOTE — Progress Notes (Signed)
Removed patients IV access. Catheter intact. Swelling notes to right forearm and hand. Patient stated, "my fingers are swollen". IV team consult ordered for restart due to need for US guidance. Plan of care ongoing.

## 2023-03-14 NOTE — Progress Notes (Signed)
[facility] Progress Note  Patient: Krystal [name]     KVQ:[id number]  DOA: 03/12/2023   PCP: Patient, No Pcp Per       Brief hospital course: This is a 33 year old female with history of chronic abdominal/pelvic pain, depression, anxiety, fibromyalgia, adrenal insufficiency and hypertension who presented to the ED hospital the same pain along with nausea and vomiting.  She states that the nausea and vomiting started at the same time as the pain and she feels that it is precipitated by the pain.  Most of her pain is in the suprapubic area but after she began to have vomiting she had some left lower quadrant pain radiating to her back as well. CT abdomen pelvis, pelvic Doppler and pelvic ultrasound in the ED unremarkable. CT renal stone study on 1/13 was also negative UA did not show any infection    Subjective:  Continues to complain of severe pain in the same area.  Still vomiting.  Asking if the Foley can be removed today as it feels that it is irritating her more. Having itching from Morphine. She is agreeable to Dilaudid   Assessment and Plan: Principal Problem:   Intractable nausea and vomiting -Per patient this is specifically due to the increase in her pain-does not appear to be a viral illness - Will continue antiemetics, give IV fluids and try to control her pain -Protonix started as well -Urine pregnancy negative -Continue IV fluids-downgrade to full liquids-increase hydrocortisone from 50- 100 mg 3 times daily    Active Problems:  Pelvic and abdominal pain -Suprapubic pain is likely her chronic pain- Apparently she has issues with chronic pain with occasional exacerbations -She has been evaluated by OB/GYN and GI in the past -Pain that is in her lower abdomen and radiating to her back may be muscular - At this time she does not appear to have a urinary tract infection and there is no imaging revealing other etiology -Discussed plan with urology who recommends  that she follow-up with her outpatient urologist who manages her interstitial cystitis-she has an appointment for February 9-in the meantime I will try to control her pain while she is in the hospital -It appears she received a prescription for oxycodone on 1/13-will not need to order narcotics on DC  Adrenal insufficiency - Unable to tolerate Cortef and this could be exacerbating her vomiting - IV hydrocortisone    Anxiety/depression -As needed Ativan -Continue Prozac -Appears to be severely depressed as she states her life has changed significantly because of her chronic pain  Hypertension - Resume amlodipine -Suspect great deal for elevated blood pressure is due to her pain and anxiety  - Hypothyroidism - resume Synthroid today  History of depression and anxiety Will resume other oral meds once vomiting improved  Left hemorrhagic ovarian 04/22/2022 - Status post laparoscopic lysis of adhesions  UDS + for Benzo, narcotics and Marijuana - on 1/13- received Ativan and Dilaudid      Code Status: Full Code Total time on patient care: 43 minutes DVT prophylaxis:  Lovenox     Objective:   Vitals:   03/14/23 0607 03/14/23 0655 03/14/23 0939 03/14/23 1413  BP: (!) 154/111 (!) 158/100 (!) 167/114 (!) 187/114  Pulse: (!) 58  86 90  Resp: 16  18 16   Temp: 98.8 F (37.1 C)  98.4 F (36.9 C) 98.6 F (37 C)  TempSrc: Oral  Oral Oral  SpO2: 99%  100% 97%  Weight:      Height:  Filed Weights   03/13/23 1522  Weight: 101.2 kg   Exam: General exam: Appears uncomfortable - again tearful and crying loudly HEENT: oral mucosa moist Respiratory system: Clear to auscultation.  Cardiovascular system: S1 & S2 heard  Gastrointestinal system: Abdomen soft, tender in left lower quadrant and suprapubic area nondistended. Normal bowel sounds   Extremities: No cyanosis, clubbing or edema  CBC: Recent Labs  Lab 03/09/23 1153 03/12/23 1610 03/13/23 0543 03/14/23 0547  WBC  7.3 7.2 7.9 7.4  NEUTROABS  --  4.2  --   --   HGB 14.1 15.7* 14.5 14.3  HCT 41.0 47.4* 44.2 42.5  MCV 94.5 93.3 96.1 94.4  PLT 326 285 323 292   Basic Metabolic Panel: Recent Labs  Lab 03/09/23 1153 03/12/23 1301 03/13/23 0543 03/14/23 0547  NA 134* 139 139 138  K 3.7 3.9 4.0 3.7  CL 103 103 108 107  CO2 24 22 24 27   GLUCOSE 105* 84 88 112*  BUN 14 21* 18 12  CREATININE 0.78 0.79 0.87 0.74  CALCIUM 8.9 9.8 8.5* 8.7*     Scheduled Meds:  Chlorhexidine Gluconate Cloth  6 each Topical Daily   enoxaparin (LOVENOX) injection  50 mg Subcutaneous Q24H   FLUoxetine  40 mg Oral Daily   fluticasone furoate-vilanterol  1 puff Inhalation Daily   hydrocortisone sod succinate (SOLU-CORTEF) inj  100 mg Intravenous Q8H   oxybutynin  5 mg Oral QHS   oxyCODONE  5 mg Oral QID   pantoprazole (PROTONIX) IV  40 mg Intravenous Q12H   senna  2 tablet Oral Daily   sodium chloride flush  3 mL Intravenous Q12H    Imaging and lab data personally reviewed   Author: Calvert Cantor  03/14/2023 3:07 PM  To contact [facility]>   Check the care team in [facility] and look for the attending/consulting [facility] provider listed  Log into [email or web address] and use [place]'s universal password   Go to> "[facility]"  and find provider  If you still have difficulty reaching the provider, please page the [facility] (Director on Call) for the Hospitalists listed on amion

## 2023-03-14 NOTE — Plan of Care (Signed)

## 2023-03-14 NOTE — Progress Notes (Signed)
Blood pressure is 158/100 on recheck. MD on call notified.

## 2023-03-15 LAB — BASIC METABOLIC PANEL
Anion gap: 7 (ref 5–15)
BUN: 5 mg/dL — ABNORMAL LOW (ref 6–20)
CO2: 26 mmol/L (ref 22–32)
Calcium: 8.7 mg/dL — ABNORMAL LOW (ref 8.9–10.3)
Chloride: 105 mmol/L (ref 98–111)
Creatinine, Ser: 0.74 mg/dL (ref 0.44–1.00)
GFR, Estimated: >60 mL/min (ref >60)
Glucose, Bld: 106 mg/dL — ABNORMAL HIGH (ref 70–99)
Potassium: 3.6 mmol/L (ref 3.5–5.1)
Sodium: 138 mmol/L (ref 135–145)

## 2023-03-15 LAB — TSH: TSH: 0.816 u[IU]/mL (ref 0.350–4.500)

## 2023-03-15 MED ORDER — MORPHINE SULFATE (PF) 4 MG/ML IV SOLN
4.0000 mg | INTRAVENOUS | Status: DC | PRN
  Administered 2023-03-15 – 2023-03-17 (×13): 4 mg via INTRAVENOUS
  Filled 2023-03-15 (×13): qty 1

## 2023-03-15 MED ORDER — DIPHENHYDRAMINE HCL 50 MG/ML IJ SOLN
25.0000 mg | Freq: Four times a day (QID) | INTRAMUSCULAR | Status: DC | PRN
  Administered 2023-03-15 – 2023-03-16 (×2): 25 mg via INTRAVENOUS
  Filled 2023-03-15 (×2): qty 1

## 2023-03-15 MED ORDER — MORPHINE SULFATE (PF) 2 MG/ML IV SOLN
2.0000 mg | INTRAVENOUS | Status: DC | PRN
  Administered 2023-03-15: 2 mg via INTRAVENOUS
  Filled 2023-03-15: qty 1

## 2023-03-15 MED ORDER — ZOLPIDEM TARTRATE 5 MG PO TABS
5.0000 mg | ORAL_TABLET | Freq: Every day | ORAL | Status: DC
  Administered 2023-03-15 – 2023-03-20 (×6): 5 mg via ORAL
  Filled 2023-03-15 (×6): qty 1

## 2023-03-15 MED ORDER — SODIUM CHLORIDE 0.9 % IV SOLN: INTRAVENOUS | Status: DC

## 2023-03-15 NOTE — Progress Notes (Signed)
Patient complained of itching all over. Has prn cream for skin, stated, "its not working and I think its from the dilaudid". MD on call notified. One time prn IV medication ordered. See MAR. Plan of care ongoing.

## 2023-03-15 NOTE — Plan of Care (Signed)
  Problem: Nutrition: Goal: Adequate nutrition will be maintained Outcome: Progressing   Problem: Elimination: Goal: Will not experience complications related to bowel motility Outcome: Progressing   Problem: Pain Managment: Goal: General experience of comfort will improve and/or be controlled Outcome: Progressing   Problem: Safety: Goal: Ability to remain free from injury will improve Outcome: Progressing

## 2023-03-15 NOTE — Plan of Care (Signed)

## 2023-03-15 NOTE — Progress Notes (Signed)
[facility] Progress Note  Patient: Krystal [name]     WJX:[id number]  DOA: 03/12/2023   PCP: Patient, No Pcp Per       Brief hospital course: This is a 33 year old female with history of chronic abdominal/pelvic pain, depression, anxiety, fibromyalgia, adrenal insufficiency and hypertension who presented to the ED hospital the same pain along with nausea and vomiting.  She states that the nausea and vomiting started at the same time as the pain and she feels that it is precipitated by the pain.  Most of her pain is in the suprapubic area but after she began to have vomiting she had some left lower quadrant pain radiating to her back as well. CT abdomen pelvis, pelvic Doppler and pelvic ultrasound in the ED unremarkable. CT renal stone study on 1/13 was also negative UA did not show any infection    Subjective:  Still having pain and anxiety. Still is vomiting and has had about 3 episodes today. Today she thinks maybe the vomiting is related to anxiety. Often hurts when she urinates and is considering getting a foley again. Open to speaking with psychiatry. States Dilaudid makes her itch more than the Morphine and would like Morphine again.   Assessment and Plan: Principal Problem:   Intractable nausea and vomiting -Per patient this is specifically due to the increase in her pain-does not appear to be a viral illness - Will continue antiemetics & IV fluids   -Protonix started as well -Urine pregnancy negative -Continue IV fluids-downgrade to full liquids-increased hydrocortisone from 50- 100 mg 3 times daily but no improvement in vomiting     Active Problems:  Pelvic and abdominal pain -Suprapubic pain is likely her chronic pain- Apparently she has issues with chronic pain with occasional exacerbations -She has been evaluated by OB/GYN and GI in the past -Pain that is in her lower abdomen and radiating to her back may be muscular - At this time she does not appear to  have a urinary tract infection and there is no imaging revealing other etiology -Discussed plan with urology who recommends that she follow-up with her outpatient urologist who manages her interstitial cystitis-she has an appointment for February 9-in the meantime I have been trying to control her pain while she is in the hospital -It appears she received a prescription for oxycodone on 1/13-will not need to order narcotics on DC  Adrenal insufficiency - Unable to tolerate Cortef and this could be exacerbating her vomiting - IV hydrocortisone    Anxiety/depression -As needed Ativan -Continue Prozac -Appears to be severely depressed as she states her life has changed significantly because of her chronic pain - per RN, she cries constantly  - as I don't see any improvement in her symptoms with the extensive medications I have ordered for her, I will ask for a psych consult.   Hypertension - Resume amlodipine -Suspect great deal for elevated blood pressure is due to her pain and anxiety  - Hypothyroidism - Synthroid - check TSH - may not be absorbing Synthroid due to vomiting  History of depression and anxiety Will resume other oral meds once vomiting improved  Left hemorrhagic ovarian 04/22/2022 - Status post laparoscopic lysis of adhesions  UDS + for Benzo, narcotics and Marijuana - on 1/13- received Ativan and Dilaudid which explains the Benzos and Narcotics on UDS      Code Status: Full Code Total time on patient care: 30 minutes DVT prophylaxis:  Lovenox     Objective:  Vitals:   03/14/23 2051 03/15/23 0541 03/15/23 0753 03/15/23 1350  BP: (!) 155/98 (!) 141/86  (!) 166/106  Pulse: 74 (!) 59  81  Resp: 15 16  16   Temp: 98.9 F (37.2 C) 97.7 F (36.5 C)  98.7 F (37.1 C)  TempSrc: Oral   Oral  SpO2: 98% 96% 98% 98%  Weight:      Height:       Filed Weights   03/13/23 1522  Weight: 101.2 kg   Exam: General exam: Appears uncomfortable- crying- gripping her  abdomen in pain HEENT: oral mucosa moist Respiratory system: Clear to auscultation.  Cardiovascular system: S1 & S2 heard  Gastrointestinal system: Abdomen soft, tender across lower abdomen today. Normal bowel sounds   Extremities: No cyanosis, clubbing or edema  CBC: Recent Labs  Lab 03/09/23 1153 03/12/23 1610 03/13/23 0543 03/14/23 0547  WBC 7.3 7.2 7.9 7.4  NEUTROABS  --  4.2  --   --   HGB 14.1 15.7* 14.5 14.3  HCT 41.0 47.4* 44.2 42.5  MCV 94.5 93.3 96.1 94.4  PLT 326 285 323 292   Basic Metabolic Panel: Recent Labs  Lab 03/09/23 1153 03/12/23 1301 03/13/23 0543 03/14/23 0547 03/15/23 0514  NA 134* 139 139 138 138  K 3.7 3.9 4.0 3.7 3.6  CL 103 103 108 107 105  CO2 24 22 24 27 26   GLUCOSE 105* 84 88 112* 106*  BUN 14 21* 18 12 5*  CREATININE 0.78 0.79 0.87 0.74 0.74  CALCIUM 8.9 9.8 8.5* 8.7* 8.7*     Scheduled Meds:  amLODipine  10 mg Oral Daily   Chlorhexidine Gluconate Cloth  6 each Topical Daily   enoxaparin (LOVENOX) injection  50 mg Subcutaneous Q24H   FLUoxetine  40 mg Oral Daily   fluticasone furoate-vilanterol  1 puff Inhalation Daily   hydrocortisone sod succinate (SOLU-CORTEF) inj  100 mg Intravenous Q8H   levothyroxine  150 mcg Oral Q0600   oxybutynin  5 mg Oral QHS   oxyCODONE  5 mg Oral QID   pantoprazole (PROTONIX) IV  40 mg Intravenous Q12H   senna  2 tablet Oral Daily   sodium chloride flush  3 mL Intravenous Q12H   zolpidem  5 mg Oral QHS    Imaging and lab data personally reviewed   Author: Calvert Cantor  03/15/2023 3:00 PM  To contact [facility]>   Check the care team in [facility] and look for the attending/consulting [facility] provider listed  Log into [email or web address] and use [place]'s universal password   Go to> "[facility]"  and find provider  If you still have difficulty reaching the provider, please page the [facility] (Director on Call) for the Hospitalists listed on amion

## 2023-03-15 NOTE — Progress Notes (Signed)
Patient took a shower. Tele notified that monitor would off the monitor while bathing. Plan of care ongoing.

## 2023-03-15 NOTE — Progress Notes (Signed)
Patient called out to the nurses station and stated, " I threw up". Prn medication give to patient. Brown liquid noted in the trash can where the patient said she threw up in. Plan of care ongoing.

## 2023-03-16 MED ORDER — OLANZAPINE 5 MG PO TBDP
2.5000 mg | ORAL_TABLET | Freq: Every day | ORAL | Status: DC
  Administered 2023-03-16: 2.5 mg via ORAL
  Filled 2023-03-16: qty 0.5

## 2023-03-16 MED ORDER — DIPHENHYDRAMINE HCL 50 MG/ML IJ SOLN
25.0000 mg | Freq: Four times a day (QID) | INTRAMUSCULAR | Status: DC | PRN
  Administered 2023-03-16 – 2023-03-18 (×4): 25 mg via INTRAVENOUS
  Filled 2023-03-16 (×4): qty 1

## 2023-03-16 MED ORDER — DIPHENHYDRAMINE HCL 25 MG PO CAPS
25.0000 mg | ORAL_CAPSULE | Freq: Four times a day (QID) | ORAL | Status: DC | PRN
  Filled 2023-03-16: qty 1

## 2023-03-16 MED ORDER — OLANZAPINE 2.5 MG PO TABS
2.5000 mg | ORAL_TABLET | Freq: Three times a day (TID) | ORAL | Status: DC | PRN
  Administered 2023-03-16 – 2023-03-20 (×4): 2.5 mg via ORAL
  Filled 2023-03-16 (×8): qty 1

## 2023-03-16 MED ORDER — PANTOPRAZOLE SODIUM 40 MG PO TBEC
40.0000 mg | DELAYED_RELEASE_TABLET | Freq: Two times a day (BID) | ORAL | Status: DC
  Administered 2023-03-16 – 2023-03-21 (×10): 40 mg via ORAL
  Filled 2023-03-16 (×10): qty 1

## 2023-03-16 MED ORDER — OXYCODONE HCL 5 MG PO TABS: 5.0000 mg | ORAL_TABLET | Freq: Four times a day (QID) | ORAL | Status: DC

## 2023-03-16 NOTE — Progress Notes (Addendum)
[facility] Progress Note  Patient: Krystal [name]     UJW:[id number]  DOA: 03/12/2023   PCP: Patient, No Pcp Per       Brief hospital course: This is a 33 year old female with history of chronic abdominal/pelvic pain, depression, anxiety, fibromyalgia, adrenal insufficiency and hypertension who presented to the ED hospital abdominal pain along with nausea and vomiting.  She states that the nausea and vomiting started at the same time as the pain and she feels that it is precipitated by the pain.  Most of her pain is in the suprapubic area but after she began to have vomiting she had some left lower quadrant pain radiating to her back as well. CT abdomen pelvis, pelvic Doppler and pelvic ultrasound in the ED unremarkable. CT renal stone study on 1/13 was also negative UA did not show any infection   Urine pregnancy negative   Subjective:  Ongoing severe pain along with vomiting.  She vomited after dinner last night and vomited again this morning.  Assessment and Plan: Principal Problem:   Intractable nausea and vomiting -Suspected now that it is secondary to her anxiety - Will continue antiemetics & IV fluids   -Protonix started as well - regular diet as tolerated   Active Problems:  Pelvic and abdominal pain -Suprapubic pain is likely her chronic pain- Apparently she has issues with chronic pain interstitial cystitis with occasional exacerbations -She has been evaluated by OB/GYN and GI in the past -Pain that is in her lower abdomen and radiating to her back may be muscular - At this time she does not appear to have a urinary tract infection and there is no imaging revealing other etiology -Discussed plan with urology who recommends that she follow-up with her outpatient urologist who manages her interstitial cystitis-she has an appointment for February 9-in the meantime I have been trying to control her pain while she is in the hospital -It appears she received a  prescription for oxycodone on 1/13-will not need to order narcotics on DC  Adrenal insufficiency - IV hydrocortisone being given and Cortef on hold due to vomiting   Anxiety/depression -Appears to be severely depressed as she states her life has changed significantly because of her chronic pain - per RN, she cries constantly  - as I don't see any improvement in her symptoms with the medications I have ordered, I have requested a psych eval  Hypertension - Resume amlodipine -Suspect great deal of elevated blood pressure is due to her pain and anxiety  - Hypothyroidism - cont Synthroid   Left hemorrhagic ovarian cyst 04/22/2022 - Status post laparoscopic lysis of adhesions  UDS + for Benzo, narcotics and Marijuana - on 1/13- received Ativan and Dilaudid which explains the Benzos and Narcotics on UDS -She was taking some delta 8 Gummies at home to see if it would help her pain      Code Status: Full Code Total time on patient care: 30 minutes DVT prophylaxis:  Lovenox     Objective:   Vitals:   03/15/23 2147 03/16/23 0203 03/16/23 0827 03/16/23 1339  BP: (!) 164/102 (!) 156/115  (!) 194/140  Pulse: 64 61  97  Resp: 16 14  18   Temp: 98.1 F (36.7 C) 98 F (36.7 C)  98.9 F (37.2 C)  TempSrc: Oral Oral  Oral  SpO2: 97% 99% 98% 98%  Weight:      Height:       Filed Weights   03/13/23 1522  Weight: 101.2  kg   Exam: General exam: Appears uncomfortable- crying- HEENT: oral mucosa moist Respiratory system: Clear to auscultation.  Cardiovascular system: S1 & S2 heard  Gastrointestinal system: Abdomen soft, tender across lower abdomen and left upper abdomen. Normal bowel sounds   Extremities: No cyanosis, clubbing or edema  CBC: Recent Labs  Lab 03/12/23 1610 03/13/23 0543 03/14/23 0547  WBC 7.2 7.9 7.4  NEUTROABS 4.2  --   --   HGB 15.7* 14.5 14.3  HCT 47.4* 44.2 42.5  MCV 93.3 96.1 94.4  PLT 285 323 292   Basic Metabolic Panel: Recent Labs  Lab  03/12/23 1301 03/13/23 0543 03/14/23 0547 03/15/23 0514  NA 139 139 138 138  K 3.9 4.0 3.7 3.6  CL 103 108 107 105  CO2 22 24 27 26   GLUCOSE 84 88 112* 106*  BUN 21* 18 12 5*  CREATININE 0.79 0.87 0.74 0.74  CALCIUM 9.8 8.5* 8.7* 8.7*     Scheduled Meds:  amLODipine  10 mg Oral Daily   Chlorhexidine Gluconate Cloth  6 each Topical Daily   enoxaparin (LOVENOX) injection  50 mg Subcutaneous Q24H   FLUoxetine  40 mg Oral Daily   fluticasone furoate-vilanterol  1 puff Inhalation Daily   hydrocortisone sod succinate (SOLU-CORTEF) inj  100 mg Intravenous Q8H   levothyroxine  150 mcg Oral Q0600   OLANZapine zydis  2.5 mg Oral QHS   oxybutynin  5 mg Oral QHS   oxyCODONE  5 mg Oral QID   pantoprazole  40 mg Oral BID   senna  2 tablet Oral Daily   sodium chloride flush  3 mL Intravenous Q12H   zolpidem  5 mg Oral QHS    Imaging and lab data personally reviewed   Author: Calvert Cantor  03/16/2023 2:21 PM  To contact [facility]>   Check the care team in [facility] and look for the attending/consulting [facility] provider listed  Log into [email or web address] and use [place]'s universal password   Go to> "[facility]"  and find provider  If you still have difficulty reaching the provider, please page the [facility] (Director on Call) for the Hospitalists listed on amion

## 2023-03-16 NOTE — Consult Note (Signed)
Krystal [name]  MRN: [id number]  DOB: 10-29-1990  Consult Order details:  severe anxiety/Dr. Candee Furbish of Visit: In person    Psychiatry Consult Evaluation  Service Date: March 16, 2023 LOS:  LOS: 3 days  Chief Complaint "I can't keep going like this"  Primary Psychiatric Diagnoses  Mdd/recurrent/severe 2.  GAD 3. R/o somatic sx d/o  Assessment  Krystal [name] is a 33 y.o. female admitted: Medicallyfor 03/12/2023 11:37 AM for pelvic pain. She carries the psychiatric diagnoses of depression and anxiety and has a past medical history of  hypothyroidism, HTN, intractable vomiting, and interstitial cystitis.   I am actually very familiar with this pt from prior prolonged admission for pregnancy complications - was treating for depression and anxiety. Historically sx are much worse in inpt medical setting due to hx medical trauma.  She had done fairly well as an outpt with Dr. Mercy Riding for some time (moved from venlafaxine to fluoxetine) but was lot to followup when her insurance changed.   Her current presentation of hysterical crying and worsening of physical sx of N+V, bladder pain is most consistent with known diagnoses of depression and anxiety; there is some concern for a bidirectional relationship between physical sx and mental health (ie somatic sx d/o) based on timeline of events and pt report.  Current outpatient psychotropic medications include prozac 40 mg and historically she has had a fair to good response to these medications, but only when also receiving therapy. She has been out of therapy for several months due to insurance issues. She was compliant with medications prior to admission as evidenced by pt report. On initial examination, patient was continually crying through exam and briefly interrupted exam to vomit. Please see plan below for detailed recommendations.   Diagnoses:  Active Hospital  problems: Principal Problem:   Intractable nausea and vomiting Active Problems:   Anxiety   Hypothyroidism due to Hashimoto's thyroiditis   Resistant hypertension   MDD (major depressive disorder), recurrent episode, severe (HCC)   Moderate persistent asthma with acute exacerbation   Adrenal insufficiency (HCC)   Chronic interstitial cystitis   Intractable vomiting with nausea    Plan   ## Psychiatric Medication Recommendations:  -- c fluoxteine 40 mg -- s olanzapine 2.5 mg at bedtime and 2.5 mg TID PRN anxiety/nausea also adjunctive for depression  ## Medical Decision Making Capacity: Not specifically addressed in this encounter  ## Further Work-up:  -- none currently  While pt on Qtc prolonging medications, please monitor & replete K+ to 4 and Mg2+ to 2 -- most recent EKG on 1/17 had QtC of 433 -- Pertinent labwork reviewed earlier this admission includes: UDS + Opiates,  BZD, THC   ## Disposition:-- There are no psychiatric contraindications to discharge at this time  ## Behavioral / Environmental: - Patients with borderline personality traits/disorder often use the language of physical pain to communicate both physical and emotional suffering. It is important to address pain complaints as they arise and attempt to identify an etiology, either organic or psychiatric. In patients with chronic pain, it is important to have a discussion with the patient about expectations about pain control. or Utilize compassion and acknowledge the patient's experiences while setting clear and realistic expectations for care.    ## Safety and Observation Level:  - Based on my clinical evaluation, I estimate the patient to be at low risk of self harm in the current setting. - At this  time, we recommend  routine. This decision is based on my review of the chart including patient's history and current presentation, interview of the patient, mental status examination, and consideration of suicide  risk including evaluating suicidal ideation, plan, intent, suicidal or self-harm behaviors, risk factors, and protective factors. This judgment is based on our ability to directly address suicide risk, implement suicide prevention strategies, and develop a safety plan while the patient is in the clinical setting. Please contact our team if there is a concern that risk level has changed.  CSSR Risk Category:C-SSRS RISK CATEGORY: No Risk  Suicide Risk Assessment: Patient has following modifiable risk factors for suicide: under treated depression  and lack of access to outpatient mental health resources, which we are addressing by medication changes and recommending low-cost therapy options Patient has following non-modifiable or demographic risk factors for suicide: separation or divorce and history of suicide attempt Patient has the following protective factors against suicide: Supportive family and Minor children in the home  Thank you for this consult request. Recommendations have been communicated to the primary team.  We will continue to follow at this time.   Krystal [name] A Krystal [name]       History of Present Illness  Relevant Aspects of [facility] Course:  Admitted on 03/12/2023 for pelvic pain. They have been crying uncontrollably through much of admission.   Patient Report:  Pt seen in early afternoon - remembers this author from prior visits.   Patient seen in the mid afternoon.  Reports she is doing very poorly and constantly having bladder pain.  Her husband has said that he wants a divorce, but is leaving her to file all paperwork (telling her she has to make up her mind) and stating that if she does leave him he will fight for custody of their young disabled children.  Patient demonstrates good awareness that there is a strong relationship between her psychosocial stressors, overall mental health and physical symptoms-stayed with her sister in [place] for several weeks when her  marriage began to fall apart and was able to get basically off of pain medications with significantly reduced benzodiazepine requirements at that time.  Today she complains of poor sleep (only 2 to 3 hours on Ambien), intractable bladder pain, constant nausea/vomiting/throwing up, etc.  She has not been able to see a psychiatrist since she stopped following up with Dr. Mercy Riding due to insurance issues.  She would be open to an outpatient referral that could see her with no insurance.  Reports she does not know how long she can keep going on, but denies any suicidal thoughts, homicidal thoughts and hallucinations.  Her primary motivation for living is her young daughters -her youngest is now 16 months adjusted and starting to walk but requiring 8 hours of in-home nursing care a day.  She is open to trying Zyprexa after discussion of risk/benefits/side effects; was actually on this with okay response towards the tail end of her pregnancy but stopped after delivery as her symptoms improved fairly quickly after her delivery.  She requests ongoing support from psychiatry team this hospitalization.   Psych ROS:  Partly truncated due to crying/vomiting Depression: low mood, worthlessness, constant crying, etc. Denies SI Anxiety:  as in HPI Mania (lifetime and current): denies Psychosis: (lifetime and current): denies  Collateral information:  Spoke to nursing staff/primary MD - crying contiually all day.   Review of Systems  Reason unable to perform ROS: bladder pain, nausea, vomiting most significant.  Psychiatric and Social History  Psychiatric History:  Information collected from pt, medical record  Prev Dx/Sx: MDD, anxiety, ADD Current Psych Provider: None (prev dr. 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 Legal Hx: deferred Living Situation: tenuous - separating from husband Spiritual Hx: deferred Access to weapons/lethal means: historically no   Substance History Alcohol: no Tobacco: no Illicit drugs: no Prescription drug abuse: no Rehab hx: no  Exam Findings  Physical Exam:  Vital Signs:  Temp:  [98 F (36.7 C)-98.7 F (37.1 C)] 98 F (36.7 C) (01/20 0203) Pulse Rate:  [61-81] 61 (01/20 0203) Resp:  [14-16] 14 (01/20 0203) BP: (156-166)/(102-115) 156/115 (01/20 0203) SpO2:  [97 %-99 %] 98 % (01/20 0827) Blood pressure (!) 156/115, pulse 61, temperature 98 F (36.7 C), temperature source Oral, resp. rate 14, height 5\' 11"  (1.803 m), weight 101.2 kg, SpO2 98%, not currently breastfeeding. Body mass index is 31.13 kg/m.  Physical Exam HENT:     Head: Normocephalic.  Pulmonary:     Effort: Pulmonary effort is normal.  Neurological:     Mental Status: She is alert.     Mental Status Exam: General Appearance:  in hospital attire  Orientation:  Full (Time, Place, and Person)  Memory:  Immediate;   Good Recent;   Fair Remote;   Good  Concentration:  Concentration: Good  Recall:  Good  Attention  Good  Eye Contact:  Good  Speech:   rapid not pressured  Language:  Good  Volume:  Normal  Mood: "I feel out of controL"  Affect:  Congruent and Tearful  Thought Process:  Coherent  Thought Content:   devoid of overt delusions/paranoia  Suicidal Thoughts:  No  Homicidal Thoughts:  No  Judgement:  Fair  Insight:  Good  Psychomotor Activity:  Increased (restless)  Akathisia:  No  Fund of Knowledge:  Good      Assets:  Communication Skills Desire for Improvement Housing Social Support Talents/Skills   Other History   These have been  pulled in through the EMR, reviewed, and updated if appropriate.  Family History:  The patient's family history includes Atrial fibrillation in her father; Coronary artery disease in her mother; Heart attack in her maternal grandfather; Heart failure in her maternal grandfather; Hypertension in her father and mother; Stroke in her father.  Medical History: Past Medical History:  Diagnosis Date   Acute acalculous cholecystitis s/p lap cholecystectomy 05/11/2020 05/11/2020   Angio-edema 01/15/2021   Anxiety    Asthma    Depression    HELLP (hemolytic anemia/elev liver enzymes/low platelets in pregnancy) 12/08/2017   Hemorrhagic cyst of left ovary 04/15/2022   History of borderline personality disorder    History of HELLP syndrome, currently pregnant 04/21/2019   Formatting of this note might be different from the original.  History of HELLP syndrome at 26 weeks 5 days (12/07/2017); [facility] to [facility] with BP 200/120s, Plt 52, AST/ALT 471/375  APS evaluation negative (04/2019)   Daily ASA 81 mg   Challenging blood pressure control in current pregnancy      Plan:  - Baseline pre-eclampsia labs this pregnancy within normal limits (Cr: 0.71;  urine p:c 0.106)  -    IC (interstitial cystitis)    Kidney stones    Migraines    Necrotizing fasciitis (HCC)    left hand amputation   PTSD (post-traumatic stress disorder)    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   Severe uncontrolled hypertension 10/08/2017   UTI (urinary tract infection)     Surgical History: Past Surgical History:  Procedure Laterality Date   APPENDECTOMY     BIOPSY  05/10/2020   Procedure: BIOPSY;  Surgeon: Willis Modena, MD;  Location: [facility] ENDOSCOPY;  Service: Endoscopy;;   BIOPSY  05/02/2022   Procedure: BIOPSY;  Surgeon: Benancio Deeds, MD;  Location: [facility] ENDOSCOPY;  Service: Gastroenterology;;   CESAREAN SECTION N/A 12/06/2017   Procedure: CESAREAN SECTION;  Surgeon: Conan Bowens, MD;  Location: [facility] SU[facility]ES;   Service: Obstetrics;  Laterality: N/A;   CESAREAN SECTION  07/21/2021   Procedure: CESAREAN SECTION;  Surgeon: Reva Bores, MD;  Location: [facility] LD ORS;  Service: Obstetrics;;   CHOLECYSTECTOMY N/A 05/11/2020   Procedure: SINGLE SITE LAPAROSCOPIC CHOLECYSTECTOMY AND LIVER BIOSY;  Surgeon: Karie Soda, MD;  Location: [facility] ORS;  Service: General;  Laterality: N/A;   ESOPHAGOGASTRODUODENOSCOPY (EGD) WITH PROPOFOL N/A 05/10/2020   Procedure: ESOPHAGOGASTRODUODENOSCOPY (EGD) WITH PROPOFOL;  Surgeon: Willis Modena, MD;  Location: [facility] ENDOSCOPY;  Service: Endoscopy;  Laterality: N/A;   FLEXIBLE SIGMOIDOSCOPY N/A 05/02/2022   Procedure: FLEXIBLE SIGMOIDOSCOPY;  Surgeon: Benancio Deeds, MD;  Location[facility] ENDOSCOPY;  Service: Gastroenterology;  Laterality: N/A;   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   HAND RECONSTRUCTION Right    INCISION AND DRAINAGE     LAPAROSCOPIC LYSIS OF ADHESIONS  04/22/2022   Procedure: LAPAROSCOPIC LYSIS OF ADHESIONS;  Surgeon: [place] Bing, MD;  Location: [facility] OR;  Service: Gynecology;;   LAPAROSCOPIC OVARIAN CYSTECTOMY Left 04/22/2022   Procedure: LAPAROSCOPIC LEFT OVARIAN CYSTECTOMY;  Surgeon: [place] Bing, MD;  Location: [facility] OR;  Service: Gynecology;  Laterality: Left;   NASAL SEPTUM SURGERY       Medications:   Current Facility-Administered Medications:    0.9 %  sodium chloride infusion, , Intravenous, Continuous, Rizwan, Saima, MD, Last Rate: 125 mL/hr at 03/15/23 2250, New Bag at 03/15/23 2250   acetaminophen (TYLENOL) tablet 650 mg, 650 mg, Oral, Q6H PRN, 650 mg at 03/16/23 0808 **OR** acetaminophen (TYLENOL) suppository 650 mg, 650 mg, Rectal, Q6H PRN, Opyd, Timothy S, MD   albuterol (PROVENTIL) (2.5 MG/3ML) 0.083% nebulizer solution 2.5 mg, 2.5 mg, Nebulization, Q4H PRN, Opyd, Lavone Neri, MD   amLODipine (NORVASC) tablet 10 mg, 10 mg, Oral, Daily, Rizwan, Saima, MD, 10 mg at 03/16/23 1005   Chlorhexidine Gluconate Cloth 2 % PADS 6 each, 6  each, Topical, Daily, Rizwan, Saima, MD, 6 each at 03/16/23 1000   diphenhydrAMINE (BENADRYL) injection 25 mg, 25 mg, Intravenous, Q6H PRN, Rizwan, Saima, MD, 25 mg at 03/16/23 1220   enoxaparin (LOVENOX) injection 50 mg, 50 mg, Subcutaneous, Q24H, Opyd, Timothy S, MD, 50 mg at 03/16/23 1004   FLUoxetine (PROZAC) capsule 40 mg, 40 mg, Oral, Daily, Rizwan, Saima, MD, 40 mg at 03/16/23 1005   fluticasone furoate-vilanterol (BREO ELLIPTA) 100-25 MCG/ACT 1 puff, 1 puff, Inhalation, Daily, Rizwan, Saima, MD, 1 puff at 03/16/23 0826   hydrALAZINE (APRESOLINE) injection 10 mg, 10 mg, Intravenous, Q6H PRN, Rizwan, Saima, MD   hydrocerin (EUCERIN) cream, , Topical, Q3H PRN, [place] Denmark, Saima, MD   hydrocortisone sodium succinate (SOLU-CORTEF) 100 MG injection 100 mg, 100 mg, Intravenous,  Q8H, Calvert Cantor, MD, 100 mg at 03/16/23 1004   levothyroxine (SYNTHROID) tablet 150 mcg, 150 mcg, Oral, Q0600, Calvert Cantor, MD, 150 mcg at 03/16/23 0527   LORazepam (ATIVAN) injection 1 mg, 1 mg, Intravenous, Q8H PRN, Calvert Cantor, MD, 1 mg at 03/16/23 1610   morphine (PF) 4 MG/ML injection 4 mg, 4 mg, Intravenous, Q3H PRN, Calvert Cantor, MD, 4 mg at 03/16/23 1217   ondansetron (ZOFRAN) tablet 4 mg, 4 mg, Oral, Q6H PRN **OR** ondansetron (ZOFRAN) injection 4 mg, 4 mg, Intravenous, Q6H PRN, Opyd, Lavone Neri, MD, 4 mg at 03/16/23 1004   oxybutynin (DITROPAN-XL) 24 hr tablet 5 mg, 5 mg, Oral, QHS, Rizwan, Saima, MD, 5 mg at 03/15/23 2249   oxyCODONE (Oxy IR/ROXICODONE) immediate release tablet 5 mg, 5 mg, Oral, QID, Rizwan, Saima, MD, 5 mg at 03/16/23 1005   pantoprazole (PROTONIX) injection 40 mg, 40 mg, Intravenous, Q12H, Rizwan, Saima, MD, 40 mg at 03/16/23 1004   promethazine (PHENERGAN) 6.25 mg/NS 50 mL IVPB, 6.25 mg, Intravenous, Q6H PRN, Opyd, Lavone Neri, MD, Stopped at 03/15/23 1120   senna (SENOKOT) tablet 17.2 mg, 2 tablet, Oral, Daily, Rizwan, Saima, MD, 17.2 mg at 03/16/23 1004   sodium chloride flush (NS) 0.9 %  injection 3 mL, 3 mL, Intravenous, Q12H, Opyd, Timothy S, MD, 3 mL at 03/16/23 1000   zolpidem (AMBIEN) tablet 5 mg, 5 mg, Oral, QHS, Rizwan, Saima, MD, 5 mg at 03/15/23 2249  Allergies: Allergies  Allergen Reactions   Bee Venom Anaphylaxis and Swelling    Swelling of face, throat. Airway restriction.   Coreg [Carvedilol] Shortness Of Breath and Other (See Comments)    Triggers asthma   Red Dye #40 (Allura Red) Other (See Comments)    Migraines   Compazine [Prochlorperazine Maleate] Other (See Comments)    Twitching Able to tolerate promethazine   Oxycodone     Nausea/vomiting   Vicodin [Hydrocodone-Acetaminophen] Itching, Nausea And Vomiting and Other (See Comments)    OK with Tylenol   Cymbalta [Duloxetine Hcl] Other (See Comments)    Irritability  Syncope    Ditropan [Oxybutynin] Other (See Comments)    Severe dry mouth   Elavil [Amitriptyline] Other (See Comments)    Irritability    Motrin [Ibuprofen] Other (See Comments)    Heartburn    Naprosyn [Naproxen] Other (See Comments)    Heartburn    Pyrimethamine Other (See Comments)    From Fansidar combination drug Triggers asthma   Sulfadoxine Other (See Comments)    From Fansidar combination drug Triggers asthma    Zestril [Lisinopril] Cough   Nifedical Xl [Nifedipine] Nausea And Vomiting   Reglan [Metoclopramide] Swelling and Other (See Comments)    Restlessness Able to take with Benadryl   Trandate [Labetalol] Nausea And Vomiting and Other (See Comments)    Triggers asthma     Krystal [name]

## 2023-03-17 LAB — BASIC METABOLIC PANEL
Anion gap: 12 (ref 5–15)
BUN: 8 mg/dL (ref 6–20)
CO2: 25 mmol/L (ref 22–32)
Calcium: 9 mg/dL (ref 8.9–10.3)
Chloride: 103 mmol/L (ref 98–111)
Creatinine, Ser: 0.41 mg/dL — ABNORMAL LOW (ref 0.44–1.00)
GFR, Estimated: >60 mL/min (ref >60)
Glucose, Bld: 110 mg/dL — ABNORMAL HIGH (ref 70–99)
Potassium: 3.2 mmol/L — ABNORMAL LOW (ref 3.5–5.1)
Sodium: 140 mmol/L (ref 135–145)

## 2023-03-17 MED ORDER — MORPHINE SULFATE (PF) 2 MG/ML IV SOLN
2.0000 mg | INTRAVENOUS | Status: DC | PRN
  Administered 2023-03-17 – 2023-03-21 (×19): 2 mg via INTRAVENOUS
  Filled 2023-03-17 (×19): qty 1

## 2023-03-17 MED ORDER — OLANZAPINE 5 MG PO TBDP
5.0000 mg | ORAL_TABLET | Freq: Every day | ORAL | Status: DC
Start: 2023-03-17 — End: 2023-03-18
  Administered 2023-03-17: 5 mg via ORAL
  Filled 2023-03-17 (×2): qty 1

## 2023-03-17 MED ORDER — LORAZEPAM 2 MG/ML IJ SOLN
0.5000 mg | Freq: Three times a day (TID) | INTRAMUSCULAR | Status: DC | PRN
  Administered 2023-03-17 – 2023-03-18 (×2): 0.5 mg via INTRAVENOUS
  Filled 2023-03-17 (×2): qty 1
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
## 2023-03-17 NOTE — Progress Notes (Signed)
[facility] Progress Note  Patient: Krystal [name]     SWN:[id number]  DOA: 03/12/2023   PCP: Patient, No Pcp Per       Brief hospital course: This is a 33 year old female with history of chronic abdominal/pelvic pain, depression, anxiety, fibromyalgia, adrenal insufficiency and hypertension who presented to the ED hospital abdominal pain along with nausea and vomiting.  She states that the nausea and vomiting started at the same time as the pain and she feels that it is precipitated by the pain.  Most of her pain is in the suprapubic area but after she began to have vomiting she had some left lower quadrant pain radiating to her back as well. CT abdomen pelvis, pelvic Doppler and pelvic ultrasound in the ED unremarkable. CT renal stone study on 1/13 was also negative UA did not show any infection   Urine pregnancy negative   Subjective:  Mostly pain when urinating now. Still vomiting up meals. We have discussed weaning narcotics today, specifically Morphine. She agrees. She also agrees to try to take less Ativan as psych increased meds for her mood. We will be reducing IVF as she is urinating quite a bit now.   Assessment and Plan: Principal Problem:   Intractable nausea and vomiting -Suspected now that it is secondary to her anxiety - Will continue antiemetics  - decrease IVF from 125 to 75 cc/hr - Protonix started as well - regular diet as tolerated   Active Problems:  Pelvic and abdominal pain -Suprapubic pain is likely her chronic pain- Apparently she has issues with chronic pain interstitial cystitis with occasional exacerbations -Pain that is in her lower abdomen and radiating to her back may be muscular - At this time she does not appear to have a urinary tract infection and there is no imaging revealing other etiology - -She has been evaluated by OB/GYN and GI in the past -Discussed plan with urology who recommends that she follow-up with her outpatient  urologist who manages her interstitial cystitis-she has an appointment for February 9-in the meantime I have been trying to control her pain while she is in the hospital -It appears she received a prescription for oxycodone on 1/13-will not need to order narcotics on DC -She is agreeable to start weaning narcotics-wean morphine from 4 to 2 mg and continue oxycodone as is for today   Anxiety/depression -Appears to be severely depressed as she states her life has changed significantly because of her chronic pain -Having significant marital issues and will not be going back home in the hospital - per RN, she cries constantly  -Appreciate psych eval -States she was taking Prozac at home which is being continued - Zyprexa started by psychiatry  - attempt to wean IV Ativan  Adrenal insufficiency - IV hydrocortisone being given and Cortef on hold due to vomiting  Hypertension - Resume amlodipine -Suspect great deal of elevated blood pressure is due to her pain and anxiety  - Hypothyroidism - cont Synthroid   Left hemorrhagic ovarian cyst 04/22/2022 - Status post laparoscopic lysis of adhesions  UDS + for Benzo, narcotics and Marijuana - on 1/13- received Ativan and Dilaudid which explains the Benzos and Narcotics on UDS -She was taking some delta 8 Gummies at home to see if it would help her pain      Code Status: Full Code Total time on patient care: 30 minutes DVT prophylaxis:  Lovenox     Objective:   Vitals:   03/16/23 1339 03/16/23 2204  03/17/23 0605 03/17/23 1308  BP: (!) 194/140 (!) 147/101 (!) 168/112 (!) 188/114  Pulse: 97 68 61 78  Resp: 18 15 20 18   Temp: 98.9 F (37.2 C) 97.7 F (36.5 C) (!) 97.5 F (36.4 C) 98.1 F (36.7 C)  TempSrc: Oral  Oral Oral  SpO2: 98% 98% 100% 100%  Weight:      Height:       Filed Weights   03/13/23 1522  Weight: 101.2 kg   Exam: General exam: Appears uncomfortable- crying- HEENT: oral mucosa moist Respiratory system: Clear  to auscultation.  Cardiovascular system: S1 & S2 heard  Gastrointestinal system: Abdomen soft, tender across lower abdomen and left upper abdomen. Normal bowel sounds   Extremities: No cyanosis, clubbing or edema  CBC: Recent Labs  Lab 03/12/23 1610 03/13/23 0543 03/14/23 0547  WBC 7.2 7.9 7.4  NEUTROABS 4.2  --   --   HGB 15.7* 14.5 14.3  HCT 47.4* 44.2 42.5  MCV 93.3 96.1 94.4  PLT 285 323 292   Basic Metabolic Panel: Recent Labs  Lab 03/12/23 1301 03/13/23 0543 03/14/23 0547 03/15/23 0514 03/17/23 0834  NA 139 139 138 138 140  K 3.9 4.0 3.7 3.6 3.2*  CL 103 108 107 105 103  CO2 22 24 27 26 25   GLUCOSE 84 88 112* 106* 110*  BUN 21* 18 12 5* 8  CREATININE 0.79 0.87 0.74 0.74 0.41*  CALCIUM 9.8 8.5* 8.7* 8.7* 9.0     Scheduled Meds:  amLODipine  10 mg Oral Daily   Chlorhexidine Gluconate Cloth  6 each Topical Daily   enoxaparin (LOVENOX) injection  50 mg Subcutaneous Q24H   FLUoxetine  40 mg Oral Daily   fluticasone furoate-vilanterol  1 puff Inhalation Daily   hydrocortisone sod succinate (SOLU-CORTEF) inj  100 mg Intravenous Q8H   levothyroxine  150 mcg Oral Q0600   OLANZapine zydis  5 mg Oral QHS   oxybutynin  5 mg Oral QHS   oxyCODONE  5 mg Oral QID   pantoprazole  40 mg Oral BID   senna  2 tablet Oral Daily   sodium chloride flush  3 mL Intravenous Q12H   zolpidem  5 mg Oral QHS    Imaging and lab data personally reviewed   Author: Calvert Cantor  03/17/2023 5:25 PM  To contact [facility]>   Check the care team in [facility] and look for the attending/consulting [facility] provider listed  Log into [email or web address] and use [place]'s universal password   Go to> "[facility]"  and find provider  If you still have difficulty reaching the provider, please page the [facility] (Director on Call) for the Hospitalists listed on amion

## 2023-03-17 NOTE — Consult Note (Signed)
[facility] Psychiatric Consult Initial  Patient Name: .Krystal [name]  MRN: [id number]  DOB: 1990-11-05  Consult Order details:  severe anxiety/Dr. Candee Furbish of Visit: In person    Psychiatry Consult Evaluation  Service Date: March 17, 2023 LOS:  LOS: 4 days  Chief Complaint "I can't keep going like this"  Primary Psychiatric Diagnoses  Mdd/recurrent/severe 2.  GAD 3. R/o somatic sx d/o  Assessment  Krystal [name] is a 33 y.o. female admitted: Medicallyfor 03/12/2023 11:37 AM for pelvic pain. She carries the psychiatric diagnoses of depression and anxiety and has a past medical history of  hypothyroidism, HTN, intractable vomiting, and interstitial cystitis.   I am actually very familiar with this pt from prior prolonged admission for pregnancy complications - was treating for depression and anxiety. Historically sx are much worse in inpt medical setting due to hx medical trauma.  She had done fairly well as an outpt with Dr. Mercy Riding for some time (moved from venlafaxine to fluoxetine) but was lot to followup when her insurance changed.   Her current presentation of hysterical crying and worsening of physical sx of N+V, bladder pain is most consistent with known diagnoses of depression and anxiety; there is some concern for a bidirectional relationship between physical sx and mental health (ie somatic sx d/o) based on timeline of events and pt report.  Current outpatient psychotropic medications include prozac 40 mg and historically she has had a fair to good response to these medications, but only when also receiving therapy. She has been out of therapy for several months due to insurance issues. She was compliant with medications prior to admission as evidenced by pt report. On initial examination, patient was continually crying through exam and briefly interrupted exam to vomit. She was marginally improved on repeat exam 1/21. Please see plan below for detailed  recommendations.   Diagnoses:  Active Hospital problems: Principal Problem:   Intractable nausea and vomiting Active Problems:   Anxiety   Hypothyroidism due to Hashimoto's thyroiditis   Resistant hypertension   MDD (major depressive disorder), recurrent episode, severe (HCC)   Moderate persistent asthma with acute exacerbation   Adrenal insufficiency (HCC)   Chronic interstitial cystitis   Intractable vomiting with nausea    Plan   ## Psychiatric Medication Recommendations:  -- c fluoxteine 40 mg -- inc olanzapine to 5  mg at bedtime and continue 2.5 mg TID PRN anxiety/nausea also adjunctive for depression  ## Medical Decision Making Capacity: Not specifically addressed in this encounter  ## Further Work-up:  -- none currently  While pt on Qtc prolonging medications, please monitor & replete K+ to 4 and Mg2+ to 2 -- most recent EKG on 1/17 had QtC of 433 -- Pertinent labwork reviewed earlier this admission includes: UDS + Opiates,  BZD, THC   ## Disposition:-- There are no psychiatric contraindications to discharge at this time  ## Behavioral / Environmental: - Patients with borderline personality traits/disorder often use the language of physical pain to communicate both physical and emotional suffering. It is important to address pain complaints as they arise and attempt to identify an etiology, either organic or psychiatric. In patients with chronic pain, it is important to have a discussion with the patient about expectations about pain control. or Utilize compassion and acknowledge the patient's experiences while setting clear and realistic expectations for care.    ## Safety and Observation Level:  - Based on my clinical evaluation, I estimate the patient to be at low  risk of self harm in the current setting. - At this time, we recommend  routine. This decision is based on my review of the chart including patient's history and current presentation, interview of the  patient, mental status examination, and consideration of suicide risk including evaluating suicidal ideation, plan, intent, suicidal or self-harm behaviors, risk factors, and protective factors. This judgment is based on our ability to directly address suicide risk, implement suicide prevention strategies, and develop a safety plan while the patient is in the clinical setting. Please contact our team if there is a concern that risk level has changed.  CSSR Risk Category:C-SSRS RISK CATEGORY: No Risk  Suicide Risk Assessment: Patient has following modifiable risk factors for suicide: under treated depression  and lack of access to outpatient mental health resources, which we are addressing by medication changes and recommending low-cost therapy options Patient has following non-modifiable or demographic risk factors for suicide: separation or divorce and history of suicide attempt Patient has the following protective factors against suicide: Supportive family and Minor children in the home  Thank you for this consult request. Recommendations have been communicated to the primary team.  We will continue to follow at this time.   Hether Anselmo A Rachael Zapanta       History of Present Illness  Relevant Aspects of [facility] Course:  Admitted on 03/12/2023 for pelvic pain. They have been crying uncontrollably through much of admission.   Patient Report:  Pt seen in AM. She presents similarly to yesterday - still crying, endorsing feeling "stuck", etc. Very good insight into bidirectional nature of pain/anxiety estimates "50%" of her pain is from her mental health. Has improved in a couple of main areas:  1) Smiling briefly (not crying continually) 2) Able to tolerate making future plans - goal today is to contact Family Services 3) Able to acknowledge struggles without thinking she is a "bad mom" (one of her main issues last admission)  Remains future oriented throughout interivew. We discussed  inpt psych - pt doesn't really meet criteria (needs therapy and shelter resources > inpt psych) and does not want to go. Spent a fair amt of time discussing that she is getting therapeutic zyprexa even if she throws up after d/t ODT. No SI, HI, AH/VH. Spoke to sister and friend at bedside with pt consent. Briefly discussed remeron - pt apparently on this remotely with ill effect/   Psych ROS:  Partly truncated due to crying/vomiting Depression: low mood, worthlessness, constant crying, etc. Denies SI Anxiety:  as in HPI Mania (lifetime and current): denies Psychosis: (lifetime and current): denies  Collateral information:  Spoke to nursing staff/primary MD - crying contiually all day.   Review of Systems  Reason unable to perform ROS: bladder pain, nausea, vomiting most significant.     Psychiatric and Social History  Psychiatric History:  Information collected from pt, medical record  Prev Dx/Sx: MDD, anxiety, ADD Current Psych Provider: None (prev dr. 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 rate 18, height 5\' 11"  (1.803 m), weight 101.2 kg, SpO2 100%, not currently breastfeeding. Body mass index is 31.13 kg/m.  Physical Exam HENT:     Head: Normocephalic.  Pulmonary:     Effort: Pulmonary effort is normal.  Neurological:     Mental Status: She is alert.     Mental Status Exam: General Appearance:  in hospital attire  Orientation:  Full (Time, Place, and Person)  Memory:  Immediate;   Good Recent;   Fair Remote;   Good  Concentration:  Concentration: Good  Recall:  Good  Attention  Good  Eye Contact:  Good  Speech:   rapid not pressured  Language:  Good  Volume:  Normal  Mood: "I feel stuck"  Affect:  Congruent, Full Range, and Tearful -today brightens appropriately  Thought Process:  Coherent  Thought Content:   devoid of overt delusions/paranoia  Suicidal Thoughts:  No  Homicidal Thoughts:  No  Judgement:  Fair  Insight:  Good  Psychomotor Activity:  Increased (restless)  Akathisia:  No  Fund of Knowledge:  Good      Assets:  Communication Skills Desire for Improvement Housing Social Support Talents/Skills   Other History   These have been pulled in through the EMR, reviewed, and updated if appropriate.  Family History:  The patient's family history includes Atrial fibrillation in her father; Coronary artery disease in her mother; Heart attack in her maternal grandfather; Heart failure in her maternal grandfather; Hypertension in her father and mother; Stroke in her father.  Medical History: Past Medical History:  Diagnosis Date   Acute acalculous cholecystitis s/p lap cholecystectomy 05/11/2020 05/11/2020   Angio-edema 01/15/2021   Anxiety    Asthma    Depression    HELLP (hemolytic anemia/elev liver enzymes/low platelets in pregnancy)  12/08/2017   Hemorrhagic cyst of left ovary 04/15/2022   History of borderline personality disorder    History of HELLP syndrome, currently pregnant 04/21/2019   Formatting of this note might be different from the original.  History of HELLP syndrome at 26 weeks 5 days (12/07/2017); admitted to [facility] with BP 200/120s, Plt 52, AST/ALT 471/375  APS evaluation negative (04/2019)   Daily ASA 81 mg   Challenging blood pressure control in current pregnancy      Plan:  - Baseline pre-eclampsia labs this pregnancy within normal limits (Cr: 0.71; urine p:c 0.106)  -    IC (interstitial cystitis)    Kidney stones    Migraines    Necrotizing fasciitis (HCC)    left hand amputation   PTSD (post-traumatic stress disorder)    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   Severe uncontrolled hypertension 10/08/2017   UTI (urinary tract infection)     Surgical History: Past Surgical History:  Procedure Laterality Date   APPENDECTOMY     BIOPSY  05/10/2020   Procedure: BIOPSY;  Surgeon: Willis Modena, MD;  Location: [facility] ENDOSCOPY;  Service: Endoscopy;;   BIOPSY  05/02/2022   Procedure: BIOPSY;  Surgeon: Benancio Deeds, MD;  Location: [facility] ENDOS[facility];  Service: Gastroenterology;;   CESAREAN SECTION N/A 12/06/2017   Procedure: CESAREAN SECTION;  Surgeon: Conan Bowens, MD;  [facility];  Service: Obstetrics;  Laterality: N/A;   CESAREAN SECTION  07/21/2021   Procedure: CESAREAN SECTION;  Surgeon: Reva Bores, MD;  Location: [facility] LD ORS;  Service: Obstetrics;;   CHOLECYSTECTOMY N/A 05/11/2020   Procedure: SINGLE SITE LAPAROSCOPIC CHOLECYSTECTOMY AND LIVER BIOSY;  Surgeon: Karie Soda, MD;  Location: [facility] ORS;  Service: General;  Laterality: N/A;   ESOPHAGOGASTRODUODENOSCOPY (EGD) WITH PROPOFOL N/A 05/10/2020   Procedure: ESOPHAGOGASTRODUODENOSCOPY (EGD) WITH PROPOFOL;  Surgeon: Willis Modena, MD;  Location: [facility];  Service: Endoscopy;  Laterality: N/A;   FLEXIBLE SIGMOIDOSCOPY N/A 05/02/2022    Procedure: FLEXIBLE SIGMOIDOSCOPY;  Surgeon: Benancio Deeds, MD;  Location: [facility] ENDOSCOPY;  Service: Gastroenterology;  Laterality: N/A;   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   HAND RECONSTRUCTION Right    INCISION AND DRAINAGE     LAPAROSCOPIC LYSIS OF ADHESIONS  04/22/2022   Procedure: LAPAROSCOPIC LYSIS OF ADHESIONS;  Surgeon: Richland Bing, MD;  Location: [facility] OR;  Service: Gynecology;;   LAPAROSCOPIC OVARIAN CYSTECTOMY Left 04/22/2022   Procedure: LAPAROSCOPIC LEFT OVARIAN CYSTECTOMY;  Surgeon: [place] Bing, MD;  Location: [facility] OR;  Service: Gynecology;  Laterality: Left;   NASAL SEPTUM SURGERY       Medications:   Current Facility-Administered Medications:    0.9 %  sodium chloride infusion, , Intravenous, Continuous, Rizwan, Saima, MD, Last Rate: 75 mL/hr at 03/17/23 1320, Rate Change at 03/17/23 1320   acetaminophen (TYLENOL) tablet 650 mg, 650 mg, Oral, Q6H PRN, 650 mg at 03/16/23 0808 **OR** acetaminophen (TYLENOL) suppository 650 mg, 650 mg, Rectal, Q6H PRN, Opyd, Timothy S, MD   albuterol (PROVENTIL) (2.5 MG/3ML) 0.083% nebulizer solution 2.5 mg, 2.5 mg, Nebulization, Q4H PRN, Opyd, Lavone Neri, MD   amLODipine (NORVASC) tablet 10 mg, 10 mg, Oral, Daily, Rizwan, Saima, MD, 10 mg at 03/17/23 0948   Chlorhexidine Gluconate Cloth 2 % PADS 6 each, 6 each, Topical, Daily, Calvert Cantor, MD, 6 each at 03/17/23 0951   diphenhydrAMINE (BENADRYL) capsule 25 mg, 25 mg, Oral, Q6H PRN **OR** diphenhydrAMINE (BENADRYL) injection 25 mg, 25 mg, Intravenous, Q6H PRN, Bell, Michelle T, RPH, 25 mg at 03/17/23 1335   enoxaparin (LOVENOX) injection 50 mg, 50 mg, Subcutaneous, Q24H, Opyd, Lavone Neri, MD, 50 mg at 03/17/23 0950   FLUoxetine (PROZAC) capsule 40 mg, 40 mg, Oral, Daily, Rizwan, Saima, MD, 40 mg at 03/17/23 0948   fluticasone furoate-vilanterol (BREO ELLIPTA) 100-25 MCG/ACT 1 puff, 1 puff, Inhalation, Daily, Rizwan, Saima, MD, 1 puff at 03/17/23 0731   hydrALAZINE  (APRESOLINE) injection 10 mg, 10 mg, Intravenous, Q6H PRN, Butler Denmark, Saima, MD, 10 mg at 03/17/23 1329   hydrocerin (EUCERIN) cream, , Topical, Q3H PRN, Calvert Cantor, MD   hydrocortisone sodium succinate (SOLU-CORTEF) 100 MG injection 100 mg, 100 mg, Intravenous, Q8H, Rizwan, Saima, MD, 100 mg at 03/17/23 0955   levothyroxine (SYNTHROID) tablet 150 mcg, 150 mcg, Oral, Q0600, Calvert Cantor, MD, 150 mcg at 03/17/23 0511   LORazepam (ATIVAN) injection 0.5-1 mg, 0.5-1 mg, Intravenous, Q8H PRN, Rizwan, Saima, MD, 0.5 mg at 03/17/23 1615   morphine (PF) 2 MG/ML injection 2 mg, 2 mg, Intravenous, Q3H PRN, Calvert Cantor, MD, 2 mg at 03/17/23 1613   OLANZapine (ZYPREXA) tablet 2.5 mg, 2.5 mg, Oral, TID PRN, Glorie Dowlen A, 2.5 mg at 03/16/23 1439   OLANZapine zydis (  ZYPREXA) disintegrating tablet 5 mg, 5 mg, Oral, QHS, Leighton Luster A   ondansetron (ZOFRAN) tablet 4 mg, 4 mg, Oral, Q6H PRN **OR** ondansetron (ZOFRAN) injection 4 mg, 4 mg, Intravenous, Q6H PRN, Opyd, Lavone Neri, MD, 4 mg at 03/17/23 0701   oxybutynin (DITROPAN-XL) 24 hr tablet 5 mg, 5 mg, Oral, QHS, Rizwan, Saima, MD, 5 mg at 03/16/23 2131   oxyCODONE (Oxy IR/ROXICODONE) immediate release tablet 5 mg, 5 mg, Oral, QID, Rizwan, Saima, MD, 5 mg at 03/17/23 1320   pantoprazole (PROTONIX) EC tablet 40 mg, 40 mg, Oral, BID, Herby Abraham, RPH, 40 mg at 03/17/23 0948   promethazine (PHENERGAN) 6.25 mg/NS 50 mL IVPB, 6.25 mg, Intravenous, Q6H PRN, Opyd, Lavone Neri, MD, Last Rate: 150 mL/hr at 03/17/23 0857, 6.25 mg at 03/17/23 0857   senna (SENOKOT) tablet 17.2 mg, 2 tablet, Oral, Daily, Rizwan, Saima, MD, 17.2 mg at 03/17/23 0948   sodium chloride flush (NS) 0.9 % injection 3 mL, 3 mL, Intravenous, Q12H, Opyd, Timothy S, MD, 3 mL at 03/17/23 0949   zolpidem (AMBIEN) tablet 5 mg, 5 mg, Oral, QHS, Rizwan, Saima, MD, 5 mg at 03/16/23 2131  Allergies: Allergies  Allergen Reactions   Bee Venom Anaphylaxis and Swelling    Swelling of  face, throat. Airway restriction.   Coreg [Carvedilol] Shortness Of Breath and Other (See Comments)    Triggers asthma   Red Dye #40 (Allura Red) Other (See Comments)    Migraines   Compazine [Prochlorperazine Maleate] Other (See Comments)    Twitching Able to tolerate promethazine   Oxycodone     Nausea/vomiting   Vicodin [Hydrocodone-Acetaminophen] Itching, Nausea And Vomiting and Other (See Comments)    OK with Tylenol   Cymbalta [Duloxetine Hcl] Other (See Comments)    Irritability  Syncope    Ditropan [Oxybutynin] Other (See Comments)    Severe dry mouth   Elavil [Amitriptyline] Other (See Comments)    Irritability    Motrin [Ibuprofen] Other (See Comments)    Heartburn    Naprosyn [Naproxen] Other (See Comments)    Heartburn    Pyrimethamine Other (See Comments)    From Fansidar combination drug Triggers asthma   Sulfadoxine Other (See Comments)    From Fansidar combination drug Triggers asthma    Zestril [Lisinopril] Cough   Nifedical Xl [Nifedipine] Nausea And Vomiting   Reglan [Metoclopramide] Swelling and Other (See Comments)    Restlessness Able to take with Benadryl   Trandate [Labetalol] Nausea And Vomiting and Other (See Comments)    Triggers asthma     Claris Che A Josiel Gahm

## 2023-03-18 LAB — BASIC METABOLIC PANEL
Anion gap: 12 (ref 5–15)
BUN: 15 mg/dL (ref 6–20)
CO2: 24 mmol/L (ref 22–32)
Calcium: 9 mg/dL (ref 8.9–10.3)
Chloride: 104 mmol/L (ref 98–111)
Creatinine, Ser: 0.61 mg/dL (ref 0.44–1.00)
GFR, Estimated: >60 mL/min (ref >60)
Glucose, Bld: 107 mg/dL — ABNORMAL HIGH (ref 70–99)
Potassium: 3.5 mmol/L (ref 3.5–5.1)
Sodium: 140 mmol/L (ref 135–145)

## 2023-03-18 MED ORDER — OLANZAPINE 5 MG PO TBDP
7.5000 mg | ORAL_TABLET | Freq: Every day | ORAL | Status: DC
Start: 2023-03-18 — End: 2023-03-21
  Administered 2023-03-18 – 2023-03-20 (×3): 7.5 mg via ORAL
  Filled 2023-03-18 (×3): qty 1.5

## 2023-03-18 MED ORDER — LORAZEPAM 2 MG/ML IJ SOLN
0.5000 mg | Freq: Three times a day (TID) | INTRAMUSCULAR | Status: AC | PRN
  Administered 2023-03-19 – 2023-03-20 (×2): 1 mg via INTRAVENOUS
  Filled 2023-03-18 (×2): qty 1

## 2023-03-18 MED ORDER — PHENAZOPYRIDINE HCL 100 MG PO TABS: 100.0000 mg | ORAL_TABLET | Freq: Three times a day (TID) | ORAL | Status: DC | PRN

## 2023-03-18 NOTE — Consult Note (Signed)
Krystal [name]  MRN: [id number]  DOB: 1990-03-03  Consult Order details:  severe anxiety/Dr. Candee Furbish of Visit: In person    Psychiatry Consult Evaluation  Service Date: March 18, 2023 LOS:  LOS: 5 days  Chief Complaint "I can't keep going like this"  Primary Psychiatric Diagnoses  Mdd/recurrent/severe 2.  GAD 3. R/o somatic sx d/o  Assessment  Krystal [name] is a 33 y.o. female admitted: Medicallyfor 03/12/2023 11:37 AM for pelvic pain. She carries the psychiatric diagnoses of depression and anxiety and has a past medical history of  hypothyroidism, HTN, intractable vomiting, and interstitial cystitis.   I am actually very familiar with this pt from prior prolonged admission for pregnancy complications - was treating for depression and anxiety. Historically sx are much worse in inpt medical setting due to hx medical trauma.  She had done fairly well as an outpt with Dr. Mercy Riding for some time (moved from venlafaxine to fluoxetine) but was lot to followup when her insurance changed.   Her current presentation of hysterical crying and worsening of physical sx of N+V, bladder pain is most consistent with known diagnoses of depression and anxiety; there is some concern for a bidirectional relationship between physical sx and mental health (ie somatic sx d/o) based on timeline of events and pt report.  Current outpatient psychotropic medications include prozac 40 mg and historically she has had a fair to good response to these medications, but only when also receiving therapy. She has been out of therapy for several months due to insurance issues. She was compliant with medications prior to admission as evidenced by pt report. On initial examination, patient was continually crying through exam and briefly interrupted exam to vomit. She was marginally improved on repeat exam 1/21 and similar on 1/22. Please see plan below for  detailed recommendations.   Diagnoses:  Active Hospital problems: Principal Problem:   Intractable nausea and vomiting Active Problems:   Anxiety   Hypothyroidism due to Hashimoto's thyroiditis   Resistant hypertension   MDD (major depressive disorder), recurrent episode, severe (HCC)   Moderate persistent asthma with acute exacerbation   Adrenal insufficiency (HCC)   Chronic interstitial cystitis   Intractable vomiting with nausea    Plan   ## Psychiatric Medication Recommendations:  -- c fluoxteine 40 mg -- inc olanzapine to 7.5  mg at bedtime and continue 2.5 mg TID PRN anxiety/nausea also adjunctive for depression  ## Medical Decision Making Capacity: Not specifically addressed in this encounter  ## Further Work-up:  -- none currently  While pt on Qtc prolonging medications, please monitor & replete K+ to 4 and Mg2+ to 2 -- most recent EKG on 1/17 had QtC of 433 -- Pertinent labwork reviewed earlier this admission includes: UDS + Opiates,  BZD, THC   ## Disposition:-- There are no psychiatric contraindications to discharge at this time  ## Behavioral / Environmental: - Patients with borderline personality traits/disorder often use the language of physical pain to communicate both physical and emotional suffering. It is important to address pain complaints as they arise and attempt to identify an etiology, either organic or psychiatric. In patients with chronic pain, it is important to have a discussion with the patient about expectations about pain control. or Utilize compassion and acknowledge the patient's experiences while setting clear and realistic expectations for care.    ## Safety and Observation Level:  - Based on my clinical evaluation, I estimate the patient  to be at low risk of self harm in the current setting. - At this time, we recommend  routine. This decision is based on my review of the chart including patient's history and current presentation, interview  of the patient, mental status examination, and consideration of suicide risk including evaluating suicidal ideation, plan, intent, suicidal or self-harm behaviors, risk factors, and protective factors. This judgment is based on our ability to directly address suicide risk, implement suicide prevention strategies, and develop a safety plan while the patient is in the clinical setting. Please contact our team if there is a concern that risk level has changed.  CSSR Risk Category:C-SSRS RISK CATEGORY: No Risk  Suicide Risk Assessment: Patient has following modifiable risk factors for suicide: under treated depression  and lack of access to outpatient mental health resources, which we are addressing by medication changes and recommending low-cost therapy options Patient has following non-modifiable or demographic risk factors for suicide: separation or divorce and history of suicide attempt Patient has the following protective factors against suicide: Supportive family and Minor children in the home  Thank you for this consult request. Recommendations have been communicated to the primary team.  We will continue to follow at this time.   Brookes Craine A Zoha Spranger       History of Present Illness  Relevant Aspects of [facility] Course:  Admitted on 03/12/2023 for pelvic pain. They have been crying uncontrollably through much of admission.   Patient Report:  Pt seen in PM. About the same as yesterday - intermittently crying but continues to improve in ability to think about future and plan. We discussed outpt options including partial which has been helpful in the past.. Remains future oriented throughout interview - hoping pain gets better. Staying with husband for now - discusses "love and care" they have for each other an dhoping that things will get easier once kids in school.Has not been utilizing PRN olanzapine. No SI, HI, AH/VH.  Psych ROS:  Partly truncated due to  crying/vomiting Depression: low mood, worthlessness, constant crying, etc. Denies SI Anxiety:  as in HPI Mania (lifetime and current): denies Psychosis: (lifetime and current): denies  Collateral information:  Spoke to nursing staff/primary MD - crying contiually all day.   Review of Systems  Reason unable to perform ROS: bladder pain, nausea, vomiting most significant.     Psychiatric and Social History  Psychiatric History:  Information collected from pt, medical record  Prev Dx/Sx: MDD, anxiety, ADD Current Psych Provider: None (prev dr. 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 rate 18, height 5\' 11"  (1.803 m), weight 101.2 kg, SpO2 98%, not currently breastfeeding. Body mass index is 31.13 kg/m.  Physical Exam HENT:     Head: Normocephalic.  Pulmonary:     Effort: Pulmonary effort is normal.   Neurological:     Mental Status: She is alert.     Mental Status Exam: General Appearance:  in hospital attire  Orientation:  Full (Time, Place, and Person)  Memory:  Immediate;   Good Recent;   Fair Remote;   Good  Concentration:  Concentration: Good  Recall:  Good  Attention  Good  Eye Contact:  Good  Speech:  Clear and Coherent  Language:  Good  Volume:  Normal  Mood: "I want to see my kids but I'm not ready to go home"  Affect:  Congruent, Full Range, and Tearful -today brightens appropriately  Thought Process:  Coherent  Thought Content:   devoid of overt delusions/paranoia  Suicidal Thoughts:  No  Homicidal Thoughts:  No  Judgement:  Fair  Insight:  Good  Psychomotor Activity:  Increased (restless)  Akathisia:  No  Fund of Knowledge:  Good      Assets:  Communication Skills Desire for Improvement Housing Social Support Talents/Skills   Other History   These have been pulled in through the EMR, reviewed, and updated if appropriate.  Family History:  The patient's family history includes Atrial fibrillation in her father; Coronary artery disease in her mother; Heart attack in her maternal grandfather; Heart failure in her maternal grandfather; Hypertension in her father and mother; Stroke in her father.  Medical History: Past Medical History:  Diagnosis Date   Acute acalculous cholecystitis s/p lap cholecystectomy 05/11/2020 05/11/2020   Angio-edema 01/15/2021   Anxiety    Asthma    Depression    HELLP (hemolytic anemia/elev liver enzymes/low platelets in pregnancy) 12/08/2017   Hemorrhagic cyst of left ovary 04/15/2022   History of borderline personality disorder    History of HELLP syndrome, currently pregnant 04/21/2019   Formatting of this note might be different from the original.  History of HELLP syndrome at 26 weeks 5 days (12/07/2017); admitted to [facility] with BP 200/120s, Plt 52, AST/ALT 471/375  APS evaluation negative (04/2019)   Daily ASA 81 mg    Challenging blood pressure control in current pregnancy      Plan:  - Baseline pre-eclampsia labs this pregnancy within normal limits (Cr: 0.71; urine p:c 0.106)  -    IC (interstitial cystitis)    Kidney stones    Migraines    Necrotizing fasciitis (HCC)    left hand amputation   PTSD (post-traumatic stress disorder)    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   Severe uncontrolled hypertension 10/08/2017   UTI (urinary tract infection)     Surgical History: Past Surgical History:  Procedure Laterality Date   APPENDECTOMY     BIOPSY  05/10/2020   Procedure: BIOPSY;  Surgeon: Willis Modena, MD;  Location: [facility] ENDOSCOPY;  Service: Endoscopy;;   BIOPSY  05/02/2022   Procedure: BIOPSY;  Surgeon: Benancio Deeds, MD;  Location: [facility] ENDOSCOPY;  Service: Gastroenterology;;   CESAREAN SECTION N/A 12/06/2017   Procedure: CESAREAN SECTION;  Surgeon: Conan Bowens, MD;  Location: [facility] BIRTHING SUITES;  Service: Obstetrics;  Laterality: N/A;   CESAREAN SECTION  07/21/2021   Procedure: CESAREAN SECTION;  Surgeon: Reva Bores, MD;  Location: [facility] LD ORS;  Service: Obstetrics;;   CHOLECYSTECTOMY N/A 05/11/2020  Procedure: SINGLE SITE LAPAROSCOPIC CHOLECYSTECTOMY AND LIVER BIOSY;  Surgeon: Karie Soda, MD;  Location: [facility] ORS;  Service: General;  Laterality: N/A;   ESOPHAGOGASTRODUODENOSCOPY (EGD) WITH PROPOFOL N/A 05/10/2020   Procedure: ESOPHAGOGASTRODUODENOSCOPY (EGD) WITH PROPOFOL;  Surgeon: Willis Modena, MD;  Location: [facility] ENDOSCOPY;  Service: Endoscopy;  Laterality: N/A;   FLEXIBLE SIGMOIDOSCOPY N/A 05/02/2022   Procedure: FLEXIBLE SIGMOIDOSCOPY;  Surgeon: Benancio Deeds, MD;  Location: [facility] ENDOSCOPY;  Service: Gastroenterology;  Laterality: N/A;   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   HAND RECONSTRUCTION Right    INCISION AND DRAINAGE     LAPAROSCOPIC LYSIS OF ADHESIONS  04/22/2022   Procedure: LAPAROSCOPIC LYSIS OF ADHESIONS;  Surgeon: McCaskill Bing, MD;  Location: [facility] OR;   Service: Gynecology;;   LAPAROSCOPIC OVARIAN CYSTECTOMY Left 04/22/2022   Procedure: LAPAROSCOPIC LEFT OVARIAN CYSTECTOMY;  Surgeon:  Bing, MD;  Location: [facility] OR;  Service: Gynecology;  Laterality: Left;   NASAL SEPTUM SURGERY       Medications:   Current Facility-Administered Medications:    0.9 %  sodium chloride infusion, , Intravenous, Continuous, Rizwan, Saima, MD, Last Rate: 75 mL/hr at 03/17/23 1729, New Bag at 03/17/23 1729   acetaminophen (TYLENOL) tablet 650 mg, 650 mg, Oral, Q6H PRN, 650 mg at 03/16/23 0808 **OR** acetaminophen (TYLENOL) suppository 650 mg, 650 mg, Rectal, Q6H PRN, Opyd, Timothy S, MD   albuterol (PROVENTIL) (2.5 MG/3ML) 0.083% nebulizer solution 2.5 mg, 2.5 mg, Nebulization, Q4H PRN, Opyd, Lavone Neri, MD   amLODipine (NORVASC) tablet 10 mg, 10 mg, Oral, Daily, Rizwan, Saima, MD, 10 mg at 03/18/23 1610   Chlorhexidine Gluconate Cloth 2 % PADS 6 each, 6 each, Topical, Daily, Calvert Cantor, MD, 6 each at 03/18/23 0830   diphenhydrAMINE (BENADRYL) capsule 25 mg, 25 mg, Oral, Q6H PRN **OR** diphenhydrAMINE (BENADRYL) injection 25 mg, 25 mg, Intravenous, Q6H PRN, Bell, Michelle T, RPH, 25 mg at 03/18/23 1143   enoxaparin (LOVENOX) injection 50 mg, 50 mg, Subcutaneous, Q24H, Opyd, Lavone Neri, MD, 50 mg at 03/18/23 0829   FLUoxetine (PROZAC) capsule 40 mg, 40 mg, Oral, Daily, Rizwan, Saima, MD, 40 mg at 03/18/23 0828   fluticasone furoate-vilanterol (BREO ELLIPTA) 100-25 MCG/ACT 1 puff, 1 puff, Inhalation, Daily, Rizwan, Ladell Heads, MD, 1 puff at 03/18/23 0835   hydrALAZINE (APRESOLINE) injection 10 mg, 10 mg, Intravenous, Q6H PRN, Calvert Cantor, MD, 10 mg at 03/17/23 1329   hydrocerin (EUCERIN) cream, , Topical, Q3H PRN, Calvert Cantor, MD   hydrocortisone sodium succinate (SOLU-CORTEF) 100 MG injection 100 mg, 100 mg, Intravenous, Q8H, Rizwan, Saima, MD, 100 mg at 03/18/23 1720   levothyroxine (SYNTHROID) tablet 150 mcg, 150 mcg, Oral, Q0600, Calvert Cantor, MD, 150 mcg  at 03/18/23 0552   LORazepam (ATIVAN) injection 0.5-1 mg, 0.5-1 mg, Intravenous, Q8H PRN, Kc, Ramesh, MD   morphine (PF) 2 MG/ML injection 2 mg, 2 mg, Intravenous, Q3H PRN, Calvert Cantor, MD, 2 mg at 03/18/23 1409   OLANZapine (ZYPREXA) tablet 2.5 mg, 2.5 mg, Oral, TID PRN, Haston Casebolt A, 2.5 mg at 03/18/23 1720   OLANZapine zydis (ZYPREXA) disintegrating tablet 7.5 mg, 7.5 mg, Oral, QHS, Jayland Null A   ondansetron (ZOFRAN) tablet 4 mg, 4 mg, Oral, Q6H PRN **OR** ondansetron (ZOFRAN) injection 4 mg, 4 mg, Intravenous, Q6H PRN, Opyd, Lavone Neri, MD, 4 mg at 03/18/23 0649   oxybutynin (DITROPAN-XL) 24 hr tablet 5 mg, 5 mg, Oral, QHS, Rizwan, Saima, MD, 5 mg at 03/17/23 2104   oxyCODONE (Oxy IR/ROXICODONE) immediate  release tablet 5 mg, 5 mg, Oral, QID, Rizwan, Saima, MD, 5 mg at 03/18/23 1720   pantoprazole (PROTONIX) EC tablet 40 mg, 40 mg, Oral, BID, Herby Abraham, RPH, 40 mg at 03/18/23 2841   promethazine (PHENERGAN) 6.25 mg/NS 50 mL IVPB, 6.25 mg, Intravenous, Q6H PRN, Opyd, Lavone Neri, MD, Last Rate: 150 mL/hr at 03/18/23 1454, 6.25 mg at 03/18/23 1454   senna (SENOKOT) tablet 17.2 mg, 2 tablet, Oral, Daily, Rizwan, Saima, MD, 17.2 mg at 03/18/23 3244   sodium chloride flush (NS) 0.9 % injection 3 mL, 3 mL, Intravenous, Q12H, Opyd, Timothy S, MD, 3 mL at 03/17/23 0949   zolpidem (AMBIEN) tablet 5 mg, 5 mg, Oral, QHS, Rizwan, Saima, MD, 5 mg at 03/17/23 2104  Allergies: Allergies  Allergen Reactions   Bee Venom Anaphylaxis and Swelling    Swelling of face, throat. Airway restriction.   Coreg [Carvedilol] Shortness Of Breath and Other (See Comments)    Triggers asthma   Red Dye #40 (Allura Red) Other (See Comments)    Migraines   Compazine [Prochlorperazine Maleate] Other (See Comments)    Twitching Able to tolerate promethazine   Oxycodone     Nausea/vomiting   Vicodin [Hydrocodone-Acetaminophen] Itching, Nausea And Vomiting and Other (See Comments)    OK with  Tylenol   Cymbalta [Duloxetine Hcl] Other (See Comments)    Irritability  Syncope    Ditropan [Oxybutynin] Other (See Comments)    Severe dry mouth   Elavil [Amitriptyline] Other (See Comments)    Irritability    Motrin [Ibuprofen] Other (See Comments)    Heartburn    Naprosyn [Naproxen] Other (See Comments)    Heartburn    Pyrimethamine Other (See Comments)    From Fansidar combination drug Triggers asthma   Sulfadoxine Other (See Comments)    From Fansidar combination drug Triggers asthma    Zestril [Lisinopril] Cough   Nifedical Xl [Nifedipine] Nausea And Vomiting   Reglan [Metoclopramide] Swelling and Other (See Comments)    Restlessness Able to take with Benadryl   Trandate [Labetalol] Nausea And Vomiting and Other (See Comments)    Triggers asthma     Claris Che A Amai Cappiello

## 2023-03-18 NOTE — Progress Notes (Signed)
PROGRESS NOTE Krystal [name]  AYT:[id number] DOB: Jul 23, 1990 DOA: 03/12/2023 PCP: Patient, No Pcp Per  Brief Narrative/Hospital Course: 30 yof with history of chronic abdominal/pelvic pain, depression, anxiety, fibromyalgia, adrenal insufficiency and hypertension who presented to the ED hospital abdominal pain along with nausea and vomiting.  She stated that the nausea and vomiting started at the same time as the pain and she feels that it is precipitated by the pain.  Most of her pain is in the suprapubic area but after she began to have vomiting she had some left lower quadrant pain radiating to her back as well. CT abdomen pelvis, pelvic Doppler and pelvic ultrasound in the ED unremarkable.CT renal stone study on 1/13 was also negative UA did not show any infection, Urine pregnancy negative. Patient admitted for intractable pelvic and abdominal pain likely chronic pain question with interstitial cystitis with occasional exacerbation.Discussed w/ urology,who advised follow-up with outpatient urologist has appointment in February 9. With her crying spells and anxiety depression seen by psychiatry> resuming fluoxetine 40 mg increase olanzapine to 5 mg bedtime and 2.5mg  tid prn.    Subjective: Patient tearful on entering the room Complains of ongoing lower abdominal discomfort, nausea and has been vomiting she states. She is asking if morphine can be switched to Dilaudid due to "itching"   Assessment and Plan: Principal Problem:   Intractable nausea and vomiting Active Problems:   Anxiety   Hypothyroidism due to Hashimoto's thyroiditis   Resistant hypertension   MDD (major depressive disorder), recurrent episode, severe (HCC)   Moderate persistent asthma with acute exacerbation   Adrenal insufficiency (HCC)   Chronic interstitial cystitis   Intractable vomiting with nausea   Pelvic and abdominal pain, likely chronic Chronic interstitial cystitis with occasional exacerbation Nausea  vomiting: Pain is felt to be chronic pain question with interstitial cystitis with occasional exacerbation.UA no evidence of UTI, CT abdomen pelvis pelvic Doppler pelvic ultrasound and CT renal stone unremarkable, LFTs lipase normal. Dr Ladell Heads discussed w/ urology,who advised follow-up with outpatient urologist has appointment in February 9. With her crying spells and anxiety depression seen by psychiatry> cont fluoxetine 40 mg increase olanzapine to 5 mg bedtime and 2.5mg  tid prn.  Encouraged to stay off of IV Ativan and will try to wean off.  Minimize IV opiates use> we discussed for possible discharge home tomorrow if doing well.  Continue antiemetics, gentle IV fluids, diet as tolerated on CLD.  Anxiety/depression: Input appreciated from psychiatry, having significant marital issues, with her chronic medical issues hypertension anxious tearful. Continue Zyprexa as needed per psychiatry wean off IV Ativan, continue fluctuating as above  Adrenal insufficiency: Continue IV hydrocortisone for now.  Hypothyroidism: Continue Synthroid  HTN: BP stable on amlodipine  UDS positive for benzo, narcotics and marijuana: on 113 received Ativan and Dilaudid which explains benzos and narcotics and UDS..  She reports taking some delta 8 Gummies at home to see if it helped her pain.  Left hemorrhagic ovarian cyst on 01/2023 status post laparoscopic LOA  Obesity: Patient's Body mass index is 31.13 kg/m. : Will benefit with PCP follow-up, weight loss  healthy lifestyle and outpatient sleep evaluation.   DVT prophylaxis:  Code Status:   Code Status: Full Code Family Communication: plan of care discussed with patient at bedside. Patient status is: Remains hospitalized because of severity of illness Level of care: Med-Surg   Dispo: The patient is from: home            Anticipated disposition: TBD Objective: Vitals last  24 hrs: Vitals:   03/17/23 1308 03/17/23 1728 03/17/23 2204 03/18/23 0533  BP:  (!) 188/114 (!) 165/88 (!) 163/107 135/85  Pulse: 78 63 74 60  Resp: 18 18 16 15   Temp: 98.1 F (36.7 C) 98.2 F (36.8 C) 98.7 F (37.1 C) 98.3 F (36.8 C)  TempSrc: Oral Oral Oral Oral  SpO2: 100% 98% 98% 98%  Weight:      Height:       Weight change:   Physical Examination: General exam: alert awake,at baseline, older than stated age HEENT:Oral mucosa moist, Ear/Nose WNL grossly Respiratory system: Bilaterally clear BS,no use of accessory muscle Cardiovascular system: S1 & S2 +, No JVD. Gastrointestinal system: Abdomen soft,NT,ND, BS+ Nervous System: Alert, awake, moving all extremities,and following commands. Extremities: LE edema neg,distal peripheral pulses palpable and warm.  Skin: No rashes,no icterus. MSK: Normal muscle bulk,tone, power   Medications reviewed:  Scheduled Meds:  amLODipine  10 mg Oral Daily   Chlorhexidine Gluconate Cloth  6 each Topical Daily   enoxaparin (LOVENOX) injection  50 mg Subcutaneous Q24H   FLUoxetine  40 mg Oral Daily   fluticasone furoate-vilanterol  1 puff Inhalation Daily   hydrocortisone sod succinate (SOLU-CORTEF) inj  100 mg Intravenous Q8H   levothyroxine  150 mcg Oral Q0600   OLANZapine zydis  5 mg Oral QHS   oxybutynin  5 mg Oral QHS   oxyCODONE  5 mg Oral QID   pantoprazole  40 mg Oral BID   senna  2 tablet Oral Daily   sodium chloride flush  3 mL Intravenous Q12H   zolpidem  5 mg Oral QHS   Continuous Infusions:  sodium chloride 75 mL/hr at 03/17/23 1729   promethazine (PHENERGAN) injection (IM or IVPB) 6.25 mg (03/17/23 1751)    Diet Order             Diet regular Room service appropriate? Yes; Fluid consistency: Thin  Diet effective now                   Intake/Output Summary (Last 24 hours) at 03/18/2023 1221 Last data filed at 03/18/2023 1000 Gross per 24 hour  Intake 3356.7 ml  Output 3550 ml  Net -193.3 ml   Net IO Since Admission: 1,556.19 mL [03/18/23 1221]  Wt Readings from Last 3 Encounters:   03/13/23 101.2 kg  03/09/23 101.2 kg  01/25/23 108.9 kg     Unresulted Labs (From admission, onward)     Start     Ordered   03/20/23 0500  Creatinine, serum  (enoxaparin (LOVENOX)    CrCl >/= 30 ml/min)  Weekly,   R     Comments: while on enoxaparin therapy    03/13/23 0038   03/18/23 1041  Basic metabolic panel  ONCE - URGENT,   URGENT        03/18/23 1041          Data Reviewed: I have personally reviewed following labs and imaging studies CBC: Recent Labs  Lab 03/12/23 1610 03/13/23 0543 03/14/23 0547  WBC 7.2 7.9 7.4  NEUTROABS 4.2  --   --   HGB 15.7* 14.5 14.3  HCT 47.4* 44.2 42.5  MCV 93.3 96.1 94.4  PLT 285 323 292   Basic Metabolic Panel:  Recent Labs  Lab 03/12/23 1301 03/13/23 0543 03/14/23 0547 03/15/23 0514 03/17/23 0834  NA 139 139 138 138 140  K 3.9 4.0 3.7 3.6 3.2*  CL 103 108 107 105 103  CO2 22  24 27 26 25   GLUCOSE 84 88 112* 106* 110*  BUN 21* 18 12 5* 8  CREATININE 0.79 0.87 0.74 0.74 0.41*  CALCIUM 9.8 8.5* 8.7* 8.7* 9.0   GFR: Estimated Creatinine Clearance: 129.8 mL/min (A) (by C-G formula based on SCr of 0.41 mg/dL (L)). Liver Function Tests:  Recent Labs  Lab 03/12/23 1301  AST 24  ALT 23  ALKPHOS 48  BILITOT 1.1  PROT 8.1  ALBUMIN 5.0   Recent Labs  Lab 03/12/23 1301  LIPASE 26  No results for input(s): "CHOL", "HDL", "LDLCALC", "TRIG", "CHOLHDL", "LDLDIRECT" in the last 72 hours. Recent Labs    03/15/23 1534  TSH 0.816   No results for input(s): "PROCALCITON", "LATICACIDVEN" in the last 168 hours. No results found for this or any previous visit (from the past 240 hours).  Antimicrobials/Microbiology: Anti-infectives (From admission, onward)    None         Component Value Date/Time   SDES URINE, CLEAN CATCH 05/01/2022 1533   SPECREQUEST  05/01/2022 1533    NONE Performed at [facility], [address] [place], [place] [address]    CULT >=100,000 COLONIES/mL ESCHERICHIA COLI (A) 05/01/2022 1533    REPTSTATUS 05/03/2022 FINAL 05/01/2022 1533     Radiology Studies: No results found.   LOS: 5 days   Total time spent in review of labs and imaging, patient evaluation, formulation of plan, documentation and communication with family: 35 minutes  Lanae Boast, MD  [facility]  03/18/2023, 12:21 PM

## 2023-03-18 NOTE — Hospital Course (Addendum)
30 yof with history of chronic abdominal/pelvic pain, depression, anxiety, fibromyalgia, adrenal insufficiency and hypertension who presented to the ED hospital abdominal pain along with nausea and vomiting.  She stated that the nausea and vomiting started at the same time as the pain and she feels that it is precipitated by the pain.  Most of her pain is in the suprapubic area but after she began to have vomiting she had some left lower quadrant pain radiating to her back as well. CT abdomen pelvis, pelvic Doppler and pelvic ultrasound in the ED unremarkable. CT renal stone study on 1/13 was also negative UA did not show any infection, Urine pregnancy negative. Patient admitted for intractable pelvic and abdominal pain along with nausea and vomiting.  Patient was seen by GI, psychiatry and medication adjusted, IV fluids symptomatic management , antiemetics, pain management.  Seen by [facility] outpatient follow-up arranged with PCP and also pain management clinic instructed

## 2023-03-18 NOTE — TOC Progression Note (Signed)
Transition of Care [facility]) - Progression Note    Patient Details  Name: Krystal [name] MRN: [id number] Date of Birth: 1990/05/20  Transition of Care [facility]) CM/SW Contact  Amada Jupiter, LCSW Phone Number: 03/18/2023, 3:35 PM  Clinical Narrative:     Have set up a new PCP appointment with [facility] ([facility] appts very delayed) - 1/29 @ 2:10pm and information is on AVS.    Expected Discharge Plan: Homeless Shelter Barriers to Discharge: No Barriers Identified  Expected Discharge Plan and Services   Discharge Planning Services: CM Consult   Living arrangements for the past 2 months: Homeless Shelter                                       Social Determinants of Health (SDOH) Interventions SDOH Screenings   Food Insecurity: Patient Declined (03/13/2023)  Housing: Patient Declined (03/13/2023)  Transportation Needs: Patient Declined (03/13/2023)  Utilities: Patient Declined (03/13/2023)  Alcohol Screen: Low Risk  (04/09/2022)  Depression (PHQ2-9): High Risk (10/06/2022)  Physical Activity: Inactive (04/09/2022)  Social Connections: Patient Declined (03/13/2023)  Tobacco Use: Low Risk  (03/13/2023)    Readmission Risk Interventions    03/17/2023    1:59 PM 04/30/2022    3:03 PM  Readmission Risk Prevention Plan  Transportation Screening Complete Complete  PCP or Specialist Appt within 3-5 Days Complete   HRI or Home Care Consult Complete   Social Work Consult for Recovery Care Planning/Counseling Complete   Palliative Care Screening Complete   Medication Review [employer]) Complete Referral to Pharmacy  PCP or Specialist appointment within 3-5 days of discharge  --  HRI or Home Care Consult  Complete  SW Recovery Care/Counseling Consult  Complete  Palliative Care Screening  Not Applicable  Skilled Nursing Facility  Not Applicable

## 2023-03-19 MED ORDER — LINACLOTIDE 145 MCG PO CAPS
145.0000 ug | ORAL_CAPSULE | Freq: Every day | ORAL | Status: DC
  Administered 2023-03-20 – 2023-03-21 (×2): 145 ug via ORAL
  Filled 2023-03-19 (×2): qty 1

## 2023-03-19 MED ORDER — ENOXAPARIN SODIUM 40 MG/0.4ML IJ SOSY
40.0000 mg | PREFILLED_SYRINGE | INTRAMUSCULAR | Status: DC
  Administered 2023-03-20 – 2023-03-21 (×2): 40 mg via SUBCUTANEOUS
  Filled 2023-03-19 (×2): qty 0.4

## 2023-03-19 NOTE — Consult Note (Signed)
Referring Provider: [facility] Primary Care Physician:  Patient, No Pcp Per Primary Gastroenterologist: Unassigned  Reason for Consultation: Chronic pain, nausea and vomiting  HPI: Krystal [name] is a 33 y.o. female with past medical history of adrenal insufficiency, PTSD, anxiety, depression, history of help syndrome, history of left ovarian cystectomy and lysis of ideation in February 2024, and history of uncontrolled hypertension admitted to the hospital few days ago with abdominal pain, nausea and vomiting.  Urine drug screen positive for THC, benzos and opiates.  Normal CBC, CMP and lipase.  Normal TSH.  CT abdomen pelvis with IV contrast on admission on March 13, 2023 showed no acute changes.  She is complaining of suprapubic abdominal pain which radiates towards perineal area.  Pain is worse with urination.  Pain is also worse during bowel movement with no improvement in pain with the bowel movement.  Continues to have nausea and vomiting.  Complaining of loose stools with intermittent constipation.  Denies seeing any blood in the stool or black stool.  Continues to use marijuana.  Last use 1 to 2 weeks ago.   Previous workup Patient with history of intermittent nausea and vomiting looks like dated back to few years.  Her EGD in March 2022 for evaluation of epigastric pain and nausea which showed gastritis.  Biopsies were negative for H. pylori.   Sequently underwent cholecystectomy in March 2022.  Flex sig in March 2024 showed poor prep and normal mucosa.  Biopsies were negative for any acute changes.  Past Medical History:  Diagnosis Date   Acute acalculous cholecystitis s/p lap cholecystectomy 05/11/2020 05/11/2020   Angio-edema 01/15/2021   Anxiety    Asthma    Depression    HELLP (hemolytic anemia/elev liver enzymes/low platelets in pregnancy) 12/08/2017   Hemorrhagic cyst of left ovary 04/15/2022   History of borderline personality disorder    History of HELLP syndrome,  currently pregnant 04/21/2019   Formatting of this note might be different from the original.  History of HELLP syndrome at 26 weeks 5 days (12/07/2017); admitted to [facility] with BP 200/120s, Plt 52, AST/ALT 471/375  APS evaluation negative (04/2019)   Daily ASA 81 mg   Challenging blood pressure control in current pregnancy      Plan:  - Baseline pre-eclampsia labs this pregnancy within normal limits (Cr: 0.71; urine p:c 0.106)  -    IC (interstitial cystitis)    Kidney stones    Migraines    Necrotizing fasciitis (HCC)    left hand amputation   PTSD (post-traumatic stress disorder)    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   Severe uncontrolled hypertension 10/08/2017   UTI (urinary tract infection)     Past Surgical History:  Procedure Laterality Date   APPENDECTOMY     BIOPSY  05/10/2020   Procedure: BIOPSY;  Surgeon: Willis Modena, MD;  Location: [facility] ENDOSCOPY;  Service: Endoscopy;;   BIOPSY  05/02/2022   Procedure: BIOPSY;  Surgeon: Benancio Deeds, MD;  Location: [facility] ENDOSCOPY;  Service: Gastroenterology;;   CESAREAN SECTION N/A 12/06/2017   Procedure: CESAREAN SECTION;  Surgeon: Conan Bowens, MD;  Location: [facility] BIRTHING SUITES;  Service: Obstetrics;  Laterality: N/A;   CESAREAN SECTION  07/21/2021   Procedure: CESAREAN SECTION;  Surgeon: Reva Bores, MD;  Location: [facility] LD ORS;  Service: Obstetrics;;   CHOLECYSTECTOMY N/A 05/11/2020   Procedure: SINGLE SITE LAPAROSCOPIC CHOLECYSTECTOMY AND LIVER BIOSY;  Surgeon: Karie Soda, MD;  Location: [facility] ORS;  Service: General;  Laterality: N/A;   ESOPHAGOGASTRODUODENOSCOPY (  EGD) WITH PROPOFOL N/A 05/10/2020   Procedure: ESOPHAGOGASTRODUODENOSCOPY (EGD) WITH PROPOFOL;  Surgeon: Willis Modena, MD;  Location: [facility] ENDOSCOPY;  Service: Endoscopy;  Laterality: N/A;   FLEXIBLE SIGMOIDOSCOPY N/A 05/02/2022   Procedure: FLEXIBLE SIGMOIDOSCOPY;  Surgeon: Benancio Deeds, MD;  Location: [facility] ENDOSCOPY;  Service: Gastroenterology;  Laterality: N/A;   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 Authorizing Provider  acetaminophen (TYLENOL) 500 MG tablet Take 1,000 mg by mouth daily as needed for moderate pain, headache or fever.   Yes [provider]  albuterol (VENTOLIN HFA) 108 (90 Base) MCG/ACT inhaler Inhale 2 puffs into the lungs every 4 (four) hours as needed for wheezing or shortness of breath. 10/15/22  Yes Ellamae Sia, DO  EPINEPHrine 0.3 mg/0.3 mL IJ SOAJ injection Inject 0.3 mg into the muscle as needed for anaphylaxis. 01/15/21  Yes Ellamae Sia, DO  famotidine (PEPCID) 20 MG tablet Take 1 tablet (20 mg total) by mouth 2 (two) times daily. 12/04/21  Yes Ellamae Sia, DO  FLUoxetine (PROZAC) 40 MG capsule Take 1 capsule (40 mg total) by mouth daily. IM program 06/19/22  Yes Quincy Simmonds, MD  hydrocortisone (CORTEF) 10 MG tablet Take 1 tablet (10 mg total) by mouth 2 (two) times daily. IM program 06/19/22  Yes Quincy Simmonds, MD  irbesartan-hydrochlorothiazide (AVALIDE) 150-12.5 MG tablet Take 2 tablets by mouth in the morning and at bedtime. 10/06/22 10/06/23 Yes Morene Crocker, MD  levothyroxine (SYNTHROID) 150 MCG tablet Take 1 tablet (150 mcg total) by mouth daily at 6 (six) AM. 10/06/22 10/06/23 Yes Morene Crocker, MD  Multiple Vitamin (MULTIVITAMIN) tablet Take 1 tablet by mouth daily.   Yes [provider]  ondansetron (ZOFRAN) 4 MG tablet Take 1 tablet (4 mg total) by mouth every 8 (eight)  hours as needed for up to 12 doses for nausea or vomiting. 03/09/23  Yes Trifan, Kermit Balo, MD  oxyCODONE (ROXICODONE) 5 MG immediate release tablet Take 2 tablets (10 mg total) by mouth every 6 (six) hours as needed for up to 12 doses for severe pain (pain score 7-10). 03/09/23  Yes Trifan, Kermit Balo, MD  promethazine (PHENERGAN) 25 MG suppository Place 1 suppository (25 mg total) rectally every 6 (six) hours as needed for nausea or vomiting. 01/02/23  Yes Al Decant, PA-C  SYMBICORT 80-4.5 MCG/ACT inhaler Inhale 2 puffs into the lungs 2 (two) times daily as needed (shortness of breath).   Yes [provider]  amLODipine (NORVASC) 10 MG tablet Take 1 tablet (10 mg total) by mouth daily. Patient not taking: Reported on 03/13/2023 10/06/22   Morene Crocker, MD  lidocaine (LIDODERM) 5 % Place 1 patch onto the skin daily. Remove & Discard patch within 12 hours or as directed by MD Patient not taking: Reported on 03/13/2023 05/05/22   Doran Stabler, DO    Scheduled Meds:  amLODipine  10 mg Oral Daily   Chlorhexidine Gluconate Cloth  6 each Topical Daily   enoxaparin (LOVENOX) injection  50 mg Subcutaneous Q24H   FLUoxetine  40 mg Oral Daily   fluticasone furoate-vilanterol  1 puff Inhalation Daily   hydrocortisone sod succinate (SOLU-CORTEF) inj  100 mg Intravenous Q8H   levothyroxine  150 mcg Oral Q0600   OLANZapine zydis  7.5 mg Oral QHS   oxybutynin  5 mg Oral QHS   oxyCODONE  5 mg Oral QID   pantoprazole  40 mg Oral BID   senna  2 tablet Oral Daily   sodium chloride flush  3 mL Intravenous Q12H   zolpidem  5 mg Oral QHS   Continuous Infusions:  sodium chloride 75 mL/hr at 03/19/23 0925   promethazine (PHENERGAN) injection (IM or IVPB) 6.25 mg (03/18/23 1454)   PRN Meds:.acetaminophen **OR** acetaminophen, albuterol, diphenhydrAMINE **OR** diphenhydrAMINE, hydrALAZINE, hydrocerin, LORazepam, morphine injection, OLANZapine, ondansetron **OR** ondansetron (ZOFRAN) IV,  promethazine (PHENERGAN) injection (IM or IVPB)  Allergies as of 03/12/2023 - Reviewed 03/12/2023  Allergen Reaction Noted   Bee venom Anaphylaxis and Swelling 04/25/2022   Coreg [carvedilol] Shortness Of Breath and Other (See Comments) 03/06/2022   Red dye #40 (allura red) Other (See Comments) 11/06/2015   Compazine [prochlorperazine maleate] Other (See Comments) 11/06/2015   Oxycodone  05/01/2022   Vicodin [hydrocodone-acetaminophen] Itching, Nausea And Vomiting, and Other (See Comments) 11/06/2015   Cymbalta [duloxetine hcl] Other (See Comments) 05/08/2020   Ditropan [oxybutynin] Other (See Comments) 04/25/2022   Elavil [amitriptyline] Other (See Comments) 04/25/2022   Motrin [ibuprofen] Other (See Comments) 04/18/2022   Naprosyn [naproxen] Other (See Comments) 03/06/2022   Pyrimethamine Other (See Comments) 04/25/2022   Sulfadoxine Other (See Comments) 04/25/2022   Zestril [lisinopril] Cough 08/10/2021   Nifedical xl [nifedipine] Nausea And Vomiting 09/07/2018   Reglan [metoclopramide] Swelling and Other (See Comments) 07/24/2017   Trandate [labetalol] Nausea And Vomiting and Other (See Comments) 06/22/2019    Family History  Problem Relation Age of Onset   Hypertension Mother    Coronary artery disease Mother    Hypertension Father    Stroke Father    Atrial fibrillation Father    Heart failure Maternal Grandfather    Heart attack Maternal Grandfather     Social History   Socioeconomic History   Marital status: Married    Spouse name: Not on file   Number of children: 3   Years of education: Not on file   Highest education level: 8th grade  Occupational History   Not on file  Tobacco Use   Smoking status: Never   Smokeless tobacco: Never   Tobacco comments:    She tried a cigarette and never smoked again  Vaping Use   Vaping status: Never Used  Substance and Sexual Activity   Alcohol use: Yes    Comment: bottle of wine a week-occ   Drug use: No   Sexual  activity: Not Currently    Birth control/protection: Surgical  Other Topics Concern   Not on file  Social History Narrative   Lives with husband and three kids   Social Drivers of Health   Financial Resource Strain: Not on file  Food Insecurity: Patient Declined (03/13/2023)   Hunger Vital Sign    Worried About Running Out of Food in the Last Year: Patient declined    Ran Out of Food in the Last Year: Patient declined  Transportation Needs: Patient Declined (03/13/2023)   PRAPARE - Administrator, Civil Service (Medical): Patient declined    Lack  of Transportation (Non-Medical): Patient declined  Physical Activity: Inactive (04/09/2022)   Exercise Vital Sign    Days of Exercise per Week: 0 days    Minutes of Exercise per Session: 0 min  Stress: Not on file  Social Connections: Patient Declined (03/13/2023)   Social Connection and Isolation Panel [NHANES]    Frequency of Communication with Friends and Family: Patient declined    Frequency of Social Gatherings with Friends and Family: Patient declined    Attends Religious Services: Patient declined    Database administrator or Organizations: Patient declined    Attends Banker Meetings: Patient declined    Marital Status: Patient declined  Intimate Partner Violence: Patient Declined (03/13/2023)   Humiliation, Afraid, Rape, and Kick questionnaire    Fear of Current or Ex-Partner: Patient declined    Emotionally Abused: Patient declined    Physically Abused: Patient declined    Sexually Abused: Patient declined    Review of Systems: All negative except as stated above in HPI.  Physical Exam: Vital signs: Vitals:   03/19/23 0820 03/19/23 0821  BP:    Pulse:    Resp:    Temp:    SpO2: 99% 99%   Last BM Date : 03/17/23 General: Emotionally upset because of ongoing symptoms, somewhat tearful, not in acute distress -Normocephalic, atraumatic, extraocular movement intact Lungs: No visible respiratory  distress Heart:  Regular rate and rhythm; no murmurs, clicks, rubs,  or gallops. Abdomen: Suprapubic tenderness to palpation, abdomen is otherwise soft, bowel sound present, no peritoneal signs Rectal:  Deferred  GI:  Lab Results: No results for input(s): "WBC", "HGB", "HCT", "PLT" in the last 72 hours. BMET Recent Labs    03/17/23 0834 03/18/23 1108  NA 140 140  K 3.2* 3.5  CL 103 104  CO2 25 24  GLUCOSE 110* 107*  BUN 8 15  CREATININE 0.41* 0.61  CALCIUM 9.0 9.0   LFT No results for input(s): "PROT", "ALBUMIN", "AST", "ALT", "ALKPHOS", "BILITOT", "BILIDIR", "IBILI" in the last 72 hours. PT/INR No results for input(s): "LABPROT", "INR" in the last 72 hours.   Studies/Results: No results found.  Impression/Plan: -Chronic abdominal pain.  Patient is complaining of suprapubic abdominal pain which radiates towards perineal area and pain is worse with urination. -Chronic nausea and vomiting.  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  Normal blood work.  CT scan earlier this admission negative for any acute changes.  Endoscopy in 2022 without any acute changes.  Flex sig in March 2024 showed poor prep with negative biopsies.  She is status postcholecystectomy. -Constipation with likely overflow diarrhea  Recommendations -------------------------- -Recommend urology or OB/GYN consult for suprapubic pain which is worse with urination. 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 -Recommend to avoid marijuana.  Minimize narcotics. -GI will follow     LOS: 6 days   Kathi Der  MD, FACP 03/19/2023, 10:04 AM  Contact #  [phone]

## 2023-03-19 NOTE — Progress Notes (Signed)
PROGRESS NOTE Krystal [name]  ZOX:[id number] DOB: 05-31-1990 DOA: 03/12/2023 PCP: Patient, No Pcp Per  Brief Narrative/Hospital Course: 50 yof with history of chronic abdominal/pelvic pain, depression, anxiety, fibromyalgia, adrenal insufficiency and hypertension who presented to the ED hospital abdominal pain along with nausea and vomiting.  She stated that the nausea and vomiting started at the same time as the pain and she feels that it is precipitated by the pain.  Most of her pain is in the suprapubic area but after she began to have vomiting she had some left lower quadrant pain radiating to her back as well. CT abdomen pelvis, pelvic Doppler and pelvic ultrasound in the ED unremarkable. CT renal stone study on 1/13 was also negative UA did not show any infection, Urine pregnancy negative. Patient admitted for intractable pelvic and abdominal pain along with nausea and vomiting.     Subjective: Patient seen this morning about to have her breakfast  Reported she vomited this morning, also complains of constipation, lower pelvic pain  No new complaints does not appear tearful appears pleasant this morning Husband on the phone   Assessment and Plan: Principal Problem:   Intractable nausea and vomiting Active Problems:   Anxiety   Hypothyroidism due to Hashimoto's thyroiditis   Resistant hypertension   MDD (major depressive disorder), recurrent episode, severe (HCC)   Moderate persistent asthma with acute exacerbation   Adrenal insufficiency (HCC)   Chronic interstitial cystitis   Intractable vomiting with nausea   Pelvic and abdominal pain, likely chronic Chronic interstitial cystitis with occasional exacerbation Chronic nausea vomiting: Pain is felt to be chronic pain question with interstitial cystitis with occasional exacerbation.UA no evidence of UTI, CT abdomen pelvis pelvic Doppler pelvic ultrasound and CT renal stone unremarkable, LFTs, lipase normal. Dr Ladell Heads discussed  w/ urology,who advised follow-up with outpatient urologist has appointment in February 9. Endoscopy in 2022 without any acute changes flex sigmoidoscopy March 2024 showed poor prep with negative biopsies. She is S/p cholecystectomy.  Starting Linzess 145 mcg for constipation, GI consulted input appreciated. 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  Advised to avoid marijuana minimize narcotics.?  Ultrasound of the component of pain functional in the setting of her underlying psych issues.  Anxiety/depression: Input appreciated from psychiatry, having significant marital issues, with her chronic medical issues hypertension anxious tearful. Patient having crying spells frequently, per psychiatry cont fluoxetine 40 mg increased bedtime olanzapine 1/22, cont 2.5mg  tid prn as well , minimize IV Ativan.  Psychiatry following closely no need for inpatient psych  Adrenal insufficiency: Continue IV hydrocortisone for now.  Hypothyroidism: TSH is normal at 0.8, continue Synthroid  HTN: BP stable, cont amlodipine  UDS positive for benzo, narcotics and marijuana: on 113 received Ativan and Dilaudid which explains benzos and narcotics and UDS.She reports taking some delta 8 Gummies at home to see if it helped her pain.  Left hemorrhagic ovarian cyst on 01/2023: status post laparoscopic LOA.  S/p remote left hand amputation   Obesity: Patient's Body mass index is 31.13 kg/m. : Will benefit with PCP follow-up, weight loss  healthy lifestyle and outpatient sleep evaluation.   DVT prophylaxis:  Code Status:   Code Status: Full Code Family Communication: plan of care discussed with patient at bedside.  Husband updated on the phone Patient status is: Remains hospitalized because of severity of illness Level of care: Med-Surg   Dispo: The patient is from: home  Anticipated disposition:  TBD Objective: Vitals last 24 hrs: Vitals:   03/18/23 2253 03/19/23 0600 03/19/23 0820 03/19/23 0821  BP: (!) 161/114 (!) 148/97    Pulse: 66 65    Resp: 18 17    Temp: 98 F (36.7 C) 98 F (36.7 C)    TempSrc: Oral Oral    SpO2: 97% 97% 99% 99%  Weight:      Height:       Weight change:   Physical Examination: General exam: alert awake, oriented at baseline HEENT:Oral mucosa moist, Ear/Nose WNL grossly Respiratory system: Bilaterally clear BS,no use of accessory muscle Cardiovascular system: S1 & S2 +, No JVD. Gastrointestinal system: Abdomen soft,NT,ND, BS+ Nervous System: Alert, awake, moving all extremities,and following commands. Extremities: LE edema neg,distal peripheral pulses palpable and warm.  Skin: No rashes,no icterus. MSK: Normal muscle bulk,tone, power   Medications reviewed:  Scheduled Meds:  amLODipine  10 mg Oral Daily   Chlorhexidine Gluconate Cloth  6 each Topical Daily   enoxaparin (LOVENOX) injection  50 mg Subcutaneous Q24H   FLUoxetine  40 mg Oral Daily   fluticasone furoate-vilanterol  1 puff Inhalation Daily   hydrocortisone sod succinate (SOLU-CORTEF) inj  100 mg Intravenous Q8H   levothyroxine  150 mcg Oral Q0600   [START ON 03/20/2023] linaclotide  145 mcg Oral QAC breakfast   OLANZapine zydis  7.5 mg Oral QHS   oxybutynin  5 mg Oral QHS   oxyCODONE  5 mg Oral QID   pantoprazole  40 mg Oral BID   senna  2 tablet Oral Daily   sodium chloride flush  3 mL Intravenous Q12H   zolpidem  5 mg Oral QHS   Continuous Infusions:  sodium chloride 75 mL/hr at 03/19/23 0925   promethazine (PHENERGAN) injection (IM or IVPB) 6.25 mg (03/18/23 1454)    Diet Order             Diet regular Room service appropriate? Yes; Fluid consistency: Thin  Diet effective now                   Intake/Output Summary (Last 24 hours) at 03/19/2023 1139 Last data filed at 03/19/2023 1112 Gross per 24 hour  Intake 3113.79 ml  Output 2150 ml  Net 963.79 ml    Net IO Since Admission: 2,847.85 mL [03/19/23 1139]  Wt Readings from Last 3 Encounters:  03/13/23 101.2 kg  03/09/23 101.2 kg  01/25/23 108.9 kg     Unresulted Labs (From admission, onward)     Start     Ordered   03/20/23 0500  Creatinine, serum  (enoxaparin (LOVENOX)    CrCl >/= 30 ml/min)  Weekly,   R     Comments: while on enoxaparin therapy    03/13/23 0038          Data Reviewed: I have personally reviewed following labs and imaging studies CBC: Recent Labs  Lab 03/12/23 1610 03/13/23 0543 03/14/23 0547  WBC 7.2 7.9 7.4  NEUTROABS 4.2  --   --   HGB 15.7* 14.5 14.3  HCT 47.4* 44.2 42.5  MCV 93.3 96.1 94.4  PLT 285 323 292   Basic Metabolic Panel:  Recent Labs  Lab 03/13/23 0543 03/14/23 0547 03/15/23 0514 03/17/23 0834 03/18/23 1108  NA 139 138 138 140 140  K 4.0 3.7 3.6 3.2* 3.5  CL 108 107 105 103 104  CO2 24 27 26 25 24   GLUCOSE 88 112* 106* 110* 107*  BUN 18 12  5* 8 15  CREATININE 0.87 0.74 0.74 0.41* 0.61  CALCIUM 8.5* 8.7* 8.7* 9.0 9.0   GFR: Estimated Creatinine Clearance: 129.8 mL/min (by C-G formula based on SCr of 0.61 mg/dL). Liver Function Tests:  Recent Labs  Lab 03/12/23 1301  AST 24  ALT 23  ALKPHOS 48  BILITOT 1.1  PROT 8.1  ALBUMIN 5.0   Recent Labs  Lab 03/12/23 1301  LIPASE 26   Antimicrobials/Microbiology: Anti-infectives (From admission, onward)    None         Component Value Date/Time   SDES URINE, CLEAN CATCH 05/01/2022 1533   SPECREQUEST  05/01/2022 1533    NONE Performed at [facility], [address], [place], [place] [address]    CULT >=100,000 COLONIES/mL ESCHERICHIA COLI (A) 05/01/2022 1533   REPTSTATUS 05/03/2022 FINAL 05/01/2022 1533     Radiology Studies: No results found.   LOS: 6 days   Total time spent in review of labs and imaging, patient evaluation, formulation of plan, documentation and communication with family: 35 minutes  Lanae Boast, MD  [facility]  03/19/2023, 11:39 AM

## 2023-03-19 NOTE — Progress Notes (Signed)
Chaplain spent time with Krystal [name] to provide emotional support. She has significant pain and is aware that stress may be a contributing factor.  She is able to share about the family dynamics and also have an awareness that she may be personalizing what she perceives as a lack of support from family due to her BPD. First and foremost on her mind is finding relief from the pain so that she can parent her children in the way that she wants.  She is concerned about her relationship with her husband, but at the same time, she is aware that he is trying to take care of the girls and run a business while also supporting him.  She wants to get into a pain management clinic outpatient, but doesn't even know how to begin to look for one.  She is very anxious to be discharged when she still doesn't know the root or cure for her pain.    Chaplain provided emotional support and psychological support through listening and assisting with reframing.  Chaplain also spoke with members of the medical team about how she can get support after discharge.  [address] Bcc Pager, [phone]

## 2023-03-19 NOTE — Progress Notes (Signed)
Chaplain visited pt while rounding on unit, pt was alert and willing to visit. Pt was tearful periodically throughout this visit. She shared her frustration that the source of her pain has not been diagnosed and she hopes to be able to resume daily activities pain-free. Pt and her husband have limited support from family members, especially within the last 18 months when her FIL was diagnosed with Alzheimer's, prior to that, he was able to help pt and her spouse with their 3 young children. Chaplain provided reflective listening, prayer at pt's request and a compassionate presence. Pt smiled, appeared more peaceful and relaxed at the close of this visit.  Chaplain [place], [place]   03/19/23 1100  Spiritual Encounters  Type of Visit Initial  Care provided to: Patient  Referral source Chaplain team  Reason for visit Routine spiritual support  OnCall Visit No  Spiritual Framework  Presenting Themes Meaning/purpose/sources of inspiration;Goals in life/care;Coping tools  Patient Stress Factors Family relationships;Financial concerns;Health changes  Interventions  Spiritual Care Interventions Made Established relationship of care and support;Compassionate presence;Reflective listening;Prayer;Meditation  Intervention Outcomes  Outcomes Connection to spiritual care;Reduced anxiety  Spiritual Care Plan  Spiritual Care Issues Still Outstanding Referring to oncoming chaplain for further support

## 2023-03-19 NOTE — Consult Note (Signed)
[facility] Psychiatric Consult Initial  Patient Name: .Krystal [name]  MRN: [id number]  DOB: [birth year]  Consult Order details:  severe anxiety/Dr. Candee Furbish of Visit: In person    Psychiatry Consult Evaluation  Service Date: March 19, 2023 LOS:  LOS: 6 days  Chief Complaint "I can't keep going like this"  Primary Psychiatric Diagnoses  Mdd/recurrent/severe 2.  GAD 3. R/o somatic sx d/o  Assessment  Krystal [name] is a 33 y.o. female admitted: Medicallyfor 03/12/2023 11:37 AM for pelvic pain. She carries the psychiatric diagnoses of depression and anxiety and has a past medical history of  hypothyroidism, HTN, intractable vomiting, and interstitial cystitis.   I am actually very familiar with this pt from prior prolonged admission for pregnancy complications - was treating for depression and anxiety. Historically sx are much worse in inpt medical setting due to hx medical trauma.  She had done fairly well as an outpt with Dr. Mercy Riding for some time (moved from venlafaxine to fluoxetine) but was lot to followup when her insurance changed.   Her current presentation of hysterical crying and worsening of physical sx of N+V, bladder pain is most consistent with known diagnoses of depression and anxiety; there is some concern for a bidirectional relationship between physical sx and mental health (ie somatic sx d/o) based on timeline of events and pt report.  Current outpatient psychotropic medications include prozac 40 mg and historically she has had a fair to good response to these medications, but only when also receiving therapy. She has been out of therapy for several months due to insurance issues. She was compliant with medications prior to admission as evidenced by pt report. On initial examination, patient was continually crying through exam and briefly interrupted exam to vomit. She was marginally improved on repeat exam 1/21 and similar on 1/22. Please see plan below for  detailed recommendations.  1/23: Reassessment: Patient continues to experience cyclical vomiting and pain that seems to be increased by stress. She reports modest improvement with olanzapine to include sleeping and tolerating food and smells. She reports sleeping very well last night and eating. She remains positive about her ability to eat, even though she was vomiting moments before. She continues to suffer with increased anxiety 2/t fear of unknown. Seems to have some psychological components and suspect possible conversion disorder. Patient will benefit from increase in therapeutic services via an IOP program. She is open to joining IOP and found that program helpful in the past. "I went there about 1.5 years ago. "   Diagnoses:  Active Hospital problems: Principal Problem:   Intractable nausea and vomiting Active Problems:   Anxiety   Hypothyroidism due to Hashimoto's thyroiditis   Resistant hypertension   MDD (major depressive disorder), recurrent episode, severe (HCC)   Moderate persistent asthma with acute exacerbation   Adrenal insufficiency (HCC)   Chronic interstitial cystitis   Intractable vomiting with nausea    Plan   ## Psychiatric Medication Recommendations:  -- c fluoxteine 40 mg -- c olanzapine to 7.5  mg at bedtime and continue 2.5 mg TID PRN anxiety/nausea also adjunctive for depression  -GI discussed starting amitriptyline for pain and n/v. IF decided to start, please do not continue prn olanzapine at discharge.    ## Medical Decision Making Capacity: Not specifically addressed in this encounter  ## Further Work-up:  -- none currently  While pt on Qtc prolonging medications, please monitor & replete K+ to 4 and Mg2+ to 2 -- most recent  EKG on 1/17 had QtC of 433 -- Pertinent labwork reviewed earlier this admission includes: UDS + Opiates,  BZD, THC   ## Disposition:-- There are no psychiatric contraindications to discharge at this time  -Referral placed for  [facility] IOP. See follow up for instructions.   ## Behavioral / Environmental: - Patients with borderline personality traits/disorder often use the language of physical pain to communicate both physical and emotional suffering. It is important to address pain complaints as they arise and attempt to identify an etiology, either organic or psychiatric. In patients with chronic pain, it is important to have a discussion with the patient about expectations about pain control. or Utilize compassion and acknowledge the patient's experiences while setting clear and realistic expectations for care.    ## Safety and Observation Level:  - Based on my clinical evaluation, I estimate the patient to be at low risk of self harm in the current setting. - At this time, we recommend  routine. This decision is based on my review of the chart including patient's history and current presentation, interview of the patient, mental status examination, and consideration of suicide risk including evaluating suicidal ideation, plan, intent, suicidal or self-harm behaviors, risk factors, and protective factors. This judgment is based on our ability to directly address suicide risk, implement suicide prevention strategies, and develop a safety plan while the patient is in the clinical setting. Please contact our team if there is a concern that risk level has changed.  CSSR Risk Category:C-SSRS RISK CATEGORY: No Risk  Suicide Risk Assessment: Patient has following modifiable risk factors for suicide: under treated depression  and lack of access to outpatient mental health resources, which we are addressing by medication changes and recommending low-cost therapy options Patient has following non-modifiable or demographic risk factors for suicide: separation or divorce and history of suicide attempt Patient has the following protective factors against suicide: Supportive family and Minor children in the home  Thank  you for this consult request. Recommendations have been communicated to the primary team.  We will sign off at this time.   Maryagnes Amos, FNP       History of Present Illness  Relevant Aspects of [facility] Course:  Admitted on 03/12/2023 for pelvic pain. They have been crying uncontrollably through much of admission.   Patient Report:  Patient seen x 2 attempts, initial attempts she was observed to be vomiting brown contents(suspect this was chocolate pudding open container at bedside). On second occasion she was at the bedside with the chaplain, very tearful and emotionally dysregulated. She continues to remain hopeful with treatment and pain relief. She states the olanzapine worked well and she was able to get a good nights rest. She states the olanzapine allowed her to eat food without gagging. She denies any thoughts of SI, HI, AVH. She does not seem to like the idea of going home possibly today and it should be noted this was a distraction for her as she was no longer tearful.Patient expressed interest(s) in IOP today when asking for anything else.    Psych ROS:  Partly truncated due to crying/vomiting Depression: low mood, worthlessness, constant crying, etc. Denies SI Anxiety:  as in HPI Mania (lifetime and current): denies Psychosis: (lifetime and current): denies  Collateral information:  Spoke to nursing staff/primary MD - crying contiually all day.   Review of Systems  Reason unable to perform ROS: bladder pain, nausea, vomiting most significant.  All other systems reviewed and are  negative.    Psychiatric and Social History  Psychiatric History:  Information collected from pt, medical record  Prev Dx/Sx: MDD, anxiety, ADD Current Psych Provider: None (prev dr. 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 rate 17, height 5\' 11"  (1.803 m), weight 101.2 kg, SpO2 99%, not currently breastfeeding. Body mass index is 31.13 kg/m.  Physical Exam HENT:     Head: Normocephalic.  Pulmonary:     Effort: Pulmonary effort is normal.  Neurological:     Mental Status: She is alert.    Mental Status Exam: General Appearance:  in hospital attire  Orientation:  Full (Time, Place, and Person)  Memory:  Immediate;   Good Recent;   Fair Remote;   Good  Concentration:  Concentration: Good  Recall:  Good  Attention  Good  Eye Contact:  Good  Speech:  Clear and Coherent  Language:  Good  Volume:  Normal  Mood: "Ive been better"  Affect:  Congruent, Full Range, and Tearful -today brightens appropriately  Thought Process:  Coherent  Thought Content:   Normal  Suicidal Thoughts:  No  Homicidal Thoughts:  No  Judgement:  Fair  Insight:  Good  Psychomotor Activity:  Increased (rocking)  Akathisia:  No  Fund of Knowledge:  Good      Assets:  Communication Skills Desire for  Improvement Housing Social Support Talents/Skills   Other History   These have been pulled in through the EMR, reviewed, and updated if appropriate.  Family History:  The patient's family history includes Atrial fibrillation in her father; Coronary artery disease in her mother; Heart attack in her maternal grandfather; Heart failure in her maternal grandfather; Hypertension in her father and mother; Stroke in her father.  Medical History: Past Medical History:  Diagnosis Date   Acute acalculous cholecystitis s/p lap cholecystectomy 05/11/2020 05/11/2020   Angio-edema 01/15/2021   Anxiety    Asthma    Depression    HELLP (hemolytic anemia/elev liver enzymes/low platelets in pregnancy) 12/08/2017   Hemorrhagic cyst of left ovary 04/15/2022   History of borderline personality disorder    History of HELLP syndrome, currently pregnant 04/21/2019   Formatting of this note might be different from the original.  History of HELLP syndrome at 26 weeks 5 days (12/07/2017); [facility] with BP 200/120s, Plt 52, AST/ALT 471/375  APS evaluation negative (04/2019)   Daily ASA 81 mg   Challenging blood pressure control in current pregnancy      Plan:  -  Baseline pre-eclampsia labs this pregnancy within normal limits (Cr: 0.71; urine p:c 0.106)  -    IC (interstitial cystitis)    Kidney stones    Migraines    Necrotizing fasciitis (HCC)    left hand amputation   PTSD (post-traumatic stress disorder)    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   Severe uncontrolled hypertension 10/08/2017   UTI (urinary tract infection)     Surgical History: Past Surgical History:  Procedure Laterality Date   APPENDECTOMY     BIOPSY  05/10/2020   Procedure: BIOPSY;  Surgeon: Willis Modena, MD;  Location: [facility] ENDOSCOPY;  Service: Endoscopy;;   BIOPSY  05/02/2022   Procedure: BIOPSY;  Surgeon: Benancio Deeds, MD;  Location: [facility] ENDOSCOPY;  Service: Gastroenterology;;   CESAREAN SECTION N/A 12/06/2017    Procedure: CESAREAN SECTION;  Surgeon: Conan Bowens, MD;  Location: [facility] BIRTHING SUITES;  Service: Obstetrics;  Laterality: N/A;   CESAREAN SECTION  07/21/2021   Procedure: CESAREAN SECTION;  Surgeon: Reva Bores, MD;  Location: [facility] LD ORS;  Service: Obstetrics;;   CHOLECYSTECTOMY N/A 05/11/2020   Procedure: SINGLE SITE LAPAROSCOPIC CHOLECYSTECTOMY AND LIVER BIOSY;  Surgeon: Karie Soda, MD;  Location: [facility] ORS;  Service: General;  Laterality: N/A;   ESOPHAGOGASTRODUODENOSCOPY (EGD) WITH PROPOFOL N/A 05/10/2020   Procedure: ESOPHAGOGASTRODUODENOSCOPY (EGD) WITH PROPOFOL;  Surgeon: Willis Modena, MD;  Location: [facility] ENDOSCOPY;  Service: Endoscopy;  Laterality: N/A;   FLEXIBLE SIGMOIDOSCOPY N/A 05/02/2022   Procedure: FLEXIBLE SIGMOIDOSCOPY;  Surgeon: Benancio Deeds, MD;  Location: [facility] ENDOSCOPY;  Service: Gastroenterology;  Laterality: N/A;   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   HAND RECONSTRUCTION Right    INCISION AND DRAINAGE     LAPAROSCOPIC LYSIS OF ADHESIONS  04/22/2022   Procedure: LAPAROSCOPIC LYSIS OF ADHESIONS;  Surgeon: Attapulgus Bing, MD;  Location: [facility] OR;  Service: Gynecology;;   LAPAROSCOPIC OVARIAN CYSTECTOMY Left 04/22/2022   Procedure: LAPAROSCOPIC LEFT OVARIAN CYSTECTOMY;  Surgeon: Grinnell Bing, MD;  Location: [facility] OR;  Service: Gynecology;  Laterality: Left;   NASAL SEPTUM SURGERY       Medications:   Current Facility-Administered Medications:    0.9 %  sodium chloride infusion, , Intravenous, Continuous, Rizwan, Saima, MD, Last Rate: 75 mL/hr at 03/19/23 0925, New Bag at 03/19/23 4696   acetaminophen (TYLENOL) tablet 650 mg, 650 mg, Oral, Q6H PRN, 650 mg at 03/19/23 0910 **OR** acetaminophen (TYLENOL) suppository 650 mg, 650 mg, Rectal, Q6H PRN, Opyd, Timothy S, MD   albuterol (PROVENTIL) (2.5 MG/3ML) 0.083% nebulizer solution 2.5 mg, 2.5 mg, Nebulization, Q4H PRN, Opyd, Lavone Neri, MD   amLODipine (NORVASC) tablet 10 mg, 10 mg, Oral, Daily, Rizwan,  Saima, MD, 10 mg at 03/19/23 0910   Chlorhexidine Gluconate Cloth 2 % PADS 6 each, 6 each, Topical, Daily, Calvert Cantor, MD, 6 each at 03/18/23 0830   diphenhydrAMINE (BENADRYL) capsule 25 mg, 25 mg, Oral, Q6H PRN **OR** [DISCONTINUED] diphenhydrAMINE (BENADRYL) injection 25 mg, 25 mg, Intravenous, Q6H PRN, Bell, Michelle T, RPH, 25 mg at 03/18/23 1143   [START ON 03/20/2023] enoxaparin (LOVENOX) injection 40 mg, 40 mg, Subcutaneous, Q24H, Wofford, Drew A, RPH   FLUoxetine (PROZAC) capsule 40 mg, 40 mg, Oral, Daily, Rizwan, Saima, MD, 40 mg at 03/19/23 0910   fluticasone furoate-vilanterol (BREO ELLIPTA) 100-25 MCG/ACT 1 puff, 1 puff, Inhalation, Daily, Rizwan, Saima, MD, 1 puff at 03/19/23 0820   hydrALAZINE (APRESOLINE) injection 10 mg, 10 mg, Intravenous, Q6H PRN, Calvert Cantor, MD, 10 mg at 03/17/23 1329  hydrocerin (EUCERIN) cream, , Topical, Q3H PRN, Calvert Cantor, MD   hydrocortisone sodium succinate (SOLU-CORTEF) 100 MG injection 100 mg, 100 mg, Intravenous, Q8H, Rizwan, Saima, MD, 100 mg at 03/19/23 0912   levothyroxine (SYNTHROID) tablet 150 mcg, 150 mcg, Oral, Q0600, Calvert Cantor, MD, 150 mcg at 03/19/23 0607   [START ON 03/20/2023] linaclotide (LINZESS) capsule 145 mcg, 145 mcg, Oral, QAC breakfast, Brahmbhatt, Parag, MD   LORazepam (ATIVAN) injection 0.5-1 mg, 0.5-1 mg, Intravenous, Q8H PRN, Kc, Ramesh, MD, 1 mg at 03/19/23 0909   morphine (PF) 2 MG/ML injection 2 mg, 2 mg, Intravenous, Q3H PRN, Calvert Cantor, MD, 2 mg at 03/19/23 1133   OLANZapine (ZYPREXA) tablet 2.5 mg, 2.5 mg, Oral, TID PRN, Cinderella, Margaret A, 2.5 mg at 03/18/23 1720   OLANZapine zydis (ZYPREXA) disintegrating tablet 7.5 mg, 7.5 mg, Oral, QHS, Cinderella, Margaret A, 7.5 mg at 03/18/23 2139   ondansetron (ZOFRAN) tablet 4 mg, 4 mg, Oral, Q6H PRN **OR** ondansetron (ZOFRAN) injection 4 mg, 4 mg, Intravenous, Q6H PRN, Opyd, Timothy S, MD, 4 mg at 03/19/23 1134   oxybutynin (DITROPAN-XL) 24 hr tablet 5 mg, 5 mg,  Oral, QHS, Rizwan, Saima, MD, 5 mg at 03/18/23 2139   oxyCODONE (Oxy IR/ROXICODONE) immediate release tablet 5 mg, 5 mg, Oral, QID, Rizwan, Saima, MD, 5 mg at 03/19/23 0910   pantoprazole (PROTONIX) EC tablet 40 mg, 40 mg, Oral, BID, Herby Abraham, RPH, 40 mg at 03/19/23 0910   promethazine (PHENERGAN) 6.25 mg/NS 50 mL IVPB, 6.25 mg, Intravenous, Q6H PRN, Opyd, Lavone Neri, MD, Last Rate: 150 mL/hr at 03/18/23 1454, 6.25 mg at 03/18/23 1454   senna (SENOKOT) tablet 17.2 mg, 2 tablet, Oral, Daily, Rizwan, Saima, MD, 17.2 mg at 03/19/23 0910   sodium chloride flush (NS) 0.9 % injection 3 mL, 3 mL, Intravenous, Q12H, Opyd, Timothy S, MD, 3 mL at 03/19/23 0912   zolpidem (AMBIEN) tablet 5 mg, 5 mg, Oral, QHS, Rizwan, Saima, MD, 5 mg at 03/18/23 2129  Allergies: Allergies  Allergen Reactions   Bee Venom Anaphylaxis and Swelling    Swelling of face, throat. Airway restriction.   Coreg [Carvedilol] Shortness Of Breath and Other (See Comments)    Triggers asthma   Red Dye #40 (Allura Red) Other (See Comments)    Migraines   Compazine [Prochlorperazine Maleate] Other (See Comments)    Twitching Able to tolerate promethazine   Oxycodone     Nausea/vomiting   Vicodin [Hydrocodone-Acetaminophen] Itching, Nausea And Vomiting and Other (See Comments)    OK with Tylenol   Cymbalta [Duloxetine Hcl] Other (See Comments)    Irritability  Syncope    Ditropan [Oxybutynin] Other (See Comments)    Severe dry mouth   Elavil [Amitriptyline] Other (See Comments)    Irritability    Motrin [Ibuprofen] Other (See Comments)    Heartburn    Naprosyn [Naproxen] Other (See Comments)    Heartburn    Pyrimethamine Other (See Comments)    From Fansidar combination drug Triggers asthma   Sulfadoxine Other (See Comments)    From Fansidar combination drug Triggers asthma    Zestril [Lisinopril] Cough   Nifedical Xl [Nifedipine] Nausea And Vomiting   Reglan [Metoclopramide] Swelling and Other (See Comments)     Restlessness Able to take with Benadryl   Trandate [Labetalol] Nausea And Vomiting and Other (See Comments)    Triggers asthma     Maryagnes Amos, FNP

## 2023-03-19 NOTE — Plan of Care (Signed)

## 2023-03-20 LAB — CREATININE, SERUM
Creatinine, Ser: 0.58 mg/dL (ref 0.44–1.00)
GFR, Estimated: >60 mL/min (ref >60)

## 2023-03-20 MED ORDER — ONDANSETRON 4 MG PO TBDP: 4.0000 mg | ORAL_TABLET | Freq: Four times a day (QID) | ORAL | Status: DC | PRN

## 2023-03-20 MED ORDER — OXYCODONE HCL 5 MG PO TABS
5.0000 mg | ORAL_TABLET | Freq: Once | ORAL | Status: AC
  Administered 2023-03-20: 5 mg via ORAL
  Filled 2023-03-20: qty 1

## 2023-03-20 MED ORDER — HYDROCORTISONE 10 MG PO TABS
10.0000 mg | ORAL_TABLET | Freq: Two times a day (BID) | ORAL | Status: DC
  Administered 2023-03-20 – 2023-03-21 (×2): 10 mg via ORAL
  Filled 2023-03-20 (×2): qty 1

## 2023-03-20 MED ORDER — ONDANSETRON HCL 4 MG/2ML IJ SOLN
4.0000 mg | Freq: Four times a day (QID) | INTRAMUSCULAR | Status: DC | PRN
  Administered 2023-03-21: 4 mg via INTRAVENOUS
  Filled 2023-03-20: qty 2

## 2023-03-20 NOTE — TOC Transition Note (Signed)
Transition of Care [facility]) - Discharge Note   Patient Details  Name: Krystal [name] MRN: [id number] Date of Birth: [birth year]  [facility] CM/SW Contact:  Amada Jupiter, LCSW Phone Number: 03/20/2023, 1:30 PM   Clinical Narrative:     Met with pt today to review anticipated dc tomorrow.  Pt confirms she plans to return home with spouse.  She understands and agreeable with recommendation to present to [facility] on Monday for PHP program - instructions on AVS.  She, also, understands that a new patient appointment secured for next Wednesday at [facility] - instructions on AVS as well.  Pt has additional community resources to access prn on AVS.  No further TOC needs.  Final next level of care: Home/Self Care Barriers to Discharge: Barriers Resolved   Patient Goals and CMS Choice Patient states their goals for this hospitalization and ongoing recovery are:: return home CMS Medicare.gov Compare Post Acute Care list provided to:: Patient Choice offered to / list presented to : Patient      Discharge Placement                       Discharge Plan and Services Additional resources added to the After Visit Summary for     Discharge Planning Services: CM Consult            DME Arranged: N/A DME Agency: NA                  Social Drivers of Health (SDOH) Interventions SDOH Screenings   Food Insecurity: Patient Declined (03/13/2023)  Housing: Patient Declined (03/13/2023)  Transportation Needs: Patient Declined (03/13/2023)  Utilities: Patient Declined (03/13/2023)  Alcohol Screen: Low Risk  (04/09/2022)  Depression (PHQ2-9): High Risk (10/06/2022)  Physical Activity: Inactive (04/09/2022)  Social Connections: Patient Declined (03/13/2023)  Tobacco Use: Low Risk  (03/13/2023)     Readmission Risk Interventions    03/17/2023    1:59 PM 04/30/2022    3:03 PM  Readmission Risk Prevention Plan  Transportation Screening Complete  Complete  PCP or Specialist Appt within 3-5 Days Complete   HRI or Home Care Consult Complete   Social Work Consult for Recovery Care Planning/Counseling Complete   Palliative Care Screening Complete   Medication Review Oceanographer) Complete Referral to Pharmacy  PCP or Specialist appointment within 3-5 days of discharge  --  HRI or Home Care Consult  Complete  SW Recovery Care/Counseling Consult  Complete  Palliative Care Screening  Not Applicable  Skilled Nursing Facility  Not Applicable

## 2023-03-20 NOTE — Progress Notes (Addendum)
Pt crying on and off most of the day. Pt begging for more pain meds nonstop. Pt stated that she vomited 3 times today but staff only noted once (400 ccs clear brown liquid, similar to beef broth at her BS table). After received 10 am meds, pt stated she vomited in commode (not seen by staff) and demanded all of her pills all over again. This request was declined. Around lunchtime, pt screaming out loud hysterically, demanding pain, nausea and anxiety meds. Reviewed med list w pt multiple times and many questions answered. Enc pt to walk halls w mobility specialist but she has refused.

## 2023-03-20 NOTE — Progress Notes (Signed)
[facility] Progress Note  Krystal [name] 33 y.o. [birth year]  CC: Chronic abdominal pain, chronic nausea and vomiting   Subjective: Patient seen and examined at bedside.  Continues to complain of abdominal pain.  Just had a large bowel movement.  Continues to have some nausea.  ROS : Afebrile, negative for chest pain   Objective: Vital signs in last 24 hours: Vitals:   03/20/23 0459 03/20/23 0756  BP: (!) 157/104   Pulse: 60   Resp: 18   Temp: 98 F (36.7 C)   SpO2: 98% 98%    Physical Exam: Somewhat anxious patient, not in acute distress.  Generalized abdominal discomfort on palpation, abdomen is soft, bowel sound present, no peritoneal signs.  Alert and oriented x 3.  Lab Results: Recent Labs    03/18/23 1108 03/20/23 0438  NA 140  --   K 3.5  --   CL 104  --   CO2 24  --   GLUCOSE 107*  --   BUN 15  --   CREATININE 0.61 0.58  CALCIUM 9.0  --    No results for input(s): "AST", "ALT", "ALKPHOS", "BILITOT", "PROT", "ALBUMIN" in the last 72 hours. No results for input(s): "WBC", "NEUTROABS", "HGB", "HCT", "MCV", "PLT" in the last 72 hours. No results for input(s): "LABPROT", "INR" in the last 72 hours.    Assessment/Plan: -Chronic abdominal pain.  Patient is complaining of suprapubic abdominal pain which radiates towards perineal area and pain is worse with urination. -Chronic nausea and vomiting.  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  Normal blood work.  CT scan earlier this admission negative for any acute changes.  Endoscopy in 2022 without any acute changes.  Flex sig in March 2024 showed poor prep with negative biopsies.  She is status postcholecystectomy. -Constipation with likely overflow diarrhea   Recommendations -------------------------- -Patient just had large bowel movement. -Recommend to continue Linzess 145 mcg once a day along with MiraLAX as needed to have at least 3 bowel movements per week. -Avoid  amitriptyline while she is on olanzapine. -Avoid marijuana use. -No further inpatient GI workup planned.  GI will sign off.  Call us back if needed.    Kathi Der MD, FACP 03/20/2023, 9:45 AM  Contact #  [phone]

## 2023-03-20 NOTE — Progress Notes (Signed)
PROGRESS NOTE Krystal [name]  QMV:[id number] DOB: May 21, 1990 DOA: 03/12/2023 PCP: Patient, No Pcp Per  Brief Narrative/Hospital Course: 37 yof with history of chronic abdominal/pelvic pain, depression, anxiety, fibromyalgia, adrenal insufficiency and hypertension who presented to the ED hospital abdominal pain along with nausea and vomiting.  She stated that the nausea and vomiting started at the same time as the pain and she feels that it is precipitated by the pain.  Most of her pain is in the suprapubic area but after she began to have vomiting she had some left lower quadrant pain radiating to her back as well. CT abdomen pelvis, pelvic Doppler and pelvic ultrasound in the ED unremarkable. CT renal stone study on 1/13 was also negative UA did not show any infection, Urine pregnancy negative. Patient admitted for intractable pelvic and abdominal pain along with nausea and vomiting.     Subjective: Seen this morning  Agreeable to wean off IV meds and IV fluids and try orally -she would like PR Phenergan and other rx on dc. Reports she is doing well with broth drinking  There is question asn nursing concerned-if patient is not throwing up instead it is food/broth in vomitus bag. Patient complains of vomiting Complaints of lower abdominal dysuria/pain   Assessment and Plan: Principal Problem:   Intractable nausea and vomiting Active Problems:   Anxiety   Hypothyroidism due to Hashimoto's thyroiditis   Resistant hypertension   MDD (major depressive disorder), recurrent episode, severe (HCC)   Moderate persistent asthma with acute exacerbation   Adrenal insufficiency (HCC)   Chronic interstitial cystitis   Intractable vomiting with nausea   Pelvic and abdominal pain, likely chronic Chronic interstitial cystitis with occasional exacerbation Chronic nausea vomiting: Pain is felt to be chronic pain question with interstitial cystitis with occasional exacerbation.UA no evidence of  UTI, CT abdomen pelvis pelvic Doppler pelvic ultrasound and CT renal stone unremarkable, LFTs, lipase normal. Dr Ladell Heads discussed w/ urology-informed nothing additional to offer and patient to follow-up with urology which is on February 9.  Advised outpatient pain management clinic follow-up. Weaning off IV pain meds, try p.o., encourage nonnarcotic meds. Endoscopy in 2022 without any acute changes flex sigmoidoscopy March 2024 showed poor prep with negative biopsies.She is S/p cholecystectomy.  Started Linzess 145 mcg for constipation 1/23 and had BM GI consulted appreciated.  Unable to use amitriptyline already on multiple antipsychotic Patient to quit marijuana use, tried nonnarcotic pain management.  Continue oxybutynin, does not want to try Pyridium due to "red dye allergy" causing migraine\ There is concern the patient symptoms are psychosomatic with her background psych issues/family stress  Anxiety/depression: Input appreciated from psychiatry, having significant marital issues, with her chronic medical issues hypertension anxious tearful.Patient having crying spells frequently Seen by psychiatry continue current Prozac 40 mg, Zyprexa 7.5 nighttime, sleeping 8, as needed Zyprexa Psych has cleared the patient no further recommendation  Adrenal insufficiency: switch IV hydrocortisone to po  Hypothyroidism: TSH is normal at 0.8, continue Synthroid  HTN: BP stable, cont amlodipine  UDS positive for benzo, narcotics and marijuana: on 113 received Ativan and Dilaudid which explains benzos and narcotics and UDS.She reports taking some delta 8 Gummies at home to see if it helped her pain.  Left hemorrhagic ovarian cyst on 01/2023: status post laparoscopic LOA.  S/p remote left hand amputation   Obesity: Patient's Body mass index is 31.13 kg/m. : Will benefit with PCP follow-up, weight loss  healthy lifestyle and outpatient sleep evaluation.   DVT prophylaxis: enoxaparin (LOVENOX)  injection 40 mg Start: 03/20/23 1000 Code Status:   Code Status: Full Code Family Communication: plan of care discussed with patient at bedside.  Husband updated on the phone 1/23 Patient status is: Remains hospitalized because of severity of illness Level of care: Med-Surg   Dispo: The patient is from: home            Anticipated disposition: Anticipate discharge home in the morning  Objective: Vitals last 24 hrs: Vitals:   03/19/23 2230 03/20/23 0236 03/20/23 0459 03/20/23 0756  BP: (!) 182/110 (!) 160/97 (!) 157/104   Pulse: 72 62 60   Resp: 18 18 18    Temp:  97.9 F (36.6 C) 98 F (36.7 C)   TempSrc:  Oral Oral   SpO2: 96% 99% 98% 98%  Weight:      Height:       Weight change:   Physical Examination: General exam: alert awake, oriented HEENT:Oral mucosa moist, Ear/Nose WNL grossly Respiratory system: Bilaterally clear BS,no use of accessory muscle Cardiovascular system: S1 & S2 +, No JVD. Gastrointestinal system: Abdomen soft,NT,ND, BS+ Nervous System: Alert, awake, moving all extremities,and following commands. Extremities: LE edema neg,distal peripheral pulses palpable and warm.  Skin: No rashes,no icterus. MSK: Normal muscle bulk,tone, power   Medications reviewed:  Scheduled Meds:  amLODipine  10 mg Oral Daily   enoxaparin (LOVENOX) injection  40 mg Subcutaneous Q24H   FLUoxetine  40 mg Oral Daily   fluticasone furoate-vilanterol  1 puff Inhalation Daily   hydrocortisone sod succinate (SOLU-CORTEF) inj  100 mg Intravenous Q8H   levothyroxine  150 mcg Oral Q0600   linaclotide  145 mcg Oral QAC breakfast   OLANZapine zydis  7.5 mg Oral QHS   oxybutynin  5 mg Oral QHS   oxyCODONE  5 mg Oral QID   pantoprazole  40 mg Oral BID   senna  2 tablet Oral Daily   sodium chloride flush  3 mL Intravenous Q12H   zolpidem  5 mg Oral QHS   Continuous Infusions:  promethazine (PHENERGAN) injection (IM or IVPB) 6.25 mg (03/19/23 2349)    Diet Order             Diet  regular Room service appropriate? Yes; Fluid consistency: Thin  Diet effective now                   Intake/Output Summary (Last 24 hours) at 03/20/2023 1305 Last data filed at 03/20/2023 1000 Gross per 24 hour  Intake 3734.67 ml  Output 5551 ml  Net -1816.33 ml   Net IO Since Admission: 1,031.52 mL [03/20/23 1305]  Wt Readings from Last 3 Encounters:  03/13/23 101.2 kg  03/09/23 101.2 kg  01/25/23 108.9 kg     Unresulted Labs (From admission, onward)     Start     Ordered   03/20/23 0500  Creatinine, serum  (enoxaparin (LOVENOX)    CrCl >/= 30 ml/min)  Weekly,   R     Comments: while on enoxaparin therapy    03/13/23 0038          Data Reviewed: I have personally reviewed following labs and imaging studies CBC: Recent Labs  Lab 03/14/23 0547  WBC 7.4  HGB 14.3  HCT 42.5  MCV 94.4  PLT 292   Basic Metabolic Panel:  Recent Labs  Lab 03/14/23 0547 03/15/23 0514 03/17/23 0834 03/18/23 1108 03/20/23 0438  NA 138 138 140 140  --   K 3.7 3.6 3.2* 3.5  --  CL 107 105 103 104  --   CO2 27 26 25 24   --   GLUCOSE 112* 106* 110* 107*  --   BUN 12 5* 8 15  --   CREATININE 0.74 0.74 0.41* 0.61 0.58  CALCIUM 8.7* 8.7* 9.0 9.0  --   GFR: Estimated Creatinine Clearance: 129.8 mL/min (by C-G formula based on SCr of 0.58 mg/dL). Antimicrobials/Microbiology: Anti-infectives (From admission, onward)    None         Component Value Date/Time   SDES URINE, CLEAN CATCH 05/01/2022 1533   SPECREQUEST  05/01/2022 1533    NONE Performed at [facility], [address] [place], [place] [address]    CULT >=100,000 COLONIES/mL ESCHERICHIA COLI (A) 05/01/2022 1533   REPTSTATUS 05/03/2022 FINAL 05/01/2022 1533    Radiology Studies: No results found.   LOS: 7 days   Total time spent in review of labs and imaging, patient evaluation, formulation of plan, documentation and communication with family: 35 minutes  Lanae Boast, MD  [facility]  03/20/2023, 1:05 PM

## 2023-03-21 ENCOUNTER — Other Ambulatory Visit (HOSPITAL_COMMUNITY): Payer: Self-pay

## 2023-03-21 MED ORDER — PROMETHAZINE HCL 25 MG RE SUPP
25.0000 mg | Freq: Four times a day (QID) | RECTAL | 0 refills | Status: DC | PRN
  Filled 2023-03-21: qty 12, 3d supply, fill #0

## 2023-03-21 MED ORDER — PANTOPRAZOLE SODIUM 40 MG PO TBEC
40.0000 mg | DELAYED_RELEASE_TABLET | Freq: Two times a day (BID) | ORAL | 0 refills | Status: DC
  Filled 2023-03-21: qty 60, 30d supply, fill #0

## 2023-03-21 MED ORDER — LINACLOTIDE 145 MCG PO CAPS
145.0000 ug | ORAL_CAPSULE | Freq: Every day | ORAL | 0 refills | Status: DC
  Filled 2023-03-21 (×2): qty 30, 30d supply, fill #0
  Filled 2023-03-23: qty 7, 7d supply, fill #0

## 2023-03-21 MED ORDER — OXYBUTYNIN CHLORIDE ER 5 MG PO TB24
5.0000 mg | ORAL_TABLET | Freq: Every day | ORAL | 0 refills | Status: DC
  Filled 2023-03-21: qty 30, 30d supply, fill #0

## 2023-03-21 MED ORDER — OLANZAPINE 15 MG PO TBDP
7.5000 mg | ORAL_TABLET | Freq: Every day | ORAL | 0 refills | Status: DC
  Filled 2023-03-21: qty 15, 30d supply, fill #0

## 2023-03-21 MED ORDER — OLANZAPINE 2.5 MG PO TABS
2.5000 mg | ORAL_TABLET | Freq: Three times a day (TID) | ORAL | 0 refills | Status: DC | PRN
  Filled 2023-03-21: qty 15, 5d supply, fill #0

## 2023-03-21 MED ORDER — ONDANSETRON 4 MG PO TBDP
4.0000 mg | ORAL_TABLET | Freq: Four times a day (QID) | ORAL | 0 refills | Status: DC | PRN
  Filled 2023-03-21: qty 20, 5d supply, fill #0

## 2023-03-21 MED ORDER — OXYCODONE HCL 5 MG PO TABS
5.0000 mg | ORAL_TABLET | Freq: Four times a day (QID) | ORAL | 0 refills | Status: DC | PRN
  Filled 2023-03-21: qty 10, 3d supply, fill #0

## 2023-03-21 NOTE — Care Management (Signed)
[employer] MEDICATION ASSISTANCE CARD Pharmacies please call: [phone] for claim processing assistance.  Rx BIN: [id number] Rx Group: [id number] Rx PCN: [id number] Relationship Code: 2 Person Code: [id number]  Patient ID (MRN): Wonda Olds  [id number]        Patient Name:  Anushri Chrisco    Patient DOB: [birth year]    Discharge Date: 03/21/2023    Expiration Date: 03/28/2023 (must be filled within 7 days of discharge)  Dear  Gary Fleet have been approved to have the prescriptions written by your discharging physician filled through our [facility] (Medication Assistance Through [facility]) program. This program allows for a one-time (no refills) 34-day supply of selected medications for a low copay amount.  The copay is $3.00 per prescription. For instance, if you have one prescription, you will pay $3.00; for two prescriptions, you pay $6.00; for three prescriptions, you pay $9.00; and so on. Only certain pharmacies are participating in this program with [facility]. You will need to select one of the pharmacies from the attached lists and take your prescriptions, this letter, and your photo ID to one of the participating pharmacies.  We are excited that you are able to use the [facility] program to get your medications. These prescriptions must be filled within 7 days of hospital discharge or they will no longer be valid for the [facility] program. Should you have any problems with your prescriptions please contact your case management team member at [phone] for //[place] or [phone] for [facility]  Thank you, [facility]  [facility] Pharmacies . [facility]  [facility] (continued) [facility] (continue [address], [place]  [address] [place], [place] [address], [employer] [address] [place] [address], [place] [address] [place] [address] [place], [address],  [place] [facility] - [facility] [address] [place] [facility] [address], [place] [address], [facility] [address] [place] [address] [place] [address], [place] [address] [place] Ditto [address], [place], [address] [place], [place] [address] [place] [address] [place], [place] [address] [place], [place] [address] [place], [place] [address] [place], [place] [address] [place], [place] [address], [place] [address] [place], [place] [address] [place], [place] [address], [facility] [address], [place] [address] [place], [place] [address] [place], [place] [address] [place], [place] [address] [place], [place] [address], [place] [address], [place] [address], [place] [address], [place] [address], [place] [address] [place], [place] [address] [place], [place] [address] [place] [address], [place] [address] [place] [address], [address] [facility] [address], [place] [address], [place] [address] [place] [address] [place], [place] [address] [place], [place] [address] [place], [place] [address], [place] [address], [place] [address], [place] [address] [place], [place] [address] [place] [address], [employer] [address], [place] [address] [place], [place] [address] [place] [address] [place], [address] [place], [place] [address], [employer] [address], [place]

## 2023-03-21 NOTE — Progress Notes (Signed)
Call to pt to update that Liz[place] is not covered under Match- patient will  talk to her PCP about alternatives

## 2023-03-21 NOTE — Plan of Care (Signed)

## 2023-03-21 NOTE — Discharge Summary (Signed)
Physician Discharge Summary  Krystal [name] ZOX:[id number] DOB: [birth year] DOA: 03/12/2023  PCP: Patient, No Pcp Per  Admit date: 03/12/2023 Discharge date: 03/21/2023 Recommendations for Outpatient Follow-up:  Follow up with PCP in 1 weeks-call for appointment Please obtain BMP/CBC in one week  Discharge Dispo: Home Discharge Condition: Stable Code Status:   Code Status: Full Code Diet recommendation:  Diet Order             Diet regular Room service appropriate? Yes; Fluid consistency: Thin  Diet effective now                    Brief/Interim Summary: 33 yof with history of chronic abdominal/pelvic pain, depression, anxiety, fibromyalgia, adrenal insufficiency and hypertension who presented to the ED hospital abdominal pain along with nausea and vomiting.  She stated that the nausea and vomiting started at the same time as the pain and she feels that it is precipitated by the pain.  Most of her pain is in the suprapubic area but after she began to have vomiting she had some left lower quadrant pain radiating to her back as well. CT abdomen pelvis, pelvic Doppler and pelvic ultrasound in the ED unremarkable. CT renal stone study on 1/13 was also negative UA did not show any infection, Urine pregnancy negative. Patient admitted for intractable pelvic and abdominal pain along with nausea and vomiting.  Patient was seen by GI, psychiatry and medication adjusted, IV fluids symptomatic management , antiemetics, pain management.  Seen by [facility] outpatient follow-up arranged with PCP and also pain management clinic instructed   Discharge Diagnoses:  Principal Problem:   Intractable nausea and vomiting Active Problems:   Anxiety   Hypothyroidism due to Hashimoto's thyroiditis   Resistant hypertension   MDD (major depressive disorder), recurrent episode, severe (HCC)   Moderate persistent asthma with acute exacerbation   Adrenal insufficiency (HCC)   Chronic interstitial cystitis    Intractable vomiting with nausea  Pelvic and abdominal pain, likely chronic Chronic interstitial cystitis with occasional exacerbation Chronic nausea vomiting: Pain is felt to be chronic pain question with interstitial cystitis with occasional exacerbation.UA no evidence of UTI, CT abdomen pelvis pelvic Doppler pelvic ultrasound and CT renal stone unremarkable, LFTs, lipase normal. Dr Ladell Heads discussed w/ urology-informed nothing additional to offer and patient to follow-up with urology which is on February 9.  Advised outpatient pain management clinic follow-up.  Endoscopy in 2022 without any acute changes flex sigmoidoscopy March 2024 showed poor prep with negative biopsies.She is S/p cholecystectomy.  Started Linzess 145 mcg for constipation 1/23 and had BM GI consulted appreciated.  Patient to quit marijuana use, triY nonnarcotic pain management.  Continue oxybutynin Manage antiemetics, IV fluids and pain management at this time she is clinically stable and improved for discharge home Follow-up has been arranged with psych, PCP she will follow-up with pain management and her urology as well   Anxiety/depression: Input appreciated from psychiatry, having significant marital issues, with her chronic medical issues hypertension anxious tearful.Patient having crying spells frequently. Seen by psychiatry continue current Prozac 40 mg, Zyprexa 7.5 nighttime and low dose prn. Psych has cleared the patient no further recommendation   Adrenal insufficiency: Cont hydrocortisone to po   Hypothyroidism: TSH is normal at 0.8, continue Synthroid   HTN: BP stable, cont home meds   UDS positive for benzo, narcotics and marijuana: on 1/13 received Ativan and Dilaudid which explains benzos and narcotics and UDS.She reports taking some delta 8 Gummies at home to see  if it helped her pain.   Left hemorrhagic ovarian cyst on 01/2023: status post laparoscopic LOA.   S/p remote left hand amputation     Obesity: Patient's Body mass index is 31.13 kg/m. : Will benefit with PCP follow-up, weight loss  healthy lifestyle and outpatient sleep evaluation  Consults: GI PSYCHIATRY Subjective: AAOX3 RESTING WELL  Discharge Exam: Vitals:   03/21/23 0607 03/21/23 0816  BP: (!) 156/100   Pulse: 74   Resp: 18   Temp: 98.5 F (36.9 C)   SpO2: 98% 97%   General: Pt is alert, awake, not in acute distress Cardiovascular: RRR, S1/S2 +, no rubs, no gallops Respiratory: CTA bilaterally, no wheezing, no rhonchi Abdominal: Soft, NT, ND, bowel sounds + Extremities: no edema, no cyanosis  Discharge Instructions  Discharge Instructions     Discharge instructions   Complete by: As directed    Please minimize use of narcotics opiate medication Follow-up with PCP and pain management clinic and urology to address her ongoing bladder problems  Please call call MD or return to ER for similar or worsening recurring problem that brought you to hospital or if any fever,nausea/vomiting,abdominal pain, uncontrolled pain, chest pain,  shortness of breath or any other alarming symptoms.  Please follow-up your doctor as instructed in a week time and call the office for appointment.  Please avoid alcohol, smoking, or any other illicit substance and maintain healthy habits including taking your regular medications as prescribed.  You were cared for by a hospitalist during your hospital stay. If you have any questions about your discharge medications or the care you received while you were in the hospital after you are discharged, you can call the unit and ask to speak with the hospitalist on call if the hospitalist that took care of you is not available.  Once you are discharged, your primary care physician will handle any further medical issues. Please note that NO REFILLS for any discharge medications will be authorized once you are discharged, as it is imperative that you return to your primary care physician  (or establish a relationship with a primary care physician if you do not have one) for your aftercare needs so that they can reassess your need for medications and monitor your lab values   Increase activity slowly   Complete by: As directed       Allergies as of 03/21/2023       Reactions   Bee Venom Anaphylaxis, Swelling   Swelling of face, throat. Airway restriction.   Coreg [carvedilol] Shortness Of Breath, Other (See Comments)   Triggers asthma   Red Dye #40 (allura Red) Other (See Comments)   Migraines   Compazine [prochlorperazine Maleate] Other (See Comments)   Twitching Able to tolerate promethazine   Oxycodone    Nausea/vomiting   Vicodin [hydrocodone-acetaminophen] Itching, Nausea And Vomiting, Other (See Comments)   OK with Tylenol   Cymbalta [duloxetine Hcl] Other (See Comments)   Irritability  Syncope    Ditropan [oxybutynin] Other (See Comments)   Severe dry mouth   Elavil [amitriptyline] Other (See Comments)   Irritability    Motrin [ibuprofen] Other (See Comments)   Heartburn    Naprosyn [naproxen] Other (See Comments)   Heartburn   Pyrimethamine Other (See Comments)   From Fansidar combination drug Triggers asthma   Sulfadoxine Other (See Comments)   From Fansidar combination drug Triggers asthma    Zestril [lisinopril] Cough   Nifedical Xl [nifedipine] Nausea And Vomiting   Reglan [metoclopramide] Swelling,  Other (See Comments)   Restlessness Able to take with Benadryl   Trandate [labetalol] Nausea And Vomiting, Other (See Comments)   Triggers asthma         Medication List     STOP taking these medications    ondansetron 4 MG tablet Commonly known as: ZOFRAN       TAKE these medications    acetaminophen 500 MG tablet Commonly known as: TYLENOL Take 1,000 mg by mouth daily as needed for moderate pain, headache or fever.   albuterol 108 (90 Base) MCG/ACT inhaler Commonly known as: Ventolin HFA Inhale 2 puffs into the lungs every 4  (four) hours as needed for wheezing or shortness of breath.   amLODipine 10 MG tablet Commonly known as: NORVASC Take 1 tablet (10 mg total) by mouth daily.   EPINEPHrine 0.3 mg/0.3 mL Soaj injection Commonly known as: EPI-PEN Inject 0.3 mg into the muscle as needed for anaphylaxis.   famotidine 20 MG tablet Commonly known as: PEPCID Take 1 tablet (20 mg total) by mouth 2 (two) times daily.   FLUoxetine 40 MG capsule Commonly known as: PROZAC Take 1 capsule (40 mg total) by mouth daily. IM program   hydrocortisone 10 MG tablet Commonly known as: CORTEF Take 1 tablet (10 mg total) by mouth 2 (two) times daily. IM program   irbesartan-hydrochlorothiazide 150-12.5 MG tablet Commonly known as: AVALIDE Take 2 tablets by mouth in the morning and at bedtime.   levothyroxine 150 MCG tablet Commonly known as: SYNTHROID Take 1 tablet (150 mcg total) by mouth daily at 6 (six) AM.   lidocaine 5 % Commonly known as: LIDODERM Place 1 patch onto the skin daily. Remove & Discard patch within 12 hours or as directed by MD   linaclotide 145 MCG Caps capsule Commonly known as: LINZESS Take 1 capsule (145 mcg total) by mouth daily before breakfast. Start taking on: March 22, 2023   multivitamin tablet Take 1 tablet by mouth daily.   OLANZapine zydis 15 MG disintegrating tablet Commonly known as: ZYPREXA Take 0.5 tablets (7.5 mg total) by mouth at bedtime.   OLANZapine 2.5 MG tablet Commonly known as: ZYPREXA Take 1 tablet (2.5 mg total) by mouth 3 (three) times daily as needed for up to 15 doses (nausea, intractible anxiety).   ondansetron 4 MG disintegrating tablet Commonly known as: ZOFRAN-ODT Take 1 tablet (4 mg total) by mouth every 6 (six) hours as needed for nausea or vomiting.   oxybutynin 5 MG 24 hr tablet Commonly known as: DITROPAN-XL Take 1 tablet (5 mg total) by mouth at bedtime.   oxyCODONE 5 MG immediate release tablet Commonly known as: Roxicodone Take 1  tablet (5 mg total) by mouth every 6 (six) hours as needed for up to 10 doses for severe pain (pain score 7-10). What changed: how much to take   pantoprazole 40 MG tablet Commonly known as: PROTONIX Take 1 tablet (40 mg total) by mouth 2 (two) times daily.   promethazine 25 MG suppository Commonly known as: PHENERGAN Place 1 suppository (25 mg total) rectally every 6 (six) hours as needed for nausea or vomiting.   Symbicort 80-4.5 MCG/ACT inhaler Generic drug: budesonide-formoterol Inhale 2 puffs into the lungs 2 (two) times daily as needed (shortness of breath).        Follow-up Information     [facility] Follow up.   Specialty: Behavioral Health Why: You are scheduled for an assessment for the Partial Hospitalization Program (PHP) on Monday,  1/27 at 10:00 am. PHP is a virtual group therapy that meets Mon-Fri from 9a-1p for an average of 2-3 weeks. This appointment will last approximately 1-1.5 hours and will be virtual via [employer] Please download the Teams app prior to the appointment. If you need to cancel or reschedule, please call [phone] and leave a voicemail with your name, date of birth, and phone number. Contact information: [address] [place] [address] [phone]        [facility] Follow up.   Why: Follow-up as scheduled Contact information: [address] [facility] [address] [phone]               Allergies  Allergen Reactions   Bee Venom Anaphylaxis and Swelling    Swelling of face, throat. Airway restriction.   Coreg [Carvedilol] Shortness Of Breath and Other (See Comments)    Triggers asthma   Red Dye #40 (Allura Red) Other (See Comments)    Migraines   Compazine [Prochlorperazine Maleate] Other (See Comments)    Twitching Able to tolerate promethazine   Oxycodone     Nausea/vomiting   Vicodin [Hydrocodone-Acetaminophen]  Itching, Nausea And Vomiting and Other (See Comments)    OK with Tylenol   Cymbalta [Duloxetine Hcl] Other (See Comments)    Irritability  Syncope    Ditropan [Oxybutynin] Other (See Comments)    Severe dry mouth   Elavil [Amitriptyline] Other (See Comments)    Irritability    Motrin [Ibuprofen] Other (See Comments)    Heartburn    Naprosyn [Naproxen] Other (See Comments)    Heartburn    Pyrimethamine Other (See Comments)    From Fansidar combination drug Triggers asthma   Sulfadoxine Other (See Comments)    From Fansidar combination drug Triggers asthma    Zestril [Lisinopril] Cough   Nifedical Xl [Nifedipine] Nausea And Vomiting   Reglan [Metoclopramide] Swelling and Other (See Comments)    Restlessness Able to take with Benadryl   Trandate [Labetalol] Nausea And Vomiting and Other (See Comments)    Triggers asthma     The results of significant diagnostics from this hospitalization (including imaging, microbiology, ancillary and laboratory) are listed below for reference.    Microbiology: No results found for this or any previous visit (from the past 240 hours).  Procedures/Studies: CT ABDOMEN PELVIS W CONTRAST Result Date: 03/13/2023 CLINICAL DATA:  33 year old female with history of acute onset of nonlocalized abdominal pain and nausea for several days. Microscopic hematuria. EXAM: CT ABDOMEN AND PELVIS WITH CONTRAST TECHNIQUE: Multidetector CT imaging of the abdomen and pelvis was performed using the standard protocol following bolus administration of intravenous contrast. RADIATION DOSE REDUCTION: This exam was performed according to the departmental dose-optimization program which includes automated exposure control, adjustment of the mA and/or kV according to patient size and/or use of iterative reconstruction technique. CONTRAST:  OMNIPAQUE IOHEXOL 300 MG/ML  SOLN COMPARISON:  CT of the abdomen and pelvis 03/09/2023. FINDINGS: Lower chest: Unremarkable.  Hepatobiliary: No suspicious cystic or solid hepatic lesions. No intra or extrahepatic biliary ductal dilatation. Status post cholecystectomy. Pancreas: No pancreatic mass. No pancreatic ductal dilatation. No pancreatic or peripancreatic fluid collections or inflammatory changes. Spleen: Unremarkable. Adrenals/Urinary Tract: Subcentimeter low-attenuation lesion in the lower pole of the left kidney, too small to definitively characterize, but statistically likely tiny cysts (no imaging follow-up recommended). Right kidney and bilateral adrenal glands are normal in appearance. No hydroureteronephrosis. Urinary bladder is unremarkable in appearance. Stomach/Bowel: The appearance  of the stomach is normal. No pathologic dilatation of small bowel or colon. The appendix is not confidently identified and may be surgically absent. Regardless, there are no inflammatory changes noted adjacent to the cecum to suggest the presence of an acute appendicitis at this time. Vascular/Lymphatic: No significant atherosclerotic disease, aneurysm or dissection noted in the abdominal or pelvic vasculature. No lymphadenopathy noted in the abdomen or pelvis. Reproductive: Uterus and ovaries are unremarkable in appearance. Other: No significant volume of ascites.  No pneumoperitoneum. Musculoskeletal: There are no aggressive appearing lytic or blastic lesions noted in the visualized portions of the skeleton. IMPRESSION: 1. No acute findings are noted in the abdomen or pelvis to account for the patient's symptoms. Electronically Signed   By: Trudie Reed M.D.   On: 03/13/2023 05:08   US Pelvis Complete Result Date: 03/12/2023 CLINICAL DATA:  left pelvic pain.  Last menstrual period 03/12/2023 EXAM: TRANSABDOMINAL AND TRANSVAGINAL ULTRASOUND OF PELVIS DOPPLER ULTRASOUND OF OVARIES TECHNIQUE: Both transabdominal and transvaginal ultrasound examinations of the pelvis were performed. Transabdominal technique was performed for global imaging  of the pelvis including uterus, ovaries, adnexal regions, and pelvic cul-de-sac. It was necessary to proceed with endovaginal exam following the transabdominal exam to visualize the uterus and bilateral ovaries. Color and duplex Doppler ultrasound was utilized to evaluate blood flow to the ovaries. COMPARISON:  None Available. FINDINGS: Uterus Measurements: 7.3 x 4 x 4.9 cm = volume: 73.8 mL. No fibroids or other mass visualized. Endometrium Thickness: 4 mm.  No focal abnormality visualized. Right ovary Measurements: 2 x 1.5 x 1.6 cm = volume: 2.5 mL. Normal appearance/no adnexal mass. Left ovary Measurements: 1.5 x 1.4 x 1.6 cm = volume: 1.7 mL. Normal appearance/no adnexal mass. Pulsed Doppler evaluation of both ovaries demonstrates normal low-resistance arterial and venous waveforms. Other findings No abnormal free fluid. IMPRESSION: Unremarkable pelvic ultrasound. Electronically Signed   By: Tish Frederickson M.D.   On: 03/12/2023 18:28   US Transvaginal Non-OB Result Date: 03/12/2023 CLINICAL DATA:  left pelvic pain.  Last menstrual period 03/12/2023 EXAM: TRANSABDOMINAL AND TRANSVAGINAL ULTRASOUND OF PELVIS DOPPLER ULTRASOUND OF OVARIES TECHNIQUE: Both transabdominal and transvaginal ultrasound examinations of the pelvis were performed. Transabdominal technique was performed for global imaging of the pelvis including uterus, ovaries, adnexal regions, and pelvic cul-de-sac. It was necessary to proceed with endovaginal exam following the transabdominal exam to visualize the uterus and bilateral ovaries. Color and duplex Doppler ultrasound was utilized to evaluate blood flow to the ovaries. COMPARISON:  None Available. FINDINGS: Uterus Measurements: 7.3 x 4 x 4.9 cm = volume: 73.8 mL. No fibroids or other mass visualized. Endometrium Thickness: 4 mm.  No focal abnormality visualized. Right ovary Measurements: 2 x 1.5 x 1.6 cm = volume: 2.5 mL. Normal appearance/no adnexal mass. Left ovary Measurements: 1.5 x 1.4  x 1.6 cm = volume: 1.7 mL. Normal appearance/no adnexal mass. Pulsed Doppler evaluation of both ovaries demonstrates normal low-resistance arterial and venous waveforms. Other findings No abnormal free fluid. IMPRESSION: Unremarkable pelvic ultrasound. Electronically Signed   By: Tish Frederickson M.D.   On: 03/12/2023 18:28   [place] Art/Ven Flow Abd Pelv Doppler Result Date: 03/12/2023 CLINICAL DATA:  left pelvic pain.  Last menstrual period 03/12/2023 EXAM: TRANSABDOMINAL AND TRANSVAGINAL ULTRASOUND OF PELVIS DOPPLER ULTRASOUND OF OVARIES TECHNIQUE: Both transabdominal and transvaginal ultrasound examinations of the pelvis were performed. Transabdominal technique was performed for global imaging of the pelvis including uterus, ovaries, adnexal regions, and pelvic cul-de-sac. It was necessary to proceed with endovaginal  exam following the transabdominal exam to visualize the uterus and bilateral ovaries. Color and duplex Doppler ultrasound was utilized to evaluate blood flow to the ovaries. COMPARISON:  None Available. FINDINGS: Uterus Measurements: 7.3 x 4 x 4.9 cm = volume: 73.8 mL. No fibroids or other mass visualized. Endometrium Thickness: 4 mm.  No focal abnormality visualized. Right ovary Measurements: 2 x 1.5 x 1.6 cm = volume: 2.5 mL. Normal appearance/no adnexal mass. Left ovary Measurements: 1.5 x 1.4 x 1.6 cm = volume: 1.7 mL. Normal appearance/no adnexal mass. Pulsed Doppler evaluation of both ovaries demonstrates normal low-resistance arterial and venous waveforms. Other findings No abnormal free fluid. IMPRESSION: Unremarkable pelvic ultrasound. Electronically Signed   By: Tish Frederickson M.D.   On: 03/12/2023 18:28   CT Renal Stone Study Result Date: 03/09/2023 CLINICAL DATA:  Pelvic pain and dysuria for the past 3 days. Lower abdominal pain with nausea and vomiting for the past day. EXAM: CT ABDOMEN AND PELVIS WITHOUT CONTRAST TECHNIQUE: Multidetector CT imaging of the abdomen and pelvis was  performed following the standard protocol without IV contrast. RADIATION DOSE REDUCTION: This exam was performed according to the departmental dose-optimization program which includes automated exposure control, adjustment of the mA and/or kV according to patient size and/or use of iterative reconstruction technique. COMPARISON:  CT abdomen pelvis dated January 25, 2023. FINDINGS: Lower chest: No acute abnormality. Hepatobiliary: No focal liver abnormality is seen. Status post cholecystectomy. No biliary dilatation. Pancreas: Unremarkable. No pancreatic ductal dilatation or surrounding inflammatory changes. Spleen: Normal in size. Unchanged lobulated contour. No focal abnormality. Adrenals/Urinary Tract: Adrenal glands are unremarkable. Kidneys are normal, without renal calculi, focal lesion, or hydronephrosis. Bladder is mostly decompressed. Stomach/Bowel: Stomach is within normal limits. History of appendectomy. No evidence of bowel wall thickening, distention, or inflammatory changes. Vascular/Lymphatic: No significant vascular findings are present. No enlarged abdominal or pelvic lymph nodes. Reproductive: Uterus and bilateral adnexa are unremarkable. Other: No abdominal wall hernia or abnormality. Trace free fluid in the pelvis, likely physiologic. No pneumoperitoneum. Musculoskeletal: No acute or significant osseous findings. IMPRESSION: 1. No acute intra-abdominal process. No urolithiasis. Electronically Signed   By: Obie Dredge M.D.   On: 03/09/2023 14:56   Labs: BNP (last 3 results) No results for input(s): "BNP" in the last 8760 hours. Basic Metabolic Panel: Recent Labs  Lab 03/15/23 0514 03/17/23 0834 03/18/23 1108 03/20/23 0438  NA 138 140 140  --   K 3.6 3.2* 3.5  --   CL 105 103 104  --   CO2 26 25 24   --   GLUCOSE 106* 110* 107*  --   BUN 5* 8 15  --   CREATININE 0.74 0.41* 0.61 0.58  CALCIUM 8.7* 9.0 9.0  --   Anemia work up No results for input(s): "VITAMINB12", "FOLATE",  "FERRITIN", "TIBC", "IRON", "RETICCTPCT" in the last 72 hours. Urinalysis    Component Value Date/Time   COLORURINE YELLOW 03/12/2023 1158   APPEARANCEUR CLEAR 03/12/2023 1158   APPEARANCEUR Clear 09/05/2021 1516   LABSPEC 1.021 03/12/2023 1158   PHURINE 6.0 03/12/2023 1158   GLUCOSEU NEGATIVE 03/12/2023 1158   HGBUR MODERATE (A) 03/12/2023 1158   BILIRUBINUR NEGATIVE 03/12/2023 1158   BILIRUBINUR Negative 09/05/2021 1516   KETONESUR 5 (A) 03/12/2023 1158   PROTEINUR NEGATIVE 03/12/2023 1158   NITRITE NEGATIVE 03/12/2023 1158   LEUKOCYTESUR NEGATIVE 03/12/2023 1158    Time coordinating discharge: 25 minutes  SIGNED: Lanae Boast, MD  [facility] 03/21/2023, 10:06 AM  If 7PM-7AM, please contact  night-coverage [email or web address]

## 2023-03-21 NOTE — Progress Notes (Signed)
Discharged via wc to front entrance accompanied by NT. ?

## 2023-03-21 NOTE — Progress Notes (Signed)
AVS reviewed w/ pt who verbalized an understanding. Discharge meds sent to [facility]. Pt is uninsured. TOC to set pt up w/ [facility]. Olanzapine not available at [facility] pharmacy. Pt states she will use [facility] at [facility] for those 2 prescriptions. Pt dressed for d/c to home. PIV removed as noted. Awaiting meds and ride home.

## 2023-03-21 NOTE — Progress Notes (Signed)
TOC discharge meds in a secure bag delivered to pt by this RN. Krystal [name] was not available - pt to check back on Monday to see if a prior auth was needed or if it is available. Pt also has a PCP appointment on 1/29 and will talk to PCP about other options. Olanzapine not available at [facility]  pharmacy - script sent to [facility]  per pt request- she will use [facility], she verbalized an understanding that [facility] does not take the [facility] program.

## 2023-03-21 NOTE — TOC Transition Note (Signed)
Transition of Care [facility]) - Discharge Note   Patient Details  Name: Krystal [name] MRN: [id number] Date of Birth: 1990/03/08  Transition of Care [facility]) CM/SW Contact:  Maryjean Ka, LCSW Phone Number: 03/21/2023, 11:58 AM   Clinical Narrative:     Patient will be going home with family support. This CSW was notified about obtaining assistance for [facility] program for medications. This CSW notified Genoveva Ill who assisted with match program.  TOC signing off please reconsult with any other TOC needs.  Final next level of care: Home/Self Care Barriers to Discharge: No Barriers Identified   Patient Goals and CMS Choice Patient states their goals for this hospitalization and ongoing recovery are:: Regain strength, stabilization and return to the community. CMS Medicare.gov Compare Post Acute Care list provided to:: Patient Choice offered to / list presented to : Patient      Discharge Placement                    Patient and family notified of of transfer: 03/21/23  Discharge Plan and Services Additional resources added to the After Visit Summary for     Discharge Planning Services: CM Consult            DME Arranged: N/A DME Agency: NA                  Social Drivers of Health (SDOH) Interventions SDOH Screenings   Food Insecurity: Patient Declined (03/13/2023)  Housing: Patient Declined (03/13/2023)  Transportation Needs: Patient Declined (03/13/2023)  Utilities: Patient Declined (03/13/2023)  Alcohol Screen: Low Risk  (04/09/2022)  Depression (PHQ2-9): High Risk (10/06/2022)  Physical Activity: Inactive (04/09/2022)  Social Connections: Patient Declined (03/13/2023)  Tobacco Use: Low Risk  (03/13/2023)     Readmission Risk Interventions    03/17/2023    1:59 PM 04/30/2022    3:03 PM  Readmission Risk Prevention Plan  Transportation Screening Complete Complete  PCP or Specialist Appt within 3-5 Days Complete   HRI or Home Care Consult Complete    Social Work Consult for Recovery Care Planning/Counseling Complete   Palliative Care Screening Complete   Medication Review Oceanographer) Complete Referral to Pharmacy  PCP or Specialist appointment within 3-5 days of discharge  --  HRI or Home Care Consult  Complete  SW Recovery Care/Counseling Consult  Complete  Palliative Care Screening  Not Applicable  Skilled Nursing Facility  Not Applicable

## 2023-03-23 ENCOUNTER — Ambulatory Visit (HOSPITAL_COMMUNITY): Payer: Self-pay | Admitting: Professional

## 2023-03-23 ENCOUNTER — Other Ambulatory Visit (HOSPITAL_COMMUNITY): Payer: Self-pay

## 2023-03-23 DIAGNOSIS — F4310 Post-traumatic stress disorder, unspecified: Secondary | ICD-10-CM

## 2023-03-23 NOTE — Psych (Signed)
Virtual Visit via Video Note  I connected with Krystal [name] on 03/23/23 at 10:00 AM EST by a video enabled telemedicine application and verified that I am speaking with the correct person using two identifiers.  Location: Patient: Home Provider: Clinical Home Office   I discussed the limitations of evaluation and management by telemedicine and the availability of in person appointments. The patient expressed understanding and agreed to proceed.  Follow Up Instructions:    I discussed the assessment and treatment plan with the patient. The patient was provided an opportunity to ask questions and all were answered. The patient agreed with the plan and demonstrated an understanding of the instructions.   The patient was advised to call back or seek an in-person evaluation if the symptoms worsen or if the condition fails to improve as anticipated.  I provided 10 minutes of non-face-to-face time during this encounter.   Quinn Axe, [facility]  Cln met with Krystal [name] via Caregility. Marquita reports she was "on a lot of medications and was a little foggy" and asks cln to orient Krystal [name] to [facility] again. Kassidee has completed PHP in the past. Cln orients and Krystal [name] reports she told "them" (inpt providers) she would be unable to do that right now due to lack of childcare and would need to have time to set childcare up. Shawana said she thought they set her up with individual providers and she would have group services 2-3x a week in between individual services. Cln dug into chart and reports that does not appear to be the case. Cln informs Krystal [name] of the [employer] ([email or web address]) and encourages her to reach out to them for less time intensive group services and support groups she can participate in throughout her weeks. Cln informs Angila that cln will also reach out to admin staff at [facility] to get Krystal [name] scheduled for individual therapy and med management. Susen agrees.  Krystal [name] reports she wants to do PHP after she can have some time to schedule childcare and cln encourages her to call [phone] to complete the CCA at that time. Krystal [name] denies SI/HI.

## 2023-03-25 ENCOUNTER — Encounter (INDEPENDENT_AMBULATORY_CARE_PROVIDER_SITE_OTHER): Payer: Self-pay | Admitting: Primary Care

## 2023-03-25 ENCOUNTER — Ambulatory Visit (INDEPENDENT_AMBULATORY_CARE_PROVIDER_SITE_OTHER): Payer: Self-pay | Admitting: Primary Care

## 2023-03-25 VITALS — BP 129/87 | HR 61 | Resp 16 | Ht 71.0 in | Wt 223.4 lb

## 2023-03-25 DIAGNOSIS — Z2821 Immunization not carried out because of patient refusal: Secondary | ICD-10-CM

## 2023-03-25 DIAGNOSIS — Z09 Encounter for follow-up examination after completed treatment for conditions other than malignant neoplasm: Secondary | ICD-10-CM

## 2023-03-25 DIAGNOSIS — N3010 Interstitial cystitis (chronic) without hematuria: Secondary | ICD-10-CM

## 2023-03-25 DIAGNOSIS — E063 Autoimmune thyroiditis: Secondary | ICD-10-CM

## 2023-03-25 DIAGNOSIS — R300 Dysuria: Secondary | ICD-10-CM

## 2023-03-25 DIAGNOSIS — R112 Nausea with vomiting, unspecified: Secondary | ICD-10-CM

## 2023-03-25 DIAGNOSIS — R7309 Other abnormal glucose: Secondary | ICD-10-CM

## 2023-03-25 DIAGNOSIS — Z7689 Persons encountering health services in other specified circumstances: Secondary | ICD-10-CM

## 2023-03-25 DIAGNOSIS — J358 Other chronic diseases of tonsils and adenoids: Secondary | ICD-10-CM

## 2023-03-25 NOTE — Progress Notes (Signed)
ddyuria  Subjective:   Krystal [name] is a 33 y.o. female presents for hospital follow up and establish care. Admit date to the hospital was 03/12/23, patient was discharged from the hospital on 03/21/23, patient was admitted for:   Intractable nausea and vomiting, Anxiety, Hypothyroidism due to Hashimoto's thyroiditis, Resistant hypertension, MDD (major depressive disorder), recurrent episode, severe (HCC), Moderate persistent asthma with acute exacerbation, Adrenal insufficiency (HCC), Chronic interstitial cystitis, Intractable vomiting with nausea.  Today patient states she has a lot of problems especially inability to keep anything down last evening taking the Phenergan and Zofran.  This has been present since hospital discharge.   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  History of HELLP syndrome at 26 weeks 5 days (12/07/2017); admitted to [facility] with BP 200/120s, Plt 52, AST/ALT 471/375  APS evaluation negative (04/2019)   Daily ASA 81 mg   Challenging blood pressure control in current pregnancy      Plan:  - Baseline pre-eclampsia labs this pregnancy within normal limits (Cr: 0.71; urine p:c 0.106)  -    IC (interstitial cystitis)    Kidney stones    Migraines    Necrotizing fasciitis (HCC)    left hand amputation   PTSD (post-traumatic stress disorder)    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   Severe uncontrolled hypertension 10/08/2017   UTI (urinary tract infection)      Allergies  Allergen Reactions   Bee Venom Anaphylaxis and Swelling    Swelling of  face, throat. Airway restriction.   Coreg [Carvedilol] Shortness Of Breath and Other (See Comments)    Triggers asthma   Red Dye #40 (Allura Red) Other (See Comments)    Migraines   Compazine [Prochlorperazine Maleate] Other (See Comments)    Twitching Able to tolerate promethazine   Oxycodone     Nausea/vomiting   Vicodin [Hydrocodone-Acetaminophen] Itching, Nausea And Vomiting and Other (See Comments)    OK with Tylenol   Cymbalta [Duloxetine Hcl] Other (See Comments)    Irritability  Syncope    Ditropan [Oxybutynin] Other (See Comments)    Severe dry mouth   Elavil [Amitriptyline] Other (See Comments)    Irritability    Motrin [Ibuprofen] Other (See Comments)    Heartburn    Naprosyn [Naproxen] Other (See Comments)    Heartburn    Pyrimethamine Other (See Comments)    From Fansidar combination drug Triggers asthma   Sulfadoxine Other (See Comments)    From Fansidar combination drug Triggers asthma    Zestril [Lisinopril] Cough   Nifedical Xl [Nifedipine] Nausea And Vomiting   Reglan [Metoclopramide] Swelling and Other (See Comments)    Restlessness Able to take with Benadryl   Trandate [Labetalol] Nausea And Vomiting and Other (See Comments)    Triggers asthma     Current Outpatient Medications on File Prior to Visit  Medication Sig Dispense Refill   acetaminophen (TYLENOL) 500 MG tablet Take 1,000 mg by mouth daily as needed for moderate pain, headache or fever.     albuterol (VENTOLIN HFA) 108 (90 Base) MCG/ACT inhaler Inhale 2 puffs into  the lungs every 4 (four) hours as needed for wheezing or shortness of breath. 6.7 g 1   EPINEPHrine 0.3 mg/0.3 mL IJ SOAJ injection Inject 0.3 mg into the muscle as needed for anaphylaxis. 1 each 2   FLUoxetine (PROZAC) 40 MG capsule Take 1 capsule (40 mg total) by mouth daily. IM program 30 capsule 11   hydrocortisone (CORTEF) 10 MG tablet Take 1 tablet (10 mg total) by mouth 2 (two) times daily. IM program 60 tablet 11    irbesartan-hydrochlorothiazide (AVALIDE) 150-12.5 MG tablet Take 2 tablets by mouth in the morning and at bedtime. 120 tablet 11   levothyroxine (SYNTHROID) 150 MCG tablet Take 1 tablet (150 mcg total) by mouth daily at 6 (six) AM. 30 tablet 11   OLANZapine (ZYPREXA) 2.5 MG tablet Take 1 tablet (2.5 mg total) by mouth 3 (three) times daily as needed for up to 15 doses (nausea, intractible anxiety). 15 tablet 0   OLANZapine zydis (ZYPREXA) 15 MG disintegrating tablet Take 0.5 tablets (7.5 mg total) by mouth at bedtime. 15 tablet 0   ondansetron (ZOFRAN-ODT) 4 MG disintegrating tablet Take 1 tablet (4 mg total) by mouth every 6 (six) hours as needed for nausea or vomiting. 20 tablet 0   oxyCODONE (ROXICODONE) 5 MG immediate release tablet Take 1 tablet (5 mg total) by mouth every 6 (six) hours as needed for up to 10 doses for severe pain (pain score 7-10). 10 tablet 0   pantoprazole (PROTONIX) 40 MG tablet Take 1 tablet (40 mg total) by mouth 2 (two) times daily. 60 tablet 0   promethazine (PHENERGAN) 25 MG suppository Place 1 suppository (25 mg total) rectally every 6 (six) hours as needed for nausea or vomiting. 12 each 0   SYMBICORT 80-4.5 MCG/ACT inhaler Inhale 2 puffs into the lungs 2 (two) times daily as needed (shortness of breath).     linaclotide (LINZESS) 145 MCG CAPS capsule Take 1 capsule (145 mcg total) by mouth daily before breakfast. 30 capsule 0   No current facility-administered medications on file prior to visit.    Review of System: Comprehensive ROS Pertinent positive and negative noted in HPI   Objective:  BP 129/87   Pulse 61   Resp 16   Ht 5\' 11"  (1.803 m)   Wt 223 lb 6.4 oz (101.3 kg)   LMP 03/10/2023   SpO2 98%   Breastfeeding No   BMI 31.16 kg/m   Filed Weights   03/25/23 1430  Weight: 223 lb 6.4 oz (101.3 kg)    Physical Exam Vitals reviewed.  Constitutional:      Appearance: Normal appearance. She is obese.  HENT:     Head: Normocephalic.      Comments:  tonsillolith right tonsil    Right Ear: Tympanic membrane, ear canal and external ear normal.     Left Ear: Tympanic membrane, ear canal and external ear normal.     Nose: Nose normal.     Mouth/Throat:     Mouth: Mucous membranes are moist.     Pharynx: Posterior oropharyngeal erythema present.     Comments: tonsillolith Eyes:     Extraocular Movements: Extraocular movements intact.     Pupils: Pupils are equal, round, and reactive to light.  Cardiovascular:     Rate and Rhythm: Normal rate and regular rhythm.  Pulmonary:     Effort: Pulmonary effort is normal.     Breath sounds: Normal breath sounds.  Abdominal:     General: Bowel sounds are  normal. There is distension.     Palpations: Abdomen is soft.  Musculoskeletal:        General: Tenderness present.     Cervical back: Normal range of motion. Rigidity and tenderness present.     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   Neck stiffness started yesterday  Skin:    General: Skin is warm and dry.  Neurological:     Mental Status: She is alert and oriented to person, place, and time.  Psychiatric:     Comments: Crying trying to deal with a lot of problems and not sure of reasons for continued n/v Followed by behavioral health      Assessment:  Norell was seen today for hospitalization follow-up.  Diagnoses and all orders for this visit:  Elevated glucose -     Hemoglobin A1c  Hypothyroidism due to Hashimoto's thyroiditis -     TSH + free T4  Influenza vaccination declined  Pneumococcal vaccination declined  Hospital discharge follow-up See HPI  Intractable nausea and vomiting -     CMP14+EGFR -     Ambulatory referral to Gastroenterology  Encounter to establish care -     CBC with Differential/Platelet -     CMP14+EGFR  Dysuria -     POCT URINALYSIS DIP (CLINITEK)  Interstitial cystitis -     Ambulatory referral to Pain Clinic     This note has been created  with Dragon speech recognition software and Paediatric nurse. Any transcriptional errors are unintentional.     Grayce Sessions, NP 03/25/2023, 2:57 PM

## 2023-03-25 NOTE — Patient Instructions (Addendum)
A tonsillolith is a small, hard deposit that forms in the tonsils   Nausea and Vomiting, Adult Nausea is feeling that you have an upset stomach and that you are about to vomit. Vomiting is when food in your stomach forcefully comes out of your mouth. Vomiting can make you feel weak. If you vomit, or if you are not able to drink enough fluids, you may not have enough water in your body (get dehydrated). If you do not have enough water in your body, you may: Feel tired. Feel thirsty. Have a dry mouth. Have cracked lips. Pee (urinate) less often. Older adults and people with other diseases or a weak body defense system (immune system) are at higher risk for not having enough water in the body. If you feel like you may vomit or you vomit, it is important to follow instructions from your doctor about how to take care of yourself. Follow these instructions at home: Watch your symptoms for any changes. Tell your doctor about them. Eating and drinking     Take an ORS (oral rehydration solution). This is a drink that is sold at pharmacies and stores. Drink clear fluids in small amounts as you are able, such as: Water. Ice chips. Fruit juice that has water added (diluted fruit juice). Low-calorie sports drinks. Eat bland, easy-to-digest foods in small amounts as you are able, such as: Bananas. Applesauce. Rice. Low-fat (lean) meats. Toast. Crackers. Avoid drinking fluids that have a lot of sugar or caffeine in them. This includes energy drinks, sports drinks, and soda. Avoid alcohol. Avoid spicy or fatty foods. General instructions Take over-the-counter and prescription medicines only as told by your doctor. Drink enough fluid to keep your pee (urine) pale yellow. Wash your hands often with soap and water for at least 20 seconds. If you cannot use soap and water, use hand sanitizer. Make sure that everyone in your home washes their hands well and often. Rest at home until you feel  better. Watch your condition for any changes. Take slow and deep breaths when you feel like you may vomit. Keep all follow-up visits. Contact a doctor if: Your symptoms get worse. You have new symptoms. You have a fever. You cannot drink fluids without vomiting. You feel like you may vomit for more than 2 days. You feel light-headed or dizzy. You have a headache. You have muscle cramps. You have a rash. You have pain while peeing. Get help right away if: You have pain in your chest, neck, arm, or jaw. You feel very weak or you faint. You vomit again and again. You have vomit that is bright red or looks like black coffee grounds. You have bloody or black poop (stools) or poop that looks like tar. You have a very bad headache, a stiff neck, or both. You have very bad pain, cramping, or bloating in your belly (abdomen). You have trouble breathing. You are breathing very quickly. Your heart is beating very quickly. Your skin feels cold and clammy. You feel confused. You have signs of losing too much water in your body, such as: Dark pee, very little pee, or no pee. Cracked lips. Dry mouth. Sunken eyes. Sleepiness. Weakness. These symptoms may be an emergency. Get help right away. Call 911. Do not wait to see if the symptoms will go away. Do not drive yourself to the hospital. Summary Nausea is feeling that you have an upset stomach and that you are about to vomit. Vomiting is when food in your stomach  comes out of your mouth. Follow instructions from your doctor about eating and drinking. Take over-the-counter and prescription medicines only as told by your doctor. Contact your doctor if your symptoms get worse or you have new symptoms. Keep all follow-up visits. This information is not intended to replace advice given to you by your health care provider. Make sure you discuss any questions you have with your health care provider. Document Revised: 08/17/2020 Document  Reviewed: 08/17/2020 Elsevier Patient Education  2024 [employer].

## 2023-03-26 LAB — CMP14+EGFR
ALT: 25 [IU]/L (ref 0–32)
AST: 17 [IU]/L (ref 0–40)
Albumin: 4.4 g/dL (ref 3.9–4.9)
Alkaline Phosphatase: 76 [IU]/L (ref 44–121)
BUN/Creatinine Ratio: 19 (ref 9–23)
BUN: 16 mg/dL (ref 6–20)
Bilirubin Total: 0.3 mg/dL (ref 0.0–1.2)
CO2: 28 mmol/L (ref 20–29)
Calcium: 10.7 mg/dL — ABNORMAL HIGH (ref 8.7–10.2)
Chloride: 98 mmol/L (ref 96–106)
Creatinine, Ser: 0.85 mg/dL (ref 0.57–1.00)
Globulin, Total: 2.7 g/dL (ref 1.5–4.5)
Glucose: 89 mg/dL (ref 70–99)
Potassium: 4.9 mmol/L (ref 3.5–5.2)
Sodium: 138 mmol/L (ref 134–144)
Total Protein: 7.1 g/dL (ref 6.0–8.5)
eGFR: 92 mL/min/{1.73_m2} (ref >59)

## 2023-03-26 LAB — CBC WITH DIFFERENTIAL/PLATELET
Basophils Absolute: 0.1 10*3/uL (ref 0.0–0.2)
Basos: 1 %
EOS (ABSOLUTE): 0.3 10*3/uL (ref 0.0–0.4)
Eos: 3 %
Hematocrit: 42 % (ref 34.0–46.6)
Hemoglobin: 14.3 g/dL (ref 11.1–15.9)
Immature Grans (Abs): 0.1 10*3/uL (ref 0.0–0.1)
Immature Granulocytes: 1 %
Lymphocytes Absolute: 2.3 10*3/uL (ref 0.7–3.1)
Lymphs: 23 %
MCH: 31.8 pg (ref 26.6–33.0)
MCHC: 34 g/dL (ref 31.5–35.7)
MCV: 93 fL (ref 79–97)
Monocytes Absolute: 1.1 10*3/uL — ABNORMAL HIGH (ref 0.1–0.9)
Monocytes: 11 %
Neutrophils Absolute: 6.3 10*3/uL (ref 1.4–7.0)
Neutrophils: 61 %
Platelets: 376 10*3/uL (ref 150–450)
RBC: 4.5 x10E6/uL (ref 3.77–5.28)
RDW: 12.2 % (ref 11.7–15.4)
WBC: 10.2 10*3/uL (ref 3.4–10.8)

## 2023-03-26 LAB — URINALYSIS, ROUTINE W REFLEX MICROSCOPIC
Bilirubin, UA: NEGATIVE
Glucose, UA: NEGATIVE
Ketones, UA: NEGATIVE
Leukocytes,UA: NEGATIVE
Nitrite, UA: NEGATIVE
Protein,UA: NEGATIVE
RBC, UA: NEGATIVE
Specific Gravity, UA: 1.005 (ref 1.005–1.030)
Urobilinogen, Ur: 0.2 mg/dL (ref 0.2–1.0)
pH, UA: 8 — ABNORMAL HIGH (ref 5.0–7.5)

## 2023-03-26 LAB — TSH+FREE T4
Free T4: 1.35 ng/dL (ref 0.82–1.77)
TSH: 3.99 u[IU]/mL (ref 0.450–4.500)

## 2023-03-26 LAB — HEMOGLOBIN A1C
Est. average glucose Bld gHb Est-mCnc: 108 mg/dL
Hgb A1c MFr Bld: 5.4 % (ref 4.8–5.6)

## 2023-03-29 ENCOUNTER — Emergency Department (HOSPITAL_COMMUNITY)
Admission: EM | Admit: 2023-03-29 | Discharge: 2023-03-29 | Disposition: A | Discharge status: Home / Self Care | Payer: Self-pay | Attending: Emergency Medicine | Admitting: Emergency Medicine

## 2023-03-29 ENCOUNTER — Other Ambulatory Visit: Payer: Self-pay

## 2023-03-29 ENCOUNTER — Emergency Department (HOSPITAL_COMMUNITY): Payer: Self-pay

## 2023-03-29 DIAGNOSIS — I10 Essential (primary) hypertension: Secondary | ICD-10-CM | POA: Diagnosis not present

## 2023-03-29 DIAGNOSIS — Z1152 Encounter for screening for COVID-19: Secondary | ICD-10-CM | POA: Insufficient documentation

## 2023-03-29 DIAGNOSIS — G8929 Other chronic pain: Secondary | ICD-10-CM | POA: Insufficient documentation

## 2023-03-29 DIAGNOSIS — R10819 Abdominal tenderness, unspecified site: Secondary | ICD-10-CM | POA: Insufficient documentation

## 2023-03-29 DIAGNOSIS — R102 Pelvic and perineal pain: Secondary | ICD-10-CM | POA: Diagnosis present

## 2023-03-29 LAB — RESP PANEL BY RT-PCR (RSV, FLU A&B, COVID)  RVPGX2
Influenza A by PCR: NEGATIVE
Influenza B by PCR: NEGATIVE
Resp Syncytial Virus by PCR: NEGATIVE
SARS Coronavirus 2 by RT PCR: NEGATIVE

## 2023-03-29 LAB — URINALYSIS, ROUTINE W REFLEX MICROSCOPIC
Bacteria, UA: NONE SEEN
Bilirubin Urine: NEGATIVE
Glucose, UA: NEGATIVE mg/dL
Hgb urine dipstick: NEGATIVE
Ketones, ur: NEGATIVE mg/dL
Nitrite: NEGATIVE
Protein, ur: NEGATIVE mg/dL
Specific Gravity, Urine: 1.02 (ref 1.005–1.030)
pH: 5 (ref 5.0–8.0)

## 2023-03-29 LAB — CBC WITH DIFFERENTIAL/PLATELET
Abs Immature Granulocytes: 0.15 10*3/uL — ABNORMAL HIGH (ref 0.00–0.07)
Basophils Absolute: 0.1 10*3/uL (ref 0.0–0.1)
Basophils Relative: 1 %
Eosinophils Absolute: 0.4 10*3/uL (ref 0.0–0.5)
Eosinophils Relative: 3 %
HCT: 43.3 % (ref 36.0–46.0)
Hemoglobin: 14.1 g/dL (ref 12.0–15.0)
Immature Granulocytes: 1 %
Lymphocytes Relative: 23 %
Lymphs Abs: 2.5 10*3/uL (ref 0.7–4.0)
MCH: 31.1 pg (ref 26.0–34.0)
MCHC: 32.6 g/dL (ref 30.0–36.0)
MCV: 95.6 fL (ref 80.0–100.0)
Monocytes Absolute: 1.2 10*3/uL — ABNORMAL HIGH (ref 0.1–1.0)
Monocytes Relative: 11 %
Neutro Abs: 6.6 10*3/uL (ref 1.7–7.7)
Neutrophils Relative %: 61 %
Platelets: 450 10*3/uL — ABNORMAL HIGH (ref 150–400)
RBC: 4.53 MIL/uL (ref 3.87–5.11)
RDW: 12.9 % (ref 11.5–15.5)
WBC: 10.9 10*3/uL — ABNORMAL HIGH (ref 4.0–10.5)
nRBC: 0 % (ref 0.0–0.2)

## 2023-03-29 LAB — PREGNANCY, URINE: Preg Test, Ur: NEGATIVE

## 2023-03-29 LAB — COMPREHENSIVE METABOLIC PANEL
ALT: 24 U/L (ref 0–44)
AST: 23 U/L (ref 15–41)
Albumin: 3.9 g/dL (ref 3.5–5.0)
Alkaline Phosphatase: 62 U/L (ref 38–126)
Anion gap: 10 (ref 5–15)
BUN: 37 mg/dL — ABNORMAL HIGH (ref 6–20)
CO2: 20 mmol/L — ABNORMAL LOW (ref 22–32)
Calcium: 9.6 mg/dL (ref 8.9–10.3)
Chloride: 107 mmol/L (ref 98–111)
Creatinine, Ser: 0.9 mg/dL (ref 0.44–1.00)
GFR, Estimated: >60 mL/min (ref >60)
Glucose, Bld: 141 mg/dL — ABNORMAL HIGH (ref 70–99)
Potassium: 3.9 mmol/L (ref 3.5–5.1)
Sodium: 137 mmol/L (ref 135–145)
Total Bilirubin: 0.4 mg/dL (ref 0.0–1.2)
Total Protein: 7.4 g/dL (ref 6.5–8.1)

## 2023-03-29 LAB — HCG, QUANTITATIVE, PREGNANCY: hCG, Beta Chain, Quant, S: <1 m[IU]/mL (ref <5)

## 2023-03-29 LAB — LIPASE, BLOOD: Lipase: 37 U/L (ref 11–51)

## 2023-03-29 LAB — HIV ANTIBODY (ROUTINE TESTING W REFLEX): HIV Screen 4th Generation wRfx: NONREACTIVE

## 2023-03-29 MED ORDER — LORAZEPAM 2 MG/ML IJ SOLN: 1.0000 mg | Freq: Once | INTRAMUSCULAR | Status: DC

## 2023-03-29 MED ORDER — DICYCLOMINE HCL 20 MG PO TABS: 20.0000 mg | ORAL_TABLET | Freq: Two times a day (BID) | ORAL | 0 refills | Status: DC

## 2023-03-29 MED ORDER — MORPHINE SULFATE (PF) 4 MG/ML IV SOLN
4.0000 mg | Freq: Once | INTRAVENOUS | Status: AC
  Administered 2023-03-29: 4 mg via INTRAVENOUS
  Filled 2023-03-29: qty 1

## 2023-03-29 MED ORDER — ONDANSETRON HCL 4 MG/2ML IJ SOLN
4.0000 mg | Freq: Once | INTRAMUSCULAR | Status: AC
  Administered 2023-03-29: 4 mg via INTRAVENOUS
  Filled 2023-03-29: qty 2

## 2023-03-29 MED ORDER — LORAZEPAM 2 MG/ML IJ SOLN
0.5000 mg | Freq: Once | INTRAMUSCULAR | Status: AC
  Administered 2023-03-29: 1 mg via INTRAVENOUS
  Filled 2023-03-29: qty 1

## 2023-03-29 MED ORDER — SODIUM CHLORIDE 0.9 % IV BOLUS
1000.0000 mL | Freq: Once | INTRAVENOUS | Status: AC
Start: 2023-03-29 — End: 2023-03-29
  Administered 2023-03-29: 1000 mL via INTRAVENOUS

## 2023-03-29 MED ORDER — KETOROLAC TROMETHAMINE 15 MG/ML IJ SOLN
15.0000 mg | Freq: Once | INTRAMUSCULAR | Status: AC
  Administered 2023-03-29: 15 mg via INTRAVENOUS
  Filled 2023-03-29: qty 1

## 2023-03-29 NOTE — ED Provider Notes (Signed)
[place] EMERGENCY DEPARTMENT AT [facility] Provider Note   CSN: [id number] Arrival date & time: 03/29/23  6213     History {Add pertinent medical, surgical, social history, OB history to HPI:1} Chief Complaint  Patient presents with   Abdominal Pain   Emesis    Krystal [name] is a 33 y.o. female.   Abdominal Pain Associated symptoms: vomiting   Emesis Associated symptoms: abdominal pain      Patient has a history of interstitial cystitis anxiety, migraines, kidney stones, help syndrome, hypertension, cholecystitis, necrotizing fasciitis, PTSD, depression, hemorrhagic ovarian cyst.  Patient had prior appendectomy as well as cholecystectomy.  Patient was admitted to the hospital on January 16 of this year.  She was discharged on the 25th.  Patient was admitted to the hospital for abdominal pelvic pain.  Patient had a CT scan her abdomen pelvis as well as ultrasounds of the pelvis that did not show any acute abnormality.  Patient was treated for abdominal pain nausea and vomiting.  She was seen by GI psychiatry and recommendation was for outpatient follow-up with pain management.  Patient's symptoms were felt likely to be related to chronic interstitial cystitis with occasional exacerbation with chronic nausea vomiting.  Patient states since leaving the hospital she has continued to have pain nausea and vomiting.  She did follow-up with her primary care doctor.  Patient has referrals to pain management as well as her urologist and GI.  Patient states she was prescribed Linzess but could not afford it.  Home Medications Prior to Admission medications   Medication Sig Start Date End Date Taking? Authorizing Provider  acetaminophen (TYLENOL) 500 MG tablet Take 1,000 mg by mouth daily as needed for moderate pain, headache or fever.    [provider]  albuterol (VENTOLIN HFA) 108 (90 Base) MCG/ACT inhaler Inhale 2 puffs into the lungs every 4 (four) hours as needed  for wheezing or shortness of breath. 10/15/22   Ellamae Sia, DO  EPINEPHrine 0.3 mg/0.3 mL IJ SOAJ injection Inject 0.3 mg into the muscle as needed for anaphylaxis. 01/15/21   Ellamae Sia, DO  FLUoxetine (PROZAC) 40 MG capsule Take 1 capsule (40 mg total) by mouth daily. IM program 06/19/22   Quincy Simmonds, MD  hydrocortisone (CORTEF) 10 MG tablet Take 1 tablet (10 mg total) by mouth 2 (two) times daily. IM program 06/19/22   Quincy Simmonds, MD  irbesartan-hydrochlorothiazide (AVALIDE) 150-12.5 MG tablet Take 2 tablets by mouth in the morning and at bedtime. 10/06/22 10/06/23  Morene Crocker, MD  levothyroxine (SYNTHROID) 150 MCG tablet Take 1 tablet (150 mcg total) by mouth daily at 6 (six) AM. 10/06/22 10/06/23  Morene Crocker, MD  linaclotide [name]) 145 MCG CAPS capsule Take 1 capsule (145 mcg total) by mouth daily before breakfast. 03/22/23 04/21/23  Lanae Boast, MD  OLANZapine (ZYPREXA) 2.5 MG tablet Take 1 tablet (2.5 mg total) by mouth 3 (three) times daily as needed for up to 15 doses (nausea, intractible anxiety). 03/21/23   Lanae Boast, MD  OLANZapine zydis (ZYPREXA) 15 MG disintegrating tablet Take 0.5 tablets (7.5 mg total) by mouth at bedtime. 03/21/23 04/20/23  Lanae Boast, MD  ondansetron (ZOFRAN-ODT) 4 MG disintegrating tablet Take 1 tablet (4 mg total) by mouth every 6 (six) hours as needed for nausea or vomiting. 03/21/23   Lanae Boast, MD  oxyCODONE (ROXICODONE) 5 MG immediate release tablet Take 1 tablet (5 mg total) by mouth every 6 (six) hours as needed for up to  10 doses for severe pain (pain score 7-10). 03/21/23   Lanae Boast, MD  pantoprazole (PROTONIX) 40 MG tablet Take 1 tablet (40 mg total) by mouth 2 (two) times daily. 03/21/23 04/20/23  Lanae Boast, MD  promethazine (PHENERGAN) 25 MG suppository Place 1 suppository (25 mg total) rectally every 6 (six) hours as needed for nausea or vomiting. 03/21/23   Lanae Boast, MD  SYMBICORT 80-4.5 MCG/ACT inhaler Inhale 2 puffs into the  lungs 2 (two) times daily as needed (shortness of breath).    [provider]      Allergies    Bee venom, Coreg [carvedilol], Red dye #40 (allura red), Compazine [prochlorperazine maleate], Oxycodone, Vicodin [hydrocodone-acetaminophen], Cymbalta [duloxetine hcl], Ditropan [oxybutynin], Elavil [amitriptyline], Motrin [ibuprofen], Naprosyn [naproxen], Pyrimethamine, Sulfadoxine, Zestril [lisinopril], Nifedical xl [nifedipine], Reglan [metoclopramide], and Trandate [labetalol]    Review of Systems   Review of Systems  Gastrointestinal:  Positive for abdominal pain and vomiting.    Physical Exam Updated Vital Signs BP (!) 150/107   Pulse 88   Temp 98.2 F (36.8 C)   Resp 16   LMP 03/10/2023   SpO2 99%  Physical Exam Vitals and nursing note reviewed.  Constitutional:      Appearance: She is well-developed. She is not diaphoretic.  HENT:     Head: Normocephalic and atraumatic.     Right Ear: External ear normal.     Left Ear: External ear normal.  Eyes:     General: No scleral icterus.       Right eye: No discharge.        Left eye: No discharge.     Conjunctiva/sclera: Conjunctivae normal.  Neck:     Trachea: No tracheal deviation.  Cardiovascular:     Rate and Rhythm: Normal rate and regular rhythm.  Pulmonary:     Effort: Pulmonary effort is normal. No respiratory distress.     Breath sounds: Normal breath sounds. No stridor. No wheezing or rales.  Abdominal:     General: Bowel sounds are normal. There is no distension.     Palpations: Abdomen is soft.     Tenderness: There is generalized abdominal tenderness. There is no guarding or rebound.  Musculoskeletal:        General: No tenderness or deformity.     Cervical back: Neck supple.  Skin:    General: Skin is warm and dry.     Findings: No rash.  Neurological:     General: No focal deficit present.     Mental Status: She is alert.     Cranial Nerves: No cranial nerve deficit, dysarthria or facial  asymmetry.     Sensory: No sensory deficit.     Motor: No abnormal muscle tone or seizure activity.     Coordination: Coordination normal.  Psychiatric:        Mood and Affect: Mood normal.     ED Results / Procedures / Treatments   Labs (all labs ordered are listed, but only abnormal results are displayed) Labs Reviewed  CBC WITH DIFFERENTIAL/PLATELET - Abnormal; Notable for the following components:      Result Value   WBC 10.9 (*)    Platelets 450 (*)    Monocytes Absolute 1.2 (*)    Abs Immature Granulocytes 0.15 (*)    All other components within normal limits  COMPREHENSIVE METABOLIC PANEL - Abnormal; Notable for the following components:   CO2 20 (*)    Glucose, Bld 141 (*)    BUN 37 (*)  All other components within normal limits  URINALYSIS, ROUTINE W REFLEX MICROSCOPIC - Abnormal; Notable for the following components:   Leukocytes,Ua TRACE (*)    All other components within normal limits  RESP PANEL BY RT-PCR (RSV, FLU A&B, COVID)  RVPGX2  LIPASE, BLOOD  PREGNANCY, URINE  HCG, QUANTITATIVE, PREGNANCY  RPR  HIV ANTIBODY (ROUTINE TESTING W REFLEX)  GC/CHLAMYDIA PROBE AMP ([place]) NOT AT [facility]    EKG None  Radiology US Pelvis Complete Result Date: 03/29/2023 CLINICAL DATA:  33 year old female presenting with suprapubic pain and vaginal discharge. EXAM: TRANSABDOMINAL AND TRANSVAGINAL ULTRASOUND OF PELVIS DOPPLER ULTRASOUND OF OVARIES TECHNIQUE: Both transabdominal and transvaginal ultrasound examinations of the pelvis were performed. Transabdominal technique was performed for global imaging of the pelvis including uterus, ovaries, adnexal regions, and pelvic cul-de-sac. It was necessary to proceed with endovaginal exam following the transabdominal exam to visualize the ovaries. Color and duplex Doppler ultrasound was utilized to evaluate blood flow to the ovaries. COMPARISON:  Pelvic ultrasound 03/12/2023. FINDINGS: Uterus Measurements: 8.6 x 4.1 x 5.7 cm =  volume: 105.8 mL. No fibroids or other mass visualized. Endometrium Thickness: 7.0 mm.  No focal abnormality visualized. Right ovary Measurements: 3.3 x 2.4 x 2.4 cm = volume: 10.0 mL. Normal appearance/no adnexal mass. Left ovary Measurements: 2.6 x 1.6 x 1.3 cm = volume: 2.7 mL. Normal appearance/no adnexal mass. Pulsed Doppler evaluation of both ovaries demonstrates normal low-resistance arterial and venous waveforms. Other findings No abnormal free fluid. IMPRESSION: 1. Normal pelvic ultrasound. Electronically Signed   By: Trudie Reed M.D.   On: 03/29/2023 08:29   US Transvaginal Non-OB Result Date: 03/29/2023 CLINICAL DATA:  33 year old female presenting with suprapubic pain and vaginal discharge. EXAM: TRANSABDOMINAL AND TRANSVAGINAL ULTRASOUND OF PELVIS DOPPLER ULTRASOUND OF OVARIES TECHNIQUE: Both transabdominal and transvaginal ultrasound examinations of the pelvis were performed. Transabdominal technique was performed for global imaging of the pelvis including uterus, ovaries, adnexal regions, and pelvic cul-de-sac. It was necessary to proceed with endovaginal exam following the transabdominal exam to visualize the ovaries. Color and duplex Doppler ultrasound was utilized to evaluate blood flow to the ovaries. COMPARISON:  Pelvic ultrasound 03/12/2023. FINDINGS: Uterus Measurements: 8.6 x 4.1 x 5.7 cm = volume: 105.8 mL. No fibroids or other mass visualized. Endometrium Thickness: 7.0 mm.  No focal abnormality visualized. Right ovary Measurements: 3.3 x 2.4 x 2.4 cm = volume: 10.0 mL. Normal appearance/no adnexal mass. Left ovary Measurements: 2.6 x 1.6 x 1.3 cm = volume: 2.7 mL. Normal appearance/no adnexal mass. Pulsed Doppler evaluation of both ovaries demonstrates normal low-resistance arterial and venous waveforms. Other findings No abnormal free fluid. IMPRESSION: 1. Normal pelvic ultrasound. Electronically Signed   By: Trudie Reed M.D.   On: 03/29/2023 08:29   [place] Art/Ven Flow Abd Pelv  Doppler Result Date: 03/29/2023 CLINICAL DATA:  33 year old female presenting with suprapubic pain and vaginal discharge. EXAM: TRANSABDOMINAL AND TRANSVAGINAL ULTRASOUND OF PELVIS DOPPLER ULTRASOUND OF OVARIES TECHNIQUE: Both transabdominal and transvaginal ultrasound examinations of the pelvis were performed. Transabdominal technique was performed for global imaging of the pelvis including uterus, ovaries, adnexal regions, and pelvic cul-de-sac. It was necessary to proceed with endovaginal exam following the transabdominal exam to visualize the ovaries. Color and duplex Doppler ultrasound was utilized to evaluate blood flow to the ovaries. COMPARISON:  Pelvic ultrasound 03/12/2023. FINDINGS: Uterus Measurements: 8.6 x 4.1 x 5.7 cm = volume: 105.8 mL. No fibroids or other mass visualized. Endometrium Thickness: 7.0 mm.  No focal abnormality visualized. Right ovary  Measurements: 3.3 x 2.4 x 2.4 cm = volume: 10.0 mL. Normal appearance/no adnexal mass. Left ovary Measurements: 2.6 x 1.6 x 1.3 cm = volume: 2.7 mL. Normal appearance/no adnexal mass. Pulsed Doppler evaluation of both ovaries demonstrates normal low-resistance arterial and venous waveforms. Other findings No abnormal free fluid. IMPRESSION: 1. Normal pelvic ultrasound. Electronically Signed   By: Trudie Reed M.D.   On: 03/29/2023 08:29    Procedures Procedures  {Document cardiac monitor, telemetry assessment procedure when appropriate:1}  Medications Ordered in ED Medications  morphine (PF) 4 MG/ML injection 4 mg (has no administration in time range)  ondansetron (ZOFRAN) injection 4 mg (has no administration in time range)  ketorolac (TORADOL) 15 MG/ML injection 15 mg (has no administration in time range)  sodium chloride 0.9 % bolus 1,000 mL (has no administration in time range)  LORazepam (ATIVAN) injection 0.5 mg (1 mg Intravenous Given 03/29/23 0700)    ED Course/ Medical Decision Making/ A&P Clinical Course as of 03/29/23 0859   Sun Mar 29, 2023  0843 Pelvic ultrasound is normal [JK]  0844 Urinalysis normal.  COVID flu RSV negative.  CBC normal.  Metabolic panel does show elevated BUN.  Pregnancy test negative. [JK]  [id number] Outpatient records reviewed.  CT scan abdomen pelvis on the 17th normal [JK]    Clinical Course User Index [JK] Linwood Dibbles, MD   {   Click here for ABCD2, HEART and other calculatorsREFRESH Note before signing :1}                              Medical Decision Making Risk Prescription drug management.   Patient presented to the ED for evaluation of persistent abdominal pain.  Patient has history of chronic abdominal pain possibly related to interstitial cystitis.  Patient's ED workup today does not show any significant electrolyte abnormalities.  Although patient does have an increased BUN.  Urinalysis not suggestive of infection.  No evidence of hepatitis or pancreatitis.  Patient had a pelvic ultrasound that did not show any evidence of torsion or other acute abnormality.  Discussed with patient the findings today.  There is no signs of acute infection.  I have low suspicion for obstruction.  Patient has had extensive workup recently for this abdominal pain.  Will give her a dose of IV fluids and antiemetics and medications for pain.  No indications for hospitalization at this time.  Recommend outpatient follow-up with her specialists ye  {Document critical care time when appropriate:1} {Document review of labs and clinical decision tools ie heart score, Chads2Vasc2 etc:1}  {Document your independent review of radiology images, and any outside records:1} {Document your discussion with family members, caretakers, and with consultants:1} {Document social determinants of health affecting pt's care:1} {Document your decision making why or why not admission, treatments were needed:1} Final Clinical Impression(s) / ED Diagnoses Final diagnoses:  Chronic abdominal pain    Rx / DC Orders ED  Discharge Orders     None

## 2023-03-29 NOTE — Discharge Instructions (Addendum)
The test today in the ED did not show any signs of acute infection.  Your pelvic ultrasound did not show any signs of cyst or trouble with the blood flow to the ovaries.  Follow-up with the urologist ,GI doctor ,and pain specialist doctor as planned.

## 2023-03-29 NOTE — ED Notes (Signed)
Pt has urine in lab

## 2023-03-29 NOTE — ED Provider Triage Note (Signed)
Emergency Medicine Provider Triage Evaluation Note  Krystal [name] , a 33 y.o. female  was evaluated in triage.  Pt complains of suprapubic pain along with nausea vomiting since being discharged on 03/12/2023.  Patient does have chronic abdominal pain suprapubic region and denies fevers but states she does have brown vaginal discharge and had a hurts when she urinates.  Patient states she is sexually active and is not concerned for STDs.  Patient states she is not able to keep any of her medications down.  Patient denies chest pain or shortness of breath.  Review of Systems  Positive:  Negative:   Physical Exam  BP (!) 150/107   Pulse 88   Temp 98.2 F (36.8 C)   Resp 16   LMP 03/10/2023   SpO2 99%  Gen:   Awake, in distress   Resp:  Normal effort  MSK:   Moves extremities without difficulty  Other:  Suprapubic tenderness without peritoneal signs, no CVA tenderness noted  Medical Decision Making  Medically screening exam initiated at 6:53 AM.  Appropriate orders placed.  Krystal [name] was informed that the remainder of the evaluation will be completed by another provider, this initial triage assessment does not replace that evaluation, and the importance of remaining in the ED until their evaluation is complete.  Workup initiated, patient does have history of chronic suprapubic abdominal pain due to her chronic cystitis and so do suspect this may be the cause she had a recent admission with no new features, will obtain labs and imaging, patient is agreeable to STD testing including syphilis and HIV, patient given Ativan for her nausea and vomiting, patient stable at this time.   Netta Corrigan, [place] 03/29/23 [phone]

## 2023-03-29 NOTE — ED Triage Notes (Signed)
Pt having abdominal pain since last being discharged. Unable to keep prescriped medications down at home

## 2023-03-29 NOTE — ED Notes (Signed)
Lab draw unsuccessful 

## 2023-03-29 NOTE — ED Notes (Signed)
Scanner is broken   Diplomatic Services operational officer notified

## 2023-03-29 NOTE — ED Notes (Signed)
[place] at bedside; provider made aware of ativan dosage (given 1mg  prior to change in medication)

## 2023-03-30 ENCOUNTER — Encounter (INDEPENDENT_AMBULATORY_CARE_PROVIDER_SITE_OTHER): Payer: Self-pay | Admitting: Primary Care

## 2023-03-30 LAB — GC/CHLAMYDIA PROBE AMP (~~LOC~~) NOT AT ARMC
Chlamydia: NEGATIVE
Comment: NEGATIVE
Comment: NORMAL
Neisseria Gonorrhea: NEGATIVE

## 2023-03-30 LAB — RPR: RPR Ser Ql: NONREACTIVE

## 2023-04-06 ENCOUNTER — Other Ambulatory Visit (HOSPITAL_COMMUNITY): Payer: Self-pay

## 2023-04-06 MED ORDER — HEPARIN SODIUM (PORCINE) 10000 UNIT/ML IJ SOLN
40000.0000 [IU] | Freq: Every day | INTRAMUSCULAR | 99 refills | Status: AC
  Filled 2023-04-06: qty 28, 7d supply, fill #0

## 2023-04-06 MED ORDER — SYRINGE (DISPOSABLE) 20 ML MISC
99 refills | Status: AC
  Filled 2023-04-06: qty 30, 30d supply, fill #0

## 2023-04-06 MED ORDER — BUPIVACAINE HCL (PF) 0.5 % IJ SOLN
15.0000 mL | Freq: Every day | INTRAMUSCULAR | 99 refills | Status: AC
  Filled 2023-04-06: qty 210, 7d supply, fill #0
  Filled 2023-05-15: qty 210, 7d supply, fill #1

## 2023-04-06 MED ORDER — BD DISP NEEDLES 18G X 1-1/2" MISC
99 refills | Status: DC
  Filled 2023-04-06: qty 30, 30d supply, fill #0

## 2023-04-07 ENCOUNTER — Other Ambulatory Visit (HOSPITAL_COMMUNITY): Payer: Self-pay

## 2023-04-08 ENCOUNTER — Emergency Department (HOSPITAL_COMMUNITY)
Admission: EM | Admit: 2023-04-08 | Discharge: 2023-04-08 | Disposition: A | Discharge status: Home / Self Care | Payer: Self-pay | Attending: Emergency Medicine | Admitting: Emergency Medicine

## 2023-04-08 ENCOUNTER — Other Ambulatory Visit: Payer: Self-pay

## 2023-04-08 DIAGNOSIS — R1084 Generalized abdominal pain: Secondary | ICD-10-CM | POA: Diagnosis not present

## 2023-04-08 DIAGNOSIS — R112 Nausea with vomiting, unspecified: Secondary | ICD-10-CM | POA: Insufficient documentation

## 2023-04-08 DIAGNOSIS — I10 Essential (primary) hypertension: Secondary | ICD-10-CM | POA: Insufficient documentation

## 2023-04-08 DIAGNOSIS — J45909 Unspecified asthma, uncomplicated: Secondary | ICD-10-CM | POA: Insufficient documentation

## 2023-04-08 DIAGNOSIS — Z87442 Personal history of urinary calculi: Secondary | ICD-10-CM | POA: Diagnosis not present

## 2023-04-08 DIAGNOSIS — Z79899 Other long term (current) drug therapy: Secondary | ICD-10-CM | POA: Diagnosis not present

## 2023-04-08 LAB — CBC
HCT: 42 % (ref 36.0–46.0)
Hemoglobin: 13.7 g/dL (ref 12.0–15.0)
MCH: 30.9 pg (ref 26.0–34.0)
MCHC: 32.6 g/dL (ref 30.0–36.0)
MCV: 94.6 fL (ref 80.0–100.0)
Platelets: 383 10*3/uL (ref 150–400)
RBC: 4.44 MIL/uL (ref 3.87–5.11)
RDW: 12.8 % (ref 11.5–15.5)
WBC: 5.6 10*3/uL (ref 4.0–10.5)
nRBC: 0 % (ref 0.0–0.2)

## 2023-04-08 LAB — URINALYSIS, ROUTINE W REFLEX MICROSCOPIC
Bacteria, UA: NONE SEEN
Bilirubin Urine: NEGATIVE
Glucose, UA: NEGATIVE mg/dL
Ketones, ur: NEGATIVE mg/dL
Leukocytes,Ua: NEGATIVE
Nitrite: NEGATIVE
Protein, ur: NEGATIVE mg/dL
Specific Gravity, Urine: 1.008 (ref 1.005–1.030)
pH: 6 (ref 5.0–8.0)

## 2023-04-08 LAB — COMPREHENSIVE METABOLIC PANEL
ALT: 28 U/L (ref 0–44)
AST: 26 U/L (ref 15–41)
Albumin: 4.1 g/dL (ref 3.5–5.0)
Alkaline Phosphatase: 47 U/L (ref 38–126)
Anion gap: 10 (ref 5–15)
BUN: 14 mg/dL (ref 6–20)
CO2: 25 mmol/L (ref 22–32)
Calcium: 9.5 mg/dL (ref 8.9–10.3)
Chloride: 104 mmol/L (ref 98–111)
Creatinine, Ser: 0.84 mg/dL (ref 0.44–1.00)
GFR, Estimated: >60 mL/min (ref >60)
Glucose, Bld: 82 mg/dL (ref 70–99)
Potassium: 3.6 mmol/L (ref 3.5–5.1)
Sodium: 139 mmol/L (ref 135–145)
Total Bilirubin: 0.8 mg/dL (ref 0.0–1.2)
Total Protein: 6.9 g/dL (ref 6.5–8.1)

## 2023-04-08 LAB — LIPASE, BLOOD: Lipase: 26 U/L (ref 11–51)

## 2023-04-08 LAB — HCG, QUANTITATIVE, PREGNANCY: hCG, Beta Chain, Quant, S: <1 m[IU]/mL (ref <5)

## 2023-04-08 MED ORDER — LINACLOTIDE 145 MCG PO CAPS: 145.0000 ug | ORAL_CAPSULE | Freq: Every day | ORAL | 0 refills | Status: DC

## 2023-04-08 MED ORDER — ONDANSETRON HCL 4 MG/2ML IJ SOLN
4.0000 mg | Freq: Once | INTRAMUSCULAR | Status: AC
  Administered 2023-04-08: 4 mg via INTRAVENOUS
  Filled 2023-04-08: qty 2

## 2023-04-08 MED ORDER — SODIUM CHLORIDE 0.9 % IV BOLUS
1000.0000 mL | Freq: Once | INTRAVENOUS | Status: AC
  Administered 2023-04-08: 1000 mL via INTRAVENOUS

## 2023-04-08 MED ORDER — HYDROMORPHONE HCL 1 MG/ML IJ SOLN
1.0000 mg | Freq: Once | INTRAMUSCULAR | Status: AC
  Administered 2023-04-08: 1 mg via INTRAVENOUS
  Filled 2023-04-08: qty 1

## 2023-04-08 MED ORDER — SODIUM CHLORIDE 0.9 % IV SOLN
25.0000 mg | Freq: Once | INTRAVENOUS | Status: AC
  Administered 2023-04-08: 25 mg via INTRAVENOUS
  Filled 2023-04-08: qty 25

## 2023-04-08 MED ORDER — LACTATED RINGERS IV BOLUS
1000.0000 mL | Freq: Once | INTRAVENOUS | Status: AC
  Administered 2023-04-08: 1000 mL via INTRAVENOUS

## 2023-04-08 MED ORDER — OXYCODONE HCL 5 MG PO TABS: 5.0000 mg | ORAL_TABLET | ORAL | 0 refills | Status: DC | PRN

## 2023-04-08 MED ORDER — LORAZEPAM 2 MG/ML IJ SOLN
1.0000 mg | Freq: Once | INTRAMUSCULAR | Status: AC
  Administered 2023-04-08: 1 mg via INTRAVENOUS
  Filled 2023-04-08: qty 1

## 2023-04-08 MED ORDER — FENTANYL CITRATE PF 50 MCG/ML IJ SOSY
50.0000 ug | PREFILLED_SYRINGE | Freq: Once | INTRAMUSCULAR | Status: AC
  Administered 2023-04-08: 50 ug via INTRAVENOUS
  Filled 2023-04-08: qty 1

## 2023-04-08 MED ORDER — ONDANSETRON HCL 4 MG/2ML IJ SOLN: 4.0000 mg | Freq: Once | INTRAMUSCULAR | Status: DC

## 2023-04-08 NOTE — Telephone Encounter (Unsigned)
Copied from CRM [id number]. Topic: Clinical - Medication Question >> Apr 08, 2023 10:27 AM Krystal [name] wrote: Reason for CRM: pt saw Marcelino Duster for a hospital follow up on 03/25/2023.  Pt would like to know if Marcelino Duster will write her a paper script for the  linaclotide (LINZESS) 145 MCG CAPS capsule. Pt states it is so expensive to get filled, she would like to take it and find a less expensive pharmacy.  This med was prescribed to her in the hospital, and not sure why she did not address to [place] when she saw her.

## 2023-04-08 NOTE — ED Provider Notes (Signed)
[place] EMERGENCY DEPARTMENT AT [facility] Provider Note   CSN: [id number] Arrival date & time: 04/08/23  1114     History  No chief complaint on file.   Krystal [name] is a 33 y.o. female.  HPI Patient with history of irritable bowel disease and interstitial cystitis.  Has nausea and vomiting.  Also abdominal pain.  States recently started on tramadol.  States she is unable to keep her medicines down.  States she feels as if her vomiting was improved the pain would be better to.  Has had recent admission to hospital for similar symptoms.  Saw Dr. Logan Bores from urology yesterday with plan for cystoscopy with hydrodistention that we will be in about a month and a half.   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 Authorizing Provider   acetaminophen (TYLENOL) 500 MG tablet Take 1,000 mg by mouth daily as needed for moderate pain, headache or fever.    [provider]  albuterol (VENTOLIN HFA) 108 (90 Base) MCG/ACT inhaler Inhale 2 puffs into the lungs every 4 (four) hours as needed for wheezing or shortness of breath. 10/15/22   Ellamae Sia, DO  bupivacaine,PF, (MARCAINE) 0.5 % SOLN injection Instill 15 mLs into bladder daily with heparin. 04/06/23     dicyclomine (BENTYL) 20 MG tablet Take 1 tablet (20 mg total) by mouth 2 (two) times daily. 03/29/23   Linwood Dibbles, MD  EPINEPHrine 0.3 mg/0.3 mL IJ SOAJ injection Inject 0.3 mg into the muscle as needed for anaphylaxis. 01/15/21   Ellamae Sia, DO  FLUoxetine (PROZAC) 40 MG capsule Take 1 capsule (40 mg total) by mouth daily. IM program 06/19/22   Quincy Simmonds, MD  heparin 69629 UNIT/ML injection Instill 4 mLs (40,000 Units total) into the bladder daily with bupivacaine. 04/06/23     hydrocortisone (CORTEF) 10 MG tablet Take 1 tablet (10 mg total) by mouth 2 (two) times daily. IM program 06/19/22   Quincy Simmonds, MD  irbesartan-hydrochlorothiazide (AVALIDE) 150-12.5 MG tablet Take 2 tablets by mouth in the morning and at bedtime. 10/06/22 10/06/23  Morene Crocker, MD  levothyroxine (SYNTHROID) 150 MCG tablet Take 1 tablet (150 mcg total) by mouth daily at 6 (six) AM. 10/06/22 10/06/23  Morene Crocker, MD  linaclotide [name]) 145 MCG CAPS  capsule Take 1 capsule (145 mcg total) by mouth daily before breakfast. 03/22/23 04/21/23  Lanae Boast, MD  NEEDLE, DISP, 18 G (BD DISP NEEDLES) 18G X 1-1/2" MISC Use as directed with daily bladder instillations 04/06/23     OLANZapine (ZYPREXA) 2.5 MG tablet Take 1 tablet (2.5 mg total) by mouth 3 (three) times daily as needed for up to 15 doses (nausea, intractible anxiety). 03/21/23   Lanae Boast, MD  OLANZapine zydis (ZYPREXA) 15 MG disintegrating tablet Take 0.5 tablets (7.5 mg total) by mouth at bedtime. 03/21/23 04/20/23  Lanae Boast,  MD  ondansetron (ZOFRAN-ODT) 4 MG disintegrating tablet Take 1 tablet (4 mg total) by mouth every 6 (six) hours as needed for nausea or vomiting. 03/21/23   Lanae Boast, MD  oxyCODONE (ROXICODONE) 5 MG immediate release tablet Take 1 tablet (5 mg total) by mouth every 6 (six) hours as needed for up to 10 doses for severe pain (pain score 7-10). 03/21/23   Lanae Boast, MD  pantoprazole (PROTONIX) 40 MG tablet Take 1 tablet (40 mg total) by mouth 2 (two) times daily. 03/21/23 04/20/23  Lanae Boast, MD  promethazine (PHENERGAN) 25 MG suppository Place 1 suppository (25 mg total) rectally every 6 (six) hours as needed for nausea or vomiting. 03/21/23   Lanae Boast, MD  SYMBICORT 80-4.5 MCG/ACT inhaler Inhale 2 puffs into the lungs 2 (two) times daily as needed (shortness of breath).    [provider]  Syringe, Disposable, 20 ML MISC Use as directed for daily bladder instillations 04/06/23         Allergies    Bee venom, Coreg [carvedilol], Red dye #40 (allura red), Compazine [prochlorperazine maleate], Oxycodone, Vicodin [hydrocodone-acetaminophen], Cymbalta [duloxetine hcl], Ditropan [oxybutynin], Elavil [amitriptyline], Motrin [ibuprofen], Naprosyn [naproxen], Pyrimethamine, Sulfadoxine, Zestril [lisinopril], Nifedical xl [nifedipine], Reglan [metoclopramide], and Trandate [labetalol]    Review of Systems   Review of Systems  Physical Exam Updated Vital Signs BP (!) 160/106 (BP Location: Right Arm)   Pulse 84   Temp 98.7 F (37.1 C) (Oral)   Resp 20   Ht 5\' 11"  (1.803 m)   Wt 101 kg   LMP 04/05/2023   SpO2 100%   BMI 31.06 kg/m  Physical Exam Vitals and nursing note reviewed.  Eyes:     Pupils: Pupils are equal, round, and reactive to light.  Cardiovascular:     Rate and Rhythm: Regular rhythm.  Abdominal:     General: There is no distension.     Comments: Patient is actively vomiting.  Neurological:     Mental Status: She is alert.     ED Results / Procedures / Treatments    Labs (all labs ordered are listed, but only abnormal results are displayed) Labs Reviewed  URINALYSIS, ROUTINE W REFLEX MICROSCOPIC - Abnormal; Notable for the following components:      Result Value   Hgb urine dipstick LARGE (*)    All other components within normal limits  CBC  HCG, QUANTITATIVE, PREGNANCY  COMPREHENSIVE METABOLIC PANEL  LIPASE, BLOOD    EKG None  Radiology No results found.  Procedures Procedures    Medications Ordered in ED Medications  promethazine (PHENERGAN) 25 mg in sodium chloride 0.9 % 50 mL IVPB (0 mg Intravenous Stopped 04/08/23 1633)  lactated ringers bolus 1,000 mL (1,000 mLs Intravenous New Bag/Given 04/08/23 1607)  fentaNYL (SUBLIMAZE) injection 50 mcg (50 mcg Intravenous Given 04/08/23 1653)  ondansetron (ZOFRAN) injection 4 mg (4 mg Intravenous Given 04/08/23 1654)  LORazepam (ATIVAN) injection  1 mg (1 mg Intravenous Given 04/08/23 1658)    ED Course/ Medical Decision Making/ A&P                                 Medical Decision Making Risk Prescription drug management.   Patient with acute on chronic pelvic and abdominal pain.  Nausea and vomiting.  Some mild diarrhea.  Differential diagnose long but includes causes such as her chronic pelvic pain, interstitial cystitis, obstruction infection.  Will get basic blood work.  Will give fluid.  Will also give some Phenergan.  States she been able to keep down at home. Reviewed recent discharge note.  Blood work reassuring, although CMP and lipase still pending.  Fluid bolus given.  Given some Phenergan.  Reported still pain give a small dose of fentanyl.  Care turned over to Dr. Silverio Lay         Final Clinical Impression(s) / ED Diagnoses Final diagnoses:  None    Rx / DC Orders ED Discharge Orders     None         Benjiman Core, MD 04/08/23 1700

## 2023-04-08 NOTE — ED Provider Notes (Signed)
  Physical Exam  BP (!) 160/106 (BP Location: Right Arm)   Pulse 84   Temp 98.7 F (37.1 C) (Oral)   Resp 20   Ht 5\' 11"  (1.803 m)   Wt 101 kg   LMP 04/05/2023   SpO2 100%   BMI 31.06 kg/m   Physical Exam  Procedures  Procedures  ED Course / MDM    Medical Decision Making Care assumed at 4 PM.  Patient is here with abdominal pain and vomiting.  Signout pending labs and reassessment.  6:40 PM I reassessed patient and patient is feeling better now.  Patient was able to tolerate oral fluids.  Patient states that she has some Phenergan at home.  Pain is improved with pain medicine.  Patient has been prescribed some tramadol but requesting stronger pain meds.  I told her that I can give her several pills of oxycodone.  Told her to follow-up with her GI doctor.  Patient requests refill of her Linzess.   Problems Addressed: Generalized abdominal pain: chronic illness or injury Nausea and vomiting, unspecified vomiting type: acute illness or injury  Amount and/or Complexity of Data Reviewed Labs: ordered. Decision-making details documented in ED Course.  Risk Prescription drug management.          Charlynne Pander, MD 04/08/23 [phone]

## 2023-04-08 NOTE — Discharge Instructions (Addendum)
Please stay hydrated.  Take your Phenergan as prescribed by your doctor  I have prescribed some oxycodone for pain  I have refilled your Linzess  You need to follow-up with your GI doctor and primary care doctor  Return to ER if you have severe abdominal pain or vomiting or fever

## 2023-04-08 NOTE — ED Provider Triage Note (Signed)
Emergency Medicine Provider Triage Evaluation Note  Krystal [name] , a 33 y.o. female  was evaluated in triage.  Pt complains of pain in her bladder area that started 2 days ago.  Also reports some dysuria.  Denies any back pain, fevers or chills.  Also reports nausea and difficulty keeping foods down.  Reports normal bowel movements.  Reports she has a history of chronic pelvic pain and this episode seems similar  Review of Systems  Positive: As above Negative: As above  Physical Exam  BP (!) 162/113 (BP Location: Left Arm)   Pulse 78   Temp 98.4 F (36.9 C) (Oral)   Resp 16   Ht 5\' 11"  (1.803 m)   Wt 101 kg   LMP 04/05/2023   SpO2 99%   BMI 31.06 kg/m  Gen:   Awake, no distress   Resp:  Normal effort  MSK:   Moves extremities without difficulty   Medical Decision Making  Medically screening exam initiated at 12:16 PM.  Appropriate orders placed.  Tyriana C Tarbet was informed that the remainder of the evaluation will be completed by another provider, this initial triage assessment does not replace that evaluation, and the importance of remaining in the ED until their evaluation is complete.     Arabella Merles, PA-C 04/08/23 1218

## 2023-04-13 ENCOUNTER — Ambulatory Visit: Payer: Self-pay | Admitting: General Practice

## 2023-04-13 NOTE — Telephone Encounter (Signed)
 Noted

## 2023-04-13 NOTE — Telephone Encounter (Signed)
Copied from CRM [id number]. Topic: Clinical - Red Word Triage >> Apr 13, 2023 12:54 PM Suzette B wrote: [facility] that prompted transfer to Nurse Triage: Extreme bladder pain, can barely walk, and frequent urination and painful when urinating.   Chief Complaint: Flank pain Symptoms: Left flank pain, burning with urination  Frequency: Constant  Pertinent Negatives: Patient denies Fever Disposition: [] ED /[x] Urgent Care (no appt availability in office) / [] Appointment(In office/virtual)/ []  [facility]/ [] Home Care/ [] Refused Recommended Disposition /[] [place] [facility]/ []  Follow-up with PCP Additional Notes: Patient reports that overnight she began to experience left sided flank pain and pain with urination. Patient denies any other symptom at this time. Patient advised of appointment availability and states she will go to urgent care today for evaluation.     Reason for Disposition  Pain or burning with passing urine (urination)  Answer Assessment - Initial Assessment Questions 1. LOCATION: "Where does it hurt?" (e.g., left, right)     Left flank 2. ONSET: "When did the pain start?"     Overnight  3. SEVERITY: "How bad is the pain?" (e.g., Scale 1-10; mild, moderate, or severe)   - MILD (1-3): doesn't interfere with normal activities    - MODERATE (4-7): interferes with normal activities or awakens from sleep    - SEVERE (8-10): excruciating pain and patient unable to do normal activities (stays in bed)       7/10 4. PATTERN: "Does the pain come and go, or is it constant?"      Constant  5. CAUSE: "What do you think is causing the pain?"     Unsure  6. OTHER SYMPTOMS:  "Do you have any other symptoms?" (e.g., fever, abdomen pain, vomiting, leg weakness, burning with urination, blood in urine)     Painful urination  7. PREGNANCY:  "Is there any chance you are pregnant?" "When was your last menstrual period?"     No  Protocols used: Flank Pain-A-AH

## 2023-04-14 ENCOUNTER — Emergency Department (HOSPITAL_COMMUNITY)
Admission: EM | Admit: 2023-04-14 | Discharge: 2023-04-14 | Disposition: A | Discharge status: Home / Self Care | Payer: Self-pay | Attending: Emergency Medicine | Admitting: Emergency Medicine

## 2023-04-14 ENCOUNTER — Encounter (HOSPITAL_COMMUNITY): Payer: Self-pay | Admitting: *Deleted

## 2023-04-14 ENCOUNTER — Other Ambulatory Visit: Payer: Self-pay

## 2023-04-14 DIAGNOSIS — R1030 Lower abdominal pain, unspecified: Secondary | ICD-10-CM | POA: Diagnosis present

## 2023-04-14 DIAGNOSIS — N3010 Interstitial cystitis (chronic) without hematuria: Secondary | ICD-10-CM | POA: Insufficient documentation

## 2023-04-14 LAB — CBC WITH DIFFERENTIAL/PLATELET
Abs Immature Granulocytes: 0.05 10*3/uL (ref 0.00–0.07)
Basophils Absolute: 0.1 10*3/uL (ref 0.0–0.1)
Basophils Relative: 1 %
Eosinophils Absolute: 0.4 10*3/uL (ref 0.0–0.5)
Eosinophils Relative: 6 %
HCT: 42.9 % (ref 36.0–46.0)
Hemoglobin: 14.1 g/dL (ref 12.0–15.0)
Immature Granulocytes: 1 %
Lymphocytes Relative: 34 %
Lymphs Abs: 2.2 10*3/uL (ref 0.7–4.0)
MCH: 31.3 pg (ref 26.0–34.0)
MCHC: 32.9 g/dL (ref 30.0–36.0)
MCV: 95.1 fL (ref 80.0–100.0)
Monocytes Absolute: 0.7 10*3/uL (ref 0.1–1.0)
Monocytes Relative: 11 %
Neutro Abs: 3 10*3/uL (ref 1.7–7.7)
Neutrophils Relative %: 47 %
Platelets: 349 10*3/uL (ref 150–400)
RBC: 4.51 MIL/uL (ref 3.87–5.11)
RDW: 12.7 % (ref 11.5–15.5)
WBC: 6.3 10*3/uL (ref 4.0–10.5)
nRBC: 0 % (ref 0.0–0.2)

## 2023-04-14 LAB — COMPREHENSIVE METABOLIC PANEL
ALT: 29 U/L (ref 0–44)
AST: 25 U/L (ref 15–41)
Albumin: 4.1 g/dL (ref 3.5–5.0)
Alkaline Phosphatase: 56 U/L (ref 38–126)
Anion gap: 9 (ref 5–15)
BUN: 22 mg/dL — ABNORMAL HIGH (ref 6–20)
CO2: 29 mmol/L (ref 22–32)
Calcium: 9.5 mg/dL (ref 8.9–10.3)
Chloride: 99 mmol/L (ref 98–111)
Creatinine, Ser: 0.9 mg/dL (ref 0.44–1.00)
GFR, Estimated: >60 mL/min (ref >60)
Glucose, Bld: 87 mg/dL (ref 70–99)
Potassium: 3.6 mmol/L (ref 3.5–5.1)
Sodium: 137 mmol/L (ref 135–145)
Total Bilirubin: 0.6 mg/dL (ref 0.0–1.2)
Total Protein: 7.3 g/dL (ref 6.5–8.1)

## 2023-04-14 LAB — URINALYSIS, ROUTINE W REFLEX MICROSCOPIC
Bilirubin Urine: NEGATIVE
Glucose, UA: NEGATIVE mg/dL
Ketones, ur: NEGATIVE mg/dL
Leukocytes,Ua: NEGATIVE
Nitrite: NEGATIVE
Protein, ur: NEGATIVE mg/dL
Specific Gravity, Urine: 1.016 (ref 1.005–1.030)
pH: 7 (ref 5.0–8.0)

## 2023-04-14 LAB — LIPASE, BLOOD: Lipase: 36 U/L (ref 11–51)

## 2023-04-14 LAB — HCG, SERUM, QUALITATIVE: Preg, Serum: NEGATIVE

## 2023-04-14 MED ORDER — CLONAZEPAM 0.5 MG PO TABS
1.0000 mg | ORAL_TABLET | Freq: Once | ORAL | Status: AC
  Administered 2023-04-14: 1 mg via ORAL
  Filled 2023-04-14: qty 2

## 2023-04-14 MED ORDER — KETOROLAC TROMETHAMINE 15 MG/ML IJ SOLN
15.0000 mg | Freq: Once | INTRAMUSCULAR | Status: AC
  Administered 2023-04-14: 15 mg via INTRAVENOUS
  Filled 2023-04-14: qty 1

## 2023-04-14 MED ORDER — ONDANSETRON HCL 4 MG/2ML IJ SOLN
4.0000 mg | Freq: Once | INTRAMUSCULAR | Status: AC
  Administered 2023-04-14: 4 mg via INTRAVENOUS
  Filled 2023-04-14: qty 2

## 2023-04-14 MED ORDER — LORAZEPAM 2 MG/ML IJ SOLN
1.0000 mg | Freq: Once | INTRAMUSCULAR | Status: AC
  Administered 2023-04-14: 1 mg via INTRAVENOUS
  Filled 2023-04-14: qty 1

## 2023-04-14 MED ORDER — ONDANSETRON 8 MG PO TBDP: 8.0000 mg | ORAL_TABLET | Freq: Three times a day (TID) | ORAL | 0 refills | Status: DC | PRN

## 2023-04-14 MED ORDER — ALPRAZOLAM 0.5 MG PO TABS: 1.0000 mg | ORAL_TABLET | Freq: Once | ORAL | Status: DC

## 2023-04-14 MED ORDER — CLONAZEPAM 1 MG PO TABS: 1.0000 mg | ORAL_TABLET | Freq: Three times a day (TID) | ORAL | 0 refills | Status: DC | PRN

## 2023-04-14 MED ORDER — MORPHINE SULFATE (PF) 4 MG/ML IV SOLN
8.0000 mg | Freq: Once | INTRAVENOUS | Status: AC
  Administered 2023-04-14: 8 mg via INTRAVENOUS
  Filled 2023-04-14: qty 2

## 2023-04-14 MED ORDER — OXYCODONE HCL 5 MG PO TABS: 5.0000 mg | ORAL_TABLET | ORAL | 0 refills | Status: DC | PRN

## 2023-04-14 MED ORDER — FENTANYL CITRATE PF 50 MCG/ML IJ SOSY
100.0000 ug | PREFILLED_SYRINGE | Freq: Once | INTRAMUSCULAR | Status: AC
  Administered 2023-04-14: 100 ug via INTRAVENOUS
  Filled 2023-04-14: qty 2

## 2023-04-14 NOTE — Discharge Instructions (Addendum)
You were seen in the emergency room for abdominal pain.  It appears that you are having interstitial cystitis flareup.  Please contact your IC specialist for additional management.  We have prescribed you clonazepam, medicine prescribed to you in the past for anxiety.  Return to the emergency room if you start having fevers, bloody urine, severe nausea and vomiting.

## 2023-04-14 NOTE — ED Triage Notes (Signed)
Here by POV from home for L flank pain radiating to bladder. Endorses NV, frequency, urgency, decreased U.O. Denies hematuria, diarrhea, fever, syncope. H/o kidney stones. GU is Dr. Marcelyn Bruins at [facility]. PCP was unable to see today, instructed to come to ED. No relief with tramadol, phenergan or zofran. Rates pain 7/10. Sx onset yesterday. Worse today.

## 2023-04-14 NOTE — ED Provider Notes (Signed)
[place] EMERGENCY DEPARTMENT AT [facility] Provider Note   CSN: [id number] Arrival date & time: 04/14/23  1032     History  Chief Complaint  Patient presents with   Flank Pain    Krystal [name] is a 33 y.o. female.  HPI    33 year old female comes in with chief complaint of flank pain.  Patient has history of interstitial cystitis.  She states that she is followed by Dr. Logan Bores at [facility].  2 days ago she found out that they might have to move.  Yesterday she started having increased intensity of back pain along with urinary discomfort.  She has discomfort with urination, suprapubic abdominal pain and also left flank pain.  Pain is similar to her previous interstitial cystitis, but she also has history of kidney stones.    Home Medications Prior to Admission medications   Medication Sig Start Date End Date Taking? Authorizing Provider  clonazePAM (KLONOPIN) 1 MG tablet Take 1 tablet (1 mg total) by mouth 3 (three) times daily as needed for anxiety. 04/14/23  Yes Dorthy Hustead, MD  ondansetron (ZOFRAN-ODT) 8 MG disintegrating tablet Take 1 tablet (8 mg total) by mouth every 8 (eight) hours as needed for nausea. 04/14/23  Yes Derwood Kaplan, MD  acetaminophen (TYLENOL) 500 MG tablet Take 1,000 mg by mouth daily as needed for moderate pain, headache or fever.    [provider]  albuterol (VENTOLIN HFA) 108 (90 Base) MCG/ACT inhaler Inhale 2 puffs into the lungs every 4 (four) hours as needed for wheezing or shortness of breath. 10/15/22   Ellamae Sia, DO  bupivacaine,PF, (MARCAINE) 0.5 % SOLN injection Instill 15 mLs into bladder daily with heparin. 04/06/23     dicyclomine (BENTYL) 20 MG tablet Take 1 tablet (20 mg total) by mouth 2 (two) times daily. 03/29/23   Linwood Dibbles, MD  EPINEPHrine 0.3 mg/0.3 mL IJ SOAJ injection Inject 0.3 mg into the muscle as needed for anaphylaxis. 01/15/21   Ellamae Sia, DO  FLUoxetine (PROZAC) 40 MG capsule  Take 1 capsule (40 mg total) by mouth daily. IM program 06/19/22   Quincy Simmonds, MD  heparin 56213 UNIT/ML injection Instill 4 mLs (40,000 Units total) into the bladder daily with bupivacaine. 04/06/23     hydrocortisone (CORTEF) 10 MG tablet Take 1 tablet (10 mg total) by mouth 2 (two) times daily. IM program 06/19/22   Quincy Simmonds, MD  irbesartan-hydrochlorothiazide (AVALIDE) 150-12.5 MG tablet Take 2 tablets by mouth in the morning and at bedtime. 10/06/22 10/06/23  Morene Crocker, MD  levothyroxine (SYNTHROID) 150 MCG tablet Take 1 tablet (150 mcg total) by mouth daily at 6 (six) AM. 10/06/22 10/06/23  Morene Crocker, MD  linaclotide [facility]) 145 MCG CAPS capsule Take 1 capsule (145 mcg total) by mouth daily before breakfast. 04/08/23 05/08/23  Charlynne Pander, MD  NEEDLE, DISP, 18 G (BD DISP NEEDLES) 18G X 1-1/2" MISC Use as directed with daily bladder instillations 04/06/23     OLANZapine (ZYPREXA) 2.5 MG tablet Take 1 tablet (2.5 mg total) by mouth 3 (three) times daily as needed for up to 15 doses (nausea, intractible anxiety). 03/21/23   Lanae Boast, MD  OLANZapine zydis (ZYPREXA) 15 MG disintegrating tablet Take 0.5 tablets (7.5 mg total) by mouth at bedtime. 03/21/23 04/20/23  Lanae Boast, MD  oxyCODONE (ROXICODONE) 5 MG immediate release tablet Take 1 tablet (5 mg total) by mouth every 4 (four) hours as needed for severe pain (pain score  7-10). 04/08/23   Charlynne Pander, MD  pantoprazole (PROTONIX) 40 MG tablet Take 1 tablet (40 mg total) by mouth 2 (two) times daily. 03/21/23 04/20/23  Lanae Boast, MD  promethazine (PHENERGAN) 25 MG suppository Place 1 suppository (25 mg total) rectally every 6 (six) hours as needed for nausea or vomiting. 03/21/23   Lanae Boast, MD  SYMBICORT 80-4.5 MCG/ACT inhaler Inhale 2 puffs into the lungs 2 (two) times daily as needed (shortness of breath).    [provider]  Syringe, Disposable, 20 ML MISC Use as directed for daily bladder  instillations 04/06/23         Allergies    Bee venom, Coreg [carvedilol], Red dye #40 (allura red), Compazine [prochlorperazine maleate], Oxycodone, Vicodin [hydrocodone-acetaminophen], Cymbalta [duloxetine hcl], Ditropan [oxybutynin], Elavil [amitriptyline], Motrin [ibuprofen], Naprosyn [naproxen], Pyrimethamine, Sulfadoxine, Zestril [lisinopril], Nifedical xl [nifedipine], Reglan [metoclopramide], and Trandate [labetalol]    Review of Systems   Review of Systems  All other systems reviewed and are negative.   Physical Exam Updated Vital Signs BP (!) 155/136   Pulse 77   Temp 98 F (36.7 C) (Oral)   Resp 16   Wt 102.1 kg   LMP 04/05/2023   SpO2 100%   BMI 31.38 kg/m  Physical Exam Vitals and nursing note reviewed.  Constitutional:      General: She is in acute distress.     Appearance: She is well-developed. She is not toxic-appearing or diaphoretic.  HENT:     Head: Atraumatic.  Cardiovascular:     Rate and Rhythm: Normal rate.  Pulmonary:     Effort: Pulmonary effort is normal.  Abdominal:     Palpations: Abdomen is soft.     Tenderness: There is abdominal tenderness. There is no guarding or rebound.  Musculoskeletal:     Cervical back: Normal range of motion and neck supple.  Skin:    General: Skin is warm and dry.  Neurological:     Mental Status: She is alert and oriented to person, place, and time.     ED Results / Procedures / Treatments   Labs (all labs ordered are listed, but only abnormal results are displayed) Labs Reviewed  COMPREHENSIVE METABOLIC PANEL - Abnormal; Notable for the following components:      Result Value   BUN 22 (*)    All other components within normal limits  URINALYSIS, ROUTINE W REFLEX MICROSCOPIC - Abnormal; Notable for the following components:   APPearance HAZY (*)    Hgb urine dipstick SMALL (*)    Bacteria, UA RARE (*)    All other components within normal limits  HCG, SERUM, QUALITATIVE  CBC WITH DIFFERENTIAL/PLATELET   LIPASE, BLOOD    EKG None  Radiology No results found.  Procedures Procedures    Medications Ordered in ED Medications  clonazePAM (KLONOPIN) tablet 1 mg (has no administration in time range)  LORazepam (ATIVAN) injection 1 mg (1 mg Intravenous Given 04/14/23 1250)  fentaNYL (SUBLIMAZE) injection 100 mcg (100 mcg Intravenous Given 04/14/23 1251)  ondansetron (ZOFRAN) injection 4 mg (4 mg Intravenous Given 04/14/23 1250)  morphine (PF) 4 MG/ML injection 8 mg (8 mg Intravenous Given 04/14/23 1419)  ketorolac (TORADOL) 15 MG/ML injection 15 mg (15 mg Intravenous Given 04/14/23 1419)    ED Course/ Medical Decision Making/ A&P                                 Medical  Decision Making Amount and/or Complexity of Data Reviewed Labs: ordered.  Risk Prescription drug management.   This patient presents to the ED with chief complaint(s) of abdominal pain, flank pain with pertinent past medical history of interstitial cystitis.The complaint involves an extensive differential diagnosis and also carries with it a high risk of complications and morbidity.    The differential diagnosis includes : Intra cystitis flareup, acute cystitis, pyelonephritis, renal stones.  The initial plan is to get basic labs and focus on symptom management.  Clinically it appears that patient most likely is having interstitial cystitis flareup, and it is likely stress-induced.   Additional history obtained:  Records reviewed Care Everywhere/External Records  Independent labs interpretation:  The following labs were independently interpreted: UA is reassuring.  CBC, metabolic profile is normal.  No hematuria noted.  No pyuria noted.  Pregnancy test negative.  Treatment and Reassessment: Patient given IV fentanyl followed by second dose of IV morphine.  We have also given her IV Toradol. Results of the ED workup discussed with the patient.  Informed her that most likely her symptoms are because of IC  flareup.  She has received Ativan already.  Patient informs me that she was on Xanax at some point last year.  She also indicates that her IC doctor was contemplating putting her back on anxiety medication if her symptoms are not better controlled.  I have reviewed [place] controlled substance database.  Based on that I will give her 3 days worth of as needed clonazepam. Advise follow-up with the ICU doctor.  Final Clinical Impression(s) / ED Diagnoses Final diagnoses:  Chronic interstitial cystitis without hematuria    Rx / DC Orders ED Discharge Orders          Ordered    clonazePAM (KLONOPIN) 1 MG tablet  3 times daily PRN        04/14/23 1430    ondansetron (ZOFRAN-ODT) 8 MG disintegrating tablet  Every 8 hours PRN        04/14/23 1434              Derwood Kaplan, MD 04/14/23 1437

## 2023-04-17 ENCOUNTER — Other Ambulatory Visit (HOSPITAL_COMMUNITY): Payer: Self-pay

## 2023-04-20 ENCOUNTER — Other Ambulatory Visit (HOSPITAL_COMMUNITY): Payer: Self-pay

## 2023-04-21 ENCOUNTER — Inpatient Hospital Stay (HOSPITAL_COMMUNITY)
Admission: EM | Admit: 2023-04-21 | Discharge: 2023-04-27 | DRG: 690 | Disposition: A | Discharge status: Home / Self Care | Payer: Self-pay | Attending: Internal Medicine | Admitting: Internal Medicine

## 2023-04-21 ENCOUNTER — Encounter (HOSPITAL_COMMUNITY): Payer: Self-pay | Admitting: Emergency Medicine

## 2023-04-21 ENCOUNTER — Other Ambulatory Visit: Payer: Self-pay

## 2023-04-21 DIAGNOSIS — Z9152 Personal history of nonsuicidal self-harm: Secondary | ICD-10-CM

## 2023-04-21 DIAGNOSIS — Z886 Allergy status to analgesic agent status: Secondary | ICD-10-CM

## 2023-04-21 DIAGNOSIS — Z5971 Insufficient health insurance coverage: Secondary | ICD-10-CM

## 2023-04-21 DIAGNOSIS — K589 Irritable bowel syndrome without diarrhea: Secondary | ICD-10-CM | POA: Diagnosis present

## 2023-04-21 DIAGNOSIS — E2740 Unspecified adrenocortical insufficiency: Secondary | ICD-10-CM | POA: Diagnosis present

## 2023-04-21 DIAGNOSIS — N3010 Interstitial cystitis (chronic) without hematuria: Principal | ICD-10-CM | POA: Diagnosis present

## 2023-04-21 DIAGNOSIS — F4310 Post-traumatic stress disorder, unspecified: Secondary | ICD-10-CM | POA: Diagnosis present

## 2023-04-21 DIAGNOSIS — F329 Major depressive disorder, single episode, unspecified: Secondary | ICD-10-CM | POA: Diagnosis present

## 2023-04-21 DIAGNOSIS — N3289 Other specified disorders of bladder: Principal | ICD-10-CM | POA: Diagnosis present

## 2023-04-21 DIAGNOSIS — Z79890 Hormone replacement therapy (postmenopausal): Secondary | ICD-10-CM

## 2023-04-21 DIAGNOSIS — Z823 Family history of stroke: Secondary | ICD-10-CM

## 2023-04-21 DIAGNOSIS — Z888 Allergy status to other drugs, medicaments and biological substances status: Secondary | ICD-10-CM

## 2023-04-21 DIAGNOSIS — F419 Anxiety disorder, unspecified: Secondary | ICD-10-CM | POA: Diagnosis present

## 2023-04-21 DIAGNOSIS — Z87442 Personal history of urinary calculi: Secondary | ICD-10-CM

## 2023-04-21 DIAGNOSIS — Z8249 Family history of ischemic heart disease and other diseases of the circulatory system: Secondary | ICD-10-CM

## 2023-04-21 DIAGNOSIS — R1110 Vomiting, unspecified: Secondary | ICD-10-CM

## 2023-04-21 DIAGNOSIS — G43909 Migraine, unspecified, not intractable, without status migrainosus: Secondary | ICD-10-CM | POA: Diagnosis present

## 2023-04-21 DIAGNOSIS — Z7951 Long term (current) use of inhaled steroids: Secondary | ICD-10-CM

## 2023-04-21 DIAGNOSIS — I10 Essential (primary) hypertension: Secondary | ICD-10-CM | POA: Diagnosis present

## 2023-04-21 DIAGNOSIS — Z9151 Personal history of suicidal behavior: Secondary | ICD-10-CM

## 2023-04-21 DIAGNOSIS — F603 Borderline personality disorder: Secondary | ICD-10-CM | POA: Diagnosis present

## 2023-04-21 DIAGNOSIS — E669 Obesity, unspecified: Secondary | ICD-10-CM | POA: Diagnosis present

## 2023-04-21 DIAGNOSIS — J45909 Unspecified asthma, uncomplicated: Secondary | ICD-10-CM | POA: Diagnosis present

## 2023-04-21 DIAGNOSIS — Z91030 Bee allergy status: Secondary | ICD-10-CM

## 2023-04-21 DIAGNOSIS — Z6832 Body mass index (BMI) 32.0-32.9, adult: Secondary | ICD-10-CM

## 2023-04-21 DIAGNOSIS — Z79899 Other long term (current) drug therapy: Secondary | ICD-10-CM

## 2023-04-21 DIAGNOSIS — Z885 Allergy status to narcotic agent status: Secondary | ICD-10-CM

## 2023-04-21 DIAGNOSIS — R112 Nausea with vomiting, unspecified: Principal | ICD-10-CM | POA: Diagnosis present

## 2023-04-21 DIAGNOSIS — F332 Major depressive disorder, recurrent severe without psychotic features: Secondary | ICD-10-CM

## 2023-04-21 LAB — COMPREHENSIVE METABOLIC PANEL
ALT: 48 U/L — ABNORMAL HIGH (ref 0–44)
AST: 40 U/L (ref 15–41)
Albumin: 4.3 g/dL (ref 3.5–5.0)
Alkaline Phosphatase: 60 U/L (ref 38–126)
Anion gap: 11 (ref 5–15)
BUN: 13 mg/dL (ref 6–20)
CO2: 26 mmol/L (ref 22–32)
Calcium: 9.7 mg/dL (ref 8.9–10.3)
Chloride: 99 mmol/L (ref 98–111)
Creatinine, Ser: 0.84 mg/dL (ref 0.44–1.00)
GFR, Estimated: >60 mL/min (ref >60)
Glucose, Bld: 89 mg/dL (ref 70–99)
Potassium: 3.8 mmol/L (ref 3.5–5.1)
Sodium: 136 mmol/L (ref 135–145)
Total Bilirubin: 0.6 mg/dL (ref 0.0–1.2)
Total Protein: 7.6 g/dL (ref 6.5–8.1)

## 2023-04-21 LAB — URINALYSIS, ROUTINE W REFLEX MICROSCOPIC
Bilirubin Urine: NEGATIVE
Glucose, UA: NEGATIVE mg/dL
Hgb urine dipstick: NEGATIVE
Ketones, ur: NEGATIVE mg/dL
Leukocytes,Ua: NEGATIVE
Nitrite: NEGATIVE
Protein, ur: NEGATIVE mg/dL
Specific Gravity, Urine: 1.012 (ref 1.005–1.030)
pH: 7 (ref 5.0–8.0)

## 2023-04-21 LAB — CBC
HCT: 45.4 % (ref 36.0–46.0)
Hemoglobin: 15 g/dL (ref 12.0–15.0)
MCH: 31.4 pg (ref 26.0–34.0)
MCHC: 33 g/dL (ref 30.0–36.0)
MCV: 95 fL (ref 80.0–100.0)
Platelets: 326 10*3/uL (ref 150–400)
RBC: 4.78 MIL/uL (ref 3.87–5.11)
RDW: 13.4 % (ref 11.5–15.5)
WBC: 8.3 10*3/uL (ref 4.0–10.5)
nRBC: 0 % (ref 0.0–0.2)

## 2023-04-21 LAB — HCG, SERUM, QUALITATIVE: Preg, Serum: NEGATIVE

## 2023-04-21 LAB — LIPASE, BLOOD: Lipase: 26 U/L (ref 11–51)

## 2023-04-21 MED ORDER — MORPHINE SULFATE (PF) 2 MG/ML IV SOLN
2.0000 mg | INTRAVENOUS | Status: DC | PRN
  Administered 2023-04-22 – 2023-04-23 (×10): 2 mg via INTRAVENOUS
  Filled 2023-04-21 (×10): qty 1

## 2023-04-21 MED ORDER — LACTATED RINGERS IV SOLN: INTRAVENOUS | Status: DC

## 2023-04-21 MED ORDER — ENOXAPARIN SODIUM 40 MG/0.4ML IJ SOSY
40.0000 mg | PREFILLED_SYRINGE | INTRAMUSCULAR | Status: DC
  Administered 2023-04-21 – 2023-04-26 (×6): 40 mg via SUBCUTANEOUS
  Filled 2023-04-21 (×6): qty 0.4

## 2023-04-21 MED ORDER — LORAZEPAM 1 MG PO TABS
1.0000 mg | ORAL_TABLET | Freq: Once | ORAL | Status: AC
  Administered 2023-04-21: 1 mg via ORAL
  Filled 2023-04-21: qty 1

## 2023-04-21 MED ORDER — MORPHINE SULFATE (PF) 4 MG/ML IV SOLN
4.0000 mg | Freq: Once | INTRAVENOUS | Status: AC
  Administered 2023-04-21: 4 mg via INTRAVENOUS
  Filled 2023-04-21: qty 1

## 2023-04-21 MED ORDER — LEVOTHYROXINE SODIUM 150 MCG PO TABS
150.0000 ug | ORAL_TABLET | Freq: Every day | ORAL | Status: DC
  Administered 2023-04-22 – 2023-04-27 (×6): 150 ug via ORAL
  Filled 2023-04-21: qty 1
  Filled 2023-04-21: qty 3
  Filled 2023-04-21: qty 2
  Filled 2023-04-21 (×2): qty 1
  Filled 2023-04-21: qty 3
  Filled 2023-04-21: qty 1
  Filled 2023-04-21 (×2): qty 3
  Filled 2023-04-21 (×2): qty 1

## 2023-04-21 MED ORDER — ONDANSETRON HCL 4 MG/2ML IJ SOLN
4.0000 mg | Freq: Once | INTRAMUSCULAR | Status: AC
  Administered 2023-04-21: 4 mg via INTRAVENOUS
  Filled 2023-04-21: qty 2

## 2023-04-21 MED ORDER — ONDANSETRON 4 MG PO TBDP
4.0000 mg | ORAL_TABLET | Freq: Once | ORAL | Status: AC | PRN
  Administered 2023-04-21: 4 mg via ORAL
  Filled 2023-04-21: qty 1

## 2023-04-21 MED ORDER — ONDANSETRON HCL 4 MG/2ML IJ SOLN
4.0000 mg | Freq: Four times a day (QID) | INTRAMUSCULAR | Status: DC | PRN
  Administered 2023-04-22 (×2): 4 mg via INTRAVENOUS
  Filled 2023-04-21 (×3): qty 2

## 2023-04-21 MED ORDER — FENTANYL CITRATE PF 50 MCG/ML IJ SOSY
50.0000 ug | PREFILLED_SYRINGE | Freq: Once | INTRAMUSCULAR | Status: AC
  Administered 2023-04-21: 50 ug via INTRAVENOUS
  Filled 2023-04-21: qty 1

## 2023-04-21 MED ORDER — DROPERIDOL 2.5 MG/ML IJ SOLN
1.2500 mg | Freq: Once | INTRAMUSCULAR | Status: AC
  Administered 2023-04-21: 1.25 mg via INTRAVENOUS
  Filled 2023-04-21: qty 2

## 2023-04-21 MED ORDER — LINACLOTIDE 145 MCG PO CAPS
145.0000 ug | ORAL_CAPSULE | Freq: Every day | ORAL | Status: DC
  Administered 2023-04-22 – 2023-04-24 (×3): 145 ug via ORAL
  Filled 2023-04-21 (×3): qty 1

## 2023-04-21 MED ORDER — OXYCODONE HCL 5 MG PO TABS
5.0000 mg | ORAL_TABLET | Freq: Four times a day (QID) | ORAL | Status: DC | PRN
  Administered 2023-04-21 – 2023-04-23 (×7): 5 mg via ORAL
  Filled 2023-04-21 (×7): qty 1

## 2023-04-21 MED ORDER — MELATONIN 5 MG PO TABS
5.0000 mg | ORAL_TABLET | Freq: Every evening | ORAL | Status: DC | PRN
  Administered 2023-04-25: 5 mg via ORAL
  Filled 2023-04-21: qty 1

## 2023-04-21 MED ORDER — POLYETHYLENE GLYCOL 3350 17 G PO PACK: 17.0000 g | PACK | Freq: Every day | ORAL | Status: DC | PRN

## 2023-04-21 MED ORDER — LACTATED RINGERS IV BOLUS
1000.0000 mL | Freq: Once | INTRAVENOUS | Status: AC
  Administered 2023-04-21: 1000 mL via INTRAVENOUS

## 2023-04-21 MED ORDER — DIPHENHYDRAMINE HCL 50 MG/ML IJ SOLN
25.0000 mg | Freq: Once | INTRAMUSCULAR | Status: AC
  Administered 2023-04-21: 25 mg via INTRAVENOUS
  Filled 2023-04-21: qty 1

## 2023-04-21 NOTE — ED Notes (Signed)
 Pt did not tolerate PO challenge. Reports nausea with emesis episode. Pain level remains 7/10. DO notified.

## 2023-04-21 NOTE — ED Triage Notes (Addendum)
 Patient presents with bladder pain, nausea and vomiting. She is unable to keep her medication. Bladder pain has been ongoing for some time, but the nausea and vomiting began last night. Patient has also experienced frequency and diarrhea.   HX IBS

## 2023-04-21 NOTE — H&P (Incomplete)
 History and Physical  Krystal [name] ZOX:[id number] DOB: Nov 21, 1990 DOA: 04/21/2023  Referring physician: Dr. Theresia Lo, EDP  PCP: Patient, No Pcp Per  Outpatient Specialists: Urology. Patient coming from: Home. Chief Complaint: Bladder pain.  HPI: Krystal [name] is a 32 y.o. female with medical history significant for chronic interstitial cystitis with plan for cystoscopy on 05/22/2023, chronic anxiety/depression, IBS, hypertension, who presents to the ER with complaints of bladder spasm x 2 days.  Associated with nausea and vomiting which occurred today and prompted her to come to the ER for further evaluation.  States the pain is similar to prior interstitial cystitis flare which for her usually is induced by stress.  States she is currently having a stressful time in her life.  In the ER, afebrile with no leukocytosis.  UA is negative for pyuria.  Lab work essentially unremarkable.  The patient received multiple rounds of IV opiate based analgesics with persistent lower abdominal pain.  EDP requested admission for further management and control of her pain.  Admitted by [facility], hospitalist service.  ED Course: 98.7.  BP 132/76, pulse 90, respiratory 14, O2 saturation 96% on room air.  Lab studies notable for mildly elevated ALT.  Review of Systems: Review of systems as noted in the HPI. All other systems reviewed and are negative.   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   Severe uncontrolled hypertension 10/08/2017   UTI (urinary tract infection)    Past Surgical History:  Procedure Laterality Date   APPENDECTOMY     BIOPSY  05/10/2020   Procedure: BIOPSY;  Surgeon: Willis Modena, MD;  Location: [facility] ENDOSCOPY;  Service: Endoscopy;;   BIOPSY  05/02/2022   Procedure: BIOPSY;  Surgeon: Benancio Deeds, MD;  Location: [facility] ENDOSCOPY;  Service: Gastroenterology;;   CESAREAN SECTION N/A 12/06/2017   Procedure: CESAREAN SECTION;  Surgeon: Conan Bowens, MD;  Location: [facility] BIRTHING SUITES;  Service: Obstetrics;  Laterality: N/A;   CESAREAN SECTION  07/21/2021   Procedure: CESAREAN SECTION;  Surgeon: Reva Bores, MD;  Location: [facility] LD ORS;  Service: Obstetrics;;   CHOLECYSTECTOMY N/A 05/11/2020   Procedure: SINGLE SITE LAPAROSCOPIC CHOLECYSTECTOMY AND LIVER BIOSY;  Surgeon: Karie Soda, MD;  Location: [facility] ORS;  Service: General;  Laterality: N/A;   ESOPHAGOGASTRODUODENOSCOPY (EGD) WITH PROPOFOL N/A 05/10/2020   Procedure: ESOPHAGOGASTRODUODENOSCOPY (EGD) WITH PROPOFOL;  Surgeon: Willis Modena, MD;  Location: [facility] ENDOSCOPY;  Service: Endoscopy;  Laterality: N/A;   FLEXIBLE SIGMOIDOSCOPY N/A 05/02/2022  Procedure: FLEXIBLE SIGMOIDOSCOPY;  Surgeon: Benancio Deeds, MD;  Location: [facility] ENDOSCOPY;  Service: Gastroenterology;  Laterality: N/A;   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 She has never used smokeless tobacco. She reports current alcohol use. She reports that she does not use drugs.   Allergies  Allergen Reactions   Bee Venom Anaphylaxis and Swelling    Swelling of face, throat. Airway restriction.   Coreg [Carvedilol] Shortness Of Breath and Other (See Comments)    Triggers asthma   Red Dye #40 (Allura Red) Other (See Comments)    Migraines   Compazine [Prochlorperazine Maleate] Other (See Comments)    Twitching Able to tolerate promethazine   Oxycodone     Nausea/vomiting   Vicodin [Hydrocodone-Acetaminophen] Itching, Nausea And Vomiting and Other (See Comments)    OK with Tylenol   Cymbalta [Duloxetine Hcl] Other (See Comments)    Irritability  Syncope    Ditropan [Oxybutynin] Other (See Comments)    Severe dry mouth   Elavil [Amitriptyline] Other (See Comments)    Irritability    Motrin [Ibuprofen] Other (See Comments)    Heartburn    Naprosyn [Naproxen] Other (See Comments)    Heartburn    Pyrimethamine Other (See Comments)    From Fansidar combination drug Triggers asthma   Sulfadoxine Other (See Comments)    From Fansidar combination drug Triggers asthma    Zestril [Lisinopril] Cough   Nifedical Xl [Nifedipine] Nausea And Vomiting   Reglan [Metoclopramide] Swelling and Other (See Comments)    Restlessness Able to take with Benadryl   Trandate [Labetalol] Nausea And Vomiting and Other (See Comments)    Triggers asthma     Family History  Problem Relation Age of Onset   Hypertension Mother    Coronary artery disease Mother    Hypertension Father    Stroke Father     Atrial fibrillation Father    Heart failure Maternal Grandfather    Heart attack Maternal Grandfather       Prior to Admission medications   Medication Sig Start Date End Date Taking? Authorizing Provider  acetaminophen (TYLENOL) 500 MG tablet Take 1,000 mg by mouth daily as needed for moderate pain, headache or fever.    [provider]  albuterol (VENTOLIN HFA) 108 (90 Base) MCG/ACT inhaler Inhale 2 puffs into the lungs every 4 (four) hours as needed for wheezing or shortness of breath. 10/15/22   Ellamae Sia, DO  bupivacaine,PF, (MARCAINE) 0.5 % SOLN injection Instill 15 mLs into bladder daily with heparin. 04/06/23     clonazePAM (KLONOPIN) 1 MG tablet Take 1 tablet (1 mg total) by mouth 3 (three) times daily as needed for anxiety. 04/14/23   Derwood Kaplan, MD  dicyclomine (BENTYL) 20 MG tablet Take 1 tablet (20 mg total) by mouth 2 (two) times daily. 03/29/23   Linwood Dibbles, MD  EPINEPHrine 0.3 mg/0.3 mL IJ SOAJ injection Inject 0.3 mg into the muscle as needed for anaphylaxis.  01/15/21   Ellamae Sia, DO  FLUoxetine (PROZAC) 40 MG capsule Take 1 capsule (40 mg total) by mouth daily. IM program 06/19/22   Quincy Simmonds, MD  heparin 16109 UNIT/ML injection Instill 4 mLs (40,000 Units total) into the bladder daily with bupivacaine. 04/06/23     hydrocortisone (CORTEF) 10 MG tablet Take 1 tablet (10 mg total) by mouth 2 (two) times daily. IM program 06/19/22   Quincy Simmonds, MD  irbesartan-hydrochlorothiazide (AVALIDE) 150-12.5 MG tablet Take 2 tablets by mouth in the morning and at bedtime. 10/06/22 10/06/23  Morene Crocker, MD  levothyroxine (SYNTHROID) 150 MCG tablet Take 1 tablet (150 mcg total) by mouth daily at 6 (six) AM. 10/06/22 10/06/23  Morene Crocker, MD  linaclotide [facility]) 145 MCG CAPS capsule Take 1 capsule (145 mcg total) by mouth daily before breakfast. 04/08/23 05/08/23  Charlynne Pander, MD  NEEDLE, DISP, 18 G (BD DISP NEEDLES) 18G X 1-1/2" MISC Use as  directed with daily bladder instillations 04/06/23     OLANZapine (ZYPREXA) 2.5 MG tablet Take 1 tablet (2.5 mg total) by mouth 3 (three) times daily as needed for up to 15 doses (nausea, intractible anxiety). 03/21/23   Lanae Boast, MD  OLANZapine zydis (ZYPREXA) 15 MG disintegrating tablet Take 0.5 tablets (7.5 mg total) by mouth at bedtime. 03/21/23 04/20/23  Lanae Boast, MD  ondansetron (ZOFRAN-ODT) 8 MG disintegrating tablet Take 1 tablet (8 mg total) by mouth every 8 (eight) hours as needed for nausea. 04/14/23   Derwood Kaplan, MD  oxyCODONE (ROXICODONE) 5 MG immediate release tablet Take 1 tablet (5 mg total) by mouth every 4 (four) hours as needed for severe pain (pain score 7-10). 04/14/23   Derwood Kaplan, MD  pantoprazole (PROTONIX) 40 MG tablet Take 1 tablet (40 mg total) by mouth 2 (two) times daily. 03/21/23 04/20/23  Lanae Boast, MD  promethazine (PHENERGAN) 25 MG suppository Place 1 suppository (25 mg total) rectally every 6 (six) hours as needed for nausea or vomiting. 03/21/23   Lanae Boast, MD  SYMBICORT 80-4.5 MCG/ACT inhaler Inhale 2 puffs into the lungs 2 (two) times daily as needed (shortness of breath).    [provider]  Syringe, Disposable, 20 ML MISC Use as directed for daily bladder instillations 04/06/23       Physical Exam: BP 110/69 (BP Location: Left Arm)   Pulse 89   Temp 97.9 F (36.6 C) (Oral)   Resp 16   LMP 04/05/2023   SpO2 97%   General: 33 y.o. year-old female well developed well nourished in no acute distress.  Alert and oriented x3. Cardiovascular: Regular rate and rhythm with no rubs or gallops.  No thyromegaly or JVD noted.  No lower extremity edema. 2/4 pulses in all 4 extremities. Respiratory: Clear to auscultation with no wheezes or rales. Good inspiratory effort. Abdomen: Soft lower abdominal tenderness.  Nondistended with normal bowel sounds x4 quadrants. Muskuloskeletal: No cyanosis, clubbing or edema noted bilaterally Neuro: CN II-XII intact,  strength, sensation, reflexes Skin: No ulcerative lesions noted or rashes Psychiatry: Judgement and insight appear normal. Mood is appropriate for condition and setting          Labs on Admission:  Basic Metabolic Panel: Recent Labs  Lab 04/21/23 1135  NA 136  K 3.8  CL 99  CO2 26  GLUCOSE 89  BUN 13  CREATININE 0.84  CALCIUM 9.7   Liver Function Tests: Recent Labs  Lab 04/21/23 1135  AST 40  ALT 48*  ALKPHOS 60  BILITOT 0.6  PROT 7.6  ALBUMIN 4.3   Recent Labs  Lab 04/21/23 1135  LIPASE 26   No results for input(s): "AMMONIA" in the last 168 hours. CBC: Recent Labs  Lab 04/21/23 1135  WBC 8.3  HGB 15.0  HCT 45.4  MCV 95.0  PLT 326   Cardiac Enzymes: No results for input(s): "CKTOTAL", "CKMB", "CKMBINDEX", "TROPONINI" in the last 168 hours.  BNP (last 3 results) No results for input(s): "BNP" in the last 8760 hours.  ProBNP (last 3 results) No results for input(s): "PROBNP" in the last 8760 hours.  CBG: No results for input(s): "GLUCAP" in the last 168 hours.  Radiological Exams on Admission: No results found.  EKG: I independently viewed the EKG done and my findings are as followed: Sinus rhythm rate of 91.  Nonspecific ST-T changes.  QTc 452.  Assessment/Plan Present on Admission:  Intractable nausea and vomiting  Active Problems:   Intractable nausea and vomiting  Intractable nausea and vomiting Treat underlying condition Follow up care IV fluid hydration LR 50 cc/h x 1 day  Chronic interstitial cystitis Resume home pain regimen and bowel regimen  Hypothyroidism Resume home levothyroxine  Hypertension BPs are currently soft Hold off home oral antihypertensives Monitor vital signs.  Chronic anxiety/depression Resume home regimen  IBS Resume home Linzess  Obesity BMI 32 Recommend weight loss outpatient with regular physical activity and healthy dieting.  Isolated mildly elevated ALT, 48 Nonspecific Monitor for  now   Time: 75 minutes.   DVT prophylaxis: Subcu Lovenox daily  Code Status: Full code  Family Communication: None at bedside.  Disposition Plan: Admitted to MedSurg unit.  Consults called: None.  Admission status: Observation status.   Status is: Observation    Darlin Drop MD [facility] Pager [phone]  If 7PM-7AM, please contact night-coverage [email or web address] Password [email or web address]  04/21/2023, 9:30 PM

## 2023-04-21 NOTE — ED Provider Triage Note (Signed)
 Emergency Medicine Provider Triage Evaluation Note  Krystal [name] , a 33 y.o. female  was evaluated in triage.  Pt complains of suprapubic pain and vomiting.  Patient reports that she typically has some level of urinary discomfort that she has interstitial cystitis but the discomfort is worsened and now she is progressing to intractable nausea and vomiting.  Patient was given Zofran ODT in triage but was unable to keep medication down is still actively vomiting.  She does also endorse some radiating pain in the abdomen towards the epigastrium since the vomiting started.  No hematemesis hematochezia.  Review of Systems  Positive: As above Negative: As above  Physical Exam  BP (!) 159/111   Pulse 89   Temp 98 F (36.7 C) (Oral)   Resp 19   LMP 04/05/2023   SpO2 97%  Gen:   Awake, visibly uncomfortable Resp:  Normal effort  MSK:   Moves extremities without difficulty  Other:  TTP in the suprapubic abdomen and epigastrium.  No CVA tenderness.  Medical Decision Making  Medically screening exam initiated at 12:21 PM.  Appropriate orders placed.  Krystal [name] was informed that the remainder of the evaluation will be completed by another provider, this initial triage assessment does not replace that evaluation, and the importance of remaining in the ED until their evaluation is complete.     Smitty Knudsen, PA-C 04/21/23 1222

## 2023-04-21 NOTE — ED Notes (Signed)
 Pt given graham crackers and a ginger ale per DO.

## 2023-04-21 NOTE — ED Provider Notes (Signed)
 [place] EMERGENCY DEPARTMENT AT [facility] Provider Note   CSN: [id number] Arrival date & time: 04/21/23  1049     History  Chief Complaint  Patient presents with   Pelvic Pain   Emesis   Nausea    Krystal [name] is a 33 y.o. female.  Patient is a 33 year old female with a past medical history of interstitial cystitis, anxiety, IBS, Hashimoto's presenting to the emergency department with abdominal pain.  Patient reports that she feels like she has been having a flare of her interstitial cystitis for the last several days.  She was seen in the ED 3 days ago and was given pain control with improvement.  She states that she has been using rectal Phenergan for her nausea at home however today the pain worsened and she had continuous vomiting despite the Phenergan.  She states that she has had associated dysuria but denies any hematuria.  Denies any fever.  She denies any abnormal vaginal discharge or bleeding and denies any STD concerns.  She states that she has had some associated diarrhea which is common for her.  Patient also reports that she started to develop some chest pain and tightness that feels similar to when she has had panic attacks in the past.  She states that she has talked with her urologist who sent her in some medications that she has been waiting for the pharmacy to fill.  The history is provided by the patient.  Pelvic Pain  Emesis      Home Medications Prior to Admission medications   Medication Sig Start Date End Date Taking? Authorizing Provider  acetaminophen (TYLENOL) 500 MG tablet Take 1,000 mg by mouth daily as needed for moderate pain, headache or fever.    [provider]  albuterol (VENTOLIN HFA) 108 (90 Base) MCG/ACT inhaler Inhale 2 puffs into the lungs every 4 (four) hours as needed for wheezing or shortness of breath. 10/15/22   Ellamae Sia, DO  bupivacaine,PF, (MARCAINE) 0.5 % SOLN injection Instill 15 mLs into bladder  daily with heparin. 04/06/23     clonazePAM (KLONOPIN) 1 MG tablet Take 1 tablet (1 mg total) by mouth 3 (three) times daily as needed for anxiety. 04/14/23   Derwood Kaplan, MD  dicyclomine (BENTYL) 20 MG tablet Take 1 tablet (20 mg total) by mouth 2 (two) times daily. 03/29/23   Linwood Dibbles, MD  EPINEPHrine 0.3 mg/0.3 mL IJ SOAJ injection Inject 0.3 mg into the muscle as needed for anaphylaxis. 01/15/21   Ellamae Sia, DO  FLUoxetine (PROZAC) 40 MG capsule Take 1 capsule (40 mg total) by mouth daily. IM program 06/19/22   Quincy Simmonds, MD  heparin 91478 UNIT/ML injection Instill 4 mLs (40,000 Units total) into the bladder daily with bupivacaine. 04/06/23     hydrocortisone (CORTEF) 10 MG tablet Take 1 tablet (10 mg total) by mouth 2 (two) times daily. IM program 06/19/22   Quincy Simmonds, MD  irbesartan-hydrochlorothiazide (AVALIDE) 150-12.5 MG tablet Take 2 tablets by mouth in the morning and at bedtime. 10/06/22 10/06/23  Morene Crocker, MD  levothyroxine (SYNTHROID) 150 MCG tablet Take 1 tablet (150 mcg total) by mouth daily at 6 (six) AM. 10/06/22 10/06/23  Morene Crocker, MD  linaclotide [facility]) 145 MCG CAPS capsule Take 1 capsule (145 mcg total) by mouth daily before breakfast. 04/08/23 05/08/23  Charlynne Pander, MD  NEEDLE, DISP, 18 G (BD DISP NEEDLES) 18G X 1-1/2" MISC Use as directed with daily bladder instillations 04/06/23  OLANZapine (ZYPREXA) 2.5 MG tablet Take 1 tablet (2.5 mg total) by mouth 3 (three) times daily as needed for up to 15 doses (nausea, intractible anxiety). 03/21/23   Lanae Boast, MD  OLANZapine zydis (ZYPREXA) 15 MG disintegrating tablet Take 0.5 tablets (7.5 mg total) by mouth at bedtime. 03/21/23 04/20/23  Lanae Boast, MD  ondansetron (ZOFRAN-ODT) 8 MG disintegrating tablet Take 1 tablet (8 mg total) by mouth every 8 (eight) hours as needed for nausea. 04/14/23   Derwood Kaplan, MD  oxyCODONE (ROXICODONE) 5 MG immediate release tablet Take 1 tablet (5 mg total)  by mouth every 4 (four) hours as needed for severe pain (pain score 7-10). 04/14/23   Derwood Kaplan, MD  pantoprazole (PROTONIX) 40 MG tablet Take 1 tablet (40 mg total) by mouth 2 (two) times daily. 03/21/23 04/20/23  Lanae Boast, MD  promethazine (PHENERGAN) 25 MG suppository Place 1 suppository (25 mg total) rectally every 6 (six) hours as needed for nausea or vomiting. 03/21/23   Lanae Boast, MD  SYMBICORT 80-4.5 MCG/ACT inhaler Inhale 2 puffs into the lungs 2 (two) times daily as needed (shortness of breath).    [provider]  Syringe, Disposable, 20 ML MISC Use as directed for daily bladder instillations 04/06/23         Allergies    Bee venom, Coreg [carvedilol], Red dye #40 (allura red), Compazine [prochlorperazine maleate], Oxycodone, Vicodin [hydrocodone-acetaminophen], Cymbalta [duloxetine hcl], Ditropan [oxybutynin], Elavil [amitriptyline], Motrin [ibuprofen], Naprosyn [naproxen], Pyrimethamine, Sulfadoxine, Zestril [lisinopril], Nifedical xl [nifedipine], Reglan [metoclopramide], and Trandate [labetalol]    Review of Systems   Review of Systems  Gastrointestinal:  Positive for vomiting.  Genitourinary:  Positive for pelvic pain.    Physical Exam Updated Vital Signs BP (!) 142/91   Pulse 88   Temp 98.7 F (37.1 C) (Oral)   Resp 17   LMP 04/05/2023   SpO2 95%  Physical Exam Vitals and nursing note reviewed.  Constitutional:      General: She is not in acute distress.    Appearance: Normal appearance.     Comments: Uncomfortable appearing  HENT:     Head: Normocephalic and atraumatic.     Nose: Nose normal.     Mouth/Throat:     Mouth: Mucous membranes are dry.     Pharynx: Oropharynx is clear.  Eyes:     Extraocular Movements: Extraocular movements intact.     Conjunctiva/sclera: Conjunctivae normal.  Cardiovascular:     Rate and Rhythm: Normal rate and regular rhythm.     Heart sounds: Normal heart sounds.  Pulmonary:     Effort: Pulmonary effort is  normal.     Breath sounds: Normal breath sounds.  Abdominal:     General: Abdomen is flat.     Palpations: Abdomen is soft.     Tenderness: There is abdominal tenderness (suprapubic). There is no right CVA tenderness, guarding or rebound.  Musculoskeletal:        General: Normal range of motion.     Cervical back: Normal range of motion.  Skin:    General: Skin is warm and dry.  Neurological:     General: No focal deficit present.     Mental Status: She is alert and oriented to person, place, and time.  Psychiatric:        Mood and Affect: Mood normal.        Behavior: Behavior normal.     ED Results / Procedures / Treatments   Labs (all labs ordered are listed,  but only abnormal results are displayed) Labs Reviewed  COMPREHENSIVE METABOLIC PANEL - Abnormal; Notable for the following components:      Result Value   ALT 48 (*)    All other components within normal limits  URINALYSIS, ROUTINE W REFLEX MICROSCOPIC - Abnormal; Notable for the following components:   APPearance CLOUDY (*)    All other components within normal limits  LIPASE, BLOOD  CBC  HCG, SERUM, QUALITATIVE    EKG EKG Interpretation Date/Time:  Tuesday April 21 2023 17:53:12 EST Ventricular Rate:  91 PR Interval:  135 QRS Duration:  117 QT Interval:  367 QTC Calculation: 452 R Axis:   -3  Text Interpretation: Sinus rhythm Probable left ventricular hypertrophy Nonspecific T abnormalities, lateral leads No significant change since last tracing Confirmed by Elayne Snare ([id number]) on 04/21/2023 7:02:56 PM  Radiology No results found.  Procedures Procedures    Medications Ordered in ED Medications  morphine (PF) 4 MG/ML injection 4 mg (has no administration in time range)  ondansetron (ZOFRAN) injection 4 mg (has no administration in time range)  ondansetron (ZOFRAN-ODT) disintegrating tablet 4 mg (4 mg Oral Given 04/21/23 1212)  ondansetron (ZOFRAN) injection 4 mg (4 mg Intravenous Given  04/21/23 1252)  fentaNYL (SUBLIMAZE) injection 50 mcg (50 mcg Intravenous Given 04/21/23 1414)  morphine (PF) 4 MG/ML injection 4 mg (4 mg Intravenous Given 04/21/23 1756)  droperidol (INAPSINE) 2.5 MG/ML injection 1.25 mg (1.25 mg Intravenous Given 04/21/23 1755)  LORazepam (ATIVAN) tablet 1 mg (1 mg Oral Given 04/21/23 1756)  lactated ringers bolus 1,000 mL (1,000 mLs Intravenous New Bag/Given 04/21/23 1756)  morphine (PF) 4 MG/ML injection 4 mg (4 mg Intravenous Given 04/21/23 1859)  diphenhydrAMINE (BENADRYL) injection 25 mg (25 mg Intravenous Given 04/21/23 1859)    ED Course/ Medical Decision Making/ A&P Clinical Course as of 04/21/23 2106  Tue Apr 21, 2023  2014 Upon reassessment, patient reports her pain has improved.  She has not had any vomiting since droperidol.  Will have p.o. challenge and likely will be able to be discharged home. [VK]  2104 Patient attempted PO challenged and vomited up the crackers and juice. Will be admitted for intractable pain and vomiting. [VK]    Clinical Course User Index [VK] Rexford Maus, DO                                 Medical Decision Making This patient presents to the ED with chief complaint(s) of abdominal pain with pertinent past medical history of interstitial cystitis, IBS, anxiety, Hashimoto's which further complicates the presenting complaint. The complaint involves an extensive differential diagnosis and also carries with it a high risk of complications and morbidity.    The differential diagnosis includes IC flare, UTI, dehydration, electrolyte abnormality, patient denies any abnormal discharge or STI concern making PID or STI unlikely, pregnancy, ectopic  Additional history obtained: Additional history obtained from N/A Records reviewed Care Everywhere/External Records  ED Course and Reassessment: On patient's arrival she was hemodynamically stable, comfortable appearing but in no acute distress.  She initially evaluated in  triage and had labs and urine performed that are within normal range.  She was given Zofran and fentanyl in triage without significant improvement of her symptoms and reports she is continued to vomit since then.  She will be given additional pain and nausea control as well as IV fluids.  She also reports that she feels anxious like  she might be having a panic attack and will have EKG and will be given Ativan and will be closely reassessed.  Independent labs interpretation:  The following labs were independently interpreted: within normal range  Independent visualization of imaging: - N/A  Consultation: - Consulted or discussed management/test interpretation w/ external professional: hospitalist  Consideration for admission or further workup: patient requires admission for intractable pain and vomiting Social Determinants of health: N/A    Amount and/or Complexity of Data Reviewed Labs: ordered.  Risk Prescription drug management. Decision regarding hospitalization.          Final Clinical Impression(s) / ED Diagnoses Final diagnoses:  Bladder spasms  Intractable vomiting    Rx / DC Orders ED Discharge Orders     None         Rexford Maus, DO 04/21/23 2106

## 2023-04-22 DIAGNOSIS — R112 Nausea with vomiting, unspecified: Secondary | ICD-10-CM | POA: Diagnosis present

## 2023-04-22 DIAGNOSIS — Z91030 Bee allergy status: Secondary | ICD-10-CM | POA: Diagnosis not present

## 2023-04-22 DIAGNOSIS — F603 Borderline personality disorder: Secondary | ICD-10-CM | POA: Diagnosis present

## 2023-04-22 DIAGNOSIS — K589 Irritable bowel syndrome without diarrhea: Secondary | ICD-10-CM | POA: Diagnosis present

## 2023-04-22 DIAGNOSIS — I10 Essential (primary) hypertension: Secondary | ICD-10-CM | POA: Diagnosis present

## 2023-04-22 DIAGNOSIS — E669 Obesity, unspecified: Secondary | ICD-10-CM | POA: Diagnosis present

## 2023-04-22 DIAGNOSIS — Z6832 Body mass index (BMI) 32.0-32.9, adult: Secondary | ICD-10-CM | POA: Diagnosis not present

## 2023-04-22 DIAGNOSIS — F4310 Post-traumatic stress disorder, unspecified: Secondary | ICD-10-CM | POA: Diagnosis present

## 2023-04-22 DIAGNOSIS — Z8249 Family history of ischemic heart disease and other diseases of the circulatory system: Secondary | ICD-10-CM | POA: Diagnosis not present

## 2023-04-22 DIAGNOSIS — F329 Major depressive disorder, single episode, unspecified: Secondary | ICD-10-CM | POA: Diagnosis present

## 2023-04-22 DIAGNOSIS — R109 Unspecified abdominal pain: Secondary | ICD-10-CM

## 2023-04-22 DIAGNOSIS — Z87442 Personal history of urinary calculi: Secondary | ICD-10-CM | POA: Diagnosis not present

## 2023-04-22 DIAGNOSIS — Z823 Family history of stroke: Secondary | ICD-10-CM | POA: Diagnosis not present

## 2023-04-22 DIAGNOSIS — Z886 Allergy status to analgesic agent status: Secondary | ICD-10-CM | POA: Diagnosis not present

## 2023-04-22 DIAGNOSIS — N3289 Other specified disorders of bladder: Secondary | ICD-10-CM | POA: Diagnosis present

## 2023-04-22 DIAGNOSIS — N3010 Interstitial cystitis (chronic) without hematuria: Secondary | ICD-10-CM | POA: Diagnosis present

## 2023-04-22 DIAGNOSIS — G43909 Migraine, unspecified, not intractable, without status migrainosus: Secondary | ICD-10-CM | POA: Diagnosis present

## 2023-04-22 DIAGNOSIS — Z7951 Long term (current) use of inhaled steroids: Secondary | ICD-10-CM | POA: Diagnosis not present

## 2023-04-22 DIAGNOSIS — Z79890 Hormone replacement therapy (postmenopausal): Secondary | ICD-10-CM | POA: Diagnosis not present

## 2023-04-22 DIAGNOSIS — Z885 Allergy status to narcotic agent status: Secondary | ICD-10-CM | POA: Diagnosis not present

## 2023-04-22 DIAGNOSIS — F419 Anxiety disorder, unspecified: Secondary | ICD-10-CM | POA: Diagnosis present

## 2023-04-22 DIAGNOSIS — E2740 Unspecified adrenocortical insufficiency: Secondary | ICD-10-CM | POA: Diagnosis present

## 2023-04-22 DIAGNOSIS — J45909 Unspecified asthma, uncomplicated: Secondary | ICD-10-CM | POA: Diagnosis present

## 2023-04-22 DIAGNOSIS — Z888 Allergy status to other drugs, medicaments and biological substances status: Secondary | ICD-10-CM | POA: Diagnosis not present

## 2023-04-22 DIAGNOSIS — Z5971 Insufficient health insurance coverage: Secondary | ICD-10-CM | POA: Diagnosis not present

## 2023-04-22 LAB — COMPREHENSIVE METABOLIC PANEL
ALT: 47 U/L — ABNORMAL HIGH (ref 0–44)
AST: 34 U/L (ref 15–41)
Albumin: 3.6 g/dL (ref 3.5–5.0)
Alkaline Phosphatase: 54 U/L (ref 38–126)
Anion gap: 11 (ref 5–15)
BUN: 14 mg/dL (ref 6–20)
CO2: 26 mmol/L (ref 22–32)
Calcium: 9 mg/dL (ref 8.9–10.3)
Chloride: 97 mmol/L — ABNORMAL LOW (ref 98–111)
Creatinine, Ser: 0.88 mg/dL (ref 0.44–1.00)
GFR, Estimated: >60 mL/min (ref >60)
Glucose, Bld: 94 mg/dL (ref 70–99)
Potassium: 3.8 mmol/L (ref 3.5–5.1)
Sodium: 134 mmol/L — ABNORMAL LOW (ref 135–145)
Total Bilirubin: 0.7 mg/dL (ref 0.0–1.2)
Total Protein: 6.8 g/dL (ref 6.5–8.1)

## 2023-04-22 LAB — CBC
HCT: 43.6 % (ref 36.0–46.0)
Hemoglobin: 14.1 g/dL (ref 12.0–15.0)
MCH: 31.5 pg (ref 26.0–34.0)
MCHC: 32.3 g/dL (ref 30.0–36.0)
MCV: 97.3 fL (ref 80.0–100.0)
Platelets: 290 10*3/uL (ref 150–400)
RBC: 4.48 MIL/uL (ref 3.87–5.11)
RDW: 13.9 % (ref 11.5–15.5)
WBC: 6.3 10*3/uL (ref 4.0–10.5)
nRBC: 0 % (ref 0.0–0.2)

## 2023-04-22 LAB — MAGNESIUM: Magnesium: 1.9 mg/dL (ref 1.7–2.4)

## 2023-04-22 LAB — PHOSPHORUS: Phosphorus: 3.9 mg/dL (ref 2.5–4.6)

## 2023-04-22 MED ORDER — OLANZAPINE 5 MG PO TBDP
7.5000 mg | ORAL_TABLET | Freq: Every day | ORAL | Status: DC
  Administered 2023-04-22 – 2023-04-26 (×5): 7.5 mg via ORAL
  Filled 2023-04-22 (×5): qty 1.5

## 2023-04-22 MED ORDER — IPRATROPIUM-ALBUTEROL 0.5-2.5 (3) MG/3ML IN SOLN: 3.0000 mL | RESPIRATORY_TRACT | Status: DC | PRN

## 2023-04-22 MED ORDER — POLYETHYLENE GLYCOL 3350 17 G PO PACK
17.0000 g | PACK | Freq: Every day | ORAL | Status: DC
  Administered 2023-04-22: 17 g via ORAL
  Filled 2023-04-22 (×2): qty 1

## 2023-04-22 MED ORDER — OLANZAPINE 5 MG PO TABS
2.5000 mg | ORAL_TABLET | Freq: Two times a day (BID) | ORAL | Status: DC | PRN
  Administered 2023-04-22 – 2023-04-26 (×6): 2.5 mg via ORAL
  Filled 2023-04-22 (×6): qty 1

## 2023-04-22 MED ORDER — HYDROCHLOROTHIAZIDE 12.5 MG PO TABS
12.5000 mg | ORAL_TABLET | Freq: Every day | ORAL | Status: DC
  Administered 2023-04-22 – 2023-04-27 (×6): 12.5 mg via ORAL
  Filled 2023-04-22 (×6): qty 1

## 2023-04-22 MED ORDER — IRBESARTAN-HYDROCHLOROTHIAZIDE 150-12.5 MG PO TABS: 1.0000 | ORAL_TABLET | Freq: Every day | ORAL | Status: DC

## 2023-04-22 MED ORDER — PANTOPRAZOLE SODIUM 40 MG PO TBEC
40.0000 mg | DELAYED_RELEASE_TABLET | Freq: Two times a day (BID) | ORAL | Status: DC
  Administered 2023-04-22 – 2023-04-27 (×10): 40 mg via ORAL
  Filled 2023-04-22 (×10): qty 1

## 2023-04-22 MED ORDER — IRBESARTAN 150 MG PO TABS
150.0000 mg | ORAL_TABLET | Freq: Every day | ORAL | Status: DC
  Administered 2023-04-22 – 2023-04-27 (×6): 150 mg via ORAL
  Filled 2023-04-22 (×6): qty 1

## 2023-04-22 MED ORDER — HYDROCORTISONE 10 MG PO TABS
10.0000 mg | ORAL_TABLET | Freq: Two times a day (BID) | ORAL | Status: DC
  Administered 2023-04-22 – 2023-04-25 (×7): 10 mg via ORAL
  Filled 2023-04-22 (×7): qty 1

## 2023-04-22 MED ORDER — SODIUM CHLORIDE 0.9 % IV SOLN
12.5000 mg | Freq: Three times a day (TID) | INTRAVENOUS | Status: DC | PRN
  Administered 2023-04-22 – 2023-04-27 (×7): 12.5 mg via INTRAVENOUS
  Filled 2023-04-22 (×5): qty 12.5
  Filled 2023-04-22 (×2): qty 0.5
  Filled 2023-04-22 (×2): qty 12.5

## 2023-04-22 MED ORDER — FLUOXETINE HCL 20 MG PO CAPS
40.0000 mg | ORAL_CAPSULE | Freq: Every day | ORAL | Status: DC
  Administered 2023-04-22 – 2023-04-27 (×6): 40 mg via ORAL
  Filled 2023-04-22 (×6): qty 2

## 2023-04-22 MED ORDER — LACTATED RINGERS IV SOLN: INTRAVENOUS | Status: DC

## 2023-04-22 MED ORDER — BUPIVACAINE HCL (PF) 0.5 % IJ SOLN: 15.0000 mL | Freq: Every day | INTRAMUSCULAR | Status: DC

## 2023-04-22 MED ORDER — CLONAZEPAM 0.5 MG PO TABS
0.5000 mg | ORAL_TABLET | Freq: Two times a day (BID) | ORAL | Status: DC | PRN
  Administered 2023-04-22 – 2023-04-26 (×6): 0.5 mg via ORAL
  Filled 2023-04-22 (×6): qty 1

## 2023-04-22 NOTE — TOC Initial Note (Signed)
 Transition of Care [facility]) - Initial/Assessment Note    Patient Details  Name: Krystal [name] MRN: [id number] Date of Birth: 1990/02/27  Transition of Care [facility]) CM/SW Contact:    Diona Browner, LCSW Phone Number: 04/22/2023, 1:13 PM  Clinical Narrative:                 Resources for transportation, housing, and utilities added to AVS.   Expected Discharge Plan: Home/Self Care Barriers to Discharge: Continued Medical Work up   Patient Goals and CMS Choice Patient states their goals for this hospitalization and ongoing recovery are:: return home          Expected Discharge Plan and Services       Living arrangements for the past 2 months: Single Family Home                                      Prior Living Arrangements/Services Living arrangements for the past 2 months: Single Family Home Lives with:: Spouse Patient language and need for interpreter reviewed:: Yes Do you feel safe going back to the place where you live?: Yes      Need for Family Participation in Patient Care: No (Comment) Care giver support system in place?: Yes (comment)   Criminal Activity/Legal Involvement Pertinent to Current Situation/Hospitalization: No - Comment as needed  Activities of Daily Living   ADL Screening (condition at time of admission) Independently performs ADLs?: Yes (appropriate for developmental age) Is the patient deaf or have difficulty hearing?: No Does the patient have difficulty seeing, even when wearing glasses/contacts?: Yes Does the patient have difficulty concentrating, remembering, or making decisions?: No  Permission Sought/Granted                  Emotional Assessment Appearance:: Appears stated age Attitude/Demeanor/Rapport: Engaged Affect (typically observed): Accepting Orientation: : Oriented to Self, Oriented to Place, Oriented to  Time, Oriented to Situation Alcohol / Substance Use: Not Applicable Psych Involvement: No  (comment)  Admission diagnosis:  Bladder spasms [N32.89] Intractable vomiting [R11.10] Intractable nausea and vomiting [R11.2] Patient Active Problem List   Diagnosis Date Noted   Intractable nausea and vomiting 03/13/2023   Intractable vomiting with nausea 03/13/2023   Encounter for screening involving social determinants of health (SDoH) 10/07/2022   Chronic interstitial cystitis 05/05/2022   Chronic diarrhea 04/29/2022   Lower abdominal pain 04/28/2022   Adrenal insufficiency (HCC) 04/26/2022   Bradycardia 04/24/2022   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 03/05/2022   Moderate persistent asthma with acute exacerbation 12/04/2021   Cervicalgia 10/31/2021   Borderline personality disorder in adult [facility]) 10/21/2021   Anisocoria 07/02/2021   History of excision of intestinal structure 05/23/2021   Attention deficit disorder 04/17/2021   Chronic fatigue syndrome 04/17/2021   Anosmia 04/17/2021   Chronic urticaria 01/15/2021   Local reaction to hymenoptera sting 01/15/2021   Perennial allergic rhinitis 01/15/2021   MDD (major depressive disorder), recurrent episode, severe (HCC) 11/19/2020   Fibromyalgia 05/11/2020   Gastroesophageal reflux disease 05/11/2020   Interstitial cystitis 05/11/2020   PTSD (post-traumatic stress disorder) 05/11/2020   Primary insomnia 05/11/2020   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   Hypothyroidism due to Hashimoto's thyroiditis 10/03/2019   Anxiety 06/16/2019   Asthma 06/07/2019   Obesity 06/07/2019   Lumbar spondylosis 06/23/2018   Sacroiliac inflammation (HCC) 06/23/2018   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 [place] [address] Phone: [phone] Fax: [phone]  [facility] #[id number] [place], [place] - [address] AT [facility] OF [address] & [address] [place] [address] Phone: [phone] Fax: [phone]  [facility] [address] [place] [address] Phone: [phone] Fax: [phone]  [place] [facility] [address] [place] [address] Phone: [phone] Fax: [phone]  [facility] [address] [place] [address] Phone: [phone] Fax: [phone]     Social Drivers of Health (SDOH) Social History: SDOH Screenings   Food Insecurity: Food Insecurity Present (04/22/2023)  Housing: High Risk (04/22/2023)  Transportation Needs: Unmet Transportation Needs (04/22/2023)  Utilities: At Risk (04/22/2023)  Alcohol Screen: Low Risk  (04/09/2022)  Depression (PHQ2-9): High Risk (10/06/2022)  Physical Activity: Inactive (04/09/2022)  Social Connections: Moderately Integrated (04/22/2023)  Tobacco Use: Low Risk  (04/21/2023)   SDOH Interventions: Food Insecurity Interventions: [employer] Provided, Inpatient TOC Housing Interventions: Intervention Not Indicated, Inpatient TOC Transportation Interventions: Intervention Not Indicated, Inpatient TOC Utilities Interventions: Community Resources Provided, Inpatient TOC   Readmission Risk Interventions    03/17/2023    1:59 PM 04/30/2022    3:03 PM  Readmission Risk Prevention Plan  Transportation Screening Complete Complete  PCP or Specialist Appt within 3-5 Days Complete   HRI or Home Care Consult Complete   Social Work Consult for Recovery Care Planning/Counseling Complete   Palliative Care Screening Complete   Medication Review [employer]) Complete Referral to Pharmacy  PCP or Specialist appointment within  3-5 days of discharge  --  HRI or Home Care Consult  Complete  SW Recovery Care/Counseling Consult  Complete  Palliative Care Screening  Not Applicable  Skilled Nursing Facility  Not Applicable

## 2023-04-22 NOTE — Plan of Care (Signed)

## 2023-04-22 NOTE — Progress Notes (Addendum)
 PROGRESS NOTE  Krystal [name] ZOX:[id number] DOB: 10-03-1990 DOA: 04/21/2023 PCP: Patient, No Pcp Per  HPI/Recap of past 24 hours: Krystal [name] is a 33 y.o. female with medical history significant for chronic interstitial cystitis with plan for cystoscopy on 05/22/2023, chronic anxiety/depression, IBS, hypertension, who presents to the ER with complaints of bladder spasm x 2 days, associated with nausea and vomiting, prompting her to come to the ER for further evaluation.  States the pain is similar to prior interstitial cystitis flare which for her usually is induced by stress.  States she is currently having a stressful time in her life. In the ER, afebrile with no leukocytosis.  UA is negative for pyuria. VS and Lab work essentially unremarkable.  The patient received multiple rounds of IV opiate based analgesics with persistent lower abdominal pain.  EDP requested admission for further management and control of her pain.  Admitted by [facility], hospitalist service.     Today, patient still complaining of pain around her urethra as per patient as well as bladder spasms.  Still requesting IV narcotics.  Reports she is unable to urinate.  Bladder scan showed about 190 mL of urine.  Reports she usually does self intermittent catheterization during interstitial cystitis flareup.     Assessment/Plan: Active Problems:   Intractable nausea and vomiting    Intractable nausea and vomiting Intractable abdominal pain Associated with chronic interstitial cystitis flareup Antiemetics, pain management IV fluids   Chronic interstitial cystitis Resume home pain regimen and bowel regimen   Hypothyroidism levothyroxine   Hypertension BP stable Continue home BP meds daily for now   Chronic anxiety/depression Resume home regimen   IBS Resume home Linzess  Reports adrenal insufficiency On hydrocortisone, continue   Obesity BMI 32 Recommend lifestyle modification        Estimated body mass index is 32.69 kg/m as calculated from the following:   Height as of this encounter: 5\' 11"  (1.803 m).   Weight as of this encounter: 106.3 kg.     Code Status: Full  Family Communication: None at bedside  Disposition Plan: Status is: Inpatient Remains inpatient appropriate because: Level of care      Consultants: None  Procedures: None  Antimicrobials: None  DVT prophylaxis: Lovenox   Objective: Vitals:   04/22/23 0436 04/22/23 0804 04/22/23 1114 04/22/23 1406  BP: 125/81 130/80 (!) 144/103 (!) 143/92  Pulse: 79 84 82 84  Resp: 14 20 20 20   Temp: 98 F (36.7 C) 98.1 F (36.7 C) 97.7 F (36.5 C) 98.2 F (36.8 C)  TempSrc: Oral Oral Oral Oral  SpO2: 95% 96% 96% 95%  Weight: 106.3 kg     Height:       No intake or output data in the 24 hours ending 04/22/23 1521 Filed Weights   04/22/23 0017 04/22/23 0436  Weight: 104.3 kg 106.3 kg    Exam: General: NAD  Cardiovascular: S1, S2 present Respiratory: CTAB Abdomen: Soft, tender, nondistended, bowel sounds present Musculoskeletal: No bilateral pedal edema noted, L hand amputated Skin: Noted post graft on RUE Psychiatry: Normal mood     Data Reviewed: CBC: Recent Labs  Lab 04/21/23 1135 04/22/23 0601  WBC 8.3 6.3  HGB 15.0 14.1  HCT 45.4 43.6  MCV 95.0 97.3  PLT 326 290   Basic Metabolic Panel: Recent Labs  Lab 04/21/23 1135 04/22/23 0601  NA 136 134*  K 3.8 3.8  CL 99 97*  CO2 26 26  GLUCOSE 89 94  BUN  13 14  CREATININE 0.84 0.88  CALCIUM 9.7 9.0  MG  --  1.9  PHOS  --  3.9   GFR: Estimated Creatinine Clearance: 120.9 mL/min (by C-G formula based on SCr of 0.88 mg/dL). Liver Function Tests: Recent Labs  Lab 04/21/23 1135 04/22/23 0601  AST 40 34  ALT 48* 47*  ALKPHOS 60 54  BILITOT 0.6 0.7  PROT 7.6 6.8  ALBUMIN 4.3 3.6   Recent Labs  Lab 04/21/23 1135  LIPASE 26   No results for input(s): "AMMONIA" in the last 168 hours. Coagulation  Profile: No results for input(s): "INR", "PROTIME" in the last 168 hours. Cardiac Enzymes: No results for input(s): "CKTOTAL", "CKMB", "CKMBINDEX", "TROPONINI" in the last 168 hours. BNP (last 3 results) No results for input(s): "PROBNP" in the last 8760 hours. HbA1C: No results for input(s): "HGBA1C" in the last 72 hours. CBG: No results for input(s): "GLUCAP" in the last 168 hours. Lipid Profile: No results for input(s): "CHOL", "HDL", "LDLCALC", "TRIG", "CHOLHDL", "LDLDIRECT" in the last 72 hours. Thyroid Function Tests: No results for input(s): "TSH", "T4TOTAL", "FREET4", "T3FREE", "THYROIDAB" in the last 72 hours. Anemia Panel: No results for input(s): "VITAMINB12", "FOLATE", "FERRITIN", "TIBC", "IRON", "RETICCTPCT" in the last 72 hours. Urine analysis:    Component Value Date/Time   COLORURINE YELLOW 04/21/2023 1135   APPEARANCEUR CLOUDY (A) 04/21/2023 1135   APPEARANCEUR Clear 03/25/2023 1519   LABSPEC 1.012 04/21/2023 1135   PHURINE 7.0 04/21/2023 1135   GLUCOSEU NEGATIVE 04/21/2023 1135   HGBUR NEGATIVE 04/21/2023 1135   BILIRUBINUR NEGATIVE 04/21/2023 1135   BILIRUBINUR Negative 03/25/2023 1519   KETONESUR NEGATIVE 04/21/2023 1135   PROTEINUR NEGATIVE 04/21/2023 1135   NITRITE NEGATIVE 04/21/2023 1135   LEUKOCYTESUR NEGATIVE 04/21/2023 1135   Sepsis Labs: @LABRCNTIP (procalcitonin:4,lacticidven:4)  )No results found for this or any previous visit (from the past 240 hours).    Studies: No results found.  Scheduled Meds:  enoxaparin (LOVENOX) injection  40 mg Subcutaneous Q24H   FLUoxetine  40 mg Oral Daily   hydrocortisone  10 mg Oral BID   levothyroxine  150 mcg Oral Q0600   linaclotide  145 mcg Oral QAC breakfast   OLANZapine zydis  7.5 mg Oral QHS   pantoprazole  40 mg Oral BID   polyethylene glycol  17 g Oral Daily    Continuous Infusions:  lactated ringers 50 mL/hr at 04/22/23 0939   promethazine (PHENERGAN) injection (IM or IVPB)       LOS: 0  days     Briant Cedar, MD [facility]  If 7PM-7AM, please contact night-coverage [email or web address] 04/22/2023, 3:21 PM

## 2023-04-23 LAB — BASIC METABOLIC PANEL WITH GFR
Anion gap: 10 (ref 5–15)
BUN: 11 mg/dL (ref 6–20)
CO2: 24 mmol/L (ref 22–32)
Calcium: 8.8 mg/dL — ABNORMAL LOW (ref 8.9–10.3)
Chloride: 100 mmol/L (ref 98–111)
Creatinine, Ser: 0.77 mg/dL (ref 0.44–1.00)
GFR, Estimated: >60 mL/min (ref >60)
Glucose, Bld: 92 mg/dL (ref 70–99)
Potassium: 4.1 mmol/L (ref 3.5–5.1)
Sodium: 134 mmol/L — ABNORMAL LOW (ref 135–145)

## 2023-04-23 MED ORDER — OXYCODONE HCL 5 MG PO TABS
5.0000 mg | ORAL_TABLET | Freq: Four times a day (QID) | ORAL | Status: DC | PRN
  Administered 2023-04-23 – 2023-04-27 (×14): 5 mg via ORAL
  Filled 2023-04-23 (×14): qty 1

## 2023-04-23 MED ORDER — HYDROMORPHONE HCL 1 MG/ML IJ SOLN
0.5000 mg | INTRAMUSCULAR | Status: DC | PRN
  Administered 2023-04-23 – 2023-04-26 (×13): 0.5 mg via INTRAVENOUS
  Filled 2023-04-23 (×13): qty 0.5

## 2023-04-23 MED ORDER — MORPHINE SULFATE (PF) 2 MG/ML IV SOLN
2.0000 mg | Freq: Four times a day (QID) | INTRAVENOUS | Status: DC | PRN
  Administered 2023-04-23: 2 mg via INTRAVENOUS
  Filled 2023-04-23: qty 1

## 2023-04-23 MED ORDER — TRAMADOL HCL 50 MG PO TABS
50.0000 mg | ORAL_TABLET | Freq: Two times a day (BID) | ORAL | Status: DC | PRN
  Administered 2023-04-23 – 2023-04-27 (×6): 50 mg via ORAL
  Filled 2023-04-23 (×6): qty 1

## 2023-04-23 MED ORDER — MORPHINE SULFATE (PF) 2 MG/ML IV SOLN
2.0000 mg | INTRAVENOUS | Status: DC | PRN
  Administered 2023-04-23: 2 mg via INTRAVENOUS
  Filled 2023-04-23: qty 1

## 2023-04-23 NOTE — Plan of Care (Signed)

## 2023-04-23 NOTE — Plan of Care (Signed)
  Problem: Education: Goal: Knowledge of General Education information will improve Description: Including pain rating scale, medication(s)/side effects and non-pharmacologic comfort measures 04/23/2023 0614 by Gaynelle Arabian, RN Outcome: Progressing 04/23/2023 0614 by Gaynelle Arabian, RN Outcome: Progressing   Problem: Health Behavior/Discharge Planning: Goal: Ability to manage health-related needs will improve 04/23/2023 0614 by Gaynelle Arabian, RN Outcome: Progressing 04/23/2023 0614 by Gaynelle Arabian, RN Outcome: Progressing   Problem: Clinical Measurements: Goal: Ability to maintain clinical measurements within normal limits will improve 04/23/2023 0614 by Gaynelle Arabian, RN Outcome: Progressing 04/23/2023 0614 by Gaynelle Arabian, RN Outcome: Progressing Goal: Will remain free from infection 04/23/2023 0614 by Gaynelle Arabian, RN Outcome: Progressing 04/23/2023 0614 by Gaynelle Arabian, RN Outcome: Progressing Goal: Diagnostic test results will improve 04/23/2023 0614 by Gaynelle Arabian, RN Outcome: Progressing 04/23/2023 0614 by Gaynelle Arabian, RN Outcome: Progressing Goal: Respiratory complications will improve 04/23/2023 0614 by Gaynelle Arabian, RN Outcome: Progressing 04/23/2023 0614 by Gaynelle Arabian, RN Outcome: Progressing Goal: Cardiovascular complication will be avoided 04/23/2023 0614 by Gaynelle Arabian, RN Outcome: Progressing 04/23/2023 0614 by Gaynelle Arabian, RN Outcome: Progressing   Problem: Activity: Goal: Risk for activity intolerance will decrease 04/23/2023 0614 by Gaynelle Arabian, RN Outcome: Progressing 04/23/2023 0614 by Gaynelle Arabian, RN Outcome: Progressing   Problem: Nutrition: Goal: Adequate nutrition will be maintained 04/23/2023 0614 by Gaynelle Arabian, RN Outcome: Progressing 04/23/2023 0614 by Gaynelle Arabian, RN Outcome: Progressing   Problem: Coping: Goal: Level of anxiety will  decrease 04/23/2023 0614 by Gaynelle Arabian, RN Outcome: Progressing 04/23/2023 0614 by Gaynelle Arabian, RN Outcome: Progressing   Problem: Elimination: Goal: Will not experience complications related to bowel motility 04/23/2023 0614 by Gaynelle Arabian, RN Outcome: Progressing 04/23/2023 0614 by Gaynelle Arabian, RN Outcome: Progressing Goal: Will not experience complications related to urinary retention 04/23/2023 0614 by Gaynelle Arabian, RN Outcome: Progressing 04/23/2023 0614 by Gaynelle Arabian, RN Outcome: Progressing   Problem: Pain Managment: Goal: General experience of comfort will improve and/or be controlled 04/23/2023 0614 by Gaynelle Arabian, RN Outcome: Progressing 04/23/2023 0614 by Gaynelle Arabian, RN Outcome: Progressing   Problem: Safety: Goal: Ability to remain free from injury will improve 04/23/2023 0614 by Gaynelle Arabian, RN Outcome: Progressing 04/23/2023 0614 by Gaynelle Arabian, RN Outcome: Progressing   Problem: Skin Integrity: Goal: Risk for impaired skin integrity will decrease 04/23/2023 0614 by Gaynelle Arabian, RN Outcome: Progressing 04/23/2023 0614 by Gaynelle Arabian, RN Outcome: Progressing

## 2023-04-23 NOTE — Progress Notes (Signed)
 PROGRESS NOTE  [name] EAV:[id number] DOB: [birth year] DOA: 04/21/2023 PCP: Patient, No Pcp Per  HPI/Recap of past 24 hours: Krystal [name] is a 33 y.o. female with medical history significant for chronic interstitial cystitis with plan for cystoscopy on 05/22/2023, chronic anxiety/depression, IBS, hypertension, who presents to the ER with complaints of bladder spasm x 2 days, associated with nausea and vomiting, prompting her to come to the ER for further evaluation.  States the pain is similar to prior interstitial cystitis flare which for her usually is induced by stress.  States she is currently having a stressful time in her life. In the ER, afebrile with no leukocytosis.  UA is negative for pyuria. VS and Lab work essentially unremarkable.  The patient received multiple rounds of IV opiate based analgesics with persistent lower abdominal pain.  EDP requested admission for further management and control of her pain.  Admitted by [facility], hospitalist service.     Today, saw pt this am, denied further nausea/vomiting and was requesting an advanced diet. This afternoon, had an episode of nausea with vomiting, was noted to be anxious.     Assessment/Plan: Active Problems:   Intractable nausea and vomiting    Intractable nausea and vomiting Intractable abdominal pain Associated with chronic interstitial cystitis flareup Recent CT abdomen pelvis and recent CT renal stone done both in January 2025 all negative for any acute intra-abdominal process Antiemetics, pain management Full liquid diet   Chronic interstitial cystitis Resume pain regimen and bowel regimen   Hypothyroidism levothyroxine   Hypertension BP stable Continue home BP meds daily for now   Chronic anxiety/depression Resume home regimen   IBS Resume home Linzess  Reports adrenal insufficiency On hydrocortisone, continue   Obesity BMI 32 Recommend lifestyle modification       Estimated body  mass index is 32.69 kg/m as calculated from the following:   Height as of this encounter: 5\' 11"  (1.803 m).   Weight as of this encounter: 106.3 kg.     Code Status: Full  Family Communication: None at bedside  Disposition Plan: Status is: Inpatient Remains inpatient appropriate because: Level of care      Consultants: None  Procedures: None  Antimicrobials: None  DVT prophylaxis: Lovenox   Objective: Vitals:   04/22/23 1114 04/22/23 1406 04/22/23 2202 04/23/23 0541  BP: (!) 144/103 (!) 143/92 132/82 (!) 140/99  Pulse: 82 84 86 65  Resp: 20 20 14 14   Temp: 97.7 F (36.5 C) 98.2 F (36.8 C) 98 F (36.7 C) 98.1 F (36.7 C)  TempSrc: Oral Oral Oral Oral  SpO2: 96% 95% 97% 99%  Weight:      Height:        Intake/Output Summary (Last 24 hours) at 04/23/2023 1522 Last data filed at 04/23/2023 1246 Gross per 24 hour  Intake 477 ml  Output 400 ml  Net 77 ml   Filed Weights   04/22/23 0017 04/22/23 0436  Weight: 104.3 kg 106.3 kg    Exam: General: NAD  Cardiovascular: S1, S2 present Respiratory: CTAB Abdomen: Soft, tender, nondistended, bowel sounds present Musculoskeletal: No bilateral pedal edema noted, L hand amputated Skin: Noted post graft on RUE Psychiatry: Normal mood     Data Reviewed: CBC: Recent Labs  Lab 04/21/23 1135 04/22/23 0601  WBC 8.3 6.3  HGB 15.0 14.1  HCT 45.4 43.6  MCV 95.0 97.3  PLT 326 290   Basic Metabolic Panel: Recent Labs  Lab 04/21/23 1135 04/22/23 0601 04/23/23 7829  NA 136 134* 134*  K 3.8 3.8 4.1  CL 99 97* 100  CO2 26 26 24   GLUCOSE 89 94 92  BUN 13 14 11   CREATININE 0.84 0.88 0.77  CALCIUM 9.7 9.0 8.8*  MG  --  1.9  --   PHOS  --  3.9  --    GFR: Estimated Creatinine Clearance: 133 mL/min (by C-G formula based on SCr of 0.77 mg/dL). Liver Function Tests: Recent Labs  Lab 04/21/23 1135 04/22/23 0601  AST 40 34  ALT 48* 47*  ALKPHOS 60 54  BILITOT 0.6 0.7  PROT 7.6 6.8  ALBUMIN 4.3 3.6    Recent Labs  Lab 04/21/23 1135  LIPASE 26   No results for input(s): "AMMONIA" in the last 168 hours. Coagulation Profile: No results for input(s): "INR", "PROTIME" in the last 168 hours. Cardiac Enzymes: No results for input(s): "CKTOTAL", "CKMB", "CKMBINDEX", "TROPONINI" in the last 168 hours. BNP (last 3 results) No results for input(s): "PROBNP" in the last 8760 hours. HbA1C: No results for input(s): "HGBA1C" in the last 72 hours. CBG: No results for input(s): "GLUCAP" in the last 168 hours. Lipid Profile: No results for input(s): "CHOL", "HDL", "LDLCALC", "TRIG", "CHOLHDL", "LDLDIRECT" in the last 72 hours. Thyroid Function Tests: No results for input(s): "TSH", "T4TOTAL", "FREET4", "T3FREE", "THYROIDAB" in the last 72 hours. Anemia Panel: No results for input(s): "VITAMINB12", "FOLATE", "FERRITIN", "TIBC", "IRON", "RETICCTPCT" in the last 72 hours. Urine analysis:    Component Value Date/Time   COLORURINE YELLOW 04/21/2023 1135   APPEARANCEUR CLOUDY (A) 04/21/2023 1135   APPEARANCEUR Clear 03/25/2023 1519   LABSPEC 1.012 04/21/2023 1135   PHURINE 7.0 04/21/2023 1135   GLUCOSEU NEGATIVE 04/21/2023 1135   HGBUR NEGATIVE 04/21/2023 1135   BILIRUBINUR NEGATIVE 04/21/2023 1135   BILIRUBINUR Negative 03/25/2023 1519   KETONESUR NEGATIVE 04/21/2023 1135   PROTEINUR NEGATIVE 04/21/2023 1135   NITRITE NEGATIVE 04/21/2023 1135   LEUKOCYTESUR NEGATIVE 04/21/2023 1135   Sepsis Labs: @LABRCNTIP (procalcitonin:4,lacticidven:4)  )No results found for this or any previous visit (from the past 240 hours).    Studies: No results found.  Scheduled Meds:  enoxaparin (LOVENOX) injection  40 mg Subcutaneous Q24H   FLUoxetine  40 mg Oral Daily   irbesartan  150 mg Oral Daily   And   hydrochlorothiazide  12.5 mg Oral Daily   hydrocortisone  10 mg Oral BID   levothyroxine  150 mcg Oral Q0600   linaclotide  145 mcg Oral QAC breakfast   OLANZapine zydis  7.5 mg Oral QHS    pantoprazole  40 mg Oral BID   polyethylene glycol  17 g Oral Daily    Continuous Infusions:  promethazine (PHENERGAN) injection (IM or IVPB) 12.5 mg (04/23/23 0747)     LOS: 1 day     Briant Cedar, MD [facility]  If 7PM-7AM, please contact night-coverage [email or web address] 04/23/2023, 3:22 PM

## 2023-04-23 NOTE — Progress Notes (Addendum)
 Pt had an episode of vomiting, Dr Warrick Parisian informed, diet advanced to regular changed back to Full Liquids. Pt reportedly anxious, encouraged to breathe in and out. Will continue to monitor  Pt had another Episode of vomiting at 1609, Anti-emetic administered, will continue to monitor.

## 2023-04-24 ENCOUNTER — Inpatient Hospital Stay (HOSPITAL_COMMUNITY): Payer: Self-pay

## 2023-04-24 LAB — URINALYSIS, ROUTINE W REFLEX MICROSCOPIC
Bacteria, UA: NONE SEEN
Bilirubin Urine: NEGATIVE
Glucose, UA: NEGATIVE mg/dL
Hgb urine dipstick: NEGATIVE
Ketones, ur: NEGATIVE mg/dL
Nitrite: NEGATIVE
Protein, ur: NEGATIVE mg/dL
Specific Gravity, Urine: 1.01 (ref 1.005–1.030)
pH: 7 (ref 5.0–8.0)

## 2023-04-24 MED ORDER — PHENAZOPYRIDINE HCL 100 MG PO TABS
100.0000 mg | ORAL_TABLET | Freq: Three times a day (TID) | ORAL | Status: DC
  Filled 2023-04-24 (×4): qty 1

## 2023-04-24 MED ORDER — OXYCODONE HCL 5 MG PO TABS
5.0000 mg | ORAL_TABLET | Freq: Once | ORAL | Status: AC
  Administered 2023-04-24: 5 mg via ORAL
  Filled 2023-04-24: qty 1

## 2023-04-24 MED ORDER — PHENAZOPYRIDINE HCL 200 MG PO TABS: Freq: Once | ORAL | Status: DC

## 2023-04-24 MED ORDER — PROMETHAZINE HCL 25 MG/ML IJ SOLN
12.5000 mg | Freq: Four times a day (QID) | INTRAMUSCULAR | Status: DC | PRN
  Administered 2023-04-24 – 2023-04-26 (×3): 12.5 mg via INTRAMUSCULAR
  Filled 2023-04-24 (×4): qty 1

## 2023-04-24 NOTE — Progress Notes (Signed)
 Messaged provider   here for interstitial cystitis patient c/o burning with urination, looks like she had a UA on 2/25 that was cloudy but negative for everything else last VS 147/88 (107) T. 98.6 P 100 plus this is consistent with her dx. wondering if pyridium would help her at all or do you want to do a urine culture   Awaiting response

## 2023-04-24 NOTE — Plan of Care (Signed)

## 2023-04-24 NOTE — Progress Notes (Signed)
 Patient's pain managed with prn meds for anxiety, pain and nausea this shift, had x 2 vomiting episodes after drinking coffee with milk or products containing milk, educated to drink clear liquids while feeling nauseous and wait a whole day until she goes without vomiting before trying these types of products, verbalized understanding, in bed resting at tihs time, call light in reach

## 2023-04-24 NOTE — Progress Notes (Signed)
 PROGRESS NOTE  Madge Therrien Bardon ZOX:[id number] DOB: [birth year] DOA: 04/21/2023 PCP: Patient, No Pcp Per  HPI/Recap of past 24 hours: Krystal [name] is a 33 y.o. female with medical history significant for chronic interstitial cystitis with plan for cystoscopy on 05/22/2023, chronic anxiety/depression, IBS, hypertension, who presents to the ER with complaints of bladder spasm x 2 days, associated with nausea and vomiting, prompting her to come to the ER for further evaluation.  States the pain is similar to prior interstitial cystitis flare which for her usually is induced by stress.  States she is currently having a stressful time in her life. In the ER, afebrile with no leukocytosis.  UA is negative for pyuria. VS and Lab work essentially unremarkable.  The patient received multiple rounds of IV opiate based analgesics with persistent lower abdominal pain.  EDP requested admission for further management and control of her pain.  Admitted by [facility], hospitalist service.     Today, pt continues to report vomiting and nausea, also dysuria with continuous bladder spasms. Currently able to void, no retention noted. Reports loose stools, linzess held. Symptoms possibly largely due to stress/anxiety/depression.     Assessment/Plan: Active Problems:   Intractable nausea and vomiting    Intractable nausea and vomiting Intractable abdominal/supra pubic pain Associated with chronic interstitial cystitis flareup Recent CT abdomen pelvis and recent CT renal stone done both in January 2025 all negative for any acute intra-abdominal process DG abdomen negative, reluctant to perform any further imaging as symptoms may be due to stress/anxiety/depression Repeat UA unremarkable, UC pending, stool culture pending Antiemetics, pain management Full liquid diet   Chronic interstitial cystitis Patient is scheduled for cystoscopy on 05/22/2023 with OSH urology Start Pyridium for bladder spasms Resume  pain regimen and bowel regimen   Hypothyroidism levothyroxine   Hypertension BP stable Continue home BP meds daily for now   Chronic anxiety/depression Resume home regimen   IBS Hold home Linzess  Reports adrenal insufficiency ?flare Cortisol level pending Continue hydrocortisone for now, may require stress dose   Obesity BMI 32 Recommend lifestyle modification       Estimated body mass index is 32.69 kg/m as calculated from the following:   Height as of this encounter: 5\' 11"  (1.803 m).   Weight as of this encounter: 106.3 kg.     Code Status: Full  Family Communication: None at bedside  Disposition Plan: Status is: Inpatient Remains inpatient appropriate because: Level of care      Consultants: None  Procedures: None  Antimicrobials: None  DVT prophylaxis: Lovenox   Objective: Vitals:   04/22/23 2202 04/23/23 0541 04/23/23 2204 04/24/23 0612  BP: 132/82 (!) 140/99 124/80 (!) 147/88  Pulse: 86 65 82 100  Resp: 14 14 18 18   Temp: 98 F (36.7 C) 98.1 F (36.7 C) 97.9 F (36.6 C) 98.6 F (37 C)  TempSrc: Oral Oral Oral Oral  SpO2: 97% 99% 94% 94%  Weight:      Height:        Intake/Output Summary (Last 24 hours) at 04/24/2023 1455 Last data filed at 04/24/2023 8119 Gross per 24 hour  Intake 2121.82 ml  Output 300 ml  Net 1821.82 ml   Filed Weights   04/22/23 0017 04/22/23 0436  Weight: 104.3 kg 106.3 kg    Exam: General: NAD  Cardiovascular: S1, S2 present Respiratory: CTAB Abdomen: Soft, tender, nondistended, bowel sounds present Musculoskeletal: No bilateral pedal edema noted, L hand amputated Skin: Noted post graft on RUE Psychiatry:  Normal mood     Data Reviewed: CBC: Recent Labs  Lab 04/21/23 1135 04/22/23 0601  WBC 8.3 6.3  HGB 15.0 14.1  HCT 45.4 43.6  MCV 95.0 97.3  PLT 326 290   Basic Metabolic Panel: Recent Labs  Lab 04/21/23 1135 04/22/23 0601 04/23/23 0616  NA 136 134* 134*  K 3.8 3.8 4.1  CL  99 97* 100  CO2 26 26 24   GLUCOSE 89 94 92  BUN 13 14 11   CREATININE 0.84 0.88 0.77  CALCIUM 9.7 9.0 8.8*  MG  --  1.9  --   PHOS  --  3.9  --    GFR: Estimated Creatinine Clearance: 133 mL/min (by C-G formula based on SCr of 0.77 mg/dL). Liver Function Tests: Recent Labs  Lab 04/21/23 1135 04/22/23 0601  AST 40 34  ALT 48* 47*  ALKPHOS 60 54  BILITOT 0.6 0.7  PROT 7.6 6.8  ALBUMIN 4.3 3.6   Recent Labs  Lab 04/21/23 1135  LIPASE 26   No results for input(s): "AMMONIA" in the last 168 hours. Coagulation Profile: No results for input(s): "INR", "PROTIME" in the last 168 hours. Cardiac Enzymes: No results for input(s): "CKTOTAL", "CKMB", "CKMBINDEX", "TROPONINI" in the last 168 hours. BNP (last 3 results) No results for input(s): "PROBNP" in the last 8760 hours. HbA1C: No results for input(s): "HGBA1C" in the last 72 hours. CBG: No results for input(s): "GLUCAP" in the last 168 hours. Lipid Profile: No results for input(s): "CHOL", "HDL", "LDLCALC", "TRIG", "CHOLHDL", "LDLDIRECT" in the last 72 hours. Thyroid Function Tests: No results for input(s): "TSH", "T4TOTAL", "FREET4", "T3FREE", "THYROIDAB" in the last 72 hours. Anemia Panel: No results for input(s): "VITAMINB12", "FOLATE", "FERRITIN", "TIBC", "IRON", "RETICCTPCT" in the last 72 hours. Urine analysis:    Component Value Date/Time   COLORURINE YELLOW 04/24/2023 1225   APPEARANCEUR HAZY (A) 04/24/2023 1225   APPEARANCEUR Clear 03/25/2023 1519   LABSPEC 1.010 04/24/2023 1225   PHURINE 7.0 04/24/2023 1225   GLUCOSEU NEGATIVE 04/24/2023 1225   HGBUR NEGATIVE 04/24/2023 1225   BILIRUBINUR NEGATIVE 04/24/2023 1225   BILIRUBINUR Negative 03/25/2023 1519   KETONESUR NEGATIVE 04/24/2023 1225   PROTEINUR NEGATIVE 04/24/2023 1225   NITRITE NEGATIVE 04/24/2023 1225   LEUKOCYTESUR TRACE (A) 04/24/2023 1225   Sepsis Labs: @LABRCNTIP (procalcitonin:4,lacticidven:4)  )No results found for this or any previous  visit (from the past 240 hours).    Studies: DG Abd Portable 1V Result Date: 04/24/2023 CLINICAL DATA:  Nausea and vomiting for 1 week. EXAM: PORTABLE ABDOMEN - 1 VIEW COMPARISON:  April 28, 2022. FINDINGS: The bowel gas pattern is normal. No radio-opaque calculi or other significant radiographic abnormality are seen. IMPRESSION: Negative. Electronically Signed   By: Lupita Raider M.D.   On: 04/24/2023 12:03    Scheduled Meds:  enoxaparin (LOVENOX) injection  40 mg Subcutaneous Q24H   FLUoxetine  40 mg Oral Daily   irbesartan  150 mg Oral Daily   And   hydrochlorothiazide  12.5 mg Oral Daily   hydrocortisone  10 mg Oral BID   levothyroxine  150 mcg Oral Q0600   OLANZapine zydis  7.5 mg Oral QHS   pantoprazole  40 mg Oral BID    Continuous Infusions:  promethazine (PHENERGAN) injection (IM or IVPB) 12.5 mg (04/24/23 0758)     LOS: 2 days     Briant Cedar, MD [facility]  If 7PM-7AM, please contact night-coverage [email or web address] 04/24/2023, 2:55 PM

## 2023-04-24 NOTE — Progress Notes (Signed)
 No new orders at this time, patient medicated for pain and nausea, will continue to monitor, patient in no acute distress

## 2023-04-24 NOTE — Progress Notes (Deleted)
 Patient received nerve block this shift, continued to receive prns for pain but was able to ambulate in hallway with assistance, encouraged to turn and change positions every 2 hours, she verbalized understanding and sat on the side of the bed for a while after ambulating, in bed resting, at this time, call light in reach

## 2023-04-25 LAB — URINE CULTURE

## 2023-04-25 LAB — BASIC METABOLIC PANEL WITH GFR
Anion gap: 11 (ref 5–15)
BUN: 11 mg/dL (ref 6–20)
CO2: 24 mmol/L (ref 22–32)
Calcium: 8.8 mg/dL — ABNORMAL LOW (ref 8.9–10.3)
Chloride: 102 mmol/L (ref 98–111)
Creatinine, Ser: 0.62 mg/dL (ref 0.44–1.00)
GFR, Estimated: >60 mL/min (ref >60)
Glucose, Bld: 91 mg/dL (ref 70–99)
Potassium: 4 mmol/L (ref 3.5–5.1)
Sodium: 137 mmol/L (ref 135–145)

## 2023-04-25 LAB — CBC
HCT: 40.5 % (ref 36.0–46.0)
Hemoglobin: 13.3 g/dL (ref 12.0–15.0)
MCH: 30.8 pg (ref 26.0–34.0)
MCHC: 32.8 g/dL (ref 30.0–36.0)
MCV: 93.8 fL (ref 80.0–100.0)
Platelets: 239 10*3/uL (ref 150–400)
RBC: 4.32 MIL/uL (ref 3.87–5.11)
RDW: 13.1 % (ref 11.5–15.5)
WBC: 7.2 10*3/uL (ref 4.0–10.5)
nRBC: 0 % (ref 0.0–0.2)

## 2023-04-25 LAB — GASTROINTESTINAL PANEL BY PCR, STOOL (REPLACES STOOL CULTURE)

## 2023-04-25 LAB — CORTISOL: Cortisol, Plasma: 2.6 ug/dL

## 2023-04-25 IMAGING — US US OB TRANSVAGINAL
1 series · 8 of 8 positions shown · non-contrast
Comparison: 03/08/2021

CLINICAL DATA: 30-year-old pregnant female-assess viability as
unable to Doppler heart tones clinically. Assigned gestational age
of 9 weeks 6 days by prior ultrasound.

EXAM:
TRANSVAGINAL OB ULTRASOUND
TECHNIQUE: Transvaginal ultrasound was performed for complete evaluation of the
gestation as well as the maternal uterus, adnexal regions, and
pelvic cul-de-sac.

[Series 1: us ob transvaginal · 8 acquisitions, 8 frames shown]
[im 1/8]
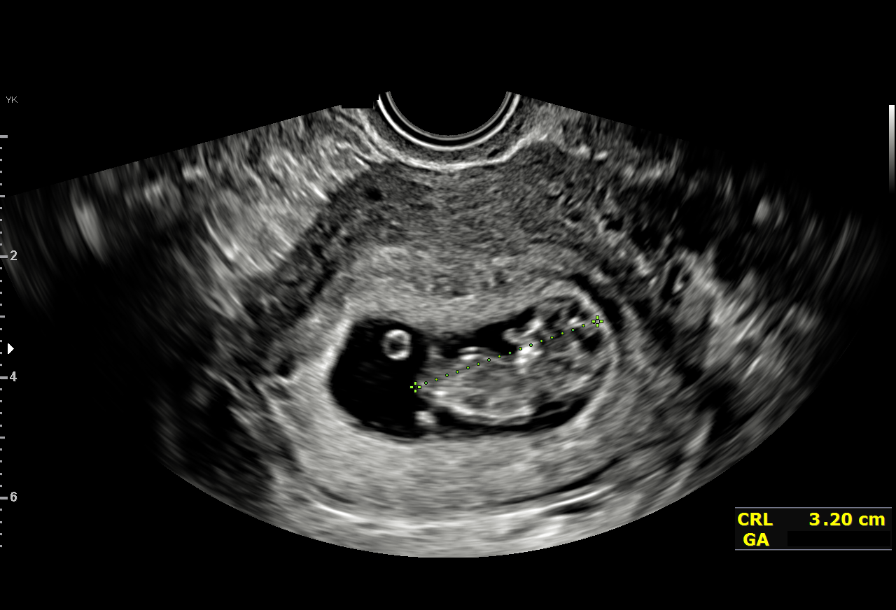
[im 2/8]
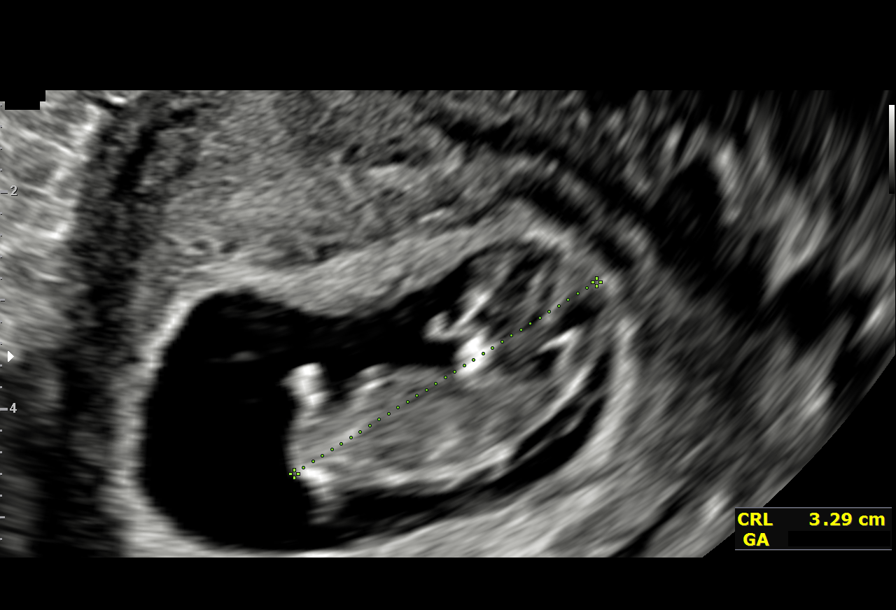
[im 3/8]
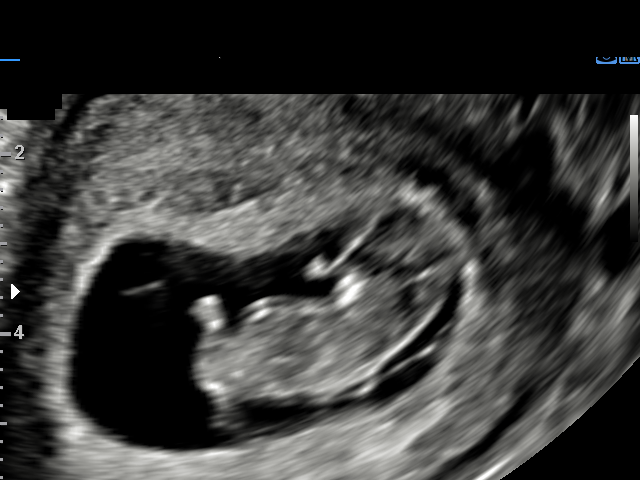
[im 4/8]
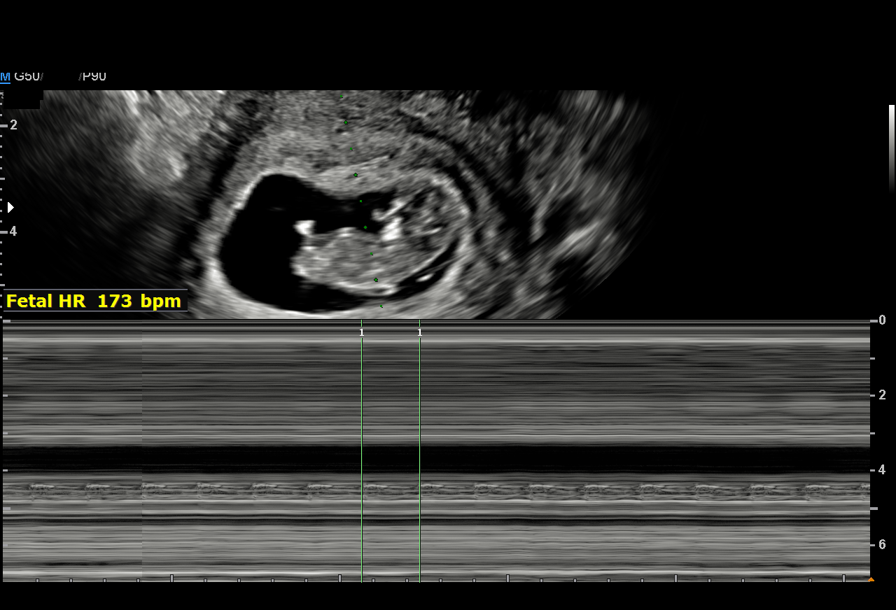
[im 5/8]
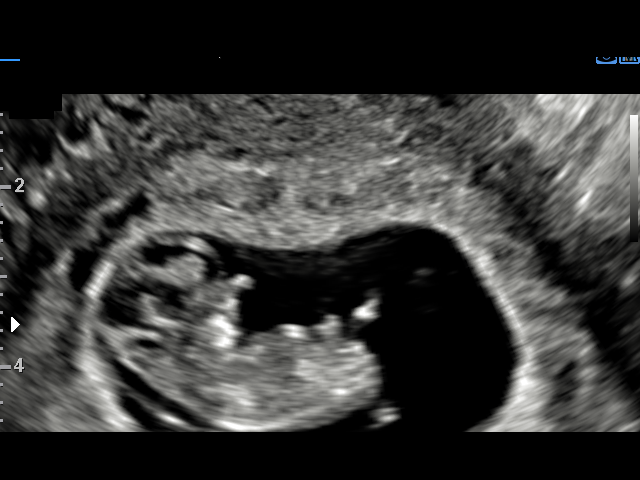
[im 6/8]
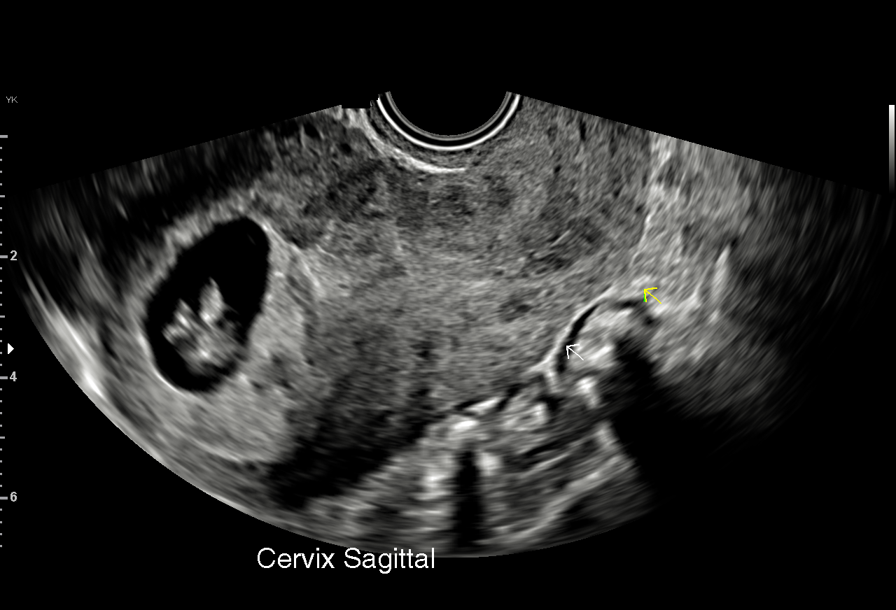
[im 7/8]
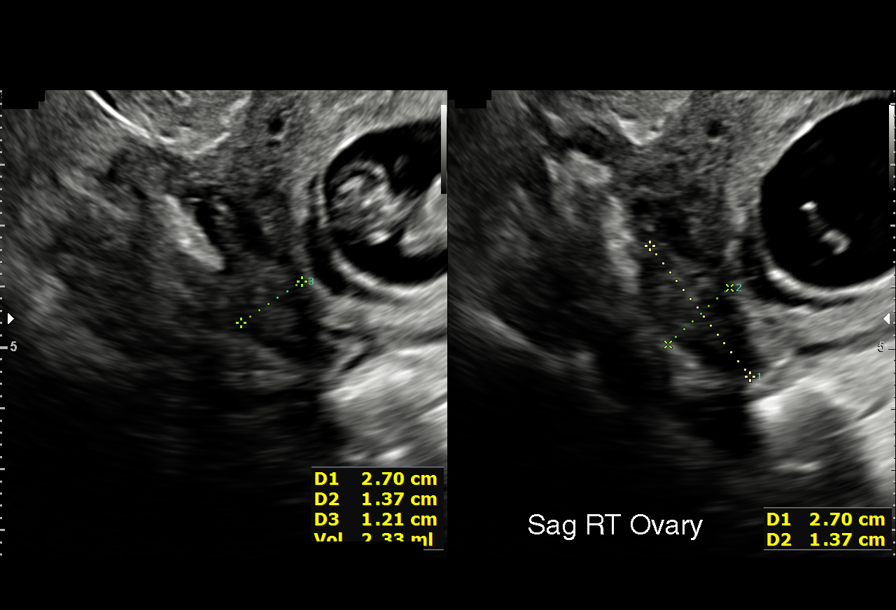
[im 8/8]
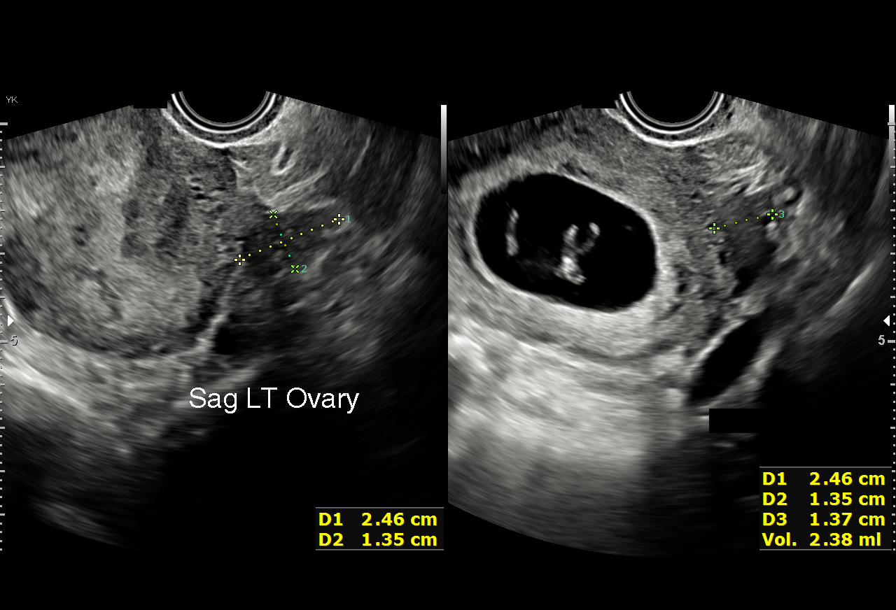

[8 of 8 positions shown; findings below may reference images not displayed]

FINDINGS: Intrauterine gestational sac: Single

Yolk sac:  Visualized.

Embryo:  Visualized.

Cardiac Activity: Visualized.

Heart Rate: 173 bpm

CRL:   32.4 mm   10 w 1 d

Subchorionic hemorrhage:  None visualized.

Maternal uterus/adnexae:

The ovaries bilaterally are unremarkable.

No evidence of free fluid or adnexal mass.
IMPRESSION: 1. Single living intrauterine gestation with assigned gestational
age of 9 weeks 6 days (by prior ultrasound).
2. No evidence of subchorionic hemorrhage.
3. Unremarkable ovaries.

## 2023-04-25 MED ORDER — HYDROCORTISONE SOD SUC (PF) 100 MG IJ SOLR
100.0000 mg | Freq: Three times a day (TID) | INTRAMUSCULAR | Status: AC
  Administered 2023-04-25 – 2023-04-27 (×5): 100 mg via INTRAVENOUS
  Filled 2023-04-25 (×6): qty 2

## 2023-04-25 MED ORDER — ORAL CARE MOUTH RINSE
15.0000 mL | OROMUCOSAL | Status: DC | PRN
Start: 2023-04-25 — End: 2023-04-27

## 2023-04-25 NOTE — Progress Notes (Signed)
 Requested IV Phenergan from pharmacy, advised patient I will give to her as soon as it arrives, verbalized understanding

## 2023-04-25 NOTE — Plan of Care (Signed)

## 2023-04-25 NOTE — Plan of Care (Signed)

## 2023-04-25 NOTE — Progress Notes (Signed)
 Messaged provider   patient c/o nausea this morning has not had any diarrhea would you like to give her anything also she has refused the pyridium stating it gives her migraines thanks  Awaiting response

## 2023-04-25 NOTE — Progress Notes (Signed)
 PROGRESS NOTE  Krystal [name] JSE:[id number] DOB: 1990-10-18 DOA: 04/21/2023 PCP: Patient, No Pcp Per  HPI/Recap of past 24 hours: Krystal [name] is a 33 y.o. female with medical history significant for chronic interstitial cystitis with plan for cystoscopy on 05/22/2023, chronic anxiety/depression, IBS, hypertension, who presents to the ER with complaints of bladder spasm x 2 days, associated with nausea and vomiting, prompting her to come to the ER for further evaluation.  States the pain is similar to prior interstitial cystitis flare which for her usually is induced by stress.  States she is currently having a stressful time in her life. In the ER, afebrile with no leukocytosis.  UA is negative for pyuria. VS and Lab work essentially unremarkable.  The patient received multiple rounds of IV opiate based analgesics with persistent lower abdominal pain.  EDP requested admission for further management and control of her pain.  Admitted by [facility], hospitalist service.     Today, patient continues to report nausea and vomiting, which seems to be her most complaint, now with weakness/lethargy.  Possibility of adrenal insufficiency playing a role Vs functional due to stress/anxiety/depression.     Assessment/Plan: Active Problems:   Intractable nausea and vomiting    Intractable nausea/vomiting/abdominal pain ??  Adrenal insufficiency flare (history of adrenal insufficiency) Vs chronic interstitial cystitis flareup Random plasma cortisol 2.6 Associated with chronic interstitial cystitis flareup  Recent CT abdomen pelvis and recent CT renal stone done both in January 2025 all negative for any acute intra-abdominal process DG abdomen negative Repeat UA unremarkable, UC showing multiple species, stool culture pending Will start stress dose IV hydrocortisone and monitor closely Hold home hydrocortisone Antiemetics, pain management Full liquid diet   Chronic interstitial  cystitis Patient is scheduled for cystoscopy on 05/22/2023 with OSH urology Patient refusing Pyridium for bladder spasms, reports migraine with it Resume pain regimen and bowel regimen   Hypothyroidism levothyroxine   Hypertension BP stable Continue home BP meds daily for now   Chronic anxiety/depression Resume home regimen   IBS Hold home Linzess   Obesity BMI 32 Recommend lifestyle modification       Estimated body mass index is 32.69 kg/m as calculated from the following:   Height as of this encounter: 5\' 11"  (1.803 m).   Weight as of this encounter: 106.3 kg.     Code Status: Full  Family Communication: None at bedside  Disposition Plan: Status is: Inpatient Remains inpatient appropriate because: Level of care      Consultants: None  Procedures: None  Antimicrobials: None  DVT prophylaxis: Lovenox   Objective: Vitals:   04/24/23 0612 04/24/23 1729 04/25/23 0421 04/25/23 1322  BP: (!) 147/88 (!) 147/110 133/82 (!) 138/94  Pulse: 100 75 79 85  Resp: 18 18 18 20   Temp: 98.6 F (37 C) 97.9 F (36.6 C) 98.8 F (37.1 C) 99.2 F (37.3 C)  TempSrc: Oral  Oral Oral  SpO2: 94% 99% 97% 97%  Weight:      Height:        Intake/Output Summary (Last 24 hours) at 04/25/2023 1729 Last data filed at 04/25/2023 0737 Gross per 24 hour  Intake 240 ml  Output 60 ml  Net 180 ml   Filed Weights   04/22/23 0017 04/22/23 0436  Weight: 104.3 kg 106.3 kg    Exam: General: NAD  Cardiovascular: S1, S2 present Respiratory: CTAB Abdomen: Soft, tender, nondistended, bowel sounds present Musculoskeletal: No bilateral pedal edema noted, L hand amputated Skin: Noted post  graft on RUE Psychiatry: Normal mood     Data Reviewed: CBC: Recent Labs  Lab 04/21/23 1135 04/22/23 0601 04/25/23 1045  WBC 8.3 6.3 7.2  HGB 15.0 14.1 13.3  HCT 45.4 43.6 40.5  MCV 95.0 97.3 93.8  PLT 326 290 239   Basic Metabolic Panel: Recent Labs  Lab 04/21/23 1135  04/22/23 0601 04/23/23 0616 04/25/23 0812  NA 136 134* 134* 137  K 3.8 3.8 4.1 4.0  CL 99 97* 100 102  CO2 26 26 24 24   GLUCOSE 89 94 92 91  BUN 13 14 11 11   CREATININE 0.84 0.88 0.77 0.62  CALCIUM 9.7 9.0 8.8* 8.8*  MG  --  1.9  --   --   PHOS  --  3.9  --   --    GFR: Estimated Creatinine Clearance: 133 mL/min (by C-G formula based on SCr of 0.62 mg/dL). Liver Function Tests: Recent Labs  Lab 04/21/23 1135 04/22/23 0601  AST 40 34  ALT 48* 47*  ALKPHOS 60 54  BILITOT 0.6 0.7  PROT 7.6 6.8  ALBUMIN 4.3 3.6   Recent Labs  Lab 04/21/23 1135  LIPASE 26   No results for input(s): "AMMONIA" in the last 168 hours. Coagulation Profile: No results for input(s): "INR", "PROTIME" in the last 168 hours. Cardiac Enzymes: No results for input(s): "CKTOTAL", "CKMB", "CKMBINDEX", "TROPONINI" in the last 168 hours. BNP (last 3 results) No results for input(s): "PROBNP" in the last 8760 hours. HbA1C: No results for input(s): "HGBA1C" in the last 72 hours. CBG: No results for input(s): "GLUCAP" in the last 168 hours. Lipid Profile: No results for input(s): "CHOL", "HDL", "LDLCALC", "TRIG", "CHOLHDL", "LDLDIRECT" in the last 72 hours. Thyroid Function Tests: No results for input(s): "TSH", "T4TOTAL", "FREET4", "T3FREE", "THYROIDAB" in the last 72 hours. Anemia Panel: No results for input(s): "VITAMINB12", "FOLATE", "FERRITIN", "TIBC", "IRON", "RETICCTPCT" in the last 72 hours. Urine analysis:    Component Value Date/Time   COLORURINE YELLOW 04/24/2023 1225   APPEARANCEUR HAZY (A) 04/24/2023 1225   APPEARANCEUR Clear 03/25/2023 1519   LABSPEC 1.010 04/24/2023 1225   PHURINE 7.0 04/24/2023 1225   GLUCOSEU NEGATIVE 04/24/2023 1225   HGBUR NEGATIVE 04/24/2023 1225   BILIRUBINUR NEGATIVE 04/24/2023 1225   BILIRUBINUR Negative 03/25/2023 1519   KETONESUR NEGATIVE 04/24/2023 1225   PROTEINUR NEGATIVE 04/24/2023 1225   NITRITE NEGATIVE 04/24/2023 1225   LEUKOCYTESUR TRACE (A)  04/24/2023 1225   Sepsis Labs: @LABRCNTIP (procalcitonin:4,lacticidven:4)  ) Recent Results (from the past 240 hours)  Urine Culture (for pregnant, neutropenic or urologic patients or patients with an indwelling urinary catheter)     Status: Abnormal   Collection Time: 04/24/23 12:25 PM   Specimen: Urine, Clean Catch  Result Value Ref Range Status   Specimen Description   Final    URINE, CLEAN CATCH Performed at [facility], [address] [place], [place] [address]    Special Requests   Final    NONE Performed at [facility], [address] [place], [place] [address]    Culture MULTIPLE SPECIES PRESENT, SUGGEST RECOLLECTION (A)  Final   Report Status 04/25/2023 FINAL  Final      Studies: No results found.   Scheduled Meds:  enoxaparin (LOVENOX) injection  40 mg Subcutaneous Q24H   FLUoxetine  40 mg Oral Daily   irbesartan  150 mg Oral Daily   And   hydrochlorothiazide  12.5 mg Oral Daily   hydrocortisone sod succinate (SOLU-CORTEF) inj  100 mg Intravenous Q8H   levothyroxine  150 mcg Oral Q0600   OLANZapine zydis  7.5 mg Oral QHS   pantoprazole  40 mg Oral BID    Continuous Infusions:  promethazine (PHENERGAN) injection (IM or IVPB) 12.5 mg (04/25/23 0909)     LOS: 3 days     Briant Cedar, MD [facility]  If 7PM-7AM, please contact night-coverage [email or web address] 04/25/2023, 5:29 PM

## 2023-04-25 NOTE — Progress Notes (Signed)
 No acute changes this shift, patient symptoms managed with scheduled meds and prn, also receiving scheduled  cortisone for low cortisol for a couple more days per MD for low value, otherwise patient reported vomiting x 1 this am which was less than 60 ml and clear but had drank multiple glasses of water and eating ice as well as getting a tray for all 3 meals this shift, she is able to ambulate down the hall and back and forth in the room to bathroom, take a shower. And perform other ADLs independently, in bed resting at this time, family at bedside, call light in reach

## 2023-04-26 MED ORDER — CLONAZEPAM 0.5 MG PO TABS
0.5000 mg | ORAL_TABLET | Freq: Three times a day (TID) | ORAL | Status: DC | PRN
  Administered 2023-04-26 – 2023-04-27 (×2): 0.5 mg via ORAL
  Filled 2023-04-26 (×2): qty 1

## 2023-04-26 MED ORDER — ENSURE ENLIVE PO LIQD
237.0000 mL | Freq: Two times a day (BID) | ORAL | Status: DC
  Administered 2023-04-27: 237 mL via ORAL

## 2023-04-26 NOTE — Progress Notes (Signed)
 See new orders. Patient in room now with family laughing and talking and deferred medication administration at this time, in no acute distress at this time, will continue to monitor, call light in reach

## 2023-04-26 NOTE — Plan of Care (Signed)

## 2023-04-26 NOTE — Progress Notes (Signed)
 Messaged provider  Patient  asked for more or another medication: 1st that she needs something else for anxiety , I have given her zyprexa and klonipin this shift 2nd she wants to get rid of Dilaudid although I've given it to her twice this shift and/or increase the Oxy which she has also had twice this shift or would you consider a psych consult.   She is having crying spells and her meds may need to be adjusted because I am giving her a prn every hour and now with her current regimen I cannot give her anything else until 1630  There has been no evidence of  N/V aeb patient was noted up this morning when staff came in @ 0700 and has been eating snacks supplied by family who visited yesterday. She is drinking plenty of water, soda, etc and walking back and forth to the to desk to get bags of ice and other snacks.  Please advise. Thanks   Awaiting response

## 2023-04-26 NOTE — Progress Notes (Signed)
 PROGRESS NOTE  Krystal [name] ZOX:[id number] DOB: [birth year] DOA: 04/21/2023 PCP: Patient, No Pcp Per  HPI/Recap of past 24 hours: Krystal [name] is a 33 y.o. female with medical history significant for chronic interstitial cystitis with plan for cystoscopy on 05/22/2023, chronic anxiety/depression, IBS, hypertension, who presents to the ER with complaints of bladder spasm x 2 days, associated with nausea and vomiting, prompting her to come to the ER for further evaluation.  States the pain is similar to prior interstitial cystitis flare which for her usually is induced by stress.  States she is currently having a stressful time in her life. In the ER, afebrile with no leukocytosis.  UA is negative for pyuria. VS and Lab work essentially unremarkable.  The patient received multiple rounds of IV opiate based analgesics with persistent lower abdominal pain.  EDP requested admission for further management and control of her pain.  Admitted by [facility], hospitalist service.     Today, patient reports abdominal pain has since improved as well as nausea/vomiting, improved lethargy. Possibility of adrenal insufficiency playing a role Vs functional due to stress/anxiety/depression.  Patient now complaining of anxiety with noted crying spells. ?Possibility of malingering.  Psych consultation, will see patient on 3/3.     Assessment/Plan: Active Problems:   Intractable nausea and vomiting    Intractable nausea/vomiting/abdominal pain ??  Adrenal insufficiency flare (history of adrenal insufficiency) Vs chronic interstitial cystitis flareup Random plasma cortisol 2.6 Associated with chronic interstitial cystitis flareup  Recent CT abdomen pelvis and recent CT renal stone done both in January 2025 all negative for any acute intra-abdominal process DG abdomen negative Repeat UA unremarkable, UC showing multiple species, stool culture negative Continue stress dose IV hydrocortisone X 2 days, plan  to discuss with outside endocrinology and monitor closely Hold home hydrocortisone Antiemetics, pain management Advance diet   Chronic interstitial cystitis Patient is scheduled for cystoscopy on 05/22/2023 with OSH urology Patient refusing Pyridium for bladder spasms, reports migraine with it Resume pain regimen and bowel regimen   Hypothyroidism levothyroxine   Hypertension BP stable Continue home BP meds daily for now   Chronic anxiety/depression Now with crying spells, questionable malingering Resume home regimen Psych consultation, will see patient on 3/3   IBS Hold home Linzess   Obesity BMI 32 Recommend lifestyle modification       Estimated body mass index is 32.69 kg/m as calculated from the following:   Height as of this encounter: 5\' 11"  (1.803 m).   Weight as of this encounter: 106.3 kg.     Code Status: Full  Family Communication: None at bedside  Disposition Plan: Status is: Inpatient Remains inpatient appropriate because: Level of care      Consultants: None  Procedures: None  Antimicrobials: None  DVT prophylaxis: Lovenox   Objective: Vitals:   04/25/23 1322 04/25/23 2041 04/26/23 0410 04/26/23 1318  BP: (!) 138/94 133/87 (!) 148/102 (!) 164/102  Pulse: 85 79 72 78  Resp: 20 15 13 16   Temp: 99.2 F (37.3 C) 98.3 F (36.8 C) 97.9 F (36.6 C) 98.3 F (36.8 C)  TempSrc: Oral Oral Oral Oral  SpO2: 97% 94% 98% 98%  Weight:      Height:        Intake/Output Summary (Last 24 hours) at 04/26/2023 1534 Last data filed at 04/26/2023 1000 Gross per 24 hour  Intake 2840 ml  Output 400 ml  Net 2440 ml   Filed Weights   04/22/23 0017 04/22/23 0436  Weight:  104.3 kg 106.3 kg    Exam: General: NAD  Cardiovascular: S1, S2 present Respiratory: CTAB Abdomen: Soft, tender, nondistended, bowel sounds present Musculoskeletal: No bilateral pedal edema noted, L hand amputated Skin: Noted post graft on RUE Psychiatry: Normal mood      Data Reviewed: CBC: Recent Labs  Lab 04/21/23 1135 04/22/23 0601 04/25/23 1045  WBC 8.3 6.3 7.2  HGB 15.0 14.1 13.3  HCT 45.4 43.6 40.5  MCV 95.0 97.3 93.8  PLT 326 290 239   Basic Metabolic Panel: Recent Labs  Lab 04/21/23 1135 04/22/23 0601 04/23/23 0616 04/25/23 0812  NA 136 134* 134* 137  K 3.8 3.8 4.1 4.0  CL 99 97* 100 102  CO2 26 26 24 24   GLUCOSE 89 94 92 91  BUN 13 14 11 11   CREATININE 0.84 0.88 0.77 0.62  CALCIUM 9.7 9.0 8.8* 8.8*  MG  --  1.9  --   --   PHOS  --  3.9  --   --    GFR: Estimated Creatinine Clearance: 133 mL/min (by C-G formula based on SCr of 0.62 mg/dL). Liver Function Tests: Recent Labs  Lab 04/21/23 1135 04/22/23 0601  AST 40 34  ALT 48* 47*  ALKPHOS 60 54  BILITOT 0.6 0.7  PROT 7.6 6.8  ALBUMIN 4.3 3.6   Recent Labs  Lab 04/21/23 1135  LIPASE 26   No results for input(s): "AMMONIA" in the last 168 hours. Coagulation Profile: No results for input(s): "INR", "PROTIME" in the last 168 hours. Cardiac Enzymes: No results for input(s): "CKTOTAL", "CKMB", "CKMBINDEX", "TROPONINI" in the last 168 hours. BNP (last 3 results) No results for input(s): "PROBNP" in the last 8760 hours. HbA1C: No results for input(s): "HGBA1C" in the last 72 hours. CBG: No results for input(s): "GLUCAP" in the last 168 hours. Lipid Profile: No results for input(s): "CHOL", "HDL", "LDLCALC", "TRIG", "CHOLHDL", "LDLDIRECT" in the last 72 hours. Thyroid Function Tests: No results for input(s): "TSH", "T4TOTAL", "FREET4", "T3FREE", "THYROIDAB" in the last 72 hours. Anemia Panel: No results for input(s): "VITAMINB12", "FOLATE", "FERRITIN", "TIBC", "IRON", "RETICCTPCT" in the last 72 hours. Urine analysis:    Component Value Date/Time   COLORURINE YELLOW 04/24/2023 1225   APPEARANCEUR HAZY (A) 04/24/2023 1225   APPEARANCEUR Clear 03/25/2023 1519   LABSPEC 1.010 04/24/2023 1225   PHURINE 7.0 04/24/2023 1225   GLUCOSEU NEGATIVE 04/24/2023 1225    HGBUR NEGATIVE 04/24/2023 1225   BILIRUBINUR NEGATIVE 04/24/2023 1225   BILIRUBINUR Negative 03/25/2023 1519   KETONESUR NEGATIVE 04/24/2023 1225   PROTEINUR NEGATIVE 04/24/2023 1225   NITRITE NEGATIVE 04/24/2023 1225   LEUKOCYTESUR TRACE (A) 04/24/2023 1225   Sepsis Labs: @LABRCNTIP (procalcitonin:4,lacticidven:4)  ) Recent Results (from the past 240 hours)  Gastrointestinal Panel by PCR , Stool     Status: None   Collection Time: 04/24/23 12:24 PM   Specimen: Urine, Clean Catch; Stool  Result Value Ref Range Status   Campylobacter species NOT DETECTED NOT DETECTED Final   Plesimonas shigelloides NOT DETECTED NOT DETECTED Final   Salmonella species NOT DETECTED NOT DETECTED Final   Yersinia enterocolitica NOT DETECTED NOT DETECTED Final   Vibrio species NOT DETECTED NOT DETECTED Final   Vibrio cholerae NOT DETECTED NOT DETECTED Final   Enteroaggregative E coli (EAEC) NOT DETECTED NOT DETECTED Final   Enteropathogenic E coli (EPEC) NOT DETECTED NOT DETECTED Final   Enterotoxigenic E coli (ETEC) NOT DETECTED NOT DETECTED Final   Shiga like toxin producing E coli (STEC) NOT DETECTED NOT DETECTED  Final   Shigella/Enteroinvasive E coli (EIEC) NOT DETECTED NOT DETECTED Final   Cryptosporidium NOT DETECTED NOT DETECTED Final   Cyclospora cayetanensis NOT DETECTED NOT DETECTED Final   Entamoeba histolytica NOT DETECTED NOT DETECTED Final   Giardia lamblia NOT DETECTED NOT DETECTED Final   Adenovirus F40/41 NOT DETECTED NOT DETECTED Final   Astrovirus NOT DETECTED NOT DETECTED Final   Norovirus GI/GII NOT DETECTED NOT DETECTED Final   Rotavirus A NOT DETECTED NOT DETECTED Final   Sapovirus (I, II, IV, and V) NOT DETECTED NOT DETECTED Final    Comment: Performed at [facility], [address], [place], [place] [address]  Urine Culture (for pregnant, neutropenic or urologic patients or patients with an indwelling urinary catheter)     Status: Abnormal   Collection Time:  04/24/23 12:25 PM   Specimen: Urine, Clean Catch  Result Value Ref Range Status   Specimen Description   Final    URINE, CLEAN CATCH Performed at [facility], [address], [place], [place] [address]    Special Requests   Final    NONE Performed at [facility], [address], [place], [place] [address]    Culture MULTIPLE SPECIES PRESENT, SUGGEST RECOLLECTION (A)  Final   Report Status 04/25/2023 FINAL  Final      Studies: No results found.   Scheduled Meds:  enoxaparin (LOVENOX) injection  40 mg Subcutaneous Q24H   feeding supplement  237 mL Oral BID BM   FLUoxetine  40 mg Oral Daily   irbesartan  150 mg Oral Daily   And   hydrochlorothiazide  12.5 mg Oral Daily   hydrocortisone sod succinate (SOLU-CORTEF) inj  100 mg Intravenous Q8H   levothyroxine  150 mcg Oral Q0600   OLANZapine zydis  7.5 mg Oral QHS   pantoprazole  40 mg Oral BID    Continuous Infusions:  promethazine (PHENERGAN) injection (IM or IVPB) 12.5 mg (04/25/23 1810)     LOS: 4 days     Briant Cedar, MD [facility]  If 7PM-7AM, please contact night-coverage [email or web address] 04/26/2023, 3:34 PM

## 2023-04-26 NOTE — Plan of Care (Signed)
  Problem: Education: Goal: Knowledge of General Education information will improve Description: Including pain rating scale, medication(s)/side effects and non-pharmacologic comfort measures 04/26/2023 1905 by Morley Kos, RN Outcome: Progressing 04/26/2023 1904 by Morley Kos, RN Outcome: Progressing   Problem: Health Behavior/Discharge Planning: Goal: Ability to manage health-related needs will improve 04/26/2023 1905 by Morley Kos, RN Outcome: Progressing 04/26/2023 1904 by Morley Kos, RN Outcome: Progressing   Problem: Clinical Measurements: Goal: Ability to maintain clinical measurements within normal limits will improve 04/26/2023 1905 by Morley Kos, RN Outcome: Progressing 04/26/2023 1904 by Morley Kos, RN Outcome: Progressing Goal: Will remain free from infection 04/26/2023 1905 by Morley Kos, RN Outcome: Progressing 04/26/2023 1904 by Morley Kos, RN Outcome: Progressing Goal: Diagnostic test results will improve 04/26/2023 1905 by Morley Kos, RN Outcome: Progressing 04/26/2023 1904 by Morley Kos, RN Outcome: Progressing Goal: Respiratory complications will improve 04/26/2023 1905 by Morley Kos, RN Outcome: Progressing 04/26/2023 1904 by Morley Kos, RN Outcome: Progressing Goal: Cardiovascular complication will be avoided 04/26/2023 1905 by Morley Kos, RN Outcome: Progressing 04/26/2023 1904 by Morley Kos, RN Outcome: Progressing   Problem: Activity: Goal: Risk for activity intolerance will decrease 04/26/2023 1905 by Morley Kos, RN Outcome: Progressing 04/26/2023 1904 by Morley Kos, RN Outcome: Progressing   Problem: Nutrition: Goal: Adequate nutrition will be maintained 04/26/2023 1905 by Morley Kos, RN Outcome: Progressing 04/26/2023 1904 by Morley Kos, RN Outcome: Progressing   Problem: Coping: Goal: Level of anxiety will decrease 04/26/2023 1905 by Morley Kos, RN Outcome:  Progressing 04/26/2023 1904 by Morley Kos, RN Outcome: Progressing   Problem: Elimination: Goal: Will not experience complications related to bowel motility 04/26/2023 1905 by Morley Kos, RN Outcome: Progressing 04/26/2023 1904 by Morley Kos, RN Outcome: Progressing Goal: Will not experience complications related to urinary retention 04/26/2023 1905 by Morley Kos, RN Outcome: Progressing 04/26/2023 1904 by Morley Kos, RN Outcome: Progressing   Problem: Pain Managment: Goal: General experience of comfort will improve and/or be controlled 04/26/2023 1905 by Morley Kos, RN Outcome: Progressing 04/26/2023 1904 by Morley Kos, RN Outcome: Progressing   Problem: Safety: Goal: Ability to remain free from injury will improve 04/26/2023 1905 by Morley Kos, RN Outcome: Progressing 04/26/2023 1904 by Morley Kos, RN Outcome: Progressing   Problem: Skin Integrity: Goal: Risk for impaired skin integrity will decrease 04/26/2023 1905 by Morley Kos, RN Outcome: Progressing 04/26/2023 1904 by Morley Kos, RN Outcome: Progressing

## 2023-04-26 NOTE — Progress Notes (Signed)
 Patient repotted vomiting but disposed of it before this writer came to room, requested pain medication and was told that she could be given phenergan at 6:30, and that if she felt ok to take a small pill and drink then it may have been reflux, or positioning so be sure to sit all the way up when eating or drinking because patient sometimes lies down while eating and drinking, currently in bed resting, in  no acute distress, call light in reach , will continue to monitor

## 2023-04-27 ENCOUNTER — Other Ambulatory Visit (HOSPITAL_COMMUNITY): Payer: Self-pay

## 2023-04-27 ENCOUNTER — Encounter (HOSPITAL_COMMUNITY): Payer: Self-pay

## 2023-04-27 DIAGNOSIS — F603 Borderline personality disorder: Secondary | ICD-10-CM

## 2023-04-27 DIAGNOSIS — F331 Major depressive disorder, recurrent, moderate: Secondary | ICD-10-CM

## 2023-04-27 MED ORDER — OLANZAPINE 2.5 MG PO TABS
2.5000 mg | ORAL_TABLET | Freq: Two times a day (BID) | ORAL | 0 refills | Status: DC | PRN
  Filled 2023-04-27: qty 30, 15d supply, fill #0

## 2023-04-27 MED ORDER — OXYCODONE HCL 5 MG PO TABS
5.0000 mg | ORAL_TABLET | Freq: Four times a day (QID) | ORAL | 0 refills | Status: AC | PRN
  Filled 2023-04-27: qty 20, 5d supply, fill #0

## 2023-04-27 MED ORDER — HYDROCORTISONE 10 MG PO TABS
30.0000 mg | ORAL_TABLET | Freq: Every morning | ORAL | 0 refills | Status: AC
  Filled 2023-04-27: qty 90, 30d supply, fill #0

## 2023-04-27 MED ORDER — TRAMADOL HCL 50 MG PO TABS
50.0000 mg | ORAL_TABLET | Freq: Two times a day (BID) | ORAL | 0 refills | Status: AC | PRN
  Filled 2023-04-27: qty 30, 15d supply, fill #0

## 2023-04-27 MED ORDER — HYDROCORTISONE 10 MG PO TABS
10.0000 mg | ORAL_TABLET | Freq: Every evening | ORAL | 0 refills | Status: AC
  Filled 2023-04-27: qty 30, 30d supply, fill #0

## 2023-04-27 MED ORDER — OLANZAPINE 15 MG PO TBDP
7.5000 mg | ORAL_TABLET | Freq: Every day | ORAL | 0 refills | Status: DC
  Filled 2023-04-27: qty 15, 30d supply, fill #0

## 2023-04-27 MED ORDER — FLUOXETINE HCL 20 MG PO CAPS
60.0000 mg | ORAL_CAPSULE | Freq: Every day | ORAL | 0 refills | Status: DC
  Filled 2023-04-27: qty 90, 30d supply, fill #0

## 2023-04-27 MED ORDER — HYDROCORTISONE SOD SUC (PF) 100 MG IJ SOLR
50.0000 mg | Freq: Two times a day (BID) | INTRAMUSCULAR | Status: DC
  Filled 2023-04-27: qty 1

## 2023-04-27 MED ORDER — PROMETHAZINE HCL 25 MG RE SUPP
25.0000 mg | Freq: Four times a day (QID) | RECTAL | 0 refills | Status: DC | PRN
  Filled 2023-04-27: qty 30, 8d supply, fill #0

## 2023-04-27 MED ORDER — CLONAZEPAM 1 MG PO TABS: 1.0000 mg | ORAL_TABLET | Freq: Two times a day (BID) | ORAL | Status: DC | PRN

## 2023-04-27 NOTE — Discharge Summary (Addendum)
 Physician Discharge Summary   Patient: Krystal [name] MRN: [id number] DOB: 08-Apr-1990  Admit date:     04/21/2023  Discharge date: 04/27/23  Discharge Physician: Briant Cedar   PCP: Patient, No Pcp Per   Recommendations at discharge:   Follow-up with PCP Follow-up with psychiatry/psychologist Follow-up with Dr. Sharl Ma with Endocrinology  Discharge Diagnoses: Active Problems:   Intractable nausea and vomiting    Hospital Course: Krystal [name] is a 33 y.o. female with medical history significant for chronic interstitial cystitis with plan for cystoscopy on 05/22/2023, chronic anxiety/depression, IBS, hypertension, who presents to the ER with complaints of bladder spasm x 2 days, associated with nausea and vomiting, prompting her to come to the ER for further evaluation.  States the pain is similar to prior interstitial cystitis flare which for her usually is induced by stress.  States she is currently having a stressful time in her life. In the ER, afebrile with no leukocytosis.  UA is negative for pyuria. VS and Lab work essentially unremarkable.  The patient received multiple rounds of IV opiate based analgesics with persistent lower abdominal pain.  EDP requested admission for further management and control of her pain.  Admitted by [facility], hospitalist service.     Today, patient reports some mild abdominal pain, denies any further nausea/vomiting, able to tolerate diet. Reported having borderline personality disorder, which sometimes results to her struggling with her emotions.  Psychiatry consulted, patient appreciated.  Referral sent to Dr. Sharl Ma with endocrinology for outpatient follow-up.  Patient eager to see endocrinology.  Patient also has a PCP of which she will make a follow-up appointment.  All medications sent to [facility] pharmacy to be brought at bedside for patient.    Assessment and Plan:  Intractable nausea/vomiting/abdominal pain ??  Adrenal insufficiency flare  (history of adrenal insufficiency) Vs chronic interstitial cystitis flareup Random plasma cortisol 2.6 (but was on PO hydrocortisone) Associated with chronic interstitial cystitis flareup  Recent CT abdomen pelvis and recent CT renal stone done both in January 2025 all negative for any acute intra-abdominal process DG abdomen negative Repeat UA unremarkable, UC showing multiple species, stool culture negative S/p stress dose IV hydrocortisone X 2 days, will discharge on PO hydrocortisone 40 mg total (30 mg in am and 10 mg in pm) as recommended by curbside endocrinologist Outpatient endocrinology follow up Consideration of cannabinoid hyperemesis syndrome (marijuana positive on UDS) Antiemetics, pain management   Chronic interstitial cystitis Patient is scheduled for cystoscopy on 05/22/2023 with OSH urology Patient refusing Pyridium for bladder spasms, reports migraine with it Resume pain regimen and bowel regimen   Hypothyroidism levothyroxine   Hypertension BP stable Continue home BP   Chronic anxiety/depression Borderline personality disorder  Now with crying spells Psych consulted, appreciate recs Increase Prozac to 60 mg daily, continue Zyprexa and follow-up with behavioral therapy   IBS Linzess   Obesity BMI 32 Recommend lifestyle modification        Pain control -  Controlled Substance Reporting System database was reviewed. and patient was instructed, not to drive, operate heavy machinery, perform activities at heights, swimming or participation in water activities or provide baby-sitting services while on Pain, Sleep and Anxiety Medications; until their outpatient Physician has advised to do so again. Also recommended to not to take more than prescribed Pain, Sleep and Anxiety Medications.    Consultants: Psychiatry Procedures performed: None Disposition: Home Diet recommendation:  Cardiac diet   DISCHARGE MEDICATION: Allergies as of 04/27/2023  Reactions   Bee Venom Anaphylaxis, Swelling   Swelling of face, throat. Airway restriction.   Coreg [carvedilol] Shortness Of Breath, Other (See Comments)   Triggers asthma   Red Dye #40 (allura Red) Other (See Comments)   Migraines   Compazine [prochlorperazine Maleate] Other (See Comments)   Twitching Able to tolerate promethazine   Oxycodone Nausea And Vomiting   Not allergic per pt.    Vicodin [hydrocodone-acetaminophen] Itching, Nausea And Vomiting, Other (See Comments)   OK with Tylenol   Cymbalta [duloxetine Hcl] Other (See Comments)   Irritability  Syncope    Ditropan [oxybutynin] Other (See Comments)   Severe dry mouth   Elavil [amitriptyline] Other (See Comments)   Irritability    Motrin [ibuprofen] Other (See Comments)   Heartburn    Naprosyn [naproxen] Other (See Comments)   Heartburn   Pyrimethamine Other (See Comments)   From Fansidar combination drug Triggers asthma   Sulfadoxine Other (See Comments)   From Fansidar combination drug Triggers asthma    Zestril [lisinopril] Cough   Nifedical Xl [nifedipine] Nausea And Vomiting   Reglan [metoclopramide] Swelling, Other (See Comments)   Restlessness Able to take with Benadryl   Trandate [labetalol] Nausea And Vomiting, Other (See Comments)   Triggers asthma         Medication List     STOP taking these medications    oxybutynin 5 MG 24 hr tablet Commonly known as: DITROPAN-XL   Symbicort 80-4.5 MCG/ACT inhaler Generic drug: budesonide-formoterol       TAKE these medications    acetaminophen 500 MG tablet Commonly known as: TYLENOL Take 1,000 mg by mouth 3 (three) times daily as needed for moderate pain (pain score 4-6), headache, fever or mild pain (pain score 1-3).   albuterol 108 (90 Base) MCG/ACT inhaler Commonly known as: Ventolin HFA Inhale 2 puffs into the lungs every 4 (four) hours as needed for wheezing or shortness of breath. What changed: when to take this   BD Disp  Needles 18G X 1-1/2" Misc Generic drug: NEEDLE (DISP) 18 G Use as directed with daily bladder instillations   bupivacaine(PF) 0.5 % Soln injection Commonly known as: MARCAINE Instill 15 mLs into bladder daily with heparin.   clonazePAM 1 MG tablet Commonly known as: KLONOPIN Take 1 tablet (1 mg total) by mouth 2 (two) times daily as needed for anxiety.   dicyclomine 20 MG tablet Commonly known as: BENTYL Take 1 tablet (20 mg total) by mouth 2 (two) times daily.   EPINEPHrine 0.3 mg/0.3 mL Soaj injection Commonly known as: EPI-PEN Inject 0.3 mg into the muscle as needed for anaphylaxis.   FLUoxetine 20 MG capsule Commonly known as: PROZAC Take 3 capsules (60 mg total) by mouth daily. Start taking on: April 28, 2023 What changed:  medication strength how much to take additional instructions   heparin 78295 UNIT/ML injection Instill 4 mLs (40,000 Units total) into the bladder daily with bupivacaine.   hydrocortisone 10 MG tablet Commonly known as: CORTEF Take 3 tablets (30 mg total) by mouth every morning. IM program What changed:  how much to take when to take this   hydrocortisone 10 MG tablet Commonly known as: Cortef Take 1 tablet (10 mg total) by mouth every evening. What changed: You were already taking a medication with the same name, and this prescription was added. Make sure you understand how and when to take each.   irbesartan-hydrochlorothiazide 150-12.5 MG tablet Commonly known as: AVALIDE Take 2 tablets by mouth in  the morning and at bedtime.   levothyroxine 150 MCG tablet Commonly known as: SYNTHROID Take 1 tablet (150 mcg total) by mouth daily at 6 (six) AM.   linaclotide 145 MCG Caps capsule Commonly known as: LINZESS Take 1 capsule (145 mcg total) by mouth daily before breakfast.   OLANZapine zydis 15 MG disintegrating tablet Commonly known as: ZYPREXA Take 0.5 tablets (7.5 mg total) by mouth at bedtime.   OLANZapine 2.5 MG tablet Commonly  known as: ZYPREXA Take 1 tablet (2.5 mg total) by mouth 2 (two) times daily as needed for up to 30 doses (nausea, intractible anxiety).   ondansetron 4 MG disintegrating tablet Commonly known as: ZOFRAN-ODT Take 4 mg by mouth 2 (two) times daily as needed for nausea or vomiting. What changed: Another medication with the same name was removed. Continue taking this medication, and follow the directions you see here.   oxyCODONE 5 MG immediate release tablet Commonly known as: Oxy IR/ROXICODONE Take 1 tablet (5 mg total) by mouth every 6 (six) hours as needed for up to 5 days for breakthrough pain or severe pain (pain score 7-10). What changed:  when to take this reasons to take this   pantoprazole 40 MG tablet Commonly known as: PROTONIX Take 1 tablet (40 mg total) by mouth 2 (two) times daily. What changed: when to take this   promethazine 25 MG suppository Commonly known as: PHENERGAN Place 1 suppository (25 mg total) rectally every 6 (six) hours as needed for nausea or vomiting. What changed: when to take this   Syringe (Disposable) 20 ML Misc Use as directed for daily bladder instillations   traMADol 50 MG tablet Commonly known as: ULTRAM Take 1 tablet (50 mg total) by mouth every 12 (twelve) hours as needed for moderate pain (pain score 4-6). What changed: when to take this        Follow-up Information     Talmage Coin, MD. Schedule an appointment as soon as possible for a visit.   Specialty: Endocrinology Contact information: [address] [place] [address] [phone]                Discharge Exam: Ceasar Mons Weights   04/22/23 0017 04/22/23 0436  Weight: 104.3 kg 106.3 kg   General: NAD  Cardiovascular: S1, S2 present Respiratory: CTAB Abdomen: Soft, nontender, nondistended, bowel sounds present Musculoskeletal: No bilateral pedal edema noted Skin: Normal Psychiatry: Normal mood   Condition at discharge: stable  The results of  significant diagnostics from this hospitalization (including imaging, microbiology, ancillary and laboratory) are listed below for reference.   Imaging Studies: DG Abd Portable 1V Result Date: 04/24/2023 CLINICAL DATA:  Nausea and vomiting for 1 week. EXAM: PORTABLE ABDOMEN - 1 VIEW COMPARISON:  April 28, 2022. FINDINGS: The bowel gas pattern is normal. No radio-opaque calculi or other significant radiographic abnormality are seen. IMPRESSION: Negative. Electronically Signed   By: Lupita Raider M.D.   On: 04/24/2023 12:03   US Pelvis Complete Result Date: 03/29/2023 CLINICAL DATA:  33 year old female presenting with suprapubic pain and vaginal discharge. EXAM: TRANSABDOMINAL AND TRANSVAGINAL ULTRASOUND OF PELVIS DOPPLER ULTRASOUND OF OVARIES TECHNIQUE: Both transabdominal and transvaginal ultrasound examinations of the pelvis were performed. Transabdominal technique was performed for global imaging of the pelvis including uterus, ovaries, adnexal regions, and pelvic cul-de-sac. It was necessary to proceed with endovaginal exam following the transabdominal exam to visualize the ovaries. Color and duplex Doppler ultrasound was utilized to evaluate blood flow to the ovaries.  COMPARISON:  Pelvic ultrasound 03/12/2023. FINDINGS: Uterus Measurements: 8.6 x 4.1 x 5.7 cm = volume: 105.8 mL. No fibroids or other mass visualized. Endometrium Thickness: 7.0 mm.  No focal abnormality visualized. Right ovary Measurements: 3.3 x 2.4 x 2.4 cm = volume: 10.0 mL. Normal appearance/no adnexal mass. Left ovary Measurements: 2.6 x 1.6 x 1.3 cm = volume: 2.7 mL. Normal appearance/no adnexal mass. Pulsed Doppler evaluation of both ovaries demonstrates normal low-resistance arterial and venous waveforms. Other findings No abnormal free fluid. IMPRESSION: 1. Normal pelvic ultrasound. Electronically Signed   By: Trudie Reed M.D.   On: 03/29/2023 08:29   US Transvaginal Non-OB Result Date: 03/29/2023 CLINICAL DATA:  33 year old  female presenting with suprapubic pain and vaginal discharge. EXAM: TRANSABDOMINAL AND TRANSVAGINAL ULTRASOUND OF PELVIS DOPPLER ULTRASOUND OF OVARIES TECHNIQUE: Both transabdominal and transvaginal ultrasound examinations of the pelvis were performed. Transabdominal technique was performed for global imaging of the pelvis including uterus, ovaries, adnexal regions, and pelvic cul-de-sac. It was necessary to proceed with endovaginal exam following the transabdominal exam to visualize the ovaries. Color and duplex Doppler ultrasound was utilized to evaluate blood flow to the ovaries. COMPARISON:  Pelvic ultrasound 03/12/2023. FINDINGS: Uterus Measurements: 8.6 x 4.1 x 5.7 cm = volume: 105.8 mL. No fibroids or other mass visualized. Endometrium Thickness: 7.0 mm.  No focal abnormality visualized. Right ovary Measurements: 3.3 x 2.4 x 2.4 cm = volume: 10.0 mL. Normal appearance/no adnexal mass. Left ovary Measurements: 2.6 x 1.6 x 1.3 cm = volume: 2.7 mL. Normal appearance/no adnexal mass. Pulsed Doppler evaluation of both ovaries demonstrates normal low-resistance arterial and venous waveforms. Other findings No abnormal free fluid. IMPRESSION: 1. Normal pelvic ultrasound. Electronically Signed   By: Trudie Reed M.D.   On: 03/29/2023 08:29   [place] Art/Ven Flow Abd Pelv Doppler Result Date: 03/29/2023 CLINICAL DATA:  33 year old female presenting with suprapubic pain and vaginal discharge. EXAM: TRANSABDOMINAL AND TRANSVAGINAL ULTRASOUND OF PELVIS DOPPLER ULTRASOUND OF OVARIES TECHNIQUE: Both transabdominal and transvaginal ultrasound examinations of the pelvis were performed. Transabdominal technique was performed for global imaging of the pelvis including uterus, ovaries, adnexal regions, and pelvic cul-de-sac. It was necessary to proceed with endovaginal exam following the transabdominal exam to visualize the ovaries. Color and duplex Doppler ultrasound was utilized to evaluate blood flow to the ovaries.  COMPARISON:  Pelvic ultrasound 03/12/2023. FINDINGS: Uterus Measurements: 8.6 x 4.1 x 5.7 cm = volume: 105.8 mL. No fibroids or other mass visualized. Endometrium Thickness: 7.0 mm.  No focal abnormality visualized. Right ovary Measurements: 3.3 x 2.4 x 2.4 cm = volume: 10.0 mL. Normal appearance/no adnexal mass. Left ovary Measurements: 2.6 x 1.6 x 1.3 cm = volume: 2.7 mL. Normal appearance/no adnexal mass. Pulsed Doppler evaluation of both ovaries demonstrates normal low-resistance arterial and venous waveforms. Other findings No abnormal free fluid. IMPRESSION: 1. Normal pelvic ultrasound. Electronically Signed   By: Trudie Reed M.D.   On: 03/29/2023 08:29    Microbiology: Results for orders placed or performed during the hospital encounter of 04/21/23  Gastrointestinal Panel by PCR , Stool     Status: None   Collection Time: 04/24/23 12:24 PM   Specimen: Urine, Clean Catch; Stool  Result Value Ref Range Status   Campylobacter species NOT DETECTED NOT DETECTED Final   Plesimonas shigelloides NOT DETECTED NOT DETECTED Final   Salmonella species NOT DETECTED NOT DETECTED Final   Yersinia enterocolitica NOT DETECTED NOT DETECTED Final   Vibrio species NOT DETECTED NOT DETECTED Final   Vibrio cholerae  NOT DETECTED NOT DETECTED Final   Enteroaggregative E coli (EAEC) NOT DETECTED NOT DETECTED Final   Enteropathogenic E coli (EPEC) NOT DETECTED NOT DETECTED Final   Enterotoxigenic E coli (ETEC) NOT DETECTED NOT DETECTED Final   Shiga like toxin producing E coli (STEC) NOT DETECTED NOT DETECTED Final   Shigella/Enteroinvasive E coli (EIEC) NOT DETECTED NOT DETECTED Final   Cryptosporidium NOT DETECTED NOT DETECTED Final   Cyclospora cayetanensis NOT DETECTED NOT DETECTED Final   Entamoeba histolytica NOT DETECTED NOT DETECTED Final   Giardia lamblia NOT DETECTED NOT DETECTED Final   Adenovirus F40/41 NOT DETECTED NOT DETECTED Final   Astrovirus NOT DETECTED NOT DETECTED Final   Norovirus  GI/GII NOT DETECTED NOT DETECTED Final   Rotavirus A NOT DETECTED NOT DETECTED Final   Sapovirus (I, II, IV, and V) NOT DETECTED NOT DETECTED Final    Comment: Performed at [facility], [address], [place], [place] [address]  Urine Culture (for pregnant, neutropenic or urologic patients or patients with an indwelling urinary catheter)     Status: Abnormal   Collection Time: 04/24/23 12:25 PM   Specimen: Urine, Clean Catch  Result Value Ref Range Status   Specimen Description   Final    URINE, CLEAN CATCH Performed at [facility], [address] [place], [place] [address]    Special Requests   Final    NONE Performed at [facility], [address], [place], [place] [address]    Culture MULTIPLE SPECIES PRESENT, SUGGEST RECOLLECTION (A)  Final   Report Status 04/25/2023 FINAL  Final    Labs: CBC: Recent Labs  Lab 04/21/23 1135 04/22/23 0601 04/25/23 1045  WBC 8.3 6.3 7.2  HGB 15.0 14.1 13.3  HCT 45.4 43.6 40.5  MCV 95.0 97.3 93.8  PLT 326 290 239   Basic Metabolic Panel: Recent Labs  Lab 04/21/23 1135 04/22/23 0601 04/23/23 0616 04/25/23 0812  NA 136 134* 134* 137  K 3.8 3.8 4.1 4.0  CL 99 97* 100 102  CO2 26 26 24 24   GLUCOSE 89 94 92 91  BUN 13 14 11 11   CREATININE 0.84 0.88 0.77 0.62  CALCIUM 9.7 9.0 8.8* 8.8*  MG  --  1.9  --   --   PHOS  --  3.9  --   --    Liver Function Tests: Recent Labs  Lab 04/21/23 1135 04/22/23 0601  AST 40 34  ALT 48* 47*  ALKPHOS 60 54  BILITOT 0.6 0.7  PROT 7.6 6.8  ALBUMIN 4.3 3.6   CBG: No results for input(s): "GLUCAP" in the last 168 hours.  Discharge time spent: greater than 30 minutes.  Signed: Briant Cedar, MD [facility] 04/27/2023

## 2023-04-27 NOTE — Progress Notes (Signed)
 TOC meds in a secure bag delivered to pt in room by this RN. Pt states she will pick up the olanzapine tomorrow at the [facility]- it will be available tomorrow. Pt also following up w/ [facility] pharmacy to see if meds ready for pick up can be filled via [facility] program- Secure chat sent to [facility]
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
## 2023-04-28 ENCOUNTER — Other Ambulatory Visit (HOSPITAL_COMMUNITY): Payer: Self-pay

## 2023-05-15 ENCOUNTER — Ambulatory Visit (HOSPITAL_COMMUNITY)
Admission: EM | Admit: 2023-05-15 | Discharge: 2023-05-15 | Disposition: A | Discharge status: Home / Self Care | Attending: Psychiatry | Admitting: Psychiatry

## 2023-05-15 ENCOUNTER — Other Ambulatory Visit (HOSPITAL_COMMUNITY): Payer: Self-pay

## 2023-05-15 DIAGNOSIS — F411 Generalized anxiety disorder: Secondary | ICD-10-CM | POA: Insufficient documentation

## 2023-05-15 DIAGNOSIS — F4310 Post-traumatic stress disorder, unspecified: Secondary | ICD-10-CM | POA: Insufficient documentation

## 2023-05-15 DIAGNOSIS — F603 Borderline personality disorder: Secondary | ICD-10-CM | POA: Insufficient documentation

## 2023-05-15 MED ORDER — OLANZAPINE 2.5 MG PO TABS
2.5000 mg | ORAL_TABLET | Freq: Every day | ORAL | 0 refills | Status: DC
Start: 2023-05-15 — End: 2023-06-30

## 2023-05-15 MED ORDER — FLUOXETINE HCL 20 MG PO CAPS: 60.0000 mg | ORAL_CAPSULE | Freq: Every day | ORAL | 0 refills | Status: DC

## 2023-05-15 MED ORDER — OLANZAPINE 7.5 MG PO TABS: 7.5000 mg | ORAL_TABLET | Freq: Every day | ORAL | 0 refills | Status: DC

## 2023-05-15 MED ORDER — OLANZAPINE 2.5 MG PO TABS
2.5000 mg | ORAL_TABLET | Freq: Every day | ORAL | 0 refills | Status: DC
  Filled 2023-05-15: qty 60, 60d supply, fill #0

## 2023-05-15 MED ORDER — OLANZAPINE 7.5 MG PO TABS
7.5000 mg | ORAL_TABLET | Freq: Every day | ORAL | 0 refills | Status: DC
  Filled 2023-05-15: qty 60, 60d supply, fill #0

## 2023-05-15 NOTE — Discharge Summary (Signed)
 Tesslyn C Fernando to be D/C'd Home per NP order. An After Visit Summary was printed and given to the patient by provider. Patient escorted, and D/C home via private auto.  Dickie La  05/15/2023 9:12 AM

## 2023-05-15 NOTE — ED Provider Notes (Signed)
 Behavioral Health Urgent Care Medical Screening Exam  Patient Name: Krystal [name] MRN: [id number] Date of Evaluation: 05/15/23 Chief Complaint:  " I have not felt this good in 6 years" Diagnosis:  Final diagnoses:  Borderline personality disorder (HCC)  PTSD (post-traumatic stress disorder)  GAD (generalized anxiety disorder)  Borderline personality disorder in adult [name])     Krystal [name], 33 y.o., female patient seen face to face by this provider, consulted with Dr. Enedina Finner; and chart reviewed on 05/15/23.   History of Present illness: Krystal [name] is a 33 y.o. female.   Patient here today, voluntarily, unaccompanied, requesting refills of psychotropic medications prescribed during a recent hospitalization at [facility] in which patient was seen by behavioral health consult provider, Dr. Stark Klein, MD. Patient was referred here to [facility] for outpatient psychiatry services, however will be moving to [facility] the end of this month, will not qualify for serves through [facility].  Patient endorses significant mental health struggles for the last 6 years, most episodes occurring during and after pregnancies.  She reports she had 3 when consecutively back-to-back and mental health worsen over the course of that time.  She reports she has been on Prozac in the past however had been off medication due to insurance and lack of follow-up.  She was seen by behavioral health services twice during her recent hospitalization at [facility] and was prescribed olanzapine 7.5 mg at bedtime, olanzapine 2.5 mg as needed twice during the day and fluoxetine 60 mg.  She reports she had been taking the olanzapine 2.5 mg at least once daily and consistently taking the 7.5 mg at bedtime and has felt great.  That her marriage was nearing divorce however since stabilizing her mental health symptoms with medications reports that her overall functioning and interactions with her family  and her spouse have restored her family structure.  He denies any suicidal, homicidal ideations.  She denies that she has heard any voices or seeing things or people that are not there.  She is requesting resources for [facility] order for her to establish with an outpatient provider.  Patient denies any other concerns today.   During evaluation Krystal [name] is sitting upright  in no acute distress.  She is alert, oriented x 4, calm, cooperative and attentive.  Her mood is euthymic with congruent affect.  She has normal speech, and behavior.  Objectively there is no evidence of psychosis/mania or delusional thinking.  Patient is able to converse coherently, goal directed thoughts, no distractibility, or pre-occupation.  She also denies suicidal/self-harm/homicidal ideation, psychosis, and paranoia.  Patient answered question appropriately.  Patient does not meet criteria for inpatient psychiatric treatment and presents today without any acute psychiatric concerns or safety concerns.  Agreed to refill patient's medication and provide resources for outpatient follow-up. Flowsheet Row ED from 05/15/2023 in [facility] ED to Hosp-Admission (Discharged) from 04/21/2023 in [place] [facility] ONCOL[facility]Y ED from 04/14/2023 in [facility] Emergency Department at [facility]  C-SSRS RISK CATEGORY No Risk No Risk No Risk       Psychiatric Specialty Exam  Presentation  General Appearance:Appropriate for Environment  Eye Contact:Fair  Speech:Clear and Coherent  Speech Volume:Increased  Handedness:Right   Mood and Affect  Mood: Anxious  Affect: Appropriate   Thought Process  Thought Processes: Coherent  Descriptions of Associations:Intact  Orientation:Full (Time, Place and Person)  Thought Content:Logical  Diagnosis of Schizophrenia or Schizoaffective disorder in past:  No data recorded  Hallucinations:None  Ideas of Reference:None  Suicidal  Thoughts:No  Homicidal Thoughts:No   Sensorium  Memory: Immediate Fair; Recent Fair; Remote Fair  Judgment: Fair  Insight:No data recorded  Executive Functions  Concentration: Fair  Attention Span:Good  Recall:Good  Fund of Knowledge:Good  Language:Good   Psychomotor Activity  Psychomotor Activity:Normal   Assets  Assets:Communication Skills; Desire for Improvement; Financial Resources/Insurance   Sleep  Sleep: Good  Number of hours: 8   Physical Exam: Physical Exam Constitutional:      General: She is not in acute distress.    Appearance: Normal appearance. She is not ill-appearing.  HENT:     Head: Normocephalic and atraumatic.     Nose: Nose normal.  Eyes:     Extraocular Movements: Extraocular movements intact.     Conjunctiva/sclera: Conjunctivae normal.     Pupils: Pupils are equal, round, and reactive to light.  Cardiovascular:     Rate and Rhythm: Normal rate and regular rhythm.  Pulmonary:     Effort: Pulmonary effort is normal.     Breath sounds: Normal breath sounds.  Musculoskeletal:     Cervical back: Normal range of motion and neck supple.  Skin:    General: Skin is warm and dry.  Neurological:     General: No focal deficit present.     Mental Status: She is alert and oriented to person, place, and time.    Review of Systems  Psychiatric/Behavioral:  Negative for depression, hallucinations, substance abuse and suicidal ideas. The patient is not nervous/anxious.     Blood pressure (!) 144/89, pulse 78, temperature (!) 97.5 F (36.4 C), resp. rate 18, SpO2 100%, not currently breastfeeding. There is no height or weight on file to calculate BMI.  Musculoskeletal: Strength & Muscle Tone: within normal limits Gait & Station: normal Patient leans: N/A   [facility] MSE Discharge Disposition for Follow up and Recommendations: Based on my evaluation the patient does not appear to have an emergency medical condition and can be discharged  with resources and follow up care in outpatient services for :  -Information provided for walk-in Behavioral Health Services at [facility]  -Medications refilled x 60 days, patient is aware that she will need to see [facility] for any future refills.  -Medication adjustment made, changed daily treatment with Olanzapine to 2.5 mg daily in the morning and continued Olanzapine 7.5 mg at bedtime. No changes to Fluoxetine. Reviewed ECG QTC interval 04/21/2023 < 452.   1. Borderline personality disorder (HCC) (Primary) - OLANZapine (ZYPREXA) 2.5 MG tablet; Take 1 tablet (2.5 mg total) by mouth daily.  Dispense: 60 tablet; Refill: 0 - FLUoxetine (PROZAC) 20 MG capsule; Take 3 capsules (60 mg total) by mouth daily.  Dispense: 60 capsule; Refill: 0  2. PTSD (post-traumatic stress disorder) - FLUoxetine (PROZAC) 20 MG capsule; Take 3 capsules (60 mg total) by mouth daily.  Dispense: 60 capsule; Refill: 0  3. GAD (generalized anxiety disorder) - FLUoxetine (PROZAC) 20 MG capsule; Take 3 capsules (60 mg total) by mouth daily.  Dispense: 60 capsule; Refill: 0  4. Borderline personality disorder in adult (HCC) - OLANZapine (ZYPREXA) 7.5 MG tablet; Take 1 tablet (7.5 mg total) by mouth at bedtime.  Dispense: 60 tablet; Refill: 0  Patient verbalizes understanding and agreement with plan today.  Patient [place] aware of emergency services if any acute psychiatric crisis develops.  While future psychiatric events cannot be accurately predicted, the patient does not currently require acute inpatient psychiatric  care and does not currently meet [facility] involuntary commitment criteria. Patient was able to engage in safety planning including plan to return to [facility] or contact emergency services if he feels unable to maintain his own safety or the safety of others.      Joaquin Courts, NP 05/15/2023, 11:21 AM
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
## 2023-05-15 NOTE — Discharge Instructions (Signed)
 Marland Kitchen

## 2023-05-17 ENCOUNTER — Other Ambulatory Visit: Payer: Self-pay

## 2023-05-17 ENCOUNTER — Encounter (HOSPITAL_COMMUNITY): Payer: Self-pay | Admitting: Emergency Medicine

## 2023-05-17 ENCOUNTER — Emergency Department (HOSPITAL_COMMUNITY)
Admission: EM | Admit: 2023-05-17 | Discharge: 2023-05-17 | Disposition: A | Discharge status: Home / Self Care | Payer: Self-pay | Attending: Emergency Medicine | Admitting: Emergency Medicine

## 2023-05-17 DIAGNOSIS — E039 Hypothyroidism, unspecified: Secondary | ICD-10-CM | POA: Insufficient documentation

## 2023-05-17 DIAGNOSIS — Z79899 Other long term (current) drug therapy: Secondary | ICD-10-CM | POA: Insufficient documentation

## 2023-05-17 DIAGNOSIS — J45909 Unspecified asthma, uncomplicated: Secondary | ICD-10-CM | POA: Insufficient documentation

## 2023-05-17 DIAGNOSIS — N3010 Interstitial cystitis (chronic) without hematuria: Secondary | ICD-10-CM | POA: Insufficient documentation

## 2023-05-17 DIAGNOSIS — Z7951 Long term (current) use of inhaled steroids: Secondary | ICD-10-CM | POA: Insufficient documentation

## 2023-05-17 DIAGNOSIS — M7989 Other specified soft tissue disorders: Secondary | ICD-10-CM | POA: Insufficient documentation

## 2023-05-17 DIAGNOSIS — R609 Edema, unspecified: Secondary | ICD-10-CM

## 2023-05-17 LAB — CBC WITH DIFFERENTIAL/PLATELET
Abs Immature Granulocytes: 0.05 10*3/uL (ref 0.00–0.07)
Basophils Absolute: 0 10*3/uL (ref 0.0–0.1)
Basophils Relative: 0 %
Eosinophils Absolute: 0.2 10*3/uL (ref 0.0–0.5)
Eosinophils Relative: 2 %
HCT: 38.4 % (ref 36.0–46.0)
Hemoglobin: 12.9 g/dL (ref 12.0–15.0)
Immature Granulocytes: 1 %
Lymphocytes Relative: 22 %
Lymphs Abs: 2 10*3/uL (ref 0.7–4.0)
MCH: 31.5 pg (ref 26.0–34.0)
MCHC: 33.6 g/dL (ref 30.0–36.0)
MCV: 93.7 fL (ref 80.0–100.0)
Monocytes Absolute: 0.7 10*3/uL (ref 0.1–1.0)
Monocytes Relative: 7 %
Neutro Abs: 6.4 10*3/uL (ref 1.7–7.7)
Neutrophils Relative %: 68 %
Platelets: 345 10*3/uL (ref 150–400)
RBC: 4.1 MIL/uL (ref 3.87–5.11)
RDW: 14 % (ref 11.5–15.5)
WBC: 9.3 10*3/uL (ref 4.0–10.5)
nRBC: 0 % (ref 0.0–0.2)

## 2023-05-17 LAB — LIPASE, BLOOD: Lipase: 30 U/L (ref 11–51)

## 2023-05-17 LAB — COMPREHENSIVE METABOLIC PANEL
ALT: 18 U/L (ref 0–44)
AST: 33 U/L (ref 15–41)
Albumin: 3.9 g/dL (ref 3.5–5.0)
Alkaline Phosphatase: 49 U/L (ref 38–126)
Anion gap: 8 (ref 5–15)
BUN: 28 mg/dL — ABNORMAL HIGH (ref 6–20)
CO2: 23 mmol/L (ref 22–32)
Calcium: 9.4 mg/dL (ref 8.9–10.3)
Chloride: 105 mmol/L (ref 98–111)
Creatinine, Ser: 0.77 mg/dL (ref 0.44–1.00)
GFR, Estimated: >60 mL/min (ref >60)
Glucose, Bld: 99 mg/dL (ref 70–99)
Potassium: 4.2 mmol/L (ref 3.5–5.1)
Sodium: 136 mmol/L (ref 135–145)
Total Bilirubin: 0.9 mg/dL (ref 0.0–1.2)
Total Protein: 6.9 g/dL (ref 6.5–8.1)

## 2023-05-17 LAB — URINALYSIS, ROUTINE W REFLEX MICROSCOPIC
Bilirubin Urine: NEGATIVE
Glucose, UA: NEGATIVE mg/dL
Hgb urine dipstick: NEGATIVE
Ketones, ur: NEGATIVE mg/dL
Leukocytes,Ua: NEGATIVE
Nitrite: NEGATIVE
Protein, ur: NEGATIVE mg/dL
Specific Gravity, Urine: 1.015 (ref 1.005–1.030)
pH: 7 (ref 5.0–8.0)

## 2023-05-17 LAB — PREGNANCY, URINE: Preg Test, Ur: NEGATIVE

## 2023-05-17 MED ORDER — MORPHINE SULFATE (PF) 4 MG/ML IV SOLN
4.0000 mg | Freq: Once | INTRAVENOUS | Status: AC
  Administered 2023-05-17: 4 mg via INTRAVENOUS
  Filled 2023-05-17: qty 1

## 2023-05-17 MED ORDER — DIPHENHYDRAMINE HCL 50 MG/ML IJ SOLN
12.5000 mg | Freq: Once | INTRAMUSCULAR | Status: AC
  Administered 2023-05-17: 12.5 mg via INTRAVENOUS
  Filled 2023-05-17: qty 1

## 2023-05-17 MED ORDER — MORPHINE SULFATE (PF) 2 MG/ML IV SOLN
2.0000 mg | Freq: Once | INTRAVENOUS | Status: AC
  Administered 2023-05-17: 2 mg via INTRAVENOUS
  Filled 2023-05-17: qty 1

## 2023-05-17 NOTE — ED Provider Notes (Incomplete)
 [place] EMERGENCY DEPARTMENT AT [facility] Provider Note   CSN: [id number] Arrival date & time: 05/17/23  1314     History {Add pertinent medical, surgical, social history, OB history to HPI:1} Chief Complaint  Patient presents with  . Dysuria  . Nausea  . Emesis    Krystal [name] is a 33 y.o. female with past medical history of asthma, anxiety, fibromyalgia, GERD, hypothyroidism, IC, borderline personality disorder, adrenal insufficiency presents to emergency department for evaluation of bilateral lower back pain that radiates into lower abdomen with associated nausea and vomiting that started yesterday afternoon.  She has also had dysuria, bilateral BUE swelling.  She reports that she normally takes steroids twice a day but missed a dose yesterday morning.  She had no complaints prior to missing her steroid dose.  She took tramadol today for pain without relief.  She is had 3 loose liquid stools today. She denies fevers  Has cystoscopy with hydrodistenstion of bladder for IC scheduled 05/22/23   Dysuria Associated symptoms: vomiting   Emesis    Home Medications Prior to Admission medications   Medication Sig Start Date End Date Taking? Authorizing Provider  acetaminophen (TYLENOL) 500 MG tablet Take 1,000 mg by mouth 3 (three) times daily as needed for moderate pain (pain score 4-6), headache, fever or mild pain (pain score 1-3).    [provider]  albuterol (VENTOLIN HFA) 108 (90 Base) MCG/ACT inhaler Inhale 2 puffs into the lungs every 4 (four) hours as needed for wheezing or shortness of breath. Patient taking differently: Inhale 2 puffs into the lungs as needed for wheezing or shortness of breath. 10/15/22   Ellamae Sia, DO  bupivacaine,PF, (MARCAINE) 0.5 % SOLN injection Instill 15 mLs into bladder daily with heparin. Patient not taking: Reported on 04/22/2023 04/06/23     dicyclomine (BENTYL) 20 MG tablet Take 1 tablet (20 mg total) by mouth 2  (two) times daily. Patient not taking: Reported on 04/22/2023 03/29/23   Linwood Dibbles, MD  EPINEPHrine 0.3 mg/0.3 mL IJ SOAJ injection Inject 0.3 mg into the muscle as needed for anaphylaxis. 01/15/21   Ellamae Sia, DO  FLUoxetine (PROZAC) 20 MG capsule Take 3 capsules (60 mg total) by mouth daily. 05/15/23 07/14/23  Bing Neighbors, NP  heparin 38756 UNIT/ML injection Instill 4 mLs (40,000 Units total) into the bladder daily with bupivacaine. Patient not taking: Reported on 04/22/2023 04/06/23     hydrocortisone (CORTEF) 10 MG tablet Take 3 tablets (30 mg total) by mouth every morning. IM program 04/27/23 05/27/23  Briant Cedar, MD  hydrocortisone (CORTEF) 10 MG tablet Take 1 tablet (10 mg total) by mouth every evening. 04/27/23 05/27/23  Briant Cedar, MD  irbesartan-hydrochlorothiazide (AVALIDE) 150-12.5 MG tablet Take 2 tablets by mouth in the morning and at bedtime. 10/06/22 10/06/23  Morene Crocker, MD  levothyroxine (SYNTHROID) 150 MCG tablet Take 1 tablet (150 mcg total) by mouth daily at 6 (six) AM. 10/06/22 10/06/23  Morene Crocker, MD  linaclotide [name]) 145 MCG CAPS capsule Take 1 capsule (145 mcg total) by mouth daily before breakfast. Patient not taking: Reported on 04/22/2023 04/08/23 05/08/23  Charlynne Pander, MD  NEEDLE, DISP, 18 G (BD DISP NEEDLES) 18G X 1-1/2" MISC Use as directed with daily bladder instillations 04/06/23     OLANZapine (ZYPREXA) 2.5 MG tablet Take 1 tablet (2.5 mg total) by mouth daily. 05/15/23 07/14/23  Bing Neighbors, NP  OLANZapine (ZYPREXA) 2.5 MG tablet Take 1 tablet (  2.5 mg total) by mouth daily. 05/15/23   Bing Neighbors, NP  OLANZapine (ZYPREXA) 7.5 MG tablet Take 1 tablet (7.5 mg total) by mouth at bedtime. 05/15/23 07/14/23  Bing Neighbors, NP  OLANZapine (ZYPREXA) 7.5 MG tablet Take 1 tablet (7.5 mg total) by mouth at bedtime. 05/15/23   Bing Neighbors, NP  pantoprazole (PROTONIX) 40 MG tablet Take 1 tablet (40 mg total) by  mouth 2 (two) times daily. Patient taking differently: Take 40 mg by mouth at bedtime. 03/21/23 04/22/23  Lanae Boast, MD  promethazine (PHENERGAN) 25 MG suppository Place 1 suppository (25 mg total) rectally every 6 (six) hours as needed for nausea or vomiting. 04/27/23 05/27/23  Briant Cedar, MD  Syringe, Disposable, 20 ML MISC Use as directed for daily bladder instillations 04/06/23     traMADol (ULTRAM) 50 MG tablet Take 1 tablet (50 mg total) by mouth every 12 (twelve) hours as needed for moderate pain (pain score 4-6). 04/27/23   Briant Cedar, MD      Allergies    Bee venom, Coreg [carvedilol], Red dye #40 (allura red), Compazine [prochlorperazine maleate], Oxycodone, Vicodin [hydrocodone-acetaminophen], Cymbalta [duloxetine hcl], Ditropan [oxybutynin], Elavil [amitriptyline], Motrin [ibuprofen], Naprosyn [naproxen], Pyrimethamine, Sulfadoxine, Zestril [lisinopril], Nifedical xl [nifedipine], Reglan [metoclopramide], and Trandate [labetalol]    Review of Systems   Review of Systems  Gastrointestinal:  Positive for vomiting.  Genitourinary:  Positive for dysuria.    Physical Exam Updated Vital Signs BP (!) 154/93   Pulse 74   Temp 98.5 F (36.9 C) (Oral)   Resp 18   LMP 04/30/2023 (Exact Date)   SpO2 97%   Breastfeeding No  Physical Exam Vitals and nursing note reviewed.  Constitutional:      General: She is not in acute distress.    Appearance: Normal appearance.  HENT:     Head: Normocephalic and atraumatic.  Eyes:     Conjunctiva/sclera: Conjunctivae normal.  Cardiovascular:     Rate and Rhythm: Normal rate.  Pulmonary:     Effort: Pulmonary effort is normal. No respiratory distress.     Breath sounds: Normal breath sounds.  Abdominal:     General: Bowel sounds are normal.     Palpations: Abdomen is soft.     Tenderness: There is generalized abdominal tenderness. There is no right CVA tenderness, left CVA tenderness, guarding or rebound.  Musculoskeletal:      Right lower leg: No edema.     Left lower leg: No edema.     Comments: No warmth, erythema, swelling, nor tenderness to BLE  Skin:    Capillary Refill: Capillary refill takes less than 2 seconds.     Coloration: Skin is not jaundiced or pale.     Comments: Mild swelling of bilateral forearms. Right hand graft and well healed scar No erythema nor warmth of forearms to suggest cellulitis. Previous amputation of left hand  Neurological:     General: No focal deficit present.     Mental Status: She is alert and oriented to person, place, and time. Mental status is at baseline.     Cranial Nerves: No cranial nerve deficit.     Gait: Gait normal.     Comments: Motor 5/5 of BUE and BLE.  Sensation 2/2 of BUE and BLE. Follows commands without difficulty. Ambulates wo difficulty     ED Results / Procedures / Treatments   Labs (all labs ordered are listed, but only abnormal results are displayed) Labs Reviewed  COMPREHENSIVE METABOLIC  PANEL - Abnormal; Notable for the following components:      Result Value   BUN 28 (*)    All other components within normal limits  URINALYSIS, ROUTINE W REFLEX MICROSCOPIC  PREGNANCY, URINE  CBC WITH DIFFERENTIAL/PLATELET  LIPASE, BLOOD    EKG None  Radiology No results found.  Procedures Procedures  {Document cardiac monitor, telemetry assessment procedure when appropriate:1}  Medications Ordered in ED Medications  morphine (PF) 4 MG/ML injection 4 mg (4 mg Intravenous Given 05/17/23 1732)  diphenhydrAMINE (BENADRYL) injection 12.5 mg (12.5 mg Intravenous Given 05/17/23 1732)  morphine (PF) 2 MG/ML injection 2 mg (2 mg Intravenous Given 05/17/23 1938)    ED Course/ Medical Decision Making/ A&P Clinical Course as of 05/18/23 0004  Sun May 17, 2023  1649 IC/Joint swelling  [CC]    Clinical Course User Index [CC] Glyn Ade, MD   {   Click here for ABCD2, HEART and other calculatorsREFRESH Note before signing :1}                               Medical Decision Making Amount and/or Complexity of Data Reviewed Labs: ordered.  Risk Prescription drug management.     Patient presents to the ED for concern of ***, this involves an extensive number of treatment options, and is a complaint that carries with it a high risk of complications and morbidity.  The differential diagnosis includes ***   Co morbidities that complicate the patient evaluation  ***   Additional history obtained:  Additional history obtained from *** {Blank multiple:19196::"EMS","Family","Nursing","Outside Medical Records","Past Admission"}   External records from outside source obtained and reviewed including ***   Lab Tests:  I Ordered, and personally interpreted labs.  The pertinent results include:  ***   Imaging Studies ordered:  I ordered imaging studies including ***  I independently visualized and interpreted imaging which showed *** I agree with the radiologist interpretation   Cardiac Monitoring:  The patient was maintained on a cardiac monitor.  I personally viewed and interpreted the cardiac monitored which showed an underlying rhythm of: ***   Medicines ordered and prescription drug management:  I ordered medication including ***  for ***  Reevaluation of the patient after these medicines showed that the patient {resolved/improved/worsened:23923::"improved"} I have reviewed the patients home medicines and have made adjustments as needed   Test Considered:  ***   Critical Interventions:  ***   Consultations Obtained:  I requested consultation with the ***,  and discussed lab and imaging findings as well as pertinent plan - they recommend: ***   Problem List / ED Course:  Chronic IC Labs are grossly unremarkable. Patient has had multiple visits regarding similar symptoms of abdominal pain related to her IC.  Last CT was 03/13/2023 with similar symptoms and no abnormalities.  With reassuring labs and  similar symptoms to prior, I do not feel that CT imaging would change treatment plan or show any gross abnormalities Pain improved with medication UE swelling Low suspicion for adrenal crisis.  Blood pressure has remained normal tensive throughout visit Low suspicion for cellulitis with no infectious signs on PE   Reevaluation:  After the interventions noted above, I reevaluated the patient and found that they have :improved    Dispostion:  After consideration of the diagnostic results and the patients response to treatment, I feel that the patent would benefit from outpatient management and follow up with endocrinology, GI, urology.  Dr. Doran Durand individually assessed patient and agrees to plan Final Clinical Impression(s) / ED Diagnoses Final diagnoses:  Chronic interstitial cystitis  Swelling    Rx / DC Orders ED Discharge Orders     None

## 2023-05-17 NOTE — ED Triage Notes (Signed)
 Pt c/o N/V and pain with urination. Pain started yesterday afternoon. Pt states pain is in back and radiates around to bladder. Pt also states she has swelling in her joints and hand.

## 2023-05-17 NOTE — Discharge Instructions (Signed)
 Thank you for letting us evaluate you today.  I have provided you with a new PCP if you need 1.  Please make sure to follow-up with your GI and endocrinology appointments for further management.  At this time, your lab work all looks really good and I do not see any emergent cause of symptoms.  You do not appear to be in adrenal crisis. Likey this is related to IC flare  Return to emergency department if you have significant worsening of symptoms

## 2023-05-17 NOTE — ED Provider Notes (Signed)
 [place] EMERGENCY DEPARTMENT AT [facility] Provider Note   CSN: [id number] Arrival date & time: 05/17/23  1314     History {Add pertinent medical, surgical, social history, OB history to HPI:1} Chief Complaint  Patient presents with   Dysuria   Nausea   Emesis    Krystal [name] is a 33 y.o. female with past medical history of asthma, anxiety, fibromyalgia, GERD, hypothyroidism, IC, borderline personality disorder, adrenal insufficiency presents to emergency department for evaluation of bilateral lower back pain that radiates into lower abdomen with associated nausea and vomiting that started yesterday afternoon.  She has also had dysuria, bilateral BUE swelling.  She reports that she normally takes steroids twice a day but missed a dose yesterday morning.  She had no complaints prior to missing her steroid dose.  She took tramadol today for pain without relief.  She is had 3 loose liquid stools today. She denies fevers  Has cystoscopy with hydrodistenstion of bladder for IC scheduled 05/22/23   Dysuria Associated symptoms: vomiting   Emesis    Home Medications Prior to Admission medications   Medication Sig Start Date End Date Taking? Authorizing Provider  acetaminophen (TYLENOL) 500 MG tablet Take 1,000 mg by mouth 3 (three) times daily as needed for moderate pain (pain score 4-6), headache, fever or mild pain (pain score 1-3).    [provider]  albuterol (VENTOLIN HFA) 108 (90 Base) MCG/ACT inhaler Inhale 2 puffs into the lungs every 4 (four) hours as needed for wheezing or shortness of breath. Patient taking differently: Inhale 2 puffs into the lungs as needed for wheezing or shortness of breath. 10/15/22   Ellamae Sia, DO  bupivacaine,PF, (MARCAINE) 0.5 % SOLN injection Instill 15 mLs into bladder daily with heparin. Patient not taking: Reported on 04/22/2023 04/06/23     dicyclomine (BENTYL) 20 MG tablet Take 1 tablet (20 mg total) by mouth 2 (two)  times daily. Patient not taking: Reported on 04/22/2023 03/29/23   Linwood Dibbles, MD  EPINEPHrine 0.3 mg/0.3 mL IJ SOAJ injection Inject 0.3 mg into the muscle as needed for anaphylaxis. 01/15/21   Ellamae Sia, DO  FLUoxetine (PROZAC) 20 MG capsule Take 3 capsules (60 mg total) by mouth daily. 05/15/23 07/14/23  Bing Neighbors, NP  heparin 40981 UNIT/ML injection Instill 4 mLs (40,000 Units total) into the bladder daily with bupivacaine. Patient not taking: Reported on 04/22/2023 04/06/23     hydrocortisone (CORTEF) 10 MG tablet Take 3 tablets (30 mg total) by mouth every morning. IM program 04/27/23 05/27/23  Briant Cedar, MD  hydrocortisone (CORTEF) 10 MG tablet Take 1 tablet (10 mg total) by mouth every evening. 04/27/23 05/27/23  Briant Cedar, MD  irbesartan-hydrochlorothiazide (AVALIDE) 150-12.5 MG tablet Take 2 tablets by mouth in the morning and at bedtime. 10/06/22 10/06/23  Morene Crocker, MD  levothyroxine (SYNTHROID) 150 MCG tablet Take 1 tablet (150 mcg total) by mouth daily at 6 (six) AM. 10/06/22 10/06/23  Morene Crocker, MD  linaclotide [name]) 145 MCG CAPS capsule Take 1 capsule (145 mcg total) by mouth daily before breakfast. Patient not taking: Reported on 04/22/2023 04/08/23 05/08/23  Charlynne Pander, MD  NEEDLE, DISP, 18 G (BD DISP NEEDLES) 18G X 1-1/2" MISC Use as directed with daily bladder instillations 04/06/23     OLANZapine (ZYPREXA) 2.5 MG tablet Take 1 tablet (2.5 mg total) by mouth daily. 05/15/23 07/14/23  Bing Neighbors, NP  OLANZapine (ZYPREXA) 2.5 MG tablet Take 1 tablet (  2.5 mg total) by mouth daily. 05/15/23   Bing Neighbors, NP  OLANZapine (ZYPREXA) 7.5 MG tablet Take 1 tablet (7.5 mg total) by mouth at bedtime. 05/15/23 07/14/23  Bing Neighbors, NP  OLANZapine (ZYPREXA) 7.5 MG tablet Take 1 tablet (7.5 mg total) by mouth at bedtime. 05/15/23   Bing Neighbors, NP  pantoprazole (PROTONIX) 40 MG tablet Take 1 tablet (40 mg total) by mouth 2  (two) times daily. Patient taking differently: Take 40 mg by mouth at bedtime. 03/21/23 04/22/23  Lanae Boast, MD  promethazine (PHENERGAN) 25 MG suppository Place 1 suppository (25 mg total) rectally every 6 (six) hours as needed for nausea or vomiting. 04/27/23 05/27/23  Briant Cedar, MD  Syringe, Disposable, 20 ML MISC Use as directed for daily bladder instillations 04/06/23     traMADol (ULTRAM) 50 MG tablet Take 1 tablet (50 mg total) by mouth every 12 (twelve) hours as needed for moderate pain (pain score 4-6). 04/27/23   Briant Cedar, MD      Allergies    Bee venom, Coreg [carvedilol], Red dye #40 (allura red), Compazine [prochlorperazine maleate], Oxycodone, Vicodin [hydrocodone-acetaminophen], Cymbalta [duloxetine hcl], Ditropan [oxybutynin], Elavil [amitriptyline], Motrin [ibuprofen], Naprosyn [naproxen], Pyrimethamine, Sulfadoxine, Zestril [lisinopril], Nifedical xl [nifedipine], Reglan [metoclopramide], and Trandate [labetalol]    Review of Systems   Review of Systems  Gastrointestinal:  Positive for vomiting.  Genitourinary:  Positive for dysuria.    Physical Exam Updated Vital Signs BP (!) 155/93 (BP Location: Right Arm)   Pulse 94   Temp 98.5 F (36.9 C) (Oral)   Resp 18   LMP 04/30/2023 (Exact Date)   Breastfeeding No  Physical Exam Vitals and nursing note reviewed.  Constitutional:      General: She is not in acute distress.    Appearance: Normal appearance.  HENT:     Head: Normocephalic and atraumatic.  Eyes:     Conjunctiva/sclera: Conjunctivae normal.  Cardiovascular:     Rate and Rhythm: Normal rate.  Pulmonary:     Effort: Pulmonary effort is normal. No respiratory distress.  Musculoskeletal:     Comments: Previous amputation of left wrist, hand  Skin:    Coloration: Skin is not jaundiced or pale.     Comments: Erythema to BUE Swelling to right hand,  left forearm Right hand graft  Neurological:     Mental Status: She is alert. Mental status  is at baseline.     ED Results / Procedures / Treatments   Labs (all labs ordered are listed, but only abnormal results are displayed) Labs Reviewed  URINALYSIS, ROUTINE W REFLEX MICROSCOPIC  PREGNANCY, URINE    EKG None  Radiology No results found.  Procedures Procedures  {Document cardiac monitor, telemetry assessment procedure when appropriate:1}  Medications Ordered in ED Medications - No data to display  ED Course/ Medical Decision Making/ A&P   {   Click here for ABCD2, HEART and other calculatorsREFRESH Note before signing :1}                              Medical Decision Making Amount and/or Complexity of Data Reviewed Labs: ordered.   ***  {Document critical care time when appropriate:1} {Document review of labs and clinical decision tools ie heart score, Chads2Vasc2 etc:1}  {Document your independent review of radiology images, and any outside records:1} {Document your discussion with family members, caretakers, and with consultants:1} {Document social determinants of health affecting  pt's care:1} {Document your decision making why or why not admission, treatments were needed:1} Final Clinical Impression(s) / ED Diagnoses Final diagnoses:  None    Rx / DC Orders ED Discharge Orders     None

## 2023-05-18 ENCOUNTER — Other Ambulatory Visit (HOSPITAL_COMMUNITY): Payer: Self-pay

## 2023-05-28 ENCOUNTER — Emergency Department (HOSPITAL_COMMUNITY)
Admission: EM | Admit: 2023-05-28 | Discharge: 2023-05-28 | Disposition: A | Discharge status: Home / Self Care | Payer: Self-pay | Attending: Emergency Medicine | Admitting: Emergency Medicine

## 2023-05-28 DIAGNOSIS — N3010 Interstitial cystitis (chronic) without hematuria: Secondary | ICD-10-CM | POA: Insufficient documentation

## 2023-05-28 LAB — COMPREHENSIVE METABOLIC PANEL WITH GFR
ALT: 26 U/L (ref 0–44)
AST: 24 U/L (ref 15–41)
Albumin: 4 g/dL (ref 3.5–5.0)
Alkaline Phosphatase: 51 U/L (ref 38–126)
Anion gap: 11 (ref 5–15)
BUN: 22 mg/dL — ABNORMAL HIGH (ref 6–20)
CO2: 24 mmol/L (ref 22–32)
Calcium: 8.4 mg/dL — ABNORMAL LOW (ref 8.9–10.3)
Chloride: 100 mmol/L (ref 98–111)
Creatinine, Ser: 0.7 mg/dL (ref 0.44–1.00)
GFR, Estimated: >60 mL/min (ref >60)
Glucose, Bld: 100 mg/dL — ABNORMAL HIGH (ref 70–99)
Potassium: 3.5 mmol/L (ref 3.5–5.1)
Sodium: 135 mmol/L (ref 135–145)
Total Bilirubin: 0.8 mg/dL (ref 0.0–1.2)
Total Protein: 7.5 g/dL (ref 6.5–8.1)

## 2023-05-28 LAB — URINALYSIS, ROUTINE W REFLEX MICROSCOPIC
Bacteria, UA: NONE SEEN
Bilirubin Urine: NEGATIVE
Glucose, UA: NEGATIVE mg/dL
Hgb urine dipstick: NEGATIVE
Ketones, ur: NEGATIVE mg/dL
Nitrite: NEGATIVE
Protein, ur: 30 mg/dL — AB
Specific Gravity, Urine: 1.023 (ref 1.005–1.030)
pH: 7 (ref 5.0–8.0)

## 2023-05-28 LAB — CBC
HCT: 44.2 % (ref 36.0–46.0)
Hemoglobin: 14.6 g/dL (ref 12.0–15.0)
MCH: 31 pg (ref 26.0–34.0)
MCHC: 33 g/dL (ref 30.0–36.0)
MCV: 93.8 fL (ref 80.0–100.0)
Platelets: 367 10*3/uL (ref 150–400)
RBC: 4.71 MIL/uL (ref 3.87–5.11)
RDW: 14.5 % (ref 11.5–15.5)
WBC: 9.5 10*3/uL (ref 4.0–10.5)
nRBC: 0 % (ref 0.0–0.2)

## 2023-05-28 LAB — HCG, SERUM, QUALITATIVE: Preg, Serum: NEGATIVE

## 2023-05-28 LAB — LIPASE, BLOOD: Lipase: 24 U/L (ref 11–51)

## 2023-05-28 MED ORDER — DIAZEPAM 5 MG/ML IJ SOLN
2.5000 mg | Freq: Once | INTRAMUSCULAR | Status: AC
  Administered 2023-05-28: 2.5 mg via INTRAVENOUS
  Filled 2023-05-28: qty 2

## 2023-05-28 MED ORDER — SODIUM CHLORIDE 0.9 % IV BOLUS
1000.0000 mL | Freq: Once | INTRAVENOUS | Status: AC
  Administered 2023-05-28: 1000 mL via INTRAVENOUS

## 2023-05-28 MED ORDER — FENTANYL CITRATE PF 50 MCG/ML IJ SOSY
50.0000 ug | PREFILLED_SYRINGE | Freq: Once | INTRAMUSCULAR | Status: AC
  Administered 2023-05-28: 50 ug via INTRAVENOUS
  Filled 2023-05-28: qty 1

## 2023-05-28 MED ORDER — SODIUM CHLORIDE 0.9 % IV SOLN
1.5000 mg/kg | Freq: Once | INTRAVENOUS | Status: AC
  Administered 2023-05-28: 150 mg via INTRAVENOUS
  Filled 2023-05-28: qty 7.5

## 2023-05-28 MED ORDER — ONDANSETRON HCL 4 MG/2ML IJ SOLN
4.0000 mg | Freq: Once | INTRAMUSCULAR | Status: AC
  Administered 2023-05-28: 4 mg via INTRAVENOUS
  Filled 2023-05-28: qty 2

## 2023-05-28 MED ORDER — HYOSCYAMINE SULFATE 0.125 MG SL SUBL: 0.1250 mg | SUBLINGUAL_TABLET | SUBLINGUAL | 1 refills | Status: DC | PRN

## 2023-05-28 MED ORDER — HYDROMORPHONE HCL 1 MG/ML IJ SOLN
0.5000 mg | Freq: Once | INTRAMUSCULAR | Status: AC
  Administered 2023-05-28: 0.5 mg via INTRAVENOUS
  Filled 2023-05-28: qty 1

## 2023-05-28 MED ORDER — DROPERIDOL 2.5 MG/ML IJ SOLN
1.2500 mg | Freq: Once | INTRAMUSCULAR | Status: AC
  Administered 2023-05-28: 1.25 mg via INTRAVENOUS
  Filled 2023-05-28: qty 2

## 2023-05-28 NOTE — ED Provider Notes (Signed)
 [place] EMERGENCY DEPARTMENT AT [facility] Provider Note   CSN: [id number] Arrival date & time: 05/28/23  1036     History  Chief Complaint  Patient presents with   Abdominal Pain    Krystal [name] is a 33 y.o. female who presents emergency department for evaluation of abdominal pain nausea vomiting and diarrhea.  Patient has a past medical history that is extensive and complicated.  She has adrenal insufficiency and is on hydrocortisone.  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  She has a history of interstitial cystitis.  Yesterday she began having bilateral flank pain worse on the left that has progressed into her abdomen.  She has suprapubic abdominal pain, urgency to urinate and has not urinated once today.  She has also developed nausea vomiting and diarrhea.  She rates her pain at 8 out of 10 cramping, severe.  She denies fevers or chills.   Abdominal Pain      Home Medications Prior to Admission medications   Medication Sig Start Date End Date Taking? Authorizing Provider  acetaminophen (TYLENOL) 500 MG tablet Take 1,000 mg by mouth 3 (three) times daily as needed for moderate pain (pain score 4-6), headache, fever or mild pain (pain score 1-3).    [provider]  albuterol (VENTOLIN HFA) 108 (90 Base) MCG/ACT inhaler Inhale 2 puffs into the lungs every 4 (four) hours as needed for wheezing or shortness of breath. Patient taking differently: Inhale 2 puffs into the lungs as needed for wheezing or shortness of breath. 10/15/22   Ellamae Sia, DO  bupivacaine,PF, (MARCAINE) 0.5 % SOLN injection Instill 15 mLs into bladder daily with heparin. Patient not taking: Reported on 04/22/2023 04/06/23     dicyclomine (BENTYL) 20 MG tablet Take 1 tablet (20 mg total) by mouth 2 (two) times daily. Patient not taking: Reported on 04/22/2023 03/29/23   Linwood Dibbles, MD  EPINEPHrine 0.3 mg/0.3 mL IJ SOAJ injection Inject  0.3 mg into the muscle as needed for anaphylaxis. 01/15/21   Ellamae Sia, DO  FLUoxetine (PROZAC) 20 MG capsule Take 3 capsules (60 mg total) by mouth daily. 05/15/23 07/14/23  Bing Neighbors, NP  heparin 40981 UNIT/ML injection Instill 4 mLs (40,000 Units total) into the bladder daily with bupivacaine. Patient not taking: Reported on 04/22/2023 04/06/23     hyoscyamine (LEVSIN/SL) 0.125 MG SL tablet Place 1 tablet (0.125 mg total) under the tongue every 4 (four) hours as needed (spasm). Up to 1.25 mg daily 05/29/23   Achille Rich, PA-C  irbesartan-hydrochlorothiazide (AVALIDE) 150-12.5 MG tablet Take 2 tablets by mouth in the morning and at bedtime. 10/06/22 10/06/23  Morene Crocker, MD  levothyroxine (SYNTHROID) 150 MCG tablet Take 1 tablet (150 mcg total) by mouth daily at 6 (six) AM. 10/06/22 10/06/23  Morene Crocker, MD  linaclotide [name]) 145 MCG CAPS capsule Take 1 capsule (145 mcg total) by mouth daily before breakfast. Patient not taking: Reported on 04/22/2023 04/08/23 05/08/23  Charlynne Pander, MD  NEEDLE, DISP, 18 G (BD DISP NEEDLES) 18G X 1-1/2" MISC Use as directed with daily bladder instillations 04/06/23     OLANZapine (ZYPREXA) 2.5 MG tablet Take 1 tablet (2.5 mg total) by mouth daily. 05/15/23 07/14/23  Bing Neighbors, NP  OLANZapine (ZYPREXA) 2.5 MG tablet Take 1 tablet (2.5 mg total) by mouth daily. 05/15/23   Bing Neighbors, NP  OLANZapine (ZYPREXA) 7.5 MG tablet Take 1  tablet (7.5 mg total) by mouth at bedtime. 05/15/23 07/14/23  Bing Neighbors, NP  OLANZapine (ZYPREXA) 7.5 MG tablet Take 1 tablet (7.5 mg total) by mouth at bedtime. 05/15/23   Bing Neighbors, NP  pantoprazole (PROTONIX) 40 MG tablet Take 1 tablet (40 mg total) by mouth 2 (two) times daily. Patient taking differently: Take 40 mg by mouth at bedtime. 03/21/23 04/22/23  Lanae Boast, MD  promethazine (PHENERGAN) 25 MG suppository Place 1 suppository (25 mg total) rectally every 6 (six) hours as  needed for nausea or vomiting. 04/27/23 05/27/23  Briant Cedar, MD  Syringe, Disposable, 20 ML MISC Use as directed for daily bladder instillations 04/06/23     traMADol (ULTRAM) 50 MG tablet Take 1 tablet (50 mg total) by mouth every 12 (twelve) hours as needed for moderate pain (pain score 4-6). 04/27/23   Briant Cedar, MD      Allergies    Bee venom, Coreg [carvedilol], Red dye #40 (allura red), Compazine [prochlorperazine maleate], Oxycodone, Vicodin [hydrocodone-acetaminophen], Cymbalta [duloxetine hcl], Ditropan [oxybutynin], Elavil [amitriptyline], Motrin [ibuprofen], Naprosyn [naproxen], Pyrimethamine, Sulfadoxine, Zestril [lisinopril], Nifedical xl [nifedipine], Reglan [metoclopramide], and Trandate [labetalol]    Review of Systems   Review of Systems  Gastrointestinal:  Positive for abdominal pain.    Physical Exam Updated Vital Signs BP 133/79 (BP Location: Left Arm)   Pulse 71   Temp 98.6 F (37 C) (Oral)   Resp 16   LMP 04/30/2023 (Exact Date)   SpO2 100%  Physical Exam Vitals and nursing note reviewed.  Constitutional:      General: She is not in acute distress.    Appearance: She is well-developed. She is ill-appearing. She is not toxic-appearing or diaphoretic.  HENT:     Head: Normocephalic and atraumatic.     Right Ear: External ear normal.     Left Ear: External ear normal.     Nose: Nose normal.     Mouth/Throat:     Mouth: Mucous membranes are dry.  Eyes:     General: No scleral icterus.    Conjunctiva/sclera: Conjunctivae normal.  Cardiovascular:     Rate and Rhythm: Normal rate and regular rhythm.     Heart sounds: Normal heart sounds. No murmur heard.    No friction rub. No gallop.  Pulmonary:     Effort: Pulmonary effort is normal. No respiratory distress.     Breath sounds: Normal breath sounds.  Abdominal:     General: Bowel sounds are normal. There is no distension.     Palpations: Abdomen is soft. There is no mass.     Tenderness:  There is abdominal tenderness in the suprapubic area. There is no right CVA tenderness, left CVA tenderness or guarding.  Musculoskeletal:     Cervical back: Normal range of motion.  Skin:    General: Skin is warm and dry.     Comments: Telangiectasia of the face and striae of the abdomen consistent with history of chronic steroid use  Neurological:     Mental Status: She is alert and oriented to person, place, and time.  Psychiatric:        Behavior: Behavior normal.     ED Results / Procedures / Treatments   Labs (all labs ordered are listed, but only abnormal results are displayed) Labs Reviewed  COMPREHENSIVE METABOLIC PANEL WITH GFR - Abnormal; Notable for the following components:      Result Value   Glucose, Bld 100 (*)    BUN  22 (*)    Calcium 8.4 (*)    All other components within normal limits  URINALYSIS, ROUTINE W REFLEX MICROSCOPIC - Abnormal; Notable for the following components:   APPearance HAZY (*)    Protein, ur 30 (*)    Leukocytes,Ua TRACE (*)    All other components within normal limits  LIPASE, BLOOD  CBC  HCG, SERUM, QUALITATIVE    EKG None  Radiology No results found.  Procedures Procedures    Medications Ordered in ED Medications  sodium chloride 0.9 % bolus 1,000 mL (0 mLs Intravenous Stopped 05/28/23 1604)  fentaNYL (SUBLIMAZE) injection 50 mcg (50 mcg Intravenous Given 05/28/23 1309)  ondansetron (ZOFRAN) injection 4 mg (4 mg Intravenous Given 05/28/23 1307)  sodium chloride 0.9 % bolus 1,000 mL (0 mLs Intravenous Stopped 05/28/23 1717)  HYDROmorphone (DILAUDID) injection 0.5 mg (0.5 mg Intravenous Given 05/28/23 1435)  droperidol (INAPSINE) 2.5 MG/ML injection 1.25 mg (1.25 mg Intravenous Given 05/28/23 1635)  diazepam (VALIUM) injection 2.5 mg (2.5 mg Intravenous Given 05/28/23 1636)  lidocaine (XYLOCAINE) 150 mg in sodium chloride 0.9 % 100 mL IVPB (0 mg Intravenous Stopped 05/28/23 1746)    ED Course/ Medical Decision Making/ A&P Clinical Course  as of 05/30/23 1633  Thu May 28, 2023  1342 Comprehensive metabolic panel(!) [AH]  1615 Comprehensive metabolic panel(!) [AH]  1615 hCG, serum, qualitative [AH]  1615 Urinalysis, Routine w reflex microscopic -Urine, Clean Catch(!) [AH]  1615 WBC: 9.5 [AH]    Clinical Course User Index [AH] Arthor Captain, PA-C                                 Medical Decision Making Amount and/or Complexity of Data Reviewed Labs: ordered. Decision-making details documented in ED Course.  Risk Prescription drug management.   Patient here with n/v/d and suprapubic pain.  The emergent differential diagnosis for vomiting includes, but is not limited to ACS/MI, DKA, Ischemic bowel, Meningitis, Sepsis, Acute gastric dilation, Adrenal insufficiency, Appendicitis,  Bowel obstruction/ileus, Carbon monoxide poisoning, Cholecystitis, Electrolyte abnormalities, Elevated ICP, Gastric outlet obstruction, Pancreatitis, Ruptured viscus, Biliary colic, Cannabinoid hyperemesis syndrome, Gastritis, Gastroenteritis, Gastroparesis,  Narcotic withdrawal, Peptic ulcer disease, and UTI  Hx of adrenal insufficiency, fibromyalgia, IC.  Labs: Urine shows Pyuria, no bateriuria- likely due to hx of interstitial cysitis No lueukocytosis Labs otherwise impertinent  Patient has no CVA tenderness, no vomiting, no diarrhea here in the ED over 7 hours.  Patient had only minimal relief with anitemetics and narcotics alone. Patient had  the most relief with IV lidocaine (renal colic protocol) and droperidol.  Pain is greatly diminshed and I suspect sxs are related to an IC flair. Willhave her follow up with her IC specialist.   Discussed follow up and return precautions.         Final Clinical Impression(s) / ED Diagnoses Final diagnoses:  IC (interstitial cystitis)    Rx / DC Orders ED Discharge Orders          Ordered    hyoscyamine (LEVSIN/SL) 0.125 MG SL tablet  Every 4 hours PRN,   Status:  Discontinued         05/28/23 1747              Arthor Captain, PA-C 05/30/23 1717    Bethann Berkshire, MD 05/31/23 1020

## 2023-05-28 NOTE — ED Triage Notes (Signed)
 Pt states that she has been having intermittent bladder pain ongoing and recently had a procedure for same. Yesterday she began having RUQ pain and bilateral flank pain as well. States she is having frequent N/V/D. Attempted Phenergan suppository at home but unable to maintain d/t diarrhea.

## 2023-05-28 NOTE — Discharge Instructions (Signed)
 It appears that your symptoms are related to your ICD.  It does not appear that you have any evidence of an infection.  You will need to follow-up with your specialist.  I am discharging you with some antiemetics.  Please call them for further pain management.

## 2023-05-29 ENCOUNTER — Telehealth (HOSPITAL_COMMUNITY): Payer: Self-pay | Admitting: Emergency Medicine

## 2023-05-29 MED ORDER — HYOSCYAMINE SULFATE 0.125 MG SL SUBL: 0.1250 mg | SUBLINGUAL_TABLET | SUBLINGUAL | 1 refills | Status: DC | PRN

## 2023-05-29 NOTE — Telephone Encounter (Cosign Needed)
 Patient had prescription printed yesterday, but was not signed. Re-printed prescription under my name for hyoscyamine.

## 2023-06-08 ENCOUNTER — Encounter: Payer: Self-pay | Admitting: Gastroenterology

## 2023-06-08 ENCOUNTER — Other Ambulatory Visit (HOSPITAL_COMMUNITY): Payer: Self-pay

## 2023-06-08 ENCOUNTER — Other Ambulatory Visit: Payer: Self-pay

## 2023-06-24 ENCOUNTER — Telehealth (HOSPITAL_COMMUNITY): Payer: Self-pay | Admitting: Psychiatry

## 2023-06-24 NOTE — Telephone Encounter (Signed)
 Patient was seen at [facility] in 2024. Patient called to schedule an appointment with [place] office but was referred to [facility] due to insurance. Patient agreed to be seen at [facility] and will call office to get scheduled.

## 2023-06-25 ENCOUNTER — Ambulatory Visit: Payer: MEDICAID | Admitting: Physician Assistant

## 2023-06-25 ENCOUNTER — Telehealth (HOSPITAL_COMMUNITY): Admitting: Psychiatry

## 2023-06-26 ENCOUNTER — Other Ambulatory Visit (HOSPITAL_COMMUNITY): Payer: Self-pay

## 2023-06-29 ENCOUNTER — Emergency Department (HOSPITAL_COMMUNITY)
Admission: EM | Admit: 2023-06-29 | Discharge: 2023-06-29 | Disposition: A | Discharge status: Home / Self Care | Payer: MEDICAID | Attending: Emergency Medicine | Admitting: Emergency Medicine

## 2023-06-29 ENCOUNTER — Encounter (HOSPITAL_COMMUNITY): Payer: Self-pay

## 2023-06-29 DIAGNOSIS — R109 Unspecified abdominal pain: Secondary | ICD-10-CM | POA: Diagnosis present

## 2023-06-29 DIAGNOSIS — N3010 Interstitial cystitis (chronic) without hematuria: Secondary | ICD-10-CM | POA: Insufficient documentation

## 2023-06-29 HISTORY — DX: Interstitial cystitis (chronic) without hematuria: N30.10

## 2023-06-29 HISTORY — DX: Autoimmune thyroiditis: E06.3

## 2023-06-29 LAB — BASIC METABOLIC PANEL WITH GFR
Anion gap: 9 (ref 5–15)
BUN: 16 mg/dL (ref 6–20)
CO2: 26 mmol/L (ref 22–32)
Calcium: 9 mg/dL (ref 8.9–10.3)
Chloride: 102 mmol/L (ref 98–111)
Creatinine, Ser: 0.88 mg/dL (ref 0.44–1.00)
GFR, Estimated: >60 mL/min (ref >60)
Glucose, Bld: 81 mg/dL (ref 70–99)
Potassium: 3.7 mmol/L (ref 3.5–5.1)
Sodium: 137 mmol/L (ref 135–145)

## 2023-06-29 LAB — CBC WITH DIFFERENTIAL/PLATELET
Abs Immature Granulocytes: 0.09 10*3/uL — ABNORMAL HIGH (ref 0.00–0.07)
Basophils Absolute: 0.1 10*3/uL (ref 0.0–0.1)
Basophils Relative: 1 %
Eosinophils Absolute: 0.2 10*3/uL (ref 0.0–0.5)
Eosinophils Relative: 2 %
HCT: 40.7 % (ref 36.0–46.0)
Hemoglobin: 13.3 g/dL (ref 12.0–15.0)
Immature Granulocytes: 1 %
Lymphocytes Relative: 30 %
Lymphs Abs: 2.4 10*3/uL (ref 0.7–4.0)
MCH: 31.2 pg (ref 26.0–34.0)
MCHC: 32.7 g/dL (ref 30.0–36.0)
MCV: 95.5 fL (ref 80.0–100.0)
Monocytes Absolute: 0.7 10*3/uL (ref 0.1–1.0)
Monocytes Relative: 8 %
Neutro Abs: 4.7 10*3/uL (ref 1.7–7.7)
Neutrophils Relative %: 58 %
Platelets: 363 10*3/uL (ref 150–400)
RBC: 4.26 MIL/uL (ref 3.87–5.11)
RDW: 13.4 % (ref 11.5–15.5)
WBC: 8.1 10*3/uL (ref 4.0–10.5)
nRBC: 0 % (ref 0.0–0.2)

## 2023-06-29 LAB — URINALYSIS, W/ REFLEX TO CULTURE (INFECTION SUSPECTED)
Bacteria, UA: NONE SEEN
Bilirubin Urine: NEGATIVE
Glucose, UA: NEGATIVE mg/dL
Ketones, ur: NEGATIVE mg/dL
Leukocytes,Ua: NEGATIVE
Nitrite: NEGATIVE
Protein, ur: NEGATIVE mg/dL
Specific Gravity, Urine: 1.009 (ref 1.005–1.030)
pH: 8 (ref 5.0–8.0)

## 2023-06-29 LAB — PREGNANCY, URINE: Preg Test, Ur: NEGATIVE

## 2023-06-29 MED ORDER — ONDANSETRON 4 MG PO TBDP
4.0000 mg | ORAL_TABLET | Freq: Once | ORAL | Status: AC
  Administered 2023-06-29: 4 mg via ORAL
  Filled 2023-06-29: qty 1

## 2023-06-29 MED ORDER — MORPHINE SULFATE (PF) 4 MG/ML IV SOLN
8.0000 mg | Freq: Once | INTRAVENOUS | Status: AC
  Administered 2023-06-29: 8 mg via INTRAMUSCULAR
  Filled 2023-06-29: qty 2

## 2023-06-29 MED ORDER — SODIUM CHLORIDE 0.9 % IV SOLN
25.0000 mg | Freq: Four times a day (QID) | INTRAVENOUS | Status: DC | PRN
  Administered 2023-06-29: 25 mg via INTRAVENOUS
  Filled 2023-06-29: qty 1

## 2023-06-29 MED ORDER — OXYCODONE-ACETAMINOPHEN 5-325 MG PO TABS: 1.0000 | ORAL_TABLET | Freq: Four times a day (QID) | ORAL | 0 refills | Status: DC | PRN

## 2023-06-29 MED ORDER — MORPHINE SULFATE (PF) 4 MG/ML IV SOLN
4.0000 mg | Freq: Once | INTRAVENOUS | Status: AC
  Administered 2023-06-29: 4 mg via INTRAVENOUS
  Filled 2023-06-29: qty 1

## 2023-06-29 NOTE — ED Provider Notes (Signed)
 Blood pressure (!) 170/115, pulse 70, temperature 98.4 F (36.9 C), temperature source Oral, resp. rate 16, height 5\' 11"  (1.803 m), weight 106.3 kg, last menstrual period 06/22/2023, SpO2 94%, not currently breastfeeding.  Assuming care from Dr. Lewis Red.  In short, Krystal [name] is a 33 y.o. female with a chief complaint of Vomiting and Abdominal Pain .  Refer to the original H&P for additional details.  The current plan of care is to follow up on labs and UA.  05:23 PM  UA without infection. CBC without leukocytosis. No AKI. Patient feeling improved. Will d/c home with short pain med course. PDMP reviewed. Last Rx was in late March.    Roberts Ching, MD 06/29/23 [phone]

## 2023-06-29 NOTE — ED Triage Notes (Signed)
 Pt arriving reporting abdominal pain, vomiting x1 day. States she was seen by urologist this morning for ongoing "bladder pain". Sent by MD. Also reporting increased urination and dyuria

## 2023-06-29 NOTE — Discharge Instructions (Signed)
 Please continue to follow with your PCP and Urology teams. Return with any new or suddenly worsening symptoms.

## 2023-06-29 NOTE — ED Provider Notes (Signed)
 [place] EMERGENCY DEPARTMENT AT [facility] Provider Note   CSN: [id number] Arrival date & time: 06/29/23  1236     History  Chief Complaint  Patient presents with   Vomiting   Abdominal Pain    Krystal [name] is a 33 y.o. female.  HPI    33 year old female comes in with chief complaint of abdominal pain, vomiting.  Patient has history of interstitial cystitis. Patient indicates that she was sent here by the urologist because of pain and elevated blood pressure.  Patient has had current abdominal pain, vomiting for the last day.  She has increased urination, discomfort with urination.  Patient denies any associated chest pain, shortness of breath or headache.  Patient's BP is high, she thinks that she took the medicine today but she just vomited them.  Home Medications Prior to Admission medications   Medication Sig Start Date End Date Taking? Authorizing Provider  acetaminophen  (TYLENOL ) 500 MG tablet Take 1,000 mg by mouth 3 (three) times daily as needed for moderate pain (pain score 4-6), headache, fever or mild pain (pain score 1-3).    [provider]  albuterol  (VENTOLIN  HFA) 108 (90 Base) MCG/ACT inhaler Inhale 2 puffs into the lungs every 4 (four) hours as needed for wheezing or shortness of breath. Patient taking differently: Inhale 2 puffs into the lungs as needed for wheezing or shortness of breath. 10/15/22   Trudy Fusi, DO  bupivacaine ,PF, (MARCAINE ) 0.5 % SOLN injection Instill 15 mLs into bladder daily with heparin . Patient not taking: Reported on 04/22/2023 04/06/23     dicyclomine  (BENTYL ) 20 MG tablet Take 1 tablet (20 mg total) by mouth 2 (two) times daily. Patient not taking: Reported on 04/22/2023 03/29/23   Trish Furl, MD  EPINEPHrine  0.3 mg/0.3 mL IJ SOAJ injection Inject 0.3 mg into the muscle as needed for anaphylaxis. 01/15/21   Trudy Fusi, DO  FLUoxetine  (PROZAC ) 20 MG capsule Take 3 capsules (60 mg total) by mouth daily. 05/15/23  07/14/23  Buena Carmine, NP  heparin  10000 UNIT/ML injection Instill 4 mLs (40,000 Units total) into the bladder daily with bupivacaine . Patient not taking: Reported on 04/22/2023 04/06/23     hyoscyamine  (LEVSIN/SL) 0.125 MG SL tablet Place 1 tablet (0.125 mg total) under the tongue every 4 (four) hours as needed (spasm). Up to 1.25 mg daily 05/29/23   Spence Dux, PA-C  irbesartan -hydrochlorothiazide  (AVALIDE) 150-12.5 MG tablet Take 2 tablets by mouth in the morning and at bedtime. 10/06/22 10/06/23  Cathey Clunes, MD  levothyroxine  (SYNTHROID ) 150 MCG tablet Take 1 tablet (150 mcg total) by mouth daily at 6 (six) AM. 10/06/22 10/06/23  Cathey Clunes, MD  linaclotide  (LINZESS ) 145 MCG CAPS capsule Take 1 capsule (145 mcg total) by mouth daily before breakfast. Patient not taking: Reported on 04/22/2023 04/08/23 05/08/23  Dalene Duck, MD  NEEDLE, DISP, 18 G (BD DISP NEEDLES) 18G X 1-1/2" MISC Use as directed with daily bladder instillations 04/06/23     OLANZapine  (ZYPREXA ) 2.5 MG tablet Take 1 tablet (2.5 mg total) by mouth daily. 05/15/23 07/14/23  Buena Carmine, NP  OLANZapine  (ZYPREXA ) 2.5 MG tablet Take 1 tablet (2.5 mg total) by mouth daily. 05/15/23   Buena Carmine, NP  OLANZapine  (ZYPREXA ) 7.5 MG tablet Take 1 tablet (7.5 mg total) by mouth at bedtime. 05/15/23 07/14/23  Buena Carmine, NP  OLANZapine  (ZYPREXA ) 7.5 MG tablet Take 1 tablet (7.5 mg total) by mouth at bedtime. 05/15/23   Raquel Cables,  Alyse July, NP  pantoprazole  (PROTONIX ) 40 MG tablet Take 1 tablet (40 mg total) by mouth 2 (two) times daily. Patient taking differently: Take 40 mg by mouth at bedtime. 03/21/23 04/22/23  Lesa Rape, MD  promethazine  (PHENERGAN ) 25 MG suppository Place 1 suppository (25 mg total) rectally every 6 (six) hours as needed for nausea or vomiting. 04/27/23 05/27/23  Ezenduka, Nkeiruka J, MD  Syringe, Disposable, 20 ML MISC Use as directed for daily bladder instillations 04/06/23      traMADol  (ULTRAM ) 50 MG tablet Take 1 tablet (50 mg total) by mouth every 12 (twelve) hours as needed for moderate pain (pain score 4-6). 04/27/23   Ezenduka, Nkeiruka J, MD      Allergies    Bee venom, Coreg  [carvedilol ], Red dye #40 (allura red), Compazine  [prochlorperazine  maleate], Oxycodone , Vicodin [hydrocodone -acetaminophen ], Cymbalta [duloxetine hcl], Ditropan  [oxybutynin ], Elavil [amitriptyline], Motrin  [ibuprofen ], Naprosyn  [naproxen ], Pyrimethamine, Sulfadoxine, Zestril  [lisinopril ], Nifedical xl [nifedipine ], Reglan  [metoclopramide ], and Trandate  [labetalol ]    Review of Systems   Review of Systems  All other systems reviewed and are negative.   Physical Exam Updated Vital Signs BP (!) 170/115   Pulse 70   Temp 98.4 F (36.9 C) (Oral)   Resp 16   Ht 5\' 11"  (1.803 m)   Wt 106.3 kg   LMP 06/22/2023   SpO2 94%   BMI 32.69 kg/m  Physical Exam Vitals and nursing note reviewed.  Constitutional:      General: She is not in acute distress.    Appearance: She is well-developed. She is ill-appearing.  HENT:     Head: Atraumatic.  Cardiovascular:     Rate and Rhythm: Normal rate.  Pulmonary:     Effort: Pulmonary effort is normal.  Abdominal:     Tenderness: There is generalized abdominal tenderness.  Musculoskeletal:     Cervical back: Normal range of motion and neck supple.  Skin:    General: Skin is warm and dry.  Neurological:     Mental Status: She is alert and oriented to person, place, and time.     ED Results / Procedures / Treatments   Labs (all labs ordered are listed, but only abnormal results are displayed) Labs Reviewed  CBC WITH DIFFERENTIAL/PLATELET - Abnormal; Notable for the following components:      Result Value   Abs Immature Granulocytes 0.09 (*)    All other components within normal limits  URINALYSIS, W/ REFLEX TO CULTURE (INFECTION SUSPECTED) - Abnormal; Notable for the following components:   Hgb urine dipstick SMALL (*)    All other  components within normal limits  PREGNANCY, URINE  BASIC METABOLIC PANEL WITH GFR    EKG None  Radiology No results found.  Procedures Procedures    Medications Ordered in ED Medications  promethazine  (PHENERGAN ) 25 mg in sodium chloride  0.9 % 50 mL IVPB (25 mg Intravenous New Bag/Given 06/29/23 1625)  morphine  (PF) 4 MG/ML injection 8 mg (8 mg Intramuscular Given 06/29/23 1338)  ondansetron  (ZOFRAN -ODT) disintegrating tablet 4 mg (4 mg Oral Given 06/29/23 1338)  morphine  (PF) 4 MG/ML injection 4 mg (4 mg Intravenous Given 06/29/23 1504)    ED Course/ Medical Decision Making/ A&P                                 Medical Decision Making Amount and/or Complexity of Data Reviewed Labs: ordered.  Risk Prescription drug management.   This patient presents to the  ED with chief complaint(s) of abdominal pain, nausea, vomiting and elevated blood pressure with pertinent past medical history of interstitial cystitis, hypertension.The complaint involves an extensive differential diagnosis and also carries with it a high risk of complications and morbidity.    The differential diagnosis includes : Flareup of interstitial cystitis, intractable nausea and vomiting, severe dehydration, electrolyte abnormality, situational hypertension. Low suspicion for acute intra-abdominal process such as appendicitis, diverticulitis, PID/TOA.  The initial plan is to get patients symptoms and controlled with some IM medications and oral hydration.   Additional history obtained: Records reviewed Care Everywhere/External Records  Independent labs interpretation:  The following labs were independently interpreted: CBC, metabolic profile, UA are normal.  Treatment and Reassessment: Patient reassessed after IM medications.  She continues to have pain therefore we have ordered basic labs and IV medications.  Patient care has been signed out to incoming team.   Final Clinical Impression(s) / ED  Diagnoses Final diagnoses:  Interstitial cystitis    Rx / DC Orders ED Discharge Orders     None         Deatra Face, MD 06/29/23 [phone]

## 2023-06-30 ENCOUNTER — Other Ambulatory Visit: Payer: Self-pay

## 2023-06-30 ENCOUNTER — Inpatient Hospital Stay (HOSPITAL_COMMUNITY)
Admission: EM | Admit: 2023-06-30 | Discharge: 2023-07-04 | DRG: 690 | Disposition: A | Discharge status: Home / Self Care | Payer: MEDICAID | Attending: Internal Medicine | Admitting: Internal Medicine

## 2023-06-30 ENCOUNTER — Encounter (HOSPITAL_COMMUNITY): Payer: Self-pay | Admitting: Emergency Medicine

## 2023-06-30 ENCOUNTER — Emergency Department (HOSPITAL_COMMUNITY): Payer: MEDICAID

## 2023-06-30 DIAGNOSIS — E876 Hypokalemia: Secondary | ICD-10-CM | POA: Diagnosis present

## 2023-06-30 DIAGNOSIS — R52 Pain, unspecified: Secondary | ICD-10-CM | POA: Diagnosis present

## 2023-06-30 DIAGNOSIS — R109 Unspecified abdominal pain: Secondary | ICD-10-CM

## 2023-06-30 DIAGNOSIS — N119 Chronic tubulo-interstitial nephritis, unspecified: Principal | ICD-10-CM

## 2023-06-30 DIAGNOSIS — Z8249 Family history of ischemic heart disease and other diseases of the circulatory system: Secondary | ICD-10-CM

## 2023-06-30 DIAGNOSIS — Z885 Allergy status to narcotic agent status: Secondary | ICD-10-CM

## 2023-06-30 DIAGNOSIS — I160 Hypertensive urgency: Secondary | ICD-10-CM | POA: Diagnosis present

## 2023-06-30 DIAGNOSIS — I159 Secondary hypertension, unspecified: Principal | ICD-10-CM | POA: Diagnosis present

## 2023-06-30 DIAGNOSIS — E66811 Obesity, class 1: Secondary | ICD-10-CM | POA: Diagnosis present

## 2023-06-30 DIAGNOSIS — G8929 Other chronic pain: Secondary | ICD-10-CM | POA: Diagnosis present

## 2023-06-30 DIAGNOSIS — Z79890 Hormone replacement therapy (postmenopausal): Secondary | ICD-10-CM

## 2023-06-30 DIAGNOSIS — I1A0 Resistant hypertension: Secondary | ICD-10-CM | POA: Diagnosis present

## 2023-06-30 DIAGNOSIS — R112 Nausea with vomiting, unspecified: Secondary | ICD-10-CM | POA: Diagnosis present

## 2023-06-30 DIAGNOSIS — Z6832 Body mass index (BMI) 32.0-32.9, adult: Secondary | ICD-10-CM

## 2023-06-30 DIAGNOSIS — Z79899 Other long term (current) drug therapy: Secondary | ICD-10-CM

## 2023-06-30 DIAGNOSIS — F419 Anxiety disorder, unspecified: Secondary | ICD-10-CM | POA: Diagnosis present

## 2023-06-30 DIAGNOSIS — E063 Autoimmune thyroiditis: Secondary | ICD-10-CM | POA: Diagnosis present

## 2023-06-30 DIAGNOSIS — Z888 Allergy status to other drugs, medicaments and biological substances status: Secondary | ICD-10-CM

## 2023-06-30 DIAGNOSIS — J45909 Unspecified asthma, uncomplicated: Secondary | ICD-10-CM | POA: Diagnosis present

## 2023-06-30 DIAGNOSIS — Z9102 Food additives allergy status: Secondary | ICD-10-CM

## 2023-06-30 DIAGNOSIS — Z91030 Bee allergy status: Secondary | ICD-10-CM

## 2023-06-30 DIAGNOSIS — E871 Hypo-osmolality and hyponatremia: Secondary | ICD-10-CM | POA: Diagnosis present

## 2023-06-30 DIAGNOSIS — R1030 Lower abdominal pain, unspecified: Secondary | ICD-10-CM | POA: Diagnosis not present

## 2023-06-30 DIAGNOSIS — F603 Borderline personality disorder: Secondary | ICD-10-CM | POA: Diagnosis present

## 2023-06-30 DIAGNOSIS — K589 Irritable bowel syndrome without diarrhea: Secondary | ICD-10-CM | POA: Diagnosis present

## 2023-06-30 DIAGNOSIS — G43909 Migraine, unspecified, not intractable, without status migrainosus: Secondary | ICD-10-CM | POA: Diagnosis present

## 2023-06-30 DIAGNOSIS — Z886 Allergy status to analgesic agent status: Secondary | ICD-10-CM

## 2023-06-30 DIAGNOSIS — E2740 Unspecified adrenocortical insufficiency: Secondary | ICD-10-CM | POA: Diagnosis present

## 2023-06-30 DIAGNOSIS — Z882 Allergy status to sulfonamides status: Secondary | ICD-10-CM

## 2023-06-30 DIAGNOSIS — F32A Depression, unspecified: Secondary | ICD-10-CM | POA: Insufficient documentation

## 2023-06-30 DIAGNOSIS — R102 Pelvic and perineal pain: Secondary | ICD-10-CM | POA: Insufficient documentation

## 2023-06-30 DIAGNOSIS — K219 Gastro-esophageal reflux disease without esophagitis: Secondary | ICD-10-CM | POA: Diagnosis present

## 2023-06-30 LAB — CBC
HCT: 43.3 % (ref 36.0–46.0)
Hemoglobin: 14.3 g/dL (ref 12.0–15.0)
MCH: 31.7 pg (ref 26.0–34.0)
MCHC: 33 g/dL (ref 30.0–36.0)
MCV: 96 fL (ref 80.0–100.0)
Platelets: 390 10*3/uL (ref 150–400)
RBC: 4.51 MIL/uL (ref 3.87–5.11)
RDW: 13.4 % (ref 11.5–15.5)
WBC: 9.5 10*3/uL (ref 4.0–10.5)
nRBC: 0 % (ref 0.0–0.2)

## 2023-06-30 LAB — COMPREHENSIVE METABOLIC PANEL WITH GFR
ALT: 28 U/L (ref 0–44)
AST: 27 U/L (ref 15–41)
Albumin: 4.2 g/dL (ref 3.5–5.0)
Alkaline Phosphatase: 62 U/L (ref 38–126)
Anion gap: 12 (ref 5–15)
BUN: 16 mg/dL (ref 6–20)
CO2: 26 mmol/L (ref 22–32)
Calcium: 9.4 mg/dL (ref 8.9–10.3)
Chloride: 99 mmol/L (ref 98–111)
Creatinine, Ser: 0.83 mg/dL (ref 0.44–1.00)
GFR, Estimated: >60 mL/min (ref >60)
Glucose, Bld: 89 mg/dL (ref 70–99)
Potassium: 3.7 mmol/L (ref 3.5–5.1)
Sodium: 137 mmol/L (ref 135–145)
Total Bilirubin: 0.6 mg/dL (ref 0.0–1.2)
Total Protein: 7.6 g/dL (ref 6.5–8.1)

## 2023-06-30 LAB — LIPASE, BLOOD: Lipase: 27 U/L (ref 11–51)

## 2023-06-30 MED ORDER — HYDROMORPHONE HCL 1 MG/ML IJ SOLN
0.5000 mg | INTRAMUSCULAR | Status: DC | PRN
  Administered 2023-06-30: 0.5 mg via INTRAVENOUS
  Filled 2023-06-30: qty 1

## 2023-06-30 MED ORDER — TRAMADOL HCL 50 MG PO TABS
50.0000 mg | ORAL_TABLET | Freq: Two times a day (BID) | ORAL | Status: DC | PRN
  Administered 2023-06-30: 50 mg via ORAL
  Filled 2023-06-30 (×2): qty 1

## 2023-06-30 MED ORDER — ENOXAPARIN SODIUM 40 MG/0.4ML IJ SOSY
40.0000 mg | PREFILLED_SYRINGE | Freq: Every day | INTRAMUSCULAR | Status: DC
  Administered 2023-06-30 – 2023-07-03 (×4): 40 mg via SUBCUTANEOUS
  Filled 2023-06-30 (×4): qty 0.4

## 2023-06-30 MED ORDER — LACTATED RINGERS IV SOLN: INTRAVENOUS | Status: AC

## 2023-06-30 MED ORDER — PANTOPRAZOLE SODIUM 40 MG PO TBEC
40.0000 mg | DELAYED_RELEASE_TABLET | Freq: Two times a day (BID) | ORAL | Status: DC | PRN
  Administered 2023-06-30 – 2023-07-01 (×2): 40 mg via ORAL
  Filled 2023-06-30 (×2): qty 1

## 2023-06-30 MED ORDER — LACTATED RINGERS IV SOLN: INTRAVENOUS | Status: DC

## 2023-06-30 MED ORDER — SODIUM CHLORIDE 0.9 % IV SOLN
12.5000 mg | INTRAVENOUS | Status: AC
  Administered 2023-06-30: 12.5 mg via INTRAVENOUS
  Filled 2023-06-30: qty 12.5

## 2023-06-30 MED ORDER — HYDRALAZINE HCL 20 MG/ML IJ SOLN
10.0000 mg | Freq: Four times a day (QID) | INTRAMUSCULAR | Status: DC | PRN
  Administered 2023-07-01: 10 mg via INTRAVENOUS
  Filled 2023-06-30: qty 1

## 2023-06-30 MED ORDER — SODIUM CHLORIDE 0.9% FLUSH: 10.0000 mL | INTRAVENOUS | Status: DC | PRN

## 2023-06-30 MED ORDER — HYDROMORPHONE HCL 1 MG/ML IJ SOLN
1.0000 mg | Freq: Once | INTRAMUSCULAR | Status: AC
  Administered 2023-06-30: 1 mg via INTRAVENOUS
  Filled 2023-06-30: qty 1

## 2023-06-30 MED ORDER — HYDROCORTISONE 10 MG PO TABS
10.0000 mg | ORAL_TABLET | Freq: Every day | ORAL | Status: DC
  Administered 2023-06-30: 10 mg via ORAL
  Filled 2023-06-30: qty 1

## 2023-06-30 MED ORDER — RASPBERRY PO SYRP: ORAL_SOLUTION | ORAL | Status: DC

## 2023-06-30 MED ORDER — ALBUTEROL SULFATE HFA 108 (90 BASE) MCG/ACT IN AERS: 2.0000 | INHALATION_SPRAY | RESPIRATORY_TRACT | Status: DC | PRN

## 2023-06-30 MED ORDER — IRBESARTAN 150 MG PO TABS
150.0000 mg | ORAL_TABLET | ORAL | Status: AC
  Administered 2023-06-30: 150 mg via ORAL
  Filled 2023-06-30: qty 1

## 2023-06-30 MED ORDER — HYDROXYZINE HCL 25 MG PO TABS
50.0000 mg | ORAL_TABLET | Freq: Every day | ORAL | Status: DC
  Administered 2023-06-30 – 2023-07-03 (×4): 50 mg via ORAL
  Filled 2023-06-30 (×4): qty 2

## 2023-06-30 MED ORDER — HYDROCHLOROTHIAZIDE 25 MG PO TABS
25.0000 mg | ORAL_TABLET | Freq: Two times a day (BID) | ORAL | Status: DC
Start: 2023-06-30 — End: 2023-07-01
  Administered 2023-06-30 – 2023-07-01 (×2): 25 mg via ORAL
  Filled 2023-06-30 (×2): qty 1

## 2023-06-30 MED ORDER — HYDROMORPHONE HCL 1 MG/ML IJ SOLN
1.0000 mg | INTRAMUSCULAR | Status: DC | PRN
  Administered 2023-06-30 – 2023-07-01 (×3): 1 mg via INTRAVENOUS
  Filled 2023-06-30 (×3): qty 1

## 2023-06-30 MED ORDER — SODIUM CHLORIDE 0.9% FLUSH
10.0000 mL | Freq: Two times a day (BID) | INTRAVENOUS | Status: DC
  Administered 2023-06-30 – 2023-07-01 (×2): 10 mL
  Administered 2023-07-01: 20 mL
  Administered 2023-07-02: 10 mL
  Administered 2023-07-02: 20 mL
  Administered 2023-07-03 – 2023-07-04 (×3): 10 mL

## 2023-06-30 MED ORDER — LINACLOTIDE 145 MCG PO CAPS
145.0000 ug | ORAL_CAPSULE | Freq: Every day | ORAL | Status: DC
  Administered 2023-07-01 – 2023-07-04 (×4): 145 ug via ORAL
  Filled 2023-06-30 (×4): qty 1

## 2023-06-30 MED ORDER — OLANZAPINE 5 MG PO TABS
7.5000 mg | ORAL_TABLET | Freq: Every day | ORAL | Status: DC
  Administered 2023-06-30: 7.5 mg via ORAL
  Filled 2023-06-30: qty 2

## 2023-06-30 MED ORDER — HYDROCORTISONE 10 MG PO TABS
30.0000 mg | ORAL_TABLET | Freq: Every day | ORAL | Status: DC
  Administered 2023-07-01: 30 mg via ORAL
  Filled 2023-06-30: qty 3

## 2023-06-30 MED ORDER — IRBESARTAN 150 MG PO TABS
300.0000 mg | ORAL_TABLET | Freq: Two times a day (BID) | ORAL | Status: DC
  Administered 2023-06-30 – 2023-07-04 (×8): 300 mg via ORAL
  Filled 2023-06-30 (×8): qty 2

## 2023-06-30 MED ORDER — ONDANSETRON HCL 4 MG/2ML IJ SOLN
4.0000 mg | Freq: Four times a day (QID) | INTRAMUSCULAR | Status: DC | PRN
  Administered 2023-06-30 – 2023-07-03 (×4): 4 mg via INTRAVENOUS
  Filled 2023-06-30 (×4): qty 2

## 2023-06-30 MED ORDER — OLANZAPINE 5 MG PO TABS
2.5000 mg | ORAL_TABLET | Freq: Every day | ORAL | Status: DC
  Administered 2023-07-01: 2.5 mg via ORAL
  Filled 2023-06-30: qty 1

## 2023-06-30 MED ORDER — ONDANSETRON HCL 4 MG/2ML IJ SOLN
4.0000 mg | Freq: Once | INTRAMUSCULAR | Status: AC
  Administered 2023-06-30: 4 mg via INTRAVENOUS
  Filled 2023-06-30: qty 2

## 2023-06-30 MED ORDER — LEVOTHYROXINE SODIUM 150 MCG PO TABS
150.0000 ug | ORAL_TABLET | Freq: Every day | ORAL | Status: DC
  Administered 2023-07-01 – 2023-07-04 (×4): 150 ug via ORAL
  Filled 2023-06-30 (×4): qty 2

## 2023-06-30 MED ORDER — IOHEXOL 300 MG/ML  SOLN
100.0000 mL | Freq: Once | INTRAMUSCULAR | Status: AC | PRN
  Administered 2023-06-30: 100 mL via INTRAVENOUS

## 2023-06-30 MED ORDER — LACTATED RINGERS IV BOLUS
1000.0000 mL | Freq: Once | INTRAVENOUS | Status: AC
  Administered 2023-06-30: 1000 mL via INTRAVENOUS

## 2023-06-30 MED ORDER — ALBUTEROL SULFATE (2.5 MG/3ML) 0.083% IN NEBU: 2.5000 mg | INHALATION_SOLUTION | RESPIRATORY_TRACT | Status: DC | PRN

## 2023-06-30 MED ORDER — HYDROMORPHONE HCL 1 MG/ML IJ SOLN
0.5000 mg | Freq: Once | INTRAMUSCULAR | Status: AC
  Administered 2023-06-30: 0.5 mg via INTRAVENOUS
  Filled 2023-06-30: qty 1

## 2023-06-30 MED ORDER — IRBESARTAN-HYDROCHLOROTHIAZIDE 150-12.5 MG PO TABS: 2.0000 | ORAL_TABLET | Freq: Two times a day (BID) | ORAL | Status: DC

## 2023-06-30 MED ORDER — HYDROCHLOROTHIAZIDE 12.5 MG PO TABS
12.5000 mg | ORAL_TABLET | Freq: Once | ORAL | Status: AC
  Administered 2023-06-30: 12.5 mg via ORAL
  Filled 2023-06-30: qty 1

## 2023-06-30 MED ORDER — FLUOXETINE HCL 20 MG PO CAPS
60.0000 mg | ORAL_CAPSULE | Freq: Every day | ORAL | Status: DC
  Administered 2023-06-30 – 2023-07-04 (×5): 60 mg via ORAL
  Filled 2023-06-30 (×5): qty 3

## 2023-06-30 MED ORDER — ONDANSETRON HCL 4 MG PO TABS: 4.0000 mg | ORAL_TABLET | Freq: Four times a day (QID) | ORAL | Status: DC | PRN

## 2023-06-30 NOTE — ED Triage Notes (Signed)
 Pt reports hypertension, headache, right eye blurred vision, and vomiting. Pt reports she has been unable to take her BP medication due to vomiting.

## 2023-06-30 NOTE — ED Provider Notes (Signed)
 [place] [facility]ENCY DEPARTMENT AT [facility] Provider Note   CSN: [id number] Arrival date & time: 06/30/23  1004     History  Chief Complaint  Patient presents with   Hypertension    Krystal [name] is a 33 y.o. female.  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  Patient was seen yesterday in the emergency department for pain from her interstitial cystitis as well as hypertension.  Had her pain nausea treated.  Went home and had persistence of abdominal pain and nausea and vomiting.  Tried Phenergan  suppository as well as some other antiemetics that she had at home without relief.  Also reports that she is having a severe headache and that her vision seems more blurry bilaterally.  Has been unable to take any of her medications.  Has a history of cholecystectomy, appendectomy, and C-sections.  Does smoke THC several times a week as well.       Home Medications Prior to Admission medications   Medication Sig Start Date End Date Taking? Authorizing Provider  acetaminophen  (TYLENOL ) 500 MG tablet Take 1,000 mg by mouth 3 (three) times daily as needed (for pain or headaches).   Yes [provider]  albuterol  (VENTOLIN  HFA) 108 (90 Base) MCG/ACT inhaler Inhale 2 puffs into the lungs every 4 (four) hours as needed for wheezing or shortness of breath. 10/15/22  Yes Trudy Fusi, DO  BENADRYL  ALLERGY 25 MG capsule Take 25 mg by mouth every 6 (six) hours as needed for allergies or itching.   Yes [provider]  bupivacaine ,PF, (MARCAINE ) 0.5 % SOLN injection Instill 15 mLs into bladder daily with heparin . 04/06/23  Yes   EPINEPHrine  0.3 mg/0.3 mL IJ SOAJ injection Inject 0.3 mg into  the muscle as needed for anaphylaxis. 01/15/21  Yes Trudy Fusi, DO  FLUoxetine  (PROZAC ) 20 MG capsule Take 3 capsules (60 mg total) by mouth daily. 05/15/23 07/14/23 Yes Buena Carmine, NP  heparin  46962 UNIT/ML injection Instill 4 mLs (40,000 Units total) into the bladder daily with bupivacaine . 04/06/23  Yes   hydrocortisone  (CORTEF ) 10 MG tablet Take 10-30 mg by mouth See admin instructions. Take 30 mg by mouth in the morning and 10 mg at bedtime   Yes [provider]  hydrOXYzine  (ATARAX ) 25 MG tablet Take 50 mg by mouth at bedtime.   Yes [provider]  irbesartan -hydrochlorothiazide  (AVALIDE) 150-12.5 MG tablet Take 2 tablets by mouth in the morning and at bedtime. 10/06/22 10/06/23 Yes Cathey Clunes, MD  levothyroxine  (SYNTHROID ) 150 MCG tablet Take 1 tablet (150 mcg total) by mouth daily at 6 (six) AM. 10/06/22 10/06/23 Yes Cathey Clunes, MD  linaclotide  (LINZESS ) 145 MCG CAPS capsule Take 1 capsule (145 mcg total) by mouth daily before breakfast. 04/08/23 06/30/23 Yes Dalene Duck, MD  NON FORMULARY Take 1 capsule by mouth See admin instructions. Beef organ capsules- Take 1 capsule by mouth once a day   Yes [provider]  OLANZapine  (ZYPREXA ) 2.5 MG tablet Take 1 tablet (2.5 mg total) by mouth daily. 05/15/23  Yes Buena Carmine, NP  OLANZapine  (ZYPREXA ) 7.5 MG tablet Take 1 tablet (7.5 mg total) by mouth at bedtime. 05/15/23 07/14/23 Yes Buena Carmine, NP  ondansetron  (ZOFRAN -ODT) 4 MG disintegrating tablet Take 4 mg  by mouth every 6 (six) hours as needed for nausea or vomiting (dissolve orally).   Yes [provider]  pantoprazole  (PROTONIX ) 40 MG tablet Take 1 tablet (40 mg total) by mouth 2 (two) times daily. Patient taking differently: Take 40 mg by mouth 2 (two) times daily as needed (for heartburn). 03/21/23 06/30/23 Yes Lesa Rape, MD  promethazine  (PHENERGAN ) 25 MG suppository Place 1 suppository (25 mg total) rectally every  6 (six) hours as needed for nausea or vomiting. 04/27/23 06/30/23 Yes Ezenduka, Nkeiruka J, MD  RASPBERRY PO Take 2 capsules by mouth See admin instructions. Raspberry Leaf capsules- Take 2 capsules by mouth once a day   Yes [provider]  traMADol  (ULTRAM ) 50 MG tablet Take 1 tablet (50 mg total) by mouth every 12 (twelve) hours as needed for moderate pain (pain score 4-6). 04/27/23  Yes Ezenduka, Nkeiruka J, MD  dicyclomine  (BENTYL ) 20 MG tablet Take 1 tablet (20 mg total) by mouth 2 (two) times daily. Patient not taking: Reported on 04/22/2023 03/29/23   Trish Furl, MD  hyoscyamine  (LEVSIN/SL) 0.125 MG SL tablet Place 1 tablet (0.125 mg total) under the tongue every 4 (four) hours as needed (spasm). Up to 1.25 mg daily Patient not taking: Reported on 06/30/2023 05/29/23   Spence Dux, PA-C  NEEDLE, DISP, 18 G (BD DISP NEEDLES) 18G X 1-1/2" MISC Use as directed with daily bladder instillations 04/06/23     oxyCODONE -acetaminophen  (PERCOCET/ROXICET) 5-325 MG tablet Take 1 tablet by mouth every 6 (six) hours as needed for severe pain (pain score 7-10). Patient not taking: Reported on 06/30/2023 06/29/23   Roberts Ching, MD  Syringe, Disposable, 20 ML MISC Use as directed for daily bladder instillations 04/06/23         Allergies    Bee venom, Coreg  [carvedilol ], Red dye #40 (allura red), Compazine  [prochlorperazine  maleate], Oxycodone , Vicodin [hydrocodone -acetaminophen ], Cymbalta [duloxetine hcl], Ditropan  [oxybutynin ], Elavil [amitriptyline], Motrin  [ibuprofen ], Naprosyn  [naproxen ], Pyrimethamine, Sulfadoxine, Zestril  [lisinopril ], Nifedical xl [nifedipine ], Reglan  [metoclopramide ], and Trandate  [labetalol ]    Review of Systems   Review of Systems  Physical Exam Updated Vital Signs BP (!) 173/110 (BP Location: Right Arm)   Pulse 70   Temp 98.5 F (36.9 C)   Resp 16   Ht 5\' 11"  (1.803 m)   Wt 106.3 kg   LMP 06/22/2023 Comment: pregnancy waiver formed signed 06-30-2023  SpO2 97%   BMI 32.69  kg/m  Physical Exam Vitals and nursing note reviewed.  Constitutional:      General: She is not in acute distress.    Appearance: She is well-developed.  HENT:     Head: Normocephalic and atraumatic.     Right Ear: External ear normal.     Left Ear: External ear normal.     Nose: Nose normal.  Eyes:     Extraocular Movements: Extraocular movements intact.     Conjunctiva/sclera: Conjunctivae normal.     Pupils: Pupils are equal, round, and reactive to light.     Comments: Pulmonary evaluation reactive bilaterally.  No visual field cuts. right eye visual acuity is 20/40 and left eye visual acuity is 20/30.  Cardiovascular:     Rate and Rhythm: Normal rate and regular rhythm.     Heart sounds: No murmur heard. Pulmonary:     Effort: Pulmonary effort is normal. No respiratory distress.     Breath sounds: Normal breath sounds.  Abdominal:     General: Abdomen is flat. There is no distension.  Palpations: Abdomen is soft. There is no mass.     Tenderness: There is abdominal tenderness (Suprapubic and right lower quadrant). There is no guarding.  Musculoskeletal:     Cervical back: Normal range of motion and neck supple.     Right lower leg: No edema.     Left lower leg: No edema.     Comments: Left upper extremity amputation  Skin:    General: Skin is warm and dry.  Neurological:     Mental Status: She is alert and oriented to person, place, and time. Mental status is at baseline.     Cranial Nerves: No cranial nerve deficit.     Sensory: No sensory deficit.     Motor: No weakness.  Psychiatric:        Mood and Affect: Mood normal.     ED Results / Procedures / Treatments   Labs (all labs ordered are listed, but only abnormal results are displayed) Labs Reviewed  LIPASE, BLOOD  COMPREHENSIVE METABOLIC PANEL WITH GFR  CBC    EKG EKG Interpretation Date/Time:  Tuesday Jun 30 2023 13:54:53 EDT Ventricular Rate:  66 PR Interval:  155 QRS Duration:  123 QT  Interval:  419 QTC Calculation: 439 R Axis:   44  Text Interpretation: Sinus rhythm Probable left ventricular hypertrophy Confirmed by Shyrl Doyne [phone]) on 06/30/2023 2:11:16 PM  Radiology CT Head Wo Contrast Result Date: 06/30/2023 CLINICAL DATA:  headache, vision changes, htn. EXAM: CT HEAD WITHOUT CONTRAST TECHNIQUE: Contiguous axial images were obtained from the base of the skull through the vertex without intravenous contrast. RADIATION DOSE REDUCTION: This exam was performed according to the departmental dose-optimization program which includes automated exposure control, adjustment of the mA and/or kV according to patient size and/or use of iterative reconstruction technique. COMPARISON:  MRI head from 05/04/2022. FINDINGS: Brain: No evidence of acute infarction, hemorrhage, hydrocephalus, extra-axial collection or mass lesion/mass effect. Ventricles are normal. Cerebral volume is age appropriate. Vascular: No hyperdense vessel or unexpected calcification. Skull: Normal. Negative for fracture or focal lesion. Sinuses/Orbits: No acute finding. Other: Visualized mastoid air cells are unremarkable. No mastoid effusion. IMPRESSION: *No acute intracranial abnormality. Electronically Signed   By: Beula Brunswick M.D.   On: 06/30/2023 13:36   CT ABDOMEN PELVIS W CONTRAST Result Date: 06/30/2023 CLINICAL DATA:  abdominal pain, n/v. EXAM: CT ABDOMEN AND PELVIS WITH CONTRAST TECHNIQUE: Multidetector CT imaging of the abdomen and pelvis was performed using the standard protocol following bolus administration of intravenous contrast. RADIATION DOSE REDUCTION: This exam was performed according to the departmental dose-optimization program which includes automated exposure control, adjustment of the mA and/or kV according to patient size and/or use of iterative reconstruction technique. CONTRAST:  OMNIPAQUE  IOHEXOL  300 MG/ML  SOLN COMPARISON:  CT scan abdomen and pelvis from 03/13/2023. FINDINGS: Lower  chest: There are dependent changes in the visualized lung bases. No overt consolidation. No pleural effusion. The heart is normal in size. No pericardial effusion. Hepatobiliary: The liver is normal in size. Non-cirrhotic configuration. No suspicious mass. No intrahepatic or extrahepatic bile duct dilation. Gallbladder is surgically absent. Pancreas: Unremarkable. No pancreatic ductal dilatation or surrounding inflammatory changes. Spleen: Lobulated spleen however, size within normal limits. No focal mass. Adrenals/Urinary Tract: Adrenal glands are unremarkable. No suspicious renal mass. There is a subcentimeter sized hypoattenuating focus in the left kidney lower pole, which is too small to adequately characterize. No hydronephrosis. No renal or ureteric calculi. Urinary bladder is under distended, precluding optimal assessment. However, no  large mass or stones identified. No perivesical fat stranding. Stomach/Bowel: There is a small sliding hiatal hernia. No disproportionate dilation of the small or large bowel loops. No evidence of abnormal bowel wall thickening or inflammatory changes. The appendix was not visualized; however there is no acute inflammatory process in the right lower quadrant. Vascular/Lymphatic: No ascites or pneumoperitoneum. No abdominal or pelvic lymphadenopathy, by size criteria. No aneurysmal dilation of the major abdominal arteries. Reproductive: The uterus is unremarkable. The ovaries are unremarkable. Other: There is a tiny fat containing umbilical hernia. The soft tissues and abdominal wall are otherwise unremarkable. Musculoskeletal: No suspicious osseous lesions. There are mild multilevel degenerative changes in the visualized spine. L5 limbus vertebra noted. IMPRESSION: 1. No acute inflammatory process identified within the abdomen or pelvis. 2. Multiple other nonacute observations, as described above. Electronically Signed   By: Beula Brunswick M.D.   On: 06/30/2023 13:35     Procedures Procedures   Ultrasound Guided Peripheral IV Indication: difficult to access - nursing staff unable to secure adequate peripheral IV access Location: R Antecubital Catheter Size: 20 G  Static views used to identify the target vein then usual prep with Chloraprep. IV placed on first attempt under dynamic US  guidance. Dark red flash noted, and catheter advanced smoothly into the vein. NS saline flushed without resistance, witnessed swelling, or patient discomfort. IV secured with I-site and tegaderm. The patient tolerated the procedure well. Performed By: Shyrl Doyne MD   Medications Ordered in ED Medications  enoxaparin  (LOVENOX ) injection 40 mg (has no administration in time range)  ondansetron  (ZOFRAN ) tablet 4 mg (has no administration in time range)    Or  ondansetron  (ZOFRAN ) injection 4 mg (has no administration in time range)  lactated ringers  infusion ( Intravenous Handoff 06/30/23 1714)  traMADol  (ULTRAM ) tablet 50 mg (has no administration in time range)  HYDROmorphone  (DILAUDID ) injection 1 mg (has no administration in time range)  FLUoxetine  (PROZAC ) capsule 60 mg (60 mg Oral Given 06/30/23 2009)  hydrocortisone  (CORTEF ) tablet 30 mg (has no administration in time range)  hydrOXYzine  (ATARAX ) tablet 50 mg (has no administration in time range)  levothyroxine  (SYNTHROID ) tablet 150 mcg (has no administration in time range)  linaclotide  (LINZESS ) capsule 145 mcg (has no administration in time range)  OLANZapine  (ZYPREXA ) tablet 2.5 mg (has no administration in time range)  OLANZapine  (ZYPREXA ) tablet 7.5 mg (has no administration in time range)  pantoprazole  (PROTONIX ) EC tablet 40 mg (40 mg Oral Given 06/30/23 2009)  albuterol  (PROVENTIL ) (2.5 MG/3ML) 0.083% nebulizer solution 2.5 mg (has no administration in time range)  hydrocortisone  (CORTEF ) tablet 10 mg (has no administration in time range)  irbesartan  (AVAPRO ) tablet 300 mg (has no administration in time  range)    And  hydrochlorothiazide  (HYDRODIURIL ) tablet 25 mg (has no administration in time range)  promethazine  (PHENERGAN ) 12.5 mg in sodium chloride  0.9 % 50 mL IVPB (0 mg Intravenous Stopped 06/30/23 1224)  HYDROmorphone  (DILAUDID ) injection 1 mg (1 mg Intravenous Given 06/30/23 1203)  lactated ringers  bolus 1,000 mL (1,000 mLs Intravenous New Bag/Given 06/30/23 1345)  iohexol  (OMNIPAQUE ) 300 MG/ML solution 100 mL (100 mLs Intravenous Contrast Given 06/30/23 1315)  HYDROmorphone  (DILAUDID ) injection 0.5 mg (0.5 mg Intravenous Given 06/30/23 1345)  irbesartan  (AVAPRO ) tablet 150 mg (150 mg Oral Given 06/30/23 1439)  hydrochlorothiazide  (HYDRODIURIL ) tablet 12.5 mg (12.5 mg Oral Given 06/30/23 1439)  ondansetron  (ZOFRAN ) injection 4 mg (4 mg Intravenous Given 06/30/23 1544)    ED Course/ Medical Decision Making/ A&P Clinical  Course as of 06/30/23 2014  Tue Jun 30, 2023  1313 Patient ports that vision and pain are improving.  Blood pressure has improved and is now in the 160s systolic upon my reevaluation [RP]  1549 Dr Yvonne Hering to evaluate for admission. [RP]    Clinical Course User Index [RP] Ninetta Basket, MD                                 Medical Decision Making Amount and/or Complexity of Data Reviewed Labs: ordered. Radiology: ordered.  Risk Prescription drug management. Decision regarding hospitalization.   Krystal [name] is a 33 y.o. 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   Initial Ddx:  Hypertensive emergency, ICH, stroke, nausea vomiting, interstitial cystitis, pyelonephritis, bowel obstruction, cannabinoid hyperemesis  MDM/Course:  Patient presents  to the emergency department with nausea and vomiting and abdominal pain.  Also has headache and vision changes.  On arrival is markedly hypertensive in the 200s.  No significant changes in her vision.  Based on a note from 06/2021 at the ophthalmologist it appears that her right eye visual acuity is 20/100 and left eye visual acuity is 20/50.  Today her right eye visual acuity is 20/40 and left eye visual acuity is 20/30.  No other focal neurologic deficits.  Treat the patient's pain and nausea and upon re-evaluation blood pressure had spontaneously improved.  She underwent a head CT as well as a CT of the abdomen pelvis that did not show any acute abnormalities.  Patient reports that her vision was improving as well.  At this time low concern for hypertensive emergency that is ongoing but do feel that she is at high risk given her history.  Also is at higher risk because of her nausea and vomiting and the fact that she has not been able to tolerate her home antihypertensives.  Will go ahead and admit to hospitalist for further management.  This patient presents to the ED for concern of complaints listed in HPI, this involves an extensive number of treatment options, and is a complaint that carries with it a high risk of complications and morbidity. Disposition including potential need for admission considered.   Dispo: Admit to Floor  Records reviewed ED Visit Notes The following labs were independently interpreted: Chemistry and show no acute abnormality I independently reviewed the following imaging with scope of interpretation limited to determining acute life threatening conditions related to emergency care: CT Head and agree with the radiologist interpretation with the following exceptions: none I personally reviewed and interpreted cardiac monitoring: normal sinus rhythm  I personally reviewed and interpreted the pt's EKG: see above for interpretation  I have reviewed the patients home medications  and made adjustments as needed Consults: Hospitalist  Portions of this note were generated with Scientist, clinical (histocompatibility and immunogenetics). Dictation errors may occur despite best attempts at proofreading.     Final Clinical Impression(s) / ED Diagnoses Final diagnoses:  Secondary hypertension  Nausea and vomiting, unspecified vomiting type  Abdominal pain, unspecified abdominal location    Rx / DC Orders ED Discharge Orders     None         Ninetta Basket, MD 06/30/23 2014

## 2023-06-30 NOTE — ED Provider Notes (Signed)
 Patient had lab work and hCG performed yesterday.  With her normal renal function and negative pregnancy test can wait for labs for CT today.   Ninetta Basket, MD 06/30/23 [phone]

## 2023-06-30 NOTE — Progress Notes (Signed)

## 2023-06-30 NOTE — H&P (Signed)
 History and Physical  SHAMEL LAUWERS WNU:[id number] DOB: [birth year] DOA: 06/30/2023  PCP: Patient, No Pcp Per   Chief Complaint: Abdominal pain, nausea and vomiting  HPI: Krystal [name] is a 33 y.o. 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 Patient reports she was evaluated by her urologist for her chronic interstitial nephritis yesterday, 5/5. They advised her to present today ED due to her persistent abdominal pain and significantly elevated BP. Patient was discharged home from the ED due to normal workup with UA, CBC and BMP. Patient reports having persistent nausea and vomiting with inability to keep her medications down since being discharged from the ED. She continues to have persistent lower abdominal pain with dysuria, headache and blurry vision.  She denies any fevers, chills, shortness of breath, chest pain, dizziness or hematuria. She has not been able to tolerate any p.o. intake with her last meal being 2 days ago. Her BP was significantly elevated to the 200s/120s so she returned to the ED. after receiving IV Phenergan , she is now able to keep down a some ginger ale if she sips it slowly.  ED Course: Initial vitals showed temp 98.2, RR 16, HR 90, BP 213/151, SpO2 100% on room air.  Repeat labs showed lipase 27, normal CMP and normal CBC. UA from yesterday with no signs of infection.  Patient received IV Dilaudid , IV LR 1 L bolus, IV Phenergan  and her home BP meds (Irbesartan  and hydrochlorothiazide ).  TRH was consulted for admission.   Review of Systems: Please see HPI for pertinent positives and negatives. A complete 10 system review of systems are otherwise negative.  Past Medical History:  Diagnosis Date    Acute acalculous cholecystitis s/p lap cholecystectomy 05/11/2020 05/11/2020   Angio-edema 01/15/2021   Anxiety    Asthma    Chronic interstitial cystitis    Depression    HELLP (hemolytic anemia/elev liver enzymes/low platelets in pregnancy) 12/08/2017   Hemorrhagic cyst of left ovary 04/15/2022   History of borderline personality disorder    History of HELLP syndrome, currently pregnant 04/21/2019   Formatting of this note might be different from the original.  History of HELLP syndrome at 26 weeks 5 days (12/07/2017); admitted to [facility] with BP 200/120s, Plt 52, AST/ALT 471/375  APS evaluation negative (04/2019)   Daily ASA 81 mg   Challenging blood pressure control in current pregnancy      Plan:  - Baseline pre-eclampsia labs this pregnancy within normal limits (Cr: 0.71; urine p:c 0.106)  -    Hypothyroidism due to Hashimoto's thyroiditis    IC (interstitial cystitis)    Kidney stones    Migraines    Necrotizing fasciitis (HCC)    left hand amputation   PTSD (post-traumatic stress disorder)    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   Severe uncontrolled hypertension 10/08/2017   UTI (urinary tract infection)    Past Surgical History:  Procedure Laterality Date   APPENDECTOMY     BIOPSY  05/10/2020   Procedure: BIOPSY;  Surgeon: Evangeline Hilts, MD;  Location: [facility] ENDOSCOPY;  Service: Endoscopy;;   BIOPSY  05/02/2022   Procedure: BIOPSY;  Surgeon: Ace Holder, MD;  Location: [facility] ENDOSCOPY;  Service: Gastroenterology;;   CESAREAN SECTION N/A 12/06/2017   Procedure: CESAREAN SECTION;  Surgeon: Jan Mcgill, MD;  Location:  [facility];  Service: Obstetrics;  Laterality: N/A;   CESAREAN SECTION  07/21/2021   Procedure: CESAREAN SECTION;  Surgeon: Granville Layer, MD;  Location: [facility] LD ORS;  Service: Obstetrics;;   CHOLECYSTECTOMY N/A 05/11/2020   Procedure: SINGLE SITE LAPAROSCOPIC CHOLECYSTECTOMY AND LIVER BIOSY;  Surgeon: Candyce Champagne, MD;  Location: [facility] ORS;  Service:  General;  Laterality: N/A;   ESOPHAGOGASTRODUODENOSCOPY (EGD) WITH PROPOFOL  N/A 05/10/2020   Procedure: ESOPHAGOGASTRODUODENOSCOPY (EGD) WITH PROPOFOL ;  Surgeon: Evangeline Hilts, MD;  Location: [facility] ENDOSCOPY;  Service: Endoscopy;  Laterality: N/A;   FLEXIBLE SIGMOIDOSCOPY N/A 05/02/2022   Procedure: FLEXIBLE SIGMOIDOSCOPY;  Surgeon: Ace Holder, MD;  Location: [facility] ENDOSCOPY;  Service: Gastroenterology;  Laterality: N/A;   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 She has never used smokeless tobacco. She reports current alcohol use. She reports that she does not use drugs.  Allergies  Allergen Reactions   Bee Venom Anaphylaxis and Swelling    Swelling of face, throat. Airway restriction.   Coreg  [Carvedilol ] Shortness Of Breath and Other (See Comments)    Triggers asthma   Red Dye #40 (Allura Red) Other (See Comments)    Migraines   Compazine  [Prochlorperazine  Maleate] Other (See Comments)    Twitching Able to tolerate promethazine    Oxycodone  Nausea And Vomiting    Not allergic per pt.    Vicodin [Hydrocodone -Acetaminophen ] Itching, Nausea And Vomiting and Other (See Comments)    OK with Tylenol    Cymbalta [Duloxetine Hcl] Other (See Comments)    Irritability  Syncope    Ditropan  [Oxybutynin ] Other (See Comments)    Severe dry mouth   Elavil [Amitriptyline] Other (See Comments)    Irritability    Motrin  [Ibuprofen ] Other (See Comments)    Heartburn    Naprosyn  [Naproxen ] Other (See  Comments)    Heartburn    Pyrimethamine Other (See Comments)    From Fansidar combination drug Triggers asthma   Sulfadoxine Other (See Comments)    From Fansidar combination drug Triggers asthma    Zestril  [Lisinopril ] Cough   Nifedical Xl [Nifedipine ] Nausea And Vomiting   Reglan  [Metoclopramide ] Swelling and Other (See Comments)    Restlessness Able to take with Benadryl    Trandate  [Labetalol ] Nausea And Vomiting and Other (See Comments)    Triggers asthma     Family History  Problem Relation Age of Onset   Hypertension Mother    Coronary artery disease Mother    Hypertension Father    Stroke Father    Atrial fibrillation Father    Heart failure Maternal Grandfather    Heart attack Maternal Grandfather      Prior to Admission medications   Medication Sig Start Date End Date Taking? Authorizing Provider  acetaminophen  (TYLENOL ) 500 MG tablet Take 1,000 mg by mouth 3 (three) times daily as needed for moderate pain (pain score 4-6), headache, fever or mild pain (pain score 1-3).    [provider]  albuterol  (VENTOLIN  HFA) 108 (90 Base)  MCG/ACT inhaler Inhale 2 puffs into the lungs every 4 (four) hours as needed for wheezing or shortness of breath. Patient taking differently: Inhale 2 puffs into the lungs as needed for wheezing or shortness of breath. 10/15/22   Trudy Fusi, DO  bupivacaine ,PF, (MARCAINE ) 0.5 % SOLN injection Instill 15 mLs into bladder daily with heparin . Patient not taking: Reported on 04/22/2023 04/06/23     dicyclomine  (BENTYL ) 20 MG tablet Take 1 tablet (20 mg total) by mouth 2 (two) times daily. Patient not taking: Reported on 04/22/2023 03/29/23   Trish Furl, MD  EPINEPHrine  0.3 mg/0.3 mL IJ SOAJ injection Inject 0.3 mg into the muscle as needed for anaphylaxis. 01/15/21   Trudy Fusi, DO  FLUoxetine  (PROZAC ) 20 MG capsule Take 3 capsules (60 mg total) by mouth daily. 05/15/23 07/14/23  Buena Carmine, NP  heparin  10000 UNIT/ML injection Instill 4  mLs (40,000 Units total) into the bladder daily with bupivacaine . Patient not taking: Reported on 04/22/2023 04/06/23     hyoscyamine  (LEVSIN/SL) 0.125 MG SL tablet Place 1 tablet (0.125 mg total) under the tongue every 4 (four) hours as needed (spasm). Up to 1.25 mg daily 05/29/23   Spence Dux, PA-C  irbesartan -hydrochlorothiazide  (AVALIDE) 150-12.5 MG tablet Take 2 tablets by mouth in the morning and at bedtime. 10/06/22 10/06/23  Cathey Clunes, MD  levothyroxine  (SYNTHROID ) 150 MCG tablet Take 1 tablet (150 mcg total) by mouth daily at 6 (six) AM. 10/06/22 10/06/23  Cathey Clunes, MD  linaclotide  (LINZESS ) 145 MCG CAPS capsule Take 1 capsule (145 mcg total) by mouth daily before breakfast. Patient not taking: Reported on 04/22/2023 04/08/23 05/08/23  Dalene Duck, MD  NEEDLE, DISP, 18 G (BD DISP NEEDLES) 18G X 1-1/2" MISC Use as directed with daily bladder instillations 04/06/23     OLANZapine  (ZYPREXA ) 2.5 MG tablet Take 1 tablet (2.5 mg total) by mouth daily. 05/15/23 07/14/23  Buena Carmine, NP  OLANZapine  (ZYPREXA ) 2.5 MG tablet Take 1 tablet (2.5 mg total) by mouth daily. 05/15/23   Buena Carmine, NP  OLANZapine  (ZYPREXA ) 7.5 MG tablet Take 1 tablet (7.5 mg total) by mouth at bedtime. 05/15/23 07/14/23  Buena Carmine, NP  OLANZapine  (ZYPREXA ) 7.5 MG tablet Take 1 tablet (7.5 mg total) by mouth at bedtime. 05/15/23   Buena Carmine, NP  oxyCODONE -acetaminophen  (PERCOCET/ROXICET) 5-325 MG tablet Take 1 tablet by mouth every 6 (six) hours as needed for severe pain (pain score 7-10). 06/29/23   Long, Shereen Dike, MD  pantoprazole  (PROTONIX ) 40 MG tablet Take 1 tablet (40 mg total) by mouth 2 (two) times daily. Patient taking differently: Take 40 mg by mouth at bedtime. 03/21/23 06/30/23  Lesa Rape, MD  promethazine  (PHENERGAN ) 25 MG suppository Place 1 suppository (25 mg total) rectally every 6 (six) hours as needed for nausea or vomiting. 04/27/23 06/30/23  Ezenduka, Nkeiruka J,  MD  Syringe, Disposable, 20 ML MISC Use as directed for daily bladder instillations 04/06/23     traMADol  (ULTRAM ) 50 MG tablet Take 1 tablet (50 mg total) by mouth every 12 (twelve) hours as needed for moderate pain (pain score 4-6). 04/27/23   Veronica Gordon, MD    Physical Exam: BP (!) 173/110 (BP Location: Right Arm)   Pulse 70   Temp 98.5 F (36.9 C)   Resp 16   Ht 5\' 11"  (1.803 m)   Wt 106.3 kg   LMP 06/22/2023 Comment: pregnancy waiver formed signed 06-30-2023  SpO2 97%   BMI  32.69 kg/m  General: Pleasant, well-appearing young woman laying in bed. No acute distress. HEENT: [place]/AT. Anicteric sclera CV: RRR. No murmurs, rubs, or gallops. No LE edema Pulmonary: Lungs CTAB. Normal effort. No wheezing or rales. Abdominal: Soft, nondistended. Moderate ttp of the lower abdomen. Normal bowel sounds. MSK: Amputated left hand. Right wrist reconstructed w/ healed surgical scar. NVI  Skin: Warm and dry. No obvious rash or lesions. Neuro: A&Ox3. Moves all extremities. Normal sensation to light touch. No focal deficit. Psych: Normal mood and affect          Labs on Admission:  Basic Metabolic Panel: Recent Labs  Lab 06/29/23 1500 06/30/23 1203  NA 137 137  K 3.7 3.7  CL 102 99  CO2 26 26  GLUCOSE 81 89  BUN 16 16  CREATININE 0.88 0.83  CALCIUM  9.0 9.4   Liver Function Tests: Recent Labs  Lab 06/30/23 1203  AST 27  ALT 28  ALKPHOS 62  BILITOT 0.6  PROT 7.6  ALBUMIN 4.2   Recent Labs  Lab 06/30/23 1203  LIPASE 27   No results for input(s): "AMMONIA" in the last 168 hours. CBC: Recent Labs  Lab 06/29/23 1500 06/30/23 1203  WBC 8.1 9.5  NEUTROABS 4.7  --   HGB 13.3 14.3  HCT 40.7 43.3  MCV 95.5 96.0  PLT 363 390   Cardiac Enzymes: No results for input(s): "CKTOTAL", "CKMB", "CKMBINDEX", "TROPONINI" in the last 168 hours. BNP (last 3 results) No results for input(s): "BNP" in the last 8760 hours.  ProBNP (last 3 results) No results for input(s):  "PROBNP" in the last 8760 hours.  CBG: No results for input(s): "GLUCAP" in the last 168 hours.  Radiological Exams on Admission: CT Head Wo Contrast Result Date: 06/30/2023 CLINICAL DATA:  headache, vision changes, htn. EXAM: CT HEAD WITHOUT CONTRAST TECHNIQUE: Contiguous axial images were obtained from the base of the skull through the vertex without intravenous contrast. RADIATION DOSE REDUCTION: This exam was performed according to the departmental dose-optimization program which includes automated exposure control, adjustment of the mA and/or kV according to patient size and/or use of iterative reconstruction technique. COMPARISON:  MRI head from 05/04/2022. FINDINGS: Brain: No evidence of acute infarction, hemorrhage, hydrocephalus, extra-axial collection or mass lesion/mass effect. Ventricles are normal. Cerebral volume is age appropriate. Vascular: No hyperdense vessel or unexpected calcification. Skull: Normal. Negative for fracture or focal lesion. Sinuses/Orbits: No acute finding. Other: Visualized mastoid air cells are unremarkable. No mastoid effusion. IMPRESSION: *No acute intracranial abnormality. Electronically Signed   By: Beula Brunswick M.D.   On: 06/30/2023 13:36   CT ABDOMEN PELVIS W CONTRAST Result Date: 06/30/2023 CLINICAL DATA:  abdominal pain, n/v. EXAM: CT ABDOMEN AND PELVIS WITH CONTRAST TECHNIQUE: Multidetector CT imaging of the abdomen and pelvis was performed using the standard protocol following bolus administration of intravenous contrast. RADIATION DOSE REDUCTION: This exam was performed according to the departmental dose-optimization program which includes automated exposure control, adjustment of the mA and/or kV according to patient size and/or use of iterative reconstruction technique. CONTRAST:  OMNIPAQUE  IOHEXOL  300 MG/ML  SOLN COMPARISON:  CT scan abdomen and pelvis from 03/13/2023. FINDINGS: Lower chest: There are dependent changes in the visualized lung bases.  No overt consolidation. No pleural effusion. The heart is normal in size. No pericardial effusion. Hepatobiliary: The liver is normal in size. Non-cirrhotic configuration. No suspicious mass. No intrahepatic or extrahepatic bile duct dilation. Gallbladder is surgically absent. Pancreas: Unremarkable. No pancreatic ductal dilatation or surrounding inflammatory  changes. Spleen: Lobulated spleen however, size within normal limits. No focal mass. Adrenals/Urinary Tract: Adrenal glands are unremarkable. No suspicious renal mass. There is a subcentimeter sized hypoattenuating focus in the left kidney lower pole, which is too small to adequately characterize. No hydronephrosis. No renal or ureteric calculi. Urinary bladder is under distended, precluding optimal assessment. However, no large mass or stones identified. No perivesical fat stranding. Stomach/Bowel: There is a small sliding hiatal hernia. No disproportionate dilation of the small or large bowel loops. No evidence of abnormal bowel wall thickening or inflammatory changes. The appendix was not visualized; however there is no acute inflammatory process in the right lower quadrant. Vascular/Lymphatic: No ascites or pneumoperitoneum. No abdominal or pelvic lymphadenopathy, by size criteria. No aneurysmal dilation of the major abdominal arteries. Reproductive: The uterus is unremarkable. The ovaries are unremarkable. Other: There is a tiny fat containing umbilical hernia. The soft tissues and abdominal wall are otherwise unremarkable. Musculoskeletal: No suspicious osseous lesions. There are mild multilevel degenerative changes in the visualized spine. L5 limbus vertebra noted. IMPRESSION: 1. No acute inflammatory process identified within the abdomen or pelvis. 2. Multiple other nonacute observations, as described above. Electronically Signed   By: Beula Brunswick M.D.   On: 06/30/2023 13:35   Assessment/Plan Lesha C Heckard is a 33 y.o. 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  # Intractable nausea and vomiting # Abdominal pain # Chronic interstitial nephritis - Has had multiple hospitalizations for same problem over the last year - In the setting of her worsening interstitial nephritis - Unable to tolerate p.o. intake in the outpatient - Will admit for pain control and IV antiemetics - Continue home tramadol  50 mg PRN for moderate pain - IV Dilaudid  1 mg Q4H for severe breakthrough pain - IV Zofran  as needed for nausea and vomiting - Clear liquid diet, advance as tolerated - Follow-up with urology in the outpatient  # Resistant Hypertension # Hypertensive urgency - Hx of  uncontrolled hypertension, preeclampsia and HELLP syndrome.  - Reports she vomited out her BP meds prior to arrival - Found to have SBP in the 210s on arrival - CT head negative for acute intracranial abnormality, no focal neurodeficits on exam - SBP improved to 150s to 170s after receiving home BP meds in the ED - Continue hydrochlorothiazide  and irbesartan  - IV hydralazine  10 mg as needed for SBP >180 or DBP >120   # Hashimoto thyroiditis - Patient reports history of hypothyroidism from Hashimoto's thyroiditis. - Continue Synthroid   # Adrenal insufficiency - Continue hydrocortisone   # Anxiety/depression # Borderline personality disorder -Continue Zyprexa , hydroxyzine  and Prozac    # Asthma - Respiratory status stable  - As needed albuterol  nebs  # IBS - Continue Linzess   # GERD - Continue Protonix   DVT prophylaxis: Lovenox      Code Status: Full Code  Consults called:  None  Family Communication: No family at bedside  Severity of Illness: The appropriate patient status for this patient is OBSERVATION. Observation status is judged to be reasonable and necessary in order to provide the required intensity of service to ensure the patient's safety. The patient's presenting symptoms, physical exam findings, and initial radiographic and laboratory data in the context of their  medical condition is felt to place them at decreased risk for further clinical deterioration. Furthermore, it is anticipated that the patient will be medically stable for discharge from the hospital within 2 midnights of admission.   Level of care: Med-Surg   This record has been created using Conservation officer, historic buildings. Errors have been sought and corrected, but may not always be located. Such creation errors do not reflect on the standard of care.   Vita Grip, MD 06/30/2023, 6:15 PM [facility] Pager: [phone] Isaiah 41:10   If 7PM-7AM, please contact night-coverage [email or web address] Password [email or web address]

## 2023-07-01 DIAGNOSIS — J45909 Unspecified asthma, uncomplicated: Secondary | ICD-10-CM | POA: Diagnosis present

## 2023-07-01 DIAGNOSIS — F32A Depression, unspecified: Secondary | ICD-10-CM | POA: Diagnosis present

## 2023-07-01 DIAGNOSIS — N119 Chronic tubulo-interstitial nephritis, unspecified: Secondary | ICD-10-CM | POA: Diagnosis present

## 2023-07-01 DIAGNOSIS — R112 Nausea with vomiting, unspecified: Secondary | ICD-10-CM | POA: Diagnosis present

## 2023-07-01 DIAGNOSIS — Z9102 Food additives allergy status: Secondary | ICD-10-CM | POA: Diagnosis not present

## 2023-07-01 DIAGNOSIS — E876 Hypokalemia: Secondary | ICD-10-CM | POA: Diagnosis present

## 2023-07-01 DIAGNOSIS — I160 Hypertensive urgency: Secondary | ICD-10-CM | POA: Diagnosis present

## 2023-07-01 DIAGNOSIS — E2740 Unspecified adrenocortical insufficiency: Secondary | ICD-10-CM | POA: Diagnosis present

## 2023-07-01 DIAGNOSIS — F419 Anxiety disorder, unspecified: Secondary | ICD-10-CM | POA: Diagnosis present

## 2023-07-01 DIAGNOSIS — G8929 Other chronic pain: Secondary | ICD-10-CM | POA: Diagnosis present

## 2023-07-01 DIAGNOSIS — Z91030 Bee allergy status: Secondary | ICD-10-CM | POA: Diagnosis not present

## 2023-07-01 DIAGNOSIS — Z8249 Family history of ischemic heart disease and other diseases of the circulatory system: Secondary | ICD-10-CM | POA: Diagnosis not present

## 2023-07-01 DIAGNOSIS — F603 Borderline personality disorder: Secondary | ICD-10-CM | POA: Diagnosis present

## 2023-07-01 DIAGNOSIS — Z79890 Hormone replacement therapy (postmenopausal): Secondary | ICD-10-CM | POA: Diagnosis not present

## 2023-07-01 DIAGNOSIS — I1A0 Resistant hypertension: Secondary | ICD-10-CM | POA: Diagnosis present

## 2023-07-01 DIAGNOSIS — Z6832 Body mass index (BMI) 32.0-32.9, adult: Secondary | ICD-10-CM | POA: Diagnosis not present

## 2023-07-01 DIAGNOSIS — E66811 Obesity, class 1: Secondary | ICD-10-CM | POA: Diagnosis present

## 2023-07-01 DIAGNOSIS — Z888 Allergy status to other drugs, medicaments and biological substances status: Secondary | ICD-10-CM | POA: Diagnosis not present

## 2023-07-01 DIAGNOSIS — R102 Pelvic and perineal pain: Secondary | ICD-10-CM | POA: Diagnosis present

## 2023-07-01 DIAGNOSIS — K219 Gastro-esophageal reflux disease without esophagitis: Secondary | ICD-10-CM | POA: Diagnosis present

## 2023-07-01 DIAGNOSIS — I159 Secondary hypertension, unspecified: Secondary | ICD-10-CM | POA: Diagnosis present

## 2023-07-01 DIAGNOSIS — E871 Hypo-osmolality and hyponatremia: Secondary | ICD-10-CM | POA: Diagnosis present

## 2023-07-01 DIAGNOSIS — E063 Autoimmune thyroiditis: Secondary | ICD-10-CM | POA: Diagnosis present

## 2023-07-01 DIAGNOSIS — R52 Pain, unspecified: Secondary | ICD-10-CM | POA: Diagnosis present

## 2023-07-01 DIAGNOSIS — Z885 Allergy status to narcotic agent status: Secondary | ICD-10-CM | POA: Diagnosis not present

## 2023-07-01 LAB — BASIC METABOLIC PANEL WITH GFR
Anion gap: 9 (ref 5–15)
BUN: 13 mg/dL (ref 6–20)
CO2: 26 mmol/L (ref 22–32)
Calcium: 8.9 mg/dL (ref 8.9–10.3)
Chloride: 97 mmol/L — ABNORMAL LOW (ref 98–111)
Creatinine, Ser: 0.78 mg/dL (ref 0.44–1.00)
GFR, Estimated: >60 mL/min (ref >60)
Glucose, Bld: 98 mg/dL (ref 70–99)
Potassium: 3.4 mmol/L — ABNORMAL LOW (ref 3.5–5.1)
Sodium: 132 mmol/L — ABNORMAL LOW (ref 135–145)

## 2023-07-01 LAB — CBC
HCT: 42.2 % (ref 36.0–46.0)
Hemoglobin: 13.9 g/dL (ref 12.0–15.0)
MCH: 31.4 pg (ref 26.0–34.0)
MCHC: 32.9 g/dL (ref 30.0–36.0)
MCV: 95.3 fL (ref 80.0–100.0)
Platelets: 366 10*3/uL (ref 150–400)
RBC: 4.43 MIL/uL (ref 3.87–5.11)
RDW: 13.4 % (ref 11.5–15.5)
WBC: 7.3 10*3/uL (ref 4.0–10.5)
nRBC: 0 % (ref 0.0–0.2)

## 2023-07-01 MED ORDER — AMLODIPINE BESYLATE 10 MG PO TABS
10.0000 mg | ORAL_TABLET | Freq: Every day | ORAL | Status: DC
  Administered 2023-07-01 – 2023-07-04 (×4): 10 mg via ORAL
  Filled 2023-07-01 (×4): qty 1

## 2023-07-01 MED ORDER — LORAZEPAM 2 MG/ML IJ SOLN
1.0000 mg | Freq: Two times a day (BID) | INTRAMUSCULAR | Status: DC | PRN
  Administered 2023-07-01 – 2023-07-04 (×4): 1 mg via INTRAVENOUS
  Filled 2023-07-01 (×4): qty 1

## 2023-07-01 MED ORDER — HYDROMORPHONE HCL 1 MG/ML IJ SOLN
1.0000 mg | INTRAMUSCULAR | Status: DC | PRN
  Administered 2023-07-01 – 2023-07-02 (×9): 1 mg via INTRAVENOUS
  Filled 2023-07-01 (×9): qty 1

## 2023-07-01 MED ORDER — OLANZAPINE 5 MG PO TBDP
2.5000 mg | ORAL_TABLET | Freq: Every day | ORAL | Status: DC
  Administered 2023-07-02 – 2023-07-04 (×3): 2.5 mg via ORAL
  Filled 2023-07-01 (×3): qty 1

## 2023-07-01 MED ORDER — LACTATED RINGERS IV SOLN: INTRAVENOUS | Status: AC

## 2023-07-01 MED ORDER — LORAZEPAM 2 MG/ML IJ SOLN
1.0000 mg | Freq: Once | INTRAMUSCULAR | Status: AC
  Administered 2023-07-01: 1 mg via INTRAVENOUS
  Filled 2023-07-01: qty 1

## 2023-07-01 MED ORDER — PANTOPRAZOLE SODIUM 40 MG IV SOLR
40.0000 mg | Freq: Two times a day (BID) | INTRAVENOUS | Status: DC
  Administered 2023-07-01 – 2023-07-04 (×6): 40 mg via INTRAVENOUS
  Filled 2023-07-01 (×6): qty 10

## 2023-07-01 MED ORDER — OLANZAPINE 5 MG PO TBDP
7.5000 mg | ORAL_TABLET | Freq: Every day | ORAL | Status: DC
  Administered 2023-07-01 – 2023-07-03 (×3): 7.5 mg via ORAL
  Filled 2023-07-01 (×3): qty 2

## 2023-07-01 MED ORDER — HYDROCORTISONE SOD SUC (PF) 100 MG IJ SOLR
50.0000 mg | Freq: Three times a day (TID) | INTRAMUSCULAR | Status: DC
  Filled 2023-07-01: qty 1

## 2023-07-01 MED ORDER — HYDROCORTISONE SOD SUC (PF) 100 MG IJ SOLR
50.0000 mg | Freq: Three times a day (TID) | INTRAMUSCULAR | Status: DC
  Administered 2023-07-01 – 2023-07-04 (×9): 50 mg via INTRAVENOUS
  Filled 2023-07-01 (×14): qty 1

## 2023-07-01 MED ORDER — HYDRALAZINE HCL 20 MG/ML IJ SOLN
10.0000 mg | Freq: Four times a day (QID) | INTRAMUSCULAR | Status: DC | PRN
  Administered 2023-07-01 – 2023-07-02 (×2): 10 mg via INTRAVENOUS
  Filled 2023-07-01 (×3): qty 1

## 2023-07-01 NOTE — Plan of Care (Signed)
 Patient states she has anxiety. Problem: Education: Goal: Knowledge of General Education information will improve Description: Including pain rating scale, medication(s)/side effects and non-pharmacologic comfort measures Outcome: Progressing   Problem: Health Behavior/Discharge Planning: Goal: Ability to manage health-related needs will improve Outcome: Progressing   Problem: Clinical Measurements: Goal: Ability to maintain clinical measurements within normal limits will improve Outcome: Progressing Goal: Will remain free from infection Outcome: Progressing Goal: Diagnostic test results will improve Outcome: Progressing Goal: Respiratory complications will improve Outcome: Progressing Goal: Cardiovascular complication will be avoided Outcome: Progressing   Problem: Activity: Goal: Risk for activity intolerance will decrease Outcome: Progressing   Problem: Nutrition: Goal: Adequate nutrition will be maintained Outcome: Progressing   Problem: Coping: Goal: Level of anxiety will decrease Outcome: Progressing   Problem: Elimination: Goal: Will not experience complications related to bowel motility Outcome: Progressing Goal: Will not experience complications related to urinary retention Outcome: Progressing   Problem: Pain Managment: Goal: General experience of comfort will improve and/or be controlled Outcome: Progressing   Problem: Safety: Goal: Ability to remain free from injury will improve Outcome: Progressing   Problem: Skin Integrity: Goal: Risk for impaired skin integrity will decrease Outcome: Progressing

## 2023-07-01 NOTE — TOC Initial Note (Addendum)
 Transition of Care [facility]) - Initial/Assessment Note    Patient Details  Name: Krystal [name] MRN: [id number] Date of Birth: November 16, 1990  Transition [facility] CM/SW Contact:    Kathryn Parish, RN Phone Number: 07/01/2023, 1:13 PM  Clinical Narrative:                 PTA patient independent; drives and lives with spouse and daughter in sister's house; No PCP, pt agreeable to [facility] scheduling appointment; Insurance verified; No DME, HH, oxygen or SDOH needs;Pt's spouse Polly Brink will transport home at discharge.  1:32 PM PCP appointment scheduled and on AVS. TOC signing off  Expected Discharge Plan: Home/Self Care Barriers to Discharge: Continued Medical Work up   Patient Goals and CMS Choice Patient states their goals for this hospitalization and ongoing recovery are:: home   Choice offered to / list presented to : NA      Expected Discharge Plan and Services In-house Referral: NA Discharge Planning Services: CM Consult Post Acute Care Choice: NA Living arrangements for the past 2 months: Single Family Home                 DME Arranged: N/A DME Agency: NA       HH Arranged: NA HH Agency: NA        Prior Living Arrangements/Services Living arrangements for the past 2 months: Single Family Home Lives with:: Spouse, Relatives Patient language and need for interpreter reviewed:: Yes Do you feel safe going back to the place where you live?: Yes      Need for Family Participation in Patient Care: Yes (Comment) Care giver support system in place?: Yes (comment)   Criminal Activity/Legal Involvement Pertinent to Current Situation/Hospitalization: No - Comment as needed  Activities of Daily Living   ADL Screening (condition at time of admission) Independently performs ADLs?: Yes (appropriate for developmental age) Is the patient deaf or have difficulty hearing?: No Does the patient have difficulty seeing, even when wearing glasses/contacts?: No Does the patient have  difficulty concentrating, remembering, or making decisions?: No  Permission Sought/Granted Permission sought to share information with : Case Manager Permission granted to share information with : Yes, Verbal Permission Granted  Share Information with NAME: Newman,Brian     Permission granted to share info w Relationship: Spouse  Permission granted to share info w Contact Information: [phone] (Mobile)  Emotional Assessment Appearance:: Appears stated age Attitude/Demeanor/Rapport: Engaged Affect (typically observed): Appropriate Orientation: : Oriented to Self, Oriented to Place, Oriented to  Time, Oriented to Situation Alcohol / Substance Use: Not Applicable Psych Involvement: No (comment)  Admission diagnosis:  Intractable nausea and vomiting [R11.2] Patient Active Problem List   Diagnosis Date Noted   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/30/2023   Intractable nausea and vomiting 03/13/2023   Intractable vomiting with nausea 03/13/2023   Encounter for screening involving social determinants of health (SDoH) 10/07/2022   Chronic interstitial cystitis 05/05/2022   Chronic diarrhea 04/29/2022   Lower abdominal pain 04/28/2022   Adrenal insufficiency (HCC) 04/26/2022   Bradycardia 04/24/2022   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 03/05/2022   Moderate persistent asthma with acute exacerbation 12/04/2021   Cervicalgia 10/31/2021   Borderline personality disorder in adult [facility]) 10/21/2021   Anisocoria 07/02/2021   History of excision of intestinal structure 05/23/2021   Attention deficit disorder 04/17/2021   Chronic fatigue syndrome 04/17/2021   Anosmia 04/17/2021   Chronic urticaria 01/15/2021   Local reaction to hymenoptera sting 01/15/2021   Perennial allergic rhinitis  01/15/2021   MDD (major depressive disorder), recurrent episode, severe (HCC) 11/19/2020   Fibromyalgia 05/11/2020   Gastroesophageal reflux disease 05/11/2020   Interstitial cystitis  05/11/2020   PTSD (post-traumatic stress disorder) 05/11/2020   Primary insomnia 05/11/2020   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   Hypertensive urgency 05/08/2020   Hypothyroidism due to Hashimoto's thyroiditis 10/03/2019   Anxiety 06/16/2019   Asthma 06/07/2019   Obesity 06/07/2019   Lumbar spondylosis 06/23/2018   Sacroiliac inflammation (HCC) 06/23/2018   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   History of upper limb amputation, wrist, 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   PCP:  Patient, No Pcp Per Pharmacy:   Melodee Sp[facility] - [facility] [address] [place] [address] Phone: [phone] Fax: [phone]     Social Drivers of Health (SDOH) Social History: SDOH Screenings   Food Insecurity: No Food Insecurity (06/30/2023)  Recent Concern: Food Insecurity - Food Insecurity Present (04/22/2023)  Housing: Low Risk  (06/30/2023)  Recent Concern: Housing - High Risk (04/22/2023)  Transportation Needs: No Transportation Needs (06/30/2023)  Recent Concern: Transportation Needs - Unmet Transportation Needs (04/22/2023)  Utilities: Not At Risk (06/30/2023)  Recent Concern: Utilities - At Risk (04/22/2023)  Alcohol Screen: Low Risk  (04/09/2022)  Depression (PHQ2-9): High Risk (10/06/2022)  Physical Activity: Inactive (04/09/2022)  Social Connections: Unknown (06/30/2023)  Tobacco Use: Low Risk  (06/30/2023)   SDOH Interventions:     Readmission Risk Interventions    03/17/2023    1:59 PM 04/30/2022    3:03 PM  Readmission Risk Prevention Plan  Transportation Screening Complete Complete  PCP or Specialist Appt within 3-5 Days Complete   HRI or Home Care Consult Complete   Social Work Consult for Recovery Care Planning/Counseling Complete   Palliative Care Screening Complete   Medication Review [facility]) Complete Referral to Pharmacy  PCP or Specialist appointment within 3-5 days of discharge  --  HRI or Home Care Consult  Complete  SW Recovery Care/Counseling Consult  Complete   Palliative Care Screening  Not Applicable  Skilled Nursing Facility  Not Applicable

## 2023-07-01 NOTE — Progress Notes (Addendum)
 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 Patient was evaluated urology office on 5/5 for follow-up for chronic interstitial nephritis and was recommended to go to the ED for her acute on chronic symptoms.  3/28:1 1. Cystourethroscopy. 2. Urethral calibration. 3. Hydrodistention of the bladder under general anesthesia. 4. Pelvic floor trigger point injection of bupivacaine  and triamcinolone  for myalgia. 5. Bladder installation with phenazopyridine  and bupivacaine . 6. Bladder biopsy with fulguration of base.  In the ED, started on IV fluids, antiemetics and pain medication  Subjective:  Continues to have pelvic pain.  Vomited this morning and is feeling anxious. States that she was evaluated at urology office on 5/5 and was told that her vagina was quite tight.  She was recommended to follow-up with GYN.  Assessment and Plan: Principal Problem:   Intractable nausea and vomiting with acute on chronic pelvic pain- - She feels that the cystoscopy done on 3/8 with urinary urgency and burning but did not appear to help with the pain - Her symptoms are multifactorial secondary to interstitial nephritis, possible pelvic floor tightening, stress and anxiety - Twice daily PPI ordered - Treat anxiety aggressively, change oral steroids to IV for now and also allow her to have bowel rest plan-have increased IV Dilaudid  for now with the understanding that we will wean quickly once her pain is better controlled  Active Problems:   Hypertensive urgency - Secondary to acute  pain, anxiety in addition to chronic hypertension - Continue Avapro  - Add amlodipine  - As needed hydralazine  has been ordered -Hold HCTZ until vomiting improves and she is better hydrated  Adrenal insufficiency - Have changed Solu-Cortef  to hydrocortisone  IV and will continue this until vomiting resolves  Hyponatremia and hypokalemia Sodium 132 and potassium 3.4 - Replace and follow - As mentioned above, will hold HCTZ    Code Status: Full Code Total time on patient care: 35 minutes DVT prophylaxis:  enoxaparin  (LOVENOX ) injection 40 mg Start: 06/30/23 2200     Objective:   Vitals:   06/30/23 1740 06/30/23 2310 07/01/23 0004 07/01/23 0425  BP:  (!) 167/126 (!) 158/97 (!) 181/117  Pulse:  65 62 63  Resp:  16  18  Temp:  97.9 F (36.6 C)  97.7 F (36.5 C)  TempSrc:  Oral    SpO2:  99% 97% 93%  Weight: 106.3 kg     Height: 5\' 11"  (1.803 m)      Filed Weights   06/30/23 1740  Weight: 106.3 kg   Exam: General exam: Appears uncomfortable HEENT: oral mucosa moist Respiratory system: Clear to auscultation.  Cardiovascular system: S1 & S2 heard  Gastrointestinal system: Abdomen soft, has tenderness in the lower abdomen and suprapubic area nondistended, bowel sounds positive Extremities: No cyanosis, clubbing or edema Psychiatry:  Mood & affect appropriate.      CBC: Recent Labs  Lab 06/29/23 1500 06/30/23 1203 07/01/23 0402  WBC 8.1 9.5 7.3  NEUTROABS 4.7  --   --   HGB 13.3 14.3 13.9  HCT 40.7 43.3 42.2  MCV 95.5 96.0 95.3  PLT 363 390 366  Basic Metabolic Panel: Recent Labs  Lab 06/29/23 1500 06/30/23 1203 07/01/23 0624  NA 137 137 132*  K 3.7 3.7 3.4*  CL 102 99 97*  CO2 26 26 26   GLUCOSE 81 89 98  BUN 16 16 13   CREATININE 0.88 0.83 0.78  CALCIUM  9.0 9.4 8.9     Scheduled Meds:  enoxaparin  (LOVENOX ) injection  40 mg Subcutaneous QHS   FLUoxetine   60 mg Oral Daily   irbesartan   300 mg Oral BID   And   hydrochlorothiazide   25 mg Oral BID    hydrocortisone   10 mg Oral QHS   hydrocortisone   30 mg Oral Daily   hydrOXYzine   50 mg Oral QHS   levothyroxine   150 mcg Oral Q0600   linaclotide   145 mcg Oral QAC breakfast   OLANZapine   2.5 mg Oral Daily   OLANZapine   7.5 mg Oral QHS   sodium chloride  flush  10-40 mL Intracatheter Q12H    Imaging and lab data personally reviewed   Author: Dora Clauss  07/01/2023 9:01 AM  To contact [facility]>   Check the care team in [facility] and look for the attending/consulting TRH provider listed  Log into [email or web address] and use [place]'s universal password   Go to> "[facility]"  and find provider  If you still have difficulty reaching the provider, please page the [facility] (Director on Call) for the Hospitalists listed on amion

## 2023-07-02 DIAGNOSIS — R112 Nausea with vomiting, unspecified: Secondary | ICD-10-CM | POA: Diagnosis not present

## 2023-07-02 LAB — BASIC METABOLIC PANEL WITH GFR
Anion gap: 7 (ref 5–15)
BUN: 10 mg/dL (ref 6–20)
CO2: 27 mmol/L (ref 22–32)
Calcium: 9.1 mg/dL (ref 8.9–10.3)
Chloride: 103 mmol/L (ref 98–111)
Creatinine, Ser: 0.71 mg/dL (ref 0.44–1.00)
GFR, Estimated: >60 mL/min (ref >60)
Glucose, Bld: 117 mg/dL — ABNORMAL HIGH (ref 70–99)
Potassium: 4 mmol/L (ref 3.5–5.1)
Sodium: 137 mmol/L (ref 135–145)

## 2023-07-02 MED ORDER — HYDROMORPHONE HCL 1 MG/ML IJ SOLN
1.0000 mg | INTRAMUSCULAR | Status: DC | PRN
Start: 2023-07-02 — End: 2023-07-04
  Administered 2023-07-02 – 2023-07-04 (×14): 1 mg via INTRAVENOUS
  Filled 2023-07-02 (×14): qty 1

## 2023-07-02 MED ORDER — OXYCODONE HCL 5 MG PO TABS
10.0000 mg | ORAL_TABLET | ORAL | Status: DC | PRN
  Administered 2023-07-02 – 2023-07-04 (×11): 10 mg via ORAL
  Filled 2023-07-02 (×11): qty 2

## 2023-07-02 NOTE — Progress Notes (Addendum)
 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 Patient was evaluated urology office on 5/5 for follow-up for chronic interstitial nephritis and was recommended to go to the ED for her acute on chronic symptoms.  3/28:1 1. Cystourethroscopy. 2. Urethral calibration. 3. Hydrodistention of the bladder under general anesthesia. 4. Pelvic floor trigger point injection of bupivacaine  and triamcinolone  for myalgia. 5. Bladder installation with phenazopyridine  and bupivacaine . 6. Bladder biopsy with fulguration of base.  In the ED, started on IV fluids, antiemetics and pain medication  Subjective:  Continues to have pelvic pain and IV Dilaudid  is not lasting long enough. No longer has nausea or vomiting so would like to try oral meds.   Assessment and Plan: Principal Problem:   Intractable nausea and vomiting with acute on chronic pelvic pain- - She feels that the cystoscopy done on 3/8 with urinary urgency and burning but did not appear to help with the pain - Her symptoms are multifactorial secondary to interstitial nephritis, possible pelvic floor tightening, stress and anxiety - Twice daily PPI ordered - 5/7> Treat anxiety aggressively, change oral steroids to IV for now and also allow her to have bowel rest plan-have increased IV Dilaudid  for now with the understanding that we will wean quickly once her pain is better controlled - 5/8> start Oxycodone , leave Dilaudid  for breakthrough pain, referral sent to Gyn, Dr Jonel Nephew, discussed plan extensively with  husband  Active Problems:   Hypertensive urgency - Secondary to acute pain, anxiety in addition to chronic hypertension - Continue Avapro  - Added amlodipine  - As needed hydralazine  has been ordered - Hold HCTZ until vomiting improves and she is better hydrated  Adrenal insufficiency - Have changed Solu-Cortef  to hydrocortisone  IV and will continue this until vomiting resolves - Have referred her to Dr Kathyanne Parkers  Hyponatremia and hypokalemia - Sodium 132 and potassium 3.4 - Replaced   - As mentioned above, will cont to hold hydrochlorothiazide   Anxiety and depression - Prozac  and Zyprexa  are helping but not completely- cont IV Ativan  BID - previously on Cymbalta and Zoloft  - I have recommended she look into Esketamine treatment  H/o hemorrhagic left ovarian cyst - s/p lysis of adhesions and left ovarian cystectomy    Code Status: Full Code Total time on patient care: 35 minutes DVT prophylaxis:  enoxaparin  (LOVENOX ) injection 40 mg Start: 06/30/23 2200   Objective:   Vitals:   07/01/23 1939 07/01/23 2200 07/02/23 0503 07/02/23 1203  BP: (!) 171/114 (!) 165/99 (!) 171/113 (!) 173/116  Pulse: 81 75 67 (!) 118  Resp: 16 16 20 20   Temp: 98.3 F (36.8 C)  97.9 F (36.6 C) 98.3 F (36.8 C)  TempSrc:   Oral   SpO2: 98% 99% 99% 98%  Weight:      Height:       Filed Weights   06/30/23 1740  Weight: 106.3 kg   Exam: General exam: Appears uncomfortable HEENT: oral mucosa moist Respiratory system: Clear to auscultation.  Cardiovascular system: S1 & S2 heard  Gastrointestinal system: Abdomen soft, has tenderness in the lower abdomen and suprapubic area nondistended, bowel sounds  positive Extremities: No cyanosis, clubbing or edema Psychiatry:  Mood & affect appropriate.   CBC: Recent Labs  Lab 06/29/23 1500 06/30/23 1203 07/01/23 0402  WBC 8.1 9.5 7.3  NEUTROABS 4.7  --   --   HGB 13.3 14.3 13.9  HCT 40.7 43.3 42.2  MCV 95.5 96.0 95.3  PLT 363 390 366   Basic  Metabolic Panel: Recent Labs  Lab 06/29/23 1500 06/30/23 1203 07/01/23 0624 07/02/23 0340  NA 137 137 132* 137  K 3.7 3.7 3.4* 4.0  CL 102 99 97* 103  CO2 26 26 26 27   GLUCOSE 81 89 98 117*  BUN 16 16 13 10   CREATININE 0.88 0.83 0.78 0.71  CALCIUM  9.0 9.4 8.9 9.1     Scheduled Meds:  amLODipine   10 mg Oral Daily   enoxaparin  (LOVENOX ) injection  40 mg Subcutaneous QHS   FLUoxetine   60 mg Oral Daily   hydrocortisone  sod succinate (SOLU-CORTEF ) inj  50 mg Intravenous Q8H   hydrOXYzine   50 mg Oral QHS   irbesartan   300 mg Oral BID   levothyroxine   150 mcg Oral Q0600   linaclotide   145 mcg Oral QAC breakfast   OLANZapine  zydis  2.5 mg Oral Daily   OLANZapine  zydis  7.5 mg Oral QHS   pantoprazole  (PROTONIX ) IV  40 mg Intravenous Q12H   sodium chloride  flush  10-40 mL Intracatheter Q12H    Imaging and lab data personally reviewed   Author: Sedalia Dacosta  07/02/2023 12:10 PM  To contact [facility]>   Check the care team in [facility] and look for the attending/consulting TR[facility] provider listed  Log into [email or web address] and use [place]'s universal password   Go to> "[facility]"  and find provider  If you still have difficulty reaching the provider, please page the [facility] (Director on Call) for the Hospitalists listed on amion

## 2023-07-02 NOTE — Plan of Care (Signed)

## 2023-07-03 DIAGNOSIS — R112 Nausea with vomiting, unspecified: Secondary | ICD-10-CM | POA: Diagnosis not present

## 2023-07-03 NOTE — Progress Notes (Signed)
 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 Patient was evaluated urology office on 5/5 for follow-up for chronic interstitial nephritis and was recommended to go to the ED for her acute on chronic symptoms.  3/28:1 1. Cystourethroscopy. 2. Urethral calibration. 3. Hydrodistention of the bladder under general anesthesia. 4. Pelvic floor trigger point injection of bupivacaine  and triamcinolone  for myalgia. 5. Bladder installation with phenazopyridine  and bupivacaine . 6. Bladder biopsy with fulguration of base.  In the ED, started on IV fluids, antiemetics and pain medication  Subjective:  Nausea continues to improve.  Would like to advance to solid food.  Pain is not as severe and she is taking more oral pain medicine rather than IV  Assessment and Plan: Principal Problem:   Intractable nausea and vomiting with acute on chronic pelvic pain- - She feels that the cystoscopy done on 3/8 with urinary urgency and burning but did not appear to help with the pain - Her symptoms are multifactorial secondary to interstitial nephritis, possible pelvic floor tightening, stress and anxiety - Twice daily PPI ordered - 5/7> Treat anxiety aggressively, change oral steroids to IV for now and also allow her to have bowel rest plan-have increased IV Dilaudid  for now with the understanding that we will wean quickly once her pain is better controlled - 5/8> start Oxycodone , leave Dilaudid  for breakthrough pain, referral sent to Gyn, Dr Jonel Nephew -5/9> advance to solid food  today  Active Problems:   Hypertensive urgency - Secondary to acute pain, anxiety in addition to chronic hypertension - Continue Avapro  - Added amlodipine  - As needed hydralazine  has been ordered - Hold HCTZ until vomiting improves and she is better hydrated  Adrenal insufficiency - Have changed Solu-Cortef  to hydrocortisone  IV and will continue this until she is tolerating solid food - Have referred her to endocrinology  Hyponatremia and hypokalemia - Sodium 132 and potassium 3.4 - Replaced and improved - As mentioned above, will cont to hold hydrochlorothiazide   Anxiety and depression - Prozac  and Zyprexa  are helping but not completely- cont IV Ativan  BID - previously on Cymbalta and Zoloft  - I have recommended she look into Esketamine treatment-she has scheduled an appointment for later this month  H/o hemorrhagic left ovarian cyst - s/p lysis of adhesions and left ovarian cystectomy    Code Status: Full Code Total time on patient care: 35 minutes DVT prophylaxis:  enoxaparin  (LOVENOX ) injection 40 mg Start: 06/30/23 2200   Objective:   Vitals:   07/02/23 2137 07/03/23 0207 07/03/23 0601 07/03/23 1302  BP: (!) 159/98 134/85 126/88 (!) 153/115  Pulse: 93 77 76 100  Resp: 18 18 18 16   Temp: 97.7 F (36.5 C) 98 F (36.7 C) 97.7 F (36.5 C) 98.1 F (36.7 C)  TempSrc:  Oral  Oral  SpO2: 98% 97% 96% 99%  Weight:      Height:       Filed Weights   06/30/23 1740  Weight: 106.3 kg   Exam: General exam: Appears uncomfortable HEENT: oral mucosa moist Respiratory system: Clear to auscultation.  Cardiovascular system: S1 & S2 heard  Gastrointestinal system: Abdomen  soft, has tenderness in the lower abdomen and suprapubic area nondistended, bowel sounds positive Extremities: No cyanosis, clubbing or edema Psychiatry:  Mood & affect appropriate.   CBC: Recent Labs  Lab 06/29/23 1500 06/30/23 1203 07/01/23 0402  WBC 8.1 9.5 7.3  NEUTROABS 4.7  --   --   HGB  13.3 14.3 13.9  HCT 40.7 43.3 42.2  MCV 95.5 96.0 95.3  PLT 363 390 366   Basic Metabolic Panel: Recent Labs  Lab 06/29/23 1500 06/30/23 1203 07/01/23 0624 07/02/23 0340  NA 137 137 132* 137  K 3.7 3.7 3.4* 4.0  CL 102 99 97* 103  CO2 26 26 26 27   GLUCOSE 81 89 98 117*  BUN 16 16 13 10   CREATININE 0.88 0.83 0.78 0.71  CALCIUM  9.0 9.4 8.9 9.1     Scheduled Meds:  amLODipine   10 mg Oral Daily   enoxaparin  (LOVENOX ) injection  40 mg Subcutaneous QHS   FLUoxetine   60 mg Oral Daily   hydrocortisone  sod succinate (SOLU-CORTEF ) inj  50 mg Intravenous Q8H   hydrOXYzine   50 mg Oral QHS   irbesartan   300 mg Oral BID   levothyroxine   150 mcg Oral Q0600   linaclotide   145 mcg Oral QAC breakfast   OLANZapine  zydis  2.5 mg Oral Daily   OLANZapine  zydis  7.5 mg Oral QHS   pantoprazole  (PROTONIX ) IV  40 mg Intravenous Q12H   sodium chloride  flush  10-40 mL Intracatheter Q12H    Imaging and lab data personally reviewed   Author: Inocente Krach  07/03/2023 6:10 PM  To contact [facility]>   Check the care team in [facility] and look for the attending/consulting [facility] provider listed  Log into [email or web address] and use [place]'s universal password   Go to> "[facility]"  and find provider  If you still have difficulty reaching the provider, please page the [facility] (Director on Call) for the Hospitalists listed on amion

## 2023-07-03 NOTE — Plan of Care (Signed)

## 2023-07-03 NOTE — Progress Notes (Signed)
 Chaplain visited pt in response to nurse's page for pt request for support.Pt welcomed this visit as she was lying in her bed. She grew tearful as she shared her health narrative and stressful factors in her personal life. Pt is feeling especially sad today knowing she will not be able to be at home with her family for Mother's Day again this year, she was also hospitalized on Mother's Day last year. Chaplain provided reflective listening, a compassionate presence and prayer and pt's request. Additional support available vis paging chaplain team- [phone] Jolynn Needy, [place]    07/03/23 1400  Spiritual Encounters  Type of Visit Initial  Care provided to: Patient  Referral source Patient request  Reason for visit Routine spiritual support  OnCall Visit No  Spiritual Framework  Presenting Themes Coping tools;Impactful experiences and emotions  Patient Stress Factors Health changes;Family relationships;Exhausted  Family Stress Factors Financial concerns;Health changes  Interventions  Spiritual Care Interventions Made Established relationship of care and support;Compassionate presence;Reflective listening;Prayer  Intervention Outcomes  Outcomes Awareness around self/spiritual resourses;Reduced anxiety  Spiritual Care Plan  Spiritual Care Issues Still Outstanding No further spiritual care needs at this time (see row info)

## 2023-07-04 ENCOUNTER — Other Ambulatory Visit (HOSPITAL_COMMUNITY): Payer: Self-pay

## 2023-07-04 DIAGNOSIS — R112 Nausea with vomiting, unspecified: Secondary | ICD-10-CM | POA: Diagnosis not present

## 2023-07-04 DIAGNOSIS — R102 Pelvic and perineal pain unspecified side: Secondary | ICD-10-CM | POA: Insufficient documentation

## 2023-07-04 DIAGNOSIS — F419 Anxiety disorder, unspecified: Secondary | ICD-10-CM | POA: Insufficient documentation

## 2023-07-04 MED ORDER — PANTOPRAZOLE SODIUM 40 MG PO TBEC
40.0000 mg | DELAYED_RELEASE_TABLET | Freq: Two times a day (BID) | ORAL | 0 refills | Status: DC | PRN
  Filled 2023-07-04: qty 30, 15d supply, fill #0

## 2023-07-04 MED ORDER — BISACODYL 10 MG RE SUPP
10.0000 mg | RECTAL | 0 refills | Status: DC | PRN
  Filled 2023-07-04: qty 12, 12d supply, fill #0

## 2023-07-04 MED ORDER — OXYCODONE HCL 5 MG PO TABS
5.0000 mg | ORAL_TABLET | ORAL | 0 refills | Status: DC | PRN
  Filled 2023-07-04: qty 10, 2d supply, fill #0

## 2023-07-04 MED ORDER — POLYETHYLENE GLYCOL 3350 17 GM/SCOOP PO POWD
17.0000 g | Freq: Every day | ORAL | 0 refills | Status: DC
  Filled 2023-07-04: qty 238, 14d supply, fill #0

## 2023-07-04 NOTE — Discharge Summary (Addendum)
 Physician Discharge Summary  Krystal [name] UEA:[id number] DOB: 26-Jun-1990 DOA: 06/30/2023  PCP: Patient, No Pcp Per  Admit date: 06/30/2023 Discharge date: 07/04/2023 Discharging to: Home Recommendations for Outpatient Follow-up:  Recommending follow-ups with GYN, endocrinology and psychiatry   Consults:  None Procedures:  None   Discharge Diagnoses:   Principal Problem:   Intractable nausea and vomiting Active Problems:   Hypertensive urgency   Chronic interstitial nephritis   Pelvic pain, chronic   Anxiety and depression    Brief hospital course: This is a 33 year old female with interstitial cystitis, chronic lower abdominal pelvic pain, resistant hypertension, right eye retinal vein occlusion, help syndrome, preeclampsia, 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 Patient was evaluated urology office on 5/5 for follow-up for chronic interstitial nephritis and was recommended to go to the ED for her acute on chronic symptoms.   3/28:1 1. Cystourethroscopy. 2. Urethral calibration. 3. Hydrodistention of the bladder under general anesthesia. 4. Pelvic floor trigger point injection of bupivacaine  and triamcinolone  for myalgia. 5. Bladder installation with phenazopyridine  and bupivacaine . 6. Bladder biopsy with fulguration of base.   In the ED, started on IV fluids, antiemetics and pain medication   Subjective:  Nausea continues to improve.  Would like to advance to solid food.  Pain is not as severe and she is taking more oral pain medicine rather than IV   Assessment and Plan: Principal Problem:   Intractable nausea and vomiting with acute on chronic pelvic pain -nausea and vomiting are usually triggered by increasing episodes of pain at which point she is unable to tolerate her oral medications including Solu-Cortef  - She feels that the cystoscopy done on 3/8 with urinary  urgency and burning but did not appear to help with the pain - Her symptoms are multifactorial secondary to interstitial nephritis, possible pelvic floor tightening, stress and anxiety - Treated with IV antiemetics, IV Dilaudid ,IV Ativan , IV steroids and short acting oxycodone  - as she improved, I slowly increased her back to solid food which she is tolerating well now  -10 tabs of oxycodone  prescribed for her to wean off at home - Referred to GYN for further investigation into pelvic pathology-she will continue to follow-up with Dr. Luster Salters for her interstitial nephritis - Discussed plans with her husband as well   Active Problems:   Hypertensive urgency - Secondary to acute pain, anxiety in addition to chronic hypertension -Hydrochlorothiazide  held while oral intake was poor - ARB continued and amlodipine  added during hospital stay - On discharge, she can continue her Avapro /HCTZ   Adrenal insufficiency - Replaced Solu-Cortef  with hydrocortisone  IV while she was having vomiting - Have referred her to endocrinology   Hyponatremia and hypokalemia - In setting of active vomiting, poor oral intake and HCTZ - Sodium 132 and potassium 3.4 - Replaced and improved   Anxiety and depression - Prozac  and Zyprexa  are helping but not completely- IV Ativan  BID used while in the hospital - previously on Cymbalta and Zoloft  - I have recommended she look into Esketamine and TMS treatment-she has scheduled an appointment for later this month   H/o hemorrhagic left ovarian cyst - s/p lysis of adhesions and left ovarian cystectomy   Obesity class I  Body mass index is 32.69 kg/m. Nutrition Status:    History of left hand amputation      Discharge Instructions  Discharge Instructions     Ambulatory referral to Endocrinology   Complete  by: As directed    Diet general   Complete by: As directed    Increase activity slowly   Complete by: As directed       Allergies as of 07/04/2023        Reactions   Bee Venom Anaphylaxis, Swelling, Other (See Comments)   Swelling of face, throat. Airway restriction.   Coreg  [carvedilol ] Shortness Of Breath, Other (See Comments)   Triggers asthma   Red Dye #40 (allura Red) Other (See Comments)   Migraines   Compazine  [prochlorperazine  Maleate] Other (See Comments)   Twitching Able to tolerate promethazine    Oxycodone  Nausea And Vomiting   Vicodin [hydrocodone -acetaminophen ] Itching, Nausea And Vomiting, Other (See Comments)   OK with Tylenol    Cymbalta [duloxetine Hcl] Other (See Comments)   Irritability  Syncope    Ditropan  [oxybutynin ] Other (See Comments)   Severe dry mouth   Elavil [amitriptyline] Other (See Comments)   Irritability    Motrin  [ibuprofen ] Other (See Comments)   Heartburn    Naprosyn  [naproxen ] Other (See Comments)   Heartburn   Pyrimethamine Other (See Comments)   From Fansidar combination drug Triggers asthma   Sulfadoxine Other (See Comments)   From Fansidar combination drug Triggers asthma    Zestril  [lisinopril ] Cough   Nifedical Xl [nifedipine ] Nausea And Vomiting   Reglan  [metoclopramide ] Swelling, Other (See Comments)   Restlessness Able to take with Benadryl    Trandate  [labetalol ] Nausea And Vomiting, Other (See Comments)   Triggers asthma         Medication List     STOP taking these medications    BD Disp Needles 18G X 1-1/2" Misc Generic drug: NEEDLE (DISP) 18 G       TAKE these medications    acetaminophen  500 MG tablet Commonly known as: TYLENOL  Take 1,000 mg by mouth 3 (three) times daily as needed (for pain or headaches).   albuterol  108 (90 Base) MCG/ACT inhaler Commonly known as: Ventolin  HFA Inhale 2 puffs into the lungs every 4 (four) hours as needed for wheezing or shortness of breath.   Benadryl  Allergy 25 MG capsule Generic drug: diphenhydrAMINE  Take 25 mg by mouth every 6 (six) hours as needed for allergies or itching.   bisacodyl  10 MG suppository Commonly  known as: Dulcolax Place 1 suppository (10 mg total) rectally as needed for moderate constipation.   bupivacaine (PF) 0.5 % Soln injection Commonly known as: MARCAINE  Instill 15 mLs into bladder daily with heparin .   EPINEPHrine  0.3 mg/0.3 mL Soaj injection Commonly known as: EPI-PEN Inject 0.3 mg into the muscle as needed for anaphylaxis.   FLUoxetine  20 MG capsule Commonly known as: PROZAC  Take 3 capsules (60 mg total) by mouth daily.   heparin  10000 UNIT/ML injection Instill 4 mLs (40,000 Units total) into the bladder daily with bupivacaine .   hydrocortisone  10 MG tablet Commonly known as: CORTEF  Take 10-30 mg by mouth See admin instructions. Take 30 mg by mouth in the morning and 10 mg at bedtime   hydrOXYzine  25 MG tablet Commonly known as: ATARAX  Take 50 mg by mouth at bedtime.   irbesartan -hydrochlorothiazide  150-12.5 MG tablet Commonly known as: AVALIDE Take 2 tablets by mouth in the morning and at bedtime.   levothyroxine  150 MCG tablet Commonly known as: SYNTHROID  Take 1 tablet (150 mcg total) by mouth daily at 6 (six) AM.   linaclotide  145 MCG Caps capsule Commonly known as: LINZESS  Take 1 capsule (145 mcg total) by mouth daily before breakfast.   MONOJECT 20CC SYRINGE  LUER LOK 20 ML Misc Generic drug: Syringe (Disposable) Use as directed for daily bladder instillations   NON FORMULARY Take 1 capsule by mouth See admin instructions. Beef organ capsules- Take 1 capsule by mouth once a day   OLANZapine  7.5 MG tablet Commonly known as: ZyPREXA  Take 1 tablet (7.5 mg total) by mouth at bedtime.   OLANZapine  2.5 MG tablet Commonly known as: ZYPREXA  Take 1 tablet (2.5 mg total) by mouth daily.   ondansetron  4 MG disintegrating tablet Commonly known as: ZOFRAN -ODT Take 4 mg by mouth every 6 (six) hours as needed for nausea or vomiting (dissolve orally).   oxyCODONE  5 MG immediate release tablet Commonly known as: Oxy IR/ROXICODONE  Take 1 tablet (5 mg  total) by mouth every 4 (four) hours as needed for severe pain (pain score 7-10).   pantoprazole  40 MG tablet Commonly known as: PROTONIX  Take 1 tablet (40 mg total) by mouth 2 (two) times daily as needed (for heartburn).   polyethylene glycol powder 17 GM/SCOOP powder Commonly known as: GLYCOLAX /MIRALAX  Mix 17 grams in beverage and take by mouth daily as directed.   promethazine  25 MG suppository Commonly known as: PHENERGAN  Place 1 suppository (25 mg total) rectally every 6 (six) hours as needed for nausea or vomiting.   RASPBERRY PO Take 2 capsules by mouth See admin instructions. Raspberry Leaf capsules- Take 2 capsules by mouth once a day   traMADol  50 MG tablet Commonly known as: ULTRAM  Take 1 tablet (50 mg total) by mouth every 12 (twelve) hours as needed for moderate pain (pain score 4-6).        Follow-up Information     Gordy Lauber, MD Follow up.   Specialty: Endocrinology Contact information: [address] [place] [address] [phone]         Jorie Newness, Blondie Burke, MD Follow up.   Specialty: Obstetrics and Gynecology Contact information: [address] [place] [address] [phone]         Arlen Belton, MD Follow up.   Specialty: Psychiatry Contact information: [address] [place] [address]                    The results of significant diagnostics from this hospitalization (including imaging, microbiology, ancillary and laboratory) are listed below for reference.    CT Head Wo Contrast Result Date: 06/30/2023 CLINICAL DATA:  headache, vision changes, htn. EXAM: CT HEAD WITHOUT CONTRAST TECHNIQUE: Contiguous axial images were obtained from the base of the skull through the vertex without intravenous contrast. RADIATION DOSE REDUCTION: This exam was performed according to the departmental dose-optimization program which includes automated exposure control,  adjustment of the mA and/or kV according to patient size and/or use of iterative reconstruction technique. COMPARISON:  MRI head from 05/04/2022. FINDINGS: Brain: No evidence of acute infarction, hemorrhage, hydrocephalus, extra-axial collection or mass lesion/mass effect. Ventricles are normal. Cerebral volume is age appropriate. Vascular: No hyperdense vessel or unexpected calcification. Skull: Normal. Negative for fracture or focal lesion. Sinuses/Orbits: No acute finding. Other: Visualized mastoid air cells are unremarkable. No mastoid effusion. IMPRESSION: *No acute intracranial abnormality. Electronically Signed   By: Beula Brunswick M.D.   On: 06/30/2023 13:36   CT ABDOMEN PELVIS W CONTRAST Result Date: 06/30/2023 CLINICAL DATA:  abdominal pain, n/v. EXAM: CT ABDOMEN AND PELVIS WITH CONTRAST TECHNIQUE: Multidetector CT imaging of the abdomen and pelvis was performed using the standard protocol following bolus administration of intravenous  contrast. RADIATION DOSE REDUCTION: This exam was performed according to the departmental dose-optimization program which includes automated exposure control, adjustment of the mA and/or kV according to patient size and/or use of iterative reconstruction technique. CONTRAST:  OMNIPAQUE  IOHEXOL  300 MG/ML  SOLN COMPARISON:  CT scan abdomen and pelvis from 03/13/2023. FINDINGS: Lower chest: There are dependent changes in the visualized lung bases. No overt consolidation. No pleural effusion. The heart is normal in size. No pericardial effusion. Hepatobiliary: The liver is normal in size. Non-cirrhotic configuration. No suspicious mass. No intrahepatic or extrahepatic bile duct dilation. Gallbladder is surgically absent. Pancreas: Unremarkable. No pancreatic ductal dilatation or surrounding inflammatory changes. Spleen: Lobulated spleen however, size within normal limits. No focal mass. Adrenals/Urinary Tract: Adrenal glands are unremarkable. No suspicious renal mass.  There is a subcentimeter sized hypoattenuating focus in the left kidney lower pole, which is too small to adequately characterize. No hydronephrosis. No renal or ureteric calculi. Urinary bladder is under distended, precluding optimal assessment. However, no large mass or stones identified. No perivesical fat stranding. Stomach/Bowel: There is a small sliding hiatal hernia. No disproportionate dilation of the small or large bowel loops. No evidence of abnormal bowel wall thickening or inflammatory changes. The appendix was not visualized; however there is no acute inflammatory process in the right lower quadrant. Vascular/Lymphatic: No ascites or pneumoperitoneum. No abdominal or pelvic lymphadenopathy, by size criteria. No aneurysmal dilation of the major abdominal arteries. Reproductive: The uterus is unremarkable. The ovaries are unremarkable. Other: There is a tiny fat containing umbilical hernia. The soft tissues and abdominal wall are otherwise unremarkable. Musculoskeletal: No suspicious osseous lesions. There are mild multilevel degenerative changes in the visualized spine. L5 limbus vertebra noted. IMPRESSION: 1. No acute inflammatory process identified within the abdomen or pelvis. 2. Multiple other nonacute observations, as described above. Electronically Signed   By: Beula Brunswick M.D.   On: 06/30/2023 13:35   Labs:   Basic Metabolic Panel: Recent Labs  Lab 06/29/23 1500 06/30/23 1203 07/01/23 0624 07/02/23 0340  NA 137 137 132* 137  K 3.7 3.7 3.4* 4.0  CL 102 99 97* 103  CO2 26 26 26 27   GLUCOSE 81 89 98 117*  BUN 16 16 13 10   CREATININE 0.88 0.83 0.78 0.71  CALCIUM  9.0 9.4 8.9 9.1     CBC: Recent Labs  Lab 06/29/23 1500 06/30/23 1203 07/01/23 0402  WBC 8.1 9.5 7.3  NEUTROABS 4.7  --   --   HGB 13.3 14.3 13.9  HCT 40.7 43.3 42.2  MCV 95.5 96.0 95.3  PLT 363 390 366         SIGNED:   Sedalia Dacosta, MD  [facility] 07/04/2023, 5:00 PM Time taking on  discharge: 50 minutes

## 2023-07-04 NOTE — Plan of Care (Signed)

## 2023-07-05 ENCOUNTER — Other Ambulatory Visit: Payer: Self-pay

## 2023-07-05 ENCOUNTER — Emergency Department (HOSPITAL_COMMUNITY)
Admission: EM | Admit: 2023-07-05 | Discharge: 2023-07-06 | Disposition: A | Discharge status: Home / Self Care | Payer: MEDICAID | Attending: Emergency Medicine | Admitting: Emergency Medicine

## 2023-07-05 DIAGNOSIS — R112 Nausea with vomiting, unspecified: Secondary | ICD-10-CM | POA: Diagnosis present

## 2023-07-05 DIAGNOSIS — I10 Essential (primary) hypertension: Secondary | ICD-10-CM | POA: Diagnosis not present

## 2023-07-05 DIAGNOSIS — R509 Fever, unspecified: Secondary | ICD-10-CM | POA: Diagnosis not present

## 2023-07-05 DIAGNOSIS — R1084 Generalized abdominal pain: Secondary | ICD-10-CM

## 2023-07-05 DIAGNOSIS — G8929 Other chronic pain: Secondary | ICD-10-CM | POA: Diagnosis not present

## 2023-07-05 NOTE — ED Triage Notes (Signed)
 Pt reports that she is having abdominal pain, bilateral flanks, n/v, dizziness, intermittent fevers. Symptoms started yesterday. Recent admission for HTN and bladder pain. Unable to keep down her b/p medications d/t vomiting.

## 2023-07-06 ENCOUNTER — Other Ambulatory Visit (HOSPITAL_COMMUNITY): Payer: Self-pay

## 2023-07-06 ENCOUNTER — Emergency Department (HOSPITAL_COMMUNITY): Payer: MEDICAID

## 2023-07-06 DIAGNOSIS — R1084 Generalized abdominal pain: Secondary | ICD-10-CM | POA: Diagnosis not present

## 2023-07-06 DIAGNOSIS — G8929 Other chronic pain: Secondary | ICD-10-CM | POA: Diagnosis not present

## 2023-07-06 LAB — RESP PANEL BY RT-PCR (RSV, FLU A&B, COVID)  RVPGX2
Influenza A by PCR: NEGATIVE
Influenza B by PCR: NEGATIVE
Resp Syncytial Virus by PCR: NEGATIVE
SARS Coronavirus 2 by RT PCR: NEGATIVE

## 2023-07-06 LAB — MAGNESIUM: Magnesium: 2.6 mg/dL — ABNORMAL HIGH (ref 1.7–2.4)

## 2023-07-06 LAB — URINALYSIS, ROUTINE W REFLEX MICROSCOPIC
Bilirubin Urine: NEGATIVE
Glucose, UA: NEGATIVE mg/dL
Ketones, ur: NEGATIVE mg/dL
Leukocytes,Ua: NEGATIVE
Nitrite: NEGATIVE
Protein, ur: NEGATIVE mg/dL
Specific Gravity, Urine: 1.013 (ref 1.005–1.030)
pH: 6 (ref 5.0–8.0)

## 2023-07-06 LAB — CBC
HCT: 38 % (ref 36.0–46.0)
Hemoglobin: 12.7 g/dL (ref 12.0–15.0)
MCH: 32 pg (ref 26.0–34.0)
MCHC: 33.4 g/dL (ref 30.0–36.0)
MCV: 95.7 fL (ref 80.0–100.0)
Platelets: 235 10*3/uL (ref 150–400)
RBC: 3.97 MIL/uL (ref 3.87–5.11)
RDW: 13.3 % (ref 11.5–15.5)
WBC: 5.5 10*3/uL (ref 4.0–10.5)
nRBC: 0 % (ref 0.0–0.2)

## 2023-07-06 LAB — COMPREHENSIVE METABOLIC PANEL WITH GFR
ALT: 22 U/L (ref 0–44)
AST: 29 U/L (ref 15–41)
Albumin: 3.5 g/dL (ref 3.5–5.0)
Alkaline Phosphatase: 65 U/L (ref 38–126)
Anion gap: 8 (ref 5–15)
BUN: 22 mg/dL — ABNORMAL HIGH (ref 6–20)
CO2: 28 mmol/L (ref 22–32)
Calcium: 9.1 mg/dL (ref 8.9–10.3)
Chloride: 100 mmol/L (ref 98–111)
Creatinine, Ser: 0.94 mg/dL (ref 0.44–1.00)
GFR, Estimated: >60 mL/min (ref >60)
Glucose, Bld: 127 mg/dL — ABNORMAL HIGH (ref 70–99)
Potassium: 3.7 mmol/L (ref 3.5–5.1)
Sodium: 136 mmol/L (ref 135–145)
Total Bilirubin: 0.6 mg/dL (ref 0.0–1.2)
Total Protein: 6.6 g/dL (ref 6.5–8.1)

## 2023-07-06 LAB — LIPASE, BLOOD: Lipase: 24 U/L (ref 11–51)

## 2023-07-06 LAB — HCG, SERUM, QUALITATIVE: Preg, Serum: NEGATIVE

## 2023-07-06 LAB — I-STAT CG4 LACTIC ACID, ED: Lactic Acid, Venous: 1 mmol/L (ref 0.5–1.9)

## 2023-07-06 MED ORDER — LACTATED RINGERS IV BOLUS (SEPSIS)
1000.0000 mL | Freq: Once | INTRAVENOUS | Status: AC
  Administered 2023-07-06: 1000 mL via INTRAVENOUS

## 2023-07-06 MED ORDER — SODIUM CHLORIDE (PF) 0.9 % IJ SOLN
INTRAMUSCULAR | Status: AC
  Filled 2023-07-06: qty 50

## 2023-07-06 MED ORDER — IOHEXOL 300 MG/ML  SOLN
100.0000 mL | Freq: Once | INTRAMUSCULAR | Status: AC | PRN
  Administered 2023-07-06: 100 mL via INTRAVENOUS

## 2023-07-06 MED ORDER — ONDANSETRON HCL 4 MG/2ML IJ SOLN
4.0000 mg | Freq: Once | INTRAMUSCULAR | Status: AC
  Administered 2023-07-06: 4 mg via INTRAVENOUS
  Filled 2023-07-06: qty 2

## 2023-07-06 MED ORDER — FENTANYL CITRATE PF 50 MCG/ML IJ SOSY
50.0000 ug | PREFILLED_SYRINGE | Freq: Once | INTRAMUSCULAR | Status: AC
  Administered 2023-07-06: 50 ug via INTRAVENOUS
  Filled 2023-07-06: qty 1

## 2023-07-06 MED ORDER — MORPHINE SULFATE (PF) 4 MG/ML IV SOLN
4.0000 mg | Freq: Once | INTRAVENOUS | Status: AC
  Administered 2023-07-06: 4 mg via INTRAVENOUS
  Filled 2023-07-06: qty 1

## 2023-07-06 MED ORDER — HYDROCORTISONE SOD SUC (PF) 100 MG IJ SOLR
30.0000 mg | Freq: Once | INTRAMUSCULAR | Status: AC
  Administered 2023-07-06: 30 mg via INTRAVENOUS
  Filled 2023-07-06: qty 2

## 2023-07-06 MED ORDER — ONDANSETRON HCL 4 MG PO TABS
4.0000 mg | ORAL_TABLET | Freq: Four times a day (QID) | ORAL | 0 refills | Status: DC
  Filled 2023-07-06: qty 12, 3d supply, fill #0

## 2023-07-06 NOTE — ED Notes (Signed)
 This rn stuck pt x 2 for lab draw without success. Pt says they usually have to do IV ultrasound for line and labs

## 2023-07-06 NOTE — ED Provider Notes (Signed)
 [place] EMERGENCY DEPARTMENT AT [facility] Provider Note   CSN: [id number] Arrival date & time: 07/05/23  2321     History  Chief Complaint  Patient presents with   Abdominal Pain    Kelisha C Housman is a 33 y.o. female.   Abdominal Pain Associated symptoms: nausea and vomiting   Patient presents for multiple complaints.  Medical history includes HTN, GERD, fibromyalgia, anxiety, depression, borderline personality disorder, migraines, chronic interstitial cystitis, IBS, THC use.  She was recently admitted to the hospital for hypertension and refractory nausea and vomiting.  She was discharged from the hospital on Friday.  She states that she had a fever of 102 degrees Friday night.  She had recurrence of fever this morning.  She took Tylenol .  Throughout the day today, she has had nausea, vomiting, bilateral flank pain, lower back pain, dizziness.  She states that her blood pressure has been low.  She did take her blood pressure medications today.  She typically takes 30 mg of Cortef  in the morning and 10 mg at night.  She states that she did take her dose today but subsequently threw up.      Home Medications Prior to Admission medications   Medication Sig Start Date End Date Taking? Authorizing Provider  acetaminophen  (TYLENOL ) 500 MG tablet Take 1,000 mg by mouth 3 (three) times daily as needed (for pain or headaches).    [provider]  albuterol  (VENTOLIN  HFA) 108 (90 Base) MCG/ACT inhaler Inhale 2 puffs into the lungs every 4 (four) hours as needed for wheezing or shortness of breath. 10/15/22   Trudy Fusi, DO  BENADRYL  ALLERGY 25 MG capsule Take 25 mg by mouth every 6 (six) hours as needed for allergies or itching.    [provider]  bisacodyl  (DULCOLAX) 10 MG suppository Place 1 suppository (10 mg total) rectally as needed for moderate constipation. 07/04/23   Rizwan, Saima, MD  bupivacaine ,PF, (MARCAINE ) 0.5 % SOLN injection Instill 15 mLs  into bladder daily with heparin . 04/06/23     EPINEPHrine  0.3 mg/0.3 mL IJ SOAJ injection Inject 0.3 mg into the muscle as needed for anaphylaxis. 01/15/21   Trudy Fusi, DO  FLUoxetine  (PROZAC ) 20 MG capsule Take 3 capsules (60 mg total) by mouth daily. 05/15/23 07/14/23  Buena Carmine, NP  heparin  10000 UNIT/ML injection Instill 4 mLs (40,000 Units total) into the bladder daily with bupivacaine . 04/06/23     hydrocortisone  (CORTEF ) 10 MG tablet Take 10-30 mg by mouth See admin instructions. Take 30 mg by mouth in the morning and 10 mg at bedtime    [provider]  hydrOXYzine  (ATARAX ) 25 MG tablet Take 50 mg by mouth at bedtime.    [provider]  irbesartan -hydrochlorothiazide  (AVALIDE) 150-12.5 MG tablet Take 2 tablets by mouth in the morning and at bedtime. 10/06/22 10/06/23  Cathey Clunes, MD  levothyroxine  (SYNTHROID ) 150 MCG tablet Take 1 tablet (150 mcg total) by mouth daily at 6 (six) AM. 10/06/22 10/06/23  Cathey Clunes, MD  linaclotide  (LINZESS ) 145 MCG CAPS capsule Take 1 capsule (145 mcg total) by mouth daily before breakfast. 04/08/23 06/30/23  Dalene Duck, MD  NON FORMULARY Take 1 capsule by mouth See admin instructions. Beef organ capsules- Take 1 capsule by mouth once a day    [provider]  OLANZapine  (ZYPREXA ) 2.5 MG tablet Take 1 tablet (2.5 mg total) by mouth daily. 05/15/23   Buena Carmine, NP  OLANZapine  (ZYPREXA ) 7.5 MG  tablet Take 1 tablet (7.5 mg total) by mouth at bedtime. 05/15/23 07/14/23  Buena Carmine, NP  ondansetron  (ZOFRAN -ODT) 4 MG disintegrating tablet Take 4 mg by mouth every 6 (six) hours as needed for nausea or vomiting (dissolve orally).    [provider]  oxyCODONE  (OXY IR/ROXICODONE ) 5 MG immediate release tablet Take 1 tablet (5 mg total) by mouth every 4 (four) hours as needed for severe pain (pain score 7-10). 07/04/23   Rizwan, Saima, MD  pantoprazole  (PROTONIX ) 40 MG tablet Take 1 tablet  (40 mg total) by mouth 2 (two) times daily as needed (for heartburn). 07/04/23 08/03/23  Rizwan, Saima, MD  polyethylene glycol powder (GLYCOLAX /MIRALAX ) 17 GM/SCOOP powder Mix 17 grams in beverage and take by mouth daily as directed. 07/04/23   Rizwan, Saima, MD  promethazine  (PHENERGAN ) 25 MG suppository Place 1 suppository (25 mg total) rectally every 6 (six) hours as needed for nausea or vomiting. 04/27/23 06/30/23  Ezenduka, Nkeiruka J, MD  RASPBERRY PO Take 2 capsules by mouth See admin instructions. Raspberry Leaf capsules- Take 2 capsules by mouth once a day    [provider]  Syringe, Disposable, 20 ML MISC Use as directed for daily bladder instillations 04/06/23     traMADol  (ULTRAM ) 50 MG tablet Take 1 tablet (50 mg total) by mouth every 12 (twelve) hours as needed for moderate pain (pain score 4-6). 04/27/23   Ezenduka, Nkeiruka J, MD      Allergies    Bee venom, Coreg  [carvedilol ], Red dye #40 (allura red), Compazine  [prochlorperazine  maleate], Oxycodone , Vicodin [hydrocodone -acetaminophen ], Cymbalta [duloxetine hcl], Ditropan  [oxybutynin ], Elavil [amitriptyline], Motrin  [ibuprofen ], Naprosyn  [naproxen ], Pyrimethamine, Sulfadoxine, Zestril  [lisinopril ], Nifedical xl [nifedipine ], Reglan  [metoclopramide ], and Trandate  [labetalol ]    Review of Systems   Review of Systems  Gastrointestinal:  Positive for abdominal pain, nausea and vomiting.  Genitourinary:  Positive for flank pain.  Musculoskeletal:  Positive for back pain.  All other systems reviewed and are negative.   Physical Exam Updated Vital Signs BP 109/72   Pulse 75   Temp 97.6 F (36.4 C) (Oral)   Resp 16   LMP 06/22/2023 Comment: pregnancy waiver formed signed 06-30-2023  SpO2 95%  Physical Exam Vitals and nursing note reviewed.  Constitutional:      General: She is not in acute distress.    Appearance: She is well-developed. She is not ill-appearing, toxic-appearing or diaphoretic.  HENT:     Head: Normocephalic  and atraumatic.     Mouth/Throat:     Mouth: Mucous membranes are moist.  Eyes:     Extraocular Movements: Extraocular movements intact.     Conjunctiva/sclera: Conjunctivae normal.  Cardiovascular:     Rate and Rhythm: Normal rate and regular rhythm.  Pulmonary:     Effort: Pulmonary effort is normal. No respiratory distress.  Abdominal:     General: There is no distension.     Palpations: Abdomen is soft.     Tenderness: There is generalized abdominal tenderness. There is left CVA tenderness. There is no guarding or rebound.  Musculoskeletal:        General: No swelling.     Cervical back: Neck supple.  Skin:    General: Skin is warm and dry.  Neurological:     General: No focal deficit present.     Mental Status: She is alert and oriented to person, place, and time.  Psychiatric:        Mood and Affect: Mood normal.  Behavior: Behavior normal.     ED Results / Procedures / Treatments   Labs (all labs ordered are listed, but only abnormal results are displayed) Labs Reviewed  COMPREHENSIVE METABOLIC PANEL WITH GFR - Abnormal; Notable for the following components:      Result Value   Glucose, Bld 127 (*)    BUN 22 (*)    All other components within normal limits  URINALYSIS, ROUTINE W REFLEX MICROSCOPIC - Abnormal; Notable for the following components:   Hgb urine dipstick MODERATE (*)    Bacteria, UA RARE (*)    All other components within normal limits  MAGNESIUM  - Abnormal; Notable for the following components:   Magnesium  2.6 (*)    All other components within normal limits  RESP PANEL BY RT-PCR (RSV, FLU A&B, COVID)  RVPGX2  LIPASE, BLOOD  CBC  HCG, SERUM, QUALITATIVE  I-STAT CG4 LACTIC ACID, ED    EKG EKG Interpretation Date/Time:  Monday Jul 06 2023 04:14:58 EDT Ventricular Rate:  72 PR Interval:  151 QRS Duration:  120 QT Interval:  427 QTC Calculation: 468 R Axis:   22  Text Interpretation: Sinus rhythm Incomplete left bundle branch block  Probable left ventricular hypertrophy Confirmed by Iva Mariner ([id number]) on 07/06/2023 5:07:04 AM  Radiology No results found.  Procedures Procedures    Medications Ordered in ED Medications  lactated ringers  bolus 1,000 mL (1,000 mLs Intravenous New Bag/Given 07/06/23 0409)  ondansetron  (ZOFRAN ) injection 4 mg (4 mg Intravenous Given 07/06/23 0407)  fentaNYL  (SUBLIMAZE ) injection 50 mcg (50 mcg Intravenous Given 07/06/23 0408)    ED Course/ Medical Decision Making/ A&P                                 Medical Decision Making Amount and/or Complexity of Data Reviewed Labs: ordered. Radiology: ordered.  Risk Prescription drug management.   This patient presents to the ED for concern of multiple complaints, this involves an extensive number of treatment options, and is a complaint that carries with it a high risk of complications and morbidity.  The differential diagnosis includes dehydration, metabolic derangement, infection, constipation, polypharmacy, medication withdrawal   Co morbidities that complicate the patient evaluation  HTN, GERD, fibromyalgia, anxiety, depression, borderline personality disorder, migraines, chronic interstitial cystitis, IBS, THC use   Additional history obtained:  Additional history obtained from N/A External records from outside source obtained and reviewed including EMR   Lab Tests:  I Ordered, and personally interpreted labs.  The pertinent results include: Normal kidney function, hypomagnesemia with otherwise normal electrolytes, normal hemoglobin, no leukocytosis, no evidence of UTI, normal hepatobiliary enzymes   Imaging Studies ordered:  I ordered imaging studies including CT of abdomen and pelvis I independently visualized and interpreted imaging which showed (pending at time of signout) I agree with the radiologist interpretation   Cardiac Monitoring: / EKG:  The patient was maintained on a cardiac monitor.  I personally viewed and  interpreted the cardiac monitored which showed an underlying rhythm of: Sinus rhythm   Problem List / ED Course / Critical interventions / Medication management  Patient presenting for multiple complaints.  Had a recent admission for nausea, vomiting, and hypertension.  States that she has had intermittent fevers, bilateral flank pain, lower back pain, nausea, vomiting, dizziness.  Vital signs on arrival are normal.  Patient is well-appearing on exam.  Her abdomen is soft.  She states that she does have tenderness in  bilateral flanks.  She endorses left CVA tenderness.  IV fluids ordered for hydration.  Zofran  was ordered for nausea.  Fentanyl  was ordered for analgesia.  Workup was initiated.  Urinalysis not consistent with UTI.  Serum lab work was unremarkable.  CT scan of abdomen and pelvis was pending at time of signout.  Care of patient was signed out to oncoming ED provider. I ordered medication including IV fluids for hydration; fentanyl  for analgesia; Zofran  for nausea Reevaluation of the patient after these medicines showed that the patient improved I have reviewed the patients home medicines and have made adjustments as needed   Social Determinants of Health:  Frequent ED visits        Final Clinical Impression(s) / ED Diagnoses Final diagnoses:  Nausea and vomiting, unspecified vomiting type    Rx / DC Orders ED Discharge Orders     None         Iva Mariner, MD 07/06/23 [phone]

## 2023-07-06 NOTE — Discharge Instructions (Signed)
 Return for any problem.  ?

## 2023-07-06 NOTE — Consult Note (Signed)
 Initial Consultation Note   Patient: Krystal [name] UXL:[id number] DOB: [birth year] PCP: Patient, No Pcp Per DOA: 07/05/2023 DOS: the patient was seen and examined on 07/06/2023 Primary service: Burnette Carte, MD  Referring physician: Reba Camper Reason for consult: Left hospital Friday.  Here with acute on chronic abdominal pain, n/v.  T102 Friday night, felt terrible all weekend.  Labs and vitals normal.  CT A/P normal.  Assessment/Plan:   Intractable nausea and vomiting with acute on chronic pelvic pain N/V are usually triggered by increasing episodes of pain at which point she is unable to tolerate her oral medications including Solu-Cortef  She feels that the cystoscopy done on 3/8 with urinary urgency and burning but did not appear to help with the pain Her symptoms are multifactorial secondary to interstitial nephritis, possible pelvic floor tightening, stress and anxiety Recently hospitalized and treated with IV antiemetics, IV Dilaudid ,IV Ativan , IV steroids and short acting oxycodone  Given 10 tabs of oxycodone  prescribed for her to wean off at home Referred to GYN for further investigation into pelvic pathology-she will continue to follow-up with Dr. Luster Salters for her interstitial nephritis Returned with ongoing/recurrent symptoms Able to tolerate some PO in the ER Normal labs and vitals No indication for recurrent hospitalization at this time  HTN Continue Avapro /HCTZ   Adrenal insufficiency Replaced Solu-Cortef  with hydrocortisone  IV while she was having vomiting during last hospitalization Given 1 dose of IV here Previously referred to endocrinology She is requesting a cortisol level now but would recommend deferring to encodrinology   Anxiety and depression Prozac  and Zyprexa  are helping but not completely- IV Ativan  BID used while in the hospital Previously on Cymbalta and Zoloft  She was recommended to look into Esketamine and TMS treatment during her last  hospitalization and she has scheduled an appointment for later this month   H/o hemorrhagic left ovarian cyst s/p lysis of adhesions and left ovarian cystectomy   Class 1 obesity There is no height or weight on file to calculate BMI..  Weight loss should be encouraged Outpatient PCP/bariatric medicine f/u encouraged Significantly low or high BMI is associated with higher medical risk including morbidity and mortality      Thank you for this interesting consult.  She does not appear to require repeat hospitalization at this time.    HPI: Krystal [name] is a 33 y.o. female with past medical history of anxiety/depression, interstitial cystitis, hypothyroidism, HTN, and PTSD who presented with abdominal pain.  She reports recurrence of symptoms through the weekend with n/v and abdominal pain.  She is concerned that her cortisol level needs to be checked.   Review of Systems: As mentioned in the history of present illness. All other systems reviewed and are negative. Past Medical History:  Diagnosis Date   Acute acalculous cholecystitis s/p lap cholecystectomy 05/11/2020 05/11/2020   Angio-edema 01/15/2021   Anxiety    Asthma    Chronic interstitial cystitis    Depression    HELLP (hemolytic anemia/elev liver enzymes/low platelets in pregnancy) 12/08/2017   Hemorrhagic cyst of left ovary 04/15/2022   History of borderline personality disorder    History of HELLP syndrome, currently pregnant 04/21/2019   Formatting of this note might be different from the original.  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   Severe uncontrolled hypertension 10/08/2017   UTI (urinary tract infection)    Past Surgical History:  Procedure Laterality Date   APPENDECTOMY     BIOPSY  05/10/2020   Procedure: BIOPSY;  Surgeon: Evangeline Hilts, MD;  Location: [facility] ENDOSCOPY;  Service: Endoscopy;;   BIOPSY  05/02/2022   Procedure: BIOPSY;  Surgeon: Ace Holder, MD;  Location: [facility] ENDOSCOPY;  Service: Gastroenterology;;   CESAREAN SECTION N/A 12/06/2017   Procedure: CESAREAN SECTION;  Surgeon: Jan Mcgill, MD;  Location: [facility] BIRTHING SUITES;  Service: Obstetrics;  Laterality: N/A;   CESAREAN SECTION  07/21/2021   Procedure: CESAREAN SECTION;  Surgeon: Granville Layer, MD;  Location: [facility] LD ORS;  Service: Obstetrics;;   CHOLECYSTECTOMY N/A 05/11/2020   Procedure: SINGLE SITE LAPAROSCOPIC CHOLECYSTECTOMY AND LIVER BIOSY;  Surgeon: Candyce Champagne, MD;  Location: [facility] ORS;  Service: General;  Laterality: N/A;   ESOPHAGOGASTRODUODENOSCOPY (EGD) WITH PROPOFOL  N/A 05/10/2020   Procedure: ESOPHAGOGASTRODUODENOSCOPY (EGD) WITH PROPOFOL ;  Surgeon: Evangeline Hilts, MD;  Location: [facility] ENDOSCOPY;  Service: Endoscopy;  Laterality: N/A;   FLEXIBLE SIGMOIDOSCOPY N/A 05/02/2022   Procedure: FLEXIBLE SIGMOIDOSCOPY;  Surgeon: Ace Holder, MD;  Location: 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 She has never used smokeless tobacco. She reports current alcohol use. She reports that she does not use drugs.  Allergies  Allergen Reactions   Bee Venom Anaphylaxis, Swelling and Other (See Comments)    Swelling of face, throat. Airway restriction.   Coreg  [Carvedilol ] Shortness Of Breath and Other (See Comments)    Triggers asthma   Red Dye #40 (Allura Red) Other (See Comments)    Migraines   Compazine  [Prochlorperazine  Maleate] Other (See Comments)    Twitching Able to tolerate promethazine    Oxycodone  Nausea And Vomiting   Vicodin [Hydrocodone -Acetaminophen ] Itching, Nausea And Vomiting and Other (See Comments)    OK with Tylenol    Cymbalta [Duloxetine Hcl] Other (See Comments)    Irritability  Syncope    Ditropan  [Oxybutynin ] Other (See Comments)    Severe dry mouth   Elavil [Amitriptyline] Other (See Comments)    Irritability    Motrin  [Ibuprofen ] Other (See Comments)    Heartburn    Naprosyn  [Naproxen ] Other (See Comments)    Heartburn    Pyrimethamine Other (See Comments)    From Fansidar combination drug Triggers asthma   Sulfadoxine Other (See Comments)    From Fansidar combination drug Triggers asthma    Zestril  [Lisinopril ] Cough   Nifedical Xl [Nifedipine ] Nausea And Vomiting   Reglan  [Metoclopramide ] Swelling  and Other (See Comments)    Restlessness Able to take with Benadryl    Trandate  [Labetalol ] Nausea And Vomiting and Other (See Comments)    Triggers asthma     Family History  Problem Relation Age of Onset   Hypertension Mother    Coronary artery disease Mother    Hypertension Father    Stroke Father    Atrial fibrillation Father    Heart failure Maternal Grandfather    Heart attack Maternal Grandfather     Prior to Admission medications   Medication Sig Start Date End Date Taking? Authorizing Provider  acetaminophen  (TYLENOL ) 500 MG tablet Take 1,000 mg by mouth 3  (three) times daily as needed (for pain or headaches).    [provider]  albuterol  (VENTOLIN  HFA) 108 (90 Base) MCG/ACT inhaler Inhale 2 puffs into the lungs every 4 (four) hours as needed for wheezing or shortness of breath. 10/15/22   Trudy Fusi, DO  BENADRYL  ALLERGY 25 MG capsule Take 25 mg by mouth every 6 (six) hours as needed for allergies or itching.    [provider]  bisacodyl  (DULCOLAX) 10 MG suppository Place 1 suppository (10 mg total) rectally as needed for moderate constipation. 07/04/23   Rizwan, Saima, MD  bupivacaine ,PF, (MARCAINE ) 0.5 % SOLN injection Instill 15 mLs into bladder daily with heparin . 04/06/23     EPINEPHrine  0.3 mg/0.3 mL IJ SOAJ injection Inject 0.3 mg into the muscle as needed for anaphylaxis. 01/15/21   Trudy Fusi, DO  FLUoxetine  (PROZAC ) 20 MG capsule Take 3 capsules (60 mg total) by mouth daily. 05/15/23 07/14/23  Buena Carmine, NP  heparin  11914 UNIT/ML injection Instill 4 mLs (40,000 Units total) into the bladder daily with bupivacaine . 04/06/23     hydrocortisone  (CORTEF ) 10 MG tablet Take 10-30 mg by mouth See admin instructions. Take 30 mg by mouth in the morning and 10 mg at bedtime    [provider]  hydrOXYzine  (ATARAX ) 25 MG tablet Take 50 mg by mouth at bedtime.    [provider]  irbesartan -hydrochlorothiazide  (AVALIDE) 150-12.5 MG tablet Take 2 tablets by mouth in the morning and at bedtime. 10/06/22 10/06/23  Cathey Clunes, MD  levothyroxine  (SYNTHROID ) 150 MCG tablet Take 1 tablet (150 mcg total) by mouth daily at 6 (six) AM. 10/06/22 10/06/23  Cathey Clunes, MD  linaclotide  (LINZESS ) 145 MCG CAPS capsule Take 1 capsule (145 mcg total) by mouth daily before breakfast. 04/08/23 06/30/23  Dalene Duck, MD  NON FORMULARY Take 1 capsule by mouth See admin instructions. Beef organ capsules- Take 1 capsule by mouth once a day    [provider]  OLANZapine  (ZYPREXA ) 2.5 MG tablet Take 1  tablet (2.5 mg total) by mouth daily. 05/15/23   Buena Carmine, NP  OLANZapine  (ZYPREXA ) 7.5 MG tablet Take 1 tablet (7.5 mg total) by mouth at bedtime. 05/15/23 07/14/23  Buena Carmine, NP  ondansetron  (ZOFRAN -ODT) 4 MG disintegrating tablet Take 4 mg by mouth every 6 (six) hours as needed for nausea or vomiting (dissolve orally).    [provider]  oxyCODONE  (OXY IR/ROXICODONE ) 5 MG immediate release tablet Take 1 tablet (5 mg total) by mouth every 4 (four) hours as needed for severe pain (pain score 7-10). 07/04/23   Rizwan, Saima, MD  pantoprazole  (PROTONIX ) 40 MG tablet Take 1 tablet (40 mg total) by mouth 2 (two) times daily as needed (for heartburn). 07/04/23 08/03/23  Rizwan, Saima, MD  polyethylene glycol powder (GLYCOLAX /MIRALAX ) 17 GM/SCOOP  powder Mix 17 grams in beverage and take by mouth daily as directed. 07/04/23   Rizwan, Saima, MD  promethazine  (PHENERGAN ) 25 MG suppository Place 1 suppository (25 mg total) rectally every 6 (six) hours as needed for nausea or vomiting. 04/27/23 06/30/23  Ezenduka, Nkeiruka J, MD  RASPBERRY PO Take 2 capsules by mouth See admin instructions. Raspberry Leaf capsules- Take 2 capsules by mouth once a day    [provider]  Syringe, Disposable, 20 ML MISC Use as directed for daily bladder instillations 04/06/23     traMADol  (ULTRAM ) 50 MG tablet Take 1 tablet (50 mg total) by mouth every 12 (twelve) hours as needed for moderate pain (pain score 4-6). 04/27/23   Veronica Gordon, MD    Physical Exam: Vitals:   07/06/23 0400 07/06/23 0819 07/06/23 0856 07/06/23 1000  BP: 109/72 125/72  125/82  Pulse: 75 68  62  Resp: 16 14  16   Temp: 97.6 F (36.4 C)  97.6 F (36.4 C)   TempSrc: Oral  Oral   SpO2: 95% 98%  98%   Objective: Vitals:   07/06/23 1000 07/06/23 1200  BP: 125/82 133/88  Pulse: 62 77  Resp: 16 15  Temp:    SpO2: 98% 97%    Exam:  General:  Appears calm and comfortable and is in NAD but is emotionally  labile Eyes:  EOMI, normal lids, iris ENT:  grossly normal hearing, lips & tongue, mmm Cardiovascular:  RRR, no m/r/g. No LE edema.  Respiratory:   CTA bilaterally with no wheezes/rales/rhonchi.  Normal respiratory effort. Abdomen:  soft, mildly diffusely tender, ND Skin:  no rash or induration seen on limited exam Musculoskeletal:  s/p L forearm amputation, remote; s/p R forearm/hand deformity Psychiatric:  emotionally labile mood and affect, speech fluent and appropriate, AOx3 Neurologic:  CN 2-12 grossly intact, moves all extremities in coordinated fashion  Data Reviewed: I have reviewed the patient's lab results since admission.  Pertinent labs for today include:   Glucose 127 Lactate 1.0 Normal CBC COVID/flu/RSV negative UA reassuring    Family Communication: None present Primary team communication: I have notified Dr. Reba Camper that the patient is stable for dc Thank you very much for involving us  in the care of your patient.  Author: Lorita Rosa, MD 07/06/2023 10:14 AM  For on call review [email or web address].

## 2023-07-09 ENCOUNTER — Other Ambulatory Visit (HOSPITAL_COMMUNITY): Payer: Self-pay

## 2023-07-09 ENCOUNTER — Ambulatory Visit: Payer: MEDICAID | Admitting: Gastroenterology

## 2023-07-09 ENCOUNTER — Encounter (HOSPITAL_BASED_OUTPATIENT_CLINIC_OR_DEPARTMENT_OTHER): Payer: Self-pay | Admitting: Student

## 2023-07-09 ENCOUNTER — Ambulatory Visit (HOSPITAL_BASED_OUTPATIENT_CLINIC_OR_DEPARTMENT_OTHER): Payer: MEDICAID | Admitting: Student

## 2023-07-09 ENCOUNTER — Ambulatory Visit (INDEPENDENT_AMBULATORY_CARE_PROVIDER_SITE_OTHER): Payer: MEDICAID | Admitting: Student

## 2023-07-09 VITALS — BP 147/94 | HR 83 | Temp 97.8°F | Resp 16 | Ht 71.26 in | Wt 262.5 lb

## 2023-07-09 DIAGNOSIS — Z13220 Encounter for screening for lipoid disorders: Secondary | ICD-10-CM | POA: Diagnosis not present

## 2023-07-09 DIAGNOSIS — E2740 Unspecified adrenocortical insufficiency: Secondary | ICD-10-CM | POA: Diagnosis not present

## 2023-07-09 DIAGNOSIS — Z91030 Bee allergy status: Secondary | ICD-10-CM

## 2023-07-09 DIAGNOSIS — Z6836 Body mass index (BMI) 36.0-36.9, adult: Secondary | ICD-10-CM | POA: Insufficient documentation

## 2023-07-09 DIAGNOSIS — Z136 Encounter for screening for cardiovascular disorders: Secondary | ICD-10-CM

## 2023-07-09 DIAGNOSIS — Z131 Encounter for screening for diabetes mellitus: Secondary | ICD-10-CM

## 2023-07-09 DIAGNOSIS — Z89112 Acquired absence of left hand: Secondary | ICD-10-CM

## 2023-07-09 DIAGNOSIS — F332 Major depressive disorder, recurrent severe without psychotic features: Secondary | ICD-10-CM | POA: Diagnosis not present

## 2023-07-09 DIAGNOSIS — Z7689 Persons encountering health services in other specified circumstances: Secondary | ICD-10-CM

## 2023-07-09 MED ORDER — HYDROCORTISONE 10 MG PO TABS
10.0000 mg | ORAL_TABLET | ORAL | 0 refills | Status: DC
  Filled 2023-07-09: qty 120, 30d supply, fill #0

## 2023-07-09 MED ORDER — EPINEPHRINE 0.3 MG/0.3ML IJ SOAJ
0.3000 mg | INTRAMUSCULAR | 4 refills | Status: DC | PRN
Start: 2023-07-09 — End: 2023-07-13
  Filled 2023-07-09: qty 2, 7d supply, fill #0

## 2023-07-09 NOTE — Patient Instructions (Addendum)
 It was nice to see you today!  As we discussed in clinic:  [facility] Address: [address], [place], [place] [address] Phone: [phone]  Please call or message me when you get in with endocrine.  If you have any problems before your next visit feel free to message me via MyChart (minor issues or questions) or call the office, otherwise you may reach out to schedule an office visit.  Thank you! Josue Falconi, PA-C

## 2023-07-09 NOTE — Progress Notes (Signed)
 New Patient Office Visit  Subjective    Patient ID: Krystal [name], female    DOB: [birth year]  Age: 33 y.o. MRN: [id number]  CC:  Chief Complaint  Patient presents with   Establish Care    Here to establish care.   Depression    Would like to discuss depression and lack of desire to do anything.     Discussed the use of AI scribe software for clinical note transcription with the patient, who gave verbal consent to proceed.  History of Present Illness   Krystal [name] is a 33 year old female with depression and secondary adrenal insufficiency who presents for medication management and establishment of care. Prior PCP was at [facility]. Last physical was >1 year.  She has been experiencing a recurrence of depressive symptoms despite recent medication adjustments. Initially, she responded well to a combination of Prozac  and Zyprexa , but now experiences low energy, difficulty focusing, and feeling overstimulated. She spends half the day feeling restless and lacks the desire to engage in activities. The olanzapine  was prescribed during a hospital stay, and she has not been able to follow up with a psychiatrist due to insurance issues.  She has secondary adrenal insufficiency and is currently taking Cortef , 30 mg during the day and 10 mg at night. She has about five days of medication left and is trying to get an appointment with endocrinology. She experiences symptoms of salt craving, muscle cramps, and dizziness when her blood pressure is low. Her last cortisol level check was on March 1st, 2025.  She uses an EpiPen  for allergies, which has expired. She has a history of a severe allergic reaction that required hospitalization, where her heart rate dropped to the thirties and blood pressure was low, leading to a discussion about a DNR.  Her social history includes the use of Delta-8 THC, primarily in gummy form, and occasional smoking. 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         07/09/2023    9:16 AM 10/06/2022    1:37 PM 06/19/2022    4:19 PM  PHQ9 SCORE ONLY  PHQ-9 Total Score 13 20 18    Screenings:  Colon Cancer: no fmh Lung Cancer: Uses delta-8 gummies. Breast Cancer: no fmh Cervical Cancer: UTD Diabetes: indicated HLD: indicated    Outpatient Encounter Medications as of 07/09/2023  Medication Sig   acetaminophen  (TYLENOL ) 500 MG tablet Take 1,000 mg by mouth 3 (three) times daily as needed (for pain or headaches).   albuterol  (VENTOLIN  HFA) 108 (90 Base) MCG/ACT inhaler Inhale 2 puffs into the lungs every 4 (four) hours as needed for wheezing or shortness of breath.   BENADRYL  ALLERGY 25 MG capsule Take 25 mg by mouth every 6 (six) hours as needed for allergies or itching.   bisacodyl  (DULCOLAX) 10 MG suppository Place 1 suppository (10 mg total) rectally as needed for moderate constipation.   bupivacaine ,PF, (MARCAINE ) 0.5 % SOLN injection Instill 15 mLs into bladder daily with heparin .   EPINEPHrine  (EPIPEN  2-PAK) 0.3 mg/0.3 mL IJ SOAJ injection Inject 0.3 mg into the muscle as needed for anaphylaxis.   EPINEPHrine  0.3 mg/0.3 mL IJ SOAJ injection Inject 0.3 mg into the muscle as needed for anaphylaxis.   FLUoxetine  (PROZAC ) 20 MG capsule Take 3 capsules (60 mg total) by mouth daily.   heparin  10000 UNIT/ML injection Instill 4 mLs (40,000 Units total) into the bladder daily with bupivacaine .   hydrOXYzine  (ATARAX ) 25 MG tablet  Take 50 mg by mouth at bedtime.   irbesartan -hydrochlorothiazide  (AVALIDE) 150-12.5 MG tablet Take 2 tablets by mouth in the morning and at bedtime.   levothyroxine  (SYNTHROID ) 150 MCG tablet Take 1 tablet (150 mcg total) by mouth daily at 6 (six) AM.   NON FORMULARY Take 1 capsule by mouth See admin instructions. Beef organ capsules- Take 1 capsule by mouth once a day   OLANZapine  (ZYPREXA ) 2.5 MG tablet Take 1 tablet (2.5 mg total) by mouth daily.   OLANZapine  (ZYPREXA ) 7.5 MG tablet Take 1 tablet (7.5  mg total) by mouth at bedtime.   ondansetron  (ZOFRAN -ODT) 4 MG disintegrating tablet Take 4 mg by mouth every 6 (six) hours as needed for nausea or vomiting (dissolve orally).   oxyCODONE  (OXY IR/ROXICODONE ) 5 MG immediate release tablet Take 1 tablet (5 mg total) by mouth every 4 (four) hours as needed for severe pain (pain score 7-10).   pantoprazole  (PROTONIX ) 40 MG tablet Take 1 tablet (40 mg total) by mouth 2 (two) times daily as needed (for heartburn).   polyethylene glycol powder (GLYCOLAX /MIRALAX ) 17 GM/SCOOP powder Mix 17 grams in beverage and take by mouth daily as directed.   RASPBERRY PO Take 2 capsules by mouth See admin instructions. Raspberry Leaf capsules- Take 2 capsules by mouth once a day   Syringe, Disposable, 20 ML MISC Use as directed for daily bladder instillations   traMADol  (ULTRAM ) 50 MG tablet Take 1 tablet (50 mg total) by mouth every 12 (twelve) hours as needed for moderate pain (pain score 4-6).   [DISCONTINUED] hydrocortisone  (CORTEF ) 10 MG tablet Take 10-30 mg by mouth See admin instructions. Take 30 mg by mouth in the morning and 10 mg at bedtime   doxycycline  (VIBRA -TABS) 100 MG tablet Take 100 mg by mouth 2 (two) times daily. (Patient not taking: Reported on 07/09/2023)   hydrocortisone  (CORTEF ) 10 MG tablet Take 3 tablets (30 mg) by mouth in the morning and 1 tablet (10 mg) at bedtime   linaclotide  (LINZESS ) 145 MCG CAPS capsule Take 1 capsule (145 mcg total) by mouth daily before breakfast.   promethazine  (PHENERGAN ) 25 MG suppository Place 1 suppository (25 mg total) rectally every 6 (six) hours as needed for nausea or vomiting.   [DISCONTINUED] ondansetron  (ZOFRAN ) 4 MG tablet Take 1 tablet (4 mg total) by mouth every 6 (six) hours. (Patient not taking: Reported on 07/09/2023)   No facility-administered encounter medications on file as of 07/09/2023.    Past Medical History:  Diagnosis Date   Acute acalculous cholecystitis s/p lap cholecystectomy 05/11/2020  05/11/2020   Angio-edema 01/15/2021   Anxiety    Asthma    Chronic interstitial cystitis    Depression    HELLP (hemolytic anemia/elev liver enzymes/low platelets in pregnancy) 12/08/2017   Hemorrhagic cyst of left ovary 04/15/2022   History of borderline personality disorder    History of HELLP syndrome, currently pregnant 04/21/2019   Formatting of this note might be different from the original.  History of HELLP syndrome at 26 weeks 5 days (12/07/2017); admitted to [facility] with BP 200/120s, Plt 52, AST/ALT 471/375  APS evaluation negative (04/2019)   Daily ASA 81 mg   Challenging blood pressure control in current pregnancy      Plan:  - Baseline pre-eclampsia labs this pregnancy within normal limits (Cr: 0.71; urine p:c 0.106)  -    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 03/05/2022   Hypothyroidism due to Hashimoto's thyroiditis    IC (interstitial cystitis)  Kidney stones    Migraines    Necrotizing fasciitis (HCC)    left hand amputation   PTSD (post-traumatic stress disorder)    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   Severe uncontrolled hypertension 10/08/2017   UTI (urinary tract infection)     Past Surgical History:  Procedure Laterality Date   APPENDECTOMY     BIOPSY  05/10/2020   Procedure: BIOPSY;  Surgeon: Evangeline Hilts, MD;  Location: [facility] ENDOSCOPY;  Service: Endoscopy;;   BIOPSY  05/02/2022   Procedure: BIOPSY;  Surgeon: Ace Holder, MD;  Location: [facility] ENDOSCOPY;  Service: Gastroenterology;;   CESAREAN SECTION N/A 12/06/2017   Procedure: CESAREAN SECTION;  Surgeon: Jan Mcgill, MD;  Location: [facility] BIRTHING SUITES;  Service: Obstetrics;  Laterality: N/A;   CESAREAN SECTION  07/21/2021   Procedure: CESAREAN SECTION;  Surgeon: Granville Layer, MD;  Location: [facility] LD ORS;  Service: Obstetrics;;   CHOLECYSTECTOMY N/A 05/11/2020   Procedure: SINGLE SITE LAPAROSCOPIC CHOLECYSTECTOMY AND LIVER BIOSY;  Surgeon: Candyce Champagne, MD;  Location: [facility] ORS;  Service:  General;  Laterality: N/A;   ESOPHAGOGASTRODUODENOSCOPY (EGD) WITH PROPOFOL  N/A 05/10/2020   Procedure: ESOPHAGOGASTRODUODENOSCOPY (EGD) WITH PROPOFOL ;  Surgeon: Evangeline Hilts, MD;  Location: [facility] ENDOSCOPY;  Service: Endoscopy;  Laterality: N/A;   FLEXIBLE SIGMOIDOSCOPY N/A 05/02/2022   Procedure: FLEXIBLE SIGMOIDOSCOPY;  Surgeon: Ace Holder, MD;  Location: [facility] ENDOSCOPY;  Service: Gastroenterology;  Laterality: N/A;   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   HAND RECONSTRUCTION Right    INCISION AND DRAINAGE     LAPAROSCOPIC LYSIS OF ADHESIONS  04/22/2022   Procedure: LAPAROSCOPIC LYSIS OF ADHESIONS;  Surgeon: Raynell Caller, MD;  Location: [facility] OR;  Service: Gynecology;;   LAPAROSCOPIC OVARIAN CYSTECTOMY Left 04/22/2022   Procedure: LAPAROSCOPIC LEFT OVARIAN CYSTECTOMY;  Surgeon: Raynell Caller, MD;  Location: [facility] OR;  Service: Gynecology;  Laterality: Left;   NASAL SEPTUM SURGERY      Family History  Problem Relation Age of Onset   Hypertension Mother    Coronary artery disease Mother    Migraines Mother    Hypertension Father    Stroke Father    Atrial fibrillation Father    Thyroid  disease Sister    Clotting disorder Sister    Asthma Sister    Migraines Sister    Thyroid  disease Sister    Asthma Sister    Migraines Sister    Asthma Brother        oldest   Migraines Brother    Hypertension Brother    Gout Brother    Asthma Brother    Migraines Brother    Asthma Brother    Migraines Brother    Heart failure Maternal Grandfather    Heart attack Maternal Grandfather     Social History   Socioeconomic History   Marital status: Married    Spouse name: Not on file   Number of children: 3   Years of education: Not on file   Highest education level: 8th grade  Occupational History   Not on file  Tobacco Use   Smoking status: Never    Passive exposure: Never   Smokeless tobacco: Never   Tobacco comments:    She tried a cigarette and never  smoked again  Vaping Use   Vaping status: Every Day   Devices: DELTA- 8  Substance and Sexual Activity   Alcohol use: Yes    Comment: bottle of wine a week-occ   Drug use: Yes  Comment: DELTA-8   Sexual activity: Not Currently    Birth control/protection: Surgical  Other Topics Concern   Not on file  Social History Narrative   Lives with husband and three kids   Social Drivers of Corporate investment banker Strain: Not on file  Food Insecurity: No Food Insecurity (06/30/2023)   Hunger Vital Sign    Worried About Running Out of Food in the Last Year: Never true    Ran Out of Food in the Last Year: Never true  Recent Concern: Food Insecurity - Food Insecurity Present (04/22/2023)   Hunger Vital Sign    Worried About Running Out of Food in the Last Year: Often true    Ran Out of Food in the Last Year: Often true  Transportation Needs: No Transportation Needs (06/30/2023)   PRAPARE - Administrator, Civil Service (Medical): No    Lack of Transportation (Non-Medical): No  Recent Concern: Transportation Needs - Unmet Transportation Needs (04/22/2023)   PRAPARE - Transportation    Lack of Transportation (Medical): Yes    Lack of Transportation (Non-Medical): Patient declined  Physical Activity: Inactive (04/09/2022)   Exercise Vital Sign    Days of Exercise per Week: 0 days    Minutes of Exercise per Session: 0 min  Stress: Not on file  Social Connections: Unknown (06/30/2023)   Social Connection and Isolation Panel [NHANES]    Frequency of Communication with Friends and Family: More than three times a week    Frequency of Social Gatherings with Friends and Family: Three times a week    Attends Religious Services: Patient declined    Active Member of Clubs or Organizations: Patient declined    Attends Banker Meetings: Patient declined    Marital Status: Married  Catering manager Violence: Not At Risk (06/30/2023)   Humiliation, Afraid, Rape, and Kick  questionnaire    Fear of Current or Ex-Partner: No    Emotionally Abused: No    Physically Abused: No    Sexually Abused: No    ROS  Per HPI      Objective    BP (!) 147/94   Pulse 83   Temp 97.8 F (36.6 C) (Oral)   Resp 16   Ht 5' 11.26" (1.81 m)   Wt 262 lb 8 oz (119.1 kg)   LMP 06/22/2023 (Approximate) Comment: pregnancy waiver formed signed 06-30-2023  SpO2 98%   Breastfeeding No   BMI 36.34 kg/m   Physical Exam Constitutional:      General: She is not in acute distress.    Appearance: Normal appearance. She is not ill-appearing.  HENT:     Head: Normocephalic and atraumatic.     Right Ear: External ear normal.     Left Ear: External ear normal.     Nose: Nose normal.     Mouth/Throat:     Mouth: Mucous membranes are moist.     Pharynx: Oropharynx is clear.  Eyes:     General: No scleral icterus.    Extraocular Movements: Extraocular movements intact.     Conjunctiva/sclera: Conjunctivae normal.     Pupils: Pupils are equal, round, and reactive to light.  Neck:     Vascular: No carotid bruit.  Cardiovascular:     Rate and Rhythm: Normal rate and regular rhythm.     Pulses: Normal pulses.     Heart sounds: Normal heart sounds. No murmur heard.    No friction rub.  Pulmonary:  Effort: Pulmonary effort is normal. No respiratory distress.     Breath sounds: Normal breath sounds. No wheezing, rhonchi or rales.  Musculoskeletal:        General: Normal range of motion.     Cervical back: Neck supple.     Right lower leg: No edema.     Left lower leg: No edema.  Skin:    General: Skin is warm and dry.     Coloration: Skin is not jaundiced or pale.     Comments: Extensive scarring on the right dorsal forearm, right dorsal hand. 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  Neurological:     General: No focal deficit present.     Mental Status: She is alert.     Deep Tendon Reflexes: Reflexes normal.  Psychiatric:         Mood and Affect: Mood normal.        Behavior: Behavior normal.         Assessment & Plan:   Encounter to establish care  BMI 36.0-36.9,adult  Bee allergy status Assessment & Plan: Anaphylaxis risk with expired EpiPen , especially with potential exposure to bees in the garden. Requires renewal of prescription for safety. - Prescribe a two-pack of EpiPen .  Orders: -     EPINEPHrine ; Inject 0.3 mg into the muscle as needed for anaphylaxis.  Dispense: 2 each; Refill: 4  Encounter for lipid screening for cardiovascular disease -     Lipid panel  Screening for diabetes mellitus -     Hemoglobin A1c  Adrenal insufficiency (HCC) Assessment & Plan: Secondary adrenal insufficiency with recent medication adjustment. Currently on 30 mg Cortef  during the day and 10 mg at night. Symptoms include fatigue, salt craving, and muscle cramps. Previous crisis with severe bradycardia and hypotension underscores the need for continuous medication. Requires endocrinology follow-up for ongoing management and medication adjustment. - Refill Cortef  to bridge until endocrinology appointment. - Order lab tests including potassium and PRA level. - Advise to contact endocrinology to schedule an appointment within the next month. - Instruct to call or message when endocrinology appointment is scheduled.  Orders: -     CBC with Differential/Platelet -     Comprehensive metabolic panel with GFR -     Hydrocortisone ; Take 3 tablets (30 mg) by mouth in the morning and 1 tablet (10 mg) at bedtime  Dispense: 120 tablet; Refill: 0 -     Aldosterone + renin activity w/ ratio  Severe episode of recurrent major depressive disorder, without psychotic features [facility]) Assessment & Plan: Depression with recent medication changes. Prozac  was changed and Zyprexa  (olanzapine ) was added in the last several months. Initial improvement was followed by a decline in symptoms, including low energy, lack of focus, and feeling  overstimulated, possibly due to medication adaptation or side effects. Requires psychiatric follow-up for medication management. - Refer to [facility] for psychiatric evaluation and management. - Provide contact information for [facility].   Return in about 8 weeks (around 09/03/2023).   Chandler Stofer T Alejandra Barna, PA-C

## 2023-07-09 NOTE — Assessment & Plan Note (Signed)
 Secondary adrenal insufficiency with recent medication adjustment. Currently on 30 mg Cortef  during the day and 10 mg at night. Symptoms include fatigue, salt craving, and muscle cramps. Previous crisis with severe bradycardia and hypotension underscores the need for continuous medication. Requires endocrinology follow-up for ongoing management and medication adjustment. - Refill Cortef  to bridge until endocrinology appointment. - Order lab tests including potassium and PRA level. - Advise to contact endocrinology to schedule an appointment within the next month. - Instruct to call or message when endocrinology appointment is scheduled.

## 2023-07-09 NOTE — Assessment & Plan Note (Signed)
 Anaphylaxis risk with expired EpiPen , especially with potential exposure to bees in the garden. Requires renewal of prescription for safety. - Prescribe a two-pack of EpiPen .

## 2023-07-09 NOTE — Assessment & Plan Note (Signed)
 Depression with recent medication changes. Prozac  was changed and Zyprexa  (olanzapine ) was added in the last several months. Initial improvement was followed by a decline in symptoms, including low energy, lack of focus, and feeling overstimulated, possibly due to medication adaptation or side effects. Requires psychiatric follow-up for medication management. - Refer to [facility] for psychiatric evaluation and management. - Provide contact information for [facility].

## 2023-07-13 ENCOUNTER — Other Ambulatory Visit (HOSPITAL_BASED_OUTPATIENT_CLINIC_OR_DEPARTMENT_OTHER): Payer: Self-pay

## 2023-07-13 ENCOUNTER — Encounter (HOSPITAL_BASED_OUTPATIENT_CLINIC_OR_DEPARTMENT_OTHER): Payer: Self-pay | Admitting: Student

## 2023-07-13 DIAGNOSIS — E2740 Unspecified adrenocortical insufficiency: Secondary | ICD-10-CM

## 2023-07-13 DIAGNOSIS — Z91030 Bee allergy status: Secondary | ICD-10-CM

## 2023-07-13 LAB — ALDOSTERONE + RENIN ACTIVITY W/ RATIO
Aldos/Renin Ratio: 0.8 (ref 0.0–30.0)
Aldosterone: 2.7 ng/dL (ref 0.0–30.0)
Renin Activity, Plasma: 3.585 ng/mL/h (ref 0.167–5.380)

## 2023-07-13 LAB — COMPREHENSIVE METABOLIC PANEL WITH GFR
ALT: 23 IU/L (ref 0–32)
AST: 16 IU/L (ref 0–40)
Albumin: 4 g/dL (ref 3.9–4.9)
Alkaline Phosphatase: 76 IU/L (ref 44–121)
BUN/Creatinine Ratio: 26 — ABNORMAL HIGH (ref 9–23)
BUN: 23 mg/dL — ABNORMAL HIGH (ref 6–20)
Bilirubin Total: <0.2 mg/dL (ref 0.0–1.2)
CO2: 25 mmol/L (ref 20–29)
Calcium: 9.4 mg/dL (ref 8.7–10.2)
Chloride: 102 mmol/L (ref 96–106)
Creatinine, Ser: 0.88 mg/dL (ref 0.57–1.00)
Globulin, Total: 2.4 g/dL (ref 1.5–4.5)
Glucose: 76 mg/dL (ref 70–99)
Potassium: 4.2 mmol/L (ref 3.5–5.2)
Sodium: 143 mmol/L (ref 134–144)
Total Protein: 6.4 g/dL (ref 6.0–8.5)
eGFR: 88 mL/min/{1.73_m2} (ref >59)

## 2023-07-13 LAB — HEMOGLOBIN A1C
Est. average glucose Bld gHb Est-mCnc: 103 mg/dL
Hgb A1c MFr Bld: 5.2 % (ref 4.8–5.6)

## 2023-07-13 LAB — CBC WITH DIFFERENTIAL/PLATELET
Basophils Absolute: 0.1 10*3/uL (ref 0.0–0.2)
Basos: 1 %
EOS (ABSOLUTE): 0.2 10*3/uL (ref 0.0–0.4)
Eos: 3 %
Hematocrit: 41.3 % (ref 34.0–46.6)
Hemoglobin: 12.8 g/dL (ref 11.1–15.9)
Immature Grans (Abs): 0.1 10*3/uL (ref 0.0–0.1)
Immature Granulocytes: 1 %
Lymphocytes Absolute: 1.9 10*3/uL (ref 0.7–3.1)
Lymphs: 25 %
MCH: 30.7 pg (ref 26.6–33.0)
MCHC: 31 g/dL — ABNORMAL LOW (ref 31.5–35.7)
MCV: 99 fL — ABNORMAL HIGH (ref 79–97)
Monocytes Absolute: 0.9 10*3/uL (ref 0.1–0.9)
Monocytes: 12 %
Neutrophils Absolute: 4.7 10*3/uL (ref 1.4–7.0)
Neutrophils: 58 %
Platelets: 329 10*3/uL (ref 150–450)
RBC: 4.17 x10E6/uL (ref 3.77–5.28)
RDW: 13.2 % (ref 11.7–15.4)
WBC: 7.8 10*3/uL (ref 3.4–10.8)

## 2023-07-13 LAB — LIPID PANEL
Chol/HDL Ratio: 4.9 ratio — ABNORMAL HIGH (ref 0.0–4.4)
Cholesterol, Total: 185 mg/dL (ref 100–199)
HDL: 38 mg/dL — ABNORMAL LOW (ref >39)
LDL Chol Calc (NIH): 107 mg/dL — ABNORMAL HIGH (ref 0–99)
Triglycerides: 233 mg/dL — ABNORMAL HIGH (ref 0–149)
VLDL Cholesterol Cal: 40 mg/dL (ref 5–40)

## 2023-07-13 MED ORDER — HYDROCORTISONE 10 MG PO TABS: 10.0000 mg | ORAL_TABLET | ORAL | 0 refills | Status: DC

## 2023-07-13 MED ORDER — EPINEPHRINE 0.3 MG/0.3ML IJ SOAJ: 0.3000 mg | INTRAMUSCULAR | 4 refills | Status: DC | PRN

## 2023-07-14 ENCOUNTER — Other Ambulatory Visit (HOSPITAL_COMMUNITY): Payer: Self-pay

## 2023-07-21 ENCOUNTER — Ambulatory Visit (HOSPITAL_BASED_OUTPATIENT_CLINIC_OR_DEPARTMENT_OTHER): Payer: Self-pay | Admitting: Student

## 2023-07-21 ENCOUNTER — Other Ambulatory Visit (HOSPITAL_BASED_OUTPATIENT_CLINIC_OR_DEPARTMENT_OTHER): Payer: Self-pay | Admitting: Student

## 2023-07-21 ENCOUNTER — Encounter (HOSPITAL_BASED_OUTPATIENT_CLINIC_OR_DEPARTMENT_OTHER): Payer: Self-pay | Admitting: Student

## 2023-07-21 ENCOUNTER — Other Ambulatory Visit (HOSPITAL_COMMUNITY): Payer: Self-pay

## 2023-07-21 DIAGNOSIS — F4310 Post-traumatic stress disorder, unspecified: Secondary | ICD-10-CM

## 2023-07-21 DIAGNOSIS — F411 Generalized anxiety disorder: Secondary | ICD-10-CM

## 2023-07-21 DIAGNOSIS — F603 Borderline personality disorder: Secondary | ICD-10-CM

## 2023-07-21 MED ORDER — FLUOXETINE HCL 20 MG PO CAPS: 60.0000 mg | ORAL_CAPSULE | Freq: Every day | ORAL | 0 refills | Status: DC

## 2023-07-21 NOTE — Progress Notes (Unsigned)
 Telephone call: discussed lab results with patient. She states that she is swelling in her legs and arms and BP has been remaining n the 140s over 80s. Reducing cortisol dose in increments of 5 mg daily (because she has 10mg  tablets) until resolution of symptoms. Discussed that she should not experience lethargy, weakness, low blood pressure, nausea, weight loss, or poor appetite.  Taking 30mg  cortef  in the morning and 10mg  at night.  Discussed with dr. Kathyanne Parkers at [facility] endocrinology.

## 2023-07-22 ENCOUNTER — Other Ambulatory Visit (HOSPITAL_BASED_OUTPATIENT_CLINIC_OR_DEPARTMENT_OTHER): Payer: Self-pay | Admitting: Student

## 2023-07-22 MED ORDER — FLUOXETINE HCL 20 MG PO CAPS: 60.0000 mg | ORAL_CAPSULE | Freq: Every day | ORAL | 0 refills | Status: AC

## 2023-08-03 NOTE — Progress Notes (Deleted)
 08/03/2023 Krystal [name] [id number] 01-05-1991  Referring provider: Cherylene Corrente, PA-C Primary GI doctor: {acdocs:[id number]}  ASSESSMENT AND PLAN:  Abdominal pain Flex sig in March 2024 showed poor prep and normal mucosa. Biopsies were negative for any acute changes.  CT abdomen pelvis with IV contrast on admission on March 13, 2023 showed no acute changes.   Nausea and vomiting Status post cholecystectomy March 2022 EGD in March 2022 for evaluation of epigastric pain and nausea which showed gastritis. Biopsies were negative for H. pylori.   adrenal sufficiency  PTSD/anxiety depression   History of substance abuse   Patient Care Team: Rothfuss, Jacob T, PA-C as PCP - General (Physician Assistant)  HISTORY OF PRESENT ILLNESS: 33 y.o. female with a past medical history listed below presents for evaluation of nausea and vomiting.    *** Discussed the use of AI scribe software for clinical note transcription with the patient, who gave verbal consent to proceed.  History of Present Illness            She  reports that she has never smoked. She has never been exposed to tobacco smoke. She has never used smokeless tobacco. She reports current alcohol use. She reports current drug use.  RELEVANT GI HISTORY, IMAGING AND LABS: Results          CBC    Component Value Date/Time   WBC 7.8 07/09/2023 1010   WBC 5.5 07/06/2023 0623   RBC 4.17 07/09/2023 1010   RBC 3.97 07/06/2023 0623   HGB 12.8 07/09/2023 1010   HCT 41.3 07/09/2023 1010   PLT 329 07/09/2023 1010   MCV 99 (H) 07/09/2023 1010   MCH 30.7 07/09/2023 1010   MCH 32.0 07/06/2023 0623   MCHC 31.0 (L) 07/09/2023 1010   MCHC 33.4 07/06/2023 0623   RDW 13.2 07/09/2023 1010   LYMPHSABS 1.9 07/09/2023 1010   MONOABS 0.7 06/29/2023 1500   EOSABS 0.2 07/09/2023 1010   BASOSABS 0.1 07/09/2023 1010   Recent Labs    04/21/23 1135 04/22/23 0601 04/25/23 1045 05/17/23 1730 05/28/23 1226  06/29/23 1500 06/30/23 1203 07/01/23 0402 07/06/23 0623 07/09/23 1010  HGB 15.0 14.1 13.3 12.9 14.6 13.3 14.3 13.9 12.7 12.8    CMP     Component Value Date/Time   NA 143 07/09/2023 1010   K 4.2 07/09/2023 1010   CL 102 07/09/2023 1010   CO2 25 07/09/2023 1010   GLUCOSE 76 07/09/2023 1010   GLUCOSE 127 (H) 07/06/2023 0623   BUN 23 (H) 07/09/2023 1010   CREATININE 0.88 07/09/2023 1010   CALCIUM  9.4 07/09/2023 1010   PROT 6.4 07/09/2023 1010   ALBUMIN 4.0 07/09/2023 1010   AST 16 07/09/2023 1010   ALT 23 07/09/2023 1010   ALKPHOS 76 07/09/2023 1010   BILITOT <0.2 07/09/2023 1010   GFRNONAA >60 07/06/2023 0623   GFRAA >60 12/18/2017 1844      Latest Ref Rng & Units 07/09/2023   10:10 AM 07/06/2023    6:23 AM 06/30/2023   12:03 PM  Hepatic Function  Total Protein 6.0 - 8.5 g/dL 6.4  6.6  7.6   Albumin 3.9 - 4.9 g/dL 4.0  3.5  4.2   AST 0 - 40 IU/L 16  29  27    ALT 0 - 32 IU/L 23  22  28    Alk Phosphatase 44 - 121 IU/L 76  65  62   Total Bilirubin 0.0 - 1.2 mg/dL <8.6  0.6  0.6  Current Medications:   Current Outpatient Medications (Endocrine & Metabolic):    hydrocortisone  (CORTEF ) 10 MG tablet, Take 3 tablets (30 mg) by mouth in the morning and 1 tablet (10 mg) at bedtime   levothyroxine  (SYNTHROID ) 150 MCG tablet, Take 1 tablet (150 mcg total) by mouth daily at 6 (six) AM.  Current Outpatient Medications (Cardiovascular):    EPINEPHrine  (EPIPEN  2-PAK) 0.3 mg/0.3 mL IJ SOAJ injection, Inject 0.3 mg into the muscle as needed for anaphylaxis.   EPINEPHrine  0.3 mg/0.3 mL IJ SOAJ injection, Inject 0.3 mg into the muscle as needed for anaphylaxis.   irbesartan -hydrochlorothiazide  (AVALIDE) 150-12.5 MG tablet, Take 2 tablets by mouth in the morning and at bedtime.  Current Outpatient Medications (Respiratory):    albuterol  (VENTOLIN  HFA) 108 (90 Base) MCG/ACT inhaler, Inhale 2 puffs into the lungs every 4 (four) hours as needed for wheezing or shortness of breath.    BENADRYL  ALLERGY 25 MG capsule, Take 25 mg by mouth every 6 (six) hours as needed for allergies or itching.   promethazine  (PHENERGAN ) 25 MG suppository, Place 1 suppository (25 mg total) rectally every 6 (six) hours as needed for nausea or vomiting.  Current Outpatient Medications (Analgesics):    acetaminophen  (TYLENOL ) 500 MG tablet, Take 1,000 mg by mouth 3 (three) times daily as needed (for pain or headaches).   bupivacaine ,PF, (MARCAINE ) 0.5 % SOLN injection, Instill 15 mLs into bladder daily with heparin .   oxyCODONE  (OXY IR/ROXICODONE ) 5 MG immediate release tablet, Take 1 tablet (5 mg total) by mouth every 4 (four) hours as needed for severe pain (pain score 7-10).   traMADol  (ULTRAM ) 50 MG tablet, Take 1 tablet (50 mg total) by mouth every 12 (twelve) hours as needed for moderate pain (pain score 4-6).  Current Outpatient Medications (Hematological):    heparin  10000 UNIT/ML injection, Instill 4 mLs (40,000 Units total) into the bladder daily with bupivacaine .  Current Outpatient Medications (Other):    bisacodyl  (DULCOLAX) 10 MG suppository, Place 1 suppository (10 mg total) rectally as needed for moderate constipation.   FLUoxetine  (PROZAC ) 20 MG capsule, Take 3 capsules (60 mg total) by mouth daily.   hydrOXYzine  (ATARAX ) 25 MG tablet, Take 50 mg by mouth at bedtime.   linaclotide  (LINZESS ) 145 MCG CAPS capsule, Take 1 capsule (145 mcg total) by mouth daily before breakfast.   NON FORMULARY, Take 1 capsule by mouth See admin instructions. Beef organ capsules- Take 1 capsule by mouth once a day   OLANZapine  (ZYPREXA ) 2.5 MG tablet, Take 1 tablet (2.5 mg total) by mouth daily.   OLANZapine  (ZYPREXA ) 7.5 MG tablet, Take 1 tablet (7.5 mg total) by mouth at bedtime.   ondansetron  (ZOFRAN -ODT) 4 MG disintegrating tablet, Take 4 mg by mouth every 6 (six) hours as needed for nausea or vomiting (dissolve orally).   pantoprazole  (PROTONIX ) 40 MG tablet, Take 1 tablet (40 mg total) by mouth 2  (two) times daily as needed (for heartburn).   polyethylene glycol powder (GLYCOLAX /MIRALAX ) 17 GM/SCOOP powder, Mix 17 grams in beverage and take by mouth daily as directed.   RASPBERRY PO, Take 2 capsules by mouth See admin instructions. Raspberry Leaf capsules- Take 2 capsules by mouth once a day   Syringe, Disposable, 20 ML MISC, Use as directed for daily bladder instillations  Medical History:  Past Medical History:  Diagnosis Date   Acute acalculous cholecystitis s/p lap cholecystectomy 05/11/2020 05/11/2020   Angio-edema 01/15/2021   Anxiety    Asthma    Chronic interstitial cystitis  Depression    HELLP (hemolytic anemia/elev liver enzymes/low platelets in pregnancy) 12/08/2017   Hemorrhagic cyst of left ovary 04/15/2022   History of borderline personality disorder    History of HELLP syndrome, currently pregnant 04/21/2019   Formatting of this note might be different from the original.  History of HELLP syndrome at 26 weeks 5 days (12/07/2017); admitted to [facility] with BP 200/120s, Plt 52, AST/ALT 471/375  APS evaluation negative (04/2019)   Daily ASA 81 mg   Challenging blood pressure control in current pregnancy      Plan:  - Baseline pre-eclampsia labs this pregnancy within normal limits (Cr: 0.71; urine p:c 0.106)  -    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 03/05/2022   Hypothyroidism due to Hashimoto's thyroiditis    IC (interstitial cystitis)    Kidney stones    Migraines    Necrotizing fasciitis (HCC)    left hand amputation   PTSD (post-traumatic stress disorder)    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 Airway restriction.   Coreg  [Carvedilol ] Shortness Of Breath and Other (See Comments)    Triggers asthma   Red Dye #40 (Allura Red) Other (See  Comments)    Migraines   Compazine  [Prochlorperazine  Maleate] Other (See Comments)    Twitching Able to tolerate promethazine    Oxycodone  Nausea And Vomiting   Vicodin [Hydrocodone -Acetaminophen ] Itching, Nausea And Vomiting and Other (See Comments)    OK with Tylenol    Cymbalta [Duloxetine Hcl] Other (See Comments)    Irritability  Syncope    Ditropan  [Oxybutynin ] Other (See Comments)    Severe dry mouth   Elavil [Amitriptyline] Other (See Comments)    Irritability    Motrin  [Ibuprofen ] Other (See Comments)    Heartburn    Naprosyn  [Naproxen ] Other (See Comments)    Heartburn    Pyrimethamine Other (See Comments)    From Fansidar combination drug Triggers asthma   Sulfadoxine Other (See Comments)    From Fansidar combination drug Triggers asthma    Zestril  [Lisinopril ] Cough   Nifedical Xl [Nifedipine ] Nausea And Vomiting   Reglan  [Metoclopramide ] Swelling and Other (See Comments)    Restlessness Able to take with Benadryl    Trandate  [Labetalol ] Nausea And Vomiting and Other (See Comments)    Triggers asthma      Surgical History:  She  has a past surgical history that includes hand amputation; Appendectomy; Nasal septum surgery; Hand reconstruction (Right); Incision and drainage; Cesarean section (N/A, 12/06/2017); Esophagogastroduodenoscopy (egd) with propofol  (N/A, 05/10/2020); biopsy (05/10/2020); Cholecystectomy (N/A, 05/11/2020); Cesarean section (07/21/2021); Laparoscopic ovarian cystectomy (Left, 04/22/2022); Laparoscopic lysis of adhesions (04/22/2022); Flexible sigmoidoscopy (N/A, 05/02/2022); and biopsy (05/02/2022). Family History:  Her family history includes Asthma in her brother, brother, brother, sister, and sister; Atrial fibrillation in her father; Clotting disorder in her sister; Coronary artery disease in her mother; Gout in her brother; Heart attack in her maternal grandfather; Heart failure in her maternal grandfather; Hypertension in her brother, father, and  mother; Migraines in her brother, brother, brother, mother, sister, and sister; Stroke in her father; Thyroid  disease in her sister and sister.  REVIEW OF SYSTEMS  : All other systems reviewed and negative except where noted in the History of Present Illness.  PHYSICAL EXAM: LMP 06/22/2023 (Approximate) Comment: pregnancy waiver formed signed 06-30-2023 Physical Exam  Edmonia Gottron, PA-C 10:35 AM

## 2023-08-04 ENCOUNTER — Ambulatory Visit: Payer: MEDICAID | Admitting: Physician Assistant

## 2023-08-04 ENCOUNTER — Ambulatory Visit (HOSPITAL_COMMUNITY): Admitting: Psychiatry

## 2023-08-07 NOTE — Telephone Encounter (Signed)
 Andi from Dr[facility] called and stated patient was schedule in 4/24 to see Dr. Kathyanne Parkers but she canceled and is now scheduled to see the provider on 09/09/23, but they will call patient to see if she able to come in on 08/21/23 for a emergency consult but provider can not give recommendation on a patient he has not seen. He apologies for not being able to help any further.

## 2023-08-15 IMAGING — US US ABDOMEN LIMITED
1 series · 15 of 25 positions shown · non-contrast
Comparison: 07/06/2021 ultrasound

CLINICAL DATA: 30-year-old female with abdominal pain and nausea.
History of cholecystectomy.

EXAM:
ULTRASOUND ABDOMEN LIMITED RIGHT UPPER QUADRANT

[Series 1: us abdomen limited · 15 of 34 slices shown]
[im 1/34]
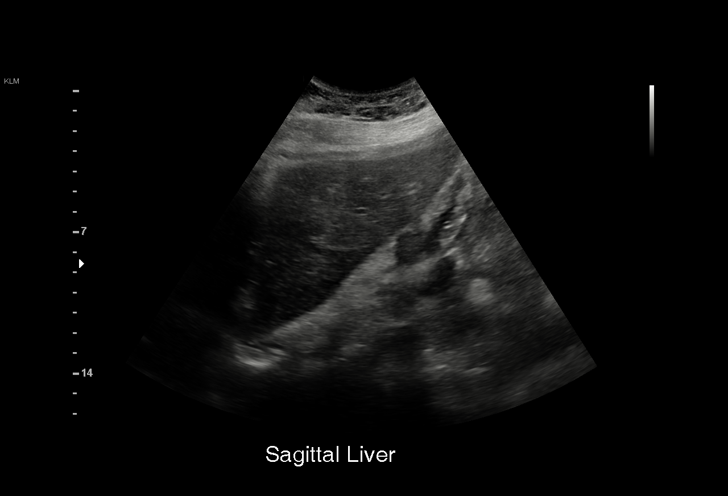
[im 3/34]
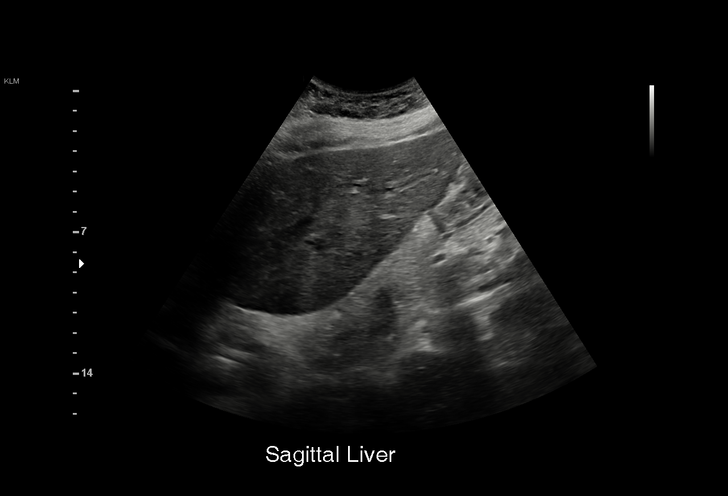
[im 6/34]
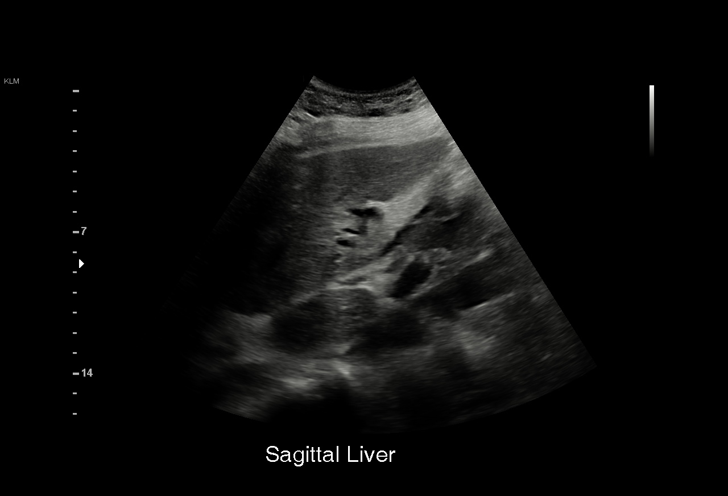
[im 7/34]
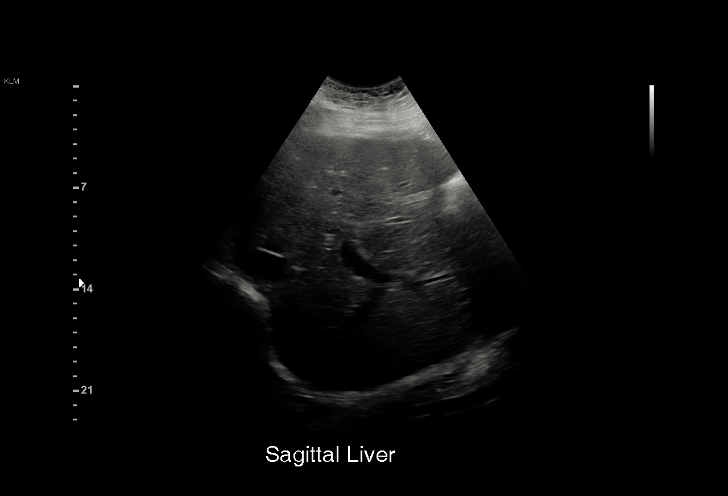
[im 10/34]
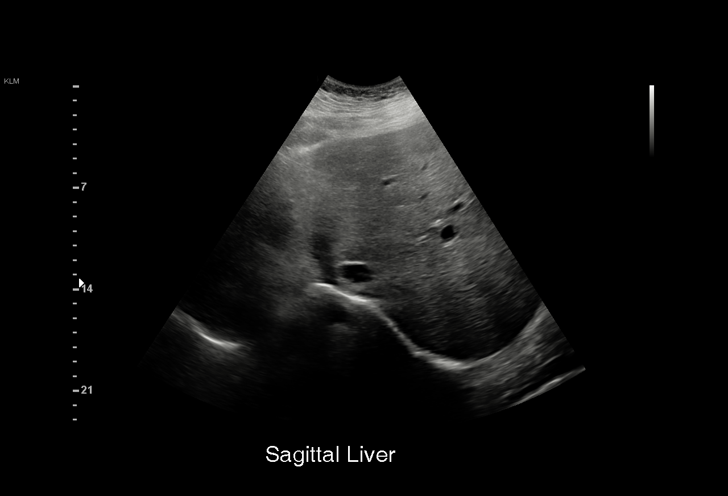
[im 13/34]
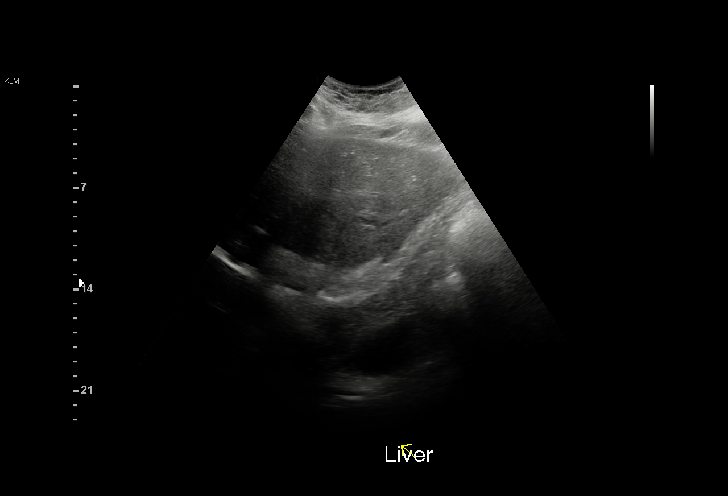
[im 14/34]
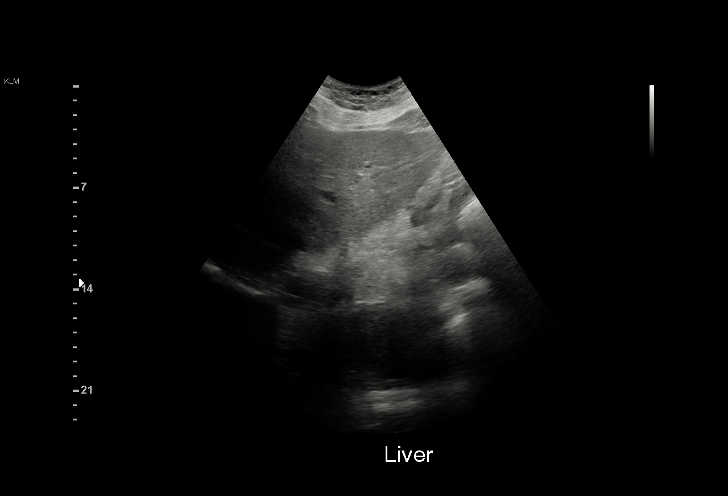
[im 17/34]
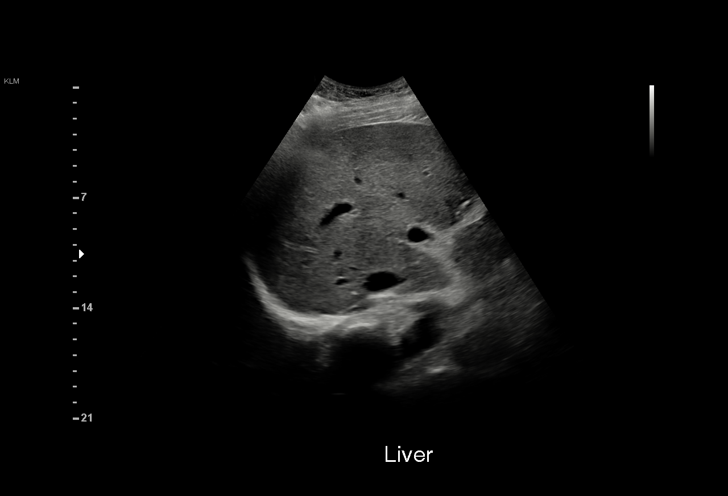
[im 20/34]
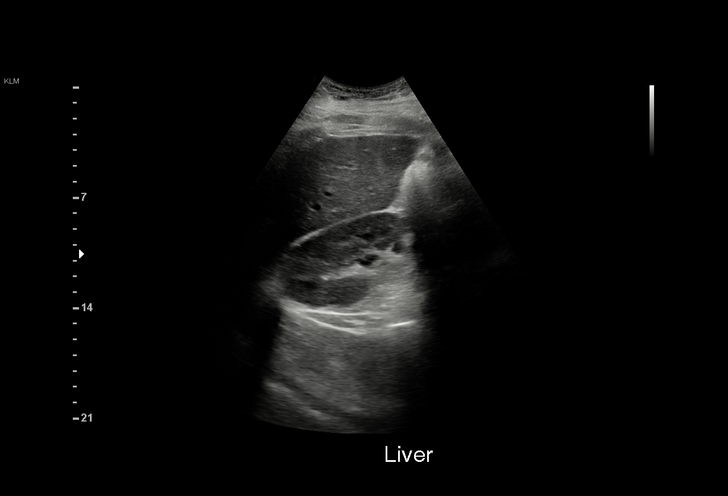
[im 21/34]
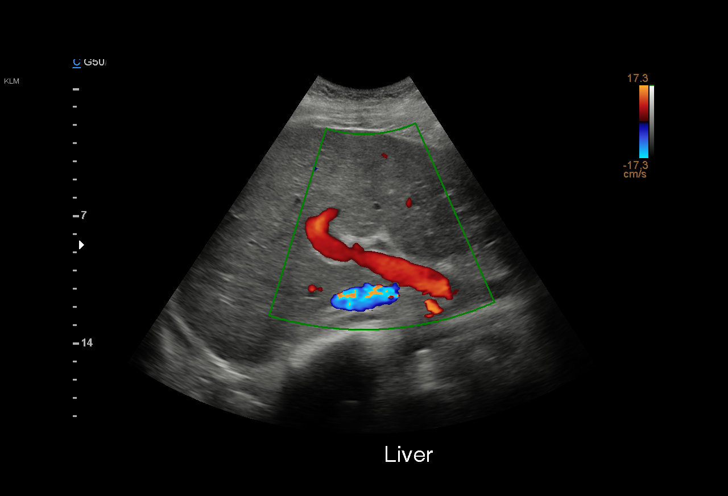
[im 24/34]
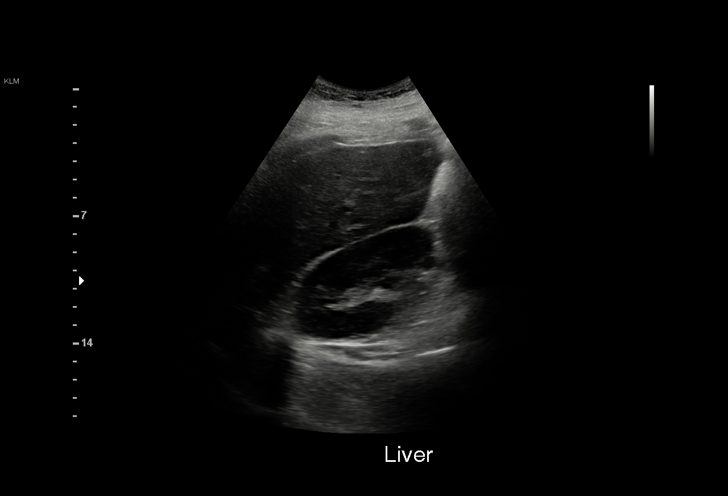
[im 27/34]
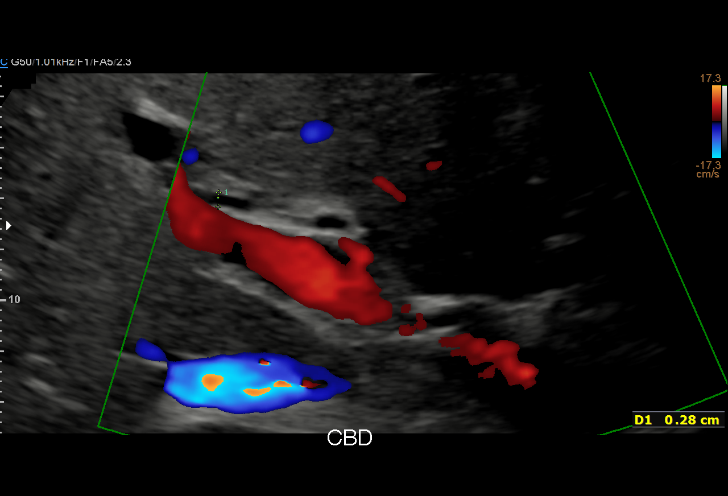
[im 28/34]
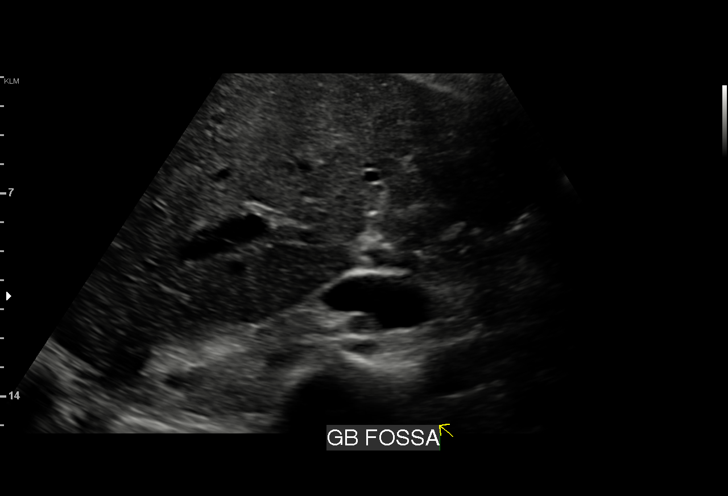
[im 31/34]
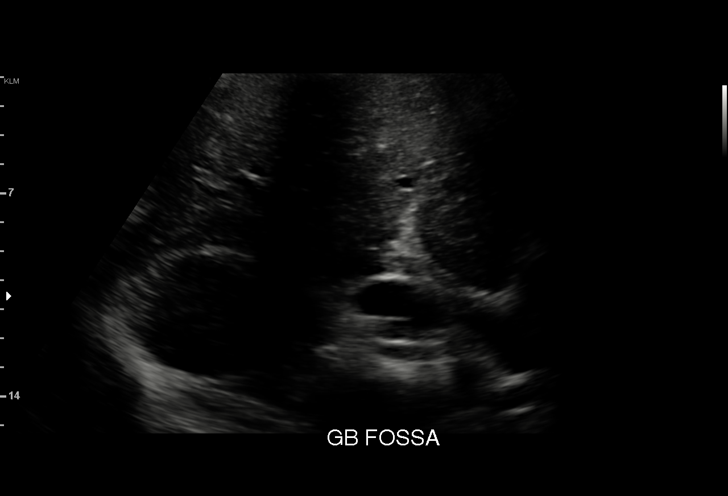
[im 34/34]
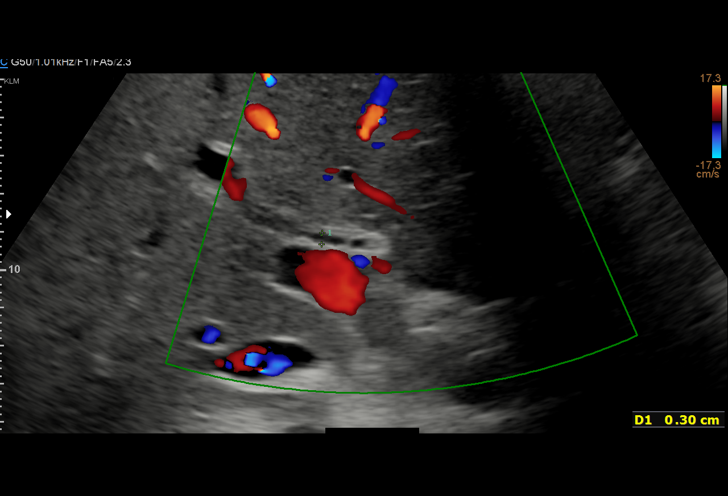

[15 of 25 positions shown; findings below may reference images not displayed]

FINDINGS: Gallbladder:

Not visualized compatible with cholecystectomy.

Common bile duct:

Diameter: 4 mm. There is no evidence of intrahepatic or extrahepatic
biliary dilatation.

Liver:

No focal lesion identified. Within normal limits in parenchymal
echogenicity. Portal vein is patent on color Doppler imaging with
normal direction of blood flow towards the liver.

Other: None.
IMPRESSION: 1. No acute abnormality.
2. Unremarkable liver.  No biliary dilatation.
3. Status post cholecystectomy.

## 2023-08-15 IMAGING — DX DG CHEST 1V PORT
1 series · 1 of 1 positions shown · non-contrast
Comparison: 09/19/2020.

CLINICAL DATA: Short of breath.

EXAM:
PORTABLE CHEST 1 VIEW

[chest]
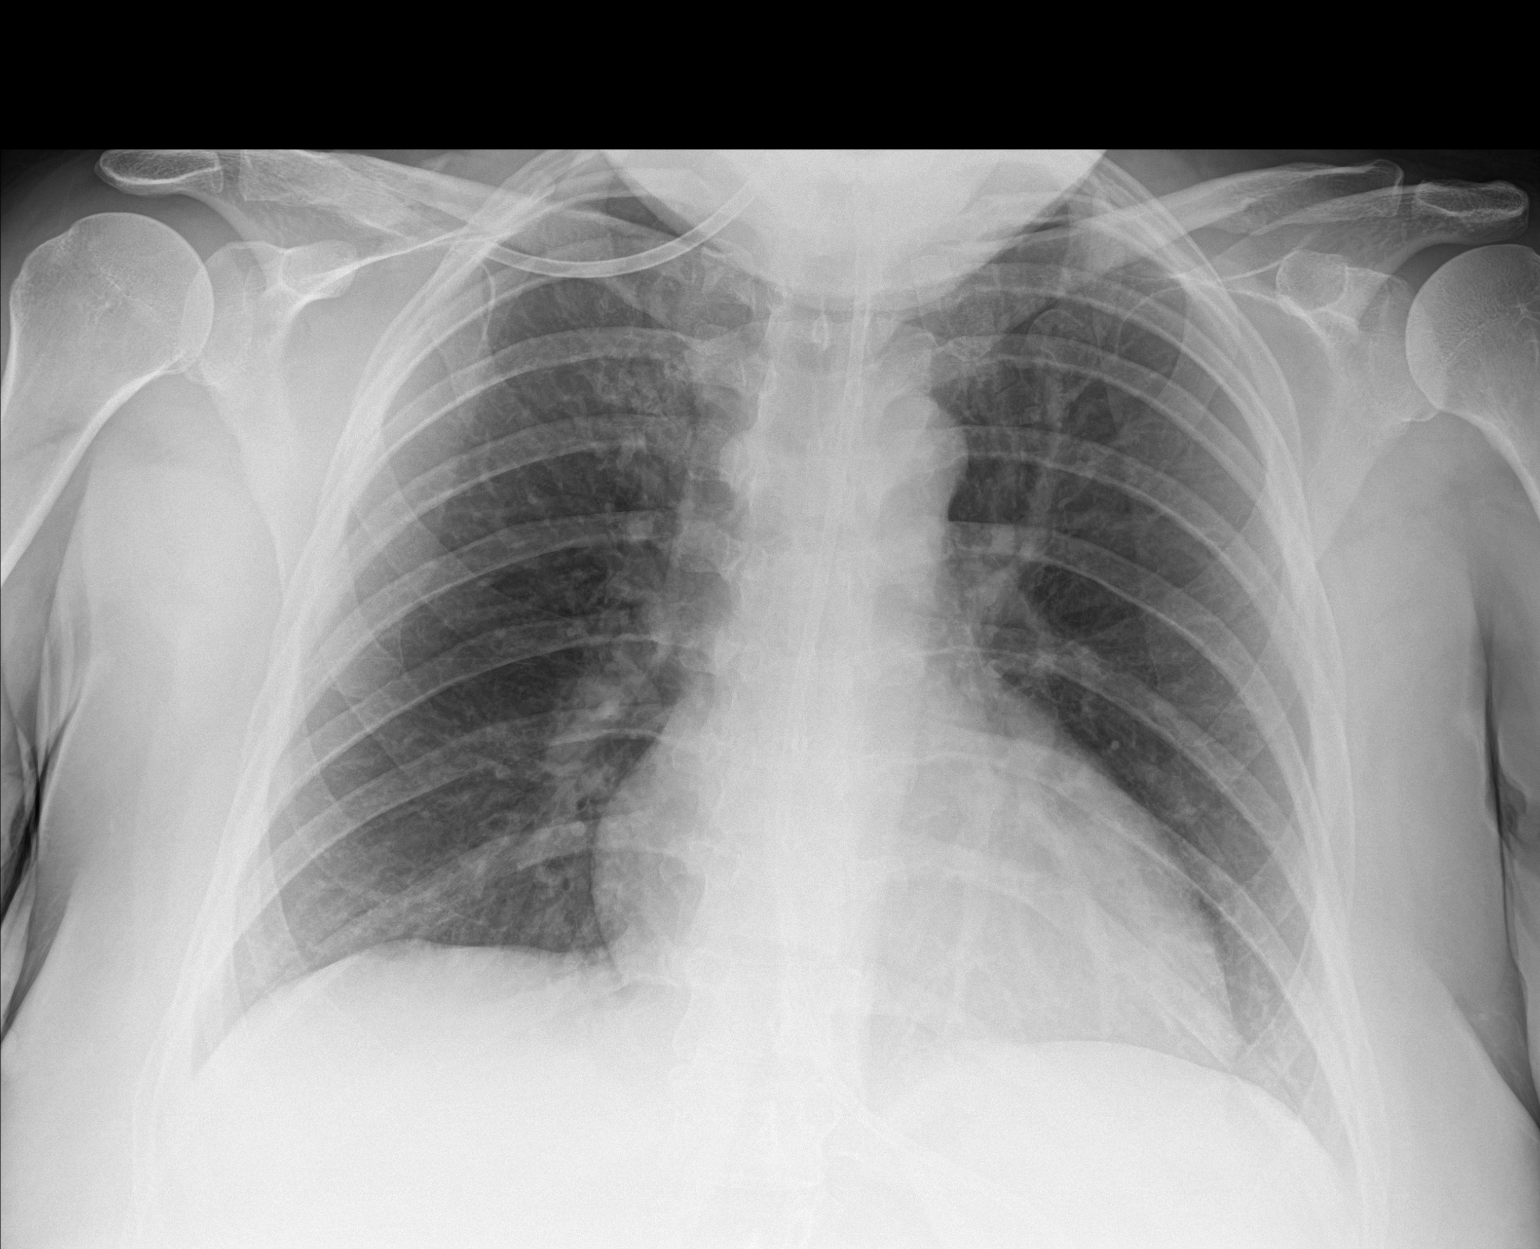

[1 of 1 positions shown; findings below may reference images not displayed]

FINDINGS: Cardiac silhouette normal in size and configuration. Normal
mediastinal and hilar contours.

Clear lungs.  No pleural effusion or pneumothorax.

Enteric tube passes below the diaphragm and below the included field
of view.

Skeletal structures are grossly intact.
IMPRESSION: No active disease.

## 2023-08-17 ENCOUNTER — Other Ambulatory Visit: Payer: Self-pay

## 2023-08-17 ENCOUNTER — Encounter (HOSPITAL_COMMUNITY): Payer: Self-pay | Admitting: *Deleted

## 2023-08-17 ENCOUNTER — Emergency Department (HOSPITAL_COMMUNITY)
Admission: EM | Admit: 2023-08-17 | Discharge: 2023-08-17 | Disposition: A | Discharge status: Home / Self Care | Payer: MEDICAID | Attending: Emergency Medicine | Admitting: Emergency Medicine

## 2023-08-17 DIAGNOSIS — N3010 Interstitial cystitis (chronic) without hematuria: Secondary | ICD-10-CM | POA: Insufficient documentation

## 2023-08-17 DIAGNOSIS — R1030 Lower abdominal pain, unspecified: Secondary | ICD-10-CM | POA: Diagnosis present

## 2023-08-17 LAB — COMPREHENSIVE METABOLIC PANEL WITH GFR
ALT: 26 U/L (ref 0–44)
AST: 25 U/L (ref 15–41)
Albumin: 4.1 g/dL (ref 3.5–5.0)
Alkaline Phosphatase: 53 U/L (ref 38–126)
Anion gap: 11 (ref 5–15)
BUN: 15 mg/dL (ref 6–20)
CO2: 22 mmol/L (ref 22–32)
Calcium: 9.2 mg/dL (ref 8.9–10.3)
Chloride: 101 mmol/L (ref 98–111)
Creatinine, Ser: 0.75 mg/dL (ref 0.44–1.00)
GFR, Estimated: >60 mL/min (ref >60)
Glucose, Bld: 91 mg/dL (ref 70–99)
Potassium: 3.8 mmol/L (ref 3.5–5.1)
Sodium: 134 mmol/L — ABNORMAL LOW (ref 135–145)
Total Bilirubin: 0.8 mg/dL (ref 0.0–1.2)
Total Protein: 6.9 g/dL (ref 6.5–8.1)

## 2023-08-17 LAB — CBC WITH DIFFERENTIAL/PLATELET
Abs Immature Granulocytes: 0.05 10*3/uL (ref 0.00–0.07)
Basophils Absolute: 0.1 10*3/uL (ref 0.0–0.1)
Basophils Relative: 1 %
Eosinophils Absolute: 0.2 10*3/uL (ref 0.0–0.5)
Eosinophils Relative: 3 %
HCT: 43.2 % (ref 36.0–46.0)
Hemoglobin: 14.3 g/dL (ref 12.0–15.0)
Immature Granulocytes: 1 %
Lymphocytes Relative: 26 %
Lymphs Abs: 2.4 10*3/uL (ref 0.7–4.0)
MCH: 31.6 pg (ref 26.0–34.0)
MCHC: 33.1 g/dL (ref 30.0–36.0)
MCV: 95.6 fL (ref 80.0–100.0)
Monocytes Absolute: 0.9 10*3/uL (ref 0.1–1.0)
Monocytes Relative: 10 %
Neutro Abs: 5.8 10*3/uL (ref 1.7–7.7)
Neutrophils Relative %: 59 %
Platelets: 350 10*3/uL (ref 150–400)
RBC: 4.52 MIL/uL (ref 3.87–5.11)
RDW: 13.6 % (ref 11.5–15.5)
WBC: 9.5 10*3/uL (ref 4.0–10.5)
nRBC: 0 % (ref 0.0–0.2)

## 2023-08-17 LAB — URINALYSIS, ROUTINE W REFLEX MICROSCOPIC
Bilirubin Urine: NEGATIVE
Glucose, UA: NEGATIVE mg/dL
Hgb urine dipstick: NEGATIVE
Ketones, ur: NEGATIVE mg/dL
Leukocytes,Ua: NEGATIVE
Nitrite: NEGATIVE
Protein, ur: NEGATIVE mg/dL
Specific Gravity, Urine: 1.01 (ref 1.005–1.030)
pH: 8 (ref 5.0–8.0)

## 2023-08-17 LAB — HCG, SERUM, QUALITATIVE: Preg, Serum: NEGATIVE

## 2023-08-17 LAB — LIPASE, BLOOD: Lipase: 34 U/L (ref 11–51)

## 2023-08-17 MED ORDER — OXYCODONE-ACETAMINOPHEN 5-325 MG PO TABS: 2.0000 | ORAL_TABLET | Freq: Four times a day (QID) | ORAL | 0 refills | Status: DC | PRN

## 2023-08-17 MED ORDER — ONDANSETRON HCL 4 MG/2ML IJ SOLN
4.0000 mg | Freq: Once | INTRAMUSCULAR | Status: AC
  Administered 2023-08-17: 4 mg via INTRAVENOUS
  Filled 2023-08-17: qty 2

## 2023-08-17 MED ORDER — SODIUM CHLORIDE 0.9 % IV BOLUS
500.0000 mL | Freq: Once | INTRAVENOUS | Status: AC
  Administered 2023-08-17: 500 mL via INTRAVENOUS

## 2023-08-17 MED ORDER — HYDROMORPHONE HCL 1 MG/ML IJ SOLN
1.0000 mg | Freq: Once | INTRAMUSCULAR | Status: AC
  Administered 2023-08-17: 1 mg via INTRAVENOUS
  Filled 2023-08-17: qty 1

## 2023-08-17 MED ORDER — HYDROCHLOROTHIAZIDE 25 MG PO TABS
25.0000 mg | ORAL_TABLET | Freq: Once | ORAL | Status: AC
  Administered 2023-08-17: 25 mg via ORAL
  Filled 2023-08-17: qty 1

## 2023-08-17 MED ORDER — LORAZEPAM 2 MG/ML IJ SOLN
0.5000 mg | Freq: Once | INTRAMUSCULAR | Status: AC
  Administered 2023-08-17: 0.5 mg via INTRAVENOUS
  Filled 2023-08-17: qty 1

## 2023-08-17 MED ORDER — IRBESARTAN 300 MG PO TABS
300.0000 mg | ORAL_TABLET | Freq: Every day | ORAL | Status: DC
  Administered 2023-08-17: 300 mg via ORAL
  Filled 2023-08-17: qty 1

## 2023-08-17 MED ORDER — HYDROCHLOROTHIAZIDE 12.5 MG PO CAPS
25.0000 mg | ORAL_CAPSULE | Freq: Once | ORAL | Status: DC
  Filled 2023-08-17: qty 2

## 2023-08-17 MED ORDER — HYDROMORPHONE HCL 1 MG/ML IJ SOLN: 1.0000 mg | Freq: Once | INTRAMUSCULAR | Status: DC

## 2023-08-17 NOTE — Discharge Instructions (Signed)
 Follow-up with your urologist in the next week.  Return if problems

## 2023-08-17 NOTE — ED Provider Notes (Signed)
 [place] EMERGENCY DEPARTMENT AT [facility] Provider Note   CSN: [id number] Arrival date & time: 08/17/23  1040     Patient presents with: Abdominal Pain   Krystal [name] is a 33 y.o. female.  {Add pertinent medical, surgical, social history, OB history to [id number]} Patient has a history of interstitial cystitis.  She complains of lower abdominal pain stating this is similar to her interstitial cystitis   Abdominal Pain      Prior to Admission medications   Medication Sig Start Date End Date Taking? Authorizing Provider  oxyCODONE -acetaminophen  (PERCOCET) 5-325 MG tablet Take 2 tablets by mouth every 6 (six) hours as needed. 08/17/23  Yes Suzette Pac, MD  acetaminophen  (TYLENOL ) 500 MG tablet Take 1,000 mg by mouth 3 (three) times daily as needed (for pain or headaches).    [provider]  albuterol  (VENTOLIN  HFA) 108 (90 Base) MCG/ACT inhaler Inhale 2 puffs into the lungs every 4 (four) hours as needed for wheezing or shortness of breath. 10/15/22   Luke Orlan HERO, DO  BENADRYL  ALLERGY 25 MG capsule Take 25 mg by mouth every 6 (six) hours as needed for allergies or itching.    [provider]  bisacodyl  (DULCOLAX) 10 MG suppository Place 1 suppository (10 mg total) rectally as needed for moderate constipation. 07/04/23   Rizwan, Saima, MD  bupivacaine ,PF, (MARCAINE ) 0.5 % SOLN injection Instill 15 mLs into bladder daily with heparin . 04/06/23     EPINEPHrine  (EPIPEN  2-PAK) 0.3 mg/0.3 mL IJ SOAJ injection Inject 0.3 mg into the muscle as needed for anaphylaxis. 07/13/23   Rothfuss, Jacob T, PA-C  EPINEPHrine  0.3 mg/0.3 mL IJ SOAJ injection Inject 0.3 mg into the muscle as needed for anaphylaxis. 01/15/21   Luke Orlan HERO, DO  FLUoxetine  (PROZAC ) 20 MG capsule Take 3 capsules (60 mg total) by mouth daily. 07/22/23 09/20/23  Rothfuss, Jacob T, PA-C  heparin  10000 UNIT/ML injection Instill 4 mLs (40,000 Units total) into the bladder daily with bupivacaine .  04/06/23     hydrocortisone  (CORTEF ) 10 MG tablet Take 3 tablets (30 mg) by mouth in the morning and 1 tablet (10 mg) at bedtime 07/13/23   Rothfuss, Jacob T, PA-C  hydrOXYzine  (ATARAX ) 25 MG tablet Take 50 mg by mouth at bedtime.    [provider]  irbesartan -hydrochlorothiazide  (AVALIDE) 150-12.5 MG tablet Take 2 tablets by mouth in the morning and at bedtime. 10/06/22 10/06/23  Elnora Ip, MD  levothyroxine  (SYNTHROID ) 150 MCG tablet Take 1 tablet (150 mcg total) by mouth daily at 6 (six) AM. 10/06/22 10/06/23  Elnora Ip, MD  linaclotide  (LINZESS ) 145 MCG CAPS capsule Take 1 capsule (145 mcg total) by mouth daily before breakfast. 04/08/23 06/30/23  Patt Alm Macho, MD  NON FORMULARY Take 1 capsule by mouth See admin instructions. Beef organ capsules- Take 1 capsule by mouth once a day    [provider]  OLANZapine  (ZYPREXA ) 2.5 MG tablet Take 1 tablet (2.5 mg total) by mouth daily. 05/15/23   Arloa Suzen RAMAN, NP  OLANZapine  (ZYPREXA ) 7.5 MG tablet Take 1 tablet (7.5 mg total) by mouth at bedtime. 05/15/23 07/14/23  Arloa Suzen RAMAN, NP  ondansetron  (ZOFRAN -ODT) 4 MG disintegrating tablet Take 4 mg by mouth every 6 (six) hours as needed for nausea or vomiting (dissolve orally).    [provider]  oxyCODONE  (OXY IR/ROXICODONE ) 5 MG immediate release tablet Take 1 tablet (5 mg total) by mouth every 4 (four) hours as needed for severe pain (pain score  7-10). 07/04/23   Rizwan, Saima, MD  pantoprazole  (PROTONIX ) 40 MG tablet Take 1 tablet (40 mg total) by mouth 2 (two) times daily as needed (for heartburn). 07/04/23 08/03/23  Rizwan, Saima, MD  polyethylene glycol powder (GLYCOLAX /MIRALAX ) 17 GM/SCOOP powder Mix 17 grams in beverage and take by mouth daily as directed. 07/04/23   Rizwan, Saima, MD  promethazine  (PHENERGAN ) 25 MG suppository Place 1 suppository (25 mg total) rectally every 6 (six) hours as needed for nausea or vomiting. 04/27/23 06/30/23   Ezenduka, Nkeiruka J, MD  RASPBERRY PO Take 2 capsules by mouth See admin instructions. Raspberry Leaf capsules- Take 2 capsules by mouth once a day    [provider]  Syringe, Disposable, 20 ML MISC Use as directed for daily bladder instillations 04/06/23     traMADol  (ULTRAM ) 50 MG tablet Take 1 tablet (50 mg total) by mouth every 12 (twelve) hours as needed for moderate pain (pain score 4-6). 04/27/23   Ezenduka, Nkeiruka J, MD    Allergies: Bee venom, Coreg  [carvedilol ], Red dye #40 (allura red), Compazine  [prochlorperazine  maleate], Oxycodone , Vicodin [hydrocodone -acetaminophen ], Cymbalta [duloxetine hcl], Ditropan  [oxybutynin ], Elavil [amitriptyline], Motrin  [ibuprofen ], Naprosyn  [naproxen ], Pyrimethamine, Sulfadoxine, Zestril  [lisinopril ], Nifedical xl [nifedipine ], Reglan  [metoclopramide ], and Trandate  [labetalol ]    Review of Systems  Gastrointestinal:  Positive for abdominal pain.    Updated Vital Signs BP (!) 173/110 (BP Location: Right Arm)   Pulse 78   Temp 98.4 F (36.9 C) (Oral)   Resp 16   Ht 5' 11 (1.803 m)   Wt 118.8 kg   LMP 08/11/2023 (Exact Date)   SpO2 95%   BMI 36.54 kg/m   Physical Exam  (all labs ordered are listed, but only abnormal results are displayed) Labs Reviewed  COMPREHENSIVE METABOLIC PANEL WITH GFR - Abnormal; Notable for the following components:      Result Value   Sodium 134 (*)    All other components within normal limits  LIPASE, BLOOD  URINALYSIS, ROUTINE W REFLEX MICROSCOPIC  HCG, SERUM, QUALITATIVE  CBC WITH DIFFERENTIAL/PLATELET    EKG: None  Radiology: No results found.  {Document cardiac monitor, telemetry assessment procedure when appropriate:32947} Procedures   Medications Ordered in the ED  irbesartan  (AVAPRO ) tablet 300 mg (has no administration in time range)  hydrochlorothiazide  (HYDRODIURIL ) tablet 25 mg (has no administration in time range)  LORazepam  (ATIVAN ) injection 0.5 mg (0.5 mg Intravenous Given  08/17/23 1215)  ondansetron  (ZOFRAN ) injection 4 mg (4 mg Intravenous Given 08/17/23 1215)  sodium chloride  0.9 % bolus 500 mL (500 mLs Intravenous New Bag/Given 08/17/23 1217)  HYDROmorphone  (DILAUDID ) injection 1 mg (1 mg Intravenous Given 08/17/23 1215)  HYDROmorphone  (DILAUDID ) injection 1 mg (1 mg Intravenous Given 08/17/23 1344)  ondansetron  (ZOFRAN ) injection 4 mg (4 mg Intravenous Given 08/17/23 1344)      {Click here for ABCD2, HEART and other calculators REFRESH Note before signing:1}                              Medical Decision Making Amount and/or Complexity of Data Reviewed Labs: ordered.  Risk Prescription drug management.   Patient with abdominal pain from interstitial cystitis.  She is discharged home with Percocet and will follow-up with her urologist  {Document critical care time when appropriate  Document review of labs and clinical decision tools ie CHADS2VASC2, etc  Document your independent review of radiology images and any outside records  Document your discussion with family members, caretakers  and with consultants  Document social determinants of health affecting pt's care  Document your decision making why or why not admission, treatments were needed:32947:::1}   Final diagnoses:  Interstitial cystitis    ED Discharge Orders          Ordered    oxyCODONE -acetaminophen  (PERCOCET) 5-325 MG tablet  Every 6 hours PRN        08/17/23 1440

## 2023-08-17 NOTE — ED Triage Notes (Signed)
 Here by POV from home for bladder pain r/t IC. Onset last night. NV onset 0500. No relief with zofran , phenergan , or tramadol . Also no relief with lidocaine  bladder installation at home yesterday. GU MD Dr. Janit. Has vomited x3 since 0500. Rates pain 9/10, tearful. Alert, NAD, calm, interactive. Denies fever or diarrhea. LMP 6/17.

## 2023-09-03 ENCOUNTER — Ambulatory Visit (HOSPITAL_BASED_OUTPATIENT_CLINIC_OR_DEPARTMENT_OTHER): Payer: MEDICAID | Admitting: Student

## 2023-09-03 ENCOUNTER — Encounter (HOSPITAL_BASED_OUTPATIENT_CLINIC_OR_DEPARTMENT_OTHER): Payer: Self-pay | Admitting: Student

## 2023-09-03 ENCOUNTER — Telehealth (HOSPITAL_BASED_OUTPATIENT_CLINIC_OR_DEPARTMENT_OTHER): Payer: Self-pay

## 2023-09-03 ENCOUNTER — Other Ambulatory Visit (HOSPITAL_BASED_OUTPATIENT_CLINIC_OR_DEPARTMENT_OTHER): Payer: Self-pay

## 2023-09-03 VITALS — BP 158/106 | HR 71 | Temp 97.8°F | Resp 16 | Ht 71.0 in | Wt 266.5 lb

## 2023-09-03 DIAGNOSIS — E2740 Unspecified adrenocortical insufficiency: Secondary | ICD-10-CM | POA: Diagnosis not present

## 2023-09-03 DIAGNOSIS — K5904 Chronic idiopathic constipation: Secondary | ICD-10-CM

## 2023-09-03 DIAGNOSIS — I1A0 Resistant hypertension: Secondary | ICD-10-CM

## 2023-09-03 DIAGNOSIS — R600 Localized edema: Secondary | ICD-10-CM | POA: Diagnosis not present

## 2023-09-03 DIAGNOSIS — Z6837 Body mass index (BMI) 37.0-37.9, adult: Secondary | ICD-10-CM

## 2023-09-03 MED ORDER — IRBESARTAN-HYDROCHLOROTHIAZIDE 150-12.5 MG PO TABS: 1.0000 | ORAL_TABLET | Freq: Two times a day (BID) | ORAL | 6 refills | Status: DC

## 2023-09-03 MED ORDER — IRBESARTAN-HYDROCHLOROTHIAZIDE 150-12.5 MG PO TABS: 2.0000 | ORAL_TABLET | Freq: Two times a day (BID) | ORAL | 6 refills | Status: DC

## 2023-09-03 MED ORDER — IRBESARTAN-HYDROCHLOROTHIAZIDE 150-12.5 MG PO TABS: 1.0000 | ORAL_TABLET | Freq: Two times a day (BID) | ORAL | 5 refills | Status: AC

## 2023-09-03 MED ORDER — LINACLOTIDE 145 MCG PO CAPS: 145.0000 ug | ORAL_CAPSULE | Freq: Every day | ORAL | 4 refills | Status: DC

## 2023-09-03 MED ORDER — SPIRONOLACTONE 25 MG PO TABS: 25.0000 mg | ORAL_TABLET | Freq: Every day | ORAL | 3 refills | Status: DC

## 2023-09-03 MED ORDER — LINACLOTIDE 145 MCG PO CAPS: 145.0000 ug | ORAL_CAPSULE | Freq: Every day | ORAL | 4 refills | Status: AC

## 2023-09-03 NOTE — Progress Notes (Signed)
 Established Patient Office Visit  Subjective   Patient ID: Krystal [name], female    DOB: 1990/05/11  Age: 33 y.o. MRN: [id number]  Chief Complaint  Patient presents with   Medical Management of Chronic Issues    Follow up. Refill needed for irbesartan -hydrochlorothiazide , linzess .     HPI  Hypertension-patient notes that her blood pressure has continued to stay elevated at home.  Patient does have a history of adrenal insufficiency, the next option would be spironolactone  though this is likely not a good option as she is prone to getting hyperkalemic with this condition.  We may begin either hydralazine  or clonidine  in the future.  Adrenal insufficiency-has now had her first visit with Dr. Faythe in endocrinology.  He has decreased her dose of Cortef  in light of her fatigue and overall edema.  Patient notes that edema has not decreased at this point.  Last echocardiogram was 06/17/2021 and showed an ejection fraction of 60 to 65% with the only abnormality noting some mild LVH and mild mitral valve regurg.  Borderline personality disorder/MDD-patient has recently started at [facility] in [place].  They are now covering her psychiatric diagnoses.  Constipation-patient requires a refill of Linzess .  Patient Active Problem List   Diagnosis Date Noted   Chronic idiopathic constipation 09/04/2023   Bee allergy status 07/09/2023   BMI 36.0-36.9,adult 07/09/2023   Pelvic pain, chronic 07/04/2023   Anxiety and depression 07/04/2023   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/30/2023   Intractable nausea and vomiting 03/13/2023   Intractable vomiting with nausea 03/13/2023   Encounter for screening involving social determinants of health (SDoH) 10/07/2022   Chronic interstitial cystitis 05/05/2022   Chronic diarrhea 04/29/2022   Lower abdominal pain 04/28/2022   Adrenal insufficiency (HCC) 04/26/2022   Bradycardia 04/24/2022   Moderate persistent asthma with acute  exacerbation 12/04/2021   Cervicalgia 10/31/2021   Borderline personality disorder in adult [facility]) 10/21/2021   Anisocoria 07/02/2021   History of excision of intestinal structure 05/23/2021   Attention deficit disorder 04/17/2021   Chronic fatigue syndrome 04/17/2021   Anosmia 04/17/2021   Chronic urticaria 01/15/2021   Local reaction to hymenoptera sting 01/15/2021   Perennial allergic rhinitis 01/15/2021   MDD (major depressive disorder), recurrent episode, severe (HCC) 11/19/2020   Fibromyalgia 05/11/2020   Gastroesophageal reflux disease 05/11/2020   Interstitial cystitis 05/11/2020   PTSD (post-traumatic stress disorder) 05/11/2020   Primary insomnia 05/11/2020   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   Hypothyroidism due to Hashimoto's thyroiditis 10/03/2019   Anxiety 06/16/2019   Asthma 06/07/2019   Obesity 06/07/2019   Lumbar spondylosis 06/23/2018   Sacroiliac inflammation (HCC) 06/23/2018   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   History of upper limb amputation, wrist, 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   Past Medical History:  Diagnosis Date   Acute acalculous cholecystitis s/p lap cholecystectomy 05/11/2020 05/11/2020   Angio-edema 01/15/2021   Anxiety    Asthma    Chronic interstitial cystitis    Depression    HELLP (hemolytic anemia/elev liver enzymes/low platelets in pregnancy) 12/08/2017   Hemorrhagic cyst of left ovary 04/15/2022   History of borderline personality disorder    History of HELLP syndrome, currently pregnant 04/21/2019   Formatting of this note might be different from the original.  History of HELLP syndrome at 26 weeks 5 days (12/07/2017); admitted to [facility] with BP 200/120s, Plt 52, AST/ALT 471/375  APS evaluation negative (04/2019)   Daily ASA 81 mg   Challenging blood pressure control in current pregnancy  Plan:  - Baseline pre-eclampsia labs this pregnancy within normal limits (Cr: 0.71; urine p:c 0.106)  -    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  03/05/2022   Hypothyroidism due to Hashimoto's thyroiditis    IC (interstitial cystitis)    Kidney stones    Migraines    Necrotizing fasciitis (HCC)    left hand amputation   PTSD (post-traumatic stress disorder)    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   Severe uncontrolled hypertension 10/08/2017   UTI (urinary tract infection)    Social History   Tobacco Use   Smoking status: Never    Passive exposure: Never   Smokeless tobacco: Never   Tobacco comments:    She tried a cigarette and never smoked again  Vaping Use   Vaping status: Every Day   Devices: DELTA- 8  Substance Use Topics   Alcohol use: Yes    Comment: bottle of wine a week-occ   Drug use: Yes    Comment: DELTA-8   Allergies  Allergen Reactions   Bee Venom Anaphylaxis, Swelling and Other (See Comments)    Swelling of face, throat. Airway restriction.   Coreg  [Carvedilol ] Shortness Of Breath and Other (See Comments)    Triggers asthma   Red Dye #40 (Allura Red) Other (See Comments)    Migraines   Compazine  [Prochlorperazine  Maleate] Other (See Comments)    Twitching Able to tolerate promethazine    Vicodin [Hydrocodone -Acetaminophen ] Itching, Nausea And Vomiting and Other (See Comments)    OK with Tylenol    Cymbalta [Duloxetine Hcl] Other (See Comments)    Irritability  Syncope    Ditropan  [Oxybutynin ] Other (See Comments)    Severe dry mouth   Elavil [Amitriptyline] Other (See Comments)    Irritability    Motrin  [Ibuprofen ] Other (See Comments)    Heartburn    Naprosyn  [Naproxen ] Other (See Comments)    Heartburn    Pyrimethamine Other (See Comments)    From Fansidar combination drug Triggers asthma   Sulfadoxine Other (See Comments)    From Fansidar combination drug Triggers asthma    Zestril  [Lisinopril ] Cough   Nifedical Xl [Nifedipine ] Nausea And Vomiting   Reglan  [Metoclopramide ] Swelling and Other (See Comments)    Restlessness Able to take with Benadryl    Trandate  [Labetalol ] Nausea And  Vomiting and Other (See Comments)    Triggers asthma       ROS Per HPI.    Objective:     BP (!) 158/106   Pulse 71   Temp 97.8 F (36.6 C) (Oral)   Resp 16   Ht 5' 11 (1.803 m)   Wt 266 lb 8 oz (120.9 kg)   LMP 08/11/2023 (Exact Date)   SpO2 96%   BMI 37.17 kg/m  BP Readings from Last 3 Encounters:  09/03/23 (!) 158/106  08/17/23 (!) 173/110  07/09/23 (!) 147/94   Wt Readings from Last 3 Encounters:  09/03/23 266 lb 8 oz (120.9 kg)  08/17/23 262 lb (118.8 kg)  07/09/23 262 lb 8 oz (119.1 kg)      Physical Exam Constitutional:      General: She is not in acute distress.    Appearance: Normal appearance. She is not ill-appearing.  HENT:     Head: Normocephalic and atraumatic.     Nose: Nose normal.  Eyes:     General: No scleral icterus.    Conjunctiva/sclera: Conjunctivae normal.  Cardiovascular:     Rate and Rhythm: Normal rate and regular rhythm.     Heart sounds:  Normal heart sounds. No murmur heard.    No friction rub.  Pulmonary:     Effort: Pulmonary effort is normal. No respiratory distress.     Breath sounds: Normal breath sounds. No wheezing, rhonchi or rales.  Musculoskeletal:        General: Normal range of motion.     Right lower leg: Edema present.     Left lower leg: Edema present.  Skin:    General: Skin is warm and dry.     Coloration: Skin is not jaundiced or pale.  Neurological:     General: No focal deficit present.     Mental Status: She is alert.  Psychiatric:        Mood and Affect: Mood normal.        Behavior: Behavior normal.      No results found for any visits on 09/03/23.  Last CBC Lab Results  Component Value Date   WBC 9.5 08/17/2023   HGB 14.3 08/17/2023   HCT 43.2 08/17/2023   MCV 95.6 08/17/2023   MCH 31.6 08/17/2023   RDW 13.6 08/17/2023   PLT 350 08/17/2023   Last metabolic panel Lab Results  Component Value Date   GLUCOSE 91 08/17/2023   NA 134 (L) 08/17/2023   K 3.8 08/17/2023   CL 101  08/17/2023   CO2 22 08/17/2023   BUN 15 08/17/2023   CREATININE 0.75 08/17/2023   GFRNONAA >60 08/17/2023   CALCIUM  9.2 08/17/2023   PHOS 3.9 04/22/2023   PROT 6.9 08/17/2023   ALBUMIN 4.1 08/17/2023   LABGLOB 2.4 07/09/2023   AGRATIO 1.9 08/14/2021   BILITOT 0.8 08/17/2023   ALKPHOS 53 08/17/2023   AST 25 08/17/2023   ALT 26 08/17/2023   ANIONGAP 11 08/17/2023   Last lipids Lab Results  Component Value Date   CHOL 185 07/09/2023   HDL 38 (L) 07/09/2023   LDLCALC 107 (H) 07/09/2023   TRIG 233 (H) 07/09/2023   CHOLHDL 4.9 (H) 07/09/2023   Last hemoglobin A1c Lab Results  Component Value Date   HGBA1C 5.2 07/09/2023      The ASCVD Risk score (Arnett DK, et al., 2019) failed to calculate for the following reasons:   The 2019 ASCVD risk score is only valid for ages 37 to 43    Assessment & Plan:   Resistant hypertension Assessment & Plan: Chronic, not at goal.  Called in spironolactone  at first but discontinued due to history of adrenal insufficiency in case of adrenal crisis in the future we would not want her potassium to elevate further. - Continue Avalide. - Continued issues with a beta-blocker, calcium  channel blocker, and ACE inhibitor all on her allergy list.  We may start hydralazine  in the future. - Assess brain natruretic peptide due to pedal edema. - Assess baseline basic metabolic panel.  Orders: -     Basic metabolic panel with GFR; Future -     Brain natriuretic peptide; Future  Adrenal insufficiency (HCC) Assessment & Plan: Presence of secondary adrenal insufficiency.  We should no longer need to refill Cortef  for her as she is now in with endocrinology.  She is following with Dr. Faythe.  Patient notes that Cortef  dose has recently been decreased in light of fatigue and overall edema. - Continue to monitor for overall approach to care.   Pedal edema -     Brain natriuretic peptide; Future  Chronic idiopathic constipation Assessment &  Plan: Refill Linzess .  Orders: -  linaCLOtide ; Take 1 capsule (145 mcg total) by mouth daily before breakfast.  Dispense: 30 capsule; Refill: 4   Return in about 4 weeks (around 10/01/2023) for HTN.   I personally spent a total of 27 minutes in the care of the patient today including getting/reviewing separately obtained history, performing a medically appropriate exam/evaluation, counseling and educating, placing orders, and documenting clinical information in the EHR.   Ernesteen Mihalic T Aamya Orellana, PA-C

## 2023-09-03 NOTE — Telephone Encounter (Signed)
 Called [facility] to correct rx and sent in new irbesartan -hydrochlorothiazide .

## 2023-09-03 NOTE — Telephone Encounter (Signed)
 Reason for CRM: Robin with [facility], calling to inquire on current dosing of medication,    irbesartan -hydrochlorothiazide  (AVALIDE) 150-12.5 MG tablet.        States prescription is written for over the maximum required dosing as per manufacturer.        Requesting a return call to discuss further. Please contact to advise further at [phone].        Preferred pharmacy:        [facility] [address], [place] - [address]    [address] [facility] [place] [address]    Phone: [phone] Fax: [phone]

## 2023-09-04 DIAGNOSIS — K5904 Chronic idiopathic constipation: Secondary | ICD-10-CM | POA: Insufficient documentation

## 2023-09-04 NOTE — Assessment & Plan Note (Signed)
 Chronic, not at goal.  Called in spironolactone  at first but discontinued due to history of adrenal insufficiency in case of adrenal crisis in the future we would not want her potassium to elevate further. - Continue Avalide. - Continued issues with a beta-blocker, calcium  channel blocker, and ACE inhibitor all on her allergy list.  We may start hydralazine  in the future. - Assess brain natruretic peptide due to pedal edema. - Assess baseline basic metabolic panel.

## 2023-09-04 NOTE — Assessment & Plan Note (Signed)
 Presence of secondary adrenal insufficiency.  We should no longer need to refill Cortef  for her as she is now in with endocrinology.  She is following with Dr. Faythe.  Patient notes that Cortef  dose has recently been decreased in light of fatigue and overall edema. - Continue to monitor for overall approach to care.

## 2023-09-04 NOTE — Assessment & Plan Note (Signed)
Refill Linzess

## 2023-09-07 ENCOUNTER — Other Ambulatory Visit: Payer: Self-pay

## 2023-09-07 ENCOUNTER — Emergency Department (HOSPITAL_COMMUNITY)
Admission: EM | Admit: 2023-09-07 | Discharge: 2023-09-07 | Disposition: A | Discharge status: Home / Self Care | Payer: MEDICAID | Attending: Emergency Medicine | Admitting: Emergency Medicine

## 2023-09-07 ENCOUNTER — Encounter (HOSPITAL_COMMUNITY): Payer: Self-pay | Admitting: *Deleted

## 2023-09-07 ENCOUNTER — Emergency Department (HOSPITAL_COMMUNITY): Payer: MEDICAID

## 2023-09-07 DIAGNOSIS — R112 Nausea with vomiting, unspecified: Secondary | ICD-10-CM | POA: Diagnosis present

## 2023-09-07 DIAGNOSIS — J45909 Unspecified asthma, uncomplicated: Secondary | ICD-10-CM | POA: Diagnosis not present

## 2023-09-07 DIAGNOSIS — E039 Hypothyroidism, unspecified: Secondary | ICD-10-CM | POA: Insufficient documentation

## 2023-09-07 DIAGNOSIS — N3010 Interstitial cystitis (chronic) without hematuria: Secondary | ICD-10-CM | POA: Diagnosis not present

## 2023-09-07 LAB — CBC WITH DIFFERENTIAL/PLATELET
Abs Immature Granulocytes: 0.03 K/uL (ref 0.00–0.07)
Basophils Absolute: 0.1 K/uL (ref 0.0–0.1)
Basophils Relative: 1 %
Eosinophils Absolute: 0.1 K/uL (ref 0.0–0.5)
Eosinophils Relative: 2 %
HCT: 42.8 % (ref 36.0–46.0)
Hemoglobin: 14.5 g/dL (ref 12.0–15.0)
Immature Granulocytes: 0 %
Lymphocytes Relative: 19 %
Lymphs Abs: 1.5 K/uL (ref 0.7–4.0)
MCH: 31.9 pg (ref 26.0–34.0)
MCHC: 33.9 g/dL (ref 30.0–36.0)
MCV: 94.1 fL (ref 80.0–100.0)
Monocytes Absolute: 0.5 K/uL (ref 0.1–1.0)
Monocytes Relative: 7 %
Neutro Abs: 5.7 K/uL (ref 1.7–7.7)
Neutrophils Relative %: 71 %
Platelets: 336 K/uL (ref 150–400)
RBC: 4.55 MIL/uL (ref 3.87–5.11)
RDW: 13.3 % (ref 11.5–15.5)
WBC: 8 K/uL (ref 4.0–10.5)
nRBC: 0 % (ref 0.0–0.2)

## 2023-09-07 LAB — URINALYSIS, ROUTINE W REFLEX MICROSCOPIC
Bacteria, UA: NONE SEEN
Bilirubin Urine: NEGATIVE
Glucose, UA: NEGATIVE mg/dL
Ketones, ur: NEGATIVE mg/dL
Leukocytes,Ua: NEGATIVE
Nitrite: NEGATIVE
Protein, ur: 30 mg/dL — AB
Specific Gravity, Urine: 1.021 (ref 1.005–1.030)
pH: 8 (ref 5.0–8.0)

## 2023-09-07 LAB — BASIC METABOLIC PANEL WITH GFR
Anion gap: 9 (ref 5–15)
BUN: 17 mg/dL (ref 6–20)
CO2: 27 mmol/L (ref 22–32)
Calcium: 9.6 mg/dL (ref 8.9–10.3)
Chloride: 100 mmol/L (ref 98–111)
Creatinine, Ser: 0.9 mg/dL (ref 0.44–1.00)
GFR, Estimated: >60 mL/min (ref >60)
Glucose, Bld: 104 mg/dL — ABNORMAL HIGH (ref 70–99)
Potassium: 3.8 mmol/L (ref 3.5–5.1)
Sodium: 136 mmol/L (ref 135–145)

## 2023-09-07 LAB — HCG, SERUM, QUALITATIVE: Preg, Serum: NEGATIVE

## 2023-09-07 MED ORDER — MORPHINE SULFATE (PF) 2 MG/ML IV SOLN
2.0000 mg | Freq: Once | INTRAVENOUS | Status: AC
  Administered 2023-09-07: 2 mg via INTRAVENOUS
  Filled 2023-09-07: qty 1

## 2023-09-07 MED ORDER — ONDANSETRON HCL 4 MG PO TABS
8.0000 mg | ORAL_TABLET | Freq: Once | ORAL | Status: AC
  Administered 2023-09-07: 8 mg via ORAL
  Filled 2023-09-07: qty 2

## 2023-09-07 MED ORDER — OXYCODONE-ACETAMINOPHEN 5-325 MG PO TABS
1.0000 | ORAL_TABLET | Freq: Once | ORAL | Status: AC
  Administered 2023-09-07: 1 via ORAL
  Filled 2023-09-07: qty 1

## 2023-09-07 MED ORDER — OXYCODONE-ACETAMINOPHEN 5-325 MG PO TABS: 1.0000 | ORAL_TABLET | Freq: Four times a day (QID) | ORAL | 0 refills | Status: DC | PRN

## 2023-09-07 MED ORDER — OXYCODONE-ACETAMINOPHEN 5-325 MG PO TABS: 1.0000 | ORAL_TABLET | Freq: Once | ORAL | Status: DC

## 2023-09-07 NOTE — ED Triage Notes (Signed)
 Here by POV from home for bladder pain r/t IC. Onset Saturday night. Also associated NV. No relief with zofran , or tramadol . Also no relief with lidocaine  bladder installation at home yesterday. GU MD Dr. Janit. Last saw GU PA on 6/26. Rates pain 9/10, tearful. Alert, NAD, calm, interactive. Denies fever or diarrhea.

## 2023-09-07 NOTE — Discharge Instructions (Signed)
 You were seen today for interstitial cystitis flareup.  recommend you follow-up with your IC doctor for further management.  I will prescribe a short dose of Percocet to help get you over to see him.  Use this only for extreme pain, using your tramadol , Tylenol  as first-line as we do not want to lower your tolerance as well as raise your risk for narcotic use.  Return to the ED if you intervene new or worsening symptoms

## 2023-09-07 NOTE — ED Provider Notes (Signed)
 [place] EMERGENCY DEPARTMENT AT [facility] Provider Note   CSN: [id number] Arrival date & time: 09/07/23  1052     Patient presents with: bladder pain   Krystal [name] is a 33 y.o. female.  HPI Patient is a 33 year old female to the ED today with a 1 day history of bladder pain which she describes as sharp and stabbing and accompanied with nausea and vomiting and left-sided flank pain radiating to left abdomen.  Reports that she is felt similarly during previous flareups of her interstitial cystitis.  Previous medical history of adrenal insufficiency currently on Cortef , asthma, interstitial cystitis, necrotizing fasciitis, migraines, anxiety, asthma.  Notes that her symptoms had improved when she last talked to her urologist with being treated with doxycycline  for some vaginal discharge that she was experiencing in June.  Worse is diarrhea yesterday.  Denies fever, headache, vision changes, chest pain, shortness of breath, hematemesis, cough, congestion,      Prior to Admission medications   Medication Sig Start Date End Date Taking? Authorizing Provider  oxyCODONE -acetaminophen  (PERCOCET/ROXICET) 5-325 MG tablet Take 1 tablet by mouth every 6 (six) hours as needed for severe pain (pain score 7-10). 09/07/23  Yes Beola Terrall RAMAN, PA-C  acetaminophen  (TYLENOL ) 500 MG tablet Take 1,000 mg by mouth 3 (three) times daily as needed (for pain or headaches).    [provider]  albuterol  (VENTOLIN  HFA) 108 (90 Base) MCG/ACT inhaler Inhale 2 puffs into the lungs every 4 (four) hours as needed for wheezing or shortness of breath. 10/15/22   Luke Orlan HERO, DO  BENADRYL  ALLERGY 25 MG capsule Take 25 mg by mouth every 6 (six) hours as needed for allergies or itching.    [provider]  bupivacaine ,PF, (MARCAINE ) 0.5 % SOLN injection Instill 15 mLs into bladder daily with heparin . 04/06/23     EPINEPHrine  0.3 mg/0.3 mL IJ SOAJ injection Inject 0.3 mg into the  muscle as needed for anaphylaxis. 01/15/21   Luke Orlan HERO, DO  FLUoxetine  (PROZAC ) 20 MG capsule Take 3 capsules (60 mg total) by mouth daily. 07/22/23 09/20/23  Rothfuss, Jacob T, PA-C  heparin  10000 UNIT/ML injection Instill 4 mLs (40,000 Units total) into the bladder daily with bupivacaine . 04/06/23     hydrocortisone  (CORTEF ) 10 MG tablet Take 10 mg by mouth. 15mg  in the AM & 5 in PM    [provider]  hydrOXYzine  (ATARAX ) 25 MG tablet Take 50 mg by mouth at bedtime.    [provider]  irbesartan -hydrochlorothiazide  (AVALIDE) 150-12.5 MG tablet Take 1 tablet by mouth in the morning and at bedtime. 09/03/23 10/03/23  Rothfuss, Jacob T, PA-C  levothyroxine  (SYNTHROID ) 150 MCG tablet Take 1 tablet (150 mcg total) by mouth daily at 6 (six) AM. 10/06/22 10/06/23  Elnora Ip, MD  linaclotide  (LINZESS ) 145 MCG CAPS capsule Take 1 capsule (145 mcg total) by mouth daily before breakfast. 09/03/23   Rothfuss, Jacob T, PA-C  NON FORMULARY Take 1 capsule by mouth See admin instructions. Beef organ capsules- Take 1 capsule by mouth once a day    [provider]  OLANZapine  (ZYPREXA ) 10 MG tablet Take 10 mg by mouth at bedtime.    [provider]  OLANZapine  (ZYPREXA ) 2.5 MG tablet Take 2.5 mg by mouth daily.    [provider]  ondansetron  (ZOFRAN -ODT) 4 MG disintegrating tablet Take 4 mg by mouth every 6 (six) hours as needed for nausea or vomiting (dissolve orally).    [provider]  pantoprazole  (PROTONIX ) 40 MG tablet Take 1 tablet (40 mg total) by mouth 2 (two) times daily as needed (for heartburn). 07/04/23 09/03/23  Rizwan, Saima, MD  RASPBERRY PO Take 2 capsules by mouth See admin instructions. Raspberry Leaf capsules- Take 2 capsules by mouth once a day    [provider]  Syringe, Disposable, 20 ML MISC Use as directed for daily bladder instillations 04/06/23     traMADol  (ULTRAM ) 50 MG tablet Take 1 tablet (50 mg total) by mouth every  12 (twelve) hours as needed for moderate pain (pain score 4-6). 04/27/23   Ezenduka, Nkeiruka J, MD    Allergies: Bee venom, Coreg  [carvedilol ], Red dye #40 (allura red), Compazine  [prochlorperazine  maleate], Vicodin [hydrocodone -acetaminophen ], Cymbalta [duloxetine hcl], Ditropan  [oxybutynin ], Elavil [amitriptyline], Motrin  [ibuprofen ], Naprosyn  [naproxen ], Pyrimethamine, Sulfadoxine, Zestril  [lisinopril ], Nifedical xl [nifedipine ], Reglan  [metoclopramide ], and Trandate  [labetalol ]    Review of Systems  Gastrointestinal:  Positive for abdominal pain, nausea and vomiting.  All other systems reviewed and are negative.   Updated Vital Signs BP (!) 187/115 (BP Location: Right Arm)   Pulse 74   Temp 98.1 F (36.7 C) (Oral)   Resp 16   Wt 120.7 kg   LMP 08/11/2023 (Exact Date)   SpO2 99%   BMI 37.10 kg/m   Physical Exam Vitals and nursing note reviewed.  Constitutional:      General: She is not in acute distress.    Appearance: Normal appearance. She is not ill-appearing or diaphoretic.  HENT:     Head: Normocephalic and atraumatic.  Eyes:     General:        Right eye: No discharge.        Left eye: No discharge.     Extraocular Movements: Extraocular movements intact.     Conjunctiva/sclera: Conjunctivae normal.  Cardiovascular:     Rate and Rhythm: Normal rate and regular rhythm.     Pulses: Normal pulses.     Heart sounds: Normal heart sounds. No murmur heard.    No friction rub. No gallop.  Pulmonary:     Effort: Pulmonary effort is normal. No respiratory distress.     Breath sounds: Normal breath sounds. No wheezing.  Abdominal:     General: Abdomen is flat.     Palpations: Abdomen is soft.     Tenderness: There is abdominal tenderness (Suprapubic and left lower quadrant tenderness noted to palpation, mild). There is left CVA tenderness. There is no right CVA tenderness or guarding.  Musculoskeletal:     Cervical back: Normal range of motion. No rigidity.     Right  lower leg: No edema.     Left lower leg: No edema.  Skin:    General: Skin is warm and dry.     Findings: No bruising, erythema or lesion.  Neurological:     General: No focal deficit present.     Mental Status: She is alert. Mental status is at baseline.     Motor: No weakness.     Gait: Gait normal.  Psychiatric:        Mood and Affect: Mood normal.     (all labs ordered are listed, but only abnormal results are displayed) Labs Reviewed  URINALYSIS, ROUTINE W REFLEX MICROSCOPIC - Abnormal; Notable for the following components:      Result Value   APPearance HAZY (*)    Hgb urine dipstick SMALL (*)    Protein, ur 30 (*)    All other components within normal limits  BASIC  METABOLIC PANEL WITH GFR - Abnormal; Notable for the following components:   Glucose, Bld 104 (*)    All other components within normal limits  URINE CULTURE  CBC WITH DIFFERENTIAL/PLATELET  HCG, SERUM, QUALITATIVE    EKG: None  Radiology: CT Renal Stone Study Result Date: 09/07/2023 CLINICAL DATA:  Bladder pain EXAM: CT ABDOMEN AND PELVIS WITHOUT CONTRAST TECHNIQUE: Multidetector CT imaging of the abdomen and pelvis was performed following the standard protocol without IV contrast. RADIATION DOSE REDUCTION: This exam was performed according to the departmental dose-optimization program which includes automated exposure control, adjustment of the mA and/or kV according to patient size and/or use of iterative reconstruction technique. COMPARISON:  CT abdomen and pelvis dated 07/06/2023 and multiple priors FINDINGS: Lower chest: No focal consolidation or pulmonary nodule in the lung bases. Mild centrilobular emphysema. No pleural effusion or pneumothorax demonstrated. Partially imaged heart size is normal. Hepatobiliary: No focal hepatic lesions. No intra or extrahepatic biliary ductal dilation. Cholecystectomy. Pancreas: No focal lesions or main ductal dilation. Spleen: Normal in size without focal abnormality.  Adrenals/Urinary Tract: No adrenal nodules. No suspicious renal mass, calculi or hydronephrosis. Urinary bladder is underdistended. No focal mural thickening. Stomach/Bowel: Normal appearance of the stomach. No evidence of bowel wall thickening, distention, or inflammatory changes. Appendix is not discretely seen. Vascular/Lymphatic: No significant vascular findings are present. No enlarged abdominal or pelvic lymph nodes. Reproductive: No adnexal masses. Other: No free fluid, fluid collection, or free air. 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 IMPRESSION: 1. No acute abdominopelvic findings. 2. Mild centrilobular emphysema.  Emphysema (ICD10-J43.9). Electronically Signed   By: Limin  Xu M.D.   On: 09/07/2023 14:21     Procedures   Medications Ordered in the ED  oxyCODONE -acetaminophen  (PERCOCET/ROXICET) 5-325 MG per tablet 1 tablet (has no administration in time range)  ondansetron  (ZOFRAN ) tablet 8 mg (8 mg Oral Given 09/07/23 1220)  morphine  (PF) 2 MG/ML injection 2 mg (2 mg Intravenous Given 09/07/23 1250)                                Medical Decision Making Amount and/or Complexity of Data Reviewed Labs: ordered.   This patient is a 33 year old female  who presents to the ED for concern of suprapubic pain, left flank pain radiating to left lower quadrant accompanied with nausea and vomiting.  Notably having appeared history of adrenal insufficiency on Cortef  and interstitial cystitis with urinary urgency.  Recently treated with doxycycline  for vaginal discharge and had resolution of symptoms at that time.  Symptoms currently present for 1 day.  Reporting no dysuria but is stating that she has sharp shooting pain over her suprapubic area.  No relief with lidocaine  bladder instillation yesterday.  On physical exam, patient is in no acute distress, afebrile, alert  and orient x 4, speaking in full sentences, nontachypneic, nontachycardic.  Notably does have left CVA tenderness, left lower quadrant abdominal tenderness and CVA tenderness.  However exam is otherwise unremarkable.  Lab work imaging were unremarkable.  Will culture.  Low suspicion for any emergent causes of patient's symptoms today.  Patient blood pressure was elevated however on reevaluation, systolic was 160.  Will have her continue to follow-up with PCP for further management as there were talks for her to start hydralazine .  Recommend that she also follow-up with urology for further management of her  IC and will provide short course of Percocet today per her request.  Alerted her that we will not be able to do this often due to her use and we did not want to increase tolerance of medications.  Patient vital signs have remained stable throughout the course of patient's time in the ED. Low suspicion for any other emergent pathology at this time. I believe this patient is safe to be discharged. Provided strict return to ER precautions. Patient expressed agreement and understanding of plan. All questions were answered.  Differential diagnoses prior to evaluation: The emergent differential diagnosis includes, but is not limited to, IC, mesenteric ischemia, diverticulitis, nephrolithiasis, constipation, bowel obstruction, IBD,  ectopic pregnancy, ovarian torsion, PID,  . This is not an exhaustive differential.   Past Medical History / Co-morbidities / Social History: Interstitial cystitis, anxiety, migraines, asthma, HELLP, hypothyroidism, necrotizing fasciitis.   Additional history: Chart reviewed. Pertinent results include: Seen 14 times in the last 6 months for same issues.  Last seen by urology on 08/20/2023 noting vaginal discharge resolved with treatment of Ureaplasma urealyticum with dyspareunia also improved.  Believed to be an incomplete emptying issue and not an urgency issue.  Seen by PCP  on 09/03/2023 for resistant hypertension noting to have history of adrenal insufficiency with visit to endocrinology on 07/09/2023.  Started on spironolactone  but stopped due to concerns for increasing her potassium.  PCP considering starting hydralazine .   Provided IV lidocaine  in April which seemed to help resolve symptoms per the renal colic protocol.  Lab Tests/Imaging studies: I personally interpreted labs/imaging and the pertinent results include:    CBC unremarkable BMP unremarkable UA does note small hemoglobin as well as protein but otherwise unremarkable hCG negative. .  Renal CT shows emphysema but no acute abnormalities otherwise. I agree with the radiologist interpretation.    Medications: I ordered medication including morphine , Zofran , Percocet.  I have reviewed the patients home medicines and have made adjustments as needed.  Critical Interventions: None  Social Determinants of Health: Notably is able to have good follow-up with urologist.  Discharge in June as above  Disposition: After consideration of the diagnostic results and the patients response to treatment, I feel that the patient would benefit from discharge and treatment as above.   emergency department workup does not suggest an emergent condition requiring admission or immediate intervention beyond what has been performed at this time. The plan is: Follow-up with PCP for BP check, follow-up with urology for IC, return to the ED for any new or worsening symptoms. The patient is safe for discharge and has been instructed to return immediately for worsening symptoms, change in symptoms or any other concerns.    Final diagnoses:  IC (interstitial cystitis)    ED Discharge Orders          Ordered    oxyCODONE -acetaminophen  (PERCOCET/ROXICET) 5-325 MG tablet  Every 6 hours PRN        09/07/23 1432               Sheralyn Pinegar S, PA-C 09/07/23 1438    Laurice Maude BROCKS, MD 09/07/23 1441

## 2023-09-08 LAB — URINE CULTURE: Culture: <10000 — AB

## 2023-09-10 ENCOUNTER — Other Ambulatory Visit (HOSPITAL_BASED_OUTPATIENT_CLINIC_OR_DEPARTMENT_OTHER): Payer: MEDICAID

## 2023-09-22 ENCOUNTER — Encounter (HOSPITAL_BASED_OUTPATIENT_CLINIC_OR_DEPARTMENT_OTHER): Payer: Self-pay

## 2023-10-01 ENCOUNTER — Encounter (HOSPITAL_BASED_OUTPATIENT_CLINIC_OR_DEPARTMENT_OTHER): Payer: Self-pay | Admitting: Student

## 2023-10-01 ENCOUNTER — Ambulatory Visit (INDEPENDENT_AMBULATORY_CARE_PROVIDER_SITE_OTHER): Payer: MEDICAID | Admitting: Student

## 2023-10-01 VITALS — BP 158/107 | HR 80 | Temp 98.6°F | Resp 16 | Ht 71.0 in | Wt 267.7 lb

## 2023-10-01 DIAGNOSIS — N3010 Interstitial cystitis (chronic) without hematuria: Secondary | ICD-10-CM

## 2023-10-01 DIAGNOSIS — R52 Pain, unspecified: Secondary | ICD-10-CM | POA: Diagnosis not present

## 2023-10-01 DIAGNOSIS — K219 Gastro-esophageal reflux disease without esophagitis: Secondary | ICD-10-CM | POA: Diagnosis not present

## 2023-10-01 DIAGNOSIS — I1A0 Resistant hypertension: Secondary | ICD-10-CM | POA: Diagnosis not present

## 2023-10-01 DIAGNOSIS — R110 Nausea: Secondary | ICD-10-CM | POA: Diagnosis not present

## 2023-10-01 DIAGNOSIS — R400 Somnolence: Secondary | ICD-10-CM | POA: Insufficient documentation

## 2023-10-01 MED ORDER — ONDANSETRON 4 MG PO TBDP
4.0000 mg | ORAL_TABLET | Freq: Four times a day (QID) | ORAL | 1 refills | Status: DC | PRN
Start: 2023-10-01 — End: 2023-10-07

## 2023-10-01 MED ORDER — PANTOPRAZOLE SODIUM 40 MG PO TBEC: 40.0000 mg | DELAYED_RELEASE_TABLET | Freq: Two times a day (BID) | ORAL | 3 refills | Status: AC | PRN

## 2023-10-01 MED ORDER — HYDRALAZINE HCL 10 MG PO TABS: 10.0000 mg | ORAL_TABLET | Freq: Three times a day (TID) | ORAL | 1 refills | Status: DC

## 2023-10-01 MED ORDER — OXYCODONE-ACETAMINOPHEN 5-325 MG PO TABS: 1.0000 | ORAL_TABLET | Freq: Four times a day (QID) | ORAL | 0 refills | Status: DC | PRN

## 2023-10-01 NOTE — Patient Instructions (Addendum)
 It was nice to see you today!  For your reflux- you may try the following. Anti-reflux measures such as raising the head of the bed, avoiding tight clothing or belts, avoiding eating late at night and not lying down shortly after mealtime and achieving weight loss. Avoid Aspirin , NSAID's such as ibuprofen  and aleve ), caffeine , peppermints, alcohol, and tobacco.   Please do not take tylenol  with the percocet.  If the pain persists or changes please go get checked out at the ER.  If you have any problems before your next visit feel free to message me via MyChart (minor issues or questions) or call the office, otherwise you may reach out to schedule an office visit.  Thank you! Jaylan Hinojosa, PA-C

## 2023-10-01 NOTE — Addendum Note (Signed)
 Addended by: MIRIAM JOSEPH on: 10/01/2023 04:34 PM   Modules accepted: Orders

## 2023-10-01 NOTE — Progress Notes (Signed)
 Established Patient Office Visit  Subjective   Patient ID: Krystal [name], female    DOB: 01/23/1991  Age: 33 y.o. MRN: [id number]  Chief Complaint  Patient presents with   Medical Management of Chronic Issues    Follow up. Was at ER on 09/07/2023. Is in so much pain. Is out of percocet. Tramadol  is not helping. Feels like she might have to go back to hospital.   Sleep apnea    Husband said she needed to do a sleep apnea test. Last time was 4 years ago. Snoring and waking up exhausted.     HPI  Discussed the use of AI scribe software for clinical note transcription with the patient, who gave verbal consent to proceed.  History of Present Illness   Krystal [name] is a 33 year old female who presents with pelvic and back pain.  She describes the pelvic and back pain as a 'sharp, pokey, sharp pain,' primarily localized to the bladder area rather than the back. She has a history of kidney stones but notes that this pain does not resemble her previous kidney stone episodes. She visited the ER on the 14th of last month for similar symptoms.  She experiences associated symptoms of nausea and vomiting and uses Zofran  for nausea, taking two to three tablets a day during flare-ups, with ten tablets currently remaining. She also reports regurgitation and a sensation of something being stuck in her throat, which has worsened over the past month, leading to coughing or a feeling of needing to vomit. She is on a regimen of Protonix , taking one in the morning and one at night, and has not seen a gastroenterologist recently.  She has a history of resistant hypertension and has previously been on hydralazine  and clonidine . Hydralazine  was used during her pregnancies, and she is familiar with it. She reports experiencing dizziness and balance issues when her blood pressure is too low.  She mentions a history of adrenal crisis, which causes concern when she experiences side pain. She has been  experiencing poor sleep, waking up exhausted, which she attributes to the pain. She has gained weight since her last sleep apnea test.        Patient Active Problem List   Diagnosis Date Noted   Daytime somnolence 10/01/2023   Chronic idiopathic constipation 09/04/2023   Bee allergy status 07/09/2023   BMI 36.0-36.9,adult 07/09/2023   Pelvic pain, chronic 07/04/2023   Anxiety and depression 07/04/2023   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/30/2023   Intractable nausea and vomiting 03/13/2023   Intractable vomiting with nausea 03/13/2023   Encounter for screening involving social determinants of health (SDoH) 10/07/2022   Chronic interstitial cystitis 05/05/2022   Chronic diarrhea 04/29/2022   Lower abdominal pain 04/28/2022   Adrenal insufficiency (HCC) 04/26/2022   Bradycardia 04/24/2022   Moderate persistent asthma with acute exacerbation 12/04/2021   Cervicalgia 10/31/2021   Borderline personality disorder in adult [facility]) 10/21/2021   Anisocoria 07/02/2021   History of excision of intestinal structure 05/23/2021   Attention deficit disorder 04/17/2021   Chronic fatigue syndrome 04/17/2021   Anosmia 04/17/2021   Chronic urticaria 01/15/2021   Local reaction to hymenoptera sting 01/15/2021   Perennial allergic rhinitis 01/15/2021   MDD (major depressive disorder), recurrent episode, severe (HCC) 11/19/2020   Fibromyalgia 05/11/2020   Gastroesophageal reflux disease 05/11/2020   Interstitial cystitis 05/11/2020   PTSD (post-traumatic stress disorder) 05/11/2020   Primary insomnia 05/11/2020   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   Hypothyroidism due to  Hashimoto's thyroiditis 10/03/2019   Anxiety 06/16/2019   Asthma 06/07/2019   Obesity 06/07/2019   Lumbar spondylosis 06/23/2018   Sacroiliac inflammation (HCC) 06/23/2018   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   History of upper limb amputation, wrist, 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   Past Medical History:  Diagnosis Date   Acute acalculous  cholecystitis s/p lap cholecystectomy 05/11/2020 05/11/2020   Angio-edema 01/15/2021   Anxiety    Asthma    Chronic interstitial cystitis    Depression    HELLP (hemolytic anemia/elev liver enzymes/low platelets in pregnancy) 12/08/2017   Hemorrhagic cyst of left ovary 04/15/2022   History of borderline personality disorder    History of HELLP syndrome, currently pregnant 04/21/2019   Formatting of this note might be different from the original.  History of HELLP syndrome at 26 weeks 5 days (12/07/2017); admitted to [facility] with BP 200/120s, Plt 52, AST/ALT 471/375  APS evaluation negative (04/2019)   Daily ASA 81 mg   Challenging blood pressure control in current pregnancy      Plan:  - Baseline pre-eclampsia labs this pregnancy within normal limits (Cr: 0.71; urine p:c 0.106)  -    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 03/05/2022   Hypothyroidism due to Hashimoto's thyroiditis    IC (interstitial cystitis)    Kidney stones    Migraines    Necrotizing fasciitis (HCC)    left hand amputation   PTSD (post-traumatic stress disorder)    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   Severe uncontrolled hypertension 10/08/2017   UTI (urinary tract infection)    Social History   Tobacco Use   Smoking status: Never    Passive exposure: Never   Smokeless tobacco: Never   Tobacco comments:    She tried a cigarette and never smoked again  Vaping Use   Vaping status: Every Day   Devices: DELTA- 8  Substance Use Topics   Alcohol use: Yes    Comment: bottle of wine a week-occ   Drug use: Yes    Comment: DELTA-8 occasionally   Allergies  Allergen Reactions   Bee Venom Anaphylaxis, Swelling and Other (See Comments)    Swelling of face, throat. Airway restriction.   Coreg  [Carvedilol ] Shortness Of Breath and Other (See Comments)    Triggers asthma   Red Dye #40 (Allura Red) Other (See Comments)    Migraines   Compazine  [Prochlorperazine  Maleate] Other (See Comments)     Twitching Able to tolerate promethazine    Vicodin [Hydrocodone -Acetaminophen ] Itching, Nausea And Vomiting and Other (See Comments)    OK with Tylenol    Cymbalta [Duloxetine Hcl] Other (See Comments)    Irritability  Syncope    Ditropan  [Oxybutynin ] Other (See Comments)    Severe dry mouth   Elavil [Amitriptyline] Other (See Comments)    Irritability    Motrin  [Ibuprofen ] Other (See Comments)    Heartburn    Naprosyn  [Naproxen ] Other (See Comments)    Heartburn    Pyrimethamine Other (See Comments)    From Fansidar combination drug Triggers asthma   Sulfadoxine Other (See Comments)    From Fansidar combination drug Triggers asthma    Zestril  [Lisinopril ] Cough   Nifedical Xl [Nifedipine ] Nausea And Vomiting   Reglan  [Metoclopramide ] Swelling and Other (See Comments)    Restlessness Able to take with Benadryl    Trandate  [Labetalol ] Nausea And Vomiting and Other (See Comments)    Triggers asthma       ROS Per HPI.    Objective:     BP (!) 158/107  Pulse 80   Temp 98.6 F (37 C) (Oral)   Resp 16   Ht 5' 11 (1.803 m)   Wt 267 lb 11.2 oz (121.4 kg)   LMP 09/28/2023 (Approximate)   SpO2 97%   BMI 37.34 kg/m  BP Readings from Last 3 Encounters:  10/01/23 (!) 158/107  09/07/23 (!) 169/105  09/03/23 (!) 158/106   Wt Readings from Last 3 Encounters:  10/01/23 267 lb 11.2 oz (121.4 kg)  09/07/23 266 lb (120.7 kg)  09/03/23 266 lb 8 oz (120.9 kg)      Physical Exam Constitutional:      Appearance: Normal appearance. She is not ill-appearing or toxic-appearing.     Comments: No major acute distress but does appear in pain.  HENT:     Head: Normocephalic and atraumatic.     Nose: Nose normal.  Eyes:     General: No scleral icterus.    Conjunctiva/sclera: Conjunctivae normal.  Cardiovascular:     Rate and Rhythm: Normal rate and regular rhythm.     Heart sounds: Normal heart sounds. No murmur heard.    No friction rub.  Pulmonary:     Effort: Pulmonary  effort is normal. No respiratory distress.     Breath sounds: Normal breath sounds. No wheezing, rhonchi or rales.  Abdominal:     General: There is no distension.     Palpations: There is no mass.     Tenderness: There is abdominal tenderness (Suprapubic, all else grossly normal). There is no guarding or rebound.     Hernia: No hernia is present.     Comments: Slight left-sided CVA tenderness.  Musculoskeletal:        General: Normal range of motion.  Skin:    General: Skin is warm and dry.     Coloration: Skin is not jaundiced or pale.  Neurological:     General: No focal deficit present.     Mental Status: She is alert.  Psychiatric:        Mood and Affect: Mood normal.        Behavior: Behavior normal.     Comments: Patient is tearful during examination      No results found for any visits on 10/01/23.  Last CBC Lab Results  Component Value Date   WBC 8.0 09/07/2023   HGB 14.5 09/07/2023   HCT 42.8 09/07/2023   MCV 94.1 09/07/2023   MCH 31.9 09/07/2023   RDW 13.3 09/07/2023   PLT 336 09/07/2023   Last metabolic panel Lab Results  Component Value Date   GLUCOSE 104 (H) 09/07/2023   NA 136 09/07/2023   K 3.8 09/07/2023   CL 100 09/07/2023   CO2 27 09/07/2023   BUN 17 09/07/2023   CREATININE 0.90 09/07/2023   GFRNONAA >60 09/07/2023   CALCIUM  9.6 09/07/2023   PHOS 3.9 04/22/2023   PROT 6.9 08/17/2023   ALBUMIN 4.1 08/17/2023   LABGLOB 2.4 07/09/2023   AGRATIO 1.9 08/14/2021   BILITOT 0.8 08/17/2023   ALKPHOS 53 08/17/2023   AST 25 08/17/2023   ALT 26 08/17/2023   ANIONGAP 9 09/07/2023   Last lipids Lab Results  Component Value Date   CHOL 185 07/09/2023   HDL 38 (L) 07/09/2023   LDLCALC 107 (H) 07/09/2023   TRIG 233 (H) 07/09/2023   CHOLHDL 4.9 (H) 07/09/2023   Last hemoglobin A1c Lab Results  Component Value Date   HGBA1C 5.2 07/09/2023      The ASCVD Risk score (Arnett  DK, et al., 2019) failed to calculate for the following reasons:    The 2019 ASCVD risk score is only valid for ages 34 to 44    Assessment & Plan:   Assessment and Plan    Chronic pelvic and bladder pain/ Interstitial Cystitis hx Chronic pelvic and bladder pain with sharp, pokey sensations, localized more to the bladder than the back. History of kidney stones, but current pain does not resemble past episodes. Recent ER visit for similar symptoms. Nausea and vomiting reported during flare-ups. - Order urinalysis/urine culture - Advise contacting urology for an earlier appointment if possible - Prescribe short supply of Percocet for acute pain management  Nausea with episodic vomiting Nausea with episodic vomiting, exacerbated during flare-ups of pelvic pain. Currently uses 2-3 Zofran  tablets daily during flare-ups. - Refill Zofran  with 30 tablets, disintegrating form preferred  Gastroesophageal reflux disease without esophagitis GERD with symptoms of regurgitation and sensation of something stuck in the throat. Symptoms have worsened over the last month, leading to coughing and vomiting. Currently on a high dose of Protonix  (twice daily). - Continue Protonix  twice daily - Refer to GI for further evaluation and possible endoscopy - Plan for urea breath test for H. pylori after pain subsides - Provide dietary advice for reflux management  Resistant hypertension Resistant hypertension with previous use of hydralazine  and clonidine . Clonidine  was discontinued due to low heart rate in the past, BB are unadvised currently. Hydralazine  was used during pregnancies and is familiar to her. Current regimen does not include spironolactone  due to concerns about potassium levels. Hydralazine  chosen due to familiarity and no contraindications noted. - Start hydralazine  at a low dose, three times per day - Instruct to monitor blood pressure in the morning and at night - Prescribe 90 tablets of hydralazine   Daytime somnolence Sleep apnea eval deferred until pain is  controlled as pain may interfere with sleep study results. Consideration for future sleep study if symptoms persist after pain management. - Address pain management before considering sleep study      Reviewed ER note from 09/07/23  Return in about 6 weeks (around 11/12/2023), or if symptoms worsen or fail to improve, for Resistant HTN- hydralazine  start.    Decarlo Rivet T Emmy Keng, PA-C

## 2023-10-03 LAB — URINE CULTURE

## 2023-10-05 ENCOUNTER — Ambulatory Visit (HOSPITAL_BASED_OUTPATIENT_CLINIC_OR_DEPARTMENT_OTHER): Payer: Self-pay | Admitting: Student

## 2023-10-05 LAB — SPECIMEN STATUS REPORT

## 2023-10-07 ENCOUNTER — Other Ambulatory Visit: Payer: Self-pay

## 2023-10-07 ENCOUNTER — Encounter (HOSPITAL_COMMUNITY): Payer: Self-pay

## 2023-10-07 ENCOUNTER — Emergency Department (HOSPITAL_COMMUNITY)
Admission: EM | Admit: 2023-10-07 | Discharge: 2023-10-07 | Disposition: A | Discharge status: Home / Self Care | Payer: MEDICAID | Attending: Emergency Medicine | Admitting: Emergency Medicine

## 2023-10-07 DIAGNOSIS — N3010 Interstitial cystitis (chronic) without hematuria: Secondary | ICD-10-CM | POA: Insufficient documentation

## 2023-10-07 DIAGNOSIS — R109 Unspecified abdominal pain: Secondary | ICD-10-CM | POA: Diagnosis present

## 2023-10-07 LAB — COMPREHENSIVE METABOLIC PANEL WITH GFR
ALT: 24 U/L (ref 0–44)
AST: 32 U/L (ref 15–41)
Albumin: 3.9 g/dL (ref 3.5–5.0)
Alkaline Phosphatase: 49 U/L (ref 38–126)
Anion gap: 11 (ref 5–15)
BUN: 23 mg/dL — ABNORMAL HIGH (ref 6–20)
CO2: 27 mmol/L (ref 22–32)
Calcium: 9.4 mg/dL (ref 8.9–10.3)
Chloride: 98 mmol/L (ref 98–111)
Creatinine, Ser: 1.13 mg/dL — ABNORMAL HIGH (ref 0.44–1.00)
GFR, Estimated: >60 mL/min (ref >60)
Glucose, Bld: 94 mg/dL (ref 70–99)
Potassium: 4 mmol/L (ref 3.5–5.1)
Sodium: 136 mmol/L (ref 135–145)
Total Bilirubin: 0.9 mg/dL (ref 0.0–1.2)
Total Protein: 6.9 g/dL (ref 6.5–8.1)

## 2023-10-07 LAB — CBC WITH DIFFERENTIAL/PLATELET
Abs Immature Granulocytes: 0.02 K/uL (ref 0.00–0.07)
Basophils Absolute: 0.1 K/uL (ref 0.0–0.1)
Basophils Relative: 1 %
Eosinophils Absolute: 0.1 K/uL (ref 0.0–0.5)
Eosinophils Relative: 2 %
HCT: 39.8 % (ref 36.0–46.0)
Hemoglobin: 13.4 g/dL (ref 12.0–15.0)
Immature Granulocytes: 0 %
Lymphocytes Relative: 33 %
Lymphs Abs: 2.1 K/uL (ref 0.7–4.0)
MCH: 31.5 pg (ref 26.0–34.0)
MCHC: 33.7 g/dL (ref 30.0–36.0)
MCV: 93.6 fL (ref 80.0–100.0)
Monocytes Absolute: 0.7 K/uL (ref 0.1–1.0)
Monocytes Relative: 11 %
Neutro Abs: 3.4 K/uL (ref 1.7–7.7)
Neutrophils Relative %: 53 %
Platelets: 307 K/uL (ref 150–400)
RBC: 4.25 MIL/uL (ref 3.87–5.11)
RDW: 13.5 % (ref 11.5–15.5)
WBC: 6.3 K/uL (ref 4.0–10.5)
nRBC: 0 % (ref 0.0–0.2)

## 2023-10-07 LAB — URINALYSIS, ROUTINE W REFLEX MICROSCOPIC
Bilirubin Urine: NEGATIVE
Glucose, UA: NEGATIVE mg/dL
Hgb urine dipstick: NEGATIVE
Ketones, ur: NEGATIVE mg/dL
Leukocytes,Ua: NEGATIVE
Nitrite: NEGATIVE
Protein, ur: NEGATIVE mg/dL
Specific Gravity, Urine: 1.027 (ref 1.005–1.030)
pH: 7 (ref 5.0–8.0)

## 2023-10-07 LAB — LIPASE, BLOOD: Lipase: 33 U/L (ref 11–51)

## 2023-10-07 LAB — WET PREP, GENITAL
Clue Cells Wet Prep HPF POC: NONE SEEN
Sperm: NONE SEEN
Trich, Wet Prep: NONE SEEN
WBC, Wet Prep HPF POC: <10 (ref <10)
Yeast Wet Prep HPF POC: NONE SEEN

## 2023-10-07 MED ORDER — HYDROMORPHONE HCL 1 MG/ML IJ SOLN
1.0000 mg | Freq: Once | INTRAMUSCULAR | Status: AC
  Administered 2023-10-07 (×2): 1 mg via INTRAMUSCULAR
  Filled 2023-10-07: qty 1

## 2023-10-07 MED ORDER — OXYCODONE-ACETAMINOPHEN 5-325 MG PO TABS: 1.0000 | ORAL_TABLET | ORAL | 0 refills | Status: DC | PRN

## 2023-10-07 MED ORDER — ONDANSETRON 4 MG PO TBDP: 4.0000 mg | ORAL_TABLET | Freq: Three times a day (TID) | ORAL | 0 refills | Status: DC | PRN

## 2023-10-07 MED ORDER — OXYCODONE-ACETAMINOPHEN 5-325 MG PO TABS
1.0000 | ORAL_TABLET | Freq: Once | ORAL | Status: AC
  Administered 2023-10-07 (×2): 1 via ORAL
  Filled 2023-10-07: qty 1

## 2023-10-07 MED ORDER — HYDRALAZINE HCL 10 MG PO TABS
10.0000 mg | ORAL_TABLET | Freq: Once | ORAL | Status: AC
Start: 2023-10-07 — End: 2023-10-07
  Administered 2023-10-07 (×2): 10 mg via ORAL
  Filled 2023-10-07: qty 1

## 2023-10-07 MED ORDER — ONDANSETRON 4 MG PO TBDP
4.0000 mg | ORAL_TABLET | Freq: Once | ORAL | Status: AC
  Administered 2023-10-07 (×2): 4 mg via ORAL
  Filled 2023-10-07: qty 1

## 2023-10-07 NOTE — Discharge Instructions (Addendum)
 Follow up with your Urologist for recheck.  Return if any problems.

## 2023-10-07 NOTE — ED Triage Notes (Signed)
 Pt presents to ED from home C/O bladder pain. Hx interstitial cystitis, normal home regimen is not working.

## 2023-10-07 NOTE — ED Notes (Signed)
 RN informed of BP readings

## 2023-10-07 NOTE — ED Provider Notes (Signed)
 [place] EMERGENCY DEPARTMENT AT [facility] Provider Note   CSN: [id number] Arrival date & time: 10/07/23  1525     Patient presents with: Abdominal Pain and Dysuria   Krystal [name] is a 33 y.o. female.   Pt complains of an exacerbation of interstitial cystitis.  Pt reports pain is worse than usual.  Pt denies any fever or chills.  Patient denies any fever or chills.  Patient denies any worsening abdominal pain.  Patient reports she has had nausea and vomiting.  She has had difficulty keeping her medications down.  Patient reports being unable to take her blood pressure medicines today due to nausea and vomiting.  Patient states that she sees Dr. Janit urology at [facility].  Patient states she has not been able to get an appointment.  The history is provided by the patient. No language interpreter was used.  Abdominal Pain Pain location:  Suprapubic Pain radiates to:  Does not radiate Pain severity:  Severe Duration:  2 weeks Timing:  Constant Progression:  Worsening Chronicity:  New Relieved by:  Nothing Worsened by:  Nothing Ineffective treatments:  None tried Associated symptoms: dysuria   Dysuria Progression:  Worsening Chronicity:  New Relieved by:  Nothing Worsened by:  Nothing Associated symptoms: abdominal pain        Prior to Admission medications   Medication Sig Start Date End Date Taking? Authorizing Provider  ondansetron  (ZOFRAN -ODT) 4 MG disintegrating tablet Take 1 tablet (4 mg total) by mouth every 8 (eight) hours as needed for nausea or vomiting. 10/07/23  Yes Almer Littleton K, PA-C  oxyCODONE -acetaminophen  (PERCOCET) 5-325 MG tablet Take 1 tablet by mouth every 4 (four) hours as needed for severe pain (pain score 7-10). 10/07/23 10/06/24 Yes Annalynne Ibanez K, PA-C  acetaminophen  (TYLENOL ) 500 MG tablet Take 1,000 mg by mouth 3 (three) times daily as needed (for pain or headaches).    [provider]  albuterol  (VENTOLIN  HFA) 108  (90 Base) MCG/ACT inhaler Inhale 2 puffs into the lungs every 4 (four) hours as needed for wheezing or shortness of breath. 10/15/22   Luke Orlan HERO, DO  BENADRYL  ALLERGY 25 MG capsule Take 25 mg by mouth every 6 (six) hours as needed for allergies or itching.    [provider]  bupivacaine ,PF, (MARCAINE ) 0.5 % SOLN injection Instill 15 mLs into bladder daily with heparin . 04/06/23     EPINEPHrine  0.3 mg/0.3 mL IJ SOAJ injection Inject 0.3 mg into the muscle as needed for anaphylaxis. 01/15/21   Luke Orlan HERO, DO  FLUoxetine  (PROZAC ) 20 MG capsule Take 3 capsules (60 mg total) by mouth daily. 07/22/23 10/01/23  Rothfuss, Jacob T, PA-C  heparin  10000 UNIT/ML injection Instill 4 mLs (40,000 Units total) into the bladder daily with bupivacaine . 04/06/23     hydrALAZINE  (APRESOLINE ) 10 MG tablet Take 1 tablet (10 mg total) by mouth 3 (three) times daily. 10/01/23   Rothfuss, Jacob T, PA-C  hydrocortisone  (CORTEF ) 10 MG tablet Take 10 mg by mouth. 10mg  in the AM & 5 in PM    [provider]  hydrOXYzine  (ATARAX ) 25 MG tablet Take 50 mg by mouth at bedtime.    [provider]  irbesartan -hydrochlorothiazide  (AVALIDE) 150-12.5 MG tablet Take 1 tablet by mouth in the morning and at bedtime. 09/03/23 10/03/23  Rothfuss, Jacob T, PA-C  levothyroxine  (SYNTHROID ) 150 MCG tablet Take 1 tablet (150 mcg total) by mouth daily at 6 (six) AM. 10/06/22 10/06/23  Elnora Ip, MD  linaclotide  (LINZESS ) 145 MCG CAPS capsule Take 1 capsule (145 mcg total) by mouth daily before breakfast. 09/03/23   Rothfuss, Jacob T, PA-C  meloxicam (MOBIC) 7.5 MG tablet Take 7.5-15 mg by mouth daily as needed. 09/20/23   [provider]  NON FORMULARY Take 1 capsule by mouth See admin instructions. Beef organ capsules- Take 1 capsule by mouth once a day    [provider]  OLANZapine  (ZYPREXA ) 10 MG tablet Take 10 mg by mouth at bedtime.    [provider]  OLANZapine  (ZYPREXA ) 2.5 MG tablet  Take 2.5 mg by mouth daily.    [provider]  pantoprazole  (PROTONIX ) 40 MG tablet Take 1 tablet (40 mg total) by mouth 2 (two) times daily as needed (for heartburn). 10/01/23   Rothfuss, Jacob T, PA-C  RASPBERRY PO Take 2 capsules by mouth See admin instructions. Raspberry Leaf capsules- Take 2 capsules by mouth once a day    [provider]  Syringe, Disposable, 20 ML MISC Use as directed for daily bladder instillations 04/06/23     traMADol  (ULTRAM ) 50 MG tablet Take 1 tablet (50 mg total) by mouth every 12 (twelve) hours as needed for moderate pain (pain score 4-6). 04/27/23   Ezenduka, Nkeiruka J, MD    Allergies: Bee venom, Coreg  [carvedilol ], Red dye #40 (allura red), Compazine  [prochlorperazine  maleate], Vicodin [hydrocodone -acetaminophen ], Cymbalta [duloxetine hcl], Ditropan  [oxybutynin ], Elavil [amitriptyline], Motrin  [ibuprofen ], Naprosyn  [naproxen ], Pyrimethamine, Sulfadoxine, Zestril  [lisinopril ], Nifedical xl [nifedipine ], Reglan  [metoclopramide ], and Trandate  [labetalol ]    Review of Systems  Gastrointestinal:  Positive for abdominal pain.  Genitourinary:  Positive for dysuria.  All other systems reviewed and are negative.   Updated Vital Signs BP (!) 154/101 (BP Location: Right Arm)   Pulse 63   Temp 98 F (36.7 C)   Resp 16   Ht 5' 11 (1.803 m)   Wt 120.2 kg   LMP 09/28/2023 (Approximate)   SpO2 96%   BMI 36.96 kg/m   Physical Exam Vitals and nursing note reviewed.  Constitutional:      Appearance: She is well-developed.  HENT:     Head: Normocephalic.  Cardiovascular:     Rate and Rhythm: Normal rate and regular rhythm.  Pulmonary:     Effort: Pulmonary effort is normal.  Abdominal:     General: Abdomen is flat. There is no distension.     Palpations: Abdomen is soft.     Tenderness: There is abdominal tenderness.  Musculoskeletal:        General: Normal range of motion.     Cervical back: Normal range of motion.  Skin:    General: Skin  is warm.  Neurological:     General: No focal deficit present.     Mental Status: She is alert and oriented to person, place, and time.     (all labs ordered are listed, but only abnormal results are displayed) Labs Reviewed  COMPREHENSIVE METABOLIC PANEL WITH GFR - Abnormal; Notable for the following components:      Result Value   BUN 23 (*)    Creatinine, Ser 1.13 (*)    All other components within normal limits  WET PREP, GENITAL  CBC WITH DIFFERENTIAL/PLATELET  LIPASE, BLOOD  URINALYSIS, ROUTINE W REFLEX MICROSCOPIC    EKG: None  Radiology: No results found.   Procedures   Medications Ordered in the ED  HYDROmorphone  (DILAUDID ) injection 1 mg (1 mg Intramuscular Given 10/07/23 2028)  ondansetron  (ZOFRAN -ODT) disintegrating tablet 4 mg (4 mg Oral  Given 10/07/23 2026)  hydrALAZINE  (APRESOLINE ) tablet 10 mg (10 mg Oral Given 10/07/23 2213)  oxyCODONE -acetaminophen  (PERCOCET/ROXICET) 5-325 MG per tablet 1 tablet (1 tablet Oral Given 10/07/23 2215)                                    Medical Decision Making Patient complains of interstitial cystitis exacerbation.  Patient reports that none of her current treatment regimens are helping patient is requesting medication for pain.  Amount and/or Complexity of Data Reviewed Independent Historian: parent Labs: ordered. Decision-making details documented in ED Course.    Details: Labs ordered reviewed and interpreted UA is negative.  Wet prep is negative.  Risk Prescription drug management. Risk Details: Scheduled to see Dr. Janit for further evaluation.  Return if symptoms worsen or change.  Take your blood pressure medication as directed.        Final diagnoses:  Pain due to interstitial cystitis    ED Discharge Orders          Ordered    oxyCODONE -acetaminophen  (PERCOCET) 5-325 MG tablet  Every 4 hours PRN        10/07/23 2245    ondansetron  (ZOFRAN -ODT) 4 MG disintegrating tablet  Every 8 hours PRN         10/07/23 2245            An After Visit Summary was printed and given to the patient.    Flint Sonny POUR, PA-C 10/07/23 2256    Geraldene Hamilton, MD 10/08/23 [phone]

## 2023-10-07 NOTE — ED Provider Triage Note (Signed)
 Emergency Medicine Provider Triage Evaluation Note  Krystal [name] , a 33 y.o. female  was evaluated in triage.  Pt complains of bladder pain that has flared up over the past 3 days.  States this feels consistent with her to her interstitial cystitis pain.  Has not been well-controlled with home medications such as Percocet and tramadol , prompting her evaluation here.  Does report some nausea and vomiting.  States that whenever she eats will come back up.  Denies any fever or chills.  Does report increased brownish vaginal discharge.  Review of Systems  Positive: As above Negative: As above  Physical Exam  BP (!) 172/105 (BP Location: Right Arm)   Pulse 81   Temp 98.2 F (36.8 C) (Oral)   Resp 17   Ht 5' 11 (1.803 m)   Wt 120.2 kg   LMP 09/28/2023 (Approximate)   SpO2 99%   BMI 36.96 kg/m  Gen:   Awake, crying, holding lower abdomen Resp:  Normal effort  MSK:   Moves extremities without difficulty    Medical Decision Making  Medically screening exam initiated at 3:42 PM.  Appropriate orders placed.  Krystal [name] was informed that the remainder of the evaluation will be completed by another provider, this initial triage assessment does not replace that evaluation, and the importance of remaining in the ED until their evaluation is complete.     Krystal Palma, PA-C 10/07/23 1542

## 2023-10-08 ENCOUNTER — Encounter (HOSPITAL_BASED_OUTPATIENT_CLINIC_OR_DEPARTMENT_OTHER): Payer: Self-pay

## 2023-10-09 ENCOUNTER — Encounter (HOSPITAL_COMMUNITY): Payer: Self-pay

## 2023-10-09 ENCOUNTER — Other Ambulatory Visit: Payer: Self-pay

## 2023-10-09 ENCOUNTER — Ambulatory Visit: Payer: Self-pay

## 2023-10-09 ENCOUNTER — Emergency Department (HOSPITAL_COMMUNITY)
Admission: EM | Admit: 2023-10-09 | Discharge: 2023-10-09 | Disposition: A | Discharge status: Home / Self Care | Payer: MEDICAID | Attending: Emergency Medicine | Admitting: Emergency Medicine

## 2023-10-09 DIAGNOSIS — R109 Unspecified abdominal pain: Secondary | ICD-10-CM

## 2023-10-09 DIAGNOSIS — R112 Nausea with vomiting, unspecified: Secondary | ICD-10-CM | POA: Diagnosis not present

## 2023-10-09 DIAGNOSIS — R1030 Lower abdominal pain, unspecified: Secondary | ICD-10-CM | POA: Diagnosis present

## 2023-10-09 DIAGNOSIS — I10 Essential (primary) hypertension: Secondary | ICD-10-CM | POA: Insufficient documentation

## 2023-10-09 LAB — CBC
HCT: 43.4 % (ref 36.0–46.0)
Hemoglobin: 14.8 g/dL (ref 12.0–15.0)
MCH: 31.5 pg (ref 26.0–34.0)
MCHC: 34.1 g/dL (ref 30.0–36.0)
MCV: 92.3 fL (ref 80.0–100.0)
Platelets: 318 K/uL (ref 150–400)
RBC: 4.7 MIL/uL (ref 3.87–5.11)
RDW: 13.2 % (ref 11.5–15.5)
WBC: 6.3 K/uL (ref 4.0–10.5)
nRBC: 0 % (ref 0.0–0.2)

## 2023-10-09 LAB — URINALYSIS, ROUTINE W REFLEX MICROSCOPIC
Bilirubin Urine: NEGATIVE
Glucose, UA: NEGATIVE mg/dL
Hgb urine dipstick: NEGATIVE
Ketones, ur: NEGATIVE mg/dL
Leukocytes,Ua: NEGATIVE
Nitrite: NEGATIVE
Protein, ur: NEGATIVE mg/dL
Specific Gravity, Urine: 1.017 (ref 1.005–1.030)
pH: 7 (ref 5.0–8.0)

## 2023-10-09 LAB — COMPREHENSIVE METABOLIC PANEL WITH GFR
ALT: 27 U/L (ref 0–44)
AST: 24 U/L (ref 15–41)
Albumin: 4.3 g/dL (ref 3.5–5.0)
Alkaline Phosphatase: 48 U/L (ref 38–126)
Anion gap: 13 (ref 5–15)
BUN: 19 mg/dL (ref 6–20)
CO2: 26 mmol/L (ref 22–32)
Calcium: 10.2 mg/dL (ref 8.9–10.3)
Chloride: 100 mmol/L (ref 98–111)
Creatinine, Ser: 0.88 mg/dL (ref 0.44–1.00)
GFR, Estimated: >60 mL/min (ref >60)
Glucose, Bld: 96 mg/dL (ref 70–99)
Potassium: 4 mmol/L (ref 3.5–5.1)
Sodium: 139 mmol/L (ref 135–145)
Total Bilirubin: 0.7 mg/dL (ref 0.0–1.2)
Total Protein: 7.6 g/dL (ref 6.5–8.1)

## 2023-10-09 LAB — CORTISOL: Cortisol, Plasma: 2.3 ug/dL

## 2023-10-09 LAB — LIPASE, BLOOD: Lipase: 29 U/L (ref 11–51)

## 2023-10-09 LAB — HCG, SERUM, QUALITATIVE: Preg, Serum: NEGATIVE

## 2023-10-09 MED ORDER — HYDROMORPHONE HCL 1 MG/ML IJ SOLN: 1.0000 mg | Freq: Once | INTRAMUSCULAR | Status: DC

## 2023-10-09 MED ORDER — HYDROMORPHONE HCL 1 MG/ML IJ SOLN
1.0000 mg | Freq: Once | INTRAMUSCULAR | Status: AC
  Administered 2023-10-09: 1 mg via INTRAVENOUS
  Filled 2023-10-09: qty 1

## 2023-10-09 MED ORDER — MORPHINE SULFATE (PF) 4 MG/ML IV SOLN
4.0000 mg | Freq: Once | INTRAVENOUS | Status: AC
  Administered 2023-10-09: 4 mg via INTRAVENOUS
  Filled 2023-10-09: qty 1

## 2023-10-09 MED ORDER — HYDROCORTISONE SOD SUC (PF) 100 MG IJ SOLR
100.0000 mg | Freq: Once | INTRAMUSCULAR | Status: AC
  Administered 2023-10-09: 100 mg via INTRAVENOUS
  Filled 2023-10-09: qty 2

## 2023-10-09 MED ORDER — HYDRALAZINE HCL 20 MG/ML IJ SOLN
10.0000 mg | Freq: Once | INTRAMUSCULAR | Status: AC
  Administered 2023-10-09: 10 mg via INTRAVENOUS
  Filled 2023-10-09: qty 1

## 2023-10-09 MED ORDER — ONDANSETRON HCL 4 MG/2ML IJ SOLN
4.0000 mg | Freq: Once | INTRAMUSCULAR | Status: AC
  Administered 2023-10-09: 4 mg via INTRAVENOUS
  Filled 2023-10-09: qty 2

## 2023-10-09 MED ORDER — ONDANSETRON 4 MG PO TBDP: 4.0000 mg | ORAL_TABLET | Freq: Once | ORAL | Status: DC | PRN

## 2023-10-09 MED ORDER — LACTATED RINGERS IV BOLUS
1000.0000 mL | Freq: Once | INTRAVENOUS | Status: AC
  Administered 2023-10-09: 1000 mL via INTRAVENOUS

## 2023-10-09 MED ORDER — HALOPERIDOL LACTATE 5 MG/ML IJ SOLN
2.0000 mg | Freq: Once | INTRAMUSCULAR | Status: DC
  Filled 2023-10-09: qty 1

## 2023-10-09 NOTE — Discharge Instructions (Signed)
 Continue your current medications.  Follow-up with your urologist to be rechecked.

## 2023-10-09 NOTE — ED Triage Notes (Signed)
 Pt presents to ED from home C/O nausea and vomiting X 4 days. Seen yesterday for bladder pain, n/v. Pt states PCP sent her to get her cortisol checked.

## 2023-10-09 NOTE — ED Provider Notes (Addendum)
 [place] EMERGENCY DEPARTMENT AT [facility] Provider Note   CSN: [id number] Arrival date & time: 10/09/23  1041     Patient presents with: Emesis   Krystal [name] is a 33 y.o. female.    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  Patient started having symptoms acutely over a week ago.  Patient has sharp pain in her lower abdomen.  She has had multiple episodes of nausea and vomiting.  She has not been able to keep any medications down.  Patient also has had urinary discomfort.  She had symptoms for over a week.  She saw her doctor on the seventh.  At that time patient had run out of her Percocet.  Patient was given a short-term prescription of Percocet and she was instructed to follow-up with her urologist.  Patient was in the ED on the 13th for the same symptoms.  She was treated with medications and eventually released.  Patient states she continues to have the same symptoms.  She called her doctor and was told to come to the ED and also to get her cortisol levels checked because she has not been able to take her meds.  Prior to Admission medications   Medication Sig Start Date End Date Taking? Authorizing Provider  acetaminophen  (TYLENOL ) 500 MG tablet Take 1,000 mg by mouth 3 (three) times daily as needed (for pain or headaches).    [provider]  albuterol  (VENTOLIN  HFA) 108 (90 Base) MCG/ACT inhaler Inhale 2 puffs into the lungs every 4 (four) hours as needed for wheezing or shortness of breath. 10/15/22   Luke Orlan HERO, DO  BENADRYL  ALLERGY 25 MG capsule Take 25 mg by mouth every 6 (six) hours as needed for allergies or itching.    [provider]  bupivacaine ,PF, (MARCAINE ) 0.5 % SOLN injection Instill 15 mLs into bladder daily with heparin . 04/06/23     EPINEPHrine  0.3 mg/0.3 mL IJ SOAJ injection Inject 0.3 mg into the muscle as needed for anaphylaxis. 01/15/21   Luke Orlan HERO, DO  FLUoxetine  (PROZAC ) 20 MG capsule Take 3 capsules (60 mg total) by mouth daily. 07/22/23 10/01/23  Rothfuss, Jacob T, PA-C  heparin  10000 UNIT/ML injection Instill 4 mLs (40,000 Units total) into the bladder daily with bupivacaine . 04/06/23     hydrALAZINE  (APRESOLINE ) 10 MG tablet Take 1 tablet (10 mg total) by mouth 3 (three) times daily. 10/01/23   Rothfuss, Jacob T, PA-C  hydrocortisone  (CORTEF ) 10 MG tablet Take 10 mg by mouth. 10mg  in the AM & 5 in PM    [provider]  hydrOXYzine  (ATARAX ) 25 MG tablet Take 50 mg by mouth at bedtime.    [provider]  irbesartan -hydrochlorothiazide  (AVALIDE) 150-12.5 MG tablet Take 1 tablet by mouth in the morning and at bedtime. 09/03/23 10/03/23  Rothfuss, Jacob T, PA-C  levothyroxine  (SYNTHROID ) 150 MCG tablet Take 1 tablet (150 mcg total) by mouth daily at 6 (six) AM. 10/06/22 10/06/23  Elnora Ip, MD  linaclotide  (LINZESS ) 145 MCG CAPS capsule Take 1 capsule (145 mcg total) by mouth daily before breakfast. 09/03/23   Rothfuss, Jacob T, PA-C  meloxicam (MOBIC) 7.5 MG tablet Take 7.5-15 mg by mouth daily as needed. 09/20/23  [provider]  NON FORMULARY Take 1 capsule by mouth See admin instructions. Beef organ capsules- Take 1 capsule by mouth once a day    [provider]  OLANZapine  (ZYPREXA ) 10 MG tablet Take 10 mg by mouth at bedtime.    [provider]  OLANZapine  (ZYPREXA ) 2.5 MG tablet Take 2.5 mg by mouth daily.    [provider]  ondansetron  (ZOFRAN -ODT) 4 MG disintegrating tablet Take 1 tablet (4 mg total) by mouth every 8 (eight) hours as needed for nausea or vomiting. 10/07/23   Sofia, Leslie K, PA-C  oxyCODONE -acetaminophen  (PERCOCET)  5-325 MG tablet Take 1 tablet by mouth every 4 (four) hours as needed for severe pain (pain score 7-10). 10/07/23 10/06/24  Flint Sonny POUR, PA-C  pantoprazole  (PROTONIX ) 40 MG tablet Take 1 tablet (40 mg total) by mouth 2 (two) times daily as needed (for heartburn). 10/01/23   Rothfuss, Jacob T, PA-C  RASPBERRY PO Take 2 capsules by mouth See admin instructions. Raspberry Leaf capsules- Take 2 capsules by mouth once a day    [provider]  Syringe, Disposable, 20 ML MISC Use as directed for daily bladder instillations 04/06/23     traMADol  (ULTRAM ) 50 MG tablet Take 1 tablet (50 mg total) by mouth every 12 (twelve) hours as needed for moderate pain (pain score 4-6). 04/27/23   Ezenduka, Nkeiruka J, MD    Allergies: Bee venom, Coreg  [carvedilol ], Red dye #40 (allura red), Compazine  [prochlorperazine  maleate], Vicodin [hydrocodone -acetaminophen ], Cymbalta [duloxetine hcl], Ditropan  [oxybutynin ], Elavil [amitriptyline], Motrin  [ibuprofen ], Naprosyn  [naproxen ], Pyrimethamine, Sulfadoxine, Zestril  [lisinopril ], Nifedical xl [nifedipine ], Reglan  [metoclopramide ], and Trandate  [labetalol ]    Review of Systems  Gastrointestinal:  Positive for vomiting.    Updated Vital Signs BP (!) 169/99 (BP Location: Left Arm)   Pulse 66   Temp 98.2 F (36.8 C) (Oral)   Resp 12   Ht 1.803 m (5' 11)   Wt 120.2 kg   LMP 09/28/2023 (Approximate)   SpO2 96%   BMI 36.96 kg/m   Physical Exam Vitals and nursing note reviewed.  Constitutional:      Appearance: She is well-developed. She is ill-appearing.  HENT:     Head: Normocephalic and atraumatic.     Right Ear: External ear normal.     Left Ear: External ear normal.  Eyes:     General: No scleral icterus.       Right eye: No discharge.        Left eye: No discharge.     Conjunctiva/sclera: Conjunctivae normal.  Neck:     Trachea: No tracheal deviation.  Cardiovascular:     Rate and Rhythm: Normal rate and regular rhythm.  Pulmonary:      Effort: Pulmonary effort is normal. No respiratory distress.     Breath sounds: Normal breath sounds. No stridor. No wheezing or rales.  Abdominal:     General: Bowel sounds are normal. There is no distension.     Palpations: Abdomen is soft.     Tenderness: There is abdominal tenderness. There is no guarding or rebound.     Comments: Tenderness palpation suprapubic region, no rebound or guarding  Musculoskeletal:        General: No tenderness or deformity.     Cervical back: Neck supple.     Comments: Status post amputation to left upper extremity  Skin:    General: Skin is warm and dry.     Findings: No rash.  Neurological:     General: No  focal deficit present.     Mental Status: She is alert.     Cranial Nerves: No cranial nerve deficit, dysarthria or facial asymmetry.     Sensory: No sensory deficit.     Motor: No abnormal muscle tone or seizure activity.     Coordination: Coordination normal.     (all labs ordered are listed, but only abnormal results are displayed) Labs Reviewed  LIPASE, BLOOD  COMPREHENSIVE METABOLIC PANEL WITH GFR  CBC  URINALYSIS, ROUTINE W REFLEX MICROSCOPIC  HCG, SERUM, QUALITATIVE  CORTISOL    EKG: EKG Interpretation Date/Time:  Friday October 09 2023 11:37:18 EDT Ventricular Rate:  68 PR Interval:  139 QRS Duration:  117 QT Interval:  410 QTC Calculation: 436 R Axis:   8  Text Interpretation: Sinus rhythm Nonspecific intraventricular conduction delay No significant change since last tracing Confirmed by Randol Simmonds [id number]) on 10/09/2023 11:46:00 AM  Radiology: No results found.   Procedures   Medications Ordered in the ED  haloperidol  lactate (HALDOL ) injection 2 mg (2 mg Intravenous Patient Refused/Not Given 10/09/23 1610)  lactated ringers  bolus 1,000 mL (1,000 mLs Intravenous New Bag/Given 10/09/23 1229)  ondansetron  (ZOFRAN ) injection 4 mg (4 mg Intravenous Given 10/09/23 1241)  HYDROmorphone  (DILAUDID ) injection 1 mg (1 mg  Intravenous Given 10/09/23 1241)  hydrocortisone  sodium succinate  (SOLU-CORTEF ) 100 MG injection 100 mg (100 mg Intravenous Given 10/09/23 1241)  hydrALAZINE  (APRESOLINE ) injection 10 mg (10 mg Intravenous Given 10/09/23 1437)  morphine  (PF) 4 MG/ML injection 4 mg (4 mg Intravenous Given 10/09/23 1508)  ondansetron  (ZOFRAN ) injection 4 mg (4 mg Intravenous Given 10/09/23 1510)  morphine  (PF) 4 MG/ML injection 4 mg (4 mg Intravenous Given 10/09/23 1621)    Clinical Course as of 10/09/23 1636  Fri Oct 09, 2023  1123 Narx score 560.  Controlled rx 8/7 [JK]  1420 CBC normal.  Metabolic panel normal.  Lipase normal.  Urinalysis negative.  hCG negative [JK]    Clinical Course User Index [JK] Randol Simmonds, MD                                 Medical Decision Making Problems Addressed: Abdominal pain, unspecified abdominal location: acute illness or injury that poses a threat to life or bodily functions  Amount and/or Complexity of Data Reviewed Labs: ordered. Decision-making details documented in ED Course.  Risk Prescription drug management. Parenteral controlled substances.   Patient has history of recurrent episodes of abdominal pain.  Patient has had 12 ED visits in the last 6 months.  Patient does have history of interstitial cystitis.  Considered the possibility of pyelonephritis urinary tract infection low suspicion for appendicitis.  Doubt ovarian cyst or torsion.  Patient symptoms were very similar to previous episodes.  Her laboratory test unremarkable.  No leukocytosis.  Urinalysis not showing blood or infection.  Patient has had numerous CAT scans in the past so did not repeat that today.  She was treated with IV pain medications.  Patient also noted to be hypertensive because she had not been taking her medications and her blood pressure improved with treatment and pain management.  Symptoms have improved.  Encouraged her to follow-up with her urologist. Evaluation and diagnostic testing  in the emergency department does not suggest an emergent condition requiring admission or immediate intervention beyond what has been performed at this time.  The patient is safe for discharge and has been instructed to return immediately for  worsening symptoms, change in symptoms or any other concerns.    Final diagnoses:  Abdominal pain, unspecified abdominal location    ED Discharge Orders     None          Randol Simmonds, MD 10/09/23 1638    Randol Simmonds, MD 10/09/23 [phone]

## 2023-10-09 NOTE — Telephone Encounter (Signed)
 FYI Only or Action Required?: FYI only for provider.  Patient was last seen in primary care on 03/25/2023 by Celestia Rosaline SQUIBB, NP.  Called Nurse Triage reporting No chief complaint on file..  Symptoms began yesterday.  Interventions attempted: Nothing.  Symptoms are: gradually worsening.  Triage Disposition: Go to ED or PCP/Alternative with Approval  Patient/caregiver understands and will follow disposition?: Yes     Copied from CRM #[id number]. Topic: Clinical - Red Word Triage >> Oct 09, 2023  9:25 AM Jasmin G wrote: Red Word that prompted transfer to Nurse Triage: Pt feels like she has the flu without the sinus issues, she's experiencing body aches, nausea, vomiting and a lot of pain on both sides of her back. Reason for Disposition  Patient sounds very sick or weak to the triager  Answer Assessment - Initial Assessment Questions 1. WORST SYMPTOM: What is your worst symptom? (e.g., cough, runny nose, muscle aches, headache, sore throat, fever)      Muscle aches and back and bladder pain 2. ONSET: When did your flu symptoms start?      yesterday 3. COUGH: How bad is the cough?       denies 4. RESPIRATORY DISTRESS: Describe your breathing.      denies 5. FEVER: Do you have a fever? If Yes, ask: What is your temperature, how was it measured, and when did it start?     Yes, yesterday went away after taking tylenol  6. EXPOSURE: Were you exposed to someone with influenza?       no 7. FLU VACCINE: Did you get a flu shot this year?     Last night 8. HIGH RISK DISEASE: Do you have any chronic medical problems? (e.g., heart or lung disease, asthma, weak immune system, or other HIGH RISK conditions)     Asthma, adrenal insufficiency  9. PREGNANCY: Is there any chance you are pregnant? When was your last menstrual period?     na 10. OTHER SYMPTOMS: Do you have any other symptoms?  (e.g., runny nose, muscle aches, headache, sore throat)       N/v  Protocols  used: Influenza (Flu) - [facility]

## 2023-11-02 ENCOUNTER — Ambulatory Visit: Payer: Self-pay

## 2023-11-02 NOTE — Telephone Encounter (Signed)
 FYI Only or Action Required?: FYI only for provider.  Patient was last seen in primary care on 10/01/2023 by Rothfuss, Lang DASEN, PA-C.  Called Nurse Triage reporting Urinary Frequency.  Symptoms began a week ago.  Interventions attempted: Prescription medications: ABX.  Symptoms are: gradually worsening.  Triage Disposition: See Physician Within 24 Hours  Patient/caregiver understands and will follow disposition?: yes Pt given acute appt since sx are worsening. If pt needs to be seen sooner please call and advise.       Copied from CRM [id number]. Topic: Clinical - Red Word Triage >> Nov 02, 2023 10:16 AM Krystal [name] wrote: Red Word that prompted transfer to Nurse Triage: Possible uti. Pt is reporting low bp 116/82, frequent urination, fatigues, nausea/vomiting, and light headed. Reason for Disposition  All other patients with painful urination  (Exception: [1] EITHER frequency or urgency AND [2] has on-call doctor.)  Answer Assessment - Initial Assessment Questions 1. SEVERITY: How bad is the pain?  (e.g., Scale 1-10; mild, moderate, or severe)     6-8   3. PATTERN: Is pain present every time you urinate or just sometimes?      Every time  4. ONSET: When did the painful urination start?      Last week put on abx  5. FEVER: Do you have a fever? If Yes, ask: What is your temperature, how was it measured, and when did it start?     No fever but having chiils   7. CAUSE: What do you think is causing the painful urination?  (e.g., UTI, scratch, Herpes sore)     UTI  8. OTHER SYMPTOMS: Do you have any other symptoms? (e.g., blood in urine, flank pain, genital sores, urgency, vaginal discharge)     Frequent urination overall feels worse says BP low 116/82 (160/110, lightheadedness: random  Protocols used: Urination Pain - Female-A-AH

## 2023-11-03 ENCOUNTER — Ambulatory Visit (INDEPENDENT_AMBULATORY_CARE_PROVIDER_SITE_OTHER): Payer: MEDICAID | Admitting: Student

## 2023-11-03 ENCOUNTER — Encounter (HOSPITAL_BASED_OUTPATIENT_CLINIC_OR_DEPARTMENT_OTHER): Payer: Self-pay | Admitting: Student

## 2023-11-03 ENCOUNTER — Ambulatory Visit (INDEPENDENT_AMBULATORY_CARE_PROVIDER_SITE_OTHER)
Admission: RE | Admit: 2023-11-03 | Discharge: 2023-11-03 | Disposition: A | Discharge status: Home / Self Care | Payer: MEDICAID | Source: Ambulatory Visit | Attending: Student | Admitting: Student

## 2023-11-03 ENCOUNTER — Ambulatory Visit (HOSPITAL_BASED_OUTPATIENT_CLINIC_OR_DEPARTMENT_OTHER): Payer: Self-pay | Admitting: Student

## 2023-11-03 VITALS — BP 137/95 | HR 80 | Temp 98.6°F | Resp 16 | Ht 71.0 in | Wt 251.9 lb

## 2023-11-03 DIAGNOSIS — R0602 Shortness of breath: Secondary | ICD-10-CM | POA: Diagnosis not present

## 2023-11-03 DIAGNOSIS — R300 Dysuria: Secondary | ICD-10-CM | POA: Diagnosis not present

## 2023-11-03 DIAGNOSIS — N3010 Interstitial cystitis (chronic) without hematuria: Secondary | ICD-10-CM | POA: Diagnosis not present

## 2023-11-03 DIAGNOSIS — R42 Dizziness and giddiness: Secondary | ICD-10-CM | POA: Diagnosis not present

## 2023-11-03 LAB — POCT URINALYSIS DIP (CLINITEK)
Bilirubin, UA: NEGATIVE — AB
Blood, UA: NEGATIVE
Glucose, UA: NEGATIVE mg/dL
Ketones, POC UA: NEGATIVE mg/dL
Leukocytes, UA: NEGATIVE
Nitrite, UA: NEGATIVE
POC PROTEIN,UA: NEGATIVE
Spec Grav, UA: 1.015 (ref 1.010–1.025)
Urobilinogen, UA: 0.2 U/dL
pH, UA: 7 (ref 5.0–8.0)

## 2023-11-03 MED ORDER — PREGABALIN 25 MG PO CAPS: 25.0000 mg | ORAL_CAPSULE | Freq: Two times a day (BID) | ORAL | 2 refills | Status: AC

## 2023-11-03 NOTE — Patient Instructions (Addendum)
 It was nice to see you today!  As we discussed in clinic:  You can discuss starting cymbalta or effexor  with your psych doctor.  If you begin getting any more shortness of breath that is accompanied by nausea, chest pain, or sweating then please go get checked out at the ER.  If your blood pressure is running too low at home, please let me know as we may decrease a blood pressure medication.  If you have any problems before your next visit feel free to message me via MyChart (minor issues or questions) or call the office, otherwise you may reach out to schedule an office visit.  Thank you! Sanaiyah Kirchhoff, PA-C

## 2023-11-03 NOTE — Progress Notes (Signed)
 Established Patient Office Visit  Subjective   Patient ID: Krystal [name], female    DOB: 1990/07/07  Age: 33 y.o. MRN: [id number]  Chief Complaint  Patient presents with   Urinary Tract Infection    Having urinary frequency and burning. Went to [facility] ER want sent her  NITROFURANTOIN MONO 100MG  CAP twice a day for 5 days due to low bacteria. Groin area is hurting. Having nausea and vomiting. No x-rays or imaging was done. No fever. Having freezing and chills but thought it was thyroid . Uro cannot see her until 11/12/2023. Would like a pain management referral. Psych meds changed meds. Depression gets worse when main is worse.    Dizziness    Been feeling light headed since Saturday. BP is lower than normal @ low 120/80.   Discussed the use of AI scribe software for clinical note transcription with the patient, who gave verbal consent to proceed.  History of Present Illness   Krystal [name] is a 33 year old female with interstitial cystitis who presents with urinary pain and dizziness.  She experiences significant urinary pain, described as 'unbearable,' which has persisted despite undergoing bladder retraining. A recent diagnosis of a urinary tract infection (UTI) at [facility] led to a prescription of nitrofurantoin, but there has been no improvement in symptoms. A urine dipstick test was negative, and she is awaiting further evaluation by urology.  She describes episodes of dizziness characterized by lightheadedness and a sensation of the room spinning. These episodes occur regardless of activity, including while driving, necessitating her to pull over until the dizziness subsides. Standing up quickly exacerbates the dizziness, causing a sensation of potential falling. She associates these symptoms with lower than usual blood pressure readings, as low as 112/74 mmHg. She also experiences shortness of breath, which occurred yesterday and this morning, without specific  triggers.  Her current medication regimen includes olanzapine , with a nighttime dose of 7.5 mg and a daytime dose of 2.5 mg. She has a history of using gabapentin , which was discontinued due to weight gain and fluid retention. She has not tried pregabalin  or Lyrica . She has a past medical history of kidney stones, with the last occurrence approximately seven years ago, prior to her first pregnancy.  No chest pain or major leg swelling. Patient is not aware of any history of cardiac issues. 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 No recent major kidney stones.       Patient Active Problem List   Diagnosis Date Noted   Daytime somnolence 10/01/2023   Chronic idiopathic constipation 09/04/2023   Bee allergy status 07/09/2023   BMI 36.0-36.9,adult 07/09/2023   Pelvic pain, chronic 07/04/2023   Anxiety and depression 07/04/2023   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/30/2023   Intractable nausea and vomiting 03/13/2023   Intractable vomiting with nausea 03/13/2023   Encounter for screening involving social determinants of health (SDoH) 10/07/2022   Chronic interstitial cystitis 05/05/2022   Chronic diarrhea 04/29/2022   Lower abdominal pain 04/28/2022   Adrenal insufficiency (HCC) 04/26/2022   Bradycardia 04/24/2022   Moderate persistent asthma with acute exacerbation 12/04/2021   Cervicalgia 10/31/2021   Borderline personality disorder in adult [facility]) 10/21/2021   Anisocoria 07/02/2021   History of excision of intestinal structure 05/23/2021   Attention deficit disorder 04/17/2021   Chronic fatigue syndrome 04/17/2021   Anosmia 04/17/2021   Chronic urticaria 01/15/2021   Local reaction to hymenoptera sting 01/15/2021   Perennial allergic rhinitis 01/15/2021  MDD (major depressive disorder), recurrent episode, severe (HCC) 11/19/2020   Fibromyalgia 05/11/2020   Gastroesophageal reflux disease 05/11/2020   Interstitial cystitis 05/11/2020   PTSD  (post-traumatic stress disorder) 05/11/2020   Primary insomnia 05/11/2020   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   Hypothyroidism due to Hashimoto's thyroiditis 10/03/2019   Anxiety 06/16/2019   Asthma 06/07/2019   Obesity 06/07/2019   Lumbar spondylosis 06/23/2018   Sacroiliac inflammation (HCC) 06/23/2018   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   History of upper limb amputation, wrist, 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   Past Medical History:  Diagnosis Date   Acute acalculous cholecystitis s/p lap cholecystectomy 05/11/2020 05/11/2020   Angio-edema 01/15/2021   Anxiety    Asthma    Chronic interstitial cystitis    Depression    Fibromyalgia    HELLP (hemolytic anemia/elev liver enzymes/low platelets in pregnancy) 12/08/2017   Hemorrhagic cyst of left ovary 04/15/2022   History of attempted suicide    as a teenager   History of borderline personality disorder    History of HELLP syndrome, currently pregnant 04/21/2019   Formatting of this note might be different from the original.  History of HELLP syndrome at 26 weeks 5 days (12/07/2017); admitted to [facility] with BP 200/120s, Plt 52, AST/ALT 471/375  APS evaluation negative (04/2019)   Daily ASA 81 mg   Challenging blood pressure control in current pregnancy      Plan:  - Baseline pre-eclampsia labs this pregnancy within normal limits (Cr: 0.71; urine p:c 0.106)  -    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 03/05/2022   Hypothyroidism due to Hashimoto's thyroiditis    IC (interstitial cystitis)    Insomnia    Kidney stones    Migraines    Miscarriage 09/2020   Necrotizing fasciitis (HCC)    left hand amputation   PTSD (post-traumatic stress disorder)    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   Secondary adrenal insufficiency (HCC)    Severe uncontrolled hypertension 10/08/2017   UTI (urinary tract infection)    Social History   Tobacco Use   Smoking status: Never    Passive exposure: Never   Smokeless tobacco: Never   Tobacco  comments:    She tried a cigarette and never smoked again  Vaping Use   Vaping status: Every Day   Devices: DELTA- 8  Substance Use Topics   Alcohol use: Not Currently    Comment: bottle of wine a week-occ   Drug use: Yes    Comment: DELTA-8 occasionally   Allergies  Allergen Reactions   Bee Venom Anaphylaxis, Swelling and Other (See Comments)    Swelling of face, throat. Airway restriction.   Coreg  [Carvedilol ] Shortness Of Breath and Other (See Comments)    Triggers asthma   Red Dye #40 (Allura Red) Other (See Comments)    Migraines   Compazine  [Prochlorperazine  Maleate] Other (See Comments)    Twitching Able to tolerate promethazine    Vicodin [Hydrocodone -Acetaminophen ] Itching, Nausea And Vomiting and Other (See Comments)    OK with Tylenol    Cymbalta [Duloxetine Hcl] Other (See Comments)    Irritability  Syncope    Ditropan  [Oxybutynin ] Other (See Comments)    Severe dry mouth   Elavil [Amitriptyline] Other (See Comments)    Irritability    Motrin  [Ibuprofen ] Other (See Comments)    Heartburn    Naprosyn  [Naproxen ] Other (See Comments)    Heartburn    Pyrimethamine Other (See Comments)    From Fansidar combination drug Triggers asthma   Sulfadoxine Other (See Comments)  From Fansidar combination drug Triggers asthma    Zestril  [Lisinopril ] Cough   Nifedical Xl [Nifedipine ] Nausea And Vomiting   Reglan  [Metoclopramide ] Swelling and Other (See Comments)    Restlessness Able to take with Benadryl    Trandate  [Labetalol ] Nausea And Vomiting and Other (See Comments)    Triggers asthma       Review of Systems  Neurological:  Positive for dizziness.   Per HPI.    Objective:     BP (!) 132/91   Pulse 73   Temp 98.6 F (37 C) (Oral)   Resp 16   Ht 5' 11 (1.803 m)   Wt 251 lb 14.4 oz (114.3 kg)   LMP 10/18/2023 (Approximate)   SpO2 95%   BMI 35.13 kg/m  BP Readings from Last 3 Encounters:  11/03/23 (!) 132/91  10/09/23 (!) 169/99  10/07/23 (!)  154/101   Wt Readings from Last 3 Encounters:  11/03/23 251 lb 14.4 oz (114.3 kg)  10/09/23 265 lb (120.2 kg)  10/07/23 265 lb (120.2 kg)      Physical Exam Constitutional:      General: She is not in acute distress.    Appearance: Normal appearance. She is not ill-appearing.  HENT:     Head: Normocephalic and atraumatic.     Nose: Nose normal.  Eyes:     General: No scleral icterus.    Conjunctiva/sclera: Conjunctivae normal.  Cardiovascular:     Rate and Rhythm: Normal rate and regular rhythm.     Heart sounds: Normal heart sounds. No murmur heard.    No friction rub.  Pulmonary:     Effort: Pulmonary effort is normal. No respiratory distress.     Breath sounds: Normal breath sounds. No wheezing, rhonchi or rales.  Musculoskeletal:        General: Normal range of motion.  Skin:    General: Skin is warm and dry.     Coloration: Skin is not jaundiced or pale.  Neurological:     General: No focal deficit present.     Mental Status: She is alert.  Psychiatric:        Mood and Affect: Mood normal.        Behavior: Behavior normal.      No results found for any visits on 11/03/23.  Last CBC Lab Results  Component Value Date   WBC 6.3 10/09/2023   HGB 14.8 10/09/2023   HCT 43.4 10/09/2023   MCV 92.3 10/09/2023   MCH 31.5 10/09/2023   RDW 13.2 10/09/2023   PLT 318 10/09/2023   Last metabolic panel Lab Results  Component Value Date   GLUCOSE 96 10/09/2023   NA 139 10/09/2023   K 4.0 10/09/2023   CL 100 10/09/2023   CO2 26 10/09/2023   BUN 19 10/09/2023   CREATININE 0.88 10/09/2023   GFRNONAA >60 10/09/2023   CALCIUM  10.2 10/09/2023   PHOS 3.9 04/22/2023   PROT 7.6 10/09/2023   ALBUMIN 4.3 10/09/2023   LABGLOB 2.4 07/09/2023   AGRATIO 1.9 08/14/2021   BILITOT 0.7 10/09/2023   ALKPHOS 48 10/09/2023   AST 24 10/09/2023   ALT 27 10/09/2023   ANIONGAP 13 10/09/2023   Last lipids Lab Results  Component Value Date   CHOL 185 07/09/2023   HDL 38 (L)  07/09/2023   LDLCALC 107 (H) 07/09/2023   TRIG 233 (H) 07/09/2023   CHOLHDL 4.9 (H) 07/09/2023   Last hemoglobin A1c Lab Results  Component Value Date   HGBA1C 5.2 07/09/2023  EKG: normal EKG, normal sinus rhythm, unchanged from previous tracings.    Assessment & Plan:   Assessment and Plan    Interstitial cystitis with chronic pelvic pain Chronic pelvic pain likely due to interstitial cystitis, feels the same as the other times. Recent treatment for UTI with nitrofurantoin by [facility] ED. Previous urology management includes bladder retraining. Today's urine dipstick was negative. Considering interstitial cystitis and severe pain, symptoms are likely related to interstitial cystitis rather than a UTI. Gabapentin  was previously ineffective and caused edema. - Prescribe pregabalin  (Lyrica ) 25 mg twice daily with refills. Monitor effectiveness before increasing dosage. - Send urine for culture. - Discuss with psychiatry the potential use of Cymbalta or Effexor  for pain management related to interstitial cystitis. - Consider referral to pain management if symptoms persist and are unmanageable.  Dizziness and lightheadedness Intermittent dizziness and lightheadedness, possibly related to hypotension or pain. Symptoms include lightheadedness and vertigo, exacerbated by standing quickly. No clear relation to head movement. Home blood pressure readings have been low, correlating with dizziness. No major cardiac history reported. - Perform orthostatic blood pressure measurements. - Check sodium and potassium levels due to changes in cortef  and history of secondary adrenal insufficiency. - Order EKG- at baseline today.  Secondary adrenal insufficiency Secondary adrenal insufficiency noted. Monitoring of sodium and potassium levels is indicated due to changes in cortisol levels. - Check sodium and potassium levels.  Shortness of breath O2 sat normal. Pulm exam insignificant. Intermittent  shortness of breath reported, occurring yesterday and this morning. No specific triggers identified. No history of cardiac issues reported. No major leg swelling or other signs of heart failure noted. - Order chest X-ray to evaluate for potential causes of shortness of breath. Cxr normal per radiology.    Patient appears stable for discharge but was given strict ER precautions.  Follow up as needed.   Markham Dumlao T Gabryel Files, PA-C

## 2023-11-04 LAB — COMPREHENSIVE METABOLIC PANEL WITH GFR
ALT: 25 IU/L (ref 0–32)
AST: 21 IU/L (ref 0–40)
Albumin: 4.8 g/dL (ref 3.9–4.9)
Alkaline Phosphatase: 67 IU/L (ref 44–121)
BUN/Creatinine Ratio: 17 (ref 9–23)
BUN: 16 mg/dL (ref 6–20)
Bilirubin Total: 0.4 mg/dL (ref 0.0–1.2)
CO2: 20 mmol/L (ref 20–29)
Calcium: 10.2 mg/dL (ref 8.7–10.2)
Chloride: 97 mmol/L (ref 96–106)
Creatinine, Ser: 0.92 mg/dL (ref 0.57–1.00)
Globulin, Total: 2.7 g/dL (ref 1.5–4.5)
Glucose: 89 mg/dL (ref 70–99)
Potassium: 4.3 mmol/L (ref 3.5–5.2)
Sodium: 135 mmol/L (ref 134–144)
Total Protein: 7.5 g/dL (ref 6.0–8.5)
eGFR: 83 mL/min/1.73

## 2023-11-04 LAB — CBC WITH DIFFERENTIAL/PLATELET
Basophils Absolute: 0.1 x10E3/uL (ref 0.0–0.2)
Basos: 1 %
EOS (ABSOLUTE): 0.1 x10E3/uL (ref 0.0–0.4)
Eos: 2 %
Hematocrit: 50.1 % — ABNORMAL HIGH (ref 34.0–46.6)
Hemoglobin: 16.1 g/dL — ABNORMAL HIGH (ref 11.1–15.9)
Immature Grans (Abs): 0 x10E3/uL (ref 0.0–0.1)
Immature Granulocytes: 0 %
Lymphocytes Absolute: 2.5 x10E3/uL (ref 0.7–3.1)
Lymphs: 33 %
MCH: 30.6 pg (ref 26.6–33.0)
MCHC: 32.1 g/dL (ref 31.5–35.7)
MCV: 95 fL (ref 79–97)
Monocytes Absolute: 0.7 x10E3/uL (ref 0.1–0.9)
Monocytes: 10 %
Neutrophils Absolute: 4.1 x10E3/uL (ref 1.4–7.0)
Neutrophils: 53 %
Platelets: 378 x10E3/uL (ref 150–450)
RBC: 5.26 x10E6/uL (ref 3.77–5.28)
RDW: 13.3 % (ref 11.7–15.4)
WBC: 7.6 x10E3/uL (ref 3.4–10.8)

## 2023-11-05 LAB — URINE CULTURE

## 2023-11-07 ENCOUNTER — Encounter (HOSPITAL_COMMUNITY): Payer: Self-pay

## 2023-11-07 ENCOUNTER — Emergency Department (HOSPITAL_COMMUNITY)
Admission: EM | Admit: 2023-11-07 | Discharge: 2023-11-08 | Disposition: A | Discharge status: Home / Self Care | Payer: MEDICAID | Attending: Emergency Medicine | Admitting: Emergency Medicine

## 2023-11-07 DIAGNOSIS — N3010 Interstitial cystitis (chronic) without hematuria: Secondary | ICD-10-CM | POA: Insufficient documentation

## 2023-11-07 DIAGNOSIS — I169 Hypertensive crisis, unspecified: Secondary | ICD-10-CM | POA: Diagnosis not present

## 2023-11-07 DIAGNOSIS — R102 Pelvic and perineal pain: Secondary | ICD-10-CM | POA: Diagnosis present

## 2023-11-07 DIAGNOSIS — I10 Essential (primary) hypertension: Secondary | ICD-10-CM | POA: Insufficient documentation

## 2023-11-07 DIAGNOSIS — J45909 Unspecified asthma, uncomplicated: Secondary | ICD-10-CM | POA: Diagnosis not present

## 2023-11-07 DIAGNOSIS — E039 Hypothyroidism, unspecified: Secondary | ICD-10-CM | POA: Insufficient documentation

## 2023-11-07 LAB — URINALYSIS, ROUTINE W REFLEX MICROSCOPIC
Bacteria, UA: NONE SEEN
Bilirubin Urine: NEGATIVE
Glucose, UA: NEGATIVE mg/dL
Ketones, ur: NEGATIVE mg/dL
Leukocytes,Ua: NEGATIVE
Nitrite: NEGATIVE
Protein, ur: NEGATIVE mg/dL
Specific Gravity, Urine: 1.02 (ref 1.005–1.030)
pH: 5 (ref 5.0–8.0)

## 2023-11-07 LAB — COMPREHENSIVE METABOLIC PANEL WITH GFR
ALT: 20 U/L (ref 0–44)
AST: 22 U/L (ref 15–41)
Albumin: 4.7 g/dL (ref 3.5–5.0)
Alkaline Phosphatase: 68 U/L (ref 38–126)
Anion gap: 16 — ABNORMAL HIGH (ref 5–15)
BUN: 20 mg/dL (ref 6–20)
CO2: 20 mmol/L — ABNORMAL LOW (ref 22–32)
Calcium: 10.1 mg/dL (ref 8.9–10.3)
Chloride: 100 mmol/L (ref 98–111)
Creatinine, Ser: 1.05 mg/dL — ABNORMAL HIGH (ref 0.44–1.00)
GFR, Estimated: >60 mL/min (ref >60)
Glucose, Bld: 113 mg/dL — ABNORMAL HIGH (ref 70–99)
Potassium: 3.7 mmol/L (ref 3.5–5.1)
Sodium: 135 mmol/L (ref 135–145)
Total Bilirubin: 0.3 mg/dL (ref 0.0–1.2)
Total Protein: 7.8 g/dL (ref 6.5–8.1)

## 2023-11-07 LAB — LIPASE, BLOOD: Lipase: 27 U/L (ref 11–51)

## 2023-11-07 LAB — HCG, SERUM, QUALITATIVE: Preg, Serum: NEGATIVE

## 2023-11-07 MED ORDER — KETAMINE HCL 50 MG/5ML IJ SOSY
0.3000 mg/kg | PREFILLED_SYRINGE | Freq: Once | INTRAMUSCULAR | Status: AC
Start: 2023-11-07 — End: 2023-11-07
  Administered 2023-11-07: 34 mg via INTRAVENOUS
  Filled 2023-11-07: qty 5

## 2023-11-07 MED ORDER — ONDANSETRON HCL 4 MG/2ML IJ SOLN
4.0000 mg | Freq: Once | INTRAMUSCULAR | Status: AC | PRN
  Administered 2023-11-07: 4 mg via INTRAVENOUS
  Filled 2023-11-07: qty 2

## 2023-11-07 MED ORDER — SODIUM CHLORIDE 0.9 % IV BOLUS
1000.0000 mL | Freq: Once | INTRAVENOUS | Status: AC
  Administered 2023-11-07: 1000 mL via INTRAVENOUS

## 2023-11-07 NOTE — ED Triage Notes (Signed)
 PT complaints of pelvic pain for interstitial cystitis, pt unable to manage pain at home, 10 out 10, nausea and vomit present.   A lot of burning and pain with urination  Alert and oriented at triage.

## 2023-11-07 NOTE — ED Notes (Signed)
 Fellow Nurse made multiple attempts to get line, unsuccessful. Will place orders for IV US  as well as seek fellow RN/Medic for assistance

## 2023-11-07 NOTE — ED Provider Notes (Signed)
 [place] EMERGENCY DEPARTMENT AT [facility] Provider Note   CSN: [id number] Arrival date & time: 11/07/23  8087     Patient presents with: Pelvic Pain   Krystal [name] is a 33 y.o. female.    Pelvic Pain  Patient with pelvic pain.  History of interstitial cystitis.  Sees Dr. Janit at [facility] for this.  States has been flaring for the last week.  Is on chronic tramadol .  States had recently added Lyrica .  Reviewing notes has had had distentions and treatments.  States her pain medicines are really not been working.  States she has been throwing up because of it.    Past Medical History:  Diagnosis Date   Acute acalculous cholecystitis s/p lap cholecystectomy 05/11/2020 05/11/2020   Angio-edema 01/15/2021   Anxiety    Asthma    Chronic interstitial cystitis    Depression    Fibromyalgia    HELLP (hemolytic anemia/elev liver enzymes/low platelets in pregnancy) 12/08/2017   Hemorrhagic cyst of left ovary 04/15/2022   History of attempted suicide    as a teenager   History of borderline personality disorder    History of HELLP syndrome, currently pregnant 04/21/2019   Formatting of this note might be different from the original.  History of HELLP syndrome at 26 weeks 5 days (12/07/2017); admitted to [facility] with BP 200/120s, Plt 52, AST/ALT 471/375  APS evaluation negative (04/2019)   Daily ASA 81 mg   Challenging blood pressure control in current pregnancy      Plan:  - Baseline pre-eclampsia labs this pregnancy within normal limits (Cr: 0.71; urine p:c 0.106)  -    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 03/05/2022   Hypothyroidism due to Hashimoto's thyroiditis    IC (interstitial cystitis)    Insomnia    Kidney stones    Migraines    Miscarriage 09/2020   Necrotizing fasciitis (HCC)    left hand amputation   PTSD (post-traumatic stress disorder)    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   Secondary adrenal insufficiency (HCC)    Severe uncontrolled  hypertension 10/08/2017   UTI (urinary tract infection)   ' Prior to Admission medications   Medication Sig Start Date End Date Taking? Authorizing Provider  acetaminophen  (TYLENOL ) 500 MG tablet Take 1,000 mg by mouth 3 (three) times daily as needed (for pain or headaches).    [provider]  albuterol  (VENTOLIN  HFA) 108 (90 Base) MCG/ACT inhaler Inhale 2 puffs into the lungs every 4 (four) hours as needed for wheezing or shortness of breath. 10/15/22   Luke Orlan HERO, DO  BENADRYL  ALLERGY 25 MG capsule Take 25 mg by mouth every 6 (six) hours as needed for allergies or itching.    [provider]  bupivacaine ,PF, (MARCAINE ) 0.5 % SOLN injection Instill 15 mLs into bladder daily with heparin . 04/06/23     EPINEPHrine  0.3 mg/0.3 mL IJ SOAJ injection Inject 0.3 mg into the muscle as needed for anaphylaxis. 01/15/21   Luke Orlan HERO, DO  FLUoxetine  (PROZAC ) 20 MG capsule Take 3 capsules (60 mg total) by mouth daily. 07/22/23 11/03/23  Rothfuss, Jacob T, PA-C  heparin  10000 UNIT/ML injection Instill 4 mLs (40,000 Units total) into the bladder daily with bupivacaine . 04/06/23     hydrALAZINE  (APRESOLINE ) 10 MG tablet Take 1 tablet (10 mg total) by mouth 3 (three) times daily. 10/01/23   Rothfuss, Jacob T, PA-C  hydrocortisone  (CORTEF ) 10 MG tablet Take 10 mg by mouth. 10mg  in the AM & 5  in PM    [provider]  hydrOXYzine  (ATARAX ) 25 MG tablet Take 50 mg by mouth at bedtime.    [provider]  irbesartan -hydrochlorothiazide  (AVALIDE) 150-12.5 MG tablet Take 1 tablet by mouth in the morning and at bedtime. 09/03/23 11/03/23  Rothfuss, Jacob T, PA-C  levothyroxine  (SYNTHROID ) 150 MCG tablet Take 1 tablet (150 mcg total) by mouth daily at 6 (six) AM. 10/06/22 11/03/23  Elnora Ip, MD  linaclotide  (LINZESS ) 145 MCG CAPS capsule Take 1 capsule (145 mcg total) by mouth daily before breakfast. 09/03/23   Rothfuss, Jacob T, PA-C  meloxicam (MOBIC) 7.5 MG tablet Take 7.5-15 mg by  mouth daily as needed. 09/20/23   [provider]  NON FORMULARY Take 1 capsule by mouth See admin instructions. Beef organ capsules- Take 1 capsule by mouth once a day    [provider]  OLANZapine  (ZYPREXA ) 2.5 MG tablet Take 2.5 mg by mouth daily.    [provider]  OLANZapine  (ZYPREXA ) 7.5 MG tablet Take 7.5 mg by mouth at bedtime.    [provider]  ondansetron  (ZOFRAN -ODT) 4 MG disintegrating tablet Take 1 tablet (4 mg total) by mouth every 8 (eight) hours as needed for nausea or vomiting. 10/07/23   Sofia, Leslie K, PA-C  pantoprazole  (PROTONIX ) 40 MG tablet Take 1 tablet (40 mg total) by mouth 2 (two) times daily as needed (for heartburn). 10/01/23   Rothfuss, Jacob T, PA-C  pregabalin  (LYRICA ) 25 MG capsule Take 1 capsule (25 mg total) by mouth 2 (two) times daily. 11/03/23   Rothfuss, Jacob T, PA-C  RASPBERRY PO Take 2 capsules by mouth See admin instructions. Raspberry Leaf capsules- Take 2 capsules by mouth once a day    [provider]  Syringe, Disposable, 20 ML MISC Use as directed for daily bladder instillations 04/06/23     traMADol  (ULTRAM ) 50 MG tablet Take 1 tablet (50 mg total) by mouth every 12 (twelve) hours as needed for moderate pain (pain score 4-6). 04/27/23   Ezenduka, Nkeiruka J, MD    Allergies: Bee venom, Coreg  [carvedilol ], Red dye #40 (allura red), Compazine  [prochlorperazine  maleate], Vicodin [hydrocodone -acetaminophen ], Cymbalta [duloxetine hcl], Ditropan  [oxybutynin ], Elavil [amitriptyline], Motrin  [ibuprofen ], Naprosyn  [naproxen ], Pyrimethamine, Sulfadoxine, Zestril  [lisinopril ], Nifedical xl [nifedipine ], Reglan  [metoclopramide ], and Trandate  [labetalol ]    Review of Systems  Genitourinary:  Positive for pelvic pain.    Updated Vital Signs BP (!) 153/103   Pulse 80   Temp 97.6 F (36.4 C) (Oral)   Resp 18   LMP 10/18/2023 (Approximate)   SpO2 100%   Physical Exam Vitals reviewed.  Constitutional:       Appearance: Normal appearance.  Abdominal:     Tenderness: There is abdominal tenderness.     Comments: Lower abdominal tenderness without rebound or guarding.  No hernia palpated.  Neurological:     Mental Status: She is alert.     (all labs ordered are listed, but only abnormal results are displayed) Labs Reviewed  COMPREHENSIVE METABOLIC PANEL WITH GFR - Abnormal; Notable for the following components:      Result Value   CO2 20 (*)    Glucose, Bld 113 (*)    Creatinine, Ser 1.05 (*)    Anion gap 16 (*)    All other components within normal limits  URINALYSIS, ROUTINE W REFLEX MICROSCOPIC - Abnormal; Notable for the following components:   APPearance HAZY (*)    Hgb urine dipstick SMALL (*)    All other components within normal  limits  LIPASE, BLOOD  HCG, SERUM, QUALITATIVE  CBC    EKG: None  Radiology: No results found.   Procedures   Medications Ordered in the ED  ondansetron  (ZOFRAN ) injection 4 mg (has no administration in time range)  sodium chloride  0.9 % bolus 1,000 mL (has no administration in time range)  ketamine  50 mg in normal saline 5 mL (10 mg/mL) syringe (34 mg Intravenous Given 11/07/23 2243)                                    Medical Decision Making Amount and/or Complexity of Data Reviewed Labs: ordered.  Risk Prescription drug management.   Patient chronic abdominal pain due to interstitial cystitis.  States been vomiting because of it.  No relief with her chronic tramadol  at home.  Reviewed urology note.  Reviewed previous ER visits.  Reviewed drug database.  Gets chronic prescriptions for tramadol  from urology but appears to have oxycodone  prescriptions from various other providers including ER and other urologist.  Blood work overall reassuring.  Creatinine mildly elevated.  In the last few months she has had 3 CT scans.  She has had 10 CT abdomen pelvis through our system.  Do not think we need more CT imaging at this time.  With  recurrent abdominal pain appears to be likely interstitial cystitis.  Discussed with patient about her chronic pain and will attempt a dose pain dose ketamine  through the ER to see if this will help.  Care turned over to Dr. Darra.     Final diagnoses:  Interstitial cystitis    ED Discharge Orders     None          Patsey Lot, MD 11/07/23 2249

## 2023-11-07 NOTE — ED Notes (Signed)
 This nurse attempted to place line, unsuccessful. Will seek out assistance from fellow RN/Medic.

## 2023-11-07 NOTE — ED Provider Notes (Signed)
 Blood pressure (!) 209/121, pulse 67, temperature 97.6 F (36.4 C), temperature source Oral, resp. rate 20, last menstrual period 10/18/2023, SpO2 100%.  Assuming care from Dr. Patsey.  In short, Maricruz C Asfaw is a 33 y.o. female with a chief complaint of Pelvic Pain .  Refer to the original H&P for additional details.  The current plan of care is to follow up on CBC and pain mgmt.  01:11 AM  CBC reassuring.  Patient's pain better controlled on reassessment.  We discussed whether or not she was following with pain management or discussing this with her primary care doctor.  She states that decision has been deferred to her urologist by her PCP.  I stated that in looking at her PDMP she is getting frequent, small prescriptions for opiate medications from different physicians and that this would likely be best managed by a single provider, such as a pain management physician.  She will bring this to her PCP and urology teams to consider.  I told her that in this setting, I will not be prescribing her opiate medications from the ED. She verbalizes understanding and feels comfortable with discharge.    Darra Fonda MATSU, MD 11/08/23 [id number]

## 2023-11-08 LAB — CBC
HCT: 45 % (ref 36.0–46.0)
Hemoglobin: 14.8 g/dL (ref 12.0–15.0)
MCH: 30.6 pg (ref 26.0–34.0)
MCHC: 32.9 g/dL (ref 30.0–36.0)
MCV: 93 fL (ref 80.0–100.0)
Platelets: 179 K/uL (ref 150–400)
RBC: 4.84 MIL/uL (ref 3.87–5.11)
RDW: 13.4 % (ref 11.5–15.5)
WBC: 9.8 K/uL (ref 4.0–10.5)
nRBC: 0 % (ref 0.0–0.2)

## 2023-11-08 MED ORDER — HYDROMORPHONE HCL 1 MG/ML IJ SOLN
1.0000 mg | Freq: Once | INTRAMUSCULAR | Status: AC
  Administered 2023-11-08: 1 mg via INTRAVENOUS
  Filled 2023-11-08: qty 1

## 2023-11-08 NOTE — Discharge Instructions (Signed)
 Please continue your home medications for pain.  As we discussed, please consider following with your urology and PCP teams to discuss the possibility of pain management specialist referral.

## 2023-11-12 ENCOUNTER — Ambulatory Visit (INDEPENDENT_AMBULATORY_CARE_PROVIDER_SITE_OTHER): Payer: MEDICAID | Admitting: Student

## 2023-11-12 ENCOUNTER — Encounter (HOSPITAL_BASED_OUTPATIENT_CLINIC_OR_DEPARTMENT_OTHER): Payer: Self-pay | Admitting: Student

## 2023-11-12 ENCOUNTER — Encounter (HOSPITAL_BASED_OUTPATIENT_CLINIC_OR_DEPARTMENT_OTHER): Payer: Self-pay

## 2023-11-12 VITALS — BP 135/96 | HR 75 | Temp 98.3°F | Resp 16 | Ht 71.0 in | Wt 256.6 lb

## 2023-11-12 DIAGNOSIS — N3010 Interstitial cystitis (chronic) without hematuria: Secondary | ICD-10-CM

## 2023-11-12 DIAGNOSIS — K219 Gastro-esophageal reflux disease without esophagitis: Secondary | ICD-10-CM | POA: Diagnosis not present

## 2023-11-12 DIAGNOSIS — I1A0 Resistant hypertension: Secondary | ICD-10-CM | POA: Diagnosis not present

## 2023-11-12 NOTE — Progress Notes (Signed)
 Established Patient Office Visit  Subjective   Patient ID: Krystal [name], female    DOB: [birth year]  Age: 33 y.o. MRN: [id number]  Chief Complaint  Patient presents with   Medical Management of Chronic Issues    Follow up. Not feeling much better. Light headedness is gone.    HPI  Discussed the use of AI scribe software for clinical note transcription with the patient, who gave verbal consent to proceed.  History of Present Illness   Shavona C Claude is a 33 year old female with interstitial cystitis who presents with persistent pain.  She experiences ongoing severe pain related to her interstitial cystitis, significantly affecting her quality of life, including her ability to enjoy time with her children. The pain is so severe that she has limited her diet to primarily meat. She has visited the emergency room three times in the past month due to this pain, where she received Dilaudid  and ketamine , but these did not provide relief. She is currently using tramadol  for pain management, prescribed by her urologist.  She reports relief from joint pain since starting Lyrica , which has alleviated her fibromyalgia symptoms diagnosed eight years ago. She is satisfied with the current dose and prefers to maintain it as it effectively manages her joint pain.  Her blood pressure has been fluctuating. She notes that her blood pressure tends to drop when she feels excess fatigue, although her heart rate has remained stable. She is currently on hydralazine  10 mg three times daily, but sometimes struggles to keep her medications down due to stomach issues.  She has a history of adrenal crisis, which led to the discontinuation of clonidine  and spironolactone . She is cautious about reintroducing these medications due to past experiences with low heart rate and adrenal issues.  In terms of behavioral health, she is on olanzapine , which was recently adjusted due to excessive sedation. She underwent  genetic testing to determine the most suitable medications and is currently on a reduced dose of olanzapine  to manage her symptoms without excessive drowsiness.      Patient Active Problem List   Diagnosis Date Noted   Daytime somnolence 10/01/2023   Chronic idiopathic constipation 09/04/2023   Bee allergy status 07/09/2023   BMI 36.0-36.9,adult 07/09/2023   Pelvic pain, chronic 07/04/2023   Anxiety and depression 07/04/2023   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/30/2023   Intractable nausea and vomiting 03/13/2023   Intractable vomiting with nausea 03/13/2023   Encounter for screening involving social determinants of health (SDoH) 10/07/2022   Chronic interstitial cystitis 05/05/2022   Chronic diarrhea 04/29/2022   Lower abdominal pain 04/28/2022   Adrenal insufficiency (HCC) 04/26/2022   Bradycardia 04/24/2022   Moderate persistent asthma with acute exacerbation 12/04/2021   Cervicalgia 10/31/2021   Borderline personality disorder in adult [facility]) 10/21/2021   Anisocoria 07/02/2021   History of excision of intestinal structure 05/23/2021   Attention deficit disorder 04/17/2021   Chronic fatigue syndrome 04/17/2021   Anosmia 04/17/2021   Chronic urticaria 01/15/2021   Local reaction to hymenoptera sting 01/15/2021   Perennial allergic rhinitis 01/15/2021   MDD (major depressive disorder), recurrent episode, severe (HCC) 11/19/2020   Fibromyalgia 05/11/2020   Gastroesophageal reflux disease 05/11/2020   Interstitial cystitis 05/11/2020   PTSD (post-traumatic stress disorder) 05/11/2020   Primary insomnia 05/11/2020   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   Hypothyroidism due to Hashimoto's thyroiditis 10/03/2019   Anxiety 06/16/2019   Asthma 06/07/2019   Obesity 06/07/2019   Lumbar spondylosis 06/23/2018   Sacroiliac inflammation (  HCC) 06/23/2018   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   History of upper limb amputation, wrist, 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   Past Medical History:  Diagnosis  Date   Acute acalculous cholecystitis s/p lap cholecystectomy 05/11/2020 05/11/2020   Angio-edema 01/15/2021   Anxiety    Asthma    Chronic interstitial cystitis    Depression    Fibromyalgia    HELLP (hemolytic anemia/elev liver enzymes/low platelets in pregnancy) 12/08/2017   Hemorrhagic cyst of left ovary 04/15/2022   History of attempted suicide    as a teenager   History of borderline personality disorder    History of HELLP syndrome, currently pregnant 04/21/2019   Formatting of this note might be different from the original.  History of HELLP syndrome at 26 weeks 5 days (12/07/2017); admitted to [facility] with BP 200/120s, Plt 52, AST/ALT 471/375  APS evaluation negative (04/2019)   Daily ASA 81 mg   Challenging blood pressure control in current pregnancy      Plan:  - Baseline pre-eclampsia labs this pregnancy within normal limits (Cr: 0.71; urine p:c 0.106)  -    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 03/05/2022   Hypothyroidism due to Hashimoto's thyroiditis    IC (interstitial cystitis)    Insomnia    Kidney stones    Migraines    Miscarriage 09/2020   Necrotizing fasciitis (HCC)    left hand amputation   PTSD (post-traumatic stress disorder)    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   Secondary adrenal insufficiency (HCC)    Severe uncontrolled hypertension 10/08/2017   UTI (urinary tract infection)    Social History   Tobacco Use   Smoking status: Never    Passive exposure: Never   Smokeless tobacco: Never   Tobacco comments:    She tried a cigarette and never smoked again  Vaping Use   Vaping status: Every Day   Devices: DELTA- 8  Substance Use Topics   Alcohol use: Not Currently    Comment: NOT CURRENTLY DRINKING 11/12/2023   Drug use: Yes    Comment: DELTA-8 occasionally   Allergies  Allergen Reactions   Bee Venom Anaphylaxis, Swelling and Other (See Comments)    Swelling of face, throat. Airway restriction.   Coreg  [Carvedilol ] Shortness Of Breath  and Other (See Comments)    Triggers asthma   Red Dye #40 (Allura Red) Other (See Comments)    Migraines   Compazine  [Prochlorperazine  Maleate] Other (See Comments)    Twitching Able to tolerate promethazine    Vicodin [Hydrocodone -Acetaminophen ] Itching, Nausea And Vomiting and Other (See Comments)    OK with Tylenol    Cymbalta [Duloxetine Hcl] Other (See Comments)    Irritability  Syncope    Ditropan  [Oxybutynin ] Other (See Comments)    Severe dry mouth   Elavil [Amitriptyline] Other (See Comments)    Irritability    Motrin  [Ibuprofen ] Other (See Comments)    Heartburn    Naprosyn  [Naproxen ] Other (See Comments)    Heartburn    Pyrimethamine Other (See Comments)    From Fansidar combination drug Triggers asthma   Sulfadoxine Other (See Comments)    From Fansidar combination drug Triggers asthma    Zestril  [Lisinopril ] Cough   Nifedical Xl [Nifedipine ] Nausea And Vomiting   Reglan  [Metoclopramide ] Swelling and Other (See Comments)    Restlessness Able to take with Benadryl    Trandate  [Labetalol ] Nausea And Vomiting and Other (See Comments)    Triggers asthma       ROS Per HPI.    Objective:  BP (!) 135/96   Pulse 75   Temp 98.3 F (36.8 C) (Oral)   Resp 16   Ht 5' 11 (1.803 m)   Wt 256 lb 9.6 oz (116.4 kg)   LMP 11/11/2023 (Approximate)   SpO2 96%   BMI 35.79 kg/m  BP Readings from Last 3 Encounters:  11/12/23 (!) 135/96  11/08/23 (!) 173/101  11/03/23 (!) 137/95   Wt Readings from Last 3 Encounters:  11/12/23 256 lb 9.6 oz (116.4 kg)  11/03/23 251 lb 14.4 oz (114.3 kg)  10/09/23 265 lb (120.2 kg)      Physical Exam Constitutional:      General: She is not in acute distress.    Appearance: Normal appearance. She is not ill-appearing.     Comments: In tears with pain though appears better towards the end of the exam  HENT:     Head: Normocephalic and atraumatic.     Nose: Nose normal.  Eyes:     General: No scleral icterus.     Conjunctiva/sclera: Conjunctivae normal.  Cardiovascular:     Rate and Rhythm: Normal rate and regular rhythm.     Heart sounds: Normal heart sounds. No murmur heard.    No friction rub.  Pulmonary:     Effort: Pulmonary effort is normal. No respiratory distress.     Breath sounds: Normal breath sounds. No wheezing, rhonchi or rales.  Musculoskeletal:        General: Normal range of motion.  Skin:    General: Skin is warm and dry.     Coloration: Skin is not jaundiced or pale.  Neurological:     General: No focal deficit present.     Mental Status: She is alert.  Psychiatric:        Mood and Affect: Mood normal.        Behavior: Behavior normal.      No results found for any visits on 11/12/23.  Last CBC Lab Results  Component Value Date   WBC 9.8 11/07/2023   HGB 14.8 11/07/2023   HCT 45.0 11/07/2023   MCV 93.0 11/07/2023   MCH 30.6 11/07/2023   RDW 13.4 11/07/2023   PLT 179 11/07/2023   Last metabolic panel Lab Results  Component Value Date   GLUCOSE 113 (H) 11/07/2023   NA 135 11/07/2023   K 3.7 11/07/2023   CL 100 11/07/2023   CO2 20 (L) 11/07/2023   BUN 20 11/07/2023   CREATININE 1.05 (H) 11/07/2023   GFRNONAA >60 11/07/2023   CALCIUM  10.1 11/07/2023   PHOS 3.9 04/22/2023   PROT 7.8 11/07/2023   ALBUMIN 4.7 11/07/2023   LABGLOB 2.7 11/03/2023   AGRATIO 1.9 08/14/2021   BILITOT 0.3 11/07/2023   ALKPHOS 68 11/07/2023   AST 22 11/07/2023   ALT 20 11/07/2023   ANIONGAP 16 (H) 11/07/2023   Last lipids Lab Results  Component Value Date   CHOL 185 07/09/2023   HDL 38 (L) 07/09/2023   LDLCALC 107 (H) 07/09/2023   TRIG 233 (H) 07/09/2023   CHOLHDL 4.9 (H) 07/09/2023   Last hemoglobin A1c Lab Results  Component Value Date   HGBA1C 5.2 07/09/2023      The ASCVD Risk score (Arnett DK, et al., 2019) failed to calculate for the following reasons:   The 2019 ASCVD risk score is only valid for ages 53 to 56    Assessment & Plan:   Assessment and  Plan    Interstitial Cystitis Chronic bladder pain persists despite  current management, significantly affecting quality of life. Current management includes bladder installations and tramadol  prescribed by urology. She independently explores dietary modifications, noting certain foods exacerbate symptoms. Urology follow-up is scheduled for today, but further pain management may be necessary if no new interventions are proposed. She has visited the ER three times in the past month, indicating the severity of the condition. - Continue tramadol  as prescribed by urology - Continue lyrica  - Refer to [facility] in [place] for further pain management - Discuss dietary modifications with urology  Hypertension Blood pressure is slightly elevated at 139/90 mmHg, likely influenced by adrenal insufficiency and pain. Experiences lightheadedness when blood pressure is in the low 120s/80s. Current management includes hydralazine  10 mg three times daily, with the option to add an extra dose if blood pressure rises. She prefers to maintain current management to allow the body to adjust. - Continue hydralazine  10 mg three times daily, add an additional 10 mg tablet at one time during the day to assess tolerability - Monitor blood pressure and adjust treatment as needed  GERD Chronic, stable. Reflux is well controlled.  - Continue current reflux management  I personally spent a total of 34 minutes in the care of the patient today including preparing to see the patient, getting/reviewing separately obtained history, performing a medically appropriate exam/evaluation, counseling and educating, placing orders, and documenting clinical information in the EHR.   Return in about 3 months (around 02/11/2024) for HTN.    Mozella Rexrode T Marquice Uddin, PA-C

## 2023-11-12 NOTE — Patient Instructions (Addendum)
 It was nice to see you today  You can try to add one extra hydralazine  (20 mg total) at one of the 3 times you take it during the day, please let me know how your blood pressure responds if you do.  If you have any problems before your next visit feel free to message me via MyChart (minor issues or questions) or call the office, otherwise you may reach out to schedule an office visit.  Thank you! Omya Winfield, PA-C

## 2023-11-13 ENCOUNTER — Encounter (HOSPITAL_BASED_OUTPATIENT_CLINIC_OR_DEPARTMENT_OTHER): Payer: Self-pay | Admitting: Student

## 2023-11-15 ENCOUNTER — Emergency Department (HOSPITAL_COMMUNITY)
Admission: EM | Admit: 2023-11-15 | Discharge: 2023-11-15 | Disposition: A | Discharge status: Home / Self Care | Payer: MEDICAID

## 2023-11-15 DIAGNOSIS — R102 Pelvic and perineal pain: Secondary | ICD-10-CM | POA: Insufficient documentation

## 2023-11-15 DIAGNOSIS — G8929 Other chronic pain: Secondary | ICD-10-CM | POA: Insufficient documentation

## 2023-11-15 LAB — CBC WITH DIFFERENTIAL/PLATELET
Abs Immature Granulocytes: 0.03 K/uL (ref 0.00–0.07)
Basophils Absolute: 0.1 K/uL (ref 0.0–0.1)
Basophils Relative: 1 %
Eosinophils Absolute: 0 K/uL (ref 0.0–0.5)
Eosinophils Relative: 0 %
HCT: 44.6 % (ref 36.0–46.0)
Hemoglobin: 14.5 g/dL (ref 12.0–15.0)
Immature Granulocytes: 1 %
Lymphocytes Relative: 32 %
Lymphs Abs: 2 K/uL (ref 0.7–4.0)
MCH: 30.2 pg (ref 26.0–34.0)
MCHC: 32.5 g/dL (ref 30.0–36.0)
MCV: 92.9 fL (ref 80.0–100.0)
Monocytes Absolute: 0.6 K/uL (ref 0.1–1.0)
Monocytes Relative: 10 %
Neutro Abs: 3.6 K/uL (ref 1.7–7.7)
Neutrophils Relative %: 56 %
Platelets: 343 K/uL (ref 150–400)
RBC: 4.8 MIL/uL (ref 3.87–5.11)
RDW: 12.9 % (ref 11.5–15.5)
WBC: 6.3 K/uL (ref 4.0–10.5)
nRBC: 0 % (ref 0.0–0.2)

## 2023-11-15 LAB — URINALYSIS, ROUTINE W REFLEX MICROSCOPIC
Bilirubin Urine: NEGATIVE
Glucose, UA: NEGATIVE mg/dL
Ketones, ur: NEGATIVE mg/dL
Leukocytes,Ua: NEGATIVE
Nitrite: NEGATIVE
Protein, ur: NEGATIVE mg/dL
Specific Gravity, Urine: 1.011 (ref 1.005–1.030)
pH: 5 (ref 5.0–8.0)

## 2023-11-15 LAB — BASIC METABOLIC PANEL WITH GFR
Anion gap: 13 (ref 5–15)
BUN: 18 mg/dL (ref 6–20)
CO2: 22 mmol/L (ref 22–32)
Calcium: 9.2 mg/dL (ref 8.9–10.3)
Chloride: 105 mmol/L (ref 98–111)
Creatinine, Ser: 0.9 mg/dL (ref 0.44–1.00)
GFR, Estimated: >60 mL/min (ref >60)
Glucose, Bld: 85 mg/dL (ref 70–99)
Potassium: 3.8 mmol/L (ref 3.5–5.1)
Sodium: 140 mmol/L (ref 135–145)

## 2023-11-15 LAB — MAGNESIUM: Magnesium: 2 mg/dL (ref 1.7–2.4)

## 2023-11-15 MED ORDER — HYDROMORPHONE HCL 1 MG/ML IJ SOLN
1.0000 mg | Freq: Once | INTRAMUSCULAR | Status: AC
  Administered 2023-11-15: 1 mg via INTRAVENOUS
  Filled 2023-11-15: qty 1

## 2023-11-15 MED ORDER — PROMETHAZINE HCL 25 MG/ML IJ SOLN
12.5000 mg | Freq: Four times a day (QID) | INTRAMUSCULAR | Status: DC | PRN
  Administered 2023-11-15: 12.5 mg via INTRAMUSCULAR
  Filled 2023-11-15: qty 1

## 2023-11-15 MED ORDER — HYDROMORPHONE HCL 1 MG/ML IJ SOLN
1.0000 mg | Freq: Once | INTRAMUSCULAR | Status: AC
  Administered 2023-11-15: 1 mg via INTRAMUSCULAR
  Filled 2023-11-15: qty 1

## 2023-11-15 MED ORDER — KETOROLAC TROMETHAMINE 15 MG/ML IJ SOLN
15.0000 mg | Freq: Once | INTRAMUSCULAR | Status: AC
  Administered 2023-11-15: 15 mg via INTRAVENOUS
  Filled 2023-11-15: qty 1

## 2023-11-15 MED ORDER — SODIUM CHLORIDE 0.9 % IV BOLUS
1000.0000 mL | Freq: Once | INTRAVENOUS | Status: AC
  Administered 2023-11-15: 1000 mL via INTRAVENOUS

## 2023-11-15 MED ORDER — HYDROMORPHONE HCL 1 MG/ML IJ SOLN: 1.0000 mg | Freq: Once | INTRAMUSCULAR | Status: DC

## 2023-11-15 MED ORDER — SODIUM CHLORIDE 0.9 % IV SOLN
12.5000 mg | Freq: Four times a day (QID) | INTRAVENOUS | Status: DC | PRN
  Filled 2023-11-15: qty 12.5

## 2023-11-15 MED ORDER — SODIUM CHLORIDE 0.9 % IV SOLN
12.5000 mg | Freq: Four times a day (QID) | INTRAVENOUS | Status: DC | PRN
  Administered 2023-11-15: 12.5 mg via INTRAVENOUS
  Filled 2023-11-15: qty 0.5

## 2023-11-15 NOTE — ED Provider Notes (Signed)
 [place] EMERGENCY DEPARTMENT AT [facility] Provider Note   CSN: [id number] Arrival date & time: 11/15/23  9164     Patient presents with: bladder pain   Krystal [name] is a 33 y.o. female patient with history of chronic interstitial cystitis who presents to the emergency department today for further evaluation of pelvic pain.  Patient has been dealing with this pain daily for several months.  She has been seen evaluated the emergency department numerous times for this.  She has received Dilaudid  and ketamine .  She has had several CT scans for this over the last 3 months in the emergency department.  Currently being treated at urology over at [facility] with chronic tramadol .  Scheduled to see pain management clinic with [facility].  Referral has been placed she does not have an appointment yet.  Coming in today with acute on chronic pelvic pain.  She states that she has been having intractable nausea and vomiting secondary to pain on able to take her pain medications.  She states that nothing is different about her pain apart from the severity.  She denies any diarrhea, fever, chills, upper abdominal pain.  Vomiting has been unchanged with ODT Zofran  at home.  She states that Phenergan  has worked for her in the past.   HPI     Prior to Admission medications   Medication Sig Start Date End Date Taking? Authorizing Provider  acetaminophen  (TYLENOL ) 500 MG tablet Take 1,000 mg by mouth 3 (three) times daily as needed (for pain or headaches).    [provider]  albuterol  (VENTOLIN  HFA) 108 (90 Base) MCG/ACT inhaler Inhale 2 puffs into the lungs every 4 (four) hours as needed for wheezing or shortness of breath. 10/15/22   Luke Orlan HERO, DO  BENADRYL  ALLERGY 25 MG capsule Take 25 mg by mouth every 6 (six) hours as needed for allergies or itching.    [provider]  bupivacaine ,PF, (MARCAINE ) 0.5 % SOLN injection Instill 15 mLs into bladder daily with heparin . 04/06/23      EPINEPHrine  0.3 mg/0.3 mL IJ SOAJ injection Inject 0.3 mg into the muscle as needed for anaphylaxis. 01/15/21   Luke Orlan HERO, DO  FLUoxetine  (PROZAC ) 20 MG capsule Take 3 capsules (60 mg total) by mouth daily. 07/22/23 11/12/23  Rothfuss, Jacob T, PA-C  guanFACINE (TENEX) 1 MG tablet Take 1 mg by mouth at bedtime.    [provider]  heparin  10000 UNIT/ML injection Instill 4 mLs (40,000 Units total) into the bladder daily with bupivacaine . 04/06/23     hydrALAZINE  (APRESOLINE ) 10 MG tablet Take 1 tablet (10 mg total) by mouth 3 (three) times daily. 10/01/23   Rothfuss, Jacob T, PA-C  hydrocortisone  (CORTEF ) 10 MG tablet Take 10 mg by mouth. 10mg  in the AM & 5 in PM    [provider]  hydrOXYzine  (ATARAX ) 25 MG tablet Take 50 mg by mouth at bedtime.    [provider]  irbesartan -hydrochlorothiazide  (AVALIDE) 150-12.5 MG tablet Take 1 tablet by mouth in the morning and at bedtime. 09/03/23 11/12/23  Rothfuss, Jacob T, PA-C  levothyroxine  (SYNTHROID ) 150 MCG tablet Take 1 tablet (150 mcg total) by mouth daily at 6 (six) AM. 10/06/22 11/12/23  Elnora Ip, MD  linaclotide  (LINZESS ) 145 MCG CAPS capsule Take 1 capsule (145 mcg total) by mouth daily before breakfast. 09/03/23   Rothfuss, Jacob T, PA-C  meloxicam (MOBIC) 7.5 MG tablet Take 7.5-15 mg by mouth daily as needed. 09/20/23   [provider]  NON FORMULARY Take 1 capsule by mouth See admin instructions. Beef organ capsules- Take 1 capsule by mouth once a day    [provider]  OLANZapine  (ZYPREXA ) 2.5 MG tablet Take 2.5 mg by mouth daily.    [provider]  OLANZapine  (ZYPREXA ) 7.5 MG tablet Take 7.5 mg by mouth at bedtime.    [provider]  ondansetron  (ZOFRAN -ODT) 4 MG disintegrating tablet Take 1 tablet (4 mg total) by mouth every 8 (eight) hours as needed for nausea or vomiting. 10/07/23   Sofia, Leslie K, PA-C  pantoprazole  (PROTONIX ) 40 MG tablet Take 1 tablet (40 mg total)  by mouth 2 (two) times daily as needed (for heartburn). 10/01/23   Rothfuss, Jacob T, PA-C  pregabalin  (LYRICA ) 25 MG capsule Take 1 capsule (25 mg total) by mouth 2 (two) times daily. 11/03/23   Rothfuss, Jacob T, PA-C  RASPBERRY PO Take 2 capsules by mouth See admin instructions. Raspberry Leaf capsules- Take 2 capsules by mouth once a day    [provider]  Syringe, Disposable, 20 ML MISC Use as directed for daily bladder instillations 04/06/23     traMADol  (ULTRAM ) 50 MG tablet Take 1 tablet (50 mg total) by mouth every 12 (twelve) hours as needed for moderate pain (pain score 4-6). 04/27/23   Ezenduka, Nkeiruka J, MD    Allergies: Bee venom, Coreg  [carvedilol ], Red dye #40 (allura red), Compazine  [prochlorperazine  maleate], Vicodin [hydrocodone -acetaminophen ], Cymbalta [duloxetine hcl], Ditropan  [oxybutynin ], Elavil [amitriptyline], Motrin  [ibuprofen ], Naprosyn  [naproxen ], Pyrimethamine, Sulfadoxine, Zestril  [lisinopril ], Nifedical xl [nifedipine ], Reglan  [metoclopramide ], and Trandate  [labetalol ]    Review of Systems  All other systems reviewed and are negative.   Updated Vital Signs BP 130/80   Pulse (!) 56   Temp 97.9 F (36.6 C) (Oral)   Resp 18   Ht 5' 11 (1.803 m)   Wt 113.9 kg   LMP 11/11/2023 (Approximate)   SpO2 98%   BMI 35.01 kg/m   Physical Exam Vitals and nursing note reviewed.  Constitutional:      General: She is not in acute distress.    Appearance: Normal appearance.  HENT:     Head: Normocephalic and atraumatic.  Eyes:     General:        Right eye: No discharge.        Left eye: No discharge.  Cardiovascular:     Comments: Regular rate and rhythm.  S1/S2 are distinct without any evidence of murmur, rubs, or gallops.  Radial pulses are 2+ bilaterally.  Dorsalis pedis pulses are 2+ bilaterally.  No evidence of pedal edema. Pulmonary:     Comments: Clear to auscultation bilaterally.  Normal effort.  No respiratory distress.  No evidence of wheezes,  rales, or rhonchi heard throughout. Abdominal:     General: Abdomen is flat. Bowel sounds are normal. There is no distension.     Tenderness: There is no guarding or rebound.     Comments: Lower abdominal tenderness.  Musculoskeletal:        General: Normal range of motion.     Cervical back: Neck supple.  Skin:    General: Skin is warm and dry.     Findings: No rash.  Neurological:     General: No focal deficit present.     Mental Status: She is alert.  Psychiatric:        Mood and Affect: Mood normal.        Behavior: Behavior normal.     (all labs  ordered are listed, but only abnormal results are displayed) Labs Reviewed  URINALYSIS, ROUTINE W REFLEX MICROSCOPIC - Abnormal; Notable for the following components:      Result Value   APPearance HAZY (*)    Hgb urine dipstick LARGE (*)    Bacteria, UA FEW (*)    All other components within normal limits  CBC WITH DIFFERENTIAL/PLATELET  BASIC METABOLIC PANEL WITH GFR  MAGNESIUM     EKG: None  Radiology: No results found.   Procedures   Medications Ordered in the ED  promethazine  (PHENERGAN ) injection 12.5 mg (12.5 mg Intramuscular Given 11/15/23 1244)  promethazine  (PHENERGAN ) 12.5 mg in sodium chloride  0.9 % 50 mL IVPB (0 mg Intravenous Stopped 11/15/23 1716)  sodium chloride  0.9 % bolus 1,000 mL (1,000 mLs Intravenous New Bag/Given 11/15/23 1539)  HYDROmorphone  (DILAUDID ) injection 1 mg (1 mg Intramuscular Given 11/15/23 1244)  HYDROmorphone  (DILAUDID ) injection 1 mg (1 mg Intramuscular Given 11/15/23 1448)  ketorolac  (TORADOL ) 15 MG/ML injection 15 mg (15 mg Intravenous Given 11/15/23 1615)  HYDROmorphone  (DILAUDID ) injection 1 mg (1 mg Intravenous Given 11/15/23 1722)    Clinical Course as of 11/15/23 1802  Sun Nov 15, 2023  1219 Urinalysis, Routine w reflex microscopic -Urine, Clean Catch(!) Some hematuria without signs of infection. [CF]  1219 CBC with Differential Negative. [CF]  1359 I was notified by nursing  staff that 2 of our nurses and IV team were also unsuccessful at getting the patient an IV.  Will attempt to orally hydrate.  The rest of the patient's labs have hemolyzed. [CF]  1715 On reevaluation, patient states that she is feeling better.  Will plan to give her 1 a round of Dilaudid  and likely discharge home.  She states that she is about back to her baseline is from her chronic pain perspective. [CF]  1801 Basic metabolic panel with GFR Normal. [CF]  1801 Magnesium  Normal. [CF]    Clinical Course User Index [CF] Theotis Cameron HERO, PA-C    Medical Decision Making CORABELLE SPACKMAN is a 33 y.o. female patient who presents to the emergency department today for further evaluation of chronic pelvic pain.  Do not feel that imaging is warranted at this time considering that this is a chronic problem and patient states this is nothing new as a relates to her chronic interstitial cystitis.  Will check some basic labs look for any electrolyte abnormalities the patient has been having intractable nausea and vomiting.  Will also give her some fluids as she is likely dehydrated and give her some pain medication.  I will plan to reassess after pain medication, labs result, and fluids.  She is overall nontoxic-appearing at this time.  Blood pressure slightly elevated but this could be related to pain.  Patient's pain was adequately controlled after several rounds of narcotic pain medication.  Labs are all reassuring.  She has had no vomiting since been here and tolerated fluid challenge.  Will plan to discharge home with follow-up with her primary care doctor and/or urologist.  She does have a referral for chronic pain management already.  Will not prescribe narcotics today.  Strict return precautions were discussed.  She is safe for discharge.  Amount and/or Complexity of Data Reviewed Labs: ordered. Decision-making details documented in ED Course.  Risk Prescription drug management.     Final  diagnoses:  Chronic pelvic pain in female    ED Discharge Orders     None          Theotis,  Cameron HERO, PA-C 11/15/23 1802    Neysa Caron PARAS, DO 11/19/23 [phone]

## 2023-11-15 NOTE — ED Notes (Signed)
 Unsuccessful IV attempt x2.

## 2023-11-15 NOTE — ED Notes (Signed)
Unsuccessful IV attempt.

## 2023-11-15 NOTE — Progress Notes (Signed)
 IV Team rec'd consult for IV access;  right arm assessed thoroughly with ultrasound; no healthy veins noted; attempted x 1 in left ac, also unsuccessful;  suggest central access;  RN aware.

## 2023-11-15 NOTE — ED Notes (Signed)
 IV team unable to establish IV, EDP made aware.

## 2023-11-15 NOTE — ED Triage Notes (Signed)
 Arrived POV from home. Patient reports bladder pain(Pelvic) X5 days. Patient reports that she has been referred to pain clinic by PCP, waiting for appointment. Reports pelvic pain 8/10 that radiates into vagina. A&OX4, NAD.

## 2023-11-15 NOTE — Discharge Instructions (Signed)
 Please follow-up with your primary care doctor and/or urologist for this.  Please keep your appointment for your chronic pain management doctor.  You can return to the emergency department for any worsening symptoms.

## 2023-11-16 ENCOUNTER — Ambulatory Visit: Payer: Self-pay

## 2023-11-16 NOTE — Telephone Encounter (Signed)
 Called [facility] and advised them of patient's situation and symptoms.

## 2023-11-16 NOTE — Telephone Encounter (Signed)
 Severe pelvic pain Went to the ER yesterday Pain came back this morning around 5:30am Patient unable to go to the ER due to not having anyone to watch her children at this time Patient wanted medication for pain until she can get in to see pain management (referred by urology to pain management)  FYI Only or Action Required?: Action required by provider: clinical question for provider and update on patient condition.  Patient was last seen in primary care on 11/12/2023 by Rothfuss, Lang DASEN, PA-C.  Called Nurse Triage reporting Abdominal Pain.  Symptoms began this morning at 5:30am.  Interventions attempted: Rest, hydration, or home remedies.  Symptoms are: rapidly worsening.  Triage Disposition: Go to ED Now (Notify PCP)  Patient/caregiver understands and will follow disposition?: No, refuses disposition                  Copied from CRM #[id number]. Topic: Clinical - Red Word Triage >> Nov 16, 2023  2:08 PM Wess RAMAN wrote: Red Word that prompted transfer to Nurse Triage: Patient went to the hospital for bladder pain. Pain level is 8. Patient would like pain medicine for until she sees pain management. Reason for Disposition  [1] SEVERE pelvic pain AND [2] present > 1 hour  Answer Assessment - Initial Assessment Questions Saw urologist Thursday ---sent a referral to pain management---patient has not heard from them yet Went to the ED yesterday Woke up this morning around 5:30am ---Tramadol  not helping Patient sounds very tearful during phone triage Emergency Room is advised at this time for her severe pain. Patient states she cannot go to the ER at this time due to not having anybody that can watch her children She is advised that if she gets worse to call 911 She verbalized understanding at this time     1. LOCATION: Where does it hurt?      Pelvic area 2. RADIATION: Does the pain shoot anywhere else? (e.g., lower back, groin, thighs)     ---- 3.  ONSET: When did the pain begin? (e.g., minutes, hours or days ago)      5:30am this morning 4. SUDDEN: Gradual or sudden onset?     ------ 5. PATTERN Does the pain come and go, or is it constant?     Constant since 5:30 this morning when patient woke up 6. SEVERITY: How bad is the pain?  (e.g., Scale 1-10; mild, moderate, or severe)     severe 7. RECURRENT SYMPTOM: Have you ever had this type of pelvic pain before? If Yes, ask: When was the last time? and What happened that time?      Yes went to the ED yesterday 8. CAUSE: What do you think is causing the pelvic pain?     Patient sees a urologist about this 9. RELIEVING/AGGRAVATING FACTORS: What makes it better or worse? (e.g., activity/rest, sexual intercourse, voiding, passing stool)     ------- 10. OTHER SYMPTOMS: Has there been any other symptoms? (e.g., fever, constipation, diarrhea, urine problems, vaginal bleeding, vaginal discharge, or vomiting?       Nausea, vomiting, diarrhea 11. PREGNANCY: Is there any chance you are pregnant? When was your last menstrual period?       No  Protocols used: Pelvic Pain - Female-A-AH

## 2023-11-18 ENCOUNTER — Encounter: Payer: Self-pay | Admitting: Gastroenterology

## 2023-11-18 ENCOUNTER — Encounter (HOSPITAL_BASED_OUTPATIENT_CLINIC_OR_DEPARTMENT_OTHER): Payer: Self-pay

## 2023-11-18 NOTE — Telephone Encounter (Signed)
 There is also a mychart message going on about this.  I am closing this encounter.

## 2023-11-19 ENCOUNTER — Encounter (HOSPITAL_BASED_OUTPATIENT_CLINIC_OR_DEPARTMENT_OTHER): Payer: Self-pay | Admitting: Student

## 2023-12-03 ENCOUNTER — Emergency Department (HOSPITAL_COMMUNITY)
Admission: EM | Admit: 2023-12-03 | Discharge: 2023-12-03 | Disposition: A | Discharge status: Home / Self Care | Payer: MEDICAID

## 2023-12-03 ENCOUNTER — Encounter (HOSPITAL_COMMUNITY): Payer: Self-pay

## 2023-12-03 ENCOUNTER — Emergency Department (HOSPITAL_COMMUNITY): Payer: MEDICAID

## 2023-12-03 ENCOUNTER — Other Ambulatory Visit: Payer: Self-pay

## 2023-12-03 DIAGNOSIS — R109 Unspecified abdominal pain: Secondary | ICD-10-CM

## 2023-12-03 DIAGNOSIS — E860 Dehydration: Secondary | ICD-10-CM | POA: Insufficient documentation

## 2023-12-03 DIAGNOSIS — N3010 Interstitial cystitis (chronic) without hematuria: Secondary | ICD-10-CM | POA: Diagnosis not present

## 2023-12-03 DIAGNOSIS — R1031 Right lower quadrant pain: Secondary | ICD-10-CM | POA: Diagnosis present

## 2023-12-03 LAB — CBC WITH DIFFERENTIAL/PLATELET
Abs Immature Granulocytes: 0.03 K/uL (ref 0.00–0.07)
Basophils Absolute: 0 K/uL (ref 0.0–0.1)
Basophils Relative: 1 %
Eosinophils Absolute: 0 K/uL (ref 0.0–0.5)
Eosinophils Relative: 0 %
HCT: 48.4 % — ABNORMAL HIGH (ref 36.0–46.0)
Hemoglobin: 15.9 g/dL — ABNORMAL HIGH (ref 12.0–15.0)
Immature Granulocytes: 0 %
Lymphocytes Relative: 28 %
Lymphs Abs: 2.1 K/uL (ref 0.7–4.0)
MCH: 31 pg (ref 26.0–34.0)
MCHC: 32.9 g/dL (ref 30.0–36.0)
MCV: 94.3 fL (ref 80.0–100.0)
Monocytes Absolute: 0.8 K/uL (ref 0.1–1.0)
Monocytes Relative: 10 %
Neutro Abs: 4.6 K/uL (ref 1.7–7.7)
Neutrophils Relative %: 61 %
Platelets: 315 K/uL (ref 150–400)
RBC: 5.13 MIL/uL — ABNORMAL HIGH (ref 3.87–5.11)
RDW: 13.2 % (ref 11.5–15.5)
WBC: 7.5 K/uL (ref 4.0–10.5)
nRBC: 0 % (ref 0.0–0.2)

## 2023-12-03 LAB — COMPREHENSIVE METABOLIC PANEL WITH GFR
ALT: 32 U/L (ref 0–44)
AST: 25 U/L (ref 15–41)
Albumin: 5.1 g/dL — ABNORMAL HIGH (ref 3.5–5.0)
Alkaline Phosphatase: 68 U/L (ref 38–126)
Anion gap: 11 (ref 5–15)
BUN: 18 mg/dL (ref 6–20)
CO2: 26 mmol/L (ref 22–32)
Calcium: 10.4 mg/dL — ABNORMAL HIGH (ref 8.9–10.3)
Chloride: 100 mmol/L (ref 98–111)
Creatinine, Ser: 1.03 mg/dL — ABNORMAL HIGH (ref 0.44–1.00)
GFR, Estimated: >60 mL/min (ref >60)
Glucose, Bld: 100 mg/dL — ABNORMAL HIGH (ref 70–99)
Potassium: 4.7 mmol/L (ref 3.5–5.1)
Sodium: 137 mmol/L (ref 135–145)
Total Bilirubin: 0.4 mg/dL (ref 0.0–1.2)
Total Protein: 8.3 g/dL — ABNORMAL HIGH (ref 6.5–8.1)

## 2023-12-03 LAB — URINALYSIS, ROUTINE W REFLEX MICROSCOPIC
Bilirubin Urine: NEGATIVE
Glucose, UA: NEGATIVE mg/dL
Hgb urine dipstick: NEGATIVE
Ketones, ur: NEGATIVE mg/dL
Leukocytes,Ua: NEGATIVE
Nitrite: NEGATIVE
Protein, ur: NEGATIVE mg/dL
Specific Gravity, Urine: 1.012 (ref 1.005–1.030)
pH: 7 (ref 5.0–8.0)

## 2023-12-03 LAB — LIPASE, BLOOD: Lipase: 21 U/L (ref 11–51)

## 2023-12-03 LAB — HCG, SERUM, QUALITATIVE: Preg, Serum: NEGATIVE

## 2023-12-03 MED ORDER — DROPERIDOL 2.5 MG/ML IJ SOLN
2.5000 mg | Freq: Once | INTRAMUSCULAR | Status: AC
  Administered 2023-12-03: 2.5 mg via INTRAVENOUS
  Filled 2023-12-03: qty 2

## 2023-12-03 MED ORDER — LACTATED RINGERS IV BOLUS
1000.0000 mL | Freq: Once | INTRAVENOUS | Status: AC
  Administered 2023-12-03: 1000 mL via INTRAVENOUS

## 2023-12-03 MED ORDER — ONDANSETRON HCL 4 MG/2ML IJ SOLN
4.0000 mg | Freq: Once | INTRAMUSCULAR | Status: AC
  Administered 2023-12-03: 4 mg via INTRAVENOUS
  Filled 2023-12-03: qty 2

## 2023-12-03 MED ORDER — TRAMADOL HCL 50 MG PO TABS
50.0000 mg | ORAL_TABLET | Freq: Once | ORAL | Status: AC
  Administered 2023-12-03: 50 mg via ORAL
  Filled 2023-12-03: qty 1

## 2023-12-03 MED ORDER — HYDROMORPHONE HCL 1 MG/ML IJ SOLN
1.0000 mg | Freq: Once | INTRAMUSCULAR | Status: AC
  Administered 2023-12-03: 1 mg via INTRAVENOUS
  Filled 2023-12-03: qty 1

## 2023-12-03 MED ORDER — ONDANSETRON 4 MG PO TBDP: 4.0000 mg | ORAL_TABLET | Freq: Three times a day (TID) | ORAL | 0 refills | Status: DC | PRN

## 2023-12-03 NOTE — ED Provider Notes (Signed)
 4:04 PM Assumed care of patient from off-going team. For more details, please see note from same day.  In brief, this is a 33 y.o. female p/w chronic interstitial cystitis, cyclic vomiting who p/w abd pain nausea/vomiting. S/p droperidol  and failed PO challenge.   Plan/Dispo at time of sign-out & ED Course since sign-out: [ ]  CT abd pelvis, repeat pain meds and reeval/PO challenge  BP (!) 160/95 (BP Location: Right Arm)   Pulse 64   Temp 98 F (36.7 C) (Oral)   Resp 18   Ht 5' 11 (1.803 m)   Wt 113.4 kg   LMP 11/11/2023 (Approximate)   SpO2 100%   BMI 34.87 kg/m    ED Course:   Clinical Course as of 12/03/23 2142  Thu Dec 03, 2023  1505 Patient labs reassuring.  Does not appear to have leukocytosis to suggest systemic infection.  Pregnancy test is negative.  Ectopic pregnancy unlikely.  Comprehensive panel appears to have no metabolic derangements.  Near baseline renal function.  No transaminitis to suggest hepatobiliary disease.  Lipase normal.  Pancreatitis unlikely.  Patient did seemingly have improvement after Dilaudid  and Zofran  and was sleeping in her bed.  Awoke and complained of more pain and was given tramadol , but notes she did not keep it down and vomited again.  She has had 5 negative CT scans this year alone.  Droperidol  ordered. [TY]  [id number] Patient refuses the CT scan stating that this feels like her normal bladder pain and that she doesn't want the CT scan as she is claustrophobic. She understands that there could be an acute intraabdominal process that goes unidentified but reports that it feels like her normal pain. She reports that the only thing that has helped her today was the 1 mg IV dilaudid  she received earlier, which relieved her pain until she needed to urinate again and then her pain started back up. She requests more nausea medicine and dilaudid  and will try PO again.  [HN]  2110 Patient reevaluated.  She passed her p.o. challenge and would like to be discharged.  [HN]    Clinical Course User Index [HN] Franklyn Sid SAILOR, MD [TY] Neysa Caron PARAS, DO   Patient instructed to follow-up with her urologist Dr. Janit at [facility].  She states she will call his office tomorrow morning to make a follow-up appointment.  She states that she was referred to pain management and has an appointment on 12/09/2023.  I encouraged her to keep that appointment.  Encouraged her to take her home tramadol  in the meantime.  Refilled her Zofran  for nausea vomiting.  Given specific discharge instructions and return precautions, all questions answered to patient satisfaction.  Dispo: DC ------------------------------- Sid Franklyn, MD Emergency Medicine  This note was created using dictation software, which may contain spelling or grammatical errors.   Franklyn Sid SAILOR, MD 12/03/23 [phone]

## 2023-12-03 NOTE — ED Notes (Signed)
 Pt needs Ultrasound IV to obtain labs

## 2023-12-03 NOTE — ED Triage Notes (Signed)
 Pt reports with lower abdominal pain and n/v since last night.

## 2023-12-03 NOTE — ED Notes (Signed)
 PO challenge complete, Pt drank 4 oz of water, reports no N/V

## 2023-12-03 NOTE — ED Provider Notes (Signed)
 [place] EM[facility]GENCY DEPARTMENT AT [facility] Provider Note   CSN: [id number] Arrival date & time: 12/03/23  1000     Patient presents with: Abdominal Pain   Krystal [name] is a 33 y.o. female.   33 year old female presenting emergency department with lower abdominal pain nausea vomiting.  Has a history of cyclical chronic abdominal pain with nausea vomiting.  Reports has not been to keep her medications down.  Takes tramadol  for pain.  Diagnosed with interstitial cystitis, prior kidney stones.  Reports that her symptoms are typical of her chronic pain and are unchanged.   Abdominal Pain      Prior to Admission medications   Medication Sig Start Date End Date Taking? Authorizing Provider  acetaminophen  (TYLENOL ) 500 MG tablet Take 1,000 mg by mouth 3 (three) times daily as needed (for pain or headaches).    [provider]  albuterol  (VENTOLIN  HFA) 108 (90 Base) MCG/ACT inhaler Inhale 2 puffs into the lungs every 4 (four) hours as needed for wheezing or shortness of breath. 10/15/22   Luke Orlan HERO, DO  BENADRYL  ALLERGY 25 MG capsule Take 25 mg by mouth every 6 (six) hours as needed for allergies or itching.    [provider]  bupivacaine ,PF, (MARCAINE ) 0.5 % SOLN injection Instill 15 mLs into bladder daily with heparin . 04/06/23     EPINEPHrine  0.3 mg/0.3 mL IJ SOAJ injection Inject 0.3 mg into the muscle as needed for anaphylaxis. 01/15/21   Luke Orlan HERO, DO  FLUoxetine  (PROZAC ) 20 MG capsule Take 3 capsules (60 mg total) by mouth daily. 07/22/23 11/12/23  Rothfuss, Jacob T, PA-C  guanFACINE (TENEX) 1 MG tablet Take 1 mg by mouth at bedtime.    [provider]  heparin  10000 UNIT/ML injection Instill 4 mLs (40,000 Units total) into the bladder daily with bupivacaine . 04/06/23     hydrALAZINE  (APRESOLINE ) 10 MG tablet Take 1 tablet (10 mg total) by mouth 3 (three) times daily. 10/01/23   Rothfuss, Jacob T, PA-C  hydrocortisone  (CORTEF ) 10 MG tablet  Take 10 mg by mouth. 10mg  in the AM & 5 in PM    [provider]  hydrOXYzine  (ATARAX ) 25 MG tablet Take 50 mg by mouth at bedtime.    [provider]  irbesartan -hydrochlorothiazide  (AVALIDE) 150-12.5 MG tablet Take 1 tablet by mouth in the morning and at bedtime. 09/03/23 11/12/23  Rothfuss, Jacob T, PA-C  levothyroxine  (SYNTHROID ) 150 MCG tablet Take 1 tablet (150 mcg total) by mouth daily at 6 (six) AM. 10/06/22 11/12/23  Elnora Ip, MD  linaclotide  (LINZESS ) 145 MCG CAPS capsule Take 1 capsule (145 mcg total) by mouth daily before breakfast. 09/03/23   Rothfuss, Jacob T, PA-C  meloxicam (MOBIC) 7.5 MG tablet Take 7.5-15 mg by mouth daily as needed. 09/20/23   [provider]  NON FORMULARY Take 1 capsule by mouth See admin instructions. Beef organ capsules- Take 1 capsule by mouth once a day    [provider]  OLANZapine  (ZYPREXA ) 2.5 MG tablet Take 2.5 mg by mouth daily.    [provider]  OLANZapine  (ZYPREXA ) 7.5 MG tablet Take 7.5 mg by mouth at bedtime.    [provider]  ondansetron  (ZOFRAN -ODT) 4 MG disintegrating tablet Take 1 tablet (4 mg total) by mouth every 8 (eight) hours as needed for nausea or vomiting. 10/07/23   Sofia, Leslie K, PA-C  pantoprazole  (PROTONIX ) 40 MG tablet Take 1 tablet (40 mg total) by mouth 2 (two) times daily as  needed (for heartburn). 10/01/23   Rothfuss, Jacob T, PA-C  pregabalin  (LYRICA ) 25 MG capsule Take 1 capsule (25 mg total) by mouth 2 (two) times daily. 11/03/23   Rothfuss, Jacob T, PA-C  RASPBERRY PO Take 2 capsules by mouth See admin instructions. Raspberry Leaf capsules- Take 2 capsules by mouth once a day    [provider]  Syringe, Disposable, 20 ML MISC Use as directed for daily bladder instillations 04/06/23     traMADol  (ULTRAM ) 50 MG tablet Take 1 tablet (50 mg total) by mouth every 12 (twelve) hours as needed for moderate pain (pain score 4-6). 04/27/23   Ezenduka, Nkeiruka J, MD     Allergies: Bee venom, Coreg  [carvedilol ], Red dye #40 (allura red), Compazine  [prochlorperazine  maleate], Vicodin [hydrocodone -acetaminophen ], Cymbalta [duloxetine hcl], Ditropan  [oxybutynin ], Elavil [amitriptyline], Motrin  [ibuprofen ], Naprosyn  [naproxen ], Pyrimethamine, Sulfadoxine, Zestril  [lisinopril ], Nifedical xl [nifedipine ], Reglan  [metoclopramide ], and Trandate  [labetalol ]    Review of Systems  Gastrointestinal:  Positive for abdominal pain.    Updated Vital Signs BP (!) 160/95 (BP Location: Right Arm)   Pulse 64   Temp 98 F (36.7 C) (Oral)   Resp 18   Ht 5' 11 (1.803 m)   Wt 113.4 kg   LMP 11/11/2023 (Approximate)   SpO2 100%   BMI 34.87 kg/m   Physical Exam Vitals and nursing note reviewed.  Constitutional:      General: She is not in acute distress. HENT:     Head: Normocephalic.  Cardiovascular:     Rate and Rhythm: Normal rate and regular rhythm.  Pulmonary:     Effort: Pulmonary effort is normal.     Breath sounds: Normal breath sounds.  Abdominal:     General: Abdomen is flat.     Tenderness: There is abdominal tenderness in the right lower quadrant, periumbilical area, suprapubic area and left lower quadrant. There is no guarding.  Skin:    General: Skin is warm and dry.  Neurological:     General: No focal deficit present.     Mental Status: She is alert.  Psychiatric:        Mood and Affect: Mood normal.        Behavior: Behavior normal.     (all labs ordered are listed, but only abnormal results are displayed) Labs Reviewed  COMPREHENSIVE METABOLIC PANEL WITH GFR - Abnormal; Notable for the following components:      Result Value   Glucose, Bld 100 (*)    Creatinine, Ser 1.03 (*)    Calcium  10.4 (*)    Total Protein 8.3 (*)    Albumin 5.1 (*)    All other components within normal limits  CBC WITH DIFFERENTIAL/PLATELET - Abnormal; Notable for the following components:   RBC 5.13 (*)    Hemoglobin 15.9 (*)    HCT 48.4 (*)    All  other components within normal limits  LIPASE, BLOOD  URINALYSIS, ROUTINE W REFLEX MICROSCOPIC  HCG, SERUM, QUALITATIVE    EKG: None  Radiology: No results found.   Procedures   Medications Ordered in the ED  HYDROmorphone  (DILAUDID ) injection 1 mg (1 mg Intravenous Given 12/03/23 1137)  ondansetron  (ZOFRAN ) injection 4 mg (4 mg Intravenous Given 12/03/23 1137)  traMADol  (ULTRAM ) tablet 50 mg (50 mg Oral Given 12/03/23 1425)  droperidol  (INAPSINE ) 2.5 MG/ML injection 2.5 mg (2.5 mg Intravenous Given 12/03/23 1513)    Clinical Course as of 12/03/23 1548  Thu Dec 03, 2023  1505 Patient labs reassuring.  Does not appear  to have leukocytosis to suggest systemic infection.  Pregnancy test is negative.  Ectopic pregnancy unlikely.  Comprehensive panel appears to have no metabolic derangements.  Near baseline renal function.  No transaminitis to suggest hepatobiliary disease.  Lipase normal.  Pancreatitis unlikely.  Patient did seemingly have improvement after Dilaudid  and Zofran  and was sleeping in her bed.  Awoke and complained of more pain and was given tramadol , but notes she did not keep it down and vomited again.  She has had 5 negative CT scans this year alone.  Droperidol  ordered. [TY]    Clinical Course User Index [TY] Neysa Caron PARAS, DO                                 Medical Decision Making This is a 33 year old female presenting emergency department for lower abdominal pain.  She is afebrile, nontachycardic, slightly hypertensive.  Physical exam with some minor lower abdominal tenderness.  Per chart review has had 5 negative CT scans in the past year.  Her symptoms are essentially unchanged compared to prior per her report.  Trial of pain control and will check labs.  Will defer CTA abdomen at this time unless significant abnormalities or unable to tolerate p.o.  Care signed out to afternoon team, disposition pending reevaluation/p.o. challenge  Amount and/or Complexity of Data  Reviewed External Data Reviewed:     Details: Numerous ED visits for similar presentation Labs: ordered. Radiology:     Details: Considered CT scan, however given unchanged chronic presentation will defer CT scan as the risk of radiation does not outweigh the benefit as I have low suspicion for acute intra-abdominal pathology at this time.  Risk Prescription drug management. Parenteral controlled substances. Decision regarding hospitalization. Diagnosis or treatment significantly limited by social determinants of health.       Final diagnoses:  None    ED Discharge Orders     None          Neysa Caron PARAS, DO 12/03/23 1548

## 2023-12-03 NOTE — Discharge Instructions (Addendum)
 Thank you for coming to [facility] Emergency Department. You were seen for abdominal pain nausea and vomiting. We did an exam, labs, and imaging, and these showed no acute findings.  Please stay well-hydrated at home.  We have refilled your Zofran  prescription, you can take 4 mg under the tongue every 6 8 hours as needed for nausea and vomiting. Please follow up with your primary care provider within 1 week.   Do not hesitate to return to the ED or call 911 if you experience: -Worsening symptoms -Nausea/vomiting so severe you cannot eat, drink, or take your medications -Lightheadedness, passing out -Fevers/chills -Anything else that concerns you

## 2023-12-07 ENCOUNTER — Encounter (HOSPITAL_COMMUNITY): Payer: Self-pay

## 2023-12-07 ENCOUNTER — Inpatient Hospital Stay (HOSPITAL_COMMUNITY)
Admission: EM | Admit: 2023-12-07 | Discharge: 2023-12-09 | DRG: 690 | Disposition: A | Discharge status: Home / Self Care | Payer: MEDICAID | Attending: Family Medicine | Admitting: Family Medicine

## 2023-12-07 ENCOUNTER — Other Ambulatory Visit: Payer: Self-pay

## 2023-12-07 DIAGNOSIS — Z91030 Bee allergy status: Secondary | ICD-10-CM

## 2023-12-07 DIAGNOSIS — Z79899 Other long term (current) drug therapy: Secondary | ICD-10-CM

## 2023-12-07 DIAGNOSIS — E2740 Unspecified adrenocortical insufficiency: Secondary | ICD-10-CM | POA: Diagnosis present

## 2023-12-07 DIAGNOSIS — R1110 Vomiting, unspecified: Secondary | ICD-10-CM | POA: Diagnosis present

## 2023-12-07 DIAGNOSIS — E66812 Obesity, class 2: Secondary | ICD-10-CM | POA: Diagnosis present

## 2023-12-07 DIAGNOSIS — Z885 Allergy status to narcotic agent status: Secondary | ICD-10-CM

## 2023-12-07 DIAGNOSIS — F32A Depression, unspecified: Secondary | ICD-10-CM | POA: Diagnosis present

## 2023-12-07 DIAGNOSIS — K581 Irritable bowel syndrome with constipation: Secondary | ICD-10-CM | POA: Diagnosis present

## 2023-12-07 DIAGNOSIS — Z79890 Hormone replacement therapy (postmenopausal): Secondary | ICD-10-CM

## 2023-12-07 DIAGNOSIS — R112 Nausea with vomiting, unspecified: Secondary | ICD-10-CM | POA: Diagnosis present

## 2023-12-07 DIAGNOSIS — Z89112 Acquired absence of left hand: Secondary | ICD-10-CM

## 2023-12-07 DIAGNOSIS — Z6834 Body mass index (BMI) 34.0-34.9, adult: Secondary | ICD-10-CM

## 2023-12-07 DIAGNOSIS — Z9151 Personal history of suicidal behavior: Secondary | ICD-10-CM

## 2023-12-07 DIAGNOSIS — Z23 Encounter for immunization: Secondary | ICD-10-CM

## 2023-12-07 DIAGNOSIS — F419 Anxiety disorder, unspecified: Secondary | ICD-10-CM | POA: Diagnosis present

## 2023-12-07 DIAGNOSIS — Z888 Allergy status to other drugs, medicaments and biological substances status: Secondary | ICD-10-CM

## 2023-12-07 DIAGNOSIS — I1A0 Resistant hypertension: Secondary | ICD-10-CM | POA: Diagnosis present

## 2023-12-07 DIAGNOSIS — Z9102 Food additives allergy status: Secondary | ICD-10-CM

## 2023-12-07 DIAGNOSIS — Z9049 Acquired absence of other specified parts of digestive tract: Secondary | ICD-10-CM

## 2023-12-07 DIAGNOSIS — Z886 Allergy status to analgesic agent status: Secondary | ICD-10-CM

## 2023-12-07 DIAGNOSIS — R1085 Abdominal pain of multiple sites: Principal | ICD-10-CM

## 2023-12-07 DIAGNOSIS — N3010 Interstitial cystitis (chronic) without hematuria: Principal | ICD-10-CM | POA: Diagnosis present

## 2023-12-07 DIAGNOSIS — Z791 Long term (current) use of non-steroidal anti-inflammatories (NSAID): Secondary | ICD-10-CM

## 2023-12-07 DIAGNOSIS — Z7952 Long term (current) use of systemic steroids: Secondary | ICD-10-CM

## 2023-12-07 LAB — COMPREHENSIVE METABOLIC PANEL WITH GFR
ALT: 81 U/L — ABNORMAL HIGH (ref 0–44)
AST: 58 U/L — ABNORMAL HIGH (ref 15–41)
Albumin: 4.6 g/dL (ref 3.5–5.0)
Alkaline Phosphatase: 55 U/L (ref 38–126)
Anion gap: 10 (ref 5–15)
BUN: 24 mg/dL — ABNORMAL HIGH (ref 6–20)
CO2: 26 mmol/L (ref 22–32)
Calcium: 9.6 mg/dL (ref 8.9–10.3)
Chloride: 102 mmol/L (ref 98–111)
Creatinine, Ser: 1.03 mg/dL — ABNORMAL HIGH (ref 0.44–1.00)
GFR, Estimated: >60 mL/min (ref >60)
Glucose, Bld: 94 mg/dL (ref 70–99)
Potassium: 3.9 mmol/L (ref 3.5–5.1)
Sodium: 139 mmol/L (ref 135–145)
Total Bilirubin: 0.5 mg/dL (ref 0.0–1.2)
Total Protein: 7.4 g/dL (ref 6.5–8.1)

## 2023-12-07 LAB — CBC
HCT: 44.7 % (ref 36.0–46.0)
Hemoglobin: 14.4 g/dL (ref 12.0–15.0)
MCH: 30.1 pg (ref 26.0–34.0)
MCHC: 32.2 g/dL (ref 30.0–36.0)
MCV: 93.3 fL (ref 80.0–100.0)
Platelets: 295 K/uL (ref 150–400)
RBC: 4.79 MIL/uL (ref 3.87–5.11)
RDW: 13.2 % (ref 11.5–15.5)
WBC: 7 K/uL (ref 4.0–10.5)
nRBC: 0 % (ref 0.0–0.2)

## 2023-12-07 LAB — URINALYSIS, ROUTINE W REFLEX MICROSCOPIC
Bacteria, UA: NONE SEEN
Bilirubin Urine: NEGATIVE
Glucose, UA: NEGATIVE mg/dL
Ketones, ur: NEGATIVE mg/dL
Leukocytes,Ua: NEGATIVE
Nitrite: NEGATIVE
Protein, ur: NEGATIVE mg/dL
Specific Gravity, Urine: 1.025 (ref 1.005–1.030)
pH: 6 (ref 5.0–8.0)

## 2023-12-07 LAB — LIPASE, BLOOD: Lipase: 19 U/L (ref 11–51)

## 2023-12-07 LAB — HCG, SERUM, QUALITATIVE: Preg, Serum: NEGATIVE

## 2023-12-07 MED ORDER — SODIUM CHLORIDE 0.9 % IV SOLN
12.5000 mg | Freq: Four times a day (QID) | INTRAVENOUS | Status: DC | PRN
  Administered 2023-12-07 – 2023-12-08 (×2): 12.5 mg via INTRAVENOUS
  Filled 2023-12-07: qty 12.5
  Filled 2023-12-07: qty 0.5
  Filled 2023-12-07: qty 12.5

## 2023-12-07 MED ORDER — OXYCODONE HCL 5 MG PO TABS
5.0000 mg | ORAL_TABLET | Freq: Once | ORAL | Status: AC
  Administered 2023-12-07: 5 mg via ORAL
  Filled 2023-12-07: qty 1

## 2023-12-07 MED ORDER — LACTATED RINGERS IV SOLN: INTRAVENOUS | Status: AC

## 2023-12-07 MED ORDER — MORPHINE SULFATE (PF) 2 MG/ML IV SOLN
2.0000 mg | INTRAVENOUS | Status: DC | PRN
  Administered 2023-12-07 – 2023-12-08 (×7): 2 mg via INTRAVENOUS
  Filled 2023-12-07 (×7): qty 1

## 2023-12-07 MED ORDER — PANTOPRAZOLE SODIUM 40 MG IV SOLR
40.0000 mg | INTRAVENOUS | Status: DC
  Administered 2023-12-07 – 2023-12-08 (×2): 40 mg via INTRAVENOUS
  Filled 2023-12-07 (×2): qty 10

## 2023-12-07 MED ORDER — ALBUTEROL SULFATE (2.5 MG/3ML) 0.083% IN NEBU: 2.5000 mg | INHALATION_SOLUTION | RESPIRATORY_TRACT | Status: DC | PRN

## 2023-12-07 MED ORDER — INFLUENZA VIRUS VACC SPLIT PF (FLUZONE) 0.5 ML IM SUSY: 0.5000 mL | PREFILLED_SYRINGE | INTRAMUSCULAR | Status: DC

## 2023-12-07 MED ORDER — MORPHINE SULFATE (PF) 2 MG/ML IV SOLN
2.0000 mg | Freq: Once | INTRAVENOUS | Status: AC
  Administered 2023-12-07: 2 mg via INTRAVENOUS
  Filled 2023-12-07: qty 1

## 2023-12-07 MED ORDER — OLANZAPINE 5 MG PO TABS
7.5000 mg | ORAL_TABLET | Freq: Once | ORAL | Status: AC
  Administered 2023-12-07: 7.5 mg via ORAL
  Filled 2023-12-07: qty 2

## 2023-12-07 MED ORDER — HALOPERIDOL LACTATE 5 MG/ML IJ SOLN
2.0000 mg | Freq: Once | INTRAMUSCULAR | Status: DC
  Filled 2023-12-07: qty 1

## 2023-12-07 MED ORDER — ENOXAPARIN SODIUM 40 MG/0.4ML IJ SOSY
40.0000 mg | PREFILLED_SYRINGE | INTRAMUSCULAR | Status: DC
Start: 2023-12-07 — End: 2023-12-09
  Administered 2023-12-07 – 2023-12-08 (×2): 40 mg via SUBCUTANEOUS
  Filled 2023-12-07 (×2): qty 0.4

## 2023-12-07 MED ORDER — HYDROCORTISONE SOD SUC (PF) 100 MG IJ SOLR
10.0000 mg | Freq: Two times a day (BID) | INTRAMUSCULAR | Status: DC
  Administered 2023-12-07 – 2023-12-09 (×4): 10 mg via INTRAVENOUS
  Filled 2023-12-07 (×4): qty 2

## 2023-12-07 MED ORDER — ENSURE PLUS HIGH PROTEIN PO LIQD
237.0000 mL | Freq: Two times a day (BID) | ORAL | Status: DC
Start: 2023-12-08 — End: 2023-12-09
  Administered 2023-12-09: 237 mL via ORAL

## 2023-12-07 MED ORDER — KETOROLAC TROMETHAMINE 15 MG/ML IJ SOLN
15.0000 mg | Freq: Once | INTRAMUSCULAR | Status: AC
  Administered 2023-12-07: 15 mg via INTRAVENOUS
  Filled 2023-12-07: qty 1

## 2023-12-07 MED ORDER — ONDANSETRON HCL 4 MG/2ML IJ SOLN
4.0000 mg | Freq: Once | INTRAMUSCULAR | Status: AC
  Administered 2023-12-07: 4 mg via INTRAVENOUS
  Filled 2023-12-07: qty 2

## 2023-12-07 MED ORDER — ONDANSETRON HCL 4 MG/2ML IJ SOLN
4.0000 mg | Freq: Four times a day (QID) | INTRAMUSCULAR | Status: DC | PRN
  Administered 2023-12-07 – 2023-12-09 (×4): 4 mg via INTRAVENOUS
  Filled 2023-12-07 (×4): qty 2

## 2023-12-07 MED ORDER — HYDRALAZINE HCL 20 MG/ML IJ SOLN: 5.0000 mg | Freq: Four times a day (QID) | INTRAMUSCULAR | Status: DC | PRN

## 2023-12-07 MED ORDER — SODIUM CHLORIDE 0.9 % IV BOLUS
1000.0000 mL | Freq: Once | INTRAVENOUS | Status: AC
  Administered 2023-12-07: 1000 mL via INTRAVENOUS

## 2023-12-07 NOTE — ED Triage Notes (Signed)
 Patient has had lower abdominal pain for 1 week. Stated she now has lower back pain too. Has been vomiting but no diarrhea.

## 2023-12-07 NOTE — Discharge Instructions (Addendum)
 Your workup today was unremarkable for acute pathology.  Your urine was clear.  You had no large electrolyte derangements.  As we discussed, I do not think a CT scan would benefit us  as an situation given you have had multiple CT scans for the same complaints without any kind of clear diagnosis.   I do  think it is reasonable to follow-up with gastroenterology.  Have any kind of fever or chills or if you start throwing up blood then please come back to the ED for further evaluation.

## 2023-12-07 NOTE — ED Provider Notes (Signed)
 [place] EMERGENCY DEPARTMENT AT [facility] Provider Note   CSN: [id number] Arrival date & time: 12/07/23  9050     Patient presents with: Abdominal Pain   Krystal [name] is a 33 y.o. female.    Abdominal Pain   Presents because of abdominal pain.  Patient states he is having lower abdominal pain.  Left flank pain.  Very consistent for previous episodes abdominal pain.  No changes that she can think of in terms of location of her abdominal pain.  Endorses some nausea and vomiting as well.  Still having bowel movements.  No vaginal discharge.  Started menstrual cycle 3 days ago.  No dysuria.  No fever no chills.  Patient states that she has been taking tramadol  at home as well as Zofran  without complete resolution of symptoms.  Patient came to the ED for further evaluation.    Previous medical history reviewed : Patient was last seen in the ED on December 03, 2023.  Was seen because of lower abdominal pain nausea and vomiting.  History of cyclical chronic abdominal pain with nausea and vomiting patient has had 5 negative CT scans in the past year.   Prior to Admission medications   Medication Sig Start Date End Date Taking? Authorizing Provider  acetaminophen  (TYLENOL ) 500 MG tablet Take 1,000 mg by mouth 3 (three) times daily as needed (for pain or headaches).    [provider]  albuterol  (VENTOLIN  HFA) 108 (90 Base) MCG/ACT inhaler Inhale 2 puffs into the lungs every 4 (four) hours as needed for wheezing or shortness of breath. 10/15/22   Luke Orlan HERO, DO  BENADRYL  ALLERGY 25 MG capsule Take 25 mg by mouth every 6 (six) hours as needed for allergies or itching.    [provider]  bupivacaine ,PF, (MARCAINE ) 0.5 % SOLN injection Instill 15 mLs into bladder daily with heparin . 04/06/23     EPINEPHrine  0.3 mg/0.3 mL IJ SOAJ injection Inject 0.3 mg into the muscle as needed for anaphylaxis. 01/15/21   Luke Orlan HERO, DO  FLUoxetine  (PROZAC ) 20 MG capsule  Take 3 capsules (60 mg total) by mouth daily. 07/22/23 11/12/23  Rothfuss, Jacob T, PA-C  guanFACINE (TENEX) 1 MG tablet Take 1 mg by mouth at bedtime.    [provider]  heparin  10000 UNIT/ML injection Instill 4 mLs (40,000 Units total) into the bladder daily with bupivacaine . 04/06/23     hydrALAZINE  (APRESOLINE ) 10 MG tablet Take 1 tablet (10 mg total) by mouth 3 (three) times daily. 10/01/23   Rothfuss, Jacob T, PA-C  hydrocortisone  (CORTEF ) 10 MG tablet Take 10 mg by mouth. 10mg  in the AM & 5 in PM    [provider]  hydrOXYzine  (ATARAX ) 25 MG tablet Take 50 mg by mouth at bedtime.    [provider]  irbesartan -hydrochlorothiazide  (AVALIDE) 150-12.5 MG tablet Take 1 tablet by mouth in the morning and at bedtime. 09/03/23 11/12/23  Rothfuss, Jacob T, PA-C  levothyroxine  (SYNTHROID ) 150 MCG tablet Take 1 tablet (150 mcg total) by mouth daily at 6 (six) AM. 10/06/22 11/12/23  Elnora Ip, MD  linaclotide  (LINZESS ) 145 MCG CAPS capsule Take 1 capsule (145 mcg total) by mouth daily before breakfast. 09/03/23   Rothfuss, Jacob T, PA-C  meloxicam (MOBIC) 7.5 MG tablet Take 7.5-15 mg by mouth daily as needed. 09/20/23   [provider]  NON FORMULARY Take 1 capsule by mouth See admin instructions. Beef organ capsules- Take 1 capsule by mouth once a day  [provider]  OLANZapine  (ZYPREXA ) 2.5 MG tablet Take 2.5 mg by mouth daily.    [provider]  OLANZapine  (ZYPREXA ) 7.5 MG tablet Take 7.5 mg by mouth at bedtime.    [provider]  ondansetron  (ZOFRAN -ODT) 4 MG disintegrating tablet Take 1 tablet (4 mg total) by mouth every 8 (eight) hours as needed for nausea or vomiting. 12/03/23   Franklyn Sid SAILOR, MD  pantoprazole  (PROTONIX ) 40 MG tablet Take 1 tablet (40 mg total) by mouth 2 (two) times daily as needed (for heartburn). 10/01/23   Rothfuss, Jacob T, PA-C  pregabalin  (LYRICA ) 25 MG capsule Take 1 capsule (25 mg total) by mouth 2  (two) times daily. 11/03/23   Rothfuss, Jacob T, PA-C  RASPBERRY PO Take 2 capsules by mouth See admin instructions. Raspberry Leaf capsules- Take 2 capsules by mouth once a day    [provider]  Syringe, Disposable, 20 ML MISC Use as directed for daily bladder instillations 04/06/23     traMADol  (ULTRAM ) 50 MG tablet Take 1 tablet (50 mg total) by mouth every 12 (twelve) hours as needed for moderate pain (pain score 4-6). 04/27/23   Ezenduka, Nkeiruka J, MD    Allergies: Bee venom, Coreg  [carvedilol ], Red dye #40 (allura red), Compazine  [prochlorperazine  maleate], Vicodin [hydrocodone -acetaminophen ], Cymbalta [duloxetine hcl], Ditropan  [oxybutynin ], Elavil [amitriptyline], Motrin  [ibuprofen ], Naprosyn  [naproxen ], Pyrimethamine, Sulfadoxine, Zestril  [lisinopril ], Nifedical xl [nifedipine ], Reglan  [metoclopramide ], and Trandate  [labetalol ]    Review of Systems  Gastrointestinal:  Positive for abdominal pain.    Updated Vital Signs BP (!) 142/86   Pulse (!) 56   Temp 97.7 F (36.5 C) (Oral)   Resp 11   Ht 5' 11 (1.803 m)   Wt 113 kg   LMP 11/11/2023 (Approximate)   SpO2 95%   BMI 34.75 kg/m   Physical Exam Vitals and nursing note reviewed.  Constitutional:      General: She is not in acute distress.    Appearance: She is well-developed.  HENT:     Head: Normocephalic and atraumatic.  Eyes:     Conjunctiva/sclera: Conjunctivae normal.  Cardiovascular:     Rate and Rhythm: Normal rate and regular rhythm.     Heart sounds: No murmur heard. Pulmonary:     Effort: Pulmonary effort is normal. No respiratory distress.     Breath sounds: Normal breath sounds.  Abdominal:     Palpations: Abdomen is soft.     Tenderness: There is no abdominal tenderness.  Musculoskeletal:        General: No swelling.     Cervical back: Neck supple.  Skin:    General: Skin is warm and dry.     Capillary Refill: Capillary refill takes less than 2 seconds.  Neurological:     Mental Status:  She is alert.  Psychiatric:        Mood and Affect: Mood normal.     (all labs ordered are listed, but only abnormal results are displayed) Labs Reviewed  COMPREHENSIVE METABOLIC PANEL WITH GFR - Abnormal; Notable for the following components:      Result Value   BUN 24 (*)    Creatinine, Ser 1.03 (*)    AST 58 (*)    ALT 81 (*)    All other components within normal limits  URINALYSIS, ROUTINE W REFLEX MICROSCOPIC - Abnormal; Notable for the following components:   Hgb urine dipstick SMALL (*)    All other components within normal limits  LIPASE, BLOOD  CBC  HCG, SERUM, QUALITATIVE    EKG: EKG Interpretation Date/Time:  Monday December 07 2023 11:09:58 EDT Ventricular Rate:  64 PR Interval:  142 QRS Duration:  121 QT Interval:  431 QTC Calculation: 445 R Axis:   42  Text Interpretation: Sinus or ectopic atrial rhythm Left bundle branch block Baseline wander in lead(s) V6 Confirmed by Simon Rea [phone]) on 12/07/2023 11:31:42 AM  Radiology: No results found.   Procedures   Medications Ordered in the ED  haloperidol  lactate (HALDOL ) injection 2 mg (2 mg Intravenous Not Given 12/07/23 1157)  pantoprazole  (PROTONIX ) injection 40 mg (has no administration in time range)  lactated ringers  infusion (has no administration in time range)  morphine  (PF) 2 MG/ML injection 2 mg (has no administration in time range)  ondansetron  (ZOFRAN ) injection 4 mg (has no administration in time range)  promethazine  (PHENERGAN ) 12.5 mg in sodium chloride  0.9 % 50 mL IVPB (has no administration in time range)  ketorolac  (TORADOL ) 15 MG/ML injection 15 mg (15 mg Intravenous Given 12/07/23 1044)  ondansetron  (ZOFRAN ) injection 4 mg (4 mg Intravenous Given 12/07/23 1044)  oxyCODONE  (Oxy IR/ROXICODONE ) immediate release tablet 5 mg (5 mg Oral Given 12/07/23 1044)  sodium chloride  0.9 % bolus 1,000 mL (0 mLs Intravenous Stopped 12/07/23 1227)  oxyCODONE  (Oxy IR/ROXICODONE ) immediate release  tablet 5 mg (5 mg Oral Given 12/07/23 1157)  morphine  (PF) 2 MG/ML injection 2 mg (2 mg Intravenous Given 12/07/23 1234)  morphine  (PF) 2 MG/ML injection 2 mg (2 mg Intravenous Given 12/07/23 1536)                                    Medical Decision Making Amount and/or Complexity of Data Reviewed Labs: ordered.  Risk Prescription drug management.      HPI:  Presents because of abdominal pain.  Patient states he is having lower abdominal pain.  Left flank pain.  Very consistent for previous episodes abdominal pain.  No changes that she can think of in terms of location of her abdominal pain.  Endorses some nausea and vomiting as well.  Still having bowel movements.  No vaginal discharge.  Started menstrual cycle 3 days ago.  No dysuria.  No fever no chills.  Patient states that she has been taking tramadol  at home as well as Zofran  without complete resolution of symptoms.  Patient came to the ED for further evaluation.    Previous medical history reviewed : Patient was last seen in the ED on December 03, 2023.  Was seen because of lower abdominal pain nausea and vomiting.  History of cyclical chronic abdominal pain with nausea and vomiting patient has had 5 negative CT scans in the past year.  MDM:    In terms of patient's abdominal pain, patient has a soft and benign abdomen.  No rebound guarding appreciated.  Mild tenderness in suprapubic area.  This is the same area that she is always tender in.  No acute changes per patient in terms of abdominal pain this bring her into the ED.  When reviewing patient's charts, she has had multiple CT scans as well as ultrasounds for the same's complaints.  Will plan on obtaining laboratory workup and addressing patient's pain before proceeding with for any kind of imaging.   Obtain laboratory workup.  Will evaluate for AKI or any kind of significant leukocytosis or other laboratory derangements.  Labs shows no significant AKI.  No leukocytosis.  Anemia.  Small elevation in AST and ALT.  Nonspecific.  Negative pain to palpation of right upper quadrant concerning for any kind of biliary pathology at this point in time.  No indication for imaging.  UA unremarkable.  Small amount of hemoglobin.  Do not think this is indicative of nephrolithiasis.  Rather, patient's open menstrual cycle which is likely causing small amount of urine.  No evidence of UTI.  No concerns for pyelonephritis.  Patient received multiple rounds of both p.o. and IV medications for pain and nausea control.  Unable to tolerate p.o. at time of trying to discharge patient.,  Patient will be admitted for observation given ongoing nausea and vomiting.    EKG Interpreted by Me: nsr   Cardiac Tele Interpreted by Me: nsr      Final diagnoses:  Abdominal pain of multiple sites  Nausea and vomiting, unspecified vomiting type    ED Discharge Orders     None          Simon Lavonia SAILOR, MD 12/07/23 1620

## 2023-12-07 NOTE — H&P (Signed)
 History and Physical  Krystal [name] DOB: 09/21/1990 DOA: 12/07/2023  PCP: Iven Lang DASEN, PA-C   Chief Complaint: Abdominal pain, vomiting  HPI: Krystal [name] is a 33 y.o. 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  Patient states that she has chronic lower abdominal pain every day which fluctuates.  She has been having worsening abdominal pain with associated nausea, vomiting, and inability to tolerate p.o. intake for the last 7 days.  She denies any fevers, she has had some subjective chills and lower back pain which she says she also experiences sometimes when the pain gets really bad.  She tells me that her pain is very typical, there are no new features.  She denies any chest pain, hematemesis, blood in her stools, or any other new concerns.  She has abdominal and lower pelvic pain every day, the best it gets is 5/10, today it is 10/10.  Despite supportive care and at times in the ER today, patient is unable to tolerate anything p.o. and observation is requested.  Note that she was also seen in the ER on 10/9 and successfully discharged home on that day.  Review of Systems: Please see HPI for pertinent positives and negatives. A complete 10 system review of systems are otherwise negative.  Past Medical History:  Diagnosis Date   Acute acalculous cholecystitis s/p lap cholecystectomy 05/11/2020 05/11/2020   Angio-edema 01/15/2021   Anxiety    Asthma    Chronic interstitial cystitis    Depression    Fibromyalgia    HELLP (hemolytic anemia/elev liver enzymes/low platelets in pregnancy) 12/08/2017   Hemorrhagic cyst of left ovary 04/15/2022   History of attempted  suicide    as a teenager   History of borderline personality disorder    History of HELLP syndrome, currently pregnant 04/21/2019   Formatting of this note might be different from the original.  History of HELLP syndrome at 26 weeks 5 days (12/07/2017); admitted to [facility] with BP 200/120s, Plt 52, AST/ALT 471/375  APS evaluation negative (04/2019)   Daily ASA 81 mg   Challenging blood pressure control in current pregnancy      Plan:  - Baseline pre-eclampsia labs this pregnancy within normal limits (Cr: 0.71; urine p:c 0.106)  -    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 03/05/2022   Hypothyroidism due to Hashimoto's thyroiditis    IC (interstitial cystitis)    Insomnia    Kidney stones    Migraines    Miscarriage 09/2020   Necrotizing fasciitis (HCC)    left hand amputation   PTSD (post-traumatic stress disorder)    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   Secondary adrenal insufficiency    Severe uncontrolled hypertension 10/08/2017   UTI (urinary tract infection)    Past Surgical History:  Procedure Laterality Date   APPENDECTOMY     BIOPSY  05/10/2020   Procedure: BIOPSY;  Surgeon: Burnette Fallow, MD;  Location: [facility] ENDOSCOPY;  Service: Endoscopy;;   BIOPSY  05/02/2022   Procedure: BIOPSY;  Surgeon: Leigh Elspeth SQUIBB, MD;  Location: [facility] ENDOSCOPY;  Service: Gastroenterology;;   CESAREAN SECTION N/A 12/06/2017   Procedure: CESAREAN SECTION;  Surgeon: Nicholaus Burnard HERO, MD;  Location: [facility] BIRTHING SUITES;  Service: Obstetrics;  Laterality: N/A;   CESAREAN SECTION  07/21/2021  Procedure: CESAREAN SECTION;  Surgeon: Fredirick Glenys RAMAN, MD;  Location: [facility] LD ORS;  Service: Obstetrics;;   CHOLECYSTECTOMY N/A 05/11/2020   Procedure: SINGLE SITE LAPAROSCOPIC CHOLECYSTECTOMY AND LIVER BIOSY;  Surgeon: Sheldon Standing, MD;  Location: [facility] ORS;  Service: General;  Laterality: N/A;   ESOPHAGOGASTRODUODENOSCOPY (EGD) WITH PROPOFOL  N/A 05/10/2020   Procedure: ESOPHAGOGASTRODUODENOSCOPY (EGD) WITH  PROPOFOL ;  Surgeon: Burnette Fallow, MD;  Location: [facility] ENDOSCOPY;  Service: Endoscopy;  Laterality: N/A;   FLEXIBLE SIGMOIDOSCOPY N/A 05/02/2022   Procedure: FLEXIBLE SIGMOIDOSCOPY;  Surgeon: Leigh Standing SQUIBB, MD;  Location: [facility] ENDOSCOPY;  Service: Gastroenterology;  Laterality: N/A;   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 She has never been exposed to tobacco smoke. She has never used smokeless tobacco. She reports that she does not currently use alcohol. She reports current drug use.  Allergies  Allergen Reactions   Bee Venom Anaphylaxis, Swelling and Other (See Comments)    Swelling of face, throat. Airway restriction.   Coreg  [Carvedilol ] Shortness Of Breath and Other (See Comments)    Triggers asthma   Red Dye #40 (Allura Red) Other (See Comments)    Migraines   Compazine  [Prochlorperazine  Maleate] Other (See Comments)    Twitching Able to tolerate promethazine    Vicodin [Hydrocodone -Acetaminophen ] Itching, Nausea And Vomiting and Other (See Comments)    OK with Tylenol    Cymbalta [Duloxetine Hcl] Other (See Comments)    Irritability  Syncope    Ditropan  [Oxybutynin ] Other (See Comments)    Severe dry mouth   Elavil [Amitriptyline] Other (See Comments)    Irritability    Motrin  [Ibuprofen ] Other (See Comments)    Heartburn    Naprosyn  [Naproxen ] Other (See Comments)    Heartburn    Pyrimethamine Other (See Comments)    From Fansidar combination drug Triggers asthma    Sulfadoxine Other (See Comments)    From Fansidar combination drug Triggers asthma    Zestril  [Lisinopril ] Cough   Nifedical Xl [Nifedipine ] Nausea And Vomiting   Reglan  [Metoclopramide ] Swelling and Other (See Comments)    Restlessness Able to take with Benadryl    Trandate  [Labetalol ] Nausea And Vomiting and Other (See Comments)    Triggers asthma     Family History  Problem Relation Age of Onset   Hypertension Mother    Coronary artery disease Mother    Migraines Mother    Hypertension Father    Stroke Father    Atrial fibrillation Father    Thyroid  disease Sister    Clotting disorder Sister    Asthma Sister    Migraines Sister    Thyroid  disease Sister    Asthma Sister    Migraines Sister    Asthma Brother        oldest   Migraines Brother    Hypertension Brother    Gout Brother    Asthma Brother    Migraines Brother    Asthma Brother    Migraines Brother    Heart failure Maternal Grandfather  Heart attack Maternal Grandfather      Prior to Admission medications   Medication Sig Start Date End Date Taking? Authorizing Provider  acetaminophen  (TYLENOL ) 500 MG tablet Take 1,000 mg by mouth 3 (three) times daily as needed (for pain or headaches).    [provider]  albuterol  (VENTOLIN  HFA) 108 (90 Base) MCG/ACT inhaler Inhale 2 puffs into the lungs every 4 (four) hours as needed for wheezing or shortness of breath. 10/15/22   Luke Orlan HERO, DO  BENADRYL  ALLERGY 25 MG capsule Take 25 mg by mouth every 6 (six) hours as needed for allergies or itching.    [provider]  bupivacaine ,PF, (MARCAINE ) 0.5 % SOLN injection Instill 15 mLs into bladder daily with heparin . 04/06/23     EPINEPHrine  0.3 mg/0.3 mL IJ SOAJ injection Inject 0.3 mg into the muscle as needed for anaphylaxis. 01/15/21   Luke Orlan HERO, DO  FLUoxetine  (PROZAC ) 20 MG capsule Take 3 capsules (60 mg total) by mouth daily. 07/22/23 11/12/23  Rothfuss, Jacob T, PA-C  guanFACINE (TENEX) 1 MG tablet  Take 1 mg by mouth at bedtime.    [provider]  heparin  10000 UNIT/ML injection Instill 4 mLs (40,000 Units total) into the bladder daily with bupivacaine . 04/06/23     hydrALAZINE  (APRESOLINE ) 10 MG tablet Take 1 tablet (10 mg total) by mouth 3 (three) times daily. 10/01/23   Rothfuss, Jacob T, PA-C  hydrocortisone  (CORTEF ) 10 MG tablet Take 10 mg by mouth. 10mg  in the AM & 5 in PM    [provider]  hydrOXYzine  (ATARAX ) 25 MG tablet Take 50 mg by mouth at bedtime.    [provider]  irbesartan -hydrochlorothiazide  (AVALIDE) 150-12.5 MG tablet Take 1 tablet by mouth in the morning and at bedtime. 09/03/23 11/12/23  Rothfuss, Jacob T, PA-C  levothyroxine  (SYNTHROID ) 150 MCG tablet Take 1 tablet (150 mcg total) by mouth daily at 6 (six) AM. 10/06/22 11/12/23  Elnora Ip, MD  linaclotide  (LINZESS ) 145 MCG CAPS capsule Take 1 capsule (145 mcg total) by mouth daily before breakfast. 09/03/23   Rothfuss, Jacob T, PA-C  meloxicam (MOBIC) 7.5 MG tablet Take 7.5-15 mg by mouth daily as needed. 09/20/23   [provider]  NON FORMULARY Take 1 capsule by mouth See admin instructions. Beef organ capsules- Take 1 capsule by mouth once a day    [provider]  OLANZapine  (ZYPREXA ) 2.5 MG tablet Take 2.5 mg by mouth daily.    [provider]  OLANZapine  (ZYPREXA ) 7.5 MG tablet Take 7.5 mg by mouth at bedtime.    [provider]  ondansetron  (ZOFRAN -ODT) 4 MG disintegrating tablet Take 1 tablet (4 mg total) by mouth every 8 (eight) hours as needed for nausea or vomiting. 12/03/23   Franklyn Sid SAILOR, MD  pantoprazole  (PROTONIX ) 40 MG tablet Take 1 tablet (40 mg total) by mouth 2 (two) times daily as needed (for heartburn). 10/01/23   Rothfuss, Jacob T, PA-C  pregabalin  (LYRICA ) 25 MG capsule Take 1 capsule (25 mg total) by mouth 2 (two) times daily. 11/03/23   Rothfuss, Jacob T, PA-C  RASPBERRY PO Take 2 capsules by mouth See admin instructions.  Raspberry Leaf capsules- Take 2 capsules by mouth once a day    [provider]  Syringe, Disposable, 20 ML MISC Use as directed for daily bladder instillations 04/06/23     traMADol  (ULTRAM ) 50 MG tablet Take 1 tablet (50 mg total) by mouth every 12 (twelve) hours as needed for  moderate pain (pain score 4-6). 04/27/23   Donnamarie Lebron PARAS, MD    Physical Exam: BP (!) 142/86   Pulse (!) 56   Temp 97.7 F (36.5 C) (Oral)   Resp 11   Ht 5' 11 (1.803 m)   Wt 113 kg   LMP 11/11/2023 (Approximate)   SpO2 95%   BMI 34.75 kg/m  General:  Alert, oriented, calm, in no acute distress, looks slightly anxious and tired but very pleasant and cooperative Cardiovascular: RRR, no murmurs or rubs, no peripheral edema  Respiratory: clear to auscultation bilaterally, no wheezes, no crackles  Abdomen: soft, nontender, nondistended, normal bowel tones heard  Skin: dry, no rashes, status post left hand amputation, right hand with significant scarring from flap surgery Musculoskeletal: no joint effusions, normal range of motion  Psychiatric: appropriate affect, normal speech  Neurologic: extraocular muscles intact, clear speech, moving all extremities with intact sensorium         Labs on Admission:  Basic Metabolic Panel: Recent Labs  Lab 12/03/23 1116 12/07/23 1042  NA 137 139  K 4.7 3.9  CL 100 102  CO2 26 26  GLUCOSE 100* 94  BUN 18 24*  CREATININE 1.03* 1.03*  CALCIUM  10.4* 9.6   Liver Function Tests: Recent Labs  Lab 12/03/23 1116 12/07/23 1042  AST 25 58*  ALT 32 81*  ALKPHOS 68 55  BILITOT 0.4 0.5  PROT 8.3* 7.4  ALBUMIN 5.1* 4.6   Recent Labs  Lab 12/03/23 1116 12/07/23 1042  LIPASE 21 19   No results for input(s): AMMONIA in the last 168 hours. CBC: Recent Labs  Lab 12/03/23 1116 12/07/23 1042  WBC 7.5 7.0  NEUTROABS 4.6  --   HGB 15.9* 14.4  HCT 48.4* 44.7  MCV 94.3 93.3  PLT 315 295   Cardiac Enzymes: No results for input(s): CKTOTAL,  CKMB, CKMBINDEX, TROPONINI in the last 168 hours. BNP (last 3 results) No results for input(s): BNP in the last 8760 hours.  ProBNP (last 3 results) No results for input(s): PROBNP in the last 8760 hours.  CBG: No results for input(s): GLUCAP in the last 168 hours.  Radiological Exams on Admission: No results found.  Assessment/Plan Krystal [name] is a 33 y.o. 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  Acute on chronic abdominal pain with intractable vomiting-unfortunately this is a chronic recurrence for the patient in the setting of her interstitial cystitis.  She is scheduled to follow-up with pain management clinic in the coming weeks.  No significant electrolyte derangements, vital sign derangements, or other new findings to prompt further workup. -Observation admission -Diet as tolerated -LR infusion -IV morphine  as needed for pain -IV Zofran  as needed for nausea, and IV Phenergan  as needed for breakthrough nausea -IV PPI  Adrenal insufficiency-patient is hemodynamically stable no evidence of adrenal crisis -IV Solu-Cortef  10 mg every 12 hours to replace her p.o. hydrocortisone  -Monitor for signs of adrenal crisis, in which case she will require stress dose steroids  Hypothyroidism-resume Synthroid  at discharge  Hypertension-resume p.o. hydralazine  when able to tolerate, IV hydralazine  as needed in the meantime  DVT prophylaxis: Lovenox      Code Status: Full Code  Consults called: None  Admission status: Observation  Time spent: 48 minutes  Krystal Doane CHRISTELLA Gail  MD [facility] Pager [phone]  If 7PM-7AM, please contact  night-coverage [email or web address] Password [facility]  12/07/2023, 4:49 PM

## 2023-12-08 DIAGNOSIS — Z888 Allergy status to other drugs, medicaments and biological substances status: Secondary | ICD-10-CM | POA: Diagnosis not present

## 2023-12-08 DIAGNOSIS — E66812 Obesity, class 2: Secondary | ICD-10-CM | POA: Diagnosis present

## 2023-12-08 DIAGNOSIS — Z79899 Other long term (current) drug therapy: Secondary | ICD-10-CM | POA: Diagnosis not present

## 2023-12-08 DIAGNOSIS — I1A0 Resistant hypertension: Secondary | ICD-10-CM | POA: Diagnosis present

## 2023-12-08 DIAGNOSIS — Z6834 Body mass index (BMI) 34.0-34.9, adult: Secondary | ICD-10-CM | POA: Diagnosis not present

## 2023-12-08 DIAGNOSIS — Z9049 Acquired absence of other specified parts of digestive tract: Secondary | ICD-10-CM | POA: Diagnosis not present

## 2023-12-08 DIAGNOSIS — Z9102 Food additives allergy status: Secondary | ICD-10-CM | POA: Diagnosis not present

## 2023-12-08 DIAGNOSIS — Z89112 Acquired absence of left hand: Secondary | ICD-10-CM | POA: Diagnosis not present

## 2023-12-08 DIAGNOSIS — R1110 Vomiting, unspecified: Secondary | ICD-10-CM | POA: Diagnosis present

## 2023-12-08 DIAGNOSIS — F32A Depression, unspecified: Secondary | ICD-10-CM | POA: Diagnosis present

## 2023-12-08 DIAGNOSIS — E2740 Unspecified adrenocortical insufficiency: Secondary | ICD-10-CM | POA: Diagnosis present

## 2023-12-08 DIAGNOSIS — Z79890 Hormone replacement therapy (postmenopausal): Secondary | ICD-10-CM | POA: Diagnosis not present

## 2023-12-08 DIAGNOSIS — K581 Irritable bowel syndrome with constipation: Secondary | ICD-10-CM | POA: Diagnosis present

## 2023-12-08 DIAGNOSIS — Z7952 Long term (current) use of systemic steroids: Secondary | ICD-10-CM | POA: Diagnosis not present

## 2023-12-08 DIAGNOSIS — Z9151 Personal history of suicidal behavior: Secondary | ICD-10-CM | POA: Diagnosis not present

## 2023-12-08 DIAGNOSIS — F419 Anxiety disorder, unspecified: Secondary | ICD-10-CM | POA: Diagnosis present

## 2023-12-08 DIAGNOSIS — Z791 Long term (current) use of non-steroidal anti-inflammatories (NSAID): Secondary | ICD-10-CM | POA: Diagnosis not present

## 2023-12-08 DIAGNOSIS — Z91030 Bee allergy status: Secondary | ICD-10-CM | POA: Diagnosis not present

## 2023-12-08 DIAGNOSIS — Z23 Encounter for immunization: Secondary | ICD-10-CM | POA: Diagnosis not present

## 2023-12-08 DIAGNOSIS — Z885 Allergy status to narcotic agent status: Secondary | ICD-10-CM | POA: Diagnosis not present

## 2023-12-08 DIAGNOSIS — N3010 Interstitial cystitis (chronic) without hematuria: Secondary | ICD-10-CM | POA: Diagnosis present

## 2023-12-08 DIAGNOSIS — Z886 Allergy status to analgesic agent status: Secondary | ICD-10-CM | POA: Diagnosis not present

## 2023-12-08 LAB — URINE DRUG SCREEN
Amphetamines: NEGATIVE
Barbiturates: NEGATIVE
Benzodiazepines: NEGATIVE
Cocaine: NEGATIVE
Fentanyl: NEGATIVE
Methadone Scn, Ur: NEGATIVE
Opiates: NEGATIVE
Tetrahydrocannabinol: POSITIVE — AB

## 2023-12-08 LAB — BASIC METABOLIC PANEL WITH GFR
Anion gap: 10 (ref 5–15)
BUN: 20 mg/dL (ref 6–20)
CO2: 23 mmol/L (ref 22–32)
Calcium: 8.7 mg/dL — ABNORMAL LOW (ref 8.9–10.3)
Chloride: 104 mmol/L (ref 98–111)
Creatinine, Ser: 0.9 mg/dL (ref 0.44–1.00)
GFR, Estimated: >60 mL/min (ref >60)
Glucose, Bld: 84 mg/dL (ref 70–99)
Potassium: 4.6 mmol/L (ref 3.5–5.1)
Sodium: 137 mmol/L (ref 135–145)

## 2023-12-08 LAB — CBC
HCT: 42.3 % (ref 36.0–46.0)
Hemoglobin: 13.6 g/dL (ref 12.0–15.0)
MCH: 31.4 pg (ref 26.0–34.0)
MCHC: 32.2 g/dL (ref 30.0–36.0)
MCV: 97.7 fL (ref 80.0–100.0)
Platelets: 193 K/uL (ref 150–400)
RBC: 4.33 MIL/uL (ref 3.87–5.11)
RDW: 13.2 % (ref 11.5–15.5)
WBC: 5.6 K/uL (ref 4.0–10.5)
nRBC: 0 % (ref 0.0–0.2)

## 2023-12-08 MED ORDER — OXYCODONE HCL 5 MG PO TABS
5.0000 mg | ORAL_TABLET | Freq: Four times a day (QID) | ORAL | Status: DC | PRN
Start: 2023-12-08 — End: 2023-12-09
  Administered 2023-12-08 – 2023-12-09 (×3): 5 mg via ORAL
  Filled 2023-12-08 (×3): qty 1

## 2023-12-08 MED ORDER — MORPHINE SULFATE (PF) 2 MG/ML IV SOLN
2.0000 mg | Freq: Four times a day (QID) | INTRAVENOUS | Status: DC | PRN
  Administered 2023-12-08 – 2023-12-09 (×2): 2 mg via INTRAVENOUS
  Filled 2023-12-08 (×2): qty 1

## 2023-12-08 MED ORDER — OXYCODONE HCL 5 MG PO TABS
5.0000 mg | ORAL_TABLET | Freq: Once | ORAL | Status: AC
  Administered 2023-12-08: 5 mg via ORAL
  Filled 2023-12-08: qty 1

## 2023-12-08 MED ORDER — HYDROXYZINE HCL 10 MG PO TABS
10.0000 mg | ORAL_TABLET | Freq: Once | ORAL | Status: AC | PRN
  Administered 2023-12-08: 10 mg via ORAL
  Filled 2023-12-08: qty 1

## 2023-12-08 MED ORDER — FLUOXETINE HCL 20 MG PO CAPS
60.0000 mg | ORAL_CAPSULE | Freq: Every day | ORAL | Status: DC
  Administered 2023-12-08 – 2023-12-09 (×2): 60 mg via ORAL
  Filled 2023-12-08 (×2): qty 3

## 2023-12-08 MED ORDER — OLANZAPINE 5 MG PO TABS
7.5000 mg | ORAL_TABLET | Freq: Once | ORAL | Status: AC
  Administered 2023-12-08: 7.5 mg via ORAL
  Filled 2023-12-08: qty 2

## 2023-12-08 MED ORDER — HYDRALAZINE HCL 20 MG/ML IJ SOLN: 10.0000 mg | Freq: Four times a day (QID) | INTRAMUSCULAR | Status: DC | PRN

## 2023-12-08 NOTE — Progress Notes (Signed)
 PROGRESS NOTE    Krystal [name]  FMW:[id number] DOB: 14-Apr-1990 DOA: 12/07/2023 PCP: Iven Lang DASEN, PA-C   Brief Narrative:  HPI: Krystal [name] is a 33 y.o. 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  Patient states that she has chronic lower abdominal pain every day which fluctuates.  She has been having worsening abdominal pain with associated nausea, vomiting, and inability to tolerate p.o. intake for the last 7 days.  She denies any fevers, she has had some subjective chills and lower back pain which she says she also experiences sometimes when the pain gets really bad.  She tells me that her pain is very typical, there are no new features.  She denies any chest pain, hematemesis, blood in her stools, or any other new concerns.  She has abdominal and lower pelvic pain every day, the best it gets is 5/10, today it is 10/10.  Despite supportive care and at times in the ER today, patient is unable to tolerate anything p.o. and observation is requested.  Note that she was also seen in the ER on 10/9 and successfully discharged home on that day.   Assessment & Plan:   Principal Problem:   Intractable vomiting  Acute on chronic abdominal pain with intractable vomiting-unfortunately this is a chronic recurrence for the patient in the setting of her interstitial cystitis.  She is scheduled to follow-up with pain management clinic in the coming weeks.  No significant electrolyte derangements, vital sign derangements, or other new findings to prompt further workup.  Still with abdominal pain and vomiting this morning.  Continue symptomatic treatment with IV fluids, antinausea medications and IV  pain medications.   Adrenal insufficiency-patient is hemodynamically stable no evidence of adrenal crisis -IV Solu-Cortef  10 mg every 12 hours to replace her p.o. hydrocortisone  -Monitor for signs of adrenal crisis, in which case she will require stress dose steroids   Hypothyroidism-resume Synthroid  when able to take p.o.     Hypertension-blood pressure controlled.  Resume hydralazine  when able to take p.o.  Will put her on as needed IV hydralazine  in the meantime.  History of depression anxiety: Resume home medications.  Class II morbid obesity: BMI of almost 25.  Weight loss intractable condition closely.  DVT prophylaxis: enoxaparin  (LOVENOX ) injection 40 mg Start: 12/07/23 2200   Code Status: Full Code  Family Communication:  None present at bedside.  Plan of care discussed with patient in length and he/she verbalized understanding and agreed with it.  Status is: Observation The patient will require care spanning > 2 midnights and should be moved to inpatient because: Still symptomatic with intractable nausea vomiting and abdominal pain   Estimated body mass index is 34.75 kg/m as calculated from the following:   Height as of this encounter: 5' 11 (1.803 m).   Weight as of this encounter: 113 kg.    Nutritional Assessment: Body mass index is 34.75 kg/m.SABRA Seen by dietician.  I agree with the assessment and plan as outlined below: Nutrition Status:        . Skin Assessment: I have examined the patient's skin and I agree with the wound assessment as performed by the wound care RN as outlined below:    Consultants:  None  Procedures:  None  Antimicrobials:  Anti-infectives (From admission, onward)    None         Subjective: Patient seen and examined earlier, she was still complaining of significant abdominal pain, mostly located at suprapubic area.  Also with nausea and vomiting just a few minutes prior to me seeing her.  No other  complaint.  Objective: Vitals:   12/07/23 1649 12/07/23 1809 12/07/23 2101 12/08/23 0628  BP:  (!) 142/85 122/76 134/87  Pulse:  60 (!) 52 (!) 42  Resp:  13 18 18   Temp: 97.8 F (36.6 C) 98.2 F (36.8 C) 97.8 F (36.6 C) 97.8 F (36.6 C)  TempSrc: Oral Oral    SpO2:  97% 96% 98%  Weight:      Height:        Intake/Output Summary (Last 24 hours) at 12/08/2023 1133 Last data filed at 12/08/2023 0820 Gross per 24 hour  Intake 4047.74 ml  Output 300 ml  Net 3747.74 ml   Filed Weights   12/07/23 0955  Weight: 113 kg    Examination:  General exam: Appears in pain. Respiratory system: Clear to auscultation. Respiratory effort normal. Cardiovascular system: S1 & S2 heard, RRR. No JVD, murmurs, rubs, gallops or clicks. No pedal edema. Gastrointestinal system: Abdomen is nondistended, soft and suprapubic tenderness.  No organomegaly or masses felt. Normal bowel sounds heard. Central nervous system: Alert and oriented. No focal neurological deficits. Extremities: Symmetric 5 x 5 power. Skin: No rashes, lesions or ulcers Psychiatry: Judgement and insight appear normal. Mood & affect appropriate.    Data Reviewed: I have personally reviewed following labs and imaging studies  CBC: Recent Labs  Lab 12/03/23 1116 12/07/23 1042 12/08/23 0440  WBC 7.5 7.0 5.6  NEUTROABS 4.6  --   --   HGB 15.9* 14.4 13.6  HCT 48.4* 44.7 42.3  MCV 94.3 93.3 97.7  PLT 315 295 193   Basic Metabolic Panel: Recent Labs  Lab 12/03/23 1116 12/07/23 1042 12/08/23 0440  NA 137 139 137  K 4.7 3.9 4.6  CL 100 102 104  CO2 26 26 23   GLUCOSE 100* 94 84  BUN 18 24* 20  CREATININE 1.03* 1.03* 0.90  CALCIUM  10.4* 9.6 8.7*   GFR: Estimated Creatinine Clearance: 120.8 mL/min (by C-G formula based on SCr of 0.9 mg/dL). Liver Function Tests: Recent Labs  Lab 12/03/23 1116 12/07/23 1042  AST 25 58*  ALT 32 81*  ALKPHOS 68 55  BILITOT 0.4 0.5  PROT 8.3* 7.4  ALBUMIN 5.1* 4.6   Recent  Labs  Lab 12/03/23 1116 12/07/23 1042  LIPASE 21 19   No results for input(s): AMMONIA in the last 168 hours. Coagulation Profile: No results for input(s): INR, PROTIME in the last 168 hours. Cardiac Enzymes: No results for input(s): CKTOTAL, CKMB, CKMBINDEX, TROPONINI in the last 168 hours. BNP (last 3 results) No results for input(s): PROBNP in the last 8760 hours. HbA1C: No results for input(s): HGBA1C in the last 72 hours. CBG: No results for input(s): GLUCAP in the last 168 hours. Lipid Profile: No results for input(s): CHOL, HDL, LDLCALC, TRIG, CHOLHDL, LDLDIRECT in the last 72 hours. Thyroid  Function Tests: No results for input(s): TSH, T4TOTAL, FREET4, T3FREE, THYROIDAB in the last 72 hours. Anemia Panel: No results for input(s): VITAMINB12, FOLATE, FERRITIN, TIBC, IRON, RETICCTPCT in the last 72 hours. Sepsis Labs: No results for input(s): PROCALCITON, LATICACIDVEN in the last 168 hours.  No results found for this or any previous visit (from the past 240 hours).  Radiology Studies: No results found.  Scheduled Meds:  enoxaparin  (LOVENOX ) injection  40 mg Subcutaneous Q24H   feeding supplement  237 mL Oral BID BM   haloperidol  lactate  2 mg Intravenous Once   hydrocortisone  sod succinate (SOLU-CORTEF ) inj  10 mg Intravenous Q12H   influenza vac split trivalent PF  0.5 mL Intramuscular Tomorrow-1000   oxyCODONE   5 mg Oral Once   pantoprazole  (PROTONIX ) IV  40 mg Intravenous Q24H   Continuous Infusions:  lactated ringers  100 mL/hr at 12/08/23 0413   promethazine  (PHENERGAN ) injection (IM or IVPB) 12.5 mg (12/07/23 1659)     LOS: 0 days   Fredia Skeeter, MD [facility]  12/08/2023, 11:33 AM   *Please note that this is a verbal dictation therefore any spelling or grammatical errors are due to the Dragon Medical One system interpretation.  Please page via Amion and do not message via secure chat  for urgent patient care matters. Secure chat can be used for non urgent patient care matters.  How to contact the [facility] Attending or Consulting provider 7A - 7P or covering provider during after hours 7P -7A, for this patient?  Check the care team in [facility] and look for a) attending/consulting [facility] provider listed and b) the [facility] team listed. Page or secure chat 7A-7P. Log into [email or web address] and use [place]'s universal password to access. If you do not have the password, please contact the hospital operator. Locate the [facility] provider you are looking for under [facility] and page to a number that you can be directly reached. If you still have difficulty reaching the provider, please page the [facility] (Director on Call) for the Hospitalists listed on amion for assistance.

## 2023-12-08 NOTE — Plan of Care (Signed)
 ?  Problem: Clinical Measurements: ?Goal: Ability to maintain clinical measurements within normal limits will improve ?Outcome: Progressing ?Goal: Will remain free from infection ?Outcome: Progressing ?Goal: Diagnostic test results will improve ?Outcome: Progressing ?  ?

## 2023-12-08 NOTE — Progress Notes (Signed)
   12/08/23 0835  TOC Brief Assessment  Insurance and Status Reviewed  Patient has primary care physician Yes  Home environment has been reviewed single family home  Prior level of function: independent  Prior/Current Home Services No current home services  Social Drivers of Health Review SDOH reviewed no interventions necessary  Readmission risk has been reviewed Yes  Transition of care needs no transition of care needs at this time    Signed: Heather Saltness, MSW, LCSW Clinical Social Worker Inpatient Care Management 12/08/2023 8:35 AM

## 2023-12-08 NOTE — Plan of Care (Signed)

## 2023-12-09 DIAGNOSIS — R1110 Vomiting, unspecified: Secondary | ICD-10-CM | POA: Diagnosis not present

## 2023-12-09 LAB — BASIC METABOLIC PANEL WITH GFR
Anion gap: 9 (ref 5–15)
BUN: 14 mg/dL (ref 6–20)
CO2: 26 mmol/L (ref 22–32)
Calcium: 9 mg/dL (ref 8.9–10.3)
Chloride: 106 mmol/L (ref 98–111)
Creatinine, Ser: 0.99 mg/dL (ref 0.44–1.00)
GFR, Estimated: >60 mL/min (ref >60)
Glucose, Bld: 82 mg/dL (ref 70–99)
Potassium: 4.6 mmol/L (ref 3.5–5.1)
Sodium: 140 mmol/L (ref 135–145)

## 2023-12-09 NOTE — Plan of Care (Signed)

## 2023-12-09 NOTE — Discharge Summary (Signed)
 Physician Discharge Summary  Krystal [name] FMW:[id number] DOB: 1990-09-27 DOA: 12/07/2023  PCP: Iven Lang DASEN, PA-C  Admit date: 12/07/2023 Discharge date: 12/09/2023 30 Day Unplanned Readmission Risk Score    Flowsheet Row ED to Hosp-Admission (Current) from 12/07/2023 in [facility]  30 Day Unplanned Readmission Risk Score (%) 31.7 Filed at 12/09/2023 0801    This score is the patient's risk of an unplanned readmission within 30 days of being discharged (0 -100%). The score is based on dignosis, age, lab data, medications, orders, and past utilization.   Low:  0-14.9   Medium: 15-21.9   High: 22-29.9   Extreme: 30 and above          Admitted From: Home Disposition: Home  Recommendations for Outpatient Follow-up:  Follow up with PCP in 1-2 weeks Please obtain BMP/CBC in one week Please follow up with your PCP on the following pending results: Unresulted Labs (From admission, onward)    None         Home Health: None Equipment/Devices: None none  Discharge Condition: Stable CODE STATUS: Full code Diet recommendation:  Diet Order             DIET DYS 3 Room service appropriate? Yes; Fluid consistency: Thin  Diet effective now                 Due to brief hospitalization, I have copied admitting hospitalist HPI and ED course as below.  HPI: Krystal [name] is a 33 y.o. 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  Patient states that she has chronic lower abdominal pain every day which fluctuates.  She has been having worsening abdominal pain with associated nausea, vomiting, and inability to tolerate p.o. intake for the  last 7 days.  She denies any fevers, she has had some subjective chills and lower back pain which she says she also experiences sometimes when the pain gets really bad.  She tells me that her pain is very typical, there are no new features.  She denies any chest pain, hematemesis, blood in her stools, or any other new concerns.  She has abdominal and lower pelvic pain every day, the best it gets is 5/10, today it is 10/10.  Despite supportive care and at times in the ER today, patient is unable to tolerate anything p.o. and observation is requested.  Note that she was also seen in the ER on 10/9 and successfully discharged home on that day.   Subjective: Seen and examined, she says that although her pain is better but has not completely resolved.  She still has intermittent nausea but she did tolerate the regular diet this morning without having any vomiting since yesterday.  She tells me that she has finally got appointment with the pain clinic at 1 PM today and would like to be discharged home so she does not miss that appointment.  Brief/Interim Summary: Details of hospitalization as below.  Acute on chronic abdominal pain with intractable vomiting-unfortunately this is a chronic recurrence for the patient in the setting of her interstitial cystitis.  She is scheduled to follow-up with pain management clinic in the coming weeks.  No significant electrolyte derangements, vital sign derangements, or other new findings to prompt further workup.  Abdominal pain is improving.  She has appointment with the pain clinic today at 1 PM and she prefers to be discharged.   Adrenal insufficiency-patient is hemodynamically stable no evidence of adrenal crisis -IV Solu-Cortef  10 mg every 12 hours to replace her p.o. hydrocortisone  - Zoom p.o. Hydrocort  resume at home.   Hypothyroidism-resume Synthroid     Hypertension-blood pressure controlled.  Resume home medications.   History of depression anxiety: Resume  home medications.   Class II morbid obesity: BMI of almost 25.  Weight loss intractable condition closely.  Discharge plan was discussed with patient and/or family member and they verbalized understanding and agreed with it.  Discharge Diagnoses:  Principal Problem:   Intractable vomiting Active Problems:   Intractable nausea and vomiting    Discharge Instructions   Allergies as of 12/09/2023       Reactions   Bee Venom Anaphylaxis, Swelling, Other (See Comments)   Swelling of face, throat. Airway restriction.   Coreg  [carvedilol ] Shortness Of Breath, Other (See Comments)   Triggers asthma   Red Dye #40 (allura Red) Other (See Comments)   Migraines   Compazine  [prochlorperazine  Maleate] Other (See Comments)   Twitching Able to tolerate promethazine    Vicodin [hydrocodone -acetaminophen ] Itching, Nausea And Vomiting, Other (See Comments)   OK with Tylenol    Cymbalta [duloxetine Hcl] Other (See Comments)   Irritability  Syncope    Ditropan  [oxybutynin ] Other (See Comments)   Severe dry mouth   Elavil [amitriptyline] Other (See Comments)   Irritability    Motrin  [ibuprofen ] Other (See Comments)   Heartburn    Naprosyn  [naproxen ] Other (See Comments)   Heartburn   Pyrimethamine Other (See Comments)   From Fansidar combination drug Triggers asthma   Sulfadoxine Other (See Comments)   From Fansidar combination drug Triggers asthma    Zestril  [lisinopril ] Cough   Nifedical Xl [nifedipine ] Nausea And Vomiting   Reglan  [metoclopramide ] Swelling, Other (See Comments)   Restlessness Able to take with Benadryl    Trandate  [labetalol ] Nausea And Vomiting, Other (See Comments)   Triggers asthma         Medication List     TAKE these medications    acetaminophen  500 MG tablet Commonly known as: TYLENOL  Take 1,000 mg by mouth 3 (three) times daily as needed (for pain or headaches).   albuterol  108 (90 Base) MCG/ACT inhaler Commonly known as: Ventolin  HFA Inhale 2  puffs into the lungs every 4 (four) hours as needed for wheezing or shortness of breath.   bupivacaine (PF) 0.5 % Soln injection Commonly known as: MARCAINE  Instill 15 mLs into bladder daily with heparin . What changed: when to take this   EPINEPHrine  0.3 mg/0.3 mL Soaj injection Commonly known as: EPI-PEN Inject 0.3 mg into the muscle as needed for anaphylaxis.   FLUoxetine  20 MG capsule Commonly known as: PROZAC  Take 3 capsules (60 mg total) by mouth daily.   guanFACINE 2 MG Tb24 ER tablet Commonly known as: INTUNIV Take 2 mg by mouth at bedtime.   heparin  10000 UNIT/ML injection Instill 4 mLs (40,000 Units total) into the bladder daily with bupivacaine . What changed: when to take this   hydrALAZINE  10 MG tablet Commonly known as: APRESOLINE  Take 1 tablet (10 mg total) by mouth 3 (three) times daily.   hydrocortisone  10 MG tablet Commonly known as: CORTEF  Take 5-10 mg by mouth See admin instructions. Take 10 mg by mouth in the morning and take 5 mg by mouth at bedtime.   hydrOXYzine  25 MG tablet Commonly known  as: ATARAX  Take 50 mg by mouth at bedtime.   irbesartan -hydrochlorothiazide  150-12.5 MG tablet Commonly known as: AVALIDE Take 1 tablet by mouth in the morning and at bedtime.   levothyroxine  150 MCG tablet Commonly known as: SYNTHROID  Take 1 tablet (150 mcg total) by mouth daily at 6 (six) AM.   linaclotide  145 MCG Caps capsule Commonly known as: LINZESS  Take 1 capsule (145 mcg total) by mouth daily before breakfast.   meloxicam 7.5 MG tablet Commonly known as: MOBIC Take 7.5-15 mg by mouth daily as needed.   MONOJECT 20CC SYRINGE LUER LOK 20 ML Misc Generic drug: Syringe (Disposable) Use as directed for daily bladder instillations   NON FORMULARY Take 1 capsule by mouth See admin instructions. Beef organ capsules- Take 1 capsule by mouth once a day   OLANZapine  7.5 MG tablet Commonly known as: ZYPREXA  Take 7.5 mg by mouth at bedtime.   OLANZapine   2.5 MG tablet Commonly known as: ZYPREXA  Take 2.5 mg by mouth daily.   ondansetron  4 MG disintegrating tablet Commonly known as: ZOFRAN -ODT Take 1 tablet (4 mg total) by mouth every 8 (eight) hours as needed for nausea or vomiting.   pantoprazole  40 MG tablet Commonly known as: PROTONIX  Take 1 tablet (40 mg total) by mouth 2 (two) times daily as needed (for heartburn). What changed: when to take this   pregabalin  25 MG capsule Commonly known as: Lyrica  Take 1 capsule (25 mg total) by mouth 2 (two) times daily. What changed: when to take this   RASPBERRY PO Take 2 capsules by mouth See admin instructions. Raspberry Leaf capsules- Take 2 capsules by mouth once a day   traMADol  50 MG tablet Commonly known as: ULTRAM  Take 1 tablet (50 mg total) by mouth every 12 (twelve) hours as needed for moderate pain (pain score 4-6). What changed:  when to take this reasons to take this   Uribel 118 MG Caps Take 1 capsule by mouth daily as needed (bladder pain).        Follow-up Information     Rothfuss, Lang DASEN, PA-C Follow up in 1 week(s).   Specialty: Physician Assistant Contact information: [address] [place] [address] [phone]                Allergies  Allergen Reactions   Bee Venom Anaphylaxis, Swelling and Other (See Comments)    Swelling of face, throat. Airway restriction.   Coreg  [Carvedilol ] Shortness Of Breath and Other (See Comments)    Triggers asthma   Red Dye #40 (Allura Red) Other (See Comments)    Migraines   Compazine  [Prochlorperazine  Maleate] Other (See Comments)    Twitching Able to tolerate promethazine    Vicodin [Hydrocodone -Acetaminophen ] Itching, Nausea And Vomiting and Other (See Comments)    OK with Tylenol    Cymbalta [Duloxetine Hcl] Other (See Comments)    Irritability  Syncope    Ditropan  [Oxybutynin ] Other (See Comments)    Severe dry mouth   Elavil [Amitriptyline] Other (See Comments)    Irritability    Motrin  [Ibuprofen ]  Other (See Comments)    Heartburn    Naprosyn  [Naproxen ] Other (See Comments)    Heartburn    Pyrimethamine Other (See Comments)    From Fansidar combination drug Triggers asthma   Sulfadoxine Other (See Comments)    From Fansidar combination drug Triggers asthma    Zestril  [Lisinopril ] Cough   Nifedical Xl [Nifedipine ] Nausea And Vomiting   Reglan  [Metoclopramide ] Swelling and Other (See Comments)    Restlessness  Able to take with Benadryl    Trandate  [Labetalol ] Nausea And Vomiting and Other (See Comments)    Triggers asthma     Consultations: None   Procedures/Studies: No results found.   Discharge Exam: Vitals:   12/08/23 2025 12/09/23 0522  BP: 136/89 (!) 157/94  Pulse: (!) 50 (!) 48  Resp: 14 16  Temp: 98.5 F (36.9 C) 97.6 F (36.4 C)  SpO2: 99% 99%   Vitals:   12/08/23 0628 12/08/23 1248 12/08/23 2025 12/09/23 0522  BP: 134/87 133/88 136/89 (!) 157/94  Pulse: (!) 42 (!) 48 (!) 50 (!) 48  Resp: 18 17 14 16   Temp: 97.8 F (36.6 C) 98 F (36.7 C) 98.5 F (36.9 C) 97.6 F (36.4 C)  TempSrc:   Oral Oral  SpO2: 98% 100% 99% 99%  Weight:      Height:        General: Pt is alert, awake, not in acute distress Cardiovascular: RRR, S1/S2 +, no rubs, no gallops Respiratory: CTA bilaterally, no wheezing, no rhonchi Abdominal: Soft, suprapubic tenderness, ND, bowel sounds + Extremities: no edema, no cyanosis    The results of significant diagnostics from this hospitalization (including imaging, microbiology, ancillary and laboratory) are listed below for reference.     Microbiology: No results found for this or any previous visit (from the past 240 hours).   Labs: BNP (last 3 results) No results for input(s): BNP in the last 8760 hours. Basic Metabolic Panel: Recent Labs  Lab 12/03/23 1116 12/07/23 1042 12/08/23 0440 12/09/23 0435  NA 137 139 137 140  K 4.7 3.9 4.6 4.6  CL 100 102 104 106  CO2 26 26 23 26   GLUCOSE 100* 94 84 82  BUN 18 24*  20 14  CREATININE 1.03* 1.03* 0.90 0.99  CALCIUM  10.4* 9.6 8.7* 9.0   Liver Function Tests: Recent Labs  Lab 12/03/23 1116 12/07/23 1042  AST 25 58*  ALT 32 81*  ALKPHOS 68 55  BILITOT 0.4 0.5  PROT 8.3* 7.4  ALBUMIN 5.1* 4.6   Recent Labs  Lab 12/03/23 1116 12/07/23 1042  LIPASE 21 19   No results for input(s): AMMONIA in the last 168 hours. CBC: Recent Labs  Lab 12/03/23 1116 12/07/23 1042 12/08/23 0440  WBC 7.5 7.0 5.6  NEUTROABS 4.6  --   --   HGB 15.9* 14.4 13.6  HCT 48.4* 44.7 42.3  MCV 94.3 93.3 97.7  PLT 315 295 193   Cardiac Enzymes: No results for input(s): CKTOTAL, CKMB, CKMBINDEX, TROPONINI in the last 168 hours. BNP: Invalid input(s): POCBNP CBG: No results for input(s): GLUCAP in the last 168 hours. D-Dimer No results for input(s): DDIMER in the last 72 hours. Hgb A1c No results for input(s): HGBA1C in the last 72 hours. Lipid Profile No results for input(s): CHOL, HDL, LDLCALC, TRIG, CHOLHDL, LDLDIRECT in the last 72 hours. Thyroid  function studies No results for input(s): TSH, T4TOTAL, T3FREE, THYROIDAB in the last 72 hours.  Invalid input(s): FREET3 Anemia work up No results for input(s): VITAMINB12, FOLATE, FERRITIN, TIBC, IRON, RETICCTPCT in the last 72 hours. Urinalysis    Component Value Date/Time   COLORURINE YELLOW 12/07/2023 1227   APPEARANCEUR CLEAR 12/07/2023 1227   APPEARANCEUR Clear 03/25/2023 1519   LABSPEC 1.025 12/07/2023 1227   PHURINE 6.0 12/07/2023 1227   GLUCOSEU NEGATIVE 12/07/2023 1227   HGBUR SMALL (A) 12/07/2023 1227   BILIRUBINUR NEGATIVE 12/07/2023 1227   BILIRUBINUR negative (A) 11/03/2023 1105   BILIRUBINUR Negative 03/25/2023 1519  KETONESUR NEGATIVE 12/07/2023 1227   PROTEINUR NEGATIVE 12/07/2023 1227   UROBILINOGEN 0.2 11/03/2023 1105   NITRITE NEGATIVE 12/07/2023 1227   LEUKOCYTESUR NEGATIVE 12/07/2023 1227   Sepsis Labs Recent Labs  Lab  12/03/23 1116 12/07/23 1042 12/08/23 0440  WBC 7.5 7.0 5.6   Microbiology No results found for this or any previous visit (from the past 240 hours).  FURTHER DISCHARGE INSTRUCTIONS:   Get Medicines reviewed and adjusted: Please take all your medications with you for your next visit with your Primary MD   Laboratory/radiological data: Please request your Primary MD to go over all hospital tests and procedure/radiological results at the follow up, please ask your Primary MD to get all Hospital records sent to his/her office.   In some cases, they will be blood work, cultures and biopsy results pending at the time of your discharge. Please request that your primary care M.D. goes through all the records of your hospital data and follows up on these results.   Also Note the following: If you experience worsening of your admission symptoms, develop shortness of breath, life threatening emergency, suicidal or homicidal thoughts you must seek medical attention immediately by calling 911 or calling your MD immediately  if symptoms less severe.   You must read complete instructions/literature along with all the possible adverse reactions/side effects for all the Medicines you take and that have been prescribed to you. Take any new Medicines after you have completely understood and accpet all the possible adverse reactions/side effects.    patient was instructed, not to drive, operate heavy machinery, perform activities at heights, swimming or participation in water activities or provide baby-sitting services while on Pain, Sleep and Anxiety Medications; until their outpatient Physician has advised to do so again. Also recommended to not to take more than prescribed Pain, Sleep and Anxiety Medications.  It is not advisable to combine anxiety, sleep and pain medications without talking with your primary care provider.     Wear Seat belts while driving.   Please note: You were cared for by a  hospitalist during your hospital stay. Once you are discharged, your primary care physician will handle any further medical issues. Please note that NO REFILLS for any discharge medications will be authorized once you are discharged, as it is imperative that you return to your primary care physician (or establish a relationship with a primary care physician if you do not have one) for your post hospital discharge needs so that they can reassess your need for medications and monitor your lab values  Time coordinating discharge: Over 30 minutes  SIGNED:   Fredia Skeeter, MD  [facility] 12/09/2023, 10:39 AM *Please note that this is a verbal dictation therefore any spelling or grammatical errors are due to the Dragon Medical One system interpretation. If 7PM-7AM, please contact night-coverage [email or web address]

## 2023-12-10 ENCOUNTER — Telehealth: Payer: Self-pay

## 2023-12-10 NOTE — Transitions of Care (Post Inpatient/ED Visit) (Signed)
   12/10/2023  Name: Krystal [name] MRN: [id number] DOB: 02/14/1991  Today's TOC FU Call Status: Today's TOC FU Call Status:: Successful TOC FU Call Completed TOC FU Call Complete Date: 12/10/23 Patient's Name and Date of Birth confirmed.  Transition Care Management Follow-up Telephone Call Date of Discharge: 12/09/23 Discharge Facility: [facility]) Type of Discharge: Inpatient Admission Primary Inpatient Discharge Diagnosis:: nausea vomiting How have you been since you were released from the hospital?: Better Any questions or concerns?: No  Items Reviewed: Did you receive and understand the discharge instructions provided?: Yes Medications obtained,verified, and reconciled?: Yes (Medications Reviewed) Any new allergies since your discharge?: No Dietary orders reviewed?: Yes Do you have support at home?: Yes People in Home [RPT]: spouse  Medications Reviewed Today: Medications Reviewed Today   Medications were not reviewed in this encounter     Home Care and Equipment/Supplies: Were Home Health Services Ordered?: NA Any new equipment or medical supplies ordered?: NA  Functional Questionnaire: Do you need assistance with bathing/showering or dressing?: No Do you need assistance with meal preparation?: No Do you need assistance with eating?: No Do you have difficulty maintaining continence: No Do you need assistance with getting out of bed/getting out of a chair/moving?: No Do you have difficulty managing or taking your medications?: No  Follow up appointments reviewed: PCP Follow-up appointment confirmed?: Yes Date of PCP follow-up appointment?: 12/16/23 Follow-up Provider: [facility] Follow-up appointment confirmed?: NA Do you need transportation to your follow-up appointment?: No Do you understand care options if your condition(s) worsen?: Yes-patient verbalized understanding    SIGNATURE Julian Lemmings, LPN [facility] Nurse Health  Advisor Direct Dial [phone]

## 2023-12-16 ENCOUNTER — Inpatient Hospital Stay (HOSPITAL_BASED_OUTPATIENT_CLINIC_OR_DEPARTMENT_OTHER): Payer: MEDICAID | Admitting: Student

## 2023-12-16 ENCOUNTER — Ambulatory Visit (INDEPENDENT_AMBULATORY_CARE_PROVIDER_SITE_OTHER): Payer: MEDICAID | Admitting: Student

## 2023-12-16 ENCOUNTER — Encounter (HOSPITAL_BASED_OUTPATIENT_CLINIC_OR_DEPARTMENT_OTHER): Payer: Self-pay | Admitting: Student

## 2023-12-16 VITALS — BP 134/89 | HR 79 | Temp 98.0°F | Resp 16 | Ht 71.0 in | Wt 245.8 lb

## 2023-12-16 DIAGNOSIS — Z09 Encounter for follow-up examination after completed treatment for conditions other than malignant neoplasm: Secondary | ICD-10-CM

## 2023-12-16 DIAGNOSIS — N3010 Interstitial cystitis (chronic) without hematuria: Secondary | ICD-10-CM | POA: Diagnosis not present

## 2023-12-16 DIAGNOSIS — K219 Gastro-esophageal reflux disease without esophagitis: Secondary | ICD-10-CM | POA: Diagnosis not present

## 2023-12-16 DIAGNOSIS — I1A0 Resistant hypertension: Secondary | ICD-10-CM

## 2023-12-16 DIAGNOSIS — E2740 Unspecified adrenocortical insufficiency: Secondary | ICD-10-CM

## 2023-12-16 LAB — CBC WITH DIFFERENTIAL/PLATELET
Basophils Absolute: 0 x10E3/uL (ref 0.0–0.2)
Basos: 1 %
EOS (ABSOLUTE): 0 x10E3/uL (ref 0.0–0.4)
Eos: 0 %
Hematocrit: 48.3 % — ABNORMAL HIGH (ref 34.0–46.6)
Hemoglobin: 16.2 g/dL — ABNORMAL HIGH (ref 11.1–15.9)
Immature Grans (Abs): 0 x10E3/uL (ref 0.0–0.1)
Immature Granulocytes: 0 %
Lymphocytes Absolute: 1.8 x10E3/uL (ref 0.7–3.1)
Lymphs: 28 %
MCH: 31.6 pg (ref 26.6–33.0)
MCHC: 33.5 g/dL (ref 31.5–35.7)
MCV: 94 fL (ref 79–97)
Monocytes Absolute: 0.7 x10E3/uL (ref 0.1–0.9)
Monocytes: 10 %
Neutrophils Absolute: 4 x10E3/uL (ref 1.4–7.0)
Neutrophils: 61 %
Platelets: 345 x10E3/uL (ref 150–450)
RBC: 5.12 x10E6/uL (ref 3.77–5.28)
RDW: 13.1 % (ref 11.7–15.4)
WBC: 6.6 x10E3/uL (ref 3.4–10.8)

## 2023-12-16 LAB — BASIC METABOLIC PANEL WITH GFR
BUN/Creatinine Ratio: 22 (ref 9–23)
BUN: 23 mg/dL — ABNORMAL HIGH (ref 6–20)
CO2: 23 mmol/L (ref 20–29)
Calcium: 10.4 mg/dL — ABNORMAL HIGH (ref 8.7–10.2)
Chloride: 98 mmol/L (ref 96–106)
Creatinine, Ser: 1.03 mg/dL — ABNORMAL HIGH (ref 0.57–1.00)
Glucose: 102 mg/dL — ABNORMAL HIGH (ref 70–99)
Potassium: 4.6 mmol/L (ref 3.5–5.2)
Sodium: 136 mmol/L (ref 134–144)
eGFR: 73 mL/min/1.73 (ref >59)

## 2023-12-16 NOTE — Patient Instructions (Signed)
 It was nice to see you today!  If you have any problems before your next visit feel free to message me via MyChart (minor issues or questions) or call the office, otherwise you may reach out to schedule an office visit.  Thank you! Pau Banh, PA-C

## 2023-12-16 NOTE — Progress Notes (Unsigned)
 Established Patient Office Visit  Subjective   Patient ID: Krystal [name], female    DOB: 12-Oct-1990  Age: 33 y.o. MRN: [id number]  Chief Complaint  Patient presents with   Hospitalization Follow-up    Hospital follow up.    HPI  Discussed the use of AI scribe software for clinical note transcription with the patient, who gave verbal consent to proceed.  History of Present Illness   Krystal [name] is a 32 year old female with chronic interstitial cystitis who presents with ongoing management issues and medication refill concerns.  She has persistent symptoms of chronic interstitial cystitis and has been under the care of a urogynocologist. Her current provider is on medical leave, and the PA has been unable to provide adequate management, leading her to consider switching practices. She has been receiving bladder instillations, including heparin , but has run out of supplies. Despite contacting the office twice, the pharmacy has not received the necessary prescriptions. She last contacted them about a week after her last visit, which was at the end of last month.  She is on tramadol  for pain management and has had multiple hospital visits due to her condition. She recently completed a course of doxycycline  for a possible infection, including ureaplasma and mycoplasma.  She reports blood pressure readings as low as 103/74 at home. She was hospitalized due to severe vomiting and pain, which led to her being unable to keep anything down.  She is currently taking Protonix  40 mg twice daily for GERD but continues to experience symptoms such as reflux and a sensation of food sitting in her throat. She has an upcoming appointment with a gastroenterologist for further evaluation.       Patient Active Problem List   Diagnosis Date Noted   Intractable vomiting 12/07/2023   Daytime somnolence 10/01/2023   Chronic idiopathic constipation 09/04/2023   Bee allergy status 07/09/2023    BMI 36.0-36.9,adult 07/09/2023   Pelvic pain, chronic 07/04/2023   Anxiety and depression 07/04/2023   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/30/2023   Intractable nausea and vomiting 03/13/2023   Intractable vomiting with nausea 03/13/2023   Encounter for screening involving social determinants of health (SDoH) 10/07/2022   Chronic interstitial cystitis 05/05/2022   Chronic diarrhea 04/29/2022   Lower abdominal pain 04/28/2022   Adrenal insufficiency 04/26/2022   Bradycardia 04/24/2022   Moderate persistent asthma with acute exacerbation 12/04/2021   Cervicalgia 10/31/2021   Borderline personality disorder in adult [facility]) 10/21/2021   Anisocoria 07/02/2021   History of excision of intestinal structure 05/23/2021   Attention deficit disorder 04/17/2021   Chronic fatigue syndrome 04/17/2021   Anosmia 04/17/2021   Chronic urticaria 01/15/2021   Local reaction to hymenoptera sting 01/15/2021   Perennial allergic rhinitis 01/15/2021   MDD (major depressive disorder), recurrent episode, severe (HCC) 11/19/2020   Fibromyalgia 05/11/2020   Gastroesophageal reflux disease 05/11/2020   Interstitial cystitis 05/11/2020   PTSD (post-traumatic stress disorder) 05/11/2020   Primary insomnia 05/11/2020   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   Hypothyroidism due to Hashimoto's thyroiditis 10/03/2019   Anxiety 06/16/2019   Asthma 06/07/2019   Obesity 06/07/2019   Lumbar spondylosis 06/23/2018   Sacroiliac inflammation 06/23/2018   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   History of upper limb amputation, wrist, 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   Past Medical History:  Diagnosis Date   Acute acalculous cholecystitis s/p lap cholecystectomy 05/11/2020 05/11/2020   Angio-edema 01/15/2021   Anxiety    Asthma    Chronic interstitial cystitis  Depression    Fibromyalgia    HELLP (hemolytic anemia/elev liver enzymes/low platelets in pregnancy) 12/08/2017   Hemorrhagic cyst of left ovary 04/15/2022   History of  attempted suicide    as a teenager   History of borderline personality disorder    History of HELLP syndrome, currently pregnant 04/21/2019   Formatting of this note might be different from the original.  History of HELLP syndrome at 26 weeks 5 days (12/07/2017); admitted to [facility] with BP 200/120s, Plt 52, AST/ALT 471/375  APS evaluation negative (04/2019)   Daily ASA 81 mg   Challenging blood pressure control in current pregnancy      Plan:  - Baseline pre-eclampsia labs this pregnancy within normal limits (Cr: 0.71; urine p:c 0.106)  -    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 03/05/2022   Hypothyroidism due to Hashimoto's thyroiditis    IC (interstitial cystitis)    Insomnia    Kidney stones    Migraines    Miscarriage 09/2020   Necrotizing fasciitis (HCC)    left hand amputation   PTSD (post-traumatic stress disorder)    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   Secondary adrenal insufficiency    Severe uncontrolled hypertension 10/08/2017   UTI (urinary tract infection)    Social History   Tobacco Use   Smoking status: Never    Passive exposure: Never   Smokeless tobacco: Never   Tobacco comments:    She tried a cigarette and never smoked again  Vaping Use   Vaping status: Some Days   Devices: DELTA- 8  Substance Use Topics   Alcohol use: Not Currently    Comment: NOT CURRENTLY DRINKING 11/12/2023   Drug use: Yes    Comment: DELTA-8 occasionally   Allergies  Allergen Reactions   Bee Venom Anaphylaxis, Swelling and Other (See Comments)    Swelling of face, throat. Airway restriction.   Coreg  [Carvedilol ] Shortness Of Breath and Other (See Comments)    Triggers asthma   Red Dye #40 (Allura Red) Other (See Comments)    Migraines   Compazine  [Prochlorperazine  Maleate] Other (See Comments)    Twitching Able to tolerate promethazine    Vicodin [Hydrocodone -Acetaminophen ] Itching, Nausea And Vomiting and Other (See Comments)    OK with Tylenol    Cymbalta  [Duloxetine Hcl] Other (See Comments)    Irritability  Syncope    Ditropan  [Oxybutynin ] Other (See Comments)    Severe dry mouth   Elavil [Amitriptyline] Other (See Comments)    Irritability    Motrin  [Ibuprofen ] Other (See Comments)    Heartburn    Naprosyn  [Naproxen ] Other (See Comments)    Heartburn    Pyrimethamine Other (See Comments)    From Fansidar combination drug Triggers asthma   Sulfadoxine Other (See Comments)    From Fansidar combination drug Triggers asthma    Zestril  [Lisinopril ] Cough   Nifedical Xl [Nifedipine ] Nausea And Vomiting   Reglan  [Metoclopramide ] Swelling and Other (See Comments)    Restlessness Able to take with Benadryl    Trandate  [Labetalol ] Nausea And Vomiting and Other (See Comments)    Triggers asthma       ROS Per HPI.    Objective:     BP 134/89 (Cuff Size: Normal)   Pulse 79   Temp 98 F (36.7 C) (Oral)   Resp 16   Ht 5' 11 (1.803 m)   Wt 245 lb 12.8 oz (111.5 kg)   LMP 12/09/2023 (Approximate)   SpO2 98%   BMI 34.28 kg/m  BP Readings from  Last 3 Encounters:  12/16/23 134/89  12/09/23 (!) 157/94  12/03/23 (!) 141/84   Wt Readings from Last 3 Encounters:  12/16/23 245 lb 12.8 oz (111.5 kg)  12/07/23 249 lb 1.9 oz (113 kg)  12/03/23 250 lb (113.4 kg)      Physical Exam Constitutional:      General: She is not in acute distress.    Appearance: Normal appearance. She is not ill-appearing.  HENT:     Head: Normocephalic and atraumatic.     Nose: Nose normal.  Eyes:     General: No scleral icterus.    Conjunctiva/sclera: Conjunctivae normal.  Cardiovascular:     Rate and Rhythm: Normal rate and regular rhythm.     Heart sounds: Normal heart sounds. No murmur heard.    No friction rub.  Pulmonary:     Effort: Pulmonary effort is normal. No respiratory distress.     Breath sounds: Normal breath sounds. No wheezing, rhonchi or rales.  Musculoskeletal:        General: Normal range of motion.  Skin:    General:  Skin is warm and dry.     Coloration: Skin is not jaundiced or pale.  Neurological:     General: No focal deficit present.     Mental Status: She is alert.  Psychiatric:        Mood and Affect: Mood normal.        Behavior: Behavior normal.      Results for orders placed or performed in visit on 12/16/23  CBC with Differential/Platelet  Result Value Ref Range   WBC 6.6 3.4 - 10.8 x10E3/uL   RBC 5.12 3.77 - 5.28 x10E6/uL   Hemoglobin 16.2 (H) 11.1 - 15.9 g/dL   Hematocrit 51.6 (H) 65.9 - 46.6 %   MCV 94 79 - 97 fL   MCH 31.6 26.6 - 33.0 pg   MCHC 33.5 31.5 - 35.7 g/dL   RDW 86.8 88.2 - 84.5 %   Platelets 345 150 - 450 x10E3/uL   Neutrophils 61 Not Estab. %   Lymphs 28 Not Estab. %   Monocytes 10 Not Estab. %   Eos 0 Not Estab. %   Basos 1 Not Estab. %   Neutrophils Absolute 4.0 1.4 - 7.0 x10E3/uL   Lymphocytes Absolute 1.8 0.7 - 3.1 x10E3/uL   Monocytes Absolute 0.7 0.1 - 0.9 x10E3/uL   EOS (ABSOLUTE) 0.0 0.0 - 0.4 x10E3/uL   Basophils Absolute 0.0 0.0 - 0.2 x10E3/uL   Immature Granulocytes 0 Not Estab. %   Immature Grans (Abs) 0.0 0.0 - 0.1 x10E3/uL  Basic metabolic panel with GFR  Result Value Ref Range   Glucose 102 (H) 70 - 99 mg/dL   BUN 23 (H) 6 - 20 mg/dL   Creatinine, Ser 8.96 (H) 0.57 - 1.00 mg/dL   eGFR 73 >40 fO/fpw/8.26   BUN/Creatinine Ratio 22 9 - 23   Sodium 136 134 - 144 mmol/L   Potassium 4.6 3.5 - 5.2 mmol/L   Chloride 98 96 - 106 mmol/L   CO2 23 20 - 29 mmol/L   Calcium  10.4 (H) 8.7 - 10.2 mg/dL    Last CBC Lab Results  Component Value Date   WBC 6.6 12/16/2023   HGB 16.2 (H) 12/16/2023   HCT 48.3 (H) 12/16/2023   MCV 94 12/16/2023   MCH 31.6 12/16/2023   RDW 13.1 12/16/2023   PLT 345 12/16/2023   Last metabolic panel Lab Results  Component Value Date   GLUCOSE  102 (H) 12/16/2023   NA 136 12/16/2023   K 4.6 12/16/2023   CL 98 12/16/2023   CO2 23 12/16/2023   BUN 23 (H) 12/16/2023   CREATININE 1.03 (H) 12/16/2023   GFRNONAA >60  12/09/2023   CALCIUM  10.4 (H) 12/16/2023   PHOS 3.9 04/22/2023   PROT 7.4 12/07/2023   ALBUMIN 4.6 12/07/2023   LABGLOB 2.7 11/03/2023   AGRATIO 1.9 08/14/2021   BILITOT 0.5 12/07/2023   ALKPHOS 55 12/07/2023   AST 58 (H) 12/07/2023   ALT 81 (H) 12/07/2023   ANIONGAP 9 12/09/2023   Last lipids Lab Results  Component Value Date   CHOL 185 07/09/2023   HDL 38 (L) 07/09/2023   LDLCALC 107 (H) 07/09/2023   TRIG 233 (H) 07/09/2023   CHOLHDL 4.9 (H) 07/09/2023   Last hemoglobin A1c Lab Results  Component Value Date   HGBA1C 5.2 07/09/2023      The ASCVD Risk score (Arnett DK, et al., 2019) failed to calculate for the following reasons:   The 2019 ASCVD risk score is only valid for ages 48 to 31    Assessment & Plan:   Assessment and Plan    Hospital Discharge/Interstitial cystitis with chronic pain Chronic interstitial cystitis with ongoing pain. Patient would like to transfer from urology to urogyn- will send. Has run out of supplies for bladder instillations and has not received a refill despite multiple calls. - Send referral to Dr. Rosaline Caper at [facility] for [facility] for urogynecology care - Call current urology practice to request bladder instillation supplies - Order cbc and bmp post hospital discharge  Reviewed ER note from 12/07/23  Adrenal insufficiency Chronic, overall stable. Adrenal insufficiency with fluctuating blood pressure. Recent hospitalization due to inability to keep food down and pain, which was not attributed to adrenal glands. Blood pressure has been lower at home, but was normal during hospitalization. No major edema noted today. - Order CBC and BMP post-hospital discharge - Monitor blood pressure at home a couple of times a week - Continue to monitor.  Hypertension Chronic, stable. Hypertension with recent lower readings at home. Blood pressure was 134/89 during the visit, which is acceptable given her history. No immediate changes to  management are planned unless home readings become consistently elevated. - Monitor blood pressure at home - Report if blood pressure becomes chronically elevated - Continue current regimen.  Gastroesophageal reflux disease (GERD) GERD with persistent symptoms despite taking Protonix  40 mg twice daily. Symptoms include sensation of reflux in the throat and waking up with reflux.  She has an upcoming appointment with GI for further evaluation. - Attend GI appointment on the 17th for further evaluation - Consider Neeson fundoplication if GI evaluation indicates significant esophageal issue     Return in about 6 months (around 06/15/2024) for Chronic Followup.    Darcel Frane T Menashe Kafer, PA-C

## 2023-12-18 ENCOUNTER — Ambulatory Visit (HOSPITAL_BASED_OUTPATIENT_CLINIC_OR_DEPARTMENT_OTHER): Payer: Self-pay | Admitting: Student

## 2024-01-01 ENCOUNTER — Ambulatory Visit: Payer: Self-pay

## 2024-01-01 ENCOUNTER — Encounter (HOSPITAL_BASED_OUTPATIENT_CLINIC_OR_DEPARTMENT_OTHER): Payer: Self-pay

## 2024-01-01 ENCOUNTER — Emergency Department (HOSPITAL_COMMUNITY)
Admission: EM | Admit: 2024-01-01 | Discharge: 2024-01-01 | Disposition: A | Discharge status: Home / Self Care | Payer: MEDICAID | Attending: Emergency Medicine | Admitting: Emergency Medicine

## 2024-01-01 ENCOUNTER — Ambulatory Visit (HOSPITAL_BASED_OUTPATIENT_CLINIC_OR_DEPARTMENT_OTHER)
Admission: EM | Admit: 2024-01-01 | Discharge: 2024-01-01 | Disposition: A | Discharge status: Home / Self Care | Payer: MEDICAID | Attending: Family Medicine | Admitting: Family Medicine

## 2024-01-01 ENCOUNTER — Encounter (HOSPITAL_COMMUNITY): Payer: Self-pay

## 2024-01-01 ENCOUNTER — Other Ambulatory Visit: Payer: Self-pay

## 2024-01-01 DIAGNOSIS — Z79899 Other long term (current) drug therapy: Secondary | ICD-10-CM | POA: Diagnosis not present

## 2024-01-01 DIAGNOSIS — J069 Acute upper respiratory infection, unspecified: Secondary | ICD-10-CM | POA: Diagnosis not present

## 2024-01-01 DIAGNOSIS — R112 Nausea with vomiting, unspecified: Secondary | ICD-10-CM | POA: Insufficient documentation

## 2024-01-01 DIAGNOSIS — R109 Unspecified abdominal pain: Secondary | ICD-10-CM | POA: Diagnosis present

## 2024-01-01 DIAGNOSIS — R1084 Generalized abdominal pain: Secondary | ICD-10-CM | POA: Insufficient documentation

## 2024-01-01 DIAGNOSIS — I10 Essential (primary) hypertension: Secondary | ICD-10-CM | POA: Insufficient documentation

## 2024-01-01 LAB — COMPREHENSIVE METABOLIC PANEL WITH GFR
ALT: 20 U/L (ref 0–44)
AST: 28 U/L (ref 15–41)
Albumin: 4.4 g/dL (ref 3.5–5.0)
Alkaline Phosphatase: 69 U/L (ref 38–126)
Anion gap: 13 (ref 5–15)
BUN: 15 mg/dL (ref 6–20)
CO2: 24 mmol/L (ref 22–32)
Calcium: 9.7 mg/dL (ref 8.9–10.3)
Chloride: 100 mmol/L (ref 98–111)
Creatinine, Ser: 1.04 mg/dL — ABNORMAL HIGH (ref 0.44–1.00)
GFR, Estimated: >60 mL/min (ref >60)
Glucose, Bld: 114 mg/dL — ABNORMAL HIGH (ref 70–99)
Potassium: 3.7 mmol/L (ref 3.5–5.1)
Sodium: 137 mmol/L (ref 135–145)
Total Bilirubin: 0.6 mg/dL (ref 0.0–1.2)
Total Protein: 7.4 g/dL (ref 6.5–8.1)

## 2024-01-01 LAB — POC COVID19/FLU A&B COMBO
Covid Antigen, POC: NEGATIVE
Influenza A Antigen, POC: NEGATIVE
Influenza B Antigen, POC: NEGATIVE

## 2024-01-01 LAB — CBC
HCT: 42.5 % (ref 36.0–46.0)
Hemoglobin: 14.2 g/dL (ref 12.0–15.0)
MCH: 31.1 pg (ref 26.0–34.0)
MCHC: 33.4 g/dL (ref 30.0–36.0)
MCV: 93.2 fL (ref 80.0–100.0)
Platelets: 327 K/uL (ref 150–400)
RBC: 4.56 MIL/uL (ref 3.87–5.11)
RDW: 13.5 % (ref 11.5–15.5)
WBC: 9.1 K/uL (ref 4.0–10.5)
nRBC: 0 % (ref 0.0–0.2)

## 2024-01-01 LAB — URINALYSIS, ROUTINE W REFLEX MICROSCOPIC
Bilirubin Urine: NEGATIVE
Glucose, UA: NEGATIVE mg/dL
Ketones, ur: NEGATIVE mg/dL
Leukocytes,Ua: NEGATIVE
Nitrite: NEGATIVE
Protein, ur: NEGATIVE mg/dL
Specific Gravity, Urine: 1.014 (ref 1.005–1.030)
pH: 7 (ref 5.0–8.0)

## 2024-01-01 LAB — HCG, SERUM, QUALITATIVE: Preg, Serum: NEGATIVE

## 2024-01-01 LAB — LIPASE, BLOOD: Lipase: 24 U/L (ref 11–51)

## 2024-01-01 MED ORDER — HYDROMORPHONE HCL 1 MG/ML IJ SOLN
1.0000 mg | Freq: Once | INTRAMUSCULAR | Status: AC
  Administered 2024-01-01: 1 mg via INTRAVENOUS
  Filled 2024-01-01: qty 1

## 2024-01-01 MED ORDER — LACTATED RINGERS IV BOLUS
1000.0000 mL | Freq: Once | INTRAVENOUS | Status: AC
  Administered 2024-01-01: 1000 mL via INTRAVENOUS

## 2024-01-01 MED ORDER — HYDROCORTISONE SOD SUC (PF) 100 MG IJ SOLR
100.0000 mg | Freq: Once | INTRAMUSCULAR | Status: AC
  Administered 2024-01-01: 100 mg via INTRAVENOUS
  Filled 2024-01-01: qty 2

## 2024-01-01 MED ORDER — ONDANSETRON HCL 4 MG/2ML IJ SOLN
4.0000 mg | Freq: Once | INTRAMUSCULAR | Status: AC
  Administered 2024-01-01: 4 mg via INTRAVENOUS
  Filled 2024-01-01: qty 2

## 2024-01-01 NOTE — Discharge Instructions (Signed)
 Go to the ER as instructed by your endocrinologist.  COVID and flu test negative

## 2024-01-01 NOTE — ED Provider Notes (Signed)
 [facility]    CSN: [id number] Arrival date & time: 01/01/24  1554      History   Chief Complaint Chief Complaint  Patient presents with   Sore Throat   sinus congestion   Joint Pain    HPI Krystal [name] is a 33 y.o. female.   Patient is a 33 year old female who presents today with upper respiratory symptoms.  Woke up yesterday with severe head congestion, sore throat, moist cough. States asthma became a problem this morning. Needs covid/flu testing. +smoker.  Was sent here by her endocrinologist to rule out COVID and flu and if negative was wanted to go to the ER for blood work and steroids.    Sore Throat    Past Medical History:  Diagnosis Date   Acute acalculous cholecystitis s/p lap cholecystectomy 05/11/2020 05/11/2020   Angio-edema 01/15/2021   Anxiety    Asthma    Chronic interstitial cystitis    Depression    Fibromyalgia    HELLP (hemolytic anemia/elev liver enzymes/low platelets in pregnancy) 12/08/2017   Hemorrhagic cyst of left ovary 04/15/2022   History of attempted suicide    as a teenager   History of borderline personality disorder    History of HELLP syndrome, currently pregnant 04/21/2019   Formatting of this note might be different from the original.  History of HELLP syndrome at 26 weeks 5 days (12/07/2017); admitted to [facility] with BP 200/120s, Plt 52, AST/ALT 471/375  APS evaluation negative (04/2019)   Daily ASA 81 mg   Challenging blood pressure control in current pregnancy      Plan:  - Baseline pre-eclampsia labs this pregnancy within normal limits (Cr: 0.71; urine p:c 0.106)  -    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 03/05/2022   Hypothyroidism due to Hashimoto's thyroiditis    IC (interstitial cystitis)    Insomnia    Kidney stones    Migraines    Miscarriage 09/2020   Necrotizing fasciitis (HCC)    left hand amputation   PTSD (post-traumatic stress disorder)    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    Secondary adrenal insufficiency    Severe uncontrolled hypertension 10/08/2017   UTI (urinary tract infection)     Patient Active Problem List   Diagnosis Date Noted   Intractable vomiting 12/07/2023   Daytime somnolence 10/01/2023   Chronic idiopathic constipation 09/04/2023   Bee allergy status 07/09/2023   BMI 36.0-36.9,adult 07/09/2023   Pelvic pain, chronic 07/04/2023   Anxiety and depression 07/04/2023   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/30/2023   Intractable nausea and vomiting 03/13/2023   Intractable vomiting with nausea 03/13/2023   Encounter for screening involving social determinants of health (SDoH) 10/07/2022   Chronic interstitial cystitis 05/05/2022   Chronic diarrhea 04/29/2022   Lower abdominal pain 04/28/2022   Adrenal insufficiency 04/26/2022   Bradycardia 04/24/2022   Moderate persistent asthma with acute exacerbation 12/04/2021   Cervicalgia 10/31/2021   Borderline personality disorder in adult [facility]) 10/21/2021   Anisocoria 07/02/2021   History of excision of intestinal structure 05/23/2021   Attention deficit disorder 04/17/2021   Chronic fatigue syndrome 04/17/2021   Anosmia 04/17/2021   Chronic urticaria 01/15/2021   Local reaction to hymenoptera sting 01/15/2021   Perennial allergic rhinitis 01/15/2021   MDD (major depressive disorder), recurrent episode, severe (HCC) 11/19/2020   Fibromyalgia 05/11/2020   Gastroesophageal reflux disease 05/11/2020   Interstitial cystitis 05/11/2020   PTSD (post-traumatic stress disorder) 05/11/2020   Primary insomnia 05/11/2020   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  Hypothyroidism due to Hashimoto's thyroiditis 10/03/2019   Anxiety 06/16/2019   Asthma 06/07/2019   Obesity 06/07/2019   Lumbar spondylosis 06/23/2018   Sacroiliac inflammation 06/23/2018   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   History of upper limb amputation, wrist, 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    Past Surgical History:  Procedure Laterality Date    APPENDECTOMY     BIOPSY  05/10/2020   Procedure: BIOPSY;  Surgeon: Burnette Fallow, MD;  Location: [facility] ENDOSCOPY;  Service: Endoscopy;;   BIOPSY  05/02/2022   Procedure: BIOPSY;  Surgeon: Leigh Elspeth SQUIBB, MD;  Location: [facility] ENDOSCOPY;  Service: Gastroenterology;;   CESAREAN SECTION N/A 12/06/2017   Procedure: CESAREAN SECTION;  Surgeon: Nicholaus Burnard HERO, MD;  Location: [facility] BIRTHING SUITES;  Service: Obstetrics;  Laterality: N/A;   CESAREAN SECTION  07/21/2021   Procedure: CESAREAN SECTION;  Surgeon: Fredirick Glenys RAMAN, MD;  Location: [facility] LD ORS;  Service: Obstetrics;;   CHOLECYSTECTOMY N/A 05/11/2020   Procedure: SINGLE SITE LAPAROSCOPIC CHOLECYSTECTOMY AND LIVER BIOSY;  Surgeon: Sheldon Elspeth, MD;  Location: [facility] ORS;  Service: General;  Laterality: N/A;   ESOPHAGOGASTRODUODENOSCOPY (EGD) WITH PROPOFOL  N/A 05/10/2020   Procedure: ESOPHAGOGASTRODUODENOSCOPY (EGD) WITH PROPOFOL ;  Surgeon: Burnette Fallow, MD;  Location: [facility] ENDOSCOPY;  Service: Endoscopy;  Laterality: N/A;   FLEXIBLE SIGMOIDOSCOPY N/A 05/02/2022   Procedure: FLEXIBLE SIGMOIDOSCOPY;  Surgeon: Leigh Elspeth SQUIBB, MD;  Location: [facility] ENDOSCOPY;  Service: Gastroenterology;  Laterality: N/A;   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   HAND RECONSTRUCTION Right    INCISION AND DRAINAGE     LAPAROSCOPIC LYSIS OF ADHESIONS  04/22/2022   Procedure: LAPAROSCOPIC LYSIS OF ADHESIONS;  Surgeon: Izell Harari, MD;  Location: [facility] OR;  Service: Gynecology;;   LAPAROSCOPIC OVARIAN CYSTECTOMY Left 04/22/2022   Procedure: LAPAROSCOPIC LEFT OVARIAN CYSTECTOMY;  Surgeon: Izell Harari, MD;  Location: [facility] OR;  Service: Gynecology;  Laterality: Left;   NASAL SEPTUM SURGERY      OB History     Gravida  5   Para  3   Term  0   Preterm  3   AB  2   Living  2      SAB  2   IAB  0   Ectopic  0   Multiple  0   Live Births  2            Home Medications    Prior to Admission medications   Medication Sig Start Date End  Date Taking? Authorizing Provider  acetaminophen  (TYLENOL ) 500 MG tablet Take 1,000 mg by mouth 3 (three) times daily as needed (for pain or headaches).    [provider]  albuterol  (VENTOLIN  HFA) 108 (90 Base) MCG/ACT inhaler Inhale 2 puffs into the lungs every 4 (four) hours as needed for wheezing or shortness of breath. 10/15/22   Luke Orlan HERO, DO  bupivacaine ,PF, (MARCAINE ) 0.5 % SOLN injection Instill 15 mLs into bladder daily with heparin . Patient not taking: Reported on 12/16/2023 04/06/23     EPINEPHrine  0.3 mg/0.3 mL IJ SOAJ injection Inject 0.3 mg into the muscle as needed for anaphylaxis. 01/15/21   Luke Orlan HERO, DO  FLUoxetine  (PROZAC ) 20 MG capsule Take 3 capsules (60 mg total) by mouth daily. 07/22/23 12/16/23  Rothfuss, Jacob T, PA-C  guanFACINE (INTUNIV) 2 MG TB24 ER tablet Take 2 mg by mouth at bedtime.    [provider]  heparin  10000 UNIT/ML injection Instill 4 mLs (40,000 Units total) into the bladder daily  with bupivacaine . Patient not taking: Reported on 12/16/2023 04/06/23     hydrALAZINE  (APRESOLINE ) 10 MG tablet Take 1 tablet (10 mg total) by mouth 3 (three) times daily. 10/01/23   Rothfuss, Jacob T, PA-C  hydrocortisone  (CORTEF ) 10 MG tablet Take 5-10 mg by mouth See admin instructions. Take 10 mg by mouth in the morning and take 5 mg by mouth at bedtime.    [provider]  hydrOXYzine  (ATARAX ) 25 MG tablet Take 50 mg by mouth at bedtime.    [provider]  irbesartan -hydrochlorothiazide  (AVALIDE) 150-12.5 MG tablet Take 1 tablet by mouth in the morning and at bedtime. 09/03/23 12/16/23  Rothfuss, Jacob T, PA-C  levothyroxine  (SYNTHROID ) 150 MCG tablet Take 1 tablet (150 mcg total) by mouth daily at 6 (six) AM. 10/06/22 12/16/23  Elnora Ip, MD  linaclotide  (LINZESS ) 145 MCG CAPS capsule Take 1 capsule (145 mcg total) by mouth daily before breakfast. 09/03/23   Rothfuss, Jacob T, PA-C  NON FORMULARY Take 1 capsule by mouth See admin  instructions. Beef organ capsules- Take 1 capsule by mouth once a day    [provider]  OLANZapine  (ZYPREXA ) 2.5 MG tablet Take 2.5 mg by mouth daily.    [provider]  OLANZapine  (ZYPREXA ) 7.5 MG tablet Take 7.5 mg by mouth at bedtime.    [provider]  ondansetron  (ZOFRAN -ODT) 4 MG disintegrating tablet Take 1 tablet (4 mg total) by mouth every 8 (eight) hours as needed for nausea or vomiting. 12/03/23   Franklyn Sid SAILOR, MD  pantoprazole  (PROTONIX ) 40 MG tablet Take 1 tablet (40 mg total) by mouth 2 (two) times daily as needed (for heartburn). Patient taking differently: Take 40 mg by mouth 2 (two) times daily. 10/01/23   Rothfuss, Jacob T, PA-C  pregabalin  (LYRICA ) 25 MG capsule Take 1 capsule (25 mg total) by mouth 2 (two) times daily. Patient taking differently: Take 25 mg by mouth daily. 11/03/23   Rothfuss, Jacob T, PA-C  RASPBERRY PO Take 2 capsules by mouth See admin instructions. Raspberry Leaf capsules- Take 2 capsules by mouth once a day    [provider]  Syringe, Disposable, 20 ML MISC Use as directed for daily bladder instillations 04/06/23     traMADol  (ULTRAM ) 50 MG tablet Take 1 tablet (50 mg total) by mouth every 12 (twelve) hours as needed for moderate pain (pain score 4-6). Patient taking differently: Take 50 mg by mouth every 6 (six) hours as needed for moderate pain (pain score 4-6) or severe pain (pain score 7-10). 04/27/23   Donnamarie Lebron PARAS, MD    Family History Family History  Problem Relation Age of Onset   Hypertension Mother    Coronary artery disease Mother    Migraines Mother    Hypertension Father    Stroke Father    Atrial fibrillation Father    Thyroid  disease Sister    Clotting disorder Sister    Asthma Sister    Migraines Sister    Thyroid  disease Sister    Asthma Sister    Migraines Sister    Asthma Brother        oldest   Migraines Brother    Hypertension Brother    Gout Brother    Asthma Brother     Migraines Brother    Asthma Brother    Migraines Brother    Heart failure Maternal Grandfather    Heart attack Maternal Grandfather     Social History Social History   Tobacco Use  Smoking status: Never    Passive exposure: Never   Smokeless tobacco: Never   Tobacco comments:    She tried a cigarette and never smoked again  Vaping Use   Vaping status: Some Days   Devices: DELTA- 8  Substance Use Topics   Alcohol use: Not Currently    Comment: NOT CURRENTLY DRINKING 11/12/2023   Drug use: Yes    Comment: DELTA-8 occasionally     Allergies   Bee venom, Coreg  [carvedilol ], Red dye #40 (allura red), Compazine  [prochlorperazine  maleate], Vicodin [hydrocodone -acetaminophen ], Cymbalta [duloxetine hcl], Ditropan  [oxybutynin ], Elavil [amitriptyline], Motrin  [ibuprofen ], Naprosyn  [naproxen ], Pyrimethamine, Sulfadoxine, Zestril  [lisinopril ], Nifedical xl [nifedipine ], Reglan  [metoclopramide ], and Trandate  [labetalol ]   Review of Systems Review of Systems   Physical Exam Triage Vital Signs ED Triage Vitals  Encounter Vitals Group     BP 01/01/24 1621 137/85     Girls Systolic BP Percentile --      Girls Diastolic BP Percentile --      Boys Systolic BP Percentile --      Boys Diastolic BP Percentile --      Pulse Rate 01/01/24 1621 87     Resp 01/01/24 1621 20     Temp 01/01/24 1621 98.5 F (36.9 C)     Temp Source 01/01/24 1621 Oral     SpO2 01/01/24 1621 92 %     Weight --      Height --      Head Circumference --      Peak Flow --      Pain Score 01/01/24 1623 6     Pain Loc --      Pain Education --      Exclude from Growth Chart --    No data found.  Updated Vital Signs BP 137/85 (BP Location: Right Arm)   Pulse 87   Temp 98.5 F (36.9 C) (Oral)   Resp 20   LMP 12/09/2023 (Approximate)   SpO2 92%   Visual Acuity Right Eye Distance:   Left Eye Distance:   Bilateral Distance:    Right Eye Near:   Left Eye Near:    Bilateral Near:     Physical  Exam Vitals and nursing note reviewed.  Constitutional:      General: She is not in acute distress.    Appearance: Normal appearance. She is not ill-appearing, toxic-appearing or diaphoretic.  Pulmonary:     Effort: Pulmonary effort is normal.  Neurological:     Mental Status: She is alert.  Psychiatric:        Mood and Affect: Mood normal.      UC Treatments / Results  Labs (all labs ordered are listed, but only abnormal results are displayed) Labs Reviewed  POC COVID19/FLU A&B COMBO - Normal    EKG   Radiology No results found.  Procedures Procedures (including critical care time)  Medications Ordered in UC Medications - No data to display  Initial Impression / Assessment and Plan / UC Course  I have reviewed the triage vital signs and the nursing notes.  Pertinent labs & imaging results that were available during my care of the patient were reviewed by me and considered in my medical decision making (see chart for details).     URI-COVID and flu test negative today.  Recommended go ahead and go to the ER as recommended by her endocrinologist for blood work and steroids. Final Clinical Impressions(s) / UC Diagnoses   Final diagnoses:  Upper respiratory tract infection, unspecified  type     Discharge Instructions      Go to the ER as instructed by your endocrinologist.  COVID and flu test negative     ED Prescriptions   None    I have reviewed the PDMP during this encounter.   Adah Wilbert LABOR, FNP 01/01/24 [phone]

## 2024-01-01 NOTE — ED Provider Notes (Signed)
 [place] EMERGENCY DEPARTMENT AT [facility] Provider Note   CSN: [id number] Arrival date & time: 01/01/24  2139     Patient presents with: Abdominal Pain   Krystal [name] is a 33 y.o. female.    Abdominal Pain    Patient has history of adrenal insufficiency interstitial cystitis, uncontrolled hypertension, prior necrotizing fasciitis, PTSD, help syndrome, fibromyalgia.  Patient has recurrent episodes of abdominal pain.  Patient states she started having an episode that she feels is similar to a flare of interstitial cystitis today.  She has had multiple episodes of vomiting and has had some loose stools.  She feels like her blood pressure was running low.  She called her endocrinologist and was instructed to Tested for COVID and flu.  She was also told to increase her steroid dose.  Patient takes not to keep anything down.  She go to an urgent care and was negative for COVID and nothing  Prior to Admission medications   Medication Sig Start Date End Date Taking? Authorizing Provider  acetaminophen  (TYLENOL ) 500 MG tablet Take 1,000 mg by mouth 3 (three) times daily as needed (for pain or headaches).   Yes [provider]  albuterol  (VENTOLIN  HFA) 108 (90 Base) MCG/ACT inhaler Inhale 2 puffs into the lungs every 4 (four) hours as needed for wheezing or shortness of breath. 10/15/22  Yes Luke Orlan HERO, DO  EPINEPHrine  0.3 mg/0.3 mL IJ SOAJ injection Inject 0.3 mg into the muscle as needed for anaphylaxis. 01/15/21  Yes Luke Orlan HERO, DO  FLUoxetine  (PROZAC ) 20 MG capsule Take 3 capsules (60 mg total) by mouth daily. 07/22/23 01/01/24 Yes Rothfuss, Jacob T, PA-C  guanFACINE (INTUNIV) 2 MG TB24 ER tablet Take 2 mg by mouth at bedtime.   Yes [provider]  hydrALAZINE  (APRESOLINE ) 10 MG tablet Take 1 tablet (10 mg total) by mouth 3 (three) times daily. 10/01/23  Yes Rothfuss, Jacob T, PA-C  hydrocortisone  (CORTEF ) 10 MG tablet Take 5-10 mg by mouth See admin  instructions. Take 10 mg by mouth in the morning and take 5 mg by mouth at bedtime.   Yes [provider]  hydrOXYzine  (ATARAX ) 25 MG tablet Take 50 mg by mouth at bedtime.   Yes [provider]  irbesartan -hydrochlorothiazide  (AVALIDE) 150-12.5 MG tablet Take 1 tablet by mouth in the morning and at bedtime. 09/03/23 01/01/24 Yes Rothfuss, Jacob T, PA-C  levothyroxine  (SYNTHROID ) 150 MCG tablet Take 1 tablet (150 mcg total) by mouth daily at 6 (six) AM. 10/06/22 01/01/24 Yes Elnora Ip, MD  linaclotide  (LINZESS ) 145 MCG CAPS capsule Take 1 capsule (145 mcg total) by mouth daily before breakfast. 09/03/23  Yes Rothfuss, Jacob T, PA-C  OLANZapine  (ZYPREXA ) 2.5 MG tablet Take 2.5 mg by mouth daily.   Yes [provider]  OLANZapine  (ZYPREXA ) 7.5 MG tablet Take 7.5 mg by mouth at bedtime.   Yes [provider]  ondansetron  (ZOFRAN -ODT) 4 MG disintegrating tablet Take 1 tablet (4 mg total) by mouth every 8 (eight) hours as needed for nausea or vomiting. 12/03/23  Yes Franklyn Sid SAILOR, MD  pantoprazole  (PROTONIX ) 40 MG tablet Take 1 tablet (40 mg total) by mouth 2 (two) times daily as needed (for heartburn). Patient taking differently: Take 40 mg by mouth 2 (two) times daily. 10/01/23  Yes Rothfuss, Jacob T, PA-C  pregabalin  (LYRICA ) 25 MG capsule Take 1 capsule (25 mg total) by mouth 2 (two) times daily. Patient taking differently: Take 25 mg by mouth daily.  11/03/23  Yes Rothfuss, Jacob T, PA-C  RASPBERRY PO Take 2 capsules by mouth See admin instructions. Raspberry Leaf capsules- Take 2 capsules by mouth once a day   Yes [provider]  traMADol  (ULTRAM ) 50 MG tablet Take 1 tablet (50 mg total) by mouth every 12 (twelve) hours as needed for moderate pain (pain score 4-6). Patient taking differently: Take 50 mg by mouth every 6 (six) hours as needed for moderate pain (pain score 4-6) or severe pain (pain score 7-10). 04/27/23  Yes Ezenduka, Nkeiruka J, MD   bupivacaine ,PF, (MARCAINE ) 0.5 % SOLN injection Instill 15 mLs into bladder daily with heparin . Patient not taking: Reported on 12/16/2023 04/06/23     heparin  10000 UNIT/ML injection Instill 4 mLs (40,000 Units total) into the bladder daily with bupivacaine . Patient not taking: Reported on 12/16/2023 04/06/23     NON FORMULARY Take 1 capsule by mouth See admin instructions. Beef organ capsules- Take 1 capsule by mouth once a day    [provider]  Syringe, Disposable, 20 ML MISC Use as directed for daily bladder instillations 04/06/23       Allergies: Bee venom, Coreg  [carvedilol ], Red dye #40 (allura red), Compazine  [prochlorperazine  maleate], Vicodin [hydrocodone -acetaminophen ], Cymbalta [duloxetine hcl], Ditropan  [oxybutynin ], Elavil [amitriptyline], Motrin  [ibuprofen ], Naprosyn  [naproxen ], Pyrimethamine, Sulfadoxine, Zestril  [lisinopril ], Nifedical xl [nifedipine ], Reglan  [metoclopramide ], and Trandate  [labetalol ]    Review of Systems  Gastrointestinal:  Positive for abdominal pain.    Updated Vital Signs BP 105/79   Pulse 85   Temp 98.2 F (36.8 C) (Oral)   Resp 18   Ht 1.803 m (5' 11)   Wt 108.9 kg   LMP 12/09/2023 (Approximate)   SpO2 95%   BMI 33.47 kg/m   Physical Exam Vitals and nursing note reviewed.  Constitutional:      General: She is not in acute distress.    Appearance: She is well-developed. She is ill-appearing.  HENT:     Head: Normocephalic and atraumatic.     Right Ear: External ear normal.     Left Ear: External ear normal.  Eyes:     General: No scleral icterus.       Right eye: No discharge.        Left eye: No discharge.     Conjunctiva/sclera: Conjunctivae normal.  Neck:     Trachea: No tracheal deviation.  Cardiovascular:     Rate and Rhythm: Normal rate and regular rhythm.  Pulmonary:     Effort: Pulmonary effort is normal. No respiratory distress.     Breath sounds: Normal breath sounds. No stridor. No wheezing or rales.   Abdominal:     General: Bowel sounds are normal. There is no distension.     Palpations: Abdomen is soft.     Tenderness: There is generalized abdominal tenderness. There is no guarding or rebound.  Musculoskeletal:        General: No tenderness or deformity.     Cervical back: Neck supple.     Right lower leg: No edema.     Left lower leg: No edema.  Skin:    General: Skin is warm and dry.     Findings: No rash.  Neurological:     General: No focal deficit present.     Mental Status: She is alert.     Cranial Nerves: No cranial nerve deficit, dysarthria or facial asymmetry.     Sensory: No sensory deficit.     Motor: No abnormal muscle tone or seizure activity.  Coordination: Coordination normal.  Psychiatric:        Mood and Affect: Mood normal.     (all labs ordered are listed, but only abnormal results are displayed) Labs Reviewed  COMPREHENSIVE METABOLIC PANEL WITH GFR - Abnormal; Notable for the following components:      Result Value   Glucose, Bld 114 (*)    Creatinine, Ser 1.04 (*)    All other components within normal limits  URINALYSIS, ROUTINE W REFLEX MICROSCOPIC - Abnormal; Notable for the following components:   APPearance HAZY (*)    Hgb urine dipstick SMALL (*)    Bacteria, UA RARE (*)    All other components within normal limits  LIPASE, BLOOD  CBC  HCG, SERUM, QUALITATIVE    EKG: None  Radiology: No results found.   Procedures   Medications Ordered in the ED  HYDROmorphone  (DILAUDID ) injection 1 mg (has no administration in time range)  hydrocortisone  sodium succinate  (SOLU-CORTEF ) 100 MG injection 100 mg (100 mg Intravenous Given 01/01/24 2224)  ondansetron  (ZOFRAN ) injection 4 mg (4 mg Intravenous Given 01/01/24 2224)  lactated ringers  bolus 1,000 mL (1,000 mLs Intravenous New Bag/Given 01/01/24 2224)  HYDROmorphone  (DILAUDID ) injection 1 mg (1 mg Intravenous Given 01/01/24 2224)    Clinical Course as of 01/01/24 2314  Fri Jan 01, 2024   2249 Urinalysis, Routine w reflex microscopic -Urine, Clean Catch(!) Urinalysis shows 20 with 15 RBCs no signs of infection [JK]  2249 hCG, serum, qualitative Negative.  CBC normal.  Metabolic panel normal [JK]  2310 Patient states she is feeling better. [JK]    Clinical Course User Index [JK] Randol Simmonds, MD                                 Medical Decision Making Problems Addressed: Abdominal pain, unspecified abdominal location: acute illness or injury that poses a threat to life or bodily functions Nausea and vomiting, unspecified vomiting type: acute illness or injury that poses a threat to life or bodily functions  Amount and/or Complexity of Data Reviewed Labs: ordered. Decision-making details documented in ED Course.  Risk Prescription drug management.   Patient presented to the ED for evaluation of recurrent nausea vomiting abdominal pain.  Patient has a history of recurrent episodes similar to this.  Patient attributes it to her interstitial cystitis.  ED workup reassuring.  No signs of hypotension.  No signs of severe dehydration or anemia.  Patient improved with medications for pain as well as nausea.  She was also given IV fluid.  She was also given a dose of hydrocortisone  as she has not been able to take her oral steroids today.  With her history of having similar episodes in the past I do not feel that advanced imaging is necessary.  Evaluation and diagnostic testing in the emergency department does not suggest an emergent condition requiring admission or immediate intervention beyond what has been performed at this time.  The patient is safe for discharge and has been instructed to return immediately for worsening symptoms, change in symptoms or any other concerns.     Final diagnoses:  Abdominal pain, unspecified abdominal location  Nausea and vomiting, unspecified vomiting type    ED Discharge Orders     None          Randol Simmonds, MD 01/01/24  2314

## 2024-01-01 NOTE — ED Triage Notes (Signed)
 Patient reports feeling fatigue yesterday with a low blood pressure. Today she called her endocrinologist and they suggested she be tested for COVID and Flu and take an extra does of cortisol. States she is having abdominal pain and general body aches. States she has vomited 10 times and has diarrhea.

## 2024-01-01 NOTE — Telephone Encounter (Signed)
 FYI Only or Action Required?: FYI only for provider: UC advised.  Patient was last seen in primary care on 12/16/2023 by Rothfuss, Lang DASEN, PA-C.  Called Nurse Triage reporting Shortness of Breath.  Symptoms began yesterday.  Interventions attempted: Prescription medications: inhaler.  Symptoms are: rapidly worsening.  Triage Disposition: See HCP Within 4 Hours (Or PCP Triage)  Patient/caregiver understands and will follow disposition?: Yes    Copied from CRM #[id number]. Topic: Clinical - Red Word Triage >> Jan 01, 2024  2:20 PM Fonda T wrote: [facility] that prompted transfer to Nurse Triage: Patient calling, states she is having increased cough, with difficulty breathing, joint pain, and runny nose, started experiencing newest symptoms of nausea and vomiting this morning.  Patient reports no fever, but is having chills. Reason for Disposition  [1] Longstanding difficulty breathing (e.g., CHF, COPD, emphysema) AND [2] WORSE than normal  Answer Assessment - Initial Assessment Questions Pt states started with a running nose, yesterday. Today her throat itches, hard to take a deep breath, her chest feels tight, wheezing last night, none today. Does have asthma. Is also having body aches, joint pain, chills, cough.    1. RESPIRATORY STATUS: Describe your breathing? (e.g., wheezing, shortness of breath, unable to speak, severe coughing)      Difficulty to take deep breath 2. ONSET: When did this breathing problem begin?      today 3. PATTERN Does the difficult breathing come and go, or has it been constant since it started?      Intermittent worse with the cough 4. SEVERITY: How bad is your breathing? (e.g., mild, moderate, severe)      Getting worse 5. RECURRENT SYMPTOM: Have you had difficulty breathing before? If Yes, ask: When was the last time? and What happened that time?      Hx of asthma  7. LUNG HISTORY: Do you have any history of lung disease?  (e.g.,  pulmonary embolus, asthma, emphysema)     asthma 8. CAUSE: What do you think is causing the breathing problem?      unsure 9. OTHER SYMPTOMS: Do you have any other symptoms? (e.g., chest pain, cough, dizziness, fever, runny nose)     Cough, body aches, runny nose, joint pain, nausea and vomiting 10. O2 SATURATION MONITOR:  Do you use an oxygen saturation monitor (pulse oximeter) at home? If Yes, ask: What is your reading (oxygen level) today? What is your usual oxygen saturation reading? (e.g., 95%)       no  Protocols used: Breathing Difficulty-A-AH

## 2024-01-01 NOTE — ED Triage Notes (Signed)
 Woke up yesterday with severe head congestion, sore throat, moist cough. States asthma became a problem this morning. Needs covid/flu testing. +smoker.

## 2024-01-01 NOTE — Discharge Instructions (Signed)
 Continue your home medications.  Follow-up with your doctor to be rechecked.  Return to the ER for recurrent symptoms fevers, or inability keep down your medications

## 2024-01-03 ENCOUNTER — Other Ambulatory Visit: Payer: Self-pay

## 2024-01-03 ENCOUNTER — Emergency Department (HOSPITAL_COMMUNITY)
Admission: EM | Admit: 2024-01-03 | Discharge: 2024-01-03 | Disposition: A | Discharge status: Home / Self Care | Payer: MEDICAID | Attending: Emergency Medicine | Admitting: Emergency Medicine

## 2024-01-03 DIAGNOSIS — I10 Essential (primary) hypertension: Secondary | ICD-10-CM | POA: Insufficient documentation

## 2024-01-03 DIAGNOSIS — Z79899 Other long term (current) drug therapy: Secondary | ICD-10-CM | POA: Insufficient documentation

## 2024-01-03 DIAGNOSIS — R1084 Generalized abdominal pain: Secondary | ICD-10-CM | POA: Diagnosis not present

## 2024-01-03 DIAGNOSIS — R112 Nausea with vomiting, unspecified: Secondary | ICD-10-CM | POA: Diagnosis present

## 2024-01-03 DIAGNOSIS — R197 Diarrhea, unspecified: Secondary | ICD-10-CM | POA: Diagnosis not present

## 2024-01-03 LAB — COMPREHENSIVE METABOLIC PANEL WITH GFR
ALT: 24 U/L (ref 0–44)
AST: 22 U/L (ref 15–41)
Albumin: 4.5 g/dL (ref 3.5–5.0)
Alkaline Phosphatase: 71 U/L (ref 38–126)
Anion gap: 11 (ref 5–15)
BUN: 20 mg/dL (ref 6–20)
CO2: 29 mmol/L (ref 22–32)
Calcium: 9.6 mg/dL (ref 8.9–10.3)
Chloride: 97 mmol/L — ABNORMAL LOW (ref 98–111)
Creatinine, Ser: 1.07 mg/dL — ABNORMAL HIGH (ref 0.44–1.00)
GFR, Estimated: >60 mL/min (ref >60)
Glucose, Bld: 96 mg/dL (ref 70–99)
Potassium: 3.2 mmol/L — ABNORMAL LOW (ref 3.5–5.1)
Sodium: 137 mmol/L (ref 135–145)
Total Bilirubin: 0.5 mg/dL (ref 0.0–1.2)
Total Protein: 7.7 g/dL (ref 6.5–8.1)

## 2024-01-03 LAB — CBC
HCT: 43.1 % (ref 36.0–46.0)
Hemoglobin: 14.1 g/dL (ref 12.0–15.0)
MCH: 30.4 pg (ref 26.0–34.0)
MCHC: 32.7 g/dL (ref 30.0–36.0)
MCV: 92.9 fL (ref 80.0–100.0)
Platelets: 294 K/uL (ref 150–400)
RBC: 4.64 MIL/uL (ref 3.87–5.11)
RDW: 13.3 % (ref 11.5–15.5)
WBC: 11 K/uL — ABNORMAL HIGH (ref 4.0–10.5)
nRBC: 0 % (ref 0.0–0.2)

## 2024-01-03 LAB — URINALYSIS, ROUTINE W REFLEX MICROSCOPIC
Bacteria, UA: NONE SEEN
Bilirubin Urine: NEGATIVE
Glucose, UA: NEGATIVE mg/dL
Ketones, ur: NEGATIVE mg/dL
Leukocytes,Ua: NEGATIVE
Nitrite: NEGATIVE
Protein, ur: NEGATIVE mg/dL
Specific Gravity, Urine: 1.008 (ref 1.005–1.030)
pH: 7 (ref 5.0–8.0)

## 2024-01-03 LAB — LIPASE, BLOOD: Lipase: 24 U/L (ref 11–51)

## 2024-01-03 LAB — HCG, SERUM, QUALITATIVE: Preg, Serum: NEGATIVE

## 2024-01-03 MED ORDER — HYDROMORPHONE HCL 1 MG/ML IJ SOLN
1.0000 mg | Freq: Once | INTRAMUSCULAR | Status: AC
  Administered 2024-01-03: 1 mg via INTRAVENOUS
  Filled 2024-01-03: qty 1

## 2024-01-03 MED ORDER — OXYCODONE-ACETAMINOPHEN 5-325 MG PO TABS: 1.0000 | ORAL_TABLET | Freq: Four times a day (QID) | ORAL | 0 refills | Status: AC | PRN

## 2024-01-03 MED ORDER — SODIUM CHLORIDE 0.9 % IV SOLN
25.0000 mg | Freq: Once | INTRAVENOUS | Status: AC
  Administered 2024-01-03: 25 mg via INTRAVENOUS
  Filled 2024-01-03: qty 1

## 2024-01-03 MED ORDER — PROMETHAZINE HCL 25 MG RE SUPP: 25.0000 mg | Freq: Four times a day (QID) | RECTAL | 0 refills | Status: AC | PRN

## 2024-01-03 MED ORDER — SODIUM CHLORIDE 0.9 % IV BOLUS
1000.0000 mL | Freq: Once | INTRAVENOUS | Status: AC
  Administered 2024-01-03: 1000 mL via INTRAVENOUS

## 2024-01-03 MED ORDER — HYDROCORTISONE SOD SUC (PF) 100 MG IJ SOLR
100.0000 mg | Freq: Once | INTRAMUSCULAR | Status: AC
  Administered 2024-01-03: 100 mg via INTRAVENOUS
  Filled 2024-01-03: qty 2

## 2024-01-03 MED ORDER — ONDANSETRON HCL 4 MG/2ML IJ SOLN
4.0000 mg | Freq: Once | INTRAMUSCULAR | Status: AC
  Administered 2024-01-03: 4 mg via INTRAVENOUS
  Filled 2024-01-03: qty 2

## 2024-01-03 NOTE — ED Notes (Addendum)
 Patients o2 was mid 80s, placed patient on 1L [place] and its maintaining around 95%

## 2024-01-03 NOTE — ED Notes (Signed)
 Unable to obtain labs in triage. Pt has poor vasculature. Sts she normally has to get an US  IV.

## 2024-01-03 NOTE — ED Notes (Signed)
 Patient walked to the restroom.

## 2024-01-03 NOTE — Discharge Instructions (Signed)
 As we discussed, your labs were unremarkable today.  I have prescribed Phenergan  as needed for nausea  I have also prescribed short course of Percocet for pain  Please follow-up with your endocrinologist and primary care doctor  Return to ER if you have severe abdominal pain or vomiting or fever

## 2024-01-03 NOTE — ED Triage Notes (Signed)
 Pt c/o lower abdominal pain that started Friday and has not improved. Associated with painful urination, nausea, vomiting, and diarrhea. Was evaluated in the ED for the same on 11/7. Emesis x8 and Loose stools x3 in the last 24 hours. No fevers.

## 2024-01-03 NOTE — ED Provider Notes (Signed)
 [place] EMERGENCY DEPARTMENT AT [facility] Provider Note   CSN: [id number] Arrival date & time: 01/03/24  1902     Patient presents with: Abdominal Pain   Allyiah C Lothamer is a 33 y.o. female history of interstitial cystitis, hypertension, previous necrotizing fasciitis, fibromyalgia here presenting with recurrent abdominal pain and vomiting.  Patient was seen in the ED 2 days ago and received IV medicines.  She states that she started vomiting again and her Zofran  at home did not help.  She states that she is on chronic hydrocortisone  for her adrenal insufficiency and she is unable to keep it down.  She states that she has follow-up with her cardiologist tomorrow but she is concerned about her abdominal pain and vomiting.  Patient also has slightly worsening dysuria as well.   The history is provided by the patient.       Prior to Admission medications   Medication Sig Start Date End Date Taking? Authorizing Provider  acetaminophen  (TYLENOL ) 500 MG tablet Take 1,000 mg by mouth 3 (three) times daily as needed (for pain or headaches).    [provider]  albuterol  (VENTOLIN  HFA) 108 (90 Base) MCG/ACT inhaler Inhale 2 puffs into the lungs every 4 (four) hours as needed for wheezing or shortness of breath. 10/15/22   Luke Orlan HERO, DO  bupivacaine ,PF, (MARCAINE ) 0.5 % SOLN injection Instill 15 mLs into bladder daily with heparin . Patient not taking: Reported on 12/16/2023 04/06/23     EPINEPHrine  0.3 mg/0.3 mL IJ SOAJ injection Inject 0.3 mg into the muscle as needed for anaphylaxis. 01/15/21   Luke Orlan HERO, DO  FLUoxetine  (PROZAC ) 20 MG capsule Take 3 capsules (60 mg total) by mouth daily. 07/22/23 01/01/24  Rothfuss, Jacob T, PA-C  guanFACINE (INTUNIV) 2 MG TB24 ER tablet Take 2 mg by mouth at bedtime.    [provider]  heparin  10000 UNIT/ML injection Instill 4 mLs (40,000 Units total) into the bladder daily with bupivacaine . Patient not taking: Reported on  12/16/2023 04/06/23     hydrALAZINE  (APRESOLINE ) 10 MG tablet Take 1 tablet (10 mg total) by mouth 3 (three) times daily. 10/01/23   Rothfuss, Jacob T, PA-C  hydrocortisone  (CORTEF ) 10 MG tablet Take 5-10 mg by mouth See admin instructions. Take 10 mg by mouth in the morning and take 5 mg by mouth at bedtime.    [provider]  hydrOXYzine  (ATARAX ) 25 MG tablet Take 50 mg by mouth at bedtime.    [provider]  irbesartan -hydrochlorothiazide  (AVALIDE) 150-12.5 MG tablet Take 1 tablet by mouth in the morning and at bedtime. 09/03/23 01/01/24  Rothfuss, Jacob T, PA-C  levothyroxine  (SYNTHROID ) 150 MCG tablet Take 1 tablet (150 mcg total) by mouth daily at 6 (six) AM. 10/06/22 01/01/24  Elnora Ip, MD  linaclotide  (LINZESS ) 145 MCG CAPS capsule Take 1 capsule (145 mcg total) by mouth daily before breakfast. 09/03/23   Rothfuss, Jacob T, PA-C  NON FORMULARY Take 1 capsule by mouth See admin instructions. Beef organ capsules- Take 1 capsule by mouth once a day    [provider]  OLANZapine  (ZYPREXA ) 2.5 MG tablet Take 2.5 mg by mouth daily.    [provider]  OLANZapine  (ZYPREXA ) 7.5 MG tablet Take 7.5 mg by mouth at bedtime.    [provider]  ondansetron  (ZOFRAN -ODT) 4 MG disintegrating tablet Take 1 tablet (4 mg total) by mouth every 8 (eight) hours as needed for nausea or vomiting. 12/03/23   Franklyn Sid SAILOR,  MD  pantoprazole  (PROTONIX ) 40 MG tablet Take 1 tablet (40 mg total) by mouth 2 (two) times daily as needed (for heartburn). Patient taking differently: Take 40 mg by mouth 2 (two) times daily. 10/01/23   Rothfuss, Jacob T, PA-C  pregabalin  (LYRICA ) 25 MG capsule Take 1 capsule (25 mg total) by mouth 2 (two) times daily. Patient taking differently: Take 25 mg by mouth daily. 11/03/23   Rothfuss, Jacob T, PA-C  RASPBERRY PO Take 2 capsules by mouth See admin instructions. Raspberry Leaf capsules- Take 2 capsules by mouth once a day    [provider]  Syringe, Disposable, 20 ML MISC Use as directed for daily bladder instillations 04/06/23     traMADol  (ULTRAM ) 50 MG tablet Take 1 tablet (50 mg total) by mouth every 12 (twelve) hours as needed for moderate pain (pain score 4-6). Patient taking differently: Take 50 mg by mouth every 6 (six) hours as needed for moderate pain (pain score 4-6) or severe pain (pain score 7-10). 04/27/23   Ezenduka, Nkeiruka J, MD    Allergies: Bee venom, Coreg  [carvedilol ], Red dye #40 (allura red), Compazine  [prochlorperazine  maleate], Vicodin [hydrocodone -acetaminophen ], Cymbalta [duloxetine hcl], Ditropan  [oxybutynin ], Elavil [amitriptyline], Motrin  [ibuprofen ], Naprosyn  [naproxen ], Pyrimethamine, Sulfadoxine, Zestril  [lisinopril ], Nifedical xl [nifedipine ], Reglan  [metoclopramide ], and Trandate  [labetalol ]    Review of Systems  Gastrointestinal:  Positive for abdominal pain and vomiting.  All other systems reviewed and are negative.   Updated Vital Signs BP (!) 146/93 (BP Location: Right Arm)   Pulse 82   Temp 98.2 F (36.8 C) (Oral)   Resp 19   LMP 01/03/2024 (Approximate)   SpO2 99%   Physical Exam Vitals and nursing note reviewed.  Constitutional:      Comments: Uncomfortable and dehydrated and chronically ill  HENT:     Head: Normocephalic.     Mouth/Throat:     Pharynx: Oropharynx is clear.  Eyes:     Extraocular Movements: Extraocular movements intact.     Pupils: Pupils are equal, round, and reactive to light.  Cardiovascular:     Rate and Rhythm: Normal rate and regular rhythm.     Heart sounds: Normal heart sounds.  Pulmonary:     Effort: Pulmonary effort is normal.     Breath sounds: Normal breath sounds.  Abdominal:     Comments: Mild epigastric tenderness  Skin:    General: Skin is warm.     Capillary Refill: Capillary refill takes less than 2 seconds.  Neurological:     General: No focal deficit present.     Mental Status: She is oriented to person, place, and  time.  Psychiatric:        Mood and Affect: Mood normal.        Behavior: Behavior normal.     (all labs ordered are listed, but only abnormal results are displayed) Labs Reviewed  LIPASE, BLOOD  COMPREHENSIVE METABOLIC PANEL WITH GFR  CBC  URINALYSIS, ROUTINE W REFLEX MICROSCOPIC  HCG, SERUM, QUALITATIVE    EKG: None  Radiology: No results found.   Procedures   Medications Ordered in the ED  sodium chloride  0.9 % bolus 1,000 mL (has no administration in time range)  ondansetron  (ZOFRAN ) injection 4 mg (has no administration in time range)  promethazine  (PHENERGAN ) 25 mg in sodium chloride  0.9 % 50 mL IVPB (has no administration in time range)  HYDROmorphone  (DILAUDID ) injection 1 mg (has no administration in time range)  hydrocortisone  sodium succinate  (SOLU-CORTEF ) 100 MG injection 100 mg (  has no administration in time range)                                    Medical Decision Making Jinnie C Pond is a 34 y.o. female here presenting with abdominal pain and vomiting and diarrhea.  This is a chronic issue.  Patient had multiple workup for this previously.  Consider worsening interstitial cystitis versus adrenal insufficiency versus electrolyte abnormality.  Plan to get CBC and CMP and lipase and urinalysis.  Will hydrate patient and give pain medicine and IV steroids.   10:21 PM I reviewed patient labs and they were unremarkable.  In particular urinalysis did not show any UTI.  Patient is feeling better after meds.  She has endocrine follow-up tomorrow.  Stable for discharge  Problems Addressed: Generalized abdominal pain: acute illness or injury Nausea and vomiting, unspecified vomiting type: acute illness or injury  Amount and/or Complexity of Data Reviewed Labs: ordered. Decision-making details documented in ED Course.  Risk Prescription drug management.    Final diagnoses:  None    ED Discharge Orders     None          Patt Alm Macho,  MD 01/03/24 2222

## 2024-01-05 ENCOUNTER — Ambulatory Visit (HOSPITAL_BASED_OUTPATIENT_CLINIC_OR_DEPARTMENT_OTHER)
Admission: EM | Admit: 2024-01-05 | Discharge: 2024-01-05 | Disposition: A | Discharge status: Home / Self Care | Payer: MEDICAID | Attending: Family Medicine | Admitting: Family Medicine

## 2024-01-05 ENCOUNTER — Ambulatory Visit (HOSPITAL_BASED_OUTPATIENT_CLINIC_OR_DEPARTMENT_OTHER): Admit: 2024-01-05 | Discharge: 2024-01-05 | Disposition: A | Payer: MEDICAID | Admitting: Radiology

## 2024-01-05 ENCOUNTER — Encounter (HOSPITAL_BASED_OUTPATIENT_CLINIC_OR_DEPARTMENT_OTHER): Payer: Self-pay

## 2024-01-05 DIAGNOSIS — J208 Acute bronchitis due to other specified organisms: Secondary | ICD-10-CM

## 2024-01-05 DIAGNOSIS — J0140 Acute pansinusitis, unspecified: Secondary | ICD-10-CM

## 2024-01-05 DIAGNOSIS — R051 Acute cough: Secondary | ICD-10-CM

## 2024-01-05 MED ORDER — ALBUTEROL SULFATE HFA 108 (90 BASE) MCG/ACT IN AERS: 2.0000 | INHALATION_SPRAY | RESPIRATORY_TRACT | 0 refills | Status: AC | PRN

## 2024-01-05 MED ORDER — IPRATROPIUM-ALBUTEROL 0.5-2.5 (3) MG/3ML IN SOLN
3.0000 mL | Freq: Once | RESPIRATORY_TRACT | Status: AC
  Administered 2024-01-05: 3 mL via RESPIRATORY_TRACT

## 2024-01-05 MED ORDER — ONDANSETRON 4 MG PO TBDP: 4.0000 mg | ORAL_TABLET | Freq: Once | ORAL | Status: DC

## 2024-01-05 MED ORDER — METHYLPREDNISOLONE ACETATE 80 MG/ML IJ SUSP
80.0000 mg | Freq: Once | INTRAMUSCULAR | Status: AC
  Administered 2024-01-05: 80 mg via INTRAMUSCULAR

## 2024-01-05 MED ORDER — DOXYCYCLINE HYCLATE 100 MG PO CAPS: 100.0000 mg | ORAL_CAPSULE | Freq: Two times a day (BID) | ORAL | 0 refills | Status: AC

## 2024-01-05 NOTE — ED Provider Notes (Addendum)
 [facility]    CSN: [id number] Arrival date & time: 01/05/24  1823      History   Chief Complaint Chief Complaint  Patient presents with   Cough   Nasal Congestion    HPI Krystal [name] is a 33 y.o. female.   Patient was seen on 01/01/2024 here at urgent care.  She does have adrenal insufficiency and a multitude of other additions.  She had spoken to her endocrinologist that morning.  She was advised to go to urgent care and get tested for COVID and flu.  If she was negative for COVID and flu, she was advised to go to an emergency room for further evaluation, labs, possible IV fluids and possible steroid injection.  Her COVID and flu were negative here at urgent care.  She was seen at the emergency room on 01/01/2024.  She was given Solu-Cortef , ondansetron , promethazine , hydromorphone  for abdominal pain, IV fluids and she had lab work done.  She went back to the emergency room on 01/03/2024 and was given more IV fluids, ondansetron , hydromorphone .  She is reporting cough, chills, nasal congestion and reporting that her symptoms have not improved at all since 01/01/2024.    The patient has been on hydrocortisone  10 mg in the morning and 5 mg in the evening since she was diagnosed with adrenal insufficiency.  Her initial respiratory symptoms were treated on 01/01/2024 at the emergency room and part of that included Solu-Cortef  IV.  Her endocrinologist increased her hydrocortisone  to 20 mg in the morning and 10 mg in the evening on 01/02/2024.  She has continued to have worsening respiratory symptoms with shortness of breath while on the higher dose of hydrocortisone .     Cough Associated symptoms: chills   Associated symptoms: no chest pain, no ear pain, no fever, no rash, no shortness of breath and no sore throat     Past Medical History:  Diagnosis Date   Acute acalculous cholecystitis s/p lap cholecystectomy 05/11/2020 05/11/2020   Angio-edema 01/15/2021   Anxiety     Asthma    Chronic interstitial cystitis    Depression    Fibromyalgia    HELLP (hemolytic anemia/elev liver enzymes/low platelets in pregnancy) 12/08/2017   Hemorrhagic cyst of left ovary 04/15/2022   History of attempted suicide    as a teenager   History of borderline personality disorder    History of HELLP syndrome, currently pregnant 04/21/2019   Formatting of this note might be different from the original.  History of HELLP syndrome at 26 weeks 5 days (12/07/2017); admitted to [facility] with BP 200/120s, Plt 52, AST/ALT 471/375  APS evaluation negative (04/2019)   Daily ASA 81 mg   Challenging blood pressure control in current pregnancy      Plan:  - Baseline pre-eclampsia labs this pregnancy within normal limits (Cr: 0.71; urine p:c 0.106)  -    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 03/05/2022   Hypothyroidism due to Hashimoto's thyroiditis    IC (interstitial cystitis)    Insomnia    Kidney stones    Migraines    Miscarriage 09/2020   Necrotizing fasciitis (HCC)    left hand amputation   PTSD (post-traumatic stress disorder)    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   Secondary adrenal insufficiency    Severe uncontrolled hypertension 10/08/2017   UTI (urinary tract infection)     Patient Active Problem List   Diagnosis Date Noted   Intractable vomiting 12/07/2023   Daytime somnolence 10/01/2023  Chronic idiopathic constipation 09/04/2023   Bee allergy status 07/09/2023   BMI 36.0-36.9,adult 07/09/2023   Pelvic pain, chronic 07/04/2023   Anxiety and depression 07/04/2023   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/30/2023   Intractable nausea and vomiting 03/13/2023   Intractable vomiting with nausea 03/13/2023   Encounter for screening involving social determinants of health (SDoH) 10/07/2022   Chronic interstitial cystitis 05/05/2022   Chronic diarrhea 04/29/2022   Lower abdominal pain 04/28/2022   Adrenal insufficiency 04/26/2022   Bradycardia 04/24/2022    Moderate persistent asthma with acute exacerbation 12/04/2021   Cervicalgia 10/31/2021   Borderline personality disorder in adult [facility]) 10/21/2021   Anisocoria 07/02/2021   History of excision of intestinal structure 05/23/2021   Attention deficit disorder 04/17/2021   Chronic fatigue syndrome 04/17/2021   Anosmia 04/17/2021   Chronic urticaria 01/15/2021   Local reaction to hymenoptera sting 01/15/2021   Perennial allergic rhinitis 01/15/2021   MDD (major depressive disorder), recurrent episode, severe (HCC) 11/19/2020   Fibromyalgia 05/11/2020   Gastroesophageal reflux disease 05/11/2020   Interstitial cystitis 05/11/2020   PTSD (post-traumatic stress disorder) 05/11/2020   Primary insomnia 05/11/2020   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 ENDOSCOPY;  Service: Gastroenterology;;   CESAREAN SECTION N/A 12/06/2017   Procedure: CESAREAN SECTION;  Surgeon: Nicholaus Burnard HERO, MD;  Location: [facility] BIRTHING SUITES;  Service: Obstetrics;  Laterality: N/A;   CESAREAN SECTION  07/21/2021   Procedure: CESAREAN SECTION;  Surgeon: Fredirick Glenys RAMAN, MD;  Location: [facility] LD ORS;  Service: Obstetrics;;   CHOLECYSTECTOMY N/A 05/11/2020   Procedure: SINGLE SITE LAPAROSCOPIC CHOLECYSTECTOMY AND LIVER BIOSY;  Surgeon: Sheldon Elspeth, MD;  Location: [facility] ORS;  Service: General;  Laterality: N/A;   ESOPHAGOGASTRODUODENOSCOPY (EGD) WITH PROPOFOL  N/A 05/10/2020    Procedure: ESOPHAGOGASTRODUODENOSCOPY (EGD) WITH PROPOFOL ;  Surgeon: Burnette Fallow, MD;  Location: [facility] ENDOSCOPY;  Service: Endoscopy;  Laterality: N/A;   FLEXIBLE SIGMOIDOSCOPY N/A 05/02/2022   Procedure: FLEXIBLE SIGMOIDOSCOPY;  Surgeon: Leigh Elspeth SQUIBB, MD;  Location: [facility] ENDOSCOPY;  Service: Gastroenterology;  Laterality: N/A;   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   HAND RECONSTRUCTION Right    INCISION AND DRAINAGE     LAPAROSCOPIC LYSIS OF ADHESIONS  04/22/2022   Procedure: LAPAROSCOPIC LYSIS OF ADHESIONS;  Surgeon: Izell Harari, MD;  Location: [facility] OR;  Service: Gynecology;;   LAPAROSCOPIC OVARIAN CYSTECTOMY Left 04/22/2022   Procedure: LAPAROSCOPIC LEFT OVARIAN CYSTECTOMY;  Surgeon: Izell Harari, MD;  Location: [facility] OR;  Service: Gynecology;  Laterality: Left;   NASAL SEPTUM SURGERY      OB History     Gravida  5   Para  3   Term  0   Preterm  3   AB  2   Living  2      SAB  2   IAB  0   Ectopic  0   Multiple  0   Live Births  2  Home Medications    Prior to Admission medications   Medication Sig Start Date End Date Taking? Authorizing Provider  doxycycline  (VIBRAMYCIN ) 100 MG capsule Take 1 capsule (100 mg total) by mouth 2 (two) times daily for 10 days. 01/05/24 01/15/24 Yes Ival Domino, FNP  acetaminophen  (TYLENOL ) 500 MG tablet Take 1,000 mg by mouth 3 (three) times daily as needed (for pain or headaches).    [provider]  albuterol  (VENTOLIN  HFA) 108 (90 Base) MCG/ACT inhaler Inhale 2 puffs into the lungs every 4 (four) hours as needed for wheezing or shortness of breath. 01/05/24   Ival Domino, FNP  bupivacaine ,PF, (MARCAINE ) 0.5 % SOLN injection Instill 15 mLs into bladder daily with heparin . Patient not taking: Reported on 12/16/2023 04/06/23     EPINEPHrine  0.3 mg/0.3 mL IJ SOAJ injection Inject 0.3 mg into the muscle as needed for anaphylaxis. 01/15/21   Luke Orlan HERO, DO  FLUoxetine  (PROZAC ) 20  MG capsule Take 3 capsules (60 mg total) by mouth daily. 07/22/23 01/01/24  Rothfuss, Jacob T, PA-C  guanFACINE (INTUNIV) 2 MG TB24 ER tablet Take 2 mg by mouth at bedtime.    [provider]  heparin  10000 UNIT/ML injection Instill 4 mLs (40,000 Units total) into the bladder daily with bupivacaine . Patient not taking: Reported on 12/16/2023 04/06/23     hydrALAZINE  (APRESOLINE ) 10 MG tablet Take 1 tablet (10 mg total) by mouth 3 (three) times daily. 10/01/23   Rothfuss, Jacob T, PA-C  hydrocortisone  (CORTEF ) 10 MG tablet Take 5-10 mg by mouth See admin instructions. Take 10 mg by mouth in the morning and take 5 mg by mouth at bedtime.    [provider]  hydrOXYzine  (ATARAX ) 25 MG tablet Take 50 mg by mouth at bedtime.    [provider]  irbesartan -hydrochlorothiazide  (AVALIDE) 150-12.5 MG tablet Take 1 tablet by mouth in the morning and at bedtime. 09/03/23 01/01/24  Rothfuss, Jacob T, PA-C  levothyroxine  (SYNTHROID ) 150 MCG tablet Take 1 tablet (150 mcg total) by mouth daily at 6 (six) AM. 10/06/22 01/01/24  Elnora Ip, MD  linaclotide  (LINZESS ) 145 MCG CAPS capsule Take 1 capsule (145 mcg total) by mouth daily before breakfast. 09/03/23   Rothfuss, Jacob T, PA-C  NON FORMULARY Take 1 capsule by mouth See admin instructions. Beef organ capsules- Take 1 capsule by mouth once a day    [provider]  OLANZapine  (ZYPREXA ) 2.5 MG tablet Take 2.5 mg by mouth daily.    [provider]  OLANZapine  (ZYPREXA ) 7.5 MG tablet Take 7.5 mg by mouth at bedtime.    [provider]  ondansetron  (ZOFRAN -ODT) 4 MG disintegrating tablet Take 1 tablet (4 mg total) by mouth every 8 (eight) hours as needed for nausea or vomiting. 12/03/23   Franklyn Sid SAILOR, MD  oxyCODONE -acetaminophen  (PERCOCET) 5-325 MG tablet Take 1 tablet by mouth every 6 (six) hours as needed. 01/03/24   Patt Alm Macho, MD  pantoprazole  (PROTONIX ) 40 MG tablet Take 1 tablet (40 mg total) by  mouth 2 (two) times daily as needed (for heartburn). Patient taking differently: Take 40 mg by mouth 2 (two) times daily. 10/01/23   Rothfuss, Jacob T, PA-C  pregabalin  (LYRICA ) 25 MG capsule Take 1 capsule (25 mg total) by mouth 2 (two) times daily. Patient taking differently: Take 25 mg by mouth daily. 11/03/23   Rothfuss, Jacob T, PA-C  promethazine  (PHENERGAN ) 25 MG suppository Place 1 suppository (25 mg total) rectally every 6 (six) hours as needed for nausea  or vomiting. 01/03/24   Patt Alm Macho, MD  RASPBERRY PO Take 2 capsules by mouth See admin instructions. Raspberry Leaf capsules- Take 2 capsules by mouth once a day    [provider]  Syringe, Disposable, 20 ML MISC Use as directed for daily bladder instillations 04/06/23     traMADol  (ULTRAM ) 50 MG tablet Take 1 tablet (50 mg total) by mouth every 12 (twelve) hours as needed for moderate pain (pain score 4-6). Patient taking differently: Take 50 mg by mouth every 6 (six) hours as needed for moderate pain (pain score 4-6) or severe pain (pain score 7-10). 04/27/23   Donnamarie Lebron PARAS, MD    Family History Family History  Problem Relation Age of Onset   Hypertension Mother    Coronary artery disease Mother    Migraines Mother    Hypertension Father    Stroke Father    Atrial fibrillation Father    Thyroid  disease Sister    Clotting disorder Sister    Asthma Sister    Migraines Sister    Thyroid  disease Sister    Asthma Sister    Migraines Sister    Asthma Brother        oldest   Migraines Brother    Hypertension Brother    Gout Brother    Asthma Brother    Migraines Brother    Asthma Brother    Migraines Brother    Heart failure Maternal Grandfather    Heart attack Maternal Grandfather     Social History Social History   Tobacco Use   Smoking status: Never    Passive exposure: Never   Smokeless tobacco: Never   Tobacco comments:    She tried a cigarette and never smoked again  Vaping Use   Vaping  status: Some Days   Devices: DELTA- 8  Substance Use Topics   Alcohol use: Not Currently    Comment: NOT CURRENTLY DRINKING 11/12/2023   Drug use: Yes    Comment: DELTA-8 occasionally     Allergies   Bee venom, Coreg  [carvedilol ], Red dye #40 (allura red), Compazine  [prochlorperazine  maleate], Vicodin [hydrocodone -acetaminophen ], Cymbalta [duloxetine hcl], Ditropan  [oxybutynin ], Elavil [amitriptyline], Motrin  [ibuprofen ], Naprosyn  [naproxen ], Pyrimethamine, Sulfadoxine, Zestril  [lisinopril ], Nifedical xl [nifedipine ], Reglan  [metoclopramide ], and Trandate  [labetalol ]   Review of Systems Review of Systems  Constitutional:  Positive for chills. Negative for fever.  HENT:  Positive for congestion. Negative for ear pain and sore throat.   Eyes:  Negative for pain and visual disturbance.  Respiratory:  Positive for cough. Negative for shortness of breath.   Cardiovascular:  Negative for chest pain and palpitations.  Gastrointestinal:  Negative for abdominal pain, constipation, diarrhea, nausea and vomiting.  Genitourinary:  Negative for dysuria and hematuria.  Musculoskeletal:  Negative for arthralgias and back pain.  Skin:  Negative for color change and rash.  Neurological:  Negative for seizures and syncope.  All other systems reviewed and are negative.    Physical Exam Triage Vital Signs ED Triage Vitals  Encounter Vitals Group     BP 01/05/24 1857 113/81     Girls Systolic BP Percentile --      Girls Diastolic BP Percentile --      Boys Systolic BP Percentile --      Boys Diastolic BP Percentile --      Pulse Rate 01/05/24 1857 76     Resp 01/05/24 1857 18     Temp 01/05/24 1857 98.3 F (36.8 C)     Temp Source  01/05/24 1857 Oral     SpO2 01/05/24 1857 94 %     Weight --      Height --      Head Circumference --      Peak Flow --      Pain Score 01/05/24 1855 7     Pain Loc --      Pain Education --      Exclude from Growth Chart --    No data found.  Updated  Vital Signs BP 113/81 (BP Location: Right Arm)   Pulse 76   Temp 98.3 F (36.8 C) (Oral)   Resp 18   LMP 01/03/2024 (Approximate)   SpO2 94%   Visual Acuity Right Eye Distance:   Left Eye Distance:   Bilateral Distance:    Right Eye Near:   Left Eye Near:    Bilateral Near:     Physical Exam Vitals and nursing note reviewed.  Constitutional:      General: She is not in acute distress.    Appearance: She is well-developed. She is ill-appearing. She is not toxic-appearing or diaphoretic.  HENT:     Head: Normocephalic and atraumatic.     Right Ear: Hearing, tympanic membrane, ear canal and external ear normal.     Left Ear: Hearing, tympanic membrane, ear canal and external ear normal.     Nose: Congestion and rhinorrhea present.     Right Sinus: Maxillary sinus tenderness and frontal sinus tenderness present.     Left Sinus: Maxillary sinus tenderness and frontal sinus tenderness present.     Mouth/Throat:     Lips: Pink.     Mouth: Mucous membranes are moist.     Pharynx: Uvula midline. No oropharyngeal exudate or posterior oropharyngeal erythema.     Tonsils: No tonsillar exudate.  Eyes:     Conjunctiva/sclera: Conjunctivae normal.     Pupils: Pupils are equal, round, and reactive to light.  Cardiovascular:     Rate and Rhythm: Normal rate and regular rhythm.     Heart sounds: S1 normal and S2 normal. No murmur heard. Pulmonary:     Effort: Pulmonary effort is normal. No respiratory distress.     Breath sounds: Examination of the right-upper field reveals wheezing. Examination of the left-upper field reveals wheezing. Examination of the right-middle field reveals wheezing. Examination of the left-middle field reveals wheezing. Examination of the right-lower field reveals wheezing. Examination of the left-lower field reveals wheezing. Wheezing (Inspiratory and expiratory wheezes throughout.) present. No decreased breath sounds, rhonchi or rales.     Comments: Reassessment  after DuoNeb treatment: Oxygen saturation went from 92% to 98-99% on room air.  Wheezes resolved with use of the DuoNeb treatment.  Patient felt like her shortness of breath resolved with the DuoNeb treatment. Abdominal:     General: Bowel sounds are normal.     Palpations: Abdomen is soft.     Tenderness: There is no abdominal tenderness.  Musculoskeletal:        General: No swelling.     Cervical back: Neck supple.     Comments: Left hand absent/previous amputation.  Right hand with significant scarring.  Lymphadenopathy:     Head:     Right side of head: No submental, submandibular, tonsillar, preauricular or posterior auricular adenopathy.     Left side of head: No submental, submandibular, tonsillar, preauricular or posterior auricular adenopathy.     Cervical: Cervical adenopathy present.     Right cervical: Superficial cervical adenopathy present.  Left cervical: Superficial cervical adenopathy present.  Skin:    General: Skin is warm and dry.     Capillary Refill: Capillary refill takes less than 2 seconds.     Findings: No rash.  Neurological:     Mental Status: She is alert and oriented to person, place, and time.  Psychiatric:        Mood and Affect: Mood normal.      UC Treatments / Results  Labs (all labs ordered are listed, but only abnormal results are displayed) Labs Reviewed - No data to display  EKG   Radiology DG Chest 2 View Result Date: 01/05/2024 EXAM: 2 VIEW(S) XRAY OF THE CHEST 01/05/2024 08:00:44 PM COMPARISON: 11/03/2023 CLINICAL HISTORY: cough FINDINGS: LUNGS AND PLEURA: No focal pulmonary opacity. No pulmonary edema. No pleural effusion. No pneumothorax. HEART AND MEDIASTINUM: No acute abnormality of the cardiac and mediastinal silhouettes. BONES AND SOFT TISSUES: No acute osseous abnormality. IMPRESSION: 1. No acute cardiopulmonary process. Electronically signed by: Pinkie Pebbles MD 01/05/2024 08:05 PM EST RP Workstation: [id number]     Procedures Procedures (including critical care time)  Medications Ordered in UC Medications  ipratropium-albuterol  (DUONEB) 0.5-2.5 (3) MG/3ML nebulizer solution 3 mL (3 mLs Nebulization Given 01/05/24 2003)  methylPREDNISolone  acetate (DEPO-MEDROL ) injection 80 mg (80 mg Intramuscular Given 01/05/24 2034)    Initial Impression / Assessment and Plan / UC Course  I have reviewed the triage vital signs and the nursing notes.  Pertinent labs & imaging results that were available during my care of the patient were reviewed by me and considered in my medical decision making (see chart for details).  Clinical Course as of 01/05/24 2042  Tue Jan 05, 2024  2007 DG Chest 2 View [SJ]  2008 DG Chest 2 View [SJ]    Clinical Course User Index [SJ] Ival Domino, FNP   Plan of Care: Sinusitis and acute bronchitis with cough: Patient has developed significant shortness of breath and wheezing while on higher dose steroids.  Adrenal insufficiency is a newer diagnosis for her and she is not at 100% sure what to do next but she knew she could not breathe well.  Her chest x-ray is negative for pneumonia or acute cardiopulmonary process.  She had a huge improvement in her wheezing and shortness of breath with a DuoNeb treatment.  She does not have a diagnosis of asthma or chronic lung disease so I cannot send a nebulizer machine home with her for an acute illness.   Will treat for acute bronchitis with an albuterol  inhaler, 2 puffs, every 4 hours if needed for wheezing.  Given Depo-Medrol  80 mg injection now for the bronchitis symptoms.  The patient will see endocrinology tomorrow and discuss appropriate steroid use during this time of acute illness.  Acute sinusitis will be treated with doxycycline  100 mg twice daily for 10 days.  Encouraged sinus rinses with sinus rinse bottle solution twice daily.  Get plenty of fluids and rest.  Use over-the-counter cough syrup as needed.  Promethazine  DM was  contraindicated due to her allergies.  Follow-up with primary care if symptoms do not improve, worsen or new symptoms occur.  I reviewed the plan of care with the patient and/or the patient's guardian.  The patient and/or guardian had time to ask questions and acknowledged that the questions were answered.  I provided instruction on symptoms or reasons to return here or to go to an ER, if symptoms/condition did not improve, worsened or if new symptoms occurred.  Final  Clinical Impressions(s) / UC Diagnoses   Final diagnoses:  Acute cough  Acute viral bronchitis  Acute non-recurrent pansinusitis     Discharge Instructions      Acute bronchitis and sinusitis with cough: Patient is regularly hydrocortisone  for adrenal insufficiency.  Currently she is on a double dose which is 20 mg daily and 10 mg nightly.  She is to see endocrinology tomorrow.  She does not know what to do about dosing because she has been on this higher dose for 5 days and she has gotten worse with her chest congestion on the higher dose.  Will give Depo-Medrol  80 mg now.  Discussed the whole cortisone issue with the patient and she will talk with endocrinology tomorrow.  Refilled albuterol  inhaler, 2 puffs, every 4 hours if needed for wheezing.  Sinusitis: Doxycycline  100 mg twice daily for 10 days.  Encouraged sinus rinses.  Follow-up with primary care if symptoms do not improve, worsen or new symptoms occur.     ED Prescriptions     Medication Sig Dispense Auth. Provider   doxycycline  (VIBRAMYCIN ) 100 MG capsule Take 1 capsule (100 mg total) by mouth 2 (two) times daily for 10 days. 20 capsule Ival Domino, FNP   albuterol  (VENTOLIN  HFA) 108 (90 Base) MCG/ACT inhaler Inhale 2 puffs into the lungs every 4 (four) hours as needed for wheezing or shortness of breath. 6.7 g Ival Domino, FNP      PDMP not reviewed this encounter.   Ival Domino, FNP 01/05/24 2041    Ival Domino, FNP 01/05/24 2042

## 2024-01-05 NOTE — Discharge Instructions (Addendum)
 Acute bronchitis and sinusitis with cough: Patient is regularly hydrocortisone  for adrenal insufficiency.  Currently she is on a double dose which is 20 mg daily and 10 mg nightly.  She is to see endocrinology tomorrow.  She does not know what to do about dosing because she has been on this higher dose for 5 days and she has gotten worse with her chest congestion on the higher dose.  Will give Depo-Medrol  80 mg now.  Discussed the whole cortisone issue with the patient and she will talk with endocrinology tomorrow.  Refilled albuterol  inhaler, 2 puffs, every 4 hours if needed for wheezing.  Sinusitis: Doxycycline  100 mg twice daily for 10 days.  Encouraged sinus rinses.  Follow-up with primary care if symptoms do not improve, worsen or new symptoms occur.

## 2024-01-05 NOTE — ED Triage Notes (Signed)
 Pt was seen on 11/7, symptoms have not got any better. Pt c/o of cough,chills, nasal congestion since Friday denies fever. Pt has not taken any medication.

## 2024-01-11 ENCOUNTER — Ambulatory Visit: Payer: MEDICAID | Admitting: Gastroenterology

## 2024-01-11 ENCOUNTER — Other Ambulatory Visit: Payer: MEDICAID

## 2024-01-11 ENCOUNTER — Encounter: Payer: Self-pay | Admitting: Gastroenterology

## 2024-01-11 ENCOUNTER — Ambulatory Visit: Payer: Self-pay | Admitting: Gastroenterology

## 2024-01-11 VITALS — BP 126/82 | HR 64 | Ht 71.0 in | Wt 245.0 lb

## 2024-01-11 DIAGNOSIS — K581 Irritable bowel syndrome with constipation: Secondary | ICD-10-CM

## 2024-01-11 DIAGNOSIS — Z8619 Personal history of other infectious and parasitic diseases: Secondary | ICD-10-CM

## 2024-01-11 DIAGNOSIS — R112 Nausea with vomiting, unspecified: Secondary | ICD-10-CM | POA: Diagnosis not present

## 2024-01-11 DIAGNOSIS — K219 Gastro-esophageal reflux disease without esophagitis: Secondary | ICD-10-CM

## 2024-01-11 DIAGNOSIS — R1013 Epigastric pain: Secondary | ICD-10-CM

## 2024-01-11 DIAGNOSIS — G8929 Other chronic pain: Secondary | ICD-10-CM

## 2024-01-11 LAB — CBC WITH DIFFERENTIAL/PLATELET
Basophils Absolute: 0 K/uL (ref 0.0–0.1)
Basophils Relative: 0.3 % (ref 0.0–3.0)
Eosinophils Absolute: 0 K/uL (ref 0.0–0.7)
Eosinophils Relative: 0.1 % (ref 0.0–5.0)
HCT: 40.5 % (ref 36.0–46.0)
Hemoglobin: 13.8 g/dL (ref 12.0–15.0)
Lymphocytes Relative: 23.2 % (ref 12.0–46.0)
Lymphs Abs: 2.2 K/uL (ref 0.7–4.0)
MCHC: 34 g/dL (ref 30.0–36.0)
MCV: 93 fl (ref 78.0–100.0)
Monocytes Absolute: 0.8 K/uL (ref 0.1–1.0)
Monocytes Relative: 8.6 % (ref 3.0–12.0)
Neutro Abs: 6.3 K/uL (ref 1.4–7.7)
Neutrophils Relative %: 67.8 % (ref 43.0–77.0)
Platelets: 327 K/uL (ref 150.0–400.0)
RBC: 4.35 Mil/uL (ref 3.87–5.11)
RDW: 13.8 % (ref 11.5–15.5)
WBC: 9.3 K/uL (ref 4.0–10.5)

## 2024-01-11 LAB — BASIC METABOLIC PANEL WITH GFR
BUN: 24 mg/dL — ABNORMAL HIGH (ref 6–23)
CO2: 28 meq/L (ref 19–32)
Calcium: 9.6 mg/dL (ref 8.4–10.5)
Chloride: 99 meq/L (ref 96–112)
Creatinine, Ser: 1.05 mg/dL (ref 0.40–1.20)
GFR: 68.93 mL/min (ref >60.00)
Glucose, Bld: 87 mg/dL (ref 70–99)
Potassium: 4 meq/L (ref 3.5–5.1)
Sodium: 136 meq/L (ref 135–145)

## 2024-01-11 MED ORDER — FAMOTIDINE 20 MG PO TABS: 20.0000 mg | ORAL_TABLET | Freq: Two times a day (BID) | ORAL | 2 refills | Status: AC

## 2024-01-11 NOTE — Patient Instructions (Addendum)
 GERD Recommend GERD diet Continue pantoprazole  40mg  twice daily Add Pepcid  20mg  at bedtime Avoid eating 3-4 hours before lying down  Nausea Continue antiemetics as needed  Constipation Samples Linzess  290mcg po daily, take 1 capsule 30-45 mins before first meal of the day with full glass of water Minimize narcotics.   Your provider has requested that you go to the basement level for lab work before leaving today. Press B on the elevator. The lab is located at the first door on the left as you exit the elevator.  Your provider has ordered Diatherix stool testing for you. You have received a kit from our office today containing all necessary supplies to complete this test. Please carefully read the stool collection instructions provided in the kit before opening the accompanying materials. In addition, be sure there is a label providing your full name and date of birth on the puritan opti-swab tube that is supplied in the kit (if you do not see a label with this information on your test tube, please make us  aware before test collection!). After completing the test, you should secure the purtian tube into the specimen biohazard bag. The [facility] E-Req sheet (including date and time of specimen collection) should be placed into the outside pocket of the specimen biohazard bag and returned to the [facility] lab (basement floor of [facility]) within 3 days of collection. Please make sure to give the specimen to a staff member at the lab. DO NOT leave the specimen on the counter.   If the specimen date and time (can be found in the upper right boxed portion of the sheet) are not filled out on the E-Req sheet, the test will NOT be performed.   _______________________________________________________  If your blood pressure at your visit was 140/90 or greater, please contact your primary care physician to follow up on  this.  _______________________________________________________  If you are age 48 or older, your body mass index should be between 23-30. Your Body mass index is 34.17 kg/m. If this is out of the aforementioned range listed, please consider follow up with your Primary Care Provider.  If you are age 6 or younger, your body mass index should be between 19-25. Your Body mass index is 34.17 kg/m. If this is out of the aformentioned range listed, please consider follow up with your Primary Care Provider.   ________________________________________________________  The [facility] providers would like to encourage you to use MYCHART to communicate with providers for non-urgent requests or questions.  Due to long hold times on the telephone, sending your provider a message by [facility] may be a faster and more efficient way to get a response.  Please allow 48 business hours for a response.  Please remember that this is for non-urgent requests.  _______________________________________________________  [facility] is using a team-based approach to care.  Your team is made up of your doctor and two to three APPS. Our APPS (Nurse Practitioners and Physician Assistants) work with your physician to ensure care continuity for you. They are fully qualified to address your health concerns and develop a treatment plan. They communicate directly with your gastroenterologist to care for you. Seeing the Advanced Practice Practitioners on your physician's team can help you by facilitating care more promptly, often allowing for earlier appointments, access to diagnostic testing, procedures, and other specialty referrals.   Thank you for trusting me with your gastrointestinal care. Deanna May, FNP-C

## 2024-01-11 NOTE — Progress Notes (Signed)
 Chief Complaint:GERD Primary GI Doctor: Dr. Leigh   HPI:  Patient is a  33  year old female patient with past medical history of adrenal insufficiency, interstitial cystitis, hypertension, GERD, constipation, and fibromyalgia, who was referred to me by Rothfuss, Lang DASEN, PA-C on 10/01/23 for a evaluation of GERD .    01/05/24 seen in urgent care for Sinusitis and acute bronchitis with cough. Given steroids and albuterol  inhaler for bronchitis. Doxycycline  for sinusitis.  01/03/24 seen in [facility] ED for recurrent abdominal pain and vomiting. Patient was seen in the ED 2 days ago and received IV medicines. Labs unremarkable. UA negative for UTI. Patient given pain medicine and IV steroids.   Previous GI workup: Patient with history of intermittent nausea and vomiting looks like dated back 3+ years. She had EGD March 2022 for evaluation of epigastric pain and nausea which showed gastritis.  Biopsies were negative for H. pylori. History of THC use. Recommended gastric emptying test in the past, unable to tolerate.    Sequently underwent cholecystectomy in March 2022.   Flex sig March 2024 showed poor prep and normal mucosa.  Biopsies were negative for any acute changes.   Chronic abdominal pain.Constipation with likely overflow diarrhea. Several imaging done over the past year.   Interval History  Patient presents to discuss several chronic gastrointestinal issues.   Patient has history of GERD and currently taking  pantoprazole  40 mg twice daily. She reports she has previously tried Nexium and multiple OTC antiacids with no improvement.  She reports despite being on the medication she continues with reflux at least three days a week or more. She reports she wakes up at night regurgitating food and choking. She reports epigastric pain that radiates to her chest. She reports frequent water brash. She reports history of h pylori 10 years ago in [place] .  She doesn't recall being checked for  eradication. She reports she eats early dinner at 6-6:30p and lays down at 10:30-11pm. She props her head on multiple pillows. Denies dysphagia. She reports her appetite fluctuates up and down. Weight stable.   Patient has also been having issues with intermittent nausea and vomiting.  She reports waking up feeling nausea and often times vomits.  She reports she takes Zofran  by mouth or phenergan  suppository if she cannot tolerate anything by mouth. She reports she takes one of them most days.  She denies NSAID use. She uses OTC tylenol  only. She also takes tramadol  50mg  twice daily.    Patient currently on  antibiotics for sinus infection.   Patient has history of chronic constipation and currently taking Linzess  145 mcg po daily. She reports she has BM most days, amount fluctuates. If she does not have BM she is bloated and has abdominal pain. No blood in stool.  Occasional alcohol use, once a week. Daily smoker, currently trying to quit. She is down to 2 cigarettes per day.   Surgical history: gallbladder, left cyst removal, c section x 3, appendectomy, hydrobladder extension.   Patient's family history includes: no colon CA, no esophageal CA  Sees Dr Faythe endocrinology  Sees Dr. Feliciano urology  Wt Readings from Last 3 Encounters:  01/11/24 245 lb (111.1 kg)  01/01/24 240 lb (108.9 kg)  12/16/23 245 lb 12.8 oz (111.5 kg)   Past Medical History:  Diagnosis Date   Acute acalculous cholecystitis s/p lap cholecystectomy 05/11/2020 05/11/2020   Angio-edema 01/15/2021   Anxiety    Asthma    Chronic interstitial cystitis  Depression    Fibromyalgia    HELLP (hemolytic anemia/elev liver enzymes/low platelets in pregnancy) 12/08/2017   Hemorrhagic cyst of left ovary 04/15/2022   History of attempted suicide    as a teenager   History of borderline personality disorder    History of HELLP syndrome, currently pregnant 04/21/2019   Formatting of this note might be different from  the original.  History of HELLP syndrome at 26 weeks 5 days (12/07/2017); admitted to [facility] with BP 200/120s, Plt 52, AST/ALT 471/375  APS evaluation negative (04/2019)   Daily ASA 81 mg   Challenging blood pressure control in current pregnancy      Plan:  - Baseline pre-eclampsia labs this pregnancy within normal limits (Cr: 0.71; urine p:c 0.106)  -    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 03/05/2022   Hypothyroidism due to Hashimoto's thyroiditis    IC (interstitial cystitis)    Insomnia    Kidney stones    Migraines    Miscarriage 09/2020   Necrotizing fasciitis (HCC)    left hand amputation   PTSD (post-traumatic stress disorder)    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   Secondary adrenal insufficiency    Severe uncontrolled hypertension 10/08/2017   UTI (urinary tract infection)     Past Surgical History:  Procedure Laterality Date   APPENDECTOMY     BIOPSY  05/10/2020   Procedure: BIOPSY;  Surgeon: Burnette Fallow, MD;  Location: [facility] ENDOSCOPY;  Service: Endoscopy;;   BIOPSY  05/02/2022   Procedure: BIOPSY;  Surgeon: Leigh Elspeth SQUIBB, MD;  Location: [facility] ENDOSCOPY;  Service: Gastroenterology;;   CESAREAN SECTION N/A 12/06/2017   Procedure: CESAREAN SECTION;  Surgeon: Nicholaus Burnard HERO, MD;  Location: [facility] BIRTHING SUITES;  Service: Obstetrics;  Laterality: N/A;   CESAREAN SECTION  07/21/2021   Procedure: CESAREAN SECTION;  Surgeon: Fredirick Glenys RAMAN, MD;  Location: [facility] LD ORS;  Service: Obstetrics;;   CHOLECYSTECTOMY N/A 05/11/2020   Procedure: SINGLE SITE LAPAROSCOPIC CHOLECYSTECTOMY AND LIVER BIOSY;  Surgeon: Sheldon Elspeth, MD;  Location: [facility] ORS;  Service: General;  Laterality: N/A;   ESOPHAGOGASTRODUODENOSCOPY (EGD) WITH PROPOFOL  N/A 05/10/2020   Procedure: ESOPHAGOGASTRODUODENOSCOPY (EGD) WITH PROPOFOL ;  Surgeon: Burnette Fallow, MD;  Location: [facility] ENDOSCOPY;  Service: Endoscopy;  Laterality: N/A;   FLEXIBLE SIGMOIDOSCOPY N/A 05/02/2022   Procedure: FLEXIBLE SIGMOIDOSCOPY;   Surgeon: Leigh Elspeth SQUIBB, MD;  Location: [facility] ENDOSCOPY;  Service: Gastroenterology;  Laterality: N/A;   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   HAND RECONSTRUCTION Right    INCISION AND DRAINAGE     LAPAROSCOPIC LYSIS OF ADHESIONS  04/22/2022   Procedure: LAPAROSCOPIC LYSIS OF ADHESIONS;  Surgeon: Izell Harari, MD;  Location: [facility] OR;  Service: Gynecology;;   LAPAROSCOPIC OVARIAN CYSTECTOMY Left 04/22/2022   Procedure: LAPAROSCOPIC LEFT OVARIAN CYSTECTOMY;  Surgeon: Izell Harari, MD;  Location: [facility] OR;  Service: Gynecology;  Laterality: Left;   NASAL SEPTUM SURGERY      Current Outpatient Medications  Medication Sig Dispense Refill   acetaminophen  (TYLENOL ) 500 MG tablet Take 1,000 mg by mouth 3 (three) times daily as needed (for pain or headaches).     albuterol  (VENTOLIN  HFA) 108 (90 Base) MCG/ACT inhaler Inhale 2 puffs into the lungs every 4 (four) hours as needed for wheezing or shortness of breath. 6.7 g 0   doxycycline  (VIBRAMYCIN ) 100 MG capsule Take 1 capsule (100 mg total) by mouth 2 (two) times daily for 10 days. 20 capsule 0   EPINEPHrine  0.3 mg/0.3 mL IJ  SOAJ injection Inject 0.3 mg into the muscle as needed for anaphylaxis. 1 each 2   famotidine  (PEPCID ) 20 MG tablet Take 1 tablet (20 mg total) by mouth 2 (two) times daily. 30 tablet 2   FLUoxetine  (PROZAC ) 20 MG capsule Take 3 capsules (60 mg total) by mouth daily. 60 capsule 0   guanFACINE (INTUNIV) 2 MG TB24 ER tablet Take 2 mg by mouth at bedtime.     hydrocortisone  (CORTEF ) 10 MG tablet Take 5-10 mg by mouth See admin instructions. Take 10 mg by mouth in the morning and take 5 mg by mouth at bedtime.     hydrOXYzine  (ATARAX ) 25 MG tablet Take 50 mg by mouth at bedtime.     irbesartan -hydrochlorothiazide  (AVALIDE) 150-12.5 MG tablet Take 1 tablet by mouth in the morning and at bedtime. 60 tablet 5   levothyroxine  (SYNTHROID ) 150 MCG tablet Take 1 tablet (150 mcg total) by mouth daily at 6 (six) AM. 30  tablet 11   linaclotide  (LINZESS ) 145 MCG CAPS capsule Take 1 capsule (145 mcg total) by mouth daily before breakfast. 30 capsule 4   NON FORMULARY Take 1 capsule by mouth See admin instructions. Beef organ capsules- Take 1 capsule by mouth once a day     OLANZapine  (ZYPREXA ) 2.5 MG tablet Take 2.5 mg by mouth daily.     OLANZapine  (ZYPREXA ) 7.5 MG tablet Take 7.5 mg by mouth at bedtime.     ondansetron  (ZOFRAN -ODT) 4 MG disintegrating tablet Take 1 tablet (4 mg total) by mouth every 8 (eight) hours as needed for nausea or vomiting. 20 tablet 0   pantoprazole  (PROTONIX ) 40 MG tablet Take 1 tablet (40 mg total) by mouth 2 (two) times daily as needed (for heartburn). (Patient taking differently: Take 40 mg by mouth 2 (two) times daily.) 60 tablet 3   pregabalin  (LYRICA ) 25 MG capsule Take 1 capsule (25 mg total) by mouth 2 (two) times daily. (Patient taking differently: Take 25 mg by mouth daily.) 60 capsule 2   promethazine  (PHENERGAN ) 25 MG suppository Place 1 suppository (25 mg total) rectally every 6 (six) hours as needed for nausea or vomiting. 12 each 0   RASPBERRY PO Take 2 capsules by mouth See admin instructions. Raspberry Leaf capsules- Take 2 capsules by mouth once a day     Syringe, Disposable, 20 ML MISC Use as directed for daily bladder instillations 30 each PRN   traMADol  (ULTRAM ) 50 MG tablet Take 1 tablet (50 mg total) by mouth every 12 (twelve) hours as needed for moderate pain (pain score 4-6). (Patient taking differently: Take 50 mg by mouth every 6 (six) hours as needed for moderate pain (pain score 4-6) or severe pain (pain score 7-10).) 30 tablet 0   bupivacaine ,PF, (MARCAINE ) 0.5 % SOLN injection Instill 15 mLs into bladder daily with heparin . (Patient not taking: Reported on 01/11/2024) 450 mL PRN   heparin  10000 UNIT/ML injection Instill 4 mLs (40,000 Units total) into the bladder daily with bupivacaine . (Patient not taking: Reported on 01/11/2024) 120 mL PRN    oxyCODONE -acetaminophen  (PERCOCET) 5-325 MG tablet Take 1 tablet by mouth every 6 (six) hours as needed. (Patient not taking: Reported on 01/11/2024) 10 tablet 0   No current facility-administered medications for this visit.    Allergies as of 01/11/2024 - Review Complete 01/11/2024  Allergen Reaction Noted   Bee venom Anaphylaxis, Swelling, and Other (See Comments) 04/25/2022   Coreg  [carvedilol ] Shortness Of Breath and Other (See Comments) 03/06/2022   Red dye #  40 (allura red) Other (See Comments) 11/06/2015   Compazine  [prochlorperazine  maleate] Other (See Comments) 11/06/2015   Vicodin [hydrocodone -acetaminophen ] Itching, Nausea And Vomiting, and Other (See Comments) 11/06/2015   Cymbalta [duloxetine hcl] Other (See Comments) 05/08/2020   Ditropan  [oxybutynin ] Other (See Comments) 04/25/2022   Elavil [amitriptyline] Other (See Comments) 04/25/2022   Motrin  [ibuprofen ] Other (See Comments) 04/18/2022   Naprosyn  [naproxen ] Other (See Comments) 03/06/2022   Pyrimethamine Other (See Comments) 04/25/2022   Sulfadoxine Other (See Comments) 04/25/2022   Zestril  [lisinopril ] Cough 08/10/2021   Nifedical xl [nifedipine ] Nausea And Vomiting 09/07/2018   Reglan  [metoclopramide ] Swelling and Other (See Comments) 07/24/2017   Trandate  [labetalol ] Nausea And Vomiting and Other (See Comments) 06/22/2019    Family History  Problem Relation Age of Onset   Hypertension Mother    Coronary artery disease Mother    Migraines Mother    Hypertension Father    Stroke Father    Atrial fibrillation Father    Thyroid  disease Sister    Clotting disorder Sister    Asthma Sister    Migraines Sister    Thyroid  disease Sister    Asthma Sister    Migraines Sister    Asthma Brother        oldest   Migraines Brother    Hypertension Brother    Gout Brother    Asthma Brother    Migraines Brother    Asthma Brother    Migraines Brother    Heart failure Maternal Grandfather    Heart attack Maternal  Grandfather     Review of Systems:    Constitutional: No weight loss, fever, chills, weakness or fatigue HEENT: Eyes: No change in vision               Ears, Nose, Throat:  No change in hearing or congestion Skin: No rash or itching Cardiovascular: No chest pain, chest pressure or palpitations   Respiratory: No SOB or cough Gastrointestinal: See HPI and otherwise negative Genitourinary: No dysuria or change in urinary frequency Neurological: No headache, dizziness or syncope Musculoskeletal: No new muscle or joint pain Hematologic: No bleeding or bruising Psychiatric: No history of depression or anxiety    Physical Exam:  Vital signs: BP 126/82   Pulse 64   Ht 5' 11 (1.803 m)   Wt 245 lb (111.1 kg)   LMP 01/03/2024 (Approximate)   SpO2 99%   BMI 34.17 kg/m   Constitutional:   Pleasant female appears to be in NAD, Well developed, Well nourished, alert and cooperative Throat: Oral cavity and pharynx without inflammation, swelling or lesion.  Respiratory: Respirations even and unlabored. Lungs clear to auscultation bilaterally.   No wheezes, crackles, or rhonchi.  Cardiovascular: Normal S1, S2. Regular rate and rhythm. No peripheral edema, cyanosis or pallor.  Gastrointestinal:  Soft, nondistended, epigastric abd tenderness with palpation. No rebound or guarding. Hypoactive bowel sounds. No appreciable masses or hepatomegaly. Rectal:  Not performed.  Msk:  Symmetrical without gross deformities. Without edema, no deformity or joint abnormality.  Neurologic:  Alert and  oriented x4;  grossly normal neurologically.  Skin:   Dry and intact without significant lesions or rashes.  RELEVANT LABS AND IMAGING: CBC    Latest Ref Rng & Units 01/03/2024    8:19 PM 01/01/2024   10:20 PM 12/16/2023   10:40 AM  CBC  WBC 4.0 - 10.5 K/uL 11.0  9.1  6.6   Hemoglobin 12.0 - 15.0 g/dL 85.8  85.7  83.7   Hematocrit 36.0 -  46.0 % 43.1  42.5  48.3   Platelets 150 - 400 K/uL 294  327  345       CMP     Latest Ref Rng & Units 01/03/2024    8:19 PM 01/01/2024   10:20 PM 12/16/2023   10:40 AM  CMP  Glucose 70 - 99 mg/dL 96  885  897   BUN 6 - 20 mg/dL 20  15  23    Creatinine 0.44 - 1.00 mg/dL 8.92  8.95  8.96   Sodium 135 - 145 mmol/L 137  137  136   Potassium 3.5 - 5.1 mmol/L 3.2  3.7  4.6   Chloride 98 - 111 mmol/L 97  100  98   CO2 22 - 32 mmol/L 29  24  23    Calcium  8.9 - 10.3 mg/dL 9.6  9.7  89.5   Total Protein 6.5 - 8.1 g/dL 7.7  7.4    Total Bilirubin 0.0 - 1.2 mg/dL 0.5  0.6    Alkaline Phos 38 - 126 U/L 71  69    AST 15 - 41 U/L 22  28    ALT 0 - 44 U/L 24  20       Lab Results  Component Value Date   TSH 3.990 03/25/2023   Lab Results  Component Value Date   LIPASE 24 01/03/2024     05/2021 echo- Left ventricular ejection fraction, by estimation, is 60 to 65%   GI procedures: 05/02/22 flex sigmoidoscopy with Dr. Leigh at [facility] - Preparation of the colon was fair at best. - Internal hemorrhoids. - The examination was otherwise normal. - Biopsies were taken with a cold forceps for evaluation of microscopic colitis. Path: FINAL MICROSCOPIC DIAGNOSIS:   A. SIGMOID COLON, BIOPSY:  Benign colonic mucosa with no diagnostic abnormality   --No cause for her pain on this exam. See last inpatient progress note for full thoughts from our perspective. I do not think her pain is related to her GI tract, more so likely her bladder and perhaps neuropathic / visceral hypersensitivity. Would try to minimize narcotic use and treat with alternatives if possible. Difficult situation. Awaiting porphyria workup, continue to treat adrenal insufficiency.  05/10/20 EGD with Dr. Burnette  - Normal esophagus. - Gastritis. Biopsied. - Normal duodenal bulb, first portion of the duodenum and second portion of the duodenum. - No explanation for patient' s abdominal pain seen on today' s endoscopy. Path: A. STOMACH, BIOPSY:  - Mild reactive gastropathy.  - Warthin-Starry is  negative for Helicobacter pylori.  - No intestinal metaplasia, dysplasia, or malignancy.    Imaging: 09/07/23 CT renal stone IMPRESSION: 1. No acute abdominopelvic findings. 2. Mild centrilobular emphysema.  Emphysema (ICD10-J43.9).  07/06/23 CTAP IMPRESSION: No acute findings in the abdomen or pelvis. Specifically, no findings to explain the patient's history of abdominal pain.  06/30/23 CTAP IMPRESSION: 1. No acute inflammatory process identified within the abdomen or pelvis. 2. Multiple other nonacute observations, as described above.  Assessment: Encounter Diagnoses  Name Primary?   Gastroesophageal reflux disease, unspecified whether esophagitis present Yes   Nausea and vomiting, unspecified vomiting type    Abdominal pain, chronic, epigastric    Irritable bowel syndrome with constipation    History of Helicobacter pylori infection       33 year old female patient who presents with several gastrointestinal complaints. Her first complaint is of uncontrolled GERD despite being on twice daily PPI therapy and following strict GERD diet with no late meals.  Will add Pepcid  at bedtime.  Will also defer procedures to Dr. Leigh for 24-hour impedance study and/or esophageal manometry with EGD Bravo to evaluate level of acidification.     Patient also presents with epigastric pain that Makiah Foye or Romy Mcgue not be related to reflux.  Will go ahead and check H. pylori Diatherix stool test. CT renal 08/2023 showed no intra or extrahepatic biliary ductal dilatation.  Post Cholecystectomy. Normal pancreas. Normal LFTs.Normal lipase. No NSAID use.     She also has chronic nausea and vomiting. She had EGD March 2022 for evaluation of epigastric pain and nausea which showed gastritis.  Biopsies were negative for H. pylori. History of THC use. Denied today. Recommended gastric emptying test in the past, unable to tolerate.  Will recheck her potassium level as it was low 3.2. Lastly, patient has chronic  constipation , I suspect is both functional and drug induced. Patient taking daily tramadol  and previously also on oxycodone , reports she ran out. She continues with inetrmittent issues with constipation, bloating and abdominal pain meeting rome crietria IV for IBS. Will increase her linzess  dose to 290 mcg po daily to see if this provides better results. Flex sig March 2024 showed poor prep and normal mucosa.  Biopsies were negative for any acute changes.   Plan: - Repeat CBC, BMET today -Minimize narcotics.  -Increase Linzess  from 145 mcg to 290 mcg po daily -Continue GERD diet, no late meals -Raise HOB at night -Continue pantoprazole  40 mg twice daily -Add Pepcid  at bedtime -h pylori diathereix stool test  - defer procedures to Dr. Leigh >> 24 hour impedence study/eso manometry vs EGD Bravo  Thank you for the courtesy of this consult. Please call me with any questions or concerns.   Maryellen Dowdle, FNP-C [facility] 01/11/2024, 10:56 AM  Cc: Rothfuss, Lang DASEN, PA-C

## 2024-01-11 NOTE — Progress Notes (Signed)
 Agree with assessment and plan as outlined.  Her symptoms seem to be persistent and refractory.  Her last endoscopy was over 3 years ago.  I do recommend an EGD to further evaluate.  Pending that result, if symptoms persist despite management and her endoscopy looks okay, then I would recommend a 24-hour pH impedance test at the hospital.  POD A RN -can you help coordinate EGD at the [facility] if the patient is willing?  Thanks

## 2024-01-12 ENCOUNTER — Telehealth: Payer: Self-pay | Admitting: *Deleted

## 2024-01-12 DIAGNOSIS — K219 Gastro-esophageal reflux disease without esophagitis: Secondary | ICD-10-CM

## 2024-01-12 DIAGNOSIS — R112 Nausea with vomiting, unspecified: Secondary | ICD-10-CM

## 2024-01-12 DIAGNOSIS — G8929 Other chronic pain: Secondary | ICD-10-CM

## 2024-01-12 NOTE — Telephone Encounter (Signed)
 I have spoken to patient who is agreeable to moving forward with endoscopy. She has scheduled 02/29/24 procedure. Patient has been advised of time/date/location for upcoming procedure and has been given generalized verbal prep instructions. Discussed that a care partner 18 years or older should bring her, stay for the procedure and drive home due to sedation. Written instructions have been made available to the patient for additional review.

## 2024-01-12 NOTE — Telephone Encounter (Signed)
 See 01/11/24 office note addendum:  Author: Leigh Elspeth SQUIBB, MD Service: Gastroenterology Author Type: Physician  Filed: 01/11/2024  5:48 PM Encounter Date: 01/11/2024 Status: Signed  Editor: Armbruster, Elspeth SQUIBB, MD (Physician)   Agree with assessment and plan as outlined.   Her symptoms seem to be persistent and refractory.  Her last endoscopy was over 3 years ago.  I do recommend an EGD to further evaluate.  Pending that result, if symptoms persist despite management and her endoscopy looks okay, then I would recommend a 24-hour pH impedance test at the hospital.   POD A RN -can you help coordinate EGD at the [facility] if the patient is willing?  Thanks

## 2024-01-12 NOTE — Progress Notes (Signed)
 See 01/12/24 telephone note for further information.

## 2024-02-02 NOTE — Telephone Encounter (Signed)
-----   Message from Cathryne PARAS May sent at 01/25/2024 10:45 AM EST ----- Karna- Please let pt know following, she has not read message   Deanna, NP ----- Message ----- From: Interface, [facility] In [facility] One Sent: 01/11/2024   1:11 PM EST To: Cathryne PARAS May, NP

## 2024-02-11 ENCOUNTER — Ambulatory Visit (HOSPITAL_BASED_OUTPATIENT_CLINIC_OR_DEPARTMENT_OTHER): Payer: MEDICAID | Admitting: Student

## 2024-02-19 ENCOUNTER — Encounter (HOSPITAL_COMMUNITY): Payer: Self-pay | Admitting: *Deleted

## 2024-02-19 ENCOUNTER — Other Ambulatory Visit: Payer: Self-pay

## 2024-02-19 ENCOUNTER — Emergency Department (HOSPITAL_COMMUNITY)
Admission: EM | Admit: 2024-02-19 | Discharge: 2024-02-19 | Disposition: A | Discharge status: Home / Self Care | Payer: MEDICAID | Attending: Emergency Medicine | Admitting: Emergency Medicine

## 2024-02-19 DIAGNOSIS — N3011 Interstitial cystitis (chronic) with hematuria: Secondary | ICD-10-CM | POA: Diagnosis not present

## 2024-02-19 DIAGNOSIS — E039 Hypothyroidism, unspecified: Secondary | ICD-10-CM | POA: Diagnosis not present

## 2024-02-19 DIAGNOSIS — I10 Essential (primary) hypertension: Secondary | ICD-10-CM | POA: Diagnosis not present

## 2024-02-19 DIAGNOSIS — R300 Dysuria: Secondary | ICD-10-CM | POA: Diagnosis present

## 2024-02-19 LAB — URINALYSIS, ROUTINE W REFLEX MICROSCOPIC
Bilirubin Urine: NEGATIVE
Glucose, UA: NEGATIVE mg/dL
Ketones, ur: NEGATIVE mg/dL
Nitrite: NEGATIVE
Protein, ur: NEGATIVE mg/dL
Specific Gravity, Urine: 1.016 (ref 1.005–1.030)
pH: 6 (ref 5.0–8.0)

## 2024-02-19 LAB — CBC WITH DIFFERENTIAL/PLATELET
Abs Immature Granulocytes: 0.04 K/uL (ref 0.00–0.07)
Basophils Absolute: 0.1 K/uL (ref 0.0–0.1)
Basophils Relative: 1 %
Eosinophils Absolute: 0 K/uL (ref 0.0–0.5)
Eosinophils Relative: 0 %
HCT: 42.8 % (ref 36.0–46.0)
Hemoglobin: 14.5 g/dL (ref 12.0–15.0)
Immature Granulocytes: 1 %
Lymphocytes Relative: 33 %
Lymphs Abs: 2.7 K/uL (ref 0.7–4.0)
MCH: 32.4 pg (ref 26.0–34.0)
MCHC: 33.9 g/dL (ref 30.0–36.0)
MCV: 95.7 fL (ref 80.0–100.0)
Monocytes Absolute: 1 K/uL (ref 0.1–1.0)
Monocytes Relative: 12 %
Neutro Abs: 4.4 K/uL (ref 1.7–7.7)
Neutrophils Relative %: 53 %
Platelets: 312 K/uL (ref 150–400)
RBC: 4.47 MIL/uL (ref 3.87–5.11)
RDW: 14.1 % (ref 11.5–15.5)
WBC: 8.1 K/uL (ref 4.0–10.5)
nRBC: 0 % (ref 0.0–0.2)

## 2024-02-19 LAB — COMPREHENSIVE METABOLIC PANEL WITH GFR
ALT: 21 U/L (ref 0–44)
AST: 39 U/L (ref 15–41)
Albumin: 4.2 g/dL (ref 3.5–5.0)
Alkaline Phosphatase: 64 U/L (ref 38–126)
Anion gap: 10 (ref 5–15)
BUN: 19 mg/dL (ref 6–20)
CO2: 25 mmol/L (ref 22–32)
Calcium: 9.4 mg/dL (ref 8.9–10.3)
Chloride: 101 mmol/L (ref 98–111)
Creatinine, Ser: 0.7 mg/dL (ref 0.44–1.00)
GFR, Estimated: >60 mL/min
Glucose, Bld: 90 mg/dL (ref 70–99)
Potassium: 4.4 mmol/L (ref 3.5–5.1)
Sodium: 136 mmol/L (ref 135–145)
Total Bilirubin: 0.3 mg/dL (ref 0.0–1.2)
Total Protein: 7.2 g/dL (ref 6.5–8.1)

## 2024-02-19 LAB — PREGNANCY, URINE: Preg Test, Ur: NEGATIVE

## 2024-02-19 MED ORDER — IRBESARTAN 150 MG PO TABS
150.0000 mg | ORAL_TABLET | Freq: Once | ORAL | Status: AC
  Administered 2024-02-19: 150 mg via ORAL
  Filled 2024-02-19: qty 1

## 2024-02-19 MED ORDER — ONDANSETRON HCL 4 MG/2ML IJ SOLN
4.0000 mg | Freq: Once | INTRAMUSCULAR | Status: AC
  Administered 2024-02-19: 4 mg via INTRAVENOUS
  Filled 2024-02-19: qty 2

## 2024-02-19 MED ORDER — HYDROMORPHONE HCL 1 MG/ML IJ SOLN
1.0000 mg | Freq: Once | INTRAMUSCULAR | Status: AC
  Administered 2024-02-19: 1 mg via INTRAVENOUS
  Filled 2024-02-19: qty 1

## 2024-02-19 MED ORDER — LACTATED RINGERS IV BOLUS
1000.0000 mL | Freq: Once | INTRAVENOUS | Status: AC
  Administered 2024-02-19: 1000 mL via INTRAVENOUS

## 2024-02-19 MED ORDER — HYDROCODONE-ACETAMINOPHEN 7.5-325 MG PO TABS
1.0000 | ORAL_TABLET | Freq: Once | ORAL | Status: AC
  Administered 2024-02-19: 1 via ORAL
  Filled 2024-02-19: qty 1

## 2024-02-19 MED ORDER — HYDROCHLOROTHIAZIDE 12.5 MG PO TABS
12.5000 mg | ORAL_TABLET | Freq: Once | ORAL | Status: AC
  Administered 2024-02-19: 12.5 mg via ORAL
  Filled 2024-02-19: qty 1

## 2024-02-19 MED ORDER — IRBESARTAN-HYDROCHLOROTHIAZIDE 150-12.5 MG PO TABS: 1.0000 | ORAL_TABLET | Freq: Once | ORAL | Status: DC

## 2024-02-19 MED ORDER — PREGABALIN 25 MG PO CAPS
25.0000 mg | ORAL_CAPSULE | Freq: Once | ORAL | Status: AC
  Administered 2024-02-19: 25 mg via ORAL
  Filled 2024-02-19: qty 1

## 2024-02-19 NOTE — ED Triage Notes (Signed)
 Pt states she has bad bladder pain, pain with urination and frequency.

## 2024-02-19 NOTE — ED Provider Notes (Signed)
 " [facility] [place] EMERGENCY DEPARTMENT AT [facility] Provider Note   CSN: [id number] Arrival date & time: 02/19/24  1334     Patient presents with: Bladder Pain   Krystal [name] is a 33 y.o. female.   Patient is a 33 year old female with a history of interstitial cystitis, hypertension, hypothyroidism who is presenting today with a 36-hour history of suprapubic pain, dysuria, pain and discomfort in her vaginal and perineal region that started yesterday.  She reports everything is worse when she tries to urinate.  She gets this a lot and usually can control her symptoms with her Lyrica , hydrocodone  and Avalide at home but she has started vomiting and was unable to keep anything down.  She reports is not uncommon for her IC to get out of control and cause emesis and when that occurs she usually has to come to the hospital to get IV.  She has not had any back pain, fever.  There is been no vaginal discharge or rashes.  No bowel changes.  The history is provided by the patient and medical records.       Prior to Admission medications  Medication Sig Start Date End Date Taking? Authorizing Provider  acetaminophen  (TYLENOL ) 500 MG tablet Take 1,000 mg by mouth 3 (three) times daily as needed (for pain or headaches).    [provider]  albuterol  (VENTOLIN  HFA) 108 (90 Base) MCG/ACT inhaler Inhale 2 puffs into the lungs every 4 (four) hours as needed for wheezing or shortness of breath. 01/05/24   Ival Domino, FNP  bupivacaine ,PF, (MARCAINE ) 0.5 % SOLN injection Instill 15 mLs into bladder daily with heparin . Patient not taking: Reported on 01/11/2024 04/06/23     EPINEPHrine  0.3 mg/0.3 mL IJ SOAJ injection Inject 0.3 mg into the muscle as needed for anaphylaxis. 01/15/21   Luke Orlan HERO, DO  famotidine  (PEPCID ) 20 MG tablet Take 1 tablet (20 mg total) by mouth 2 (two) times daily. 01/11/24   May, Deanna J, NP  FLUoxetine  (PROZAC ) 20 MG capsule Take 3 capsules (60 mg total)  by mouth daily. 07/22/23 11/25/26  Rothfuss, Jacob T, PA-C  guanFACINE (INTUNIV) 2 MG TB24 ER tablet Take 2 mg by mouth at bedtime.    [provider]  heparin  10000 UNIT/ML injection Instill 4 mLs (40,000 Units total) into the bladder daily with bupivacaine . Patient not taking: Reported on 01/11/2024 04/06/23     hydrocortisone  (CORTEF ) 10 MG tablet Take 5-10 mg by mouth See admin instructions. Take 10 mg by mouth in the morning and take 5 mg by mouth at bedtime.    [provider]  hydrOXYzine  (ATARAX ) 25 MG tablet Take 50 mg by mouth at bedtime.    [provider]  irbesartan -hydrochlorothiazide  (AVALIDE) 150-12.5 MG tablet Take 1 tablet by mouth in the morning and at bedtime. 09/03/23 01/11/28  Rothfuss, Jacob T, PA-C  levothyroxine  (SYNTHROID ) 150 MCG tablet Take 1 tablet (150 mcg total) by mouth daily at 6 (six) AM. 10/06/22 01/11/27  Elnora Ip, MD  linaclotide  (LINZESS ) 145 MCG CAPS capsule Take 1 capsule (145 mcg total) by mouth daily before breakfast. 09/03/23   Rothfuss, Jacob T, PA-C  NON FORMULARY Take 1 capsule by mouth See admin instructions. Beef organ capsules- Take 1 capsule by mouth once a day    [provider]  OLANZapine  (ZYPREXA ) 2.5 MG tablet Take 2.5 mg by mouth daily.    [provider]  OLANZapine  (ZYPREXA ) 7.5 MG tablet Take 7.5 mg by mouth  at bedtime.    [provider]  ondansetron  (ZOFRAN -ODT) 4 MG disintegrating tablet Take 1 tablet (4 mg total) by mouth every 8 (eight) hours as needed for nausea or vomiting. 12/03/23   Franklyn Sid SAILOR, MD  oxyCODONE -acetaminophen  (PERCOCET) 5-325 MG tablet Take 1 tablet by mouth every 6 (six) hours as needed. Patient not taking: Reported on 01/11/2024 01/03/24   Patt Alm Macho, MD  pantoprazole  (PROTONIX ) 40 MG tablet Take 1 tablet (40 mg total) by mouth 2 (two) times daily as needed (for heartburn). Patient taking differently: Take 40 mg by mouth 2 (two) times daily.  10/01/23   Rothfuss, Jacob T, PA-C  pregabalin  (LYRICA ) 25 MG capsule Take 1 capsule (25 mg total) by mouth 2 (two) times daily. Patient taking differently: Take 25 mg by mouth daily. 11/03/23   Rothfuss, Jacob T, PA-C  promethazine  (PHENERGAN ) 25 MG suppository Place 1 suppository (25 mg total) rectally every 6 (six) hours as needed for nausea or vomiting. 01/03/24   Patt Alm Macho, MD  RASPBERRY PO Take 2 capsules by mouth See admin instructions. Raspberry Leaf capsules- Take 2 capsules by mouth once a day    [provider]  Syringe, Disposable, 20 ML MISC Use as directed for daily bladder instillations 04/06/23     traMADol  (ULTRAM ) 50 MG tablet Take 1 tablet (50 mg total) by mouth every 12 (twelve) hours as needed for moderate pain (pain score 4-6). Patient taking differently: Take 50 mg by mouth every 6 (six) hours as needed for moderate pain (pain score 4-6) or severe pain (pain score 7-10). 04/27/23   Ezenduka, Nkeiruka J, MD    Allergies: Bee venom, Coreg  [carvedilol ], Red dye #40 (allura red), Compazine  [prochlorperazine  maleate], Vicodin [hydrocodone -acetaminophen ], Cymbalta [duloxetine hcl], Ditropan  [oxybutynin ], Elavil [amitriptyline], Motrin  [ibuprofen ], Naprosyn  [naproxen ], Pyrimethamine, Sulfadoxine, Zestril  [lisinopril ], Nifedical xl [nifedipine ], Prochlorperazine , Reglan  [metoclopramide ], and Trandate  [labetalol ]    Review of Systems  Updated Vital Signs BP (!) 196/116   Pulse 72   Temp 98.3 F (36.8 C)   Resp 16   Ht 5' 11 (1.803 m)   Wt 111.1 kg   SpO2 93%   BMI 34.17 kg/m   Physical Exam Vitals and nursing note reviewed.  Constitutional:      General: She is in acute distress.     Appearance: She is well-developed.  HENT:     Head: Normocephalic and atraumatic.  Eyes:     Pupils: Pupils are equal, round, and reactive to light.  Cardiovascular:     Rate and Rhythm: Normal rate and regular rhythm.     Heart sounds: Normal heart sounds. No murmur heard.     No friction rub.  Pulmonary:     Effort: Pulmonary effort is normal.     Breath sounds: Normal breath sounds. No wheezing or rales.  Abdominal:     General: Bowel sounds are normal. There is no distension.     Palpations: Abdomen is soft.     Tenderness: There is abdominal tenderness in the suprapubic area. There is no right CVA tenderness, left CVA tenderness, guarding or rebound.  Musculoskeletal:        General: No tenderness. Normal range of motion.     Comments: No edema.  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  Skin:    General: Skin is warm and dry.     Findings: No rash.  Neurological:     Mental  Status: She is alert and oriented to person, place, and time.     Cranial Nerves: No cranial nerve deficit.  Psychiatric:        Behavior: Behavior normal.     (all labs ordered are listed, but only abnormal results are displayed) Labs Reviewed  URINALYSIS, ROUTINE W REFLEX MICROSCOPIC - Abnormal; Notable for the following components:      Result Value   APPearance HAZY (*)    Hgb urine dipstick MODERATE (*)    Leukocytes,Ua TRACE (*)    Bacteria, UA FEW (*)    All other components within normal limits  PREGNANCY, URINE  CBC WITH DIFFERENTIAL/PLATELET  COMPREHENSIVE METABOLIC PANEL WITH GFR    EKG: None  Radiology: No results found.   Procedures   Medications Ordered in the ED  lactated ringers  bolus 1,000 mL (0 mLs Intravenous Stopped 02/19/24 1929)  HYDROmorphone  (DILAUDID ) injection 1 mg (1 mg Intravenous Given 02/19/24 1705)  ondansetron  (ZOFRAN ) injection 4 mg (4 mg Intravenous Given 02/19/24 1705)  HYDROmorphone  (DILAUDID ) injection 1 mg (1 mg Intravenous Given 02/19/24 1803)  pregabalin  (LYRICA ) capsule 25 mg (25 mg Oral Given 02/19/24 1805)  irbesartan  (AVAPRO ) tablet 150 mg (150 mg Oral Given 02/19/24 1815)    And  hydrochlorothiazide  (HYDRODIURIL ) tablet 12.5 mg (12.5  mg Oral Given 02/19/24 1805)  HYDROcodone -acetaminophen  (NORCO) 7.5-325 MG per tablet 1 tablet (1 tablet Oral Given 02/19/24 1958)                                    Medical Decision Making Amount and/or Complexity of Data Reviewed Labs: ordered. Decision-making details documented in ED Course.  Risk Prescription drug management.   Pt with multiple medical problems and comorbidities and presenting today with a complaint that caries a high risk for morbidity and mortality.  Here today with the above complaints.  Suspect most likely IC flare.  Lower suspicion for kidney stone, pyelonephritis, pregnancy or STI.  Patient has some suprapubic tenderness but no significant abdominal pain concerning for peritoneal findings.  Vital signs do show hypertension which patient has a history of and has not been able to tolerate oral medication today.  Will treat pain and nausea.  I independently interpreted patient's labs and CBC, CMP are within normal limits, UA does show hemoglobin but no white blood cells and only few bacteria which again feel that is most likely related to her IC.      Final diagnoses:  Interstitial cystitis (chronic) with hematuria    ED Discharge Orders     None          Doretha Folks, MD 02/19/24 2054  "

## 2024-02-19 NOTE — ED Notes (Signed)
 Unsuccessful IV attempt x2.

## 2024-02-27 ENCOUNTER — Telehealth: Payer: Self-pay | Admitting: Physician Assistant

## 2024-02-27 NOTE — Telephone Encounter (Signed)
 Got it, sorry to hear it, thanks for letting me know Krystal [name]. She can call back to schedule at her convenience.  [facility] Charge - FYI - regarding cancellation for Monday AM

## 2024-02-27 NOTE — Telephone Encounter (Signed)
 Patient called this afternoon to cancel her upper endoscopy for Monday with Dr. Leigh.  Patient and her entire family are ill with RSV and her daughter is currently hospitalized with RSV   Will need to reach out to her at some point next week ,regarding rescheduling

## 2024-02-29 ENCOUNTER — Encounter: Payer: MEDICAID | Admitting: Gastroenterology

## 2024-02-29 NOTE — Telephone Encounter (Signed)
 Appointment for 02/29/24 has been cancelled at this time.

## 2024-03-07 ENCOUNTER — Other Ambulatory Visit: Payer: Self-pay

## 2024-03-07 ENCOUNTER — Emergency Department (HOSPITAL_COMMUNITY): Payer: MEDICAID

## 2024-03-07 ENCOUNTER — Encounter (HOSPITAL_COMMUNITY): Payer: Self-pay | Admitting: Emergency Medicine

## 2024-03-07 ENCOUNTER — Emergency Department (HOSPITAL_COMMUNITY): Admission: EM | Admit: 2024-03-07 | Discharge: 2024-03-08 | Disposition: A | Payer: MEDICAID

## 2024-03-07 DIAGNOSIS — I10 Essential (primary) hypertension: Secondary | ICD-10-CM | POA: Insufficient documentation

## 2024-03-07 DIAGNOSIS — G8929 Other chronic pain: Secondary | ICD-10-CM | POA: Insufficient documentation

## 2024-03-07 DIAGNOSIS — J45909 Unspecified asthma, uncomplicated: Secondary | ICD-10-CM | POA: Insufficient documentation

## 2024-03-07 DIAGNOSIS — Z79899 Other long term (current) drug therapy: Secondary | ICD-10-CM | POA: Insufficient documentation

## 2024-03-07 DIAGNOSIS — R1084 Generalized abdominal pain: Secondary | ICD-10-CM | POA: Insufficient documentation

## 2024-03-07 DIAGNOSIS — R112 Nausea with vomiting, unspecified: Secondary | ICD-10-CM | POA: Insufficient documentation

## 2024-03-07 DIAGNOSIS — R1030 Lower abdominal pain, unspecified: Secondary | ICD-10-CM | POA: Diagnosis present

## 2024-03-07 LAB — URINALYSIS, ROUTINE W REFLEX MICROSCOPIC
Bilirubin Urine: NEGATIVE
Glucose, UA: NEGATIVE mg/dL
Ketones, ur: NEGATIVE mg/dL
Leukocytes,Ua: NEGATIVE
Nitrite: NEGATIVE
Protein, ur: NEGATIVE mg/dL
Specific Gravity, Urine: 1.011 (ref 1.005–1.030)
pH: 8 (ref 5.0–8.0)

## 2024-03-07 LAB — CBC WITH DIFFERENTIAL/PLATELET
Abs Immature Granulocytes: 0.04 K/uL (ref 0.00–0.07)
Basophils Absolute: 0.1 K/uL (ref 0.0–0.1)
Basophils Relative: 1 %
Eosinophils Absolute: 0 K/uL (ref 0.0–0.5)
Eosinophils Relative: 0 %
HCT: 41 % (ref 36.0–46.0)
Hemoglobin: 14 g/dL (ref 12.0–15.0)
Immature Granulocytes: 0 %
Lymphocytes Relative: 25 %
Lymphs Abs: 2.5 K/uL (ref 0.7–4.0)
MCH: 32.3 pg (ref 26.0–34.0)
MCHC: 34.1 g/dL (ref 30.0–36.0)
MCV: 94.5 fL (ref 80.0–100.0)
Monocytes Absolute: 0.8 K/uL (ref 0.1–1.0)
Monocytes Relative: 7 %
Neutro Abs: 6.8 K/uL (ref 1.7–7.7)
Neutrophils Relative %: 67 %
Platelets: 350 K/uL (ref 150–400)
RBC: 4.34 MIL/uL (ref 3.87–5.11)
RDW: 13.4 % (ref 11.5–15.5)
WBC: 10.2 K/uL (ref 4.0–10.5)
nRBC: 0 % (ref 0.0–0.2)

## 2024-03-07 LAB — COMPREHENSIVE METABOLIC PANEL WITH GFR
ALT: 25 U/L (ref 0–44)
AST: 31 U/L (ref 15–41)
Albumin: 4.4 g/dL (ref 3.5–5.0)
Alkaline Phosphatase: 70 U/L (ref 38–126)
Anion gap: 10 (ref 5–15)
BUN: 19 mg/dL (ref 6–20)
CO2: 27 mmol/L (ref 22–32)
Calcium: 9.4 mg/dL (ref 8.9–10.3)
Chloride: 100 mmol/L (ref 98–111)
Creatinine, Ser: 0.71 mg/dL (ref 0.44–1.00)
GFR, Estimated: >60 mL/min
Glucose, Bld: 100 mg/dL — ABNORMAL HIGH (ref 70–99)
Potassium: 3.9 mmol/L (ref 3.5–5.1)
Sodium: 137 mmol/L (ref 135–145)
Total Bilirubin: 0.3 mg/dL (ref 0.0–1.2)
Total Protein: 7.4 g/dL (ref 6.5–8.1)

## 2024-03-07 LAB — HCG, SERUM, QUALITATIVE: Preg, Serum: NEGATIVE

## 2024-03-07 LAB — LIPASE, BLOOD: Lipase: 31 U/L (ref 11–51)

## 2024-03-07 MED ORDER — MORPHINE SULFATE (PF) 4 MG/ML IV SOLN
4.0000 mg | Freq: Once | INTRAVENOUS | Status: AC
  Administered 2024-03-07: 4 mg via INTRAVENOUS
  Filled 2024-03-07: qty 1

## 2024-03-07 MED ORDER — IOHEXOL 300 MG/ML  SOLN
100.0000 mL | Freq: Once | INTRAMUSCULAR | Status: AC | PRN
  Administered 2024-03-07: 100 mL via INTRAVENOUS

## 2024-03-07 MED ORDER — ONDANSETRON HCL 4 MG/2ML IJ SOLN
4.0000 mg | Freq: Once | INTRAMUSCULAR | Status: AC
  Administered 2024-03-07: 4 mg via INTRAVENOUS
  Filled 2024-03-07: qty 2

## 2024-03-07 MED ORDER — ONDANSETRON 8 MG PO TBDP
8.0000 mg | ORAL_TABLET | Freq: Once | ORAL | Status: AC
  Administered 2024-03-07: 8 mg via ORAL
  Filled 2024-03-07: qty 1

## 2024-03-07 MED ORDER — OXYCODONE HCL 5 MG PO TABS
5.0000 mg | ORAL_TABLET | Freq: Once | ORAL | Status: AC
  Administered 2024-03-07: 5 mg via ORAL
  Filled 2024-03-07: qty 1

## 2024-03-07 NOTE — ED Notes (Signed)
 Patient is sitting by the wheelchairs at front entrance.

## 2024-03-07 NOTE — ED Notes (Signed)
 US  PIV placed, R forearm, 20g 2.5

## 2024-03-07 NOTE — ED Triage Notes (Signed)
 Patient c/o Lower abdominal pain x 2 days. Patient report nausea and vomiting x6 today. Patient repor taking PRN pain medication with no relief. Patient report dysuria. Patient denies fever.

## 2024-03-07 NOTE — ED Provider Triage Note (Signed)
 Emergency Medicine Provider Triage Evaluation Note  Krystal [name] , a 34 y.o. female  was evaluated in triage.  Pt complains of abdominal pain.  Started 2 days ago.  Patient reporting nausea and vomiting x 6 days.  Pain is in the lower abdomen.  Reports dysuria as well.  Also endorses history of interstitial cystitis.  Review of Systems  Positive: See above Negative: See above  Physical Exam  BP (!) 185/135 (BP Location: Right Arm)   Pulse 85   Temp 98.3 F (36.8 C) (Oral)   Resp 16   LMP 02/16/2024   SpO2 96%  Gen:   Awake, no distress   Resp:  Normal effort  MSK:   Moves extremities without difficulty  Other:    Medical Decision Making  Medically screening exam initiated at 5:13 PM.  Appropriate orders placed.  Krystal [name] was informed that the remainder of the evaluation will be completed by another provider, this initial triage assessment does not replace that evaluation, and the importance of remaining in the ED until their evaluation is complete.  Work up started   Lang Norleen POUR, PA-C 03/07/24 1714

## 2024-03-07 NOTE — ED Notes (Signed)
 No answer when called for vitals. Patient was seen leaving but not sure if it was for leaving.

## 2024-03-07 NOTE — ED Notes (Addendum)
 Upon giving PO meds, pt vomited. IV meds ordered in place by EDP

## 2024-03-07 NOTE — ED Notes (Signed)
 Patient came up saying her pain is getting worse again. Let triage RN know of same.

## 2024-03-08 MED ORDER — MORPHINE SULFATE (PF) 4 MG/ML IV SOLN
4.0000 mg | Freq: Once | INTRAVENOUS | Status: AC
  Administered 2024-03-08: 4 mg via INTRAVENOUS
  Filled 2024-03-08: qty 1

## 2024-03-08 MED ORDER — SODIUM CHLORIDE 0.9 % IV BOLUS
1000.0000 mL | Freq: Once | INTRAVENOUS | Status: AC
  Administered 2024-03-08: 1000 mL via INTRAVENOUS

## 2024-03-08 MED ORDER — ONDANSETRON 4 MG PO TBDP: 4.0000 mg | ORAL_TABLET | Freq: Three times a day (TID) | ORAL | 0 refills | Status: AC | PRN

## 2024-03-08 MED ORDER — ONDANSETRON HCL 4 MG/2ML IJ SOLN
4.0000 mg | Freq: Once | INTRAMUSCULAR | Status: AC
  Administered 2024-03-08: 4 mg via INTRAVENOUS
  Filled 2024-03-08: qty 2

## 2024-03-08 NOTE — ED Notes (Signed)
 PO challenge completed. Pt tolerated food and drink without incident. Pt states that nausea has completely subsided.  MD notified.

## 2024-03-08 NOTE — Discharge Instructions (Addendum)
 Please take zofran  as needed for nausea.  Follow up closely with your pain management provider for further management of your abdominal and pelvic pain.

## 2024-03-08 NOTE — ED Provider Notes (Signed)
 " [place] EMERGENCY DEPARTMENT AT [facility] Provider Note   CSN: [id number] Arrival date & time: 03/07/24  1651     Patient presents with: Abdominal Pain   Krystal [name] is a 34 y.o. female.   The history is provided by the patient and medical records. No language interpreter was used.  Abdominal Pain    34 year old female with history of fibromyalgia, interstitial cystitis, asthma, depression, hypertension, thyroid  disease presenting with complaint of abdominal pain.  Patient states for the past 3 days she has had persistent nausea, vomiting, abdominal pain and pelvic pain.  Symptoms felt very similar to prior interstitial cystitis that she has had in the past.  Symptom has been ongoing and not adequately managed with her medication at home.  No associate fever chills no runny nose sneezing or coughing no chest pain or shortness of breath and no burning with urination.  Patient states she does follow-up with pain management and she did reach out to them who recommend coming to the ER for evaluation.  She was previously marijuana user but states she has has not used any marijuana for the past 20 days.  Patient denies having any vaginal bleeding or vaginal discharge.  Prior to Admission medications  Medication Sig Start Date End Date Taking? Authorizing Provider  acetaminophen  (TYLENOL ) 500 MG tablet Take 1,000 mg by mouth 3 (three) times daily as needed (for pain or headaches).    [provider]  albuterol  (VENTOLIN  HFA) 108 (90 Base) MCG/ACT inhaler Inhale 2 puffs into the lungs every 4 (four) hours as needed for wheezing or shortness of breath. 01/05/24   Ival Domino, FNP  bupivacaine ,PF, (MARCAINE ) 0.5 % SOLN injection Instill 15 mLs into bladder daily with heparin . Patient not taking: Reported on 01/11/2024 04/06/23     EPINEPHrine  0.3 mg/0.3 mL IJ SOAJ injection Inject 0.3 mg into the muscle as needed for anaphylaxis. 01/15/21   Luke Orlan HERO, DO   famotidine  (PEPCID ) 20 MG tablet Take 1 tablet (20 mg total) by mouth 2 (two) times daily. 01/11/24   May, Deanna J, NP  FLUoxetine  (PROZAC ) 20 MG capsule Take 3 capsules (60 mg total) by mouth daily. 07/22/23 11/25/26  Rothfuss, Jacob T, PA-C  guanFACINE (INTUNIV) 2 MG TB24 ER tablet Take 2 mg by mouth at bedtime.    [provider]  heparin  10000 UNIT/ML injection Instill 4 mLs (40,000 Units total) into the bladder daily with bupivacaine . Patient not taking: Reported on 01/11/2024 04/06/23     hydrocortisone  (CORTEF ) 10 MG tablet Take 5-10 mg by mouth See admin instructions. Take 10 mg by mouth in the morning and take 5 mg by mouth at bedtime.    [provider]  hydrOXYzine  (ATARAX ) 25 MG tablet Take 50 mg by mouth at bedtime.    [provider]  irbesartan -hydrochlorothiazide  (AVALIDE) 150-12.5 MG tablet Take 1 tablet by mouth in the morning and at bedtime. 09/03/23 01/11/28  Rothfuss, Jacob T, PA-C  levothyroxine  (SYNTHROID ) 150 MCG tablet Take 1 tablet (150 mcg total) by mouth daily at 6 (six) AM. 10/06/22 01/11/27  Elnora Ip, MD  linaclotide  (LINZESS ) 145 MCG CAPS capsule Take 1 capsule (145 mcg total) by mouth daily before breakfast. 09/03/23   Rothfuss, Jacob T, PA-C  NON FORMULARY Take 1 capsule by mouth See admin instructions. Beef organ capsules- Take 1 capsule by mouth once a day    [provider]  OLANZapine  (ZYPREXA ) 2.5 MG tablet Take 2.5 mg by mouth daily.  [provider]  OLANZapine  (ZYPREXA ) 7.5 MG tablet Take 7.5 mg by mouth at bedtime.    [provider]  ondansetron  (ZOFRAN -ODT) 4 MG disintegrating tablet Take 1 tablet (4 mg total) by mouth every 8 (eight) hours as needed for nausea or vomiting. 12/03/23   Franklyn Sid SAILOR, MD  oxyCODONE -acetaminophen  (PERCOCET) 5-325 MG tablet Take 1 tablet by mouth every 6 (six) hours as needed. Patient not taking: Reported on 01/11/2024 01/03/24   Patt Alm Macho, MD   pantoprazole  (PROTONIX ) 40 MG tablet Take 1 tablet (40 mg total) by mouth 2 (two) times daily as needed (for heartburn). Patient taking differently: Take 40 mg by mouth 2 (two) times daily. 10/01/23   Rothfuss, Jacob T, PA-C  pregabalin  (LYRICA ) 25 MG capsule Take 1 capsule (25 mg total) by mouth 2 (two) times daily. Patient taking differently: Take 25 mg by mouth daily. 11/03/23   Rothfuss, Jacob T, PA-C  promethazine  (PHENERGAN ) 25 MG suppository Place 1 suppository (25 mg total) rectally every 6 (six) hours as needed for nausea or vomiting. 01/03/24   Patt Alm Macho, MD  RASPBERRY PO Take 2 capsules by mouth See admin instructions. Raspberry Leaf capsules- Take 2 capsules by mouth once a day    [provider]  Syringe, Disposable, 20 ML MISC Use as directed for daily bladder instillations 04/06/23     traMADol  (ULTRAM ) 50 MG tablet Take 1 tablet (50 mg total) by mouth every 12 (twelve) hours as needed for moderate pain (pain score 4-6). Patient taking differently: Take 50 mg by mouth every 6 (six) hours as needed for moderate pain (pain score 4-6) or severe pain (pain score 7-10). 04/27/23   Ezenduka, Nkeiruka J, MD    Allergies: Bee venom, Coreg  [carvedilol ], Red dye #40 (allura red), Compazine  [prochlorperazine  maleate], Vicodin [hydrocodone -acetaminophen ], Cymbalta [duloxetine hcl], Ditropan  [oxybutynin ], Elavil [amitriptyline], Motrin  [ibuprofen ], Naprosyn  [naproxen ], Pyrimethamine, Sulfadoxine, Zestril  [lisinopril ], Nifedical xl [nifedipine ], Prochlorperazine , Reglan  [metoclopramide ], and Trandate  [labetalol ]    Review of Systems  Gastrointestinal:  Positive for abdominal pain.  All other systems reviewed and are negative.   Updated Vital Signs BP (!) 177/114 (BP Location: Left Arm)   Pulse 75   Temp 98 F (36.7 C) (Oral)   Resp 18   LMP 02/16/2024   SpO2 99%   Physical Exam Vitals and nursing note reviewed.  Constitutional:      General: She is not in acute distress.     Appearance: She is well-developed.     Comments: Appears uncomfortable but nontoxic  HENT:     Head: Atraumatic.  Eyes:     Conjunctiva/sclera: Conjunctivae normal.  Cardiovascular:     Rate and Rhythm: Normal rate and regular rhythm.     Pulses: Normal pulses.     Heart sounds: Normal heart sounds.  Pulmonary:     Effort: Pulmonary effort is normal.  Abdominal:     General: Bowel sounds are normal.     Palpations: Abdomen is soft.     Tenderness: There is abdominal tenderness (Mild diffuse tenderness no focal point tenderness).  Musculoskeletal:     Cervical back: Neck supple.     Comments: Left hand amputation, chronic  Skin:    Findings: No rash.  Neurological:     Mental Status: She is alert and oriented to person, place, and time.  Psychiatric:        Mood and Affect: Mood normal.     (all labs ordered are listed, but only abnormal results are displayed)  Labs Reviewed  COMPREHENSIVE METABOLIC PANEL WITH GFR - Abnormal; Notable for the following components:      Result Value   Glucose, Bld 100 (*)    All other components within normal limits  URINALYSIS, ROUTINE W REFLEX MICROSCOPIC - Abnormal; Notable for the following components:   Color, Urine STRAW (*)    Hgb urine dipstick SMALL (*)    Bacteria, UA FEW (*)    All other components within normal limits  CBC WITH DIFFERENTIAL/PLATELET  LIPASE, BLOOD  HCG, SERUM, QUALITATIVE    EKG: None  Radiology: CT ABDOMEN PELVIS W CONTRAST Result Date: 03/07/2024 CLINICAL DATA:  Lower abdominal pain for 2 days EXAM: CT ABDOMEN AND PELVIS WITH CONTRAST TECHNIQUE: Multidetector CT imaging of the abdomen and pelvis was performed using the standard protocol following bolus administration of intravenous contrast. RADIATION DOSE REDUCTION: This exam was performed according to the departmental dose-optimization program which includes automated exposure control, adjustment of the mA and/or kV according to patient size and/or use of  iterative reconstruction technique. CONTRAST:  OMNIPAQUE  IOHEXOL  300 MG/ML  SOLN COMPARISON:  01/20/2024 FINDINGS: Lower chest: No acute pleural or parenchymal lung disease. Hepatobiliary: No focal liver abnormality is seen. Status post cholecystectomy. No biliary dilatation. Pancreas: Unremarkable. No pancreatic ductal dilatation or surrounding inflammatory changes. Spleen: Normal in size without focal abnormality. Adrenals/Urinary Tract: Adrenal glands are unremarkable. Kidneys are normal, without renal calculi, focal lesion, or hydronephrosis. Bladder is unremarkable. Stomach/Bowel: No bowel obstruction or ileus. No bowel wall thickening or inflammatory change. Vascular/Lymphatic: No significant vascular findings are present. No enlarged abdominal or pelvic lymph nodes. Reproductive: Uterus and bilateral adnexa are unremarkable. Other: No free fluid or free intraperitoneal gas. No abdominal wall hernia. Musculoskeletal: No acute or destructive bony abnormalities. Reconstructed images demonstrate no additional findings. IMPRESSION: 1. No acute intra-abdominal or intrapelvic process. Electronically Signed   By: Ozell Daring M.D.   On: 03/07/2024 20:06     Procedures   Medications Ordered in the ED  ondansetron  (ZOFRAN -ODT) disintegrating tablet 8 mg (8 mg Oral Given 03/07/24 1731)  oxyCODONE  (Oxy IR/ROXICODONE ) immediate release tablet 5 mg (5 mg Oral Given 03/07/24 1729)  morphine  (PF) 4 MG/ML injection 4 mg (4 mg Intravenous Given 03/07/24 1753)  ondansetron  (ZOFRAN ) injection 4 mg (4 mg Intravenous Given 03/07/24 1750)  iohexol  (OMNIPAQUE ) 300 MG/ML solution 100 mL (100 mLs Intravenous Contrast Given 03/07/24 1832)                                    Medical Decision Making Risk Prescription drug management.   BP (!) 177/114 (BP Location: Left Arm)   Pulse 75   Temp 98 F (36.7 C) (Oral)   Resp 18   LMP 02/16/2024   SpO2 99%   27:83 AM   34 year old female with history of  fibromyalgia, interstitial cystitis, asthma, depression, hypertension, thyroid  disease presenting with complaint of abdominal pain.  Patient states for the past 3 days she has had persistent nausea, vomiting, abdominal pain and pelvic pain.  Symptoms felt very similar to prior interstitial cystitis that she has had in the past.  Symptom has been ongoing and not adequately managed with her medication at home.  No associate fever chills no runny nose sneezing or coughing no chest pain or shortness of breath and no burning with urination.  Patient states she does follow-up with pain management and she did reach out to them  who recommend coming to the ER for evaluation.  She was previously marijuana user but states she has has not used any marijuana for the past 20 days.  Patient denies having any vaginal bleeding or vaginal discharge.  Exam notable for diffuse abdominal tenderness but bowel sounds present.  Vitals are notable for elevated blood pressure of 177/114.  Patient is afebrile no hypoxia.  -Labs ordered, independently viewed and interpreted by me.  Labs remarkable for labs overall reassuring -The patient was maintained on a cardiac monitor.  I personally viewed and interpreted the cardiac monitored which showed an underlying rhythm of: SR -Imaging independently viewed and interpreted by me and I agree with radiologist's interpretation.  Result remarkable for abd/pelvis CT unremarkable -This patient presents to the ED for concern of abd pain, this involves an extensive number of treatment options, and is a complaint that carries with it a high risk of complications and morbidity.  The differential diagnosis includes pancreatitis, colitis, UTI, cystitis, kidney stones, CHS -Co morbidities that complicate the patient evaluation includes fibromyalgia, interstitial cystitis, depression, marijuana use -Treatment includes IVF, zofran , morphine  -Reevaluation of the patient after these medicines showed that  the patient improved -PCP office notes or outside notes reviewed -Escalation to admission/observation considered: patients feels much better, is comfortable with discharge, and will follow up with PCP -Prescription medication considered, patient comfortable with home medication -Social Determinant of Health considered which includes depression, lack of transportation      Final diagnoses:  Chronic abdominal pain  Nausea and vomiting, unspecified vomiting type    ED Discharge Orders          Ordered    ondansetron  (ZOFRAN -ODT) 4 MG disintegrating tablet  Every 8 hours PRN        03/08/24 1015               Nivia Colon, PA-C 03/08/24 1016  "

## 2024-06-06 ENCOUNTER — Ambulatory Visit (HOSPITAL_BASED_OUTPATIENT_CLINIC_OR_DEPARTMENT_OTHER): Payer: MEDICAID | Admitting: Student
